# Patient Record
Sex: Female | Born: 1975 | Race: White | Hispanic: No | Smoking: Never smoker
Health system: Southern US, Community
[De-identification: ages and names within clinical notes are randomized; demographics above are authoritative.]

## PROBLEM LIST (undated history)

## (undated) ENCOUNTER — Inpatient Hospital Stay (HOSPITAL_COMMUNITY): Payer: Self-pay

## (undated) DIAGNOSIS — F32A Depression, unspecified: Secondary | ICD-10-CM

## (undated) DIAGNOSIS — I469 Cardiac arrest, cause unspecified: Secondary | ICD-10-CM

## (undated) DIAGNOSIS — N83202 Unspecified ovarian cyst, left side: Secondary | ICD-10-CM

## (undated) DIAGNOSIS — R569 Unspecified convulsions: Secondary | ICD-10-CM

## (undated) DIAGNOSIS — Z5189 Encounter for other specified aftercare: Secondary | ICD-10-CM

## (undated) DIAGNOSIS — F329 Major depressive disorder, single episode, unspecified: Secondary | ICD-10-CM

## (undated) DIAGNOSIS — F1010 Alcohol abuse, uncomplicated: Secondary | ICD-10-CM

## (undated) DIAGNOSIS — K760 Fatty (change of) liver, not elsewhere classified: Secondary | ICD-10-CM

## (undated) DIAGNOSIS — N83201 Unspecified ovarian cyst, right side: Secondary | ICD-10-CM

## (undated) DIAGNOSIS — K6289 Other specified diseases of anus and rectum: Secondary | ICD-10-CM

## (undated) DIAGNOSIS — F419 Anxiety disorder, unspecified: Secondary | ICD-10-CM

## (undated) DIAGNOSIS — D649 Anemia, unspecified: Secondary | ICD-10-CM

## (undated) HISTORY — DX: Encounter for other specified aftercare: Z51.89

## (undated) HISTORY — PX: APPENDECTOMY: SHX54

## (undated) HISTORY — DX: Major depressive disorder, single episode, unspecified: F32.9

## (undated) HISTORY — DX: Anemia, unspecified: D64.9

## (undated) HISTORY — PX: OTHER SURGICAL HISTORY: SHX169

## (undated) HISTORY — PX: OVARIAN CYST REMOVAL: SHX89

## (undated) HISTORY — DX: Fatty (change of) liver, not elsewhere classified: K76.0

## (undated) HISTORY — DX: Depression, unspecified: F32.A

## (undated) HISTORY — DX: Anxiety disorder, unspecified: F41.9

---

## 2013-09-17 ENCOUNTER — Encounter (HOSPITAL_COMMUNITY): Payer: Self-pay | Admitting: Emergency Medicine

## 2013-09-17 ENCOUNTER — Emergency Department (HOSPITAL_COMMUNITY)

## 2013-09-17 ENCOUNTER — Emergency Department (HOSPITAL_COMMUNITY)
Admission: EM | Admit: 2013-09-17 | Discharge: 2013-09-18 | Disposition: A | Discharge status: Home / Self Care | Attending: Emergency Medicine | Admitting: Emergency Medicine

## 2013-09-17 DIAGNOSIS — K6289 Other specified diseases of anus and rectum: Secondary | ICD-10-CM | POA: Insufficient documentation

## 2013-09-17 DIAGNOSIS — Z7982 Long term (current) use of aspirin: Secondary | ICD-10-CM | POA: Insufficient documentation

## 2013-09-17 DIAGNOSIS — Z880 Allergy status to penicillin: Secondary | ICD-10-CM | POA: Insufficient documentation

## 2013-09-17 DIAGNOSIS — Z3202 Encounter for pregnancy test, result negative: Secondary | ICD-10-CM | POA: Insufficient documentation

## 2013-09-17 DIAGNOSIS — Z79899 Other long term (current) drug therapy: Secondary | ICD-10-CM | POA: Insufficient documentation

## 2013-09-17 DIAGNOSIS — R109 Unspecified abdominal pain: Secondary | ICD-10-CM | POA: Insufficient documentation

## 2013-09-17 DIAGNOSIS — K625 Hemorrhage of anus and rectum: Secondary | ICD-10-CM | POA: Insufficient documentation

## 2013-09-17 DIAGNOSIS — Z8742 Personal history of other diseases of the female genital tract: Secondary | ICD-10-CM | POA: Insufficient documentation

## 2013-09-17 DIAGNOSIS — G40909 Epilepsy, unspecified, not intractable, without status epilepticus: Secondary | ICD-10-CM | POA: Insufficient documentation

## 2013-09-17 HISTORY — DX: Unspecified ovarian cyst, right side: N83.202

## 2013-09-17 HISTORY — DX: Unspecified ovarian cyst, right side: N83.201

## 2013-09-17 HISTORY — DX: Unspecified convulsions: R56.9

## 2013-09-17 HISTORY — DX: Other specified diseases of anus and rectum: K62.89

## 2013-09-17 LAB — COMPREHENSIVE METABOLIC PANEL
ALK PHOS: 61 U/L (ref 39–117)
ALT: 15 U/L (ref 0–35)
AST: 24 U/L (ref 0–37)
Albumin: 4 g/dL (ref 3.5–5.2)
BILIRUBIN TOTAL: 0.7 mg/dL (ref 0.3–1.2)
BUN: 4 mg/dL — AB (ref 6–23)
CO2: 22 meq/L (ref 19–32)
Calcium: 8.3 mg/dL — ABNORMAL LOW (ref 8.4–10.5)
Chloride: 104 mEq/L (ref 96–112)
Creatinine, Ser: 0.45 mg/dL — ABNORMAL LOW (ref 0.50–1.10)
GFR calc non Af Amer: >90 mL/min (ref >90)
GLUCOSE: 111 mg/dL — AB (ref 70–99)
Potassium: 3.3 mEq/L — ABNORMAL LOW (ref 3.7–5.3)
Sodium: 142 mEq/L (ref 137–147)
Total Protein: 7 g/dL (ref 6.0–8.3)

## 2013-09-17 LAB — PREGNANCY, URINE: Preg Test, Ur: NEGATIVE

## 2013-09-17 LAB — CBC WITH DIFFERENTIAL/PLATELET
BASOS PCT: 1 % (ref 0–1)
Basophils Absolute: 0 10*3/uL (ref 0.0–0.1)
EOS ABS: 0 10*3/uL (ref 0.0–0.7)
EOS PCT: 0 % (ref 0–5)
HEMATOCRIT: 33 % — AB (ref 36.0–46.0)
Hemoglobin: 11.2 g/dL — ABNORMAL LOW (ref 12.0–15.0)
Lymphocytes Relative: 48 % — ABNORMAL HIGH (ref 12–46)
Lymphs Abs: 1.5 10*3/uL (ref 0.7–4.0)
MCH: 31.2 pg (ref 26.0–34.0)
MCHC: 33.9 g/dL (ref 30.0–36.0)
MCV: 91.9 fL (ref 78.0–100.0)
MONO ABS: 0.3 10*3/uL (ref 0.1–1.0)
Monocytes Relative: 8 % (ref 3–12)
Neutro Abs: 1.4 10*3/uL — ABNORMAL LOW (ref 1.7–7.7)
Neutrophils Relative %: 43 % (ref 43–77)
Platelets: 179 10*3/uL (ref 150–400)
RBC: 3.59 MIL/uL — ABNORMAL LOW (ref 3.87–5.11)
RDW: 14.8 % (ref 11.5–15.5)
WBC: 3.1 10*3/uL — ABNORMAL LOW (ref 4.0–10.5)

## 2013-09-17 LAB — URINE MICROSCOPIC-ADD ON

## 2013-09-17 LAB — URINALYSIS, ROUTINE W REFLEX MICROSCOPIC
BILIRUBIN URINE: NEGATIVE
GLUCOSE, UA: NEGATIVE mg/dL
Ketones, ur: NEGATIVE mg/dL
Leukocytes, UA: NEGATIVE
Nitrite: NEGATIVE
PROTEIN: NEGATIVE mg/dL
Specific Gravity, Urine: 1.022 (ref 1.005–1.030)
Urobilinogen, UA: 1 mg/dL (ref 0.0–1.0)
pH: 7 (ref 5.0–8.0)

## 2013-09-17 LAB — LIPASE, BLOOD: Lipase: 37 U/L (ref 11–59)

## 2013-09-17 MED ORDER — ONDANSETRON HCL 4 MG/2ML IJ SOLN
4.0000 mg | Freq: Once | INTRAMUSCULAR | Status: AC
  Administered 2013-09-17: 4 mg via INTRAVENOUS
  Filled 2013-09-17: qty 2

## 2013-09-17 MED ORDER — IOHEXOL 300 MG/ML  SOLN
100.0000 mL | Freq: Once | INTRAMUSCULAR | Status: AC | PRN
  Administered 2013-09-17: 100 mL via INTRAVENOUS

## 2013-09-17 MED ORDER — IOHEXOL 300 MG/ML  SOLN
50.0000 mL | Freq: Once | INTRAMUSCULAR | Status: AC | PRN
  Administered 2013-09-17: 50 mL via ORAL

## 2013-09-17 MED ORDER — HYDROMORPHONE HCL PF 1 MG/ML IJ SOLN
1.0000 mg | Freq: Once | INTRAMUSCULAR | Status: AC
  Administered 2013-09-17: 1 mg via INTRAVENOUS
  Filled 2013-09-17: qty 1

## 2013-09-17 MED ORDER — OXYCODONE-ACETAMINOPHEN 5-325 MG PO TABS: 1.0000 | ORAL_TABLET | Freq: Four times a day (QID) | ORAL | Status: DC | PRN

## 2013-09-17 MED ORDER — CIPROFLOXACIN HCL 500 MG PO TABS: 500.0000 mg | ORAL_TABLET | Freq: Two times a day (BID) | ORAL | Status: DC

## 2013-09-17 MED ORDER — SODIUM CHLORIDE 0.9 % IV BOLUS (SEPSIS)
1000.0000 mL | Freq: Once | INTRAVENOUS | Status: AC
  Administered 2013-09-17: 1000 mL via INTRAVENOUS

## 2013-09-17 MED ORDER — ONDANSETRON 4 MG PO TBDP: ORAL_TABLET | ORAL | Status: DC

## 2013-09-17 MED ORDER — LORAZEPAM 2 MG/ML IJ SOLN
0.5000 mg | Freq: Once | INTRAMUSCULAR | Status: AC
  Administered 2013-09-17: 0.5 mg via INTRAVENOUS
  Filled 2013-09-17: qty 1

## 2013-09-17 MED ORDER — HYDROMORPHONE HCL PF 1 MG/ML IJ SOLN
1.0000 mg | Freq: Once | INTRAMUSCULAR | Status: AC
Start: 2013-09-17 — End: 2013-09-17
  Administered 2013-09-17: 1 mg via INTRAVENOUS
  Filled 2013-09-17: qty 1

## 2013-09-17 NOTE — Discharge Instructions (Signed)
Follow up with Dr. Teena Irani or any of his partners at [facility]   [phone]

## 2013-09-17 NOTE — ED Provider Notes (Signed)
CSN: [id number]     Arrival date & time 09/17/13  1726 History   First MD Initiated Contact with Patient 09/17/13 1926     Chief Complaint  Patient presents with  . Abdominal Pain     (Consider location/radiation/quality/duration/timing/severity/associated sxs/prior Treatment) Patient is a 38 y.o. female presenting with abdominal pain. The history is provided by the patient (the pt complains of abd pain and rectal bleeding.  she was worked up for this before with colonoscopy.  no dx according to pt).  Abdominal Pain Pain location:  Suprapubic Pain quality: aching   Pain radiates to:  Does not radiate Pain severity:  Moderate Onset quality:  Gradual Timing:  Constant Progression:  Waxing and waning Chronicity:  Recurrent Associated symptoms: no chest pain, no cough, no diarrhea, no fatigue and no hematuria     Past Medical History  Diagnosis Date  . Proctitis   . Cysts of both ovaries   . Seizures    Past Surgical History  Procedure Laterality Date  . Ovarian cyst removal     No family history on file. History  Substance Use Topics  . Smoking status: Never Smoker   . Smokeless tobacco: Not on file  . Alcohol Use: Yes   OB History   Grav Para Term Preterm Abortions TAB SAB Ect Mult Living                 Review of Systems  Constitutional: Negative for appetite change and fatigue.  HENT: Negative for congestion, ear discharge and sinus pressure.   Eyes: Negative for discharge.  Respiratory: Negative for cough.   Cardiovascular: Negative for chest pain.  Gastrointestinal: Positive for abdominal pain, anal bleeding and rectal pain. Negative for diarrhea.  Genitourinary: Negative for frequency and hematuria.  Musculoskeletal: Negative for back pain.  Skin: Negative for rash.  Neurological: Negative for seizures and headaches.  Psychiatric/Behavioral: Negative for hallucinations.      Allergies  Morphine and related; Penicillins; and Tramadol  Home Medications    Current Outpatient Rx  Name  Route  Sig  Dispense  Refill  . aspirin 325 MG tablet   Oral   Take 650 mg by mouth every 4 (four) hours as needed for mild pain.         . citalopram (CELEXA) 20 MG tablet   Oral   Take 20 mg by mouth daily.         Marland Kitchen gabapentin (NEURONTIN) 300 MG capsule   Oral   Take 300 mg by mouth 3 (three) times daily.         . medroxyPROGESTERone (DEPO-PROVERA) 150 MG/ML injection   Intramuscular   Inject 150 mg into the muscle every 3 (three) months.         . traZODone (DESYREL) 100 MG tablet   Oral   Take 100 mg by mouth at bedtime as needed for sleep.         . ciprofloxacin (CIPRO) 500 MG tablet   Oral   Take 1 tablet (500 mg total) by mouth 2 (two) times daily. One po bid x 7 days   14 tablet   0   . ondansetron (ZOFRAN ODT) 4 MG disintegrating tablet      4mg  ODT q4-6 hours prn nausea/vomit   15 tablet   0   . oxyCODONE-acetaminophen (PERCOCET/ROXICET) 5-325 MG per tablet   Oral   Take 1 tablet by mouth every 6 (six) hours as needed for severe pain.   30 tablet  0    BP 146/95  Pulse 85  Temp(Src) 97.7 F (36.5 C) (Oral)  Resp 20  SpO2 97%  LMP 08/27/2013 Physical Exam  Constitutional: She is oriented to person, place, and time. She appears well-developed.  HENT:  Head: Normocephalic.  Eyes: Conjunctivae and EOM are normal. No scleral icterus.  Neck: Neck supple. No thyromegaly present.  Cardiovascular: Normal rate and regular rhythm.  Exam reveals no gallop and no friction rub.   No murmur heard. Pulmonary/Chest: No stridor. She has no wheezes. She has no rales. She exhibits no tenderness.  Abdominal: She exhibits no distension. There is no tenderness. There is no rebound.  Genitourinary:  Anus tender with hemmorhoids.  Could not put finer in anus 2nd to pain   Musculoskeletal: Normal range of motion. She exhibits no edema.  Lymphadenopathy:    She has no cervical adenopathy.  Neurological: She is oriented to  person, place, and time. She exhibits normal muscle tone. Coordination normal.  Skin: No rash noted. No erythema.  Psychiatric: She has a normal mood and affect. Her behavior is normal.    ED Course  Procedures (including critical care time) Labs Review Labs Reviewed  CBC WITH DIFFERENTIAL - Abnormal; Notable for the following:    WBC 3.1 (*)    RBC 3.59 (*)    Hemoglobin 11.2 (*)    HCT 33.0 (*)    Neutro Abs 1.4 (*)    Lymphocytes Relative 48 (*)    All other components within normal limits  COMPREHENSIVE METABOLIC PANEL - Abnormal; Notable for the following:    Potassium 3.3 (*)    Glucose, Bld 111 (*)    BUN 4 (*)    Creatinine, Ser 0.45 (*)    Calcium 8.3 (*)    All other components within normal limits  URINALYSIS, ROUTINE W REFLEX MICROSCOPIC - Abnormal; Notable for the following:    APPearance CLOUDY (*)    Hgb urine dipstick MODERATE (*)    All other components within normal limits  URINE MICROSCOPIC-ADD ON - Abnormal; Notable for the following:    Squamous Epithelial / LPF MANY (*)    All other components within normal limits  LIPASE, BLOOD  PREGNANCY, URINE   Imaging Review Ct Abdomen Pelvis W Contrast  09/17/2013   CLINICAL DATA:  . History of ovarian cysts and proctitis. Rectal bleeding.  EXAM: CT ABDOMEN AND PELVIS WITH CONTRAST  TECHNIQUE: Multidetector CT imaging of the abdomen and pelvis was performed using the standard protocol following bolus administration of intravenous contrast.  CONTRAST:  136mL OMNIPAQUE IOHEXOL 300 MG/ML  SOLN  COMPARISON:  None.  FINDINGS: BODY WALL: Diastasis rectus.  LOWER CHEST: Unremarkable.  ABDOMEN/PELVIS:  Liver: Suspect fatty infiltration of the liver, especially around the gallbladder fossa.  Biliary: No evidence of biliary obstruction or stone.  Pancreas: Unremarkable.  Spleen: Unremarkable.  Adrenals: Unremarkable.  Kidneys and ureters: No hydronephrosis or stone.  Bladder: Unremarkable.  Reproductive: Small volume fluid in the  upper vagina.  Bowel: No obstruction. No evidence of proctitis currently. Normal appendix.  Retroperitoneum: No mass or adenopathy.  Peritoneum: Small volume fluid within the pelvic peritoneal recesses, usually physiologic.  Vascular: No acute abnormality.  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  IMPRESSION: 1. No acute intra-abdominal abnormality. 2. L5 pars defects with significant anterolisthesis.   Electronically Signed   By: Jorje Guild M.D.   On: 09/17/2013 23:25  EKG Interpretation None      MDM   Final diagnoses:  Abdominal pain    Pt to follow up with gi    Maudry Diego, MD 09/17/13 2358

## 2013-09-17 NOTE — ED Notes (Signed)
Pt from home reports that she has ovarian cysts and Proctitis. Pt c/o severe pain and rectal bleeding x2 days. Pt difficult to triage and not compliant with answers. Pt is A&O

## 2013-09-18 MED ORDER — ONDANSETRON HCL 4 MG/2ML IJ SOLN
4.0000 mg | Freq: Once | INTRAMUSCULAR | Status: AC
  Administered 2013-09-18: 4 mg via INTRAVENOUS
  Filled 2013-09-18: qty 2

## 2013-09-21 ENCOUNTER — Ambulatory Visit (INDEPENDENT_AMBULATORY_CARE_PROVIDER_SITE_OTHER): Admitting: Gynecology

## 2013-09-21 ENCOUNTER — Other Ambulatory Visit: Payer: Self-pay | Admitting: Gynecology

## 2013-09-21 ENCOUNTER — Ambulatory Visit (INDEPENDENT_AMBULATORY_CARE_PROVIDER_SITE_OTHER): Admitting: Emergency Medicine

## 2013-09-21 ENCOUNTER — Ambulatory Visit (INDEPENDENT_AMBULATORY_CARE_PROVIDER_SITE_OTHER)

## 2013-09-21 ENCOUNTER — Encounter: Payer: Self-pay | Admitting: Gynecology

## 2013-09-21 VITALS — BP 114/76 | Ht 66.0 in | Wt 133.0 lb

## 2013-09-21 VITALS — BP 102/64 | HR 84 | Temp 97.6°F | Resp 16 | Ht 66.0 in | Wt 131.0 lb

## 2013-09-21 DIAGNOSIS — N949 Unspecified condition associated with female genital organs and menstrual cycle: Secondary | ICD-10-CM

## 2013-09-21 DIAGNOSIS — N920 Excessive and frequent menstruation with regular cycle: Secondary | ICD-10-CM

## 2013-09-21 DIAGNOSIS — D649 Anemia, unspecified: Secondary | ICD-10-CM

## 2013-09-21 DIAGNOSIS — R102 Pelvic and perineal pain unspecified side: Secondary | ICD-10-CM

## 2013-09-21 DIAGNOSIS — K625 Hemorrhage of anus and rectum: Secondary | ICD-10-CM

## 2013-09-21 DIAGNOSIS — N946 Dysmenorrhea, unspecified: Secondary | ICD-10-CM

## 2013-09-21 DIAGNOSIS — K6289 Other specified diseases of anus and rectum: Secondary | ICD-10-CM | POA: Insufficient documentation

## 2013-09-21 DIAGNOSIS — Z8742 Personal history of other diseases of the female genital tract: Secondary | ICD-10-CM | POA: Insufficient documentation

## 2013-09-21 LAB — POCT CBC
Granulocyte percent: 46.5 %G (ref 37–80)
HEMATOCRIT: 34.7 % — AB (ref 37.7–47.9)
Hemoglobin: 10.9 g/dL — AB (ref 12.2–16.2)
LYMPH, POC: 1.2 (ref 0.6–3.4)
MCH: 30.1 pg (ref 27–31.2)
MCHC: 31.4 g/dL — AB (ref 31.8–35.4)
MCV: 95.9 fL (ref 80–97)
MID (cbc): 0.2 (ref 0–0.9)
MPV: 6.6 fL (ref 0–99.8)
POC GRANULOCYTE: 1.2 — AB (ref 2–6.9)
POC LYMPH %: 46.1 % (ref 10–50)
POC MID %: 7.4 %M (ref 0–12)
Platelet Count, POC: 186 10*3/uL (ref 142–424)
RBC: 3.62 M/uL — AB (ref 4.04–5.48)
RDW, POC: 15.9 %
WBC: 2.5 10*3/uL — AB (ref 4.6–10.2)

## 2013-09-21 MED ORDER — KETOROLAC TROMETHAMINE 10 MG PO TABS: 10.0000 mg | ORAL_TABLET | Freq: Four times a day (QID) | ORAL | Status: DC | PRN

## 2013-09-21 MED ORDER — KETOROLAC TROMETHAMINE 60 MG/2ML IJ SOLN: 60.0000 mg | Freq: Once | INTRAMUSCULAR | Status: DC

## 2013-09-21 MED ORDER — KETOROLAC TROMETHAMINE 60 MG/2ML IM SOLN
60.0000 mg | Freq: Once | INTRAMUSCULAR | Status: AC
  Administered 2013-09-21: 60 mg via INTRAMUSCULAR

## 2013-09-21 MED ORDER — NORETHIN ACE-ETH ESTRAD-FE 1-20 MG-MCG PO TABS: 1.0000 | ORAL_TABLET | Freq: Every day | ORAL | Status: DC

## 2013-09-21 NOTE — Patient Instructions (Addendum)
Transvaginal Ultrasound Transvaginal ultrasound is a pelvic ultrasound, using a metal probe that is placed in the vagina, to look at a women's female organs. Transvaginal ultrasound is a method of seeing inside the pelvis of a woman. The ultrasound machine sends out sound waves from the transducer (probe). These sound waves bounce off body structures (like an echo) to create a picture. The picture shows up on a monitor. It is called transvaginal because the probe is inserted into the vagina. There should be very little discomfort from the vaginal probe. This test can also be used during pregnancy. Endovaginal ultrasound is another name for a transvaginal ultrasound. In a transabdominal ultrasound, the probe is placed on the outside of the belly. This method gives pictures that are lower quality than pictures from the transvaginal technique. Transvaginal ultrasound is used to look for problems of the female genital tract. Some such problems include:  Infertility problems.  Congenital (birth defect) malformations of the uterus and ovaries.  Tumors in the uterus.  Abnormal bleeding.  Ovarian tumors and cysts.  Abscess (inflamed tissue around pus) in the pelvis.  Unexplained abdominal or pelvic pain.  Pelvic infection. DURING PREGNANCY, TRANSVAGINAL ULTRASOUND MAY BE USED TO LOOK AT:  Normal pregnancy.  Ectopic pregnancy (pregnancy outside the uterus).  Fetal heartbeat.  Abnormalities in the pelvis, that are not seen well with transabdominal ultrasound.  Suspected twins or multiples.  Impending miscarriage.  Problems with the cervix (incompetent cervix, not able to stay closed and hold the baby).  When doing an amniocentesis (removing fluid from the pregnancy sac, for testing).  Looking for abnormalities of the baby.  Checking the growth, development, and age of the fetus.  Measuring the amount of fluid in the amniotic sac.  When doing an external version of the baby (moving  baby into correct position).  Evaluating the baby for problems in high risk pregnancies (biophysical profile).  Suspected fetal demise (death). Sometimes a special ultrasound method called Saline Infusion Sonography (SIS) is used for a more accurate look at the uterus. Sterile saline (salt water) is injected into the uterus of non-pregnant patients to see the inside of the uterus better. SIS is not used on pregnant women. The vaginal probe can also assist in obtaining biopsies of abnormal areas, in draining fluid from cysts on the ovary, and in finding IUDs (intrauterine device, birth control) that cannot be located. PREPARATION FOR TEST A transvaginal ultrasound is done with the bladder empty. The transabdominal ultrasound is done with your bladder full. You may be asked to drink several glasses of water before that exam. Sometimes, a transabdominal ultrasound is done just after a transvaginal ultrasound, to look at organs in your abdomen. PROCEDURE  You will lie down on a table, with your knees bent and your feet in foot holders. The probe is covered with a condom. A sterile lubricant is put into the vagina and on the probe. The lubricant helps transmit the sound waves and avoid irritating the vagina. Your caregiver will move the probe inside the vaginal cavity to scan the pelvic structures. A normal test will show a normal pelvis and normal contents. An abnormal test will show abnormalities of the pelvis, placenta, or baby. ABNORMAL RESULTS MAY BE DUE TO:  Growths or tumors in the:  Uterus.  Ovaries.  Vagina.  Other pelvic structures.  Non-cancerous growths of the uterus and ovaries.  Twisting of the ovary, cutting off blood supply to the ovary (ovarian torsion).  Areas of infection, including:  Pelvic  inflammatory disease.  Abscess in the pelvis.  Locating an IUD. PROBLEMS FOUND IN PREGNANT WOMEN MAY INCLUDE:  Ectopic pregnancy (pregnancy outside the uterus).  Multiple  pregnancies.  Early dilation (opening) of the cervix. This may indicate an incompetent cervix and early delivery.  Impending miscarriage.  Fetal death.  Problems with the placenta, including:  Placenta has grown over the opening of the womb (placenta previa).  Placenta has separated early in the womb (placental abruption).  Placenta grows into the muscle of the uterus (placenta accreta).  Tumors of pregnancy, including gestational trophoblastic disease. This is an abnormal pregnancy, with no fetus. The uterus is filled with many grape-like cysts that could sometimes be cancerous.  Incorrect position of the fetus (breech, vertex).  Intrauterine fetal growth retardation (IUGR) (poor growth in the womb).  Fetal abnormalities or infection. RISKS AND COMPLICATIONS There are no known risks to the ultrasound procedure. There is no X-ray used when doing an ultrasound. Document Released: 06/17/2004 Document Revised: 09/28/2011 Document Reviewed: 06/05/2009 [facility] Patient Information 2014 [place], [place]. Diagnostic Laparoscopy Laparoscopy is a surgical procedure. It is used to diagnose and treat diseases inside the belly (abdomen). It is usually a brief, common, and relatively simple procedure. The laparoscopeis a thin, lighted, pencil-sized instrument. It is like a telescope. It is inserted into your abdomen through a small cut (incision). Your caregiver can look at the organs inside your body through this instrument. He or she can see if there is anything abnormal. Laparoscopy can be done either in a hospital or outpatient clinic. You may be given a mild sedative to help you relax before the procedure. Once in the operating room, you will be given a drug to make you sleep (general anesthesia). Laparoscopy usually lasts less than 1 hour. After the procedure, you will be monitored in a recovery area until you are stable and doing well. Once you are home, it will take 2 to 3 days to fully  recover. RISKS AND COMPLICATIONS  Laparoscopy has relatively few risks. Your caregiver will discuss the risks with you before the procedure. Some problems that can occur include:  Infection.  Bleeding.  Damage to other organs.  Anesthetic side effects. PROCEDURE Once you receive anesthesia, your surgeon inflates the abdomen with a harmless gas (carbon dioxide). This makes the organs easier to see. The laparoscope is inserted into the abdomen through a small incision. This allows your surgeon to see into the abdomen. Other small instruments are also inserted into the abdomen through other small openings. Many surgeons attach a video camera to the laparoscope to enlarge the view. During a diagnostic laparoscopy, the surgeon may be looking for inflammation, infection, or cancer. Your surgeon may take tissue samples(biopsies). The samples are sent to a specialist in looking at cells and tissue samples (pathologist). The pathologist examines them under a microscope. Biopsies can help to diagnose or confirm a disease. AFTER THE PROCEDURE   The gas is released from inside the abdomen.  The incisions are closed with stitches (sutures). Because these incisions are small (usually less than 1/2 inch), there is usually minimal discomfort after the procedure. There may be some mild discomfort in the throat. This is from the tube placed in the throat while you were sleeping. You may have some mild abdominal discomfort. There may also be discomfort from the instrument placement incisions in the abdomen.  The recovery time is shortened as long as there are no complications.  You will rest in a recovery room until stable and  doing well. As long as there are no complications, you may be allowed to go home. FINDING OUT THE RESULTS OF YOUR TEST Not all test results are available during your visit. If your test results are not back during the visit, make an appointment with your caregiver to find out the  results. Do not assume everything is normal if you have not heard from your caregiver or the medical facility. It is important for you to follow up on all of your test results. HOME CARE INSTRUCTIONS   Take all medicines as directed.  Only take over-the-counter or prescription medicines for pain, discomfort, or fever as directed by your caregiver.  Resume daily activities as directed.  Showers are preferred over baths.  You may resume sexual activities in 1 week or as directed.  Do not drive while taking narcotics. SEEK MEDICAL CARE IF:   There is increasing abdominal pain.  There is new pain in the shoulders (shoulder strap areas).  You feel lightheaded or faint.  You have the chills.  You or your child has an oral temperature above 102 F (38.9 C).  There is pus-like (purulent) drainage from any of the wounds.  You are unable to pass gas or have a bowel movement.  You feel sick to your stomach (nauseous) or throw up (vomit). MAKE SURE YOU:   Understand these instructions.  Will watch your condition.  Will get help right away if you are not doing well or get worse. Document Released: 10/12/2000 Document Revised: 10/31/2012 Document Reviewed: 07/06/2007 [facility] Patient Information 2014 [place], [place]. Oral Contraception Use Oral contraceptive pills (OCPs) are medicines taken to prevent pregnancy. OCPs work by preventing the ovaries from releasing eggs. The hormones in OCPs also cause the cervical mucus to thicken, preventing the sperm from entering the uterus. The hormones also cause the uterine lining to become thin, not allowing a fertilized egg to attach to the inside of the uterus. OCPs are highly effective when taken exactly as prescribed. However, OCPs do not prevent sexually transmitted diseases (STDs). Safe sex practices, such as using condoms along with an OCP, can help prevent STDs. Before taking OCPs, you may have a physical exam and Pap test. Your health care  provider may also order blood tests if necessary. Your health care provider will make sure you are a good candidate for oral contraception. Discuss with your health care provider the possible side effects of the OCP you may be prescribed. When starting an OCP, it can take 2 to 3 months for the body to adjust to the changes in hormone levels in your body.  HOW TO TAKE ORAL CONTRACEPTIVE PILLS Your health care provider may advise you on how to start taking the first cycle of OCPs. Otherwise, you can:   Start on day 1 of your menstrual period. You will not need any backup contraceptive protection with this start time.   Start on the first Sunday after your menstrual period or the day you get your prescription. In these cases, you will need to use backup contraceptive protection for the first week.   Start the pill at any time of your cycle. If you take the pill within 5 days of the start of your period, you are protected against pregnancy right away. In this case, you will not need a backup form of birth control. If you start at any other time of your menstrual cycle, you will need to use another form of birth control for 7 days. If your OCP  is the type called a minipill, it will protect you from pregnancy after taking it for 2 days (48 hours). After you have started taking OCPs:   If you forget to take 1 pill, take it as soon as you remember. Take the next pill at the regular time.   If you miss 2 or more pills, call your health care provider because different pills have different instructions for missed doses. Use backup birth control until your next menstrual period starts.   If you use a 28-day pack that contains inactive pills and you miss 1 of the last 7 pills (pills with no hormones), it will not matter. Throw away the rest of the nonhormone pills and start a new pill pack.  No matter which day you start the OCP, you will always start a new pack on that same day of the week. Have an extra  pack of OCPs and a backup contraceptive method available in case you miss some pills or lose your OCP pack.  HOME CARE INSTRUCTIONS   Do not smoke.   Always use a condom to protect against STDs. OCPs do not protect against STDs.   Use a calendar to mark your menstrual period days.   Read the information and directions that came with your OCP. Talk to your health care provider if you have questions.  SEEK MEDICAL CARE IF:   You develop nausea and vomiting.   You have abnormal vaginal discharge or bleeding.   You develop a rash.   You miss your menstrual period.   You are losing your hair.   You need treatment for mood swings or depression.   You get dizzy when taking the OCP.   You develop acne from taking the OCP.   You become pregnant.  SEEK IMMEDIATE MEDICAL CARE IF:   You develop chest pain.   You develop shortness of breath.   You have an uncontrolled or severe headache.   You develop numbness or slurred speech.   You develop visual problems.   You develop pain, redness, and swelling in the legs.  Document Released: 06/25/2011 Document Revised: 03/08/2013 Document Reviewed: 12/25/2012 [facility] Patient Information 2014 [place], [place]. Endometriosis Endometriosis is a condition in which the tissue that lines the uterus (endometrium) grows outside of its normal location. The tissue may grow in many locations close to the uterus, but it commonly grows on the ovaries, fallopian tubes, vagina, or bowel. Because the uterus expels, or sheds, its lining every menstrual cycle, there is bleeding wherever the endometrial tissue is located. This can cause pain because blood is irritating to tissues not normally exposed to it.  CAUSES  The cause of endometriosis is not known.  SIGNS AND SYMPTOMS  Often, there are no symptoms. When symptoms are present, they can vary with the location of the displaced tissue. Various symptoms can occur at different times.  Although symptoms occur mainly during a woman's menstrual period, they can also occur midcycle and usually stop with menopause. Some people may go months with no symptoms at all. Symptoms may include:   Back or abdominal pain.   Heavier bleeding during periods.   Pain during intercourse.   Painful bowel movements.   Infertility. DIAGNOSIS  Your health care provider will do a physical exam and ask about your symptoms. Various tests may be done, such as:   Blood tests and urine tests. These are done to help rule out other problems.   Ultrasound. This test is done to look for  abnormal tissue.   An X-ray of the lower bowel (barium enema).  Laparoscopy. In this procedure, a thin, lighted tube with a tiny camera on the end (laparoscope) is inserted into your abdomen. This helps your health care provider look for abnormal tissue to confirm the diagnosis. The health care provider may also remove a small piece of tissue (biopsy) from any abnormal tissue found. This tissue sample can then be sent to a lab so it can be looked at under a microscope. TREATMENT  Treatment will vary and may include:   Medicines to relieve pain. Nonsteroidal anti-inflammatory drugs (NSAIDs) are a type of pain medicine that can help to relieve the pain caused by endometriosis.  Hormonal therapy. When using hormonal therapy, periods are eliminated. This eliminates the monthly exposure to blood by the displaced endometrial tissue.   Surgery. Surgery may sometimes be done to remove the abnormal endometrial tissue. In severe cases, surgery may be done to remove the fallopian tubes, uterus, and ovaries (hysterectomy). HOME CARE INSTRUCTIONS   Only take over-the-counter or prescription medicines for pain, discomfort, or fever as directed by your health care provider. Do not take aspirin because it may increase bleeding when you are not on hormonal therapy.   Avoid activities that produce pain, including sexual  activity. SEEK MEDICAL CARE IF:  You have pelvic pain before, after, or during your periods.  You have pelvic pain between periods that gets worse during your period.  You have pelvic pain during or after sex.  You have pelvic pain with bowel movements or urination, especially during your period.  You have problems getting pregnant. SEEK IMMEDIATE MEDICAL CARE IF:   Your pain is severe and is not responding to pain medicine.   You have severe nausea and vomiting, or you cannot keep foods down.   You have pain that is limited to the right lower part of your abdomen.   You have swelling or increasing pain in your abdomen.   You see blood in your stool.   You have a fever or persistent symptoms for more than 2 3 days.   You have a fever and your symptoms suddenly get worse. MAKE SURE YOU:   Understand these instructions.  Will watch your condition.  Will get help right away if you are not doing well or get worse. Document Released: 07/03/2000 Document Revised: 04/26/2013 Document Reviewed: 03/03/2013 [facility] Patient Information 2014 [place].

## 2013-09-21 NOTE — Progress Notes (Addendum)
[facility] [address] [place] [address] [phone]  Date:  09/21/2013   Name:  Kristen Roberson   DOB:  02/29/1976   MRN:  [id number]  PCP:  No PCP Per Patient    Chief Complaint: Abdominal Pain   History of Present Illness:  Kristen Roberson is a 38 y.o. very pleasant female patient who presents with the following:  Has history of right multiple ovarian cysts.  In [place] ER on Sunday with increased pain. Now has pain in RLQ not responding to percocet.  No nausea or vomiting. No blood per rectum or black stools. No vomiting blood. No fever or chills.  LMP just finished.  [id number].  Menses are irregular over past couple years. No improvement with over the counter medications or other home remedies. Denies other complaint or health concern today.   There are no active problems to display for this patient.   Past Medical History  Diagnosis Date  . Proctitis   . Cysts of both ovaries   . Seizures   . Anemia   . Anxiety   . Blood transfusion without reported diagnosis   . Depression     Past Surgical History  Procedure Laterality Date  . Ovarian cyst removal      History  Substance Use Topics  . Smoking status: Never Smoker   . Smokeless tobacco: Not on file  . Alcohol Use: Yes    Family History  Problem Relation Age of Onset  . Diabetes Mother   . Hyperlipidemia Mother   . Stroke Mother   . Diabetes Father     Allergies  Allergen Reactions  . Morphine And Related     Anaphylaxis   . Penicillins     unknown  . Tramadol     Seizures     Medication list has been reviewed and updated.  Current Outpatient Prescriptions on File Prior to Visit  Medication Sig Dispense Refill  . ciprofloxacin (CIPRO) 500 MG tablet Take 1 tablet (500 mg total) by mouth 2 (two) times daily. One po bid x 7 days  14 tablet  0  . medroxyPROGESTERone (DEPO-PROVERA) 150 MG/ML injection Inject 150 mg into the muscle every 3 (three) months.      . ondansetron  (ZOFRAN ODT) 4 MG disintegrating tablet 4mg  ODT q4-6 hours prn nausea/vomit  15 tablet  0  . oxyCODONE-acetaminophen (PERCOCET/ROXICET) 5-325 MG per tablet Take 1 tablet by mouth every 6 (six) hours as needed for severe pain.  30 tablet  0  . traZODone (DESYREL) 100 MG tablet Take 100 mg by mouth at bedtime as needed for sleep.      Marland Kitchen aspirin 325 MG tablet Take 650 mg by mouth every 4 (four) hours as needed for mild pain.      . citalopram (CELEXA) 20 MG tablet Take 20 mg by mouth daily.      Marland Kitchen gabapentin (NEURONTIN) 300 MG capsule Take 300 mg by mouth 3 (three) times daily.       No current facility-administered medications on file prior to visit.    Review of Systems:  As per HPI, otherwise negative.    Physical Examination: Filed Vitals:   09/21/13 1218  BP: 102/64  Pulse: 84  Temp: 97.6 F (36.4 C)  Resp: 16   Filed Vitals:   09/21/13 1218  Height: 5\' 6"  (1.676 m)  Weight: 131 lb (59.421 kg)   Body mass index is 21.15 kg/(m^2). Ideal Body Weight:  Weight in (lb) to have BMI = 25: 154.6  GEN: WDWN, NAD, Non-toxic, A & O x 3 HEENT: Atraumatic, Normocephalic. Neck supple. No masses, No LAD. Ears and Nose: No external deformity. CV: RRR, No M/G/R. No JVD. No thrill. No extra heart sounds. PULM: CTA B, no wheezes, crackles, rhonchi. No retractions. No resp. distress. No accessory muscle use. ABD: S, extremely tender right LQ, ND, +BS. No rebound. No HSM. EXTR: No c/c/e NEURO Normal gait.  PSYCH: Normally interactive. Conversant. Not depressed or anxious appearing.  Calm demeanor.    Assessment and Plan: Pelvic pain To OB/GYN  Signed,  Ellison Carwin, MD  Results for orders placed in visit on 09/21/13  POCT CBC      Result Value Ref Range   WBC 2.5 (*) 4.6 - 10.2 K/uL   Lymph, poc 1.2  0.6 - 3.4   POC LYMPH PERCENT 46.1  10 - 50 %L   MID (cbc) 0.2  0 - 0.9   POC MID % 7.4  0 - 12 %M   POC Granulocyte 1.2 (*) 2 - 6.9   Granulocyte percent 46.5  37 - 80 %G    RBC 3.62 (*) 4.04 - 5.48 M/uL   Hemoglobin 10.9 (*) 12.2 - 16.2 g/dL   HCT, POC 34.7 (*) 37.7 - 47.9 %   MCV 95.9  80 - 97 fL   MCH, POC 30.1  27 - 31.2 pg   MCHC 31.4 (*) 31.8 - 35.4 g/dL   RDW, POC 15.9     Platelet Count, POC 186  142 - 424 K/uL   MPV 6.6  0 - 99.8 fL   d

## 2013-09-21 NOTE — Patient Instructions (Signed)
We have scheduled you an appointment for today 09/21/2013 with [facility]. Your appointment time will be at 3PM with Dr. Toney Rakes. Please arrive at 230PM to complete needed paperwork. Also, bring along your insurance card and list of current medications.  The office address is: [place]

## 2013-09-22 ENCOUNTER — Telehealth: Payer: Self-pay | Admitting: *Deleted

## 2013-09-22 NOTE — Progress Notes (Signed)
   Patient is a 38 year old gravida 7 para 3 AB 4 (2 spontaneous AB and 2 elective terminations) who was referred to our practice as a courtesy of Dr. Synetta Shadow in reference to patient's right lower quadrant abdominal pains. Patient initially had reported to [facility] emergency room March 1 complaining of excruciating right lower quadrant pain which had not improved with Percocet. She stated that her last menstrual period was 4 days prior. She was currently not using any form of contraception. She denied any nausea, fever, or chills. Patient does report irregular cycles for the past few years. It was assumed that she was on a Depo-Provera injection but states that she never went in received any  injections. Patient does state that she has had history of ovarian cysts in the past in many years ago she had laparoscopic right ovarian cystectomy. She states she has no other medical problems and has been healthy otherwise. She had reported earlier today a gush of vaginal bleeding right before her cramping started. Patient had the following blood tests and CT done in the ED:  CBC: White blood count 3.1, hemoglobin 11.2 and hematocrit 33. 0 Urine pregnancy test: Negative UA moderate hemoglobin otherwise negative for infection CT of abdomen and pelvis:IMPRESSION:  1. No acute intra-abdominal abnormality.  2. L5 pars defects with significant anterolisthesis.   She l presented earlier today March 5 to Dr. Tonette Bihari office with recurrence of the right lower quadrant pain and he referred her to our office for further evaluation. Patient had stable vital signs with a blood pressure 102/64, pulse 84, temperature 97.6.  He repeated her CBC with a fall result White blood count 2.5, hemoglobin 10.9, hematocrit 34.7, platelet count 186,000  She presented to the office she was complaining of intermittent right lower, pain. She received Toradol 60 mg IM. On abdominal exam she was exquisitely tender in the  right lower quadrant pelvic exam was very limited so an ultrasound was done here in our office. The ultrasound demonstrated the following:  The uterus measured 10.0 x 6.4 x 5.0 cm with an endometrial stripe of 7.2 mm. Right and left lower were normal no apparent masses were seen on either adnexa. No free fluid was seen the cul-de-sac prominent endometrial cavity avascular otherwise normal.  Assessment/plan: Patient with history of severe dysmenorrhea and menorrhagia. Symptomatology may be attributed to underlying endometriosis. Normal ultrasound and CT scan. Slightly anemic will be started on iron supplementation. She does not wish to go on a Depo-Provera injection so she will be started on oral contraceptive pills to begin today. I have asked her to return back to the office in one month for followup. I have given her literature and information diagnostic laparoscopy as well as on endometriosis. She was prescribed Toradol 10 mg 1 by mouth every 6 hours for the next 3-5 days that she can take intermittently as needed with the Percocet that was prescribed in the ED. Patient with history of proctitis has had rectal bleeding in the past and she has not seen by a gastroenterologist here in [place] and thus will be referred to one of our gastroenterologist for further assessment as well. Patient was counseled on the risk and benefits and pros and cons of oral contraceptive pill. She will be placed on a 20 mcg pill. She has taken oral contraceptive pills many years ago without any problems. She denies any personal or family history of any bleeding or clotting disorders.

## 2013-09-22 NOTE — Telephone Encounter (Signed)
Message copied by Thamas Jaegers on Fri Sep 22, 2013  8:34 AM ------      Message from: Terrance Mass      Created: Thu Sep 21, 2013  4:04 PM       Please schedule appointment with Dr. Collene Mares or Dr. Benson Norway early part of nest week.Patient with history of proctitis. ------

## 2013-09-22 NOTE — Telephone Encounter (Signed)
I called the below office and was told will need a referral from PCP first before appointment can be made. I called and left this on pt voicemail.

## 2013-09-22 NOTE — Telephone Encounter (Signed)
Pt was given the name of Dr.Hung office to have referral sent there.

## 2013-09-23 ENCOUNTER — Encounter (HOSPITAL_COMMUNITY): Payer: Self-pay | Admitting: Emergency Medicine

## 2013-09-23 ENCOUNTER — Emergency Department (HOSPITAL_COMMUNITY)
Admission: EM | Admit: 2013-09-23 | Discharge: 2013-09-23 | Disposition: A | Discharge status: Home / Self Care | Attending: Emergency Medicine | Admitting: Emergency Medicine

## 2013-09-23 DIAGNOSIS — F3289 Other specified depressive episodes: Secondary | ICD-10-CM | POA: Insufficient documentation

## 2013-09-23 DIAGNOSIS — Z862 Personal history of diseases of the blood and blood-forming organs and certain disorders involving the immune mechanism: Secondary | ICD-10-CM | POA: Insufficient documentation

## 2013-09-23 DIAGNOSIS — G40909 Epilepsy, unspecified, not intractable, without status epilepticus: Secondary | ICD-10-CM | POA: Insufficient documentation

## 2013-09-23 DIAGNOSIS — F329 Major depressive disorder, single episode, unspecified: Secondary | ICD-10-CM | POA: Insufficient documentation

## 2013-09-23 DIAGNOSIS — N949 Unspecified condition associated with female genital organs and menstrual cycle: Secondary | ICD-10-CM | POA: Insufficient documentation

## 2013-09-23 DIAGNOSIS — N390 Urinary tract infection, site not specified: Secondary | ICD-10-CM | POA: Insufficient documentation

## 2013-09-23 DIAGNOSIS — Z8719 Personal history of other diseases of the digestive system: Secondary | ICD-10-CM | POA: Insufficient documentation

## 2013-09-23 DIAGNOSIS — Z79899 Other long term (current) drug therapy: Secondary | ICD-10-CM | POA: Insufficient documentation

## 2013-09-23 DIAGNOSIS — G8929 Other chronic pain: Secondary | ICD-10-CM | POA: Insufficient documentation

## 2013-09-23 DIAGNOSIS — F411 Generalized anxiety disorder: Secondary | ICD-10-CM | POA: Insufficient documentation

## 2013-09-23 DIAGNOSIS — R102 Pelvic and perineal pain: Secondary | ICD-10-CM

## 2013-09-23 DIAGNOSIS — Z880 Allergy status to penicillin: Secondary | ICD-10-CM | POA: Insufficient documentation

## 2013-09-23 DIAGNOSIS — Z792 Long term (current) use of antibiotics: Secondary | ICD-10-CM | POA: Insufficient documentation

## 2013-09-23 DIAGNOSIS — Z3202 Encounter for pregnancy test, result negative: Secondary | ICD-10-CM | POA: Insufficient documentation

## 2013-09-23 LAB — URINALYSIS, ROUTINE W REFLEX MICROSCOPIC
Bilirubin Urine: NEGATIVE
Glucose, UA: NEGATIVE mg/dL
Hgb urine dipstick: NEGATIVE
Ketones, ur: NEGATIVE mg/dL
LEUKOCYTES UA: NEGATIVE
Nitrite: NEGATIVE
PH: 7.5 (ref 5.0–8.0)
PROTEIN: NEGATIVE mg/dL
Specific Gravity, Urine: 1.012 (ref 1.005–1.030)
Urobilinogen, UA: 1 mg/dL (ref 0.0–1.0)

## 2013-09-23 LAB — POC URINE PREG, ED: Preg Test, Ur: NEGATIVE

## 2013-09-23 MED ORDER — ONDANSETRON 4 MG PO TBDP
4.0000 mg | ORAL_TABLET | Freq: Once | ORAL | Status: AC
Start: 2013-09-23 — End: 2013-09-23
  Administered 2013-09-23: 4 mg via ORAL
  Filled 2013-09-23: qty 1

## 2013-09-23 MED ORDER — KETOROLAC TROMETHAMINE 15 MG/ML IJ SOLN
30.0000 mg | Freq: Once | INTRAMUSCULAR | Status: AC
  Administered 2013-09-23: 30 mg via INTRAMUSCULAR
  Filled 2013-09-23: qty 2

## 2013-09-23 MED ORDER — HYDROMORPHONE HCL PF 2 MG/ML IJ SOLN
2.0000 mg | Freq: Once | INTRAMUSCULAR | Status: AC
  Administered 2013-09-23: 2 mg via INTRAMUSCULAR
  Filled 2013-09-23: qty 1

## 2013-09-23 NOTE — ED Provider Notes (Signed)
CSN: [id number]     Arrival date & time 09/23/13  0014 History   First MD Initiated Contact with Patient 09/23/13 0048     Chief Complaint  Patient presents with  . Abdominal Pain     (Consider location/radiation/quality/duration/timing/severity/associated sxs/prior Treatment) HPI This patient is a 38 year old woman who recently relocated to this area. She reports chronic pelvic pain. She has had chronic pelvic pain since she was 38 years old, for the last 20 years.  She says she is having an acute exacerbation of her chronic pelvic pain. She was seen in this emergency department 5 days ago for similar symptoms. Diagnosed with urinary tract infection and prescribed ciprofloxacin. She reports compliance with this medication.  She was referred to primary care physician who, in turn, referred her to a gynecologist. She is a gynecologist just 2 days ago. She reports that she had an ultrasound performed and was told that she might have endometriosis. The pain localizes to the right lower quadrant, as it has for the past 20 years.  Patient denies nausea vomiting. Fever. He reports that the only medication which works adequately to treat her abdominal pain is Dilaudid.  Pain as 10 over 10. It is nonradiating. Nothing exacerbates or seems to relieve her symptoms.  Past Medical History  Diagnosis Date  . Proctitis   . Cysts of both ovaries   . Seizures   . Anemia   . Anxiety   . Blood transfusion without reported diagnosis   . Depression    Past Surgical History  Procedure Laterality Date  . Ovarian cyst removal     Family History  Problem Relation Age of Onset  . Diabetes Mother   . Hyperlipidemia Mother   . Stroke Mother   . Diabetes Father    History  Substance Use Topics  . Smoking status: Never Smoker   . Smokeless tobacco: Not on file  . Alcohol Use: Yes   OB History   Grav Para Term Preterm Abortions TAB SAB Ect Mult Living   7 3   4  4   3      Review of Systems Ten  point review of symptoms performed and is negative with the exception of symptoms noted above.     Allergies  Morphine and related; Penicillins; and Tramadol  Home Medications   Current Outpatient Rx  Name  Route  Sig  Dispense  Refill  . ciprofloxacin (CIPRO) 500 MG tablet   Oral   Take 1 tablet (500 mg total) by mouth 2 (two) times daily. One po bid x 7 days   14 tablet   0   . gabapentin (NEURONTIN) 300 MG capsule   Oral   Take 300 mg by mouth 3 (three) times daily.         Marland Kitchen ketorolac (TORADOL) 10 MG tablet   Oral   Take 1 tablet (10 mg total) by mouth every 6 (six) hours as needed.   20 tablet   0   . oxyCODONE-acetaminophen (PERCOCET/ROXICET) 5-325 MG per tablet   Oral   Take 1 tablet by mouth every 6 (six) hours as needed for severe pain.   30 tablet   0   . traZODone (DESYREL) 100 MG tablet   Oral   Take 100 mg by mouth at bedtime as needed for sleep.         . norethindrone-ethinyl estradiol (JUNEL FE 1/20) 1-20 MG-MCG tablet   Oral   Take 1 tablet by mouth daily.  1 Package   11    BP 114/50  Pulse 110  Temp(Src) 98 F (36.7 C) (Oral)  Resp 18  Ht 5\' 6"  (1.676 m)  Wt 120 lb (54.432 kg)  BMI 19.38 kg/m2  SpO2 100%  LMP 09/17/2013 Physical Exam Gen: well developed and well nourished appearing, rocking back and forth, yelling and moaning loudly in bed and intermittently grimacing.  Head: NCAT Eyes: PERL, EOMI Nose: no epistaixis or rhinorrhea Mouth/throat: mucosa is moist and pink Neck: supple, no stridor Lungs: CTA B, no wheezing, rhonchi or rales CV: regular rate and rythm, good distal pulses.  Abd: soft, ttp RLQ, no peritoneal signs., nondistended Back: no ttp, no cva ttp Skin: warm and dry Ext: no edema, normal to inspection Neuro: CN ii-xii grossly intact, no focal deficits Psyche; normal affect,  calm and cooperative.  ED Course  Procedures (including critical care time) Labs Review  Results for orders placed during the  hospital encounter of 09/23/13 (from the past 24 hour(s))  URINALYSIS, ROUTINE W REFLEX MICROSCOPIC     Status: Abnormal   Collection Time    09/23/13  2:53 AM      Result Value Ref Range   Color, Urine YELLOW  YELLOW   APPearance CLOUDY (*) CLEAR   Specific Gravity, Urine 1.012  1.005 - 1.030   pH 7.5  5.0 - 8.0   Glucose, UA NEGATIVE  NEGATIVE mg/dL   Hgb urine dipstick NEGATIVE  NEGATIVE   Bilirubin Urine NEGATIVE  NEGATIVE   Ketones, ur NEGATIVE  NEGATIVE mg/dL   Protein, ur NEGATIVE  NEGATIVE mg/dL   Urobilinogen, UA 1.0  0.0 - 1.0 mg/dL   Nitrite NEGATIVE  NEGATIVE   Leukocytes, UA NEGATIVE  NEGATIVE  POC URINE PREG, ED     Status: None   Collection Time    09/23/13  2:59 AM      Result Value Ref Range   Preg Test, Ur NEGATIVE  NEGATIVE     MDM   Patient is stable for discharge. Demonstrates opiate seeking behavior.     Elyn Peers, MD 09/23/13 [phone]

## 2013-09-23 NOTE — ED Notes (Signed)
According to patient she has had this pain for the last 20 yrs. And that she feels like something is inside of her. She was seen at urgent care on yesterday for the same problem and pregnancy was ruled out. Patient reported to EMS that blood gushed from her vagina and splattered all over the walls while she was in the shower.

## 2013-09-23 NOTE — Discharge Instructions (Signed)
Abdominal Pain, Women °Abdominal (stomach, pelvic, or belly) pain can be caused by many things. It is important to tell your doctor: °· The location of the pain. °· Does it come and go or is it present all the time? °· Are there things that start the pain (eating certain foods, exercise)? °· Are there other symptoms associated with the pain (fever, nausea, vomiting, diarrhea)? °All of this is helpful to know when trying to find the cause of the pain. °CAUSES  °· Stomach: virus or bacteria infection, or ulcer. °· Intestine: appendicitis (inflamed appendix), regional ileitis (Crohn's disease), ulcerative colitis (inflamed colon), irritable bowel syndrome, diverticulitis (inflamed diverticulum of the colon), or cancer of the stomach or intestine. °· Gallbladder disease or stones in the gallbladder. °· Kidney disease, kidney stones, or infection. °· Pancreas infection or cancer. °· Fibromyalgia (pain disorder). °· Diseases of the female organs: °· Uterus: fibroid (non-cancerous) tumors or infection. °· Fallopian tubes: infection or tubal pregnancy. °· Ovary: cysts or tumors. °· Pelvic adhesions (scar tissue). °· Endometriosis (uterus lining tissue growing in the pelvis and on the pelvic organs). °· Pelvic congestion syndrome (female organs filling up with blood just before the menstrual period). °· Pain with the menstrual period. °· Pain with ovulation (producing an egg). °· Pain with an IUD (intrauterine device, birth control) in the uterus. °· Cancer of the female organs. °· Functional pain (pain not caused by a disease, may improve without treatment). °· Psychological pain. °· Depression. °DIAGNOSIS  °Your doctor will decide the seriousness of your pain by doing an examination. °· Blood tests. °· X-rays. °· Ultrasound. °· CT scan (computed tomography, special type of X-ray). °· MRI (magnetic resonance imaging). °· Cultures, for infection. °· Barium enema (dye inserted in the large intestine, to better view it with  X-rays). °· Colonoscopy (looking in intestine with a lighted tube). °· Laparoscopy (minor surgery, looking in abdomen with a lighted tube). °· Major abdominal exploratory surgery (looking in abdomen with a large incision). °TREATMENT  °The treatment will depend on the cause of the pain.  °· Many cases can be observed and treated at home. °· Over-the-counter medicines recommended by your caregiver. °· Prescription medicine. °· Antibiotics, for infection. °· Birth control pills, for painful periods or for ovulation pain. °· Hormone treatment, for endometriosis. °· Nerve blocking injections. °· Physical therapy. °· Antidepressants. °· Counseling with a psychologist or psychiatrist. °· Minor or major surgery. °HOME CARE INSTRUCTIONS  °· Do not take laxatives, unless directed by your caregiver. °· Take over-the-counter pain medicine only if ordered by your caregiver. Do not take aspirin because it can cause an upset stomach or bleeding. °· Try a clear liquid diet (broth or water) as ordered by your caregiver. Slowly move to a bland diet, as tolerated, if the pain is related to the stomach or intestine. °· Have a thermometer and take your temperature several times a day, and record it. °· Bed rest and sleep, if it helps the pain. °· Avoid sexual intercourse, if it causes pain. °· Avoid stressful situations. °· Keep your follow-up appointments and tests, as your caregiver orders. °· If the pain does not go away with medicine or surgery, you may try: °· Acupuncture. °· Relaxation exercises (yoga, meditation). °· Group therapy. °· Counseling. °SEEK MEDICAL CARE IF:  °· You notice certain foods cause stomach pain. °· Your home care treatment is not helping your pain. °· You need stronger pain medicine. °· You want your IUD removed. °· You feel faint or   lightheaded. °· You develop nausea and vomiting. °· You develop a rash. °· You are having side effects or an allergy to your medicine. °SEEK IMMEDIATE MEDICAL CARE IF:  °· Your  pain does not go away or gets worse. °· You have a fever. °· Your pain is felt only in portions of the abdomen. The right side could possibly be appendicitis. The left lower portion of the abdomen could be colitis or diverticulitis. °· You are passing blood in your stools (bright red or black tarry stools, with or without vomiting). °· You have blood in your urine. °· You develop chills, with or without a fever. °· You pass out. °MAKE SURE YOU:  °· Understand these instructions. °· Will watch your condition. °· Will get help right away if you are not doing well or get worse. °Document Released: 05/03/2007 Document Revised: 09/28/2011 Document Reviewed: 05/23/2009 °[employer]® Patient Information ©2014 [employer]. ° °

## 2013-09-25 NOTE — Telephone Encounter (Signed)
I called patient to see if the referral was made by PCP. I called Dr.Hung office and they did not receive it yet. I left message for pt to call regarding this.

## 2013-09-28 ENCOUNTER — Encounter (HOSPITAL_COMMUNITY): Admitting: Anesthesiology

## 2013-09-28 ENCOUNTER — Inpatient Hospital Stay (HOSPITAL_COMMUNITY)
Admission: AD | Admit: 2013-09-28 | Discharge: 2013-09-29 | DRG: 742 | Disposition: A | Discharge status: Home / Self Care | Source: Ambulatory Visit | Attending: Gynecology | Admitting: Gynecology

## 2013-09-28 ENCOUNTER — Encounter: Payer: Self-pay | Admitting: Gynecology

## 2013-09-28 ENCOUNTER — Inpatient Hospital Stay (HOSPITAL_COMMUNITY): Admitting: Anesthesiology

## 2013-09-28 ENCOUNTER — Ambulatory Visit (INDEPENDENT_AMBULATORY_CARE_PROVIDER_SITE_OTHER): Admitting: Gynecology

## 2013-09-28 ENCOUNTER — Encounter (HOSPITAL_COMMUNITY): Payer: Self-pay

## 2013-09-28 ENCOUNTER — Encounter (HOSPITAL_COMMUNITY)
Admission: AD | Disposition: A | Discharge status: Home / Self Care | Payer: Self-pay | Source: Ambulatory Visit | Attending: Gynecology

## 2013-09-28 ENCOUNTER — Ambulatory Visit: Admit: 2013-09-28 | Payer: Self-pay | Admitting: Gynecology

## 2013-09-28 ENCOUNTER — Other Ambulatory Visit: Payer: Self-pay | Admitting: Emergency Medicine

## 2013-09-28 ENCOUNTER — Ambulatory Visit (INDEPENDENT_AMBULATORY_CARE_PROVIDER_SITE_OTHER)

## 2013-09-28 VITALS — BP 110/70

## 2013-09-28 DIAGNOSIS — K6289 Other specified diseases of anus and rectum: Secondary | ICD-10-CM

## 2013-09-28 DIAGNOSIS — R102 Pelvic and perineal pain: Secondary | ICD-10-CM

## 2013-09-28 DIAGNOSIS — K922 Gastrointestinal hemorrhage, unspecified: Secondary | ICD-10-CM

## 2013-09-28 DIAGNOSIS — N8353 Torsion of ovary, ovarian pedicle and fallopian tube: Secondary | ICD-10-CM

## 2013-09-28 DIAGNOSIS — N8300 Follicular cyst of ovary, unspecified side: Secondary | ICD-10-CM | POA: Diagnosis present

## 2013-09-28 DIAGNOSIS — N838 Other noninflammatory disorders of ovary, fallopian tube and broad ligament: Secondary | ICD-10-CM | POA: Diagnosis present

## 2013-09-28 DIAGNOSIS — F341 Dysthymic disorder: Secondary | ICD-10-CM | POA: Diagnosis present

## 2013-09-28 DIAGNOSIS — N949 Unspecified condition associated with female genital organs and menstrual cycle: Secondary | ICD-10-CM

## 2013-09-28 DIAGNOSIS — K625 Hemorrhage of anus and rectum: Secondary | ICD-10-CM

## 2013-09-28 DIAGNOSIS — R1031 Right lower quadrant pain: Secondary | ICD-10-CM | POA: Diagnosis present

## 2013-09-28 DIAGNOSIS — K389 Disease of appendix, unspecified: Secondary | ICD-10-CM

## 2013-09-28 DIAGNOSIS — R109 Unspecified abdominal pain: Secondary | ICD-10-CM

## 2013-09-28 DIAGNOSIS — D696 Thrombocytopenia, unspecified: Secondary | ICD-10-CM | POA: Diagnosis present

## 2013-09-28 DIAGNOSIS — R1011 Right upper quadrant pain: Secondary | ICD-10-CM

## 2013-09-28 DIAGNOSIS — R100 Acute abdomen: Secondary | ICD-10-CM

## 2013-09-28 DIAGNOSIS — Z9889 Other specified postprocedural states: Secondary | ICD-10-CM

## 2013-09-28 HISTORY — PX: LAPAROSCOPIC APPENDECTOMY: SHX408

## 2013-09-28 HISTORY — PX: LAPAROSCOPY: SHX197

## 2013-09-28 HISTORY — PX: SALPINGOOPHORECTOMY: SHX82

## 2013-09-28 LAB — CBC
HCT: 32.6 % — ABNORMAL LOW (ref 36.0–46.0)
HEMOGLOBIN: 11.1 g/dL — AB (ref 12.0–15.0)
MCH: 30.6 pg (ref 26.0–34.0)
MCHC: 34 g/dL (ref 30.0–36.0)
MCV: 89.8 fL (ref 78.0–100.0)
Platelets: 120 10*3/uL — ABNORMAL LOW (ref 150–400)
RBC: 3.63 MIL/uL — ABNORMAL LOW (ref 3.87–5.11)
RDW: 15 % (ref 11.5–15.5)
WBC: 3.4 10*3/uL — ABNORMAL LOW (ref 4.0–10.5)

## 2013-09-28 LAB — ABO/RH: ABO/RH(D): O POS

## 2013-09-28 LAB — COMPREHENSIVE METABOLIC PANEL
ALT: 10 U/L (ref 0–35)
AST: 33 U/L (ref 0–37)
Albumin: 3.8 g/dL (ref 3.5–5.2)
Alkaline Phosphatase: 66 U/L (ref 39–117)
BUN: 4 mg/dL — ABNORMAL LOW (ref 6–23)
CALCIUM: 8.3 mg/dL — AB (ref 8.4–10.5)
CO2: 26 mEq/L (ref 19–32)
CREATININE: 0.49 mg/dL — AB (ref 0.50–1.10)
Chloride: 99 mEq/L (ref 96–112)
GLUCOSE: 115 mg/dL — AB (ref 70–99)
Potassium: 3.5 mEq/L — ABNORMAL LOW (ref 3.7–5.3)
SODIUM: 140 meq/L (ref 137–147)
Total Bilirubin: 0.7 mg/dL (ref 0.3–1.2)
Total Protein: 6.6 g/dL (ref 6.0–8.3)

## 2013-09-28 LAB — TYPE AND SCREEN
ABO/RH(D): O POS
ANTIBODY SCREEN: NEGATIVE

## 2013-09-28 SURGERY — LAPAROSCOPY OPERATIVE
Anesthesia: General | Site: Abdomen | Laterality: Right

## 2013-09-28 MED ORDER — FENTANYL CITRATE 0.05 MG/ML IJ SOLN
INTRAMUSCULAR | Status: AC
  Filled 2013-09-28: qty 2

## 2013-09-28 MED ORDER — MEPERIDINE HCL 25 MG/ML IJ SOLN
6.2500 mg | INTRAMUSCULAR | Status: DC | PRN
  Administered 2013-09-28 (×2): 6.25 mg via INTRAVENOUS

## 2013-09-28 MED ORDER — ROCURONIUM BROMIDE 100 MG/10ML IV SOLN
INTRAVENOUS | Status: AC
  Filled 2013-09-28: qty 1

## 2013-09-28 MED ORDER — FENTANYL 10 MCG/ML IV SOLN
INTRAVENOUS | Status: DC
  Administered 2013-09-28: via INTRAVENOUS
  Administered 2013-09-29: 120 ug via INTRAVENOUS
  Administered 2013-09-29: 270 ug via INTRAVENOUS
  Administered 2013-09-29: 75 ug via INTRAVENOUS
  Filled 2013-09-28: qty 50

## 2013-09-28 MED ORDER — SUCCINYLCHOLINE CHLORIDE 20 MG/ML IJ SOLN
INTRAMUSCULAR | Status: DC | PRN
  Administered 2013-09-28: 120 mg via INTRAVENOUS

## 2013-09-28 MED ORDER — GLYCOPYRROLATE 0.2 MG/ML IJ SOLN
INTRAMUSCULAR | Status: AC
  Filled 2013-09-28: qty 1

## 2013-09-28 MED ORDER — ONDANSETRON HCL 4 MG/2ML IJ SOLN
4.0000 mg | Freq: Four times a day (QID) | INTRAMUSCULAR | Status: DC | PRN
Start: 2013-09-28 — End: 2013-09-29

## 2013-09-28 MED ORDER — DEXAMETHASONE SODIUM PHOSPHATE 10 MG/ML IJ SOLN
INTRAMUSCULAR | Status: DC | PRN
  Administered 2013-09-28: 10 mg via INTRAVENOUS

## 2013-09-28 MED ORDER — BUPIVACAINE HCL (PF) 0.25 % IJ SOLN
INTRAMUSCULAR | Status: DC | PRN
  Administered 2013-09-28: 10 mL

## 2013-09-28 MED ORDER — SODIUM CHLORIDE 0.9 % IJ SOLN: 9.0000 mL | INTRAMUSCULAR | Status: DC | PRN

## 2013-09-28 MED ORDER — ROCURONIUM BROMIDE 100 MG/10ML IV SOLN
INTRAVENOUS | Status: DC | PRN
  Administered 2013-09-28: 40 mg via INTRAVENOUS

## 2013-09-28 MED ORDER — NEOSTIGMINE METHYLSULFATE 1 MG/ML IJ SOLN
INTRAMUSCULAR | Status: DC | PRN
  Administered 2013-09-28: 3 mg via INTRAVENOUS

## 2013-09-28 MED ORDER — NEOSTIGMINE METHYLSULFATE 1 MG/ML IJ SOLN
INTRAMUSCULAR | Status: AC
  Filled 2013-09-28: qty 1

## 2013-09-28 MED ORDER — MEPERIDINE HCL 25 MG/ML IJ SOLN
INTRAMUSCULAR | Status: AC
  Filled 2013-09-28: qty 1

## 2013-09-28 MED ORDER — FENTANYL CITRATE 0.05 MG/ML IJ SOLN
25.0000 ug | INTRAMUSCULAR | Status: DC | PRN
  Administered 2013-09-28 (×4): 50 ug via INTRAVENOUS

## 2013-09-28 MED ORDER — GLYCOPYRROLATE 0.2 MG/ML IJ SOLN
INTRAMUSCULAR | Status: DC | PRN
  Administered 2013-09-28: 0.6 mg via INTRAVENOUS

## 2013-09-28 MED ORDER — LACTATED RINGERS IR SOLN
Status: DC | PRN
  Administered 2013-09-28: 1000 mL

## 2013-09-28 MED ORDER — DIPHENHYDRAMINE HCL 12.5 MG/5ML PO ELIX
12.5000 mg | ORAL_SOLUTION | Freq: Four times a day (QID) | ORAL | Status: DC | PRN
  Filled 2013-09-28: qty 5

## 2013-09-28 MED ORDER — HYDROCODONE-ACETAMINOPHEN 10-325 MG PO TABS: 1.0000 | ORAL_TABLET | ORAL | Status: DC | PRN

## 2013-09-28 MED ORDER — FENTANYL CITRATE 0.05 MG/ML IJ SOLN
INTRAMUSCULAR | Status: AC
  Filled 2013-09-28: qty 5

## 2013-09-28 MED ORDER — MIDAZOLAM HCL 2 MG/2ML IJ SOLN: 0.5000 mg | Freq: Once | INTRAMUSCULAR | Status: DC | PRN

## 2013-09-28 MED ORDER — MIDAZOLAM HCL 2 MG/2ML IJ SOLN
INTRAMUSCULAR | Status: AC
  Filled 2013-09-28: qty 2

## 2013-09-28 MED ORDER — HYDROMORPHONE HCL PF 1 MG/ML IJ SOLN
1.0000 mg | Freq: Once | INTRAMUSCULAR | Status: AC
  Administered 2013-09-28: 1 mg via INTRAVENOUS
  Filled 2013-09-28: qty 1

## 2013-09-28 MED ORDER — PROPOFOL 10 MG/ML IV BOLUS
INTRAVENOUS | Status: DC | PRN
  Administered 2013-09-28: 150 mg via INTRAVENOUS

## 2013-09-28 MED ORDER — PROPOFOL 10 MG/ML IV EMUL
INTRAVENOUS | Status: AC
  Filled 2013-09-28: qty 20

## 2013-09-28 MED ORDER — LACTATED RINGERS IV SOLN
INTRAVENOUS | Status: DC
  Administered 2013-09-29 (×2): via INTRAVENOUS

## 2013-09-28 MED ORDER — LIDOCAINE HCL (CARDIAC) 20 MG/ML IV SOLN
INTRAVENOUS | Status: AC
  Filled 2013-09-28: qty 5

## 2013-09-28 MED ORDER — DIPHENHYDRAMINE HCL 50 MG/ML IJ SOLN: 12.5000 mg | Freq: Four times a day (QID) | INTRAMUSCULAR | Status: DC | PRN

## 2013-09-28 MED ORDER — LACTATED RINGERS IV SOLN
INTRAVENOUS | Status: DC | PRN
  Administered 2013-09-28 (×2): via INTRAVENOUS

## 2013-09-28 MED ORDER — LIDOCAINE HCL (CARDIAC) 20 MG/ML IV SOLN
INTRAVENOUS | Status: DC | PRN
  Administered 2013-09-28: 40 mg via INTRAVENOUS

## 2013-09-28 MED ORDER — FENTANYL CITRATE 0.05 MG/ML IJ SOLN
INTRAMUSCULAR | Status: DC | PRN
  Administered 2013-09-28: 100 ug via INTRAVENOUS
  Administered 2013-09-28: 50 ug via INTRAVENOUS
  Administered 2013-09-28: 100 ug via INTRAVENOUS
  Administered 2013-09-28: 50 ug via INTRAVENOUS

## 2013-09-28 MED ORDER — PROMETHAZINE HCL 25 MG/ML IJ SOLN: 6.2500 mg | INTRAMUSCULAR | Status: DC | PRN

## 2013-09-28 MED ORDER — GENTAMICIN SULFATE 40 MG/ML IJ SOLN
INTRAVENOUS | Status: AC
  Administered 2013-09-28: 112.75 mL via INTRAVENOUS
  Filled 2013-09-28: qty 6.75

## 2013-09-28 MED ORDER — NALOXONE HCL 0.4 MG/ML IJ SOLN: 0.4000 mg | INTRAMUSCULAR | Status: DC | PRN

## 2013-09-28 MED ORDER — HYDROCODONE-ACETAMINOPHEN 5-325 MG PO TABS
1.0000 | ORAL_TABLET | ORAL | Status: DC | PRN
  Administered 2013-09-29: 2 via ORAL
  Filled 2013-09-28: qty 2

## 2013-09-28 MED ORDER — DEXAMETHASONE SODIUM PHOSPHATE 10 MG/ML IJ SOLN
INTRAMUSCULAR | Status: AC
  Filled 2013-09-28: qty 1

## 2013-09-28 MED ORDER — ONDANSETRON HCL 4 MG/2ML IJ SOLN
INTRAMUSCULAR | Status: DC | PRN
  Administered 2013-09-28: 4 mg via INTRAVENOUS

## 2013-09-28 MED ORDER — FENTANYL CITRATE 0.05 MG/ML IJ SOLN
INTRAMUSCULAR | Status: AC
Start: 2013-09-28 — End: 2013-09-29
  Filled 2013-09-28: qty 2

## 2013-09-28 SURGICAL SUPPLY — 29 items
BARRIER ADHS 3X4 INTERCEED (GAUZE/BANDAGES/DRESSINGS) IMPLANT
CABLE HIGH FREQUENCY MONO STRZ (ELECTRODE) IMPLANT
CLOTH BEACON ORANGE TIMEOUT ST (SAFETY) ×4 IMPLANT
COVER MAYO STAND STRL (DRAPES) ×4 IMPLANT
CUTTER ENDO LINEAR 45M (STAPLE) ×4 IMPLANT
CUTTER FLEX LINEAR 45M (STAPLE) ×4 IMPLANT
DERMABOND ADVANCED (GAUZE/BANDAGES/DRESSINGS) ×4
DERMABOND ADVANCED .7 DNX12 (GAUZE/BANDAGES/DRESSINGS) ×4 IMPLANT
FILTER SMOKE EVAC LAPAROSHD (FILTER) ×4 IMPLANT
GLOVE BIOGEL PI IND STRL 8 (GLOVE) ×2 IMPLANT
GLOVE BIOGEL PI INDICATOR 8 (GLOVE) ×2
GLOVE ECLIPSE 7.5 STRL STRAW (GLOVE) ×8 IMPLANT
GOWN STRL REUS W/TWL LRG LVL3 (GOWN DISPOSABLE) ×8 IMPLANT
NS IRRIG 1000ML POUR BTL (IV SOLUTION) ×4 IMPLANT
PACK LAPAROSCOPY BASIN (CUSTOM PROCEDURE TRAY) ×4 IMPLANT
POUCH SPECIMEN RETRIEVAL 10MM (ENDOMECHANICALS) ×8 IMPLANT
PROTECTOR NERVE ULNAR (MISCELLANEOUS) ×4 IMPLANT
SCALPEL HARMONIC ACE (MISCELLANEOUS) ×4 IMPLANT
SET IRRIG TUBING LAPAROSCOPIC (IRRIGATION / IRRIGATOR) ×4 IMPLANT
SOLUTION ELECTROLUBE (MISCELLANEOUS) ×4 IMPLANT
SUT VIC AB 3-0 PS2 18 (SUTURE) ×2
SUT VIC AB 3-0 PS2 18XBRD (SUTURE) ×2 IMPLANT
SUT VICRYL 0 UR6 27IN ABS (SUTURE) ×4 IMPLANT
TOWEL OR 17X24 6PK STRL BLUE (TOWEL DISPOSABLE) ×8 IMPLANT
TRAY FOLEY CATH 14FR (SET/KITS/TRAYS/PACK) ×4 IMPLANT
TROCAR XCEL NON-BLD 11X100MML (ENDOMECHANICALS) ×4 IMPLANT
TROCAR XCEL NON-BLD 5MMX100MML (ENDOMECHANICALS) ×4 IMPLANT
TROCAR XCEL OPT SLVE 5M 100M (ENDOMECHANICALS) ×4 IMPLANT
WARMER LAPAROSCOPE (MISCELLANEOUS) ×4 IMPLANT

## 2013-09-28 NOTE — Progress Notes (Addendum)
Kristen Roberson Patient presented to the office today complaining of rectal bleeding once again like she did last week along with right lower abdominal pain. My note from 09/22/2013 read as follows:  Patient is a 38 year old gravida 7 para 3 AB 4 (2 spontaneous AB and 2 elective terminations) who was referred to our practice as a courtesy of Dr. Synetta Shadow in reference to patient's right lower quadrant abdominal pains. Patient initially had reported to [facility] long emergency room March 1 complaining of excruciating right lower quadrant pain which had not improved with Percocet. She stated that her last menstrual period was 4 days prior. She was currently not using any form of contraception. She denied any nausea, fever, or chills. Patient does report irregular cycles for the past few years. It was assumed that she was on a Depo-Provera injection but states that she never went in received any injections. Patient does state that she has had history of ovarian cysts in the past in many years ago she had laparoscopic right ovarian cystectomy. She states she has no other medical problems and has been healthy otherwise.  She had reported earlier today a gush of vaginal bleeding right before her cramping started  Patient had the following blood tests and CT done in the ED:  CBC: White blood count 3.1, hemoglobin 11.2 and hematocrit 33. 0  Urine pregnancy test: Negative  UA moderate hemoglobin otherwise negative for infection  CT of abdomen and pelvis:IMPRESSION:  1. No acute intra-abdominal abnormality.  2. L5 pars defects with significant anterolisthesis.   She l presented  March 5 to Dr. Tonette Bihari office with recurrence of the right lower quadrant pain and he referred her to our office for further evaluation.  Patient had stable vital signs with a blood pressure 102/64, pulse 84, temperature 97.6.  We repeated her CBC: White blood count 2.5, hemoglobin 10.9, hematocrit 34.7, platelet  count 186,000  When she presented to the office she was complaining of intermittent right lower, pain. She received Toradol 60 mg IM. On abdominal exam she was exquisitely tender in the right lower quadrant pelvic exam was very limited so an ultrasound was done here in our office. The ultrasound demonstrated the following:   The uterus measured 10.0 x 6.4 x 5.0 cm with an endometrial stripe of 7.2 mm. Right and left lower were normal no apparent masses were seen on either adnexa. No free fluid was seen the cul-de-sac prominent endometrial cavity avascular otherwise normal.   Assessment/plan: Patient with history of severe dysmenorrhea and menorrhagia. Symptomatology may be attributed to underlying endometriosis. Normal ultrasound and CT scan. Slightly anemic will be started on iron supplementation. She does not wish to go on a Depo-Provera injection so she will be started on oral contraceptive pills to begin today. I have asked her to return back to the office in one month for followup. I have given her literature and information diagnostic laparoscopy as well as on endometriosis. She was prescribed Toradol 10 mg 1 by mouth every 6 hours for the next 3-5 days that she can take intermittently as needed with the Percocet that was prescribed in the ED. 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 Patient was counseled on the risk and benefits and pros and cons of oral contraceptive pill. She will be  placed on a 20 mcg pill. She has taken oral contraceptive pills many years ago without any problems. She denies any personal or family history of any bleeding or clotting disorders.   Patient has informed me that she has had this pain for several years and was diagnosed with proctitis in [facility] last year but has not  seen a gastroenterologist.   Exam today:  Abdomen soft tender right lower abdomen, with guarding and rebound  Pelvic exam Bartholin urethra Skene's within normal limits  Vagina: No lesions or discharge  Cervix: No lesions or discharge  Uterus anteverted mobile no adnexal tenderness on the left but exquisite tenderness on the right was evident with guarding and positioning herself in the fetal position. Rectovaginal exam was not possible patient was very tender in the rectal region.   Patient was given Toradol 60 mg today and an ultrasound was done to compare with 6 days ago and the ultrasound was normal although doppler study not possible due to patient not being able to lay still.   Due to the patient's excruciating pain she'll be taken to the operating room to rule out adnexal torsion and to better visualize her appendix. General surgeon Dr. Lucia Gaskins has been alerted to be on standby as well.  Pertinent Gynecological History:  Menses: Irregular  Bleeding: irregular  Contraception: OCP (estrogen/progesterone)  DES exposure: denies  Blood transfusions: patient reports transfusion in the past as a result of rectal bleeding  Sexually transmitted diseases: denies  Previous GYN Procedures: right ovarian cystectomy  Last mammogram: not indicated Date: not indicated  Last pap: normal Date: unknown  OB History: G7, P3 A4  Menstrual History:  Menarche age: 2  Patient's last menstrual period was 09/17/2013.  Past Medical History   Diagnosis  Date   .  Proctitis    .  Cysts of both ovaries    .  Seizures    .  Anemia    .  Anxiety    .  Blood transfusion without reported diagnosis    .  Depression     Past Surgical History   Procedure  Laterality  Date   .  Ovarian cyst removal      Family History   Problem  Relation  Age of Onset   .  Diabetes  Mother    .  Hyperlipidemia  Mother    .  Stroke  Mother    .  Diabetes  Father    Social History: reports that she has never smoked. She  does not have any smokeless tobacco history on file. She reports that she drinks alcohol. She reports that she does not use illicit drugs.  Allergies:  Allergies   Allergen  Reactions   .  Morphine And Related      Anaphylaxis   .  Penicillins      unknown   .  Tramadol      Seizures   (Not in a hospital admission)   REVIEW OF SYSTEMS: A ROS was performed and pertinent positives and negatives are included in the history.  GENERAL: No fevers or chills. HEENT: No change in vision, no earache, sore throat or sinus congestion. NECK: No pain or stiffness. CARDIOVASCULAR: No chest pain or pressure. No palpitations. PULMONARY: No shortness of breath, cough or wheeze. GASTROINTESTINAL: No abdominal pain, nausea, vomiting or diarrhea, melena or bright red blood per rectum. GENITOURINARY: No urinary frequency, urgency, hesitancy or dysuria. MUSCULOSKELETAL: No joint or muscle pain, no back pain,  no recent trauma. DERMATOLOGIC: No rash, no itching, no lesions. ENDOCRINE: No polyuria, polydipsia, no heat or cold intolerance. No recent change in weight. HEMATOLOGICAL: No anemia or easy bruising or bleeding. NEUROLOGIC: No headache, seizures, numbness, tingling or weakness. PSYCHIATRIC: No depression, no loss of interest in normal activity or change in sleep pattern.  Blood pressure 110/70, last menstrual period 09/17/2013.  Physical Exam:  HEENT:unremarkable  Neck:Supple, midline, no thyroid megaly, no carotid bruits  Lungs: Clear to auscultation no rhonchi's or wheezes  Heart:Regular rate and rhythm, no murmurs or gallops  Breast Exam:not done  Abdomen:tender with rebound and guarding right lower quadrant  Pelvic:BUSwithin normal limits  Vagina:no lesions or discharge  Cervix:no lesions or discharge  Uterus:tenderness when deviated  Adnexa:tenderness right adnexa with rebound  Extremities: No cords, no edema  Rectal:the patient very uncomfortable did not allow completion  No results found for this  or any previous visit (from the past 24 hour(s)).  No results found.   Assessment/Plan:  Patient with history of chronic right lower quadrant pain worse in the past few months now with rectal bleeding x2. Patient with past history of proctitis and had blood transfusion in the past. Ultrasound normal x2 one week apart and normal CT scan in the ED recently on March 7. Due to the acute nature patient's pain she'll be taken to the operating room tonight for diagnostic laparoscopy to rule out adnexal torsion, endometriosis, chronic appendix, pelvic adhesions. Patient was counseled for possible right salpingo-oophorectomy, appendectomy, and possible laparotomy.    Preop labs now:  Negative pregnancy test  Results for Kristen, Roberson (MRN [id number]) as of 09/28/2013 18:27  Ref. Range 09/28/2013 18:00  Sodium Latest Range: 137-147 mEq/L 140  Potassium Latest Range: 3.7-5.3 mEq/L 3.5 (L)  Chloride Latest Range: 96-112 mEq/L 99  CO2 Latest Range: 19-32 mEq/L 26  BUN Latest Range: 6-23 mg/dL 4 (L)  Creatinine Latest Range: 0.50-1.10 mg/dL 0.49 (L)  Calcium Latest Range: 8.4-10.5 mg/dL 8.3 (L)  GFR calc non Af Amer Latest Range: >90 mL/min >90  GFR calc Af Amer Latest Range: >90 mL/min >90  Glucose Latest Range: 70-99 mg/dL 115 (H)  Alkaline Phosphatase Latest Range: 39-117 U/L 66  Albumin Latest Range: 3.5-5.2 g/dL 3.8  AST Latest Range: 0-37 U/L 33  ALT Latest Range: 0-35 U/L 10  Total Protein Latest Range: 6.0-8.3 g/dL 6.6  Total Bilirubin Latest Range: 0.3-1.2 mg/dL 0.7  WBC Latest Range: 4.6-10.2 K/uL 3.4 (L)  RBC Latest Range: 4.04-5.48 M/uL 3.63 (L)  Hemoglobin Latest Range: 12.0-15.0 g/dL 11.1 (L)  HCT Latest Range: 36.0-46.0 % 32.6 (L)  MCV Latest Range: 80-97 fL 89.8  MCH Latest Range: 27-31.2 pg 30.6  MCHC Latest Range: 31.8-35.4 g/dL 34.0  RDW Latest Range: 11.5-15.5 % 15.0  Platelets Latest Range: 150-400 K/uL 120 (L)     Patient was counseled as to the risk of surgery to include  the following:   1. Infection (prohylactic antibiotics will be administered)  2. DVT/Pulmonary Embolism (prophylactic pneumo compression stockings will be used)  3.Trauma to internal organs requiring additional surgical procedure to repair any injury to  Internal organs requiring perhaps additional hospitalization days.  4.Hemmorhage requiring transfusion and blood products which carry risks such as anaphylactic reaction, hepatitis and AIDS  Patient had received literature information on the procedure scheduled and all her questions were answered and fully accepts all risk.   [facility] HMD5:33 PMTD@Note : This dictation was prepared with Dragon/digital dictation along withSmart phrase technology. Any transcriptional errors that  result from this process are unintentional.  Terrance Mass  09/28/2013, 5:27 PM  Note: This dictation was prepared with Dragon/digital dictation along withSmart phrase technology. Any transcriptional errors that result from this process are unintentional.

## 2013-09-28 NOTE — Op Note (Signed)
@LOGO @  09/28/2013  8:32 PM  PATIENT:  Kristen Roberson  38 y.o. female  PRE-OPERATIVE DIAGNOSIS: Acute abdomen, possible torsed ovary, possible appendicitis  POST-OPERATIVE DIAGNOSIS: Acute abdomen, possible torsed ovary, possible appendicitis   PROCEDURE:  Procedure(s): LAPAROSCOPY OPERATIVE RIGHT SALPINGO OOPHORECTOMY APPENDECTOMY LAPAROSCOPIC   SURGEON:  Surgeon(s): Terrance Mass, MD Anastasio Auerbach, MD Shann Medal, MD  ANESTHESIA:   general  FINDINGS: A thorough inspection of the pelvic cavity demonstrated normal size uterus tubes and ovaries. Anterior-posterior cul-de-sacs free of adhesions and endometriotic implants. There was no evidence of inguinal or femoral rings on both sides. Normal-appearing appendix. Smooth liver surface and no abnormalities grossly seen on the gallbladder.  DESCRIPTION OF OPERATION: The patient was taken to the operating room where she underwent a successful general endotracheal anesthesia because of her several antibiotic allergies she was started on gentamicin 270 mg and 900 mg of clindamycin for prophylaxis. Patient also had PAS stockings for DVT prophylaxis. A timeout was undertaken to properly identify the patient and discussed out loud procedure planned. The patient was placed in the low lithotomy position. The abdomen vagina and perineum were prepped and draped in the usual sterile fashion. An examination under anesthesia demonstrated a normal-size anteverted uterus with no adnexal masses noted. A Hulka tenaculum was placed for manipulation during laparoscopic procedure. A Foley catheter was inserted into the bladder to monitor urinary output. After the drapes were in place a subumbilical semilunar incision was made followed by introduction of a 10/11 mm Optiview trocar. Once inside the peritoneal cavity a pneumoperitoneum was established with 2 half liters of carbon dioxide. 2 additional 5 mm ports were made under laparoscopic guidance. A  systematic inspection of the abdominal pelvic cavity as described above were noted. Because of the chronic nature patient's pain and worsening recently she underwent a right salpingo-oophorectomy as had been discussed preoperatively regardless of findings. With the use of the harmonic scalpel the right infundibulopelvic ligament was coaptated and transected. The utero-ovarian ligament was coaptated and transected and the remaining pedicles were coaptated and transected. The specimen was retrieved through an Endopouch introduced through the umbilicus and the specimen was passed off the operative field for histological evaluation. There was good hemostasis. The ureter was identified before and after the procedure with good peristalsis. The general surgeon was contacted Dr. Alphonsa Overall to proceed with doing an incidental appendectomy. See separate report. I assisted him during this part of the operation and closed in the following manner. The subumbilical fascial incision was closed with a figure-of-eight of 0 Vicryl suture. The subcuticular tissue was reapproximated with 3-0 Vicryl suture the skin was reapproximated with Dermabond glue as were the 25 mm ports. This was done after the instruments were removed and the CO2 was removed as well. For postoperative analgesia 0.25% Marcaine was infiltrated in all 3 incisions ports for a total of 10 cc. Band-Aids were placed on all 3 incisions ports. The Hulka tenaculum was removed the cervix was inspected so her nitrate was utilized for additional hemostasis at the area where the prong of the Hulka tenaculum had caused some oozing. Once this contained the patient was extubated and transferred to recovery with stable vital signs. Blood loss was minimal. Pictures were obtained.   ESTIMATED BLOOD LOSS: Minimal   Intake/Output Summary (Last 24 hours) at 09/28/13 2032 Last data filed at 09/28/13 2009  Gross per 24 hour  Intake   1000 ml  Output    150 ml  Net  850 ml      BLOOD ADMINISTERED:none   LOCAL MEDICATIONS USED:  MARCAINE   0.25% subcutaneous at incision ports for a total of 10 cc  SPECIMEN:  Source of Specimen:  Right tube and ovary, appendix  DISPOSITION OF SPECIMEN:  PATHOLOGY  COUNTS:  YES  PLAN OF CARE: Transfer to PACU  [facility] HMD8:32 PMTD@  Note: This dictation was prepared with  Dragon/digital dictation along withSmart phrase technology. Any transcriptional errors that result from this process are unintentional.

## 2013-09-28 NOTE — Anesthesia Preprocedure Evaluation (Signed)
Anesthesia Evaluation  Patient identified by MRN, date of birth, ID band Patient awake    Reviewed: Allergy & Precautions, H&P , Patient's Chart, lab work & pertinent test results, reviewed documented beta blocker date and time   History of Anesthesia Complications Negative for: history of anesthetic complications  Airway Mallampati: II TM Distance: >3 FB Neck ROM: full    Dental   Pulmonary  breath sounds clear to auscultation        Cardiovascular Exercise Tolerance: Good Rhythm:regular Rate:Normal     Neuro/Psych Seizures -,  PSYCHIATRIC DISORDERS Anxiety Depression    GI/Hepatic   Endo/Other    Renal/GU      Musculoskeletal   Abdominal   Peds  Hematology   Anesthesia Other Findings   Reproductive/Obstetrics                           Anesthesia Physical Anesthesia Plan  ASA: II and emergent  Anesthesia Plan: General ETT   Post-op Pain Management:    Induction: Rapid sequence, Cricoid pressure planned and Intravenous  Airway Management Planned: Oral ETT  Additional Equipment:   Intra-op Plan:   Post-operative Plan:   Informed Consent: I have reviewed the patients History and Physical, chart, labs and discussed the procedure including the risks, benefits and alternatives for the proposed anesthesia with the patient or authorized representative who has indicated his/her understanding and acceptance.   Dental Advisory Given  Plan Discussed with: CRNA and Surgeon  Anesthesia Plan Comments:         Anesthesia Quick Evaluation

## 2013-09-28 NOTE — Anesthesia Postprocedure Evaluation (Signed)
  Anesthesia Post-op Note  Patient: Kristen Roberson  Procedure(s) Performed: Procedure(s): LAPAROSCOPY OPERATIVE (N/A) APPENDECTOMY LAPAROSCOPIC (Right) SALPINGO OOPHORECTOMY (Right)  Patient Location: PACU  Anesthesia Type:General  Level of Consciousness: awake, alert  and oriented  Airway and Oxygen Therapy: Patient Spontanous Breathing and Patient connected to nasal cannula oxygen  Post-op Pain: moderate  Post-op Assessment: Post-op Vital signs reviewed, Patient's Cardiovascular Status Stable, Respiratory Function Stable, Patent Airway, No signs of Nausea or vomiting and Pain level controlled  Post-op Vital Signs: Reviewed and stable  Complications: No apparent anesthesia complications

## 2013-09-28 NOTE — H&P (Signed)
  See previous note dictated earlier.

## 2013-09-28 NOTE — H&P (Signed)
Kristen Roberson Patient presented to the office today complaining of rectal bleeding once again like she did last week along with right lower abdominal pain. My note from 09/22/2013 read as follows:  Patient is a 38 year old gravida 7 para 3 AB 4 (2 spontaneous AB and 2 elective terminations) who was referred to our practice as a courtesy of Dr. Synetta Shadow in reference to patient's right lower quadrant abdominal pains. Patient initially had reported to [facility] long emergency room March 1 complaining of excruciating right lower quadrant pain which had not improved with Percocet. She stated that her last menstrual period was 4 days prior. She was currently not using any form of contraception. She denied any nausea, fever, or chills. Patient does report irregular cycles for the past few years. It was assumed that she was on a Depo-Provera injection but states that she never went in received any injections. Patient does state that she has had history of ovarian cysts in the past in many years ago she had laparoscopic right ovarian cystectomy. She states she has no other medical problems and has been healthy otherwise.  She had reported earlier today a gush of vaginal bleeding right before her cramping started  Patient had the following blood tests and CT done in the ED:  CBC: White blood count 3.1, hemoglobin 11.2 and hematocrit 33. 0  Urine pregnancy test: Negative  UA moderate hemoglobin otherwise negative for infection  CT of abdomen and pelvis:IMPRESSION:  1. No acute intra-abdominal abnormality.  2. L5 pars defects with significant anterolisthesis.   She l presented  March 5 to Dr. Tonette Bihari office with recurrence of the right lower quadrant pain and he referred her to our office for further evaluation.  Patient had stable vital signs with a blood pressure 102/64, pulse 84, temperature 97.6.  We repeated her CBC: White blood count 2.5, hemoglobin 10.9, hematocrit 34.7, platelet count 186,000   When she presented to the office she was complaining of intermittent right lower, pain. She received Toradol 60 mg IM. On abdominal exam she was exquisitely tender in the right lower quadrant pelvic exam was very limited so an ultrasound was done here in our office. The ultrasound demonstrated the following:   The uterus measured 10.0 x 6.4 x 5.0 cm with an endometrial stripe of 7.2 mm. Right and left lower were normal no apparent masses were seen on either adnexa. No free fluid was seen the cul-de-sac prominent endometrial cavity avascular otherwise normal.   Assessment/plan: Patient with history of severe dysmenorrhea and menorrhagia. Symptomatology may be attributed to underlying endometriosis. Normal ultrasound and CT scan. Slightly anemic will be started on iron supplementation. She does not wish to go on a Depo-Provera injection so she will be started on oral contraceptive pills to begin today. I have asked her to return back to the office in one month for followup. I have given her literature and information diagnostic laparoscopy as well as on endometriosis. She was prescribed Toradol 10 mg 1 by mouth every 6 hours for the next 3-5 days that she can take intermittently as needed with the Percocet that was prescribed in the ED. 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 Patient was counseled on the risk and benefits and pros and cons of oral contraceptive pill. She will be placed on a 20  mcg pill. She has taken oral contraceptive pills many years ago without any problems. She denies any personal or family history of any bleeding or clotting disorders.   Patient has informed me that she has had this pain for several years and was diagnosed with proctitis in [facility] last year but has not seen a  gastroenterologist.   Exam today:  Abdomen soft tender right lower abdomen, with guarding and rebound  Pelvic exam Bartholin urethra Skene's within normal limits  Vagina: No lesions or discharge  Cervix: No lesions or discharge  Uterus anteverted mobile no adnexal tenderness on the left but exquisite tenderness on the right was evident with guarding and positioning herself in the fetal position. Rectovaginal exam was not possible patient was very tender in the rectal region.   Patient was given Toradol 60 mg today and an ultrasound was done to compare with 6 days ago and the ultrasound was normal although doppler study not possible due to patient not being able to lay still.   Due to the patient's excruciating pain she'll be taken to the operating room to rule out adnexal torsion and to better visualize her appendix. General surgeon Dr. Lucia Gaskins has been alerted to be on standby as well.  Pertinent Gynecological History:  Menses: Irregular  Bleeding: irregular  Contraception: OCP (estrogen/progesterone)  DES exposure: denies  Blood transfusions: patient reports transfusion in the past as a result of rectal bleeding  Sexually transmitted diseases: denies  Previous GYN Procedures: right ovarian cystectomy  Last mammogram: not indicated Date: not indicated  Last pap: normal Date: unknown  OB History: G7, P3 A4  Menstrual History:  Menarche age: 19  Patient's last menstrual period was 09/17/2013.  Past Medical History   Diagnosis  Date   .  Proctitis    .  Cysts of both ovaries    .  Seizures    .  Anemia    .  Anxiety    .  Blood transfusion without reported diagnosis    .  Depression     Past Surgical History   Procedure  Laterality  Date   .  Ovarian cyst removal      Family History   Problem  Relation  Age of Onset   .  Diabetes  Mother    .  Hyperlipidemia  Mother    .  Stroke  Mother    .  Diabetes  Father    Social History: reports that she has never smoked. She does  not have any smokeless tobacco history on file. She reports that she drinks alcohol. She reports that she does not use illicit drugs.  Allergies:  Allergies   Allergen  Reactions   .  Morphine And Related      Anaphylaxis   .  Penicillins      unknown   .  Tramadol      Seizures   (Not in a hospital admission)   REVIEW OF SYSTEMS: A ROS was performed and pertinent positives and negatives are included in the history.  GENERAL: No fevers or chills. HEENT: No change in vision, no earache, sore throat or sinus congestion. NECK: No pain or stiffness. CARDIOVASCULAR: No chest pain or pressure. No palpitations. PULMONARY: No shortness of breath, cough or wheeze. GASTROINTESTINAL: No abdominal pain, nausea, vomiting or diarrhea, melena or bright red blood per rectum. GENITOURINARY: No urinary frequency, urgency, hesitancy or dysuria. MUSCULOSKELETAL: No joint or muscle pain, no back pain, no recent trauma. DERMATOLOGIC:  No rash, no itching, no lesions. ENDOCRINE: No polyuria, polydipsia, no heat or cold intolerance. No recent change in weight. HEMATOLOGICAL: No anemia or easy bruising or bleeding. NEUROLOGIC: No headache, seizures, numbness, tingling or weakness. PSYCHIATRIC: No depression, no loss of interest in normal activity or change in sleep pattern.  Blood pressure 110/70, last menstrual period 09/17/2013.  Physical Exam:  HEENT:unremarkable  Neck:Supple, midline, no thyroid megaly, no carotid bruits  Lungs: Clear to auscultation no rhonchi's or wheezes  Heart:Regular rate and rhythm, no murmurs or gallops  Breast Exam:not done  Abdomen:tender with rebound and guarding right lower quadrant  Pelvic:BUSwithin normal limits  Vagina:no lesions or discharge  Cervix:no lesions or discharge  Uterus:tenderness when deviated  Adnexa:tenderness right adnexa with rebound  Extremities: No cords, no edema  Rectal:the patient very uncomfortable did not allow completion  No results found for this or any  previous visit (from the past 24 hour(s)).  No results found.   Assessment/Plan:  Patient with history of chronic right lower quadrant pain worse in the past few months now with rectal bleeding x2. Patient with past history of proctitis and had blood transfusion in the past. Ultrasound normal x2 one week apart and normal CT scan in the ED recently on March 7. Due to the acute nature patient's pain she'll be taken to the operating room tonight for diagnostic laparoscopy to rule out adnexal torsion, endometriosis, chronic appendix, pelvic adhesions. Patient was counseled for possible right salpingo-oophorectomy, appendectomy, and possible laparotomy.    Preop labs now:  Negative pregnancy test  Results for ESHAAL, SKILLINGS (MRN [id number]) as of 09/28/2013 18:27  Ref. Range 09/28/2013 18:00  Sodium Latest Range: 137-147 mEq/L 140  Potassium Latest Range: 3.7-5.3 mEq/L 3.5 (L)  Chloride Latest Range: 96-112 mEq/L 99  CO2 Latest Range: 19-32 mEq/L 26  BUN Latest Range: 6-23 mg/dL 4 (L)  Creatinine Latest Range: 0.50-1.10 mg/dL 0.49 (L)  Calcium Latest Range: 8.4-10.5 mg/dL 8.3 (L)  GFR calc non Af Amer Latest Range: >90 mL/min >90  GFR calc Af Amer Latest Range: >90 mL/min >90  Glucose Latest Range: 70-99 mg/dL 115 (H)  Alkaline Phosphatase Latest Range: 39-117 U/L 66  Albumin Latest Range: 3.5-5.2 g/dL 3.8  AST Latest Range: 0-37 U/L 33  ALT Latest Range: 0-35 U/L 10  Total Protein Latest Range: 6.0-8.3 g/dL 6.6  Total Bilirubin Latest Range: 0.3-1.2 mg/dL 0.7  WBC Latest Range: 4.6-10.2 K/uL 3.4 (L)  RBC Latest Range: 4.04-5.48 M/uL 3.63 (L)  Hemoglobin Latest Range: 12.0-15.0 g/dL 11.1 (L)  HCT Latest Range: 36.0-46.0 % 32.6 (L)  MCV Latest Range: 80-97 fL 89.8  MCH Latest Range: 27-31.2 pg 30.6  MCHC Latest Range: 31.8-35.4 g/dL 34.0  RDW Latest Range: 11.5-15.5 % 15.0  Platelets Latest Range: 150-400 K/uL 120 (L)     Patient was counseled as to the risk of surgery to include the  following:   1. Infection (prohylactic antibiotics will be administered)  2. DVT/Pulmonary Embolism (prophylactic pneumo compression stockings will be used)  3.Trauma to internal organs requiring additional surgical procedure to repair any injury to  Internal organs requiring perhaps additional hospitalization days.  4.Hemmorhage requiring transfusion and blood products which carry risks such as anaphylactic reaction, hepatitis and AIDS  Patient had received literature information on the procedure scheduled and all her questions were answered and fully accepts all risk.   [facility] HMD5:33 PMTD@Note : This dictation was prepared with Dragon/digital dictation along withSmart phrase technology. Any transcriptional errors that result from this process  are unintentional.  Terrance Mass  09/28/2013, 5:27 PM  Note: This dictation was prepared with Dragon/digital dictation along withSmart phrase technology. Any transcriptional errors that result from this process are unintentional.

## 2013-09-28 NOTE — Transfer of Care (Signed)
Immediate Anesthesia Transfer of Care Note  Patient: Kristen Roberson  Procedure(s) Performed: Procedure(s): LAPAROSCOPY OPERATIVE (N/A) APPENDECTOMY LAPAROSCOPIC (Right) SALPINGO OOPHORECTOMY (Right)  Patient Location: PACU  Anesthesia Type:General  Level of Consciousness: sedated and patient cooperative  Airway & Oxygen Therapy: Patient Spontanous Breathing and Patient connected to nasal cannula oxygen  Post-op Assessment: Report given to PACU RN and Post -op Vital signs reviewed and stable  Post vital signs: Reviewed and stable  Complications: No apparent anesthesia complications

## 2013-09-29 ENCOUNTER — Telehealth: Payer: Self-pay | Admitting: *Deleted

## 2013-09-29 ENCOUNTER — Other Ambulatory Visit: Payer: Self-pay | Admitting: Gynecology

## 2013-09-29 ENCOUNTER — Other Ambulatory Visit: Payer: Self-pay | Admitting: *Deleted

## 2013-09-29 ENCOUNTER — Encounter: Payer: Self-pay | Admitting: *Deleted

## 2013-09-29 ENCOUNTER — Encounter (HOSPITAL_COMMUNITY): Payer: Self-pay | Admitting: Gynecology

## 2013-09-29 DIAGNOSIS — R109 Unspecified abdominal pain: Secondary | ICD-10-CM

## 2013-09-29 DIAGNOSIS — Z9889 Other specified postprocedural states: Secondary | ICD-10-CM

## 2013-09-29 MED ORDER — INFLUENZA VAC SPLIT QUAD 0.5 ML IM SUSP: 0.5000 mL | INTRAMUSCULAR | Status: DC

## 2013-09-29 MED ORDER — INFLUENZA VAC SPLIT QUAD 0.5 ML IM SUSP
0.5000 mL | INTRAMUSCULAR | Status: AC
  Administered 2013-09-29: 0.5 mL via INTRAMUSCULAR
  Filled 2013-09-29: qty 0.5

## 2013-09-29 MED ORDER — KETOROLAC TROMETHAMINE 10 MG PO TABS: 10.0000 mg | ORAL_TABLET | Freq: Four times a day (QID) | ORAL | Status: DC | PRN

## 2013-09-29 MED ORDER — MOVIPREP 100 G PO SOLR: ORAL | Status: DC

## 2013-09-29 NOTE — Progress Notes (Signed)
Pt discharged home with significant other... Condition stable... No equipment... Discharge instructions and instructions for colonoscopy given and reviewed with pt... Taken to car via wheelchair by L. Graylon Good, NT.

## 2013-09-29 NOTE — Telephone Encounter (Signed)
[facility] and spoke with Raquel Sarna, Therapist, sports. Told her of patients appointment and reviewed instructions. Faxed colonoscopy instructions to [phone] for patient. Movieprep sent to pharmacy.

## 2013-09-29 NOTE — Telephone Encounter (Signed)
Left a message for patient to call to confirm receipt of colonoscopy instructions were given at her discharge.

## 2013-09-29 NOTE — Progress Notes (Signed)
Ms Ozbun was in a great deal of pain and she reported that the medication had not made a difference.  I briefly attempted to introduce some relaxation techniques to help her release some tension, but she was not able to do that at this time.   Spiritual Care will attempt follow up later today due to pt frustration.  Louise Pager, [phone] 11:44 AM   09/29/13 1100  Clinical Encounter Type  Visited With Patient and family together  Visit Type Spiritual support  Referral From Nurse  Spiritual Encounters  Spiritual Needs Emotional

## 2013-09-29 NOTE — Progress Notes (Signed)
1 Day Post-Op Procedure(s) (LRB): LAPAROSCOPY OPERATIVE (N/A) APPENDECTOMY LAPAROSCOPIC (Right) SALPINGO OOPHORECTOMY (Right)  Subjective: Patient reports tolerating PO and no problems voiding.    Objective: I have reviewed patient's vital signs, intake and output, medications and labs.  General: alert and cooperative Resp: clear to auscultation bilaterally Cardio: regular rate and rhythm, S1, S2 normal, no murmur, click, rub or gallop GI: soft, non-tender; bowel sounds normal; no masses,  no organomegaly Extremities: extremities normal, atraumatic, no cyanosis or edema Vaginal Bleeding: none  Assessment: s/p Procedure(s): LAPAROSCOPY OPERATIVE (N/A) APPENDECTOMY LAPAROSCOPIC (Right) SALPINGO OOPHORECTOMY (Right): stable Intraoperative findings did not demonstrate any abnormality in the right tube and ovary but it was removed. Her appendix was removed. We had general surgeon Dr. Alphonsa Overall in consultation intraoperatively he did her appendectomy. Pathology report pending. The rest her abdomen and pelvic cavity was normal. There was no evidence of femoral or inguinal hernia as well. Liver and gallbladder appeared normal grossly. All this information was relayed to the patient. I have contacted the GI and I will be contacting her this afternoon for bowel prep and Dr. Olevia Perches will do her colonoscopy tomorrow. Patient with history of proctitis and rectal bleeding in the past and recently. No rectal bleeding reported while in hospital.  Plan: Advance diet Encourage ambulation Advance to PO medication Discontinue IV fluids Discharge home [facility]/ from Dr. Joana Reamer office will be contacting patient today for bowel prep and she is going to be colonoscoped tomorrow She will be given a prescription of Toradol 10 mg one by mouth every 6 hours when necessary. She was instructed to stop the portal approximately 8 hours before her colonoscopy.  LOS: 1 day    Kristen Roberson H 09/29/2013,  9:48 AM

## 2013-09-29 NOTE — Op Note (Signed)
Re:   Kristen Roberson DOB:   02/11/1976 MRN:   [id number]                   FACILITY:  [facility]  DATE OF PROCEDURE: 09/29/2013                              OPERATIVE REPORT  PREOPERATIVE DIAGNOSIS:  Right lower quadrant pain  POSTOPERATIVE DIAGNOSIS:  Normal appearing appendix.  No obvious source of right lower quadrant pain.  PROCEDURE:  Laparoscopic appendectomy.  SURGEON:  Fenton Malling. Lucia Gaskins, MD  ASSISTANTHebert Soho, M.D.  ANESTHESIA:  General endotracheal.  Anesthesiologist: Rudean Curt, MD; Venia Carbon. Royce Macadamia, MD CRNA: Enis Slipper, CRNA  ASA:  2E  ESTIMATED BLOOD LOSS:  Minimal.  DRAINS: none   SPECIMEN:   Appendix  COUNTS CORRECT:  YES  INDICATIONS FOR PROCEDURE: Kristen Roberson is a 38 y.o. (DOB: 1975-10-03) white  female whose primary care doctor is No PCP Per Patient and comes to the OR for exploration for right lower quadrant pain.  Dr. Toney Rakes has removed the right tube and ovary. He has asked my opinion and assistance in evaluating the patient.  She is asleep in room #5 at [facility].  She is in lithotomy with 3 trocars in her abdomen - 5 mm in the RLQ, 11 mm infraumbilical, and 5 mm in the LLQ.  OPERATIVE NOTE:  The patient is under a general endotracheal anesthetic as supervised by Anesthesiologist: Rudean Curt, MD; Venia Carbon. Royce Macadamia, MD CRNA: Enis Slipper, CRNA,   I exchanged the infraumbilical trocar to a 12 mm trocar.  I did an exploration with Dr. Toney Rakes.  He showed me the post resection of the right tube and ovary.  The uterus, left tube and ovary were okay. The right and left lobes of liver unremarkable.  Stomach was unremarkable.  The gall bladder was "robin's egg" blue, not distended or inflamed and covered with a thin veil of omentum.  The colon was unremarkable.  There were no abdominal wall hernias. I ran the last 3 to 4 feet of small bowel and saw no Meckel's diverticula.  I saw no intra-abdominal abnormality.  Photos  were taken of the intra-abdominal organs.  The patient's appendix is located at the right pelvic brim.  It does not look abnormal or inflamed.  But I think with her history of right lower quadrant pain, she would be best served with having the appendix removed.  The mesentery of the appendix was divided with a Harmonic scalpel.  I got to the base of the appendix.  I then used a white load 45 mm Ethicon Endo-GIA stapler and fired this across the base of the appendix.  I placed the appendix in EndoCatch bag and delivered the bag through the umbilical incision.   Dr. Toney Rakes and I looked around again and saw no bleeding.  I assisted Dr. Toney Rakes to close the infraumbilical incision.  He will dictate the remainder of the operation.  Dr. Toney Rakes does plan to keep the patient overnight.   Alphonsa Overall, MD, [facility] Pager: [phone] Office phone:  [phone]

## 2013-09-29 NOTE — Progress Notes (Signed)
Spoke with Rollene Fare, Dr. Nichola Sizer nurse in regards to pt's GI procedure tomorrow... Office will fax over papers for pt review of information prior to procedure.

## 2013-09-29 NOTE — Progress Notes (Signed)
General Surgery Note  LOS: 1 day  POD -  1 Day Post-Op  Assessment/Plan: 1.  Right lower quadrant/right groin pain  LAPAROSCOPY OPERATIVE, APPENDECTOMY LAPAROSCOPIC, Right SALPINGO OOPHORECTOMY - 09/28/2013 - Kristen Roberson  Doing well, but still having pain.  It is early post op.  2.  Mild thrombocytopenia  Plt - 120,000 - 09/28/2013 3.  L5 pars defects with significant anterolisthesis.  Seen on CT scan 09/17/2013   Active Problems:   Postoperative state  Subjective:  Kristen Roberson, in room with patient.  She is still hurting in the RLQ/right groin, but it is early post op.    Objective:   Filed Vitals:   09/29/13 0550  BP: 135/93  Pulse: 85  Temp: 98 F (36.7 C)  Resp: 16     Intake/Output from previous day:  03/12 0701 - 03/13 0700 In: 2380 [P.O.:480; I.V.:1900] Out: 1350 [Urine:1300; Blood:50]  Intake/Output this shift:      Physical Exam:   General: WN WF who is alert and oriented.   But complaining of pain in the RLQ/right groin.   HEENT: Normal. Pupils equal. .   Abdomen: Soft.   Wound: Covered with dressing.   Lab Results:    Recent Labs  09/28/13 1800  WBC 3.4*  HGB 11.1*  HCT 32.6*  PLT 120*    BMET   Recent Labs  09/28/13 1800  NA 140  K 3.5*  CL 99  CO2 26  GLUCOSE 115*  BUN 4*  CREATININE 0.49*  CALCIUM 8.3*    PT/INR  No results found for this basename: LABPROT, INR,  in the last 72 hours  ABG  No results found for this basename: PHART, PCO2, PO2, HCO3,  in the last 72 hours   Studies/Results:  No results found.   Anti-infectives:   Anti-infectives   Start     Dose/Rate Route Frequency Ordered Stop   09/28/13 1900  gentamicin (GARAMYCIN) 270 mg, clindamycin (CLEOCIN) 900 mg in dextrose 5 % 100 mL IVPB     225.5 mL/hr over 30 Minutes Intravenous On call 09/28/13 1855 09/28/13 1955      Alphonsa Overall, MD, FACS Pager: [facility] Office: [phone] 09/29/2013

## 2013-09-29 NOTE — Anesthesia Postprocedure Evaluation (Signed)
  Anesthesia Post-op Note  Anesthesia Post Note  Patient: Kristen Roberson  Procedure(s) Performed: Procedure(s) (LRB): LAPAROSCOPY OPERATIVE (N/A) APPENDECTOMY LAPAROSCOPIC (Right) SALPINGO OOPHORECTOMY (Right)  Anesthesia type: General  Patient location: [facility]  Post pain: Pain level 7/10, pain meds being administered by RN during post op visit  Post assessment: Post-op Vital signs reviewed  Last Vitals:  Filed Vitals:   09/29/13 0550  BP: 135/93  Pulse: 85  Temp: 36.7 C  Resp: 16    Post vital signs: Reviewed  Level of consciousness: sedated  Complications: No apparent anesthesia complications

## 2013-09-29 NOTE — H&P (View-Only) (Signed)
Kristen Roberson Patient presented to the office today complaining of rectal bleeding once again like she did last week along with right lower abdominal pain. My note from 09/22/2013 read as follows:  Patient is a 38 year old gravida 7 para 3 AB 4 (2 spontaneous AB and 2 elective terminations) who was referred to our practice as a courtesy of Dr. Synetta Roberson in reference to patient's right lower quadrant abdominal pains. Patient initially had reported to [facility] long emergency room March 1 complaining of excruciating right lower quadrant pain which had not improved with Percocet. She stated that her last menstrual period was 4 days prior. She was currently not using any form of contraception. She denied any nausea, fever, or chills. Patient does report irregular cycles for the past few years. It was assumed that she was on a Depo-Provera injection but states that she never went in received any injections. Patient does state that she has had history of ovarian cysts in the past in many years ago she had laparoscopic right ovarian cystectomy. She states she has no other medical problems and has been healthy otherwise.  She had reported earlier today a gush of vaginal bleeding right before her cramping started  Patient had the following blood tests and CT done in the ED:  CBC: White blood count 3.1, hemoglobin 11.2 and hematocrit 33. 0  Urine pregnancy test: Negative  UA moderate hemoglobin otherwise negative for infection  CT of abdomen and pelvis:IMPRESSION:  1. No acute intra-abdominal abnormality.  2. L5 pars defects with significant anterolisthesis.   She l presented  March 5 to Dr. Tonette Roberson office with recurrence of the right lower quadrant pain and he referred her to our office for further evaluation.  Patient had stable vital signs with a blood pressure 102/64, pulse 84, temperature 97.6.  We repeated her CBC: White blood count 2.5, hemoglobin 10.9, hematocrit 34.7, platelet count 186,000   When she presented to the office she was complaining of intermittent right lower, pain. She received Toradol 60 mg IM. On abdominal exam she was exquisitely tender in the right lower quadrant pelvic exam was very limited so an ultrasound was done here in our office. The ultrasound demonstrated the following:   The uterus measured 10.0 x 6.4 x 5.0 cm with an endometrial stripe of 7.2 mm. Right and left lower were normal no apparent masses were seen on either adnexa. No free fluid was seen the cul-de-sac prominent endometrial cavity avascular otherwise normal.   Assessment/plan: Patient with history of severe dysmenorrhea and menorrhagia. Symptomatology may be attributed to underlying endometriosis. Normal ultrasound and CT scan. Slightly anemic will be started on iron supplementation. She does not wish to go on a Depo-Provera injection so she will be started on oral contraceptive pills to begin today. I have asked her to return back to the office in one month for followup. I have given her literature and information diagnostic laparoscopy as well as on endometriosis. She was prescribed Toradol 10 mg 1 by mouth every 6 hours for the next 3-5 days that she can take intermittently as needed with the Percocet that was prescribed in the ED. 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 Patient was counseled on the risk and benefits and pros and cons of oral contraceptive pill. She will be placed on a 20  mcg pill. She has taken oral contraceptive pills many years ago without any problems. She denies any personal or family history of any bleeding or clotting disorders.   Patient has informed me that she has had this pain for several years and was diagnosed with proctitis in [facility] last year but has not seen a  gastroenterologist.   Exam today:  Abdomen soft tender right lower abdomen, with guarding and rebound  Pelvic exam Bartholin urethra Skene's within normal limits  Vagina: No lesions or discharge  Cervix: No lesions or discharge  Uterus anteverted mobile no adnexal tenderness on the left but exquisite tenderness on the right was evident with guarding and positioning herself in the fetal position. Rectovaginal exam was not possible patient was very tender in the rectal region.   Patient was given Toradol 60 mg today and an ultrasound was done to compare with 6 days ago and the ultrasound was normal although doppler study not possible due to patient not being able to lay still.   Due to the patient's excruciating pain she'll be taken to the operating room to rule out adnexal torsion and to better visualize her appendix. General surgeon Dr. Lucia Roberson has been alerted to be on standby as well.  Pertinent Gynecological History:  Menses: Irregular  Bleeding: irregular  Contraception: OCP (estrogen/progesterone)  DES exposure: denies  Blood transfusions: patient reports transfusion in the past as a result of rectal bleeding  Sexually transmitted diseases: denies  Previous GYN Procedures: right ovarian cystectomy  Last mammogram: not indicated Date: not indicated  Last pap: normal Date: unknown  OB History: G7, P3 A4  Menstrual History:  Menarche age: 19  Patient's last menstrual period was 09/17/2013.  Past Medical History   Diagnosis  Date   .  Proctitis    .  Cysts of both ovaries    .  Seizures    .  Anemia    .  Anxiety    .  Blood transfusion without reported diagnosis    .  Depression     Past Surgical History   Procedure  Laterality  Date   .  Ovarian cyst removal      Family History   Problem  Relation  Age of Onset   .  Diabetes  Mother    .  Hyperlipidemia  Mother    .  Stroke  Mother    .  Diabetes  Father    Social History: reports that she has never smoked. She does  not have any smokeless tobacco history on file. She reports that she drinks alcohol. She reports that she does not use illicit drugs.  Allergies:  Allergies   Allergen  Reactions   .  Morphine And Related      Anaphylaxis   .  Penicillins      unknown   .  Tramadol      Seizures   (Not in a hospital admission)   REVIEW OF SYSTEMS: A ROS was performed and pertinent positives and negatives are included in the history.  GENERAL: No fevers or chills. HEENT: No change in vision, no earache, sore throat or sinus congestion. NECK: No pain or stiffness. CARDIOVASCULAR: No chest pain or pressure. No palpitations. PULMONARY: No shortness of breath, cough or wheeze. GASTROINTESTINAL: No abdominal pain, nausea, vomiting or diarrhea, melena or bright red blood per rectum. GENITOURINARY: No urinary frequency, urgency, hesitancy or dysuria. MUSCULOSKELETAL: No joint or muscle pain, no back pain, no recent trauma. DERMATOLOGIC:  No rash, no itching, no lesions. ENDOCRINE: No polyuria, polydipsia, no heat or cold intolerance. No recent change in weight. HEMATOLOGICAL: No anemia or easy bruising or bleeding. NEUROLOGIC: No headache, seizures, numbness, tingling or weakness. PSYCHIATRIC: No depression, no loss of interest in normal activity or change in sleep pattern.  Blood pressure 110/70, last menstrual period 09/17/2013.  Physical Exam:  HEENT:unremarkable  Neck:Supple, midline, no thyroid megaly, no carotid bruits  Lungs: Clear to auscultation no rhonchi's or wheezes  Heart:Regular rate and rhythm, no murmurs or gallops  Breast Exam:not done  Abdomen:tender with rebound and guarding right lower quadrant  Pelvic:BUSwithin normal limits  Vagina:no lesions or discharge  Cervix:no lesions or discharge  Uterus:tenderness when deviated  Adnexa:tenderness right adnexa with rebound  Extremities: No cords, no edema  Rectal:the patient very uncomfortable did not allow completion  No results found for this or any  previous visit (from the past 24 hour(s)).  No results found.   Assessment/Plan:  Patient with history of chronic right lower quadrant pain worse in the past few months now with rectal bleeding x2. Patient with past history of proctitis and had blood transfusion in the past. Ultrasound normal x2 one week apart and normal CT scan in the ED recently on March 7. Due to the acute nature patient's pain she'll be taken to the operating room tonight for diagnostic laparoscopy to rule out adnexal torsion, endometriosis, chronic appendix, pelvic adhesions. Patient was counseled for possible right salpingo-oophorectomy, appendectomy, and possible laparotomy.    Preop labs now:  Negative pregnancy test  Results for REDA, PIWOWARSKI (MRN [id number]) as of 09/28/2013 18:27  Ref. Range 09/28/2013 18:00  Sodium Latest Range: 137-147 mEq/L 140  Potassium Latest Range: 3.7-5.3 mEq/L 3.5 (L)  Chloride Latest Range: 96-112 mEq/L 99  CO2 Latest Range: 19-32 mEq/L 26  BUN Latest Range: 6-23 mg/dL 4 (L)  Creatinine Latest Range: 0.50-1.10 mg/dL 0.49 (L)  Calcium Latest Range: 8.4-10.5 mg/dL 8.3 (L)  GFR calc non Af Amer Latest Range: >90 mL/min >90  GFR calc Af Amer Latest Range: >90 mL/min >90  Glucose Latest Range: 70-99 mg/dL 115 (H)  Alkaline Phosphatase Latest Range: 39-117 U/L 66  Albumin Latest Range: 3.5-5.2 g/dL 3.8  AST Latest Range: 0-37 U/L 33  ALT Latest Range: 0-35 U/L 10  Total Protein Latest Range: 6.0-8.3 g/dL 6.6  Total Bilirubin Latest Range: 0.3-1.2 mg/dL 0.7  WBC Latest Range: 4.6-10.2 K/uL 3.4 (L)  RBC Latest Range: 4.04-5.48 M/uL 3.63 (L)  Hemoglobin Latest Range: 12.0-15.0 g/dL 11.1 (L)  HCT Latest Range: 36.0-46.0 % 32.6 (L)  MCV Latest Range: 80-97 fL 89.8  MCH Latest Range: 27-31.2 pg 30.6  MCHC Latest Range: 31.8-35.4 g/dL 34.0  RDW Latest Range: 11.5-15.5 % 15.0  Platelets Latest Range: 150-400 K/uL 120 (L)     Patient was counseled as to the risk of surgery to include the  following:   1. Infection (prohylactic antibiotics will be administered)  2. DVT/Pulmonary Embolism (prophylactic pneumo compression stockings will be used)  3.Trauma to internal organs requiring additional surgical procedure to repair any injury to  Internal organs requiring perhaps additional hospitalization days.  4.Hemmorhage requiring transfusion and blood products which carry risks such as anaphylactic reaction, hepatitis and AIDS  Patient had received literature information on the procedure scheduled and all her questions were answered and fully accepts all risk.   [facility] HMD5:33 PMTD@Note : This dictation was prepared with Dragon/digital dictation along withSmart phrase technology. Any transcriptional errors that result from this process  are unintentional.  Terrance Mass  09/28/2013, 5:27 PM  Note: This dictation was prepared with Dragon/digital dictation along withSmart phrase technology. Any transcriptional errors that result from this process are unintentional.

## 2013-09-29 NOTE — Telephone Encounter (Signed)
Patient's friend called and she did get her instructions. He verbalized understanding.

## 2013-09-29 NOTE — Interval H&P Note (Signed)
History and Physical Interval Note:  09/29/2013 11:54 PM  Kristen Roberson  has presented today for surgery, with the diagnosis of abdominal pain  The various methods of treatment have been discussed with the patient and family. After consideration of risks, benefits and other options for treatment, the patient has consented to  Procedure(s): COLONOSCOPY (N/A) as a surgical intervention .  The patient's history has been reviewed, patient examined, no change in status, stable for surgery.  I have reviewed the patient's chart and labs.  Questions were answered to the patient's satisfaction.     [name]

## 2013-09-29 NOTE — Addendum Note (Signed)
Addendum created 09/29/13 [id number] by Billie Lade, CRNA   Modules edited: Notes Section   Notes Section:  File: [id number]

## 2013-09-29 NOTE — Discharge Summary (Signed)
Physician Discharge Summary  Patient ID: Kristen Roberson MRN: [id number] DOB/AGE: 22-Jan-1976 38 y.o.  Admit date: 09/28/2013 Discharge date: 09/29/2013  Admission Diagnoses: Acute right lower quadrant pain, rectal bleeding  Discharge Diagnoses:  Active Problems:   Postoperative state   Post-operative state   Discharged Condition: stable  Hospital Course: Patient was taken to the operating room on March 12 straight from [facility] emergency room as a result the patient's acute right lower abdominal pains which she was experiencing. The patient also had complained of rectal bleeding earlier that day the same that she had a week prior. The patient had a normal ultrasound in the office and had had a CT scan in the emergency room over a week ago which also was normal. The patient underwent diagnostic laparoscopy with right salpingo-oophorectomy and appendectomy. A normal pelvis was noted. Right tube and no bruit were normal as was the appendix. Gen. surgeon consultation with Dr. Alphonsa Overall was undertaken and he removed the appendix. No other abnormality in the abdomen or pelvis is noted. She was kept overnight for pain management. Patient had minimal blood loss during her surgery and her preop hemoglobin hematocrit were 11.1 and 32.6 with a platelet count of 20,000. On her first postoperative day her PCA pump had been removed as was her Foley catheter and she was started on a regular diet and was read to be discharged home. She continued to have the right lower abdominal pains colicky in nature and arrangements were made for her to have a colonoscopy as an outpatient the following morning with Dr. Olevia Perches.  Consults: general surgery  Significant Diagnostic Studies: labs: Hemoglobin hematocrit 11.1 and 32.6 respectively with a platelet count 120,000  Treatments: surgery: #1 diagnostic laparoscopy, #2 right salpingo-oophorectomy, #3 appendectomy  Discharge Exam: Blood pressure 136/94, pulse 95,  temperature 97.9 F (36.6 C), temperature source Oral, resp. rate 18, height 5\' 6"  (1.676 m), weight 120 lb (54.432 kg), last menstrual period 09/17/2013, SpO2 99.00%.  ports intact  Lungs: Clear to auscultation Heart: Regular rate and rhythm no murmurs or gallops Abdomen: Port sites intact, abdomen soft, no rebound but tenderness was noted in the right lower abdomen same as preop Pelvic exam: Not done Vagina: No vaginal bleeding on pad Extremities: No cords or edema  Disposition: 01-Home or Self Care  Discharge Orders   Future Appointments Provider Department Dept Phone   10/17/2013 9:30 AM Terrance Mass, MD [facility] [phone]   Future Orders Complete By Expires   Call MD for:  redness, tenderness, or signs of infection (pain, swelling, bleeding, redness, odor or green/yellow discharge around incision site)  As directed    Call MD for:  severe or increased pain, loss or decreased feeling  in affected limb(s)  As directed    Call MD for:  temperature >100.5  As directed    Discharge instructions  As directed    Comments:     Post op appointment with Dr. Toney Rakes March 31 at 9:30 AM  [facility]/ Dr. Nichola Sizer office will be calling later today to schedule colonoscopy for tomorrow as well as bowel prep instructions  General Postsurgical Instructions, Adult  You may expect to feel dizzy, weak, and drowsy for as long as 24 hours after receiving the medicine that made you sleep (anesthetic). The following information pertains to your recovery period for the first 24 hours following surgery. Do not drive a car, ride a bicycle, participate in physical activities, or take public transportation until you  are done taking narcotic pain medicines or as directed by your caregiver.  Do not drink alcohol or take tranquilizers.  Do not take medicine that has not been prescribed by your caregiver.  Do not sign important papers or make important decisions while on narcotic  pain medicines.  Have a responsible person with you.  CARE OF INCISION Change bandages (dressings) as directed.  Take showers instead of baths until your caregiver gives you permission to take baths. Check with your caregiver if you have tubes coming from the wound site.  Avoid heavy lifting (more than 10 pounds/4.5 kilograms), pushing, or pulling.  Avoid activities that may risk injury to your surgical site.  Only take over-the-counter or prescription medicines for pain, discomfort, or fever as directed by your caregiver. Do not take aspirin. It can make you bleed. Take medicines (antibiotics) that kill germs as directed.  SEEK MEDICAL CARE IF: You feel sick to your stomach (nauseous).  You start to throw up (vomit).  You have trouble eating or drinking.  You have an oral temperature above 100.  You have constipation that is not helped by adjusting diet or increasing fluid intake. Pain medicines are a common cause of constipation.  SEEK IMMEDIATE MEDICAL CARE IF: You have persistent dizziness.  You have difficulty breathing or a congested sounding (croupy) cough.  You have an oral temperature above 100, not controlled by medicine.  There is increasing pain or tenderness near or in the surgical site.  Document Released: 10/18/2000 Document Re-Released: 09/30/2009 [facility] Patient Information 2011 [place].   [facility] HMD11:08 AMTD@   Driving Restrictions  As directed    Comments:     No driving for 1 weeks   Lifting restrictions  As directed    Comments:     No lifting for 2 weeks   Resume previous diet  As directed        Medication List    STOP taking these medications       ciprofloxacin 500 MG tablet  Commonly known as:  CIPRO      TAKE these medications       hydrocortisone cream 1 %  Apply 1 application topically daily as needed for itching (For itching.).     influenza vac split quadrivalent PF 0.5 ML injection  Commonly known as:  FLUARIX  Inject  0.5 mLs into the muscle tomorrow at 10 am.  Start taking on:  09/30/2013     norethindrone-ethinyl estradiol 1-20 MG-MCG tablet  Commonly known as:  JUNEL FE 1/20  Take 1 tablet by mouth daily.     oxyCODONE-acetaminophen 5-325 MG per tablet  Commonly known as:  PERCOCET/ROXICET  Take 1 tablet by mouth every 6 (six) hours as needed for severe pain.     traZODone 100 MG tablet  Commonly known as:  DESYREL  Take 100 mg by mouth at bedtime as needed for sleep.      ASK your doctor about these medications       ketorolac 10 MG tablet  Commonly known as:  TORADOL  Take 1 tablet (10 mg total) by mouth every 6 (six) hours as needed.     MOVIPREP 100 G Solr  Generic drug:  peg 3350 powder  Take as directed         Signed: Terrance Mass 09/29/2013, 1:09 PM

## 2013-09-30 ENCOUNTER — Encounter (HOSPITAL_COMMUNITY)
Admission: RE | Disposition: A | Discharge status: Home / Self Care | Payer: Self-pay | Source: Ambulatory Visit | Attending: Internal Medicine

## 2013-09-30 ENCOUNTER — Encounter (HOSPITAL_COMMUNITY): Payer: Self-pay

## 2013-09-30 ENCOUNTER — Ambulatory Visit (HOSPITAL_COMMUNITY)
Admission: RE | Admit: 2013-09-30 | Discharge: 2013-09-30 | Disposition: A | Discharge status: Home / Self Care | Source: Ambulatory Visit | Attending: Internal Medicine | Admitting: Internal Medicine

## 2013-09-30 DIAGNOSIS — K625 Hemorrhage of anus and rectum: Secondary | ICD-10-CM | POA: Insufficient documentation

## 2013-09-30 DIAGNOSIS — K921 Melena: Secondary | ICD-10-CM | POA: Insufficient documentation

## 2013-09-30 DIAGNOSIS — R1031 Right lower quadrant pain: Secondary | ICD-10-CM | POA: Insufficient documentation

## 2013-09-30 DIAGNOSIS — R109 Unspecified abdominal pain: Secondary | ICD-10-CM

## 2013-09-30 DIAGNOSIS — Z9079 Acquired absence of other genital organ(s): Secondary | ICD-10-CM | POA: Insufficient documentation

## 2013-09-30 DIAGNOSIS — Z9089 Acquired absence of other organs: Secondary | ICD-10-CM | POA: Insufficient documentation

## 2013-09-30 HISTORY — PX: COLONOSCOPY: SHX5424

## 2013-09-30 SURGERY — COLONOSCOPY
Anesthesia: Moderate Sedation

## 2013-09-30 MED ORDER — DIPHENHYDRAMINE HCL 50 MG/ML IJ SOLN
INTRAMUSCULAR | Status: AC
  Filled 2013-09-30: qty 1

## 2013-09-30 MED ORDER — MIDAZOLAM HCL 5 MG/5ML IJ SOLN
INTRAMUSCULAR | Status: DC | PRN
Start: 2013-09-30 — End: 2013-09-30
  Administered 2013-09-30: 3 mg via INTRAVENOUS
  Administered 2013-09-30: 2 mg via INTRAVENOUS
  Administered 2013-09-30: 1 mg via INTRAVENOUS
  Administered 2013-09-30 (×2): 2 mg via INTRAVENOUS

## 2013-09-30 MED ORDER — FENTANYL CITRATE 0.05 MG/ML IJ SOLN
INTRAMUSCULAR | Status: AC
  Filled 2013-09-30: qty 2

## 2013-09-30 MED ORDER — MIDAZOLAM HCL 10 MG/2ML IJ SOLN
INTRAMUSCULAR | Status: AC
  Filled 2013-09-30: qty 2

## 2013-09-30 MED ORDER — SODIUM CHLORIDE 0.9 % IV SOLN: INTRAVENOUS | Status: DC

## 2013-09-30 MED ORDER — DIPHENHYDRAMINE HCL 50 MG/ML IJ SOLN
INTRAMUSCULAR | Status: DC | PRN
  Administered 2013-09-30: 25 mg via INTRAVENOUS

## 2013-09-30 MED ORDER — FENTANYL CITRATE 0.05 MG/ML IJ SOLN
INTRAMUSCULAR | Status: DC | PRN
  Administered 2013-09-30 (×3): 25 ug via INTRAVENOUS
  Administered 2013-09-30 (×2): 12.5 ug via INTRAVENOUS

## 2013-09-30 MED ORDER — HYDROCORTISONE ACETATE 25 MG RE SUPP: 25.0000 mg | Freq: Two times a day (BID) | RECTAL | Status: DC

## 2013-09-30 NOTE — Op Note (Signed)
[facility] [place], [address]   COLONOSCOPY PROCEDURE REPORT  PATIENT: Kristen Roberson, Kristen Roberson  MR#: [id number] BIRTHDATE: [birth year] , 37  yrs. old GENDER: Female ENDOSCOPIST: Lafayette Dragon, MD REFERRED IW:LNLG Toney Rakes, M.D. PROCEDURE DATE:  09/30/2013 PROCEDURE:   Colonoscopy with biopsy First Screening Colonoscopy - Avg.  risk and is 50 yrs.  old or older - No.  Prior Negative Screening - Now for repeat screening. N/A  History of Adenoma - Now for follow-up colonoscopy & has been > or = to 3 yrs.  N/A  Polyps Removed Today? No.  Recommend repeat exam, <10 yrs? No. ASA CLASS:   Class II INDICATIONS:low-volume hematochezia.  Acute abdominal pain.  Status post recent appendectomy and oophorectomy, no evidence of endometriosis.  Prior colonoscopy elsewhere showed proctitis. MEDICATIONS: These medications were titrated to patient response per physician's verbal order, Benadryl 25 mg IV, Fentanyl 100 mcg IV, and Versed 10 mg IV  DESCRIPTION OF PROCEDURE:   After the risks benefits and alternatives of the procedure were thoroughly explained, informed consent was obtained.  A digital rectal exam revealed no abnormalities of the rectum.   The     endoscope was introduced through the anus and advanced to the cecum, which was identified by both the appendix and ileocecal valve. No adverse events experienced.   The quality of the prep was good, using MoviPrep The instrument was then slowly withdrawn as the colon was fully examined.      COLON FINDINGS: A normal appearing cecum, ileocecal valve, and appendiceal orifice were identified.  The ascending, hepatic flexure, transverse, splenic flexure, descending, sigmoid colon and rectum appeared unremarkable.  No polyps or cancers were seen. Multiple biopsies were performed.  Retroflexed views revealed no abnormalities. The time to cecum=5 minutes 250 seconds.  Withdrawal time=7 minutes 20 seconds.  The scope  was withdrawn and the procedure completed. COMPLICATIONS: There were no complications.  ENDOSCOPIC IMPRESSION: Normal colon; multiple biopsies were performed of normal appearing rectal mucosa  RECOMMENDATIONS:  nothing to account for pre-op  abdominal pain, the procedure was painful because of  recent  laparoscopic surgery 48 hours ago, I did not attempt to intubate terminal ileum because of recent appendectomy and associated discomfort with insufflation  and risk of perforation. There was no active proctitis - biopsies pending  Anusol HC supp 1 hs x 10 days Follow up with [place] in 4-6 weeks to discuss further disposition concerning her abdominal pain  eSigned:  Lafayette Dragon, MD 09/30/2013 11:11 AM   cc:

## 2013-09-30 NOTE — Interval H&P Note (Signed)
History and Physical Interval Note:  09/30/2013 10:27 AM  Kristen Roberson  has presented today for surgery, with the diagnosis of abdominal pain  The various methods of treatment have been discussed with the patient and family. After consideration of risks, benefits and other options for treatment, the patient has consented to  Procedure(s): COLONOSCOPY (N/A) as a surgical intervention .  The patient's history has been reviewed, patient examined, no change in status, stable for surgery.  I have reviewed the patient's chart and labs.  Questions were answered to the patient's satisfaction.     [name]

## 2013-09-30 NOTE — Discharge Instructions (Signed)
Colonoscopy A colonoscopy is an exam to look at the entire large intestine (colon). This exam can help find problems such as tumors, polyps, inflammation, and areas of bleeding. The exam takes about 1 hour.  [facility] CARE PROVIDER KNOW ABOUT:   Any allergies you have.  All medicines you are taking, including vitamins, herbs, eye drops, creams, and over-the-counter medicines.  Previous problems you or members of your family have had with the use of anesthetics.  Any blood disorders you have.  Previous surgeries you have had.  Medical conditions you have. RISKS AND COMPLICATIONS  Generally, this is a safe procedure. However, as with any procedure, complications can occur. Possible complications include:  Bleeding.  Tearing or rupture of the colon wall.  Reaction to medicines given during the exam.  Infection (rare). BEFORE THE PROCEDURE   Ask your health care provider about changing or stopping your regular medicines.  You may be prescribed an oral bowel prep. This involves drinking a large amount of medicated liquid, starting the day before your procedure. The liquid will cause you to have multiple loose stools until your stool is almost clear or light green. This cleans out your colon in preparation for the procedure.  Do not eat or drink anything else once you have started the bowel prep, unless your health care provider tells you it is safe to do so.  Arrange for someone to drive you home after the procedure. PROCEDURE   You will be given medicine to help you relax (sedative).  You will lie on your side with your knees bent.  A long, flexible tube with a light and camera on the end (colonoscope) will be inserted through the rectum and into the colon. The camera sends video back to a computer screen as it moves through the colon. The colonoscope also releases carbon dioxide gas to inflate the colon. This helps your health care provider see the area better.  During  the exam, your health care provider may take a small tissue sample (biopsy) to be examined under a microscope if any abnormalities are found.  The exam is finished when the entire colon has been viewed. AFTER THE PROCEDURE   Do not drive for 24 hours after the exam.  You may have a small amount of blood in your stool.  You may pass moderate amounts of gas and have mild abdominal cramping or bloating. This is caused by the gas used to inflate your colon during the exam.  Ask when your test results will be ready and how you will get your results. Make sure you get your test results. Document Released: 07/03/2000 Document Revised: 04/26/2013 Document Reviewed: 03/13/2013 [facility] Patient Information 2014 [place].  Conscious Sedation, Adult, Care After Refer to this sheet in the next few weeks. These instructions provide you with information on caring for yourself after your procedure. Your health care provider may also give you more specific instructions. Your treatment has been planned according to current medical practices, but problems sometimes occur. Call your health care provider if you have any problems or questions after your procedure. WHAT TO EXPECT AFTER THE PROCEDURE  After your procedure:  You may feel sleepy, clumsy, and have poor balance for several hours.  Vomiting may occur if you eat too soon after the procedure. HOME CARE INSTRUCTIONS  Do not participate in any activities where you could become injured for at least 24 hours. Do not:  Drive.  Swim.  Ride a bicycle.  Operate heavy  machinery.  Cook.  Use power tools.  Climb ladders.  Work from a high place.  Do not make important decisions or sign legal documents until you are improved.  If you vomit, drink water, juice, or soup when you can drink without vomiting. Make sure you have little or no nausea before eating solid foods.  Only take over-the-counter or prescription medicines for pain,  discomfort, or fever as directed by your health care provider.  Make sure you and your family fully understand everything about the medicines given to you, including what side effects may occur.  You should not drink alcohol, take sleeping pills, or take medicines that cause drowsiness for at least 24 hours.  If you smoke, do not smoke without supervision.  If you are feeling better, you may resume normal activities 24 hours after you were sedated.  Keep all appointments with your health care provider. SEEK MEDICAL CARE IF:  Your skin is pale or bluish in color.  You continue to feel nauseous or vomit.  Your pain is getting worse and is not helped by medicine.  You have bleeding or swelling.  You are still sleepy or feeling clumsy after 24 hours. SEEK IMMEDIATE MEDICAL CARE IF:  You develop a rash.  You have difficulty breathing.  You develop any type of allergic problem.  You have a fever. MAKE SURE YOU:  Understand these instructions.  Will watch your condition.  Will get help right away if you are not doing well or get worse. Document Released: 04/26/2013 Document Reviewed: 02/10/2013 [facility] Patient Information 2014 [place], [place].

## 2013-10-02 ENCOUNTER — Encounter (HOSPITAL_COMMUNITY): Payer: Self-pay | Admitting: Internal Medicine

## 2013-10-02 NOTE — Telephone Encounter (Signed)
Pt saw Dr.Bodie regarding the below.

## 2013-10-03 ENCOUNTER — Encounter: Payer: Self-pay | Admitting: *Deleted

## 2013-10-03 ENCOUNTER — Encounter: Payer: Self-pay | Admitting: Internal Medicine

## 2013-10-05 ENCOUNTER — Emergency Department (HOSPITAL_COMMUNITY)

## 2013-10-05 ENCOUNTER — Emergency Department (HOSPITAL_COMMUNITY)
Admission: EM | Admit: 2013-10-05 | Discharge: 2013-10-06 | Disposition: A | Discharge status: Home / Self Care | Attending: Emergency Medicine | Admitting: Emergency Medicine

## 2013-10-05 ENCOUNTER — Encounter (HOSPITAL_COMMUNITY): Payer: Self-pay | Admitting: Emergency Medicine

## 2013-10-05 DIAGNOSIS — R109 Unspecified abdominal pain: Secondary | ICD-10-CM

## 2013-10-05 DIAGNOSIS — R102 Pelvic and perineal pain: Secondary | ICD-10-CM

## 2013-10-05 DIAGNOSIS — F411 Generalized anxiety disorder: Secondary | ICD-10-CM | POA: Insufficient documentation

## 2013-10-05 DIAGNOSIS — Z79899 Other long term (current) drug therapy: Secondary | ICD-10-CM | POA: Insufficient documentation

## 2013-10-05 DIAGNOSIS — IMO0002 Reserved for concepts with insufficient information to code with codable children: Secondary | ICD-10-CM | POA: Insufficient documentation

## 2013-10-05 DIAGNOSIS — A499 Bacterial infection, unspecified: Secondary | ICD-10-CM | POA: Insufficient documentation

## 2013-10-05 DIAGNOSIS — G8929 Other chronic pain: Secondary | ICD-10-CM | POA: Insufficient documentation

## 2013-10-05 DIAGNOSIS — K921 Melena: Secondary | ICD-10-CM | POA: Insufficient documentation

## 2013-10-05 DIAGNOSIS — B9689 Other specified bacterial agents as the cause of diseases classified elsewhere: Secondary | ICD-10-CM | POA: Insufficient documentation

## 2013-10-05 DIAGNOSIS — Z862 Personal history of diseases of the blood and blood-forming organs and certain disorders involving the immune mechanism: Secondary | ICD-10-CM | POA: Insufficient documentation

## 2013-10-05 DIAGNOSIS — R197 Diarrhea, unspecified: Secondary | ICD-10-CM | POA: Insufficient documentation

## 2013-10-05 DIAGNOSIS — F329 Major depressive disorder, single episode, unspecified: Secondary | ICD-10-CM | POA: Insufficient documentation

## 2013-10-05 DIAGNOSIS — Z3202 Encounter for pregnancy test, result negative: Secondary | ICD-10-CM | POA: Insufficient documentation

## 2013-10-05 DIAGNOSIS — Z880 Allergy status to penicillin: Secondary | ICD-10-CM | POA: Insufficient documentation

## 2013-10-05 DIAGNOSIS — F3289 Other specified depressive episodes: Secondary | ICD-10-CM | POA: Insufficient documentation

## 2013-10-05 DIAGNOSIS — K6289 Other specified diseases of anus and rectum: Secondary | ICD-10-CM | POA: Insufficient documentation

## 2013-10-05 DIAGNOSIS — N760 Acute vaginitis: Secondary | ICD-10-CM | POA: Insufficient documentation

## 2013-10-05 DIAGNOSIS — K760 Fatty (change of) liver, not elsewhere classified: Secondary | ICD-10-CM

## 2013-10-05 HISTORY — DX: Fatty (change of) liver, not elsewhere classified: K76.0

## 2013-10-05 LAB — CBC WITH DIFFERENTIAL/PLATELET
BASOS PCT: 1 % (ref 0–1)
Basophils Absolute: 0 10*3/uL (ref 0.0–0.1)
EOS ABS: 0 10*3/uL (ref 0.0–0.7)
Eosinophils Relative: 0 % (ref 0–5)
HEMATOCRIT: 30.2 % — AB (ref 36.0–46.0)
HEMOGLOBIN: 10.4 g/dL — AB (ref 12.0–15.0)
Lymphocytes Relative: 45 % (ref 12–46)
Lymphs Abs: 1 10*3/uL (ref 0.7–4.0)
MCH: 31.2 pg (ref 26.0–34.0)
MCHC: 34.4 g/dL (ref 30.0–36.0)
MCV: 90.7 fL (ref 78.0–100.0)
MONO ABS: 0.2 10*3/uL (ref 0.1–1.0)
MONOS PCT: 9 % (ref 3–12)
NEUTROS ABS: 1 10*3/uL — AB (ref 1.7–7.7)
Neutrophils Relative %: 45 % (ref 43–77)
Platelets: 122 10*3/uL — ABNORMAL LOW (ref 150–400)
RBC: 3.33 MIL/uL — ABNORMAL LOW (ref 3.87–5.11)
RDW: 16.5 % — ABNORMAL HIGH (ref 11.5–15.5)
WBC: 2.3 10*3/uL — ABNORMAL LOW (ref 4.0–10.5)

## 2013-10-05 LAB — COMPREHENSIVE METABOLIC PANEL
ALBUMIN: 3.7 g/dL (ref 3.5–5.2)
ALT: 13 U/L (ref 0–35)
AST: 51 U/L — ABNORMAL HIGH (ref 0–37)
Alkaline Phosphatase: 83 U/L (ref 39–117)
BUN: 4 mg/dL — ABNORMAL LOW (ref 6–23)
CHLORIDE: 103 meq/L (ref 96–112)
CO2: 26 mEq/L (ref 19–32)
Calcium: 8.3 mg/dL — ABNORMAL LOW (ref 8.4–10.5)
Creatinine, Ser: 0.47 mg/dL — ABNORMAL LOW (ref 0.50–1.10)
GFR calc Af Amer: >90 mL/min (ref >90)
GFR calc non Af Amer: >90 mL/min (ref >90)
Glucose, Bld: 122 mg/dL — ABNORMAL HIGH (ref 70–99)
Potassium: 3.6 mEq/L — ABNORMAL LOW (ref 3.7–5.3)
Sodium: 145 mEq/L (ref 137–147)
Total Bilirubin: 0.5 mg/dL (ref 0.3–1.2)
Total Protein: 6.7 g/dL (ref 6.0–8.3)

## 2013-10-05 LAB — URINALYSIS, ROUTINE W REFLEX MICROSCOPIC
Bilirubin Urine: NEGATIVE
Glucose, UA: NEGATIVE mg/dL
Ketones, ur: NEGATIVE mg/dL
Nitrite: NEGATIVE
PH: 7.5 (ref 5.0–8.0)
PROTEIN: NEGATIVE mg/dL
Specific Gravity, Urine: 1.02 (ref 1.005–1.030)
Urobilinogen, UA: 0.2 mg/dL (ref 0.0–1.0)

## 2013-10-05 LAB — URINE MICROSCOPIC-ADD ON

## 2013-10-05 LAB — POC URINE PREG, ED: Preg Test, Ur: NEGATIVE

## 2013-10-05 LAB — I-STAT TROPONIN, ED: TROPONIN I, POC: 0 ng/mL (ref 0.00–0.08)

## 2013-10-05 LAB — LIPASE, BLOOD: LIPASE: 26 U/L (ref 11–59)

## 2013-10-05 LAB — POC OCCULT BLOOD, ED: Fecal Occult Bld: NEGATIVE

## 2013-10-05 MED ORDER — IOHEXOL 300 MG/ML  SOLN
100.0000 mL | Freq: Once | INTRAMUSCULAR | Status: AC | PRN
  Administered 2013-10-05: 80 mL via INTRAVENOUS

## 2013-10-05 MED ORDER — HYDROMORPHONE HCL PF 1 MG/ML IJ SOLN
1.0000 mg | Freq: Once | INTRAMUSCULAR | Status: AC
  Administered 2013-10-05: 1 mg via INTRAVENOUS
  Filled 2013-10-05: qty 1

## 2013-10-05 MED ORDER — IOHEXOL 300 MG/ML  SOLN
50.0000 mL | Freq: Once | INTRAMUSCULAR | Status: AC | PRN
  Administered 2013-10-05: 50 mL via ORAL

## 2013-10-05 MED ORDER — FENTANYL CITRATE 0.05 MG/ML IJ SOLN
50.0000 ug | Freq: Once | INTRAMUSCULAR | Status: AC
Start: 2013-10-05 — End: 2013-10-05
  Administered 2013-10-05: 50 ug via INTRAVENOUS
  Filled 2013-10-05: qty 2

## 2013-10-05 MED ORDER — ONDANSETRON HCL 4 MG/2ML IJ SOLN
4.0000 mg | Freq: Once | INTRAMUSCULAR | Status: AC
  Administered 2013-10-05: 4 mg via INTRAVENOUS
  Filled 2013-10-05: qty 2

## 2013-10-05 NOTE — ED Provider Notes (Signed)
CSN: [id number]     Arrival date & time 10/05/13  1935 History   First MD Initiated Contact with Patient 10/05/13 1951     Chief Complaint  Patient presents with  . Abdominal Pain  . Rectal Pain     (Consider location/radiation/quality/duration/timing/severity/associated sxs/prior Treatment) HPI Comments: Patient is a 38 year old female who presents today with sudden onset, severe abdominal pain. The pain radiates from her back to her right lower quadrant and is a sharp stabbing pain. She reports she has had significant diarrhea since the pain started. She reports she has been having black tarry stools. Initially she states this is a different pain than the chronic abdominal pain she normally experiences, but then states she is unsure why she is having the same pain she had prior to her surgery. On 3/13 she had an appendectomy and right salpingo-oophorectomy. On review of op note it appears there were no abnormalities noted. Additionally she had a colonoscopy done the next day. She denies fevers, chills, nausea, vomiting, vaginal discharge, vaginal bleeding, urinary urgency, urinary frequency.  Patient is a 38 y.o. female presenting with abdominal pain. The history is provided by the patient. No language interpreter was used.  Abdominal Pain Associated symptoms: diarrhea   Associated symptoms: no chest pain, no chills, no fever, no nausea, no shortness of breath, no vaginal bleeding, no vaginal discharge and no vomiting     Past Medical History  Diagnosis Date  . Proctitis   . Cysts of both ovaries   . Seizures   . Anemia   . Anxiety   . Blood transfusion without reported diagnosis   . Depression    Past Surgical History  Procedure Laterality Date  . Ovarian cyst removal    . Laparoscopy N/A 09/28/2013    Procedure: LAPAROSCOPY OPERATIVE;  Surgeon: Terrance Mass, MD;  Location: [facility];  Service: Gynecology;  Laterality: N/A;  . Laparoscopic appendectomy Right 09/28/2013   Procedure: APPENDECTOMY LAPAROSCOPIC;  Surgeon: Terrance Mass, MD;  Location: [facility];  Service: Gynecology;  Laterality: Right;  . Salpingoophorectomy Right 09/28/2013    Procedure: SALPINGO OOPHORECTOMY;  Surgeon: Terrance Mass, MD;  Location: [facility];  Service: Gynecology;  Laterality: Right;  . Colonoscopy N/A 09/30/2013    Procedure: COLONOSCOPY;  Surgeon: Lafayette Dragon, MD;  Location: [facility];  Service: Endoscopy;  Laterality: N/A;   Family History  Problem Relation Age of Onset  . Diabetes Mother   . Hyperlipidemia Mother   . Stroke Mother   . Diabetes Father    History  Substance Use Topics  . Smoking status: Never Smoker   . Smokeless tobacco: Not on file  . Alcohol Use: Yes   OB History   Grav Para Term Preterm Abortions TAB SAB Ect Mult Living   7 3   4  4   3      Review of Systems  Constitutional: Negative for fever and chills.  Respiratory: Negative for shortness of breath.   Cardiovascular: Negative for chest pain.  Gastrointestinal: Positive for abdominal pain, diarrhea and blood in stool. Negative for nausea and vomiting.  Genitourinary: Positive for pelvic pain. Negative for frequency, vaginal bleeding and vaginal discharge.  All other systems reviewed and are negative.      Allergies  Morphine and related; Tramadol; and Penicillins  Home Medications   Current Outpatient Rx  Name  Route  Sig  Dispense  Refill  . hydrocortisone (ANUSOL-HC) 25 MG suppository   Rectal  Place 1 suppository (25 mg total) rectally 2 (two) times daily.   12 suppository   0   . hydrocortisone cream 1 %   Topical   Apply 1 application topically daily as needed for itching (For itching.).         Marland Kitchen ketorolac (TORADOL) 10 MG tablet   Oral   Take 1 tablet (10 mg total) by mouth every 6 (six) hours as needed.   20 tablet   0   . MOVIPREP 100 G SOLR      Take as directed   1 kit   0     Dispense as written.   . norethindrone-ethinyl estradiol (JUNEL FE  1/20) 1-20 MG-MCG tablet   Oral   Take 1 tablet by mouth daily.   1 Package   11   . oxyCODONE-acetaminophen (PERCOCET/ROXICET) 5-325 MG per tablet   Oral   Take 1 tablet by mouth every 6 (six) hours as needed for severe pain.   30 tablet   0   . traZODone (DESYREL) 100 MG tablet   Oral   Take 100 mg by mouth at bedtime as needed for sleep.          BP 121/69  Pulse 88  Temp(Src) 98 F (36.7 C) (Oral)  Resp 16  SpO2 97%  LMP 08/20/2013 Physical Exam  Nursing note and vitals reviewed. Constitutional: She is oriented to person, place, and time. She appears well-developed and well-nourished. She does not appear ill. She appears distressed.  Patient writing around on bed intermittently. She appears very distractable. She continues to yell, "give me something for pain!" and "make the pain stop"  HENT:  Head: Normocephalic and atraumatic.  Right Ear: External ear normal.  Left Ear: External ear normal.  Nose: Nose normal.  Mouth/Throat: Oropharynx is clear and moist.  Eyes: Conjunctivae are normal.  Neck: Normal range of motion.  Cardiovascular: Normal rate, regular rhythm and normal heart sounds.   Pulmonary/Chest: Effort normal and breath sounds normal. No stridor. No respiratory distress. She has no wheezes. She has no rales.  Abdominal: Soft. Bowel sounds are normal. She exhibits no distension. There is tenderness. There is guarding (voluntary). There is no rigidity and no rebound.  Well healing surgical incision site. Abd is soft and diffusely tender.   Genitourinary: Rectal exam shows tenderness. Rectal exam shows anal tone normal. Guaiac negative stool. There is no rash, tenderness, lesion or injury on the right labia. There is no rash, tenderness, lesion or injury on the left labia. Cervix exhibits no friability. Right adnexum displays tenderness. Right adnexum displays no mass and no fullness. Left adnexum displays tenderness. Left adnexum displays no mass and no  fullness. No bleeding around the vagina. Vaginal discharge found.  Multiple skin tags on anus. Tenderness on rectal exam. No blood on pelvic exam. There is a cloudy, thin, fluid like discharge.   Musculoskeletal: Normal range of motion.  Neurological: She is alert and oriented to person, place, and time. She has normal strength.  Skin: Skin is warm and dry. She is not diaphoretic. No erythema.  Psychiatric: Her affect is labile.    ED Course  Procedures (including critical care time) Labs Review Labs Reviewed  WET PREP, GENITAL - Abnormal; Notable for the following:    Clue Cells Wet Prep HPF POC MODERATE (*)    WBC, Wet Prep HPF POC MANY (*)    All other components within normal limits  CBC WITH DIFFERENTIAL - Abnormal; Notable for the  following:    WBC 2.3 (*)    RBC 3.33 (*)    Hemoglobin 10.4 (*)    HCT 30.2 (*)    RDW 16.5 (*)    Platelets 122 (*)    Neutro Abs 1.0 (*)    All other components within normal limits  COMPREHENSIVE METABOLIC PANEL - Abnormal; Notable for the following:    Potassium 3.6 (*)    Glucose, Bld 122 (*)    BUN 4 (*)    Creatinine, Ser 0.47 (*)    Calcium 8.3 (*)    AST 51 (*)    All other components within normal limits  URINALYSIS, ROUTINE W REFLEX MICROSCOPIC - Abnormal; Notable for the following:    APPearance CLOUDY (*)    Hgb urine dipstick TRACE (*)    Leukocytes, UA MODERATE (*)    All other components within normal limits  GC/CHLAMYDIA PROBE AMP  LIPASE, BLOOD  URINE MICROSCOPIC-ADD ON  POC URINE PREG, ED  POC OCCULT BLOOD, ED  I-STAT TROPOININ, ED   Imaging Review Ct Abdomen Pelvis W Contrast  10/05/2013   CLINICAL DATA:  Abdominal and rectal pain. History of proctitis. Rectal bleeding. Status post resection of the right fallopian tube, ovary and appendix 09/29/2013. Status post colonoscopy 09/30/2013.  EXAM: CT ABDOMEN AND PELVIS WITH CONTRAST  TECHNIQUE: Multidetector CT imaging of the abdomen and pelvis was performed using the  standard protocol following bolus administration of intravenous contrast.  CONTRAST:  50 mL OMNIPAQUE IOHEXOL 300 MG/ML SOLN, 80 mL OMNIPAQUE IOHEXOL 300 MG/ML SOLN  COMPARISON:  CT abdomen and pelvis 09/17/2013.  FINDINGS: The lung bases are clear.  No pleural or pericardial effusion.  The liver is low attenuating compatible with fatty infiltration. No focal lesion. The gallbladder, adrenal glands, spleen, pancreas and kidneys appear normal. Uterus and left ovary appear normal. There is a trace amount of free pelvic fluid. The appendix has been removed. The colon is normal in appearance. The stomach and small bowel also appear normal. The urinary bladder is distended but otherwise unremarkable. 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  IMPRESSION: No acute finding or finding to explain the patient's symptoms.  Fatty infiltration of the liver.  Status post appendectomy and removal of the right ovary without evidence of complication.  Trace amount of free pelvic fluid is compatible physiologic change.  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   Electronically Signed   By: Inge Rise M.D.   On: 10/05/2013 22:19     EKG Interpretation   Date/Time:  Thursday October 05 2013 20:42:43 EDT Ventricular Rate:  115 PR Interval:  137 QRS Duration: 90 QT Interval:  370 QTC Calculation: 512 R Axis:   78 Text Interpretation:  Sinus tachycardia Prolonged QT interval No old  tracing to compare Confirmed by [facility]  MD, TREY ([id number]) on 10/06/2013  1:00:56 PM      1:06 AM I had a lengthy discussion with Dr. Sabra Heck who is on call for [facility] about this patient. We did an extensive chart review as well as discussed findings tonight in the ED. She recommends treating patient's BV and trying Toradol for additional pain control. At this point there does not seem to be anything acutely  life threatening as her abdomen is soft and non surgical and there are currently no signs of systemic infection. Recommend follow up with Dr. Toney Rakes.   MDM   Final  diagnoses:  Abdominal pain  Pelvic pain  Bacterial vaginosis   Patient presents with abdominal pain after appendectomy, salpingo-oopherectomy, and colonoscopy. Patient had this exploratory surgery for her chronic pain. CT is shows no explanation for patient's pain. Labs are unremarkable. Pelvic exam shows no bleeding. Wet prep does show BV for which patient will be treated with Flagyl. During the course of the ED stay patient had several episodes of "syncope". Please see Dr. Omer Jack note for elaboration on this. Patient's abdomen remains soft throughout ED stay and was reexamined prior to discharge. Patient will follow up with PCP and Dr. Toney Rakes. Return instructions given. Vital signs stable for discharge. Patient / Family / Caregiver informed of clinical course, understand medical decision-making process, and agree with plan.      Elwyn Lade, PA-C 10/06/13 1450

## 2013-10-05 NOTE — ED Notes (Signed)
Per EMS pt states was seen at [facility] hospital on Thurs where she had right fallopian tube, ovary and appendix removed. Had colonoscopy at [facility] on Saturday. Pt c/o abdominal and rectal pain for a couple of days but states pain is worse today. Pt also c/o tarry stools.

## 2013-10-05 NOTE — ED Notes (Signed)
Bed: YF11 Expected date:  Expected time:  Means of arrival:  Comments: EMS- abdominal pain, bloody stool

## 2013-10-05 NOTE — ED Notes (Signed)
Patient back from CT dept 

## 2013-10-05 NOTE — ED Notes (Signed)
PA at bedside.

## 2013-10-05 NOTE — ED Notes (Signed)
EKG given to EDP, Wafford, MD., for review. 

## 2013-10-06 ENCOUNTER — Encounter: Payer: Self-pay | Admitting: *Deleted

## 2013-10-06 LAB — GC/CHLAMYDIA PROBE AMP
CT Probe RNA: NEGATIVE
GC Probe RNA: NEGATIVE

## 2013-10-06 LAB — WET PREP, GENITAL
TRICH WET PREP: NONE SEEN
Yeast Wet Prep HPF POC: NONE SEEN

## 2013-10-06 MED ORDER — OXYCODONE-ACETAMINOPHEN 5-325 MG PO TABS: 2.0000 | ORAL_TABLET | Freq: Four times a day (QID) | ORAL | Status: DC | PRN

## 2013-10-06 MED ORDER — HYDROMORPHONE HCL PF 1 MG/ML IJ SOLN
1.0000 mg | Freq: Once | INTRAMUSCULAR | Status: AC
  Administered 2013-10-06: 1 mg via INTRAVENOUS
  Filled 2013-10-06: qty 1

## 2013-10-06 MED ORDER — ONDANSETRON HCL 4 MG PO TABS: 4.0000 mg | ORAL_TABLET | Freq: Four times a day (QID) | ORAL | Status: DC

## 2013-10-06 MED ORDER — KETOROLAC TROMETHAMINE 30 MG/ML IJ SOLN
30.0000 mg | Freq: Once | INTRAMUSCULAR | Status: AC
  Administered 2013-10-06: 30 mg via INTRAVENOUS
  Filled 2013-10-06: qty 1

## 2013-10-06 MED ORDER — METRONIDAZOLE 500 MG PO TABS: 500.0000 mg | ORAL_TABLET | Freq: Two times a day (BID) | ORAL | Status: DC

## 2013-10-06 NOTE — ED Provider Notes (Signed)
Medical screening examination/treatment/procedure(s) were conducted as a shared visit with non-physician practitioner(s) and myself.  I personally evaluated the patient during the encounter.   EKG Interpretation   Date/Time:  Thursday October 05 2013 20:42:43 EDT Ventricular Rate:  115 PR Interval:  137 QRS Duration: 90 QT Interval:  370 QTC Calculation: 512 R Axis:   78 Text Interpretation:  Sinus tachycardia Prolonged QT interval No old  tracing to compare Confirmed by [facility]  MD, TREY ([id number]) on 10/06/2013  1:00:56 PM      38 yo female presenting with exacerbation of chronic pelvic pain.  Had exploratory laparoscopy about a week ago with removal of appendix and ovary.  Pain has continued.  On exam, intermittently screaming in pain, abdomen soft, tender in suprapubic region without r/r/g.  Lungs clear and heart sounds normal. Plan for CT to rule out post surgical complications.  I was called to her room to evaluate for syncope.  This occurred a few times during ED course.  I witnessed a few episodes from beginning to end.  She began to yell in pain, hyperventilate, and then slump in bed.  These episodes seemed to garner much attention for the patient but on each time I witnessed episode, she immediately opened her eyes and regarded me when I applied sternal rub.  She could have had a vasovagal type syncope, but my suspicion is that she did not truly lose consciousness.  On one such episode, there was concern that she may have gone into V tach.  I reviewed the strip personally and reviewed it (and EKG) with Dr. Claiborne Billings (on call Cardiologist).  This strip was not consistent with V tach.  Her EKG showed mild QTc prolongation, which I do not think is representative of true QTc syndrome.  Pt was noted to be very histrionic during her ED course and prior notes reveal concern for narcotic seeking behavior.     Ultimately, she appeared appropriate for continued outpatient management of her chronic pain.     Clinical Impression: 1. Abdominal pain   2. Pelvic pain   3. Bacterial vaginosis       [name], MD 10/06/13 1308

## 2013-10-06 NOTE — Discharge Instructions (Signed)
Please call Dr. Toney Rakes tomorrow morning to schedule a visit as soon as possible.   Abdominal Pain, Women Abdominal (stomach, pelvic, or belly) pain can be caused by many things. It is important to tell your doctor:  The location of the pain.  Does it come and go or is it present all the time?  Are there things that start the pain (eating certain foods, exercise)?  Are there other symptoms associated with the pain (fever, nausea, vomiting, diarrhea)? All of this is helpful to know when trying to find the cause of the pain. CAUSES   Stomach: virus or bacteria infection, or ulcer.  Intestine: appendicitis (inflamed appendix), regional ileitis (Crohn's disease), ulcerative colitis (inflamed colon), irritable bowel syndrome, diverticulitis (inflamed diverticulum of the colon), or cancer of the stomach or intestine.  Gallbladder disease or stones in the gallbladder.  Kidney disease, kidney stones, or infection.  Pancreas infection or cancer.  Fibromyalgia (pain disorder).  Diseases of the female organs:  Uterus: fibroid (non-cancerous) tumors or infection.  Fallopian tubes: infection or tubal pregnancy.  Ovary: cysts or tumors.  Pelvic adhesions (scar tissue).  Endometriosis (uterus lining tissue growing in the pelvis and on the pelvic organs).  Pelvic congestion syndrome (female organs filling up with blood just before the menstrual period).  Pain with the menstrual period.  Pain with ovulation (producing an egg).  Pain with an IUD (intrauterine device, birth control) in the uterus.  Cancer of the female organs.  Functional pain (pain not caused by a disease, may improve without treatment).  Psychological pain.  Depression. DIAGNOSIS  Your doctor will decide the seriousness of your pain by doing an examination.  Blood tests.  X-rays.  Ultrasound.  CT scan (computed tomography, special type of X-ray).  MRI (magnetic resonance imaging).  Cultures, for  infection.  Barium enema (dye inserted in the large intestine, to better view it with X-rays).  Colonoscopy (looking in intestine with a lighted tube).  Laparoscopy (minor surgery, looking in abdomen with a lighted tube).  Major abdominal exploratory surgery (looking in abdomen with a large incision). TREATMENT  The treatment will depend on the cause of the pain.   Many cases can be observed and treated at home.  Over-the-counter medicines recommended by your caregiver.  Prescription medicine.  Antibiotics, for infection.  Birth control pills, for painful periods or for ovulation pain.  Hormone treatment, for endometriosis.  Nerve blocking injections.  Physical therapy.  Antidepressants.  Counseling with a psychologist or psychiatrist.  Minor or major surgery. HOME CARE INSTRUCTIONS   Do not take laxatives, unless directed by your caregiver.  Take over-the-counter pain medicine only if ordered by your caregiver. Do not take aspirin because it can cause an upset stomach or bleeding.  Try a clear liquid diet (broth or water) as ordered by your caregiver. Slowly move to a bland diet, as tolerated, if the pain is related to the stomach or intestine.  Have a thermometer and take your temperature several times a day, and record it.  Bed rest and sleep, if it helps the pain.  Avoid sexual intercourse, if it causes pain.  Avoid stressful situations.  Keep your follow-up appointments and tests, as your caregiver orders.  If the pain does not go away with medicine or surgery, you may try:  Acupuncture.  Relaxation exercises (yoga, meditation).  Group therapy.  Counseling. SEEK MEDICAL CARE IF:   You notice certain foods cause stomach pain.  Your home care treatment is not helping your pain.  You need stronger pain medicine.  You want your IUD removed.  You feel faint or lightheaded.  You develop nausea and vomiting.  You develop a rash.  You are  having side effects or an allergy to your medicine. SEEK IMMEDIATE MEDICAL CARE IF:   Your pain does not go away or gets worse.  You have a fever.  Your pain is felt only in portions of the abdomen. The right side could possibly be appendicitis. The left lower portion of the abdomen could be colitis or diverticulitis.  You are passing blood in your stools (bright red or black tarry stools, with or without vomiting).  You have blood in your urine.  You develop chills, with or without a fever.  You pass out. MAKE SURE YOU:   Understand these instructions.  Will watch your condition.  Will get help right away if you are not doing well or get worse. Document Released: 05/03/2007 Document Revised: 09/28/2011 Document Reviewed: 05/23/2009 [facility] Patient Information 2014 [place], [place].  Pelvic Pain Pelvic pain is pain felt below the belly button and between your hips. It can be caused by many different things. It is important to get help right away. This is especially true for severe, sharp, or unusual pain that comes on suddenly.  HOME CARE  Only take medicine as told by your doctor.  Rest as told by your doctor.  Eat a healthy diet, such as fruits, vegetables, and lean meats.  Drink enough fluids to keep your pee (urine) clear or pale yellow, or as told.  Avoid sex (intercourse) if it causes pain.  Apply warm or cold packs to your lower belly (abdomen). Use the type of pack that helps the pain.  Avoid situations that cause you stress.  Keep a journal to track your pain. Write down:  When the pain started.  Where it is located.  If there are things that seem to be related to the pain, such as food or your period.  Follow up with your doctor as told. GET HELP RIGHT AWAY IF:   You have heavy bleeding from the vagina.  You have more pelvic pain.  You feel lightheaded or pass out (faint).  You have chills.  You have pain when you pee or have blood in your  pee.  You cannot stop having watery poop (diarrhea).  You cannot stop throwing up (vomiting).  You have a fever or lasting symptoms for more than 3 days.  You have a fever and your symptoms suddenly get worse.  You are being physically or sexually abused.  Your medicine does not help your pain.  You have fluid (discharge) coming from your vagina that is not normal. MAKE SURE YOU:  Understand these instructions.  Will watch your condition.  Will get help if you are not doing well or get worse. Document Released: 12/23/2007 Document Revised: 01/05/2012 Document Reviewed: 10/26/2011 [facility] Patient Information 2014 [place], [place].

## 2013-10-07 NOTE — ED Provider Notes (Signed)
Medical screening examination/treatment/procedure(s) were conducted as a shared visit with non-physician practitioner(s) and myself.  I personally evaluated the patient during the encounter.   EKG Interpretation   Date/Time:  Thursday October 05 2013 20:42:43 EDT Ventricular Rate:  115 PR Interval:  137 QRS Duration: 90 QT Interval:  370 QTC Calculation: 512 R Axis:   78 Text Interpretation:  Sinus tachycardia Prolonged QT interval No old  tracing to compare Confirmed by [facility]  MD, TREY ([id number]) on 10/06/2013  1:00:56 PM        [name], MD 10/07/13 [id number]

## 2013-10-09 ENCOUNTER — Telehealth: Payer: Self-pay | Admitting: *Deleted

## 2013-10-09 NOTE — Telephone Encounter (Signed)
Left message for pt to call regarding the below note.
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
## 2013-10-11 ENCOUNTER — Encounter (HOSPITAL_COMMUNITY): Payer: Self-pay | Admitting: Emergency Medicine

## 2013-10-11 ENCOUNTER — Emergency Department (HOSPITAL_COMMUNITY)
Admission: EM | Admit: 2013-10-11 | Discharge: 2013-10-12 | Disposition: A | Discharge status: Home / Self Care | Attending: Emergency Medicine | Admitting: Emergency Medicine

## 2013-10-11 DIAGNOSIS — Z8719 Personal history of other diseases of the digestive system: Secondary | ICD-10-CM | POA: Insufficient documentation

## 2013-10-11 DIAGNOSIS — F329 Major depressive disorder, single episode, unspecified: Secondary | ICD-10-CM | POA: Insufficient documentation

## 2013-10-11 DIAGNOSIS — F3289 Other specified depressive episodes: Secondary | ICD-10-CM | POA: Insufficient documentation

## 2013-10-11 DIAGNOSIS — Z8669 Personal history of other diseases of the nervous system and sense organs: Secondary | ICD-10-CM | POA: Insufficient documentation

## 2013-10-11 DIAGNOSIS — Z862 Personal history of diseases of the blood and blood-forming organs and certain disorders involving the immune mechanism: Secondary | ICD-10-CM | POA: Insufficient documentation

## 2013-10-11 DIAGNOSIS — S3210XA Unspecified fracture of sacrum, initial encounter for closed fracture: Secondary | ICD-10-CM | POA: Insufficient documentation

## 2013-10-11 DIAGNOSIS — Z79899 Other long term (current) drug therapy: Secondary | ICD-10-CM | POA: Insufficient documentation

## 2013-10-11 DIAGNOSIS — Z880 Allergy status to penicillin: Secondary | ICD-10-CM | POA: Insufficient documentation

## 2013-10-11 DIAGNOSIS — F411 Generalized anxiety disorder: Secondary | ICD-10-CM | POA: Insufficient documentation

## 2013-10-11 DIAGNOSIS — S322XXA Fracture of coccyx, initial encounter for closed fracture: Secondary | ICD-10-CM

## 2013-10-11 DIAGNOSIS — Z8742 Personal history of other diseases of the female genital tract: Secondary | ICD-10-CM | POA: Insufficient documentation
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
## 2013-10-12 ENCOUNTER — Emergency Department (HOSPITAL_COMMUNITY)

## 2013-10-12 MED ORDER — HYDROMORPHONE HCL PF 1 MG/ML IJ SOLN
1.0000 mg | Freq: Once | INTRAMUSCULAR | Status: AC
  Administered 2013-10-12: 1 mg via INTRAMUSCULAR
  Filled 2013-10-12: qty 1

## 2013-10-12 MED ORDER — OXYCODONE-ACETAMINOPHEN 5-325 MG PO TABS: 1.0000 | ORAL_TABLET | Freq: Four times a day (QID) | ORAL | Status: DC | PRN

## 2013-10-12 MED ORDER — ONDANSETRON 4 MG PO TBDP
4.0000 mg | ORAL_TABLET | Freq: Once | ORAL | Status: AC
  Administered 2013-10-12: 4 mg via ORAL
  Filled 2013-10-12: qty 1

## 2013-10-12 MED ORDER — HYDROMORPHONE HCL PF 2 MG/ML IJ SOLN
2.0000 mg | Freq: Once | INTRAMUSCULAR | Status: AC
  Administered 2013-10-12: 2 mg via INTRAMUSCULAR
  Filled 2013-10-12: qty 1
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
## 2013-10-12 NOTE — ED Notes (Signed)
Pt reports the pain medication brought her pain down to a 5/10 but now pt has a return on pain 7/10.

## 2013-10-16 ENCOUNTER — Encounter (HOSPITAL_COMMUNITY): Payer: Self-pay | Admitting: Emergency Medicine

## 2013-10-16 ENCOUNTER — Emergency Department (HOSPITAL_COMMUNITY)
Admission: EM | Admit: 2013-10-16 | Discharge: 2013-10-16 | Disposition: A | Discharge status: Home / Self Care | Attending: Emergency Medicine | Admitting: Emergency Medicine

## 2013-10-16 ENCOUNTER — Emergency Department (HOSPITAL_COMMUNITY)

## 2013-10-16 DIAGNOSIS — Z880 Allergy status to penicillin: Secondary | ICD-10-CM | POA: Insufficient documentation

## 2013-10-16 DIAGNOSIS — M7918 Myalgia, other site: Secondary | ICD-10-CM

## 2013-10-16 DIAGNOSIS — S322XXA Fracture of coccyx, initial encounter for closed fracture: Secondary | ICD-10-CM

## 2013-10-16 DIAGNOSIS — Z8742 Personal history of other diseases of the female genital tract: Secondary | ICD-10-CM | POA: Insufficient documentation

## 2013-10-16 DIAGNOSIS — Z862 Personal history of diseases of the blood and blood-forming organs and certain disorders involving the immune mechanism: Secondary | ICD-10-CM | POA: Insufficient documentation

## 2013-10-16 DIAGNOSIS — K5900 Constipation, unspecified: Secondary | ICD-10-CM | POA: Insufficient documentation

## 2013-10-16 DIAGNOSIS — IMO0002 Reserved for concepts with insufficient information to code with codable children: Secondary | ICD-10-CM

## 2013-10-16 DIAGNOSIS — Z8719 Personal history of other diseases of the digestive system: Secondary | ICD-10-CM | POA: Insufficient documentation

## 2013-10-16 DIAGNOSIS — S3210XA Unspecified fracture of sacrum, initial encounter for closed fracture: Secondary | ICD-10-CM | POA: Insufficient documentation

## 2013-10-16 DIAGNOSIS — R209 Unspecified disturbances of skin sensation: Secondary | ICD-10-CM | POA: Insufficient documentation

## 2013-10-16 DIAGNOSIS — Z8669 Personal history of other diseases of the nervous system and sense organs: Secondary | ICD-10-CM | POA: Insufficient documentation

## 2013-10-16 DIAGNOSIS — Z8659 Personal history of other mental and behavioral disorders: Secondary | ICD-10-CM | POA: Insufficient documentation

## 2013-10-16 MED ORDER — KETOROLAC TROMETHAMINE 60 MG/2ML IM SOLN
60.0000 mg | Freq: Once | INTRAMUSCULAR | Status: AC
  Administered 2013-10-16: 60 mg via INTRAMUSCULAR
  Filled 2013-10-16: qty 2

## 2013-10-16 MED ORDER — OXYCODONE-ACETAMINOPHEN 5-325 MG PO TABS
2.0000 | ORAL_TABLET | Freq: Once | ORAL | Status: AC
  Administered 2013-10-16: 2 via ORAL
  Filled 2013-10-16: qty 2

## 2013-10-16 MED ORDER — OXYCODONE-ACETAMINOPHEN 5-325 MG PO TABS: ORAL_TABLET | ORAL | Status: DC

## 2013-10-16 NOTE — ED Provider Notes (Signed)
Medical screening examination/treatment/procedure(s) were performed by non-physician practitioner and as supervising physician I was immediately available for consultation/collaboration.   EKG Interpretation None        Neta Ehlers, MD 10/16/13 2148

## 2013-10-16 NOTE — Discharge Instructions (Signed)
Abuse The following are signs and symptoms that may be seen in someone being abused. If you think someone is being abused, get help from a caregiver or professional near you. You may be the only protection that person has against abuse that they can not stop. PHYSICAL ABUSE is when a person is hit, slapped, beaten, burned, or otherwise physically harmed. Like other forms of abuse, physical abuse usually continues for a long time.  SEXUAL ABUSE is when a person engages in a sexual situation without permission. Sometimes this means direct sexual contact, such as intercourse, other genital contact or touching. But it can also mean that the person is made to watch sexual acts, look at another person's genitals, look at pornography or be part of the production of pornography. Children and the elderly many times are not forced into the sexual situation, but rather they are persuaded, bribed, tricked or coerced.  EMOTIONAL/ MENTAL ABUSE is when a person is regularly threatened, yelled at, humiliated, ignored, blamed or otherwise emotionally mistreated. For example, making fun of a person, calling a person names, and always finding fault are forms of emotional/mental abuse.  SIGNS AND SYMPTOMS OF ABUSE Any injury (bruising, swelling, burns, broken bones) that cannot be explained.  Complaints of abdominal pain.  Agitation, anxiety, fear, hesitation to talk openly.  Wary of adult contact.  Frequent or uncontrolled urination or bowel movements.  Behavior extremes (strange, withdrawn, fearful, overly pleasing).  Afraid to go home; frightened of parents.  Reports injuries by parents.  Torn, stained or bloody underwear.  Pain or itching of the genital area.  Venereal disease, especially in pre-teens.  Pregnancy.  Unusual sexual behavior or knowledge.  Reports sexual assault by caretaker.  Lack of friends; does not want to go to school.  Consistent hunger, poor hygiene, inappropriate  dress.  Lack of supervision; States there is no caretaker.  Constant fatigue or listlessness.  Unattended physical needs or medical problems.  Abandonment.  Decayed or painful teeth.  Begging, stealing food.  Loss of hair.  Alcohol or drug abuse.  Attempted suicide. Spousal  Any injury (bruising, swelling, burns, broken bones) that cannot be explained.  Reports of sexual abuse.  Public and private demeaning actions.  Frightened behavior when with the opposite sex.  Reports of injuries by batterer.  Afraid to go home.  Drastic behavior changes in presence of batterer.  STEPS YOU CAN TAKE  Take your child out of the home if you feel that violence is going to occur. Learn the warning signs of danger. This varies with situations but may include: use of alcohol; weapon threats; threats to your child, yourself and other family members or pets; forced sexual contact; or less frequent apologies.  Tell your doctor or nurse. They are legally obligated to report all suspected cases of abuse to the police.  If you or anyone you know is attacked or beaten, report it to the police so the abuse is documented.  Find someone you can trust and tell them what is happening. It is very important to get a child out of an abusive situation as soon as possible. They cannot protect themselves and are in danger.  It is important to have a safety plan in case you or your child is threatened:  Keep extra clothing for yourself and your children, medicines, money, important phone numbers and papers, and an extra set of car and house keys at a friend's or neighbor's house.  Tell a supportive friend or family member that  you may show up at any time of day or night in an emergency.  If you do not have a close friend or family member, make a list of other safe places to go (shelters, crisis centers, etc.). Keep an abuse hotline number available. They can help you.  Many victims do not leave bad  situations because they do not have money or a job. Planning ahead may help you in the future. Try to save money in a safe place. Keep your job or try to get a job. If you cannot get a job, try to obtain training you may need to prepare you for one. Social services are equipped to help you and your child. Do not stay or leave your child in an abusive situation. The result may be fatal. TELEPHONE NUMBERS TO KEEP CLOSE AT HAND INCLUDE YOUR:   Local Social Services.  Local safe house or shelter.  Local emergency service (911 in the U.[place].). SEEK MEDICAL CARE IF:   Your child has new problems because of injuries.  You feel the level of danger growing that you or your child may be abused. SEEK IMMEDIATE MEDICAL CARE IF:   You are afraid that you or your child, or someone you know may be beaten or abused. Call emergency services (911) for help.  You or your child receives new injuries related to abuse. Document Released: 04/01/2005 Document Revised: 09/28/2011 Document Reviewed: 11/14/2008 [facility] Patient Information 2014 O[place].  Emergency Department Resource Guide 1) Find a Doctor and Pay Out of Pocket Although you won't have to find out who is covered by your insurance plan, it is a good idea to ask around and get recommendations. You will then need to call the office and see if the doctor you have chosen will accept you as a new patient and what types of options they offer for patients who are self-pay. Some doctors offer discounts or will set up payment plans for their patients who do not have insurance, but you will need to ask so you aren't surprised when you get to your appointment.  2) Contact Your Local Health Department Not all health departments have doctors that can see patients for sick visits, but many do, so it is worth a call to see if yours does. If you don't know where your local health department is, you can check in your phone book. The CDC also has a tool to help you  locate your state's health department, and many state websites also have listings of all of their local health departments.  3) Find a [facility] If your illness is not likely to be very severe or complicated, you may want to try a walk in clinic. These are popping up all over the country in pharmacies, drugstores, and shopping centers. They're usually staffed by nurse practitioners or physician assistants that have been trained to treat common illnesses and complaints. They're usually fairly quick and inexpensive. However, if you have serious medical issues or chronic medical problems, these are probably not your best option.  No Primary Care Doctor: - Call Health Connect at  [phone] - they can help you locate a primary care doctor that  accepts your insurance, provides certain services, etc. - Physician Referral Service- [phone]  Chronic Pain Problems: Organization         Address  Phone   Notes  [facility]  [phone] Patients need to be referred by their primary care doctor.   Medication Assistance:  Organization         Address  Phone   Notes  [facility] Medication Assistance Program [address] [place], [place] [address] [phone] --Must be a resident of [facility] -- Must have NO insurance coverage whatsoever (no Medicaid/ Medicare, etc.) -- The pt. MUST have a primary care doctor that directs their care regularly and follows them in the community   MedAssist  [phone]   [employer]  [phone]    Agencies that provide inexpensive medical care: Organization         Address                                                       Phone                                                                            Notes  [place]  [phone]   [facility]    [phone]   [facility] [place] [address], [place] [address] [phone]   [place] [address] [place] [phone]   Planned Parenthood    [phone]   [facility]    [phone]   [place] [facility] [place] [address], [place] Phone:  [phone], Fax:  [phone] Hours of Operation:  9 am - 6 pm, M-F.  Also accepts Medicaid/Medicare and self-pay.  [facility] for [place], [address], [place] Phone: [phone], Fax: [phone]. Hours of Operation:  8:30 am - 5:30 pm, M-F.  Also accepts Medicaid and self-pay.  [facility] [place] [address], [place] Phone: [phone]   [place], [place], [place] [phone], Ext. [phone] Mondays & Thursdays: 7-9 AM.  First 15 patients are seen on a first come, first serve basis.    [place] Providers:  Organization         Address                                                                       Phone                               Notes  [facility] [address], [place] [phone] Also accepts self-pay patients.  [place], [place]  [phone]   [place], [address], [place] [phone]   [facility] [address] [place] [phone]  Lucianne Lei [address], [place]   [phone] Only accepts [place] patients after they have their name applied to their card.   Self-Pay (no insurance) in [facility]:   Organization         Address                                                     Phone               Notes  Sickle Cell Patients,  [facility] [place] [phone]   [facility] Urgent Care [place] [phone]   [facility] [place]  [place], [place],  [phone]   [facility]/Dr. Osei-Bonsu  [address] [place] or [address], [place] [phone] Phone number for both [place] and [place] locations is the same.  [facility] [address] [place] [phone]   [facility] [address] [place] or [address] [phone]   [facility] [address], [place] [phone], phone; [phone], fax Sees patients 1st and 3rd Saturday of every month.  Must not qualify for public or private insurance (i.e. Medicaid, Medicare, [place] Health Choice, Veterans' Benefits)  Household income should be no more than 200% of the poverty level The clinic cannot treat you if you are pregnant or think you are pregnant  Sexually transmitted diseases are not treated at the clinic.    Dental Care: Organization         Address                                  Phone                       Notes  [facility] Department of [facility] [place] [phone] Accepts children up to age 18 who are enrolled in [place] or [address]; pregnant women with a Medicaid card; and children who have applied for Medicaid or [place] Health Choice, but were declined, whose parents can pay a reduced fee at time of service.  [facility] Department of [facility]  [address], [place] [phone] Accepts children up to age 62 who are enrolled in [place] or [place]; pregnant women with a Medicaid card; and children who have applied for Medicaid or [place] Health Choice, but were declined, whose parents can pay a reduced fee at time of service.  [place] Adult Dental Access PROGRAM  [place] [phone] Patients are seen by appointment only. Walk-ins are not accepted. [place] will see patients 36 years of age  and older. Monday - Tuesday (8am-5pm) Most Wednesdays (8:30-5pm) $30 per visit, cash only  [facility] Adult Dental Access PROGRAM  [address], [facility] [phone] Patients are seen by appointment only. Walk-ins are not accepted. [place] will see patients 20 years of age and older. One Wednesday Evening (Monthly: Volunteer Based).  $30 per visit,  cash only  [place]  [phone] for adults; Children under age 67, call Graduate Pediatric Dentistry at [phone]. Children aged 85-14, please call [phone] to request a pediatric application.  Dental services are provided in all areas of dental care including fillings, crowns and bridges, complete and partial dentures, implants, gum treatment, root canals, and extractions. Preventive care is also provided. Treatment is provided to both adults and children. Patients are selected via a lottery and there is often a waiting list.   [facility] [address] [place]  [phone] [email or web address]   [facility] [address] [place], [place] [phone], Ext. [phone] Second and Fourth Thursday of each month, opens at 6:30 AM; Clinic ends at 9 AM.  Patients are seen on a first-come first-served basis, and a limited number are seen during each clinic.   [facility]  [address] [place] Danker [place], [place] [phone]   Eligibility Requirements You must have lived in [place], [place], or [place] counties for at least the last three months.   You cannot be eligible for state or federal sponsored [employer], including [employer], [place], or [employer].   You generally cannot be eligible for healthcare insurance through your employer.    How to apply: Eligibility screenings are held every Tuesday and Wednesday afternoon from 1:00 pm until 4:00 pm. You do not need an appointment for the interview!  [facility] [address] [place], [place]   [place]  [place] Department  [place]  [phone]    Behavioral Health Resources in the Community: Intensive Outpatient Programs Organization         Address                                              Phone              Notes  [facility] [place]. [address] [place], [place] [phone]   [facility] Outpatient [address], [place], [place]   [facility]: [facility] [address] [place], [place]   [place] [address]  [place], [place] or [phone]   Substance Abuse Resources Organization         Address                                Phone  Notes  Alcohol and Drug Services  [phone]   [place]  [phone]   [facility]   Chinita Pester  [phone]   Residential & Outpatient Substance Abuse Program  [phone]   Psychological Services Organization         Address                                  Phone                Notes  [facility] [address]  [place]  [phone]   [place] [address] [place]  [phone] or [phone]    Mobile Crisis Teams Organization         Address  Phone  Notes  [facility], Mobile Crisis Care Unit  [phone]   [facility]  [address] [place], [place]   [facility] [address] [place] [phone]    Self-Help/Support Groups Organization         Address                         Phone             Notes  [place]. of [place] - variety of support groups  [place] Call for more information  Narcotics Anonymous (NA), [facility] [address], [employer] [place]  2 meetings at this location    Special educational needs teacher         Address                                                    Phone              Notes  ASAP Residential Treatment [place],    [place]  [phone]   [facility]  [address], [place] [address], [place], [place]   [place], [place] [phone] Admissions: 8am-3pm M-F  Incentives Substance [place] [address],    [place], [place] [phone]   [facility] [address] [place], [place], [place]   [facility] [address]  [place], [place]   Insight Programs - Intensive Outpatient [address] Kristeen [address], [place], [place]   [facility] ([place]  [place], [place] [phone] or [phone]   [facility] Services (RTS) [address], [place], [place] Accepts Medicaid  [facility] [place] [address],  [place] [phone] Substance Abuse/Addiction Treatment   [facility] Organization         Address                                                            Phone                    Notes  [facility]  [phone]   Domenic Schwab, PhD [address] [place], [place]   [phone] or [phone]   [place], [place] [phone]   [place] [address], [place], [place] [phone] Insurance/Medicaid/sponsorship through [facility] and Families [address]                                    [place], [place] [phone] [place] [address] [place] [phone]    Dr. Adele Schilder  [phone]   [facility]  [place] Dept. 1) [address] [place] 2) [place] 3)  [address],  [place] [phone]  [phone]   [place] [phone] or [phone] (After Hours)

## 2013-10-16 NOTE — ED Notes (Addendum)
Pt calm at this time. Friend is with her and she reports that her pain is better after the Toradol. Pt is refusing the xray.
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
## 2013-10-16 NOTE — ED Notes (Addendum)
Pt asking for pain meds. She was offered Percocet but pt said "nothing helps my pain but dilaudid. Pt starting to get loud and agitated. Pt says that she get's sick from percocet. Pt also reports she takes percocet at home for pain.

## 2013-10-16 NOTE — ED Notes (Signed)
Pt friend asking for pain meds for the patient.
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
## 2013-10-17 ENCOUNTER — Ambulatory Visit: Admitting: Gynecology

## 2013-10-18 ENCOUNTER — Encounter (HOSPITAL_COMMUNITY): Payer: Self-pay | Admitting: Emergency Medicine

## 2013-10-18 ENCOUNTER — Emergency Department (HOSPITAL_COMMUNITY)
Admission: EM | Admit: 2013-10-18 | Discharge: 2013-10-19 | Disposition: A | Discharge status: Home / Self Care | Attending: Emergency Medicine | Admitting: Emergency Medicine

## 2013-10-18 DIAGNOSIS — Y929 Unspecified place or not applicable: Secondary | ICD-10-CM | POA: Insufficient documentation

## 2013-10-18 DIAGNOSIS — W010XXA Fall on same level from slipping, tripping and stumbling without subsequent striking against object, initial encounter: Secondary | ICD-10-CM | POA: Insufficient documentation

## 2013-10-18 DIAGNOSIS — Z862 Personal history of diseases of the blood and blood-forming organs and certain disorders involving the immune mechanism: Secondary | ICD-10-CM | POA: Insufficient documentation

## 2013-10-18 DIAGNOSIS — F411 Generalized anxiety disorder: Secondary | ICD-10-CM | POA: Insufficient documentation

## 2013-10-18 DIAGNOSIS — G8929 Other chronic pain: Secondary | ICD-10-CM | POA: Insufficient documentation

## 2013-10-18 DIAGNOSIS — Y9389 Activity, other specified: Secondary | ICD-10-CM | POA: Insufficient documentation

## 2013-10-18 DIAGNOSIS — Z880 Allergy status to penicillin: Secondary | ICD-10-CM | POA: Insufficient documentation

## 2013-10-18 DIAGNOSIS — IMO0001 Reserved for inherently not codable concepts without codable children: Secondary | ICD-10-CM | POA: Insufficient documentation

## 2013-10-18 DIAGNOSIS — M533 Sacrococcygeal disorders, not elsewhere classified: Secondary | ICD-10-CM

## 2013-10-18 MED ORDER — HYDROCODONE-ACETAMINOPHEN 5-325 MG PO TABS
2.0000 | ORAL_TABLET | Freq: Once | ORAL | Status: AC
  Administered 2013-10-18: 2 via ORAL
  Filled 2013-10-18: qty 2

## 2013-10-18 MED ORDER — KETOROLAC TROMETHAMINE 60 MG/2ML IM SOLN
60.0000 mg | Freq: Once | INTRAMUSCULAR | Status: AC
  Administered 2013-10-18: 60 mg via INTRAMUSCULAR
  Filled 2013-10-18: qty 2
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
## 2013-10-18 NOTE — Discharge Instructions (Signed)
1. Medications: tylenol or your percocet at home, usual home medications 2. Treatment: rest, drink plenty of fluids,  3. Follow Up: Please followup with your primary doctor for discussion of your diagnoses and further evaluation after today's visit; if you do not have a primary care doctor use the resource guide provided to find one;     Tailbone Injury The tailbone (coccyx) is the small bone at the lower end of the spine. A tailbone injury may involve stretched ligaments, bruising, or a broken bone (fracture). Women are more vulnerable to this injury due to having a wider pelvis. CAUSES  This type of injury typically occurs from falling and landing on the tailbone. Repeated strain or friction from actions such as rowing and bicycling may also injure the area. The tailbone can be injured during childbirth. Infections or tumors may also press on the tailbone and cause pain. Sometimes, the cause of injury is unknown. SYMPTOMS   Bruising.  Pain when sitting.  Painful bowel movements.  In women, pain during intercourse. DIAGNOSIS  Your caregiver can diagnose a tailbone injury based on your symptoms and a physical exam. X-rays may be taken if a fracture is suspected. Your caregiver may also use an MRI scan imaging test to evaluate your symptoms. TREATMENT  Your caregiver may prescribe medicines to help relieve your pain. Most tailbone injuries heal on their own in 4 to 6 weeks. However, if the injury is caused by an infection or tumor, the recovery period may vary. PREVENTION  Wear appropriate padding and sports gear when bicycling and rowing. This can help prevent an injury from repeated strain or friction. HOME CARE INSTRUCTIONS   Put ice on the injured area.  Put ice in a plastic bag.  Place a towel between your skin and the bag.  Leave the ice on for 15-20 minutes, every hour while awake for the first 1 to 2 days.  Sit on a large, rubber or inflated ring or cushion to ease your pain.  Lean forward when sitting to help decrease discomfort.  Avoid sitting for long periods of time.  Increase your activity as the pain allows.  Only take over-the-counter or prescription medicines for pain, discomfort, or fever as directed by your caregiver.  You may use stool softeners if it is painful to have a bowel movement, or as directed by your caregiver.  Eat a diet with plenty of fiber to help prevent constipation.  Keep all follow-up appointments as directed by your caregiver. SEEK MEDICAL CARE IF:   Your pain becomes worse.  Your bowel movements cause a great deal of discomfort.  You are unable to have a bowel movement.  You have a fever. MAKE SURE YOU:  Understand these instructions.  Will watch your condition.  Will get help right away if you are not doing well or get worse. Document Released: 07/03/2000 Document Revised: 09/28/2011 Document Reviewed: 01/29/2011 [facility] Patient Information 2014 [place], [place].
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
## 2013-10-21 NOTE — ED Provider Notes (Signed)
Medical screening examination/treatment/procedure(s) were performed by non-physician practitioner and as supervising physician I was immediately available for consultation/collaboration.   EKG Interpretation None        Orpah Greek, MD 10/21/13 [phone]

## 2013-10-26 ENCOUNTER — Encounter (HOSPITAL_COMMUNITY): Payer: Self-pay | Admitting: Emergency Medicine

## 2013-10-26 DIAGNOSIS — Y929 Unspecified place or not applicable: Secondary | ICD-10-CM | POA: Insufficient documentation

## 2013-10-26 DIAGNOSIS — R112 Nausea with vomiting, unspecified: Secondary | ICD-10-CM | POA: Insufficient documentation

## 2013-10-26 DIAGNOSIS — Z862 Personal history of diseases of the blood and blood-forming organs and certain disorders involving the immune mechanism: Secondary | ICD-10-CM | POA: Insufficient documentation

## 2013-10-26 DIAGNOSIS — Z8659 Personal history of other mental and behavioral disorders: Secondary | ICD-10-CM | POA: Insufficient documentation

## 2013-10-26 DIAGNOSIS — R296 Repeated falls: Secondary | ICD-10-CM | POA: Insufficient documentation

## 2013-10-26 DIAGNOSIS — Y9389 Activity, other specified: Secondary | ICD-10-CM | POA: Insufficient documentation

## 2013-10-26 DIAGNOSIS — S322XXA Fracture of coccyx, initial encounter for closed fracture: Principal | ICD-10-CM

## 2013-10-26 DIAGNOSIS — E876 Hypokalemia: Secondary | ICD-10-CM | POA: Insufficient documentation

## 2013-10-26 DIAGNOSIS — R197 Diarrhea, unspecified: Secondary | ICD-10-CM | POA: Insufficient documentation

## 2013-10-26 DIAGNOSIS — Z79899 Other long term (current) drug therapy: Secondary | ICD-10-CM | POA: Insufficient documentation

## 2013-10-26 DIAGNOSIS — K921 Melena: Secondary | ICD-10-CM | POA: Insufficient documentation

## 2013-10-26 DIAGNOSIS — S3210XA Unspecified fracture of sacrum, initial encounter for closed fracture: Secondary | ICD-10-CM | POA: Insufficient documentation

## 2013-10-26 DIAGNOSIS — Z880 Allergy status to penicillin: Secondary | ICD-10-CM | POA: Insufficient documentation

## 2013-10-26 DIAGNOSIS — R609 Edema, unspecified: Secondary | ICD-10-CM | POA: Insufficient documentation

## 2013-10-26 DIAGNOSIS — Z8742 Personal history of other diseases of the female genital tract: Secondary | ICD-10-CM | POA: Insufficient documentation

## 2013-10-26 DIAGNOSIS — Z8669 Personal history of other diseases of the nervous system and sense organs: Secondary | ICD-10-CM | POA: Insufficient documentation

## 2013-10-26 LAB — CBC WITH DIFFERENTIAL/PLATELET
BASOS ABS: 0 10*3/uL (ref 0.0–0.1)
Basophils Relative: 1 % (ref 0–1)
EOS ABS: 0 10*3/uL (ref 0.0–0.7)
EOS PCT: 1 % (ref 0–5)
HCT: 30.4 % — ABNORMAL LOW (ref 36.0–46.0)
Hemoglobin: 10.2 g/dL — ABNORMAL LOW (ref 12.0–15.0)
Lymphocytes Relative: 31 % (ref 12–46)
Lymphs Abs: 0.8 10*3/uL (ref 0.7–4.0)
MCH: 31.4 pg (ref 26.0–34.0)
MCHC: 33.6 g/dL (ref 30.0–36.0)
MCV: 93.5 fL (ref 78.0–100.0)
Monocytes Absolute: 0.3 10*3/uL (ref 0.1–1.0)
Monocytes Relative: 10 % (ref 3–12)
Neutro Abs: 1.5 10*3/uL — ABNORMAL LOW (ref 1.7–7.7)
Neutrophils Relative %: 57 % (ref 43–77)
Platelets: 178 10*3/uL (ref 150–400)
RBC: 3.25 MIL/uL — ABNORMAL LOW (ref 3.87–5.11)
RDW: 17.7 % — AB (ref 11.5–15.5)
WBC: 2.6 10*3/uL — AB (ref 4.0–10.5)

## 2013-10-26 MED ORDER — ONDANSETRON 4 MG PO TBDP
8.0000 mg | ORAL_TABLET | Freq: Once | ORAL | Status: AC
  Administered 2013-10-26: 8 mg via ORAL
  Filled 2013-10-26: qty 2

## 2013-10-26 NOTE — ED Notes (Signed)
Pt states that she fell 2.5 weeks ago and broke her tailbone.  She fell again tonight and reinjured her tailbone.  She states she has also been having abd pain and diarrhea

## 2013-10-26 NOTE — ED Notes (Signed)
Pt not given triage protocol pain due to allergy interactions

## 2013-10-26 NOTE — ED Notes (Signed)
Pt unable to void at this time. 

## 2013-10-26 NOTE — ED Notes (Signed)
Pt sitting in wheelchair saying that she just wants to go home and that she is in pain and would rather be in pain.  Triage and rooming process explained to pt, and friend help convince pt to stay at this time.

## 2013-10-27 ENCOUNTER — Emergency Department (HOSPITAL_COMMUNITY)
Admission: EM | Admit: 2013-10-27 | Discharge: 2013-10-27 | Disposition: A | Discharge status: Home / Self Care | Attending: Emergency Medicine | Admitting: Emergency Medicine

## 2013-10-27 DIAGNOSIS — S322XXA Fracture of coccyx, initial encounter for closed fracture: Secondary | ICD-10-CM

## 2013-10-27 DIAGNOSIS — E876 Hypokalemia: Secondary | ICD-10-CM

## 2013-10-27 DIAGNOSIS — R197 Diarrhea, unspecified: Secondary | ICD-10-CM

## 2013-10-27 DIAGNOSIS — R1110 Vomiting, unspecified: Secondary | ICD-10-CM

## 2013-10-27 LAB — COMPREHENSIVE METABOLIC PANEL
ALT: 16 U/L (ref 0–35)
AST: 57 U/L — AB (ref 0–37)
Albumin: 3.5 g/dL (ref 3.5–5.2)
Alkaline Phosphatase: 84 U/L (ref 39–117)
BUN: 5 mg/dL — ABNORMAL LOW (ref 6–23)
CALCIUM: 8.3 mg/dL — AB (ref 8.4–10.5)
CO2: 27 mEq/L (ref 19–32)
CREATININE: 0.53 mg/dL (ref 0.50–1.10)
Chloride: 101 mEq/L (ref 96–112)
GFR calc Af Amer: >90 mL/min (ref >90)
GFR calc non Af Amer: >90 mL/min (ref >90)
Glucose, Bld: 106 mg/dL — ABNORMAL HIGH (ref 70–99)
Potassium: 2.9 mEq/L — CL (ref 3.7–5.3)
Sodium: 144 mEq/L (ref 137–147)
Total Bilirubin: 0.3 mg/dL (ref 0.3–1.2)
Total Protein: 6.2 g/dL (ref 6.0–8.3)

## 2013-10-27 LAB — POC OCCULT BLOOD, ED: Fecal Occult Bld: NEGATIVE

## 2013-10-27 MED ORDER — SODIUM CHLORIDE 0.9 % IV BOLUS (SEPSIS)
1000.0000 mL | Freq: Once | INTRAVENOUS | Status: AC
  Administered 2013-10-27: 1000 mL via INTRAVENOUS

## 2013-10-27 MED ORDER — ONDANSETRON HCL 4 MG PO TABS: 4.0000 mg | ORAL_TABLET | Freq: Four times a day (QID) | ORAL | Status: DC

## 2013-10-27 MED ORDER — FENTANYL CITRATE 0.05 MG/ML IJ SOLN
50.0000 ug | Freq: Once | INTRAMUSCULAR | Status: AC
  Administered 2013-10-27: 50 ug via INTRAVENOUS
  Filled 2013-10-27: qty 2

## 2013-10-27 MED ORDER — POTASSIUM CHLORIDE 10 MEQ/100ML IV SOLN
10.0000 meq | INTRAVENOUS | Status: AC
  Administered 2013-10-27 (×3): 10 meq via INTRAVENOUS
  Filled 2013-10-27 (×4): qty 100

## 2013-10-27 MED ORDER — HYDROCODONE-ACETAMINOPHEN 5-325 MG PO TABS
1.0000 | ORAL_TABLET | Freq: Once | ORAL | Status: AC
  Administered 2013-10-27: 1 via ORAL
  Filled 2013-10-27: qty 1

## 2013-10-27 MED ORDER — POTASSIUM CHLORIDE CRYS ER 20 MEQ PO TBCR
20.0000 meq | EXTENDED_RELEASE_TABLET | Freq: Once | ORAL | Status: AC
  Administered 2013-10-27: 20 meq via ORAL
  Filled 2013-10-27: qty 1

## 2013-10-27 MED ORDER — HYDROCODONE-ACETAMINOPHEN 5-325 MG PO TABS: 1.0000 | ORAL_TABLET | Freq: Four times a day (QID) | ORAL | Status: DC | PRN

## 2013-10-27 NOTE — Discharge Instructions (Signed)
Your potassium was extremely low on arrival.  Its been supplemented with IV and by mouth, you been given an outline for potassium rich in to help supplement this.  He also been given diet.  Guidelines for diarrhea and a prescription for Vicodin for your toxic fracture.  Pain at anytime, you develop new or worsening symptoms.  Please return to the emergency department for further evaluation.  As far as the swelling, and, discomfort.  You're having in your face, and ankles.  This will resolve over a period of time as the potassium takes a while to shift from your blood system into your tissue and surrounding cells

## 2013-10-27 NOTE — ED Provider Notes (Signed)
CSN: [id number]     Arrival date & time 10/26/13  2238 History   First MD Initiated Contact with Patient 10/27/13 0113     Chief Complaint  Patient presents with  . Fall     (Consider location/radiation/quality/duration/timing/severity/associated sxs/prior Treatment) HPI Comments: Patient reports, that she's had nausea, vomiting, and diarrhea for the past 4, days.  She's been unable to tolerate any by mouth tabs.  She has a recent history of coccyx fracture, for, which she's been taking ibuprofen on a regular basis.  She also reports, that she has seen blood in stool, and is having abdominal cramping at this time.  She also states, that she has developed hemorrhoids due to the  frequent movements She was prescribed, Percocet, but she found this to be too strong, and only took one dose.  Patient is a 38 y.o. female presenting with fall. The history is provided by the patient.  Fall This is a new problem. The current episode started in the past 7 days. The problem occurs intermittently. The problem has been unchanged. Associated symptoms include abdominal pain, nausea, vomiting and weakness. Pertinent negatives include no chest pain, coughing or fever. Associated symptoms comments: diarrhea. The symptoms are aggravated by eating. She has tried nothing for the symptoms.    Past Medical History  Diagnosis Date  . Proctitis   . Cysts of both ovaries   . Seizures   . Anemia   . Anxiety   . Blood transfusion without reported diagnosis   . Depression   . Fatty liver 10/05/13   Past Surgical History  Procedure Laterality Date  . Ovarian cyst removal    . Laparoscopy N/A 09/28/2013    Procedure: LAPAROSCOPY OPERATIVE;  Surgeon: Terrance Mass, MD;  Location: [facility];  Service: Gynecology;  Laterality: N/A;  . Laparoscopic appendectomy Right 09/28/2013    Procedure: APPENDECTOMY LAPAROSCOPIC;  Surgeon: Terrance Mass, MD;  Location: [facility];  Service: Gynecology;  Laterality: Right;  .  Salpingoophorectomy Right 09/28/2013    Procedure: SALPINGO OOPHORECTOMY;  Surgeon: Terrance Mass, MD;  Location: [facility];  Service: Gynecology;  Laterality: Right;  . Colonoscopy N/A 09/30/2013    Procedure: COLONOSCOPY;  Surgeon: Lafayette Dragon, MD;  Location: [facility];  Service: Endoscopy;  Laterality: N/A;   Family History  Problem Relation Age of Onset  . Diabetes Mother   . Hyperlipidemia Mother   . Stroke Mother   . Diabetes Father    History  Substance Use Topics  . Smoking status: Never Smoker   . Smokeless tobacco: Not on file  . Alcohol Use: Yes   OB History   Grav Para Term Preterm Abortions TAB SAB Ect Mult Living   7 3   4  4   3      Review of Systems  Constitutional: Negative for fever.  Respiratory: Negative for cough and shortness of breath.   Cardiovascular: Positive for leg swelling. Negative for chest pain.  Gastrointestinal: Positive for nausea, vomiting, abdominal pain, diarrhea and blood in stool.  Genitourinary: Negative for dysuria and frequency.  Neurological: Positive for weakness.  All other systems reviewed and are negative.     Allergies  Morphine and related; Tramadol; and Penicillins  Home Medications   Current Outpatient Rx  Name  Route  Sig  Dispense  Refill  . ibuprofen (ADVIL,MOTRIN) 200 MG tablet   Oral   Take 200 mg by mouth every 6 (six) hours as needed for moderate pain.         Marland Kitchen  HYDROcodone-acetaminophen (NORCO/VICODIN) 5-325 MG per tablet   Oral   Take 1 tablet by mouth every 6 (six) hours as needed for moderate pain.   30 tablet   0   . ondansetron (ZOFRAN) 4 MG tablet   Oral   Take 1 tablet (4 mg total) by mouth every 6 (six) hours.   12 tablet   0    BP 101/59  Pulse 77  Temp(Src) 98 F (36.7 C) (Oral)  Resp 18  SpO2 100%  LMP 09/25/2013 Physical Exam  Nursing note and vitals reviewed. Constitutional: She is oriented to person, place, and time. She appears well-developed and well-nourished.  HENT:   Head: Normocephalic.  Eyes: Pupils are equal, round, and reactive to light.  Neck: Normal range of motion.  Cardiovascular: Normal rate and regular rhythm.   Pulmonary/Chest: Effort normal and breath sounds normal.  Abdominal: Soft. She exhibits no distension. There is no tenderness.  Musculoskeletal: She exhibits edema. She exhibits no tenderness.  Slight edema medial ankles  Neurological: She is alert and oriented to person, place, and time.  Skin: Skin is warm. No pallor.    ED Course  Procedures (including critical care time) Labs Review Labs Reviewed  CBC WITH DIFFERENTIAL - Abnormal; Notable for the following:    WBC 2.6 (*)    RBC 3.25 (*)    Hemoglobin 10.2 (*)    HCT 30.4 (*)    RDW 17.7 (*)    Neutro Abs 1.5 (*)    All other components within normal limits  COMPREHENSIVE METABOLIC PANEL - Abnormal; Notable for the following:    Potassium 2.9 (*)    Glucose, Bld 106 (*)    BUN 5 (*)    Calcium 8.3 (*)    AST 57 (*)    All other components within normal limits  URINALYSIS, ROUTINE W REFLEX MICROSCOPIC  PREGNANCY, URINE  POC OCCULT BLOOD, ED   Imaging Review No results found.   EKG Interpretation None      MDM  Laboratories reviewed potassium of 2.9.  We'll hydrate patient and supplement potassium with K. riders we'll provide pain control.  In the form of fentanyl and reassess Patient has been getting fentanyl without difficulty.  We will try by mouth Vicodin prior to her discharge. Patient reports, that she refuses to have her last IV, potassium supplement.  She will to be supplemented with 20 mEq of potassium prior to discharge Final diagnoses:  Hypokalemia  Diarrhea  Vomiting  Fracture of coccyx         Garald Balding, NP 10/27/13 [phone]

## 2013-10-27 NOTE — ED Notes (Signed)
Pt reports that about a week she has not been feeling like herself, just overall weakness. Baker Janus, NP at bedside.

## 2013-10-27 NOTE — ED Notes (Signed)
Pt ambulated around POD A with slow, steady gait.  A&OX4, no complaints at this time.

## 2013-10-27 NOTE — ED Provider Notes (Signed)
Medical screening examination/treatment/procedure(s) were performed by non-physician practitioner and as supervising physician I was immediately available for consultation/collaboration.    Kalman Drape, MD 10/27/13 [phone]

## 2013-10-27 NOTE — ED Notes (Signed)
This RN informed patient a urine sample is needed but pt reports she is unable to void at this time. Will re-assess shortly.

## 2013-10-27 NOTE — ED Notes (Signed)
Pt reporting her face and feet are hurting. Dr. Sharol Given made aware.

## 2013-10-28 ENCOUNTER — Encounter (HOSPITAL_COMMUNITY): Payer: Self-pay | Admitting: Emergency Medicine

## 2013-10-28 ENCOUNTER — Emergency Department (HOSPITAL_COMMUNITY)

## 2013-10-28 ENCOUNTER — Observation Stay (HOSPITAL_COMMUNITY)
Admission: EM | Admit: 2013-10-28 | Discharge: 2013-10-29 | Disposition: A | Discharge status: Home / Self Care | Payer: Self-pay | Attending: Internal Medicine | Admitting: Internal Medicine

## 2013-10-28 DIAGNOSIS — K921 Melena: Secondary | ICD-10-CM | POA: Diagnosis present

## 2013-10-28 DIAGNOSIS — K760 Fatty (change of) liver, not elsewhere classified: Secondary | ICD-10-CM

## 2013-10-28 DIAGNOSIS — K648 Other hemorrhoids: Secondary | ICD-10-CM

## 2013-10-28 DIAGNOSIS — G40909 Epilepsy, unspecified, not intractable, without status epilepticus: Secondary | ICD-10-CM | POA: Insufficient documentation

## 2013-10-28 DIAGNOSIS — N949 Unspecified condition associated with female genital organs and menstrual cycle: Secondary | ICD-10-CM | POA: Insufficient documentation

## 2013-10-28 DIAGNOSIS — F419 Anxiety disorder, unspecified: Secondary | ICD-10-CM | POA: Insufficient documentation

## 2013-10-28 DIAGNOSIS — F411 Generalized anxiety disorder: Secondary | ICD-10-CM | POA: Insufficient documentation

## 2013-10-28 DIAGNOSIS — K922 Gastrointestinal hemorrhage, unspecified: Secondary | ICD-10-CM

## 2013-10-28 DIAGNOSIS — R102 Pelvic and perineal pain: Secondary | ICD-10-CM | POA: Diagnosis present

## 2013-10-28 DIAGNOSIS — Z9079 Acquired absence of other genital organ(s): Secondary | ICD-10-CM | POA: Insufficient documentation

## 2013-10-28 DIAGNOSIS — G8929 Other chronic pain: Secondary | ICD-10-CM | POA: Insufficient documentation

## 2013-10-28 DIAGNOSIS — D649 Anemia, unspecified: Secondary | ICD-10-CM | POA: Diagnosis present

## 2013-10-28 DIAGNOSIS — F3289 Other specified depressive episodes: Secondary | ICD-10-CM | POA: Insufficient documentation

## 2013-10-28 DIAGNOSIS — K6289 Other specified diseases of anus and rectum: Secondary | ICD-10-CM | POA: Insufficient documentation

## 2013-10-28 DIAGNOSIS — Z8742 Personal history of other diseases of the female genital tract: Secondary | ICD-10-CM

## 2013-10-28 DIAGNOSIS — F329 Major depressive disorder, single episode, unspecified: Secondary | ICD-10-CM | POA: Insufficient documentation

## 2013-10-28 DIAGNOSIS — K7689 Other specified diseases of liver: Secondary | ICD-10-CM | POA: Insufficient documentation

## 2013-10-28 DIAGNOSIS — K625 Hemorrhage of anus and rectum: Principal | ICD-10-CM | POA: Insufficient documentation

## 2013-10-28 LAB — COMPREHENSIVE METABOLIC PANEL
ALBUMIN: 3.1 g/dL — AB (ref 3.5–5.2)
ALT: 19 U/L (ref 0–35)
AST: 76 U/L — ABNORMAL HIGH (ref 0–37)
Alkaline Phosphatase: 80 U/L (ref 39–117)
BILIRUBIN TOTAL: 0.5 mg/dL (ref 0.3–1.2)
BUN: 5 mg/dL — AB (ref 6–23)
CHLORIDE: 100 meq/L (ref 96–112)
CO2: 26 mEq/L (ref 19–32)
CREATININE: 0.44 mg/dL — AB (ref 0.50–1.10)
Calcium: 8 mg/dL — ABNORMAL LOW (ref 8.4–10.5)
GFR calc Af Amer: >90 mL/min (ref >90)
GLUCOSE: 115 mg/dL — AB (ref 70–99)
Potassium: 3.1 mEq/L — ABNORMAL LOW (ref 3.7–5.3)
Sodium: 141 mEq/L (ref 137–147)
Total Protein: 5.7 g/dL — ABNORMAL LOW (ref 6.0–8.3)

## 2013-10-28 LAB — SEDIMENTATION RATE: Sed Rate: 2 mm/hr (ref 0–22)

## 2013-10-28 LAB — CBC
HEMATOCRIT: 27.5 % — AB (ref 36.0–46.0)
HEMOGLOBIN: 9.2 g/dL — AB (ref 12.0–15.0)
MCH: 31.3 pg (ref 26.0–34.0)
MCHC: 33.5 g/dL (ref 30.0–36.0)
MCV: 93.5 fL (ref 78.0–100.0)
Platelets: 166 10*3/uL (ref 150–400)
RBC: 2.94 MIL/uL — ABNORMAL LOW (ref 3.87–5.11)
RDW: 17.3 % — AB (ref 11.5–15.5)
WBC: 2.4 10*3/uL — ABNORMAL LOW (ref 4.0–10.5)

## 2013-10-28 LAB — POC URINE PREG, ED: Preg Test, Ur: NEGATIVE

## 2013-10-28 LAB — POC OCCULT BLOOD, ED: FECAL OCCULT BLD: NEGATIVE

## 2013-10-28 MED ORDER — IOHEXOL 300 MG/ML  SOLN
80.0000 mL | Freq: Once | INTRAMUSCULAR | Status: AC | PRN
  Administered 2013-10-28: 80 mL via INTRAVENOUS

## 2013-10-28 MED ORDER — HYDROMORPHONE HCL PF 1 MG/ML IJ SOLN
1.0000 mg | INTRAMUSCULAR | Status: DC
  Filled 2013-10-28: qty 1

## 2013-10-28 MED ORDER — SODIUM CHLORIDE 0.9 % IV BOLUS (SEPSIS)
500.0000 mL | Freq: Once | INTRAVENOUS | Status: AC
  Administered 2013-10-28: 500 mL via INTRAVENOUS

## 2013-10-28 MED ORDER — FENTANYL CITRATE 0.05 MG/ML IJ SOLN
100.0000 ug | Freq: Once | INTRAMUSCULAR | Status: AC
  Administered 2013-10-28: 100 ug via INTRAVENOUS
  Filled 2013-10-28: qty 2

## 2013-10-28 MED ORDER — ONDANSETRON HCL 4 MG/2ML IJ SOLN
4.0000 mg | Freq: Once | INTRAMUSCULAR | Status: DC
  Filled 2013-10-28: qty 2

## 2013-10-28 MED ORDER — HYDROMORPHONE HCL PF 1 MG/ML IJ SOLN
1.0000 mg | INTRAMUSCULAR | Status: AC
  Administered 2013-10-28: 1 mg via INTRAVENOUS
  Filled 2013-10-28: qty 1

## 2013-10-28 MED ORDER — ONDANSETRON HCL 4 MG/2ML IJ SOLN
4.0000 mg | Freq: Once | INTRAMUSCULAR | Status: AC
  Administered 2013-10-28: 4 mg via INTRAVENOUS
  Filled 2013-10-28: qty 2

## 2013-10-28 MED ORDER — IOHEXOL 300 MG/ML  SOLN
20.0000 mL | INTRAMUSCULAR | Status: AC
  Administered 2013-10-28: 25 mL via ORAL

## 2013-10-28 MED ORDER — ONDANSETRON HCL 4 MG/2ML IJ SOLN
4.0000 mg | INTRAMUSCULAR | Status: AC
  Administered 2013-10-28: 4 mg via INTRAVENOUS

## 2013-10-28 MED ORDER — HYDROMORPHONE HCL PF 1 MG/ML IJ SOLN
1.0000 mg | Freq: Once | INTRAMUSCULAR | Status: AC
  Administered 2013-10-28: 1 mg via INTRAVENOUS
  Filled 2013-10-28: qty 1

## 2013-10-28 NOTE — ED Notes (Signed)
Cpt tech has called requesting a  preg test before she is scanned

## 2013-10-28 NOTE — ED Provider Notes (Signed)
CSN: [id number]     Arrival date & time 10/28/13  1645 History   First MD Initiated Contact with Patient 10/28/13 1701     Chief Complaint  Patient presents with  . Leg Swelling  . Facial Swelling  . Rectal Bleeding      HPI Pt c/o swelling to B/l legs and rectal bleeding onset today. Pt reports feeling dizziness. Denies recent injury. Pt reports rectal bleeding bright red in color. Pt with dry heaves. Pt reports that she fell today and having pain to low back pain. Pt does not want visitor to know.  Past Medical History  Diagnosis Date  . Proctitis   . Cysts of both ovaries   . Seizures   . Anemia   . Anxiety   . Blood transfusion without reported diagnosis   . Depression   . Fatty liver 10/05/13   Past Surgical History  Procedure Laterality Date  . Ovarian cyst removal    . Laparoscopy N/A 09/28/2013    Procedure: LAPAROSCOPY OPERATIVE;  Surgeon: Terrance Mass, MD;  Location: [facility];  Service: Gynecology;  Laterality: N/A;  . Laparoscopic appendectomy Right 09/28/2013    Procedure: APPENDECTOMY LAPAROSCOPIC;  Surgeon: Terrance Mass, MD;  Location: [facility];  Service: Gynecology;  Laterality: Right;  . Salpingoophorectomy Right 09/28/2013    Procedure: SALPINGO OOPHORECTOMY;  Surgeon: Terrance Mass, MD;  Location: [facility];  Service: Gynecology;  Laterality: Right;  . Colonoscopy N/A 09/30/2013    Procedure: COLONOSCOPY;  Surgeon: Lafayette Dragon, MD;  Location: [facility];  Service: Endoscopy;  Laterality: N/A;   Family History  Problem Relation Age of Onset  . Diabetes Mother   . Hyperlipidemia Mother   . Stroke Mother   . Diabetes Father    History  Substance Use Topics  . Smoking status: Never Smoker   . Smokeless tobacco: Not on file  . Alcohol Use: Yes   OB History   Grav Para Term Preterm Abortions TAB SAB Ect Mult Living   7 3   4  4   3      Review of Systems   All other systems reviewed and are negative   Allergies  Morphine and related;  Tramadol; and Penicillins  Home Medications   Current Outpatient Rx  Name  Route  Sig  Dispense  Refill  . HYDROcodone-acetaminophen (NORCO/VICODIN) 5-325 MG per tablet   Oral   Take 1 tablet by mouth every 6 (six) hours as needed for moderate pain.   30 tablet   0   . ibuprofen (ADVIL,MOTRIN) 200 MG tablet   Oral   Take 200 mg by mouth every 6 (six) hours as needed for moderate pain.         Marland Kitchen ondansetron (ZOFRAN) 4 MG tablet   Oral   Take 1 tablet (4 mg total) by mouth every 6 (six) hours.   12 tablet   0    BP 116/78  Pulse 95  Temp(Src) 98.4 F (36.9 C) (Oral)  Resp 18  Ht 5\' 6"  (1.676 m)  SpO2 99%  LMP 09/25/2013 Physical Exam  Nursing note and vitals reviewed. Constitutional: She is oriented to person, place, and time. She appears well-developed and well-nourished. No distress.  HENT:  Head: Normocephalic and atraumatic.  Eyes: Pupils are equal, round, and reactive to light.  Neck: Normal range of motion.  Cardiovascular: Normal rate and intact distal pulses.   Pulmonary/Chest: No respiratory distress.  Abdominal: Normal appearance. She exhibits no distension.  There is generalized tenderness. There is no rebound, no guarding, no tenderness at McBurney's point and negative Murphy's sign.  Musculoskeletal: Normal range of motion. She exhibits edema.  Neurological: She is alert and oriented to person, place, and time. No cranial nerve deficit.  Skin: Skin is warm and dry. No rash noted.  Psychiatric: She has a normal mood and affect. Her behavior is normal.    ED Course  Procedures (including critical care time)  Medications  ondansetron (ZOFRAN) injection 4 mg (4 mg Intravenous Not Given 10/28/13 1733)  iohexol (OMNIPAQUE) 300 MG/ML solution 20 mL (25 mLs Oral Contrast Given 10/28/13 1854)  HYDROmorphone (DILAUDID) injection 1 mg (1 mg Intravenous Not Given 10/28/13 2156)  sodium chloride 0.9 % bolus 500 mL (0 mLs Intravenous Stopped 10/28/13 1842)  fentaNYL  (SUBLIMAZE) injection 100 mcg (100 mcg Intravenous Given 10/28/13 1759)  HYDROmorphone (DILAUDID) injection 1 mg (1 mg Intravenous Given 10/28/13 1831)  ondansetron (ZOFRAN) injection 4 mg (4 mg Intravenous Given 10/28/13 1841)  iohexol (OMNIPAQUE) 300 MG/ML solution 80 mL (80 mLs Intravenous Contrast Given 10/28/13 2123)  ondansetron (ZOFRAN) injection 4 mg (4 mg Intravenous Given 10/28/13 2156)  HYDROmorphone (DILAUDID) injection 1 mg (1 mg Intravenous Given 10/28/13 2209)    Labs Review Labs Reviewed  CBC - Abnormal; Notable for the following:    WBC 2.4 (*)    RBC 2.94 (*)    Hemoglobin 9.2 (*)    HCT 27.5 (*)    RDW 17.3 (*)    All other components within normal limits  COMPREHENSIVE METABOLIC PANEL - Abnormal; Notable for the following:    Potassium 3.1 (*)    Glucose, Bld 115 (*)    BUN 5 (*)    Creatinine, Ser 0.44 (*)    Calcium 8.0 (*)    Total Protein 5.7 (*)    Albumin 3.1 (*)    AST 76 (*)    All other components within normal limits  SEDIMENTATION RATE  C-REACTIVE PROTEIN  VITAMIN B12  FOLATE  IRON AND TIBC  FERRITIN  RETICULOCYTES  POC OCCULT BLOOD, ED  POC OCCULT BLOOD, ED  POC URINE PREG, ED    Discussed with gastroenterology, Dr. Deatra Ina, who will see the patient in consult.  We'll repeat CT abdomen/pelvis and draw inflammatory markers. Imaging Review Ct Abdomen Pelvis W Contrast  10/28/2013   CLINICAL DATA:  Lower abdominal pain. Swelling in the legs. Nausea, vomiting, diarrhea. Previous appendectomy and right ovary removed 1 month ago.  EXAM: CT ABDOMEN AND PELVIS WITH CONTRAST  TECHNIQUE: Multidetector CT imaging of the abdomen and pelvis was performed using the standard protocol following bolus administration of intravenous contrast.  CONTRAST:  38mL OMNIPAQUE IOHEXOL 300 MG/ML  SOLN  COMPARISON:  10/05/2013.  09/17/2013.  FINDINGS: Motion artifact limits visualization of the lung bases. No focal infiltration identified.  Diffuse fatty infiltration of the  liver. No focal lesions. The gallbladder, pancreas, spleen, adrenal glands, kidneys, abdominal aorta, inferior vena cava, and retroperitoneal lymph nodes are unremarkable. The stomach and small bowel are decompressed. Colon is decompressed. No free air or free fluid in the abdomen.  Pelvis: Small amount of free fluid in the right pelvis is likely physiologic or possibly postoperative. Uterus is anteverted without enlargement. No abnormal adnexal masses. The appendix is surgically absent by history with surgical clips present in the right lower quadrant. The colon is decompressed and therefore cannot be effectively evaluated for wall thickening. No extra colonic infiltration or abscess. Bladder wall is not thickened.  No significant pelvic lymphadenopathy. Spondylolysis with mild spondylolisthesis at L5-S1. No destructive bone lesions appreciated.  IMPRESSION: No acute process demonstrated in the abdomen or pelvis. Small amount of free fluid in the pelvis is likely physiologic. Diffuse fatty infiltration of the liver   Electronically Signed   By: Lucienne Capers M.D.   On: 10/28/2013 21:56      MDM   Final diagnoses:  Anemia  GI bleeding        Dot Lanes, MD 10/28/13 2241

## 2013-10-28 NOTE — ED Notes (Signed)
Pt pain assessed.  She feels weak.  She has finished her oral contrast..  c-t called and informed about  that

## 2013-10-28 NOTE — ED Notes (Signed)
The pt is c/o a burning sensation in the bottom of both feet.  She is uncomfortable in general

## 2013-10-28 NOTE — ED Notes (Addendum)
Pt c/o swelling to B/l legs and rectal bleeding onset today. Pt reports feeling dizziness. Denies recent injury. Pt reports rectal bleeding bright red in color. Pt with dry heaves. Pt reports that she fell today and having pain to low back pain. Pt does not want visitor to know.

## 2013-10-28 NOTE — ED Notes (Signed)
c-t contacted and told that her oref testr was neg

## 2013-10-28 NOTE — ED Notes (Signed)
Patient transported to CT 

## 2013-10-28 NOTE — ED Notes (Signed)
Pt states she fell today, reports coccyx and back pain at the time. Also states rectal bleeding and generalized weakness. States she also feels very dizzy at present. 7/10 pain at the time. Pt is alert and oriented x4.

## 2013-10-28 NOTE — H&P (Signed)
Chief Complaint:  Rectal bleeding  HPI: 38 yo female h/o chronic pelvic pain with recent oophorectomy (due to cysts) and appy which has relieved much of her pelvic pain, h/o proctitis over a year ago at [place] for which she needed a blood transfusion, comes in with a large bm that was bloody today.  She has had several days of n/v nonbloody.  No fevers.  Today when she went to [facility] had diarrhea and it was bloody.  She has had several episodes of rectal bleeding over the last year, and had GI consult as outpt with colonoscopy about a month ago which was relatively normal, biopsies were taken then (i cannot find the path reports for these in epic).   Review of Systems:  Positive and negative as per HPI otherwise all other systems are negative  Past Medical History: Past Medical History  Diagnosis Date  . Proctitis   . Cysts of both ovaries   . Seizures   . Anemia   . Anxiety   . Blood transfusion without reported diagnosis   . Depression   . Fatty liver 10/05/13   Past Surgical History  Procedure Laterality Date  . Ovarian cyst removal    . Laparoscopy N/A 09/28/2013    Procedure: LAPAROSCOPY OPERATIVE;  Surgeon: Terrance Mass, MD;  Location: [facility];  Service: Gynecology;  Laterality: N/A;  . Laparoscopic appendectomy Right 09/28/2013    Procedure: APPENDECTOMY LAPAROSCOPIC;  Surgeon: Terrance Mass, MD;  Location: [facility];  Service: Gynecology;  Laterality: Right;  . Salpingoophorectomy Right 09/28/2013    Procedure: SALPINGO OOPHORECTOMY;  Surgeon: Terrance Mass, MD;  Location: [facility];  Service: Gynecology;  Laterality: Right;  . Colonoscopy N/A 09/30/2013    Procedure: COLONOSCOPY;  Surgeon: Lafayette Dragon, MD;  Location: [facility];  Service: Endoscopy;  Laterality: N/A;    Medications: Prior to Admission medications   Medication Sig Start Date End Date Taking? Authorizing Provider  HYDROcodone-acetaminophen (NORCO/VICODIN) 5-325 MG per tablet Take 1 tablet by mouth  every 6 (six) hours as needed for moderate pain. 10/27/13  Yes Garald Balding, NP  ibuprofen (ADVIL,MOTRIN) 200 MG tablet Take 200 mg by mouth every 6 (six) hours as needed for moderate pain.   Yes Historical Provider, MD  ondansetron (ZOFRAN) 4 MG tablet Take 1 tablet (4 mg total) by mouth every 6 (six) hours. 10/27/13   Garald Balding, NP    Allergies:   Allergies  Allergen Reactions  . Morphine And Related Anaphylaxis        . Tramadol Other (See Comments)    Seizures   . Penicillins Other (See Comments)    Unknown childhood reaction.    Social History:  reports that she has never smoked. She does not have any smokeless tobacco history on file. She reports that she drinks alcohol. She reports that she does not use illicit drugs.  Family History: Family History  Problem Relation Age of Onset  . Diabetes Mother   . Hyperlipidemia Mother   . Stroke Mother   . Diabetes Father     Physical Exam: Filed Vitals:   10/28/13 1815 10/28/13 1909 10/28/13 1914 10/28/13 2250  BP: 104/85 116/78 116/78 125/78  Pulse: 82 62 95 78  Temp:    98.1 F (36.7 C)  TempSrc:      Resp: 18 18  20   Height:      SpO2: 99% 97% 99% 98%   General appearance: alert, cooperative and no distress Head: Normocephalic,  without obvious abnormality, atraumatic Eyes: negative Nose: Nares normal. Septum midline. Mucosa normal. No drainage or sinus tenderness. Neck: no JVD and supple, symmetrical, trachea midline Lungs: clear to auscultation bilaterally Heart: regular rate and rhythm, S1, S2 normal, no murmur, click, rub or gallop Abdomen: soft, non-tender; bowel sounds normal; no masses,  no organomegaly Extremities: extremities normal, atraumatic, no cyanosis or edema Pulses: 2+ and symmetric Skin: Skin color, texture, turgor normal. No rashes or lesions Neurologic: Grossly normal  Rectal by EDP reported no blood, heme neg mild hemorrhoids   Labs on Admission:   Recent Labs  10/26/13 2255  10/28/13 1657  NA 144 141  K 2.9* 3.1*  CL 101 100  CO2 27 26  GLUCOSE 106* 115*  BUN 5* 5*  CREATININE 0.53 0.44*  CALCIUM 8.3* 8.0*    Recent Labs  10/26/13 2255 10/28/13 1657  AST 57* 76*  ALT 16 19  ALKPHOS 84 80  BILITOT 0.3 0.5  PROT 6.2 5.7*  ALBUMIN 3.5 3.1*    Recent Labs  10/26/13 2255 10/28/13 1657  WBC 2.6* 2.4*  NEUTROABS 1.5*  --   HGB 10.2* 9.2*  HCT 30.4* 27.5*  MCV 93.5 93.5  PLT 178 166    Radiological Exams on Admission:  Ct Abdomen Pelvis W Contrast  10/28/2013   CLINICAL DATA:  Lower abdominal pain. Swelling in the legs. Nausea, vomiting, diarrhea. Previous appendectomy and right ovary removed 1 month ago.  EXAM: CT ABDOMEN AND PELVIS WITH CONTRAST  TECHNIQUE: Multidetector CT imaging of the abdomen and pelvis was performed using the standard protocol following bolus administration of intravenous contrast.  CONTRAST:  43mL OMNIPAQUE IOHEXOL 300 MG/ML  SOLN  COMPARISON:  10/05/2013.  09/17/2013.  FINDINGS: Motion artifact limits visualization of the lung bases. No focal infiltration identified.  Diffuse fatty infiltration of the liver. No focal lesions. The gallbladder, pancreas, spleen, adrenal glands, kidneys, abdominal aorta, inferior vena cava, and retroperitoneal lymph nodes are unremarkable. The stomach and small bowel are decompressed. Colon is decompressed. No free air or free fluid in the abdomen.  Pelvis: Small amount of free fluid in the right pelvis is likely physiologic or possibly postoperative. Uterus is anteverted without enlargement. No abnormal adnexal masses. The appendix is surgically absent by history with surgical clips present in the right lower quadrant. The colon is decompressed and therefore cannot be effectively evaluated for wall thickening. No extra colonic infiltration or abscess. Bladder wall is not thickened. No significant pelvic lymphadenopathy. Spondylolysis with mild spondylolisthesis at L5-S1. No destructive bone lesions  appreciated.  IMPRESSION: No acute process demonstrated in the abdomen or pelvis. Small amount of free fluid in the pelvis is likely physiologic. Diffuse fatty infiltration of the liver   Electronically Signed   By: Lucienne Capers M.D.   On: 10/28/2013 21:56    Assessment/Plan  38 yo female with report of hematochezia with normocytic anemia  Principal Problem:   Hematochezia-  GI called and will see in am.  No bleeding now.  Will keep npo in case need scoping in am.  Ck anemia panel.  ivf overnight.  obs on medical.  Active Problems:   Female pelvic pain   History of ovarian cyst   Proctitis   Normocytic anemia    Jorrell Kuster A Jordyn Doane 10/28/2013, 11:24 PM

## 2013-10-28 NOTE — ED Notes (Signed)
The pts preg test is neg c-t called results.  The pts pain is increasing

## 2013-10-29 DIAGNOSIS — F411 Generalized anxiety disorder: Secondary | ICD-10-CM

## 2013-10-29 DIAGNOSIS — K760 Fatty (change of) liver, not elsewhere classified: Secondary | ICD-10-CM

## 2013-10-29 DIAGNOSIS — K922 Gastrointestinal hemorrhage, unspecified: Secondary | ICD-10-CM

## 2013-10-29 DIAGNOSIS — K648 Other hemorrhoids: Secondary | ICD-10-CM

## 2013-10-29 DIAGNOSIS — K921 Melena: Secondary | ICD-10-CM

## 2013-10-29 DIAGNOSIS — K7689 Other specified diseases of liver: Secondary | ICD-10-CM

## 2013-10-29 LAB — BASIC METABOLIC PANEL
BUN: 3 mg/dL — ABNORMAL LOW (ref 6–23)
CHLORIDE: 101 meq/L (ref 96–112)
CO2: 26 meq/L (ref 19–32)
Calcium: 7.5 mg/dL — ABNORMAL LOW (ref 8.4–10.5)
Creatinine, Ser: 0.42 mg/dL — ABNORMAL LOW (ref 0.50–1.10)
GFR calc non Af Amer: >90 mL/min (ref >90)
Glucose, Bld: 96 mg/dL (ref 70–99)
Potassium: 3.2 mEq/L — ABNORMAL LOW (ref 3.7–5.3)
Sodium: 141 mEq/L (ref 137–147)

## 2013-10-29 LAB — FOLATE: Folate: 1 ng/mL — ABNORMAL LOW

## 2013-10-29 LAB — URINALYSIS, ROUTINE W REFLEX MICROSCOPIC
Bilirubin Urine: NEGATIVE
Glucose, UA: NEGATIVE mg/dL
Ketones, ur: NEGATIVE mg/dL
Leukocytes, UA: NEGATIVE
NITRITE: NEGATIVE
PH: 7.5 (ref 5.0–8.0)
Protein, ur: NEGATIVE mg/dL
Specific Gravity, Urine: 1.017 (ref 1.005–1.030)
Urobilinogen, UA: 0.2 mg/dL (ref 0.0–1.0)

## 2013-10-29 LAB — URINE MICROSCOPIC-ADD ON

## 2013-10-29 LAB — FERRITIN: Ferritin: 155 ng/mL (ref 10–291)

## 2013-10-29 LAB — IRON AND TIBC: Iron: 162 ug/dL — ABNORMAL HIGH (ref 42–135)

## 2013-10-29 LAB — CBC
HCT: 27.2 % — ABNORMAL LOW (ref 36.0–46.0)
Hemoglobin: 9.3 g/dL — ABNORMAL LOW (ref 12.0–15.0)
MCH: 31.5 pg (ref 26.0–34.0)
MCHC: 34.2 g/dL (ref 30.0–36.0)
MCV: 92.2 fL (ref 78.0–100.0)
PLATELETS: 137 10*3/uL — AB (ref 150–400)
RBC: 2.95 MIL/uL — ABNORMAL LOW (ref 3.87–5.11)
RDW: 17.3 % — AB (ref 11.5–15.5)
WBC: 1.6 10*3/uL — AB (ref 4.0–10.5)

## 2013-10-29 LAB — RETICULOCYTES
RBC.: 2.89 MIL/uL — ABNORMAL LOW (ref 3.87–5.11)
RETIC CT PCT: 3.2 % — AB (ref 0.4–3.1)
Retic Count, Absolute: 92.5 10*3/uL (ref 19.0–186.0)

## 2013-10-29 LAB — VITAMIN B12: Vitamin B-12: 338 pg/mL (ref 211–911)

## 2013-10-29 LAB — C-REACTIVE PROTEIN: CRP: <0.5 mg/dL — ABNORMAL LOW (ref <0.60)

## 2013-10-29 MED ORDER — POTASSIUM CHLORIDE CRYS ER 20 MEQ PO TBCR
40.0000 meq | EXTENDED_RELEASE_TABLET | Freq: Two times a day (BID) | ORAL | Status: DC
  Administered 2013-10-29: 40 meq via ORAL
  Filled 2013-10-29: qty 2

## 2013-10-29 MED ORDER — MAGNESIUM SULFATE 40 MG/ML IJ SOLN
2.0000 g | Freq: Once | INTRAMUSCULAR | Status: AC
  Administered 2013-10-29: 2 g via INTRAVENOUS
  Filled 2013-10-29: qty 50

## 2013-10-29 MED ORDER — POTASSIUM CHLORIDE CRYS ER 20 MEQ PO TBCR
40.0000 meq | EXTENDED_RELEASE_TABLET | Freq: Once | ORAL | Status: AC
  Administered 2013-10-29: 40 meq via ORAL
  Filled 2013-10-29: qty 2

## 2013-10-29 MED ORDER — KETOROLAC TROMETHAMINE 30 MG/ML IJ SOLN
30.0000 mg | Freq: Three times a day (TID) | INTRAMUSCULAR | Status: DC | PRN
  Administered 2013-10-29: 30 mg via INTRAVENOUS
  Filled 2013-10-29: qty 1

## 2013-10-29 MED ORDER — HYDROCODONE-ACETAMINOPHEN 5-325 MG PO TABS
1.0000 | ORAL_TABLET | Freq: Four times a day (QID) | ORAL | Status: DC | PRN
  Administered 2013-10-29: 1 via ORAL
  Filled 2013-10-29: qty 1

## 2013-10-29 MED ORDER — ONDANSETRON HCL 4 MG/2ML IJ SOLN: 4.0000 mg | Freq: Three times a day (TID) | INTRAMUSCULAR | Status: AC | PRN

## 2013-10-29 MED ORDER — ACETAMINOPHEN 650 MG RE SUPP: 650.0000 mg | Freq: Four times a day (QID) | RECTAL | Status: DC | PRN

## 2013-10-29 MED ORDER — ALUM & MAG HYDROXIDE-SIMETH 200-200-20 MG/5ML PO SUSP: 30.0000 mL | Freq: Four times a day (QID) | ORAL | Status: DC | PRN

## 2013-10-29 MED ORDER — ONDANSETRON HCL 4 MG/2ML IJ SOLN
4.0000 mg | Freq: Four times a day (QID) | INTRAMUSCULAR | Status: DC | PRN
  Administered 2013-10-29: 4 mg via INTRAVENOUS
  Filled 2013-10-29: qty 2

## 2013-10-29 MED ORDER — HYDROMORPHONE HCL PF 1 MG/ML IJ SOLN
1.0000 mg | INTRAMUSCULAR | Status: AC | PRN
  Administered 2013-10-29 (×3): 1 mg via INTRAVENOUS
  Filled 2013-10-29 (×3): qty 1

## 2013-10-29 MED ORDER — POTASSIUM CHLORIDE IN NACL 40-0.9 MEQ/L-% IV SOLN
INTRAVENOUS | Status: AC
  Administered 2013-10-29: 01:00:00 via INTRAVENOUS
  Filled 2013-10-29: qty 1000

## 2013-10-29 MED ORDER — ONDANSETRON HCL 4 MG PO TABS
4.0000 mg | ORAL_TABLET | Freq: Four times a day (QID) | ORAL | Status: DC | PRN
  Administered 2013-10-29: 4 mg via ORAL
  Filled 2013-10-29: qty 1

## 2013-10-29 MED ORDER — ACETAMINOPHEN 325 MG PO TABS: 650.0000 mg | ORAL_TABLET | Freq: Four times a day (QID) | ORAL | Status: DC | PRN

## 2013-10-29 NOTE — Progress Notes (Signed)
Patient discharged home in stable condition. Verbalizes understanding of all discharge instructions, including home medications and follow up appointments. 

## 2013-10-29 NOTE — Consult Note (Signed)
[facility] Consult: 8:18 AM 10/29/2013  LOS: 1 day    Referring Provider: Derrill Kay MD  Primary Care Physician:  Primary Gastroenterologist:  Dr. [name]   Reason for Consultation:  Bleeding per rectum   HPI: Kristen Roberson is a 38 y.o. female.  38 year old gravida 7 para 3 AB 4 (2 spontaneous AB and 2 elective terminations).  Chronic pelvic pain attributed to ovarian cysts for which she underwent laparoscopic right salpingooophorectomy and appendectomy on 09/28/13.  Had recurrent pain post op and minor rectal bleeding preop.  Therefore the day after discharge outpt colonoscopy was  arranged with Dr Olevia Perches.    Colonoscopy on 09/30/13 for low volume hematochezia showed " Normal colon; multiple biopsies were performed of normal appearing rectal mucosa" . Nothing to account for pre-op abdominal pain, the procedure was painful because of recent laparoscopic surgery 48 hours ago, I did not attempt to intubate terminal ileum because of recent appendectomy and associated discomfort with insufflation and risk of perforation. There was no active proctitis - biopsies pending"   Pathology showed benign rectal mucosa. She had previous hx of proctitis.   Yesterday she had a loose bowel movement.  This was followed by painless rectal bleeding in the absence of bowel movement staining her underclothes.  She denies rectal or abdominal pain.   Admitted yesterday with   CT scan, her third in 6 weeks, shows physiologic free pelvic fluid and fatty liver.  K 3.1, c/w 2.9 on 4/9 (hypokalemia dates to earliest labs of 09/17/13).  No elevated renal function. AST elevated at 76, otherwise LFTs normal.  Hgb is 9.3 with MCV 92. Baseline is 11.  Many WBCs on urine. Rare bacteria, no nitrites or leukocytes.   Med list includes  Vicodin, Ibuprofen.    Past Medical History  Diagnosis Date  . Proctitis   . Cysts of both ovaries   . Seizures   . Anemia   . Anxiety   . Blood transfusion without reported diagnosis   . Depression   . Fatty liver 10/05/13    Past Surgical History  Procedure Laterality Date  . Ovarian cyst removal    . Laparoscopy N/A 09/28/2013    Procedure: LAPAROSCOPY OPERATIVE;  Surgeon: Terrance Mass, MD;  Location: [facility];  Service: Gynecology;  Laterality: N/A;  . Laparoscopic appendectomy Right 09/28/2013    Procedure: APPENDECTOMY LAPAROSCOPIC;  Surgeon: Terrance Mass, MD;  Location: [facility];  Service: Gynecology;  Laterality: Right;  . Salpingoophorectomy Right 09/28/2013    Procedure: SALPINGO OOPHORECTOMY;  Surgeon: Terrance Mass, MD;  Location: [facility];  Service: Gynecology;  Laterality: Right;  . Colonoscopy N/A 09/30/2013    Procedure: COLONOSCOPY;  Surgeon: Lafayette Dragon, MD;  Location: [facility];  Service: Endoscopy;  Laterality: N/A;    Prior to Admission medications   Medication Sig Start Date End Date Taking? Authorizing Provider  HYDROcodone-acetaminophen (NORCO/VICODIN) 5-325 MG per tablet Take 1 tablet by mouth every 6 (six) hours as needed for moderate pain. 10/27/13  Yes Garald Balding, NP  ibuprofen (  ADVIL,MOTRIN) 200 MG tablet Take 200 mg by mouth every 6 (six) hours as needed for moderate pain.   Yes Historical Provider, MD  ondansetron (ZOFRAN) 4 MG tablet Take 1 tablet (4 mg total) by mouth every 6 (six) hours. 10/27/13   Garald Balding, NP    Scheduled Meds: . potassium chloride  40 mEq Oral BID   Infusions: . 0.9 % NaCl with KCl 40 mEq / L 75 mL/hr at 10/29/13 0127   PRN Meds: acetaminophen, acetaminophen, alum & mag hydroxide-simeth, HYDROmorphone (DILAUDID) injection, ketorolac, ondansetron (ZOFRAN) IV, ondansetron (ZOFRAN) IV, ondansetron   Allergies as of 10/28/2013 - Review Complete 10/28/2013  Allergen Reaction Noted  . Morphine and related  Anaphylaxis 09/17/2013  . Tramadol Other (See Comments) 09/17/2013  . Penicillins Other (See Comments) 09/17/2013    Family History  Problem Relation Age of Onset  . Diabetes Mother   . Hyperlipidemia Mother   . Stroke Mother   . Diabetes Father     History   Social History  . Marital Status: Legally Separated    Spouse Name: N/A    Number of Children: N/A  . Years of Education: N/A   Occupational History  . Not on file.   Social History Main Topics  . Smoking status: Never Smoker   . Smokeless tobacco: Not on file  . Alcohol Use: Yes  . Drug Use: No  . Sexual Activity: Yes   Other Topics Concern  . Not on file   Social History Narrative  . No narrative on file    REVIEW OF SYSTEMS: Constitutional:  Nom weight loss ENT:  No nose bleeds Pulm:  No sob or cough CV:  No palpitations, no LE edema.  GU:  No hematuria, no frequency GI:  Per HPI Heme:  *No bleeding diathesis**    Neuro:  No headaches, no peripheral tingling or numbness Derm:  No itching, no rash or sores.  Endocrine:  No sweats or chills.  No polyuria or dysuria Immunization:   Travel:  None beyond local counties in last few months.    PHYSICAL EXAM: Vital signs in last 24 hours: Filed Vitals:   10/29/13 0557  BP: 140/81  Pulse: 65  Temp: 97.8 F (36.6 C)  Resp:    Wt Readings from Last 3 Encounters:  10/29/13 58.6 kg (129 lb 3 oz)  09/28/13 54.432 kg (120 lb)  09/28/13 54.432 kg (120 lb)    Vital signs from this visit reviewed General Well developed, well nourished  Skin No rashes; anicteric HEENT No pharyngeal abnormalities; pupils equal Neck No masses, thyroidomegaly Chest Clear to auscultation Cardiac No murmus, gallops, rubs Abdomen BS active; no masses, tenderness, organomegaly Rectal external skin tags Extremities  no cyanosis, clubbing, edema Skeletal No deformities Neuro Alert; no focal abnormalities  **  Intake/Output from previous day: 04/11 0701 -  04/12 0700 In: 500 [I.V.:500] Out: 3 [Urine:3] Intake/Output this shift:    LAB RESULTS:  Recent Labs  10/26/13 2255 10/28/13 1657 10/29/13 0538  WBC 2.6* 2.4* 1.6*  HGB 10.2* 9.2* 9.3*  HCT 30.4* 27.5* 27.2*  PLT 178 166 137*   BMET Lab Results  Component Value Date   NA 141 10/29/2013   NA 141 10/28/2013   NA 144 10/26/2013   K 3.2* 10/29/2013   K 3.1* 10/28/2013   K 2.9* 10/26/2013   CL 101 10/29/2013   CL 100 10/28/2013   CL 101 10/26/2013   CO2 26 10/29/2013   CO2 26 10/28/2013  CO2 27 10/26/2013   GLUCOSE 96 10/29/2013   GLUCOSE 115* 10/28/2013   GLUCOSE 106* 10/26/2013   BUN 3* 10/29/2013   BUN 5* 10/28/2013   BUN 5* 10/26/2013   CREATININE 0.42* 10/29/2013   CREATININE 0.44* 10/28/2013   CREATININE 0.53 10/26/2013   CALCIUM 7.5* 10/29/2013   CALCIUM 8.0* 10/28/2013   CALCIUM 8.3* 10/26/2013   LFT  Recent Labs  10/26/13 2255 10/28/13 1657  PROT 6.2 5.7*  ALBUMIN 3.5 3.1*  AST 57* 76*  ALT 16 19  ALKPHOS 84 80  BILITOT 0.3 0.5   PT/INR No results found for this basename: INR, PROTIME   Hepatitis Panel No results found for this basename: HEPBSAG, HCVAB, HEPAIGM, HEPBIGM,  in the last 72 hours C-Diff No components found with this basename: cdiff   Lipase     Component Value Date/Time   LIPASE 26 10/05/2013 2014    Drugs of Abuse  No results found for this basename: labopia, cocainscrnur, labbenz, amphetmu, thcu, labbarb     RADIOLOGY STUDIES: Ct Abdomen Pelvis W Contrast  10/28/2013  COMPARISON:  10/05/2013.  09/17/2013.  FINDINGS: Motion artifact limits visualization of the lung bases. No focal infiltration identified.  Diffuse fatty infiltration of the liver. No focal lesions. The gallbladder, pancreas, spleen, adrenal glands, kidneys, abdominal aorta, inferior vena cava, and retroperitoneal lymph nodes are unremarkable. The stomach and small bowel are decompressed. Colon is decompressed. No free air or free fluid in the abdomen.  Pelvis: Small amount of free  fluid in the right pelvis is likely physiologic or possibly postoperative. Uterus is anteverted without enlargement. No abnormal adnexal masses. The appendix is surgically absent by history with surgical clips present in the right lower quadrant. The colon is decompressed and therefore cannot be effectively evaluated for wall thickening. No extra colonic infiltration or abscess. Bladder wall is not thickened. No significant pelvic lymphadenopathy. Spondylolysis with mild spondylolisthesis at L5-S1. No destructive bone lesions appreciated.  IMPRESSION: No acute process demonstrated in the abdomen or pelvis. Small amount of free fluid in the pelvis is likely physiologic. Diffuse fatty infiltration of the liver   Electronically Signed   By: Lucienne Capers M.D.   On: 10/28/2013 21:56    ENDOSCOPIC STUDIES: Colonoscopy, see HPI  IMPRESSION:   *  Hematochezia Recent unremarkable colonoscopy with rectal biopsies.  Telemetry suspect that bleeding is hemorrhoidal.  No evidence for proctitis by recent colonoscopy.  Abdominal pain and pelvic pain are unrelated.  No evidence for small bowel disease.  *  S/p 3/12 lap appy and right SPO for pllvic pain.  *  Hypokalemia  *  Normocytic anemia.   *  Fatty liver.  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  Leukopenia and thrombocytopenia raise the question of cirrhosis.  *  Normocytic anemia.    PLAN:     * Band ligation of internal hemorrhoids.  This will be arranged as an outpatient  Ok to discharge home. Serologies for hepatitis   A., B&C   Vena Rua  10/29/2013, 8:18 AM Pager: [phone]

## 2013-10-29 NOTE — Progress Notes (Signed)
Pt had episode of rectal bleeding, moderate amount noted. Denies rectal pain, does have back pain.  CBC already ordered, MD Rogue Bussing paged and made aware.  Advised to call him if any further bleeding, will get stat cbc if bleeding continues.

## 2013-10-29 NOTE — ED Notes (Signed)
Report given to jill rn.

## 2013-10-30 ENCOUNTER — Emergency Department (HOSPITAL_COMMUNITY)
Admission: EM | Admit: 2013-10-30 | Discharge: 2013-10-31 | Disposition: A | Discharge status: Home / Self Care | Attending: Emergency Medicine | Admitting: Emergency Medicine

## 2013-10-30 ENCOUNTER — Encounter (HOSPITAL_COMMUNITY): Payer: Self-pay | Admitting: Emergency Medicine

## 2013-10-30 DIAGNOSIS — Z3202 Encounter for pregnancy test, result negative: Secondary | ICD-10-CM | POA: Insufficient documentation

## 2013-10-30 DIAGNOSIS — Z9089 Acquired absence of other organs: Secondary | ICD-10-CM | POA: Insufficient documentation

## 2013-10-30 DIAGNOSIS — G8911 Acute pain due to trauma: Secondary | ICD-10-CM | POA: Insufficient documentation

## 2013-10-30 DIAGNOSIS — Z9181 History of falling: Secondary | ICD-10-CM | POA: Insufficient documentation

## 2013-10-30 DIAGNOSIS — S322XXA Fracture of coccyx, initial encounter for closed fracture: Secondary | ICD-10-CM

## 2013-10-30 DIAGNOSIS — Z905 Acquired absence of kidney: Secondary | ICD-10-CM | POA: Insufficient documentation

## 2013-10-30 DIAGNOSIS — R55 Syncope and collapse: Secondary | ICD-10-CM | POA: Insufficient documentation

## 2013-10-30 DIAGNOSIS — Z8742 Personal history of other diseases of the female genital tract: Secondary | ICD-10-CM | POA: Insufficient documentation

## 2013-10-30 DIAGNOSIS — Z862 Personal history of diseases of the blood and blood-forming organs and certain disorders involving the immune mechanism: Secondary | ICD-10-CM | POA: Insufficient documentation

## 2013-10-30 DIAGNOSIS — R112 Nausea with vomiting, unspecified: Secondary | ICD-10-CM | POA: Insufficient documentation

## 2013-10-30 DIAGNOSIS — M533 Sacrococcygeal disorders, not elsewhere classified: Secondary | ICD-10-CM | POA: Insufficient documentation

## 2013-10-30 DIAGNOSIS — R197 Diarrhea, unspecified: Secondary | ICD-10-CM | POA: Insufficient documentation

## 2013-10-30 DIAGNOSIS — Z8659 Personal history of other mental and behavioral disorders: Secondary | ICD-10-CM | POA: Insufficient documentation

## 2013-10-30 DIAGNOSIS — Z8719 Personal history of other diseases of the digestive system: Secondary | ICD-10-CM | POA: Insufficient documentation

## 2013-10-30 LAB — COMPREHENSIVE METABOLIC PANEL
ALK PHOS: 105 U/L (ref 39–117)
ALT: 20 U/L (ref 0–35)
AST: 68 U/L — AB (ref 0–37)
Albumin: 3.8 g/dL (ref 3.5–5.2)
BUN: 3 mg/dL — ABNORMAL LOW (ref 6–23)
CALCIUM: 8.4 mg/dL (ref 8.4–10.5)
CO2: 24 mEq/L (ref 19–32)
Chloride: 103 mEq/L (ref 96–112)
Creatinine, Ser: 0.55 mg/dL (ref 0.50–1.10)
GFR calc non Af Amer: >90 mL/min (ref >90)
GLUCOSE: 108 mg/dL — AB (ref 70–99)
POTASSIUM: 4.3 meq/L (ref 3.7–5.3)
SODIUM: 141 meq/L (ref 137–147)
TOTAL PROTEIN: 6.6 g/dL (ref 6.0–8.3)
Total Bilirubin: 0.5 mg/dL (ref 0.3–1.2)

## 2013-10-30 LAB — CBC WITH DIFFERENTIAL/PLATELET
BASOS ABS: 0 10*3/uL (ref 0.0–0.1)
Basophils Relative: 1 % (ref 0–1)
Eosinophils Absolute: 0 10*3/uL (ref 0.0–0.7)
Eosinophils Relative: 0 % (ref 0–5)
HCT: 32.1 % — ABNORMAL LOW (ref 36.0–46.0)
Hemoglobin: 10.5 g/dL — ABNORMAL LOW (ref 12.0–15.0)
LYMPHS PCT: 35 % (ref 12–46)
Lymphs Abs: 0.8 10*3/uL (ref 0.7–4.0)
MCH: 31.2 pg (ref 26.0–34.0)
MCHC: 32.7 g/dL (ref 30.0–36.0)
MCV: 95.3 fL (ref 78.0–100.0)
Monocytes Absolute: 0.3 10*3/uL (ref 0.1–1.0)
Monocytes Relative: 12 % (ref 3–12)
NEUTROS PCT: 52 % (ref 43–77)
Neutro Abs: 1.3 10*3/uL — ABNORMAL LOW (ref 1.7–7.7)
PLATELETS: 230 10*3/uL (ref 150–400)
RBC: 3.37 MIL/uL — AB (ref 3.87–5.11)
RDW: 17.1 % — AB (ref 11.5–15.5)
WBC: 2.4 10*3/uL — AB (ref 4.0–10.5)

## 2013-10-30 LAB — LIPASE, BLOOD: Lipase: 27 U/L (ref 11–59)

## 2013-10-30 LAB — I-STAT CG4 LACTIC ACID, ED: Lactic Acid, Venous: 2.24 mmol/L — ABNORMAL HIGH (ref 0.5–2.2)

## 2013-10-30 MED ORDER — SODIUM CHLORIDE 0.9 % IV BOLUS (SEPSIS)
1000.0000 mL | Freq: Once | INTRAVENOUS | Status: AC
  Administered 2013-10-30: 1000 mL via INTRAVENOUS

## 2013-10-30 MED ORDER — KETOROLAC TROMETHAMINE 30 MG/ML IJ SOLN
30.0000 mg | Freq: Once | INTRAMUSCULAR | Status: AC
  Administered 2013-10-31: 30 mg via INTRAVENOUS
  Filled 2013-10-30: qty 1

## 2013-10-30 MED ORDER — FENTANYL CITRATE 0.05 MG/ML IJ SOLN
50.0000 ug | Freq: Once | INTRAMUSCULAR | Status: AC
  Administered 2013-10-30: 50 ug via INTRAVENOUS
  Filled 2013-10-30: qty 2

## 2013-10-30 MED ORDER — SODIUM CHLORIDE 0.9 % IV BOLUS (SEPSIS)
1000.0000 mL | Freq: Once | INTRAVENOUS | Status: AC
  Administered 2013-10-31: 1000 mL via INTRAVENOUS

## 2013-10-30 MED ORDER — ONDANSETRON HCL 4 MG/2ML IJ SOLN
4.0000 mg | Freq: Once | INTRAMUSCULAR | Status: AC
  Administered 2013-10-31: 4 mg via INTRAVENOUS
  Filled 2013-10-30: qty 2

## 2013-10-30 MED ORDER — HYDROMORPHONE HCL PF 1 MG/ML IJ SOLN
1.0000 mg | Freq: Once | INTRAMUSCULAR | Status: AC
  Administered 2013-10-31: 1 mg via INTRAVENOUS
  Filled 2013-10-30: qty 1

## 2013-10-30 NOTE — ED Notes (Signed)
Pt states CP, EKG shot and shown to EDP.

## 2013-10-30 NOTE — ED Notes (Signed)
To ED from home via [place], reports multiple recent falls with admission this weekend, left [facility] Sunday.  Presents with N/V/D following a syncopal episode and c/o lower back pain, VSS, A/O on arrival, NAD

## 2013-10-30 NOTE — ED Provider Notes (Signed)
TIME SEEN: 11:20 PM  CHIEF COMPLAINT: Nausea, vomiting, diarrhea  HPI: Patient is a 38 y.o. F with history of ovarian cysts, seizures, anxiety and depression who presents to the emergency department with complaints of nausea, vomiting and diarrhea as in present for the past week. She was recently admitted to the hospital for rectal bleeding but left AGAINST MEDICAL ADVICE. She states that she was feeling much better and today her nausea, vomiting diarrhea started again. She is also complaining of pain in her "tailbone". She states she fell recently and had a fracture to her coccyx. She states she had a syncopal episode today in the car on the way to the emergency department due to pain from his fracture. No other new falls. She denies any chest pain or shortness of breath. No palpitations. No numbness, tingling or focal weakness. No bowel or bladder incontinence. No current bloody stools or melena.  ROS: See HPI Constitutional: no fever  Eyes: no drainage  ENT: no runny nose   Cardiovascular:  no chest pain  Resp: no SOB  GI:  vomiting GU: no dysuria Integumentary: no rash  Allergy: no hives  Musculoskeletal: no leg swelling  Neurological: no slurred speech ROS otherwise negative  PAST MEDICAL HISTORY/PAST SURGICAL HISTORY:  Past Medical History  Diagnosis Date  . Proctitis   . Cysts of both ovaries   . Seizures   . Anemia   . Anxiety   . Blood transfusion without reported diagnosis   . Depression   . Fatty liver 10/05/13    MEDICATIONS:  Prior to Admission medications   Medication Sig Start Date End Date Taking? Authorizing Provider  HYDROcodone-acetaminophen (NORCO/VICODIN) 5-325 MG per tablet Take 1 tablet by mouth every 6 (six) hours as needed for moderate pain. 10/27/13  Yes Garald Balding, NP  ibuprofen (ADVIL,MOTRIN) 200 MG tablet Take 200 mg by mouth every 6 (six) hours as needed for moderate pain.   Yes Historical Provider, MD    ALLERGIES:  Allergies  Allergen  Reactions  . Morphine And Related Anaphylaxis        . Tramadol Other (See Comments)    Seizures   . Penicillins Other (See Comments)    Unknown childhood reaction.    SOCIAL HISTORY:  History  Substance Use Topics  . Smoking status: Never Smoker   . Smokeless tobacco: Not on file  . Alcohol Use: Yes    FAMILY HISTORY: Family History  Problem Relation Age of Onset  . Diabetes Mother   . Hyperlipidemia Mother   . Stroke Mother   . Diabetes Father     EXAM: BP 97/57  Pulse 89  Temp(Src) 97.6 F (36.4 C)  Resp 16  SpO2 100%  LMP 09/25/2013 CONSTITUTIONAL: Alert and oriented and responds appropriately to questions. Well-appearing; well-nourished HEAD: Normocephalic EYES: Conjunctivae clear, PERRL ENT: normal nose; no rhinorrhea; moist mucous membranes; pharynx without lesions noted NECK: Supple, no meningismus, no LAD  CARD: RRR; S1 and S2 appreciated; no murmurs, no clicks, no rubs, no gallops RESP: Normal chest excursion without splinting or tachypnea; breath sounds clear and equal bilaterally; no wheezes, no rhonchi, no rales,  ABD/GI: Normal bowel sounds; non-distended; soft, non-tender, no rebound, no guarding BACK:  The back appears normal and is non-tender to palpation, there is no CVA tenderness EXT: Normal ROM in all joints; non-tender to palpation; no edema; normal capillary refill; no cyanosis    SKIN: Normal color for age and race; warm NEURO: Moves all extremities equally PSYCH: The  patient's mood and manner are appropriate. Grooming and personal hygiene are appropriate.  MEDICAL DECISION MAKING: Patient here with nausea, vomiting and diarrhea for a week. She has had a recent CT scan which showed no abnormality. She is status post appendectomy and right nephrectomy. Her abdominal exam is benign. Her labs are unremarkable other than very mild elevation of her lactate. We'll also check urinalysis, urine pregnancy test. We'll give pain and nausea medicine and  by mouth challenge. Do not feel she needs repeat abdominal imaging at this time.  ED PROGRESS: Patient's urine shows no sign of infection and her urine pregnancy test is negative. Her pain is better controlled after IV pain medication and she is tolerating by mouth. We'll discharge patient home with prescription for Percocet. Have given return precautions. She verbalizes understanding and is comfortable plan.     EKG Interpretation  Date/Time:  Monday October 30 2013 23:06:23 EDT Ventricular Rate:  79 PR Interval:  141 QRS Duration: 88 QT Interval:  437 QTC Calculation: 501 R Axis:   81 Text Interpretation:  Sinus rhythm Probable left atrial enlargement Low voltage, precordial leads Nonspecific T abnrm, anterolateral leads Borderline prolonged QT interval Confirmed by DOCHERTY  MD, MEGAN ([id number]) on 10/30/2013 11:49:03 PM        [place], DO 10/31/13 0222

## 2013-10-31 LAB — POC URINE PREG, ED: PREG TEST UR: NEGATIVE

## 2013-10-31 LAB — URINALYSIS, ROUTINE W REFLEX MICROSCOPIC
Bilirubin Urine: NEGATIVE
GLUCOSE, UA: NEGATIVE mg/dL
HGB URINE DIPSTICK: NEGATIVE
Ketones, ur: NEGATIVE mg/dL
LEUKOCYTES UA: NEGATIVE
Nitrite: NEGATIVE
PH: 7.5 (ref 5.0–8.0)
PROTEIN: NEGATIVE mg/dL
Specific Gravity, Urine: 1.011 (ref 1.005–1.030)
Urobilinogen, UA: 1 mg/dL (ref 0.0–1.0)

## 2013-10-31 MED ORDER — HYDROMORPHONE HCL PF 1 MG/ML IJ SOLN
1.0000 mg | Freq: Once | INTRAMUSCULAR | Status: AC
  Administered 2013-10-31: 1 mg via INTRAVENOUS
  Filled 2013-10-31: qty 1

## 2013-10-31 MED ORDER — OXYCODONE-ACETAMINOPHEN 5-325 MG PO TABS: 1.0000 | ORAL_TABLET | ORAL | Status: DC | PRN

## 2013-10-31 NOTE — Discharge Instructions (Signed)
Diarrhea Diarrhea is frequent loose and watery bowel movements. It can cause you to feel weak and dehydrated. Dehydration can cause you to become tired and thirsty, have a dry mouth, and have decreased urination that often is dark yellow. Diarrhea is a sign of another problem, most often an infection that will not last long. In most cases, diarrhea typically lasts 2 3 days. However, it can last longer if it is a sign of something more serious. It is important to treat your diarrhea as directed by your caregive to lessen or prevent future episodes of diarrhea. CAUSES  Some common causes include:  Gastrointestinal infections caused by viruses, bacteria, or parasites.  Food poisoning or food allergies.  Certain medicines, such as antibiotics, chemotherapy, and laxatives.  Artificial sweeteners and fructose.  Digestive disorders. HOME CARE INSTRUCTIONS  Ensure adequate fluid intake (hydration): have 1 cup (8 oz) of fluid for each diarrhea episode. Avoid fluids that contain simple sugars or sports drinks, fruit juices, whole milk products, and sodas. Your urine should be clear or pale yellow if you are drinking enough fluids. Hydrate with an oral rehydration solution that you can purchase at pharmacies, retail stores, and online. You can prepare an oral rehydration solution at home by mixing the following ingredients together:    tsp table salt.   tsp baking soda.   tsp salt substitute containing potassium chloride.  1  tablespoons sugar.  1 L (34 oz) of water.  Certain foods and beverages may increase the speed at which food moves through the gastrointestinal (GI) tract. These foods and beverages should be avoided and include:  Caffeinated and alcoholic beverages.  High-fiber foods, such as raw fruits and vegetables, nuts, seeds, and whole grain breads and cereals.  Foods and beverages sweetened with sugar alcohols, such as xylitol, sorbitol, and mannitol.  Some foods may be well  tolerated and may help thicken stool including:  Starchy foods, such as rice, toast, pasta, low-sugar cereal, oatmeal, grits, baked potatoes, crackers, and bagels.  Bananas.  Applesauce.  Add probiotic-rich foods to help increase healthy bacteria in the GI tract, such as yogurt and fermented milk products.  Wash your hands well after each diarrhea episode.  Only take over-the-counter or prescription medicines as directed by your caregiver.  Take a warm bath to relieve any burning or pain from frequent diarrhea episodes. SEEK IMMEDIATE MEDICAL CARE IF:   You are unable to keep fluids down.  You have persistent vomiting.  You have blood in your stool, or your stools are black and tarry.  You do not urinate in 6 8 hours, or there is only a small amount of very dark urine.  You have abdominal pain that increases or localizes.  You have weakness, dizziness, confusion, or lightheadedness.  You have a severe headache.  Your diarrhea gets worse or does not get better.  You have a fever or persistent symptoms for more than 2 3 days.  You have a fever and your symptoms suddenly get worse. MAKE SURE YOU:   Understand these instructions.  Will watch your condition.  Will get help right away if you are not doing well or get worse. Document Released: 06/26/2002 Document Revised: 06/22/2012 Document Reviewed: 03/13/2012 [facility] Patient Information 2014 [place], [place].  Nausea and Vomiting Nausea is a sick feeling that often comes before throwing up (vomiting). Vomiting is a reflex where stomach contents come out of your mouth. Vomiting can cause severe loss of body fluids (dehydration). Children and elderly adults can become  dehydrated quickly, especially if they also have diarrhea. Nausea and vomiting are symptoms of a condition or disease. It is important to find the cause of your symptoms. CAUSES   Direct irritation of the stomach lining. This irritation can result from  increased acid production (gastroesophageal reflux disease), infection, food poisoning, taking certain medicines (such as nonsteroidal anti-inflammatory drugs), alcohol use, or tobacco use.  Signals from the brain.These signals could be caused by a headache, heat exposure, an inner ear disturbance, increased pressure in the brain from injury, infection, a tumor, or a concussion, pain, emotional stimulus, or metabolic problems.  An obstruction in the gastrointestinal tract (bowel obstruction).  Illnesses such as diabetes, hepatitis, gallbladder problems, appendicitis, kidney problems, cancer, sepsis, atypical symptoms of a heart attack, or eating disorders.  Medical treatments such as chemotherapy and radiation.  Receiving medicine that makes you sleep (general anesthetic) during surgery. DIAGNOSIS Your caregiver may ask for tests to be done if the problems do not improve after a few days. Tests may also be done if symptoms are severe or if the reason for the nausea and vomiting is not clear. Tests may include:  Urine tests.  Blood tests.  Stool tests.  Cultures (to look for evidence of infection).  X-rays or other imaging studies. Test results can help your caregiver make decisions about treatment or the need for additional tests. TREATMENT You need to stay well hydrated. Drink frequently but in small amounts.You may wish to drink water, sports drinks, clear broth, or eat frozen ice pops or gelatin dessert to help stay hydrated.When you eat, eating slowly may help prevent nausea.There are also some antinausea medicines that may help prevent nausea. HOME CARE INSTRUCTIONS   Take all medicine as directed by your caregiver.  If you do not have an appetite, do not force yourself to eat. However, you must continue to drink fluids.  If you have an appetite, eat a normal diet unless your caregiver tells you differently.  Eat a variety of complex carbohydrates (rice, wheat, potatoes,  bread), lean meats, yogurt, fruits, and vegetables.  Avoid high-fat foods because they are more difficult to digest.  Drink enough water and fluids to keep your urine clear or pale yellow.  If you are dehydrated, ask your caregiver for specific rehydration instructions. Signs of dehydration may include:  Severe thirst.  Dry lips and mouth.  Dizziness.  Dark urine.  Decreasing urine frequency and amount.  Confusion.  Rapid breathing or pulse. SEEK IMMEDIATE MEDICAL CARE IF:   You have blood or brown flecks (like coffee grounds) in your vomit.  You have black or bloody stools.  You have a severe headache or stiff neck.  You are confused.  You have severe abdominal pain.  You have chest pain or trouble breathing.  You do not urinate at least once every 8 hours.  You develop cold or clammy skin.  You continue to vomit for longer than 24 to 48 hours.  You have a fever. MAKE SURE YOU:   Understand these instructions.  Will watch your condition.  Will get help right away if you are not doing well or get worse. Document Released: 07/06/2005 Document Revised: 09/28/2011 Document Reviewed: 12/03/2010 [facility] Patient Information 2014 [place], [place].  Tailbone Injury The tailbone (coccyx) is the small bone at the lower end of the spine. A tailbone injury may involve stretched ligaments, bruising, or a broken bone (fracture). Women are more vulnerable to this injury due to having a wider pelvis. CAUSES  This  type of injury typically occurs from falling and landing on the tailbone. Repeated strain or friction from actions such as rowing and bicycling may also injure the area. The tailbone can be injured during childbirth. Infections or tumors may also press on the tailbone and cause pain. Sometimes, the cause of injury is unknown. SYMPTOMS   Bruising.  Pain when sitting.  Painful bowel movements.  In women, pain during intercourse. DIAGNOSIS  Your caregiver can  diagnose a tailbone injury based on your symptoms and a physical exam. X-rays may be taken if a fracture is suspected. Your caregiver may also use an MRI scan imaging test to evaluate your symptoms. TREATMENT  Your caregiver may prescribe medicines to help relieve your pain. Most tailbone injuries heal on their own in 4 to 6 weeks. However, if the injury is caused by an infection or tumor, the recovery period may vary. PREVENTION  Wear appropriate padding and sports gear when bicycling and rowing. This can help prevent an injury from repeated strain or friction. HOME CARE INSTRUCTIONS   Put ice on the injured area.  Put ice in a plastic bag.  Place a towel between your skin and the bag.  Leave the ice on for 15-20 minutes, every hour while awake for the first 1 to 2 days.  Sit on a large, rubber or inflated ring or cushion to ease your pain. Lean forward when sitting to help decrease discomfort.  Avoid sitting for long periods of time.  Increase your activity as the pain allows.  Only take over-the-counter or prescription medicines for pain, discomfort, or fever as directed by your caregiver.  You may use stool softeners if it is painful to have a bowel movement, or as directed by your caregiver.  Eat a diet with plenty of fiber to help prevent constipation.  Keep all follow-up appointments as directed by your caregiver. SEEK MEDICAL CARE IF:   Your pain becomes worse.  Your bowel movements cause a great deal of discomfort.  You are unable to have a bowel movement.  You have a fever. MAKE SURE YOU:  Understand these instructions.  Will watch your condition.  Will get help right away if you are not doing well or get worse. Document Released: 07/03/2000 Document Revised: 09/28/2011 Document Reviewed: 01/29/2011 [facility] Patient Information 2014 [place], [place].

## 2013-10-31 NOTE — ED Notes (Signed)
Pt tolerating PO with no difficulty

## 2013-11-02 ENCOUNTER — Emergency Department (HOSPITAL_COMMUNITY)
Admission: EM | Admit: 2013-11-02 | Discharge: 2013-11-02 | Disposition: A | Discharge status: Home / Self Care | Attending: Emergency Medicine | Admitting: Emergency Medicine

## 2013-11-02 ENCOUNTER — Encounter (HOSPITAL_COMMUNITY): Payer: Self-pay | Admitting: Emergency Medicine

## 2013-11-02 DIAGNOSIS — Z862 Personal history of diseases of the blood and blood-forming organs and certain disorders involving the immune mechanism: Secondary | ICD-10-CM | POA: Insufficient documentation

## 2013-11-02 DIAGNOSIS — Z880 Allergy status to penicillin: Secondary | ICD-10-CM | POA: Insufficient documentation

## 2013-11-02 DIAGNOSIS — Z8781 Personal history of (healed) traumatic fracture: Secondary | ICD-10-CM | POA: Insufficient documentation

## 2013-11-02 DIAGNOSIS — Z8669 Personal history of other diseases of the nervous system and sense organs: Secondary | ICD-10-CM | POA: Insufficient documentation

## 2013-11-02 DIAGNOSIS — Z8742 Personal history of other diseases of the female genital tract: Secondary | ICD-10-CM | POA: Insufficient documentation

## 2013-11-02 DIAGNOSIS — Z8659 Personal history of other mental and behavioral disorders: Secondary | ICD-10-CM | POA: Insufficient documentation

## 2013-11-02 DIAGNOSIS — K644 Residual hemorrhoidal skin tags: Secondary | ICD-10-CM | POA: Insufficient documentation

## 2013-11-02 LAB — CBC
HEMATOCRIT: 31.4 % — AB (ref 36.0–46.0)
HEMOGLOBIN: 10.4 g/dL — AB (ref 12.0–15.0)
MCH: 31 pg (ref 26.0–34.0)
MCHC: 33.1 g/dL (ref 30.0–36.0)
MCV: 93.5 fL (ref 78.0–100.0)
Platelets: 202 10*3/uL (ref 150–400)
RBC: 3.36 MIL/uL — AB (ref 3.87–5.11)
RDW: 17.7 % — ABNORMAL HIGH (ref 11.5–15.5)
WBC: 2.8 10*3/uL — ABNORMAL LOW (ref 4.0–10.5)

## 2013-11-02 LAB — COMPREHENSIVE METABOLIC PANEL
ALK PHOS: 128 U/L — AB (ref 39–117)
ALT: 29 U/L (ref 0–35)
AST: 139 U/L — ABNORMAL HIGH (ref 0–37)
Albumin: 3.6 g/dL (ref 3.5–5.2)
BILIRUBIN TOTAL: 0.6 mg/dL (ref 0.3–1.2)
BUN: 5 mg/dL — ABNORMAL LOW (ref 6–23)
CO2: 22 mEq/L (ref 19–32)
Calcium: 8.5 mg/dL (ref 8.4–10.5)
Chloride: 104 mEq/L (ref 96–112)
Creatinine, Ser: 0.69 mg/dL (ref 0.50–1.10)
GFR calc non Af Amer: >90 mL/min (ref >90)
GLUCOSE: 111 mg/dL — AB (ref 70–99)
POTASSIUM: 4.2 meq/L (ref 3.7–5.3)
Sodium: 144 mEq/L (ref 137–147)
TOTAL PROTEIN: 6.5 g/dL (ref 6.0–8.3)

## 2013-11-02 MED ORDER — ONDANSETRON 4 MG PO TBDP
8.0000 mg | ORAL_TABLET | Freq: Once | ORAL | Status: AC
  Administered 2013-11-02: 8 mg via ORAL
  Filled 2013-11-02: qty 2

## 2013-11-02 MED ORDER — HYDROMORPHONE HCL PF 1 MG/ML IJ SOLN
1.0000 mg | Freq: Once | INTRAMUSCULAR | Status: AC
  Administered 2013-11-02: 1 mg via INTRAMUSCULAR
  Filled 2013-11-02: qty 1

## 2013-11-02 MED ORDER — HYDROCORTISONE 2.5 % RE CREA: TOPICAL_CREAM | RECTAL | Status: DC

## 2013-11-02 MED ORDER — KETOROLAC TROMETHAMINE 30 MG/ML IJ SOLN
30.0000 mg | Freq: Once | INTRAMUSCULAR | Status: AC
Start: 2013-11-02 — End: 2013-11-02
  Administered 2013-11-02: 30 mg via INTRAMUSCULAR
  Filled 2013-11-02: qty 1

## 2013-11-02 MED ORDER — HYDROCODONE-ACETAMINOPHEN 5-325 MG PO TABS: 1.0000 | ORAL_TABLET | Freq: Four times a day (QID) | ORAL | Status: DC | PRN

## 2013-11-02 NOTE — ED Notes (Signed)
PT began groaning and states she is having a back spasm. Went to MD who prescribed 30mg  IM toradol and administered medication to PT

## 2013-11-02 NOTE — ED Notes (Addendum)
Pt is tearful and crying that she is in pain today. Her friend brought her in because he states when he went to check on her today he found blood in her bathtub and she thinks she is bleeding "from her bum, maybe my hemmorhoids" she was treated here for a coccyx fracture recently. She is alert and breathing easily.
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
## 2013-11-02 NOTE — Discharge Instructions (Signed)
°Emergency Department Resource Guide °1) Find a Doctor and Pay Out of Pocket °Although you won't have to find out who is covered by your insurance plan, it is a good idea to ask around and get recommendations. You will then need to call the office and see if the doctor you have chosen will accept you as a new patient and what types of options they offer for patients who are self-pay. Some doctors offer discounts or will set up payment plans for their patients who do not have insurance, but you will need to ask so you aren't surprised when you get to your appointment. ° °2) Contact Your Local Health Department °Not all health departments have doctors that can see patients for sick visits, but many do, so it is worth a call to see if yours does. If you don't know where your local health department is, you can check in your phone book. The CDC also has a tool to help you locate your state's health department, and many state websites also have listings of all of their local health departments. ° °3) Find a Walk-in Clinic °If your illness is not likely to be very severe or complicated, you may want to try a walk in clinic. These are popping up all over the country in pharmacies, drugstores, and shopping centers. They're usually staffed by nurse practitioners or physician assistants that have been trained to treat common illnesses and complaints. They're usually fairly quick and inexpensive. However, if you have serious medical issues or chronic medical problems, these are probably not your best option. ° °No Primary Care Doctor: °- Call [facility] Connect at  832-8000 - they can help you locate a primary care doctor that  accepts your insurance, provides certain services, etc. °- Physician Referral Service- 1-800-533-3463 ° °Chronic Pain Problems: °Organization         Address  Phone   Notes  °[facility]  (336) 297-2271 Patients need to be referred by their primary care doctor.  ° °Medication  Assistance: °Organization         Address  Phone   Notes  °[place] [facility] [address] °[place], [place] [address] (336) 641-8030 --Must be a resident of [place] °-- Must have NO insurance coverage whatsoever (no Medicaid/ Medicare, etc.) °-- The pt. MUST have a primary care doctor that directs their care regularly and follows them in the community °  °[facility]  (866) 331-1348   °[employer]  (888) 892-1162   ° °Agencies that provide inexpensive medical care: °Organization         Address  Phone   Notes  °[facility]  (336) 832-8035   °[facility]    (336) 832-7272   °[facility] [address] °[place], [place] [address] (336) 832-4777   °[facility] [address], °[place] (336) 271-4999   °[facility]    (336) 373-0678   °[facility]    (336) 272-1050   °[facility] ° [address], [place] Phone:  (336) 832-4444, Fax:  (336) 832-4440 Hours of Operation:  9 am - 6 pm, M-F.  Also accepts Medicaid/Medicare and self-pay.  °[facility] ° [address], [place] Phone: (336) 832-3150, Fax: (336) [phone]. Hours of Operation:  8:30 am - 5:30 pm, M-F.  Also accepts Medicaid and self-pay.  °[facility] [place] [address], [place] Phone: (336) 878-6027   °[facility] [address], [place], [place] (336)723-1848, Ext. 123 Mondays & Thursdays: 7-9 AM.  First 15 patients are seen on a first come, first serve basis. °  ° °Medicaid-accepting [place] Providers: ° °Organization         Address  Phone   Notes  °[facility] [address], [place] (336) 641-2100 Also accepts self-pay patients.  °[facility] [address], [place] ° (336) 856-9996   °[facility] [address], [place]  (336) 288-8857   °[facility] [address] [place] [address], [place] (336) 299-7000   °Veita Bland [address], [place]  ° (336) 373-1557 Only accepts [place] Access Medicaid patients after they have their name applied to their card.  ° °Self-Pay (no insurance) in [place]: ° °Organization         Address  Phone   Notes  °Sickle Cell Patients, [facility] [address], [place] (336) 832-1970   °[facility] [address], [place] (336) 832-4400   °[facility] ° [address], [place] (336) 992-4800   °[facility]/Dr. Osei-Bonsu ° [address] [place] [address], [place] or [address], [place] (336) [phone] Phone number for both [place] and [place] locations is the same.  °[facility] [address], [place] (336) 299-0000   °[facility] [address] [place] [address], [place] or [address] (336) 852-7530 °(336) 878-2260   °[facility] [address], [place] (336) 350-1642, phone; (336) 294-5005, fax Sees patients 1st and 3rd Saturday of every month.  Must not qualify for public or private insurance (i.e. Medicaid, Medicare, [place] Health Choice, Veterans' Benefits) • Household income should be no more than 200% of the poverty level •The clinic cannot treat you if you are pregnant or think you are pregnant • Sexually transmitted diseases are not treated at the clinic.  ° ° °Dental Care: °Organization         Address  Phone  Notes  °[place] [facility] [address], [place] (336) 641-6152 Accepts children up to age 21 who are enrolled in Medicaid or [place] Health Choice; pregnant women with a Medicaid card; and children who have applied for Medicaid or [place] Health Choice, but were declined, whose parents can pay a reduced fee at time of service.  °[place]  [facility] [place]  [address], [place] (336) 641-7733 Accepts children up to age 21 who are enrolled in Medicaid or [place] Health Choice; pregnant women with a Medicaid card; and children who have applied for Medicaid or [place] Health Choice, but were declined, whose parents can pay a reduced fee at time of service.  °[facility] ° [address], [place] (336) 641-4533 Patients are seen by appointment only. Walk-ins are not accepted. [facility] will see patients 18 years of age and older. °Monday - Tuesday (8am-5pm) °Most Wednesdays (8:30-5pm) °$30 per visit, cash only  °[facility] ° [address], [place] (336) 641-4533 Patients are seen by appointment only. Walk-ins are not accepted. [facility] will see patients 18 years of age and older. °One   Wednesday Evening (Monthly: Volunteer Based).  $30 per visit, cash only  °[facility]  (919) 537-3737 for adults; Children under age 4, call Graduate Pediatric Dentistry at ([phone]) [phone]. Children aged 4-14, please call (919) [phone] to request a pediatric application. ° Dental services are provided in all areas of dental care including fillings, crowns and bridges, complete and partial dentures, implants, gum treatment, root canals, and extractions. Preventive care is also provided. Treatment is provided to both adults and children. °Patients are selected via a lottery and there is often a waiting list. °  °[facility] [address], °[place] ° (336) 763-8833 [email or web address] °  °[facility] [address], [place], [place] (336)723-1848, Ext. 123 Second and Fourth Thursday of each month, opens at 6:30 AM; Clinic ends at 9 AM.  Patients are seen on a first-come first-served basis, and a limited number are seen during each clinic.  ° °[facility] ° [address], [place], [place] (336) 723-7904    Eligibility Requirements °You must have lived in [place], [place], or [place] counties for at least the last three months. °  You cannot be eligible for state or federal sponsored healthcare insurance, including Veterans Administration, Medicaid, or Medicare. °  You generally cannot be eligible for healthcare insurance through your employer.  °  How to apply: °Eligibility screenings are held every Tuesday and Wednesday afternoon from 1:00 pm until 4:00 pm. You do not need an appointment for the interview!  °[facility] [address], [place], [place] 336-631-2330   °[facility] [facility]  336-342-8273   °[place]  336-703-3100   ° [facility]  336-570-6415   ° °Behavioral Health Resources in the Community: °Intensive Outpatient Programs °Organization         Address  Phone  Notes  °[facility] [address], [place], [place] 336-878-6098   °[facility] [address], [place], [place] 336-832-9800   °[facility] [address], [place], [place] ° 336-882-2125   °[facility] [address],  °[place], [place] 1-800-853-5163 or 336-641-4981   °Substance Abuse Resources °Organization         Address  Phone  Notes  °[facility]  336-882-2125   °[facility]  336-784-9470   °[facility]  336-285-9073   °[facility]  336-845-3988   °Residential & Outpatient Substance Abuse Program  1-800-659-3381   °Psychological Services °Organization         Address  Phone  Notes  °[facility]  336- 832-9600   °[facility]  336- 378-7881   °[facility] [address], [place] 1-800-853-5163 or 336-641-4981   ° °Mobile Crisis Teams °Organization         Address  Phone  Notes  °[facility], Mobile Crisis Care Unit  1-877-626-1772   °Assertive °[facility] ° [address]  [place], [place] 336-834-9664   °Sharon DeEsch [address] °[place] 336-554-5454   ° °Self-Help/Support Groups °Organization         Address  Phone             Notes  °[facility] [place] - variety of support groups  336- 373-1402 Call for more information  °Narcotics Anonymous (NA), [facility] [address], °[place]   2 meetings at this location  ° °  Residential Treatment Programs °Organization         Address  Phone  Notes  °[facility] [address],    °[place]  1-866-801-8205   °[facility] ° [address], [place], [place] 704-293-8524   °[facility] [address], [place] 336-845-3988 Admissions: 8am-3pm M-F  °[facility] [address]    °[place], [place] 336-841-1104   °[facility] [address], [place], [place] 336-379-7146   °[facility] [address]  °[place], [place] 336-285-9073   °[facility] - Intensive Outpatient [address], [place], [place] 336-852-3033   °[facility] [address]  °[place], [place] 1-877-615-2722 or 336-784-9470   °[facility] [address] [place], [place] 336-227-7417 Accepts Medicaid  °[facility] [address]  °[place] 1-800-659-3381 Substance Abuse/Addiction Treatment  ° °[place] Behavioral Health Resources °Organization         Address  Phone  Notes  °[facility]  (888) 581-9988   °Julie Brannon, PhD [address] [place], [place]   (336) 349-5553 or (336) 951-0000   °[facility]   [address] °[place], [place] (336) 349-4454   °[facility] [address], [place], [place] (336) 342-8316 Insurance/Medicaid/sponsorship through [facility]  °[facility] [address]                                    [place], [place] (336) 342-8316 Therapy/tele-psych/case    °[facility] [address]  ° [place], [place] (336) 349-2233    °Dr. Arfeen  (336) 349-4544   °[facility] [place]  [employer] [place] [facility] 1) [address], [place] °2) [address], [place] °3)  [address], [place] (336) 349-3220 °(336) 342-7768 ° °(336) 342-8140   °[place] Child Abuse [facility] (336) 342-1394 or (336) 342-3537 (After Hours)    ° ° °

## 2013-11-02 NOTE — ED Notes (Signed)
Was reading PT discharge papers and she began to fill nausea and dizziness from the medication. 02 sats dropped below 90%. Place on 2L [place] and PT returned to 100%.

## 2013-11-02 NOTE — ED Provider Notes (Signed)
CSN: [id number]     Arrival date & time 11/02/13  79 History   First MD Initiated Contact with Patient 11/02/13 1939     Chief Complaint  Patient presents with  . Rectal Pain     (Consider location/radiation/quality/duration/timing/severity/associated sxs/prior Treatment) The history is provided by the patient and medical records.   history of present illness: 38 year-old female with history of coccyx fracture several weeks ago who presents with chief complaint of pain at her rectum. Patient has had pain since her coccyx fracture. She denies any new trauma. Patient states that she had pain right around the rectum today.  Pain has been constant, rated severe. She had associated small amount of bright red blood from the rectum when she was sitting in the bathtub today. Patient has history of hemorrhoids. States that she took one Percocet at home but the medication is "too strong and makes me sick to my stomach".  Past Medical History  Diagnosis Date  . Proctitis   . Cysts of both ovaries   . Seizures   . Anemia   . Anxiety   . Blood transfusion without reported diagnosis   . Depression   . Fatty liver 10/05/13   Past Surgical History  Procedure Laterality Date  . Ovarian cyst removal    . Laparoscopy N/A 09/28/2013    Procedure: LAPAROSCOPY OPERATIVE;  Surgeon: Terrance Mass, MD;  Location: [facility];  Service: Gynecology;  Laterality: N/A;  . Laparoscopic appendectomy Right 09/28/2013    Procedure: APPENDECTOMY LAPAROSCOPIC;  Surgeon: Terrance Mass, MD;  Location: [facility];  Service: Gynecology;  Laterality: Right;  . Salpingoophorectomy Right 09/28/2013    Procedure: SALPINGO OOPHORECTOMY;  Surgeon: Terrance Mass, MD;  Location: [facility];  Service: Gynecology;  Laterality: Right;  . Colonoscopy N/A 09/30/2013    Procedure: COLONOSCOPY;  Surgeon: Lafayette Dragon, MD;  Location: [facility];  Service: Endoscopy;  Laterality: N/A;   Family History  Problem Relation Age of Onset  .  Diabetes Mother   . Hyperlipidemia Mother   . Stroke Mother   . Diabetes Father    History  Substance Use Topics  . Smoking status: Never Smoker   . Smokeless tobacco: Not on file  . Alcohol Use: Yes   OB History   Grav Para Term Preterm Abortions TAB SAB Ect Mult Living   7 3   4  4   3      Review of Systems  Constitutional: Negative for fever and chills.  HENT: Negative for congestion.   Eyes: Negative for pain.  Respiratory: Negative for shortness of breath.   Cardiovascular: Negative for chest pain.  Gastrointestinal: Positive for anal bleeding. Negative for nausea, vomiting, abdominal pain, diarrhea and constipation.       Rectal pain  Genitourinary: Negative for dysuria, hematuria, vaginal bleeding and vaginal discharge.  Musculoskeletal: Negative for back pain.  Skin: Negative for rash and wound.  Neurological: Negative for headaches.  All other systems reviewed and are negative.     Allergies  Morphine and related; Tramadol; and Penicillins  Home Medications   Prior to Admission medications   Medication Sig Start Date End Date Taking? Authorizing Provider  oxyCODONE-acetaminophen (PERCOCET/ROXICET) 5-325 MG per tablet Take 1 tablet by mouth every 4 (four) hours as needed. 10/31/13  Yes Kristen N Ward, DO  HYDROcodone-acetaminophen (NORCO/VICODIN) 5-325 MG per tablet Take 1 tablet by mouth every 6 (six) hours as needed for moderate pain. 10/27/13   Garald Balding, NP  ibuprofen (ADVIL,MOTRIN) 200 MG tablet Take 200 mg by mouth every 6 (six) hours as needed for moderate pain.    Historical Provider, MD   BP 115/80  Pulse 96  Temp(Src) 98.4 F (36.9 C) (Oral)  Resp 16  SpO2 100%  LMP 09/25/2013 Physical Exam  Nursing note and vitals reviewed. Constitutional: She is oriented to person, place, and time. She appears well-developed and well-nourished. No distress.  HENT:  Head: Normocephalic and atraumatic.  Eyes: Conjunctivae are normal.  Neck: Neck supple.   Cardiovascular: Normal rate, regular rhythm, normal heart sounds and intact distal pulses.   Pulmonary/Chest: Effort normal and breath sounds normal. She has no wheezes. She has no rales.  Abdominal: Soft. She exhibits no distension. There is no tenderness.  Genitourinary: Rectal exam shows external hemorrhoid (No active bleeding or thrombosed hemorrhoid.).  Musculoskeletal: Normal range of motion.  Neurological: She is alert and oriented to person, place, and time.  Skin: Skin is warm and dry.    ED Course  Procedures (including critical care time) Labs Review Labs Reviewed  CBC - Abnormal; Notable for the following:    WBC 2.8 (*)    RBC 3.36 (*)    Hemoglobin 10.4 (*)    HCT 31.4 (*)    RDW 17.7 (*)    All other components within normal limits  COMPREHENSIVE METABOLIC PANEL - Abnormal; Notable for the following:    Glucose, Bld 111 (*)    BUN 5 (*)    AST 139 (*)    Alkaline Phosphatase 128 (*)    All other components within normal limits    Imaging Review No results found.   EKG Interpretation None      MDM   Final diagnoses:  External hemorrhoid    38 year old female with history of seizures, anemia, hemorrhoids, coccyx fracture who presents with worsening rectal pain and an episode of small amount of bright red blood per and while sitting in the bathtub today. No melena or hematochezia. AF VSS.  Exam with external hemorrhoids without evidence of active bleeding. No signs of thrombosed hemorrhoid. No blood on rectal exam.  Patient has had multiple recent emergency department visits for similar pain from her coccyx fracture. No new trauma. Prior ED and inpatient workup reviewed including recent CT scan of the abdomen and pelvis. Do not feel additional imaging warranted at this time. Patient also had a recent colonoscopy and evaluation by GI. Colonoscopy was normal. It was felt that intermittent episodes of bleeding were do to her external hemorrhoids.  Today  patient with unchanged hemoglobin and no signs of active bleeding. No evidence of thrombosed hemorrhoid or new trauma to the coccyx. Patient given Dilaudid 1 mg IM x2 for pain. States that the Percocet she has is too strong and makes her sick to her stomach. Will give very short course of Norco and steroid cream. Patient provided referral to general surgery. Instructed patient she would need to follow with PCP for additional pain medication.    Renaldo Reel, MD 11/02/13 [phone]

## 2013-11-03 NOTE — ED Provider Notes (Signed)
I saw and evaluated the patient, reviewed the resident's note and I agree with the findings and plan.   EKG Interpretation None       Patient with what appears to be a flare of her coccyx pain after a fall. However there been no new injuries. She had bright red blood per him per her but there is no blood on stool resident exam. There are hemorrhoids but no thrombosed hemorrhoids. At this point it seems the most about pain control. Her hemoglobin is the same as it was 3 days ago. Pain controlled in the ED and will take up her pain medicine to refer her to Gen. surgery for her hemorrhoids  Ephraim Hamburger, MD 11/03/13 [phone]

## 2013-11-04 ENCOUNTER — Encounter (HOSPITAL_COMMUNITY): Payer: Self-pay | Admitting: Emergency Medicine

## 2013-11-04 ENCOUNTER — Emergency Department (HOSPITAL_COMMUNITY)
Admission: EM | Admit: 2013-11-04 | Discharge: 2013-11-04 | Disposition: A | Discharge status: Home / Self Care | Attending: Emergency Medicine | Admitting: Emergency Medicine

## 2013-11-04 DIAGNOSIS — Z8669 Personal history of other diseases of the nervous system and sense organs: Secondary | ICD-10-CM | POA: Insufficient documentation

## 2013-11-04 DIAGNOSIS — Z8659 Personal history of other mental and behavioral disorders: Secondary | ICD-10-CM | POA: Insufficient documentation

## 2013-11-04 DIAGNOSIS — G8929 Other chronic pain: Secondary | ICD-10-CM | POA: Insufficient documentation

## 2013-11-04 DIAGNOSIS — S322XXA Fracture of coccyx, initial encounter for closed fracture: Secondary | ICD-10-CM

## 2013-11-04 DIAGNOSIS — G8911 Acute pain due to trauma: Secondary | ICD-10-CM | POA: Insufficient documentation

## 2013-11-04 DIAGNOSIS — Z862 Personal history of diseases of the blood and blood-forming organs and certain disorders involving the immune mechanism: Secondary | ICD-10-CM | POA: Insufficient documentation

## 2013-11-04 DIAGNOSIS — Z880 Allergy status to penicillin: Secondary | ICD-10-CM | POA: Insufficient documentation

## 2013-11-04 DIAGNOSIS — N949 Unspecified condition associated with female genital organs and menstrual cycle: Secondary | ICD-10-CM | POA: Insufficient documentation

## 2013-11-04 DIAGNOSIS — K625 Hemorrhage of anus and rectum: Secondary | ICD-10-CM | POA: Insufficient documentation

## 2013-11-04 DIAGNOSIS — IMO0002 Reserved for concepts with insufficient information to code with codable children: Secondary | ICD-10-CM | POA: Insufficient documentation

## 2013-11-04 DIAGNOSIS — R102 Pelvic and perineal pain: Secondary | ICD-10-CM

## 2013-11-04 DIAGNOSIS — R109 Unspecified abdominal pain: Secondary | ICD-10-CM | POA: Insufficient documentation

## 2013-11-04 LAB — CBC WITH DIFFERENTIAL/PLATELET
BASOS ABS: 0 10*3/uL (ref 0.0–0.1)
Basophils Relative: 1 % (ref 0–1)
EOS PCT: 0 % (ref 0–5)
Eosinophils Absolute: 0 10*3/uL (ref 0.0–0.7)
HEMATOCRIT: 28.1 % — AB (ref 36.0–46.0)
Hemoglobin: 9.4 g/dL — ABNORMAL LOW (ref 12.0–15.0)
LYMPHS ABS: 1 10*3/uL (ref 0.7–4.0)
Lymphocytes Relative: 47 % — ABNORMAL HIGH (ref 12–46)
MCH: 31.4 pg (ref 26.0–34.0)
MCHC: 33.5 g/dL (ref 30.0–36.0)
MCV: 94 fL (ref 78.0–100.0)
MONOS PCT: 10 % (ref 3–12)
Monocytes Absolute: 0.2 10*3/uL (ref 0.1–1.0)
Neutro Abs: 0.9 10*3/uL — ABNORMAL LOW (ref 1.7–7.7)
Neutrophils Relative %: 42 % — ABNORMAL LOW (ref 43–77)
PLATELETS: 167 10*3/uL (ref 150–400)
RBC: 2.99 MIL/uL — AB (ref 3.87–5.11)
RDW: 17.5 % — AB (ref 11.5–15.5)
WBC: 2.1 10*3/uL — ABNORMAL LOW (ref 4.0–10.5)

## 2013-11-04 LAB — COMPREHENSIVE METABOLIC PANEL
ALBUMIN: 3.3 g/dL — AB (ref 3.5–5.2)
ALK PHOS: 114 U/L (ref 39–117)
ALT: 27 U/L (ref 0–35)
AST: 137 U/L — AB (ref 0–37)
BILIRUBIN TOTAL: 0.8 mg/dL (ref 0.3–1.2)
BUN: 6 mg/dL (ref 6–23)
CO2: 21 meq/L (ref 19–32)
Calcium: 7.8 mg/dL — ABNORMAL LOW (ref 8.4–10.5)
Chloride: 106 mEq/L (ref 96–112)
Creatinine, Ser: 0.46 mg/dL — ABNORMAL LOW (ref 0.50–1.10)
GFR calc Af Amer: >90 mL/min (ref >90)
Glucose, Bld: 109 mg/dL — ABNORMAL HIGH (ref 70–99)
POTASSIUM: 3.9 meq/L (ref 3.7–5.3)
Sodium: 142 mEq/L (ref 137–147)
Total Protein: 5.8 g/dL — ABNORMAL LOW (ref 6.0–8.3)

## 2013-11-04 LAB — LIPASE, BLOOD: Lipase: 38 U/L (ref 11–59)

## 2013-11-04 MED ORDER — HYDROMORPHONE HCL PF 1 MG/ML IJ SOLN
1.0000 mg | Freq: Once | INTRAMUSCULAR | Status: AC
  Administered 2013-11-04: 1 mg via INTRAVENOUS
  Filled 2013-11-04: qty 1

## 2013-11-04 MED ORDER — HYDROMORPHONE HCL PF 1 MG/ML IJ SOLN
0.5000 mg | Freq: Once | INTRAMUSCULAR | Status: AC
  Administered 2013-11-04: 0.5 mg via INTRAVENOUS
  Filled 2013-11-04: qty 1

## 2013-11-04 MED ORDER — HYDROCODONE-ACETAMINOPHEN 5-325 MG PO TABS: 2.0000 | ORAL_TABLET | Freq: Four times a day (QID) | ORAL | Status: DC | PRN

## 2013-11-04 NOTE — ED Provider Notes (Signed)
CSN: [id number]     Arrival date & time 11/04/13  1640 History   First MD Initiated Contact with Patient 11/04/13 1747     Chief Complaint  Patient presents with  . Rectal Pain  . Rectal Bleeding     (Consider location/radiation/quality/duration/timing/severity/associated sxs/prior Treatment) HPI Chronic severe abdominal pain chronic severe pelvic pain chronic narcotics chronic rectal pain recently fell with coccyx fracture with exacerbation of anal pain in her ongoing tailbone pain several days of intermittent bright red blood per rectum the patient with history of hemorrhoids in the past with no fever no shortness breath no chest pain no syncope no trauma since her recent coccyx fracture here for pain medications and recurrent bright red blood per rectum several episodes since last ED visit was otherwise loose brown stools since last ED visit. Past Medical History  Diagnosis Date  . Proctitis   . Cysts of both ovaries   . Seizures   . Anemia   . Anxiety   . Blood transfusion without reported diagnosis   . Depression   . Fatty liver 10/05/13   Past Surgical History  Procedure Laterality Date  . Ovarian cyst removal    . Laparoscopy N/A 09/28/2013    Procedure: LAPAROSCOPY OPERATIVE;  Surgeon: Terrance Mass, MD;  Location: [facility];  Service: Gynecology;  Laterality: N/A;  . Laparoscopic appendectomy Right 09/28/2013    Procedure: APPENDECTOMY LAPAROSCOPIC;  Surgeon: Terrance Mass, MD;  Location: [facility];  Service: Gynecology;  Laterality: Right;  . Salpingoophorectomy Right 09/28/2013    Procedure: SALPINGO OOPHORECTOMY;  Surgeon: Terrance Mass, MD;  Location: [facility];  Service: Gynecology;  Laterality: Right;  . Colonoscopy N/A 09/30/2013    Procedure: COLONOSCOPY;  Surgeon: Lafayette Dragon, MD;  Location: [facility];  Service: Endoscopy;  Laterality: N/A;   Family History  Problem Relation Age of Onset  . Diabetes Mother   . Hyperlipidemia Mother   . Stroke Mother   .  Diabetes Father    History  Substance Use Topics  . Smoking status: Never Smoker   . Smokeless tobacco: Not on file  . Alcohol Use: Yes   OB History   Grav Para Term Preterm Abortions TAB SAB Ect Mult Living   7 3   4  4   3      Review of Systems  10 Systems reviewed and are negative for acute change except as noted in the HPI.  Allergies  Morphine and related; Tramadol; and Penicillins  Home Medications   Prior to Admission medications   Medication Sig Start Date End Date Taking? Authorizing Provider  HYDROcodone-acetaminophen (NORCO/VICODIN) 5-325 MG per tablet Take 1 tablet by mouth every 6 (six) hours as needed for moderate pain.   Yes Historical Provider, MD  oxyCODONE-acetaminophen (PERCOCET/ROXICET) 5-325 MG per tablet Take 1 tablet by mouth every 4 (four) hours as needed for moderate pain or severe pain.   Yes Historical Provider, MD  hydrocortisone (ANUSOL-HC) 2.5 % rectal cream Apply rectally 2 times daily 11/02/13   Renaldo Reel, MD   BP 130/89  Pulse 80  Temp(Src) 97.8 F (36.6 C) (Oral)  Resp 18  SpO2 98%  LMP 09/25/2013 Physical Exam  Nursing note and vitals reviewed. Constitutional:  Awake, alert, nontoxic appearance.  HENT:  Head: Atraumatic.  Eyes: Right eye exhibits no discharge. Left eye exhibits no discharge.  Neck: Neck supple.  Cardiovascular: Normal rate and regular rhythm.   No murmur heard. Pulmonary/Chest: Effort normal and breath sounds normal.  No respiratory distress. She has no wheezes. She has no rales. She exhibits no tenderness.  Abdominal: Soft. Bowel sounds are normal. She exhibits no distension and no mass. There is tenderness. There is guarding. There is no rebound.  Mild diffuse abdominal tenderness without rebound  Genitourinary:  Chaperone present for anal examination and inspection reveals an external hemorrhoid without active bleeding patient has tenderness to lower sacrum and coccyx region without deformity noted rectal  examination tenderness noted without palpable coccyx deformity no bright red blood noted and stool is light brown  Musculoskeletal: She exhibits no tenderness.  Baseline ROM, no obvious new focal weakness.  Neurological:  Mental status and motor strength appears baseline for patient and situation.  Skin: No rash noted.  Psychiatric: She has a normal mood and affect.    ED Course  Procedures (including critical care time) Patient / Family / Caregiver informed of clinical course, understand medical decision-making process, and agree with plan. Labs Review Labs Reviewed  CBC WITH DIFFERENTIAL - Abnormal; Notable for the following:    WBC 2.1 (*)    RBC 2.99 (*)    Hemoglobin 9.4 (*)    HCT 28.1 (*)    RDW 17.5 (*)    Neutrophils Relative % 42 (*)    Lymphocytes Relative 47 (*)    Neutro Abs 0.9 (*)    All other components within normal limits  COMPREHENSIVE METABOLIC PANEL - Abnormal; Notable for the following:    Glucose, Bld 109 (*)    Creatinine, Ser 0.46 (*)    Calcium 7.8 (*)    Total Protein 5.8 (*)    Albumin 3.3 (*)    AST 137 (*)    All other components within normal limits  LIPASE, BLOOD    Imaging Review No results found.   EKG Interpretation None      MDM   Final diagnoses:  Fracture of coccyx  Chronic abdominal pain  Chronic pelvic pain in female    I doubt any other EMC precluding discharge at this time including, but not necessarily limited to the following:SBI, peritonitis.    Babette Relic, MD 11/09/13 [phone]

## 2013-11-04 NOTE — Discharge Instructions (Signed)
Abdominal (belly) pain can be caused by many things. Your caregiver performed an examination and possibly ordered blood/urine tests and imaging (CT scan, x-rays, ultrasound). Many cases can be observed and treated at home after initial evaluation in the emergency department. Even though you are being discharged home, abdominal pain can be unpredictable. Therefore, you need a repeated exam if your pain does not resolve, returns, or worsens. Most patients with abdominal pain don't have to be admitted to the hospital or have surgery, but serious problems like appendicitis and gallbladder attacks can start out as nonspecific pain. Many abdominal conditions cannot be diagnosed in one visit, so follow-up evaluations are very important. °SEEK IMMEDIATE MEDICAL ATTENTION IF: °The pain does not go away or becomes severe.  °A temperature above 101 develops.  °Repeated vomiting occurs (multiple episodes).  °The pain becomes localized to portions of the abdomen. The right side could possibly be appendicitis. In an adult, the left lower portion of the abdomen could be colitis or diverticulitis.  °Blood is being passed in stools or vomit (bright red or black tarry stools).  °Return also if you develop chest pain, difficulty breathing, dizziness or fainting, or become confused, poorly responsive, or inconsolable (young children). ° °Chronic Pain Discharge Instructions  °Emergency care providers appreciate that many patients coming to us are in severe pain and we wish to address their pain in the safest, most responsible manner.  It is important to recognize however, that the proper treatment of chronic pain differs from that of the pain of injuries and acute illnesses.  Our goal is to provide quality, safe, personalized care and we thank you for giving us the opportunity to serve you. °The use of narcotics and related agents for chronic pain syndromes may lead to additional physical and psychological problems.  Nearly as many  people die from prescription narcotics each year as die from car crashes.  Additionally, this risk is increased if such prescriptions are obtained from a variety of sources.  Therefore, only your primary care physician or a pain management specialist is able to safely treat such syndromes with narcotic medications long-term.   ° °Documentation revealing such prescriptions have been sought from multiple sources may prohibit us from providing a refill or different narcotic medication.  Your name may be checked first through the [place] Controlled Substances Reporting System.  This database is a record of controlled substance medication prescriptions that the patient has received.  This has been established by [place] in an effort to eliminate the dangerous, and often life threatening, practice of obtaining multiple prescriptions from different medical providers.  ° °If you have a chronic pain syndrome (i.e. chronic headaches, recurrent back or neck pain, dental pain, abdominal or pelvis pain without a specific diagnosis, or neuropathic pain such as fibromyalgia) or recurrent visits for the same condition without an acute diagnosis, you may be treated with non-narcotics and other non-addictive medicines.  Allergic reactions or negative side effects that may be reported by a patient to such medications will not typically lead to the use of a narcotic analgesic or other controlled substance as an alternative. °  °Patients managing chronic pain with a personal physician should have provisions in place for breakthrough pain.  If you are in crisis, you should call your physician.  If your physician directs you to the emergency department, please have the doctor call and speak to our attending physician concerning your care. °  °When patients come to the Emergency Department (ED) with acute   medical conditions in which the Emergency Department physician feels appropriate to prescribe narcotic or sedating pain  medication, the physician will prescribe these in very limited quantities.  The amount of these medications will last only until you can see your primary care physician in his/her office.  Any patient who returns to the ED seeking refills should expect only non-narcotic pain medications.   In the event of an acute medical condition exists and the emergency physician feels it is necessary that the patient be given a narcotic or sedating medication -  a responsible adult driver should be present in the room prior to the medication being given by the nurse.   Prescriptions for narcotic or sedating medications that have been lost, stolen or expired will not be refilled in the Emergency Department.    Patients who have chronic pain may receive non-narcotic prescriptions until seen by their primary care physician.  It is every patients personal responsibility to maintain active prescriptions with his or her primary care physician or specialist.  Emergency Department Resource Guide 1) Find a Doctor and Pay Out of Pocket Although you won't have to find out who is covered by your insurance plan, it is a good idea to ask around and get recommendations. You will then need to call the office and see if the doctor you have chosen will accept you as a new patient and what types of options they offer for patients who are self-pay. Some doctors offer discounts or will set up payment plans for their patients who do not have insurance, but you will need to ask so you aren't surprised when you get to your appointment.  2) Contact Your Local Health Department Not all health departments have doctors that can see patients for sick visits, but many do, so it is worth a call to see if yours does. If you don't know where your local health department is, you can check in your phone book. The CDC also has a tool to help you locate your state's health department, and many state websites also have listings of all of their local  health departments.  3) Find a [facility] If your illness is not likely to be very severe or complicated, you may want to try a walk in clinic. These are popping up all over the country in pharmacies, drugstores, and shopping centers. They're usually staffed by nurse practitioners or physician assistants that have been trained to treat common illnesses and complaints. They're usually fairly quick and inexpensive. However, if you have serious medical issues or chronic medical problems, these are probably not your best option.  No Primary Care Doctor: - Call Health Connect at  [phone] - they can help you locate a primary care doctor that  accepts your insurance, provides certain services, etc. - Physician Referral Service- [phone]  Chronic Pain Problems: Organization         Address  Phone   Notes  [facility]  [phone] Patients need to be referred by their primary care doctor.   Medication Assistance: Organization         Address  Phone   Notes  [facility] Medication [facility] [address] [place], [place] [address] [phone] --Must be a resident of [facility] -- Must have NO insurance coverage whatsoever (no Medicaid/ Medicare, etc.) -- The pt. MUST have a primary care doctor that directs their care regularly and follows them in the community   MedAssist  (866)  [place]  [phone]    Agencies that provide inexpensive medical care: Organization         Address  Phone   Notes  [place]  [phone]   [facility]    [phone]   [facility] [place], [place] [address] [phone]   [place] [address], [place] [phone]   Planned Parenthood    [phone]   [facility]    [phone]   [place] and [place], [place] Phone:   [phone], Fax:  [phone] Hours of Operation:  9 am - 6 pm, M-F.  Also accepts Medicaid/Medicare and self-pay.  [facility] for [place], [address], [place] Phone: [phone], Fax: [phone]. Hours of Operation:  8:30 am - 5:30 pm, M-F.  Also accepts Medicaid and self-pay.  [facility] [place] [address], [place] Phone: [phone]   [place], [place], [place] [phone], Ext. [phone] Mondays & Thursdays: 7-9 AM.  First 15 patients are seen on a first come, first serve basis.    [place] Providers:  Organization         Address  Phone   Notes  [facility] [address], [place] [employer] [phone] Also accepts self-pay patients.  [facility] [address], [place]  [phone]   [place], [address], [place] [phone]   [facility] [address] [place] [phone]   Lucianne Lei [address], [place]   [phone] Only accepts [place] Access [place] patients after they have their name applied to their card.   Self-Pay (no insurance) in [facility]:  Organization         Address  Phone   Notes  Sickle Cell Patients, [facility] Internal Medicine [place] [phone]   [facility] [place] [phone]   [facility] [place]  [place], [place], [place] [phone]   [facility]/Dr. Osei-Bonsu  [address], [place] or [address], [place] [phone] Phone number for both [place] and [place] locations is the same.  Urgent Medical and [facility] [address], [place] [phone]   [facility] [address], [place] or [address] [phone]   [facility] [address] [place] [phone], phone; [phone], fax Sees patients 1st and 3rd Saturday of every month.  Must not qualify for public or private insurance (i.e. Medicaid, Medicare, [place] Health Choice, Veterans' Benefits)  Household income should be no more than 200% of the poverty level The clinic cannot treat you if you are pregnant or think you are pregnant  Sexually transmitted diseases are not treated at the clinic.   Dental Care: Organization         Address  Phone  Notes  [facility] Department of [facility] [place] [phone] Accepts children up to age 34 who are enrolled in [place] or [place]; pregnant women with a  Medicaid card; and children who have applied for Medicaid or [place] Health Choice, but were declined, whose parents can pay a reduced fee at time of service.  [facility] Department of [facility]  [address], [place] [phone] Accepts children up to age 43 who are enrolled in [place] or [place]; pregnant women with a Medicaid card; and children who have applied for Medicaid or [place] Health Choice, but were declined, whose parents can pay a reduced fee at time of service.  [facility]  [place] [phone] Patients are seen by appointment only. Walk-ins are not accepted. [place] will see patients 105 years of age and older. Monday - Tuesday (8am-5pm) Most Wednesdays (8:30-5pm) $30 per visit, cash only  [facility] Adult Dental Access PROGRAM  [address], [facility] [phone] Patients are seen by appointment only. Walk-ins are not accepted. [place] will see patients 27 years of age and older. One Wednesday Evening (Monthly: Volunteer Based).  $30 per visit, cash only  [place]  [phone] for adults;  Children under age 57, call Graduate Pediatric Dentistry at [phone]. Children aged 49-14, please call [phone] to request a pediatric application.  Dental services are provided in all areas of dental care including fillings, crowns and bridges, complete and partial dentures, implants, gum treatment, root canals, and extractions. Preventive care is also provided. Treatment is provided to both adults and children. Patients are selected via a lottery and there is often a waiting list.   [facility] [address], [place]  [phone] [email or web address]   [facility] [address] [place], [place] [phone], Ext. [phone] Second and Fourth Thursday of each month, opens at 6:30 AM; Clinic ends at 9 AM.  Patients are seen on a first-come first-served basis, and a limited number are seen during each clinic.   [facility], [place]  [address] Hillard Danker [place], [place] [phone]   Eligibility Requirements You must have lived in [place], [place], or [place] counties for at least the last three months.   You cannot be eligible for state or federal sponsored [employer], including [employer], [place], or [employer].   You generally cannot be eligible for healthcare insurance through your employer.    How to apply: Eligibility screenings are held every Tuesday and Wednesday afternoon from 1:00 pm until 4:00 pm. You do not need an appointment for the interview!  [facility] [address], [place], [place]   [place]  [place] Department  [place]  [phone]    Behavioral Health Resources in the Community: Intensive Outpatient Programs Organization         Address  Phone  Notes  [place]. [address], [place], [place] [phone]   [facility] Outpatient [address], [place], [place]   ADS: Alcohol & Drug Svcs [address] [place], [place]   [place] [address]  [place], [place] or [phone]   Substance Abuse Resources Organization         Address  Phone  Notes  Alcohol and Drug Services  [place]  [phone]   [facility] [place]   [facility]  [phone]  Residential & Outpatient Substance Abuse Program  [phone]   Psychological Services Organization         Address  Phone  Notes  [facility] [place]  [place]  [phone]   [place] [phone] [address] [place] or [phone]    Mobile Crisis Teams Organization         Address  Phone  Notes  [facility], Mobile Crisis Care Unit  [phone]   Assertive Psychotherapeutic Services  [address] [place], [place]   [place] Levels [address], [place] [phone]    Self-Help/Support Groups Organization         Address  Phone             Notes  [place]. of [place] - variety of support groups  [place] Call for more information  Narcotics Anonymous (NA), [facility] [address], [employer] [place]  2 meetings at this location   Special educational needs teacher         Address  Phone  Notes  ASAP Residential Treatment [place],    [place]  [phone]   [facility]  [address], [place] [phone], [place], [place]   [place], [place] [phone] Admissions: 8am-3pm M-F  Incentives Substance [place] [address]    [place], [place] [phone]   [facility] [address] [place], [place], [place]   [facility] [address]  [place], [place]   Insight Programs - Intensive  Outpatient [address], [place], [place]   [facility] ([place].) [place].,  [place], [place] [phone] or [phone]   Residential Treatment Services (RTS) [address] [place], [place] Accepts Medicaid  [facility] [place] [address],  [place] [phone] Substance Abuse/Addiction Treatment   [facility] Organization         Address  Phone  Notes  [facility]  [phone]   Domenic Schwab, PhD [address] Arlis Porta [place], [place]   [phone] or [phone]   [place] [address] [place], [place] [phone]   [facility] [address] [place], [place] [phone] Insurance/Medicaid/sponsorship through [facility] and Families [address]                                    [place], [place] [phone] [place] [address], [place] [phone]    Dr. Adele Schilder  [phone]   [facility] Dept. 1) [address] [place] 2) [place] 3)  [place] [address], [place] [phone]  [phone]   [place] [phone] or [phone] (After Hours)

## 2013-11-04 NOTE — ED Notes (Signed)
Pt to Ed via EMS with c/o rectal pain, rectal bleed, and nausea. Pt is tearful at arrival. Per EMS, BP-108/palpated, Pulse-108, O2-100% on room air. Pt was seen here recently for the same.

## 2013-11-07 ENCOUNTER — Emergency Department (HOSPITAL_BASED_OUTPATIENT_CLINIC_OR_DEPARTMENT_OTHER)
Admission: EM | Admit: 2013-11-07 | Discharge: 2013-11-07 | Disposition: A | Discharge status: Home / Self Care | Attending: Emergency Medicine | Admitting: Emergency Medicine

## 2013-11-07 ENCOUNTER — Encounter (HOSPITAL_BASED_OUTPATIENT_CLINIC_OR_DEPARTMENT_OTHER): Payer: Self-pay | Admitting: Emergency Medicine

## 2013-11-07 DIAGNOSIS — G8929 Other chronic pain: Secondary | ICD-10-CM

## 2013-11-07 DIAGNOSIS — R141 Gas pain: Secondary | ICD-10-CM | POA: Insufficient documentation

## 2013-11-07 DIAGNOSIS — M549 Dorsalgia, unspecified: Secondary | ICD-10-CM | POA: Insufficient documentation

## 2013-11-07 DIAGNOSIS — R112 Nausea with vomiting, unspecified: Secondary | ICD-10-CM | POA: Insufficient documentation

## 2013-11-07 DIAGNOSIS — R143 Flatulence: Secondary | ICD-10-CM

## 2013-11-07 DIAGNOSIS — R102 Pelvic and perineal pain: Secondary | ICD-10-CM

## 2013-11-07 DIAGNOSIS — Z862 Personal history of diseases of the blood and blood-forming organs and certain disorders involving the immune mechanism: Secondary | ICD-10-CM | POA: Insufficient documentation

## 2013-11-07 DIAGNOSIS — N949 Unspecified condition associated with female genital organs and menstrual cycle: Secondary | ICD-10-CM | POA: Insufficient documentation

## 2013-11-07 DIAGNOSIS — R142 Eructation: Secondary | ICD-10-CM

## 2013-11-07 DIAGNOSIS — R110 Nausea: Secondary | ICD-10-CM

## 2013-11-07 DIAGNOSIS — F411 Generalized anxiety disorder: Secondary | ICD-10-CM | POA: Insufficient documentation

## 2013-11-07 DIAGNOSIS — Z3202 Encounter for pregnancy test, result negative: Secondary | ICD-10-CM | POA: Insufficient documentation

## 2013-11-07 DIAGNOSIS — K625 Hemorrhage of anus and rectum: Secondary | ICD-10-CM | POA: Insufficient documentation

## 2013-11-07 DIAGNOSIS — Z880 Allergy status to penicillin: Secondary | ICD-10-CM | POA: Insufficient documentation

## 2013-11-07 DIAGNOSIS — R748 Abnormal levels of other serum enzymes: Secondary | ICD-10-CM | POA: Insufficient documentation

## 2013-11-07 LAB — CBC WITH DIFFERENTIAL/PLATELET
BASOS ABS: 0 10*3/uL (ref 0.0–0.1)
BASOS PCT: 1 % (ref 0–1)
EOS ABS: 0 10*3/uL (ref 0.0–0.7)
EOS PCT: 0 % (ref 0–5)
HEMATOCRIT: 31.3 % — AB (ref 36.0–46.0)
Hemoglobin: 10.7 g/dL — ABNORMAL LOW (ref 12.0–15.0)
Lymphocytes Relative: 27 % (ref 12–46)
Lymphs Abs: 0.7 10*3/uL (ref 0.7–4.0)
MCH: 31.7 pg (ref 26.0–34.0)
MCHC: 34.2 g/dL (ref 30.0–36.0)
MCV: 92.6 fL (ref 78.0–100.0)
Monocytes Absolute: 0.2 10*3/uL (ref 0.1–1.0)
Monocytes Relative: 7 % (ref 3–12)
Neutro Abs: 1.8 10*3/uL (ref 1.7–7.7)
Neutrophils Relative %: 65 % (ref 43–77)
PLATELETS: 162 10*3/uL (ref 150–400)
RBC: 3.38 MIL/uL — ABNORMAL LOW (ref 3.87–5.11)
RDW: 16 % — AB (ref 11.5–15.5)
WBC: 2.7 10*3/uL — ABNORMAL LOW (ref 4.0–10.5)

## 2013-11-07 LAB — COMPREHENSIVE METABOLIC PANEL
ALT: 29 U/L (ref 0–35)
AST: 129 U/L — AB (ref 0–37)
Albumin: 3.9 g/dL (ref 3.5–5.2)
Alkaline Phosphatase: 138 U/L — ABNORMAL HIGH (ref 39–117)
BUN: 6 mg/dL (ref 6–23)
CO2: 25 meq/L (ref 19–32)
CREATININE: 0.5 mg/dL (ref 0.50–1.10)
Calcium: 8.8 mg/dL (ref 8.4–10.5)
Chloride: 97 mEq/L (ref 96–112)
GFR calc Af Amer: >90 mL/min (ref >90)
Glucose, Bld: 125 mg/dL — ABNORMAL HIGH (ref 70–99)
Potassium: 3.4 mEq/L — ABNORMAL LOW (ref 3.7–5.3)
SODIUM: 139 meq/L (ref 137–147)
TOTAL PROTEIN: 6.8 g/dL (ref 6.0–8.3)
Total Bilirubin: 1.3 mg/dL — ABNORMAL HIGH (ref 0.3–1.2)

## 2013-11-07 LAB — URINALYSIS, ROUTINE W REFLEX MICROSCOPIC
Bilirubin Urine: NEGATIVE
Glucose, UA: NEGATIVE mg/dL
Ketones, ur: NEGATIVE mg/dL
NITRITE: NEGATIVE
PH: 6 (ref 5.0–8.0)
Protein, ur: NEGATIVE mg/dL
Specific Gravity, Urine: 1.016 (ref 1.005–1.030)
UROBILINOGEN UA: 0.2 mg/dL (ref 0.0–1.0)

## 2013-11-07 LAB — PREGNANCY, URINE: PREG TEST UR: NEGATIVE

## 2013-11-07 LAB — URINE MICROSCOPIC-ADD ON

## 2013-11-07 MED ORDER — PROMETHAZINE HCL 25 MG/ML IJ SOLN
25.0000 mg | Freq: Once | INTRAMUSCULAR | Status: AC
  Administered 2013-11-07: 25 mg via INTRAVENOUS
  Filled 2013-11-07: qty 1

## 2013-11-07 MED ORDER — METHOCARBAMOL 100 MG/ML IJ SOLN
1000.0000 mg | Freq: Once | INTRAMUSCULAR | Status: AC
  Administered 2013-11-07: 1000 mg via INTRAMUSCULAR
  Filled 2013-11-07: qty 10

## 2013-11-07 MED ORDER — POTASSIUM CHLORIDE CRYS ER 20 MEQ PO TBCR
40.0000 meq | EXTENDED_RELEASE_TABLET | Freq: Once | ORAL | Status: AC
Start: 2013-11-07 — End: 2013-11-07
  Administered 2013-11-07: 40 meq via ORAL
  Filled 2013-11-07: qty 2

## 2013-11-07 MED ORDER — KETOROLAC TROMETHAMINE 30 MG/ML IJ SOLN
30.0000 mg | Freq: Once | INTRAMUSCULAR | Status: AC
  Administered 2013-11-07: 30 mg via INTRAVENOUS
  Filled 2013-11-07: qty 1

## 2013-11-07 MED ORDER — SODIUM CHLORIDE 0.9 % IV BOLUS (SEPSIS)
1000.0000 mL | Freq: Once | INTRAVENOUS | Status: AC
  Administered 2013-11-07: 1000 mL via INTRAVENOUS

## 2013-11-07 NOTE — ED Provider Notes (Signed)
CSN: [id number]     Arrival date & time 11/07/13  1652 History   First MD Initiated Contact with Patient 11/07/13 1704     Chief Complaint  Patient presents with  . Emesis     (Consider location/radiation/quality/duration/timing/severity/associated sxs/prior Treatment) HPI  -38year-old female with a history of chronic abdominal pain, pelvic pain, proctitis, hemorrhoids, and recently diagnosed coccyx fracture presents today complaining of nausea. She states that she vomits up her nausea medicine. She states that she has pain throughout her abdomen and pelvis. She has not vomited today. She states she feels like she is generally weak like she does when her potassium is low. She has recently moved here from out of town. She states that she has not established primary care here. She states these symptoms have gone on for an extended period of time and she has not being treated by anybody specifically. Review of her records show that she has been seen in the ED 10 times in the past 6-1/2 weeks. She reports that she has pain in her rectum with stooling. She has bleeding hemorrhoids it has been told these needed to be removed.  She has been admitted and had a colonoscopy for rectal bleeding under the tongue she has been in our system. The colonoscopy was relatively normal and the bleeding was felt to come from hemorrhoids. She has also had an oophorectomy for children in town which had helped with her pelvic pain. She has not had any fever, chills, diarrhea. She states that she has not been taking by mouth well but his not think that she has lost weight.  Past Medical History  Diagnosis Date  . Proctitis   . Cysts of both ovaries   . Seizures   . Anemia   . Anxiety   . Blood transfusion without reported diagnosis   . Depression   . Fatty liver 10/05/13   Past Surgical History  Procedure Laterality Date  . Ovarian cyst removal    . Laparoscopy N/A 09/28/2013    Procedure: LAPAROSCOPY OPERATIVE;   Surgeon: Terrance Mass, MD;  Location: [facility];  Service: Gynecology;  Laterality: N/A;  . Laparoscopic appendectomy Right 09/28/2013    Procedure: APPENDECTOMY LAPAROSCOPIC;  Surgeon: Terrance Mass, MD;  Location: [facility];  Service: Gynecology;  Laterality: Right;  . Salpingoophorectomy Right 09/28/2013    Procedure: SALPINGO OOPHORECTOMY;  Surgeon: Terrance Mass, MD;  Location: [facility];  Service: Gynecology;  Laterality: Right;  . Colonoscopy N/A 09/30/2013    Procedure: COLONOSCOPY;  Surgeon: Lafayette Dragon, MD;  Location[facility];  Service: Endoscopy;  Laterality: N/A;   Family History  Problem Relation Age of Onset  . Diabetes Mother   . Hyperlipidemia Mother   . Stroke Mother   . Diabetes Father    History  Substance Use Topics  . Smoking status: Never Smoker   . Smokeless tobacco: Not on file  . Alcohol Use: Yes   OB History   Grav Para Term Preterm Abortions TAB SAB Ect Mult Living   7 3   4  4   3      Review of Systems  Constitutional: Positive for activity change.  HENT: Negative.   Eyes: Negative.   Respiratory: Negative.   Cardiovascular: Negative.   Gastrointestinal: Positive for abdominal distention and anal bleeding.  Endocrine: Negative.   Genitourinary: Positive for dysuria and difficulty urinating.  Musculoskeletal: Positive for arthralgias and back pain.  Skin: Negative.   Allergic/Immunologic: Negative.  Neurological: Negative.   Hematological: Negative.   Psychiatric/Behavioral: Negative.       Allergies  Morphine and related; Tramadol; and Penicillins  Home Medications   Prior to Admission medications   Medication Sig Start Date End Date Taking? Authorizing Provider  HYDROcodone-acetaminophen (NORCO) 5-325 MG per tablet Take 2 tablets by mouth every 6 (six) hours as needed for severe pain. 11/04/13   Babette Relic, MD  HYDROcodone-acetaminophen (NORCO/VICODIN) 5-325 MG per tablet Take 1 tablet by mouth every 6 (six) hours as needed for  moderate pain.    Historical Provider, MD  hydrocortisone (ANUSOL-HC) 2.5 % rectal cream Apply rectally 2 times daily 11/02/13   Renaldo Reel, MD  oxyCODONE-acetaminophen (PERCOCET/ROXICET) 5-325 MG per tablet Take 1 tablet by mouth every 4 (four) hours as needed for moderate pain or severe pain.    Historical Provider, MD   BP 105/68  Pulse 90  Temp(Src) 98.4 F (36.9 C) (Oral)  Resp 16  Ht 5\' 6"  (1.676 m)  SpO2 100%  LMP 09/25/2013 Physical Exam  Nursing note and vitals reviewed. Constitutional: She is oriented to person, place, and time. She appears well-developed and well-nourished.  HENT:  Head: Normocephalic and atraumatic.  Right Ear: External ear normal.  Left Ear: External ear normal.  Nose: Nose normal.  Mouth/Throat: Oropharynx is clear and moist.  Eyes: Conjunctivae and EOM are normal. Pupils are equal, round, and reactive to light.  Neck: Normal range of motion. Neck supple.  Cardiovascular: Normal rate, regular rhythm, normal heart sounds and intact distal pulses.   Pulmonary/Chest: Effort normal and breath sounds normal.  Abdominal: Soft. Bowel sounds are normal.  Musculoskeletal: Normal range of motion.  Neurological: She is alert and oriented to person, place, and time. She has normal reflexes.  Skin: Skin is warm and dry.  Psychiatric: Her speech is normal. Judgment normal. Her mood appears anxious. Her affect is angry and labile. Cognition and memory are normal. She expresses no homicidal and no suicidal ideation. She expresses no suicidal plans and no homicidal plans.  Patient is tearful.  When asked if depressed states, "Wouldn't you be?"      ED Course  Procedures (including critical care time) Labs Review Labs Reviewed  CBC WITH DIFFERENTIAL - Abnormal; Notable for the following:    WBC 2.7 (*)    RBC 3.38 (*)    Hemoglobin 10.7 (*)    HCT 31.3 (*)    RDW 16.0 (*)    All other components within normal limits  COMPREHENSIVE METABOLIC PANEL - Abnormal;  Notable for the following:    Potassium 3.4 (*)    Glucose, Bld 125 (*)    AST 129 (*)    Alkaline Phosphatase 138 (*)    Total Bilirubin 1.3 (*)    All other components within normal limits  URINALYSIS, ROUTINE W REFLEX MICROSCOPIC  PREGNANCY, URINE    Imaging Review No results found.   EKG Interpretation None      MDM   Final diagnoses:  Nausea  Chronic pelvic pain in female  Elevated liver enzymes    Patient presents today with ongoing abdominal pain, rectal pain, pelvic pain, and sacral pain. She states she has been nauseated but is unclear she's been taking her anti-emetics. She is given IV hydration here and orally repleted with potassium. Her hemoglobin is stable and in fact it improved from the last one that was checked which was 9. Her vital signs appeared normal here.  Alkaline phosphatase is mildly elevated as  is her AST which been elevated in many of her previous lab workups. She has GI followup and is advised to avoid any liver toxins and have these rechecked. She's advised to specifically avoid acetaminophen. I discussed with her that she needs to obtain outpatient followup.   Shaune Pollack, MD 11/08/13 [phone]

## 2013-11-07 NOTE — Discharge Instructions (Signed)
°Emergency Department Resource Guide °1) Find a Doctor and Pay Out of Pocket °Although you won't have to find out who is covered by your insurance plan, it is a good idea to ask around and get recommendations. You will then need to call the office and see if the doctor you have chosen will accept you as a new patient and what types of options they offer for patients who are self-pay. Some doctors offer discounts or will set up payment plans for their patients who do not have insurance, but you will need to ask so you aren't surprised when you get to your appointment. ° °2) Contact Your Local Health Department °Not all health departments have doctors that can see patients for sick visits, but many do, so it is worth a call to see if yours does. If you don't know where your local health department is, you can check in your phone book. The CDC also has a tool to help you locate your state's health department, and many state websites also have listings of all of their local health departments. ° °3) Find a Walk-in Clinic °If your illness is not likely to be very severe or complicated, you may want to try a walk in clinic. These are popping up all over the country in pharmacies, drugstores, and shopping centers. They're usually staffed by nurse practitioners or physician assistants that have been trained to treat common illnesses and complaints. They're usually fairly quick and inexpensive. However, if you have serious medical issues or chronic medical problems, these are probably not your best option. ° °No Primary Care Doctor: °- Call [facility] at  832-8000 - they can help you locate a primary care doctor that  accepts your insurance, provides certain services, etc. °- Physician Referral Service- 1-800-533-3463 ° °Chronic Pain Problems: °Organization         Address  Phone   Notes  °[facility]  (336) 297-2271 Patients need to be referred by their primary care doctor.  ° °Medication  Assistance: °Organization         Address  Phone   Notes  °[place] [facility] [address] °[place], [place] [address] (336) 641-8030 --Must be a resident of [place] °-- Must have NO insurance coverage whatsoever (no Medicaid/ Medicare, etc.) °-- The pt. MUST have a primary care doctor that directs their care regularly and follows them in the community °  °[facility]  (866) 331-1348   °[employer]  (888) 892-1162   ° °Agencies that provide inexpensive medical care: °Organization         Address  Phone   Notes  °[facility]  (336) 832-8035   °[facility]    (336) 832-7272   °[facility] [address] °[place], [place] [address] (336) 832-4777   °[facility] [address], °[place] (336) 271-4999   °[facility]    (336) 373-0678   °[facility]    (336) 272-1050   °[facility] ° [address], [place] Phone:  (336) 832-4444, Fax:  (336) 832-4440 Hours of Operation:  9 am - 6 pm, M-F.  Also accepts Medicaid/Medicare and self-pay.  °[facility] ° [address], [place] Phone: (336) 832-3150, Fax: (336) [phone]. Hours of Operation:  8:30 am - 5:30 pm, M-F.  Also accepts Medicaid and self-pay.  °[facility] [place] [address], [place] Phone: (336) 878-6027   °[facility] [address], [place], [place] (336)723-1848, Ext. 123 Mondays & Thursdays: 7-9 AM.  First 15 patients are seen on a first come, first serve basis. °  ° °Medicaid-accepting [place] Providers: ° °Organization         Address  Phone   Notes  °[facility] [address], [place] (336) 641-2100 Also accepts self-pay patients.  °[facility] [address], [place] ° (336) 856-9996   °[facility] [address], [place]  (336) 288-8857   °[facility] [address] [place] [address], [place] (336) 299-7000   °Veita Bland [address], [place]  ° (336) 373-1557 Only accepts  Access Medicaid patients after they have their name applied to their card.  ° °Self-Pay (no insurance) in [place]: ° °Organization         Address  Phone   Notes  °Sickle Cell Patients, [facility] [address], [place] (336) 832-1970   °[facility] [address], [place] (336) 832-4400   °[facility] ° [address], [place] (336) 992-4800   °[facility]/Dr. Osei-Bonsu ° [address] [place] [address], [place] or [address], [place] (336) [phone] Phone number for both [place] and [place] locations is the same.  °[facility] [address], [place] (336) 299-0000   °[facility] [address] [place] [address], [place] or [address] (336) 852-7530 °(336) 878-2260   °[facility] [address], [place] (336) 350-1642, phone; (336) 294-5005, fax Sees patients 1st and 3rd Saturday of every month.  Must not qualify for public or private insurance (i.e. Medicaid, Medicare, [place] Health Choice, Veterans' Benefits) • Household income should be no more than 200% of the poverty level •The clinic cannot treat you if you are pregnant or think you are pregnant • Sexually transmitted diseases are not treated at the clinic.  ° ° °Dental Care: °Organization         Address  Phone  Notes  °[place] [facility] [address], [place] (336) 641-6152 Accepts children up to age 21 who are enrolled in Medicaid or [place] Health Choice; pregnant women with a Medicaid card; and children who have applied for Medicaid or [place] Health Choice, but were declined, whose parents can pay a reduced fee at time of service.  °[place]  [facility] [place]  [address], [place] (336) 641-7733 Accepts children up to age 21 who are enrolled in Medicaid or  Health Choice; pregnant women with a Medicaid card; and children who have applied for Medicaid or  Health Choice, but were declined, whose parents can pay a reduced fee at time of service.  °[facility] ° [address], [place] (336) 641-4533 Patients are seen by appointment only. Walk-ins are not accepted. [facility] will see patients 18 years of age and older. °Monday - Tuesday (8am-5pm) °Most Wednesdays (8:30-5pm) °$30 per visit, cash only  °[facility] ° [address], [place] (336) 641-4533 Patients are seen by appointment only. Walk-ins are not accepted. [facility] will see patients 18 years of age and older. °One   Wednesday Evening (Monthly: Volunteer Based).  $30 per visit, cash only  °[facility]  (919) 537-3737 for adults; Children under age 4, call Graduate Pediatric Dentistry at ([phone]) [phone]. Children aged 4-14, please call ([phone]) [phone] to request a pediatric application. ° Dental services are provided in all areas of dental care including fillings, crowns and bridges, complete and partial dentures, implants, gum treatment, root canals, and extractions. Preventive care is also provided. Treatment is provided to both adults and children. °Patients are selected via a lottery and there is often a waiting list. °  °[facility] [address], °[place] ° (336) 763-8833 www.drcivils.[email or web address] °  °[facility] [address], [place], [place] (336)723-1848, Ext. 123 Second and Fourth Thursday of each month, opens at 6:30 AM; Clinic ends at 9 AM.  Patients are seen on a first-come first-served basis, and a limited number are seen during each clinic.  ° °[facility] ° [address], [place], [place] (336) 723-7904    Eligibility Requirements °You must have lived in [place], [place], or [place] counties for at least the last three months. °  You cannot be eligible for state or federal sponsored healthcare insurance, including Veterans Administration, Medicaid, or Medicare. °  You generally cannot be eligible for healthcare insurance through your employer.  °  How to apply: °Eligibility screenings are held every Tuesday and Wednesday afternoon from 1:00 pm until 4:00 pm. You do not need an appointment for the interview!  °[facility] [address], [place], [place] 336-631-2330   °[facility]  336-342-8273   °[facility]  336-703-3100   °[facility]  336-570-6415   ° °Behavioral Health Resources in the Community: °Intensive Outpatient Programs °Organization         Address  Phone  Notes  °[facility] [address], [place], [place] 336-878-6098   °[facility] [address], [place], [place] 336-832-9800   °[facility] [address], [place], [place] ° 336-882-2125   °[facility] [address],  °[place], [place] 1-800-853-5163 or 336-641-4981   °Substance Abuse Resources °Organization         Address  Phone  Notes  °[facility]  336-882-2125   °[facility]  336-784-9470   °[facility]  336-285-9073   °[facility]  336-845-3988   °Residential & Outpatient Substance Abuse Program  1-800-659-3381   °Psychological Services °Organization         Address  Phone  Notes  °[facility]  336- 832-9600   °[facility]  336- 378-7881   °[facility] [address], [place] 1-800-853-5163 or 336-641-4981   ° °Mobile Crisis Teams °Organization         Address  Phone  Notes  °[facility], Mobile Crisis Care Unit  1-877-626-1772   °Assertive °[facility] ° [address]  [place], [place] 336-834-9664   °Sharon DeEsch [address] °[place] 336-554-5454   ° °Self-Help/Support Groups °Organization         Address  Phone             Notes  °[facility] [place] - variety of support groups  336- 373-1402 Call for more information  °Narcotics Anonymous (NA), [facility] [address], °[place]  2 meetings at this location  ° °  Residential Treatment Programs Organization         Address  Phone  Notes  [facility] [address]    [place]  1-(816)093-2541   [facility]  [address], [place] [address], [place], [place]   [facility] [place], [place] [phone] Admissions: 8am-3pm M-F  [facility] [place] [address]    [place], [place] 967-893-8101   [facility] [address] [place], [place], [place]   [facility] [address],  [place], [place]   [facility] Intensive Outpatient [address], [place], [place]   [facility] [address]  [place], [place] 1-309-086-4830 or 2502952500   [facility] [address], [place], [place] Accepts Medicaid  [facility] [address],  [place] 1-470-220-5558 Substance Abuse/Addiction Treatment   [facility]         Address  Phone  Notes  [facility]  206-641-6990   Domenic Schwab, PhD [address] [place], [place]   450-685-9374 or (610)798-4607   [facility] [place], [place] (226)638-8817   [facility] [address], [place], [place] 806-582-2264 Insurance/Medicaid/sponsorship through [facility] [address]                                    [place], [place] [phone] [place] [address], [place] [phone]    Dr. Adele Schilder  [phone]   [facility] Dept. 1) [address] [place] 2) [place] 3)  [address], [place] [phone]  [phone]   [place] [phone] or 8181055949 (After Hours)      Chronic Pain Chronic pain can be defined as pain that is off and on and lasts for 3 6 months or longer. Many things cause chronic pain, which can make it difficult to make a diagnosis. There are many treatment options available for chronic pain. However, finding a treatment that works well for you may require trying various approaches until the right one is found. Many people benefit from a combination of two or more types of treatment to control their pain. SYMPTOMS  Chronic pain can occur anywhere in the body and can range from mild to very severe. Some types of chronic pain include:  Headache.  Low back pain.  Cancer pain.  Arthritis pain.  Neurogenic pain. This is pain resulting from damage to nerves. People with chronic pain may also have other symptoms such as:  Depression.  Anger.  Insomnia.  Anxiety. DIAGNOSIS  Your health care provider will help diagnose your condition over time. In many cases, the initial focus will be on excluding possible conditions that could be causing the pain. Depending on your symptoms, your health care provider may order tests to diagnose your condition. Some of these tests may include:   Blood tests.   CT scan.   MRI.   X-rays.   Ultrasounds.   Nerve conduction studies.  You may need to see a specialist.  TREATMENT  Finding treatment that works well may take time. You may be referred to a pain specialist. He or she may prescribe medicine or therapies, such as:   Mindful meditation or yoga.  Shots (injections) of numbing or  pain-relieving medicines into the spine or area of pain.  Local electrical stimulation.  Acupuncture.   Massage therapy.   Aroma, color, light, or sound therapy.   Biofeedback.   Working with a physical therapist to keep from getting stiff.   Regular, gentle exercise.   Cognitive or behavioral therapy.   Group support.  Sometimes, surgery may be recommended.  HOME CARE INSTRUCTIONS   Take all medicines as directed by your health care provider.   Lessen stress in your life by relaxing and doing things such as listening to calming music.   Exercise or be active as directed by your health care provider.   Eat a healthy diet and include things such as vegetables, fruits, fish, and lean meats in your diet.   Keep all follow-up appointments with your health care provider.   Attend a support group with others suffering from chronic pain. SEEK MEDICAL CARE IF:   Your pain gets worse.   You develop a new pain that was not there before.   You cannot tolerate medicines given to you by your health care provider.   You have new symptoms since your last visit with your health care provider.  SEEK IMMEDIATE MEDICAL CARE IF:   You feel weak.   You have decreased sensation or numbness.   You lose control of bowel or bladder function.   Your pain suddenly gets much worse.   You develop shaking.  You develop chills.  You develop confusion.  You develop chest pain.  You develop shortness of breath.  MAKE SURE YOU:  Understand these instructions.  Will watch your condition.  Will get help right away if you are not doing well or get worse. Document Released: 03/28/2002 Document Revised: 03/08/2013 Document Reviewed: 12/30/2012 [facility] Patient Information 2014 [place].

## 2013-11-07 NOTE — ED Notes (Signed)
Pt on all fours in room c/o rectal pain.  States pain causes nausea.  States unable to void until pain is relieved. MD aware

## 2013-11-07 NOTE — ED Notes (Addendum)
Pt requesting pain med.  Dr. Jeanell Sparrow notified.  No order received.

## 2013-11-07 NOTE — ED Notes (Signed)
Pt sts she cannot get a urine specimen because it "hurts too much."

## 2013-11-11 ENCOUNTER — Encounter (HOSPITAL_COMMUNITY): Payer: Self-pay | Admitting: Emergency Medicine

## 2013-11-11 ENCOUNTER — Emergency Department (HOSPITAL_COMMUNITY)
Admission: EM | Admit: 2013-11-11 | Discharge: 2013-11-11 | Disposition: A | Discharge status: Home / Self Care | Attending: Emergency Medicine | Admitting: Emergency Medicine

## 2013-11-11 DIAGNOSIS — R5383 Other fatigue: Secondary | ICD-10-CM

## 2013-11-11 DIAGNOSIS — Z880 Allergy status to penicillin: Secondary | ICD-10-CM | POA: Insufficient documentation

## 2013-11-11 DIAGNOSIS — G8929 Other chronic pain: Secondary | ICD-10-CM | POA: Insufficient documentation

## 2013-11-11 DIAGNOSIS — K649 Unspecified hemorrhoids: Secondary | ICD-10-CM

## 2013-11-11 DIAGNOSIS — Z862 Personal history of diseases of the blood and blood-forming organs and certain disorders involving the immune mechanism: Secondary | ICD-10-CM | POA: Insufficient documentation

## 2013-11-11 DIAGNOSIS — R197 Diarrhea, unspecified: Secondary | ICD-10-CM | POA: Insufficient documentation

## 2013-11-11 DIAGNOSIS — Z8742 Personal history of other diseases of the female genital tract: Secondary | ICD-10-CM | POA: Insufficient documentation

## 2013-11-11 DIAGNOSIS — IMO0002 Reserved for concepts with insufficient information to code with codable children: Secondary | ICD-10-CM | POA: Insufficient documentation

## 2013-11-11 DIAGNOSIS — R5381 Other malaise: Secondary | ICD-10-CM | POA: Insufficient documentation

## 2013-11-11 DIAGNOSIS — Z8669 Personal history of other diseases of the nervous system and sense organs: Secondary | ICD-10-CM | POA: Insufficient documentation

## 2013-11-11 DIAGNOSIS — Z8659 Personal history of other mental and behavioral disorders: Secondary | ICD-10-CM | POA: Insufficient documentation

## 2013-11-11 DIAGNOSIS — R112 Nausea with vomiting, unspecified: Secondary | ICD-10-CM | POA: Insufficient documentation

## 2013-11-11 DIAGNOSIS — K644 Residual hemorrhoidal skin tags: Secondary | ICD-10-CM | POA: Insufficient documentation

## 2013-11-11 DIAGNOSIS — Z8719 Personal history of other diseases of the digestive system: Secondary | ICD-10-CM | POA: Insufficient documentation

## 2013-11-11 LAB — I-STAT CHEM 8, ED
CALCIUM ION: 1.04 mmol/L — AB (ref 1.12–1.23)
CHLORIDE: 99 meq/L (ref 96–112)
Creatinine, Ser: 1.1 mg/dL (ref 0.50–1.10)
GLUCOSE: 127 mg/dL — AB (ref 70–99)
HCT: 30 % — ABNORMAL LOW (ref 36.0–46.0)
Hemoglobin: 10.2 g/dL — ABNORMAL LOW (ref 12.0–15.0)
Potassium: 2.9 mEq/L — CL (ref 3.7–5.3)
Sodium: 141 mEq/L (ref 137–147)
TCO2: 26 mmol/L (ref 0–100)

## 2013-11-11 MED ORDER — NAPROXEN 500 MG PO TABS: 500.0000 mg | ORAL_TABLET | Freq: Two times a day (BID) | ORAL | Status: DC

## 2013-11-11 MED ORDER — POTASSIUM CHLORIDE ER 10 MEQ PO TBCR: 10.0000 meq | EXTENDED_RELEASE_TABLET | Freq: Every day | ORAL | Status: DC

## 2013-11-11 MED ORDER — POTASSIUM CHLORIDE CRYS ER 20 MEQ PO TBCR
40.0000 meq | EXTENDED_RELEASE_TABLET | Freq: Once | ORAL | Status: AC
  Administered 2013-11-11: 40 meq via ORAL
  Filled 2013-11-11: qty 2

## 2013-11-11 MED ORDER — KETOROLAC TROMETHAMINE 60 MG/2ML IM SOLN
60.0000 mg | Freq: Once | INTRAMUSCULAR | Status: AC
  Administered 2013-11-11: 60 mg via INTRAMUSCULAR
  Filled 2013-11-11: qty 2

## 2013-11-11 NOTE — ED Notes (Signed)
Pt states understanding of discharge instructions 

## 2013-11-11 NOTE — ED Provider Notes (Signed)
CSN: [id number]     Arrival date & time 11/11/13  2138 History   First MD Initiated Contact with Patient 11/11/13 2159     Chief Complaint  Patient presents with  . Abdominal Pain     (Consider location/radiation/quality/duration/timing/severity/associated sxs/prior Treatment) The history is provided by the patient and medical records.   history of present illness: 38 year old female who presents with chief complaints of abdominal cramping, nausea, vomiting, diarrhea, and hemorrhoids. Onset of symptoms has been over the course of the past 2 months. She's been seen in the emergency department multiple times for these complaints. Patient has not followed up as an outpatient yet because she states "I'm just too tired and weak to go to my appointments". She says that the pain medication she's been previously prescribed only makes her more sick to her stomach. She states that she has been unable to eat. There has been no worsening of symptoms. No history of fevers or other new symptoms.  Past Medical History  Diagnosis Date  . Proctitis   . Cysts of both ovaries   . Seizures   . Anemia   . Anxiety   . Blood transfusion without reported diagnosis   . Depression   . Fatty liver 10/05/13   Past Surgical History  Procedure Laterality Date  . Ovarian cyst removal    . Laparoscopy N/A 09/28/2013    Procedure: LAPAROSCOPY OPERATIVE;  Surgeon: Terrance Mass, MD;  Location: [facility];  Service: Gynecology;  Laterality: N/A;  . Laparoscopic appendectomy Right 09/28/2013    Procedure: APPENDECTOMY LAPAROSCOPIC;  Surgeon: Terrance Mass, MD;  Location: [facility];  Service: Gynecology;  Laterality: Right;  . Salpingoophorectomy Right 09/28/2013    Procedure: SALPINGO OOPHORECTOMY;  Surgeon: Terrance Mass, MD;  Location: [facility];  Service: Gynecology;  Laterality: Right;  . Colonoscopy N/A 09/30/2013    Procedure: COLONOSCOPY;  Surgeon: Lafayette Dragon, MD;  Location: [facility];  Service: Endoscopy;   Laterality: N/A;   Family History  Problem Relation Age of Onset  . Diabetes Mother   . Hyperlipidemia Mother   . Stroke Mother   . Diabetes Father    History  Substance Use Topics  . Smoking status: Never Smoker   . Smokeless tobacco: Not on file  . Alcohol Use: Yes   OB History   Grav Para Term Preterm Abortions TAB SAB Ect Mult Living   7 3   4  4   3      Review of Systems  Constitutional: Positive for appetite change and fatigue. Negative for fever and chills.  HENT: Negative for congestion.   Eyes: Negative for pain.  Respiratory: Negative for shortness of breath.   Cardiovascular: Negative for chest pain.  Gastrointestinal: Positive for nausea, vomiting and diarrhea. Negative for abdominal pain and constipation.       Hemorrhoids  Genitourinary: Negative for dysuria.  Musculoskeletal: Negative for back pain.  Skin: Negative for rash and wound.  Neurological: Negative for headaches.  All other systems reviewed and are negative.     Allergies  Morphine and related; Tramadol; and Penicillins  Home Medications   Prior to Admission medications   Medication Sig Start Date End Date Taking? Authorizing Provider  HYDROcodone-acetaminophen (NORCO) 5-325 MG per tablet Take 2 tablets by mouth every 6 (six) hours as needed for severe pain. 11/04/13   Babette Relic, MD  HYDROcodone-acetaminophen (NORCO/VICODIN) 5-325 MG per tablet Take 1 tablet by mouth every 6 (six) hours as needed for moderate  pain.    Historical Provider, MD  hydrocortisone (ANUSOL-HC) 2.5 % rectal cream Apply rectally 2 times daily 11/02/13   Renaldo Reel, MD  oxyCODONE-acetaminophen (PERCOCET/ROXICET) 5-325 MG per tablet Take 1 tablet by mouth every 4 (four) hours as needed for moderate pain or severe pain.    Historical Provider, MD   BP 118/76  Pulse 90  Temp(Src) 98.1 F (36.7 C) (Oral)  Resp 18  SpO2 100%  LMP 09/25/2013 Physical Exam  Nursing note and vitals reviewed. Constitutional: She is  oriented to person, place, and time. She appears well-developed and well-nourished. No distress.  HENT:  Head: Normocephalic and atraumatic.  Eyes: Conjunctivae are normal.  Neck: Neck supple.  Cardiovascular: Normal rate, regular rhythm, normal heart sounds and intact distal pulses.   Pulmonary/Chest: Effort normal and breath sounds normal. She has no wheezes. She has no rales.  Abdominal: Soft. She exhibits no distension. There is no tenderness.  Genitourinary: Rectal exam shows external hemorrhoid (No signs of thrombosis).  Musculoskeletal: Normal range of motion.  Neurological: She is alert and oriented to person, place, and time.  Skin: Skin is warm and dry.    ED Course  Procedures (including critical care time) Labs Review Labs Reviewed  I-STAT CHEM 8, ED - Abnormal; Notable for the following:    Potassium 2.9 (*)    BUN <3 (*)    Glucose, Bld 127 (*)    Calcium, Ion 1.04 (*)    Hemoglobin 10.2 (*)    HCT 30.0 (*)    All other components within normal limits    Imaging Review No results found.   EKG Interpretation None      MDM   Final diagnoses:  Hemorrhoids  Nausea & vomiting    38 year old female with history of chronic abdominal pain, nausea, vomiting, diarrhea and hemorrhoids for the past 2 months. Patient has had multiple emergency department visits over the past few weeks for the same complaints. She has had multiple negative workups during that time including laboratory evaluation and imaging. No change in her symptoms at this time.  Patient was given Toradol in the emergency department for pain. She states that the previous pain medication including Vicodin and Percocet she was prescribed are "too strong for me" and only made her feel sicker. Will give course of Naprosyn.  Labs obtained with stable hemoglobin. She was noted to be hypokalemic. She was given by mouth potassium in the emergency department and will discharge with course of potassium. Given  multiple recent presentations and no change in symptoms do not feel additional workup indicated at this time. Findings no emergent causes to explain patient's symptoms.   Again emphasized to the patient the importance of establishing followup. Patient again stated "I just feel too weak and tired to go to any appointments". Emphasized the patient that if she hopes to get better she needed to followup with her appointments. Patient voiced understanding of treatment plan.    Renaldo Reel, MD 11/12/13 [phone]

## 2013-11-11 NOTE — ED Notes (Signed)
Pt arrived from home via [facility], complaint of abdominal cramping, nausea, vomiting, diarrhea, hemorrhoids x 2 months. Seen for similar symptoms over last 2 months, without follow up.

## 2013-11-11 NOTE — ED Notes (Signed)
Pt states Toradol helped her pain without making her nauseated.

## 2013-11-11 NOTE — ED Notes (Signed)
Beaver MD at bedside.

## 2013-11-13 NOTE — ED Provider Notes (Signed)
I saw and evaluated the patient, reviewed the resident's note and I agree with the findings and plan.   EKG Interpretation None      PT with chronic abdominal pain, several recent ED visits since moving to town. Needs outpatient workup.   Charles B. Karle Starch, MD 11/13/13 [phone]

## 2013-11-14 ENCOUNTER — Ambulatory Visit: Admitting: Internal Medicine

## 2013-11-22 ENCOUNTER — Encounter (HOSPITAL_COMMUNITY): Payer: Self-pay | Admitting: Emergency Medicine

## 2013-11-22 ENCOUNTER — Inpatient Hospital Stay (HOSPITAL_COMMUNITY)
Admission: EM | Admit: 2013-11-22 | Discharge: 2013-11-24 | DRG: 369 | Disposition: A | Discharge status: Home / Self Care | Payer: Self-pay | Attending: Internal Medicine | Admitting: Internal Medicine

## 2013-11-22 DIAGNOSIS — Z833 Family history of diabetes mellitus: Secondary | ICD-10-CM

## 2013-11-22 DIAGNOSIS — F411 Generalized anxiety disorder: Secondary | ICD-10-CM | POA: Diagnosis present

## 2013-11-22 DIAGNOSIS — Z9889 Other specified postprocedural states: Secondary | ICD-10-CM

## 2013-11-22 DIAGNOSIS — K760 Fatty (change of) liver, not elsewhere classified: Secondary | ICD-10-CM

## 2013-11-22 DIAGNOSIS — N946 Dysmenorrhea, unspecified: Secondary | ICD-10-CM

## 2013-11-22 DIAGNOSIS — D649 Anemia, unspecified: Secondary | ICD-10-CM

## 2013-11-22 DIAGNOSIS — F419 Anxiety disorder, unspecified: Secondary | ICD-10-CM

## 2013-11-22 DIAGNOSIS — N949 Unspecified condition associated with female genital organs and menstrual cycle: Secondary | ICD-10-CM | POA: Diagnosis present

## 2013-11-22 DIAGNOSIS — R109 Unspecified abdominal pain: Secondary | ICD-10-CM

## 2013-11-22 DIAGNOSIS — K648 Other hemorrhoids: Secondary | ICD-10-CM

## 2013-11-22 DIAGNOSIS — D500 Iron deficiency anemia secondary to blood loss (chronic): Secondary | ICD-10-CM | POA: Diagnosis present

## 2013-11-22 DIAGNOSIS — K2991 Gastroduodenitis, unspecified, with bleeding: Secondary | ICD-10-CM

## 2013-11-22 DIAGNOSIS — Z880 Allergy status to penicillin: Secondary | ICD-10-CM

## 2013-11-22 DIAGNOSIS — K6289 Other specified diseases of anus and rectum: Secondary | ICD-10-CM

## 2013-11-22 DIAGNOSIS — G8929 Other chronic pain: Secondary | ICD-10-CM | POA: Diagnosis present

## 2013-11-22 DIAGNOSIS — Z885 Allergy status to narcotic agent status: Secondary | ICD-10-CM

## 2013-11-22 DIAGNOSIS — D62 Acute posthemorrhagic anemia: Secondary | ICD-10-CM

## 2013-11-22 DIAGNOSIS — R55 Syncope and collapse: Secondary | ICD-10-CM

## 2013-11-22 DIAGNOSIS — R102 Pelvic and perineal pain unspecified side: Secondary | ICD-10-CM

## 2013-11-22 DIAGNOSIS — K2971 Gastritis, unspecified, with bleeding: Secondary | ICD-10-CM | POA: Diagnosis present

## 2013-11-22 DIAGNOSIS — K922 Gastrointestinal hemorrhage, unspecified: Secondary | ICD-10-CM

## 2013-11-22 DIAGNOSIS — K2970 Gastritis, unspecified, without bleeding: Secondary | ICD-10-CM | POA: Diagnosis present

## 2013-11-22 DIAGNOSIS — I951 Orthostatic hypotension: Secondary | ICD-10-CM

## 2013-11-22 DIAGNOSIS — K920 Hematemesis: Secondary | ICD-10-CM

## 2013-11-22 DIAGNOSIS — K2990 Gastroduodenitis, unspecified, without bleeding: Secondary | ICD-10-CM

## 2013-11-22 DIAGNOSIS — K7689 Other specified diseases of liver: Secondary | ICD-10-CM | POA: Diagnosis present

## 2013-11-22 DIAGNOSIS — K226 Gastro-esophageal laceration-hemorrhage syndrome: Principal | ICD-10-CM

## 2013-11-22 DIAGNOSIS — F329 Major depressive disorder, single episode, unspecified: Secondary | ICD-10-CM | POA: Diagnosis present

## 2013-11-22 DIAGNOSIS — Z8742 Personal history of other diseases of the female genital tract: Secondary | ICD-10-CM

## 2013-11-22 DIAGNOSIS — N920 Excessive and frequent menstruation with regular cycle: Secondary | ICD-10-CM

## 2013-11-22 DIAGNOSIS — F3289 Other specified depressive episodes: Secondary | ICD-10-CM | POA: Diagnosis present

## 2013-11-22 DIAGNOSIS — K921 Melena: Secondary | ICD-10-CM

## 2013-11-22 DIAGNOSIS — Z87440 Personal history of urinary (tract) infections: Secondary | ICD-10-CM

## 2013-11-22 DIAGNOSIS — Z823 Family history of stroke: Secondary | ICD-10-CM

## 2013-11-22 DIAGNOSIS — M545 Low back pain, unspecified: Secondary | ICD-10-CM | POA: Diagnosis present

## 2013-11-22 LAB — URINALYSIS, ROUTINE W REFLEX MICROSCOPIC
Bilirubin Urine: NEGATIVE
Glucose, UA: NEGATIVE mg/dL
HGB URINE DIPSTICK: NEGATIVE
Ketones, ur: NEGATIVE mg/dL
Leukocytes, UA: NEGATIVE
Nitrite: NEGATIVE
Protein, ur: NEGATIVE mg/dL
SPECIFIC GRAVITY, URINE: 1.006 (ref 1.005–1.030)
UROBILINOGEN UA: 1 mg/dL (ref 0.0–1.0)
pH: 6.5 (ref 5.0–8.0)

## 2013-11-22 LAB — CBC WITH DIFFERENTIAL/PLATELET
BASOS PCT: 0 % (ref 0–1)
Basophils Absolute: 0 10*3/uL (ref 0.0–0.1)
EOS ABS: 0 10*3/uL (ref 0.0–0.7)
Eosinophils Relative: 0 % (ref 0–5)
HEMATOCRIT: 23.7 % — AB (ref 36.0–46.0)
HEMOGLOBIN: 7.8 g/dL — AB (ref 12.0–15.0)
Lymphocytes Relative: 30 % (ref 12–46)
Lymphs Abs: 0.7 10*3/uL (ref 0.7–4.0)
MCH: 31.8 pg (ref 26.0–34.0)
MCHC: 32.9 g/dL (ref 30.0–36.0)
MCV: 96.7 fL (ref 78.0–100.0)
MONO ABS: 0.4 10*3/uL (ref 0.1–1.0)
Monocytes Relative: 17 % — ABNORMAL HIGH (ref 3–12)
Neutro Abs: 1.2 10*3/uL — ABNORMAL LOW (ref 1.7–7.7)
Neutrophils Relative %: 53 % (ref 43–77)
Platelets: 149 10*3/uL — ABNORMAL LOW (ref 150–400)
RBC: 2.45 MIL/uL — ABNORMAL LOW (ref 3.87–5.11)
RDW: 19.1 % — ABNORMAL HIGH (ref 11.5–15.5)
WBC: 2.2 10*3/uL — ABNORMAL LOW (ref 4.0–10.5)

## 2013-11-22 LAB — I-STAT CG4 LACTIC ACID, ED: LACTIC ACID, VENOUS: 2.04 mmol/L (ref 0.5–2.2)

## 2013-11-22 LAB — PREGNANCY, URINE: Preg Test, Ur: NEGATIVE

## 2013-11-22 LAB — LIPASE, BLOOD: LIPASE: 32 U/L (ref 11–59)

## 2013-11-22 MED ORDER — HYDROMORPHONE HCL PF 1 MG/ML IJ SOLN
1.0000 mg | Freq: Once | INTRAMUSCULAR | Status: AC
  Administered 2013-11-22: 1 mg via INTRAVENOUS
  Filled 2013-11-22: qty 1

## 2013-11-22 MED ORDER — ONDANSETRON HCL 4 MG/2ML IJ SOLN
4.0000 mg | Freq: Once | INTRAMUSCULAR | Status: AC
  Administered 2013-11-22: 4 mg via INTRAVENOUS
  Filled 2013-11-22: qty 2

## 2013-11-22 NOTE — ED Notes (Signed)
EKG given to EDP,Campos,MD. For review. 

## 2013-11-22 NOTE — ED Notes (Signed)
Bed: WA25 Expected date:  Expected time:  Means of arrival:  Comments: EMS  

## 2013-11-22 NOTE — ED Notes (Signed)
Per EMS, pt from home, reports  abd pain with n/v/d and syncopal episode.

## 2013-11-22 NOTE — ED Provider Notes (Signed)
CSN: [id number]     Arrival date & time 11/22/13  2225 History   First MD Initiated Contact with Patient 11/22/13 2301     Chief Complaint  Patient presents with  . Loss of Consciousness  . Nausea     (Consider location/radiation/quality/duration/timing/severity/associated sxs/prior Treatment) HPI  This a 38 year old female with chronic abdominal pain and multiple visits to the emergency room who presents with abdominal pain, nausea, vomiting, and diarrhea. Patient states her symptoms have been over last 2 months. She reports "I have been to the ER 14 times and I'm getting tired of it."  Patient reports lower abdominal pain that "I can't describe." Currently her pain is 10 out of 10. It comes and goes it is not worsened by food. His reports episodes where she's not real and that she binge eats and vomits. She reports "a lot of blood in her vomit." She reports bloody stools as well. She recently had a right oophorectomy and appendectomy. Patient also reports swelling of her face and ankles. She reports recent urinary tract infection. She is unsure when her last menstrual period was.  Patient also reports a syncopal episode earlier today when she stood up. She denies any chest pain during the event.  Past Medical History  Diagnosis Date  . Proctitis   . Cysts of both ovaries   . Seizures   . Anemia   . Anxiety   . Blood transfusion without reported diagnosis   . Depression   . Fatty liver 10/05/13   Past Surgical History  Procedure Laterality Date  . Ovarian cyst removal    . Laparoscopy N/A 09/28/2013    Procedure: LAPAROSCOPY OPERATIVE;  Surgeon: Terrance Mass, MD;  Location: [facility];  Service: Gynecology;  Laterality: N/A;  . Laparoscopic appendectomy Right 09/28/2013    Procedure: APPENDECTOMY LAPAROSCOPIC;  Surgeon: Terrance Mass, MD;  Location: [facility];  Service: Gynecology;  Laterality: Right;  . Salpingoophorectomy Right 09/28/2013    Procedure: SALPINGO OOPHORECTOMY;  Surgeon:  Terrance Mass, MD;  Location: [facility];  Service: Gynecology;  Laterality: Right;  . Colonoscopy N/A 09/30/2013    Procedure: COLONOSCOPY;  Surgeon: Lafayette Dragon, MD;  Location: [facility];  Service: Endoscopy;  Laterality: N/A;   Family History  Problem Relation Age of Onset  . Diabetes Mother   . Hyperlipidemia Mother   . Stroke Mother   . Diabetes Father    History  Substance Use Topics  . Smoking status: Never Smoker   . Smokeless tobacco: Not on file  . Alcohol Use: 2.4 oz/week    4 Glasses of wine per week     Comment: last had 2 days   OB History   Grav Para Term Preterm Abortions TAB SAB Ect Mult Living   7 3   4  4   3      Review of Systems  Constitutional: Negative for fever.  Respiratory: Negative for cough, chest tightness and shortness of breath.   Cardiovascular: Negative for chest pain.  Gastrointestinal: Positive for nausea, vomiting, abdominal pain, diarrhea and blood in stool.  Genitourinary: Negative for dysuria.  Musculoskeletal: Negative for back pain.  Skin: Negative for rash.  Neurological: Positive for dizziness and syncope. Negative for headaches.  Psychiatric/Behavioral: Negative for confusion.  All other systems reviewed and are negative.     Allergies  Morphine and related; Tramadol; and Penicillins  Home Medications   Prior to Admission medications   Not on File   BP 98/68  Pulse 89  Temp(Src) 97.9 F (36.6 C) (Oral)  Resp 16  Ht 5\' 6"  (1.676 m)  Wt 128 lb 15.5 oz (58.5 kg)  BMI 20.83 kg/m2  SpO2 100% Physical Exam  Nursing note and vitals reviewed. Constitutional: She is oriented to person, place, and time. She appears well-developed and well-nourished.  HENT:  Head: Normocephalic and atraumatic.  Mouth/Throat: Oropharynx is clear and moist.  Eyes: Pupils are equal, round, and reactive to light.  Neck: Neck supple.  Cardiovascular: Normal rate, regular rhythm and normal heart sounds.   No murmur heard. Pulmonary/Chest:  Effort normal and breath sounds normal. No respiratory distress. She has no wheezes.  Abdominal: Soft. Bowel sounds are normal. There is no tenderness. There is no rebound and no guarding.  Musculoskeletal:  Trace edema bilateral lower extremities  Neurological: She is alert and oriented to person, place, and time.  Skin: Skin is warm and dry.  Psychiatric: She has a normal mood and affect.    ED Course  Procedures (including critical care time) Labs Review Labs Reviewed  CBC WITH DIFFERENTIAL - Abnormal; Notable for the following:    WBC 2.2 (*)    RBC 2.45 (*)    Hemoglobin 7.8 (*)    HCT 23.7 (*)    RDW 19.1 (*)    Platelets 149 (*)    Neutro Abs 1.2 (*)    Monocytes Relative 17 (*)    All other components within normal limits  COMPREHENSIVE METABOLIC PANEL - Abnormal; Notable for the following:    Glucose, Bld 102 (*)    Calcium 8.3 (*)    Total Protein 5.8 (*)    Albumin 3.2 (*)    AST 108 (*)    All other components within normal limits  COMPREHENSIVE METABOLIC PANEL - Abnormal; Notable for the following:    Creatinine, Ser 0.46 (*)    Calcium 7.7 (*)    Total Protein 5.6 (*)    Albumin 3.2 (*)    AST 92 (*)    All other components within normal limits  CBC - Abnormal; Notable for the following:    WBC 2.1 (*)    RBC 2.38 (*)    Hemoglobin 7.6 (*)    HCT 23.2 (*)    RDW 19.0 (*)    Platelets 138 (*)    All other components within normal limits  LIPASE, BLOOD  PREGNANCY, URINE  URINALYSIS, ROUTINE W REFLEX MICROSCOPIC  PROTIME-INR  DRUGS OF ABUSE SCREEN W ALC, ROUTINE URINE  POC OCCULT BLOOD, ED  I-STAT CG4 LACTIC ACID, ED    Imaging Review Dg Abd Portable 2v  11/23/2013   CLINICAL DATA:  Abdominal pain.  EXAM: PORTABLE ABDOMEN - 2 VIEW  COMPARISON:  10/28/2013  FINDINGS: The bowel gas pattern is negative for obstruction. There is no evidence of free air. No radio-opaque calculi or other significant radiographic abnormality is seen.  IMPRESSION: Negative.    Electronically Signed   By: Jorje Guild M.D.   On: 11/23/2013 05:36     EKG Interpretation   Date/Time:  Wednesday Nov 22 2013 23:45:52 EDT Ventricular Rate:  93 PR Interval:  146 QRS Duration: 85 QT Interval:  423 QTC Calculation: 526 R Axis:   84 Text Interpretation:  Sinus rhythm Prolonged QT interval Confirmed by  Raeli Wiens  MD, Omao ([id number]) on 11/23/2013 12:36:55 AM      MDM   Final diagnoses:  Hematemesis  Orthostasis  Syncope   Patient presents with recurrent abdominal pain and  now endorses hematemesis and bloody stools.  She is nontoxic on exam. Heart rate noted to be 108. Patient is orthostatic.  Patient given fluids and pain medication. Work notable for hemoglobin of 7.8 which is down from baseline of 10.5.  review of patient's charts reveals recent colonoscopy which was largely unrevealing. Hemoccult negative and patient denies any recent heavy menstruation. Discussed with GI, Dr. Fuller Plan, who agrees that given the patient's symptomatic anemia and history of hematemesis, likely will need a endoscopy.  Patient will be admitted to Dr. Posey Pronto.  Merryl Hacker, MD 11/23/13 [phone]

## 2013-11-22 NOTE — ED Notes (Signed)
Pt reports abd pain with n/v/d, states that this have been going on for a while now.  Have been seen here for same in the past. Pt's fiance reports pt had several syncopal episodes today.  Pt does not recall.  Pt reports severe sharp pain in her pelvic area, states that she needs to stand up to ease the pain.  Pt made aware that it is not safe for her to stand up at this time d/t syncope.  Pt still insists on standing up.  Pt's fiance is assisting pt.

## 2013-11-23 ENCOUNTER — Encounter (HOSPITAL_COMMUNITY)
Admission: EM | Disposition: A | Discharge status: Home / Self Care | Payer: Self-pay | Source: Home / Self Care | Attending: Internal Medicine

## 2013-11-23 ENCOUNTER — Encounter (HOSPITAL_COMMUNITY): Payer: Self-pay | Admitting: *Deleted

## 2013-11-23 ENCOUNTER — Inpatient Hospital Stay (HOSPITAL_COMMUNITY)

## 2013-11-23 DIAGNOSIS — K226 Gastro-esophageal laceration-hemorrhage syndrome: Principal | ICD-10-CM

## 2013-11-23 DIAGNOSIS — K2990 Gastroduodenitis, unspecified, without bleeding: Secondary | ICD-10-CM

## 2013-11-23 DIAGNOSIS — K920 Hematemesis: Secondary | ICD-10-CM | POA: Diagnosis present

## 2013-11-23 DIAGNOSIS — K2970 Gastritis, unspecified, without bleeding: Secondary | ICD-10-CM

## 2013-11-23 DIAGNOSIS — F411 Generalized anxiety disorder: Secondary | ICD-10-CM

## 2013-11-23 DIAGNOSIS — D649 Anemia, unspecified: Secondary | ICD-10-CM

## 2013-11-23 DIAGNOSIS — R109 Unspecified abdominal pain: Secondary | ICD-10-CM

## 2013-11-23 DIAGNOSIS — N946 Dysmenorrhea, unspecified: Secondary | ICD-10-CM

## 2013-11-23 HISTORY — PX: ESOPHAGOGASTRODUODENOSCOPY: SHX5428

## 2013-11-23 LAB — COMPREHENSIVE METABOLIC PANEL
ALBUMIN: 3.2 g/dL — AB (ref 3.5–5.2)
ALBUMIN: 3.2 g/dL — AB (ref 3.5–5.2)
ALT: 34 U/L (ref 0–35)
ALT: 31 U/L (ref 0–35)
AST: 108 U/L — ABNORMAL HIGH (ref 0–37)
AST: 92 U/L — ABNORMAL HIGH (ref 0–37)
Alkaline Phosphatase: 110 U/L (ref 39–117)
Alkaline Phosphatase: 102 U/L (ref 39–117)
BILIRUBIN TOTAL: 0.5 mg/dL (ref 0.3–1.2)
BILIRUBIN TOTAL: 0.5 mg/dL (ref 0.3–1.2)
BUN: 10 mg/dL (ref 6–23)
BUN: 10 mg/dL (ref 6–23)
CALCIUM: 8.3 mg/dL — AB (ref 8.4–10.5)
CHLORIDE: 104 meq/L (ref 96–112)
CHLORIDE: 107 meq/L (ref 96–112)
CO2: 25 mEq/L (ref 19–32)
CO2: 26 mEq/L (ref 19–32)
CREATININE: 0.46 mg/dL — AB (ref 0.50–1.10)
Calcium: 7.7 mg/dL — ABNORMAL LOW (ref 8.4–10.5)
Creatinine, Ser: 0.52 mg/dL (ref 0.50–1.10)
GFR calc Af Amer: >90 mL/min (ref >90)
GFR calc Af Amer: >90 mL/min (ref >90)
GFR calc non Af Amer: >90 mL/min (ref >90)
GFR calc non Af Amer: >90 mL/min (ref >90)
Glucose, Bld: 102 mg/dL — ABNORMAL HIGH (ref 70–99)
Glucose, Bld: 82 mg/dL (ref 70–99)
Potassium: 4.3 mEq/L (ref 3.7–5.3)
Potassium: 3.9 mEq/L (ref 3.7–5.3)
Sodium: 142 mEq/L (ref 137–147)
Sodium: 144 mEq/L (ref 137–147)
TOTAL PROTEIN: 5.8 g/dL — AB (ref 6.0–8.3)
TOTAL PROTEIN: 5.6 g/dL — AB (ref 6.0–8.3)

## 2013-11-23 LAB — CBC
HCT: 23.2 % — ABNORMAL LOW (ref 36.0–46.0)
Hemoglobin: 7.6 g/dL — ABNORMAL LOW (ref 12.0–15.0)
MCH: 31.9 pg (ref 26.0–34.0)
MCHC: 32.8 g/dL (ref 30.0–36.0)
MCV: 97.5 fL (ref 78.0–100.0)
PLATELETS: 138 10*3/uL — AB (ref 150–400)
RBC: 2.38 MIL/uL — AB (ref 3.87–5.11)
RDW: 19 % — ABNORMAL HIGH (ref 11.5–15.5)
WBC: 2.1 10*3/uL — AB (ref 4.0–10.5)

## 2013-11-23 LAB — PROTIME-INR
INR: 1.08 (ref 0.00–1.49)
Prothrombin Time: 13.8 seconds (ref 11.6–15.2)

## 2013-11-23 LAB — POC OCCULT BLOOD, ED: FECAL OCCULT BLD: NEGATIVE

## 2013-11-23 SURGERY — EGD (ESOPHAGOGASTRODUODENOSCOPY)
Anesthesia: Moderate Sedation

## 2013-11-23 MED ORDER — SODIUM CHLORIDE 0.9 % IV SOLN
INTRAVENOUS | Status: DC
  Administered 2013-11-23 – 2013-11-24 (×4): via INTRAVENOUS

## 2013-11-23 MED ORDER — ONDANSETRON HCL 4 MG/2ML IJ SOLN
4.0000 mg | Freq: Four times a day (QID) | INTRAMUSCULAR | Status: DC | PRN
  Administered 2013-11-23: 4 mg via INTRAVENOUS
  Filled 2013-11-23: qty 2

## 2013-11-23 MED ORDER — ONDANSETRON HCL 4 MG/2ML IJ SOLN
4.0000 mg | Freq: Once | INTRAMUSCULAR | Status: AC
  Administered 2013-11-23: 4 mg via INTRAVENOUS
  Filled 2013-11-23: qty 2

## 2013-11-23 MED ORDER — ONDANSETRON HCL 4 MG PO TABS: 4.0000 mg | ORAL_TABLET | Freq: Four times a day (QID) | ORAL | Status: DC | PRN

## 2013-11-23 MED ORDER — FENTANYL CITRATE 0.05 MG/ML IJ SOLN
INTRAMUSCULAR | Status: AC
  Filled 2013-11-23: qty 2

## 2013-11-23 MED ORDER — PANTOPRAZOLE SODIUM 40 MG IV SOLR
40.0000 mg | Freq: Once | INTRAVENOUS | Status: AC
  Administered 2013-11-23: 40 mg via INTRAVENOUS
  Filled 2013-11-23: qty 40

## 2013-11-23 MED ORDER — PNEUMOCOCCAL VAC POLYVALENT 25 MCG/0.5ML IJ INJ
0.5000 mL | INJECTION | INTRAMUSCULAR | Status: DC
  Filled 2013-11-23 (×2): qty 0.5

## 2013-11-23 MED ORDER — DIPHENHYDRAMINE HCL 50 MG/ML IJ SOLN
INTRAMUSCULAR | Status: AC
  Filled 2013-11-23: qty 1

## 2013-11-23 MED ORDER — HYDROMORPHONE HCL PF 1 MG/ML IJ SOLN
INTRAMUSCULAR | Status: AC
  Administered 2013-11-23: 1 mg via INTRAVENOUS
  Filled 2013-11-23: qty 1

## 2013-11-23 MED ORDER — SODIUM CHLORIDE 0.9 % IJ SOLN
3.0000 mL | Freq: Two times a day (BID) | INTRAMUSCULAR | Status: DC
  Administered 2013-11-24: 11:00:00 via INTRAVENOUS

## 2013-11-23 MED ORDER — SODIUM CHLORIDE 0.9 % IV SOLN
INTRAVENOUS | Status: DC
  Administered 2013-11-23: 500 mL via INTRAVENOUS

## 2013-11-23 MED ORDER — MIDAZOLAM HCL 10 MG/2ML IJ SOLN
INTRAMUSCULAR | Status: AC
  Filled 2013-11-23: qty 2

## 2013-11-23 MED ORDER — PANTOPRAZOLE SODIUM 40 MG IV SOLR
40.0000 mg | Freq: Two times a day (BID) | INTRAVENOUS | Status: DC
  Administered 2013-11-23 – 2013-11-24 (×3): 40 mg via INTRAVENOUS
  Filled 2013-11-23 (×4): qty 40

## 2013-11-23 MED ORDER — SODIUM CHLORIDE 0.9 % IV BOLUS (SEPSIS)
1000.0000 mL | Freq: Once | INTRAVENOUS | Status: AC
  Administered 2013-11-23: 1000 mL via INTRAVENOUS

## 2013-11-23 MED ORDER — FENTANYL CITRATE 0.05 MG/ML IJ SOLN
INTRAMUSCULAR | Status: DC | PRN
  Administered 2013-11-23 (×2): 25 ug via INTRAVENOUS

## 2013-11-23 MED ORDER — BUTAMBEN-TETRACAINE-BENZOCAINE 2-2-14 % EX AERO
INHALATION_SPRAY | CUTANEOUS | Status: DC | PRN
  Administered 2013-11-23: 2 via TOPICAL

## 2013-11-23 MED ORDER — HYDROMORPHONE HCL PF 1 MG/ML IJ SOLN
1.0000 mg | Freq: Once | INTRAMUSCULAR | Status: AC
  Administered 2013-11-23: 1 mg via INTRAVENOUS
  Filled 2013-11-23: qty 1

## 2013-11-23 MED ORDER — HYDROMORPHONE HCL PF 1 MG/ML IJ SOLN
0.5000 mg | INTRAMUSCULAR | Status: DC | PRN
  Administered 2013-11-23 – 2013-11-24 (×10): 1 mg via INTRAVENOUS
  Filled 2013-11-23 (×9): qty 1

## 2013-11-23 MED ORDER — MIDAZOLAM HCL 10 MG/2ML IJ SOLN
INTRAMUSCULAR | Status: DC | PRN
  Administered 2013-11-23: 1 mg via INTRAVENOUS
  Administered 2013-11-23: 2 mg via INTRAVENOUS
  Administered 2013-11-23: 1 mg via INTRAVENOUS
  Administered 2013-11-23: 2 mg via INTRAVENOUS

## 2013-11-23 NOTE — Consult Note (Signed)
Referring Provider: No ref. provider found Primary Care Physician:  No PCP Per Patient Primary Gastroenterologist:  Dr. Olevia Perches     Reason for Consultation:  Anemia; ? hematemesis  HPI: Kristen Roberson is a 38 y.o. female with PMH of bleeding hemorrhoids, anxiety and depression, recent appendectomy, and ovarian cysts.  She presented to [facility] ED with complaints of post-prandial abdominal pain and vomiting for the past few days.  She has been feeling generally weak and dizzy recently as well.  After some episodes of vomiting she began noticing blood mixed with the food/emesis.  Her significant other reports that she passed out once as well.  She denies any acid reflux symptoms.  Does not take much medication, but did take just a few Ibuprofen recently (but she says that she vomited then back up anyway because medications do not sit well in her stomach).  She was found to have Hgb of 7.8 grams and repeat the AM was 7.6 grams.  FOBT negative.  Lipase, BMP, and lactic acid normal. AST somewhat elevated.  She does drink ETOH.  She is currently on pantoprazole 40 mg IV BID.  Colonoscopy 09/30/2013 by Dr. Olevia Perches was normal with normal rectal biopsies.  Apparently she had a previous history of proctitis that was thought to be infectious.  Has hemorrhoids that cause her a lot of bleeding on regular basis.   She has undergone 3 CT scans of the abdomen and pelvis with contrast since 09/17/2013 without any remarkable findings.   Past Medical History  Diagnosis Date  . Proctitis   . Cysts of both ovaries   . Seizures   . Anemia   . Anxiety   . Blood transfusion without reported diagnosis   . Depression   . Fatty liver 10/05/13    Past Surgical History  Procedure Laterality Date  . Ovarian cyst removal    . Laparoscopy N/A 09/28/2013    Procedure: LAPAROSCOPY OPERATIVE;  Surgeon: Terrance Mass, MD;  Location: [facility];  Service: Gynecology;  Laterality: N/A;  . Laparoscopic appendectomy Right 09/28/2013     Procedure: APPENDECTOMY LAPAROSCOPIC;  Surgeon: Terrance Mass, MD;  Location: [facility];  Service: Gynecology;  Laterality: Right;  . Salpingoophorectomy Right 09/28/2013    Procedure: SALPINGO OOPHORECTOMY;  Surgeon: Terrance Mass, MD;  Location: [facility];  Service: Gynecology;  Laterality: Right;  . Colonoscopy N/A 09/30/2013    Procedure: COLONOSCOPY;  Surgeon: Lafayette Dragon, MD;  Location: [facility];  Service: Endoscopy;  Laterality: N/A;    Prior to Admission medications   Not on File    Current Facility-Administered Medications  Medication Dose Route Frequency Provider Last Rate Last Dose  . 0.9 %  sodium chloride infusion   Intravenous Continuous Berle Mull, MD 100 mL/hr at 11/23/13 0503    . HYDROmorphone (DILAUDID) injection 0.5-1 mg  0.5-1 mg Intravenous Q3H PRN Berle Mull, MD   1 mg at 11/23/13 0804  . ondansetron (ZOFRAN) tablet 4 mg  4 mg Oral Q6H PRN Berle Mull, MD       Or  . ondansetron (ZOFRAN) injection 4 mg  4 mg Intravenous Q6H PRN Berle Mull, MD      . pantoprazole (PROTONIX) injection 40 mg  40 mg Intravenous Q12H Berle Mull, MD      . Derrill Memo ON 11/24/2013] pneumococcal 23 valent vaccine (PNU-IMMUNE) injection 0.5 mL  0.5 mL Intramuscular Tomorrow-1000 Berle Mull, MD      . sodium chloride 0.9 % injection 3 mL  3 mL Intravenous Q12H Berle Mull, MD        Allergies as of 11/22/2013 - Review Complete 11/22/2013  Allergen Reaction Noted  . Morphine and related Anaphylaxis 09/17/2013  . Tramadol Other (See Comments) 09/17/2013  . Penicillins Other (See Comments) 09/17/2013    Family History  Problem Relation Age of Onset  . Diabetes Mother   . Hyperlipidemia Mother   . Stroke Mother   . Diabetes Father     History   Social History  . Marital Status: Legally Separated    Spouse Name: N/A    Number of Children: N/A  . Years of Education: N/A   Occupational History  . Not on file.   Social History Main Topics  . Smoking status: Never  Smoker   . Smokeless tobacco: Not on file  . Alcohol Use: 2.4 oz/week    4 Glasses of wine per week     Comment: last had 2 days  . Drug Use: No  . Sexual Activity: Not on file   Other Topics Concern  . Not on file   Social History Narrative  . No narrative on file    Review of Systems: Ten point ROS is O/W negative except as mentioned in HPI.  Physical Exam: Vital signs in last 24 hours: Temp:  [97.8 F (36.6 C)-98.2 F (36.8 C)] 97.9 F (36.6 C) (05/07 0350) Pulse Rate:  [74-111] 89 (05/07 0350) Resp:  [16] 16 (05/07 0350) BP: (96-111)/(54-68) 98/68 mmHg (05/07 0350) SpO2:  [96 %-100 %] 100 % (05/07 0350) Weight:  [128 lb 15.5 oz (58.5 kg)] 128 lb 15.5 oz (58.5 kg) (05/07 0350) Last BM Date: 11/22/13 General:  Alert, Well-developed, well-nourished, pleasant and cooperative in NAD Head:  Normocephalic and atraumatic. Eyes:  Sclera clear, no icterus.  Conjunctiva pink. Ears:  Normal auditory acuity. Mouth:  No deformity or lesions.   Lungs:  Clear throughout to auscultation.  No wheezes, crackles, or rhonchi.  Heart:  Regular rate and rhythm; no murmurs, clicks, rubs, or gallops. Abdomen:  Soft, non-distended.  BS present.  Non-tender. Rectal:  Deferred.  Heme negative in the ED.  Msk:  Symmetrical without gross deformities. Pulses:  Normal pulses noted. Extremities:  Without clubbing or edema. Neurologic:  Alert and  oriented x4;  grossly normal neurologically. Skin:  Intact without significant lesions or rashes. Psych:  Alert and cooperative. Normal mood and affect.  Lab Results:  Recent Labs  11/22/13 2300 11/23/13 0445  WBC 2.2* 2.1*  HGB 7.8* 7.6*  HCT 23.7* 23.2*  PLT 149* 138*   BMET  Recent Labs  11/22/13 2300 11/23/13 0445  NA 142 144  K 4.3 3.9  CL 104 107  CO2 25 26  GLUCOSE 102* 82  BUN 10 10  CREATININE 0.52 0.46*  CALCIUM 8.3* 7.7*   LFT  Recent Labs  11/23/13 0445  PROT 5.6*  ALBUMIN 3.2*  AST 92*  ALT 31  ALKPHOS 102   BILITOT 0.5   PT/INR  Recent Labs  11/23/13 0445  LABPROT 13.8  INR 1.08   Studies/Results: Dg Abd Portable 2v  11/23/2013   CLINICAL DATA:  Abdominal pain.  EXAM: PORTABLE ABDOMEN - 2 VIEW  COMPARISON:  10/28/2013  FINDINGS: The bowel gas pattern is negative for obstruction. There is no evidence of free air. No radio-opaque calculi or other significant radiographic abnormality is seen.  IMPRESSION: Negative.   Electronically Signed   By: Jorje Guild M.D.   On: 11/23/2013 05:36  IMPRESSION:  -Hematemesis:  DDx includes MWT vs ulcer disease vs esophagitis, etc. -Acute on chronic anemia:  Symptomatic with drop in Hgb from 10.2 grams to 7.8 grams in one week.  PLAN: -EGD today. -Monitor Hgb.  Transfuse prn. -Continue pantoprazole 40 mg IV BID for now.   Laban Emperor. Luiz Trumpower  11/23/2013, 9:11 AM  Pager number [phone]

## 2013-11-23 NOTE — ED Notes (Signed)
Pt is sitting up on the stretcher with her knees to her chest crying.  PT reports severe pelvic pain.  States "i just want it to stop."  Fiance remains at bedside

## 2013-11-23 NOTE — Consult Note (Signed)
Patient seen, examined, and I agree with the above documentation, including the assessment and plan. Upper endoscopy today to evaluate hematemesis Agree with twice daily PPI for now Recent colonoscopy with Dr. Olevia Perches reviewed The nature of the procedure, as well as the risks, benefits, and alternatives were carefully and thoroughly reviewed with the patient. Ample time for discussion and questions allowed. The patient understood, was satisfied, and agreed to proceed.

## 2013-11-23 NOTE — Op Note (Signed)
[facility] [place], [address]   ENDOSCOPY PROCEDURE REPORT  PATIENT: Kristen Roberson, Kristen Roberson  MR#: [id number] BIRTHDATE: Jul 12, 1976 , 38  yrs. old GENDER: Female ENDOSCOPIST: Jerene Bears, MD REFERRED BY:  [facility] PROCEDURE DATE:  11/23/2013 PROCEDURE:  EGD w/ biopsy ASA CLASS:     Class II INDICATIONS:  Hematemesis.   Anemia. MEDICATIONS: These medications were titrated to patient response per physician's verbal order, Fentanyl 50 mcg IV, and Versed 6 mg IV TOPICAL ANESTHETIC: Cetacaine Spray  DESCRIPTION OF PROCEDURE: After the risks benefits and alternatives of the procedure were thoroughly explained, informed consent was obtained.  The Pentax Gastroscope [id number] endoscope was introduced through the mouth and advanced to the second portion of the duodenum. Without limitations.  The instrument was slowly withdrawn as the mucosa was fully examined.    ESOPHAGUS: 2 non-bleeding Mallory-Weiss tears were found at the gastroesophageal junction.   The esophagus was otherwise normal.  STOMACH: Gastritis (inflammation) was found in the cardia, gastric body, possible related to retching and a striped gastritis in the gastric antrum.  Multiple biopsies were performed using cold forceps from proximal and distal stomach.  DUODENUM: The duodenal mucosa showed no abnormalities in the bulb and second portion of the duodenum.  Retroflexed views revealed a Mallory-Weiss tear as described above.  The scope was then withdrawn from the patient and the procedure completed.  COMPLICATIONS: There were no complications.    ENDOSCOPIC IMPRESSION: 1.   Mallory-Weiss tear at the gastroesophageal junction 2.   The esophagus was otherwise normal. 3.   Gastritis (inflammation) was found in the cardia, gastric body, and gastric antrum; multiple biopsies 4.   The duodenal mucosa showed no abnormalities in the bulb and second portion of the  duodenum  RECOMMENDATIONS: 1.  Await biopsy results 2.  Daily PPI and anti-emetic PRN 3.  Follow-up with Dr.  Olevia Perches  eSigned:  Jerene Bears, MD 11/23/2013 11:34 AM   CC:The Patient and [name], MD  PATIENT NAME:  Almedia, Cordell MR#: [id number]

## 2013-11-23 NOTE — H&P (Signed)
[facility] History and Physical  Patient: Kristen Roberson  NIO:[id number]  DOB: 1976/03/07  DOS: the patient was seen and examined on 11/23/2013 PCP: No PCP Per Patient  Chief Complaint: Abdominal pain  HPI: Kristen Roberson is a 38 y.o. female with Past medical history of hemorrhoids, anxiety, ovarian cyst status post removal. The patient presented with complaints of abdominal pain. She mentions since last 2 days she has been having complaints of abdominal pain associated with nausea and vomiting and diarrhea. Today she has been having generalized weakness and dizziness on change of her condition. She mentions that she may have passed out as well once. She complains of lower abdominal pain as well as lower back pain. She can do some burning urination. She thinks her face is swollen and legs are swollen as well. She denies any chest pain or shortness of breath. She complains of nausea and vomiting of more than 10 episodes of vomiting in the last 24 hours with some blood. She denies any acid reflux which has frequent. She denies any fever or chills. She denies any smoking history or drug abuse. She mentions she has been drinking alcohol in the past and stopped drinking 2 days ago only  The patient is coming from home. And at her baseline independent for most of her ADL.  Review of Systems: as mentioned in the history of present illness.  A Comprehensive review of the other systems is negative.  Past Medical History  Diagnosis Date  . Proctitis   . Cysts of both ovaries   . Seizures   . Anemia   . Anxiety   . Blood transfusion without reported diagnosis   . Depression   . Fatty liver 10/05/13   Past Surgical History  Procedure Laterality Date  . Ovarian cyst removal    . Laparoscopy N/A 09/28/2013    Procedure: LAPAROSCOPY OPERATIVE;  Surgeon: Terrance Mass, MD;  Location: [facility];  Service: Gynecology;  Laterality: N/A;  . Laparoscopic appendectomy Right 09/28/2013    Procedure:  APPENDECTOMY LAPAROSCOPIC;  Surgeon: Terrance Mass, MD;  Location: [facility];  Service: Gynecology;  Laterality: Right;  . Salpingoophorectomy Right 09/28/2013    Procedure: SALPINGO OOPHORECTOMY;  Surgeon: Terrance Mass, MD;  Location: [facility];  Service: Gynecology;  Laterality: Right;  . Colonoscopy N/A 09/30/2013    Procedure: COLONOSCOPY;  Surgeon: Lafayette Dragon, MD;  Location: [facility];  Service: Endoscopy;  Laterality: N/A;   Social History:  reports that she has never smoked. She does not have any smokeless tobacco history on file. She reports that she drinks about 2.4 ounces of alcohol per week. She reports that she does not use illicit drugs.  Allergies  Allergen Reactions  . Morphine And Related Anaphylaxis        . Tramadol Other (See Comments)    Seizures   . Penicillins Other (See Comments)    Unknown childhood reaction.    Family History  Problem Relation Age of Onset  . Diabetes Mother   . Hyperlipidemia Mother   . Stroke Mother   . Diabetes Father     Prior to Admission medications   Not on File    Physical Exam: Filed Vitals:   11/22/13 2337 11/22/13 2339 11/23/13 0321 11/23/13 0350  BP: 102/60 104/59 96/54 98/68   Pulse: 107 111 74 89  Temp:   97.8 F (36.6 C) 97.9 F (36.6 C)  TempSrc:   Oral Oral  Resp:   16 16  Height:    5\' 6"  (1.676 m)  Weight:    58.5 kg (128 lb 15.5 oz)  SpO2:   97% 100%    General: Alert, Awake and Oriented to Time, Place and Person. Appear in marked distress Eyes: PERRL ENT: Oral Mucosa clear moist. Neck: No JVD Cardiovascular: S1 and S2 Present, no Murmur, Peripheral Pulses Present Respiratory: Bilateral Air entry equal and Decreased, Clear to Auscultation,  No Crackles, no wheezes Abdomen: Bowel Sound Present, Soft and diffuse mild tender no guarding no rigidity Skin: No Rash Extremities: No Pedal edema, no calf tenderness Neurologic: Grossly no focal neuro deficit.  Labs on Admission:  CBC:  Recent  Labs Lab 11/22/13 2300  WBC 2.2*  NEUTROABS 1.2*  HGB 7.8*  HCT 23.7*  MCV 96.7  PLT 149*    CMP     Component Value Date/Time   NA 142 11/22/2013 2300   K 4.3 11/22/2013 2300   CL 104 11/22/2013 2300   CO2 25 11/22/2013 2300   GLUCOSE 102* 11/22/2013 2300   BUN 10 11/22/2013 2300   CREATININE 0.52 11/22/2013 2300   CALCIUM 8.3* 11/22/2013 2300   PROT 5.8* 11/22/2013 2300   ALBUMIN 3.2* 11/22/2013 2300   AST 108* 11/22/2013 2300   ALT 34 11/22/2013 2300   ALKPHOS 110 11/22/2013 2300   BILITOT 0.5 11/22/2013 2300   GFRNONAA >90 11/22/2013 2300   GFRAA >90 11/22/2013 2300     Recent Labs Lab 11/22/13 2300  LIPASE 32   No results found for this basename: AMMONIA,  in the last 168 hours  No results found for this basename: CKTOTAL, CKMB, CKMBINDEX, TROPONINI,  in the last 168 hours BNP (last 3 results) No results found for this basename: PROBNP,  in the last 8760 hours  Radiological Exams on Admission: No results found.   Assessment/Plan Principal Problem:   Symptomatic anemia Active Problems:   Female pelvic pain   Anxiety   GIB (gastrointestinal bleeding)   Internal hemorrhoids with other complication   1. Symptomatic anemia GI bleed Hemorrhoids Abdominal pain GI has been consulted who will be following the patient for possible upper GI endoscopy. At present I would start her on IV Protonix. Her hemoglobin has dropped down and repeat her hemoglobin remains in the same range. Continue to monitor. IV fluids. She had a blood transfusion for hemoglobin less than 7. X-ray of the abdomen to look for any other abdominal pathology. She has multiple CT scans and multiple ultrasounds without any acute finding. Would also check U. tox. N.p.o. except medications  Consults: Gastroenterology  DVT Prophylaxis: mechanical compression device Nutrition: N.p.o.  Code Status: Full  Family Communication: Significant other was present at bedside, opportunity was given to ask question and all  questions were answered satisfactorily at the time of interview. Disposition: Admitted to inpatient in telemetry unit.  Author: Berle Mull, MD [facility] Pager: [phone] 11/23/2013, 5:07 AM    If 7PM-7AM, please contact night-coverage [email or web address] Password [email or web address]

## 2013-11-24 ENCOUNTER — Encounter (HOSPITAL_COMMUNITY): Payer: Self-pay | Admitting: Internal Medicine

## 2013-11-24 ENCOUNTER — Encounter: Payer: Self-pay | Admitting: Internal Medicine

## 2013-11-24 DIAGNOSIS — D62 Acute posthemorrhagic anemia: Secondary | ICD-10-CM

## 2013-11-24 LAB — CBC
HEMATOCRIT: 23.5 % — AB (ref 36.0–46.0)
HEMOGLOBIN: 7.5 g/dL — AB (ref 12.0–15.0)
MCH: 31.8 pg (ref 26.0–34.0)
MCHC: 31.9 g/dL (ref 30.0–36.0)
MCV: 99.6 fL (ref 78.0–100.0)
Platelets: 184 10*3/uL (ref 150–400)
RBC: 2.36 MIL/uL — AB (ref 3.87–5.11)
RDW: 17.8 % — ABNORMAL HIGH (ref 11.5–15.5)
WBC: 2.6 10*3/uL — ABNORMAL LOW (ref 4.0–10.5)

## 2013-11-24 LAB — URINE DRUGS OF ABUSE SCREEN W ALC, ROUTINE (REF LAB)
Amphetamine Screen, Ur: NEGATIVE
Barbiturate Quant, Ur: NEGATIVE
Benzodiazepines.: POSITIVE — AB
CREATININE, U: 46.1 mg/dL
Cocaine Metabolites: NEGATIVE
MARIJUANA METABOLITE: NEGATIVE
Methadone: NEGATIVE
OPIATE SCREEN, URINE: NEGATIVE
PROPOXYPHENE: NEGATIVE
Phencyclidine (PCP): NEGATIVE

## 2013-11-24 MED ORDER — PANTOPRAZOLE SODIUM 40 MG PO TBEC
40.0000 mg | DELAYED_RELEASE_TABLET | Freq: Every day | ORAL | Status: DC
  Administered 2013-11-24: 40 mg via ORAL
  Filled 2013-11-24: qty 1

## 2013-11-24 MED ORDER — OXYCODONE HCL 5 MG PO TABS: 5.0000 mg | ORAL_TABLET | ORAL | Status: DC | PRN

## 2013-11-24 MED ORDER — PANTOPRAZOLE SODIUM 40 MG PO TBEC: 40.0000 mg | DELAYED_RELEASE_TABLET | Freq: Every day | ORAL | Status: DC

## 2013-11-24 MED ORDER — OXYCODONE HCL 5 MG PO TABS
5.0000 mg | ORAL_TABLET | ORAL | Status: DC | PRN
  Administered 2013-11-24: 5 mg via ORAL
  Filled 2013-11-24: qty 1

## 2013-11-24 NOTE — Progress Notes (Signed)
I agree with the above documentation, including the assessment and plan. No evidence for further bleeding, biopsies negative for H. Pylori. Mallory-Weiss tear felt to be responsible for hematemesis which has resolved Daily PPI for one month, follow up with Dr. Olevia Perches

## 2013-11-24 NOTE — Progress Notes (Signed)
Chart reviewed. Spoke with pt concerning PCP. Pt states that she has no PCP.  Pt agreed with going to [facility] for a follow up on 12/07/13 at 1400.

## 2013-11-24 NOTE — Discharge Summary (Signed)
Physician Discharge Summary  Kristen Roberson RSW:[id number] DOB: Jan 24, 1976 DOA: 11/22/2013  PCP: No PCP Per Patient  Admit date: 11/22/2013 Discharge date: 11/24/2013  Time spent: 45 minutes  Recommendations for Outpatient Follow-up:  -Will be discharged home today. -Advised to follow up with PCP in 2 weeks.    Discharge Diagnoses:  Principal Problem:   GIB (gastrointestinal bleeding) Active Problems:   Hematemesis   Female pelvic pain   Anxiety   Internal hemorrhoids with other complication   Acute blood loss anemia   Discharge Condition: Stable and improved  Filed Weights   11/23/13 0350 11/24/13 0433  Weight: 58.5 kg (128 lb 15.5 oz) 60.8 kg (134 lb 0.6 oz)    History of present illness:  39 y.o. female with Past medical history of hemorrhoids, anxiety, ovarian cyst status post removal.  The patient presented with complaints of abdominal pain. She mentions since last 2 days she has been having complaints of abdominal pain associated with nausea and vomiting and diarrhea. Today she has been having generalized weakness and dizziness on change of her condition. She mentions that she may have passed out as well once. She complains of lower abdominal pain as well as lower back pain. She can do some burning urination. She thinks her face is swollen and legs are swollen as well. She denies any chest pain or shortness of breath. She complains of nausea and vomiting of more than 10 episodes of vomiting in the last 24 hours with some blood. She denies any acid reflux which has frequent. She denies any fever or chills.  She denies any smoking history or drug abuse. She mentions she has been drinking alcohol in the past and stopped drinking 2 days ago only  The patient is coming from home. And at her baseline independent for most of her ADL. Hospitalist admission was requested.   Hospital Course:   GIB/Hematemesis -Found to have a Mallory-Weiss Tear on EGD as well as gastriris. -Continue  PPI daily. -Appreciate GI input.  ABLA -Hb has remained stable at around 7.6. -She has been ambulating and no longer feels dizzy/lightheaded. -No acute indication for transfusion.  Procedures:  EGD   Consultations:  GI  Discharge Instructions      Discharge Orders   Future Appointments Provider Department Dept Phone   12/07/2013 2:00 PM Angelica Chessman, MD [facility] [phone]   Future Orders Complete By Expires   Discontinue IV  As directed    Increase activity slowly  As directed        Medication List         oxyCODONE 5 MG immediate release tablet  Commonly known as:  Oxy IR/ROXICODONE  Take 1 tablet (5 mg total) by mouth every 4 (four) hours as needed for severe pain.     pantoprazole 40 MG tablet  Commonly known as:  PROTONIX  Take 1 tablet (40 mg total) by mouth daily.       Allergies  Allergen Reactions  . Morphine And Related Anaphylaxis        . Tramadol Other (See Comments)    Seizures   . Penicillins Other (See Comments)    Unknown childhood reaction.   Follow-up Information   Follow up with Angelica Chessman, MD On 12/07/2013. (appointment time 2:00 PM)    Specialty:  Internal Medicine   Contact information:   [place] [address] [phone]        The results of  significant diagnostics from this hospitalization (including imaging, microbiology, ancillary and laboratory) are listed below for reference.    Significant Diagnostic Studies: Ct Abdomen Pelvis W Contrast  10/28/2013   CLINICAL DATA:  Lower abdominal pain. Swelling in the legs. Nausea, vomiting, diarrhea. Previous appendectomy and right ovary removed 1 month ago.  EXAM: CT ABDOMEN AND PELVIS WITH CONTRAST  TECHNIQUE: Multidetector CT imaging of the abdomen and pelvis was performed using the standard protocol following bolus administration of intravenous contrast.  CONTRAST:  38mL OMNIPAQUE IOHEXOL 300 MG/ML  SOLN   COMPARISON:  10/05/2013.  09/17/2013.  FINDINGS: Motion artifact limits visualization of the lung bases. No focal infiltration identified.  Diffuse fatty infiltration of the liver. No focal lesions. The gallbladder, pancreas, spleen, adrenal glands, kidneys, abdominal aorta, inferior vena cava, and retroperitoneal lymph nodes are unremarkable. The stomach and small bowel are decompressed. Colon is decompressed. No free air or free fluid in the abdomen.  Pelvis: Small amount of free fluid in the right pelvis is likely physiologic or possibly postoperative. Uterus is anteverted without enlargement. No abnormal adnexal masses. The appendix is surgically absent by history with surgical clips present in the right lower quadrant. The colon is decompressed and therefore cannot be effectively evaluated for wall thickening. No extra colonic infiltration or abscess. Bladder wall is not thickened. No significant pelvic lymphadenopathy. Spondylolysis with mild spondylolisthesis at L5-S1. No destructive bone lesions appreciated.  IMPRESSION: No acute process demonstrated in the abdomen or pelvis. Small amount of free fluid in the pelvis is likely physiologic. Diffuse fatty infiltration of the liver   Electronically Signed   By: Lucienne Capers M.D.   On: 10/28/2013 21:56   Dg Abd Portable 2v  11/23/2013   CLINICAL DATA:  Abdominal pain.  EXAM: PORTABLE ABDOMEN - 2 VIEW  COMPARISON:  10/28/2013  FINDINGS: The bowel gas pattern is negative for obstruction. There is no evidence of free air. No radio-opaque calculi or other significant radiographic abnormality is seen.  IMPRESSION: Negative.   Electronically Signed   By: Jorje Guild M.D.   On: 11/23/2013 05:36    Microbiology: No results found for this or any previous visit (from the past 240 hour(s)).   Labs: Basic Metabolic Panel:  Recent Labs Lab 11/22/13 2300 11/23/13 0445  NA 142 144  K 4.3 3.9  CL 104 107  CO2 25 26  GLUCOSE 102* 82  BUN 10 10    CREATININE 0.52 0.46*  CALCIUM 8.3* 7.7*   Liver Function Tests:  Recent Labs Lab 11/22/13 2300 11/23/13 0445  AST 108* 92*  ALT 34 31  ALKPHOS 110 102  BILITOT 0.5 0.5  PROT 5.8* 5.6*  ALBUMIN 3.2* 3.2*    Recent Labs Lab 11/22/13 2300  LIPASE 32   No results found for this basename: AMMONIA,  in the last 168 hours CBC:  Recent Labs Lab 11/22/13 2300 11/23/13 0445 11/24/13 0940  WBC 2.2* 2.1* 2.6*  NEUTROABS 1.2*  --   --   HGB 7.8* 7.6* 7.5*  HCT 23.7* 23.2* 23.5*  MCV 96.7 97.5 99.6  PLT 149* 138* 184   Cardiac Enzymes: No results found for this basename: CKTOTAL, CKMB, CKMBINDEX, TROPONINI,  in the last 168 hours BNP: BNP (last 3 results) No results found for this basename: PROBNP,  in the last 8760 hours CBG: No results found for this basename: GLUCAP,  in the last 168 hours     Signed:  Erline Hau  [facility] Pager: [phone] 11/24/2013,  3:56 PM

## 2013-11-24 NOTE — Clinical Documentation Improvement (Signed)
  Query 1 of 3  Please document the Possible, Probable, Suspected or Known cause of the patient's GI Bleed.   Query 2 of 3   "Acute on Chronic Anemia" documented in GI Consult note.  Possible Clinical Conditions:   - Acute Blood Loss Anemia    - Other Type of Anemia   - Unable to Clinically Determine   Query 3 of 3  Review of [facility] labs shows consistently low WBC, H&H and intermittently low Platelets  Please document the clinical significance, if any, regarding the abnormal CBC results.     Thank You,  Erling Conte ,RN BSN CCDS [employer] Clinical Documentation Specialist:  [phone] [place] Information Management

## 2013-11-24 NOTE — Progress Notes (Signed)
[facility] Progress Note  Subjective:  Feels ok.  Complaining of epigastric abdominal pain.  Tolerated some full liquids last night.  Would like to try some solid food.  Objective:  Vital signs in last 24 hours: Temp:  [97.7 F (36.5 C)-98.3 F (36.8 C)] 97.9 F (36.6 C) (05/08 0433) Pulse Rate:  [68-99] 72 (05/08 0433) Resp:  [8-19] 18 (05/08 0433) BP: (106-131)/(52-95) 113/78 mmHg (05/08 0433) SpO2:  [99 %-100 %] 99 % (05/08 0433) Weight:  [134 lb 0.6 oz (60.8 kg)] 134 lb 0.6 oz (60.8 kg) (05/08 0433) Last BM Date: 11/22/13 General:  Alert, Well-developed, in NAD Heart:  Regular rate and rhythm; no murmurs Pulm:  CTAB.  No W/R/R. Abdomen:  Soft, non-distended. Normal bowel sounds.  Mild epigastric TTP without R/R/G. Extremities:  Without edema. Neurologic:  Alert and  oriented x4;  grossly normal neurologically. Psych:  Alert and cooperative. Normal mood and affect.  Intake/Output from previous day: 05/07 0701 - 05/08 0700 In: 3075 [P.O.:480; I.V.:2595] Out: 2250 [Urine:2250] Intake/Output this shift: Total I/O In: 240 [P.O.:240] Out: 550 [Urine:550]  Lab Results:  Recent Labs  11/22/13 2300 11/23/13 0445 11/24/13 0940  WBC 2.2* 2.1* 2.6*  HGB 7.8* 7.6* 7.5*  HCT 23.7* 23.2* 23.5*  PLT 149* 138* 184   BMET  Recent Labs  11/22/13 2300 11/23/13 0445  NA 142 144  K 4.3 3.9  CL 104 107  CO2 25 26  GLUCOSE 102* 82  BUN 10 10  CREATININE 0.52 0.46*  CALCIUM 8.3* 7.7*   LFT  Recent Labs  11/23/13 0445  PROT 5.6*  ALBUMIN 3.2*  AST 92*  ALT 31  ALKPHOS 102  BILITOT 0.5   PT/INR  Recent Labs  11/23/13 0445  LABPROT 13.8  INR 1.08   Dg Abd Portable 2v  11/23/2013   CLINICAL DATA:  Abdominal pain.  EXAM: PORTABLE ABDOMEN - 2 VIEW  COMPARISON:  10/28/2013  FINDINGS: The bowel gas pattern is negative for obstruction. There is no evidence of free air. No radio-opaque calculi or other significant radiographic abnormality is seen.   IMPRESSION: Negative.   Electronically Signed   By: Jorje Guild M.D.   On: 11/23/2013 05:36    Assessment / Plan: -MWT x 2 as seen on EGD 5/7 as the cause of UGIB/hematemesis.  Biopsies of gastritis pending. -Acute on chronic anemia: Acutely secondary to blood loss.  Symptomatic with drop in Hgb from 10.2 grams to 7.8 grams in one week.  Hgb stable today at 7/5 grams.  ? If she should get a unit of PRBC's prior to discharge due to complaints of lightheadedness.  *Will advance diet to soft.  If she tolerates this then she can likely be discharged later this afternoon/evening. *Needs daily PPI upon discharge (will change to pantoprazole 40 mg PO) along with anti-emetics prn.    LOS: 2 days   Laban Emperor. Myrikal Messmer  11/24/2013, 10:29 AM  Pager number [phone]

## 2013-11-24 NOTE — Progress Notes (Signed)
No change in assessment. Continue plan of care. Kristen Roberson Jay Haskew

## 2013-11-25 ENCOUNTER — Encounter (HOSPITAL_COMMUNITY): Payer: Self-pay | Admitting: Emergency Medicine

## 2013-11-25 ENCOUNTER — Inpatient Hospital Stay (HOSPITAL_COMMUNITY)
Admission: EM | Admit: 2013-11-25 | Discharge: 2013-11-27 | DRG: 369 | Disposition: A | Discharge status: Home / Self Care | Payer: Self-pay | Attending: Internal Medicine | Admitting: Internal Medicine

## 2013-11-25 ENCOUNTER — Emergency Department (HOSPITAL_COMMUNITY): Payer: Self-pay

## 2013-11-25 DIAGNOSIS — K226 Gastro-esophageal laceration-hemorrhage syndrome: Principal | ICD-10-CM | POA: Diagnosis present

## 2013-11-25 DIAGNOSIS — M4120 Other idiopathic scoliosis, site unspecified: Secondary | ICD-10-CM | POA: Diagnosis present

## 2013-11-25 DIAGNOSIS — Z833 Family history of diabetes mellitus: Secondary | ICD-10-CM

## 2013-11-25 DIAGNOSIS — F329 Major depressive disorder, single episode, unspecified: Secondary | ICD-10-CM | POA: Diagnosis present

## 2013-11-25 DIAGNOSIS — F411 Generalized anxiety disorder: Secondary | ICD-10-CM | POA: Diagnosis present

## 2013-11-25 DIAGNOSIS — K920 Hematemesis: Secondary | ICD-10-CM

## 2013-11-25 DIAGNOSIS — D649 Anemia, unspecified: Secondary | ICD-10-CM

## 2013-11-25 DIAGNOSIS — Z23 Encounter for immunization: Secondary | ICD-10-CM

## 2013-11-25 DIAGNOSIS — R109 Unspecified abdominal pain: Secondary | ICD-10-CM

## 2013-11-25 DIAGNOSIS — R112 Nausea with vomiting, unspecified: Secondary | ICD-10-CM

## 2013-11-25 DIAGNOSIS — D62 Acute posthemorrhagic anemia: Secondary | ICD-10-CM | POA: Diagnosis present

## 2013-11-25 DIAGNOSIS — F3289 Other specified depressive episodes: Secondary | ICD-10-CM | POA: Diagnosis present

## 2013-11-25 DIAGNOSIS — K760 Fatty (change of) liver, not elsewhere classified: Secondary | ICD-10-CM

## 2013-11-25 DIAGNOSIS — K649 Unspecified hemorrhoids: Secondary | ICD-10-CM | POA: Diagnosis present

## 2013-11-25 DIAGNOSIS — K7689 Other specified diseases of liver: Secondary | ICD-10-CM

## 2013-11-25 DIAGNOSIS — N946 Dysmenorrhea, unspecified: Secondary | ICD-10-CM

## 2013-11-25 DIAGNOSIS — Z823 Family history of stroke: Secondary | ICD-10-CM

## 2013-11-25 DIAGNOSIS — F419 Anxiety disorder, unspecified: Secondary | ICD-10-CM

## 2013-11-25 LAB — URINALYSIS, ROUTINE W REFLEX MICROSCOPIC
Bilirubin Urine: NEGATIVE
Glucose, UA: NEGATIVE mg/dL
Ketones, ur: NEGATIVE mg/dL
Leukocytes, UA: NEGATIVE
NITRITE: NEGATIVE
PH: 5.5 (ref 5.0–8.0)
Protein, ur: NEGATIVE mg/dL
SPECIFIC GRAVITY, URINE: 1.005 (ref 1.005–1.030)
UROBILINOGEN UA: 0.2 mg/dL (ref 0.0–1.0)

## 2013-11-25 LAB — COMPREHENSIVE METABOLIC PANEL
ALBUMIN: 3.7 g/dL (ref 3.5–5.2)
ALT: 29 U/L (ref 0–35)
AST: 73 U/L — ABNORMAL HIGH (ref 0–37)
Alkaline Phosphatase: 120 U/L — ABNORMAL HIGH (ref 39–117)
BILIRUBIN TOTAL: 0.9 mg/dL (ref 0.3–1.2)
CALCIUM: 8.8 mg/dL (ref 8.4–10.5)
CHLORIDE: 99 meq/L (ref 96–112)
CO2: 20 mEq/L (ref 19–32)
Creatinine, Ser: 0.51 mg/dL (ref 0.50–1.10)
GFR calc Af Amer: >90 mL/min (ref >90)
GFR calc non Af Amer: >90 mL/min (ref >90)
Glucose, Bld: 109 mg/dL — ABNORMAL HIGH (ref 70–99)
Potassium: 3.9 mEq/L (ref 3.7–5.3)
Sodium: 136 mEq/L — ABNORMAL LOW (ref 137–147)
Total Protein: 6.8 g/dL (ref 6.0–8.3)

## 2013-11-25 LAB — TYPE AND SCREEN
ABO/RH(D): O POS
Antibody Screen: NEGATIVE
Unit division: 0

## 2013-11-25 LAB — CBC
HEMATOCRIT: 29.8 % — AB (ref 36.0–46.0)
Hemoglobin: 9.3 g/dL — ABNORMAL LOW (ref 12.0–15.0)
MCH: 30.8 pg (ref 26.0–34.0)
MCHC: 31.2 g/dL (ref 30.0–36.0)
MCV: 98.7 fL (ref 78.0–100.0)
Platelets: 312 10*3/uL (ref 150–400)
RBC: 3.02 MIL/uL — ABNORMAL LOW (ref 3.87–5.11)
RDW: 17.4 % — ABNORMAL HIGH (ref 11.5–15.5)
WBC: 3 10*3/uL — AB (ref 4.0–10.5)

## 2013-11-25 LAB — URINE MICROSCOPIC-ADD ON

## 2013-11-25 LAB — POC URINE PREG, ED: PREG TEST UR: NEGATIVE

## 2013-11-25 LAB — ABO/RH: ABO/RH(D): O POS

## 2013-11-25 MED ORDER — HYDROMORPHONE HCL PF 1 MG/ML IJ SOLN
0.5000 mg | INTRAMUSCULAR | Status: DC | PRN
  Administered 2013-11-26 (×2): 1 mg via INTRAVENOUS
  Administered 2013-11-26: 0.5 mg via INTRAVENOUS
  Administered 2013-11-26 – 2013-11-27 (×5): 1 mg via INTRAVENOUS
  Filled 2013-11-25 (×8): qty 1

## 2013-11-25 MED ORDER — HYDROMORPHONE HCL PF 1 MG/ML IJ SOLN
0.5000 mg | Freq: Once | INTRAMUSCULAR | Status: AC
  Administered 2013-11-25: 0.5 mg via INTRAVENOUS
  Filled 2013-11-25: qty 1

## 2013-11-25 MED ORDER — ONDANSETRON HCL 4 MG/2ML IJ SOLN: 4.0000 mg | Freq: Four times a day (QID) | INTRAMUSCULAR | Status: DC | PRN

## 2013-11-25 MED ORDER — HYDROMORPHONE HCL PF 1 MG/ML IJ SOLN
0.5000 mg | INTRAMUSCULAR | Status: DC | PRN
  Administered 2013-11-25: 0.5 mg via INTRAVENOUS
  Filled 2013-11-25: qty 1

## 2013-11-25 MED ORDER — ONDANSETRON HCL 4 MG/2ML IJ SOLN
4.0000 mg | Freq: Once | INTRAMUSCULAR | Status: AC
  Administered 2013-11-25: 4 mg via INTRAVENOUS
  Filled 2013-11-25: qty 2

## 2013-11-25 MED ORDER — PANTOPRAZOLE SODIUM 40 MG IV SOLR: 40.0000 mg | Freq: Two times a day (BID) | INTRAVENOUS | Status: DC

## 2013-11-25 MED ORDER — PANTOPRAZOLE SODIUM 40 MG IV SOLR
40.0000 mg | Freq: Two times a day (BID) | INTRAVENOUS | Status: DC
  Administered 2013-11-25 – 2013-11-26 (×2): 40 mg via INTRAVENOUS
  Filled 2013-11-25 (×3): qty 40

## 2013-11-25 MED ORDER — PANTOPRAZOLE SODIUM 40 MG IV SOLR
40.0000 mg | Freq: Once | INTRAVENOUS | Status: AC
  Administered 2013-11-25: 40 mg via INTRAVENOUS
  Filled 2013-11-25: qty 40

## 2013-11-25 MED ORDER — FENTANYL CITRATE 0.05 MG/ML IJ SOLN
50.0000 ug | Freq: Once | INTRAMUSCULAR | Status: AC
  Administered 2013-11-25: 50 ug via INTRAVENOUS
  Filled 2013-11-25: qty 2

## 2013-11-25 MED ORDER — SODIUM CHLORIDE 0.9 % IV SOLN
INTRAVENOUS | Status: DC
  Administered 2013-11-25: 20:00:00 via INTRAVENOUS
  Administered 2013-11-26: 75 mL/h via INTRAVENOUS

## 2013-11-25 MED ORDER — HYDROMORPHONE HCL PF 1 MG/ML IJ SOLN
1.0000 mg | Freq: Once | INTRAMUSCULAR | Status: AC
  Administered 2013-11-25: 1 mg via INTRAVENOUS
  Filled 2013-11-25: qty 1

## 2013-11-25 MED ORDER — SODIUM CHLORIDE 0.9 % IV BOLUS (SEPSIS)
1000.0000 mL | Freq: Once | INTRAVENOUS | Status: AC
  Administered 2013-11-25: 1000 mL via INTRAVENOUS

## 2013-11-25 MED ORDER — SENNA 8.6 MG PO TABS: 1.0000 | ORAL_TABLET | Freq: Every day | ORAL | Status: DC | PRN

## 2013-11-25 NOTE — ED Notes (Signed)
Pt ambulated to restroom. Pt states she has already given urine sample

## 2013-11-25 NOTE — ED Notes (Signed)
Pt is requesting to have no further visitors at this time

## 2013-11-25 NOTE — ED Notes (Signed)
Pt was ambulated to restroom but didn't collect a sample. Dr. Gwenyth Bouillon was made aware.

## 2013-11-25 NOTE — ED Provider Notes (Signed)
CSN: [id number]     Arrival date & time 11/25/13  1354 History   First MD Initiated Contact with Patient 11/25/13 1503     Chief Complaint  Patient presents with  . Abdominal Pain  . Hematemesis     (Consider location/radiation/quality/duration/timing/severity/associated sxs/prior Treatment) HPI  Complains of upper abdominal pain and hematemesis since leaving the hospital yesterday. Patient was discharged from here yesterday where she underwent endoscopy determined to have upper GI bleed secondary to Mallory-Weiss tear and or gastritis. Since discharge pain has become more severe she's vomited twice, gross blood. Abdominal pain severe upper portion. Abdomen looks distended her. No fever no other associated symptoms. No treatment prior to coming here. She's vomited twice after attempting to eat or drink Past Medical History  Diagnosis Date  . Proctitis   . Cysts of both ovaries   . Seizures   . Anemia   . Anxiety   . Blood transfusion without reported diagnosis   . Depression   . Fatty liver 10/05/13   Past Surgical History  Procedure Laterality Date  . Ovarian cyst removal    . Laparoscopy N/A 09/28/2013    Procedure: LAPAROSCOPY OPERATIVE;  Surgeon: Terrance Mass, MD;  Location: [facility];  Service: Gynecology;  Laterality: N/A;  . Laparoscopic appendectomy Right 09/28/2013    Procedure: APPENDECTOMY LAPAROSCOPIC;  Surgeon: Terrance Mass, MD;  Location: [facility];  Service: Gynecology;  Laterality: Right;  . Salpingoophorectomy Right 09/28/2013    Procedure: SALPINGO OOPHORECTOMY;  Surgeon: Terrance Mass, MD;  Location: [facility];  Service: Gynecology;  Laterality: Right;  . Colonoscopy N/A 09/30/2013    Procedure: COLONOSCOPY;  Surgeon: Lafayette Dragon, MD;  Location: [facility];  Service: Endoscopy;  Laterality: N/A;  . Esophagogastroduodenoscopy N/A 11/23/2013    Procedure: ESOPHAGOGASTRODUODENOSCOPY (EGD);  Surgeon: Jerene Bears, MD;  Location: [facility];  Service: Endoscopy;   Laterality: N/A;   Family History  Problem Relation Age of Onset  . Diabetes Mother   . Hyperlipidemia Mother   . Stroke Mother   . Diabetes Father    History  Substance Use Topics  . Smoking status: Never Smoker   . Smokeless tobacco: Not on file  . Alcohol Use: 2.4 oz/week    4 Glasses of wine per week     Comment: last had 2 days   no illicit drug use. Last alcohol a few days ago. OB History   Grav Para Term Preterm Abortions TAB SAB Ect Mult Living   7 3   4  4   3      Review of Systems  Gastrointestinal: Positive for vomiting, abdominal pain and abdominal distention.       Hematemesis  Genitourinary:       Last menstrual period 3 months ago  All other systems reviewed and are negative.     Allergies  Morphine and related; Tramadol; and Penicillins  Home Medications   Prior to Admission medications   Medication Sig Start Date End Date Taking? Authorizing Provider  oxyCODONE (OXY IR/ROXICODONE) 5 MG immediate release tablet Take 1 tablet (5 mg total) by mouth every 4 (four) hours as needed for severe pain. 11/24/13   Erline Hau, MD  pantoprazole (PROTONIX) 40 MG tablet Take 1 tablet (40 mg total) by mouth daily. 11/24/13   Erline Hau, MD   BP 109/74  Pulse 101  Temp(Src) 98.9 F (37.2 C) (Oral)  Resp 24  SpO2 96% Physical Exam  Nursing note and  vitals reviewed. Constitutional: She appears well-developed and well-nourished.  HENT:  Head: Normocephalic and atraumatic.  Eyes: Conjunctivae are normal. Pupils are equal, round, and reactive to light.  Neck: Neck supple. No tracheal deviation present. No thyromegaly present.  Cardiovascular: Regular rhythm.   No murmur heard. Tachycardic  Pulmonary/Chest: Effort normal and breath sounds normal.  Abdominal: Soft. Bowel sounds are normal. She exhibits distension. There is tenderness.  Tender left upper quadrant  Musculoskeletal: Normal range of motion. She exhibits no edema and no  tenderness.  Neurological: She is alert. Coordination normal.  Skin: Skin is warm and dry. No rash noted.  Psychiatric: She has a normal mood and affect.    ED Course  Procedures (including critical care time) Labs Review Labs Reviewed  CBC - Abnormal; Notable for the following:    WBC 3.0 (*)    RBC 3.02 (*)    Hemoglobin 9.3 (*)    HCT 29.8 (*)    RDW 17.4 (*)    All other components within normal limits  COMPREHENSIVE METABOLIC PANEL  TYPE AND SCREEN    Imaging Review No results found.   EKG Interpretation None     Results for orders placed during the hospital encounter of 11/25/13  CBC      Result Value Ref Range   WBC 3.0 (*) 4.0 - 10.5 K/uL   RBC 3.02 (*) 3.87 - 5.11 MIL/uL   Hemoglobin 9.3 (*) 12.0 - 15.0 g/dL   HCT 29.8 (*) 36.0 - 46.0 %   MCV 98.7  78.0 - 100.0 fL   MCH 30.8  26.0 - 34.0 pg   MCHC 31.2  30.0 - 36.0 g/dL   RDW 17.4 (*) 11.5 - 15.5 %   Platelets 312  150 - 400 K/uL  COMPREHENSIVE METABOLIC PANEL      Result Value Ref Range   Sodium 136 (*) 137 - 147 mEq/L   Potassium 3.9  3.7 - 5.3 mEq/L   Chloride 99  96 - 112 mEq/L   CO2 20  19 - 32 mEq/L   Glucose, Bld 109 (*) 70 - 99 mg/dL   BUN <3 (*) 6 - 23 mg/dL   Creatinine, Ser 0.51  0.50 - 1.10 mg/dL   Calcium 8.8  8.4 - 10.5 mg/dL   Total Protein 6.8  6.0 - 8.3 g/dL   Albumin 3.7  3.5 - 5.2 g/dL   AST 73 (*) 0 - 37 U/L   ALT 29  0 - 35 U/L   Alkaline Phosphatase 120 (*) 39 - 117 U/L   Total Bilirubin 0.9  0.3 - 1.2 mg/dL   GFR calc non Af Amer >90  >90 mL/min   GFR calc Af Amer >90  >90 mL/min  TYPE AND SCREEN      Result Value Ref Range   ABO/RH(D) O POS     Antibody Screen NEG     Sample Expiration 11/28/2013     Unit Number [id number]     Blood Component Type RED CELLS,LR     Unit division 00     Status of Unit REL FROM [facility]     Transfusion Status OK TO TRANSFUSE     Crossmatch Result Compatible    ABO/RH      Result Value Ref Range   ABO/RH(D) O POS     Ct Abdomen  Pelvis W Contrast  10/28/2013   CLINICAL DATA:  Lower abdominal pain. Swelling in the legs. Nausea, vomiting, diarrhea. Previous appendectomy and right ovary  removed 1 month ago.  EXAM: CT ABDOMEN AND PELVIS WITH CONTRAST  TECHNIQUE: Multidetector CT imaging of the abdomen and pelvis was performed using the standard protocol following bolus administration of intravenous contrast.  CONTRAST:  73mL OMNIPAQUE IOHEXOL 300 MG/ML  SOLN  COMPARISON:  10/05/2013.  09/17/2013.  FINDINGS: Motion artifact limits visualization of the lung bases. No focal infiltration identified.  Diffuse fatty infiltration of the liver. No focal lesions. The gallbladder, pancreas, spleen, adrenal glands, kidneys, abdominal aorta, inferior vena cava, and retroperitoneal lymph nodes are unremarkable. The stomach and small bowel are decompressed. Colon is decompressed. No free air or free fluid in the abdomen.  Pelvis: Small amount of free fluid in the right pelvis is likely physiologic or possibly postoperative. Uterus is anteverted without enlargement. No abnormal adnexal masses. The appendix is surgically absent by history with surgical clips present in the right lower quadrant. The colon is decompressed and therefore cannot be effectively evaluated for wall thickening. No extra colonic infiltration or abscess. Bladder wall is not thickened. No significant pelvic lymphadenopathy. Spondylolysis with mild spondylolisthesis at L5-S1. No destructive bone lesions appreciated.  IMPRESSION: No acute process demonstrated in the abdomen or pelvis. Small amount of free fluid in the pelvis is likely physiologic. Diffuse fatty infiltration of the liver   Electronically Signed   By: Lucienne Capers M.D.   On: 10/28/2013 21:56   Dg Abd Acute W/chest  11/25/2013   CLINICAL DATA:  Abdominal pain.  Nausea and vomiting.  EXAM: ACUTE ABDOMEN SERIES (ABDOMEN 2 VIEW & CHEST 1 VIEW)  COMPARISON:  DG ABD PORTABLE 2V dated 11/23/2013; CT ABD/PELVIS W CM dated  10/28/2013; CT ABD/PELVIS W CM dated 10/05/2013  FINDINGS: Bowel gas pattern unremarkable without evidence of obstruction or significant ileus. Expected stool burden in the colon. No evidence of free air on the erect image. Air-fluid levels in the ascending colon consistent liquid stool. No abnormal calcifications. Apparent scoliosis is felt to be positional.  Cardiomediastinal silhouette unremarkable. Lungs clear. Bronchovascular markings normal. Pulmonary vascularity normal. No visible pleural effusions. No pneumothorax.  IMPRESSION: No acute abdominal or pulmonary abnormality.   Electronically Signed   By: Evangeline Dakin M.D.   On: 11/25/2013 15:59   Dg Abd Portable 2v  11/23/2013   CLINICAL DATA:  Abdominal pain.  EXAM: PORTABLE ABDOMEN - 2 VIEW  COMPARISON:  10/28/2013  FINDINGS: The bowel gas pattern is negative for obstruction. There is no evidence of free air. No radio-opaque calculi or other significant radiographic abnormality is seen.  IMPRESSION: Negative.   Electronically Signed   By: Jorje Guild M.D.   On: 11/23/2013 05:36   xrays viewed by me  420 pm mpt more co36mfortably after treatment with iv fluids, opiods, antiemetics and ppi MDM  Spoke with Dr.Pyrtle to suggest bowel rest, protonx 40 mg IV twice daily. He will consult. Request admission to hospital service. Spoke with Dr. Broadus John plan admit medical surgical floor Diagnosis #1 abdominal pain #2 hematemesis #3 anemia Final diagnoses:  None        Orlie Dakin, MD 11/25/13 1624

## 2013-11-25 NOTE — ED Notes (Signed)
Pt has had only one episode of spitting up clear phlegm while in emergency room

## 2013-11-25 NOTE — ED Notes (Signed)
Pt reports being d/c last night after 3 day admission. EGD yesterday, found to have Mallory-Weiss tear. Pt reports 2 tears. Tried to eat cheerios last night and began vomiting blood and having severe abd pain.

## 2013-11-25 NOTE — H&P (Signed)
[facility] History and Physical  Kristen Roberson ZWC:[id number] DOB: Dec 17, 1975 DOA: 11/25/2013  Referring physician: EDP PCP: No PCP Per Patient   Chief Complaint: nausea/vomiting/hematemesis  HPI: Kristen Roberson is a 38 y.o. female with PMH of Anxiety/Depression, was just discharged from [facility] yesterday after admission for hematemesis, EGD 5/7 showed Mallory weiss tear and was sent home on PPI BID. She reports going home and having 3 episodes of vomiting following anything she tried to eat with small amount of hematemesis. She also reports an episode of large volume hematemesis after attempted breakfast this am which prompted her to come to the ER. In ER, Vitals stable, Hb 9.3, was 7.5 at discharge. EDP D/w Dr.Pyrtle GI who recommended observation, PPI and supportive care    Review of Systems:  Constitutional:  No weight loss, night sweats, Fevers, chills, fatigue.  HEENT:  No headaches, Difficulty swallowing,Tooth/dental problems,Sore throat,  No sneezing, itching, ear ache, nasal congestion, post nasal drip,  Cardio-vascular:  No chest pain, Orthopnea, PND, swelling in lower extremities, anasarca, dizziness, palpitations  GI:  No heartburn, indigestion, abdominal pain, nausea, vomiting, diarrhea, change in bowel habits, loss of appetite  Resp:  No shortness of breath with exertion or at rest. No excess mucus, no productive cough, No non-productive cough, No coughing up of blood.No change in color of mucus.No wheezing.No chest wall deformity  Skin:  no rash or lesions.  GU:  no dysuria, change in color of urine, no urgency or frequency. No flank pain.  Musculoskeletal:  No joint pain or swelling. No decreased range of motion. No back pain.  Psych:  No change in mood or affect. No depression or anxiety. No memory loss.   Past Medical History  Diagnosis Date  . Proctitis   . Cysts of both ovaries   . Seizures   . Anemia   . Anxiety   . Blood transfusion without reported  diagnosis   . Depression   . Fatty liver 10/05/13   Past Surgical History  Procedure Laterality Date  . Ovarian cyst removal    . Laparoscopy N/A 09/28/2013    Procedure: LAPAROSCOPY OPERATIVE;  Surgeon: Terrance Mass, MD;  Location: [facility];  Service: Gynecology;  Laterality: N/A;  . Laparoscopic appendectomy Right 09/28/2013    Procedure: APPENDECTOMY LAPAROSCOPIC;  Surgeon: Terrance Mass, MD;  Location: [facility];  Service: Gynecology;  Laterality: Right;  . Salpingoophorectomy Right 09/28/2013    Procedure: SALPINGO OOPHORECTOMY;  Surgeon: Terrance Mass, MD;  Location: [facility];  Service: Gynecology;  Laterality: Right;  . Colonoscopy N/A 09/30/2013    Procedure: COLONOSCOPY;  Surgeon: Lafayette Dragon, MD;  Location: [facility];  Service: Endoscopy;  Laterality: N/A;  . Esophagogastroduodenoscopy N/A 11/23/2013    Procedure: ESOPHAGOGASTRODUODENOSCOPY (EGD);  Surgeon: Jerene Bears, MD;  Location: [facility];  Service: Endoscopy;  Laterality: N/A;   Social History:  reports that she has never smoked. She does not have any smokeless tobacco history on file. She reports that she drinks about 2.4 ounces of alcohol per week. She reports that she does not use illicit drugs.  Allergies  Allergen Reactions  . Morphine And Related Anaphylaxis        . Tramadol Other (See Comments)    Seizures   . Penicillins Other (See Comments)    Unknown childhood reaction.    Family History  Problem Relation Age of Onset  . Diabetes Mother   . Hyperlipidemia Mother   . Stroke Mother   .  Diabetes Father      Prior to Admission medications   Medication Sig Start Date End Date Taking? Authorizing Provider  oxyCODONE (OXY IR/ROXICODONE) 5 MG immediate release tablet Take 1 tablet (5 mg total) by mouth every 4 (four) hours as needed for severe pain. 11/24/13   Erline Hau, MD  pantoprazole (PROTONIX) 40 MG tablet Take 1 tablet (40 mg total) by mouth daily. 11/24/13   Erline Hau, MD   Physical Exam: Filed Vitals:   11/25/13 1600  BP: 118/73  Pulse: 85  Temp:   Resp:     BP 118/73  Pulse 85  Temp(Src) 98.9 F (37.2 C) (Oral)  Resp 24  SpO2 96%  LMP 09/25/2013  General:  Appears calm and comfortable Eyes: PERRL, normal lids, irises & conjunctiva ENT: grossly normal hearing, lips & tongue Neck: no LAD, masses or thyromegaly Cardiovascular: RRR, no m/r/g. No LE edema. Respiratory: CTA bilaterally, no w/r/r. Normal respiratory effort. Abdomen: soft, mild L sided tenderness, ND, BS present Skin: no rash or induration seen on limited exam Musculoskeletal: grossly normal tone BUE/BLE Psychiatric: grossly normal mood and affect, speech fluent and appropriate Neurologic: grossly non-focal.          Labs on Admission:  Basic Metabolic Panel:  Recent Labs Lab 11/22/13 2300 11/23/13 0445 11/25/13 1425  NA 142 144 136*  K 4.3 3.9 3.9  CL 104 107 99  CO2 25 26 20   GLUCOSE 102* 82 109*  BUN 10 10 <3*  CREATININE 0.52 0.46* 0.51  CALCIUM 8.3* 7.7* 8.8   Liver Function Tests:  Recent Labs Lab 11/22/13 2300 11/23/13 0445 11/25/13 1425  AST 108* 92* 73*  ALT 34 31 29  ALKPHOS 110 102 120*  BILITOT 0.5 0.5 0.9  PROT 5.8* 5.6* 6.8  ALBUMIN 3.2* 3.2* 3.7    Recent Labs Lab 11/22/13 2300  LIPASE 32   No results found for this basename: AMMONIA,  in the last 168 hours CBC:  Recent Labs Lab 11/22/13 2300 11/23/13 0445 11/24/13 0940 11/25/13 1425  WBC 2.2* 2.1* 2.6* 3.0*  NEUTROABS 1.2*  --   --   --   HGB 7.8* 7.6* 7.5* 9.3*  HCT 23.7* 23.2* 23.5* 29.8*  MCV 96.7 97.5 99.6 98.7  PLT 149* 138* 184 312   Cardiac Enzymes: No results found for this basename: CKTOTAL, CKMB, CKMBINDEX, TROPONINI,  in the last 168 hours  BNP (last 3 results) No results found for this basename: PROBNP,  in the last 8760 hours CBG: No results found for this basename: GLUCAP,  in the last 168 hours  Radiological Exams on Admission: Dg Abd  Acute W/chest  11/25/2013   CLINICAL DATA:  Abdominal pain.  Nausea and vomiting.  EXAM: ACUTE ABDOMEN SERIES (ABDOMEN 2 VIEW & CHEST 1 VIEW)  COMPARISON:  DG ABD PORTABLE 2V dated 11/23/2013; CT ABD/PELVIS W CM dated 10/28/2013; CT ABD/PELVIS W CM dated 10/05/2013  FINDINGS: Bowel gas pattern unremarkable without evidence of obstruction or significant ileus. Expected stool burden in the colon. No evidence of free air on the erect image. Air-fluid levels in the ascending colon consistent liquid stool. No abnormal calcifications. Apparent scoliosis is felt to be positional.  Cardiomediastinal silhouette unremarkable. Lungs clear. Bronchovascular markings normal. Pulmonary vascularity normal. No visible pleural effusions. No pneumothorax.  IMPRESSION: No acute abdominal or pulmonary abnormality.   Electronically Signed   By: Evangeline Dakin M.D.   On: 11/25/2013 15:59    Assessment/Plan  1. Nausea/Vomiting/hematemesis -  12 ER visits in 59months due to abd pain - Numerous normal CTs, EGD 5/7 with Mallory Weiss tear - Colonoscopy normal, has hemorrhoids which bleed intermittently - No indication for repeat EGD at this time - Supportive care, IV PPI, clear liquids advance as tolerated  2. Anemia -iron defi anemia, acute blood loss anemia -transfuse PRN  3. Anxiety/Depression -suspect contributing to some of her symptoms  DVT proph: SCDs  Code Status: Full Code Family Communication: d/w pt and friend at bedside Disposition Plan: home tomorrow if improved  Time spent: 21min  Kristen Roberson [facility] Pager [phone]

## 2013-11-26 DIAGNOSIS — R112 Nausea with vomiting, unspecified: Secondary | ICD-10-CM

## 2013-11-26 DIAGNOSIS — F411 Generalized anxiety disorder: Secondary | ICD-10-CM

## 2013-11-26 DIAGNOSIS — K920 Hematemesis: Secondary | ICD-10-CM

## 2013-11-26 DIAGNOSIS — K226 Gastro-esophageal laceration-hemorrhage syndrome: Principal | ICD-10-CM

## 2013-11-26 DIAGNOSIS — R109 Unspecified abdominal pain: Secondary | ICD-10-CM

## 2013-11-26 DIAGNOSIS — D649 Anemia, unspecified: Secondary | ICD-10-CM

## 2013-11-26 LAB — CBC
HEMATOCRIT: 24.2 % — AB (ref 36.0–46.0)
HEMOGLOBIN: 7.7 g/dL — AB (ref 12.0–15.0)
MCH: 31.7 pg (ref 26.0–34.0)
MCHC: 31.8 g/dL (ref 30.0–36.0)
MCV: 99.6 fL (ref 78.0–100.0)
Platelets: 275 10*3/uL (ref 150–400)
RBC: 2.43 MIL/uL — AB (ref 3.87–5.11)
RDW: 17.1 % — ABNORMAL HIGH (ref 11.5–15.5)
WBC: 3 10*3/uL — ABNORMAL LOW (ref 4.0–10.5)

## 2013-11-26 LAB — COMPREHENSIVE METABOLIC PANEL
ALT: 22 U/L (ref 0–35)
AST: 55 U/L — ABNORMAL HIGH (ref 0–37)
Albumin: 2.8 g/dL — ABNORMAL LOW (ref 3.5–5.2)
Alkaline Phosphatase: 89 U/L (ref 39–117)
BILIRUBIN TOTAL: 0.9 mg/dL (ref 0.3–1.2)
BUN: 3 mg/dL — ABNORMAL LOW (ref 6–23)
CHLORIDE: 106 meq/L (ref 96–112)
CO2: 24 meq/L (ref 19–32)
CREATININE: 0.47 mg/dL — AB (ref 0.50–1.10)
Calcium: 7.8 mg/dL — ABNORMAL LOW (ref 8.4–10.5)
GLUCOSE: 118 mg/dL — AB (ref 70–99)
Potassium: 4.1 mEq/L (ref 3.7–5.3)
Sodium: 138 mEq/L (ref 137–147)
Total Protein: 5.2 g/dL — ABNORMAL LOW (ref 6.0–8.3)

## 2013-11-26 MED ORDER — PANTOPRAZOLE SODIUM 40 MG PO TBEC
40.0000 mg | DELAYED_RELEASE_TABLET | Freq: Two times a day (BID) | ORAL | Status: DC
  Administered 2013-11-26 – 2013-11-27 (×2): 40 mg via ORAL
  Filled 2013-11-26 (×4): qty 1

## 2013-11-26 MED ORDER — ONDANSETRON 4 MG PO TBDP
4.0000 mg | ORAL_TABLET | Freq: Three times a day (TID) | ORAL | Status: DC
  Administered 2013-11-26 – 2013-11-27 (×3): 4 mg via ORAL
  Filled 2013-11-26 (×6): qty 1

## 2013-11-26 MED ORDER — SUCRALFATE 1 GM/10ML PO SUSP
1.0000 g | Freq: Three times a day (TID) | ORAL | Status: DC
  Administered 2013-11-26 (×2): 1 g via ORAL
  Filled 2013-11-26 (×7): qty 10

## 2013-11-26 NOTE — Consult Note (Signed)
Consult Note  Primary Care Physician:  Angelica Chessman, MD Primary Gastroenterologist:   Vonda Antigua MD: Domenic Polite, MD  Reason: hematemesis  HPI: Kristen Roberson is a 38 y.o. female with recent hospitalization for hematemesis found to have small Mallory-Weiss tears, who was discharged and returned after recurrent hematemesis at home. She reports she went home and was overall feeling better but sandwich and developed epigastric discomfort followed by nausea with emesis. In she reports a moderate amount of blood in her emesis associated with epigastric and substernal chest discomfort. She had not yet picked up her daily PPI.  When she returned her hemoglobin was 9.3, 7.5 at discharge. She received IV fluids and repeat hemoglobin was 7.7  She reports she is feeling better now. No further nausea, vomiting or emesis since admission. She ate tomato soup and a half of a cheeseburger for lunch and tolerated this well. She admits to stress levels at home relating to divorce and child custody issues. She reports her appetite is often decreased when she is stressed.   Past Medical History  Diagnosis Date  . Proctitis   . Cysts of both ovaries   . Seizures   . Anemia   . Anxiety   . Blood transfusion without reported diagnosis   . Depression   . Fatty liver 10/05/13    Past Surgical History  Procedure Laterality Date  . Ovarian cyst removal    . Laparoscopy N/A 09/28/2013    Procedure: LAPAROSCOPY OPERATIVE;  Surgeon: Terrance Mass, MD;  Location: [facility];  Service: Gynecology;  Laterality: N/A;  . Laparoscopic appendectomy Right 09/28/2013    Procedure: APPENDECTOMY LAPAROSCOPIC;  Surgeon: Terrance Mass, MD;  Location: [facility];  Service: Gynecology;  Laterality: Right;  . Salpingoophorectomy Right 09/28/2013    Procedure: SALPINGO OOPHORECTOMY;  Surgeon: Terrance Mass, MD;  Location: [facility];  Service: Gynecology;  Laterality: Right;  . Colonoscopy N/A 09/30/2013   Procedure: COLONOSCOPY;  Surgeon: Lafayette Dragon, MD;  Location: [facility];  Service: Endoscopy;  Laterality: N/A;  . Esophagogastroduodenoscopy N/A 11/23/2013    Procedure: ESOPHAGOGASTRODUODENOSCOPY (EGD);  Surgeon: Jerene Bears, MD;  Location: [facility];  Service: Endoscopy;  Laterality: N/A;    Prior to Admission medications   Medication Sig Start Date End Date Taking? Authorizing Provider  oxyCODONE (OXY IR/ROXICODONE) 5 MG immediate release tablet Take 1 tablet (5 mg total) by mouth every 4 (four) hours as needed for severe pain. 11/24/13   Erline Hau, MD  pantoprazole (PROTONIX) 40 MG tablet Take 1 tablet (40 mg total) by mouth daily. 11/24/13   Erline Hau, MD    Current Facility-Administered Medications  Medication Dose Route Frequency Provider Last Rate Last Dose  . 0.9 %  sodium chloride infusion   Intravenous Continuous Domenic Polite, MD 75 mL/hr at 11/25/13 1938    . HYDROmorphone (DILAUDID) injection 0.5-1 mg  0.5-1 mg Intravenous Q4H PRN Dianne Dun, NP   1 mg at 11/26/13 1455  . ondansetron (ZOFRAN) injection 4 mg  4 mg Intravenous Q6H PRN Domenic Polite, MD      . pantoprazole (PROTONIX) EC tablet 40 mg  40 mg Oral BID Bonnielee Haff, MD      . senna [facility]) tablet 8.6 mg  1 tablet Oral Daily PRN Domenic Polite, MD        Allergies as of 11/25/2013 - Review Complete 11/25/2013  Allergen Reaction Noted  . Morphine and related Anaphylaxis 09/17/2013  .  Tramadol Other (See Comments) 09/17/2013  . Penicillins Other (See Comments) 09/17/2013    Family History  Problem Relation Age of Onset  . Diabetes Mother   . Hyperlipidemia Mother   . Stroke Mother   . Diabetes Father     History   Social History  . Marital Status: Legally Separated    Spouse Name: N/A    Number of Children: N/A  . Years of Education: N/A   Occupational History  . Not on file.   Social History Main Topics  . Smoking status: Never Smoker   .  Smokeless tobacco: Not on file  . Alcohol Use: 2.4 oz/week    4 Glasses of wine per week     Comment: last had 2 days  . Drug Use: No  . Sexual Activity: Yes   Other Topics Concern  . Not on file   Social History Narrative  . No narrative on file    Review of Systems:  All systems reviewed an negative except where noted in HPI.    Physical Exam: Vital signs in last 24 hours: Temp:  [97.6 F (36.4 C)-98.8 F (37.1 C)] 98 F (36.7 C) (05/10 1459) Pulse Rate:  [67-84] 84 (05/10 1459) Resp:  [12-20] 20 (05/10 1459) BP: (106-128)/(71-87) 111/72 mmHg (05/10 1459) SpO2:  [94 %-100 %] 98 % (05/10 1459) Weight:  [123 lb 4.8 oz (55.929 kg)] 123 lb 4.8 oz (55.929 kg) (05/09 1822)   Gen: awake, alert, NAD HEENT: anicteric, op clear CV: RRR, no mrg Pulm: CTA b/l Abd: soft, mild epigastric tenderness, no rebound or guarding, +BS throughout Ext: no c/c/e Neuro: nonfocal  Lab Results:  Recent Labs  11/24/13 0940 11/25/13 1425 11/26/13 0528  WBC 2.6* 3.0* 3.0*  HGB 7.5* 9.3* 7.7*  HCT 23.5* 29.8* 24.2*  PLT 184 312 275   BMET  Recent Labs  11/25/13 1425 11/26/13 0528  NA 136* 138  K 3.9 4.1  CL 99 106  CO2 20 24  GLUCOSE 109* 118*  BUN <3* 3*  CREATININE 0.51 0.47*  CALCIUM 8.8 7.8*   LFT  Recent Labs  11/26/13 0528  PROT 5.2*  ALBUMIN 2.8*  AST 55*  ALT 22  ALKPHOS 89  BILITOT 0.9    Studies/Results: Dg Abd Acute W/chest  11/25/2013   CLINICAL DATA:  Abdominal pain.  Nausea and vomiting.  EXAM: ACUTE ABDOMEN SERIES (ABDOMEN 2 VIEW & CHEST 1 VIEW)  COMPARISON:  DG ABD PORTABLE 2V dated 11/23/2013; CT ABD/PELVIS W CM dated 10/28/2013; CT ABD/PELVIS W CM dated 10/05/2013  FINDINGS: Bowel gas pattern unremarkable without evidence of obstruction or significant ileus. Expected stool burden in the colon. No evidence of free air on the erect image. Air-fluid levels in the ascending colon consistent liquid stool. No abnormal calcifications. Apparent scoliosis is  felt to be positional.  Cardiomediastinal silhouette unremarkable. Lungs clear. Bronchovascular markings normal. Pulmonary vascularity normal. No visible pleural effusions. No pneumothorax.  IMPRESSION: No acute abdominal or pulmonary abnormality.   Electronically Signed   By: Evangeline Dakin M.D.   On: 11/25/2013 15:59    Impression / Plan: 38 year old female with history of anxiety and depression, appendectomy, ovarian cyst, bleeding hemorrhoids, and recent admission for hematemesis from Mallory-Weiss tear who returns to the hospital with hematemesis at home.  1.  Hematemesis -- symptoms of nausea and vomiting have improved. She has multiple ER visits over the last 6 months. Mallory-Weiss tear is likely the cause for her hematemesis. Hemoglobin did not declined significantly  from recent discharge. No evidence for active bleeding. Continue daily PPI. We'll schedule Zofran every 8 hours for the next week to help combat nausea. We'll also as liquid Carafate before meals and at bedtime. Soft bland diet --GI followup already scheduled  2.  Anxiety/stress -- big component to her symptoms. I have advised she consider counseling.  3.  Anemia -- stable hemoglobin, continue iron supplementation. Hgb should be monitored as an outpatient  No plans for repeat endoscopy Call with questions      LOS: 1 day   Jerene Bears  11/26/2013, 4:28 PM

## 2013-11-26 NOTE — Progress Notes (Signed)
[facility] PROGRESS NOTE  Kristen Roberson FBP:[id number] DOB: 09/06/1975 DOA: 11/25/2013  PCP: Angelica Chessman, MD Patient hasn't seen this doctor yet. She has an upcoming appointment  Brief HPI: Kristen Roberson is a 38 y.o. female with PMH of Anxiety/Depression, was just discharged from [facility] 5/8 after admission for hematemesis, EGD 5/7 showed Mallory weiss tear and was sent home on PPI BID. She reported going home and having 3 episodes of vomiting after eating and noted small amount of hematemesis. She also reported an episode of large volume hematemesis after attempted breakfast on morning of admission which prompted her to come to the ER.   Past medical history:  Past Medical History  Diagnosis Date  . Proctitis   . Cysts of both ovaries   . Seizures   . Anemia   . Anxiety   . Blood transfusion without reported diagnosis   . Depression   . Fatty liver 10/05/13    Consultants: None  Procedures: None. Recent EGD on 5/7  Antibiotics: None  Subjective: Patient feels slightly better this morning. Tolerated her breakfast. No further episodes of vomiting. Abdominal pain as before without worsening.  Objective: Vital Signs  Filed Vitals:   11/25/13 1753 11/25/13 1822 11/25/13 2150 11/26/13 0600  BP: 106/78 120/71 128/87 120/81  Pulse: 80 80 83 67  Temp: 98.1 F (36.7 C) 98.1 F (36.7 C) 98.8 F (37.1 C) 97.6 F (36.4 C)  TempSrc: Oral Oral Oral Oral  Resp: 16  12 20   Height:  5\' 6"  (1.676 m)    Weight:  55.929 kg (123 lb 4.8 oz)    SpO2: 96% 94% 100% 100%    Intake/Output Summary (Last 24 hours) at 11/26/13 1230 Last data filed at 11/26/13 0600  Gross per 24 hour  Intake  777.5 ml  Output      2 ml  Net  775.5 ml   Filed Weights   11/25/13 1822  Weight: 55.929 kg (123 lb 4.8 oz)    General appearance: alert, cooperative, appears stated age and no distress Head: Normocephalic, without obvious abnormality, atraumatic Eyes: conjunctivae/corneas clear. PERRL,  EOM's intact. Resp: clear to auscultation bilaterally Cardio: regular rate and rhythm, S1, S2 normal, no murmur, click, rub or gallop GI: Abdomen is soft. There is minimal tenderness in the epigastric area. Nonspecific discomfort throughout abdomen without any rebound, rigidity, or guarding. No masses, or organomegaly. Bowel sounds are present. Extremities: extremities normal, atraumatic, no cyanosis or edema Neurologic: No focal neurological deficits are noted  Lab Results:  Basic Metabolic Panel:  Recent Labs Lab 11/22/13 2300 11/23/13 0445 11/25/13 1425 11/26/13 0528  NA 142 144 136* 138  K 4.3 3.9 3.9 4.1  CL 104 107 99 106  CO2 25 26 20 24   GLUCOSE 102* 82 109* 118*  BUN 10 10 <3* 3*  CREATININE 0.52 0.46* 0.51 0.47*  CALCIUM 8.3* 7.7* 8.8 7.8*   Liver Function Tests:  Recent Labs Lab 11/22/13 2300 11/23/13 0445 11/25/13 1425 11/26/13 0528  AST 108* 92* 73* 55*  ALT 34 31 29 22   ALKPHOS 110 102 120* 89  BILITOT 0.5 0.5 0.9 0.9  PROT 5.8* 5.6* 6.8 5.2*  ALBUMIN 3.2* 3.2* 3.7 2.8*    Recent Labs Lab 11/22/13 2300  LIPASE 32   CBC:  Recent Labs Lab 11/22/13 2300 11/23/13 0445 11/24/13 0940 11/25/13 1425 11/26/13 0528  WBC 2.2* 2.1* 2.6* 3.0* 3.0*  NEUTROABS 1.2*  --   --   --   --  HGB 7.8* 7.6* 7.5* 9.3* 7.7*  HCT 23.7* 23.2* 23.5* 29.8* 24.2*  MCV 96.7 97.5 99.6 98.7 99.6  PLT 149* 138* 184 312 275     Studies/Results: Dg Abd Acute W/chest  11/25/2013   CLINICAL DATA:  Abdominal pain.  Nausea and vomiting.  EXAM: ACUTE ABDOMEN SERIES (ABDOMEN 2 VIEW & CHEST 1 VIEW)  COMPARISON:  DG ABD PORTABLE 2V dated 11/23/2013; CT ABD/PELVIS W CM dated 10/28/2013; CT ABD/PELVIS W CM dated 10/05/2013  FINDINGS: Bowel gas pattern unremarkable without evidence of obstruction or significant ileus. Expected stool burden in the colon. No evidence of free air on the erect image. Air-fluid levels in the ascending colon consistent liquid stool. No abnormal calcifications.  Apparent scoliosis is felt to be positional.  Cardiomediastinal silhouette unremarkable. Lungs clear. Bronchovascular markings normal. Pulmonary vascularity normal. No visible pleural effusions. No pneumothorax.  IMPRESSION: No acute abdominal or pulmonary abnormality.   Electronically Signed   By: Evangeline Dakin M.D.   On: 11/25/2013 15:59    Medications:  Scheduled: . pantoprazole  40 mg Oral BID   Continuous: . sodium chloride 75 mL/hr at 11/25/13 1938   [id number] (DILAUDID) injection, ondansetron (ZOFRAN) IV, senna  Assessment/Plan:  Active Problems:   Anxiety   Hematemesis   Abdominal pain   Nausea and vomiting   Mallory-Weiss tear    Nausea/Vomiting/hematemesis/Recent Mallor Weis tear  She is better today. Will advance diet. She has had 12 ER visits in 67months due to abd pain. She has had numerous normal CTs. EGD 5/7 showed Mallory Weiss tear. Colonoscopy in March was normal. She has hemorrhoids which bleed intermittently. No indication for repeat EGD at this time. Change to oral PPI.  Anemia  Iron deficiency anemia, acute blood loss anemia. Hgb has dropped compared to 5/9 but same as 5/8. Yesterday's level was likely heme concentrated. Will repeat in AM. Transfuse PRN   Anxiety/Depression  Suspect contributing to some of her symptoms. She reports multiple stressors in her life including recent divorce and child custody issues. Not on psyche meds.  Code Status: Full Code  DVT Prophylaxis: SCD's    Family Communication: Discussed with patient  Disposition Plan: Monitor for one more day due to drop in Hgb. DC in Am if hgb remains stable.    LOS: 1 day   [facility] Pager [phone] 11/26/2013, 12:30 PM  If 8PM-8AM, please contact night-coverage at [email or web address], password [email or web address]   Disclaimer: This note was dictated with voice recognition software. Similar sounding words can inadvertently be transcribed and may not be corrected upon  review.

## 2013-11-27 ENCOUNTER — Encounter (HOSPITAL_COMMUNITY): Payer: Self-pay | Admitting: Emergency Medicine

## 2013-11-27 ENCOUNTER — Emergency Department (HOSPITAL_COMMUNITY)
Admission: EM | Admit: 2013-11-27 | Discharge: 2013-11-28 | Discharge status: Against Medical Advice | Payer: Self-pay | Attending: Emergency Medicine | Admitting: Emergency Medicine

## 2013-11-27 DIAGNOSIS — Z9889 Other specified postprocedural states: Secondary | ICD-10-CM | POA: Insufficient documentation

## 2013-11-27 DIAGNOSIS — Z8742 Personal history of other diseases of the female genital tract: Secondary | ICD-10-CM | POA: Insufficient documentation

## 2013-11-27 DIAGNOSIS — Z8669 Personal history of other diseases of the nervous system and sense organs: Secondary | ICD-10-CM | POA: Insufficient documentation

## 2013-11-27 DIAGNOSIS — Z880 Allergy status to penicillin: Secondary | ICD-10-CM | POA: Insufficient documentation

## 2013-11-27 DIAGNOSIS — R42 Dizziness and giddiness: Secondary | ICD-10-CM | POA: Insufficient documentation

## 2013-11-27 DIAGNOSIS — Z79899 Other long term (current) drug therapy: Secondary | ICD-10-CM | POA: Insufficient documentation

## 2013-11-27 DIAGNOSIS — K226 Gastro-esophageal laceration-hemorrhage syndrome: Secondary | ICD-10-CM | POA: Insufficient documentation

## 2013-11-27 DIAGNOSIS — Z8659 Personal history of other mental and behavioral disorders: Secondary | ICD-10-CM | POA: Insufficient documentation

## 2013-11-27 DIAGNOSIS — Z862 Personal history of diseases of the blood and blood-forming organs and certain disorders involving the immune mechanism: Secondary | ICD-10-CM | POA: Insufficient documentation

## 2013-11-27 DIAGNOSIS — R1110 Vomiting, unspecified: Secondary | ICD-10-CM

## 2013-11-27 LAB — COMPREHENSIVE METABOLIC PANEL
ALBUMIN: 3.5 g/dL (ref 3.5–5.2)
ALT: 24 U/L (ref 0–35)
AST: 56 U/L — AB (ref 0–37)
Alkaline Phosphatase: 108 U/L (ref 39–117)
BILIRUBIN TOTAL: 0.4 mg/dL (ref 0.3–1.2)
BUN: 4 mg/dL — AB (ref 6–23)
CALCIUM: 8.6 mg/dL (ref 8.4–10.5)
CO2: 24 mEq/L (ref 19–32)
Chloride: 106 mEq/L (ref 96–112)
Creatinine, Ser: 0.52 mg/dL (ref 0.50–1.10)
GFR calc Af Amer: >90 mL/min (ref >90)
GFR calc non Af Amer: >90 mL/min (ref >90)
Glucose, Bld: 96 mg/dL (ref 70–99)
Potassium: 4.4 mEq/L (ref 3.7–5.3)
Sodium: 142 mEq/L (ref 137–147)
TOTAL PROTEIN: 6.7 g/dL (ref 6.0–8.3)

## 2013-11-27 LAB — BASIC METABOLIC PANEL
BUN: <3 mg/dL — ABNORMAL LOW (ref 6–23)
CO2: 23 mEq/L (ref 19–32)
Calcium: 8 mg/dL — ABNORMAL LOW (ref 8.4–10.5)
Chloride: 105 mEq/L (ref 96–112)
Creatinine, Ser: 0.41 mg/dL — ABNORMAL LOW (ref 0.50–1.10)
GFR calc Af Amer: >90 mL/min (ref >90)
GLUCOSE: 98 mg/dL (ref 70–99)
POTASSIUM: 3.8 meq/L (ref 3.7–5.3)
SODIUM: 138 meq/L (ref 137–147)

## 2013-11-27 LAB — BENZODIAZEPINE, QUANTITATIVE, URINE
Alprazolam metabolite (GC/LC/MS), ur confirm: NEGATIVE ng/mL (ref <25)
Clonazepam metabolite (GC/LC/MS), ur confirm: NEGATIVE ng/mL (ref <25)
Flurazepam GC/MS Conf: NEGATIVE ng/mL (ref <50)
LORAZEPAMU: NEGATIVE ng/mL (ref <50)
MIDAZOLAMU: 1527 ng/mL — AB (ref <50)
NORDIAZEPAM GC/MS CONF: NEGATIVE ng/mL (ref <50)
OXAZEPAM GC/MS CONF: NEGATIVE ng/mL (ref <50)
Temazepam GC/MS Conf: NEGATIVE ng/mL (ref <50)
Triazolam metabolite (GC/LC/MS), ur confirm: NEGATIVE ng/mL (ref <50)

## 2013-11-27 LAB — CBC
HCT: 24.4 % — ABNORMAL LOW (ref 36.0–46.0)
Hemoglobin: 7.8 g/dL — ABNORMAL LOW (ref 12.0–15.0)
MCH: 31.5 pg (ref 26.0–34.0)
MCHC: 32 g/dL (ref 30.0–36.0)
MCV: 98.4 fL (ref 78.0–100.0)
PLATELETS: 293 10*3/uL (ref 150–400)
RBC: 2.48 MIL/uL — AB (ref 3.87–5.11)
RDW: 16.5 % — ABNORMAL HIGH (ref 11.5–15.5)
WBC: 2.3 10*3/uL — ABNORMAL LOW (ref 4.0–10.5)

## 2013-11-27 LAB — LIPASE, BLOOD: Lipase: 22 U/L (ref 11–59)

## 2013-11-27 MED ORDER — SUCRALFATE 1 GM/10ML PO SUSP: 1.0000 g | Freq: Three times a day (TID) | ORAL | Status: DC

## 2013-11-27 MED ORDER — ONDANSETRON HCL 4 MG/2ML IJ SOLN
4.0000 mg | Freq: Once | INTRAMUSCULAR | Status: DC
  Filled 2013-11-27: qty 2

## 2013-11-27 MED ORDER — ONDANSETRON 4 MG PO TBDP: 4.0000 mg | ORAL_TABLET | Freq: Three times a day (TID) | ORAL | Status: DC

## 2013-11-27 NOTE — ED Notes (Signed)
Pt was discharged from the hospital this morning, her friend came back to her apartment about two hours later and she was vomiting and there was blood in her bathroom, pt complains of severe abd pain and vomiting since coming home from hospital

## 2013-11-27 NOTE — Discharge Summary (Signed)
[facility]  Physician Discharge Summary   Patient ID: Kristen Roberson MRN: [id number] DOB/AGE: May 11, 1976 38 y.o.  Admit date: 11/25/2013 Discharge date: 11/27/2013  PCP: Angelica Chessman, MD  DISCHARGE DIAGNOSES:  Active Problems:   Anxiety   Hematemesis   Abdominal pain   Nausea and vomiting   Mallory-Weiss tear   RECOMMENDATIONS FOR OUTPATIENT FOLLOW UP: 1. Patient to keep follow up appointment with Dr. Olevia Perches.  DISCHARGE CONDITION: fair  Diet recommendation: Criss Rosales Diet  Filed Weights   11/25/13 1822  Weight: 55.929 kg (123 lb 4.8 oz)    INITIAL HISTORY: Kristen Roberson is a 38 y.o. female with PMH of Anxiety/Depression, was just discharged from [facility] 5/8 after admission for hematemesis, EGD 5/7 showed Mallory weiss tear and was sent home on PPI BID. She reported going home and having 3 episodes of vomiting after eating and noted small amount of hematemesis. She also reported an episode of large volume hematemesis after attempted breakfast on morning of admission which prompted her to come to the ER.   Consultations:  Lb GI (Dr. Hilarie Fredrickson)  Procedures:  None  HOSPITAL COURSE:   Nausea/Vomiting/hematemesis/Recent Tillman Abide tear  Patient did not have further episodes of vomiting. She did have some nausea but has been tolerating her diet well. Hgb dropped some but remained stable. She did not require blood transfusions. Her diet was advanced. She has had 12 ER visits in 1months due to abd pain. She has had numerous normal CTs. EGD 5/7 showed Mallory Weiss tear. Colonoscopy in March was normal. She has hemorrhoids which bleed intermittently. No indication for repeat EGD at this time. She was changed to oral PPI and will continue same at home.  Anemia  She has history of Iron deficiency anemia. She had acute blood loss anemia. Hgb dropped compared to 5/9 but same as 5/8 and stable today. 5/9 level was likely heme concentrated.   Anxiety/Depression  Suspect  contributing to some of her symptoms. She reports multiple stressors in her life including recent divorce and child custody issues. Not on psyche meds. Will follow up with MH as OP.   She is better. Tolerating her diet. Seen by GI and will follow with them as scheduled. [place] for discharge.   PERTINENT LABS:  The results of significant diagnostics from this hospitalization (including imaging, microbiology, ancillary and laboratory) are listed below for reference.     Labs: Basic Metabolic Panel:  Recent Labs Lab 11/22/13 2300 11/23/13 0445 11/25/13 1425 11/26/13 0528 11/27/13 0454  NA 142 144 136* 138 138  K 4.3 3.9 3.9 4.1 3.8  CL 104 107 99 106 105  CO2 25 26 20 24 23   GLUCOSE 102* 82 109* 118* 98  BUN 10 10 <3* 3* <3*  CREATININE 0.52 0.46* 0.51 0.47* 0.41*  CALCIUM 8.3* 7.7* 8.8 7.8* 8.0*   Liver Function Tests:  Recent Labs Lab 11/22/13 2300 11/23/13 0445 11/25/13 1425 11/26/13 0528  AST 108* 92* 73* 55*  ALT 34 31 29 22   ALKPHOS 110 102 120* 89  BILITOT 0.5 0.5 0.9 0.9  PROT 5.8* 5.6* 6.8 5.2*  ALBUMIN 3.2* 3.2* 3.7 2.8*    Recent Labs Lab 11/22/13 2300  LIPASE 32   CBC:  Recent Labs Lab 11/22/13 2300 11/23/13 0445 11/24/13 0940 11/25/13 1425 11/26/13 0528 11/27/13 0454  WBC 2.2* 2.1* 2.6* 3.0* 3.0* 2.3*  NEUTROABS 1.2*  --   --   --   --   --   HGB 7.8* 7.6* 7.5*  9.3* 7.7* 7.8*  HCT 23.7* 23.2* 23.5* 29.8* 24.2* 24.4*  MCV 96.7 97.5 99.6 98.7 99.6 98.4  PLT 149* 138* 184 312 275 293    IMAGING STUDIES  Dg Abd Acute W/chest  11/25/2013   CLINICAL DATA:  Abdominal pain.  Nausea and vomiting.  EXAM: ACUTE ABDOMEN SERIES (ABDOMEN 2 VIEW & CHEST 1 VIEW)  COMPARISON:  DG ABD PORTABLE 2V dated 11/23/2013; CT ABD/PELVIS W CM dated 10/28/2013; CT ABD/PELVIS W CM dated 10/05/2013  FINDINGS: Bowel gas pattern unremarkable without evidence of obstruction or significant ileus. Expected stool burden in the colon. No evidence of free air on the erect image.  Air-fluid levels in the ascending colon consistent liquid stool. No abnormal calcifications. Apparent scoliosis is felt to be positional.  Cardiomediastinal silhouette unremarkable. Lungs clear. Bronchovascular markings normal. Pulmonary vascularity normal. No visible pleural effusions. No pneumothorax.  IMPRESSION: No acute abdominal or pulmonary abnormality.   Electronically Signed   By: Evangeline Dakin M.D.   On: 11/25/2013 15:59   DISCHARGE EXAMINATION: Filed Vitals:   11/26/13 0600 11/26/13 1459 11/26/13 2106 11/27/13 0520  BP: 120/81 111/72 124/81 131/85  Pulse: 67 84 68 69  Temp: 97.6 F (36.4 C) 98 F (36.7 C) 98.7 F (37.1 C) 97.7 F (36.5 C)  TempSrc: Oral Oral Oral Oral  Resp: 20 20 19 18   Height:      Weight:      SpO2: 100% 98% 98% 100%   General appearance: alert, cooperative, appears stated age and no distress Resp: clear to auscultation bilaterally Cardio: regular rate and rhythm, S1, S2 normal, no murmur, click, rub or gallop GI: soft, minimally tender without rebound rigidity or guarding; bowel sounds normal; no masses,  no organomegaly Neurologic: No focal deficits.  DISPOSITION: Home  Discharge Orders   Future Appointments Provider Department Dept Phone   12/07/2013 2:00 PM Angelica Chessman, MD [place] [phone]   Future Orders Complete By Expires   Discharge diet:  As directed    Discharge instructions  As directed    Increase activity slowly  As directed       ALLERGIES:  Allergies  Allergen Reactions  . Morphine And Related Anaphylaxis     Tolerated hydromorphone on 11/25/13.   . Tramadol Other (See Comments)    Seizures   . Penicillins Other (See Comments)    Unknown childhood reaction.    Current Discharge Medication List    START taking these medications   Details  ondansetron (ZOFRAN-ODT) 4 MG disintegrating tablet Take 1 tablet (4 mg total) by mouth every 8 (eight) hours. Qty: 20 tablet, Refills: 0      sucralfate (CARAFATE) 1 GM/10ML suspension Take 10 mLs (1 g total) by mouth 4 (four) times daily -  with meals and at bedtime. Till you run out of the medication Qty: 420 mL, Refills: 0      CONTINUE these medications which have NOT CHANGED   Details  oxyCODONE (OXY IR/ROXICODONE) 5 MG immediate release tablet Take 1 tablet (5 mg total) by mouth every 4 (four) hours as needed for severe pain. Qty: 30 tablet, Refills: 0    pantoprazole (PROTONIX) 40 MG tablet Take 1 tablet (40 mg total) by mouth daily. Qty: 30 tablet, Refills: 2       Follow-up Information   Follow up with Angelica Chessman, MD. (see as scheduled on 12/07/13 at 2pm)    Specialty:  Internal Medicine   Contact information:   [place]  [place] [address] [phone]       Follow up with [name], MD. (see as scheduled)    Specialty:  Gastroenterology   Contact information:   [address] [place] [address] [phone]       TOTAL DISCHARGE TIME: 35 mins  [facility] Pager [phone]  11/27/2013, 8:17 AM  Disclaimer: This note was dictated with voice recognition software. Similar sounding words can inadvertently be transcribed and may not be corrected upon review.

## 2013-11-27 NOTE — Progress Notes (Signed)
DC instructions and new prescriptions reviewed with pt her significant other. All questions and concerns address, follow up appt in place, pt is alert and oriented times four, pain is controlled at this time, no c/o nausea or vomiting, VSS, skin intact. Pt waiting her ride home. Will continue to monitor until the pt has left the floor.

## 2013-11-27 NOTE — Discharge Instructions (Signed)
Mallory-Weiss Syndrome Mallory-Weiss syndrome refers to bleeding from tears in the lining of the esophagus near where it meets the stomach. This is often caused by forceful vomiting, retching or coughing. This condition is often associated with alcoholism. Usually the bleeding stops by itself after 24 to 48 hours. Sometimes endoscopic or surgical treatment is needed. This condition is not usually fatal. SYMPTOMS  Vomiting of bright red or black coffee ground like material.  Black, tarry stools.  Low blood pressure causing you to feel faint or experience loss of consciousness. DIAGNOSIS  Definitive diagnosis is by endoscopy. Treatment is usually supportive. Persistent bleeding is uncommon. Sometimes cauterization or injection of epinephrine to stop the bleeding is used during the diagnostic endoscopy. Embolization (obstruction) of the arteries supplying the area of bleeding is sometimes used to stop the bleeding.  An NG tube (naso-gastric tube) may be inserted to determine where the bleeding is coming from.  Often an EGD (esophagogastroduodenoscopy) is done. In this procedure there is a small flexible tube-like telescope (endoscope) put into your mouth, through your esophagus (the food tube leading from your mouth to your stomach), down into your stomach and into the small bowel. Through this your caregiver can see what and where the problem is. TREATMENT  It is necessary to stop the bleeding as soon as possible. During the EGD, your caregiver may inject medication into bleeding vessels to clot them.  SEEK IMMEDIATE MEDICAL CARE IF:  You have persistent dizziness, lightheadedness, or fainting.  Your vomiting returns and you have blood in your stools.  You have vomit that is bright red blood or black coffee ground-like blood, bright red blood in the stool or black tarry stools.  You have chest pain.  You cannot eat or drink.  You have nausea or vomiting. MAKE SURE YOU:   Understand  these instructions.  Will watch your condition.  Will get help right away if you are not doing well or get worse. Document Released: 11/23/2005 Document Revised: 09/28/2011 Document Reviewed: 12/28/2005 [facility] Patient Information 2014 [place], [place].

## 2013-11-28 ENCOUNTER — Encounter (HOSPITAL_COMMUNITY): Payer: Self-pay | Admitting: Emergency Medicine

## 2013-11-28 ENCOUNTER — Emergency Department (HOSPITAL_COMMUNITY): Payer: Self-pay

## 2013-11-28 ENCOUNTER — Emergency Department (HOSPITAL_COMMUNITY)
Admission: EM | Admit: 2013-11-28 | Discharge: 2013-11-29 | Disposition: A | Discharge status: Home / Self Care | Payer: Self-pay | Attending: Emergency Medicine | Admitting: Emergency Medicine

## 2013-11-28 DIAGNOSIS — Z8742 Personal history of other diseases of the female genital tract: Secondary | ICD-10-CM | POA: Insufficient documentation

## 2013-11-28 DIAGNOSIS — Z862 Personal history of diseases of the blood and blood-forming organs and certain disorders involving the immune mechanism: Secondary | ICD-10-CM | POA: Insufficient documentation

## 2013-11-28 DIAGNOSIS — Z9889 Other specified postprocedural states: Secondary | ICD-10-CM | POA: Insufficient documentation

## 2013-11-28 DIAGNOSIS — R143 Flatulence: Secondary | ICD-10-CM

## 2013-11-28 DIAGNOSIS — Z8659 Personal history of other mental and behavioral disorders: Secondary | ICD-10-CM | POA: Insufficient documentation

## 2013-11-28 DIAGNOSIS — Z79899 Other long term (current) drug therapy: Secondary | ICD-10-CM | POA: Insufficient documentation

## 2013-11-28 DIAGNOSIS — K5900 Constipation, unspecified: Secondary | ICD-10-CM | POA: Insufficient documentation

## 2013-11-28 DIAGNOSIS — Z880 Allergy status to penicillin: Secondary | ICD-10-CM | POA: Insufficient documentation

## 2013-11-28 DIAGNOSIS — Z9089 Acquired absence of other organs: Secondary | ICD-10-CM | POA: Insufficient documentation

## 2013-11-28 DIAGNOSIS — R142 Eructation: Secondary | ICD-10-CM

## 2013-11-28 DIAGNOSIS — K920 Hematemesis: Secondary | ICD-10-CM | POA: Insufficient documentation

## 2013-11-28 DIAGNOSIS — R609 Edema, unspecified: Secondary | ICD-10-CM | POA: Insufficient documentation

## 2013-11-28 DIAGNOSIS — R141 Gas pain: Secondary | ICD-10-CM | POA: Insufficient documentation

## 2013-11-28 DIAGNOSIS — F411 Generalized anxiety disorder: Secondary | ICD-10-CM | POA: Insufficient documentation

## 2013-11-28 DIAGNOSIS — R1031 Right lower quadrant pain: Secondary | ICD-10-CM | POA: Insufficient documentation

## 2013-11-28 DIAGNOSIS — G8929 Other chronic pain: Secondary | ICD-10-CM | POA: Insufficient documentation

## 2013-11-28 LAB — COMPREHENSIVE METABOLIC PANEL
ALT: 20 U/L (ref 0–35)
AST: 50 U/L — ABNORMAL HIGH (ref 0–37)
Albumin: 3.4 g/dL — ABNORMAL LOW (ref 3.5–5.2)
Alkaline Phosphatase: 96 U/L (ref 39–117)
BUN: 6 mg/dL (ref 6–23)
CALCIUM: 8.3 mg/dL — AB (ref 8.4–10.5)
CO2: 23 meq/L (ref 19–32)
CREATININE: 0.66 mg/dL (ref 0.50–1.10)
Chloride: 111 mEq/L (ref 96–112)
GFR calc Af Amer: >90 mL/min (ref >90)
Glucose, Bld: 90 mg/dL (ref 70–99)
Potassium: 4.4 mEq/L (ref 3.7–5.3)
Sodium: 148 mEq/L — ABNORMAL HIGH (ref 137–147)
TOTAL PROTEIN: 6.1 g/dL (ref 6.0–8.3)
Total Bilirubin: 0.3 mg/dL (ref 0.3–1.2)

## 2013-11-28 LAB — CBC WITH DIFFERENTIAL/PLATELET
BASOS ABS: 0 10*3/uL (ref 0.0–0.1)
Basophils Absolute: 0.1 K/uL (ref 0.0–0.1)
Basophils Relative: 0 % (ref 0–1)
Basophils Relative: 2 % — ABNORMAL HIGH (ref 0–1)
EOS ABS: 0 10*3/uL (ref 0.0–0.7)
EOS PCT: 0 % (ref 0–5)
Eosinophils Absolute: 0 K/uL (ref 0.0–0.7)
Eosinophils Relative: 1 % (ref 0–5)
HCT: 27.9 % — ABNORMAL LOW (ref 36.0–46.0)
HCT: 26.9 % — ABNORMAL LOW (ref 36.0–46.0)
Hemoglobin: 9 g/dL — ABNORMAL LOW (ref 12.0–15.0)
Hemoglobin: 8.5 g/dL — ABNORMAL LOW (ref 12.0–15.0)
LYMPHS ABS: 1 10*3/uL (ref 0.7–4.0)
Lymphocytes Relative: 35 % (ref 12–46)
Lymphocytes Relative: 42 % (ref 12–46)
Lymphs Abs: 1 K/uL (ref 0.7–4.0)
MCH: 32 pg (ref 26.0–34.0)
MCH: 30.8 pg (ref 26.0–34.0)
MCHC: 32.3 g/dL (ref 30.0–36.0)
MCHC: 31.6 g/dL (ref 30.0–36.0)
MCV: 99.3 fL (ref 78.0–100.0)
MCV: 97.5 fL (ref 78.0–100.0)
MONO ABS: 0.3 10*3/uL (ref 0.1–1.0)
Monocytes Absolute: 0.3 K/uL (ref 0.1–1.0)
Monocytes Relative: 11 % (ref 3–12)
Monocytes Relative: 13 % — ABNORMAL HIGH (ref 3–12)
NEUTROS PCT: 54 % (ref 43–77)
Neutro Abs: 1.5 10*3/uL — ABNORMAL LOW (ref 1.7–7.7)
Neutro Abs: 1 K/uL — ABNORMAL LOW (ref 1.7–7.7)
Neutrophils Relative %: 42 % — ABNORMAL LOW (ref 43–77)
PLATELETS: 387 10*3/uL (ref 150–400)
Platelets: 385 K/uL (ref 150–400)
RBC: 2.81 MIL/uL — AB (ref 3.87–5.11)
RBC: 2.76 MIL/uL — ABNORMAL LOW (ref 3.87–5.11)
RDW: 16.7 % — AB (ref 11.5–15.5)
RDW: 16.8 % — ABNORMAL HIGH (ref 11.5–15.5)
WBC: 2.8 10*3/uL — ABNORMAL LOW (ref 4.0–10.5)
WBC: 2.5 K/uL — ABNORMAL LOW (ref 4.0–10.5)

## 2013-11-28 LAB — POC OCCULT BLOOD, ED: Fecal Occult Bld: NEGATIVE

## 2013-11-28 LAB — URINALYSIS, ROUTINE W REFLEX MICROSCOPIC
Bilirubin Urine: NEGATIVE
Glucose, UA: NEGATIVE mg/dL
Hgb urine dipstick: NEGATIVE
Ketones, ur: NEGATIVE mg/dL
Leukocytes, UA: NEGATIVE
Nitrite: NEGATIVE
Protein, ur: NEGATIVE mg/dL
Specific Gravity, Urine: 1.016 (ref 1.005–1.030)
Urobilinogen, UA: 1 mg/dL (ref 0.0–1.0)
pH: 7.5 (ref 5.0–8.0)

## 2013-11-28 LAB — RAPID URINE DRUG SCREEN, HOSP PERFORMED
Amphetamines: NOT DETECTED
Barbiturates: NOT DETECTED
Benzodiazepines: POSITIVE — AB
Cocaine: NOT DETECTED
Opiates: NOT DETECTED
Tetrahydrocannabinol: NOT DETECTED

## 2013-11-28 LAB — PRO B NATRIURETIC PEPTIDE: PRO B NATRI PEPTIDE: 39.4 pg/mL (ref 0–125)

## 2013-11-28 MED ORDER — ONDANSETRON HCL 4 MG/2ML IJ SOLN: 4.0000 mg | Freq: Once | INTRAMUSCULAR | Status: DC

## 2013-11-28 MED ORDER — SODIUM CHLORIDE 0.9 % IV BOLUS (SEPSIS)
1000.0000 mL | Freq: Once | INTRAVENOUS | Status: AC
  Administered 2013-11-28: 1000 mL via INTRAVENOUS

## 2013-11-28 MED ORDER — LORAZEPAM 0.5 MG PO TABS: 0.5000 mg | ORAL_TABLET | Freq: Three times a day (TID) | ORAL | Status: DC | PRN

## 2013-11-28 MED ORDER — HYDROMORPHONE HCL PF 1 MG/ML IJ SOLN
0.5000 mg | Freq: Once | INTRAMUSCULAR | Status: AC
  Administered 2013-11-28: 0.5 mg via INTRAVENOUS
  Filled 2013-11-28: qty 1

## 2013-11-28 MED ORDER — ONDANSETRON HCL 4 MG/2ML IJ SOLN
4.0000 mg | Freq: Once | INTRAMUSCULAR | Status: AC
  Administered 2013-11-28: 4 mg via INTRAVENOUS
  Filled 2013-11-28: qty 2

## 2013-11-28 MED ORDER — LORAZEPAM 2 MG/ML IJ SOLN
1.0000 mg | Freq: Once | INTRAMUSCULAR | Status: AC
  Administered 2013-11-28: 1 mg via INTRAVENOUS
  Filled 2013-11-28: qty 1

## 2013-11-28 MED ORDER — HYDROMORPHONE HCL PF 1 MG/ML IJ SOLN
0.5000 mg | Freq: Once | INTRAMUSCULAR | Status: AC
Start: 2013-11-28 — End: 2013-11-28
  Administered 2013-11-28: 0.5 mg via INTRAVENOUS
  Filled 2013-11-28: qty 1

## 2013-11-28 MED ORDER — HYDROMORPHONE HCL PF 1 MG/ML IJ SOLN: 1.0000 mg | Freq: Once | INTRAMUSCULAR | Status: DC

## 2013-11-28 NOTE — ED Notes (Signed)
Lab called about BNP- reports they will run test now.

## 2013-11-28 NOTE — ED Notes (Signed)
Pt c/o RLQ pain for several hours.  Also c/o bil lower leg and feet swelling onset today.  Pt st's has not had a BM in over a week.  Also c/o nausea and vomiting.

## 2013-11-28 NOTE — ED Notes (Signed)
Patient unable to urinate at this time. 

## 2013-11-28 NOTE — ED Provider Notes (Signed)
CSN: [id number]     Arrival date & time 11/27/13  2241 History   First MD Initiated Contact with Patient 11/28/13 0107     Chief Complaint  Patient presents with  . Vomiting     (Consider location/radiation/quality/duration/timing/severity/associated sxs/prior Treatment) The history is provided by the patient. No language interpreter was used.  Kristen Roberson is a 38 year old female with past medical history of proctitis, ovarian cysts, seizures, anemia, blood transfusions, depression, fatty liver presenting to the ED with abdominal pain, nausea, vomiting. Patient was just released from the hospital yesterday morning regarding Mallory-Weiss tear that was diagnosed back in 11/23/2013 regarding EGD that was perform. Patient reports she's been having this discomfort localized to the Center for chest and radiates towards her back described as a sharp pain. Stated that she has 2 tears as diagnosed on her EGD. Patient reports she's been having emesis-mainly blood consistency-reports that she had at least 3-4 episodes today. Reported that the emesis is bright red blood. Reported that she continues to have retching. Stated that she is unable to keep any food or fluids down. Stated that she has an appointment with GI on Dec 07, 2013. Denied fever, chills, chest pain, shortness of breath, difficulty breathing, melena, hematochezia, hematuria, fainting. PCP Dr. Doreene Burke  Past Medical History  Diagnosis Date  . Proctitis   . Cysts of both ovaries   . Seizures   . Anemia   . Anxiety   . Blood transfusion without reported diagnosis   . Depression   . Fatty liver 10/05/13   Past Surgical History  Procedure Laterality Date  . Ovarian cyst removal    . Laparoscopy N/A 09/28/2013    Procedure: LAPAROSCOPY OPERATIVE;  Surgeon: Terrance Mass, MD;  Location: [facility];  Service: Gynecology;  Laterality: N/A;  . Laparoscopic appendectomy Right 09/28/2013    Procedure: APPENDECTOMY LAPAROSCOPIC;  Surgeon: Terrance Mass, MD;  Location: [facility];  Service: Gynecology;  Laterality: Right;  . Salpingoophorectomy Right 09/28/2013    Procedure: SALPINGO OOPHORECTOMY;  Surgeon: Terrance Mass, MD;  Location: [facility];  Service: Gynecology;  Laterality: Right;  . Colonoscopy N/A 09/30/2013    Procedure: COLONOSCOPY;  Surgeon: Lafayette Dragon, MD;  Location: [facility];  Service: Endoscopy;  Laterality: N/A;  . Esophagogastroduodenoscopy N/A 11/23/2013    Procedure: ESOPHAGOGASTRODUODENOSCOPY (EGD);  Surgeon: Jerene Bears, MD;  Location: [facility];  Service: Endoscopy;  Laterality: N/A;   Family History  Problem Relation Age of Onset  . Diabetes Mother   . Hyperlipidemia Mother   . Stroke Mother   . Diabetes Father    History  Substance Use Topics  . Smoking status: Never Smoker   . Smokeless tobacco: Not on file  . Alcohol Use: 2.4 oz/week    4 Glasses of wine per week     Comment: last had 2 days   OB History   Grav Para Term Preterm Abortions TAB SAB Ect Mult Living   7 3   4  4   3      Review of Systems  Constitutional: Negative for fever and chills.  Respiratory: Negative for chest tightness and shortness of breath.   Cardiovascular: Positive for chest pain.  Gastrointestinal: Positive for nausea, vomiting and abdominal pain. Negative for diarrhea, constipation, blood in stool and anal bleeding.  Genitourinary: Negative for decreased urine volume.  Musculoskeletal: Negative for back pain and neck pain.  Neurological: Positive for dizziness. Negative for weakness.  All other systems reviewed and  are negative.     Allergies  Morphine and related; Tramadol; and Penicillins  Home Medications   Prior to Admission medications   Medication Sig Start Date End Date Taking? Authorizing Provider  oxyCODONE (OXY IR/ROXICODONE) 5 MG immediate release tablet Take 1 tablet (5 mg total) by mouth every 4 (four) hours as needed for severe pain. 11/24/13  Yes Estela Leonie Green, MD    ondansetron (ZOFRAN-ODT) 4 MG disintegrating tablet Take 1 tablet (4 mg total) by mouth every 8 (eight) hours. 11/27/13   Bonnielee Haff, MD  pantoprazole (PROTONIX) 40 MG tablet Take 1 tablet (40 mg total) by mouth daily. 11/24/13   Erline Hau, MD  sucralfate (CARAFATE) 1 GM/10ML suspension Take 10 mLs (1 g total) by mouth 4 (four) times daily -  with meals and at bedtime. Till you run out of the medication 11/27/13   Bonnielee Haff, MD   BP 126/79  Pulse 75  Temp(Src) 97.4 F (36.3 C) (Oral)  Resp 16  SpO2 94%  LMP 09/25/2013 Physical Exam  Nursing note and vitals reviewed. Constitutional: She is oriented to person, place, and time. She appears well-developed and well-nourished.  Patient found to be sitting up and walking around the room - patient unable to sit down secondary to the abdominal pain. Patient reported that she wants the pain to be stopped and that she can only take dilaudid because anything else makes her pass out.   HENT:  Head: Normocephalic and atraumatic.  Eyes: Conjunctivae and EOM are normal. Pupils are equal, round, and reactive to light. Right eye exhibits no discharge. Left eye exhibits no discharge.  Neck: Normal range of motion. Neck supple. No tracheal deviation present.  Cardiovascular: Normal rate, regular rhythm and normal heart sounds.  Exam reveals no friction rub.   No murmur heard. Pulmonary/Chest: Effort normal and breath sounds normal. No respiratory distress. She has no wheezes. She has no rales.  Abdominal: Soft. Normal appearance and bowel sounds are normal. She exhibits no distension. There is tenderness in the right upper quadrant, epigastric area and left upper quadrant. There is no rebound and no guarding.    Negative abdominal distension  Soft abdomen  Negative peritoneal signs Negative guarding Discomfort upon palpation to the upper abdominal quadrants bilaterally   Musculoskeletal: Normal range of motion.  Full ROM to upper  and lower extremities without difficulty noted, negative ataxia noted.  Lymphadenopathy:    She has no cervical adenopathy.  Neurological: She is alert and oriented to person, place, and time. No cranial nerve deficit. She exhibits normal muscle tone. Coordination normal.  Skin: Skin is warm and dry. No rash noted. No erythema.  Psychiatric: She has a normal mood and affect. Her behavior is normal. Thought content normal.    ED Course  Procedures (including critical care time)  6:17 AM This provider spoke with Dr. Hal Hope - discussed case, history, labs. Dr. Hal Hope reported that patient needs a chest xray to admit. This provider spoke with patient again regarding chest xray - patient refusing chest xray and admission. Patient wants to leave.   Results for orders placed during the hospital encounter of 11/27/13  CBC WITH DIFFERENTIAL      Result Value Ref Range   WBC 2.8 (*) 4.0 - 10.5 K/uL   RBC 2.81 (*) 3.87 - 5.11 MIL/uL   Hemoglobin 9.0 (*) 12.0 - 15.0 g/dL   HCT 27.9 (*) 36.0 - 46.0 %   MCV 99.3  78.0 - 100.0 fL  MCH 32.0  26.0 - 34.0 pg   MCHC 32.3  30.0 - 36.0 g/dL   RDW 16.7 (*) 11.5 - 15.5 %   Platelets 387  150 - 400 K/uL   Neutrophils Relative % 54  43 - 77 %   Lymphocytes Relative 35  12 - 46 %   Monocytes Relative 11  3 - 12 %   Eosinophils Relative 0  0 - 5 %   Basophils Relative 0  0 - 1 %   Neutro Abs 1.5 (*) 1.7 - 7.7 K/uL   Lymphs Abs 1.0  0.7 - 4.0 K/uL   Monocytes Absolute 0.3  0.1 - 1.0 K/uL   Eosinophils Absolute 0.0  0.0 - 0.7 K/uL   Basophils Absolute 0.0  0.0 - 0.1 K/uL   RBC Morphology STOMATOCYTES     Smear Review GIANT PLATELETS SEEN    COMPREHENSIVE METABOLIC PANEL      Result Value Ref Range   Sodium 142  137 - 147 mEq/L   Potassium 4.4  3.7 - 5.3 mEq/L   Chloride 106  96 - 112 mEq/L   CO2 24  19 - 32 mEq/L   Glucose, Bld 96  70 - 99 mg/dL   BUN 4 (*) 6 - 23 mg/dL   Creatinine, Ser 0.52  0.50 - 1.10 mg/dL   Calcium 8.6  8.4 - 10.5  mg/dL   Total Protein 6.7  6.0 - 8.3 g/dL   Albumin 3.5  3.5 - 5.2 g/dL   AST 56 (*) 0 - 37 U/L   ALT 24  0 - 35 U/L   Alkaline Phosphatase 108  39 - 117 U/L   Total Bilirubin 0.4  0.3 - 1.2 mg/dL   GFR calc non Af Amer >90  >90 mL/min   GFR calc Af Amer >90  >90 mL/min  LIPASE, BLOOD      Result Value Ref Range   Lipase 22  11 - 59 U/L  URINALYSIS, ROUTINE W REFLEX MICROSCOPIC      Result Value Ref Range   Color, Urine YELLOW  YELLOW   APPearance CLEAR  CLEAR   Specific Gravity, Urine 1.016  1.005 - 1.030   pH 7.5  5.0 - 8.0   Glucose, UA NEGATIVE  NEGATIVE mg/dL   Hgb urine dipstick NEGATIVE  NEGATIVE   Bilirubin Urine NEGATIVE  NEGATIVE   Ketones, ur NEGATIVE  NEGATIVE mg/dL   Protein, ur NEGATIVE  NEGATIVE mg/dL   Urobilinogen, UA 1.0  0.0 - 1.0 mg/dL   Nitrite NEGATIVE  NEGATIVE   Leukocytes, UA NEGATIVE  NEGATIVE  URINE RAPID DRUG SCREEN (HOSP PERFORMED)      Result Value Ref Range   Opiates NONE DETECTED  NONE DETECTED   Cocaine NONE DETECTED  NONE DETECTED   Benzodiazepines POSITIVE (*) NONE DETECTED   Amphetamines NONE DETECTED  NONE DETECTED   Tetrahydrocannabinol NONE DETECTED  NONE DETECTED   Barbiturates NONE DETECTED  NONE DETECTED    Labs Review Labs Reviewed  CBC WITH DIFFERENTIAL - Abnormal; Notable for the following:    WBC 2.8 (*)    RBC 2.81 (*)    Hemoglobin 9.0 (*)    HCT 27.9 (*)    RDW 16.7 (*)    Neutro Abs 1.5 (*)    All other components within normal limits  COMPREHENSIVE METABOLIC PANEL - Abnormal; Notable for the following:    BUN 4 (*)    AST 56 (*)    All other components  within normal limits  URINE RAPID DRUG SCREEN (HOSP PERFORMED) - Abnormal; Notable for the following:    Benzodiazepines POSITIVE (*)    All other components within normal limits  LIPASE, BLOOD  URINALYSIS, ROUTINE W REFLEX MICROSCOPIC  POC URINE PREG, ED    Imaging Review No results found.   EKG Interpretation None      MDM   Final diagnoses:   Mallory-Weiss tear  Vomiting   Medications  ondansetron (ZOFRAN) injection 4 mg (4 mg Intravenous Not Given 11/27/13 2345)  ondansetron (ZOFRAN) injection 4 mg (4 mg Intravenous Not Given 11/28/13 0238)  sodium chloride 0.9 % bolus 1,000 mL (0 mLs Intravenous Stopped 11/28/13 0431)  HYDROmorphone (DILAUDID) injection 0.5 mg (0.5 mg Intravenous Given 11/28/13 0237)  HYDROmorphone (DILAUDID) injection 0.5 mg (0.5 mg Intravenous Given 11/28/13 0432)   Filed Vitals:   11/27/13 2303 11/28/13 0342 11/28/13 0639  BP: 146/95 121/79 126/79  Pulse: 60 79 75  Temp: 98.6 F (37 C)  97.4 F (36.3 C)  TempSrc: Oral  Oral  Resp: 20 16 16   SpO2: 96% 100% 94%    This provider reviewed patient's chart. Patient was seen and assessed in ED setting multiple times regarding ongoing retching and emesis. Patient has CT abdomen and pelvis with contrast performed 10/28/2013 negative acute findings. Patient was seen and assessed on 11/23/2013 were abdominal series was negative for acute abdominal findings. 11/25/2013 patient was admitted to the hospital where upper EGD was performed that identified [employer]. Patient was recently discharged from the hospital yesterday morning. Patient was seen and assessed by GI on 11/26/2013 prior to discharge with recommendation to be started on PPI and protonix - repeat EGD not needed, outpatient follow up already scheduled for 12/07/2013. 09/29/2013 patient exploratory laparotomy to identify ongoing abdominal pain with appendectomy and right slapingoophorectomy performed. 10/01/2013 colonoscopy performed with unremarkable results.  This provider reviewed labs in great detail from previous admission - unremarkable. Negative occult, unremarkable changes in INR/PT.  CBC with differential noted white blood cell count of 2.8-negative left shift or leukocytosis noted. Hgb  9.0 - appears to be dehydrated. CMP noted mildly low BUN of 4. AST mildly elevated at 56. Lipase negative  elevation. Appears to be a chronic issue - patient has had 7 visits regarding ongoing abdominal pain with unremarkable colonoscopy, exploratory laparotomy. EGD noted gastritis and MWT on 11/23/2013 - patient currently being followed by GI. This provider had numerous discussions with the patient regarding chest xray to identify any type loss of free air, as well CT of abdomen. Patient was going back and forth with decision. Patient reported that she did not want to get any imaging performed - reported that she cannot afford it. This provider had a long discussion about concerns, further imaging and testing - had numerous conversations with the patient regarding this. Patient in proper state to make decisions - reported that she did not want to be admitted, nor did she want further work-up to be performed. This provider discussed the dangers and concern regarding MWT with continuous retching and vomiting - patient understood, as well as significant other that she was with. Friend reported that medications she was discharged with have been picked up and that he is going to be staying with her for the next couple of days to monitor and if anything worsens he is to bring her back. Patient signed out AMA. Nurse discussed risks associated with leaving AMA - patient understood and continues to want to leave AMA.  Patient left before papers could be given.   Jamse Mead, PA-C 11/29/13 0800

## 2013-11-28 NOTE — ED Notes (Signed)
Patient arrives with multiple complaints. Patient was in and out of consciousness during triage assessment. Patient repeatedly stating her abdomen hurts. Also states that she has been vomiting blood and her legs are swelling. Legs noted to be swollen on inspection, but no emesis expressed in triage.

## 2013-11-28 NOTE — Discharge Instructions (Signed)
Peripheral Edema You have swelling in your legs (peripheral edema). This swelling is due to excess accumulation of salt and water in your body. Edema may be a sign of heart, kidney or liver disease, or a side effect of a medication. It may also be due to problems in the leg veins. Elevating your legs and using special support stockings may be very helpful, if the cause of the swelling is due to poor venous circulation. Avoid long periods of standing, whatever the cause. Treatment of edema depends on identifying the cause. Chips, pretzels, pickles and other salty foods should be avoided. Restricting salt in your diet is almost always needed. Water pills (diuretics) are often used to remove the excess salt and water from your body via urine. These medicines prevent the kidney from reabsorbing sodium. This increases urine flow. Diuretic treatment may also result in lowering of potassium levels in your body. Potassium supplements may be needed if you have to use diuretics daily. Daily weights can help you keep track of your progress in clearing your edema. You should call your caregiver for follow up care as recommended. SEEK IMMEDIATE MEDICAL CARE IF:   You have increased swelling, pain, redness, or heat in your legs.  You develop shortness of breath, especially when lying down.  You develop chest or abdominal pain, weakness, or fainting.  You have a fever. Document Released: 08/13/2004 Document Revised: 09/28/2011 Document Reviewed: 07/24/2009 [facility] Patient Information 2014 [place]. Abdominal Pain, Adult Many things can cause abdominal pain. Usually, abdominal pain is not caused by a disease and will improve without treatment. It can often be observed and treated at home. Your health care provider will do a physical exam and possibly order blood tests and X-rays to help determine the seriousness of your pain. However, in many cases, more time must pass before a clear cause of the pain can be  found. Before that point, your health care provider may not know if you need more testing or further treatment. HOME CARE INSTRUCTIONS  Monitor your abdominal pain for any changes. The following actions may help to alleviate any discomfort you are experiencing:  Only take over-the-counter or prescription medicines as directed by your health care provider.  Do not take laxatives unless directed to do so by your health care provider.  Try a clear liquid diet (broth, tea, or water) as directed by your health care provider. Slowly move to a bland diet as tolerated. SEEK MEDICAL CARE IF:  You have unexplained abdominal pain.  You have abdominal pain associated with nausea or diarrhea.  You have pain when you urinate or have a bowel movement.  You experience abdominal pain that wakes you in the night.  You have abdominal pain that is worsened or improved by eating food.  You have abdominal pain that is worsened with eating fatty foods. SEEK IMMEDIATE MEDICAL CARE IF:   Your pain does not go away within 2 hours.  You have a fever.  You keep throwing up (vomiting).  Your pain is felt only in portions of the abdomen, such as the right side or the left lower portion of the abdomen.  You pass bloody or black tarry stools. MAKE SURE YOU:  Understand these instructions.   Will watch your condition.   Will get help right away if you are not doing well or get worse.  Document Released: 04/15/2005 Document Revised: 04/26/2013 Document Reviewed: 03/15/2013 [facility] Patient Information 2014 [place].

## 2013-11-28 NOTE — ED Notes (Signed)
Pt aware of risks associated with leaving AMA. Pt understands the risks and still wants to leave AMA.

## 2013-11-28 NOTE — ED Provider Notes (Signed)
Medical screening examination/treatment/procedure(s) were performed by non-physician practitioner and as supervising physician I was immediately available for consultation/collaboration.   EKG Interpretation None       Jasper Riling. Alvino Chapel, MD 11/28/13 2332

## 2013-11-28 NOTE — ED Notes (Signed)
Patient transported to X-ray 

## 2013-11-28 NOTE — ED Provider Notes (Signed)
CSN: [id number]     Arrival date & time 11/28/13  1930 History   First MD Initiated Contact with Patient 11/28/13 2023     Chief Complaint  Patient presents with  . Hematemesis  . Abdominal Pain  . Leg Swelling     (Consider location/radiation/quality/duration/timing/severity/associated sxs/prior Treatment) HPI   Patient to the ER with complaints of abdominal pain (RLQ), constipation, anxiety, and lower extremity swelling. She was admitted recently after undergoing endoscopy in which they determined she has an upper GI bleed secondary to Mallory-Weiss tear and or gastritis. She was seen in the ED yesterday but stayed down in the ED " for too long" therefore left AMA She admits that she no longer has an ovary and has had an appendectomy. She reports hurting this severely in her RLQ for the past year or so and no one can tell her why she is hurting. She has not had any episodes of vomiting or diarrhea since leaving the ED.  Today she as at the park with her friend and noticed that her lower extremities were starting to swell. She says this has never happened before and is very anxious about why this is happening.     Past Medical History  Diagnosis Date  . Proctitis   . Cysts of both ovaries   . Seizures   . Anemia   . Anxiety   . Blood transfusion without reported diagnosis   . Depression   . Fatty liver 10/05/13   Past Surgical History  Procedure Laterality Date  . Ovarian cyst removal    . Laparoscopy N/A 09/28/2013    Procedure: LAPAROSCOPY OPERATIVE;  Surgeon: Terrance Mass, MD;  Location: [facility];  Service: Gynecology;  Laterality: N/A;  . Laparoscopic appendectomy Right 09/28/2013    Procedure: APPENDECTOMY LAPAROSCOPIC;  Surgeon: Terrance Mass, MD;  Location: [facility];  Service: Gynecology;  Laterality: Right;  . Salpingoophorectomy Right 09/28/2013    Procedure: SALPINGO OOPHORECTOMY;  Surgeon: Terrance Mass, MD;  Location: [facility];  Service: Gynecology;  Laterality:  Right;  . Colonoscopy N/A 09/30/2013    Procedure: COLONOSCOPY;  Surgeon: Lafayette Dragon, MD;  Location: [facility];  Service: Endoscopy;  Laterality: N/A;  . Esophagogastroduodenoscopy N/A 11/23/2013    Procedure: ESOPHAGOGASTRODUODENOSCOPY (EGD);  Surgeon: Jerene Bears, MD;  Location: [facility];  Service: Endoscopy;  Laterality: N/A;   Family History  Problem Relation Age of Onset  . Diabetes Mother   . Hyperlipidemia Mother   . Stroke Mother   . Diabetes Father    History  Substance Use Topics  . Smoking status: Never Smoker   . Smokeless tobacco: Not on file  . Alcohol Use: 2.4 oz/week    4 Glasses of wine per week     Comment: last had 2 days   OB History   Grav Para Term Preterm Abortions TAB SAB Ect Mult Living   7 3   4  4   3      Review of Systems Gastrointestinal: Positive for  abdominal pain and abdominal distention.   Genitourinary:  Last menstrual period 3 months ago  MSK: Lower extremity swelling All other systems reviewed and are negative.    Allergies  Morphine and related; Tramadol; and Penicillins  Home Medications   Prior to Admission medications   Medication Sig Start Date End Date Taking? Authorizing Provider  ondansetron (ZOFRAN-ODT) 4 MG disintegrating tablet Take 1 tablet (4 mg total) by mouth every 8 (eight) hours. 11/27/13  Yes  Bonnielee Haff, MD  oxyCODONE (OXY IR/ROXICODONE) 5 MG immediate release tablet Take 1 tablet (5 mg total) by mouth every 4 (four) hours as needed for severe pain. 11/24/13  Yes Estela Leonie Green, MD  pantoprazole (PROTONIX) 40 MG tablet Take 1 tablet (40 mg total) by mouth daily. 11/24/13  Yes Estela Leonie Green, MD  sucralfate (CARAFATE) 1 GM/10ML suspension Take 10 mLs (1 g total) by mouth 4 (four) times daily -  with meals and at bedtime. Till you run out of the medication 11/27/13  Yes Bonnielee Haff, MD   BP 105/56  Pulse 101  Temp(Src) 98.1 F (36.7 C) (Oral)  Resp 20  Ht 5\' 6"  (1.676 m)  Wt 120 lb  (54.432 kg)  BMI 19.38 kg/m2  SpO2 98%  LMP 09/25/2013 Physical Exam  Nursing note and vitals reviewed. Constitutional: She appears well-developed and well-nourished. No distress.  HENT:  Head: Normocephalic and atraumatic.  Eyes: Pupils are equal, round, and reactive to light.  Neck: Normal range of motion. Neck supple.  Cardiovascular: Normal rate and regular rhythm.   Pulmonary/Chest: Effort normal.  Abdominal: Soft. Bowel sounds are normal. She exhibits no distension and no fluid wave. There is tenderness (RLQ that is mild). There is no rigidity, no rebound, no guarding and no CVA tenderness.  abd is soft  Musculoskeletal:  Lower extremity swelling that is symmetrical and without pitting. No redness, induration or pain associated.  Neurological: She is alert.  Skin: Skin is warm and dry.    ED Course  Procedures (including critical care time) Labs Review Labs Reviewed  CBC WITH DIFFERENTIAL - Abnormal; Notable for the following:    WBC 2.5 (*)    RBC 2.76 (*)    Hemoglobin 8.5 (*)    HCT 26.9 (*)    RDW 16.8 (*)    Neutrophils Relative % 42 (*)    Neutro Abs 1.0 (*)    Monocytes Relative 13 (*)    Basophils Relative 2 (*)    All other components within normal limits  COMPREHENSIVE METABOLIC PANEL - Abnormal; Notable for the following:    Sodium 148 (*)    Calcium 8.3 (*)    Albumin 3.4 (*)    AST 50 (*)    All other components within normal limits  PRO B NATRIURETIC PEPTIDE  OCCULT BLOOD X 1 CARD TO LAB, STOOL  POC OCCULT BLOOD, ED    Imaging Review Dg Chest 2 View  11/28/2013   CLINICAL DATA:  Bilateral lower leg swelling  EXAM: CHEST  2 VIEW  COMPARISON:  11/25/2013  FINDINGS: The heart size and mediastinal contours are within normal limits. Both lungs are clear. The visualized skeletal structures are unremarkable.  IMPRESSION: No active cardiopulmonary disease.   Electronically Signed   By: Inez Catalina M.D.   On: 11/28/2013 21:51     EKG  Interpretation None      MDM   Final diagnoses:  Dependent edema    The patient no longer has appendix or ovary in the RLQ, she has chronic abdominal pain and this is what she has been experiencing for the past year. She has an appointment coming up with the GI doctor for her gastritis where her chronic pain can be addressed. She was given 1 mg IV Ativan in the ER and she has been resting quality since and is easily arousible to verbal stimuli.   Her chest xray and hemoccult are normal. She had a normal BNP and her  cbc, BMP are reassuring and baseline for her.  38 y.o.Kristen Roberson's evaluation in the Emergency Department is complete. It has been determined that no acute conditions requiring further emergency intervention are present at this time. The patient/guardian have been advised of the diagnosis and plan. We have discussed signs and symptoms that warrant return to the ED, such as changes or worsening in symptoms.  Vital signs are stable at discharge. Filed Vitals:   11/28/13 2145  BP: 105/56  Pulse: 101  Temp:   Resp: 20    Patient/guardian has voiced understanding and agreed to follow-up with the PCP or specialist.     Linus Mako, PA-C 11/28/13 2328

## 2013-11-30 ENCOUNTER — Encounter (HOSPITAL_COMMUNITY): Payer: Self-pay | Admitting: Emergency Medicine

## 2013-11-30 ENCOUNTER — Emergency Department (HOSPITAL_COMMUNITY)
Admission: EM | Admit: 2013-11-30 | Discharge: 2013-11-30 | Disposition: A | Discharge status: Home / Self Care | Payer: MEDICAID | Attending: Emergency Medicine | Admitting: Emergency Medicine

## 2013-11-30 ENCOUNTER — Emergency Department (HOSPITAL_COMMUNITY)
Admission: EM | Admit: 2013-11-30 | Discharge: 2013-12-01 | Disposition: A | Discharge status: Home / Self Care | Payer: Self-pay | Attending: Emergency Medicine | Admitting: Emergency Medicine

## 2013-11-30 ENCOUNTER — Emergency Department (HOSPITAL_COMMUNITY): Payer: Self-pay

## 2013-11-30 DIAGNOSIS — Z8742 Personal history of other diseases of the female genital tract: Secondary | ICD-10-CM | POA: Insufficient documentation

## 2013-11-30 DIAGNOSIS — T40605A Adverse effect of unspecified narcotics, initial encounter: Secondary | ICD-10-CM | POA: Insufficient documentation

## 2013-11-30 DIAGNOSIS — Z8669 Personal history of other diseases of the nervous system and sense organs: Secondary | ICD-10-CM | POA: Insufficient documentation

## 2013-11-30 DIAGNOSIS — Z8659 Personal history of other mental and behavioral disorders: Secondary | ICD-10-CM | POA: Insufficient documentation

## 2013-11-30 DIAGNOSIS — Z862 Personal history of diseases of the blood and blood-forming organs and certain disorders involving the immune mechanism: Secondary | ICD-10-CM | POA: Insufficient documentation

## 2013-11-30 DIAGNOSIS — T50905A Adverse effect of unspecified drugs, medicaments and biological substances, initial encounter: Secondary | ICD-10-CM

## 2013-11-30 DIAGNOSIS — F1010 Alcohol abuse, uncomplicated: Secondary | ICD-10-CM | POA: Insufficient documentation

## 2013-11-30 DIAGNOSIS — R1031 Right lower quadrant pain: Secondary | ICD-10-CM | POA: Insufficient documentation

## 2013-11-30 DIAGNOSIS — R609 Edema, unspecified: Secondary | ICD-10-CM | POA: Insufficient documentation

## 2013-11-30 DIAGNOSIS — K2920 Alcoholic gastritis without bleeding: Secondary | ICD-10-CM

## 2013-11-30 DIAGNOSIS — IMO0002 Reserved for concepts with insufficient information to code with codable children: Secondary | ICD-10-CM | POA: Insufficient documentation

## 2013-11-30 DIAGNOSIS — Z880 Allergy status to penicillin: Secondary | ICD-10-CM | POA: Insufficient documentation

## 2013-11-30 DIAGNOSIS — F10929 Alcohol use, unspecified with intoxication, unspecified: Secondary | ICD-10-CM

## 2013-11-30 DIAGNOSIS — M25476 Effusion, unspecified foot: Secondary | ICD-10-CM | POA: Insufficient documentation

## 2013-11-30 DIAGNOSIS — R52 Pain, unspecified: Secondary | ICD-10-CM | POA: Insufficient documentation

## 2013-11-30 DIAGNOSIS — Z8719 Personal history of other diseases of the digestive system: Secondary | ICD-10-CM | POA: Insufficient documentation

## 2013-11-30 DIAGNOSIS — M25473 Effusion, unspecified ankle: Secondary | ICD-10-CM | POA: Insufficient documentation

## 2013-11-30 DIAGNOSIS — R404 Transient alteration of awareness: Secondary | ICD-10-CM | POA: Insufficient documentation

## 2013-11-30 LAB — CBC WITH DIFFERENTIAL/PLATELET
BASOS PCT: 2 % — AB (ref 0–1)
Basophils Absolute: 0.1 10*3/uL (ref 0.0–0.1)
Eosinophils Absolute: 0 10*3/uL (ref 0.0–0.7)
Eosinophils Relative: 0 % (ref 0–5)
HCT: 29.7 % — ABNORMAL LOW (ref 36.0–46.0)
Hemoglobin: 9.6 g/dL — ABNORMAL LOW (ref 12.0–15.0)
LYMPHS PCT: 38 % (ref 12–46)
Lymphs Abs: 1.1 10*3/uL (ref 0.7–4.0)
MCH: 31.7 pg (ref 26.0–34.0)
MCHC: 32.3 g/dL (ref 30.0–36.0)
MCV: 98 fL (ref 78.0–100.0)
MONOS PCT: 14 % — AB (ref 3–12)
Monocytes Absolute: 0.4 10*3/uL (ref 0.1–1.0)
NEUTROS ABS: 1.3 10*3/uL — AB (ref 1.7–7.7)
Neutrophils Relative %: 47 % (ref 43–77)
PLATELETS: 381 10*3/uL (ref 150–400)
RBC: 3.03 MIL/uL — ABNORMAL LOW (ref 3.87–5.11)
RDW: 16.2 % — ABNORMAL HIGH (ref 11.5–15.5)
WBC: 2.9 10*3/uL — AB (ref 4.0–10.5)

## 2013-11-30 LAB — COMPREHENSIVE METABOLIC PANEL
ALT: 19 U/L (ref 0–35)
AST: 69 U/L — ABNORMAL HIGH (ref 0–37)
Albumin: 3.8 g/dL (ref 3.5–5.2)
Alkaline Phosphatase: 106 U/L (ref 39–117)
BILIRUBIN TOTAL: 0.4 mg/dL (ref 0.3–1.2)
BUN: 8 mg/dL (ref 6–23)
CO2: 25 meq/L (ref 19–32)
CREATININE: 0.6 mg/dL (ref 0.50–1.10)
Calcium: 8.8 mg/dL (ref 8.4–10.5)
Chloride: 104 mEq/L (ref 96–112)
Glucose, Bld: 104 mg/dL — ABNORMAL HIGH (ref 70–99)
Potassium: 3.8 mEq/L (ref 3.7–5.3)
Sodium: 143 mEq/L (ref 137–147)
Total Protein: 7 g/dL (ref 6.0–8.3)

## 2013-11-30 LAB — ETHANOL: ALCOHOL ETHYL (B): 400 mg/dL — AB (ref 0–11)

## 2013-11-30 LAB — LIPASE, BLOOD: LIPASE: 30 U/L (ref 11–59)

## 2013-11-30 MED ORDER — SODIUM CHLORIDE 0.9 % IV SOLN
INTRAVENOUS | Status: DC
Start: 2013-11-30 — End: 2013-12-01
  Administered 2013-11-30: via INTRAVENOUS

## 2013-11-30 MED ORDER — METOCLOPRAMIDE HCL 5 MG/ML IJ SOLN
10.0000 mg | Freq: Once | INTRAMUSCULAR | Status: AC
  Administered 2013-11-30: 10 mg via INTRAVENOUS
  Filled 2013-11-30: qty 2

## 2013-11-30 NOTE — ED Provider Notes (Signed)
CSN: [id number]     Arrival date & time 11/30/13  2155 History   First MD Initiated Contact with Patient 11/30/13 2309     Chief Complaint  Patient presents with  . Abdominal Pain  . Foot Swelling     (Consider location/radiation/quality/duration/timing/severity/associated sxs/prior Treatment) HPI Hx per PT - Epigastric pain and N/V with BRB in emesis earlier today. Pain radiates to her back. Has been evaluated multiple times in the ER for this same pain. She had 2 glasses of wine with dinner and pain became more severe tonight, has been ongoing the last 2 days. PT also worried about ongoing ankle swelling. She denies any h/o liver problems, she admits to drinking alcohol daily. No F/C. No new pain or change in symptoms from previous visits.   Past Medical History  Diagnosis Date  . Proctitis   . Cysts of both ovaries   . Seizures   . Anemia   . Anxiety   . Blood transfusion without reported diagnosis   . Depression   . Fatty liver 10/05/13   Past Surgical History  Procedure Laterality Date  . Ovarian cyst removal    . Laparoscopy N/A 09/28/2013    Procedure: LAPAROSCOPY OPERATIVE;  Surgeon: Terrance Mass, MD;  Location: [facility];  Service: Gynecology;  Laterality: N/A;  . Laparoscopic appendectomy Right 09/28/2013    Procedure: APPENDECTOMY LAPAROSCOPIC;  Surgeon: Terrance Mass, MD;  Location: [facility];  Service: Gynecology;  Laterality: Right;  . Salpingoophorectomy Right 09/28/2013    Procedure: SALPINGO OOPHORECTOMY;  Surgeon: Terrance Mass, MD;  Location: [facility];  Service: Gynecology;  Laterality: Right;  . Colonoscopy N/A 09/30/2013    Procedure: COLONOSCOPY;  Surgeon: Lafayette Dragon, MD;  Location: [facility];  Service: Endoscopy;  Laterality: N/A;  . Esophagogastroduodenoscopy N/A 11/23/2013    Procedure: ESOPHAGOGASTRODUODENOSCOPY (EGD);  Surgeon: Jerene Bears, MD;  Location: [facility];  Service: Endoscopy;  Laterality: N/A;   Family History  Problem Relation Age of  Onset  . Diabetes Mother   . Hyperlipidemia Mother   . Stroke Mother   . Diabetes Father    History  Substance Use Topics  . Smoking status: Never Smoker   . Smokeless tobacco: Not on file  . Alcohol Use: 2.4 oz/week    4 Glasses of wine per week     Comment: last had 2 days   OB History   Grav Para Term Preterm Abortions TAB SAB Ect Mult Living   7 3   4  4   3      Review of Systems  Constitutional: Negative for fever and chills.  Respiratory: Negative for shortness of breath.   Cardiovascular: Negative for chest pain.  Gastrointestinal: Positive for vomiting and abdominal pain. Negative for blood in stool.  Genitourinary: Negative for dysuria.  Musculoskeletal: Negative for back pain, neck pain and neck stiffness.  Skin: Negative for rash.  Neurological: Negative for headaches.  All other systems reviewed and are negative.     Allergies  Morphine and related; Tramadol; and Penicillins  Home Medications   Prior to Admission medications   Medication Sig Start Date End Date Taking? Authorizing Provider  oxyCODONE (OXY IR/ROXICODONE) 5 MG immediate release tablet Take 1 tablet (5 mg total) by mouth every 4 (four) hours as needed for severe pain. 11/24/13  Yes Erline Hau, MD   BP 103/68  Pulse 94  Temp(Src) 98.4 F (36.9 C) (Oral)  Resp 12  SpO2 99%  LMP  09/25/2013 Physical Exam  Constitutional: She is oriented to person, place, and time. She appears well-developed and well-nourished.  HENT:  Head: Normocephalic and atraumatic.  Eyes: EOM are normal. Pupils are equal, round, and reactive to light. No scleral icterus.  Neck: Neck supple.  Cardiovascular: Normal rate, regular rhythm and intact distal pulses.   Pulmonary/Chest: Effort normal and breath sounds normal. No respiratory distress.  Abdominal: Soft. Bowel sounds are normal. She exhibits no distension. There is no rebound and no guarding.  TTP epigastric, no RUQ tenderness, neg Murphys sign   Musculoskeletal: Normal range of motion. She exhibits no tenderness.  1 plus symmetric ankle edema  Neurological: She is alert and oriented to person, place, and time. No cranial nerve deficit.  Skin: Skin is warm and dry.    ED Course  Procedures (including critical care time) Labs Review Labs Reviewed  CBC WITH DIFFERENTIAL - Abnormal; Notable for the following:    WBC 2.9 (*)    RBC 3.03 (*)    Hemoglobin 9.6 (*)    HCT 29.7 (*)    RDW 16.2 (*)    Neutro Abs 1.3 (*)    Monocytes Relative 14 (*)    Basophils Relative 2 (*)    All other components within normal limits  COMPREHENSIVE METABOLIC PANEL - Abnormal; Notable for the following:    Glucose, Bld 104 (*)    AST 69 (*)    All other components within normal limits  ETHANOL - Abnormal; Notable for the following:    Alcohol, Ethyl (B) 400 (*)    All other components within normal limits  LIPASE, BLOOD    Imaging Review Dg Chest 2 View  12/01/2013   CLINICAL DATA:  Shortness of breath.  Chest pain.  Foot edema.  EXAM: CHEST  2 VIEW  COMPARISON:  DG CHEST 2 VIEW dated 11/28/2013  FINDINGS: The heart size and mediastinal contours are within normal limits. Both lungs are clear. The visualized skeletal structures are unremarkable.  IMPRESSION: No active cardiopulmonary disease.   Electronically Signed   By: Lucienne Capers M.D.   On: 12/01/2013 00:12    IVFs, IV reglan  PT sobered in ED, has friend with her. She is tolerating PO fluids at 7am and feels comfortable with plan d/c home, outpatient referrals for ETOH abuse and depression. PT denies any SI/ HI. No acute ABd repeat exam.   MDM   Final diagnoses:  Alcoholic gastritis  Alcohol intoxication   H/o same with multiple ER visits for these symptoms, likely M-W tears from ETOH abuse CXR, labs, IVfs and medications Observed and sobered VS and nurses notes reviewed/ considered    Teressa Lower, MD 12/01/13 2329

## 2013-11-30 NOTE — ED Notes (Signed)
Pt reports that she has had intermittent abdominal pain with n/v for the past 4 months, seen multiple times in the ED this week. States that she has swelling to her bilateral feet.  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  Pt is writhing in the stretcher, states that "I am going to pass out." will then flop over in the bed and not respond. Vitals remain normal during these episodes and she will occasionally nod her head.

## 2013-11-30 NOTE — ED Provider Notes (Signed)
CSN: [id number]     Arrival date & time 11/30/13  0018 History   First MD Initiated Contact with Patient 11/30/13 0026     Chief Complaint  Patient presents with  . Abdominal Pain  . Foot Pain     (Consider location/radiation/quality/duration/timing/severity/associated sxs/prior Treatment) HPI 38 year old female presents to the emergency department from home via EMS with reported episode of unresponsiveness.  Per EMS, patient was somnolent upon their arrival, difficult to arouse with sternal rub.  After receiving Narcan, patient was much more alert.  Patient is agitated, complaining of pain all over.  She denies using any narcotic medications tonight.  She reports that she and her friend had dinner, she had a few glasses 01.  She reports that she has not been sleeping well recently in 2 to being separated from her children and in and pending divorce.  She reports that she fell asleep.  Her friend tells a slightly different story.  He reports that he came over to her house and was unable to rouse her.  When he was unable to keep her awake, he called 911.  EMS reports O2 sats dropped into the 50s when she was unresponsive.  Patient then corrects herself, and says that she did take 1 Percocet prescribed to her during a recent hospitalization.  Patient reports that when she comes to the hospital,: That is what take her pain away.  Patient is complaining of pain in her tummy and in her feet bilaterally.  Patient has had multiple recent visits to the emergency department, 18 visits in the last 6 months, and several hospitalizations for  abdominal pain vomiting and Mallard Weiss tear over the last month. Past Medical History  Diagnosis Date  . Proctitis   . Cysts of both ovaries   . Seizures   . Anemia   . Anxiety   . Blood transfusion without reported diagnosis   . Depression   . Fatty liver 10/05/13   Past Surgical History  Procedure Laterality Date  . Ovarian cyst removal    . Laparoscopy N/A  09/28/2013    Procedure: LAPAROSCOPY OPERATIVE;  Surgeon: Terrance Mass, MD;  Location: [facility];  Service: Gynecology;  Laterality: N/A;  . Laparoscopic appendectomy Right 09/28/2013    Procedure: APPENDECTOMY LAPAROSCOPIC;  Surgeon: Terrance Mass, MD;  Location: [facility];  Service: Gynecology;  Laterality: Right;  . Salpingoophorectomy Right 09/28/2013    Procedure: SALPINGO OOPHORECTOMY;  Surgeon: Terrance Mass, MD;  Location: [facility];  Service: Gynecology;  Laterality: Right;  . Colonoscopy N/A 09/30/2013    Procedure: COLONOSCOPY;  Surgeon: Lafayette Dragon, MD;  Location: [facility];  Service: Endoscopy;  Laterality: N/A;  . Esophagogastroduodenoscopy N/A 11/23/2013    Procedure: ESOPHAGOGASTRODUODENOSCOPY (EGD);  Surgeon: Jerene Bears, MD;  Location: [facility];  Service: Endoscopy;  Laterality: N/A;   Family History  Problem Relation Age of Onset  . Diabetes Mother   . Hyperlipidemia Mother   . Stroke Mother   . Diabetes Father    History  Substance Use Topics  . Smoking status: Never Smoker   . Smokeless tobacco: Not on file  . Alcohol Use: 2.4 oz/week    4 Glasses of wine per week     Comment: last had 2 days   OB History   Grav Para Term Preterm Abortions TAB SAB Ect Mult Living   7 3   4  4   3      Review of Systems  See History  of Present Illness; otherwise all other systems are reviewed and negative   Allergies  Morphine and related; Tramadol; and Penicillins  Home Medications   Prior to Admission medications   Medication Sig Start Date End Date Taking? Authorizing Provider  oxyCODONE (OXY IR/ROXICODONE) 5 MG immediate release tablet Take 1 tablet (5 mg total) by mouth every 4 (four) hours as needed for severe pain. 11/24/13  Yes Erline Hau, MD   BP 116/70  Pulse 95  Temp(Src) 97.7 F (36.5 C) (Oral)  Resp 18  Ht 5\' 6"  (1.676 m)  Wt 120 lb (54.432 kg)  BMI 19.38 kg/m2  SpO2 99%  LMP 09/25/2013 Physical Exam  Nursing note and vitals  reviewed. Constitutional: She is oriented to person, place, and time. She appears well-developed and well-nourished.  HENT:  Head: Normocephalic and atraumatic.  Right Ear: External ear normal.  Left Ear: External ear normal.  Nose: Nose normal.  Mouth/Throat: Oropharynx is clear and moist.  Eyes: Conjunctivae and EOM are normal. Pupils are equal, round, and reactive to light.  Neck: Normal range of motion. Neck supple. No JVD present. No tracheal deviation present. No thyromegaly present.  Cardiovascular: Normal rate, regular rhythm, normal heart sounds and intact distal pulses.  Exam reveals no gallop and no friction rub.   No murmur heard. Pulmonary/Chest: Effort normal and breath sounds normal. No stridor. No respiratory distress. She has no wheezes. She has no rales. She exhibits no tenderness.  Abdominal: Soft. Bowel sounds are normal. She exhibits no distension and no mass. There is tenderness (diffuse pain worse in the right lower quadrant). There is no rebound and no guarding.  Musculoskeletal: Normal range of motion. She exhibits edema (1+ edema feet ankles). She exhibits no tenderness.  Lymphadenopathy:    She has no cervical adenopathy.  Neurological: She is alert and oriented to person, place, and time. She has normal reflexes. No cranial nerve deficit. She exhibits normal muscle tone. Coordination normal.  Skin: Skin is dry. No rash noted. No erythema. No pallor.  Psychiatric: She has a normal mood and affect. Her behavior is normal. Judgment and thought content normal.  Patient has poor insight and judgment.  Patient cycles between a branch accepts [place] accent and no accident during her interaction with myself and the nurse.  She is hostile at times, tearful and others, apologetic and then lashes out    ED Course  Procedures (including critical care time) Labs Review Labs Reviewed - No data to display  Imaging Review Dg Chest 2 View  11/28/2013   CLINICAL DATA:   Bilateral lower leg swelling  EXAM: CHEST  2 VIEW  COMPARISON:  11/25/2013  FINDINGS: The heart size and mediastinal contours are within normal limits. Both lungs are clear. The visualized skeletal structures are unremarkable.  IMPRESSION: No active cardiopulmonary disease.   Electronically Signed   By: Inez Catalina M.D.   On: 11/28/2013 21:51     EKG Interpretation None      MDM   Final diagnoses:  Adverse effects of medication   38 year old female has been observed for several hours without signs of altered mental status.  Patient instructed not to drink alcohol while taking oxycodone.  She has been given mental health referrals.    Kalman Drape, MD 11/30/13 [id number]

## 2013-11-30 NOTE — Discharge Instructions (Signed)
Do not mix prescription medications with alcohol.  Please seek help with your ongoing life stressors as they are contributing to your current health issues.  Compression stockings will help with your peripheral edema.

## 2013-11-30 NOTE — ED Notes (Signed)
Pt arrives via EMS. Pt has hx of mixing pills and ETOH. Pts friend arrived to home to find pt passed out. Upon EMS arrival pt refused to come to ED. While EMS completing assessment pt went unresponsive with O2 sats in the 50's. EMS did sternal rub with no response, EMS then administered 1 of Narcan intranasal and pt woke up. Pt now stating that she does not want to be here, has been here everyday for 5 days. States she would rather have her foot pain then be here. BP 102/58 HR 110 pt now 100% on RA.

## 2013-11-30 NOTE — ED Notes (Signed)
Pt educated on not mixing ETOH with home medications. Pt verbalized understanding. This RN encouraged to pt to contact PCP before taking addition narcotic medications. Friend to stay at pts house tonight.

## 2013-11-30 NOTE — ED Notes (Signed)
While asking pt triage assessment questions pt states "I don't know, i don't care"

## 2013-12-01 MED ORDER — METOCLOPRAMIDE HCL 10 MG PO TABS: 10.0000 mg | ORAL_TABLET | Freq: Four times a day (QID) | ORAL | Status: DC

## 2013-12-01 NOTE — Discharge Instructions (Signed)
Alcohol Use Disorder °Alcohol use disorder is a mental disorder. It is not a one-time incident of heavy drinking. Alcohol use disorder is the excessive and uncontrollable use of alcohol over time that leads to problems with functioning in one or more areas of daily living. People with this disorder risk harming themselves and others when they drink to excess. Alcohol use disorder also can cause other mental disorders, such as mood and anxiety disorders, and serious physical problems. People with alcohol use disorder often misuse other drugs.  °Alcohol use disorder is common and widespread. Some people with this disorder drink alcohol to cope with or escape from negative life events. Others drink to relieve chronic pain or symptoms of mental illness. People with a family history of alcohol use disorder are at higher risk of losing control and using alcohol to excess.  °SYMPTOMS  °Signs and symptoms of alcohol use disorder may include the following:  °· Consumption of alcohol in larger amounts or over a longer period of time than intended. °· Multiple unsuccessful attempts to cut down or control alcohol use.   °· A great deal of time spent obtaining alcohol, using alcohol, or recovering from the effects of alcohol (hangover). °· A strong desire or urge to use alcohol (cravings).   °· Continued use of alcohol despite problems at work, school, or home because of alcohol use.   °· Continued use of alcohol despite problems in relationships because of alcohol use. °· Continued use of alcohol in situations when it is physically hazardous, such as driving a car. °· Continued use of alcohol despite awareness of a physical or psychological problem that is likely related to alcohol use. Physical problems related to alcohol use can involve the brain, heart, liver, stomach, and intestines. Psychological problems related to alcohol use include intoxication, depression, anxiety, psychosis, delirium, and dementia.   °· The need for  increased amounts of alcohol to achieve the same desired effect, or a decreased effect from the consumption of the same amount of alcohol (tolerance). °· Withdrawal symptoms upon reducing or stopping alcohol use, or alcohol use to reduce or avoid withdrawal symptoms. Withdrawal symptoms include: °· Racing heart. °· Hand tremor. °· Difficulty sleeping. °· Nausea. °· Vomiting. °· Hallucinations. °· Restlessness. °· Seizures. °DIAGNOSIS °Alcohol use disorder is diagnosed through an assessment by your caregiver. Your caregiver may start by asking three or four questions to screen for excessive or problematic alcohol use. To confirm a diagnosis of alcohol use disorder, at least two symptoms (see SYMPTOMS) must be present within a 12-month period. The severity of alcohol use disorder depends on the number of symptoms: °· Mild two or three. °· Moderate four or five. °· Severe six or more. °Your caregiver may perform a physical exam or use results from lab tests to see if you have physical problems resulting from alcohol use. Your caregiver may refer you to a mental health professional for evaluation. °TREATMENT  °Some people with alcohol use disorder are able to reduce their alcohol use to low-risk levels. Some people with alcohol use disorder need to quit drinking alcohol. When necessary, mental health professionals with specialized training in substance use treatment can help. Your caregiver can help you decide how severe your alcohol use disorder is and what type of treatment you need. The following forms of treatment are available:  °· Detoxification. Detoxification involves the use of prescription medication to prevent alcohol withdrawal symptoms in the first week after quitting. This is important for people with a history of symptoms of withdrawal and for heavy   drinkers who are likely to have withdrawal symptoms. Alcohol withdrawal can be dangerous and, in severe cases, cause death. Detoxification is usually provided  in a hospital or in-patient substance use treatment facility. °· Counseling or talk therapy. Talk therapy is provided by substance use treatment counselors. It addresses the reasons people use alcohol and ways to keep them from drinking again. The goals of talk therapy are to help people with alcohol use disorder find healthy activities and ways to cope with life stress, to identify and avoid triggers for alcohol use, and to handle cravings, which can cause relapse. °· Medication. Different medications can help treat alcohol use disorder through the following actions: °· Decrease alcohol cravings. °· Decrease the positive reward response felt from alcohol use. °· Produce an uncomfortable physical reaction when alcohol is used (aversion therapy). °· Support groups. Support groups are run by people who have quit drinking. They provide emotional support, advice, and guidance. °These forms of treatment are often combined. Some people with alcohol use disorder benefit from intensive combination treatment provided by specialized substance use treatment centers. Both inpatient and outpatient treatment programs are available. °Document Released: 08/13/2004 Document Revised: 03/08/2013 Document Reviewed: 10/13/2012 °ExitCare® Patient Information ©2014 [employer]. ° °Emergency Department Resource Guide °1) Find a Doctor and Pay Out of Pocket °Although you won't have to find out who is covered by your insurance plan, it is a good idea to ask around and get recommendations. You will then need to call the office and see if the doctor you have chosen will accept you as a new patient and what types of options they offer for patients who are self-pay. Some doctors offer discounts or will set up payment plans for their patients who do not have insurance, but you will need to ask so you aren't surprised when you get to your appointment. ° °2) Contact Your Local Health Department °Not all health departments have doctors that can  see patients for sick visits, but many do, so it is worth a call to see if yours does. If you don't know where your local health department is, you can check in your phone book. The CDC also has a tool to help you locate your state's health department, and many state websites also have listings of all of their local health departments. ° °3) Find a Walk-in Clinic °If your illness is not likely to be very severe or complicated, you may want to try a walk in clinic. These are popping up all over the country in pharmacies, drugstores, and shopping centers. They're usually staffed by nurse practitioners or physician assistants that have been trained to treat common illnesses and complaints. They're usually fairly quick and inexpensive. However, if you have serious medical issues or chronic medical problems, these are probably not your best option. ° °No Primary Care Doctor: °- Call [facility] at  832-8000 - they can help you locate a primary care doctor that  accepts your insurance, provides certain services, etc. °- Physician Referral Service- 1-800-533-3463 ° °Chronic Pain Problems: °Organization         Address  Phone   Notes  °[facility]  (336) 297-2271 Patients need to be referred by their primary care doctor.  ° °Medication Assistance: °Organization         Address  Phone   Notes  °[place] [facility] [address] °[place], [place] [address] (336) 641-8030 --Must be a resident of [place] °-- Must have   NO insurance coverage whatsoever (no Medicaid/ Medicare, etc.) °-- The pt. MUST have a primary care doctor that directs their care regularly and follows them in the community °  °[facility]  (866) 331-1348   °[employer]  (888) 892-1162   ° °Agencies that provide inexpensive medical care: °Organization         Address  Phone   Notes  °[facility]  (336) 832-8035   °[facility]    (336) 832-7272   °[facility] [address] °[place], [place] [address] (336) 832-4777   °[facility] [address], °[place] (336) 271-4999   °[facility]    (336) 373-0678   °[facility]    (336) 272-1050   °[facility] ° [address], [place] Phone:  (336) 832-4444, Fax:  (336) 832-4440 Hours of Operation:  9 am - 6 pm, M-F.  Also accepts Medicaid/Medicare and self-pay.  °[facility] ° [address], [place] Phone: (336) 832-3150, Fax: (336) 832-3151. Hours of Operation:  8:30 am - 5:30 pm, M-F.  Also accepts Medicaid and self-pay.  °[facility] [place] [address], [place] Phone: (336) 878-6027   °[facility] [address], [place], [place] (336)723-1848, Ext. 123 Mondays & Thursdays: 7-9 AM.  First 15 patients are seen on a first come, first serve basis. °  ° °Medicaid-accepting [place] Providers: ° °Organization         Address  Phone   Notes  °[facility] [address], [place] (336) 641-2100 Also accepts self-pay patients.  °[facility] [address], [place] ° (336) 856-9996   °[facility] [address], [place] (336) 288-8857   °[facility] [address] [place] [address], [place] (336) 299-7000   °Veita Bland [address], [place]  ° (336) 373-1557 Only accepts [place] Access Medicaid patients after they have their name applied to their card.  ° °Self-Pay (no insurance) in [place]: ° °Organization         Address  Phone   Notes  °Sickle Cell Patients, [facility] [address], [place] (336) 832-1970   °[facility] [address], [place] (336) 832-4400   ° [facility] ° [address], [place] (336) 992-4800   °[facility]/Dr. Osei-Bonsu °  [address] [place] [address], [place] or [address], [place] (336) 841-8500 Phone number for both [place] and [place] locations is the same.  °[facility] [address], [place] (336) 299-0000   °[facility] [address] [place] [address], [place] or [address] (336) 852-7530 °(336) 878-2260   °[facility] [address], [place] (336) 350-1642, phone; (336) 294-5005, fax Sees patients 1st and 3rd Saturday of every month.  Must not qualify for public or private insurance (i.e. Medicaid, Medicare, [place] Health Choice, Veterans' Benefits) • Household income should be no more than 200% of the poverty level •The clinic cannot treat you if you are pregnant or think you are pregnant • Sexually transmitted diseases are not treated at the clinic.  ° ° °Dental Care: °Organization         Address  Phone  Notes  °[place]   [facility] [address], [place] (336) 641-6152 Accepts children up to age 21 who are enrolled in Medicaid or [place] Health Choice; pregnant women with a Medicaid card; and children who have applied for Medicaid or [place] Health Choice, but were declined, whose parents can pay a reduced fee at time of service.  °[facility] [place]  [address], [place] (336) 641-7733 Accepts children up to age 21 who are enrolled in Medicaid or [place] Health Choice; pregnant women with a Medicaid card; and children who have applied for Medicaid or [place] Health Choice, but were declined, whose parents can pay a reduced fee at time of service.  °[facility] ° [address], [place] (336) 641-4533 Patients are seen by appointment only. Walk-ins are not accepted. [facility] will see patients 18 years of age and older. °Monday - Tuesday (8am-5pm) °Most Wednesdays (8:30-5pm) °$30 per visit, cash only  °[facility] ° [address], [place] (336) 641-4533 Patients are seen by appointment only. Walk-ins are not accepted. [facility] will see patients 18 years of age and older. °One Wednesday Evening (Monthly: Volunteer Based).  $30 per visit, cash only  °[facility]  (919) 537-3737 for adults; Children under age 4, call Graduate Pediatric Dentistry at (919) [phone]. Children aged 4-14, please call (919) [phone] to request a pediatric application. ° Dental services are provided in all areas of dental care including fillings, crowns and bridges, complete and partial dentures, implants, gum treatment, root canals, and extractions. Preventive care is also provided. Treatment is provided to both adults and children. °Patients are selected via a lottery and there is often a waiting list. °  °[facility] [address], °[place] ° (336) 763-8833 [email or web address] °  °[facility] [address], [place], [place] (336)723-1848, Ext. 123 Second and Fourth Thursday of each month, opens at 6:30 AM; Clinic ends at 9 AM.  Patients are seen on a first-come first-served basis, and a limited number are seen during each clinic.  ° °[facility] ° [address], [place], [place] (336) 723-7904   Eligibility Requirements °You must have lived in [place], [place], or [place] counties for at least the last three months. °  You cannot be eligible for state or federal sponsored healthcare insurance, including Veterans Administration, Medicaid, or Medicare. °  You generally cannot be eligible for healthcare insurance through your employer.  °  How to apply: °Eligibility screenings are held every Tuesday and Wednesday afternoon from 1:00 pm until 4:00 pm. You do not need an appointment for the interview!  °[facility] [address], [place], [place] 336-631-2330   °[facility]  336-342-8273   °[place]   336-703-3100   °[facility]  336-570-6415   ° °Behavioral Health Resources in the Community: °Intensive Outpatient Programs °Organization         Address  Phone  Notes  °[facility] [address], [place], [place] 336-878-6098   °[facility] [address], [place], [place] 336-832-9800   °[facility] [address], [place], [place] ° 336-882-2125   °[facility] [address],  °[place], [place] 1-800-853-5163 or 336-641-4981   °Substance Abuse Resources °Organization           Address  Phone  Notes  °[facility]  336-882-2125   °[facility]  336-784-9470   °[facility]  336-285-9073   °[facility]  336-845-3988   °Residential & Outpatient Substance Abuse Program  1-800-659-3381   °Psychological Services °Organization         Address  Phone  Notes  °[facility]  336- 832-9600   °[facility]  336- 378-7881   °[facility] [address], [place] 1-800-853-5163 or 336-641-4981   ° °Mobile Crisis Teams °Organization         Address  Phone  Notes  °[facility], Mobile Crisis Care Unit  1-877-626-1772   °Assertive °[facility] ° [address] [place], [place] 336-834-9664   °Sharon DeEsch [address] °[place] 336-554-5454   ° °Self-Help/Support Groups °Organization         Address  Phone             Notes  °[facility] [place] - variety of support groups  336- 373-1402 Call for more information  °Narcotics Anonymous (NA), [facility] [address], °[place]  2 meetings at this location  ° °Residential Treatment Programs °Organization         Address  Phone  Notes  °[facility] [address],    °[place]  1-866-801-8205   °[facility] ° [address], [place], [place] 704-293-8524   °[facility] [address], [place] 336-845-3988 Admissions: 8am-3pm M-F  °[facility] [address]    °[place], [place] 336-841-1104   °[facility] [address], [place], [place] 336-379-7146   °[facility] [address]  °[place], [place] 336-285-9073   °[facility] - Intensive Outpatient [address], [place], [place] 336-852-3033   °[facility] [address]  °[place], [place] 1-877-615-2722 or 336-784-9470   °[facility] [address] [place], [place] 336-227-7417 Accepts Medicaid  °[facility] [address]  °[place] 1-800-659-3381 Substance Abuse/Addiction Treatment  ° °[place] Behavioral Health Resources °Organization         Address  Phone  Notes  °[facility]  (888) 581-9988   °Julie Brannon, PhD [address] [place], [place]   (336) 349-5553 or (336) 951-0000   °[facility]   [address] °[place], [place] (336) 349-4454   °[facility] [address], [place], [place] (336) 342-8316 Insurance/Medicaid/sponsorship through [facility]  °[facility] [address]                                    [place], [place] (336) 342-8316 Therapy/tele-psych/case  °[facility] [address]  ° [place], [place] (336) 349-2233    °Dr. Arfeen  (336) 349-4544   °[facility] [place]  [employer] [place] [facility] 1) [address], [place] °2) [address], [place] °3)  [address], [place] (336) 349-3220 °(336) 342-7768 ° °(336) 342-8140   °[place] [facility] (336) 342-1394 or (336) 342-3537 (After Hours)    ° ° ° °

## 2013-12-01 NOTE — ED Notes (Signed)
Dr. Opitz at bedside. 

## 2013-12-01 NOTE — ED Notes (Signed)
Bed: WA12 Expected date:  Expected time:  Means of arrival:  Comments: Room 4 

## 2013-12-01 NOTE — ED Notes (Signed)
Support person at bedside.  

## 2013-12-01 NOTE — ED Notes (Signed)
Patient resting quietly, lying on her side, eyes closed.

## 2013-12-01 NOTE — ED Notes (Signed)
Patient given fluids. Patient refuses to attempt to drink as "I hurt, if I drink I hurt more". Fluids left at bedside.

## 2013-12-01 NOTE — ED Provider Notes (Signed)
Medical screening examination/treatment/procedure(s) were performed by non-physician practitioner and as supervising physician I was immediately available for consultation/collaboration.  Richarda Blade, MD 12/01/13 [phone]

## 2013-12-01 NOTE — ED Notes (Signed)
Kristen Roberson [phone], cell phone, to be called.

## 2013-12-01 NOTE — ED Notes (Signed)
Patient remains quiet, eyes closed, chest observed for equal rise and fall. NAD noted.

## 2013-12-07 ENCOUNTER — Inpatient Hospital Stay: Payer: Self-pay | Admitting: Internal Medicine

## 2013-12-08 ENCOUNTER — Encounter (HOSPITAL_COMMUNITY): Payer: Self-pay | Admitting: Emergency Medicine

## 2013-12-08 ENCOUNTER — Inpatient Hospital Stay (HOSPITAL_COMMUNITY)
Admission: EM | Admit: 2013-12-08 | Discharge: 2013-12-11 | DRG: 074 | Disposition: A | Discharge status: Home / Self Care | Payer: Self-pay | Attending: Internal Medicine | Admitting: Internal Medicine

## 2013-12-08 DIAGNOSIS — D62 Acute posthemorrhagic anemia: Secondary | ICD-10-CM

## 2013-12-08 DIAGNOSIS — N920 Excessive and frequent menstruation with regular cycle: Secondary | ICD-10-CM

## 2013-12-08 DIAGNOSIS — K760 Fatty (change of) liver, not elsewhere classified: Secondary | ICD-10-CM

## 2013-12-08 DIAGNOSIS — D649 Anemia, unspecified: Secondary | ICD-10-CM

## 2013-12-08 DIAGNOSIS — R109 Unspecified abdominal pain: Secondary | ICD-10-CM

## 2013-12-08 DIAGNOSIS — K226 Gastro-esophageal laceration-hemorrhage syndrome: Secondary | ICD-10-CM

## 2013-12-08 DIAGNOSIS — D638 Anemia in other chronic diseases classified elsewhere: Secondary | ICD-10-CM | POA: Diagnosis present

## 2013-12-08 DIAGNOSIS — E876 Hypokalemia: Secondary | ICD-10-CM | POA: Diagnosis present

## 2013-12-08 DIAGNOSIS — R112 Nausea with vomiting, unspecified: Secondary | ICD-10-CM

## 2013-12-08 DIAGNOSIS — K920 Hematemesis: Secondary | ICD-10-CM

## 2013-12-08 DIAGNOSIS — F10939 Alcohol use, unspecified with withdrawal, unspecified: Secondary | ICD-10-CM | POA: Diagnosis present

## 2013-12-08 DIAGNOSIS — K921 Melena: Secondary | ICD-10-CM

## 2013-12-08 DIAGNOSIS — K6289 Other specified diseases of anus and rectum: Secondary | ICD-10-CM

## 2013-12-08 DIAGNOSIS — IMO0002 Reserved for concepts with insufficient information to code with codable children: Secondary | ICD-10-CM

## 2013-12-08 DIAGNOSIS — R531 Weakness: Secondary | ICD-10-CM | POA: Diagnosis present

## 2013-12-08 DIAGNOSIS — F10239 Alcohol dependence with withdrawal, unspecified: Secondary | ICD-10-CM

## 2013-12-08 DIAGNOSIS — Z9889 Other specified postprocedural states: Secondary | ICD-10-CM

## 2013-12-08 DIAGNOSIS — G621 Alcoholic polyneuropathy: Principal | ICD-10-CM | POA: Diagnosis present

## 2013-12-08 DIAGNOSIS — D529 Folate deficiency anemia, unspecified: Secondary | ICD-10-CM | POA: Diagnosis present

## 2013-12-08 DIAGNOSIS — K648 Other hemorrhoids: Secondary | ICD-10-CM

## 2013-12-08 DIAGNOSIS — E440 Moderate protein-calorie malnutrition: Secondary | ICD-10-CM | POA: Diagnosis present

## 2013-12-08 DIAGNOSIS — N946 Dysmenorrhea, unspecified: Secondary | ICD-10-CM

## 2013-12-08 DIAGNOSIS — R102 Pelvic and perineal pain: Secondary | ICD-10-CM

## 2013-12-08 DIAGNOSIS — K922 Gastrointestinal hemorrhage, unspecified: Secondary | ICD-10-CM

## 2013-12-08 DIAGNOSIS — F419 Anxiety disorder, unspecified: Secondary | ICD-10-CM

## 2013-12-08 DIAGNOSIS — D72819 Decreased white blood cell count, unspecified: Secondary | ICD-10-CM | POA: Diagnosis present

## 2013-12-08 DIAGNOSIS — R209 Unspecified disturbances of skin sensation: Secondary | ICD-10-CM | POA: Diagnosis present

## 2013-12-08 DIAGNOSIS — Q762 Congenital spondylolisthesis: Secondary | ICD-10-CM

## 2013-12-08 DIAGNOSIS — E538 Deficiency of other specified B group vitamins: Secondary | ICD-10-CM

## 2013-12-08 DIAGNOSIS — D61818 Other pancytopenia: Secondary | ICD-10-CM | POA: Diagnosis present

## 2013-12-08 DIAGNOSIS — Z8742 Personal history of other diseases of the female genital tract: Secondary | ICD-10-CM

## 2013-12-08 DIAGNOSIS — R29898 Other symptoms and signs involving the musculoskeletal system: Secondary | ICD-10-CM | POA: Diagnosis present

## 2013-12-08 DIAGNOSIS — F1020 Alcohol dependence, uncomplicated: Secondary | ICD-10-CM | POA: Diagnosis present

## 2013-12-08 MED ORDER — SODIUM CHLORIDE 0.9 % IV BOLUS (SEPSIS)
500.0000 mL | Freq: Once | INTRAVENOUS | Status: AC
  Administered 2013-12-09: 500 mL via INTRAVENOUS

## 2013-12-08 MED ORDER — FENTANYL CITRATE 0.05 MG/ML IJ SOLN
50.0000 ug | Freq: Once | INTRAMUSCULAR | Status: AC
  Administered 2013-12-09: 50 ug via INTRAVENOUS
  Filled 2013-12-08: qty 2

## 2013-12-08 NOTE — ED Notes (Signed)
Per EMS: Pt c/o intermittent leg numbness and tingling/ stabbing feeling in her feet, onset yesterday.

## 2013-12-08 NOTE — ED Notes (Signed)
Bed: BM84 Expected date:  Expected time:  Means of arrival:  Comments: EMS 33F leg numbness/foot stinging

## 2013-12-09 ENCOUNTER — Inpatient Hospital Stay (HOSPITAL_COMMUNITY): Payer: Self-pay

## 2013-12-09 DIAGNOSIS — N946 Dysmenorrhea, unspecified: Secondary | ICD-10-CM

## 2013-12-09 DIAGNOSIS — R109 Unspecified abdominal pain: Secondary | ICD-10-CM

## 2013-12-09 DIAGNOSIS — R531 Weakness: Secondary | ICD-10-CM | POA: Diagnosis present

## 2013-12-09 DIAGNOSIS — D62 Acute posthemorrhagic anemia: Secondary | ICD-10-CM

## 2013-12-09 LAB — RAPID URINE DRUG SCREEN, HOSP PERFORMED
Amphetamines: NOT DETECTED
BARBITURATES: NOT DETECTED
BENZODIAZEPINES: NOT DETECTED
Cocaine: NOT DETECTED
Opiates: NOT DETECTED
Tetrahydrocannabinol: NOT DETECTED

## 2013-12-09 LAB — MAGNESIUM
Magnesium: 1.5 mg/dL (ref 1.5–2.5)
Magnesium: 1.5 mg/dL (ref 1.5–2.5)

## 2013-12-09 LAB — BASIC METABOLIC PANEL
BUN: 4 mg/dL — ABNORMAL LOW (ref 6–23)
BUN: 5 mg/dL — ABNORMAL LOW (ref 6–23)
CHLORIDE: 104 meq/L (ref 96–112)
CO2: 27 mEq/L (ref 19–32)
CO2: 27 mEq/L (ref 19–32)
CREATININE: 0.47 mg/dL — AB (ref 0.50–1.10)
Calcium: 7.6 mg/dL — ABNORMAL LOW (ref 8.4–10.5)
Calcium: 8.3 mg/dL — ABNORMAL LOW (ref 8.4–10.5)
Chloride: 102 mEq/L (ref 96–112)
Creatinine, Ser: 0.42 mg/dL — ABNORMAL LOW (ref 0.50–1.10)
GFR calc Af Amer: >90 mL/min (ref >90)
Glucose, Bld: 117 mg/dL — ABNORMAL HIGH (ref 70–99)
Glucose, Bld: 119 mg/dL — ABNORMAL HIGH (ref 70–99)
POTASSIUM: 3.1 meq/L — AB (ref 3.7–5.3)
Potassium: 2.9 mEq/L — CL (ref 3.7–5.3)
Sodium: 143 mEq/L (ref 137–147)
Sodium: 144 mEq/L (ref 137–147)

## 2013-12-09 LAB — CBC WITH DIFFERENTIAL/PLATELET
BASOS ABS: 0.1 10*3/uL (ref 0.0–0.1)
BASOS PCT: 2 % — AB (ref 0–1)
Eosinophils Absolute: 0 10*3/uL (ref 0.0–0.7)
Eosinophils Relative: 1 % (ref 0–5)
HCT: 27.4 % — ABNORMAL LOW (ref 36.0–46.0)
Hemoglobin: 9.3 g/dL — ABNORMAL LOW (ref 12.0–15.0)
Lymphocytes Relative: 45 % (ref 12–46)
Lymphs Abs: 1.4 10*3/uL (ref 0.7–4.0)
MCH: 31.8 pg (ref 26.0–34.0)
MCHC: 33.9 g/dL (ref 30.0–36.0)
MCV: 93.8 fL (ref 78.0–100.0)
Monocytes Absolute: 0.3 10*3/uL (ref 0.1–1.0)
Monocytes Relative: 10 % (ref 3–12)
NEUTROS PCT: 42 % — AB (ref 43–77)
Neutro Abs: 1.2 10*3/uL — ABNORMAL LOW (ref 1.7–7.7)
Platelets: 152 10*3/uL (ref 150–400)
RBC: 2.92 MIL/uL — ABNORMAL LOW (ref 3.87–5.11)
RDW: 14.6 % (ref 11.5–15.5)
WBC: 2.9 10*3/uL — ABNORMAL LOW (ref 4.0–10.5)

## 2013-12-09 LAB — URINALYSIS, ROUTINE W REFLEX MICROSCOPIC
Bilirubin Urine: NEGATIVE
Glucose, UA: NEGATIVE mg/dL
Hgb urine dipstick: NEGATIVE
KETONES UR: NEGATIVE mg/dL
NITRITE: NEGATIVE
PH: 6 (ref 5.0–8.0)
Protein, ur: NEGATIVE mg/dL
SPECIFIC GRAVITY, URINE: 1.013 (ref 1.005–1.030)
Urobilinogen, UA: 1 mg/dL (ref 0.0–1.0)

## 2013-12-09 LAB — POTASSIUM: Potassium: 2.9 mEq/L — CL (ref 3.7–5.3)

## 2013-12-09 LAB — CBC
HEMATOCRIT: 23.6 % — AB (ref 36.0–46.0)
HEMOGLOBIN: 7.9 g/dL — AB (ref 12.0–15.0)
MCH: 31.2 pg (ref 26.0–34.0)
MCHC: 33.5 g/dL (ref 30.0–36.0)
MCV: 93.3 fL (ref 78.0–100.0)
Platelets: 118 10*3/uL — ABNORMAL LOW (ref 150–400)
RBC: 2.53 MIL/uL — ABNORMAL LOW (ref 3.87–5.11)
RDW: 14.6 % (ref 11.5–15.5)
WBC: 2.6 10*3/uL — AB (ref 4.0–10.5)

## 2013-12-09 LAB — VITAMIN B12: Vitamin B-12: 338 pg/mL (ref 211–911)

## 2013-12-09 LAB — URINE MICROSCOPIC-ADD ON

## 2013-12-09 LAB — GLUCOSE, CAPILLARY: Glucose-Capillary: 101 mg/dL — ABNORMAL HIGH (ref 70–99)

## 2013-12-09 LAB — CK: Total CK: 59 U/L (ref 7–177)

## 2013-12-09 LAB — HEMOGLOBIN A1C
HEMOGLOBIN A1C: 4.4 % (ref <5.7)
MEAN PLASMA GLUCOSE: 80 mg/dL (ref <117)

## 2013-12-09 LAB — ETHANOL: Alcohol, Ethyl (B): 364 mg/dL — ABNORMAL HIGH (ref 0–11)

## 2013-12-09 LAB — TSH: TSH: 2.77 u[IU]/mL (ref 0.350–4.500)

## 2013-12-09 MED ORDER — VITAMIN B-1 100 MG PO TABS
100.0000 mg | ORAL_TABLET | Freq: Every day | ORAL | Status: DC
  Administered 2013-12-10 – 2013-12-11 (×2): 100 mg via ORAL
  Filled 2013-12-09 (×3): qty 1

## 2013-12-09 MED ORDER — ENOXAPARIN SODIUM 40 MG/0.4ML ~~LOC~~ SOLN
40.0000 mg | SUBCUTANEOUS | Status: DC
  Administered 2013-12-09 – 2013-12-11 (×3): 40 mg via SUBCUTANEOUS
  Filled 2013-12-09 (×3): qty 0.4

## 2013-12-09 MED ORDER — LORAZEPAM 2 MG/ML IJ SOLN
0.0000 mg | INTRAMUSCULAR | Status: DC
  Administered 2013-12-09 – 2013-12-11 (×13): 2 mg via INTRAVENOUS
  Filled 2013-12-09 (×13): qty 1

## 2013-12-09 MED ORDER — LORAZEPAM 1 MG PO TABS: 1.0000 mg | ORAL_TABLET | Freq: Four times a day (QID) | ORAL | Status: DC | PRN

## 2013-12-09 MED ORDER — HYDROMORPHONE HCL PF 1 MG/ML IJ SOLN
1.0000 mg | Freq: Once | INTRAMUSCULAR | Status: AC
  Administered 2013-12-09: 1 mg via INTRAVENOUS
  Filled 2013-12-09: qty 1

## 2013-12-09 MED ORDER — FOLIC ACID 1 MG PO TABS
1.0000 mg | ORAL_TABLET | Freq: Every day | ORAL | Status: DC
  Administered 2013-12-10 – 2013-12-11 (×2): 1 mg via ORAL
  Filled 2013-12-09 (×3): qty 1

## 2013-12-09 MED ORDER — LORAZEPAM 1 MG PO TABS: 0.0000 mg | ORAL_TABLET | Freq: Two times a day (BID) | ORAL | Status: DC

## 2013-12-09 MED ORDER — ONDANSETRON HCL 4 MG/2ML IJ SOLN: 4.0000 mg | Freq: Four times a day (QID) | INTRAMUSCULAR | Status: DC | PRN

## 2013-12-09 MED ORDER — MAGNESIUM SULFATE 4000MG/100ML IJ SOLN
4.0000 g | Freq: Once | INTRAMUSCULAR | Status: AC
  Administered 2013-12-09: 4 g via INTRAVENOUS
  Filled 2013-12-09: qty 100

## 2013-12-09 MED ORDER — POTASSIUM CHLORIDE CRYS ER 20 MEQ PO TBCR
40.0000 meq | EXTENDED_RELEASE_TABLET | Freq: Once | ORAL | Status: AC
  Administered 2013-12-09: 40 meq via ORAL
  Filled 2013-12-09: qty 2

## 2013-12-09 MED ORDER — LORAZEPAM 2 MG/ML IJ SOLN
1.0000 mg | Freq: Four times a day (QID) | INTRAMUSCULAR | Status: DC | PRN
  Administered 2013-12-09: 1 mg via INTRAVENOUS
  Filled 2013-12-09: qty 1

## 2013-12-09 MED ORDER — HYDROMORPHONE HCL 2 MG PO TABS
1.0000 mg | ORAL_TABLET | ORAL | Status: DC | PRN
  Administered 2013-12-09 – 2013-12-11 (×10): 1 mg via ORAL
  Filled 2013-12-09 (×10): qty 1

## 2013-12-09 MED ORDER — LOPERAMIDE HCL 2 MG PO CAPS: 2.0000 mg | ORAL_CAPSULE | ORAL | Status: DC | PRN

## 2013-12-09 MED ORDER — POTASSIUM CHLORIDE CRYS ER 20 MEQ PO TBCR
40.0000 meq | EXTENDED_RELEASE_TABLET | Freq: Once | ORAL | Status: AC
  Administered 2013-12-09: 40 meq via ORAL
  Filled 2013-12-09 (×2): qty 2

## 2013-12-09 MED ORDER — LORAZEPAM 1 MG PO TABS: 0.0000 mg | ORAL_TABLET | Freq: Four times a day (QID) | ORAL | Status: DC

## 2013-12-09 MED ORDER — ONDANSETRON HCL 4 MG PO TABS: 4.0000 mg | ORAL_TABLET | Freq: Four times a day (QID) | ORAL | Status: DC | PRN

## 2013-12-09 MED ORDER — ADULT MULTIVITAMIN W/MINERALS CH
1.0000 | ORAL_TABLET | Freq: Every day | ORAL | Status: DC
  Administered 2013-12-10 – 2013-12-11 (×2): 1 via ORAL
  Filled 2013-12-09 (×3): qty 1

## 2013-12-09 MED ORDER — POTASSIUM CHLORIDE CRYS ER 20 MEQ PO TBCR
40.0000 meq | EXTENDED_RELEASE_TABLET | ORAL | Status: AC
  Administered 2013-12-09 (×3): 40 meq via ORAL
  Filled 2013-12-09 (×3): qty 2

## 2013-12-09 MED ORDER — GABAPENTIN 100 MG PO CAPS
100.0000 mg | ORAL_CAPSULE | Freq: Three times a day (TID) | ORAL | Status: DC
  Administered 2013-12-09 – 2013-12-10 (×4): 100 mg via ORAL
  Filled 2013-12-09 (×6): qty 1

## 2013-12-09 NOTE — Progress Notes (Signed)
CRITICAL VALUE ALERT  Critical value received:  Potassium 2.9  Date of notification:  12/09/13  Time of notification:  0632  Critical value read back:yes  Nurse who received alert:  Noreene Larsson RN  MD notified (1st page): Donnal Debar NP  Time of first page:  [phone]  Responding MD: Donnal Debar NP  Time MD responded:  [phone] Orders received for repeat potassium level since pt just received a second dose of oral KCL

## 2013-12-09 NOTE — Progress Notes (Signed)
Patient began to show symptoms of withdrawal. Tremors in legs. High anxiety. Reported this off to Short MD and patient received orders to be transferred to telemetry.   Reported off to [facility]. Patient was stable at time of transfer.

## 2013-12-09 NOTE — H&P (Addendum)
[facility] History and Physical  NYSA SARIN XLK:[id number] DOB: March 18, 1976 DOA: 12/08/2013  Referring physician: ED physician PCP: Angelica Chessman, MD   Chief Complaint: LE weakness and numbness   HPI:  38 year old female with history of proctitis, dysmenorrhea, hematochezia, GI bleeding, fatty liver, alcohol abuse, hematemesis, mallory weiss tear presents to [facility] ED with several days duration of progressively worsening LE weakness and numbness associated with cramping and tingling. She explains this first started in the feet and is now progressively worse and extending towards ankles and knees bilaterally. Pt denies any specific diagnosis of neuropathy. No recent changes in medications, no traumas to the area, no other specific symptoms of chest pain or shortness of breath, no abdominal or urinary concerns. No headaches or visual changes, no other focal neurological symptoms noted, no similar events in the past.   In ED, pt is hemodynamically stable, unable to bear weight and ambulate independently. MRI ordered by ED doctor and [place] asked to admit for further evaluation.   Assessment and Plan: Active Problems: Bilateral LE weakness with numbness - unclear etiology and possibly related to chronic alcohol use, ? Vit B 12 deficiency in the setting of alcohol abuse - MRI lumbar and thoracic spine pending and will be done this AM at 8 - will follow up and we may need to get neurology involved for further assistance - will admit to med floor - provide neurontin - check Vit B 12, UDS, alcohol level, A1C, TSH Hypokalemia - will supplement and check Mg level - repeat BMP in AM Anemia of chronic disease - no signs of active bleeding  - repeat CBC in AM Leukopenia - unclear etiology - monitor with CBC with diff  Alcohol abuse - will need to discuss cessation once pt more medically stable - check Alcohol level and place on CIWA   Radiological Exams on Admission: No results  found.   Code Status: Full Family Communication: Pt at bedside Disposition Plan: Admit for further evaluation     Review of Systems:  Constitutional: Negative for fever, chills and malaise/fatigue. Negative for diaphoresis.  HENT: Negative for hearing loss, ear pain, nosebleeds, congestion, sore throat, neck pain, tinnitus and ear discharge.   Eyes: Negative for blurred vision, double vision, photophobia, pain, discharge and redness.  Respiratory: Negative for cough, hemoptysis, sputum production, shortness of breath, wheezing and stridor.   Cardiovascular: Negative for chest pain, palpitations, orthopnea, claudication and leg swelling.  Gastrointestinal: Negative for nausea, vomiting and abdominal pain. Negative for heartburn, constipation, blood in stool and melena.  Genitourinary: Negative for dysuria, urgency, frequency, hematuria and flank pain.  Musculoskeletal: Negative for myalgias, back pain, joint pain and falls.  Skin: Negative for itching and rash.  Neurological: Negative for dizziness, loss of consciousness and headaches.  Endo/Heme/Allergies: Negative for environmental allergies and polydipsia. Does not bruise/bleed easily.  Psychiatric/Behavioral: Negative for suicidal ideas. The patient is not nervous/anxious.      Past Medical History  Diagnosis Date  . Proctitis   . Cysts of both ovaries   . Seizures   . Anemia   . Anxiety   . Blood transfusion without reported diagnosis   . Depression   . Fatty liver 10/05/13    Past Surgical History  Procedure Laterality Date  . Ovarian cyst removal    . Laparoscopy N/A 09/28/2013    Procedure: LAPAROSCOPY OPERATIVE;  Surgeon: Terrance Mass, MD;  Location: [facility];  Service: Gynecology;  Laterality: N/A;  . Laparoscopic appendectomy Right 09/28/2013  Procedure: APPENDECTOMY LAPAROSCOPIC;  Surgeon: Terrance Mass, MD;  Location: [facility];  Service: Gynecology;  Laterality: Right;  . Salpingoophorectomy Right 09/28/2013     Procedure: SALPINGO OOPHORECTOMY;  Surgeon: Terrance Mass, MD;  Location: [facility];  Service: Gynecology;  Laterality: Right;  . Colonoscopy N/A 09/30/2013    Procedure: COLONOSCOPY;  Surgeon: Lafayette Dragon, MD;  Location: [facility];  Service: Endoscopy;  Laterality: N/A;  . Esophagogastroduodenoscopy N/A 11/23/2013    Procedure: ESOPHAGOGASTRODUODENOSCOPY (EGD);  Surgeon: Jerene Bears, MD;  Location: [facility];  Service: Endoscopy;  Laterality: N/A;    Social History:  reports that she has never smoked. She does not have any smokeless tobacco history on file. She reports that she drinks about 2.4 ounces of alcohol per week. She reports that she does not use illicit drugs.  Allergies  Allergen Reactions  . Morphine And Related Anaphylaxis     Tolerated hydromorphone on 11/25/13.   . Tramadol Other (See Comments)    Seizures   . Penicillins Other (See Comments)    Unknown childhood reaction.    Family History  Problem Relation Age of Onset  . Diabetes Mother   . Hyperlipidemia Mother   . Stroke Mother   . Diabetes Father     Prior to Admission medications   Not on File    Physical Exam: Filed Vitals:   12/08/13 2300  BP: 111/68  Pulse: 100  Temp: 97.8 F (36.6 C)  TempSrc: Oral  Resp: 18  SpO2: 99%    Physical Exam  Constitutional: Appears well-developed and well-nourished. No distress.  HENT: Normocephalic. External right and left ear normal. Oropharynx is clear and moist.  Eyes: Conjunctivae and EOM are normal. PERRLA, no scleral icterus.  Neck: Normal ROM. Neck supple. No JVD. No tracheal deviation. No thyromegaly.  CVS: RRR, S1/S2 +, no murmurs, no gallops, no carotid bruit.  Pulmonary: Effort and breath sounds normal, no stridor, rhonchi, wheezes, rales.  Abdominal: Soft. BS +,  no distension, tenderness, rebound or guarding.  Musculoskeletal: Normal range of motion. No edema and no tenderness.  Lymphadenopathy: No lymphadenopathy noted, cervical,  inguinal. Neuro: Alert. Normal reflexes, strength 5/5 in upper extremities, bilateral feet very tender with exam and pt unable to stand up says she feels like she will fall  Skin: Skin is warm and dry. No rash noted. Not diaphoretic. No erythema. No pallor.  Psychiatric: Normal mood and affect. Behavior, judgment, thought content normal.   Labs on Admission:  Basic Metabolic Panel:  Recent Labs Lab 12/09/13 0010  NA 143  K 3.1*  CL 102  CO2 27  GLUCOSE 119*  BUN 4*  CREATININE 0.47*  CALCIUM 8.3*  MG 1.5   CBC:  Recent Labs Lab 12/09/13 0010  WBC 2.9*  NEUTROABS 1.2*  HGB 9.3*  HCT 27.4*  MCV 93.8  PLT 152   Cardiac Enzymes:  Recent Labs Lab 12/09/13 0010  CKTOTAL 59   EKG: Normal sinus rhythm, no ST/T wave changes  Theodis Blaze, MD  [facility] Pager [phone]  If 7PM-7AM, please contact night-coverage [email or web address] Password [email or web address] 12/09/2013, 3:21 AM

## 2013-12-09 NOTE — ED Notes (Signed)
IV insertion attempted x 2, unable to obtain IV access. Another RN to attempt.

## 2013-12-09 NOTE — Progress Notes (Addendum)
[facility] PROGRESS NOTE  Kristen Roberson ZHG:[id number] DOB: 12/30/1975 DOA: 12/08/2013 PCP: Angelica Chessman, MD  Assessment/Plan  Bilateral LE weakness with numbness, with pins and needles sensation in feet and hands.  Suspect that this is due to hypokalemia, hypomagnesemia, and alcohol abuse especially since she has similar symptoms in her hands, however, her MRI report suggests there may be some nerve root impingement.   - MRI lumbar and thoracic spine demonstrates bilateral L5 spondylolysis with grade 1 spondylolisthesis of 36mm, small fissure at L5-S1, and severe bilateral L5 n. Root impingement in foramina. -  Discussed with Dr. Arnoldo Morale from [facility] who feels MRI findings are chronic and recommends f/u with NSGY as outpatient - continue neurontin and titrate as tolerated -  Vit B 12, A1C,  -  TSH wnl -  UDS neg  -  EtOH 364  Hypokalemia, persistent despite supplementation - Magnesium 1.5, give 4gm IV magnesium sulfate - Start potassium chloride 38meq x 3 doses - Repeat potassium level in AM - check phos with AML  Anemia of chronic disease, hemoglobin trending down -  Monitor for bleeding, if signs of bleeding, repeat CBC sooner, otherwise will recheck in AM  Leukopenia likely due to marrow suppression from EtOH abuse -  Check HIV  Alcohol abuse,  - continue thiamine, folate, MVI - ativan prn -  Move to telemetry once she starts exhibiting signs of withdrawal  Diet:  regular Access:  PIV IVF:  off Proph:  SCDs  Code Status: full Family Communication: patient alone Disposition Plan: will discuss with neurosurgery.  Correct electrolytes   Consultants:  None  Procedures:  MRI thoracic and lumbar spines  Antibiotics:  none   HPI/Subjective:  Still having pins and needles/stabbing sensation in feet made worse with dorsiflexion and little worse with plantar flexion.  Weakness secondary to pain.  Hands also have some pins and needles.  Has hemorrhoids, but  denies changes in bowels or bladder  Objective: Filed Vitals:   12/08/13 2300 12/09/13 0319 12/09/13 0439 12/09/13 0440  BP: 111/68 117/49 116/80 114/75  Pulse: 100 81 84 85  Temp: 97.8 F (36.6 C) 97.4 F (36.3 C) 97.6 F (36.4 C) 97.6 F (36.4 C)  TempSrc: Oral Oral Oral Oral  Resp: 18 21 18 18   SpO2: 99% 100% 100% 100%   No intake or output data in the 24 hours ending 12/09/13 0950 There were no vitals filed for this visit.  Exam:   General:  Thin CF, No acute distress  HEENT:  NCAT, MMM  Cardiovascular:  RRR, nl S1, S2 no mrg, 2+ pulses, warm extremities  Respiratory:  CTAB, no increased WOB  Abdomen:   NABS, soft, NT/ND  MSK:   Normal tone and bulk, no LEE  Neuro:  CN II-XII grossly intact, strength upper extremities 5/5, hips and knee extension/flexion bilaterally 5/5, foot plantar flexion bilaterally 4/5 and 3/5 with dorsiflexion but limited by pain.  Has sensation to light touch on hands and feet.    Data Reviewed: Basic Metabolic Panel:  Recent Labs Lab 12/09/13 0010 12/09/13 0545 12/09/13 0810  NA 143 144  --   K 3.1* 2.9* 2.9*  CL 102 104  --   CO2 27 27  --   GLUCOSE 119* 117*  --   BUN 4* 5*  --   CREATININE 0.47* 0.42*  --   CALCIUM 8.3* 7.6*  --   MG 1.5 1.5  --    Liver Function Tests: No results found for this  basename: AST, ALT, ALKPHOS, BILITOT, PROT, ALBUMIN,  in the last 168 hours No results found for this basename: LIPASE, AMYLASE,  in the last 168 hours No results found for this basename: AMMONIA,  in the last 168 hours CBC:  Recent Labs Lab 12/09/13 0010 12/09/13 0545  WBC 2.9* 2.6*  NEUTROABS 1.2*  --   HGB 9.3* 7.9*  HCT 27.4* 23.6*  MCV 93.8 93.3  PLT 152 118*   Cardiac Enzymes:  Recent Labs Lab 12/09/13 0010  CKTOTAL 59   BNP (last 3 results)  Recent Labs  11/28/13 2059  PROBNP 39.4   CBG: No results found for this basename: GLUCAP,  in the last 168 hours  No results found for this or any previous  visit (from the past 240 hour(s)).   Studies: No results found.  Scheduled Meds: . enoxaparin (LOVENOX) injection  40 mg Subcutaneous Q24H  . folic acid  1 mg Oral Daily  . gabapentin  100 mg Oral TID  . magnesium sulfate 1 - 4 g bolus IVPB  4 g Intravenous Once  . multivitamin with minerals  1 tablet Oral Daily  . potassium chloride  40 mEq Oral Q4H  . thiamine  100 mg Oral Daily   Continuous Infusions:   Active Problems:   Weakness    Time spent: 30 min    [facility] Pager [phone]. If 7PM-7AM, please contact night-coverage at [email or web address], password [email or web address] 12/09/2013, 9:50 AM  LOS: 1 day

## 2013-12-09 NOTE — ED Provider Notes (Signed)
CSN: [id number]     Arrival date & time 12/08/13  2255 History   First MD Initiated Contact with Patient 12/08/13 2259     Chief Complaint  Patient presents with  . leg numbness      (Consider location/radiation/quality/duration/timing/severity/associated sxs/prior Treatment) HPI Comments: 38 year old female with history of proctitis, dysmenorrhea, hematochezia, GI bleeding, fatty liver, alcohol abuse, hematemesis, mallory weiss tear presents with intermittent leg numbness bilateral and tingling/stabbing pain in her feet bilateral. Patient has had this intermittently for almost a month however is found significantly worse the past 2-3 days. Patient has had similar with possible low potassium. No diagnosis of neuropathy or diabetes. No new medications. Patient denies back surgeries or back pain. Patient denies IV drug use, fevers or active cancer. Pain is worse with palpation and mild swelling of feet has been for a few months. No known kidney or heart failure. Patient drinks alcohol regularly and did have an alcohol beverage today. Patient has had intermittent brighter blood (and vomit that similar to multiple previous episodes. Patient has had EGD and, in the past with gastritis, small tears and bleeding hemorrhoids. Patient denies blood thinners or ligatures.  The history is provided by the patient.    Past Medical History  Diagnosis Date  . Proctitis   . Cysts of both ovaries   . Seizures   . Anemia   . Anxiety   . Blood transfusion without reported diagnosis   . Depression   . Fatty liver 10/05/13   Past Surgical History  Procedure Laterality Date  . Ovarian cyst removal    . Laparoscopy N/A 09/28/2013    Procedure: LAPAROSCOPY OPERATIVE;  Surgeon: Terrance Mass, MD;  Location: [facility];  Service: Gynecology;  Laterality: N/A;  . Laparoscopic appendectomy Right 09/28/2013    Procedure: APPENDECTOMY LAPAROSCOPIC;  Surgeon: Terrance Mass, MD;  Location: [facility];  Service:  Gynecology;  Laterality: Right;  . Salpingoophorectomy Right 09/28/2013    Procedure: SALPINGO OOPHORECTOMY;  Surgeon: Terrance Mass, MD;  Location: [facility];  Service: Gynecology;  Laterality: Right;  . Colonoscopy N/A 09/30/2013    Procedure: COLONOSCOPY;  Surgeon: Lafayette Dragon, MD;  Location: [facility];  Service: Endoscopy;  Laterality: N/A;  . Esophagogastroduodenoscopy N/A 11/23/2013    Procedure: ESOPHAGOGASTRODUODENOSCOPY (EGD);  Surgeon: Jerene Bears, MD;  Location: [facility];  Service: Endoscopy;  Laterality: N/A;   Family History  Problem Relation Age of Onset  . Diabetes Mother   . Hyperlipidemia Mother   . Stroke Mother   . Diabetes Father    History  Substance Use Topics  . Smoking status: Never Smoker   . Smokeless tobacco: Not on file  . Alcohol Use: 2.4 oz/week    4 Glasses of wine per week   OB History   Grav Para Term Preterm Abortions TAB SAB Ect Mult Living   7 3   4  4   3      Review of Systems  Constitutional: Negative for fever and chills.  HENT: Negative for congestion.   Eyes: Negative for visual disturbance.  Respiratory: Negative for shortness of breath.   Cardiovascular: Negative for chest pain.  Gastrointestinal: Positive for diarrhea and anal bleeding. Negative for vomiting and abdominal pain.  Genitourinary: Positive for dysuria. Negative for flank pain.  Musculoskeletal: Negative for back pain, neck pain and neck stiffness.  Skin: Negative for rash.  Neurological: Positive for weakness and numbness. Negative for light-headedness and headaches.      Allergies  Morphine and related; Tramadol; and Penicillins  Home Medications   Prior to Admission medications   Not on File   BP 111/68  Pulse 100  Temp(Src) 97.8 F (36.6 C) (Oral)  Resp 18  SpO2 99%  LMP 09/25/2013 Physical Exam  Nursing note and vitals reviewed. Constitutional: She is oriented to person, place, and time. She appears well-developed and well-nourished.  HENT:   Head: Normocephalic and atraumatic.  Eyes: Conjunctivae are normal. Right eye exhibits no discharge. Left eye exhibits no discharge.  Neck: Normal range of motion. Neck supple. No tracheal deviation present.  Cardiovascular: Normal rate and regular rhythm.   Pulmonary/Chest: Effort normal and breath sounds normal.  Abdominal: Soft. She exhibits no distension. There is no tenderness. There is no guarding.  Musculoskeletal: She exhibits tenderness. She exhibits no edema.  Neurological: She is alert and oriented to person, place, and time. GCS eye subscore is 4. GCS verbal subscore is 5. GCS motor subscore is 6.  Patient has 5+ strength in upper extremities bilateral with flexion extension at major joints. Sensation intact bilateral upper extremities. Lower extremities patient has 4+ strength bilateral lower extremities with flexion and extension of hips knees ankles and great toe. Initially I felt to effort related as patient would not move anything however she was able to move all major joints however there was mild weakness bilateral. Patient did have pain with movement as well. No sign of infection no significant leg edema. Equal reflexes bilateral. Patient hypersensitive to pain the dorsum of the foot. Patient has good rectal tone with hemorrhoids and perirectal sensation intact.  Skin: Skin is warm. No rash noted.  Psychiatric: She has a normal mood and affect.    ED Course  Procedures (including critical care time) Labs Review Labs Reviewed  CBC WITH DIFFERENTIAL - Abnormal; Notable for the following:    WBC 2.9 (*)    RBC 2.92 (*)    Hemoglobin 9.3 (*)    HCT 27.4 (*)    Neutrophils Relative % 42 (*)    Neutro Abs 1.2 (*)    Basophils Relative 2 (*)    All other components within normal limits  BASIC METABOLIC PANEL - Abnormal; Notable for the following:    Potassium 3.1 (*)    Glucose, Bld 119 (*)    BUN 4 (*)    Creatinine, Ser 0.47 (*)    Calcium 8.3 (*)    All other  components within normal limits  URINALYSIS, ROUTINE W REFLEX MICROSCOPIC - Abnormal; Notable for the following:    APPearance CLOUDY (*)    Leukocytes, UA TRACE (*)    All other components within normal limits  URINE MICROSCOPIC-ADD ON - Abnormal; Notable for the following:    Bacteria, UA FEW (*)    All other components within normal limits  ETHANOL - Abnormal; Notable for the following:    Alcohol, Ethyl (B) 364 (*)    All other components within normal limits  BASIC METABOLIC PANEL - Abnormal; Notable for the following:    Potassium 2.9 (*)    Glucose, Bld 117 (*)    BUN 5 (*)    Creatinine, Ser 0.42 (*)    Calcium 7.6 (*)    All other components within normal limits  CBC - Abnormal; Notable for the following:    WBC 2.6 (*)    RBC 2.53 (*)    Hemoglobin 7.9 (*)    HCT 23.6 (*)    Platelets 118 (*)    All  other components within normal limits  POTASSIUM - Abnormal; Notable for the following:    Potassium 2.9 (*)    All other components within normal limits  BASIC METABOLIC PANEL - Abnormal; Notable for the following:    Glucose, Bld 112 (*)    BUN 5 (*)    Creatinine, Ser 0.46 (*)    Calcium 7.6 (*)    All other components within normal limits  CBC - Abnormal; Notable for the following:    WBC 2.1 (*)    RBC 2.61 (*)    Hemoglobin 8.1 (*)    HCT 24.8 (*)    All other components within normal limits  GLUCOSE, CAPILLARY - Abnormal; Notable for the following:    Glucose-Capillary 101 (*)    All other components within normal limits  CK  MAGNESIUM  TSH  URINE RAPID DRUG SCREEN (HOSP PERFORMED)  MAGNESIUM  HEMOGLOBIN A1C  VITAMIN B12  PHOSPHORUS  HIV ANTIBODY (ROUTINE TESTING)    Imaging Review Mr Thoracic Spine Wo Contrast  12/09/2013   CLINICAL DATA:  Bilateral lower extremity weakness and numbness with cramping and tingling.  EXAM: MRI THORACIC AND LUMBAR SPINE WITHOUT CONTRAST  TECHNIQUE: Multiplanar and multiecho pulse sequences of the thoracic and lumbar  spine were obtained without intravenous contrast.  COMPARISON:  None.  FINDINGS: MR THORACIC SPINE FINDINGS  There is no evidence for disc degeneration, disc herniation, vertebral body abnormality or paraspinous mass. For cortical normal size and signal throughout. There is no apparent tonsillar herniation or hydromyelia. No intraspinal mass lesion is evident. Axial images through the individual disk spaces reveal no compressive abnormality.  MR LUMBAR SPINE FINDINGS  The alignment is anatomic except for 8 mm of anterolisthesis L5 on S1 related to bilateral L5 pars defects. Mild facet arthropathy is present at this level. There is no vertebral body compression fracture or worrisome osseous lesion. The conus is normal. No paravertebral mass lesion.  The individual disc spaces were examined with axial images as follows:  L1-L2:  Normal.  L2-L3:  Normal.  L3-L4:  Normal.  L4-L5:  Mild bulge.  Mild facet arthropathy.  No impingement.  L5-S1: Small annular fissure centrally without frank disc protrusion. Bilateral L5 spondylolysis with grade 1 spondylolisthesis of 8 mm. No S1 nerve root impingement in the canal. Severe bilateral foraminal narrowing compresses both L5 nerve roots. This could potentially be worse with patient standing in flexion.  IMPRESSION: MR THORACIC SPINE IMPRESSION  No thoracic disc protrusion or compression deformity. Normal cord size without intraspinal mass lesion. No evidence for transverse myelitis or significant thoracic disc protrusion.  MR LUMBAR SPINE IMPRESSION  Bilateral L5 spondylolysis with grade 1 spondylolisthesis of 8 mm. Small annular fissure at L5-S1. Severe bilateral L5 nerve root impingement in the foramina.  No conus compression or intraspinal mass lesion.   Electronically Signed   By: Rolla Flatten M.D.   On: 12/09/2013 10:17   Mr Lumbar Spine Wo Contrast  12/09/2013   CLINICAL DATA:  Bilateral lower extremity weakness and numbness with cramping and tingling.  EXAM: MRI  THORACIC AND LUMBAR SPINE WITHOUT CONTRAST  TECHNIQUE: Multiplanar and multiecho pulse sequences of the thoracic and lumbar spine were obtained without intravenous contrast.  COMPARISON:  None.  FINDINGS: MR THORACIC SPINE FINDINGS  There is no evidence for disc degeneration, disc herniation, vertebral body abnormality or paraspinous mass. For cortical normal size and signal throughout. There is no apparent tonsillar herniation or hydromyelia. No intraspinal mass lesion is evident. Axial images  through the individual disk spaces reveal no compressive abnormality.  MR LUMBAR SPINE FINDINGS  The alignment is anatomic except for 8 mm of anterolisthesis L5 on S1 related to bilateral L5 pars defects. Mild facet arthropathy is present at this level. There is no vertebral body compression fracture or worrisome osseous lesion. The conus is normal. No paravertebral mass lesion.  The individual disc spaces were examined with axial images as follows:  L1-L2:  Normal.  L2-L3:  Normal.  L3-L4:  Normal.  L4-L5:  Mild bulge.  Mild facet arthropathy.  No impingement.  L5-S1: Small annular fissure centrally without frank disc protrusion. Bilateral L5 spondylolysis with grade 1 spondylolisthesis of 8 mm. No S1 nerve root impingement in the canal. Severe bilateral foraminal narrowing compresses both L5 nerve roots. This could potentially be worse with patient standing in flexion.  IMPRESSION: MR THORACIC SPINE IMPRESSION  No thoracic disc protrusion or compression deformity. Normal cord size without intraspinal mass lesion. No evidence for transverse myelitis or significant thoracic disc protrusion.  MR LUMBAR SPINE IMPRESSION  Bilateral L5 spondylolysis with grade 1 spondylolisthesis of 8 mm. Small annular fissure at L5-S1. Severe bilateral L5 nerve root impingement in the foramina.  No conus compression or intraspinal mass lesion.   Electronically Signed   By: Rolla Flatten M.D.   On: 12/09/2013 10:17   he for home   EKG  Interpretation None      MDM   Final diagnoses:  Abdominal pain  Acute blood loss anemia  Dysmenorrhea  Weakness  Hypo K Patient has worsening foot pain bilateral clinically likely neuropathy with broad differential including alcohol-related/other. Patient does have mild weakness lower extremities and both of these symptoms are not new tonight. Plan for pain meds and blood work, CK and likely MRI in the morning versus admission. Discussed with hospitalist for observation an MRI later in the day. Anemia similar to previous concern for ulcer. K given in ED.   Patient admitted to the hospital. He The patients results and plan were reviewed and discussed.   Any x-rays performed were personally reviewed by myself.   Differential diagnosis were considered with the presenting HPI.    Filed Vitals:   12/10/13 2052 12/11/13 0007 12/11/13 0300 12/11/13 0551  BP: 150/96 124/89 143/88 148/90  Pulse: 95 80 110 68  Temp:  98.2 F (36.8 C)  97.8 F (36.6 C)  TempSrc:  Oral  Oral  Resp:  12  16  Height:      Weight:      SpO2:  99%  100%    Admission/ observation were discussed with the admitting physician, patient and/or family and they are comfortable with the plan.      Mariea Clonts, MD 12/11/13 [phone]

## 2013-12-10 DIAGNOSIS — R5381 Other malaise: Secondary | ICD-10-CM

## 2013-12-10 DIAGNOSIS — E440 Moderate protein-calorie malnutrition: Secondary | ICD-10-CM | POA: Insufficient documentation

## 2013-12-10 DIAGNOSIS — R5383 Other fatigue: Secondary | ICD-10-CM

## 2013-12-10 LAB — BASIC METABOLIC PANEL
BUN: 5 mg/dL — ABNORMAL LOW (ref 6–23)
CALCIUM: 7.6 mg/dL — AB (ref 8.4–10.5)
CO2: 24 mEq/L (ref 19–32)
CREATININE: 0.46 mg/dL — AB (ref 0.50–1.10)
Chloride: 102 mEq/L (ref 96–112)
GFR calc Af Amer: >90 mL/min (ref >90)
GLUCOSE: 112 mg/dL — AB (ref 70–99)
Potassium: 3.8 mEq/L (ref 3.7–5.3)
Sodium: 139 mEq/L (ref 137–147)

## 2013-12-10 LAB — PHOSPHORUS: PHOSPHORUS: 3.7 mg/dL (ref 2.3–4.6)

## 2013-12-10 LAB — HIV ANTIBODY (ROUTINE TESTING W REFLEX): HIV 1&2 Ab, 4th Generation: NONREACTIVE

## 2013-12-10 MED ORDER — VITAMIN B-12 1000 MCG PO TABS
1000.0000 ug | ORAL_TABLET | Freq: Every day | ORAL | Status: DC
  Administered 2013-12-10 – 2013-12-11 (×2): 1000 ug via ORAL
  Filled 2013-12-10 (×2): qty 1

## 2013-12-10 MED ORDER — GABAPENTIN 100 MG PO CAPS
200.0000 mg | ORAL_CAPSULE | Freq: Three times a day (TID) | ORAL | Status: DC
  Administered 2013-12-10 – 2013-12-11 (×3): 200 mg via ORAL
  Filled 2013-12-10 (×5): qty 2

## 2013-12-10 MED ORDER — POTASSIUM CHLORIDE CRYS ER 20 MEQ PO TBCR
20.0000 meq | EXTENDED_RELEASE_TABLET | Freq: Every day | ORAL | Status: DC
  Administered 2013-12-10 – 2013-12-11 (×2): 20 meq via ORAL
  Filled 2013-12-10 (×2): qty 1

## 2013-12-10 MED ORDER — ENSURE COMPLETE PO LIQD
237.0000 mL | Freq: Two times a day (BID) | ORAL | Status: DC
  Administered 2013-12-10 – 2013-12-11 (×3): 237 mL via ORAL

## 2013-12-10 NOTE — Progress Notes (Addendum)
INITIAL NUTRITION ASSESSMENT  DOCUMENTATION CODES Per approved criteria  -Non-severe (moderate) malnutrition in the context of social or environmental circumstances   INTERVENTION:  Continue Regular diet and provide preferences and snacks as desired.  Continue MVI, thiamine, Vitamin B-12 and folate daily Ensure Complete po BID, each supplement provides 350 kcal and 13 grams of protein Encouraged the importance of good nutrition, discussed sources of protein,and need for 3 meals daily. Provided handout on Sobriety Nutrition and patient verbalized knowing that this was the number one thing she needed to change. RD to monitor   NUTRITION DIAGNOSIS: Predicted sub optimal intake related to alcohol abuse as evidenced by diet hx..   Goal: Intake of meals and supplements to meet >90% estimated needs.  Monitor:  Intake, labs, weight trend.  Reason for Assessment: Consult for assessment and evaluation of needs and requirements.  MST.  38 y.o. female  Admitting Dx: <principal problem not specified>  ASSESSMENT: Patient admitted with bilateral weakness with numbness and pins and needle sensations in feet and hands, anemia of chronic disease.  Folate was <1 last month, hemoglobin low but stable.  ETOH abuse.  Leukopenia.  Patient reports decreased appetite and intake with 50% intake of meals currently.  Has drank Ensure and [employer] prior to admit.  Usually eats only 1 meal per day and snacks on fruit.    Patient meets criteria for mild/moderate malnutrition related to social/environmental factors AEB mild decrease of body fat and muscle mass along with intake <75% of estimated needs for >/= 3 months.  Nutrition Focused Physical Exam:  Subcutaneous Fat:  Orbital Region: mild Upper Arm Region: mild Thoracic and Lumbar Region: wnl  Muscle:  Temple Region: mild Clavicle Bone Region: mild Clavicle and Acromion Bone Region: mild Scapular Bone Region: moderate to  severe Dorsal Hand: wnl Patellar Region: wnl Anterior Thigh Region: mild Posterior Calf Region: mild  Edema: none noted     Height: Ht Readings from Last 1 Encounters:  12/09/13 5\' 6"  (1.676 m)    Weight: Wt Readings from Last 1 Encounters:  12/09/13 120 lb (54.432 kg)    Ideal Body Weight: 130 lbs  % Ideal Body Weight: 92  Wt Readings from Last 10 Encounters:  12/09/13 120 lb (54.432 kg)  11/30/13 120 lb (54.432 kg)  11/28/13 120 lb (54.432 kg)  11/25/13 123 lb 4.8 oz (55.929 kg)  11/24/13 134 lb 0.6 oz (60.8 kg)  11/24/13 134 lb 0.6 oz (60.8 kg)  10/29/13 129 lb 3 oz (58.6 kg)  09/28/13 120 lb (54.432 kg)  09/28/13 120 lb (54.432 kg)  09/23/13 120 lb (54.432 kg)    Usual Body Weight: 125 lbs  % Usual Body Weight: 96  BMI:  Body mass index is 19.38 kg/(m^2).  Estimated Nutritional Needs: Kcal: 1500-1700 kcal  Protein: 65-75 gm Fluid: 1.5L  Skin: intact  Diet Order: General  EDUCATION NEEDS: -Education needs addressed   Intake/Output Summary (Last 24 hours) at 12/10/13 1417 Last data filed at 12/10/13 0930  Gross per 24 hour  Intake    340 ml  Output      4 ml  Net    336 ml     Labs:   Recent Labs Lab 12/09/13 0010 12/09/13 0545 12/09/13 0810 12/10/13 0340  NA 143 144  --  139  K 3.1* 2.9* 2.9* 3.8  CL 102 104  --  102  CO2 27 27  --  24  BUN 4* 5*  --  5*  CREATININE  0.47* 0.42*  --  0.46*  CALCIUM 8.3* 7.6*  --  7.6*  MG 1.5 1.5  --   --   PHOS  --   --   --  3.7  GLUCOSE 119* 117*  --  112*    CBG (last 3)   Recent Labs  12/09/13 2142  GLUCAP 101*    Scheduled Meds: . enoxaparin (LOVENOX) injection  40 mg Subcutaneous Q24H  . feeding supplement (ENSURE COMPLETE)  237 mL Oral BID BM  . folic acid  1 mg Oral Daily  . gabapentin  200 mg Oral TID  . LORazepam  0-4 mg Intravenous Q3H  . multivitamin with minerals  1 tablet Oral Daily  . potassium chloride  20 mEq Oral Daily  . thiamine  100 mg Oral Daily  . vitamin  B-12  1,000 mcg Oral Daily    Continuous Infusions:   Past Medical History  Diagnosis Date  . Proctitis   . Cysts of both ovaries   . Seizures   . Anemia   . Anxiety   . Blood transfusion without reported diagnosis   . Depression   . Fatty liver 10/05/13    Past Surgical History  Procedure Laterality Date  . Ovarian cyst removal    . Laparoscopy N/A 09/28/2013    Procedure: LAPAROSCOPY OPERATIVE;  Surgeon: Terrance Mass, MD;  Location: [facility];  Service: Gynecology;  Laterality: N/A;  . Laparoscopic appendectomy Right 09/28/2013    Procedure: APPENDECTOMY LAPAROSCOPIC;  Surgeon: Terrance Mass, MD;  Location: [facility];  Service: Gynecology;  Laterality: Right;  . Salpingoophorectomy Right 09/28/2013    Procedure: SALPINGO OOPHORECTOMY;  Surgeon: Terrance Mass, MD;  Location: [facility];  Service: Gynecology;  Laterality: Right;  . Colonoscopy N/A 09/30/2013    Procedure: COLONOSCOPY;  Surgeon: Lafayette Dragon, MD;  Location: [facility];  Service: Endoscopy;  Laterality: N/A;  . Esophagogastroduodenoscopy N/A 11/23/2013    Procedure: ESOPHAGOGASTRODUODENOSCOPY (EGD);  Surgeon: Jerene Bears, MD;  Location: [facility];  Service: Endoscopy;  Laterality: N/A;    Antonieta Iba, RD, LDN Clinical Inpatient Dietitian Pager:  [phone] Weekend and after hours pager:  [phone]

## 2013-12-10 NOTE — Progress Notes (Signed)
[facility] PROGRESS NOTE  Kristen Roberson PFX:[id number] DOB: 07/05/1976 DOA: 12/08/2013 PCP: Angelica Chessman, MD  Assessment/Plan  Bilateral LE weakness with numbness, with pins and needles sensation in feet and hands.  Suspect that this is due to hypokalemia, hypomagnesemia, and alcohol abuse especially since she has similar symptoms in her hands.   - MRI lumbar and thoracic spine demonstrates bilateral L5 spondylolysis with grade 1 spondylolisthesis of 31mm, small fissure at L5-S1, and severe bilateral L5 n. Root impingement in foramina.  Discussed with Dr. Arnoldo Morale from [place] who feels MRI findings are chronic and recommends f/u with NSGY as outpatient -  Increase neurontin  -  Vit B 12 338, A1C 4.4  -  TSH 2.77 -  UDS neg  -  EtOH 364  Hypokalemia, resolved - Magnesium 1.5, give 4gm IV magnesium sulfate  - Start daily potassium supplementation - phos wnl  Anemia of chronic disease, EtOH and folic acid deficiency, hemoglobin stable around 8.1.  Iron studies wnl last month, but folate < 1.   -  Encouraged to quit alcohol -  Continue folate supplements -  Start vitamin B12 supplementation  Leukopenia likely due to marrow suppression from EtOH abuse -  Check HIV  Alcohol abuse,  - continue thiamine, folate, MVI - Continue CIWA, scores 14 > 10 > 10 > 7 - Telemetry:  NSR with PVC -  Social work consult  Diet:  regular Access:  PIV IVF:  off Proph:  SCDs  Code Status: full Family Communication: patient alone Disposition Plan:  Continue CIWA, scores are too high and receiving too much ativan over last 24 hours to discharge safely.  PT consult pending  Consultants:  None  Procedures:  MRI thoracic and lumbar spines  Antibiotics:  none   HPI/Subjective:  States her feet and hands feel a little better.  Pins and needles and numbness only of the toes currently.  Able to walk to bathroom with assistance.  Unsteady per RN.    Objective: Filed Vitals:   12/09/13  2100 12/09/13 2336 12/10/13 0253 12/10/13 0600  BP: 121/76 124/87 123/77 121/87  Pulse:  73  75  Temp:  97.8 F (36.6 C)  98 F (36.7 C)  TempSrc:  Oral  Oral  Resp:  20  20  Height:  5\' 6"  (1.676 m)    Weight:  54.432 kg (120 lb)    SpO2:  100%  100%    Intake/Output Summary (Last 24 hours) at 12/10/13 0959 Last data filed at 12/10/13 0417  Gross per 24 hour  Intake    220 ml  Output      2 ml  Net    218 ml   Filed Weights   12/09/13 2336  Weight: 54.432 kg (120 lb)    Exam:   General:  Thin CF, No acute distress  HEENT:  NCAT, MMM  Cardiovascular:  RRR, nl S1, S2 no mrg, 2+ pulses, warm extremities  Respiratory:  CTAB, no increased WOB  Abdomen:   NABS, soft, NT/ND  MSK:   Normal tone and bulk, no LEE  Neuro:  CN II-XII grossly intact, strength upper extremities 5/5, hips and knee extension/flexion bilaterally 5/5, foot plantar flexion bilaterally 5/5 and 5/5 with dorsiflexion.  Has sensation to light touch on hands and feet.  Shakes legs during interview and exam, however, this is distractable behavior.  Stops when patient thinks no one is watching and when asked to do other complicated tasks  Data Reviewed: Basic Metabolic  Panel:  Recent Labs Lab 12/09/13 0010 12/09/13 0545 12/09/13 0810 12/10/13 0340  NA 143 144  --  139  K 3.1* 2.9* 2.9* 3.8  CL 102 104  --  102  CO2 27 27  --  24  GLUCOSE 119* 117*  --  112*  BUN 4* 5*  --  5*  CREATININE 0.47* 0.42*  --  0.46*  CALCIUM 8.3* 7.6*  --  7.6*  MG 1.5 1.5  --   --   PHOS  --   --   --  3.7   Liver Function Tests: No results found for this basename: AST, ALT, ALKPHOS, BILITOT, PROT, ALBUMIN,  in the last 168 hours No results found for this basename: LIPASE, AMYLASE,  in the last 168 hours No results found for this basename: AMMONIA,  in the last 168 hours CBC:  Recent Labs Lab 12/09/13 0010 12/09/13 0545 12/10/13 0340  WBC 2.9* 2.6* 2.1*  NEUTROABS 1.2*  --   --   HGB 9.3* 7.9* 8.1*  HCT  27.4* 23.6* 24.8*  MCV 93.8 93.3 95.0  PLT 152 118* CONSISTENT WITH PREVIOUS RESULT   Cardiac Enzymes:  Recent Labs Lab 12/09/13 0010  CKTOTAL 59   BNP (last 3 results)  Recent Labs  11/28/13 2059  PROBNP 39.4   CBG:  Recent Labs Lab 12/09/13 2142  GLUCAP 101*    No results found for this or any previous visit (from the past 240 hour(s)).   Studies: Mr Thoracic Spine Wo Contrast  12/09/2013   CLINICAL DATA:  Bilateral lower extremity weakness and numbness with cramping and tingling.  EXAM: MRI THORACIC AND LUMBAR SPINE WITHOUT CONTRAST  TECHNIQUE: Multiplanar and multiecho pulse sequences of the thoracic and lumbar spine were obtained without intravenous contrast.  COMPARISON:  None.  FINDINGS: MR THORACIC SPINE FINDINGS  There is no evidence for disc degeneration, disc herniation, vertebral body abnormality or paraspinous mass. For cortical normal size and signal throughout. There is no apparent tonsillar herniation or hydromyelia. No intraspinal mass lesion is evident. Axial images through the individual disk spaces reveal no compressive abnormality.  MR LUMBAR SPINE FINDINGS  The alignment is anatomic except for 8 mm of anterolisthesis L5 on S1 related to bilateral L5 pars defects. Mild facet arthropathy is present at this level. There is no vertebral body compression fracture or worrisome osseous lesion. The conus is normal. No paravertebral mass lesion.  The individual disc spaces were examined with axial images as follows:  L1-L2:  Normal.  L2-L3:  Normal.  L3-L4:  Normal.  L4-L5:  Mild bulge.  Mild facet arthropathy.  No impingement.  L5-S1: Small annular fissure centrally without frank disc protrusion. Bilateral L5 spondylolysis with grade 1 spondylolisthesis of 8 mm. No S1 nerve root impingement in the canal. Severe bilateral foraminal narrowing compresses both L5 nerve roots. This could potentially be worse with patient standing in flexion.  IMPRESSION: MR THORACIC SPINE  IMPRESSION  No thoracic disc protrusion or compression deformity. Normal cord size without intraspinal mass lesion. No evidence for transverse myelitis or significant thoracic disc protrusion.  MR LUMBAR SPINE IMPRESSION  Bilateral L5 spondylolysis with grade 1 spondylolisthesis of 8 mm. Small annular fissure at L5-S1. Severe bilateral L5 nerve root impingement in the foramina.  No conus compression or intraspinal mass lesion.   Electronically Signed   By: Rolla Flatten M.D.   On: 12/09/2013 10:17   Mr Lumbar Spine Wo Contrast  12/09/2013   CLINICAL DATA:  Bilateral lower  extremity weakness and numbness with cramping and tingling.  EXAM: MRI THORACIC AND LUMBAR SPINE WITHOUT CONTRAST  TECHNIQUE: Multiplanar and multiecho pulse sequences of the thoracic and lumbar spine were obtained without intravenous contrast.  COMPARISON:  None.  FINDINGS: MR THORACIC SPINE FINDINGS  There is no evidence for disc degeneration, disc herniation, vertebral body abnormality or paraspinous mass. For cortical normal size and signal throughout. There is no apparent tonsillar herniation or hydromyelia. No intraspinal mass lesion is evident. Axial images through the individual disk spaces reveal no compressive abnormality.  MR LUMBAR SPINE FINDINGS  The alignment is anatomic except for 8 mm of anterolisthesis L5 on S1 related to bilateral L5 pars defects. Mild facet arthropathy is present at this level. There is no vertebral body compression fracture or worrisome osseous lesion. The conus is normal. No paravertebral mass lesion.  The individual disc spaces were examined with axial images as follows:  L1-L2:  Normal.  L2-L3:  Normal.  L3-L4:  Normal.  L4-L5:  Mild bulge.  Mild facet arthropathy.  No impingement.  L5-S1: Small annular fissure centrally without frank disc protrusion. Bilateral L5 spondylolysis with grade 1 spondylolisthesis of 8 mm. No S1 nerve root impingement in the canal. Severe bilateral foraminal narrowing compresses  both L5 nerve roots. This could potentially be worse with patient standing in flexion.  IMPRESSION: MR THORACIC SPINE IMPRESSION  No thoracic disc protrusion or compression deformity. Normal cord size without intraspinal mass lesion. No evidence for transverse myelitis or significant thoracic disc protrusion.  MR LUMBAR SPINE IMPRESSION  Bilateral L5 spondylolysis with grade 1 spondylolisthesis of 8 mm. Small annular fissure at L5-S1. Severe bilateral L5 nerve root impingement in the foramina.  No conus compression or intraspinal mass lesion.   Electronically Signed   By: Rolla Flatten M.D.   On: 12/09/2013 10:17    Scheduled Meds: . enoxaparin (LOVENOX) injection  40 mg Subcutaneous Q24H  . folic acid  1 mg Oral Daily  . gabapentin  100 mg Oral TID  . LORazepam  0-4 mg Intravenous Q3H  . multivitamin with minerals  1 tablet Oral Daily  . thiamine  100 mg Oral Daily   Continuous Infusions:   Active Problems:   Weakness    Time spent: 30 min    [facility] Pager [phone]. If 7PM-7AM, please contact night-coverage at [email or web address], password [facility] 12/10/2013, 9:59 AM  LOS: 2 days

## 2013-12-11 ENCOUNTER — Emergency Department (HOSPITAL_COMMUNITY)
Admission: EM | Admit: 2013-12-11 | Discharge: 2013-12-12 | Disposition: A | Discharge status: Home / Self Care | Payer: Self-pay | Attending: Emergency Medicine | Admitting: Emergency Medicine

## 2013-12-11 ENCOUNTER — Encounter (HOSPITAL_COMMUNITY): Payer: Self-pay | Admitting: Emergency Medicine

## 2013-12-11 DIAGNOSIS — Z8719 Personal history of other diseases of the digestive system: Secondary | ICD-10-CM | POA: Insufficient documentation

## 2013-12-11 DIAGNOSIS — Z880 Allergy status to penicillin: Secondary | ICD-10-CM | POA: Insufficient documentation

## 2013-12-11 DIAGNOSIS — F3289 Other specified depressive episodes: Secondary | ICD-10-CM | POA: Insufficient documentation

## 2013-12-11 DIAGNOSIS — G40909 Epilepsy, unspecified, not intractable, without status epilepticus: Secondary | ICD-10-CM | POA: Insufficient documentation

## 2013-12-11 DIAGNOSIS — Z8742 Personal history of other diseases of the female genital tract: Secondary | ICD-10-CM | POA: Insufficient documentation

## 2013-12-11 DIAGNOSIS — Z3202 Encounter for pregnancy test, result negative: Secondary | ICD-10-CM | POA: Insufficient documentation

## 2013-12-11 DIAGNOSIS — Z862 Personal history of diseases of the blood and blood-forming organs and certain disorders involving the immune mechanism: Secondary | ICD-10-CM | POA: Insufficient documentation

## 2013-12-11 DIAGNOSIS — Z79899 Other long term (current) drug therapy: Secondary | ICD-10-CM | POA: Insufficient documentation

## 2013-12-11 DIAGNOSIS — F10929 Alcohol use, unspecified with intoxication, unspecified: Secondary | ICD-10-CM

## 2013-12-11 DIAGNOSIS — G5790 Unspecified mononeuropathy of unspecified lower limb: Secondary | ICD-10-CM | POA: Insufficient documentation

## 2013-12-11 DIAGNOSIS — G629 Polyneuropathy, unspecified: Secondary | ICD-10-CM

## 2013-12-11 DIAGNOSIS — E538 Deficiency of other specified B group vitamins: Secondary | ICD-10-CM

## 2013-12-11 DIAGNOSIS — F411 Generalized anxiety disorder: Secondary | ICD-10-CM | POA: Insufficient documentation

## 2013-12-11 DIAGNOSIS — F10939 Alcohol use, unspecified with withdrawal, unspecified: Secondary | ICD-10-CM

## 2013-12-11 DIAGNOSIS — F10239 Alcohol dependence with withdrawal, unspecified: Secondary | ICD-10-CM

## 2013-12-11 DIAGNOSIS — E876 Hypokalemia: Secondary | ICD-10-CM

## 2013-12-11 DIAGNOSIS — R443 Hallucinations, unspecified: Secondary | ICD-10-CM | POA: Insufficient documentation

## 2013-12-11 DIAGNOSIS — R000 Tachycardia, unspecified: Secondary | ICD-10-CM | POA: Insufficient documentation

## 2013-12-11 DIAGNOSIS — F1010 Alcohol abuse, uncomplicated: Secondary | ICD-10-CM | POA: Insufficient documentation

## 2013-12-11 DIAGNOSIS — R1110 Vomiting, unspecified: Secondary | ICD-10-CM | POA: Insufficient documentation

## 2013-12-11 DIAGNOSIS — F329 Major depressive disorder, single episode, unspecified: Secondary | ICD-10-CM | POA: Insufficient documentation

## 2013-12-11 DIAGNOSIS — G621 Alcoholic polyneuropathy: Principal | ICD-10-CM

## 2013-12-11 DIAGNOSIS — F1020 Alcohol dependence, uncomplicated: Secondary | ICD-10-CM

## 2013-12-11 LAB — POC URINE PREG, ED: Preg Test, Ur: NEGATIVE

## 2013-12-11 MED ORDER — THIAMINE HCL 100 MG PO TABS: 100.0000 mg | ORAL_TABLET | Freq: Every day | ORAL | Status: DC

## 2013-12-11 MED ORDER — FOLIC ACID 1 MG PO TABS: 1.0000 mg | ORAL_TABLET | Freq: Every day | ORAL | Status: DC

## 2013-12-11 MED ORDER — ADULT MULTIVITAMIN W/MINERALS CH: 1.0000 | ORAL_TABLET | Freq: Every day | ORAL | Status: DC

## 2013-12-11 MED ORDER — ENSURE COMPLETE PO LIQD: 237.0000 mL | Freq: Two times a day (BID) | ORAL | Status: DC

## 2013-12-11 MED ORDER — GABAPENTIN 300 MG PO CAPS: 300.0000 mg | ORAL_CAPSULE | Freq: Three times a day (TID) | ORAL | Status: DC

## 2013-12-11 MED ORDER — ONDANSETRON HCL 4 MG PO TABS: 4.0000 mg | ORAL_TABLET | Freq: Four times a day (QID) | ORAL | Status: DC | PRN

## 2013-12-11 MED ORDER — GABAPENTIN 300 MG PO CAPS
300.0000 mg | ORAL_CAPSULE | Freq: Three times a day (TID) | ORAL | Status: DC
  Filled 2013-12-11 (×2): qty 1

## 2013-12-11 MED ORDER — PNEUMOCOCCAL VAC POLYVALENT 25 MCG/0.5ML IJ INJ: 0.5000 mL | INJECTION | INTRAMUSCULAR | Status: DC

## 2013-12-11 MED ORDER — CYANOCOBALAMIN 1000 MCG PO TABS: 1000.0000 ug | ORAL_TABLET | Freq: Every day | ORAL | Status: DC

## 2013-12-11 MED ORDER — LORAZEPAM 2 MG PO TABS: ORAL_TABLET | ORAL | Status: DC

## 2013-12-11 NOTE — Progress Notes (Signed)
Pt leaving at this time with her "friend". Alert and oriented. Discharge instructions/prescriptions given/explained with pt verbalizing understanding. Followup appointments noted.

## 2013-12-11 NOTE — Discharge Summary (Addendum)
Physician Discharge Summary  Kristen Roberson KZS:[id number] DOB: 08-07-1975 DOA: 12/08/2013  PCP: Angelica Chessman, MD  Admit date: 12/08/2013 Discharge date: 12/11/2013  Recommendations for Outpatient Follow-up:  1. Follow up with primary care doctor in 1-2 weeks for f/u alcoholism and peripheral neuropathy 2. Follow up with psychiatry or behavioral health for counseling  3. Patient advised to continue AA and has friend coming to stay with her for a few days 4. Neurosurgery within one month as needed.  Discharge Diagnoses:  Principal Problem:   Alcoholic peripheral neuropathy Active Problems:   Weakness   Malnutrition of moderate degree   Folate deficiency   Alcoholism   Alcohol withdrawal   Hypokalemia   Hypomagnesemia   Discharge Condition: stable, improved  Diet recommendation: regular  Wt Readings from Last 3 Encounters:  12/09/13 54.432 kg (120 lb)  11/30/13 54.432 kg (120 lb)  11/28/13 54.432 kg (120 lb)    History of present illness:  38 year old female with history of proctitis, dysmenorrhea, hematochezia, GI bleeding, fatty liver, alcohol abuse, hematemesis, mallory weiss tear presents to [facility] ED with several days duration of progressively worsening LE weakness and numbness associated with cramping and tingling. She explains this first started in the feet and is now progressively worse and extending towards ankles and knees bilaterally. Pt denies any specific diagnosis of neuropathy. No recent changes in medications, no traumas to the area, no other specific symptoms of chest pain or shortness of breath, no abdominal or urinary concerns. No headaches or visual changes, no other focal neurological symptoms noted, no similar events in the past.   In ED, pt is hemodynamically stable, unable to bear weight and ambulate independently. MRI ordered by ED doctor and [place] asked to admit for further evaluation.    Hospital Course:   Peripheral neuropathy due to alcoholism and  folate deficiency.  The patient presented with bilateral lower extremity weakness with pins and needles sensation in her hands and feet. This was likely secondary to a combination of hypokalemia, hypomagnesemia, alcohol abuse, and folate deficiency. She underwent MRI of the lumbar and thoracic spine which demonstrated bilateral L5 spondylolyses with grade 1 spondylolisthesis of 8 mm, small fissure at L5-S1, and severe bilateral L5 nerve root impingement in the foramina. The case was discussed with Doctor Arnoldo Morale from neurosurgery who felt that the MRI findings were chronic and recommended outpatient follow up with neurosurgery as an outpatient.  Her vitamin B12 level was 338 and she was started on daily vitamin B12 repletion. Her hemoglobin A1c was 4.4, TSH 2.77, urine drug screen-negative, alcohol level 364. Her folate level from previous admission with less than 1. She was started on folate supplements. Her electrolytes were repleted. She was started on Neurontin and her dose titrated up to 300 mg 3 times a day. She had clinical improvement.  Hypokalemia and hypomagnesemia, resolved with IV and oral supplementation. Her phosphorus level was within normal limits.  Anemia of chronic disease/pancytopenia due to alcoholism and folate deficiency. Her hemoglobin remained stable at approximately 8.1 mg/dL. Her iron studies were recently within normal limits but her folate was less than 1. Her vitamin B12 level was within normal limits but in the lower end of normal. Her TSH was within normal limits. Should continue folate and Vitamin B12 supplementation and is advised to quit alcohol. Her HIV test was negative.  No signs of bleeding.   Alcohol withdrawal, initial alcohol level of 364 at which time she appeared sober. She was started on thiamine, folate, multivitamin and  see what with Ativan. Her initial scores were as high as 14. For the last 24 hours her scores have been 7-8. Telemetry demonstrated normal sinus  rhythm with intermittent tachycardia. The patient is asking to go home today. She will be given an Ativan taper to continue at home. She has a friend who is coming to stay with her. She is planning to quit alcohol together. She was given the information for behavioral health and she is already a member of AA.  Moderate protein calorie malnutrition secondary to alcoholism. She should eat a regular diet and was given advice to use Ensure or herniation instant breakfast twice daily.  Consultants:  None Procedures:  MRI thoracic and lumbar spines Antibiotics:  none   Discharge Exam: Filed Vitals:   12/11/13 0551  BP: 148/90  Pulse: 68  Temp: 97.8 F (36.6 C)  Resp: 16   Filed Vitals:   12/10/13 2052 12/11/13 0007 12/11/13 0300 12/11/13 0551  BP: 150/96 124/89 143/88 148/90  Pulse: 95 80 110 68  Temp:  98.2 F (36.8 C)  97.8 F (36.6 C)  TempSrc:  Oral  Oral  Resp:  12  16  Height:      Weight:      SpO2:  99%  100%    General: Thin CF, No acute distress  HEENT: NCAT, MMM  Cardiovascular: RRR, nl S1, S2 no mrg, 2+ pulses, warm extremities  Respiratory: CTAB, no increased WOB  Abdomen: NABS, soft, NT/ND  MSK: Normal tone and bulk, no LEE  Neuro: CN II-XII grossly intact, strength upper extremities 5/5, hips and knee extension/flexion bilaterally 5/5, foot plantar flexion bilaterally 5/5 and 5/5 with dorsiflexion. Has sensation to light touch on hands and feet.    Discharge Instructions      Discharge Instructions   Call MD for:  difficulty breathing, headache or visual disturbances    Complete by:  As directed      Call MD for:  extreme fatigue    Complete by:  As directed      Call MD for:  hives    Complete by:  As directed      Call MD for:  persistant dizziness or light-headedness    Complete by:  As directed      Call MD for:  persistant nausea and vomiting    Complete by:  As directed      Call MD for:  severe uncontrolled pain    Complete by:  As directed       Call MD for:  temperature >100.4    Complete by:  As directed      Diet general    Complete by:  As directed      Discharge instructions    Complete by:  As directed   You were hospitalized with neuropathy or nerve pain related to malnutrition from alcoholism and from damage to your nerves from alcohol.  Please stop drinking alcohol.  Take ativan in a decreasing dose each day to treat your symptoms of alcohol withdrawal.  If you feel like you need additional doses, please talk to your primary care doctor for additional prescriptions.  Please take your vitamin supplements and use ensure or carnation instant breakfast twice a day to boost your nutrition.  For your neuropathy, continue gabapentin 3 times per day.  Once you have stopped your alcohol and you have taken your supplements for a few weeks, your neuropathy may improve and you may be able to stop this medication.  If you have seizures or signs of worsening alcohol withdrawal, please return to the hospital.     Driving Restrictions    Complete by:  As directed   No driving or operating heavy machinery while taking ativan and while undergoing alcohol detoxification     Increase activity slowly    Complete by:  As directed             Medication List         cyanocobalamin 1000 MCG tablet  Take 1 tablet (1,000 mcg total) by mouth daily.     feeding supplement (ENSURE COMPLETE) Liqd  Take 237 mLs by mouth 2 (two) times daily between meals.     folic acid 1 MG tablet  Commonly known as:  FOLVITE  Take 1 tablet (1 mg total) by mouth daily.     gabapentin 300 MG capsule  Commonly known as:  NEURONTIN  Take 1 capsule (300 mg total) by mouth 3 (three) times daily.     LORazepam 2 MG tablet  Commonly known as:  ATIVAN  1 tab two times today, 1 tab three times tomorrow, 1/2 tab three times the next day, 1/2 tab twice daily x 1 day, then 1/2 tab twice daily as needed for the next few days, then stop.     multivitamin with minerals  Tabs tablet  Take 1 tablet by mouth daily.     ondansetron 4 MG tablet  Commonly known as:  ZOFRAN  Take 1 tablet (4 mg total) by mouth every 6 (six) hours as needed for nausea.     thiamine 100 MG tablet  Take 1 tablet (100 mg total) by mouth daily.       Follow-up Information   Follow up with Jeanann Lewandowsky, MD. Schedule an appointment as soon as possible for a visit in 2 weeks.   Specialty:  Internal Medicine   Contact information:   [address] [place] [address] [phone]       Schedule an appointment as soon as possible for a visit with [facility]. (As needed)    Specialty:  Behavioral Health   Contact information:   [address] [place] [address] [phone]      Follow up with Cristi Loron, MD. (As needed if you would like to discuss your back problems)    Specialty:  Neurosurgery   Contact information:   [address] [place] [address] [phone]       The results of significant diagnostics from this hospitalization (including imaging, microbiology, ancillary and laboratory) are listed below for reference.    Significant Diagnostic Studies: Dg Chest 2 View  12/01/2013   CLINICAL DATA:  Shortness of breath.  Chest pain.  Foot edema.  EXAM: CHEST  2 VIEW  COMPARISON:  DG CHEST 2 VIEW dated 11/28/2013  FINDINGS: The heart size and mediastinal contours are within normal limits. Both lungs are clear. The visualized skeletal structures are unremarkable.  IMPRESSION: No active cardiopulmonary disease.   Electronically Signed   By: Burman Nieves M.D.   On: 12/01/2013 00:12   Dg Chest 2 View  11/28/2013   CLINICAL DATA:  Bilateral lower leg swelling  EXAM: CHEST  2 VIEW  COMPARISON:  11/25/2013  FINDINGS: The heart size and mediastinal contours are within normal limits. Both lungs are clear. The visualized skeletal structures are unremarkable.   IMPRESSION: No active cardiopulmonary disease.  Electronically Signed   By: Inez Catalina M.D.   On: 11/28/2013 21:51   Mr Thoracic Spine Wo Contrast  12/09/2013   CLINICAL DATA:  Bilateral lower extremity weakness and numbness with cramping and tingling.  EXAM: MRI THORACIC AND LUMBAR SPINE WITHOUT CONTRAST  TECHNIQUE: Multiplanar and multiecho pulse sequences of the thoracic and lumbar spine were obtained without intravenous contrast.  COMPARISON:  None.  FINDINGS: MR THORACIC SPINE FINDINGS  There is no evidence for disc degeneration, disc herniation, vertebral body abnormality or paraspinous mass. For cortical normal size and signal throughout. There is no apparent tonsillar herniation or hydromyelia. No intraspinal mass lesion is evident. Axial images through the individual disk spaces reveal no compressive abnormality.  MR LUMBAR SPINE FINDINGS  The alignment is anatomic except for 8 mm of anterolisthesis L5 on S1 related to bilateral L5 pars defects. Mild facet arthropathy is present at this level. There is no vertebral body compression fracture or worrisome osseous lesion. The conus is normal. No paravertebral mass lesion.  The individual disc spaces were examined with axial images as follows:  L1-L2:  Normal.  L2-L3:  Normal.  L3-L4:  Normal.  L4-L5:  Mild bulge.  Mild facet arthropathy.  No impingement.  L5-S1: Small annular fissure centrally without frank disc protrusion. Bilateral L5 spondylolysis with grade 1 spondylolisthesis of 8 mm. No S1 nerve root impingement in the canal. Severe bilateral foraminal narrowing compresses both L5 nerve roots. This could potentially be worse with patient standing in flexion.  IMPRESSION: MR THORACIC SPINE IMPRESSION  No thoracic disc protrusion or compression deformity. Normal cord size without intraspinal mass lesion. No evidence for transverse myelitis or significant thoracic disc protrusion.  MR LUMBAR SPINE IMPRESSION  Bilateral L5 spondylolysis with grade 1  spondylolisthesis of 8 mm. Small annular fissure at L5-S1. Severe bilateral L5 nerve root impingement in the foramina.  No conus compression or intraspinal mass lesion.   Electronically Signed   By: Rolla Flatten M.D.   On: 12/09/2013 10:17   Mr Lumbar Spine Wo Contrast  12/09/2013   CLINICAL DATA:  Bilateral lower extremity weakness and numbness with cramping and tingling.  EXAM: MRI THORACIC AND LUMBAR SPINE WITHOUT CONTRAST  TECHNIQUE: Multiplanar and multiecho pulse sequences of the thoracic and lumbar spine were obtained without intravenous contrast.  COMPARISON:  None.  FINDINGS: MR THORACIC SPINE FINDINGS  There is no evidence for disc degeneration, disc herniation, vertebral body abnormality or paraspinous mass. For cortical normal size and signal throughout. There is no apparent tonsillar herniation or hydromyelia. No intraspinal mass lesion is evident. Axial images through the individual disk spaces reveal no compressive abnormality.  MR LUMBAR SPINE FINDINGS  The alignment is anatomic except for 8 mm of anterolisthesis L5 on S1 related to bilateral L5 pars defects. Mild facet arthropathy is present at this level. There is no vertebral body compression fracture or worrisome osseous lesion. The conus is normal. No paravertebral mass lesion.  The individual disc spaces were examined with axial images as follows:  L1-L2:  Normal.  L2-L3:  Normal.  L3-L4:  Normal.  L4-L5:  Mild bulge.  Mild facet arthropathy.  No impingement.  L5-S1: Small annular fissure centrally without frank disc protrusion. Bilateral L5 spondylolysis with grade 1 spondylolisthesis of 8 mm. No S1 nerve root impingement in the canal. Severe bilateral foraminal narrowing compresses both L5 nerve roots. This could potentially be worse with patient standing in flexion.  IMPRESSION: MR THORACIC SPINE IMPRESSION  No thoracic disc protrusion or  compression deformity. Normal cord size without intraspinal mass lesion. No evidence for transverse  myelitis or significant thoracic disc protrusion.  MR LUMBAR SPINE IMPRESSION  Bilateral L5 spondylolysis with grade 1 spondylolisthesis of 8 mm. Small annular fissure at L5-S1. Severe bilateral L5 nerve root impingement in the foramina.  No conus compression or intraspinal mass lesion.   Electronically Signed   By: Rolla Flatten M.D.   On: 12/09/2013 10:17   Dg Abd Acute W/chest  11/25/2013   CLINICAL DATA:  Abdominal pain.  Nausea and vomiting.  EXAM: ACUTE ABDOMEN SERIES (ABDOMEN 2 VIEW & CHEST 1 VIEW)  COMPARISON:  DG ABD PORTABLE 2V dated 11/23/2013; CT ABD/PELVIS W CM dated 10/28/2013; CT ABD/PELVIS W CM dated 10/05/2013  FINDINGS: Bowel gas pattern unremarkable without evidence of obstruction or significant ileus. Expected stool burden in the colon. No evidence of free air on the erect image. Air-fluid levels in the ascending colon consistent liquid stool. No abnormal calcifications. Apparent scoliosis is felt to be positional.  Cardiomediastinal silhouette unremarkable. Lungs clear. Bronchovascular markings normal. Pulmonary vascularity normal. No visible pleural effusions. No pneumothorax.  IMPRESSION: No acute abdominal or pulmonary abnormality.   Electronically Signed   By: Evangeline Dakin M.D.   On: 11/25/2013 15:59   Dg Abd Portable 2v  11/23/2013   CLINICAL DATA:  Abdominal pain.  EXAM: PORTABLE ABDOMEN - 2 VIEW  COMPARISON:  10/28/2013  FINDINGS: The bowel gas pattern is negative for obstruction. There is no evidence of free air. No radio-opaque calculi or other significant radiographic abnormality is seen.  IMPRESSION: Negative.   Electronically Signed   By: Jorje Guild M.D.   On: 11/23/2013 05:36    Microbiology: No results found for this or any previous visit (from the past 240 hour(s)).   Labs: Basic Metabolic Panel:  Recent Labs Lab 12/09/13 0010 12/09/13 0545 12/09/13 0810 12/10/13 0340  NA 143 144  --  139  K 3.1* 2.9* 2.9* 3.8  CL 102 104  --  102  CO2 27 27  --  24   GLUCOSE 119* 117*  --  112*  BUN 4* 5*  --  5*  CREATININE 0.47* 0.42*  --  0.46*  CALCIUM 8.3* 7.6*  --  7.6*  MG 1.5 1.5  --   --   PHOS  --   --   --  3.7   Liver Function Tests: No results found for this basename: AST, ALT, ALKPHOS, BILITOT, PROT, ALBUMIN,  in the last 168 hours No results found for this basename: LIPASE, AMYLASE,  in the last 168 hours No results found for this basename: AMMONIA,  in the last 168 hours CBC:  Recent Labs Lab 12/09/13 0010 12/09/13 0545 12/10/13 0340  WBC 2.9* 2.6* 2.1*  NEUTROABS 1.2*  --   --   HGB 9.3* 7.9* 8.1*  HCT 27.4* 23.6* 24.8*  MCV 93.8 93.3 95.0  PLT 152 118* CONSISTENT WITH PREVIOUS RESULT   Cardiac Enzymes:  Recent Labs Lab 12/09/13 0010  CKTOTAL 59   BNP: BNP (last 3 results)  Recent Labs  11/28/13 2059  PROBNP 39.4   CBG:  Recent Labs Lab 12/09/13 2142  GLUCAP 101*    Time coordinating discharge: 45 minutes  Signed:  Janece Canterbury  [facility] 12/11/2013, 1:44 PM

## 2013-12-11 NOTE — ED Notes (Signed)
Pt does not have to urinate at this time, was given a female urinal.

## 2013-12-11 NOTE — ED Notes (Addendum)
Pt reports that she was discharged from the hospital this morning, went home and had 2 glasses of wine, and since has had increased abdominal pain and emesis. Pt a&o x4, skin warm and dry, dry heaving in triage. Pt states that she was having visual hallucinations for the first time today of a friend that died.

## 2013-12-12 LAB — URINALYSIS, ROUTINE W REFLEX MICROSCOPIC
Bilirubin Urine: NEGATIVE
GLUCOSE, UA: NEGATIVE mg/dL
Hgb urine dipstick: NEGATIVE
Ketones, ur: NEGATIVE mg/dL
LEUKOCYTES UA: NEGATIVE
Nitrite: NEGATIVE
PROTEIN: NEGATIVE mg/dL
Specific Gravity, Urine: 1.004 — ABNORMAL LOW (ref 1.005–1.030)
Urobilinogen, UA: 1 mg/dL (ref 0.0–1.0)
pH: 7.5 (ref 5.0–8.0)

## 2013-12-12 LAB — COMPREHENSIVE METABOLIC PANEL
ALT: 35 U/L (ref 0–35)
AST: 171 U/L — AB (ref 0–37)
Albumin: 3.8 g/dL (ref 3.5–5.2)
Alkaline Phosphatase: 128 U/L — ABNORMAL HIGH (ref 39–117)
BILIRUBIN TOTAL: 0.5 mg/dL (ref 0.3–1.2)
CALCIUM: 9.1 mg/dL (ref 8.4–10.5)
CO2: 25 mEq/L (ref 19–32)
Chloride: 101 mEq/L (ref 96–112)
Creatinine, Ser: 0.46 mg/dL — ABNORMAL LOW (ref 0.50–1.10)
Glucose, Bld: 105 mg/dL — ABNORMAL HIGH (ref 70–99)
Potassium: 4 mEq/L (ref 3.7–5.3)
Sodium: 142 mEq/L (ref 137–147)
Total Protein: 7.1 g/dL (ref 6.0–8.3)

## 2013-12-12 LAB — CBC
HCT: 24.8 % — ABNORMAL LOW (ref 36.0–46.0)
Hemoglobin: 8.1 g/dL — ABNORMAL LOW (ref 12.0–15.0)
MCH: 31 pg (ref 26.0–34.0)
MCHC: 32.7 g/dL (ref 30.0–36.0)
MCV: 95 fL (ref 78.0–100.0)
Platelets: 106 10*3/uL — ABNORMAL LOW (ref 150–400)
RBC: 2.61 MIL/uL — ABNORMAL LOW (ref 3.87–5.11)
RDW: 14.7 % (ref 11.5–15.5)
WBC: 2.1 10*3/uL — ABNORMAL LOW (ref 4.0–10.5)

## 2013-12-12 LAB — CBC WITH DIFFERENTIAL/PLATELET
BASOS ABS: 0 10*3/uL (ref 0.0–0.1)
BASOS PCT: 1 % (ref 0–1)
EOS PCT: 1 % (ref 0–5)
Eosinophils Absolute: 0 10*3/uL (ref 0.0–0.7)
HEMATOCRIT: 32.4 % — AB (ref 36.0–46.0)
HEMOGLOBIN: 10.4 g/dL — AB (ref 12.0–15.0)
Lymphocytes Relative: 28 % (ref 12–46)
Lymphs Abs: 0.9 10*3/uL (ref 0.7–4.0)
MCH: 31 pg (ref 26.0–34.0)
MCHC: 32.1 g/dL (ref 30.0–36.0)
MCV: 96.4 fL (ref 78.0–100.0)
MONOS PCT: 15 % — AB (ref 3–12)
Monocytes Absolute: 0.5 10*3/uL (ref 0.1–1.0)
Neutro Abs: 1.8 10*3/uL (ref 1.7–7.7)
Neutrophils Relative %: 56 % (ref 43–77)
Platelets: 158 10*3/uL (ref 150–400)
RBC: 3.36 MIL/uL — ABNORMAL LOW (ref 3.87–5.11)
RDW: 16 % — AB (ref 11.5–15.5)
WBC: 3.2 10*3/uL — ABNORMAL LOW (ref 4.0–10.5)

## 2013-12-12 LAB — ETHANOL: Alcohol, Ethyl (B): 270 mg/dL — ABNORMAL HIGH (ref 0–11)

## 2013-12-12 LAB — RAPID URINE DRUG SCREEN, HOSP PERFORMED
AMPHETAMINES: NOT DETECTED
Barbiturates: NOT DETECTED
Benzodiazepines: NOT DETECTED
Cocaine: NOT DETECTED
Opiates: NOT DETECTED
TETRAHYDROCANNABINOL: NOT DETECTED

## 2013-12-12 LAB — ACETAMINOPHEN LEVEL: Acetaminophen (Tylenol), Serum: <15 ug/mL (ref 10–30)

## 2013-12-12 LAB — LIPASE, BLOOD: Lipase: 23 U/L (ref 11–59)

## 2013-12-12 LAB — SALICYLATE LEVEL: Salicylate Lvl: <2 mg/dL — ABNORMAL LOW (ref 2.8–20.0)

## 2013-12-12 MED ORDER — HYDROMORPHONE HCL PF 1 MG/ML IJ SOLN
1.0000 mg | Freq: Once | INTRAMUSCULAR | Status: AC
  Administered 2013-12-12: 1 mg via INTRAVENOUS
  Filled 2013-12-12: qty 1

## 2013-12-12 MED ORDER — VITAMIN B-1 100 MG PO TABS
100.0000 mg | ORAL_TABLET | Freq: Every day | ORAL | Status: DC
  Administered 2013-12-12: 100 mg via ORAL
  Filled 2013-12-12: qty 1

## 2013-12-12 MED ORDER — LORAZEPAM 1 MG PO TABS: 0.0000 mg | ORAL_TABLET | Freq: Four times a day (QID) | ORAL | Status: DC

## 2013-12-12 MED ORDER — LORAZEPAM 2 MG/ML IJ SOLN
1.0000 mg | Freq: Once | INTRAMUSCULAR | Status: AC
  Administered 2013-12-12: 1 mg via INTRAVENOUS
  Filled 2013-12-12: qty 1

## 2013-12-12 MED ORDER — THIAMINE HCL 100 MG/ML IJ SOLN: 100.0000 mg | Freq: Every day | INTRAMUSCULAR | Status: DC

## 2013-12-12 MED ORDER — GABAPENTIN 300 MG PO CAPS
300.0000 mg | ORAL_CAPSULE | Freq: Once | ORAL | Status: AC
  Administered 2013-12-12: 300 mg via ORAL
  Filled 2013-12-12: qty 1

## 2013-12-12 MED ORDER — SODIUM CHLORIDE 0.9 % IV BOLUS (SEPSIS)
1000.0000 mL | Freq: Once | INTRAVENOUS | Status: AC
  Administered 2013-12-12: 1000 mL via INTRAVENOUS

## 2013-12-12 NOTE — Discharge Instructions (Signed)
Alcohol Intoxication Alcohol intoxication occurs when you drink enough alcohol that it affects your ability to function. It can be mild or very severe. Drinking a lot of alcohol in a short time is called binge drinking. This can be very harmful. Drinking alcohol can also be more dangerous if you are taking medicines or other drugs. Some of the effects caused by alcohol may include:  Loss of coordination.  Changes in mood and behavior.  Unclear thinking.  Trouble talking (slurred speech).  Throwing up (vomiting).  Confusion.  Slowed breathing.  Twitching and shaking (seizures).  Loss of consciousness. HOME CARE  Do not drive after drinking alcohol.  Drink enough water and fluids to keep your pee (urine) clear or pale yellow. Avoid caffeine.  Only take medicine as told by your doctor. GET HELP IF:  You throw up (vomit) many times.  You do not feel better after a few days.  You frequently have alcohol intoxication. Your doctor can help decide if you should see a substance use treatment counselor. GET HELP RIGHT AWAY IF:  You become shaky when you stop drinking.  You have twitching and shaking.  You throw up blood. It may look bright red or like coffee grounds.  You notice blood in your poop (bowel movements).  You become lightheaded or pass out (faint). MAKE SURE YOU:   Understand these instructions.  Will watch your condition.  Will get help right away if you are not doing well or get worse. Document Released: 12/23/2007 Document Revised: 03/08/2013 Document Reviewed: 12/09/2012 [facility] Patient Information 2014 [place].  Peripheral Neuropathy Peripheral neuropathy is a type of nerve damage. It affects nerves that carry signals between the spinal cord and other parts of the body. These are called peripheral nerves. With peripheral neuropathy, one nerve or a group of nerves may be damaged.  CAUSES  Many things can damage peripheral nerves. For some  people with peripheral neuropathy, the cause is unknown. Some causes include:  Diabetes. This is the most common cause of peripheral neuropathy.  Injury to a nerve.  Pressure or stress on a nerve that lasts a long time.  Too little vitamin B. Alcoholism can lead to this.  Infections.  Autoimmune diseases, such as multiple sclerosis and systemic lupus erythematosus.  Inherited nerve diseases.  Some medicines, such as cancer drugs.  Toxic substances, such as lead and mercury.  Too little blood flowing to the legs.  Kidney disease.  Thyroid disease. SIGNS AND SYMPTOMS  Different people have different symptoms. The symptoms you have will depend on which of your nerves is damaged. Common symptoms include:  Loss of feeling (numbness) in the feet and hands.  Tingling in the feet and hands.  Pain that burns.  Very sensitive skin.  Weakness.  Not being able to move a part of the body (paralysis).  Muscle twitching.  Clumsiness or poor coordination.  Loss of balance.  Not being able to control your bladder.  Feeling dizzy.  Sexual problems. DIAGNOSIS  Peripheral neuropathy is a symptom, not a disease. Finding the cause of peripheral neuropathy can be hard. To figure that out, your health care provider will take a medical history and do a physical exam. A neurological exam will also be done. This involves checking things affected by your brain, spinal cord, and nerves (nervous system). For example, your health care provider will check your reflexes, how you move, and what you can feel.  Other types of tests may also be ordered, such as:  Blood tests.  A test of the fluid in your spinal cord.  Imaging tests, such as CT scans or an MRI.  Electromyography (EMG). This test checks the nerves that control muscles.  Nerve conduction velocity tests. These tests check how fast messages pass through your nerves.  Nerve biopsy. A small piece of nerve is removed. It is then  checked under a microscope. TREATMENT   Medicine is often used to treat peripheral neuropathy. Medicines may include:  Pain-relieving medicines. Prescription or over-the-counter medicine may be suggested.  Antiseizure medicine. This may be used for pain.  Antidepressants. These also may help ease pain from neuropathy.  Lidocaine. This is a numbing medicine. You might wear a patch or be given a shot.  Mexiletine. This medicine is typically used to help control irregular heart rhythms.  Surgery. Surgery may be needed to relieve pressure on a nerve or to destroy a nerve that is causing pain.  Physical therapy to help movement.  Assistive devices to help movement. HOME CARE INSTRUCTIONS   Only take over-the-counter or prescription medicines as directed by your health care provider. Follow the instructions carefully for any given medicines. Do not take any other medicines without first getting approval from your health care provider.  If you have diabetes, work closely with your health care provider to keep your blood sugar under control.  If you have numbness in your feet:  Check every day for signs of injury or infection. Watch for redness, warmth, and swelling.  Wear padded socks and comfortable shoes. These help protect your feet.  Do not do things that put pressure on your damaged nerve.  Do not smoke. Smoking keeps blood from getting to damaged nerves.  Avoid or limit alcohol. Too much alcohol can cause a lack of B vitamins. These vitamins are needed for healthy nerves.  Develop a good support system. Coping with peripheral neuropathy can be stressful. Talk to a mental health specialist or join a support group if you are struggling.  Follow up with your health care provider as directed. SEEK MEDICAL CARE IF:   You have new signs or symptoms of peripheral neuropathy.  You are struggling emotionally from dealing with peripheral neuropathy.  You have a fever. SEEK  IMMEDIATE MEDICAL CARE IF:   You have an injury or infection that is not healing.  You feel very dizzy or begin vomiting.  You have chest pain.  You have trouble breathing. Document Released: 06/26/2002 Document Revised: 03/18/2011 Document Reviewed: 03/13/2013 [facility] Patient Information 2014 [place].

## 2013-12-12 NOTE — ED Provider Notes (Signed)
CSN: [id number]     Arrival date & time 12/11/13  2210 History   First MD Initiated Contact with Patient 12/11/13 2334     Chief Complaint  Patient presents with  . Abdominal Pain  . Emesis     (Consider location/radiation/quality/duration/timing/severity/associated sxs/prior Treatment) HPI Comments: Kristen Roberson is a 38 y.o. Female who was discharged from this hospital this morning for treatment of alcohol withdrawal and etoh induced peripheral neuropathy, which has worsened since being discharged this morning.  She reports she had only 2 glasses of chardonnay today and denies etoh intoxication.  She has severe pain in her lower extremities and denies abdominal pain, nausea or vomiting although reported this complaint upon first arrival in the ed tonight.  She states she had a visual hallucination today of a friend who died which she has never experienced before.  She was prescribed gabapentin and ativan along with vitamins including thiamine, vitamin B12, folate this am upon discharge which she has not yet started.     The history is provided by the patient.    Past Medical History  Diagnosis Date  . Proctitis   . Cysts of both ovaries   . Seizures   . Anemia   . Anxiety   . Blood transfusion without reported diagnosis   . Depression   . Fatty liver 10/05/13   Past Surgical History  Procedure Laterality Date  . Ovarian cyst removal    . Laparoscopy N/A 09/28/2013    Procedure: LAPAROSCOPY OPERATIVE;  Surgeon: Terrance Mass, MD;  Location: [facility];  Service: Gynecology;  Laterality: N/A;  . Laparoscopic appendectomy Right 09/28/2013    Procedure: APPENDECTOMY LAPAROSCOPIC;  Surgeon: Terrance Mass, MD;  Location: [facility];  Service: Gynecology;  Laterality: Right;  . Salpingoophorectomy Right 09/28/2013    Procedure: SALPINGO OOPHORECTOMY;  Surgeon: Terrance Mass, MD;  Location: [facility];  Service: Gynecology;  Laterality: Right;  . Colonoscopy N/A 09/30/2013    Procedure:  COLONOSCOPY;  Surgeon: Lafayette Dragon, MD;  Location: [facility];  Service: Endoscopy;  Laterality: N/A;  . Esophagogastroduodenoscopy N/A 11/23/2013    Procedure: ESOPHAGOGASTRODUODENOSCOPY (EGD);  Surgeon: Jerene Bears, MD;  Location: [facility];  Service: Endoscopy;  Laterality: N/A;   Family History  Problem Relation Age of Onset  . Diabetes Mother   . Hyperlipidemia Mother   . Stroke Mother   . Diabetes Father    History  Substance Use Topics  . Smoking status: Never Smoker   . Smokeless tobacco: Not on file  . Alcohol Use: 2.4 oz/week    4 Glasses of wine per week   OB History   Grav Para Term Preterm Abortions TAB SAB Ect Mult Living   7 3   4  4   3      Review of Systems  Constitutional: Negative for fever.  HENT: Negative for congestion and sore throat.   Eyes: Negative.   Respiratory: Negative for chest tightness and shortness of breath.   Cardiovascular: Negative for chest pain.  Gastrointestinal: Negative for nausea and abdominal pain.  Genitourinary: Negative.   Musculoskeletal: Positive for arthralgias. Negative for joint swelling and neck pain.  Skin: Negative.  Negative for rash and wound.  Neurological: Negative for dizziness, weakness, light-headedness, numbness and headaches.  Psychiatric/Behavioral: Positive for hallucinations.      Allergies  Morphine and related; Tramadol; and Penicillins  Home Medications   Prior to Admission medications   Medication Sig Start Date End Date Taking? Authorizing  Provider  feeding supplement, ENSURE COMPLETE, (ENSURE COMPLETE) LIQD Take 237 mLs by mouth 2 (two) times daily between meals. 12/11/13   Janece Canterbury, MD  folic acid (FOLVITE) 1 MG tablet Take 1 tablet (1 mg total) by mouth daily. 12/11/13   Janece Canterbury, MD  gabapentin (NEURONTIN) 300 MG capsule Take 1 capsule (300 mg total) by mouth 3 (three) times daily. 12/11/13   Janece Canterbury, MD  LORazepam (ATIVAN) 2 MG tablet 1 tab two times today, 1 tab  three times tomorrow, 1/2 tab three times the next day, 1/2 tab twice daily x 1 day, then 1/2 tab twice daily as needed for the next few days, then stop. 12/11/13   Janece Canterbury, MD  Multiple Vitamin (MULTIVITAMIN WITH MINERALS) TABS tablet Take 1 tablet by mouth daily. 12/11/13   Janece Canterbury, MD  ondansetron (ZOFRAN) 4 MG tablet Take 1 tablet (4 mg total) by mouth every 6 (six) hours as needed for nausea. 12/11/13   Janece Canterbury, MD  thiamine 100 MG tablet Take 1 tablet (100 mg total) by mouth daily. 12/11/13   Janece Canterbury, MD  vitamin B-12 1000 MCG tablet Take 1 tablet (1,000 mcg total) by mouth daily. 12/11/13   Janece Canterbury, MD   BP 107/67  Pulse 114  Temp(Src) 98.3 F (36.8 C) (Oral)  Resp 16  SpO2 98%  LMP 09/25/2013 Physical Exam  Nursing note and vitals reviewed. Constitutional: She appears well-developed and well-nourished.  Tearful,  Anxious.  HENT:  Head: Normocephalic and atraumatic.  Eyes: Conjunctivae are normal.  Neck: Normal range of motion.  Cardiovascular: Regular rhythm, normal heart sounds and intact distal pulses.  Tachycardia present.   Borderline tachy on exam.  Pulmonary/Chest: Effort normal and breath sounds normal. She has no wheezes.  Abdominal: Soft. Bowel sounds are normal. There is no tenderness.  Musculoskeletal: Normal range of motion.  Neurological: She is alert.  Skin: Skin is warm and dry.  Psychiatric: She has a normal mood and affect.    ED Course  Procedures (including critical care time) Labs Review Labs Reviewed  CBC WITH DIFFERENTIAL - Abnormal; Notable for the following:    WBC 3.2 (*)    RBC 3.36 (*)    Hemoglobin 10.4 (*)    HCT 32.4 (*)    RDW 16.0 (*)    Monocytes Relative 15 (*)    All other components within normal limits  COMPREHENSIVE METABOLIC PANEL - Abnormal; Notable for the following:    Glucose, Bld 105 (*)    BUN <3 (*)    Creatinine, Ser 0.46 (*)    AST 171 (*)    Alkaline Phosphatase 128 (*)    All  other components within normal limits  URINALYSIS, ROUTINE W REFLEX MICROSCOPIC - Abnormal; Notable for the following:    Specific Gravity, Urine 1.004 (*)    All other components within normal limits  ETHANOL - Abnormal; Notable for the following:    Alcohol, Ethyl (B) 270 (*)    All other components within normal limits  SALICYLATE LEVEL - Abnormal; Notable for the following:    Salicylate Lvl <7.0 (*)    All other components within normal limits  LIPASE, BLOOD  ACETAMINOPHEN LEVEL  URINE RAPID DRUG SCREEN (HOSP PERFORMED)  POC URINE PREG, ED    Imaging Review No results found.   EKG Interpretation None      MDM   Final diagnoses:  Alcohol intoxication  Peripheral neuropathy    Labs reviewed.  Pt is intoxicated.  She has elevated AST only c/w alcohol exposure.  Labs otherwise stable.  IV fluids ordered. Recent dc from here this am.  Consideration for psych eval vs repeat internal medicine consult in the ed given recent admission.   Pt was discussed with Dr. Kathrynn Humble who will continue to follow pt.      Evalee Jefferson, PA-C 12/12/13 0102

## 2013-12-13 ENCOUNTER — Encounter (HOSPITAL_COMMUNITY): Payer: Self-pay | Admitting: Emergency Medicine

## 2013-12-13 ENCOUNTER — Emergency Department (HOSPITAL_COMMUNITY)
Admission: EM | Admit: 2013-12-13 | Discharge: 2013-12-14 | Disposition: A | Discharge status: Home / Self Care | Payer: Federal, State, Local not specified - Other | Attending: Emergency Medicine | Admitting: Emergency Medicine

## 2013-12-13 DIAGNOSIS — T4272XA Poisoning by unspecified antiepileptic and sedative-hypnotic drugs, intentional self-harm, initial encounter: Secondary | ICD-10-CM | POA: Insufficient documentation

## 2013-12-13 DIAGNOSIS — F411 Generalized anxiety disorder: Secondary | ICD-10-CM | POA: Insufficient documentation

## 2013-12-13 DIAGNOSIS — F1020 Alcohol dependence, uncomplicated: Secondary | ICD-10-CM | POA: Insufficient documentation

## 2013-12-13 DIAGNOSIS — T1491XA Suicide attempt, initial encounter: Secondary | ICD-10-CM

## 2013-12-13 DIAGNOSIS — Z880 Allergy status to penicillin: Secondary | ICD-10-CM | POA: Insufficient documentation

## 2013-12-13 DIAGNOSIS — F3289 Other specified depressive episodes: Secondary | ICD-10-CM | POA: Insufficient documentation

## 2013-12-13 DIAGNOSIS — Z8719 Personal history of other diseases of the digestive system: Secondary | ICD-10-CM | POA: Insufficient documentation

## 2013-12-13 DIAGNOSIS — F329 Major depressive disorder, single episode, unspecified: Secondary | ICD-10-CM | POA: Insufficient documentation

## 2013-12-13 DIAGNOSIS — Z79899 Other long term (current) drug therapy: Secondary | ICD-10-CM | POA: Insufficient documentation

## 2013-12-13 DIAGNOSIS — Z862 Personal history of diseases of the blood and blood-forming organs and certain disorders involving the immune mechanism: Secondary | ICD-10-CM | POA: Insufficient documentation

## 2013-12-13 DIAGNOSIS — Z87448 Personal history of other diseases of urinary system: Secondary | ICD-10-CM | POA: Insufficient documentation

## 2013-12-13 DIAGNOSIS — F419 Anxiety disorder, unspecified: Secondary | ICD-10-CM

## 2013-12-13 DIAGNOSIS — F4321 Adjustment disorder with depressed mood: Secondary | ICD-10-CM

## 2013-12-13 DIAGNOSIS — T426X2A Poisoning by other antiepileptic and sedative-hypnotic drugs, intentional self-harm, initial encounter: Secondary | ICD-10-CM | POA: Insufficient documentation

## 2013-12-13 DIAGNOSIS — Z8669 Personal history of other diseases of the nervous system and sense organs: Secondary | ICD-10-CM | POA: Insufficient documentation

## 2013-12-13 DIAGNOSIS — T4271XA Poisoning by unspecified antiepileptic and sedative-hypnotic drugs, accidental (unintentional), initial encounter: Secondary | ICD-10-CM | POA: Insufficient documentation

## 2013-12-13 DIAGNOSIS — G621 Alcoholic polyneuropathy: Secondary | ICD-10-CM

## 2013-12-13 NOTE — ED Notes (Signed)
On assessment patient states her feet are about to blow up and she just wants the pain to go away. Patient states she took the Ambien because she wants the pain to

## 2013-12-13 NOTE — ED Notes (Signed)
Pt has belonging bag:  Black Picture frames of her kids, white shorts, pink shirt, white under wear, sandals, black phone at the nurses station

## 2013-12-13 NOTE — ED Provider Notes (Signed)
Shared service with midlevel provider. I have personally seen and examined the patient, providing direct face to face care, presenting with the chief complaint of leg pain. Physical exam findings include patient with normal vascular exam and warm feet. Non peritoneal abd exam.  Plan will be to treat as if neuropathy, as planned by medicine. I have reviewed the nursing documentation on past medical history, family history, and social history.   Varney Biles, MD 12/13/13 [id number]

## 2013-12-13 NOTE — ED Notes (Addendum)
Patient is from home. Patient reports taking 8 tabs of Ambien unsure of dose patient threw bottle away. Patient also took 7 tabs of gabapentin. Patient is upset because she missed her daughter birthday today. Patient is going threw messy divorce. Patient made phone calls and sent text messages saying it this was the end. At this time patient AXOX3. Patient husband in the process of getting IVC paperwork completed.

## 2013-12-14 ENCOUNTER — Encounter (HOSPITAL_COMMUNITY): Payer: Self-pay | Admitting: Emergency Medicine

## 2013-12-14 DIAGNOSIS — F4321 Adjustment disorder with depressed mood: Secondary | ICD-10-CM

## 2013-12-14 DIAGNOSIS — F1010 Alcohol abuse, uncomplicated: Secondary | ICD-10-CM

## 2013-12-14 DIAGNOSIS — F1994 Other psychoactive substance use, unspecified with psychoactive substance-induced mood disorder: Secondary | ICD-10-CM

## 2013-12-14 DIAGNOSIS — F1910 Other psychoactive substance abuse, uncomplicated: Secondary | ICD-10-CM

## 2013-12-14 LAB — COMPREHENSIVE METABOLIC PANEL
ALBUMIN: 3.1 g/dL — AB (ref 3.5–5.2)
ALT: 27 U/L (ref 0–35)
AST: 107 U/L — AB (ref 0–37)
Alkaline Phosphatase: 111 U/L (ref 39–117)
BUN: 5 mg/dL — ABNORMAL LOW (ref 6–23)
CALCIUM: 8 mg/dL — AB (ref 8.4–10.5)
CO2: 24 meq/L (ref 19–32)
Chloride: 109 mEq/L (ref 96–112)
Creatinine, Ser: 0.44 mg/dL — ABNORMAL LOW (ref 0.50–1.10)
GFR calc Af Amer: >90 mL/min (ref >90)
Glucose, Bld: 104 mg/dL — ABNORMAL HIGH (ref 70–99)
Potassium: 3.8 mEq/L (ref 3.7–5.3)
SODIUM: 147 meq/L (ref 137–147)
TOTAL PROTEIN: 5.7 g/dL — AB (ref 6.0–8.3)
Total Bilirubin: <0.2 mg/dL — ABNORMAL LOW (ref 0.3–1.2)

## 2013-12-14 LAB — CBC
HCT: 25.8 % — ABNORMAL LOW (ref 36.0–46.0)
Hemoglobin: 8.3 g/dL — ABNORMAL LOW (ref 12.0–15.0)
MCH: 31.3 pg (ref 26.0–34.0)
MCHC: 32.2 g/dL (ref 30.0–36.0)
MCV: 97.4 fL (ref 78.0–100.0)
Platelets: 140 10*3/uL — ABNORMAL LOW (ref 150–400)
RBC: 2.65 MIL/uL — AB (ref 3.87–5.11)
RDW: 16.2 % — ABNORMAL HIGH (ref 11.5–15.5)
WBC: 2.1 10*3/uL — ABNORMAL LOW (ref 4.0–10.5)

## 2013-12-14 LAB — RAPID URINE DRUG SCREEN, HOSP PERFORMED
Amphetamines: NOT DETECTED
BENZODIAZEPINES: NOT DETECTED
Barbiturates: NOT DETECTED
Cocaine: NOT DETECTED
OPIATES: NOT DETECTED
Tetrahydrocannabinol: NOT DETECTED

## 2013-12-14 LAB — SALICYLATE LEVEL

## 2013-12-14 LAB — ETHANOL: Alcohol, Ethyl (B): 334 mg/dL — ABNORMAL HIGH (ref 0–11)

## 2013-12-14 LAB — ACETAMINOPHEN LEVEL: Acetaminophen (Tylenol), Serum: <15 ug/mL (ref 10–30)

## 2013-12-14 LAB — POC URINE PREG, ED: Preg Test, Ur: NEGATIVE

## 2013-12-14 MED ORDER — LORAZEPAM 2 MG/ML IJ SOLN: 0.0000 mg | Freq: Four times a day (QID) | INTRAMUSCULAR | Status: DC

## 2013-12-14 MED ORDER — VITAMIN B-1 100 MG PO TABS
100.0000 mg | ORAL_TABLET | Freq: Every day | ORAL | Status: DC
Start: 2013-12-14 — End: 2013-12-14
  Administered 2013-12-14: 100 mg via ORAL
  Filled 2013-12-14: qty 1

## 2013-12-14 MED ORDER — ALUM & MAG HYDROXIDE-SIMETH 200-200-20 MG/5ML PO SUSP: 30.0000 mL | ORAL | Status: DC | PRN

## 2013-12-14 MED ORDER — LORAZEPAM 2 MG/ML IJ SOLN: 0.0000 mg | Freq: Two times a day (BID) | INTRAMUSCULAR | Status: DC

## 2013-12-14 MED ORDER — THIAMINE HCL 100 MG/ML IJ SOLN: 100.0000 mg | Freq: Every day | INTRAMUSCULAR | Status: DC

## 2013-12-14 MED ORDER — LORAZEPAM 1 MG PO TABS: 0.0000 mg | ORAL_TABLET | Freq: Four times a day (QID) | ORAL | Status: DC

## 2013-12-14 MED ORDER — LORAZEPAM 1 MG PO TABS: 0.0000 mg | ORAL_TABLET | Freq: Two times a day (BID) | ORAL | Status: DC

## 2013-12-14 MED ORDER — ONDANSETRON HCL 4 MG PO TABS: 4.0000 mg | ORAL_TABLET | Freq: Three times a day (TID) | ORAL | Status: DC | PRN
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
## 2013-12-14 NOTE — ED Provider Notes (Signed)
CSN: [id number]     Arrival date & time 12/13/13  2329 History   First MD Initiated Contact with Patient 12/13/13 2344     Chief Complaint  Patient presents with  . Drug Overdose     (Consider location/radiation/quality/duration/timing/severity/associated sxs/prior Treatment) HPI 38 year old female presents to emergency apartment via EMS from her home after reported suicide attempt.  Patient reportedly took the remainder of her bottle of Ambien, unknown quantity, but patient has reported up to 8-10, as well as additional Neurontin, reportedly 7 tablets.  Patient has been involuntary committed by her estranged spouse.  Reportedly, patient texted and called people letting them know "this is the end".  Per IVC paperwork, patient was seen at [facility] yesterday after suicide ideation.  Patient was seen in our emergency department on the 26th with abdominal pain and vomiting, recently discharged with alcohol withdrawal and peripheral neuropathy.  Patient reports to me she took additional Neurontin and Ambien in order to curb the pain in her feet.  She reports that she is unable to walk secondary to the pins and needle sensation.  When I ask her about the text and phone calls, she circles back to talking about her legs.  Patient will not say one way or the other if it was a suicide attempt.  She reports that she was very distraught after her daughter called her crying.  It is her daughter's birthday.  Due to a complicated divorce, patient does not have contact with her children.  Patient is seen very often in the emergency department with electrolyte abnormalities and abdominal pain.  Patient reported that she was going to quit drinking, however it does not appear that she has done this. Past Medical History  Diagnosis Date  . Proctitis   . Cysts of both ovaries   . Seizures   . Anemia   . Anxiety   . Blood transfusion without reported diagnosis   . Depression   . Fatty liver 10/05/13    Past Surgical History  Procedure Laterality Date  . Ovarian cyst removal    . Laparoscopy N/A 09/28/2013    Procedure: LAPAROSCOPY OPERATIVE;  Surgeon: Terrance Mass, MD;  Location: [facility];  Service: Gynecology;  Laterality: N/A;  . Laparoscopic appendectomy Right 09/28/2013    Procedure: APPENDECTOMY LAPAROSCOPIC;  Surgeon: Terrance Mass, MD;  Location: [facility];  Service: Gynecology;  Laterality: Right;  . Salpingoophorectomy Right 09/28/2013    Procedure: SALPINGO OOPHORECTOMY;  Surgeon: Terrance Mass, MD;  Location: [facility];  Service: Gynecology;  Laterality: Right;  . Colonoscopy N/A 09/30/2013    Procedure: COLONOSCOPY;  Surgeon: Lafayette Dragon, MD;  Location: [facility];  Service: Endoscopy;  Laterality: N/A;  . Esophagogastroduodenoscopy N/A 11/23/2013    Procedure: ESOPHAGOGASTRODUODENOSCOPY (EGD);  Surgeon: Jerene Bears, MD;  Location: [facility];  Service: Endoscopy;  Laterality: N/A;   Family History  Problem Relation Age of Onset  . Diabetes Mother   . Hyperlipidemia Mother   . Stroke Mother   . Diabetes Father    History  Substance Use Topics  . Smoking status: Never Smoker   . Smokeless tobacco: Not on file  . Alcohol Use: 2.4 oz/week    4 Glasses of wine per week   OB History   Grav Para Term Preterm Abortions TAB SAB Ect Mult Living   7 3   4  4   3      Review of Systems  Unable to perform ROS: Psychiatric disorder  Allergies  Morphine and related; Tramadol; and Penicillins  Home Medications   Prior to Admission medications   Medication Sig Start Date End Date Taking? Authorizing Provider  gabapentin (NEURONTIN) 300 MG capsule Take 1 capsule (300 mg total) by mouth 3 (three) times daily. 12/11/13  Yes Janece Canterbury, MD  zolpidem (AMBIEN) 5 MG tablet Take 30 mg by mouth once.   Yes Historical Provider, MD   BP 116/62  Pulse 112  Temp(Src) 98.1 F (36.7 C) (Oral)  Resp 18  SpO2 97%  LMP 09/25/2013 Physical Exam  Nursing note and  vitals reviewed. Constitutional: She is oriented to person, place, and time. She appears well-developed and well-nourished. She appears distressed.  HENT:  Head: Normocephalic and atraumatic.  Nose: Nose normal.  Mouth/Throat: Oropharynx is clear and moist.  Eyes: Conjunctivae and EOM are normal. Pupils are equal, round, and reactive to light.  Neck: Normal range of motion. Neck supple. No JVD present. No tracheal deviation present. No thyromegaly present.  Cardiovascular: Normal rate, regular rhythm, normal heart sounds and intact distal pulses.  Exam reveals no gallop and no friction rub.   No murmur heard. Pulmonary/Chest: Effort normal and breath sounds normal. No stridor. No respiratory distress. She has no wheezes. She has no rales. She exhibits no tenderness.  Abdominal: Soft. Bowel sounds are normal. She exhibits no distension and no mass. There is no tenderness. There is no rebound and no guarding.  Musculoskeletal: Normal range of motion. She exhibits no edema and no tenderness.  Lymphadenopathy:    She has no cervical adenopathy.  Neurological: She is alert and oriented to person, place, and time. She exhibits normal muscle tone. Coordination normal.  Skin: Skin is warm and dry. No rash noted. No erythema. No pallor.  Psychiatric:  Poor insight and judgment, flat affect.  Of note, patient speaks in a [place] accent sometimes, and with a [place] accent at other times.    ED Course  Procedures (including critical care time) Labs Review Labs Reviewed  CBC - Abnormal; Notable for the following:    WBC 2.1 (*)    RBC 2.65 (*)    Hemoglobin 8.3 (*)    HCT 25.8 (*)    RDW 16.2 (*)    Platelets 140 (*)    All other components within normal limits  COMPREHENSIVE METABOLIC PANEL - Abnormal; Notable for the following:    Glucose, Bld 104 (*)    BUN 5 (*)    Creatinine, Ser 0.44 (*)    Calcium 8.0 (*)    Total Protein 5.7 (*)    Albumin 3.1 (*)    AST 107 (*)    Total  Bilirubin <0.2 (*)    All other components within normal limits  ETHANOL - Abnormal; Notable for the following:    Alcohol, Ethyl (B) 334 (*)    All other components within normal limits  SALICYLATE LEVEL - Abnormal; Notable for the following:    Salicylate Lvl <3.8 (*)    All other components within normal limits  ACETAMINOPHEN LEVEL  URINE RAPID DRUG SCREEN (HOSP PERFORMED)  POC URINE PREG, ED    Imaging Review No results found.   EKG Interpretation   Date/Time:  Wednesday Dec 13 2013 23:32:59 EDT Ventricular Rate:  118 PR Interval:  136 QRS Duration: 87 QT Interval:  365 QTC Calculation: 511 R Axis:   82 Text Interpretation:  Sinus tachycardia Low voltage, precordial leads  Prolonged QT interval No significant change since last tracing Confirmed  by Sharol Given  MD, Yianna Tersigni ([id number]) on 12/14/2013 12:31:07 AM      MDM   Final diagnoses:  Suicide attempt  Alcoholism  Alcoholic peripheral neuropathy    38 year old female with probable suicide attempt or gesture, patient has been IVC'd by her husband.  I've seen the patient in the past in the emergency department, and find her very bizarre.  I am concerned that she is a threat to herself given her poor judgment and escalating behaviors.   Kalman Drape, MD 12/14/13 [phone]

## 2013-12-14 NOTE — [facility] Suicide Risk Assessment (Cosign Needed)
Suicide Risk Assessment  Discharge Assessment     Demographic Factors:  Caucasian and Female  Total Time spent with patient: 15 minutes  Psychiatric Specialty Exam:     Blood pressure 111/78, pulse 87, temperature 98.1 F (36.7 C), temperature source Oral, resp. rate 20, last menstrual period 09/25/2013, SpO2 100.00%.There is no weight on file to calculate BMI.  General Appearance: Casual  Eye Contact::  Good  Speech:  Clear and Coherent and Normal Rate  Volume:  Normal  Mood:  Depressed  Affect:  Congruent  Thought Process:  Circumstantial, Coherent and Goal Directed  Orientation:  Full (Time, Place, and Person)  Thought Content:  "I am heart broken.  I don't want to kill myself"  Suicidal Thoughts:  No  Homicidal Thoughts:  No  Memory:  Immediate;   Good Recent;   Good Remote;   Good  Judgement:  Intact  Insight:  Present  Psychomotor Activity:  Normal  Concentration:  Fair  Recall:  Good  Fund of Knowledge:Good  Language: Good  Akathisia:  No  Handed:  Right  AIMS (if indicated):     Assets:  Communication Skills Desire for Improvement Housing Social Support  Sleep:       Musculoskeletal: Strength & Muscle Tone: within normal limits Gait & Station: normal Patient leans: N/A   Mental Status Per Nursing Assessment::   On Admission:     Current Mental Status by Physician: Patient denies suicidal/homicidal ideation, psychosis, and paranoia.  Loss Factors: Custody of children  Historical Factors: NA  Risk Reduction Factors:   Sense of responsibility to family, Religious beliefs about death and Positive social support  Continued Clinical Symptoms:  Depression  Cognitive Features That Contribute To Risk:  None noted    Suicide Risk:  Minimal: No identifiable suicidal ideation.  Patients presenting with no risk factors but with morbid ruminations; may be classified as minimal risk based on the severity of the depressive symptoms  Discharge  Diagnoses:   AXIS I:  Adjustment Disorder with Depressed Mood, Alcohol Abuse and Substance Induced Mood Disorder AXIS II:  Deferred AXIS III:   Past Medical History  Diagnosis Date  . Proctitis   . Cysts of both ovaries   . Seizures   . Anemia   . Anxiety   . Blood transfusion without reported diagnosis   . Depression   . Fatty liver 10/05/13   AXIS IV:  other psychosocial or environmental problems and problems with primary support group AXIS V:  51-60 moderate symptoms  Plan Of Care/Follow-up recommendations:  Activity:  Resume ususal activity Diet:  Resume usual diet  Is patient on multiple antipsychotic therapies at discharge:  No   Has Patient had three or more failed trials of antipsychotic monotherapy by history:  No  Recommended Plan for Multiple Antipsychotic Therapies: NA  Shuvon Rankin, FNP-BC 12/14/2013, 10:58 AM

## 2013-12-14 NOTE — Discharge Instructions (Signed)
Adjustment Disorder Most changes in life can cause stress. Getting used to changes may take a few months or longer. If feelings of stress, hopelessness, or worry continue, you may have an adjustment disorder. This stress-related mental health problem may affect your feelings, thinking and how you act. It occurs in both sexes and happens at any age. SYMPTOMS  Some of the following problems may be seen and vary from person to person:  Sadness or depression.  Loss of enjoyment.  Thoughts of suicide.  Fighting.  Avoiding family and friends.  Poor school performance.  Hopelessness, sense of loss.  Trouble sleeping.  Vandalism.  Worry, weight loss or gain.  Crying spells.  Anxiety  Reckless driving.  Skipping school.  Poor work Systems analyst.  Nervousness.  Ignoring bills.  Poor attitude. DIAGNOSIS  Your caregiver will ask what has happened in your life and do a physical exam. They will make a diagnosis of an adjustment disorder when they are sure another problem or medical illness causing your feelings does not exist. TREATMENT  When problems caused by stress interfere with you daily life or last longer than a few months, you may need counseling for an adjustment disorder. Early treatment may diminish problems and help you to better cope with the stressful events in your life. Sometimes medication is necessary. Individual counseling and or support groups can be very helpful. PROGNOSIS  Adjustment disorders usually last less than 3 to 6 months. The condition may persist if there is long lasting stress. This could include health problems, relationship problems, or job difficulties where you can not easily escape from what is causing the problem. PREVENTION  Even the most mentally healthy, highly functioning people can suffer from an adjustment disorder given a significant blow from a life-changing event. There is no way to prevent pain and loss. Most people need help from time  to time. You are not alone. SEEK MEDICAL CARE IF:  Your feelings or symptoms listed above do not improve or worsen. Document Released: 03/10/2006 Document Revised: 09/28/2011 Document Reviewed: 06/01/2007 [facility] Patient Information 2014 [place], [place].  Alcohol Use Disorder Alcohol use disorder is a mental disorder. It is not a one-time incident of heavy drinking. Alcohol use disorder is the excessive and uncontrollable use of alcohol over time that leads to problems with functioning in one or more areas of daily living. People with this disorder risk harming themselves and others when they drink to excess. Alcohol use disorder also can cause other mental disorders, such as mood and anxiety disorders, and serious physical problems. People with alcohol use disorder often misuse other drugs.  Alcohol use disorder is common and widespread. Some people with this disorder drink alcohol to cope with or escape from negative life events. Others drink to relieve chronic pain or symptoms of mental illness. People with a family history of alcohol use disorder are at higher risk of losing control and using alcohol to excess.  SYMPTOMS  Signs and symptoms of alcohol use disorder may include the following:   Consumption ofalcohol inlarger amounts or over a longer period of time than intended.  Multiple unsuccessful attempts to cutdown or control alcohol use.   A great deal of time spent obtaining alcohol, using alcohol, or recovering from the effects of alcohol (hangover).  A strong desire or urge to use alcohol (cravings).   Continued use of alcohol despite problems at work, school, or home because of alcohol use.   Continued use of alcohol despite problems in relationships because of  alcohol use.  Continued use of alcohol in situations when it is physically hazardous, such as driving a car.  Continued use of alcohol despite awareness of a physical or psychological problem that is likely  related to alcohol use. Physical problems related to alcohol use can involve the brain, heart, liver, stomach, and intestines. Psychological problems related to alcohol use include intoxication, depression, anxiety, psychosis, delirium, and dementia.   The need for increased amounts of alcohol to achieve the same desired effect, or a decreased effect from the consumption of the same amount of alcohol (tolerance).  Withdrawal symptoms upon reducing or stopping alcohol use, or alcohol use to reduce or avoid withdrawal symptoms. Withdrawal symptoms include:  Racing heart.  Hand tremor.  Difficulty sleeping.  Nausea.  Vomiting.  Hallucinations.  Restlessness.  Seizures. DIAGNOSIS Alcohol use disorder is diagnosed through an assessment by your caregiver. Your caregiver may start by asking three or four questions to screen for excessive or problematic alcohol use. To confirm a diagnosis of alcohol use disorder, at least two symptoms (see SYMPTOMS) must be present within a 41-month period. The severity of alcohol use disorder depends on the number of symptoms:  Mild two or three.  Moderate four or five.  Severe six or more. Your caregiver may perform a physical exam or use results from lab tests to see if you have physical problems resulting from alcohol use. Your caregiver may refer you to a mental health professional for evaluation. TREATMENT  Some people with alcohol use disorder are able to reduce their alcohol use to low-risk levels. Some people with alcohol use disorder need to quit drinking alcohol. When necessary, mental health professionals with specialized training in substance use treatment can help. Your caregiver can help you decide how severe your alcohol use disorder is and what type of treatment you need. The following forms of treatment are available:   Detoxification. Detoxification involves the use of prescription medication to prevent alcohol withdrawal symptoms in the  first week after quitting. This is important for people with a history of symptoms of withdrawal and for heavy drinkers who are likely to have withdrawal symptoms. Alcohol withdrawal can be dangerous and, in severe cases, cause death. Detoxification is usually provided in a hospital or in-patient substance use treatment facility.  Counseling or talk therapy. Talk therapy is provided by substance use treatment counselors. It addresses the reasons people use alcohol and ways to keep them from drinking again. The goals of talk therapy are to help people with alcohol use disorder find healthy activities and ways to cope with life stress, to identify and avoid triggers for alcohol use, and to handle cravings, which can cause relapse.  Medication.Different medications can help treat alcohol use disorder through the following actions:  Decrease alcohol cravings.  Decrease the positive reward response felt from alcohol use.  Produce an uncomfortable physical reaction when alcohol is used (aversion therapy).  Support groups. Support groups are run by people who have quit drinking. They provide emotional support, advice, and guidance. These forms of treatment are often combined. Some people with alcohol use disorder benefit from intensive combination treatment provided by specialized substance use treatment centers. Both inpatient and outpatient treatment programs are available. Document Released: 08/13/2004 Document Revised: 03/08/2013 Document Reviewed: 10/13/2012 [facility] Patient Information 2014 [place].
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
## 2013-12-14 NOTE — ED Notes (Addendum)
Notified by [employer] that patients boyfriend is currently a patient at [facility] he goes by the name Scott.

## 2013-12-14 NOTE — Consult Note (Signed)
Face to face evaluation and I agree with this note 

## 2013-12-14 NOTE — ED Notes (Signed)
Lenice Pressman contact #  [phone]   Call if needed

## 2013-12-17 ENCOUNTER — Emergency Department (HOSPITAL_COMMUNITY)
Admission: EM | Admit: 2013-12-17 | Discharge: 2013-12-17 | Disposition: A | Discharge status: Home / Self Care | Payer: Self-pay | Attending: Emergency Medicine | Admitting: Emergency Medicine

## 2013-12-17 ENCOUNTER — Encounter (HOSPITAL_COMMUNITY): Payer: Self-pay | Admitting: Emergency Medicine

## 2013-12-17 ENCOUNTER — Emergency Department (HOSPITAL_COMMUNITY): Payer: Self-pay

## 2013-12-17 DIAGNOSIS — G40909 Epilepsy, unspecified, not intractable, without status epilepticus: Secondary | ICD-10-CM | POA: Insufficient documentation

## 2013-12-17 DIAGNOSIS — S3981XA Other specified injuries of abdomen, initial encounter: Secondary | ICD-10-CM | POA: Insufficient documentation

## 2013-12-17 DIAGNOSIS — R109 Unspecified abdominal pain: Secondary | ICD-10-CM | POA: Insufficient documentation

## 2013-12-17 DIAGNOSIS — Z8719 Personal history of other diseases of the digestive system: Secondary | ICD-10-CM | POA: Insufficient documentation

## 2013-12-17 DIAGNOSIS — Z3202 Encounter for pregnancy test, result negative: Secondary | ICD-10-CM | POA: Insufficient documentation

## 2013-12-17 DIAGNOSIS — Z79899 Other long term (current) drug therapy: Secondary | ICD-10-CM | POA: Insufficient documentation

## 2013-12-17 DIAGNOSIS — F411 Generalized anxiety disorder: Secondary | ICD-10-CM | POA: Insufficient documentation

## 2013-12-17 DIAGNOSIS — F3289 Other specified depressive episodes: Secondary | ICD-10-CM | POA: Insufficient documentation

## 2013-12-17 DIAGNOSIS — Z880 Allergy status to penicillin: Secondary | ICD-10-CM | POA: Insufficient documentation

## 2013-12-17 DIAGNOSIS — F329 Major depressive disorder, single episode, unspecified: Secondary | ICD-10-CM | POA: Insufficient documentation

## 2013-12-17 DIAGNOSIS — Z862 Personal history of diseases of the blood and blood-forming organs and certain disorders involving the immune mechanism: Secondary | ICD-10-CM | POA: Insufficient documentation

## 2013-12-17 DIAGNOSIS — G8929 Other chronic pain: Secondary | ICD-10-CM | POA: Insufficient documentation

## 2013-12-17 DIAGNOSIS — Z8742 Personal history of other diseases of the female genital tract: Secondary | ICD-10-CM | POA: Insufficient documentation

## 2013-12-17 DIAGNOSIS — S3991XA Unspecified injury of abdomen, initial encounter: Secondary | ICD-10-CM

## 2013-12-17 LAB — URINALYSIS, ROUTINE W REFLEX MICROSCOPIC
Bilirubin Urine: NEGATIVE
Glucose, UA: NEGATIVE mg/dL
Hgb urine dipstick: NEGATIVE
Ketones, ur: NEGATIVE mg/dL
NITRITE: NEGATIVE
PH: 5.5 (ref 5.0–8.0)
Protein, ur: NEGATIVE mg/dL
SPECIFIC GRAVITY, URINE: 1.014 (ref 1.005–1.030)
Urobilinogen, UA: 1 mg/dL (ref 0.0–1.0)

## 2013-12-17 LAB — URINE MICROSCOPIC-ADD ON

## 2013-12-17 LAB — CBC WITH DIFFERENTIAL/PLATELET
Basophils Absolute: 0.1 10*3/uL (ref 0.0–0.1)
Basophils Relative: 2 % — ABNORMAL HIGH (ref 0–1)
Eosinophils Absolute: 0 10*3/uL (ref 0.0–0.7)
Eosinophils Relative: 0 % (ref 0–5)
HCT: 34.6 % — ABNORMAL LOW (ref 36.0–46.0)
HEMOGLOBIN: 11 g/dL — AB (ref 12.0–15.0)
LYMPHS ABS: 1.4 10*3/uL (ref 0.7–4.0)
LYMPHS PCT: 39 % (ref 12–46)
MCH: 30.4 pg (ref 26.0–34.0)
MCHC: 31.8 g/dL (ref 30.0–36.0)
MCV: 95.6 fL (ref 78.0–100.0)
MONOS PCT: 15 % — AB (ref 3–12)
Monocytes Absolute: 0.5 10*3/uL (ref 0.1–1.0)
NEUTROS ABS: 1.5 10*3/uL — AB (ref 1.7–7.7)
NEUTROS PCT: 44 % (ref 43–77)
Platelets: 250 10*3/uL (ref 150–400)
RBC: 3.62 MIL/uL — AB (ref 3.87–5.11)
RDW: 15.2 % (ref 11.5–15.5)
WBC: 3.5 10*3/uL — AB (ref 4.0–10.5)

## 2013-12-17 LAB — BASIC METABOLIC PANEL
BUN: 7 mg/dL (ref 6–23)
CHLORIDE: 104 meq/L (ref 96–112)
CO2: 21 mEq/L (ref 19–32)
Calcium: 8.5 mg/dL (ref 8.4–10.5)
Creatinine, Ser: 0.45 mg/dL — ABNORMAL LOW (ref 0.50–1.10)
GFR calc Af Amer: >90 mL/min (ref >90)
GFR calc non Af Amer: >90 mL/min (ref >90)
Glucose, Bld: 107 mg/dL — ABNORMAL HIGH (ref 70–99)
POTASSIUM: 4.2 meq/L (ref 3.7–5.3)
Sodium: 142 mEq/L (ref 137–147)

## 2013-12-17 LAB — PREGNANCY, URINE: PREG TEST UR: NEGATIVE

## 2013-12-17 MED ORDER — HYDROMORPHONE HCL PF 1 MG/ML IJ SOLN
0.5000 mg | Freq: Once | INTRAMUSCULAR | Status: AC
  Administered 2013-12-17: 0.5 mg via INTRAVENOUS
  Filled 2013-12-17: qty 1

## 2013-12-17 MED ORDER — HYDROMORPHONE HCL PF 1 MG/ML IJ SOLN
1.0000 mg | Freq: Once | INTRAMUSCULAR | Status: AC
  Administered 2013-12-17: 1 mg via INTRAVENOUS
  Filled 2013-12-17: qty 1

## 2013-12-17 MED ORDER — SODIUM CHLORIDE 0.9 % IV BOLUS (SEPSIS)
1000.0000 mL | Freq: Once | INTRAVENOUS | Status: AC
  Administered 2013-12-17: 1000 mL via INTRAVENOUS

## 2013-12-17 MED ORDER — IOHEXOL 300 MG/ML  SOLN
100.0000 mL | Freq: Once | INTRAMUSCULAR | Status: AC | PRN
  Administered 2013-12-17: 100 mL via INTRAVENOUS
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
## 2013-12-17 NOTE — Discharge Instructions (Signed)
Assault, General Assault includes any behavior, whether intentional or reckless, which results in bodily injury to another person and/or damage to property. Included in this would be any behavior, intentional or reckless, that by its nature would be understood (interpreted) by a reasonable person as intent to harm another person or to damage his/her property. Threats may be oral or written. They may be communicated through regular mail, computer, fax, or phone. These threats may be direct or implied. FORMS OF ASSAULT INCLUDE:  Physically assaulting a person. This includes physical threats to inflict physical harm as well as:  Slapping.  Hitting.  Poking.  Kicking.  Punching.  Pushing.  Arson.  Sabotage.  Equipment vandalism.  Damaging or destroying property.  Throwing or hitting objects.  Displaying a weapon or an object that appears to be a weapon in a threatening manner.  Carrying a firearm of any kind.  Using a weapon to harm someone.  Using greater physical size/strength to intimidate another.  Making intimidating or threatening gestures.  Bullying.  Hazing.  Intimidating, threatening, hostile, or abusive language directed toward another person.  It communicates the intention to engage in violence against that person. And it leads a reasonable person to expect that violent behavior may occur.  Stalking another person. IF IT HAPPENS AGAIN:  Immediately call for emergency help (911 in [place]  If someone poses clear and immediate danger to you, seek legal authorities to have a protective or restraining order put in place.  Less threatening assaults can at least be reported to authorities. STEPS TO TAKE IF A SEXUAL ASSAULT HAS HAPPENED  Go to an area of safety. This may include a shelter or staying with a friend. Stay away from the area where you have been attacked. A large percentage of sexual assaults are caused by a friend, relative or associate.  If  medications were given by your caregiver, take them as directed for the full length of time prescribed.  Only take over-the-counter or prescription medicines for pain, discomfort, or fever as directed by your caregiver.  If you have come in contact with a sexual disease, find out if you are to be tested again. If your caregiver is concerned about the HIV/AIDS virus, he/she may require you to have continued testing for several months.  For the protection of your privacy, test results can not be given over the phone. Make sure you receive the results of your test. If your test results are not back during your visit, make an appointment with your caregiver to find out the results. Do not assume everything is normal if you have not heard from your caregiver or the medical facility. It is important for you to follow up on all of your test results.  File appropriate papers with authorities. This is important in all assaults, even if it has occurred in a family or by a friend. SEEK MEDICAL CARE IF:  You have new problems because of your injuries.  You have problems that may be because of the medicine you are taking, such as:  Rash.  Itching.  Swelling.  Trouble breathing.  You develop belly (abdominal) pain, feel sick to your stomach (nausea) or are vomiting.  You begin to run a temperature.  You need supportive care or referral to a rape crisis center. These are centers with trained personnel who can help you get through this ordeal. SEEK IMMEDIATE MEDICAL CARE IF:  You are afraid of being threatened, beaten, or abused. In [place] call 911.  You  need supportive care or referral to a rape crisis center. These are centers with trained personnel who can help you get through this ordeal.  SEEK IMMEDIATE MEDICAL CARE IF:  · You are afraid of being threatened, beaten, or abused. In [place] call 911.  · You receive new injuries related to abuse.  · You develop severe pain in any area injured in the assault or have any change in your condition that concerns you.  · You faint or lose consciousness.  · You develop chest pain or shortness of breath.  Document Released: 07/06/2005 Document Revised: 09/28/2011 Document Reviewed: 02/22/2008  Exit[employer]® Patient  Information ©2014 [employer].
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
## 2013-12-17 NOTE — ED Notes (Signed)
Patient refuses to urinate due to being in pain.

## 2013-12-17 NOTE — ED Notes (Signed)
Patient refuses to urinate due to being in so much pain.

## 2013-12-17 NOTE — ED Notes (Signed)
Family at bedside. 

## 2013-12-18 ENCOUNTER — Emergency Department (HOSPITAL_COMMUNITY)
Admission: EM | Admit: 2013-12-18 | Discharge: 2013-12-18 | Disposition: A | Discharge status: Home / Self Care | Payer: Self-pay | Attending: Emergency Medicine | Admitting: Emergency Medicine

## 2013-12-18 ENCOUNTER — Encounter (HOSPITAL_COMMUNITY): Payer: Self-pay | Admitting: Emergency Medicine

## 2013-12-18 DIAGNOSIS — F411 Generalized anxiety disorder: Secondary | ICD-10-CM | POA: Insufficient documentation

## 2013-12-18 DIAGNOSIS — R109 Unspecified abdominal pain: Secondary | ICD-10-CM

## 2013-12-18 DIAGNOSIS — F329 Major depressive disorder, single episode, unspecified: Secondary | ICD-10-CM | POA: Insufficient documentation

## 2013-12-18 DIAGNOSIS — Z880 Allergy status to penicillin: Secondary | ICD-10-CM | POA: Insufficient documentation

## 2013-12-18 DIAGNOSIS — Z8742 Personal history of other diseases of the female genital tract: Secondary | ICD-10-CM | POA: Insufficient documentation

## 2013-12-18 DIAGNOSIS — Z862 Personal history of diseases of the blood and blood-forming organs and certain disorders involving the immune mechanism: Secondary | ICD-10-CM | POA: Insufficient documentation

## 2013-12-18 DIAGNOSIS — F4321 Adjustment disorder with depressed mood: Secondary | ICD-10-CM

## 2013-12-18 DIAGNOSIS — G40909 Epilepsy, unspecified, not intractable, without status epilepticus: Secondary | ICD-10-CM | POA: Insufficient documentation

## 2013-12-18 DIAGNOSIS — F419 Anxiety disorder, unspecified: Secondary | ICD-10-CM

## 2013-12-18 DIAGNOSIS — F1010 Alcohol abuse, uncomplicated: Secondary | ICD-10-CM

## 2013-12-18 DIAGNOSIS — F3289 Other specified depressive episodes: Secondary | ICD-10-CM | POA: Insufficient documentation

## 2013-12-18 DIAGNOSIS — Z79899 Other long term (current) drug therapy: Secondary | ICD-10-CM | POA: Insufficient documentation

## 2013-12-18 DIAGNOSIS — Z8719 Personal history of other diseases of the digestive system: Secondary | ICD-10-CM | POA: Insufficient documentation

## 2013-12-18 MED ORDER — DIAZEPAM 5 MG PO TABS
5.0000 mg | ORAL_TABLET | Freq: Once | ORAL | Status: AC
  Administered 2013-12-18: 5 mg via ORAL
  Filled 2013-12-18: qty 1

## 2013-12-18 MED ORDER — DICYCLOMINE HCL 10 MG/ML IM SOLN
20.0000 mg | Freq: Once | INTRAMUSCULAR | Status: AC
  Administered 2013-12-18: 20 mg via INTRAMUSCULAR
  Filled 2013-12-18: qty 2

## 2013-12-18 MED ORDER — HYDROMORPHONE HCL PF 1 MG/ML IJ SOLN
1.0000 mg | Freq: Once | INTRAMUSCULAR | Status: AC
  Administered 2013-12-18: 1 mg via INTRAMUSCULAR
  Filled 2013-12-18: qty 1

## 2013-12-18 NOTE — ED Notes (Signed)
Kristen Roberson (friend) [phone]
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
## 2013-12-18 NOTE — ED Notes (Signed)
Pt states her pain has "come back with a vengence" and that she would not leave until she spoke to a doctor again. MD notified.

## 2013-12-18 NOTE — Discharge Instructions (Signed)
You need to establish a primary care provider. You need help with both alcohol abuse and you need someone that can help you with stress management/anxiety/coping skills, etc. We are happy to see you in the emergency room, but this is not a long term solution and doesn't adequately address your needs. Please see the attached resources. Return any time you feel the need.   Abdominal Pain, Adult Many things can cause abdominal pain. Usually, abdominal pain is not caused by a disease and will improve without treatment. It can often be observed and treated at home. Your health care provider will do a physical exam and possibly order blood tests and X-rays to help determine the seriousness of your pain. However, in many cases, more time must pass before a clear cause of the pain can be found. Before that point, your health care provider may not know if you need more testing or further treatment. HOME CARE INSTRUCTIONS  Monitor your abdominal pain for any changes. The following actions may help to alleviate any discomfort you are experiencing:  Only take over-the-counter or prescription medicines as directed by your health care provider.  Do not take laxatives unless directed to do so by your health care provider.  Try a clear liquid diet (broth, tea, or water) as directed by your health care provider. Slowly move to a bland diet as tolerated. SEEK MEDICAL CARE IF:  You have unexplained abdominal pain.  You have abdominal pain associated with nausea or diarrhea.  You have pain when you urinate or have a bowel movement.  You experience abdominal pain that wakes you in the night.  You have abdominal pain that is worsened or improved by eating food.  You have abdominal pain that is worsened with eating fatty foods. SEEK IMMEDIATE MEDICAL CARE IF:   Your pain does not go away within 2 hours.  You have a fever.  You keep throwing up (vomiting).  Your pain is felt only in portions of the abdomen,  such as the right side or the left lower portion of the abdomen.  You pass bloody or black tarry stools. MAKE SURE YOU:  Understand these instructions.   Will watch your condition.   Will get help right away if you are not doing well or get worse.  Document Released: 04/15/2005 Document Revised: 04/26/2013 Document Reviewed: 03/15/2013 [facility] Patient Information 2014 [place].  Adjustment Disorder Most changes in life can cause stress. Getting used to changes may take a few months or longer. If feelings of stress, hopelessness, or worry continue, you may have an adjustment disorder. This stress-related mental health problem may affect your feelings, thinking and how you act. It occurs in both sexes and happens at any age. SYMPTOMS  Some of the following problems may be seen and vary from person to person:  Sadness or depression.  Loss of enjoyment.  Thoughts of suicide.  Fighting.  Avoiding family and friends.  Poor school performance.  Hopelessness, sense of loss.  Trouble sleeping.  Vandalism.  Worry, weight loss or gain.  Crying spells.  Anxiety  Reckless driving.  Skipping school.  Poor work Systems analyst.  Nervousness.  Ignoring bills.  Poor attitude. DIAGNOSIS  Your caregiver will ask what has happened in your life and do a physical exam. They will make a diagnosis of an adjustment disorder when they are sure another problem or medical illness causing your feelings does not exist. TREATMENT  When problems caused by stress interfere with you daily life or last  longer than a few months, you may need counseling for an adjustment disorder. Early treatment may diminish problems and help you to better cope with the stressful events in your life. Sometimes medication is necessary. Individual counseling and or support groups can be very helpful. PROGNOSIS  Adjustment disorders usually last less than 3 to 6 months. The condition may persist if there is  long lasting stress. This could include health problems, relationship problems, or job difficulties where you can not easily escape from what is causing the problem. PREVENTION  Even the most mentally healthy, highly functioning people can suffer from an adjustment disorder given a significant blow from a life-changing event. There is no way to prevent pain and loss. Most people need help from time to time. You are not alone. SEEK MEDICAL CARE IF:  Your feelings or symptoms listed above do not improve or worsen. Document Released: 03/10/2006 Document Revised: 09/28/2011 Document Reviewed: 06/01/2007 [facility] Patient Information 2014 [place], [place].   Emergency Department Resource Guide 1) Find a Doctor and Pay Out of Pocket Although you won't have to find out who is covered by your insurance plan, it is a good idea to ask around and get recommendations. You will then need to call the office and see if the doctor you have chosen will accept you as a new patient and what types of options they offer for patients who are self-pay. Some doctors offer discounts or will set up payment plans for their patients who do not have insurance, but you will need to ask so you aren't surprised when you get to your appointment.  2) Contact Your Local Health Department Not all health departments have doctors that can see patients for sick visits, but many do, so it is worth a call to see if yours does. If you don't know where your local health department is, you can check in your phone book. The CDC also has a tool to help you locate your state's health department, and many state websites also have listings of all of their local health departments.  3) Find a [facility] If your illness is not likely to be very severe or complicated, you may want to try a walk in clinic. These are popping up all over the country in pharmacies, drugstores, and shopping centers. They're usually staffed by nurse practitioners or  physician assistants that have been trained to treat common illnesses and complaints. They're usually fairly quick and inexpensive. However, if you have serious medical issues or chronic medical problems, these are probably not your best option.  No Primary Care Doctor: - Call Health Connect at  [phone] - they can help you locate a primary care doctor that  accepts your insurance, provides certain services, etc. - Physician Referral Service- [phone]  Chronic Pain Problems: Organization         Address  Phone   Notes  [facility]  [phone] Patients need to be referred by their primary care doctor.   Medication Assistance: Organization         Address  Phone   Notes  [facility] Medication [facility] [address] [place], [place] [address] [phone] --Must be a resident of [facility] -- Must have NO insurance coverage whatsoever (no Medicaid/ Medicare, etc.) -- The pt. MUST have a primary care doctor that directs their care regularly and follows them in the community   MedAssist  [phone]   [address]  [phone]  Agencies that provide inexpensive medical care: Organization         Address  Phone   Notes  [place]  [phone]   [facility]    [phone]   [facility] [place], [place] [address] [phone]   [place] [address] [place] [phone]   Planned Parenthood    [phone]   [facility]    [phone]   [place] and [address], [place] Phone:  [phone], Fax:  [phone] Hours of Operation:  9 am - 6 pm, M-F.  Also accepts Medicaid/Medicare and self-pay.  [facility] for [address], [address], [place] Phone: [phone], Fax: [phone]. Hours of Operation:  8:30 am - 5:30 pm, M-F.   Also accepts Medicaid and self-pay.  [facility] [place] [address], [place] Phone: [phone]   [place], [place], [place] [phone], Ext. [phone] Mondays & Thursdays: 7-9 AM.  First 15 patients are seen on a first come, first serve basis.    [place] Providers:  Organization         Address  Phone   Notes  [facility] [address], [place] [phone] Also accepts self-pay patients.  [facility] [address], [place]  [phone]   [place], [address], [place] [phone]   [facility] [address], [place] [phone]   Lucianne Lei [address], [place]   [phone] Only accepts [place] Access [place] patients after they have their name applied to their card.   Self-Pay (no insurance) in [facility]:  Organization         Address  Phone   Notes  Sickle Cell Patients, [facility] Internal Medicine [place] [phone]   [facility] [place] [phone]   [facility] [place]  [place], [place], [place] [phone]   [facility]/Dr. Osei-Bonsu  [address] [place] or [address], [place] [phone] Phone number for both [place] and [place] locations is the same.  [facility] [address], [place] [phone]   [facility] [place] [address] [place] or [address] [phone]   [facility] [address] [place] [phone], phone; [phone], fax Sees patients 1st and 3rd Saturday of every month.  Must not qualify for public or private insurance (i.e. Medicaid, Medicare, [place] Health Choice, Veterans'  Benefits)  Household income should be no more than 200% of the poverty level The clinic cannot treat you if you are pregnant or think you are pregnant  Sexually transmitted diseases are not treated at the clinic.    Dental Care: Organization         Address  Phone  Notes  [facility] of [facility] [phone] Accepts children up to age 60 who are enrolled in [place] or [place]; pregnant women with a Medicaid card; and children who have applied for Medicaid or  [place] Health Choice, but were declined, whose parents can pay a reduced fee at time of service.  [facility] Department of [facility]  [address], [place] [phone] Accepts children up to age 51 who are enrolled in [place] or [place]; pregnant women with a Medicaid card; and children who have applied for Medicaid or [place] Health Choice, but were declined, whose parents can pay a reduced fee at time of service.  [place] Adult Dental Access PROGRAM  [place] [phone] Patients are seen by appointment only. Walk-ins are not accepted. [place] will see patients 26 years of age and older. Monday - Tuesday (8am-5pm) Most Wednesdays (8:30-5pm) $30 per visit, cash only  [facility] Adult Dental Access PROGRAM  [address], [facility] [phone] Patients are seen by appointment only. Walk-ins are not accepted. [place] will see patients 73 years of age and older. One Wednesday Evening (Monthly: Volunteer Based).  $30 per visit, cash only  [place]  [phone] for adults; Children under age 73, call Graduate Pediatric Dentistry at [phone]. Children aged 68-14, please call [phone] to request a pediatric application.  Dental services are provided in all areas of dental care including fillings, crowns and bridges, complete and partial  dentures, implants, gum treatment, root canals, and extractions. Preventive care is also provided. Treatment is provided to both adults and children. Patients are selected via a lottery and there is often a waiting list.   [facility] [place] [address] [place]  [phone] [email or web address]   [facility] [address] [place], [place] [phone], Ext. [phone] Second and Fourth Thursday of each month, opens at 6:30 AM; Clinic ends at 9 AM.  Patients are seen on a first-come first-served basis, and a limited number are seen during each clinic.   [facility]  [address] Hillard Danker [place], [place] [phone]   Eligibility Requirements You must have lived in [place], [place], or [place] counties for at least the last three months.   You cannot be eligible for state or federal sponsored [employer], including [employer], [place], or [employer].   You generally cannot be eligible for healthcare insurance through your employer.    How to apply: Eligibility screenings are held every Tuesday and Wednesday afternoon from 1:00 pm until 4:00 pm. You do not need an appointment for the interview!  [facility] [address] [place], [place]   [place]  [place] Department  [place]  [phone]    Behavioral Health Resources in the Community: Intensive Outpatient Programs Organization         Address  Phone  Notes  [place]. [address] [place], [place] [phone]   [facility] [address] [place], [place]   ADS: Alcohol & Drug Svcs [address], [place], [place]   [place] [address]  [place], [place] or [phone]   Substance Abuse Resources Organization          Address  Phone  Notes  Alcohol and Drug Services  [phone]   [facility]  [phone]   [facility] [place]  [phone]   Chinita Pester  [phone]   Residential & Outpatient Substance Abuse Program  [phone]  Psychological Services Organization         Address  Phone  Notes  [facility] [place]  [place]  [phone]   [place] [address] [place] or [phone]    Mobile Crisis Teams Organization         Address  Phone  Notes  [facility], Mobile Crisis Care Unit  [phone]   [facility]  [address] [place], [place]   [place] Levels [address] [place] [phone]    Self-Help/Support Groups Organization         Address  Phone             Notes  [place]. of [place] - variety of support groups  [place] Call for more information  Narcotics Anonymous (NA), [facility] [address], [employer] [place]  2 meetings at this location   Special educational needs teacher         Address  Phone  Notes  [facility]idential [facility] [place],    [place]  [phone]   [facility]  [address], [place] [address]32, [place], [place]   [place], [place] [phone] Admissions: 8am-3pm M-F  Incentives Substance [place] [address],    [place], [place] [phone]   [facility] [address] [place] [place], [place], [place]   [facility] [address],  [place], [place]   [facility] - Intensive Outpatient [address], [place], [place]   [facility] ([place] [address]  [place], [place] [phone] or [phone]   [facility] ([facility]S) [address] [place], [place] Accepts Medicaid  [facility] [place] [address],  [place] [phone] Substance Abuse/Addiction Treatment   [facility] Organization         Address  Phone  Notes  [facility]  [phone]   Domenic Schwab, PhD [address] Arlis [place], [place]   [phone] or [phone]   [place], [place] [phone]   [facility] [address] [place], [place] [phone] Insurance/Medicaid/sponsorship through [facility] and Families [address] [place]                                    [place], [place] [phone] [place] [address] [place], [place] [phone]    Dr. Adele Schilder  [phone]   [facility] Dept. 1) [address] [place] 2) [place] 3)  [place] [address], [place] [phone]  [phone]   [place] [phone] or [phone] (After Hours)
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
## 2013-12-19 ENCOUNTER — Encounter (HOSPITAL_COMMUNITY): Payer: Self-pay | Admitting: Emergency Medicine

## 2013-12-19 ENCOUNTER — Emergency Department (HOSPITAL_COMMUNITY)
Admission: EM | Admit: 2013-12-19 | Discharge: 2013-12-19 | Disposition: A | Discharge status: Home / Self Care | Payer: Self-pay | Attending: Emergency Medicine | Admitting: Emergency Medicine

## 2013-12-19 DIAGNOSIS — Z880 Allergy status to penicillin: Secondary | ICD-10-CM | POA: Insufficient documentation

## 2013-12-19 DIAGNOSIS — X58XXXA Exposure to other specified factors, initial encounter: Secondary | ICD-10-CM | POA: Insufficient documentation

## 2013-12-19 DIAGNOSIS — Z862 Personal history of diseases of the blood and blood-forming organs and certain disorders involving the immune mechanism: Secondary | ICD-10-CM | POA: Insufficient documentation

## 2013-12-19 DIAGNOSIS — R109 Unspecified abdominal pain: Secondary | ICD-10-CM

## 2013-12-19 DIAGNOSIS — Z8659 Personal history of other mental and behavioral disorders: Secondary | ICD-10-CM | POA: Insufficient documentation

## 2013-12-19 DIAGNOSIS — Y9389 Activity, other specified: Secondary | ICD-10-CM | POA: Insufficient documentation

## 2013-12-19 DIAGNOSIS — G40909 Epilepsy, unspecified, not intractable, without status epilepticus: Secondary | ICD-10-CM | POA: Insufficient documentation

## 2013-12-19 DIAGNOSIS — Z8719 Personal history of other diseases of the digestive system: Secondary | ICD-10-CM | POA: Insufficient documentation

## 2013-12-19 DIAGNOSIS — Y9289 Other specified places as the place of occurrence of the external cause: Secondary | ICD-10-CM | POA: Insufficient documentation

## 2013-12-19 DIAGNOSIS — R1110 Vomiting, unspecified: Secondary | ICD-10-CM | POA: Insufficient documentation

## 2013-12-19 DIAGNOSIS — S3981XA Other specified injuries of abdomen, initial encounter: Secondary | ICD-10-CM | POA: Insufficient documentation

## 2013-12-19 LAB — CBC WITH DIFFERENTIAL/PLATELET
BASOS PCT: 3 % — AB (ref 0–1)
Basophils Absolute: 0.1 10*3/uL (ref 0.0–0.1)
EOS ABS: 0 10*3/uL (ref 0.0–0.7)
EOS PCT: 0 % (ref 0–5)
HCT: 32.9 % — ABNORMAL LOW (ref 36.0–46.0)
HEMOGLOBIN: 10.5 g/dL — AB (ref 12.0–15.0)
LYMPHS PCT: 33 % (ref 12–46)
Lymphs Abs: 0.9 10*3/uL (ref 0.7–4.0)
MCH: 30.8 pg (ref 26.0–34.0)
MCHC: 31.9 g/dL (ref 30.0–36.0)
MCV: 96.5 fL (ref 78.0–100.0)
MONO ABS: 0.3 10*3/uL (ref 0.1–1.0)
Monocytes Relative: 11 % (ref 3–12)
Neutro Abs: 1.3 10*3/uL — ABNORMAL LOW (ref 1.7–7.7)
Neutrophils Relative %: 53 % (ref 43–77)
Platelets: 238 10*3/uL (ref 150–400)
RBC: 3.41 MIL/uL — AB (ref 3.87–5.11)
RDW: 15.6 % — ABNORMAL HIGH (ref 11.5–15.5)
WBC: 2.6 10*3/uL — ABNORMAL LOW (ref 4.0–10.5)

## 2013-12-19 LAB — URINALYSIS, ROUTINE W REFLEX MICROSCOPIC
BILIRUBIN URINE: NEGATIVE
GLUCOSE, UA: NEGATIVE mg/dL
HGB URINE DIPSTICK: NEGATIVE
Ketones, ur: 15 mg/dL — AB
Nitrite: NEGATIVE
PH: 5.5 (ref 5.0–8.0)
Protein, ur: NEGATIVE mg/dL
SPECIFIC GRAVITY, URINE: 1.02 (ref 1.005–1.030)
Urobilinogen, UA: 1 mg/dL (ref 0.0–1.0)

## 2013-12-19 LAB — COMPREHENSIVE METABOLIC PANEL
ALBUMIN: 4 g/dL (ref 3.5–5.2)
ALT: 59 U/L — AB (ref 0–35)
AST: 361 U/L — AB (ref 0–37)
Alkaline Phosphatase: 169 U/L — ABNORMAL HIGH (ref 39–117)
BUN: 9 mg/dL (ref 6–23)
CALCIUM: 8.6 mg/dL (ref 8.4–10.5)
CO2: 22 meq/L (ref 19–32)
CREATININE: 0.5 mg/dL (ref 0.50–1.10)
Chloride: 100 mEq/L (ref 96–112)
GFR calc Af Amer: >90 mL/min (ref >90)
GFR calc non Af Amer: >90 mL/min (ref >90)
Glucose, Bld: 104 mg/dL — ABNORMAL HIGH (ref 70–99)
Potassium: 4.3 mEq/L (ref 3.7–5.3)
Sodium: 140 mEq/L (ref 137–147)
Total Bilirubin: 0.8 mg/dL (ref 0.3–1.2)
Total Protein: 6.8 g/dL (ref 6.0–8.3)

## 2013-12-19 LAB — URINE MICROSCOPIC-ADD ON

## 2013-12-19 LAB — ETHANOL: ALCOHOL ETHYL (B): 274 mg/dL — AB (ref 0–11)

## 2013-12-19 MED ORDER — HYDROMORPHONE HCL PF 1 MG/ML IJ SOLN
1.0000 mg | Freq: Once | INTRAMUSCULAR | Status: AC
  Administered 2013-12-19: 1 mg via INTRAMUSCULAR
  Filled 2013-12-19: qty 1

## 2013-12-19 MED ORDER — HYDROCODONE-ACETAMINOPHEN 7.5-325 MG/15ML PO SOLN: 10.0000 mL | Freq: Four times a day (QID) | ORAL | Status: DC | PRN

## 2013-12-19 MED ORDER — OXYCODONE-ACETAMINOPHEN 5-325 MG PO TABS
1.0000 | ORAL_TABLET | Freq: Once | ORAL | Status: AC
  Administered 2013-12-19: 1 via ORAL
  Filled 2013-12-19: qty 1
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
## 2013-12-19 NOTE — Discharge Instructions (Signed)
Follow up with your stomach md and your family md

## 2013-12-21 ENCOUNTER — Emergency Department (HOSPITAL_COMMUNITY)
Admission: EM | Admit: 2013-12-21 | Discharge: 2013-12-21 | Disposition: A | Discharge status: Home / Self Care | Payer: Self-pay | Attending: Emergency Medicine | Admitting: Emergency Medicine

## 2013-12-21 ENCOUNTER — Encounter (HOSPITAL_COMMUNITY): Payer: Self-pay | Admitting: Emergency Medicine

## 2013-12-21 DIAGNOSIS — Z8719 Personal history of other diseases of the digestive system: Secondary | ICD-10-CM | POA: Insufficient documentation

## 2013-12-21 DIAGNOSIS — F329 Major depressive disorder, single episode, unspecified: Secondary | ICD-10-CM | POA: Insufficient documentation

## 2013-12-21 DIAGNOSIS — R197 Diarrhea, unspecified: Secondary | ICD-10-CM | POA: Insufficient documentation

## 2013-12-21 DIAGNOSIS — F3289 Other specified depressive episodes: Secondary | ICD-10-CM | POA: Insufficient documentation

## 2013-12-21 DIAGNOSIS — Z862 Personal history of diseases of the blood and blood-forming organs and certain disorders involving the immune mechanism: Secondary | ICD-10-CM | POA: Insufficient documentation

## 2013-12-21 DIAGNOSIS — F411 Generalized anxiety disorder: Secondary | ICD-10-CM | POA: Insufficient documentation

## 2013-12-21 DIAGNOSIS — Z880 Allergy status to penicillin: Secondary | ICD-10-CM | POA: Insufficient documentation

## 2013-12-21 DIAGNOSIS — Z79899 Other long term (current) drug therapy: Secondary | ICD-10-CM | POA: Insufficient documentation

## 2013-12-21 DIAGNOSIS — N949 Unspecified condition associated with female genital organs and menstrual cycle: Secondary | ICD-10-CM | POA: Insufficient documentation

## 2013-12-21 DIAGNOSIS — G8929 Other chronic pain: Secondary | ICD-10-CM | POA: Insufficient documentation

## 2013-12-21 DIAGNOSIS — N898 Other specified noninflammatory disorders of vagina: Secondary | ICD-10-CM | POA: Insufficient documentation

## 2013-12-21 DIAGNOSIS — R1110 Vomiting, unspecified: Secondary | ICD-10-CM | POA: Insufficient documentation

## 2013-12-21 DIAGNOSIS — R102 Pelvic and perineal pain: Secondary | ICD-10-CM

## 2013-12-21 DIAGNOSIS — G40909 Epilepsy, unspecified, not intractable, without status epilepticus: Secondary | ICD-10-CM | POA: Insufficient documentation

## 2013-12-21 LAB — WET PREP, GENITAL
Clue Cells Wet Prep HPF POC: NONE SEEN
Trich, Wet Prep: NONE SEEN
YEAST WET PREP: NONE SEEN

## 2013-12-21 MED ORDER — AZITHROMYCIN 250 MG PO TABS
1000.0000 mg | ORAL_TABLET | Freq: Once | ORAL | Status: AC
  Administered 2013-12-21: 1000 mg via ORAL
  Filled 2013-12-21: qty 4

## 2013-12-21 MED ORDER — OXYCODONE-ACETAMINOPHEN 5-325 MG PO TABS
1.0000 | ORAL_TABLET | Freq: Once | ORAL | Status: AC
  Administered 2013-12-21: 1 via ORAL
  Filled 2013-12-21: qty 1

## 2013-12-21 MED ORDER — CEFTRIAXONE SODIUM 250 MG IJ SOLR
250.0000 mg | Freq: Once | INTRAMUSCULAR | Status: AC
  Administered 2013-12-21: 250 mg via INTRAMUSCULAR
  Filled 2013-12-21: qty 250

## 2013-12-21 MED ORDER — KETOROLAC TROMETHAMINE 60 MG/2ML IM SOLN
60.0000 mg | Freq: Once | INTRAMUSCULAR | Status: AC
  Administered 2013-12-21: 60 mg via INTRAMUSCULAR
  Filled 2013-12-21: qty 2

## 2013-12-21 NOTE — ED Notes (Signed)
Patient is alert and oriented x3.  She is complaining of pelvic pain with Nausea and vomiting.  She is having the dry heaves but no vomiting.  that she has had A history of and today was just too much for her to take.  Currently her pain is a 10 of 10.

## 2013-12-21 NOTE — Discharge Instructions (Signed)
Pelvic Pain, Female °Female pelvic pain can be caused by many different things and start from a variety of places. Pelvic pain refers to pain that is located in the lower half of the abdomen and between your hips. The pain may occur over a short period of time (acute) or may be reoccurring (chronic). The cause of pelvic pain may be related to disorders affecting the female reproductive organs (gynecologic), but it may also be related to the bladder, kidney stones, an intestinal complication, or muscle or skeletal problems. Getting help right away for pelvic pain is important, especially if there has been severe, sharp, or a sudden onset of unusual pain. It is also important to get help right away because some types of pelvic pain can be life threatening.  °CAUSES  °Below are only some of the causes of pelvic pain. The causes of pelvic pain can be in one of several categories.  °· Gynecologic. °· Pelvic inflammatory disease. °· Sexually transmitted infection. °· Ovarian cyst or a twisted ovarian ligament (ovarian torsion). °· Uterine lining that grows outside the uterus (endometriosis). °· Fibroids, cysts, or tumors. °· Ovulation. °· Pregnancy. °· Pregnancy that occurs outside the uterus (ectopic pregnancy). °· Miscarriage. °· Labor. °· Abruption of the placenta or ruptured uterus. °· Infection. °· Uterine infection (endometritis). °· Bladder infection. °· Diverticulitis. °· Miscarriage related to a uterine infection (septic abortion). °· Bladder. °· Inflammation of the bladder (cystitis). °· Kidney stone(s). °· Gastrointenstinal. °· Constipation. °· Diverticulitis. °· Neurologic. °· Trauma. °· Feeling pelvic pain because of mental or emotional causes (psychosomatic). °· Cancers of the bowel or pelvis. °EVALUATION  °Your caregiver will want to take a careful history of your concerns. This includes recent changes in your health, a careful gynecologic history of your periods (menses), and a sexual history. Obtaining  your family history and medical history is also important. Your caregiver may suggest a pelvic exam. A pelvic exam will help identify the location and severity of the pain. It also helps in the evaluation of which organ system may be involved. In order to identify the cause of the pelvic pain and be properly treated, your caregiver may order tests. These tests may include:  °· A pregnancy test. °· Pelvic ultrasonography. °· An X-ray exam of the abdomen. °· A urinalysis or evaluation of vaginal discharge. °· Blood tests. °HOME CARE INSTRUCTIONS  °· Only take over-the-counter or prescription medicines for pain, discomfort, or fever as directed by your caregiver.   °· Rest as directed by your caregiver.   °· Eat a balanced diet.   °· Drink enough fluids to make your urine clear or pale yellow, or as directed.   °· Avoid sexual intercourse if it causes pain.   °· Apply warm or cold compresses to the lower abdomen depending on which one helps the pain.   °· Avoid stressful situations.   °· Keep a journal of your pelvic pain. Write down when it started, where the pain is located, and if there are things that seem to be associated with the pain, such as food or your menstrual cycle. °· Follow up with your caregiver as directed.   °SEEK MEDICAL CARE IF: °· Your medicine does not help your pain. °· You have abnormal vaginal discharge. °SEEK IMMEDIATE MEDICAL CARE IF:  °· You have heavy bleeding from the vagina.   °· Your pelvic pain increases.   °· You feel lightheaded or faint.   °· You have chills.   °· You have pain with urination or blood in your urine.   °· You have uncontrolled   diarrhea or vomiting.   °· You have a fever or persistent symptoms for more than 3 days. °· You have a fever and your symptoms suddenly get worse.   °· You are being physically or sexually abused.   °MAKE SURE YOU: °· Understand these instructions. °· Will watch your condition. °· Will get help if you are not doing well or get worse. °Document  Released: 06/02/2004 Document Revised: 01/05/2012 Document Reviewed: 10/26/2011 °[employer]® Patient Information ©2014 [employer]. ° °

## 2013-12-21 NOTE — ED Provider Notes (Signed)
CSN: [id number]     Arrival date & time 12/21/13  1943 History   First MD Initiated Contact with Patient 12/21/13 2007     Chief Complaint  Patient presents with  . Pelvic Pain     (Consider location/radiation/quality/duration/timing/severity/associated sxs/prior Treatment) Patient is a 38 y.o. female presenting with abdominal pain. The history is provided by the patient. No language interpreter was used.  Abdominal Pain Pain location:  Suprapubic Pain quality: cramping and sharp   Pain severity:  Severe Associated symptoms: diarrhea and vomiting   Associated symptoms: no chills, no dysuria, no fever and no vaginal discharge   Associated symptoms comment:  She returns to the ED for recurrent/persistent pelvic pain. She denies vaginal discharge, dysuria, fever. She reports having diarrhea (3-4 episodes daily), vomiting (3-4 episodes daily) for the past 1-2 days. No fever.    Past Medical History  Diagnosis Date  . Proctitis   . Cysts of both ovaries   . Seizures   . Anemia   . Anxiety   . Blood transfusion without reported diagnosis   . Depression   . Fatty liver 10/05/13   Past Surgical History  Procedure Laterality Date  . Ovarian cyst removal    . Laparoscopy N/A 09/28/2013    Procedure: LAPAROSCOPY OPERATIVE;  Surgeon: Terrance Mass, MD;  Location: [facility];  Service: Gynecology;  Laterality: N/A;  . Laparoscopic appendectomy Right 09/28/2013    Procedure: APPENDECTOMY LAPAROSCOPIC;  Surgeon: Terrance Mass, MD;  Location: [facility];  Service: Gynecology;  Laterality: Right;  . Salpingoophorectomy Right 09/28/2013    Procedure: SALPINGO OOPHORECTOMY;  Surgeon: Terrance Mass, MD;  Location: [facility];  Service: Gynecology;  Laterality: Right;  . Colonoscopy N/A 09/30/2013    Procedure: COLONOSCOPY;  Surgeon: Lafayette Dragon, MD;  Location: [facility];  Service: Endoscopy;  Laterality: N/A;  . Esophagogastroduodenoscopy N/A 11/23/2013    Procedure: ESOPHAGOGASTRODUODENOSCOPY  (EGD);  Surgeon: Jerene Bears, MD;  Location: [facility];  Service: Endoscopy;  Laterality: N/A;   Family History  Problem Relation Age of Onset  . Diabetes Mother   . Hyperlipidemia Mother   . Stroke Mother   . Diabetes Father    History  Substance Use Topics  . Smoking status: Never Smoker   . Smokeless tobacco: Not on file  . Alcohol Use: 2.4 oz/week    4 Glasses of wine per week     Comment: Drinks a bottle of wine daily    OB History   Grav Para Term Preterm Abortions TAB SAB Ect Mult Living   7 3   4  4   3      Review of Systems  Constitutional: Negative for fever and chills.  Gastrointestinal: Positive for vomiting, abdominal pain and diarrhea.  Genitourinary: Positive for pelvic pain. Negative for dysuria and vaginal discharge.  Musculoskeletal: Negative.  Negative for myalgias.  Neurological: Negative.       Allergies  Morphine and related; Tramadol; and Penicillins  Home Medications   Prior to Admission medications   Medication Sig Start Date End Date Taking? Authorizing Provider  acetaminophen (TYLENOL) 325 MG tablet Take 650 mg by mouth every 6 (six) hours as needed (pain).   Yes Historical Provider, MD  gabapentin (NEURONTIN) 300 MG capsule Take 1 capsule (300 mg total) by mouth 3 (three) times daily. 12/11/13  Yes Janece Canterbury, MD  HYDROcodone-acetaminophen (HYCET) 7.5-325 mg/15 ml solution Take 10 mLs by mouth every 6 (six) hours as needed for moderate pain. 12/19/13  12/19/14 Yes Maudry Diego, MD  ibuprofen (ADVIL,MOTRIN) 200 MG tablet Take 200 mg by mouth every 6 (six) hours as needed for moderate pain.    Yes Historical Provider, MD   BP 123/81  Pulse 100  Temp(Src) 98.4 F (36.9 C) (Oral)  Resp 18  SpO2 100%  LMP 09/25/2013 Physical Exam  Constitutional: She is oriented to person, place, and time. She appears well-developed and well-nourished.  HENT:  Head: Normocephalic.  Neck: Normal range of motion. Neck supple.  Cardiovascular: Normal  rate and regular rhythm.   Pulmonary/Chest: Effort normal and breath sounds normal.  Abdominal: Soft. Bowel sounds are normal. There is no tenderness. There is no rebound and no guarding.  Genitourinary:  Thin, greenish vaginal discharge present. She has tenderness throughout the pelvis. No palpable mass.   Musculoskeletal: Normal range of motion.  Neurological: She is alert and oriented to person, place, and time.  Skin: Skin is warm and dry.    ED Course  Procedures (including critical care time) Labs Review Labs Reviewed  WET PREP, GENITAL  GC/CHLAMYDIA PROBE AMP    Imaging Review No results found.   EKG Interpretation None      MDM   Final diagnoses:  None    1. Leukorrhea 2. Chronic pain  Records reviewed. She has had multiple visits for abdominal and pelvic pain in the last 3 months. She has had 3 CT scans, all normal. The last CT was 4 days ago and specifically shows no pelvic abnormalities. Discussed that no narcotics would be prescribed based on her history of multiple and repeated narcotic pain prescriptions on the [place] Controlled Substance database, as well as her history of alcohol abuse and recent overdose. Stable for discharge.     Dewaine Oats, PA-C 12/22/13 0100

## 2013-12-21 NOTE — ED Notes (Signed)
Patient is alert and oriented x3.  She was given DC instructions and follow up visit instructions.  Patient gave verbal understanding. She was DC ambulatory under her own power to home.  V/S stable.  He was not showing any signs of distress on DC 

## 2013-12-22 ENCOUNTER — Encounter (HOSPITAL_COMMUNITY): Payer: Self-pay | Admitting: *Deleted

## 2013-12-22 ENCOUNTER — Inpatient Hospital Stay (HOSPITAL_COMMUNITY)
Admission: AD | Admit: 2013-12-22 | Discharge: 2013-12-22 | Disposition: A | Discharge status: Home / Self Care | Payer: Self-pay | Source: Ambulatory Visit | Attending: Gynecology | Admitting: Gynecology

## 2013-12-22 ENCOUNTER — Inpatient Hospital Stay (HOSPITAL_COMMUNITY): Payer: Self-pay

## 2013-12-22 DIAGNOSIS — F3289 Other specified depressive episodes: Secondary | ICD-10-CM | POA: Insufficient documentation

## 2013-12-22 DIAGNOSIS — F1020 Alcohol dependence, uncomplicated: Secondary | ICD-10-CM

## 2013-12-22 DIAGNOSIS — F329 Major depressive disorder, single episode, unspecified: Secondary | ICD-10-CM | POA: Insufficient documentation

## 2013-12-22 DIAGNOSIS — N898 Other specified noninflammatory disorders of vagina: Secondary | ICD-10-CM | POA: Insufficient documentation

## 2013-12-22 DIAGNOSIS — F411 Generalized anxiety disorder: Secondary | ICD-10-CM | POA: Insufficient documentation

## 2013-12-22 DIAGNOSIS — R1032 Left lower quadrant pain: Secondary | ICD-10-CM | POA: Insufficient documentation

## 2013-12-22 DIAGNOSIS — K7689 Other specified diseases of liver: Secondary | ICD-10-CM | POA: Insufficient documentation

## 2013-12-22 LAB — COMPREHENSIVE METABOLIC PANEL
ALBUMIN: 3.8 g/dL (ref 3.5–5.2)
ALK PHOS: 144 U/L — AB (ref 39–117)
ALT: 44 U/L — ABNORMAL HIGH (ref 0–35)
AST: 154 U/L — AB (ref 0–37)
BILIRUBIN TOTAL: 0.4 mg/dL (ref 0.3–1.2)
BUN: 5 mg/dL — ABNORMAL LOW (ref 6–23)
CO2: 26 mEq/L (ref 19–32)
Calcium: 8.7 mg/dL (ref 8.4–10.5)
Chloride: 103 mEq/L (ref 96–112)
Creatinine, Ser: 0.44 mg/dL — ABNORMAL LOW (ref 0.50–1.10)
GFR calc Af Amer: >90 mL/min (ref >90)
GFR calc non Af Amer: >90 mL/min (ref >90)
Glucose, Bld: 111 mg/dL — ABNORMAL HIGH (ref 70–99)
POTASSIUM: 3.8 meq/L (ref 3.7–5.3)
Sodium: 143 mEq/L (ref 137–147)
Total Protein: 6.8 g/dL (ref 6.0–8.3)

## 2013-12-22 LAB — URINALYSIS, ROUTINE W REFLEX MICROSCOPIC
BILIRUBIN URINE: NEGATIVE
Glucose, UA: NEGATIVE mg/dL
Hgb urine dipstick: NEGATIVE
KETONES UR: NEGATIVE mg/dL
Leukocytes, UA: NEGATIVE
Nitrite: NEGATIVE
PH: 8 (ref 5.0–8.0)
Protein, ur: NEGATIVE mg/dL
Specific Gravity, Urine: 1.01 (ref 1.005–1.030)
Urobilinogen, UA: 0.2 mg/dL (ref 0.0–1.0)

## 2013-12-22 LAB — RAPID URINE DRUG SCREEN, HOSP PERFORMED
AMPHETAMINES: NOT DETECTED
BARBITURATES: NOT DETECTED
Benzodiazepines: NOT DETECTED
COCAINE: NOT DETECTED
Opiates: NOT DETECTED
Tetrahydrocannabinol: NOT DETECTED

## 2013-12-22 LAB — CBC
HCT: 29.6 % — ABNORMAL LOW (ref 36.0–46.0)
Hemoglobin: 9.6 g/dL — ABNORMAL LOW (ref 12.0–15.0)
MCH: 31.2 pg (ref 26.0–34.0)
MCHC: 32.4 g/dL (ref 30.0–36.0)
MCV: 96.1 fL (ref 78.0–100.0)
PLATELETS: 215 10*3/uL (ref 150–400)
RBC: 3.08 MIL/uL — AB (ref 3.87–5.11)
RDW: 15.6 % — ABNORMAL HIGH (ref 11.5–15.5)
WBC: 2.8 10*3/uL — ABNORMAL LOW (ref 4.0–10.5)

## 2013-12-22 LAB — TROPONIN I: Troponin I: <0.3 ng/mL (ref <0.30)

## 2013-12-22 LAB — ETHANOL: ALCOHOL ETHYL (B): 378 mg/dL — AB (ref 0–11)

## 2013-12-22 LAB — HCG, QUANTITATIVE, PREGNANCY: hCG, Beta Chain, Quant, S: <1 m[IU]/mL (ref <5)

## 2013-12-22 LAB — POCT PREGNANCY, URINE: PREG TEST UR: NEGATIVE

## 2013-12-22 MED ORDER — KETOROLAC TROMETHAMINE 60 MG/2ML IM SOLN
60.0000 mg | Freq: Once | INTRAMUSCULAR | Status: AC
  Administered 2013-12-22: 60 mg via INTRAMUSCULAR
  Filled 2013-12-22: qty 2

## 2013-12-22 MED ORDER — HYDROMORPHONE HCL PF 1 MG/ML IJ SOLN
1.0000 mg | Freq: Once | INTRAMUSCULAR | Status: AC
  Administered 2013-12-22: 1 mg via INTRAVENOUS
  Filled 2013-12-22: qty 1

## 2013-12-22 MED ORDER — LORAZEPAM 2 MG/ML IJ SOLN
1.0000 mg | Freq: Once | INTRAMUSCULAR | Status: AC
  Administered 2013-12-22: 1 mg via INTRAVENOUS
  Filled 2013-12-22: qty 1

## 2013-12-22 MED ORDER — HYDROMORPHONE HCL PF 1 MG/ML IJ SOLN
INTRAMUSCULAR | Status: AC
  Filled 2013-12-22: qty 1

## 2013-12-22 MED ORDER — PROMETHAZINE HCL 25 MG/ML IJ SOLN
25.0000 mg | Freq: Once | INTRAMUSCULAR | Status: AC
  Administered 2013-12-22: 25 mg via INTRAVENOUS
  Filled 2013-12-22: qty 1

## 2013-12-22 MED ORDER — DEXTROSE 5 % IN LACTATED RINGERS IV BOLUS
1000.0000 mL | Freq: Once | INTRAVENOUS | Status: AC
  Administered 2013-12-22: 1000 mL via INTRAVENOUS

## 2013-12-22 MED ORDER — ASPIRIN 81 MG PO CHEW
324.0000 mg | CHEWABLE_TABLET | Freq: Once | ORAL | Status: AC
  Administered 2013-12-22: 324 mg via ORAL
  Filled 2013-12-22: qty 4

## 2013-12-22 MED ORDER — PHENAZOPYRIDINE HCL 100 MG PO TABS
200.0000 mg | ORAL_TABLET | Freq: Once | ORAL | Status: AC
  Administered 2013-12-22: 200 mg via ORAL
  Filled 2013-12-22: qty 2

## 2013-12-22 NOTE — Discharge Instructions (Signed)
Abdominal Pain, Women °Abdominal (stomach, pelvic, or belly) pain can be caused by many things. It is important to tell your doctor: °· The location of the pain. °· Does it come and go or is it present all the time? °· Are there things that start the pain (eating certain foods, exercise)? °· Are there other symptoms associated with the pain (fever, nausea, vomiting, diarrhea)? °All of this is helpful to know when trying to find the cause of the pain. °CAUSES  °· Stomach: virus or bacteria infection, or ulcer. °· Intestine: appendicitis (inflamed appendix), regional ileitis (Crohn's disease), ulcerative colitis (inflamed colon), irritable bowel syndrome, diverticulitis (inflamed diverticulum of the colon), or cancer of the stomach or intestine. °· Gallbladder disease or stones in the gallbladder. °· Kidney disease, kidney stones, or infection. °· Pancreas infection or cancer. °· Fibromyalgia (pain disorder). °· Diseases of the female organs: °· Uterus: fibroid (non-cancerous) tumors or infection. °· Fallopian tubes: infection or tubal pregnancy. °· Ovary: cysts or tumors. °· Pelvic adhesions (scar tissue). °· Endometriosis (uterus lining tissue growing in the pelvis and on the pelvic organs). °· Pelvic congestion syndrome (female organs filling up with blood just before the menstrual period). °· Pain with the menstrual period. °· Pain with ovulation (producing an egg). °· Pain with an IUD (intrauterine device, birth control) in the uterus. °· Cancer of the female organs. °· Functional pain (pain not caused by a disease, may improve without treatment). °· Psychological pain. °· Depression. °DIAGNOSIS  °Your doctor will decide the seriousness of your pain by doing an examination. °· Blood tests. °· X-rays. °· Ultrasound. °· CT scan (computed tomography, special type of X-ray). °· MRI (magnetic resonance imaging). °· Cultures, for infection. °· Barium enema (dye inserted in the large intestine, to better view it with  X-rays). °· Colonoscopy (looking in intestine with a lighted tube). °· Laparoscopy (minor surgery, looking in abdomen with a lighted tube). °· Major abdominal exploratory surgery (looking in abdomen with a large incision). °TREATMENT  °The treatment will depend on the cause of the pain.  °· Many cases can be observed and treated at home. °· Over-the-counter medicines recommended by your caregiver. °· Prescription medicine. °· Antibiotics, for infection. °· Birth control pills, for painful periods or for ovulation pain. °· Hormone treatment, for endometriosis. °· Nerve blocking injections. °· Physical therapy. °· Antidepressants. °· Counseling with a psychologist or psychiatrist. °· Minor or major surgery. °HOME CARE INSTRUCTIONS  °· Do not take laxatives, unless directed by your caregiver. °· Take over-the-counter pain medicine only if ordered by your caregiver. Do not take aspirin because it can cause an upset stomach or bleeding. °· Try a clear liquid diet (broth or water) as ordered by your caregiver. Slowly move to a bland diet, as tolerated, if the pain is related to the stomach or intestine. °· Have a thermometer and take your temperature several times a day, and record it. °· Bed rest and sleep, if it helps the pain. °· Avoid sexual intercourse, if it causes pain. °· Avoid stressful situations. °· Keep your follow-up appointments and tests, as your caregiver orders. °· If the pain does not go away with medicine or surgery, you may try: °· Acupuncture. °· Relaxation exercises (yoga, meditation). °· Group therapy. °· Counseling. °SEEK MEDICAL CARE IF:  °· You notice certain foods cause stomach pain. °· Your home care treatment is not helping your pain. °· You need stronger pain medicine. °· You want your IUD removed. °· You feel faint or   lightheaded.  You develop nausea and vomiting.  You develop a rash.  You are having side effects or an allergy to your medicine. SEEK IMMEDIATE MEDICAL CARE IF:   Your  pain does not go away or gets worse.  You have a fever.  Your pain is felt only in portions of the abdomen. The right side could possibly be appendicitis. The left lower portion of the abdomen could be colitis or diverticulitis.  You are passing blood in your stools (bright red or black tarry stools, with or without vomiting).  You have blood in your urine.  You develop chills, with or without a fever.  You pass out. MAKE SURE YOU:   Understand these instructions.  Will watch your condition.  Will get help right away if you are not doing well or get worse. Document Released: 05/03/2007 Document Revised: 09/28/2011 Document Reviewed: 05/23/2009 [facility] Patient Information 2014 [place], [place].  [place] (Revised August 2014)   Chronic Pain Problems:    [facility]:  [phone]           Patients need to be referred by their primary care doctor/specialist  Insufficient Money for Medicine:           United Way: call "211"     MAP Program at [place] or [place] [phone]            No Primary Care Doctor:  To locate a primary care doctor that accepts your insurance or provides certain services:           [place]: [phone]           Physician Referral Service: [phone] ask for My [place]   If no insurance, you need to see if you qualify for [facility] orange card, call to set      up appointment for eligibility/enrollment at [phone] or [phone] or visit [place] ([address], [place] and [place]) to meet with a [facility] enrollment specialist.  Agencies that provide inexpensive (sliding fee scale) medical care:        [facility] - Family Medicine at [place] - [phone]      [facility] and [place] - [facility] -  [place] - Outlook for Children - [place] [phone]   [facility] - [place] @ [place] [phone]   [facility] - [place] @ [place] - [phone]   [facility]: [phone]    Women's Clinic: [phone]    Planned Parenthood: [phone]    [facility] of the [place]    [place] Providers:           [facility] [phone] (No Family Planning accepted)          [address], [phone], Mon-Fri 9am-5pm          [facility] - [phone]   [place], [address] [place], Mon-Thursday 8am-5pm, Fri 8am-noon   [employer] [place] [phone]  [place], [address], Mon-Fri 7:30am-4:30pm          [place], [place] [phone]          [place]. [address]          Only accepts [employer] patients after they have their name applied to their card  Self Pay (no insurance) in [facility]:           Sickle Cell Patients:    [place], [phone] [facility]:   [place] [phone]       [facility] and Wellness   [place] [phone]  [facility] Family Practice:   [address], [phone]          [facility] Urgent Care           [place], [phone] [facility] for [place], [phone]           [facility] [place]           [place] [address], [place]        [facility] - [address]            [phone], Mon-Fri 9am-7pm, [place] 9am-1pm          [facility] - Family Medicine @ [facility]          [place], [place]          [facility] - [facility]           [address] [place] [facility] and [place]   [address], [place] [phone]          [place], [place]  [facility] - [place]    [place], [phone] [facility] - [facility]   [address], [phone]  Dr. [place] Lawman           [address], [place], [facility]           [address], [phone]          [facility]             [address], [phone]          [facility]           [place], [place], 1st & 3rd Saturday every month, 10am-1pm  OTHERS:  [place]  ([facility] Only)  [phone] (Thursday only)  Strategies for finding a Primary Care Provider:  1) Find a Doctor and Pay Out of Pocket  Although you won't have to find out who is covered by your insurance plan, it is a good idea to ask around and get recommendations. You will then need to call the  office and see if the doctor you have chosen will accept you as a new patient and what types of options they offer for patients who are self-pay. Some doctors offer discounts or will set up payment plans for their patients who do not have insurance, but you will need to ask so you aren't surprised when you get to your appointment.  2) [place] - To see if you qualify for orange card access to healthcare safety net providers.  Call for appointment for eligibility/enrollment at [phone] or [phone]. (Uninsured, 0-200% FPL, qualifying info)   Applicants for [facility] are first required to see if they are eligible to enroll in the [facility] Marketplace before enrolling in [facility] (and get an exemption if they are not).  [place] Criteria for acceptance is:    Proof of Firefighter exemption - form or documentation    Valid photo ID (driver's license, state identification card, passport, home country ID)    Proof of [employer] residency (e.g. drivers license, lease/landlord information, pay stubs with address, utility bill, bank statement, etc.)    Proof of income (1040, last year's tax return, W2, 4 current pay stubs, other income proof)    Proof of assets (current bank statement + 3 most recent, disability paperwork, life insurance info, tax value on autos, etc.)  3) [place] Department  Not all health departments have doctors that can see patients for sick visits, but many do, so it is worth a call to see if yours does. If you don't know where your local health department is, you can check in your phone book. The CDC also has a tool to help you locate your state's health department, and many state websites also have listings of all of their local health departments.  4) Find a [facility]  If your illness is not likely to be very severe or complicated, you may want to try a walk in clinic. These are popping up all over the country in pharmacies, drugstores, and shopping centers. They're usually staffed by nurse practitioners or physician assistants that have been trained to treat common illnesses and complaints. They're usually fairly quick and inexpensive. However, if you have serious medical issues or chronic medical problems, these are probably not your best option   STD Testing:           [place], [place] [facility]           [address] [place], phone [phone] or [phone]           Monday - Friday, call for an appointment          [place], [place] [facility]           [address], [place], phone [phone] or [phone]           Monday - Friday, call for an appointment Abuse/Neglect:           [place]: [place]: [phone] (After Hours)  Emergency Shelter:  [facility] [phone]  [place]- [phone]  [place] - [phone]  [place] - [phone] (ages 58-17)  [place] @ [employer] - [phone]   [place] at [facility] [phone]  Maternity Homes:           [facility] [place]: [phone]   (Homeless mother with children)          [place]: [phone] (Mothers only)   [facility]: [phone] (Pregnant 2-19 years old)   Adopt a Mom [phone]  [place]    [facility] Adult and [place], [place] [phone]          [facility], [place], [place]          [facility] Dept.           [place], [place]          [facility]           [phone]          [facility] in [place]           [phone], [place]           [phone]           [phone] (After Hours)  [place] Abuse Resources:           Alcohol and Drug Services: [phone]           [facility]: [phone]          [facility]: [phone]     Narcotics Helpline - [phone]          [facility]: [phone]           Residential & Outpatient Substance Abuse Program - [facility] [place]: [phone]   [employer]  [place] and [place] - [phone] Psychological Services:          [place]: [phone]    Therapeutic Alternatives: [phone]          [place]           [address]: [phone]     (24 Hour)   Mobile Crisis:    [place]:  Radio producer on [place] [phone] [facility] on [place] [phone]   Walk In [place]  ([place] - [phone] or [phone]  [place]. [place] [phone]  [place] [address] [place] [phone]   Dental Assistance:  If  unable to pay or uninsured, contact: [facility]. to become qualified for the adult dental clinic. Patient must be enrolled in [facility] (uninsured, 0-200% FPL, qualifying info).  Enroll in [facility] first, then see Primary Care Physician assigned to you, the PCP makes a dental referral. [place] [facility] will receive referral and contacts patient for appointment.  Patients with Medicaid           [address], [place] (Children up to 37 + Pregnant Women) - [phone]  [place] - Suite [phone]  If unable to pay, or uninsured: contact [place] [phone] in [place] - ([place] only + Pregnant Women), [phone] in [place] only) to become qualified for the adult dental clinic  Must see if eligible to enroll in [place] before enrolling into the [facility] (exemption required)  [phone] for an appointment)  [email or web address];   [phone].  If not eligible for ACA, then go by Department of Health and Human Services to see if eligible for orange card.  [address], [place].  Once you get an orange card, you will have a Primary Care home who will then refer you to dental if needed.        Other Scientist, forensic:   [facility] [phone] (ext [phone])   [address]  Dr. Donn Pierini - [phone]   [place]   [facility]           [place], [place], [place], [address]           [phone], Ext. [phone]           2nd and 4th Thursday of the month at 6:30am (Simple extractions only - no wisdom teeth or surgery) First come/First serve -First 10 clients served           [facility] [place], [place] and [place] residents only)          [address], [place], [place], [address]           [phone]                    [place] Department           [phone]          [place] Department          [phone]         [facility]          [phone]   Transportation Options:  Ambulance - 911 - [phone] per ride Family Member to accompany patient (if stable) - [place] - [phone]  PART - [phone]  Taxi - [phone] - [place] [phone] (Application required)  [facility] - [phone]

## 2013-12-22 NOTE — MAU Note (Signed)
Pt reported intoxicated by EMS  No odor identified, cramping is making it difficult to get temp, red polish making oxygen sat not reading, no blood noted, reports gush with clots with severe cramps while at swimming pool, cramps comes and goes. G9, TAB x 3 and SAB x 3, Vaginal x3

## 2013-12-22 NOTE — MAU Note (Signed)
This pt has been a 1 on 1 care on this RN has not been able to chart.The patient get very restless with each cramp/spasm and tries to move off the bed and/or pull out IV.  She is alert, however when the pain starts she becomes non-communicative until you speak her name and encourage her to stay calm and take deep breaths.  Her Oxygen Sat has remain 99-100% and oxygen given as calming agent and she seem to response to this.  Aspirin was ordered due to syncope episode with spasm/cramp and she complained of chest tightness.  Bedpan given with help to obtain for UPT, which was negative.  Pt presented to EMS she was pregnant.  Plans for [place].  NS/NT stayed with RN to help control pt.  Patient revealed to NS/NT she does not have her children and female friend stated she has pictures all over the wall at home.  Panic attacks at home often become uncontrollable/per friend in room.

## 2013-12-22 NOTE — MAU Note (Signed)
2 glasses wine and started bleedng, clots and cramps

## 2013-12-22 NOTE — MAU Provider Note (Signed)
Chief Complaint: Alcohol Intoxication, Vaginal Bleeding and Abdominal Cramping  First Provider Initiated Contact with Patient 12/22/13 1806     SUBJECTIVE HPI: Kristen Roberson is a 38 y.o. [id number] [redacted] week gestation per pt (neg UPT at ED 12/17/2013) who presents to maternity admissions by EMS for bleeding and severe cramping. Intoxicated per EMS. Pt reports 2 glasses of wine at 1600. Pt has had multiple ED visits in last 6 months and has complex GI hx.  She also had Right salpingoopherectomy by Dr Toney Rakes in 09/2013, followed by exploratory lap and appendectomy by Dr Lucia Gaskins.  Her pain today is mostly in her left lower abdomen.    Past Medical History  Diagnosis Date  . Proctitis   . Cysts of both ovaries   . Seizures   . Anemia   . Anxiety   . Blood transfusion without reported diagnosis   . Depression   . Fatty liver 10/05/13   OB History  Gravida Para Term Preterm AB SAB TAB Ectopic Multiple Living  8 3   4 4    3     # Outcome Date GA Lbr Len/2nd Weight Sex Delivery Anes PTL Lv  8 CUR           7 SAB           6 SAB           5 SAB           4 SAB           3 PAR           2 PAR           1 PAR              Past Surgical History  Procedure Laterality Date  . Ovarian cyst removal    . Laparoscopy N/A 09/28/2013    Procedure: LAPAROSCOPY OPERATIVE;  Surgeon: Terrance Mass, MD;  Location: [facility];  Service: Gynecology;  Laterality: N/A;  . Laparoscopic appendectomy Right 09/28/2013    Procedure: APPENDECTOMY LAPAROSCOPIC;  Surgeon: Terrance Mass, MD;  Location: [facility];  Service: Gynecology;  Laterality: Right;  . Salpingoophorectomy Right 09/28/2013    Procedure: SALPINGO OOPHORECTOMY;  Surgeon: Terrance Mass, MD;  Location: [facility];  Service: Gynecology;  Laterality: Right;  . Colonoscopy N/A 09/30/2013    Procedure: COLONOSCOPY;  Surgeon: Lafayette Dragon, MD;  Location: [facility];  Service: Endoscopy;  Laterality: N/A;  . Esophagogastroduodenoscopy N/A 11/23/2013     Procedure: ESOPHAGOGASTRODUODENOSCOPY (EGD);  Surgeon: Jerene Bears, MD;  Location: [facility];  Service: Endoscopy;  Laterality: N/A;   History   Social History  . Marital Status: Legally Separated    Spouse Name: N/A    Number of Children: N/A  . Years of Education: N/A   Occupational History  . Not on file.   Social History Main Topics  . Smoking status: Never Smoker   . Smokeless tobacco: Not on file  . Alcohol Use: 2.4 oz/week    4 Glasses of wine per week     Comment: Drinks a bottle of wine daily   . Drug Use: No  . Sexual Activity: Yes   Other Topics Concern  . Not on file   Social History Narrative  . No narrative on file   No current facility-administered medications on file prior to encounter.   No current outpatient prescriptions on file prior to encounter.   Allergies  Allergen Reactions  .  Morphine And Related Anaphylaxis     Tolerated hydromorphone on 11/25/13.   . Tramadol Other (See Comments)    Seizures   . Penicillins Other (See Comments)    Unknown childhood reaction.    ROS: Pertinent items in HPI  OBJECTIVE Blood pressure 112/76, pulse 84, temperature 97.7 F (36.5 C), temperature source Oral, resp. rate 18, last menstrual period 09/25/2013, SpO2 98.00%. GENERAL: Well-developed, well-nourished female in severe distress.  HEENT: Normocephalic HEART: normal rate RESP: normal effort ABDOMEN: Soft, tenderness in left lower quadrant with guarding EXTREMITIES: Nontender, no edema NEURO: Alert and oriented SPECULUM EXAM: NEFG, deferred. No blood.  BIMANUAL: cervix closed; exam severely limited due to pain.   LAB RESULTS Results for orders placed during the hospital encounter of 12/22/13 (from the past 24 hour(s))  HCG, QUANTITATIVE, PREGNANCY     Status: None   Collection Time    12/22/13  6:20 PM      Result Value Ref Range   hCG, Beta Chain, Quant, S <1  <5 mIU/mL  CBC     Status: Abnormal   Collection Time    12/22/13  6:20 PM       Result Value Ref Range   WBC 2.8 (*) 4.0 - 10.5 K/uL   RBC 3.08 (*) 3.87 - 5.11 MIL/uL   Hemoglobin 9.6 (*) 12.0 - 15.0 g/dL   HCT 29.6 (*) 36.0 - 46.0 %   MCV 96.1  78.0 - 100.0 fL   MCH 31.2  26.0 - 34.0 pg   MCHC 32.4  30.0 - 36.0 g/dL   RDW 15.6 (*) 11.5 - 15.5 %   Platelets 215  150 - 400 K/uL  ETHANOL     Status: Abnormal   Collection Time    12/22/13  6:20 PM      Result Value Ref Range   Alcohol, Ethyl (B) 378 (*) 0 - 11 mg/dL  COMPREHENSIVE METABOLIC PANEL     Status: Abnormal   Collection Time    12/22/13  6:20 PM      Result Value Ref Range   Sodium 143  137 - 147 mEq/L   Potassium 3.8  3.7 - 5.3 mEq/L   Chloride 103  96 - 112 mEq/L   CO2 26  19 - 32 mEq/L   Glucose, Bld 111 (*) 70 - 99 mg/dL   BUN 5 (*) 6 - 23 mg/dL   Creatinine, Ser 0.44 (*) 0.50 - 1.10 mg/dL   Calcium 8.7  8.4 - 10.5 mg/dL   Total Protein 6.8  6.0 - 8.3 g/dL   Albumin 3.8  3.5 - 5.2 g/dL   AST 154 (*) 0 - 37 U/L   ALT 44 (*) 0 - 35 U/L   Alkaline Phosphatase 144 (*) 39 - 117 U/L   Total Bilirubin 0.4  0.3 - 1.2 mg/dL   GFR calc non Af Amer >90  >90 mL/min   GFR calc Af Amer >90  >90 mL/min  TROPONIN I     Status: None   Collection Time    12/22/13  6:20 PM      Result Value Ref Range   Troponin I <0.30  <0.30 ng/mL  URINALYSIS, ROUTINE W REFLEX MICROSCOPIC     Status: None   Collection Time    12/22/13  7:12 PM      Result Value Ref Range   Color, Urine YELLOW  YELLOW   APPearance CLEAR  CLEAR   Specific Gravity, Urine 1.010  1.005 - 1.030  pH 8.0  5.0 - 8.0   Glucose, UA NEGATIVE  NEGATIVE mg/dL   Hgb urine dipstick NEGATIVE  NEGATIVE   Bilirubin Urine NEGATIVE  NEGATIVE   Ketones, ur NEGATIVE  NEGATIVE mg/dL   Protein, ur NEGATIVE  NEGATIVE mg/dL   Urobilinogen, UA 0.2  0.0 - 1.0 mg/dL   Nitrite NEGATIVE  NEGATIVE   Leukocytes, UA NEGATIVE  NEGATIVE  URINE RAPID DRUG SCREEN (HOSP PERFORMED)     Status: None   Collection Time    12/22/13  7:12 PM      Result Value Ref Range    Opiates NONE DETECTED  NONE DETECTED   Cocaine NONE DETECTED  NONE DETECTED   Benzodiazepines NONE DETECTED  NONE DETECTED   Amphetamines NONE DETECTED  NONE DETECTED   Tetrahydrocannabinol NONE DETECTED  NONE DETECTED   Barbiturates NONE DETECTED  NONE DETECTED  POCT PREGNANCY, URINE     Status: None   Collection Time    12/22/13  7:13 PM      Result Value Ref Range   Preg Test, Ur NEGATIVE  NEGATIVE    IMAGING Dg Chest 2 View  12/01/2013   CLINICAL DATA:  Shortness of breath.  Chest pain.  Foot edema.  EXAM: CHEST  2 VIEW  COMPARISON:  DG CHEST 2 VIEW dated 11/28/2013  FINDINGS: The heart size and mediastinal contours are within normal limits. Both lungs are clear. The visualized skeletal structures are unremarkable.  IMPRESSION: No active cardiopulmonary disease.   Electronically Signed   By: Lucienne Capers M.D.   On: 12/01/2013 00:12   Dg Chest 2 View  11/28/2013   CLINICAL DATA:  Bilateral lower leg swelling  EXAM: CHEST  2 VIEW  COMPARISON:  11/25/2013  FINDINGS: The heart size and mediastinal contours are within normal limits. Both lungs are clear. The visualized skeletal structures are unremarkable.  IMPRESSION: No active cardiopulmonary disease.   Electronically Signed   By: Inez Catalina M.D.   On: 11/28/2013 21:51    Ct Abdomen Pelvis W Contrast  12/17/2013   CLINICAL DATA:  Abdominal pain. Worsening abdominal pain that began last night. Abdominal trauma yesterday.  EXAM: CT ABDOMEN AND PELVIS WITH CONTRAST  TECHNIQUE: Multidetector CT imaging of the abdomen and pelvis was performed using the standard protocol following bolus administration of intravenous contrast.  CONTRAST:  155mL OMNIPAQUE IOHEXOL 300 MG/ML  SOLN  COMPARISON:  10/28/2013.  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 No aggressive osseous lesions. No acute osseous injury.  Lung Bases: Normal.  Liver: Suggestion of hepatosteatosis. No focal mass  lesion or hepatic injury. Heterogeneous attenuation in the medial right hepatic lobe likely represents focal fatty infiltration. This is unchanged compared to 10/28/2013.  Spleen:  Normal.  Gallbladder:  Normal.  Distended.  Common bile duct:  Normal.  Pancreas:  Normal.  Adrenal glands:  Normal.  Kidneys: Normal bilaterally. Normal enhancement and excretion. Both ureters appear normal.  Stomach:  Collapsed.  No inflammatory changes.  Small bowel: Normal. No mesenteric adenopathy. No inflammatory changes.  Colon:   Normal.  Pelvic Genitourinary:  Normal.  Vasculature: Normal.  Body Wall: Normal.  IMPRESSION: No acute traumatic injury to the abdomen or pelvis. Hepatosteatosis.   Electronically Signed   By: Dereck Ligas M.D.   On: 12/17/2013 19:54   Dg Abd Acute W/chest  11/25/2013   CLINICAL DATA:  Abdominal pain.  Nausea and vomiting.  EXAM: ACUTE ABDOMEN SERIES (ABDOMEN 2  VIEW & CHEST 1 VIEW)  COMPARISON:  DG ABD PORTABLE 2V dated 11/23/2013; CT ABD/PELVIS W CM dated 10/28/2013; CT ABD/PELVIS W CM dated 10/05/2013  FINDINGS: Bowel gas pattern unremarkable without evidence of obstruction or significant ileus. Expected stool burden in the colon. No evidence of free air on the erect image. Air-fluid levels in the ascending colon consistent liquid stool. No abnormal calcifications. Apparent scoliosis is felt to be positional.  Cardiomediastinal silhouette unremarkable. Lungs clear. Bronchovascular markings normal. Pulmonary vascularity normal. No visible pleural effusions. No pneumothorax.  IMPRESSION: No acute abdominal or pulmonary abnormality.   Electronically Signed   By: Evangeline Dakin M.D.   On: 11/25/2013 15:59   Dg Abd Portable 2v  11/23/2013   CLINICAL DATA:  Abdominal pain.  EXAM: PORTABLE ABDOMEN - 2 VIEW  COMPARISON:  10/28/2013  FINDINGS: The bowel gas pattern is negative for obstruction. There is no evidence of free air. No radio-opaque calculi or other significant radiographic abnormality is seen.   IMPRESSION: Negative.   Electronically Signed   By: Jorje Guild M.D.   On: 11/23/2013 05:36    MAU COURSE Toradol 60 mg IM, Dilaudid 1 mg IV, Phenergan 25 mg IV, Pyridium 200 mg PO, Aspirin chewable 324 mg Pelvic ultrasound  US Transvaginal Non-ob  12/22/2013   CLINICAL DATA:  Left lower quadrant pain; suspect ovarian torsion ; history of previous right salpingo oophorectomy.  EXAM: TRANSVAGINAL ULTRASOUND OF PELVIS  DOPPLER ULTRASOUND OF OVARIES  TECHNIQUE: Transvaginal ultrasound examination of the pelvis was performed including evaluation of the uterus, ovaries, adnexal regions, and pelvic cul-de-sac.  Color and duplex Doppler ultrasound was utilized to evaluate blood flow to the ovaries.  COMPARISON:  None.  FINDINGS: Uterus  Measurements: 9.2 x 4.3 x 5.5 cm. No fibroids or other mass visualized.  Endometrium  Thickness: 12.4 mm.  No focal abnormality visualized.  Right ovary  The right ovary is surgically absent. No right adnexal masses demonstrated.  Left ovary  Measurements: 3.9 x 1.8 x 2.6 cm. Normal appearance/no adnexal mass.  Pulsed Doppler evaluation demonstrates normal low-resistance arterial and venous waveforms in the left ovary.  No free fluid is present within the pelvis.  IMPRESSION: 1. The left ovary is normal in echotexture and vascularity. 2. The uterus is normal. 3. The right ovary is surgically absent.   Electronically Signed   By: David  Martinique   On: 12/22/2013 21:21   [place] Art/ven Flow Abd Pelv Doppler  12/22/2013   CLINICAL DATA:  Left lower quadrant pain; suspect ovarian torsion ; history of previous right salpingo oophorectomy.  EXAM: TRANSVAGINAL ULTRASOUND OF PELVIS  DOPPLER ULTRASOUND OF OVARIES  TECHNIQUE: Transvaginal ultrasound examination of the pelvis was performed including evaluation of the uterus, ovaries, adnexal regions, and pelvic cul-de-sac.  Color and duplex Doppler ultrasound was utilized to evaluate blood flow to the ovaries.  COMPARISON:  None.  FINDINGS:  Uterus  Measurements: 9.2 x 4.3 x 5.5 cm. No fibroids or other mass visualized.  Endometrium  Thickness: 12.4 mm.  No focal abnormality visualized.  Right ovary  The right ovary is surgically absent. No right adnexal masses demonstrated.  Left ovary  Measurements: 3.9 x 1.8 x 2.6 cm. Normal appearance/no adnexal mass.  Pulsed Doppler evaluation demonstrates normal low-resistance arterial and venous waveforms in the left ovary.  No free fluid is present within the pelvis.  IMPRESSION: 1. The left ovary is normal in echotexture and vascularity. 2. The uterus is normal. 3. The right ovary is surgically absent.  Electronically Signed   By: David  Martinique   On: 12/22/2013 21:21   After pt returned from ultrasound, pt sleeping and difficult to arouse.  Upon arousal, pt coherent, oriented x3, reports she is still having pain but does not want further evaluation in emergency room. She desires to go home with her s/o, who is present with her.    ASSESSMENT 1. Alcoholism   2. Colicky LLQ abdominal pain     PLAN Consult Dr Charlies Constable Recommend transfer to [facility] if normal pelvic U/S, no suspected GI diagnosis Pt refused transfer to ED.  See above note under MAU management. Discharge home Discussed with pt recommendation to follow up with gyn provider, Dr Toney Rakes, and with primary care to further evaluate abdominal pain.  She has had referral to [facility] in the past but did not keep appt.  Recommend she return for GI evaluation. Also discussed with pt and s/o recommendation for treatment for alcohol addiction.  S/O reports he also struggles with addiction to alcohol.  Pt given information on drug and alcohol addiction programs in [place].      Follow-up Information   Follow up with Terrance Mass, MD. (For gyn concerns)    Specialty:  Gynecology   Contact information:   [address] [place] [address] [phone]       Follow up with [facility]. (As needed for emergencies)    Specialty:  Emergency Medicine   Contact information:   [address] [id number] [place] [address] [phone]      Follow up with [facility]. (As recommended to follow up for abdominal pain/GI issues)    Specialty:  Gastroenterology   Contact information:   [place] [address] [phone]       Medication List    Notice   You have not been prescribed any medications.       SELENE PELTZER, CNM 12/22/2013  10:48 PM

## 2013-12-22 NOTE — MAU Note (Signed)
Syncope episode with last cramp,( 10 sec)  pt is lying down, post Toradol,

## 2013-12-23 LAB — GC/CHLAMYDIA PROBE AMP
CT PROBE, AMP APTIMA: NEGATIVE
GC PROBE AMP APTIMA: NEGATIVE

## 2013-12-23 NOTE — ED Provider Notes (Signed)
Medical screening examination/treatment/procedure(s) were conducted as a shared visit with non-physician practitioner(s) and myself.  I personally evaluated the patient during the encounter.   Pt with hx chronic abd pain, c/o mid to upper abd pain. abd soft nt. Afeb. Labs. Reviewed prior imaging.   Mirna Mires, MD 12/23/13 [phone]

## 2013-12-30 ENCOUNTER — Encounter (HOSPITAL_COMMUNITY): Payer: Self-pay | Admitting: Emergency Medicine

## 2013-12-30 ENCOUNTER — Emergency Department (HOSPITAL_COMMUNITY)
Admission: EM | Admit: 2013-12-30 | Discharge: 2013-12-31 | Discharge status: Against Medical Advice | Payer: Self-pay | Attending: Emergency Medicine | Admitting: Emergency Medicine

## 2013-12-30 ENCOUNTER — Emergency Department (HOSPITAL_COMMUNITY)
Admission: EM | Admit: 2013-12-30 | Discharge: 2013-12-30 | Discharge status: Against Medical Advice | Payer: Self-pay | Attending: Emergency Medicine | Admitting: Emergency Medicine

## 2013-12-30 DIAGNOSIS — R109 Unspecified abdominal pain: Secondary | ICD-10-CM | POA: Insufficient documentation

## 2013-12-30 DIAGNOSIS — M79609 Pain in unspecified limb: Secondary | ICD-10-CM | POA: Insufficient documentation

## 2013-12-30 NOTE — ED Notes (Signed)
Pt states she refuses to wait to be seen and leaves with female friend

## 2013-12-30 NOTE — ED Notes (Signed)
Pt wants to be seen again per female friend

## 2013-12-30 NOTE — ED Notes (Signed)
Pt arrived to the ED with a right lower quadrant abdominal pain.  Pt also is having sensitivity to the legs.  Pt states abdomen pain is a sharp and painful.  Pt also has had her appendix and ovary on the right side removed.  Pt's legs has a pian that feels like pins and needles.  Pt also complains of swelling.

## 2013-12-30 NOTE — ED Notes (Signed)
Pt says she wants to check back in now,

## 2013-12-30 NOTE — ED Notes (Signed)
Pt states now she doesn't want to be seen.

## 2014-01-01 ENCOUNTER — Telehealth: Payer: Self-pay | Admitting: Internal Medicine

## 2014-01-01 ENCOUNTER — Emergency Department (HOSPITAL_COMMUNITY): Admission: EM | Admit: 2014-01-01 | Discharge: 2014-01-01 | Discharge status: Against Medical Advice | Payer: Self-pay

## 2014-01-01 ENCOUNTER — Encounter (HOSPITAL_COMMUNITY): Payer: Self-pay | Admitting: Emergency Medicine

## 2014-01-01 ENCOUNTER — Emergency Department (HOSPITAL_COMMUNITY)
Admission: EM | Admit: 2014-01-01 | Discharge: 2014-01-01 | Discharge status: Against Medical Advice | Payer: Self-pay | Attending: Emergency Medicine | Admitting: Emergency Medicine

## 2014-01-01 DIAGNOSIS — F3289 Other specified depressive episodes: Secondary | ICD-10-CM | POA: Insufficient documentation

## 2014-01-01 DIAGNOSIS — R600 Localized edema: Secondary | ICD-10-CM

## 2014-01-01 DIAGNOSIS — Z8669 Personal history of other diseases of the nervous system and sense organs: Secondary | ICD-10-CM | POA: Insufficient documentation

## 2014-01-01 DIAGNOSIS — F411 Generalized anxiety disorder: Secondary | ICD-10-CM | POA: Insufficient documentation

## 2014-01-01 DIAGNOSIS — F329 Major depressive disorder, single episode, unspecified: Secondary | ICD-10-CM | POA: Insufficient documentation

## 2014-01-01 DIAGNOSIS — M7989 Other specified soft tissue disorders: Secondary | ICD-10-CM | POA: Insufficient documentation

## 2014-01-01 DIAGNOSIS — G8929 Other chronic pain: Secondary | ICD-10-CM | POA: Insufficient documentation

## 2014-01-01 DIAGNOSIS — Z8719 Personal history of other diseases of the digestive system: Secondary | ICD-10-CM | POA: Insufficient documentation

## 2014-01-01 DIAGNOSIS — N949 Unspecified condition associated with female genital organs and menstrual cycle: Secondary | ICD-10-CM | POA: Insufficient documentation

## 2014-01-01 DIAGNOSIS — R102 Pelvic and perineal pain: Secondary | ICD-10-CM

## 2014-01-01 DIAGNOSIS — Z880 Allergy status to penicillin: Secondary | ICD-10-CM | POA: Insufficient documentation

## 2014-01-01 DIAGNOSIS — R609 Edema, unspecified: Secondary | ICD-10-CM | POA: Insufficient documentation

## 2014-01-01 DIAGNOSIS — Z862 Personal history of diseases of the blood and blood-forming organs and certain disorders involving the immune mechanism: Secondary | ICD-10-CM | POA: Insufficient documentation

## 2014-01-01 LAB — CBC WITH DIFFERENTIAL/PLATELET
BASOS PCT: 2 % — AB (ref 0–1)
Basophils Absolute: 0.1 10*3/uL (ref 0.0–0.1)
EOS ABS: 0 10*3/uL (ref 0.0–0.7)
Eosinophils Relative: 0 % (ref 0–5)
HCT: 26.8 % — ABNORMAL LOW (ref 36.0–46.0)
HEMOGLOBIN: 8.8 g/dL — AB (ref 12.0–15.0)
Lymphocytes Relative: 40 % (ref 12–46)
Lymphs Abs: 1.1 10*3/uL (ref 0.7–4.0)
MCH: 31.4 pg (ref 26.0–34.0)
MCHC: 32.8 g/dL (ref 30.0–36.0)
MCV: 95.7 fL (ref 78.0–100.0)
MONO ABS: 0.3 10*3/uL (ref 0.1–1.0)
Monocytes Relative: 10 % (ref 3–12)
NEUTROS PCT: 48 % (ref 43–77)
Neutro Abs: 1.3 10*3/uL — ABNORMAL LOW (ref 1.7–7.7)
Platelets: 188 10*3/uL (ref 150–400)
RBC: 2.8 MIL/uL — ABNORMAL LOW (ref 3.87–5.11)
RDW: 16.4 % — ABNORMAL HIGH (ref 11.5–15.5)
WBC: 2.8 10*3/uL — ABNORMAL LOW (ref 4.0–10.5)

## 2014-01-01 LAB — COMPREHENSIVE METABOLIC PANEL
ALBUMIN: 3.8 g/dL (ref 3.5–5.2)
ALT: 46 U/L — AB (ref 0–35)
AST: 140 U/L — ABNORMAL HIGH (ref 0–37)
Alkaline Phosphatase: 140 U/L — ABNORMAL HIGH (ref 39–117)
BILIRUBIN TOTAL: 0.3 mg/dL (ref 0.3–1.2)
BUN: 7 mg/dL (ref 6–23)
CALCIUM: 8.5 mg/dL (ref 8.4–10.5)
CHLORIDE: 103 meq/L (ref 96–112)
CO2: 29 mEq/L (ref 19–32)
CREATININE: 0.41 mg/dL — AB (ref 0.50–1.10)
GFR calc Af Amer: >90 mL/min (ref >90)
Glucose, Bld: 108 mg/dL — ABNORMAL HIGH (ref 70–99)
Potassium: 3.7 mEq/L (ref 3.7–5.3)
SODIUM: 143 meq/L (ref 137–147)
Total Protein: 6.9 g/dL (ref 6.0–8.3)

## 2014-01-01 LAB — URIC ACID: Uric Acid, Serum: 4.6 mg/dL (ref 2.4–7.0)

## 2014-01-01 LAB — ETHANOL: Alcohol, Ethyl (B): 375 mg/dL — ABNORMAL HIGH (ref 0–11)

## 2014-01-01 MED ORDER — IBUPROFEN 100 MG/5ML PO SUSP
400.0000 mg | Freq: Once | ORAL | Status: DC
  Filled 2014-01-01: qty 20

## 2014-01-01 MED ORDER — NAPROXEN 500 MG PO TABS: 500.0000 mg | ORAL_TABLET | Freq: Once | ORAL | Status: DC

## 2014-01-01 NOTE — ED Notes (Signed)
Pt states she cannot give urine sample at this time.

## 2014-01-01 NOTE — ED Notes (Signed)
Pt states having RLQ abdominal pain and ankle swelling, pt has had this problem for a while.

## 2014-01-01 NOTE — ED Notes (Signed)
Patient ambulatory without difficulty at this time.

## 2014-01-01 NOTE — ED Notes (Signed)
Per EMS, patient c/o chronic ankle pain. Family reported syncopal episode of 5 seconds, patient denies. Patient has been drinking wine tonight. Patient A&O, NAD noted. EKG done by EMS.

## 2014-01-01 NOTE — Telephone Encounter (Signed)
Called pt and LVM regarding appt for ED f/u.

## 2014-01-01 NOTE — ED Provider Notes (Signed)
CSN: [id number]     Arrival date & time 01/01/14  2042 History   First MD Initiated Contact with Patient 01/01/14 2154     This chart was scribed for non-physician practitioner, Hazel Sams PA-C, working with Orlie Dakin, MD by Forrestine Him, ED Scribe. This patient was seen in room WTR8/WTR8 and the patient's care was started at 10:11 PM.   Chief Complaint  Patient presents with  . Abdominal Pain  . Joint Swelling   The history is provided by the patient. No language interpreter was used.    HPI Comments: Kristen Roberson is a 38 y.o. female with a PMHx of seizures,  Alcohol abuse, chronic abdominal/pelvic pain, anxiety, and depression who presents to the Emergency Department complaining of intermittent, moderate bilateral lower extremity swelling that has been ongoing for some time now. Legs last known normal around 2 PM today. Pt describes the discomfort as paresthesia.  She admits to 2 episodes of light-headedness in last few days. She also reports ongoing pelvic pain currently rated 7/10 that is constant and moderate. However, she has denied prescription tablet formed medication in the past. States alternative liquid medication is too expensive to fill at this time. She admits to drinking alcohol frequently but denies worsening swelling with drinking.  States she is a runner and typically runs 16 miles daily. However, she states last time she ran was last year. She reports a PSHx of an appendectomy 09/2013 and Salpingoophorectomy 09/2013.  She has no other pertinent past medical history. No other concerns this visit.   Past Medical History  Diagnosis Date  . Proctitis   . Cysts of both ovaries   . Seizures   . Anemia   . Anxiety   . Blood transfusion without reported diagnosis   . Depression   . Fatty liver 10/05/13   Past Surgical History  Procedure Laterality Date  . Ovarian cyst removal    . Laparoscopy N/A 09/28/2013    Procedure: LAPAROSCOPY OPERATIVE;  Surgeon: Terrance Mass,  MD;  Location: [facility];  Service: Gynecology;  Laterality: N/A;  . Laparoscopic appendectomy Right 09/28/2013    Procedure: APPENDECTOMY LAPAROSCOPIC;  Surgeon: Terrance Mass, MD;  Location: [facility];  Service: Gynecology;  Laterality: Right;  . Salpingoophorectomy Right 09/28/2013    Procedure: SALPINGO OOPHORECTOMY;  Surgeon: Terrance Mass, MD;  Location: [facility];  Service: Gynecology;  Laterality: Right;  . Colonoscopy N/A 09/30/2013    Procedure: COLONOSCOPY;  Surgeon: Lafayette Dragon, MD;  Location: [facility];  Service: Endoscopy;  Laterality: N/A;  . Esophagogastroduodenoscopy N/A 11/23/2013    Procedure: ESOPHAGOGASTRODUODENOSCOPY (EGD);  Surgeon: Jerene Bears, MD;  Location: [facility];  Service: Endoscopy;  Laterality: N/A;   Family History  Problem Relation Age of Onset  . Diabetes Mother   . Hyperlipidemia Mother   . Stroke Mother   . Diabetes Father    History  Substance Use Topics  . Smoking status: Never Smoker   . Smokeless tobacco: Not on file  . Alcohol Use: 2.4 oz/week    4 Glasses of wine per week     Comment: Drinks a bottle of wine daily    OB History   Grav Para Term Preterm Abortions TAB SAB Ect Mult Living   8 3   4  4   3      Review of Systems  Constitutional: Negative for fever and chills.  HENT: Negative for congestion.   Eyes: Negative for redness.  Respiratory: Negative for  cough and shortness of breath.   Cardiovascular: Positive for leg swelling. Negative for chest pain and palpitations.  Gastrointestinal: Negative for vomiting and diarrhea.  Genitourinary: Positive for pelvic pain.  Musculoskeletal:       Bilateral lower extremity swelling  Skin: Negative for rash.  Psychiatric/Behavioral: Negative for confusion.  All other systems reviewed and are negative.     Allergies  Morphine and related; Tramadol; and Penicillins  Home Medications   Prior to Admission medications   Not on File   Triage Vitals: BP 114/71  Pulse 108   Temp(Src) 98.2 F (36.8 C) (Oral)  Resp 20  SpO2 98%  LMP 09/25/2013   Physical Exam  Nursing note and vitals reviewed. Constitutional: She is oriented to person, place, and time. She appears well-developed and well-nourished. No distress.  HENT:  Head: Normocephalic.  Eyes: EOM are normal.  Neck: Normal range of motion.  Cardiovascular: Normal rate and regular rhythm.   No murmur heard. Pulmonary/Chest: Effort normal and breath sounds normal. No respiratory distress. She has no wheezes.  Abdominal: Soft. She exhibits no distension. There is tenderness in the right lower quadrant. There is no rebound, no guarding and no CVA tenderness.  Laparoscopic surgical scars consistent with history of previous surgeries.  Musculoskeletal: Normal range of motion. She exhibits edema. She exhibits no tenderness.  There is mild swelling to bilateral feet and ankles. Patient does not exhibit any signs of discomfort with firm palpation. There is no skin changes or erythema. Normal dorsal pedal pulses in the feet. Normal capillary refill to toes.  Neurological: She is alert and oriented to person, place, and time.  Skin: Skin is warm and dry. No rash noted.  Psychiatric: She has a normal mood and affect. Her behavior is normal.    ED Course  Procedures   DIAGNOSTIC STUDIES: Oxygen Saturation is 98% on RA, Normal by my interpretation.    COORDINATION OF CARE: 10:08 PM- Will order Ethanol, CBC, Uric acid, and CMP. Discussed treatment plan with pt at bedside and pt agreed to plan.     In patient appears well in no acute distress. She does have very slight tachycardia. She has multiple frequent visits to the emergency room for chronic right lower abdomen pain. She has history of appendectomy and oophorectomy. She had recent workup for this 10 days ago and was unremarkable. Today she has continued chronic pain complaints as well as complaints of bilateral lower feet and ankle swelling with pain. She did  have normal albumin and liver function tests 10 days ago. She continues to report heavy alcohol use. Denies any chest pain or shortness of breath.  I offered patient pain medications. She reports that she is not able to swallow pills and side offered liquid ibuprofen. Shortly after this nurse informed me that patient wished to leave the emergency room and did not want to have testing or further treatment. Pt was advised of the risks of leaving without further testing.  She expressed her understanding. She was advised to follow up with her doctors tomorrow for her chronic pelvic pain and leg swelling.    MDM   Final diagnoses:  Chronic pelvic pain in female  Lower extremity edema      I personally performed the services described in this documentation, which was scribed in my presence. The recorded information has been reviewed and is accurate.   Martie Lee, PA-C 01/01/14 2243

## 2014-01-01 NOTE — ED Notes (Signed)
Pt requesting to leave AMA, pt states she has to get home and leave now, refusing any further treatment. PA made aware.

## 2014-01-02 NOTE — ED Provider Notes (Signed)
Medical screening examination/treatment/procedure(s) were performed by non-physician practitioner and as supervising physician I was immediately available for consultation/collaboration.   EKG Interpretation None       Orlie Dakin, MD 01/02/14 0041

## 2014-01-05 ENCOUNTER — Emergency Department (HOSPITAL_COMMUNITY)
Admission: EM | Admit: 2014-01-05 | Discharge: 2014-01-06 | Disposition: A | Discharge status: Other Acute Inpatient Hospital | Payer: Federal, State, Local not specified - Other | Attending: Emergency Medicine | Admitting: Emergency Medicine

## 2014-01-05 ENCOUNTER — Encounter (HOSPITAL_COMMUNITY): Payer: Self-pay | Admitting: Emergency Medicine

## 2014-01-05 DIAGNOSIS — F10230 Alcohol dependence with withdrawal, uncomplicated: Secondary | ICD-10-CM

## 2014-01-05 DIAGNOSIS — R109 Unspecified abdominal pain: Secondary | ICD-10-CM | POA: Insufficient documentation

## 2014-01-05 DIAGNOSIS — Z8659 Personal history of other mental and behavioral disorders: Secondary | ICD-10-CM | POA: Insufficient documentation

## 2014-01-05 DIAGNOSIS — F4321 Adjustment disorder with depressed mood: Secondary | ICD-10-CM | POA: Insufficient documentation

## 2014-01-05 DIAGNOSIS — Z8719 Personal history of other diseases of the digestive system: Secondary | ICD-10-CM | POA: Insufficient documentation

## 2014-01-05 DIAGNOSIS — F419 Anxiety disorder, unspecified: Secondary | ICD-10-CM

## 2014-01-05 DIAGNOSIS — F329 Major depressive disorder, single episode, unspecified: Secondary | ICD-10-CM | POA: Diagnosis present

## 2014-01-05 DIAGNOSIS — F10239 Alcohol dependence with withdrawal, unspecified: Secondary | ICD-10-CM | POA: Diagnosis present

## 2014-01-05 DIAGNOSIS — R45851 Suicidal ideations: Secondary | ICD-10-CM | POA: Insufficient documentation

## 2014-01-05 DIAGNOSIS — T398X2A Poisoning by other nonopioid analgesics and antipyretics, not elsewhere classified, intentional self-harm, initial encounter: Secondary | ICD-10-CM

## 2014-01-05 DIAGNOSIS — Z880 Allergy status to penicillin: Secondary | ICD-10-CM | POA: Insufficient documentation

## 2014-01-05 DIAGNOSIS — T1491XA Suicide attempt, initial encounter: Secondary | ICD-10-CM

## 2014-01-05 DIAGNOSIS — F1020 Alcohol dependence, uncomplicated: Secondary | ICD-10-CM | POA: Diagnosis present

## 2014-01-05 DIAGNOSIS — T391X2A Poisoning by 4-Aminophenol derivatives, intentional self-harm, initial encounter: Secondary | ICD-10-CM

## 2014-01-05 DIAGNOSIS — F10930 Alcohol use, unspecified with withdrawal, uncomplicated: Secondary | ICD-10-CM

## 2014-01-05 DIAGNOSIS — Z862 Personal history of diseases of the blood and blood-forming organs and certain disorders involving the immune mechanism: Secondary | ICD-10-CM | POA: Insufficient documentation

## 2014-01-05 DIAGNOSIS — F10939 Alcohol use, unspecified with withdrawal, unspecified: Secondary | ICD-10-CM | POA: Diagnosis present

## 2014-01-05 DIAGNOSIS — T391X1A Poisoning by 4-Aminophenol derivatives, accidental (unintentional), initial encounter: Secondary | ICD-10-CM | POA: Insufficient documentation

## 2014-01-05 DIAGNOSIS — Z87448 Personal history of other diseases of urinary system: Secondary | ICD-10-CM | POA: Insufficient documentation

## 2014-01-05 DIAGNOSIS — T394X2A Poisoning by antirheumatics, not elsewhere classified, intentional self-harm, initial encounter: Secondary | ICD-10-CM | POA: Insufficient documentation

## 2014-01-05 NOTE — ED Notes (Signed)
EDP with patient at this time

## 2014-01-05 NOTE — ED Provider Notes (Signed)
CSN: [id number]     Arrival date & time 01/05/14  2318 History   First MD Initiated Contact with Patient 01/05/14 2321     Chief Complaint  Patient presents with  . Ingestion     (Consider location/radiation/quality/duration/timing/severity/associated sxs/prior Treatment) HPI Comments: 38 year old female with chronic pelvic pain, dysmenorrhea, GI bleeding, varices, hypokalemia, depression, alcohol neuropathy, adjustment disorder presents after possible ingestion of Tylenol. Patient says she is frustrated and tired of right lower abdominal chronic pain that she's had for months now. Patient drinks alcohol regularly and to hold the ems took 100 Tylenol however now she is denying this. She said she is upset that her husband had an affair on her and that's the main reason she's here. The pain is not different than her chronic pain. Pain is intermittent nonradiating. Patient denies vaginal symptoms. Patient has intermittent blood in her stool from hemorrhoids. Patient has been admitted in the past for electrolytes issues.  Patient has had recent suicidal ideation and denies being admitted inpatient in the past.  Patient is a 38 y.o. female presenting with Ingested Medication. The history is provided by the patient.  Ingestion Associated symptoms include abdominal pain. Pertinent negatives include no chest pain, no headaches and no shortness of breath.    Past Medical History  Diagnosis Date  . Proctitis   . Cysts of both ovaries   . Seizures   . Anemia   . Anxiety   . Blood transfusion without reported diagnosis   . Depression   . Fatty liver 10/05/13   Past Surgical History  Procedure Laterality Date  . Ovarian cyst removal    . Laparoscopy N/A 09/28/2013    Procedure: LAPAROSCOPY OPERATIVE;  Surgeon: Terrance Mass, MD;  Location: [facility];  Service: Gynecology;  Laterality: N/A;  . Laparoscopic appendectomy Right 09/28/2013    Procedure: APPENDECTOMY LAPAROSCOPIC;  Surgeon: Terrance Mass, MD;  Location: [facility];  Service: Gynecology;  Laterality: Right;  . Salpingoophorectomy Right 09/28/2013    Procedure: SALPINGO OOPHORECTOMY;  Surgeon: Terrance Mass, MD;  Location: [facility];  Service: Gynecology;  Laterality: Right;  . Colonoscopy N/A 09/30/2013    Procedure: COLONOSCOPY;  Surgeon: Lafayette Dragon, MD;  Location: [facility];  Service: Endoscopy;  Laterality: N/A;  . Esophagogastroduodenoscopy N/A 11/23/2013    Procedure: ESOPHAGOGASTRODUODENOSCOPY (EGD);  Surgeon: Jerene Bears, MD;  Location: [facility];  Service: Endoscopy;  Laterality: N/A;  . Appendectomy     Family History  Problem Relation Age of Onset  . Diabetes Mother   . Hyperlipidemia Mother   . Stroke Mother   . Diabetes Father    History  Substance Use Topics  . Smoking status: Never Smoker   . Smokeless tobacco: Not on file  . Alcohol Use: 2.4 oz/week    4 Glasses of wine per week     Comment: Drinks a bottle of wine daily    OB History   Grav Para Term Preterm Abortions TAB SAB Ect Mult Living   8 3   4  4   3      Review of Systems  Constitutional: Negative for fever and chills.  HENT: Negative for congestion.   Eyes: Negative for visual disturbance.  Respiratory: Negative for shortness of breath.   Cardiovascular: Negative for chest pain.  Gastrointestinal: Positive for abdominal pain. Negative for vomiting.  Genitourinary: Negative for dysuria and flank pain.  Musculoskeletal: Negative for back pain, neck pain and neck stiffness.  Skin: Negative for rash.  Neurological: Negative for light-headedness and headaches.  Psychiatric/Behavioral: Positive for suicidal ideas and dysphoric mood. The patient is nervous/anxious.       Allergies  Morphine and related; Tramadol; and Penicillins  Home Medications   Prior to Admission medications   Not on File   BP 117/78  Pulse 97  Temp(Src) 98 F (36.7 C) (Oral)  Resp 18  Ht 5\' 6"  (1.676 m)  Wt 105 lb (47.628 kg)  BMI 16.96  kg/m2  SpO2 99%  LMP 09/25/2013 Physical Exam  Nursing note and vitals reviewed. Constitutional: She is oriented to person, place, and time. She appears well-developed and well-nourished.  HENT:  Head: Normocephalic and atraumatic.  Eyes: Conjunctivae are normal. Right eye exhibits no discharge. Left eye exhibits no discharge.  Neck: Normal range of motion. Neck supple. No tracheal deviation present.  Cardiovascular: Normal rate and regular rhythm.   Pulmonary/Chest: Effort normal and breath sounds normal.  Abdominal: Soft. She exhibits no distension. There is tenderness (RLQ mild (pt had appendix and right ovary removed in the past). There is no guarding.  Musculoskeletal: She exhibits no edema.  Neurological: She is alert and oriented to person, place, and time.  Skin: Skin is warm. No rash noted.  Psychiatric: Her mood appears anxious. Her affect is not blunt. Her speech is not rapid and/or pressured. Thought content is not delusional. She exhibits a depressed mood. She expresses suicidal ideation. She expresses suicidal plans. She expresses no homicidal plans.    ED Course  Procedures (including critical care time) Labs Review Labs Reviewed  COMPREHENSIVE METABOLIC PANEL - Abnormal; Notable for the following:    Glucose, Bld 117 (*)    Creatinine, Ser 0.42 (*)    Calcium 8.3 (*)    AST 243 (*)    ALT 55 (*)    Alkaline Phosphatase 157 (*)    All other components within normal limits  CBC WITH DIFFERENTIAL - Abnormal; Notable for the following:    WBC 1.8 (*)    RBC 3.24 (*)    Hemoglobin 9.9 (*)    HCT 30.7 (*)    RDW 15.7 (*)    Platelets 147 (*)    Neutrophils Relative % 34 (*)    Lymphocytes Relative 50 (*)    Monocytes Relative 15 (*)    Neutro Abs 0.6 (*)    All other components within normal limits  ETHANOL - Abnormal; Notable for the following:    Alcohol, Ethyl (B) 477 (*)    All other components within normal limits  HEPATIC FUNCTION PANEL - Abnormal;  Notable for the following:    Total Protein 5.9 (*)    Albumin 3.4 (*)    AST 198 (*)    ALT 47 (*)    Alkaline Phosphatase 141 (*)    All other components within normal limits  BASIC METABOLIC PANEL - Abnormal; Notable for the following:    Glucose, Bld 106 (*)    Creatinine, Ser 0.43 (*)    All other components within normal limits  ACETAMINOPHEN LEVEL  URINE RAPID DRUG SCREEN (HOSP PERFORMED)  URINALYSIS, ROUTINE W REFLEX MICROSCOPIC  MAGNESIUM  ACETAMINOPHEN LEVEL  ACETAMINOPHEN LEVEL    Imaging Review No results found.   EKG Interpretation None      MDM   Final diagnoses:  Suicide attempt  Adjustment disorder with depressed mood  Alcohol abuse LFT elevation   Patient with history of alcohol abuse depression chronic pain presents after reported Tylenol ingestion. Unknown time but reported  100 pills. Patient has chronic right lower quadrant abdominal pain on exam. Plan for psychiatric blood work and workup.  Considering patient's alcohol level patient alert oriented and conversant. Patient denies Tylenol ingestion however delta acetaminophen levels obtained in negative. Poison control contacted regarding possible Tylenol ingestion and difficulty assessing liver function do to chronic elevation alcohol abuse. Poison control recommended repeating labs if unremarkable medically clear at this time.  No acute changes in ER and repeat labs unremarkable. Patient medically clear from toxicologic standpoint at this time. Psychiatric disposition pending. Patient denied actually taking Tylenol.    Mariea Clonts, MD 01/09/14 [phone]

## 2014-01-05 NOTE — ED Notes (Signed)
Pt arrives via ems. 2 hours ago pt reported that she took 100 tylenol, pt says that she was not trying to kill herself she is just depressed over loosing her husband and her kids. VSS, IV established.

## 2014-01-05 NOTE — ED Notes (Signed)
Pt says "I am just so tired of the pain" in her right lower abdomen. Pt says she did not call ems and report taking tylenol. She says her friend [name] called because she would not answer her phone. Pt says that she just had "two small bottles of wine tonight" and denies ingestion of tylenol. Pt denies SI or HI at this time. Pt says she is just "depressed" and "sick and tired of the pain" and "I am ticked off because my husband had an affair, that's my reason I am in here".

## 2014-01-05 NOTE — ED Notes (Signed)
Bed: WA09 Expected date:  Expected time:  Means of arrival:  Comments: EMS 38yo F, overdose on Tylenol, chronic abd pain

## 2014-01-06 ENCOUNTER — Encounter (HOSPITAL_COMMUNITY): Payer: Self-pay | Admitting: *Deleted

## 2014-01-06 ENCOUNTER — Encounter (HOSPITAL_COMMUNITY): Payer: Self-pay | Admitting: Psychiatry

## 2014-01-06 ENCOUNTER — Inpatient Hospital Stay (HOSPITAL_COMMUNITY)
Admission: AD | Admit: 2014-01-06 | Discharge: 2014-01-09 | DRG: 897 | Disposition: A | Discharge status: Home / Self Care | Payer: Federal, State, Local not specified - Other | Source: Intra-hospital | Attending: Psychiatry | Admitting: Psychiatry

## 2014-01-06 DIAGNOSIS — Z833 Family history of diabetes mellitus: Secondary | ICD-10-CM

## 2014-01-06 DIAGNOSIS — G621 Alcoholic polyneuropathy: Secondary | ICD-10-CM

## 2014-01-06 DIAGNOSIS — F1020 Alcohol dependence, uncomplicated: Secondary | ICD-10-CM | POA: Diagnosis present

## 2014-01-06 DIAGNOSIS — F4321 Adjustment disorder with depressed mood: Secondary | ICD-10-CM

## 2014-01-06 DIAGNOSIS — F411 Generalized anxiety disorder: Secondary | ICD-10-CM | POA: Diagnosis present

## 2014-01-06 DIAGNOSIS — T391X2A Poisoning by 4-Aminophenol derivatives, intentional self-harm, initial encounter: Secondary | ICD-10-CM

## 2014-01-06 DIAGNOSIS — F10229 Alcohol dependence with intoxication, unspecified: Secondary | ICD-10-CM

## 2014-01-06 DIAGNOSIS — R109 Unspecified abdominal pain: Secondary | ICD-10-CM | POA: Diagnosis present

## 2014-01-06 DIAGNOSIS — F332 Major depressive disorder, recurrent severe without psychotic features: Secondary | ICD-10-CM | POA: Diagnosis present

## 2014-01-06 DIAGNOSIS — G8929 Other chronic pain: Secondary | ICD-10-CM | POA: Diagnosis present

## 2014-01-06 DIAGNOSIS — F1994 Other psychoactive substance use, unspecified with psychoactive substance-induced mood disorder: Secondary | ICD-10-CM | POA: Diagnosis present

## 2014-01-06 DIAGNOSIS — F329 Major depressive disorder, single episode, unspecified: Secondary | ICD-10-CM

## 2014-01-06 DIAGNOSIS — F1010 Alcohol abuse, uncomplicated: Principal | ICD-10-CM | POA: Diagnosis present

## 2014-01-06 DIAGNOSIS — K7689 Other specified diseases of liver: Secondary | ICD-10-CM | POA: Diagnosis present

## 2014-01-06 DIAGNOSIS — Z823 Family history of stroke: Secondary | ICD-10-CM

## 2014-01-06 DIAGNOSIS — R45851 Suicidal ideations: Secondary | ICD-10-CM

## 2014-01-06 LAB — ETHANOL: ALCOHOL ETHYL (B): 477 mg/dL — AB (ref 0–11)

## 2014-01-06 LAB — RAPID URINE DRUG SCREEN, HOSP PERFORMED
Amphetamines: NOT DETECTED
Barbiturates: NOT DETECTED
Benzodiazepines: NOT DETECTED
Cocaine: NOT DETECTED
OPIATES: NOT DETECTED
Tetrahydrocannabinol: NOT DETECTED

## 2014-01-06 LAB — ACETAMINOPHEN LEVEL
Acetaminophen (Tylenol), Serum: <15 ug/mL (ref 10–30)
Acetaminophen (Tylenol), Serum: <15 ug/mL (ref 10–30)

## 2014-01-06 LAB — COMPREHENSIVE METABOLIC PANEL
ALBUMIN: 3.5 g/dL (ref 3.5–5.2)
ALT: 55 U/L — ABNORMAL HIGH (ref 0–35)
AST: 243 U/L — ABNORMAL HIGH (ref 0–37)
Alkaline Phosphatase: 157 U/L — ABNORMAL HIGH (ref 39–117)
BILIRUBIN TOTAL: 0.4 mg/dL (ref 0.3–1.2)
BUN: 6 mg/dL (ref 6–23)
CO2: 24 meq/L (ref 19–32)
CREATININE: 0.42 mg/dL — AB (ref 0.50–1.10)
Calcium: 8.3 mg/dL — ABNORMAL LOW (ref 8.4–10.5)
Chloride: 104 mEq/L (ref 96–112)
GLUCOSE: 117 mg/dL — AB (ref 70–99)
POTASSIUM: 4 meq/L (ref 3.7–5.3)
Sodium: 142 mEq/L (ref 137–147)
Total Protein: 6.4 g/dL (ref 6.0–8.3)

## 2014-01-06 LAB — MAGNESIUM: Magnesium: 1.8 mg/dL (ref 1.5–2.5)

## 2014-01-06 LAB — CBC WITH DIFFERENTIAL/PLATELET
BASOS ABS: 0 10*3/uL (ref 0.0–0.1)
Basophils Relative: 1 % (ref 0–1)
Eosinophils Absolute: 0 10*3/uL (ref 0.0–0.7)
Eosinophils Relative: 0 % (ref 0–5)
HCT: 30.7 % — ABNORMAL LOW (ref 36.0–46.0)
Hemoglobin: 9.9 g/dL — ABNORMAL LOW (ref 12.0–15.0)
LYMPHS ABS: 0.9 10*3/uL (ref 0.7–4.0)
Lymphocytes Relative: 50 % — ABNORMAL HIGH (ref 12–46)
MCH: 30.6 pg (ref 26.0–34.0)
MCHC: 32.2 g/dL (ref 30.0–36.0)
MCV: 94.8 fL (ref 78.0–100.0)
MONOS PCT: 15 % — AB (ref 3–12)
Monocytes Absolute: 0.3 10*3/uL (ref 0.1–1.0)
NEUTROS PCT: 34 % — AB (ref 43–77)
Neutro Abs: 0.6 10*3/uL — ABNORMAL LOW (ref 1.7–7.7)
Platelets: 147 10*3/uL — ABNORMAL LOW (ref 150–400)
RBC: 3.24 MIL/uL — AB (ref 3.87–5.11)
RDW: 15.7 % — AB (ref 11.5–15.5)
WBC: 1.8 10*3/uL — ABNORMAL LOW (ref 4.0–10.5)

## 2014-01-06 LAB — HEPATIC FUNCTION PANEL
ALK PHOS: 141 U/L — AB (ref 39–117)
ALT: 47 U/L — ABNORMAL HIGH (ref 0–35)
AST: 198 U/L — ABNORMAL HIGH (ref 0–37)
Albumin: 3.4 g/dL — ABNORMAL LOW (ref 3.5–5.2)
Bilirubin, Direct: <0.2 mg/dL (ref 0.0–0.3)
Total Bilirubin: 0.4 mg/dL (ref 0.3–1.2)
Total Protein: 5.9 g/dL — ABNORMAL LOW (ref 6.0–8.3)

## 2014-01-06 LAB — URINALYSIS, ROUTINE W REFLEX MICROSCOPIC
Bilirubin Urine: NEGATIVE
GLUCOSE, UA: NEGATIVE mg/dL
Hgb urine dipstick: NEGATIVE
Ketones, ur: NEGATIVE mg/dL
LEUKOCYTES UA: NEGATIVE
NITRITE: NEGATIVE
PH: 7 (ref 5.0–8.0)
Protein, ur: NEGATIVE mg/dL
SPECIFIC GRAVITY, URINE: 1.012 (ref 1.005–1.030)
Urobilinogen, UA: 0.2 mg/dL (ref 0.0–1.0)

## 2014-01-06 LAB — BASIC METABOLIC PANEL
BUN: 8 mg/dL (ref 6–23)
CALCIUM: 8.6 mg/dL (ref 8.4–10.5)
CO2: 24 meq/L (ref 19–32)
Chloride: 102 mEq/L (ref 96–112)
Creatinine, Ser: 0.43 mg/dL — ABNORMAL LOW (ref 0.50–1.10)
GFR calc Af Amer: >90 mL/min (ref >90)
GLUCOSE: 106 mg/dL — AB (ref 70–99)
Potassium: 4.4 mEq/L (ref 3.7–5.3)
SODIUM: 142 meq/L (ref 137–147)

## 2014-01-06 MED ORDER — FOLIC ACID 1 MG PO TABS
1.0000 mg | ORAL_TABLET | Freq: Every day | ORAL | Status: DC
  Administered 2014-01-06 – 2014-01-09 (×4): 1 mg via ORAL
  Filled 2014-01-06 (×7): qty 1

## 2014-01-06 MED ORDER — FOLIC ACID 1 MG PO TABS
1.0000 mg | ORAL_TABLET | Freq: Every day | ORAL | Status: DC
  Administered 2014-01-06: 1 mg via ORAL
  Filled 2014-01-06: qty 1

## 2014-01-06 MED ORDER — FLUOXETINE HCL 20 MG PO CAPS
20.0000 mg | ORAL_CAPSULE | Freq: Every day | ORAL | Status: DC
  Administered 2014-01-06 – 2014-01-09 (×4): 20 mg via ORAL
  Filled 2014-01-06 (×4): qty 1
  Filled 2014-01-06: qty 14
  Filled 2014-01-06 (×2): qty 1

## 2014-01-06 MED ORDER — LORAZEPAM 1 MG PO TABS
0.0000 mg | ORAL_TABLET | Freq: Two times a day (BID) | ORAL | Status: DC
  Administered 2014-01-09: 1 mg via ORAL
  Filled 2014-01-06: qty 1

## 2014-01-06 MED ORDER — ADULT MULTIVITAMIN W/MINERALS CH
1.0000 | ORAL_TABLET | Freq: Every day | ORAL | Status: DC
  Administered 2014-01-06 – 2014-01-09 (×4): 1 via ORAL
  Filled 2014-01-06 (×7): qty 1

## 2014-01-06 MED ORDER — LORAZEPAM 1 MG PO TABS
0.0000 mg | ORAL_TABLET | Freq: Four times a day (QID) | ORAL | Status: AC
  Administered 2014-01-06: 2 mg via ORAL
  Administered 2014-01-07 – 2014-01-08 (×7): 1 mg via ORAL
  Filled 2014-01-06 (×3): qty 1
  Filled 2014-01-06: qty 2
  Filled 2014-01-06 (×5): qty 1

## 2014-01-06 MED ORDER — IBUPROFEN 200 MG PO TABS
400.0000 mg | ORAL_TABLET | ORAL | Status: DC | PRN
  Administered 2014-01-06: 400 mg via ORAL
  Filled 2014-01-06: qty 2

## 2014-01-06 MED ORDER — ADULT MULTIVITAMIN W/MINERALS CH
1.0000 | ORAL_TABLET | Freq: Every day | ORAL | Status: DC
Start: 2014-01-06 — End: 2014-01-06
  Administered 2014-01-06: 1 via ORAL
  Filled 2014-01-06: qty 1

## 2014-01-06 MED ORDER — LORAZEPAM 1 MG PO TABS
1.0000 mg | ORAL_TABLET | Freq: Three times a day (TID) | ORAL | Status: DC | PRN
  Administered 2014-01-06: 1 mg via ORAL
  Filled 2014-01-06: qty 1

## 2014-01-06 MED ORDER — LORAZEPAM 1 MG PO TABS
0.0000 mg | ORAL_TABLET | Freq: Four times a day (QID) | ORAL | Status: DC
  Administered 2014-01-06: 1 mg via ORAL
  Filled 2014-01-06: qty 1

## 2014-01-06 MED ORDER — MAGNESIUM HYDROXIDE 400 MG/5ML PO SUSP: 30.0000 mL | Freq: Every day | ORAL | Status: DC | PRN

## 2014-01-06 MED ORDER — ONDANSETRON 4 MG PO TBDP
4.0000 mg | ORAL_TABLET | Freq: Four times a day (QID) | ORAL | Status: DC | PRN
  Administered 2014-01-06: 4 mg via ORAL
  Filled 2014-01-06: qty 1

## 2014-01-06 MED ORDER — TRAZODONE HCL 50 MG PO TABS
25.0000 mg | ORAL_TABLET | Freq: Every evening | ORAL | Status: DC | PRN
  Administered 2014-01-07 – 2014-01-08 (×2): 25 mg via ORAL
  Filled 2014-01-06: qty 7
  Filled 2014-01-06: qty 1

## 2014-01-06 MED ORDER — VITAMIN B-1 100 MG PO TABS
100.0000 mg | ORAL_TABLET | Freq: Every day | ORAL | Status: DC
  Administered 2014-01-06: 100 mg via ORAL
  Filled 2014-01-06: qty 1

## 2014-01-06 MED ORDER — ALUM & MAG HYDROXIDE-SIMETH 200-200-20 MG/5ML PO SUSP: 30.0000 mL | ORAL | Status: DC | PRN

## 2014-01-06 MED ORDER — LORAZEPAM 1 MG PO TABS: 0.0000 mg | ORAL_TABLET | Freq: Two times a day (BID) | ORAL | Status: DC

## 2014-01-06 MED ORDER — VITAMIN B-1 100 MG PO TABS
100.0000 mg | ORAL_TABLET | Freq: Every day | ORAL | Status: DC
  Administered 2014-01-07 – 2014-01-09 (×3): 100 mg via ORAL
  Filled 2014-01-06 (×6): qty 1

## 2014-01-06 MED ORDER — LORAZEPAM 2 MG/ML IJ SOLN
INTRAMUSCULAR | Status: AC
  Filled 2014-01-06: qty 1

## 2014-01-06 MED ORDER — ACETAMINOPHEN 325 MG PO TABS: 650.0000 mg | ORAL_TABLET | Freq: Four times a day (QID) | ORAL | Status: DC | PRN

## 2014-01-06 MED ORDER — LORAZEPAM 1 MG PO TABS
1.0000 mg | ORAL_TABLET | Freq: Three times a day (TID) | ORAL | Status: DC | PRN
  Administered 2014-01-06 – 2014-01-08 (×3): 1 mg via ORAL
  Filled 2014-01-06 (×2): qty 1

## 2014-01-06 MED ORDER — THIAMINE HCL 100 MG/ML IJ SOLN: 100.0000 mg | Freq: Every day | INTRAMUSCULAR | Status: DC

## 2014-01-06 MED ORDER — VITAMIN B-1 100 MG PO TABS: 100.0000 mg | ORAL_TABLET | Freq: Every day | ORAL | Status: DC

## 2014-01-06 MED ORDER — ONDANSETRON 4 MG PO TBDP
4.0000 mg | ORAL_TABLET | Freq: Four times a day (QID) | ORAL | Status: DC | PRN
  Administered 2014-01-06 – 2014-01-07 (×2): 4 mg via ORAL
  Filled 2014-01-06 (×2): qty 1

## 2014-01-06 MED ORDER — THIAMINE HCL 100 MG/ML IJ SOLN
100.0000 mg | Freq: Every day | INTRAMUSCULAR | Status: DC
  Administered 2014-01-06: 100 mg via INTRAMUSCULAR
  Filled 2014-01-06: qty 2

## 2014-01-06 NOTE — ED Notes (Signed)
Nausea with intermittent vomiting. Will give Zofran and Ativan.

## 2014-01-06 NOTE — BHH Counselor (Signed)
Per Gust Rung, NP, PT will be evaluated by psychiatry.

## 2014-01-06 NOTE — ED Notes (Signed)
Pt is resting quietly,  Pt is on cardiac monitor,

## 2014-01-06 NOTE — ED Notes (Signed)
EDP Savitz notified of pt ETOH level

## 2014-01-06 NOTE — ED Notes (Signed)
Spoke with Kristen Roberson at [facility] control center who does recommend that we check LFT with tylenol draw at 0330. Does not recommend mucomyst at this time. Dr. Reather Converse updated on plan

## 2014-01-06 NOTE — ED Notes (Signed)
Notified lab of orders pending....BMP and acetaminophen level

## 2014-01-06 NOTE — ED Notes (Signed)
TTS consult in progress. °
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
## 2014-01-06 NOTE — BHH Group Notes (Signed)
[facility] Group Notes:  (Clinical Social Work)  01/06/2014   1:15-2:15PM  Summary of Progress/Problems:   The main focus of today's process group was for the patient to identify ways in which they have sabotaged their own mental health wellness/recovery.  Motivational interviewing and a handout were used to explore the benefits and costs of their self-sabotaging behavior as well as the benefits and costs of changing this behavior.  The Stages of Change were explained to the group using a handout, and patients identified where they are with regard to changing self-defeating behaviors.  The patient expressed she self-sabotages with alcohol, which she uses to numb her pain.  She stated she has moved here from [place] to start a new life away from her husband who left after 17 years of marriage to move in with his lover.  She stated her motivation currently to change is 0 on a scale of 1-10.  To move her motivation higher, she would have to start running again and get a new job.  Type of Therapy:  Process Group  Participation Level:  Active  Participation Quality:  Attentive  Affect:  Depressed and Flat  Cognitive:  Alert  Insight:  Improving  Engagement in Therapy:  Engaged  Modes of Intervention:  Education, Motivational Interviewing   Selmer Dominion, LCSW 01/06/2014, 4:00pm

## 2014-01-06 NOTE — Progress Notes (Signed)
Patient admitted to Adult 300. Recently found out that her husband who is in the [place] was seeing another woman. She was made aware of this by her 38 year old son. Patient has been losing weight and has lost 42 lbs in last 2 months. . Recently had her ovaries and appendix removed and is in pain chronically.  Has been drinking 3 or more drinks nightly. Denies HI, AVH. Denied SI but told someone in the ED that she had taken 100 tylenol. Tox screen did not support this.  Oriented to unit. Nutrition offered. Education provided regarding safety and falls. Safety checks started every 15 minutes.

## 2014-01-06 NOTE — BH Assessment (Signed)
Spoke with Dr. Reather Converse who stated that pt has a history of depression . Pt reported that she took 100 Tylenol prior to coming to the ED. Pt's BAL is 477. Dr. Reather Converse will contact TTS for an assessment once pt is medically cleared and poison control has been contacted.
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
## 2014-01-06 NOTE — ED Notes (Signed)
Per poison control-appears stable and they are closing her case.

## 2014-01-06 NOTE — BH Assessment (Signed)
Assessment completed. Consulted Kristen Colonel, NP who recommended inpatient treatment. Pt has been accepted to [facility] Room 300 Bed 1. Dr. Reather Converse has been notified of recommendations and acceptance to [facility].

## 2014-01-06 NOTE — ED Notes (Signed)
Called to [employer] at [facility]. Accepted by Dr Sabra Heck to room 300-1.

## 2014-01-06 NOTE — ED Notes (Signed)
[facility] notified of transportation needed to [facility].

## 2014-01-06 NOTE — ED Notes (Signed)
[facility] here to transport to [facility]. Rates pain at 7/10. No complaints voiced. Ambulatory without difficulty. Belongings bag x1 given to driver.

## 2014-01-07 DIAGNOSIS — F1994 Other psychoactive substance use, unspecified with psychoactive substance-induced mood disorder: Secondary | ICD-10-CM

## 2014-01-07 DIAGNOSIS — F332 Major depressive disorder, recurrent severe without psychotic features: Secondary | ICD-10-CM

## 2014-01-07 DIAGNOSIS — F1010 Alcohol abuse, uncomplicated: Principal | ICD-10-CM

## 2014-01-07 MED ORDER — DICYCLOMINE HCL 10 MG PO CAPS
10.0000 mg | ORAL_CAPSULE | Freq: Three times a day (TID) | ORAL | Status: DC | PRN
  Administered 2014-01-07: 10 mg via ORAL
  Filled 2014-01-07 (×2): qty 1

## 2014-01-07 MED ORDER — BOOST / RESOURCE BREEZE PO LIQD
1.0000 | Freq: Three times a day (TID) | ORAL | Status: DC
  Administered 2014-01-07 – 2014-01-08 (×3): 1 via ORAL
  Administered 2014-01-08: 0.9875 via ORAL
  Administered 2014-01-09: 1 via ORAL
  Filled 2014-01-07 (×12): qty 1

## 2014-01-07 NOTE — Progress Notes (Signed)
D: Pt denies SI/HI/AVH. Pt is pleasant and cooperative. Pt in bed the while night.   A: Pt was offered support and encouragement. Pt was given scheduled medications. Pt was encourage to attend groups. Q 15 minute checks were done for safety.   R:ts.Pt receptive to treatment and safety maintained on unit.
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
## 2014-01-07 NOTE — Progress Notes (Signed)
NUTRITION ASSESSMENT  Pt identified as at risk on the Malnutrition Screen Tool  INTERVENTION: 1. Educated patient on the importance of nutrition and encouraged intake of food and beverages. 2. Discussed weight goals. 3. Supplements: MVI, thiamine, and folic acid daily.  Resource Breeze tid.  NUTRITION DIAGNOSIS: Unintentional weight loss related to sub-optimal intake as evidenced by pt report.   Goal: Pt to meet >/= 90% of their estimated nutrition needs.  Monitor:  PO intake  Assessment:  Patient admitted with etoh abuse and major depression.  Last seen by this RD as inpatient 12/10/13.  Patient diagnosed with moderate malnutrition in the context of social or environmental circumstances AEB a decrease of body fat and muscle mass along with intake <75% of estimated needs for >/= 3 months.  Patient with an 8 lb weight loss in the past month (7%).  Low folic acid.  Patient reports she just "picks a lot".  Appetite is still poor. Discussed good nutrition and patient states that when she eats well she feels less like drinking.  Has [employer] in her kitchen but has not been using.  Dislikes Ensure.  38 y.o. female  Height: Ht Readings from Last 1 Encounters:  01/06/14 5\' 6"  (1.676 m)    Weight: Wt Readings from Last 1 Encounters:  01/06/14 112 lb (50.803 kg)    Weight Hx: Wt Readings from Last 10 Encounters:  01/06/14 112 lb (50.803 kg)  01/05/14 105 lb (47.628 kg)  12/09/13 120 lb (54.432 kg)  11/30/13 120 lb (54.432 kg)  11/28/13 120 lb (54.432 kg)  11/25/13 123 lb 4.8 oz (55.929 kg)  11/24/13 134 lb 0.6 oz (60.8 kg)  11/24/13 134 lb 0.6 oz (60.8 kg)  10/29/13 129 lb 3 oz (58.6 kg)  09/28/13 120 lb (54.432 kg)    BMI:  Body mass index is 18.09 kg/(m^2). Pt meets criteria for underweight based on current BMI.  Estimated Nutritional Needs: Kcal: 25-30 kcal/kg Protein: > 1 gram protein/kg Fluid: 1 ml/kcal  Diet Order: General Pt is also offered  choice of unit snacks mid-morning and mid-afternoon.  Pt is eating as desired.   Lab results and medications reviewed.   Antonieta Iba, RD, LDN Clinical Inpatient Dietitian Pager:  [phone] Weekend and after hours pager:  [phone]
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
## 2014-01-07 NOTE — Progress Notes (Signed)
D) Pt has been attending the groups and interacting with her peers. Mood and affect are appropriate. Pt denies SI and HI. Rates her depression at a 6 and her hopelessness at a 5. Pt feels that she needs more support systems in her life. Has also gone to her room and rested in between the groups. Denies withdrawal symptoms. A) Given support, reassurance and praise. Encouragement given. Educated Pt about the detox protocol and when she will start to feel better. R) Denies SI and HI.
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
## 2014-01-07 NOTE — BHH Group Notes (Signed)
[facility] Group Notes:  (Clinical Social Work)  01/07/2014   1:15-2:15PM  Summary of Progress/Problems:  The main focus of today's process group was to   identify the patient's current support system and decide on other supports that can be put in place.  The picture on workbook was used to discuss why additional supports are needed.  An emphasis was placed on using counselor, doctor, therapy groups, 12-step groups, and problem-specific support groups to expand supports.   There was also an extensive discussion about what constitutes a healthy support versus an unhealthy support.  The patient expressed full comprehension of the concepts presented, and agreed that there is a need to add more supports.  The patient stated one additional support s/he would be willing to add is a therapist as well as getting connected with her AA group more actively again, as much as once a day like she used to do.  She als0 is committed to staying on her medications.  Type of Therapy:  Process Group  Participation Level:  Active  Participation Quality:  Attentive and Sharing  Affect:  Not congruent  Cognitive:  Appropriate and Oriented  Insight:  Improving  Engagement in Therapy:  Engaged  Modes of Intervention:  Education,  Support and [employer], LCSW 01/07/2014, 4:00pm

## 2014-01-08 DIAGNOSIS — F1020 Alcohol dependence, uncomplicated: Secondary | ICD-10-CM

## 2014-01-08 DIAGNOSIS — F329 Major depressive disorder, single episode, unspecified: Secondary | ICD-10-CM

## 2014-01-08 MED ORDER — ACAMPROSATE CALCIUM 333 MG PO TBEC
666.0000 mg | DELAYED_RELEASE_TABLET | Freq: Three times a day (TID) | ORAL | Status: DC
  Administered 2014-01-08 – 2014-01-09 (×4): 666 mg via ORAL
  Filled 2014-01-08 (×2): qty 2
  Filled 2014-01-08: qty 84
  Filled 2014-01-08 (×2): qty 2
  Filled 2014-01-08: qty 84
  Filled 2014-01-08 (×2): qty 2
  Filled 2014-01-08: qty 84

## 2014-01-08 NOTE — Progress Notes (Signed)
Adult Psychoeducational Group Note  Date:  01/08/2014 Time:  9:11 PM  Group Topic/Focus:  Wrap-Up Group:   The focus of this group is to help patients review their daily goal of treatment and discuss progress on daily workbooks.  Participation Level:  None  Participation Quality:  Did Not Participate  Affect:  Did Not Participate  Cognitive:  Did Not Participate  Insight: None  Engagement in Group:  Did Not Participate  Modes of Intervention:  Did Not Participate  Additional Comments:    Moshe Cipro 01/08/2014, 9:11 PM

## 2014-01-08 NOTE — Progress Notes (Addendum)
[facility] MD Progress Note  01/08/2014 10:08 AM Kristen Roberson  MRN:  [id number] Subjective: " " I am feeling better" Objective: Patient states that she is doing better, although still depressed, ruminative about he significant psychosocial stressors. She reports she recently found out her husband was being unfaithful so left him. She has been drinking heavily and daily, and admits she has an " alcohol problem" and that there is a significant history of alcohol dependence in her family. She reports she is still depressed but better than she was. She is future oriented and states she hopes to get a job locally so that she can become more independent financially and  Feel better about her situation. Of note her children are currently with the father's family in [place].. She is not presenting with any severe WDL currently. No gross tremors, no diaphoresis, no agitation or restlessness. She does remain slightly tachycardic. No disruptive behaviors on unit. Participative in milieu.  Diagnosis:    Total Time spent with patient: 20 minutes  MDD , Alcohol Dependence  ADL's: improved   Sleep: good   Appetite:  Good   Suicidal Ideation:  Denies any suicidal ideations Homicidal Ideation:  Denies any homicidal ideations- specifically also denies any thoughts of hurting her husband .  AEB (as evidenced by):  Psychiatric Specialty Exam: Physical Exam  Review of Systems  Constitutional: Positive for weight loss. Negative for fever, chills and diaphoresis.       Has lost several pounds over recent weeks  Respiratory: Negative for cough and shortness of breath.   Cardiovascular: Negative for chest pain.  Gastrointestinal: Negative for nausea and vomiting.  Psychiatric/Behavioral: Positive for depression and substance abuse. Negative for hallucinations.    Blood pressure 124/91, pulse 100, temperature 96.8 F (36 C), temperature source Oral, resp. rate 16, height 5' 6"  (1.676 m), weight 50.803 kg (112  lb), last menstrual period 09/25/2013, SpO2 97.00%.Body mass index is 18.09 kg/(m^2).  General Appearance: improved grooming   Eye Contact::  Good  Speech:  Clear and Coherent  Volume:  Normal  Mood:  Anxious and Depressed  Affect:  Appropriate and Congruent  Thought Process:  Linear  Orientation:  Full (Time, Place, and Person)  Thought Content:  denies hallucinations, no delusions  Suicidal Thoughts:  Denies any suicidal ideations  Homicidal Thoughts:  Denies any homicidal ideations- specifically denies any thoughts of hurting her husband   Memory:  NA  Judgement:  Fair  Insight:  Fair  Psychomotor Activity:  Normal  Concentration:  Good  Recall:  Good  Fund of Knowledge:Good  Language: Good  Akathisia:  Negative  Handed:  Right  AIMS (if indicated):     Assets:  Communication Skills Desire for Improvement Resilience  Sleep:  Number of Hours: 6.5   Musculoskeletal: Strength & Muscle Tone: within normal limits- no significant tremors noted at this time.  Gait & Station: normal Patient leans: N/A  Current Medications: Current Facility-Administered Medications  Medication Dose Route Frequency Provider Last Rate Last Dose  . acetaminophen (TYLENOL) tablet 650 mg  650 mg Oral Q6H PRN Lurena Nida, NP      . alum & mag hydroxide-simeth (MAALOX/MYLANTA) 200-200-20 MG/5ML suspension 30 mL  30 mL Oral Q4H PRN Lurena Nida, NP      . dicyclomine (BENTYL) capsule 10 mg  10 mg Oral TID PRN Benjamine Mola, FNP   10 mg at 01/07/14 2010  . feeding supplement (RESOURCE BREEZE) (RESOURCE BREEZE) liquid 1 Container  1  Container Oral TID BM Darrol Jump, RD   1 Container at 01/08/14 [phone]  . FLUoxetine (PROZAC) capsule 20 mg  20 mg Oral Daily Waylan Boga, NP   20 mg at 01/08/14 0804  . folic acid (FOLVITE) tablet 1 mg  1 mg Oral Daily Lurena Nida, NP   1 mg at 01/08/14 0804  . LORazepam (ATIVAN) tablet 0-4 mg  0-4 mg Oral 4 times per day Lurena Nida, NP   1 mg at 01/08/14 7510    Followed by  . LORazepam (ATIVAN) tablet 0-4 mg  0-4 mg Oral Q12H Lurena Nida, NP      . LORazepam (ATIVAN) tablet 1 mg  1 mg Oral Q8H PRN Lurena Nida, NP   1 mg at 01/07/14 1545  . magnesium hydroxide (MILK OF MAGNESIA) suspension 30 mL  30 mL Oral Daily PRN Lurena Nida, NP      . multivitamin with minerals tablet 1 tablet  1 tablet Oral Daily Lurena Nida, NP   1 tablet at 01/08/14 0804  . ondansetron (ZOFRAN-ODT) disintegrating tablet 4 mg  4 mg Oral Q6H PRN Lurena Nida, NP   4 mg at 01/07/14 0657  . thiamine (VITAMIN B-1) tablet 100 mg  100 mg Oral Daily Lurena Nida, NP   100 mg at 01/08/14 0805   Or  . thiamine (B-1) injection 100 mg  100 mg Intramuscular Daily Lurena Nida, NP   100 mg at 01/06/14 1849  . traZODone (DESYREL) tablet 25 mg  25 mg Oral QHS PRN Lurena Nida, NP   25 mg at 01/07/14 2302    Lab Results:  Results for orders placed during the hospital encounter of 01/05/14 (from the past 48 hour(s))  ACETAMINOPHEN LEVEL     Status: None   Collection Time    01/06/14 11:40 AM      Result Value Ref Range   Acetaminophen (Tylenol), Serum <15.0  10 - 30 ug/mL   Comment:            THERAPEUTIC CONCENTRATIONS VARY     SIGNIFICANTLY. A RANGE OF 10-30     ug/mL MAY BE AN EFFECTIVE     CONCENTRATION FOR MANY PATIENTS.     HOWEVER, SOME ARE BEST TREATED     AT CONCENTRATIONS OUTSIDE THIS     RANGE.     ACETAMINOPHEN CONCENTRATIONS     >150 ug/mL AT 4 HOURS AFTER     INGESTION AND >50 ug/mL AT 12     HOURS AFTER INGESTION ARE     OFTEN ASSOCIATED WITH TOXIC     REACTIONS.  BASIC METABOLIC PANEL     Status: Abnormal   Collection Time    01/06/14 11:40 AM      Result Value Ref Range   Sodium 142  137 - 147 mEq/L   Potassium 4.4  3.7 - 5.3 mEq/L   Chloride 102  96 - 112 mEq/L   CO2 24  19 - 32 mEq/L   Glucose, Bld 106 (*) 70 - 99 mg/dL   BUN 8  6 - 23 mg/dL   Creatinine, Ser 0.43 (*) 0.50 - 1.10 mg/dL   Calcium 8.6  8.4 - 10.5 mg/dL   GFR calc non Af Amer  >90  >90 mL/min   GFR calc Af Amer >90  >90 mL/min   Comment: (NOTE)     The eGFR has been calculated using the CKD EPI equation.  This calculation has not been validated in all clinical situations.     eGFR's persistently <90 mL/min signify possible Chronic Kidney     Disease.    Physical Findings: AIMS: Facial and Oral Movements Muscles of Facial Expression: None, normal Lips and Perioral Area: None, normal Jaw: None, normal Tongue: None, normal,Extremity Movements Upper (arms, wrists, hands, fingers): None, normal Lower (legs, knees, ankles, toes): None, normal, Trunk Movements Neck, shoulders, hips: None, normal, Overall Severity Severity of abnormal movements (highest score from questions above): None, normal Incapacitation due to abnormal movements: None, normal Patient's awareness of abnormal movements (rate only patient's report): No Awareness, Dental Status Current problems with teeth and/or dentures?: No Does patient usually wear dentures?: No  CIWA:  CIWA-Ar Total: 2 COWS:     Assessment:  Patient has a history of depression and alcohol dependence. She is facing significant stressors, primarily separation from husband due to alleged infidelity and being away from her children. She is depressed, anxious, but currently not suicidal or psychotic. No significant WDL symptoms noted at this time but she is mildly tachycardic. Tolerating Prozac well at this time.  Treatment Plan Summary: Daily contact with patient to assess and evaluate symptoms and progress in treatment Medication management As below   Plan: recheck CBC , and a TSH . Continue Prozac at current doses .  Agrees to CAMPRAL trial to assist in efforts to maintain sobriety- we discussed side effects and rationale.   Medical Decision Making Problem Points:  Established problem, stable/improving (1) Data Points:  Review or order clinical lab tests (1) Review of new medications or change in dosage (2)  I  certify that inpatient services furnished can reasonably be expected to improve the patient's condition.   COBOS, [place] 01/08/2014, 10:08 AM

## 2014-01-08 NOTE — Progress Notes (Signed)
D:  Patient's self inventory, patient has poor sleep, improving appetite, normal energy level, good attention span.  Rated depression 4, hopeless 3, anxiety 6.  Denied withdrawals.  Worst pain in past 24 hours is #5, pain goal #3.  Plans to go to AA, get a job, go to therapist, no alcohol, eat healthier.   Does have discharge plans.  Needs financial assistance with meds after discharge, no insurance. A:  Medications administered per MD orders.  Emotional support and encouragement given pt. R:  Denied SI and HI, contracts for safety.  Denied A/V hallucinations.  Will continue to monitor patient for safety with 15 minute checks.  Safety maintained.  Patient will discuss medications with MD, feels her sleep medication Trazadone needs to be increased.

## 2014-01-08 NOTE — BHH Group Notes (Signed)
[facility] LCSW Aftercare Discharge Planning Group Note   01/08/2014 8:45 AM  Participation Quality:  Alert, Appropriate and Oriented  Mood/Affect:  Calm  Depression Rating:  4  Anxiety Rating:  4  Thoughts of Suicide:  Pt denies SI/HI  Will you contract for safety?   Yes  Current AVH:  Pt denies  Plan for Discharge/Comments:  Pt attended discharge planning group and actively participated in group.  CSW provided pt with today's workbook.  Pt reports feeling well today and is hopeful to d/c soon.  Pt is worried about a job interview she states she has tomorrow at 4:30 pm, which she states is very important for her.  Pt states that she recently moved from [place] to [place].  Pt has follow up scheduled at [facility] for outpatient medication management and therapy.  No further needs voiced by pt at this time.    Transportation Means: Pt reports access to transportation - friend will pick pt up  Supports: No supports mentioned at this time  Regan Lemming, [place] 01/08/2014 11:05 AM

## 2014-01-08 NOTE — BHH Group Notes (Signed)
[facility] LCSW Group Therapy  01/08/2014   1:15 PM   Type of Therapy:  Group Therapy  Participation Level:  Active  Participation Quality:  Attentive, Sharing and Supportive  Affect: Calm  Cognitive:  Alert and Oriented  Insight:  Developing/Improving and Engaged  Engagement in Therapy:  Developing/Improving and Engaged  Modes of Intervention:  Clarification, Confrontation, Discussion, Education, Exploration, Limit-setting, Orientation, Problem-solving, Rapport Building, Art therapist, Socialization and Support  Summary of Progress/Problems: Pt identified obstacles faced currently and processed barriers involved in overcoming these obstacles. Pt identified steps necessary for overcoming these obstacles and explored motivation (internal and external) for facing these difficulties head on. Pt further identified one area of concern in their lives and chose a goal to focus on for today. Pt discussed her biggest obstacle as getting back on her feet.  Pt states that she was drinking again, going through a divorce and battling for custody of her children.  Pt states that she plans to attend AA meetings, get a job and focus on getting herself together in order to be there for her children and take care of everything.  Pt was insightful and realistic with her goals and plans.  Pt actively participated and was engaged in group discussion.    Regan Lemming, LCSW 01/08/2014  3:09 PM

## 2014-01-08 NOTE — Progress Notes (Signed)
Patient ID: Kristen Roberson, female   DOB: May 18, 1976, 38 y.o.   MRN: [id number]  CSW spoke with Pt's friend, Lenice Pressman, [phone], about Pt's substance use and discharge plan.  Mr. Heloise Purpura reported that he had "connections" at [employer] and CSW encouraged Mr. Heloise Purpura to speak with the patient about that possibility.  Mr. Heloise Purpura also reports that the patient will "talk her way out of anything" and that he is concerned about her romantic relationship with another patient on the adult unit.  CSW will follow-up with patient to encourage long-term substance abuse treatment.    Peri Maris, [place] 01/08/2014 12:34 PM

## 2014-01-08 NOTE — Progress Notes (Signed)
D: Pt denies SI/HI/AVH. Pt is pleasant and cooperative. Pt up and more visible on milieu tonight. Pt concerned because she starts a new job on Tue. Pt withdrawal sx greatly reduced.   A: Pt was offered support and encouragement. Pt was given scheduled medications. Pt was encourage to attend groups. Q 15 minute checks were done for safety.   R:Pt attends groups and interacts well with peers and staff. Pt is taking medication. Pt has no complaints at this time.Pt receptive to treatment and safety maintained on unit.

## 2014-01-08 NOTE — Progress Notes (Signed)
Adult Psychoeducational Group Note  Date:  01/08/2014 Time:  4:37 PM  Group Topic/Focus:  Dimensions of Wellness:   The focus of this group is to introduce the topic of wellness and discuss the role each dimension of wellness plays in total health.  Participation Level:  Active  Participation Quality:  Appropriate, Sharing and Supportive  Affect:  Depressed and calm  Cognitive:  Alert and Appropriate  Insight: Good and Improving  Engagement in Group:  Engaged  Modes of Intervention:  Discussion  Additional Comments:  Pt joined in the discussion about goals. She needs to set financial, and  emotional goals to help deal with the end of her 47 year marriage.  Gunnar Bulla 01/08/2014, 4:37 PM

## 2014-01-08 NOTE — BHH Suicide Risk Assessment (Signed)
[facility] INPATIENT:  Family/Significant Other Suicide Prevention Education  Suicide Prevention Education:  Education Completed; Lenice Pressman, friend, [phone], has been identified by the patient as the family member/significant other with whom the patient will be residing, and identified as the person(s) who will aid the patient in the event of a mental health crisis (suicidal ideations/suicide attempt).  With written consent from the patient, the family member/significant other has been provided the following suicide prevention education, prior to the and/or following the discharge of the patient.  The suicide prevention education provided includes the following:  Suicide risk factors  Suicide prevention and interventions  National Suicide Hotline telephone number  [facility] assessment telephone number  [facility] Emergency Assistance [place] and/or Residential Mobile Crisis Unit telephone number  Request made of family/significant other to:  Remove weapons (e.g., guns, rifles, knives), all items previously/currently identified as safety concern.    Remove drugs/medications (over-the-counter, prescriptions, illicit drugs), all items previously/currently identified as a safety concern.  The family member/significant other verbalizes understanding of the suicide prevention education information provided.  The family member/significant other agrees to remove the items of safety concern listed above.  Bo Mcclintock 01/08/2014, 12:27 PM

## 2014-01-09 LAB — CBC WITH DIFFERENTIAL/PLATELET
BASOS ABS: 0 10*3/uL (ref 0.0–0.1)
Basophils Relative: 1 % (ref 0–1)
EOS ABS: 0.1 10*3/uL (ref 0.0–0.7)
EOS PCT: 3 % (ref 0–5)
HCT: 32.3 % — ABNORMAL LOW (ref 36.0–46.0)
Hemoglobin: 10.5 g/dL — ABNORMAL LOW (ref 12.0–15.0)
LYMPHS ABS: 1.1 10*3/uL (ref 0.7–4.0)
LYMPHS PCT: 33 % (ref 12–46)
MCH: 30.4 pg (ref 26.0–34.0)
MCHC: 32.5 g/dL (ref 30.0–36.0)
MCV: 93.6 fL (ref 78.0–100.0)
Monocytes Absolute: 0.3 10*3/uL (ref 0.1–1.0)
Monocytes Relative: 9 % (ref 3–12)
Neutro Abs: 1.8 10*3/uL (ref 1.7–7.7)
Neutrophils Relative %: 54 % (ref 43–77)
PLATELETS: 175 10*3/uL (ref 150–400)
RBC: 3.45 MIL/uL — ABNORMAL LOW (ref 3.87–5.11)
RDW: 15.2 % (ref 11.5–15.5)
WBC: 3.2 10*3/uL — ABNORMAL LOW (ref 4.0–10.5)

## 2014-01-09 LAB — TSH: TSH: 3.99 u[IU]/mL (ref 0.350–4.500)

## 2014-01-09 MED ORDER — FLUOXETINE HCL 20 MG PO CAPS: 20.0000 mg | ORAL_CAPSULE | Freq: Every day | ORAL | Status: DC

## 2014-01-09 MED ORDER — ACAMPROSATE CALCIUM 333 MG PO TBEC: 666.0000 mg | DELAYED_RELEASE_TABLET | Freq: Three times a day (TID) | ORAL | Status: DC

## 2014-01-09 MED ORDER — TRAZODONE 25 MG HALF TABLET: 25.0000 mg | ORAL_TABLET | Freq: Every evening | ORAL | Status: DC | PRN

## 2014-01-09 NOTE — Tx Team (Signed)
Interdisciplinary Treatment Plan Update   Date Reviewed:  01/09/2014  Time Reviewed:  8:37 AM  Progress in Treatment:   Attending groups: Yes Participating in groups: Yes Taking medication as prescribed: Yes  Tolerating medication: Yes Family/Significant other contact made:  No, collateral contact with friend. Patient understands diagnosis: Yes  Discussing patient identified problems/goals with staff: Yes Medical problems stabilized or resolved: Yes Denies suicidal/homicidal ideation: Yes Patient has not harmed self or others: Yes  For review of initial/current patient goals, please see plan of care.  Estimated Length of Stay:  Discharge today  Reasons for Continued Hospitalization:   New Problems/Goals identified:    Discharge Plan or Barriers:   Home with outpatient follow up with [facility]  Additional Comments:  Attendees:  Patient:  01/09/2014 8:37 AM   Signature:  Gabriel Earing, MD 01/09/2014 8:37 AM  Signature:  01/09/2014 8:37 AM  Signature: Desma Paganini, RN 01/09/2014 8:37 AM  Signature:Beverly Danelle Earthly, RN 01/09/2014 8:37 AM  Signature:  Clinton Sawyer,  RN 01/09/2014 8:37 AM  Signature:  Joette Catching, LCSW 01/09/2014 8:37 AM  Signature:  Regan Lemming, LCSW 01/09/2014 8:37 AM  Signature:  Myles Gip, Care Coordinator [facility] 01/09/2014 8:37 AM  Signature:  01/09/2014 8:37 AM  Signature:  01/09/2014  8:37 AM  Signature:   Lars Pinks, RN [facility] 01/09/2014  8:37 AM  Signature: 01/09/2014  8:37 AM    Scribe for Treatment Team:   Joette Catching,  01/09/2014 8:37 AM

## 2014-01-09 NOTE — [facility] Suicide Risk Assessment (Signed)
Demographic Factors:  38 year old female, recently separated, two children, who are living with extended family  Total Time spent with patient: 30 minutes  Psychiatric Specialty Exam: Physical Exam  ROS  Blood pressure 132/86, pulse 70, temperature 97.6 F (36.4 C), temperature source Oral, resp. rate 16, height 5\' 6"  (1.676 m), weight 50.803 kg (112 lb), last menstrual period 09/25/2013, SpO2 97.00%.Body mass index is 18.09 kg/(m^2).  General Appearance: improved grooming   Eye Contact::  Good  Speech:  Normal Rate  Volume:  Normal  Mood:  improved mood , today denies significant depression  Affect:  Appropriate and more reactive   Thought Process:  Linear  Orientation:  Full (Time, Place, and Person)  Thought Content:  no psychotic symptoms  Suicidal Thoughts:  No- denies any suicidal ideations or self injurious ideations  Homicidal Thoughts:  No- also, specifically denies any thoughts of hurting her husband   Memory:  NA  Judgement:  Good  Insight:  Fair  Psychomotor Activity:  Normal  Concentration:  Good  Recall:  Good  Fund of Knowledge:Good  Language: Good  Akathisia:  No  Handed:  Right  AIMS (if indicated):     Assets:  Communication Skills Desire for Improvement Resilience  Sleep:  Number of Hours: 6.5    Musculoskeletal: Strength & Muscle Tone: within normal limits- no current symptoms of ETOH WDL.  Gait & Station: normal Patient leans: N/A   Mental Status Per Nursing Assessment::   On Admission:  Self-harm thoughts  Current Mental Status by Physician: At this time alert and attentive, well related, pleasant, calm,describes mood as improved, affect fuller in range, denies suicidal or homicidal ideations, no hallucinations , and no delusions  Loss Factors:  recent separation from husband and children currently with extended family  Historical Factors: alcohol dependence.   Risk Reduction Factors:   Responsible for children under 3 years of age,  Sense of responsibility to family and Positive coping skills or problem solving skills  Continued Clinical Symptoms:  Depression:   Comorbid alcohol abuse/dependence Alcohol/Substance Abuse/Dependencies  Cognitive Features That Contribute To Risk:  No gross cognitive deficits noted at t his time  Suicide Risk:  Mild:  Suicidal ideation of limited frequency, intensity, duration, and specificity.  There are no identifiable plans, no associated intent, mild dysphoria and related symptoms, good self-control (both objective and subjective assessment), few other risk factors, and identifiable protective factors, including available and accessible social support.  Discharge Diagnoses:   AXIS I:  Depressive Disorder NOS, consider MDD versus Alcohol Induced Depression. ALchol Dependence AXIS II:  Deferred AXIS III:   Past Medical History  Diagnosis Date  . Proctitis   . Cysts of both ovaries   . Seizures   . Anemia   . Anxiety   . Blood transfusion without reported diagnosis   . Depression   . Fatty liver 10/05/13   AXIS IV:  occupational problems and problems related to social environment AXIS V:  51-60 moderate symptoms ( 60 at the present time)   Plan Of Care/Follow-up recommendations:  Activity:  as tolerated  Diet:  regular Tests:  NA Other:  As below Will follow up at [facility], with appt. 6/29 Is patient on multiple antipsychotic therapies at discharge:  No   Has Patient had three or more failed trials of antipsychotic monotherapy by history:  No  Recommended Plan for Multiple Antipsychotic Therapies: NA  As noted , patient not interested in referral to an inpatient rehabilitation setting at  this time.  I spoke with patient at length about the importance of avoiding people, places and situations that she associates with alcohol use in order to minimize triggers/risk of relapse. I have encouraged ( and she states she plans to do this) Regular , hopefully daily, AA attendance.  She plans to continue CAMPRAL, which she is tolerating well.  We reviewed labs, to include BALS upon admission, LFTs, CBC abnormalities ( which are improving in the context of Sobriety/ improved PO intake). Patient plans to follow up at [facility] for ongoing medical care.     COBOS, Felicita Gage 01/09/2014, 5:26 PM

## 2014-01-09 NOTE — [facility] Suicide Risk Assessment (Deleted)
Demographic Factors:  Caucasian, Unemployed and recently separated from husband  Total Time spent with patient: 30 minutes  Psychiatric Specialty Exam: Physical Exam  ROS  Blood pressure 132/86, pulse 70, temperature 97.6 F (36.4 C), temperature source Oral, resp. rate 16, height 5\' 6"  (1.676 m), weight 50.803 kg (112 lb), last menstrual period 09/25/2013, SpO2 97.00%.Body mass index is 18.09 kg/(m^2).  General Appearance: Well Groomed  Engineer, water::  Good  Speech:  Normal Rate  Volume:  Normal  Mood:  improved, denies depression at the present time  Affect:  Appropriate and more reactive   Thought Process:  Goal Directed and Linear  Orientation:  Full (Time, Place, and Person)  Thought Content:  denies hallucinations and no delusions  Suicidal Thoughts:  No- denies any thoughts of hurting herself  Homicidal Thoughts:  No- also, specifically denies any violent ideations towards husband or anyone else   Memory:  NA  Judgement:  Good  Insight:  Fair  Psychomotor Activity:  Normal  Concentration:  Good  Recall:  Good  Fund of Knowledge:Good  Language: Good  Akathisia:  No  Handed:  Right  AIMS (if indicated):     Assets:  Communication Skills Desire for Improvement Resilience  Sleep:  Number of Hours: 6.5    Musculoskeletal: Strength & Muscle Tone: within normal limits Gait & Station: normal Patient leans: N/A Currently NO ongoing symptoms of ETOH WDL.   Mental Status Per Nursing Assessment::   On Admission:  Self-harm thoughts  Current Mental Status by Physician: at this time patient alert and attentive, well related, well groomed, mood "OK", denies depression, affect appropriate, no SI or HI, no psychosis, future oriented, focused on getting a job soon She is requesting discharge today.  Loss Factors: recent separation from her husband, children in [place] with extended family  Historical Factors: alcohol dependence  Risk Reduction Factors:   Responsible  for children under 63 years of age, Sense of responsibility to family and Positive coping skills or problem solving skills  Continued Clinical Symptoms:  Depression:   Comorbid alcohol abuse/dependence Alcohol/Substance Abuse/Dependencies  Cognitive Features That Contribute To Risk:  No gross cognitive deficits noted at this time  Suicide Risk:  Mild:  Suicidal ideation of limited frequency, intensity, duration, and specificity.  There are no identifiable plans, no associated intent, mild dysphoria and related symptoms, good self-control (both objective and subjective assessment), few other risk factors, and identifiable protective factors, including available and accessible social support.  Discharge Diagnoses:   AXIS I:  Depression NOS- consider Alcohol Induced versus MDD , Alcohol Dependence AXIS II:  Deferred AXIS III:   Past Medical History  Diagnosis Date  . Proctitis   . Cysts of both ovaries   . Seizures   . Anemia   . Anxiety   . Blood transfusion without reported diagnosis   . Depression   . Fatty liver 10/05/13   AXIS IV:  economic problems, occupational problems and recent separation AXIS V:  51-60 moderate symptoms ( 60 at this time)   Plan Of Care/Follow-up recommendations:  Activity:  As tolerated  Diet:  Regular Tests:  NA Other:  As below  Is patient on multiple antipsychotic therapies at discharge:  No   Has Patient had three or more failed trials of antipsychotic monotherapy by history:  No  Recommended Plan for Multiple Antipsychotic Therapies: NA  As noted , patient not interested in referral to an inpatient rehabilitation setting at this time. I spoke with patient at  length about the importance of avoiding people, places and situations that she associates with alcohol use in order to minimize triggers/risk of relapse. I have encouraged ( and she states she plans to do this)  Regular , hopefully daily, AA attendance. She plans to continue CAMPRAL, which  she is tolerating well.  We reviewed labs, to include  BALS upon admission, LFTs, CBC abnormalities ( which are improving in the context of  Sobriety/ improved PO intake). Patient plans to follow up at [facility] for ongoing medical care.         COBOS, FERNANDO 01/09/2014, 1:43 PM

## 2014-01-09 NOTE — Progress Notes (Signed)
[facility] Adult Case Management Discharge Plan :  Will you be returning to the same living situation after discharge: Yes,  Patient will return to her home. At discharge, do you have transportation home?:Yes,  Paitent to arrange transportation home. Do you have the ability to pay for your medications:  No, patient will be assisted with indigent medications.   Release of information consent forms completed and in the chart;  Patient's signature needed at discharge.  Patient to Follow up at: Follow-up Information   Follow up with [facility] On 01/15/2014. (You may walk-in anytime Monday-Friday between the hours of 8am - 3pm to be established for medication management and therapy.)    Specialty:  [place] information:   [place] [id number] [phone]       Patient denies SI/HI:  Patient no longer endorsing SI/HI or other thoughts of self harm.    Safety Planning and Suicide Prevention discussed: .Reviewed with all patients during discharge planning group   Hodnett, Eulas Post 01/09/2014, 10:07 AM

## 2014-01-09 NOTE — Progress Notes (Signed)
Discharge Note:  Patient discharged home with friend.  Denied SI and HI.  Denied A/V hallucinations.  Patient stated she received all her belongings, prescriptions, medications, misc items, toiletries, misc clothing, sunglasses, cell phone, wallet with cards, heat therapy patches, short, black purse.  Suicide prevention information given and discussed with patient who stated she understood and had no questions.  Patient stated she appreciated all assistance received from [facility] staff.

## 2014-01-09 NOTE — Progress Notes (Signed)
[facility] Adult Case Management Discharge Plan :  Will you be returning to the same living situation after discharge: Yes,  Patient is returning to her home. At discharge, do you have transportation home?:Yes,  Patient is arranging transportation home Do you have the ability to pay for your medications:No.  No, patient will be assisted with indigent medications.   Release of information consent forms completed and in the chart;  Patient's signature needed at discharge.  Patient to Follow up at: Follow-up Information   Follow up with [facility] On 01/15/2014. (You may walk-in anytime Monday-Friday between the hours of 8am - 3pm to be established for medication management and therapy.)    Specialty:  [place] information:   [place]  [id number] [phone]       Patient denies SI/HI:   Patient no longer endorsing SI/HI or other thoughts of self harm.   Safety Planning and Suicide Prevention discussed:  .Reviewed with all patients during discharge planning group.  Patient also given copy of SPE.   Concha Pyo 01/09/2014, 11:54 AM

## 2014-01-09 NOTE — Progress Notes (Signed)
D:  Patient's self inventory sheet, patient has fair sleep, good appetite, normal energy level, good attention span.  Rated depression 3, hopeless 2, anxiety 5.  Denied withdrawals.  Denied SI.  Pain goal 1, worst pain in past 24 hours #3.  Plans to go to AA, therapy, job, more social."  Does have discharge plans.  Needs financial assistance for medications after discharge. A:  Medications administered per MD orders.  Emotional support and encouragement given patient. R:  Denied SI and HI.  Denied A/V hallucinations.  Will continue to monitor patient for safety with 15 minute checks.  Safety maintained.

## 2014-01-09 NOTE — Progress Notes (Signed)
The focus of this group is to educate the patient on the purpose and policies of crisis stabilization and provide a format to answer questions about their admission.  The group details unit policies and expectations of patients while admitted.  Patient attended 0900 nurse education orientation group this morning.  Patient actively participated, appropriate affect, alert, appropriate insight and engagement.  Today patient will work on 3 goals for discharge.  

## 2014-01-11 NOTE — Progress Notes (Signed)
Patient Discharge Instructions:  After Visit Summary (AVS):   Faxed to:  01/11/14 Psychiatric Admission Assessment Note:   Faxed to:  01/11/14 Suicide Risk Assessment - Discharge Assessment:   Faxed to:  01/11/14 Faxed/Sent to the Next Level Care provider:  01/11/14 Next Level Care Provider Has Access to the EMR, 01/11/14 Faxed to [place] @ [phone] Records provided to [place] via [facility]/Epic access.  Patsey Berthold, 01/11/2014, 3:28 PM

## 2014-01-13 ENCOUNTER — Encounter (HOSPITAL_COMMUNITY): Payer: Self-pay | Admitting: *Deleted

## 2014-01-13 ENCOUNTER — Inpatient Hospital Stay (HOSPITAL_COMMUNITY)
Admission: AD | Admit: 2014-01-13 | Discharge: 2014-01-14 | Disposition: A | Discharge status: Home / Self Care | Payer: Self-pay | Source: Ambulatory Visit | Attending: Emergency Medicine | Admitting: Emergency Medicine

## 2014-01-13 DIAGNOSIS — F1010 Alcohol abuse, uncomplicated: Secondary | ICD-10-CM | POA: Insufficient documentation

## 2014-01-13 DIAGNOSIS — K7689 Other specified diseases of liver: Secondary | ICD-10-CM | POA: Insufficient documentation

## 2014-01-13 DIAGNOSIS — R109 Unspecified abdominal pain: Secondary | ICD-10-CM | POA: Insufficient documentation

## 2014-01-13 DIAGNOSIS — F411 Generalized anxiety disorder: Secondary | ICD-10-CM | POA: Insufficient documentation

## 2014-01-13 DIAGNOSIS — F329 Major depressive disorder, single episode, unspecified: Secondary | ICD-10-CM | POA: Insufficient documentation

## 2014-01-13 DIAGNOSIS — G8929 Other chronic pain: Secondary | ICD-10-CM

## 2014-01-13 DIAGNOSIS — R112 Nausea with vomiting, unspecified: Secondary | ICD-10-CM | POA: Insufficient documentation

## 2014-01-13 DIAGNOSIS — F3289 Other specified depressive episodes: Secondary | ICD-10-CM | POA: Insufficient documentation

## 2014-01-13 DIAGNOSIS — R569 Unspecified convulsions: Secondary | ICD-10-CM | POA: Insufficient documentation

## 2014-01-13 LAB — URINALYSIS, ROUTINE W REFLEX MICROSCOPIC
Bilirubin Urine: NEGATIVE
Glucose, UA: NEGATIVE mg/dL
KETONES UR: NEGATIVE mg/dL
Leukocytes, UA: NEGATIVE
Nitrite: NEGATIVE
PH: 7 (ref 5.0–8.0)
Protein, ur: NEGATIVE mg/dL
SPECIFIC GRAVITY, URINE: 1.01 (ref 1.005–1.030)
Urobilinogen, UA: 0.2 mg/dL (ref 0.0–1.0)

## 2014-01-13 LAB — COMPREHENSIVE METABOLIC PANEL
ALK PHOS: 128 U/L — AB (ref 39–117)
ALT: 50 U/L — AB (ref 0–35)
AST: 110 U/L — AB (ref 0–37)
Albumin: 4.1 g/dL (ref 3.5–5.2)
BILIRUBIN TOTAL: 0.3 mg/dL (ref 0.3–1.2)
BUN: 6 mg/dL (ref 6–23)
CHLORIDE: 101 meq/L (ref 96–112)
CO2: 27 meq/L (ref 19–32)
Calcium: 8.8 mg/dL (ref 8.4–10.5)
Creatinine, Ser: 0.4 mg/dL — ABNORMAL LOW (ref 0.50–1.10)
GLUCOSE: 120 mg/dL — AB (ref 70–99)
POTASSIUM: 3.7 meq/L (ref 3.7–5.3)
SODIUM: 141 meq/L (ref 137–147)
Total Protein: 7 g/dL (ref 6.0–8.3)

## 2014-01-13 LAB — CBC
HCT: 32.2 % — ABNORMAL LOW (ref 36.0–46.0)
Hemoglobin: 10.3 g/dL — ABNORMAL LOW (ref 12.0–15.0)
MCH: 30.1 pg (ref 26.0–34.0)
MCHC: 32 g/dL (ref 30.0–36.0)
MCV: 94.2 fL (ref 78.0–100.0)
PLATELETS: 159 10*3/uL (ref 150–400)
RBC: 3.42 MIL/uL — ABNORMAL LOW (ref 3.87–5.11)
RDW: 15.9 % — AB (ref 11.5–15.5)
WBC: 2.3 10*3/uL — ABNORMAL LOW (ref 4.0–10.5)

## 2014-01-13 LAB — ETHANOL: ALCOHOL ETHYL (B): 371 mg/dL — AB (ref 0–11)

## 2014-01-13 LAB — URINE MICROSCOPIC-ADD ON

## 2014-01-13 LAB — POCT PREGNANCY, URINE: Preg Test, Ur: NEGATIVE

## 2014-01-13 MED ORDER — DICYCLOMINE HCL 10 MG PO CAPS
20.0000 mg | ORAL_CAPSULE | Freq: Once | ORAL | Status: DC
  Filled 2014-01-13: qty 2

## 2014-01-13 MED ORDER — KETOROLAC TROMETHAMINE 30 MG/ML IJ SOLN: 30.0000 mg | Freq: Once | INTRAMUSCULAR | Status: DC

## 2014-01-13 MED ORDER — ONDANSETRON 8 MG PO TBDP
8.0000 mg | ORAL_TABLET | Freq: Once | ORAL | Status: AC
  Administered 2014-01-13: 8 mg via ORAL
  Filled 2014-01-13: qty 1

## 2014-01-13 MED ORDER — DICYCLOMINE HCL 10 MG PO CAPS: 20.0000 mg | ORAL_CAPSULE | Freq: Three times a day (TID) | ORAL | Status: DC

## 2014-01-13 MED ORDER — HYDROMORPHONE HCL PF 1 MG/ML IJ SOLN
1.0000 mg | Freq: Once | INTRAMUSCULAR | Status: DC
  Filled 2014-01-13: qty 1

## 2014-01-13 MED ORDER — HYDROMORPHONE HCL PF 1 MG/ML IJ SOLN
1.0000 mg | Freq: Once | INTRAMUSCULAR | Status: AC
  Administered 2014-01-13: 1 mg via INTRAVENOUS

## 2014-01-13 MED ORDER — DICYCLOMINE HCL 20 MG PO TABS
20.0000 mg | ORAL_TABLET | Freq: Once | ORAL | Status: DC
  Filled 2014-01-13: qty 1

## 2014-01-13 NOTE — MAU Provider Note (Signed)
CC: Emesis and Abdominal Pain    First Kristen Roberson Initiated Contact with Patient 01/13/14 2014      HPI Kristen Roberson is a 38 y.o. [id number] who presents via EMS with nausea and vomiting over the last 3 days and 2 days history of intermittent severe crampy lower abdominal pain. Denies any alcohol intake in recent days (but is known alcohol abuser with liver dz). She describes feeling like something is coming out of her vagina. She's been periodically doubled over in pain since yesterday. She denies dysuria, hematuria or urinary urgency. She has low back pain and hemorrhoidal pain.  LMP last month. Last intercourse 3 wks ago. States she was told she is pregnant at one of her recent ED visits. Had extensive neg abd pain work up here 12/22/2013 and  [facility] admission 01/06/14.  Past Medical History  Diagnosis Date  . Proctitis   . Cysts of both ovaries   . Seizures   . Anemia   . Anxiety   . Blood transfusion without reported diagnosis   . Depression   . Fatty liver 10/05/13    OB History  Gravida Para Term Preterm AB SAB TAB Ectopic Multiple Living  8 3   4 4    3     # Outcome Date GA Lbr Len/2nd Weight Sex Delivery Anes PTL Lv  8 SAB           7 SAB           6 SAB           5 SAB           4 PAR           3 PAR           2 PAR           1 GRA              Comments: System Generated. Please review and update pregnancy details.      Past Surgical History  Procedure Laterality Date  . Ovarian cyst removal    . Laparoscopy N/A 09/28/2013    Procedure: LAPAROSCOPY OPERATIVE;  Surgeon: Terrance Mass, MD;  Location: [place] [facility];  Service: Gynecology;  Laterality: N/A;  . Laparoscopic appendectomy Right 09/28/2013    Procedure: APPENDECTOMY LAPAROSCOPIC;  Surgeon: Terrance Mass, MD;  Location: [place] [facility];  Service: Gynecology;  Laterality: Right;  . Salpingoophorectomy Right 09/28/2013    Procedure: SALPINGO OOPHORECTOMY;  Surgeon: Terrance Mass, MD;  Location: [place] [facility];  Service: Gynecology;   Laterality: Right;  . Colonoscopy N/A 09/30/2013    Procedure: COLONOSCOPY;  Surgeon: Lafayette Dragon, MD;  Location: [facility];  Service: Endoscopy;  Laterality: N/A;  . Esophagogastroduodenoscopy N/A 11/23/2013    Procedure: ESOPHAGOGASTRODUODENOSCOPY (EGD);  Surgeon: Jerene Bears, MD;  Location: [facility];  Service: Endoscopy;  Laterality: N/A;  . Appendectomy      History   Social History  . Marital Status: Legally Separated    Spouse Name: N/A    Number of Children: N/A  . Years of Education: N/A   Occupational History  . Not on file.   Social History Main Topics  . Smoking status: Never Smoker   . Smokeless tobacco: Never Used  . Alcohol Use: 2.4 oz/week    4 Glasses of wine per week     Comment: Drinks a bottle of wine daily   . Drug Use: No  . Sexual Activity: Yes  Birth Control/ Protection: None   Other Topics Concern  . Not on file   Social History Narrative  . No narrative on file    No current facility-administered medications on file prior to encounter.   Current Outpatient Prescriptions on File Prior to Encounter  Medication Sig Dispense Refill  . acamprosate (CAMPRAL) 333 MG tablet Take 2 tablets (666 mg total) by mouth 3 (three) times daily with meals.  180 tablet  0  . FLUoxetine (PROZAC) 20 MG capsule Take 1 capsule (20 mg total) by mouth daily.  30 capsule  0  . traZODone (DESYREL) 25 mg TABS tablet Take 0.5 tablets (25 mg total) by mouth at bedtime as needed for sleep.  10 tablet  0    Allergies  Allergen Reactions  . Morphine And Related Anaphylaxis     Tolerated hydromorphone on 11/25/13.   . Tramadol Other (See Comments)    Seizures   . Penicillins Other (See Comments)    Unknown childhood reaction.    ROS Pertinent items in HPI  PHYSICAL EXAM Filed Vitals:   01/13/14 2000  BP: 126/79  Pulse: 69  Temp: 97.7 F (36.5 C)  Resp: 18   General: Well nourished, well developed female fairly coherent and not in apparent pain,  followed by episodes of getting up moaning and doubled over in apparent sever pain Cardiovascular: Normal rate Respiratory: Normal effort Abdomen: Soft, flat, minimally tender suprapubic region without rebound or guarding Back: No CVAT Extremities: No edema Neurologic: Alert and oriented, some slurring of words, gait not observed Pt refused pelvic exam- extensive testing has been done LAB RESULTS Results for orders placed during the hospital encounter of 01/13/14 (from the past 24 hour(s))  URINALYSIS, ROUTINE W REFLEX MICROSCOPIC     Status: Abnormal   Collection Time    01/13/14  8:22 PM      Result Value Ref Range   Color, Urine YELLOW  YELLOW   APPearance CLEAR  CLEAR   Specific Gravity, Urine 1.010  1.005 - 1.030   pH 7.0  5.0 - 8.0   Glucose, UA NEGATIVE  NEGATIVE mg/dL   Hgb urine dipstick TRACE (*) NEGATIVE   Bilirubin Urine NEGATIVE  NEGATIVE   Ketones, ur NEGATIVE  NEGATIVE mg/dL   Protein, ur NEGATIVE  NEGATIVE mg/dL   Urobilinogen, UA 0.2  0.0 - 1.0 mg/dL   Nitrite NEGATIVE  NEGATIVE   Leukocytes, UA NEGATIVE  NEGATIVE  URINE MICROSCOPIC-ADD ON     Status: Abnormal   Collection Time    01/13/14  8:22 PM      Result Value Ref Range   Squamous Epithelial / LPF FEW (*) RARE   WBC, UA 0-2  <3 WBC/hpf   Bacteria, UA FEW (*) RARE   Urine-Other MUCOUS PRESENT    POCT PREGNANCY, URINE     Status: None   Collection Time    01/13/14  8:33 PM      Result Value Ref Range   Preg Test, Ur NEGATIVE  NEGATIVE    IMAGING US Transvaginal Non-ob  12/22/2013   CLINICAL DATA:  Left lower quadrant pain; suspect ovarian torsion ; history of previous right salpingo oophorectomy.  EXAM: TRANSVAGINAL ULTRASOUND OF PELVIS  DOPPLER ULTRASOUND OF OVARIES  TECHNIQUE: Transvaginal ultrasound examination of the pelvis was performed including evaluation of the uterus, ovaries, adnexal regions, and pelvic cul-de-sac.  Color and duplex Doppler ultrasound was utilized to evaluate blood flow to the  ovaries.  COMPARISON:  None.  FINDINGS: Uterus  Measurements: 9.2 x 4.3 x 5.5 cm. No fibroids or other mass visualized.  Endometrium  Thickness: 12.4 mm.  No focal abnormality visualized.  Right ovary  The right ovary is surgically absent. No right adnexal masses demonstrated.  Left ovary  Measurements: 3.9 x 1.8 x 2.6 cm. Normal appearance/no adnexal mass.  Pulsed Doppler evaluation demonstrates normal low-resistance arterial and venous waveforms in the left ovary.  No free fluid is present within the pelvis.  IMPRESSION: 1. The left ovary is normal in echotexture and vascularity. 2. The uterus is normal. 3. The right ovary is surgically absent.   Electronically Signed   By: David  Martinique   On: 12/22/2013 21:21   Ct Abdomen Pelvis W Contrast  12/17/2013   CLINICAL DATA:  Abdominal pain. Worsening abdominal pain that began last night. Abdominal trauma yesterday.  EXAM: CT ABDOMEN AND PELVIS WITH CONTRAST  TECHNIQUE: Multidetector CT imaging of the abdomen and pelvis was performed using the standard protocol following bolus administration of intravenous contrast.  CONTRAST:  143mL OMNIPAQUE IOHEXOL 300 MG/ML  SOLN  COMPARISON:  10/28/2013.  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 No aggressive osseous lesions. No acute osseous injury.  Lung Bases: Normal.  Liver: Suggestion of hepatosteatosis. No focal mass lesion or hepatic injury. Heterogeneous attenuation in the medial right hepatic lobe likely represents focal fatty infiltration. This is unchanged compared to 10/28/2013.  Spleen:  Normal.  Gallbladder:  Normal.  Distended.  Common bile duct:  Normal.  Pancreas:  Normal.  Adrenal glands:  Normal.  Kidneys: Normal bilaterally. Normal enhancement and excretion. Both ureters appear normal.  Stomach:  Collapsed.  No inflammatory changes.  Small bowel: Normal. No mesenteric adenopathy. No inflammatory changes.  Colon:   Normal.  Pelvic  Genitourinary:  Normal.  Vasculature: Normal.  Body Wall: Normal.  IMPRESSION: No acute traumatic injury to the abdomen or pelvis. Hepatosteatosis.   Electronically Signed   By: Dereck Ligas M.D.   On: 12/17/2013 19:54   [place] Art/ven Flow Abd Pelv Doppler  12/22/2013   CLINICAL DATA:  Left lower quadrant pain; suspect ovarian torsion ; history of previous right salpingo oophorectomy.  EXAM: TRANSVAGINAL ULTRASOUND OF PELVIS  DOPPLER ULTRASOUND OF OVARIES  TECHNIQUE: Transvaginal ultrasound examination of the pelvis was performed including evaluation of the uterus, ovaries, adnexal regions, and pelvic cul-de-sac.  Color and duplex Doppler ultrasound was utilized to evaluate blood flow to the ovaries.  COMPARISON:  None.  FINDINGS: Uterus  Measurements: 9.2 x 4.3 x 5.5 cm. No fibroids or other mass visualized.  Endometrium  Thickness: 12.4 mm.  No focal abnormality visualized.  Right ovary  The right ovary is surgically absent. No right adnexal masses demonstrated.  Left ovary  Measurements: 3.9 x 1.8 x 2.6 cm. Normal appearance/no adnexal mass.  Pulsed Doppler evaluation demonstrates normal low-resistance arterial and venous waveforms in the left ovary.  No free fluid is present within the pelvis.  IMPRESSION: 1. The left ovary is normal in echotexture and vascularity. 2. The uterus is normal. 3. The right ovary is surgically absent.   Electronically Signed   By: David  Martinique   On: 12/22/2013 21:21    MAU COURSE Care assumed by Sherolyn Buba, NP at 2110. Lab results pending Toradol 30mg  IM given for pain; Zofran 8mg  ODT; Dilaudid 1 mg IV; Bentyl 20mg  tablet Discussed with pt that this is not a GYN issue and that she needs to see the GI- sounds  like GI pain- we have done extensive evaluation Pt states she is to see [facility] Pt has started Prozac 20mg  3 days ago Pt understands Will give Rx for Bentyl Pt has Percocet at home After medications, pt hyperventilated and had what appeared  to be a seizure like activity; Pulse ox was 20% during episode and then returned to 100% now.  Pulse 85 BP 141/100 Dr. Venora Maples called at [facility] ED who will accept transfer of pt. ASSESSMENT      PLAN Transfer to [facility]    Medication List    ASK your doctor about these medications       acamprosate 333 MG tablet  Commonly known as:  CAMPRAL  Take 2 tablets (666 mg total) by mouth 3 (three) times daily with meals.     FLUoxetine 20 MG capsule  Commonly known as:  PROZAC  Take 1 capsule (20 mg total) by mouth daily.     traZODone 25 mg Tabs tablet  Commonly known as:  DESYREL  Take 0.5 tablets (25 mg total) by mouth at bedtime as needed for sleep.          Lorene Dy, CNM 01/13/2014 8:52 PM

## 2014-01-13 NOTE — MAU Note (Signed)
Patient presents with complaints of n/v X 3 days and abdominal pain X 24 hours.

## 2014-01-13 NOTE — MAU Provider Note (Deleted)
Attestation of Attending Supervision of Advanced Practitioner (PA/CNM/NP): Evaluation and management procedures were performed by the Advanced Practitioner under my supervision and collaboration.  I have reviewed the Advanced Practitioner's note and chart, and I agree with the management and plan.  Giavanna Kang, MD, FACOG Attending Obstetrician & Gynecologist Faculty Practice, [facility]   

## 2014-01-13 NOTE — MAU Note (Signed)
Patient unable to void on admission to MAU. Informed of importance in obtaining a specimen to confirm pregnancy and rule out UTI. Instructed to call when she can void with verbal understanding rec'd.

## 2014-01-14 ENCOUNTER — Inpatient Hospital Stay (HOSPITAL_COMMUNITY): Payer: Self-pay

## 2014-01-14 MED ORDER — SODIUM CHLORIDE 0.9 % IV BOLUS (SEPSIS): 1000.0000 mL | Freq: Once | INTRAVENOUS | Status: DC

## 2014-01-14 MED ORDER — FENTANYL CITRATE 0.05 MG/ML IJ SOLN: 50.0000 ug | INTRAMUSCULAR | Status: DC | PRN

## 2014-01-14 NOTE — Discharge Instructions (Signed)
Return to the ER for further seizure activity or worsening or new concerns or if you change your mind and would like further evaluation for the seizure-like activity.  If you were given medicines take as directed.  If you are on coumadin or contraceptives realize their levels and effectiveness is altered by many different medicines.  If you have any reaction (rash, tongues swelling, other) to the medicines stop taking and see a physician.   Please follow up as directed and return to the ER or see a physician for new or worsening symptoms.  Thank you. Filed Vitals:   01/13/14 2000 01/13/14 2219 01/14/14 0034 01/14/14 0341  BP: 126/79 141/100 124/85 108/67  Pulse: 69 79 73 77  Temp: 97.7 F (36.5 C)  98.5 F (36.9 C)   TempSrc: Oral  Oral   Resp: 18 18 16 16   Height: 5\' 6"  (1.676 m)     Weight: 112 lb (50.803 kg)     SpO2:  100% 100% 100%    Abdominal Pain Many things can cause abdominal pain. Usually, abdominal pain is not caused by a disease and will improve without treatment. It can often be observed and treated at home. Your health care provider will do a physical exam and possibly order blood tests and X-rays to help determine the seriousness of your pain. However, in many cases, more time must pass before a clear cause of the pain can be found. Before that point, your health care provider may not know if you need more testing or further treatment. HOME CARE INSTRUCTIONS  Monitor your abdominal pain for any changes. The following actions may help to alleviate any discomfort you are experiencing:  Only take over-the-counter or prescription medicines as directed by your health care provider.  Do not take laxatives unless directed to do so by your health care provider.  Try a clear liquid diet (broth, tea, or water) as directed by your health care provider. Slowly move to a bland diet as tolerated. SEEK MEDICAL CARE IF:  You have unexplained abdominal pain.  You have abdominal pain  associated with nausea or diarrhea.  You have pain when you urinate or have a bowel movement.  You experience abdominal pain that wakes you in the night.  You have abdominal pain that is worsened or improved by eating food.  You have abdominal pain that is worsened with eating fatty foods.  You have a fever. SEEK IMMEDIATE MEDICAL CARE IF:   Your pain does not go away within 2 hours.  You keep throwing up (vomiting).  Your pain is felt only in portions of the abdomen, such as the right side or the left lower portion of the abdomen.  You pass bloody or black tarry stools. MAKE SURE YOU:  Understand these instructions.   Will watch your condition.   Will get help right away if you are not doing well or get worse.  Document Released: 04/15/2005 Document Revised: 07/11/2013 Document Reviewed: 03/15/2013 [facility] Patient Information 2015 [place], [place]. This information is not intended to replace advice given to you by your health care provider. Make sure you discuss any questions you have with your health care provider.

## 2014-01-14 NOTE — ED Notes (Signed)
Bed: YN18 Expected date:  Expected time:  Means of arrival:  Comments: Tx from MAU

## 2014-01-14 NOTE — ED Provider Notes (Addendum)
CSN: [id number]     Arrival date & time 01/13/14  1956 History   First MD Initiated Contact with Patient 01/14/14 0046     Chief Complaint  Patient presents with  . Emesis  . Abdominal Pain     (Consider location/radiation/quality/duration/timing/severity/associated sxs/prior Treatment) HPI Comments: 38 year old female with chronic pelvic pain, menorrhagia, anemia, fatty liver, alcohol abuse, alcohol dependence presents with abdominal pain and vomiting. Per medical records patient recently had a workup by OB/GYN for chronic pelvic pain that she's had for 6 months and recent ultrasound reviewed with no acute findings. OB/GYN recommended gastroenterology followup outpatient. Patient had seizure-like episode and was transferred over here for further evaluation. Patient drinks alcohol daily however denies drinking alcohol today despite having significant elevated alcohol level. No known head injuries or fevers. Patient complains of similar. Umbilical and pelvic discomfort she's had for 6 months. She is not followup with primary Dr. regularly as she says she cannot afford it. Pain is sharp and nonradiating. No vaginal symptoms. Intermittent nausea and vomiting nonbloody.  Patient is a 38 y.o. female presenting with vomiting and abdominal pain. The history is provided by the patient and medical records.  Emesis Associated symptoms: abdominal pain   Associated symptoms: no chills and no headaches   Abdominal Pain Associated symptoms: nausea and vomiting   Associated symptoms: no chest pain, no chills, no dysuria, no fever and no shortness of breath     Past Medical History  Diagnosis Date  . Proctitis   . Cysts of both ovaries   . Seizures   . Anemia   . Anxiety   . Blood transfusion without reported diagnosis   . Depression   . Fatty liver 10/05/13   Past Surgical History  Procedure Laterality Date  . Ovarian cyst removal    . Laparoscopy N/A 09/28/2013    Procedure: LAPAROSCOPY  OPERATIVE;  Surgeon: Terrance Mass, MD;  Location: [facility];  Service: Gynecology;  Laterality: N/A;  . Laparoscopic appendectomy Right 09/28/2013    Procedure: APPENDECTOMY LAPAROSCOPIC;  Surgeon: Terrance Mass, MD;  Location: [place] [facility];  Service: Gynecology;  Laterality: Right;  . Salpingoophorectomy Right 09/28/2013    Procedure: SALPINGO OOPHORECTOMY;  Surgeon: Terrance Mass, MD;  Location: [place] [facility] [facility];  Service: Gynecology;  Laterality: Right;  . Colonoscopy N/A 09/30/2013    Procedure: COLONOSCOPY;  Surgeon: Lafayette Dragon, MD;  Location: [facility];  Service: Endoscopy;  Laterality: N/A;  . Esophagogastroduodenoscopy N/A 11/23/2013    Procedure: ESOPHAGOGASTRODUODENOSCOPY (EGD);  Surgeon: Jerene Bears, MD;  Location: [facility];  Service: Endoscopy;  Laterality: N/A;  . Appendectomy     Family History  Problem Relation Age of Onset  . Diabetes Mother   . Hyperlipidemia Mother   . Stroke Mother   . Diabetes Father    History  Substance Use Topics  . Smoking status: Never Smoker   . Smokeless tobacco: Never Used  . Alcohol Use: 2.4 oz/week    4 Glasses of wine per week     Comment: Drinks a bottle of wine daily    OB History as of 01/13/14   Grav Para Term Preterm Abortions TAB SAB Ect Mult Living   8 3   4  4   3      Review of Systems  Constitutional: Positive for appetite change. Negative for fever and chills.  HENT: Negative for congestion.   Eyes: Negative for visual disturbance.  Respiratory: Negative for shortness of breath.   Cardiovascular: Negative for  chest pain.  Gastrointestinal: Positive for nausea, vomiting and abdominal pain. Negative for blood in stool.  Genitourinary: Negative for dysuria and flank pain.  Musculoskeletal: Negative for back pain, neck pain and neck stiffness.  Skin: Negative for rash.  Neurological: Negative for light-headedness and headaches.      Allergies  Morphine and related; Tramadol; and Penicillins  Home Medications   Prior  to Admission medications   Medication Sig Start Date End Date Taking? Authorizing Provider  acamprosate (CAMPRAL) 333 MG tablet Take 2 tablets (666 mg total) by mouth 3 (three) times daily with meals. 01/09/14  Yes Benjamine Mola, FNP  FLUoxetine (PROZAC) 20 MG capsule Take 1 capsule (20 mg total) by mouth daily. 01/09/14  Yes Benjamine Mola, FNP  traZODone (DESYREL) 25 mg TABS tablet Take 0.5 tablets (25 mg total) by mouth at bedtime as needed for sleep. 01/09/14  Yes Benjamine Mola, FNP  dicyclomine (BENTYL) 10 MG capsule Take 2 capsules (20 mg total) by mouth 3 (three) times daily before meals. 01/13/14   [place] Crunk, NP   BP 124/85  Pulse 73  Temp(Src) 98.5 F (36.9 C) (Oral)  Resp 16  Ht 5\' 6"  (1.676 m)  Wt 112 lb (50.803 kg)  BMI 18.09 kg/m2  SpO2 100%  LMP 09/25/2013  Breastfeeding? Unknown Physical Exam  Nursing note and vitals reviewed. Constitutional: She is oriented to person, place, and time. She appears well-developed and well-nourished.  HENT:  Head: Normocephalic and atraumatic.  Dry mucous membranes  Eyes: Conjunctivae are normal. Right eye exhibits no discharge. Left eye exhibits no discharge.  Neck: Normal range of motion. Neck supple. No tracheal deviation present.  Cardiovascular: Normal rate and regular rhythm.   Pulmonary/Chest: Effort normal and breath sounds normal.  Abdominal: Soft. She exhibits no distension. There is tenderness (mild periumbilical and pelvis). There is no guarding.  Musculoskeletal: She exhibits no edema.  Neurological: She is alert and oriented to person, place, and time. No cranial nerve deficit.  Mild clinical intoxication, minimal slurring of speech, answers majority of questions appropriately, equal strength bilateral upper and lower extremities gross sensation intact  Skin: Skin is warm. No rash noted.  Psychiatric: She has a normal mood and affect.    ED Course  Procedures (including critical care time) Labs Review Labs  Reviewed  URINALYSIS, ROUTINE W REFLEX MICROSCOPIC - Abnormal; Notable for the following:    Hgb urine dipstick TRACE (*)    All other components within normal limits  ETHANOL - Abnormal; Notable for the following:    Alcohol, Ethyl (B) 371 (*)    All other components within normal limits  COMPREHENSIVE METABOLIC PANEL - Abnormal; Notable for the following:    Glucose, Bld 120 (*)    Creatinine, Ser 0.40 (*)    AST 110 (*)    ALT 50 (*)    Alkaline Phosphatase 128 (*)    All other components within normal limits  CBC - Abnormal; Notable for the following:    WBC 2.3 (*)    RBC 3.42 (*)    Hemoglobin 10.3 (*)    HCT 32.2 (*)    RDW 15.9 (*)    All other components within normal limits  URINE MICROSCOPIC-ADD ON - Abnormal; Notable for the following:    Squamous Epithelial / LPF FEW (*)    Bacteria, UA FEW (*)    All other components within normal limits  POCT PREGNANCY, URINE    Imaging Review No results found.  EKG Interpretation None      MDM   Final diagnoses:  Alcohol abuse  Chronic abdominal pain  Seizure-like activity   Patient with chronic abdominal pain similar to previous patient has had ultrasounds and CT scans which resulted reviewed. OB/GYN recommended GI followup outpatient. In the nurse practitioner's note it was noted that she had brief seizure activity. I do not feel this is alcohol withdrawal seizures with alcohol level is 371. Plan for CT head, EKG and observation in the ER. Abdominal pain is similar previous she is a benign exam for me in ER. LFTs are chronically elevated.  Under the evaluation patient feels better and significant other with her in the room. She refusing all testing at this time. She understands that her seizure-like activity may be related to something in her brain or heart arrhythmia. She does not want any testing at this time aside from her chronic abdominal pain feels well. Discuss your to be observed longer in the ER to ensure she  is clinically sober.  On recheck patient feels well her abdominal pain is still there but improved and she's not vomiting. Her in her significant other are requesting the home and her significant other is driving her and will be with her the next 24 hours. Patient understands she can return if she changes her mind and would like further evaluation for this seizure like episode that she had. Recommend she followup with a primary care doctor and gastroenterology for further evaluation. I do not feel patient is clinically intoxicated on discharge and she is observe for significant amount of time and is a chronic alcohol user.  Results and differential diagnosis were discussed with the patient/parent/guardian. Close follow up outpatient was discussed, comfortable with the plan.   Medications  sodium chloride 0.9 % bolus 1,000 mL (not administered)  fentaNYL (SUBLIMAZE) injection 50 mcg (not administered)  ondansetron (ZOFRAN-ODT) disintegrating tablet 8 mg (8 mg Oral Given 01/13/14 2136)  HYDROmorphone (DILAUDID) injection 1 mg (1 mg Intravenous Given 01/13/14 2138)    Filed Vitals:   01/13/14 2000 01/13/14 2219 01/14/14 0034 01/14/14 0341  BP: 126/79 141/100 124/85 108/67  Pulse: 69 79 73 77  Temp: 97.7 F (36.5 C)  98.5 F (36.9 C)   TempSrc: Oral  Oral   Resp: 18 18 16 16   Height: 5\' 6"  (1.676 m)     Weight: 112 lb (50.803 kg)     SpO2:  100% 100% 100%       Mariea Clonts, MD 01/14/14 2263  Mariea Clonts, MD 01/14/14 [phone]

## 2014-01-14 NOTE — ED Notes (Signed)
Pt said she would like to go home. She said she is feeling better, other than the abdominal pain which is "always there," but would like to go home now. Dr. Reather Converse was made aware of her request.

## 2014-01-14 NOTE — ED Notes (Signed)
Patient is refusing all testing at this time. The patient would like to go home, however informed that she is unable to go at this time because of her ETOH level.

## 2014-01-14 NOTE — ED Notes (Signed)
Patient received from [place]. Patient is calm and cooperative at this time.

## 2014-01-14 NOTE — ED Notes (Signed)
Patient refusing any testing at this time.

## 2014-01-15 ENCOUNTER — Encounter (HOSPITAL_COMMUNITY): Payer: Self-pay | Admitting: Emergency Medicine

## 2014-01-15 ENCOUNTER — Emergency Department (HOSPITAL_COMMUNITY)
Admission: EM | Admit: 2014-01-15 | Discharge: 2014-01-15 | Discharge status: Against Medical Advice | Payer: Self-pay | Attending: Emergency Medicine | Admitting: Emergency Medicine

## 2014-01-15 DIAGNOSIS — G40909 Epilepsy, unspecified, not intractable, without status epilepticus: Secondary | ICD-10-CM | POA: Insufficient documentation

## 2014-01-15 DIAGNOSIS — R109 Unspecified abdominal pain: Secondary | ICD-10-CM | POA: Insufficient documentation

## 2014-01-15 DIAGNOSIS — N949 Unspecified condition associated with female genital organs and menstrual cycle: Secondary | ICD-10-CM | POA: Insufficient documentation

## 2014-01-15 NOTE — ED Notes (Signed)
After triage pt states she did not want to wait to be seen due to wait, encouraged to stay, encouraged to return with worsening symptoms, pt alert and oriented, no distress noted, friend was driving her home

## 2014-01-15 NOTE — ED Notes (Addendum)
Pt in c/o possible seizure like episode prior to arrival, pt alert and oriented in triage, pt was in hospital at [facility] long for seizure two days ago, pt is not on any medications for this- pt states her seizures are from her abd pain which she has had for 6 months, also n/v/d and weight loss. Pt had a seizure like episode upon arrival to ED, pt was alert and oriented after episode. Pt reports ETOH use today.

## 2014-01-16 ENCOUNTER — Telehealth: Payer: Self-pay | Admitting: *Deleted

## 2014-01-16 NOTE — Telephone Encounter (Signed)
LCSW contacted next point of contact Lenice Pressman [phone]. He stated that he will speak with the patient soon and call back to confirm her appointment.

## 2014-01-16 NOTE — Telephone Encounter (Signed)
Patient has appointment for 01/25/14 at 9:45 with her PCP. Number not working

## 2014-01-16 NOTE — Telephone Encounter (Signed)
Patient provided new number which is now on file and stated that she will be here on 01/25/14 at 9:45am to see PCP.
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
## 2014-01-23 ENCOUNTER — Inpatient Hospital Stay (HOSPITAL_COMMUNITY)
Admission: AD | Admit: 2014-01-23 | Discharge: 2014-01-23 | Disposition: A | Discharge status: Home / Self Care | Payer: Self-pay | Source: Ambulatory Visit | Attending: Obstetrics & Gynecology | Admitting: Obstetrics & Gynecology

## 2014-01-23 ENCOUNTER — Encounter (HOSPITAL_COMMUNITY): Payer: Self-pay | Admitting: *Deleted

## 2014-01-23 DIAGNOSIS — F3289 Other specified depressive episodes: Secondary | ICD-10-CM | POA: Insufficient documentation

## 2014-01-23 DIAGNOSIS — R109 Unspecified abdominal pain: Secondary | ICD-10-CM | POA: Insufficient documentation

## 2014-01-23 DIAGNOSIS — K7689 Other specified diseases of liver: Secondary | ICD-10-CM | POA: Insufficient documentation

## 2014-01-23 DIAGNOSIS — N898 Other specified noninflammatory disorders of vagina: Secondary | ICD-10-CM | POA: Insufficient documentation

## 2014-01-23 DIAGNOSIS — F329 Major depressive disorder, single episode, unspecified: Secondary | ICD-10-CM | POA: Insufficient documentation

## 2014-01-23 DIAGNOSIS — F411 Generalized anxiety disorder: Secondary | ICD-10-CM | POA: Insufficient documentation

## 2014-01-23 DIAGNOSIS — R102 Pelvic and perineal pain: Secondary | ICD-10-CM

## 2014-01-23 DIAGNOSIS — D649 Anemia, unspecified: Secondary | ICD-10-CM | POA: Insufficient documentation

## 2014-01-23 DIAGNOSIS — Z833 Family history of diabetes mellitus: Secondary | ICD-10-CM | POA: Insufficient documentation

## 2014-01-23 DIAGNOSIS — N939 Abnormal uterine and vaginal bleeding, unspecified: Secondary | ICD-10-CM

## 2014-01-23 LAB — CBC
HEMATOCRIT: 28.1 % — AB (ref 36.0–46.0)
Hemoglobin: 9.2 g/dL — ABNORMAL LOW (ref 12.0–15.0)
MCH: 30 pg (ref 26.0–34.0)
MCHC: 32.7 g/dL (ref 30.0–36.0)
MCV: 91.5 fL (ref 78.0–100.0)
Platelets: 93 10*3/uL — ABNORMAL LOW (ref 150–400)
RBC: 3.07 MIL/uL — AB (ref 3.87–5.11)
RDW: 15.7 % — AB (ref 11.5–15.5)
WBC: 1.9 10*3/uL — ABNORMAL LOW (ref 4.0–10.5)

## 2014-01-23 MED ORDER — KETOROLAC TROMETHAMINE 60 MG/2ML IM SOLN
60.0000 mg | Freq: Once | INTRAMUSCULAR | Status: AC
  Administered 2014-01-23: 60 mg via INTRAMUSCULAR
  Filled 2014-01-23: qty 2

## 2014-01-23 MED ORDER — HYDROMORPHONE HCL PF 2 MG/ML IJ SOLN
2.0000 mg | Freq: Once | INTRAMUSCULAR | Status: AC
  Administered 2014-01-23: 2 mg via INTRAMUSCULAR
  Filled 2014-01-23: qty 1

## 2014-01-23 MED ORDER — OXYCODONE-ACETAMINOPHEN 5-325 MG PO TABS: 1.0000 | ORAL_TABLET | Freq: Three times a day (TID) | ORAL | Status: DC | PRN

## 2014-01-23 NOTE — MAU Note (Signed)
Assumed care of patient.

## 2014-01-23 NOTE — MAU Note (Signed)
Patient presents to MAU via EMS stretcher complaining of abdominal pain and vaginal bleeding.

## 2014-01-23 NOTE — MAU Provider Note (Signed)
History     CSN: [id number]  Arrival date and time: 01/23/14 1511   First Provider Initiated Contact with Patient 01/23/14 1609      Chief Complaint  Patient presents with  . Abdominal Pain  . Vaginal Bleeding   Abdominal Pain Associated symptoms include diarrhea ( none today), nausea and vomiting (none today; x3 Friday). Pertinent negatives include no dysuria, fever or frequency.  Vaginal Bleeding Associated symptoms include abdominal pain, diarrhea ( none today), nausea and vomiting (none today; x3 Friday). Pertinent negatives include no chills, dysuria, fever, flank pain, frequency or urgency.    Pt is a 38 yo here with report of pelvic pain that started three days ago, but has increased in intensity today.  Pain is rated a 10/10.  Pain is described as a "mixture of pain".  Difficult to lay still.  Pain radiates down vagina.  Vaginal bleeding started 5 days ago.  Reports passing plum sized clots yesterday.  Pt has a GI appt scheduled for this Thursday.  Pelvic ultrasound on 12/22/13 was normal.    Past Medical History  Diagnosis Date  . Proctitis   . Cysts of both ovaries   . Seizures   . Anemia   . Anxiety   . Blood transfusion without reported diagnosis   . Depression   . Fatty liver 10/05/13    Past Surgical History  Procedure Laterality Date  . Ovarian cyst removal    . Laparoscopy N/A 09/28/2013    Procedure: LAPAROSCOPY OPERATIVE;  Surgeon: Terrance Mass, MD;  Location: [facility];  Service: Gynecology;  Laterality: N/A;  . Laparoscopic appendectomy Right 09/28/2013    Procedure: APPENDECTOMY LAPAROSCOPIC;  Surgeon: Terrance Mass, MD;  Location: [facility];  Service: Gynecology;  Laterality: Right;  . Salpingoophorectomy Right 09/28/2013    Procedure: SALPINGO OOPHORECTOMY;  Surgeon: Terrance Mass, MD;  Location: [facility];  Service: Gynecology;  Laterality: Right;  . Colonoscopy N/A 09/30/2013    Procedure: COLONOSCOPY;  Surgeon: [place] Dragon, MD;  Location: [facility];  Service: Endoscopy;  Laterality: N/A;  . Esophagogastroduodenoscopy N/A 11/23/2013    Procedure: ESOPHAGOGASTRODUODENOSCOPY (EGD);  Surgeon: Jerene Bears, MD;  Location: [facility];  Service: Endoscopy;  Laterality: N/A;  . Appendectomy      Family History  Problem Relation Age of Onset  . Diabetes Mother   . Hyperlipidemia Mother   . Stroke Mother   . Diabetes Father     History  Substance Use Topics  . Smoking status: Never Smoker   . Smokeless tobacco: Never Used  . Alcohol Use: 2.4 oz/week    4 Glasses of wine per week     Comment: Drinks a bottle of wine daily     Allergies:  Allergies  Allergen Reactions  . Morphine And Related Anaphylaxis     Tolerated hydromorphone on 11/25/13.   . Tramadol Other (See Comments)    Seizures   . Penicillins Other (See Comments)    Unknown childhood reaction.    Prescriptions prior to admission  Medication Sig Dispense Refill  . acamprosate (CAMPRAL) 333 MG tablet Take 2 tablets (666 mg total) by mouth 3 (three) times daily with meals.  180 tablet  0  . dicyclomine (BENTYL) 10 MG capsule Take 2 capsules (20 mg total) by mouth 3 (three) times daily before meals.  60 capsule  0  . FLUoxetine (PROZAC) 20 MG capsule Take 40 mg by mouth daily.      . traZODone (DESYREL) 25  mg TABS tablet Take 150 mg by mouth at bedtime as needed for sleep.      . [DISCONTINUED] FLUoxetine (PROZAC) 20 MG capsule Take 1 capsule (20 mg total) by mouth daily.  30 capsule  0  . [DISCONTINUED] traZODone (DESYREL) 25 mg TABS tablet Take 0.5 tablets (25 mg total) by mouth at bedtime as needed for sleep.  10 tablet  0    Review of Systems  Constitutional: Negative for fever and chills.  Gastrointestinal: Positive for nausea, vomiting (none today; x3 Friday), abdominal pain and diarrhea ( none today).  Genitourinary: Positive for vaginal bleeding. Negative for dysuria, urgency, frequency and flank pain.   Physical Exam   Blood pressure 111/77,  pulse 79, resp. rate 20, last menstrual period 09/25/2013, SpO2 100.00%, unknown if currently breastfeeding.  Physical Exam  Constitutional: She is oriented to person, place, and time. She appears well-developed and well-nourished. No distress.  Appears uncomfortable  HENT:  Head: Normocephalic.  Eyes: Pupils are equal, round, and reactive to light.  Neck: Normal range of motion. Neck supple.  Cardiovascular: Normal rate, regular rhythm and normal heart sounds.   Respiratory: Effort normal and breath sounds normal.  GI: Soft. She exhibits no mass. There is tenderness (suprapubic area). There is no rebound and no guarding.  Genitourinary: Uterus is not enlarged. Cervix exhibits no motion tenderness. Left adnexum displays no mass and no tenderness. There is bleeding (scant) around the vagina. No vaginal discharge found.  Negative cervical motion tenderness  Neurological: She is alert and oriented to person, place, and time. She has normal reflexes.  Skin: Skin is warm and dry.    MAU Course  Procedures  IM Toradol given > pt reports pain decreases from 10 > 8, desires to wait for pelvic until pain improves IM Dilaudid given (2 mg) > pt reports pain decreased from an 8>3 Assessment and Plan  Pelvic Pain  Plan: Discharge to home Percocet 5/325 (#10) Keep scheduled appt with GYN for Thursday Keep GI appointment scheduled for Thursday.    [place], CNM

## 2014-01-25 ENCOUNTER — Ambulatory Visit: Payer: Self-pay | Admitting: Internal Medicine

## 2014-01-25 NOTE — MAU Provider Note (Signed)
Attestation of Attending Supervision of Advanced Practitioner (CNM/NP): Evaluation and management procedures were performed by the Advanced Practitioner under my supervision and collaboration. I have reviewed the Advanced Practitioner's note and chart, and I agree with the management and plan.  Gillie Crisci H. 8:49 AM

## 2014-01-29 ENCOUNTER — Emergency Department (HOSPITAL_BASED_OUTPATIENT_CLINIC_OR_DEPARTMENT_OTHER)
Admission: EM | Admit: 2014-01-29 | Discharge: 2014-01-29 | Disposition: A | Discharge status: Home / Self Care | Payer: Self-pay | Attending: Emergency Medicine | Admitting: Emergency Medicine

## 2014-01-29 ENCOUNTER — Ambulatory Visit: Payer: Self-pay | Admitting: Internal Medicine

## 2014-01-29 ENCOUNTER — Encounter (HOSPITAL_BASED_OUTPATIENT_CLINIC_OR_DEPARTMENT_OTHER): Payer: Self-pay | Admitting: Emergency Medicine

## 2014-01-29 ENCOUNTER — Emergency Department (HOSPITAL_BASED_OUTPATIENT_CLINIC_OR_DEPARTMENT_OTHER): Payer: Self-pay

## 2014-01-29 DIAGNOSIS — Z79899 Other long term (current) drug therapy: Secondary | ICD-10-CM | POA: Insufficient documentation

## 2014-01-29 DIAGNOSIS — Z880 Allergy status to penicillin: Secondary | ICD-10-CM | POA: Insufficient documentation

## 2014-01-29 DIAGNOSIS — S63502A Unspecified sprain of left wrist, initial encounter: Secondary | ICD-10-CM

## 2014-01-29 DIAGNOSIS — S63509A Unspecified sprain of unspecified wrist, initial encounter: Secondary | ICD-10-CM | POA: Insufficient documentation

## 2014-01-29 DIAGNOSIS — Z862 Personal history of diseases of the blood and blood-forming organs and certain disorders involving the immune mechanism: Secondary | ICD-10-CM | POA: Insufficient documentation

## 2014-01-29 DIAGNOSIS — Z8719 Personal history of other diseases of the digestive system: Secondary | ICD-10-CM | POA: Insufficient documentation

## 2014-01-29 DIAGNOSIS — F329 Major depressive disorder, single episode, unspecified: Secondary | ICD-10-CM | POA: Insufficient documentation

## 2014-01-29 DIAGNOSIS — F411 Generalized anxiety disorder: Secondary | ICD-10-CM | POA: Insufficient documentation

## 2014-01-29 DIAGNOSIS — Y939 Activity, unspecified: Secondary | ICD-10-CM | POA: Insufficient documentation

## 2014-01-29 DIAGNOSIS — Y929 Unspecified place or not applicable: Secondary | ICD-10-CM | POA: Insufficient documentation

## 2014-01-29 DIAGNOSIS — F3289 Other specified depressive episodes: Secondary | ICD-10-CM | POA: Insufficient documentation

## 2014-01-29 DIAGNOSIS — Z8742 Personal history of other diseases of the female genital tract: Secondary | ICD-10-CM | POA: Insufficient documentation

## 2014-01-29 DIAGNOSIS — X500XXA Overexertion from strenuous movement or load, initial encounter: Secondary | ICD-10-CM | POA: Insufficient documentation

## 2014-01-29 DIAGNOSIS — Z8669 Personal history of other diseases of the nervous system and sense organs: Secondary | ICD-10-CM | POA: Insufficient documentation

## 2014-01-29 MED ORDER — IBUPROFEN 800 MG PO TABS: 800.0000 mg | ORAL_TABLET | Freq: Three times a day (TID) | ORAL | Status: DC

## 2014-01-29 MED ORDER — IBUPROFEN 800 MG PO TABS
800.0000 mg | ORAL_TABLET | Freq: Once | ORAL | Status: AC
  Administered 2014-01-29: 800 mg via ORAL
  Filled 2014-01-29: qty 1

## 2014-01-29 NOTE — Discharge Instructions (Signed)
Joint Sprain A sprain is a tear or stretch in the ligaments that hold a joint together. Severe sprains may need as long as 3-6 weeks of immobilization and/or exercises to heal completely. Sprained joints should be rested and protected. If not, they can become unstable and prone to re-injury. Proper treatment can reduce your pain, shorten the period of disability, and reduce the risk of repeated injuries. TREATMENT   Rest and elevate the injured joint to reduce pain and swelling.  Apply ice packs to the injury for 20-30 minutes every 2-3 hours for the next 2-3 days.  Keep the injury wrapped in a compression bandage or splint as long as the joint is painful or as instructed by your caregiver.  Do not use the injured joint until it is completely healed to prevent re-injury and chronic instability. Follow the instructions of your caregiver.  Long-term sprain management may require exercises and/or treatment by a physical therapist. Taping or special braces may help stabilize the joint until it is completely better. SEEK MEDICAL CARE IF:   You develop increased pain or swelling of the joint.  You develop increasing redness and warmth of the joint.  You develop a fever.  It becomes stiff.  Your hand or foot gets cold or numb. Document Released: 08/13/2004 Document Revised: 09/28/2011 Document Reviewed: 07/23/2008 [facility] Patient Information 2015 [place], [place]. This information is not intended to replace advice given to you by your health care provider. Make sure you discuss any questions you have with your health care provider.  Cryotherapy Cryotherapy means treatment with cold. Ice or gel packs can be used to reduce both pain and swelling. Ice is the most helpful within the first 24 to 48 hours after an injury or flareup from overusing a muscle or joint. Sprains, strains, spasms, burning pain, shooting pain, and aches can all be eased with ice. Ice can also be used when recovering from  surgery. Ice is effective, has very few side effects, and is safe for most people to use. PRECAUTIONS  Ice is not a safe treatment option for people with:  Raynaud's phenomenon. This is a condition affecting small blood vessels in the extremities. Exposure to cold may cause your problems to return.  Cold hypersensitivity. There are many forms of cold hypersensitivity, including:  Cold urticaria. Red, itchy hives appear on the skin when the tissues begin to warm after being iced.  Cold erythema. This is a red, itchy rash caused by exposure to cold.  Cold hemoglobinuria. Red blood cells break down when the tissues begin to warm after being iced. The hemoglobin that carry oxygen are passed into the urine because they cannot combine with blood proteins fast enough.  Numbness or altered sensitivity in the area being iced. If you have any of the following conditions, do not use ice until you have discussed cryotherapy with your caregiver:  Heart conditions, such as arrhythmia, angina, or chronic heart disease.  High blood pressure.  Healing wounds or open skin in the area being iced.  Current infections.  Rheumatoid arthritis.  Poor circulation.  Diabetes. Ice slows the blood flow in the region it is applied. This is beneficial when trying to stop inflamed tissues from spreading irritating chemicals to surrounding tissues. However, if you expose your skin to cold temperatures for too long or without the proper protection, you can damage your skin or nerves. Watch for signs of skin damage due to cold. HOME CARE INSTRUCTIONS Follow these tips to use ice and cold packs  safely.  Place a dry or damp towel between the ice and skin. A damp towel will cool the skin more quickly, so you may need to shorten the time that the ice is used.  For a more rapid response, add gentle compression to the ice.  Ice for no more than 10 to 20 minutes at a time. The bonier the area you are icing, the less  time it will take to get the benefits of ice.  Check your skin after 5 minutes to make sure there are no signs of a poor response to cold or skin damage.  Rest 20 minutes or more in between uses.  Once your skin is numb, you can end your treatment. You can test numbness by very lightly touching your skin. The touch should be so light that you do not see the skin dimple from the pressure of your fingertip. When using ice, most people will feel these normal sensations in this order: cold, burning, aching, and numbness.  Do not use ice on someone who cannot communicate their responses to pain, such as small children or people with dementia. HOW TO MAKE AN ICE PACK Ice packs are the most common way to use ice therapy. Other methods include ice massage, ice baths, and cryo-sprays. Muscle creams that cause a cold, tingly feeling do not offer the same benefits that ice offers and should not be used as a substitute unless recommended by your caregiver. To make an ice pack, do one of the following:  Place crushed ice or a bag of frozen vegetables in a sealable plastic bag. Squeeze out the excess air. Place this bag inside another plastic bag. Slide the bag into a pillowcase or place a damp towel between your skin and the bag.  Mix 3 parts water with 1 part rubbing alcohol. Freeze the mixture in a sealable plastic bag. When you remove the mixture from the freezer, it will be slushy. Squeeze out the excess air. Place this bag inside another plastic bag. Slide the bag into a pillowcase or place a damp towel between your skin and the bag. SEEK MEDICAL CARE IF:  You develop white spots on your skin. This may give the skin a blotchy (mottled) appearance.  Your skin turns blue or pale.  Your skin becomes waxy or hard.  Your swelling gets worse. MAKE SURE YOU:   Understand these instructions.  Will watch your condition.  Will get help right away if you are not doing well or get worse. Document  Released: 03/02/2011 Document Revised: 09/28/2011 Document Reviewed: 03/02/2011 [facility] Patient Information 2015 [place], [place]. This information is not intended to replace advice given to you by your health care provider. Make sure you discuss any questions you have with your health care provider.
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
## 2014-01-29 NOTE — ED Notes (Signed)
Pt states she fell last night-pain/swelling to left hand
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
## 2014-01-30 NOTE — ED Provider Notes (Signed)
Medical screening examination/treatment/procedure(s) were performed by non-physician practitioner and as supervising physician I was immediately available for consultation/collaboration.   Leota Jacobsen, MD 01/30/14 1012

## 2014-02-02 ENCOUNTER — Telehealth: Payer: Self-pay | Admitting: *Deleted

## 2014-02-02 NOTE — Telephone Encounter (Signed)
LCSW left a message on voicemail for patient to return call.    Christene Lye MSW, LCSW

## 2014-02-08 ENCOUNTER — Emergency Department (HOSPITAL_COMMUNITY)
Admission: EM | Admit: 2014-02-08 | Discharge: 2014-02-08 | Disposition: A | Discharge status: Home / Self Care | Payer: Self-pay | Attending: Emergency Medicine | Admitting: Emergency Medicine

## 2014-02-08 ENCOUNTER — Encounter (HOSPITAL_COMMUNITY): Payer: Self-pay | Admitting: Emergency Medicine

## 2014-02-08 ENCOUNTER — Emergency Department (HOSPITAL_COMMUNITY): Payer: Self-pay

## 2014-02-08 DIAGNOSIS — F329 Major depressive disorder, single episode, unspecified: Secondary | ICD-10-CM | POA: Insufficient documentation

## 2014-02-08 DIAGNOSIS — M25569 Pain in unspecified knee: Secondary | ICD-10-CM | POA: Insufficient documentation

## 2014-02-08 DIAGNOSIS — F411 Generalized anxiety disorder: Secondary | ICD-10-CM | POA: Insufficient documentation

## 2014-02-08 DIAGNOSIS — R1084 Generalized abdominal pain: Secondary | ICD-10-CM | POA: Insufficient documentation

## 2014-02-08 DIAGNOSIS — F1010 Alcohol abuse, uncomplicated: Secondary | ICD-10-CM | POA: Insufficient documentation

## 2014-02-08 DIAGNOSIS — G8929 Other chronic pain: Secondary | ICD-10-CM | POA: Insufficient documentation

## 2014-02-08 DIAGNOSIS — D61818 Other pancytopenia: Secondary | ICD-10-CM | POA: Insufficient documentation

## 2014-02-08 DIAGNOSIS — F3289 Other specified depressive episodes: Secondary | ICD-10-CM | POA: Insufficient documentation

## 2014-02-08 DIAGNOSIS — Z8719 Personal history of other diseases of the digestive system: Secondary | ICD-10-CM | POA: Insufficient documentation

## 2014-02-08 DIAGNOSIS — Z862 Personal history of diseases of the blood and blood-forming organs and certain disorders involving the immune mechanism: Secondary | ICD-10-CM | POA: Insufficient documentation

## 2014-02-08 DIAGNOSIS — Z880 Allergy status to penicillin: Secondary | ICD-10-CM | POA: Insufficient documentation

## 2014-02-08 DIAGNOSIS — Z79899 Other long term (current) drug therapy: Secondary | ICD-10-CM | POA: Insufficient documentation

## 2014-02-08 DIAGNOSIS — M25572 Pain in left ankle and joints of left foot: Secondary | ICD-10-CM

## 2014-02-08 DIAGNOSIS — Z9089 Acquired absence of other organs: Secondary | ICD-10-CM | POA: Insufficient documentation

## 2014-02-08 DIAGNOSIS — Z8742 Personal history of other diseases of the female genital tract: Secondary | ICD-10-CM | POA: Insufficient documentation

## 2014-02-08 DIAGNOSIS — Z9889 Other specified postprocedural states: Secondary | ICD-10-CM | POA: Insufficient documentation

## 2014-02-08 DIAGNOSIS — R109 Unspecified abdominal pain: Secondary | ICD-10-CM

## 2014-02-08 LAB — BASIC METABOLIC PANEL
Anion gap: 18 — ABNORMAL HIGH (ref 5–15)
BUN: 7 mg/dL (ref 6–23)
CALCIUM: 8.2 mg/dL — AB (ref 8.4–10.5)
CO2: 25 mEq/L (ref 19–32)
CREATININE: 0.37 mg/dL — AB (ref 0.50–1.10)
Chloride: 102 mEq/L (ref 96–112)
GFR calc non Af Amer: >90 mL/min (ref >90)
GLUCOSE: 120 mg/dL — AB (ref 70–99)
POTASSIUM: 3 meq/L — AB (ref 3.7–5.3)
Sodium: 145 mEq/L (ref 137–147)

## 2014-02-08 LAB — CBC WITH DIFFERENTIAL/PLATELET
BASOS ABS: 0 10*3/uL (ref 0.0–0.1)
BASOS PCT: 1 % (ref 0–1)
EOS ABS: 0 10*3/uL (ref 0.0–0.7)
Eosinophils Relative: 0 % (ref 0–5)
HEMATOCRIT: 32 % — AB (ref 36.0–46.0)
Hemoglobin: 10.6 g/dL — ABNORMAL LOW (ref 12.0–15.0)
Lymphocytes Relative: 46 % (ref 12–46)
Lymphs Abs: 0.9 10*3/uL (ref 0.7–4.0)
MCH: 29.4 pg (ref 26.0–34.0)
MCHC: 33.1 g/dL (ref 30.0–36.0)
MCV: 88.9 fL (ref 78.0–100.0)
MONO ABS: 0.2 10*3/uL (ref 0.1–1.0)
MONOS PCT: 12 % (ref 3–12)
Neutro Abs: 0.8 10*3/uL — ABNORMAL LOW (ref 1.7–7.7)
Neutrophils Relative %: 42 % — ABNORMAL LOW (ref 43–77)
Platelets: 96 10*3/uL — ABNORMAL LOW (ref 150–400)
RBC: 3.6 MIL/uL — ABNORMAL LOW (ref 3.87–5.11)
RDW: 16.8 % — AB (ref 11.5–15.5)
WBC: 1.9 10*3/uL — ABNORMAL LOW (ref 4.0–10.5)

## 2014-02-08 LAB — ETHANOL: Alcohol, Ethyl (B): 339 mg/dL — ABNORMAL HIGH (ref 0–11)

## 2014-02-08 MED ORDER — HYDROCODONE-ACETAMINOPHEN 5-325 MG PO TABS
2.0000 | ORAL_TABLET | Freq: Once | ORAL | Status: AC
  Administered 2014-02-08: 2 via ORAL
  Filled 2014-02-08: qty 2

## 2014-02-08 MED ORDER — POTASSIUM CHLORIDE CRYS ER 20 MEQ PO TBCR
40.0000 meq | EXTENDED_RELEASE_TABLET | Freq: Once | ORAL | Status: AC
  Administered 2014-02-08: 40 meq via ORAL
  Filled 2014-02-08: qty 2

## 2014-02-08 MED ORDER — ONDANSETRON 8 MG PO TBDP
8.0000 mg | ORAL_TABLET | Freq: Once | ORAL | Status: AC
  Administered 2014-02-08: 8 mg via ORAL
  Filled 2014-02-08: qty 1

## 2014-02-08 MED ORDER — HYDROCODONE-IBUPROFEN 5-200 MG PO TABS: 1.0000 | ORAL_TABLET | Freq: Three times a day (TID) | ORAL | Status: DC | PRN

## 2014-02-08 MED ORDER — IBUPROFEN 800 MG PO TABS
800.0000 mg | ORAL_TABLET | Freq: Once | ORAL | Status: AC
  Administered 2014-02-08: 800 mg via ORAL
  Filled 2014-02-08: qty 1

## 2014-02-08 NOTE — ED Notes (Signed)
Initial contact - pt to [facility] via EMS, reports sudden onset L lateral ankle pain x1 hr, pt denies injury/trauma, sts "i don't know what happened", 8/10 pain.  Trace swelling noted.  No bruising/obvious deformity noted.  Pt able to move from w/c to chair without issue.  Pt also reports "i've been having some bleeding" referring to vaginal bleeding.  Pt denies other complaints, reports hx endometriosis "and i think it's because of that".  Skin PWD.  NAD.

## 2014-02-08 NOTE — ED Notes (Signed)
Pt alert and oriented x4. Respirations even and unlabored, bilateral symmetrical rise and fall of chest. Skin warm and dry. In no acute distress. Denies needs.   

## 2014-02-08 NOTE — Progress Notes (Signed)
[facility] provided pt with a list of primary care resources and a [facility] Orange Card application to help patient establish primary care.  °

## 2014-02-08 NOTE — ED Provider Notes (Signed)
CSN: [id number]     Arrival date & time 02/08/14  1251 History   First MD Initiated Contact with Patient 02/08/14 1259     Chief Complaint  Patient presents with  . Ankle Pain  . Vaginal Bleeding     (Consider location/radiation/quality/duration/timing/severity/associated sxs/prior Treatment) The history is provided by the patient, medical records and a significant other.    Patient with hx alcoholism, chronic abdominal/pelvic pain, with 34 ED visits in the past 6 months presents with left ankle pain and swelling without known injury.  The pain began around noon today, described as pins and needles, located over lateral malleolus.  8/10 intensity. Denies any known injury.  Denies drinking ETOH enough that she would not remember an injury.  Pt has chronic abdominal/pelvic pain and vaginal bleeding, this is unchanged x several months. She also notes she does not eat or drink well and has lost a large amount of weight in the past several months.  This is no acute change in any of her chronic issues and pt is planning to follow up with obgyn for a suspected diagnosis of endometriosis.  Has had one episode of dizziness and wants blood work done to make sure she has not become too anemic.    Past Medical History  Diagnosis Date  . Proctitis   . Cysts of both ovaries   . Seizures   . Anemia   . Anxiety   . Blood transfusion without reported diagnosis   . Depression   . Fatty liver 10/05/13   Past Surgical History  Procedure Laterality Date  . Ovarian cyst removal    . Laparoscopy N/A 09/28/2013    Procedure: LAPAROSCOPY OPERATIVE;  Surgeon: Terrance Mass, MD;  Location: [facility];  Service: Gynecology;  Laterality: N/A;  . Laparoscopic appendectomy Right 09/28/2013    Procedure: APPENDECTOMY LAPAROSCOPIC;  Surgeon: Terrance Mass, MD;  Location: [facility];  Service: Gynecology;  Laterality: Right;  . Salpingoophorectomy Right 09/28/2013    Procedure: SALPINGO OOPHORECTOMY;  Surgeon: Terrance Mass, MD;  Location: [facility];  Service: Gynecology;  Laterality: Right;  . Colonoscopy N/A 09/30/2013    Procedure: COLONOSCOPY;  Surgeon: Lafayette Dragon, MD;  Location: [facility];  Service: Endoscopy;  Laterality: N/A;  . Esophagogastroduodenoscopy N/A 11/23/2013    Procedure: ESOPHAGOGASTRODUODENOSCOPY (EGD);  Surgeon: Jerene Bears, MD;  Location: [facility];  Service: Endoscopy;  Laterality: N/A;  . Appendectomy     Family History  Problem Relation Age of Onset  . Diabetes Mother   . Hyperlipidemia Mother   . Stroke Mother   . Diabetes Father    History  Substance Use Topics  . Smoking status: Never Smoker   . Smokeless tobacco: Never Used  . Alcohol Use: Yes   OB History   Grav Para Term Preterm Abortions TAB SAB Ect Mult Living   7 3   4  4   3      Review of Systems  All other systems reviewed and are negative.     Allergies  Morphine and related; Tramadol; and Penicillins  Home Medications   Prior to Admission medications   Medication Sig Start Date End Date Taking? Authorizing Provider  acamprosate (CAMPRAL) 333 MG tablet Take 2 tablets (666 mg total) by mouth 3 (three) times daily with meals. 01/09/14  Yes Benjamine Mola, FNP  acetaminophen (TYLENOL) 325 MG tablet Take 650 mg by mouth every 6 (six) hours as needed for moderate pain.   Yes Historical  Provider, MD  FLUoxetine (PROZAC) 20 MG capsule Take 40 mg by mouth daily. 01/09/14  Yes Benjamine Mola, FNP  tetrahydrozoline 0.05 % ophthalmic solution Place 1 drop into both eyes 2 (two) times daily as needed (dry eyes).   Yes Historical Provider, MD  traZODone (DESYREL) 25 mg TABS tablet Take 150 mg by mouth at bedtime as needed for sleep. 01/09/14  Yes Benjamine Mola, FNP   BP 131/82  Pulse 77  Temp(Src) 97.9 F (36.6 C) (Oral)  Resp 18  SpO2 99%  LMP 09/25/2013 Physical Exam  Nursing note and vitals reviewed. Constitutional: She appears well-developed and well-nourished. No distress.  Cardiovascular:  Normal rate and regular rhythm.   Pulmonary/Chest: Effort normal and breath sounds normal. No respiratory distress. She has no wheezes. She has no rales. She exhibits no tenderness.  Abdominal: Soft. She exhibits no distension. There is generalized tenderness. There is no rebound and no guarding.  Musculoskeletal:       Left knee: She exhibits no swelling. No tenderness found.       Left upper leg: She exhibits no tenderness and no swelling.       Left lower leg: Normal.       Left foot: Normal.       Feet:  Neurological: She is alert.  Skin: She is not diaphoretic.    ED Course  Procedures (including critical care time) Labs Review Labs Reviewed  CBC WITH DIFFERENTIAL - Abnormal; Notable for the following:    WBC 1.9 (*)    RBC 3.60 (*)    Hemoglobin 10.6 (*)    HCT 32.0 (*)    RDW 16.8 (*)    Platelets 96 (*)    Neutrophils Relative % 42 (*)    Neutro Abs 0.8 (*)    All other components within normal limits  BASIC METABOLIC PANEL - Abnormal; Notable for the following:    Potassium 3.0 (*)    Glucose, Bld 120 (*)    Creatinine, Ser 0.37 (*)    Calcium 8.2 (*)    Anion gap 18 (*)    All other components within normal limits  ETHANOL - Abnormal; Notable for the following:    Alcohol, Ethyl (B) 339 (*)    All other components within normal limits  PATHOLOGIST SMEAR REVIEW    Imaging Review Dg Ankle Complete Left  02/08/2014   CLINICAL DATA:  Left ankle pain and discoloration for 1 day.  EXAM: LEFT ANKLE COMPLETE - 3+ VIEW  COMPARISON:  None.  FINDINGS: No acute bony or joint abnormality is identified. Small osteophyte off the dorsal aspect of the proximal navicular bone is noted. Soft tissue structures appear normal.  IMPRESSION: Negative exam.   Electronically Signed   By: Inge Rise M.D.   On: 02/08/2014 16:20     EKG Interpretation   Date/Time:  Thursday February 08 2014 18:29:56 EDT Ventricular Rate:  64 PR Interval:  142 QRS Duration: 94 QT Interval:   487 QTC Calculation: 502 R Axis:   72 Text Interpretation:  Sinus rhythm Low voltage, precordial leads  Nonspecific T abnrm, anterolateral leads Borderline prolonged QT interval  no change from prior Confirmed by HORTON  MD, [place] ([id number]) on  02/08/2014 6:32:55 PM      Pt checked on [place].  #10 percocet given 01/23/14, otherwise no prescriptions for controlled substances in system since late May 2015.  MDM   Final diagnoses:  Left ankle pain  Alcohol abuse  Pancytopenia  Chronic abdominal pain    Pt with multiple chronic problems including alcoholism and chronic abdominal pain and vaginal bleeding, presenting with left ankle pain with unknown injury.  She has some scattered small ecchymoses and slight edema.  No erythema or warmth or clinical e/o joint infection, cellulitis, or DVT.  Xray negative.  Pt placed in ASO.  Labs show chronic pancytopenia, hypokalemia, alcohol intoxication.  Suspect pt either sprained ankle while intoxicated and doesn't remember who or for some reason had some sort of spontaneous rupture of the ligament.  Ankle is stable.  Discussed alcoholism and all results with patient.  Pt reports she is depressed but denies SI.  She declines help with her alcoholism, states she will go to AA with her significant other, who is also currently drinking.  Chronic abdominal pain and occasional vaginal bleeding is completely unchanged per patient.  Hgb 10.6, no need for transfusion.  After patient being here for 5 hours as I was discussing all of her results with her and planning for d/c home, pt mentioned that she has been having panic attacks at night with chest pressure.  This occurs only when her anxiety and stress are high.  Discussed this with Dr Dina Rich.  Screening EKG ordered prior to discharge.  Unchanged from prior.  D/C home with vicoprofen #6 after review of [place].  PCP, gyn follow up.  Discussed result, findings, treatment, and follow up  with patient.  Pt  given return precautions.  Pt verbalizes understanding and agrees with plan.         Clayton Bibles, PA-C 02/08/14 2203

## 2014-02-08 NOTE — Progress Notes (Signed)
  CARE MANAGEMENT ED NOTE 02/08/2014  Patient:  AILYNN, GOW   Account Number:  [id number]  Date Initiated:  02/08/2014  Documentation initiated by:  Livia Snellen  Subjective/Objective Assessment:   Patient presents to Ed with left ankle swelling for one hour and vaginal bleeding     Subjective/Objective Assessment Detail:   Patient with pmhx of seizures, anemia, depression, fatty liver and blood transfusions     Action/Plan:   Action/Plan Detail:   Anticipated DC Date:       Status Recommendation to Physician:   Result of Recommendation:    Other ED [place]  Other  PCP issues    Choice offered to / List presented to:            Status of service:  Completed, signed off  ED Comments:   ED Comments Detail:  EDCM spoke to patient at bedside.  Patient confirms her pcp is Dr. Doreene Burke at the [facility].  Patient reports, "I haven't been there in a long time."  Patient confirms she has an OBGYN at [facility] hospital but cannot remember his name at this time.  Patient reports she is concerned about going to the physicians office because she is concerned about the cost of the visit.  [facility] provided patient with pamphlet to [facility].  [facility] informed patient that walk ins are welcome from 9am-11am and 2pm -4pm  Mon- Thurs.  [facility] informed patient that she may receive assistance with her medications, speak to a financial counselor or social worker and enroll for the orange card there.  [facility] liason provided patient with orange card information.  EDCM received patient's permission to email [facility] in attempts to make follow up visit.  Patient thankful for resources.

## 2014-02-08 NOTE — ED Notes (Signed)
Pt reports she does not do well taking PO pills. Requesting them to be cut in half. Pt still working on taking pills. rn offered to crush meds and put in applesauce. Pt reports she does not like applesauce.

## 2014-02-08 NOTE — ED Notes (Signed)
Pt in from home by ems, c/o L ankle pain and swelling x1 hour.

## 2014-02-08 NOTE — Discharge Instructions (Signed)
Read the information below.  You may return to the Emergency Department at any time for worsening condition or any new symptoms that concern you.  If you develop uncontrolled pain, weakness or numbness of the extremity, severe discoloration of the skin, or you are unable to walk, return to the ER for a recheck.     If you develop high fevers, worsening abdominal pain, uncontrolled vomiting, or are unable to tolerate fluids by mouth, return to the ER for a recheck.      Ankle Pain Ankle pain is a common symptom. The bones, cartilage, tendons, and muscles of the ankle joint perform a lot of work each day. The ankle joint holds your body weight and allows you to move around. Ankle pain can occur on either side or back of 1 or both ankles. Ankle pain may be sharp and burning or dull and aching. There may be tenderness, stiffness, redness, or warmth around the ankle. The pain occurs more often when a person walks or puts pressure on the ankle. CAUSES  There are many reasons ankle pain can develop. It is important to work with your caregiver to identify the cause since many conditions can impact the bones, cartilage, muscles, and tendons. Causes for ankle pain include:  Injury, including a break (fracture), sprain, or strain often due to a fall, sports, or a high-impact activity.  Swelling (inflammation) of a tendon (tendonitis).  Achilles tendon rupture.  Ankle instability after repeated sprains and strains.  Poor foot alignment.  Pressure on a nerve (tarsal tunnel syndrome).  Arthritis in the ankle or the lining of the ankle.  Crystal formation in the ankle (gout or pseudogout). DIAGNOSIS  A diagnosis is based on your medical history, your symptoms, results of your physical exam, and results of diagnostic tests. Diagnostic tests may include X-ray exams or a computerized magnetic scan (magnetic resonance imaging, MRI). TREATMENT  Treatment will depend on the cause of your ankle pain and may  include:  Keeping pressure off the ankle and limiting activities.  Using crutches or other walking support (a cane or brace).  Using rest, ice, compression, and elevation.  Participating in physical therapy or home exercises.  Wearing shoe inserts or special shoes.  Losing weight.  Taking medications to reduce pain or swelling or receiving an injection.  Undergoing surgery. HOME CARE INSTRUCTIONS   Only take over-the-counter or prescription medicines for pain, discomfort, or fever as directed by your caregiver.  Put ice on the injured area.  Put ice in a plastic bag.  Place a towel between your skin and the bag.  Leave the ice on for 15-20 minutes at a time, 03-04 times a day.  Keep your leg raised (elevated) when possible to lessen swelling.  Avoid activities that cause ankle pain.  Follow specific exercises as directed by your caregiver.  Record how often you have ankle pain, the location of the pain, and what it feels like. This information may be helpful to you and your caregiver.  Ask your caregiver about returning to work or sports and whether you should drive.  Follow up with your caregiver for further examination, therapy, or testing as directed. SEEK MEDICAL CARE IF:   Pain or swelling continues or worsens beyond 1 week.  You have an oral temperature above 102 F (38.9 C).  You are feeling unwell or have chills.  You are having an increasingly difficult time with walking.  You have loss of sensation or other new symptoms.  You have  questions or concerns. MAKE SURE YOU:   Understand these instructions.  Will watch your condition.  Will get help right away if you are not doing well or get worse. Document Released: 12/24/2009 Document Revised: 09/28/2011 Document Reviewed: 12/24/2009 [facility] Patient Information 2015 [place], [place]. This information is not intended to replace advice given to you by your health care provider. Make sure you discuss  any questions you have with your health care provider.  Abdominal Pain, Women Abdominal (stomach, pelvic, or belly) pain can be caused by many things. It is important to tell your doctor:  The location of the pain.  Does it come and go or is it present all the time?  Are there things that start the pain (eating certain foods, exercise)?  Are there other symptoms associated with the pain (fever, nausea, vomiting, diarrhea)? All of this is helpful to know when trying to find the cause of the pain. CAUSES   Stomach: virus or bacteria infection, or ulcer.  Intestine: appendicitis (inflamed appendix), regional ileitis (Crohn's disease), ulcerative colitis (inflamed colon), irritable bowel syndrome, diverticulitis (inflamed diverticulum of the colon), or cancer of the stomach or intestine.  Gallbladder disease or stones in the gallbladder.  Kidney disease, kidney stones, or infection.  Pancreas infection or cancer.  Fibromyalgia (pain disorder).  Diseases of the female organs:  Uterus: fibroid (non-cancerous) tumors or infection.  Fallopian tubes: infection or tubal pregnancy.  Ovary: cysts or tumors.  Pelvic adhesions (scar tissue).  Endometriosis (uterus lining tissue growing in the pelvis and on the pelvic organs).  Pelvic congestion syndrome (female organs filling up with blood just before the menstrual period).  Pain with the menstrual period.  Pain with ovulation (producing an egg).  Pain with an IUD (intrauterine device, birth control) in the uterus.  Cancer of the female organs.  Functional pain (pain not caused by a disease, may improve without treatment).  Psychological pain.  Depression. DIAGNOSIS  Your doctor will decide the seriousness of your pain by doing an examination.  Blood tests.  X-rays.  Ultrasound.  CT scan (computed tomography, special type of X-ray).  MRI (magnetic resonance imaging).  Cultures, for infection.  Barium enema (dye  inserted in the large intestine, to better view it with X-rays).  Colonoscopy (looking in intestine with a lighted tube).  Laparoscopy (minor surgery, looking in abdomen with a lighted tube).  Major abdominal exploratory surgery (looking in abdomen with a large incision). TREATMENT  The treatment will depend on the cause of the pain.   Many cases can be observed and treated at home.  Over-the-counter medicines recommended by your caregiver.  Prescription medicine.  Antibiotics, for infection.  Birth control pills, for painful periods or for ovulation pain.  Hormone treatment, for endometriosis.  Nerve blocking injections.  Physical therapy.  Antidepressants.  Counseling with a psychologist or psychiatrist.  Minor or major surgery. HOME CARE INSTRUCTIONS   Do not take laxatives, unless directed by your caregiver.  Take over-the-counter pain medicine only if ordered by your caregiver. Do not take aspirin because it can cause an upset stomach or bleeding.  Try a clear liquid diet (broth or water) as ordered by your caregiver. Slowly move to a bland diet, as tolerated, if the pain is related to the stomach or intestine.  Have a thermometer and take your temperature several times a day, and record it.  Bed rest and sleep, if it helps the pain.  Avoid sexual intercourse, if it causes pain.  Avoid stressful situations.  Keep your follow-up appointments and tests, as your caregiver orders.  If the pain does not go away with medicine or surgery, you may try:  Acupuncture.  Relaxation exercises (yoga, meditation).  Group therapy.  Counseling. SEEK MEDICAL CARE IF:   You notice certain foods cause stomach pain.  Your home care treatment is not helping your pain.  You need stronger pain medicine.  You want your IUD removed.  You feel faint or lightheaded.  You develop nausea and vomiting.  You develop a rash.  You are having side effects or an allergy to  your medicine. SEEK IMMEDIATE MEDICAL CARE IF:   Your pain does not go away or gets worse.  You have a fever.  Your pain is felt only in portions of the abdomen. The right side could possibly be appendicitis. The left lower portion of the abdomen could be colitis or diverticulitis.  You are passing blood in your stools (bright red or black tarry stools, with or without vomiting).  You have blood in your urine.  You develop chills, with or without a fever.  You pass out. MAKE SURE YOU:   Understand these instructions.  Will watch your condition.  Will get help right away if you are not doing well or get worse. Document Released: 05/03/2007 Document Revised: 11/20/2013 Document Reviewed: 05/23/2009 [facility] Patient Information 2015 [place], [place]. This information is not intended to replace advice given to you by your health care provider. Make sure you discuss any questions you have with your health care provider.  Alcohol Use Disorder Alcohol use disorder is a mental disorder. It is not a one-time incident of heavy drinking. Alcohol use disorder is the excessive and uncontrollable use of alcohol over time that leads to problems with functioning in one or more areas of daily living. People with this disorder risk harming themselves and others when they drink to excess. Alcohol use disorder also can cause other mental disorders, such as mood and anxiety disorders, and serious physical problems. People with alcohol use disorder often misuse other drugs.  Alcohol use disorder is common and widespread. Some people with this disorder drink alcohol to cope with or escape from negative life events. Others drink to relieve chronic pain or symptoms of mental illness. People with a family history of alcohol use disorder are at higher risk of losing control and using alcohol to excess.  SYMPTOMS  Signs and symptoms of alcohol use disorder may include the following:   Consumption ofalcohol  inlarger amounts or over a longer period of time than intended.  Multiple unsuccessful attempts to cutdown or control alcohol use.   A great deal of time spent obtaining alcohol, using alcohol, or recovering from the effects of alcohol (hangover).  A strong desire or urge to use alcohol (cravings).   Continued use of alcohol despite problems at work, school, or home because of alcohol use.   Continued use of alcohol despite problems in relationships because of alcohol use.  Continued use of alcohol in situations when it is physically hazardous, such as driving a car.  Continued use of alcohol despite awareness of a physical or psychological problem that is likely related to alcohol use. Physical problems related to alcohol use can involve the brain, heart, liver, stomach, and intestines. Psychological problems related to alcohol use include intoxication, depression, anxiety, psychosis, delirium, and dementia.   The need for increased amounts of alcohol to achieve the same desired effect, or a decreased effect from the consumption of the same  amount of alcohol (tolerance).  Withdrawal symptoms upon reducing or stopping alcohol use, or alcohol use to reduce or avoid withdrawal symptoms. Withdrawal symptoms include:  Racing heart.  Hand tremor.  Difficulty sleeping.  Nausea.  Vomiting.  Hallucinations.  Restlessness.  Seizures. DIAGNOSIS Alcohol use disorder is diagnosed through an assessment by your health care provider. Your health care provider may start by asking three or four questions to screen for excessive or problematic alcohol use. To confirm a diagnosis of alcohol use disorder, at least two symptoms must be present within a 51-month period. The severity of alcohol use disorder depends on the number of symptoms:  Mild--two or three.  Moderate--four or five.  Severe--six or more. Your health care provider may perform a physical exam or use results from lab tests  to see if you have physical problems resulting from alcohol use. Your health care provider may refer you to a mental health professional for evaluation. TREATMENT  Some people with alcohol use disorder are able to reduce their alcohol use to low-risk levels. Some people with alcohol use disorder need to quit drinking alcohol. When necessary, mental health professionals with specialized training in substance use treatment can help. Your health care provider can help you decide how severe your alcohol use disorder is and what type of treatment you need. The following forms of treatment are available:   Detoxification. Detoxification involves the use of prescription medicines to prevent alcohol withdrawal symptoms in the first week after quitting. This is important for people with a history of symptoms of withdrawal and for heavy drinkers who are likely to have withdrawal symptoms. Alcohol withdrawal can be dangerous and, in severe cases, cause death. Detoxification is usually provided in a hospital or in-patient substance use treatment facility.  Counseling or talk therapy. Talk therapy is provided by substance use treatment counselors. It addresses the reasons people use alcohol and ways to keep them from drinking again. The goals of talk therapy are to help people with alcohol use disorder find healthy activities and ways to cope with life stress, to identify and avoid triggers for alcohol use, and to handle cravings, which can cause relapse.  Medicines.Different medicines can help treat alcohol use disorder through the following actions:  Decrease alcohol cravings.  Decrease the positive reward response felt from alcohol use.  Produce an uncomfortable physical reaction when alcohol is used (aversion therapy).  Support groups. Support groups are run by people who have quit drinking. They provide emotional support, advice, and guidance. These forms of treatment are often combined. Some people with  alcohol use disorder benefit from intensive combination treatment provided by specialized substance use treatment centers. Both inpatient and outpatient treatment programs are available. Document Released: 08/13/2004 Document Revised: 11/20/2013 Document Reviewed: 10/13/2012 [facility] Patient Information 2015 [place], [place]. This information is not intended to replace advice given to you by your health care provider. Make sure you discuss any questions you have with your health care provider.

## 2014-02-09 LAB — PATHOLOGIST SMEAR REVIEW

## 2014-02-10 NOTE — ED Provider Notes (Signed)
Medical screening examination/treatment/procedure(s) were performed by non-physician practitioner and as supervising physician I was immediately available for consultation/collaboration.   EKG Interpretation   Date/Time:  Thursday February 08 2014 18:29:56 EDT Ventricular Rate:  64 PR Interval:  142 QRS Duration: 94 QT Interval:  487 QTC Calculation: 502 R Axis:   72 Text Interpretation:  Sinus rhythm Low voltage, precordial leads  Nonspecific T abnrm, anterolateral leads Borderline prolonged QT interval  no change from prior Confirmed by Dina Rich  MD, COURTNEY ([id number]) on  02/08/2014 6:32:55 PM        Merryl Hacker, MD 02/10/14 [phone]

## 2014-02-13 ENCOUNTER — Emergency Department (HOSPITAL_COMMUNITY): Payer: Self-pay

## 2014-02-13 ENCOUNTER — Encounter (HOSPITAL_COMMUNITY): Payer: Self-pay | Admitting: Radiology

## 2014-02-13 ENCOUNTER — Inpatient Hospital Stay (HOSPITAL_COMMUNITY): Payer: MEDICAID

## 2014-02-13 ENCOUNTER — Inpatient Hospital Stay (HOSPITAL_COMMUNITY): Payer: Self-pay

## 2014-02-13 ENCOUNTER — Inpatient Hospital Stay (HOSPITAL_COMMUNITY)
Admission: EM | Admit: 2014-02-13 | Discharge: 2014-02-27 | DRG: 286 | Disposition: A | Discharge status: Home / Self Care | Payer: Self-pay | Attending: Internal Medicine | Admitting: Internal Medicine

## 2014-02-13 DIAGNOSIS — R569 Unspecified convulsions: Secondary | ICD-10-CM | POA: Diagnosis present

## 2014-02-13 DIAGNOSIS — R9431 Abnormal electrocardiogram [ECG] [EKG]: Secondary | ICD-10-CM

## 2014-02-13 DIAGNOSIS — K6289 Other specified diseases of anus and rectum: Secondary | ICD-10-CM

## 2014-02-13 DIAGNOSIS — G931 Anoxic brain damage, not elsewhere classified: Secondary | ICD-10-CM | POA: Diagnosis present

## 2014-02-13 DIAGNOSIS — I5181 Takotsubo syndrome: Secondary | ICD-10-CM | POA: Diagnosis present

## 2014-02-13 DIAGNOSIS — I5021 Acute systolic (congestive) heart failure: Secondary | ICD-10-CM | POA: Diagnosis present

## 2014-02-13 DIAGNOSIS — Z79899 Other long term (current) drug therapy: Secondary | ICD-10-CM

## 2014-02-13 DIAGNOSIS — I5023 Acute on chronic systolic (congestive) heart failure: Secondary | ICD-10-CM | POA: Diagnosis present

## 2014-02-13 DIAGNOSIS — E872 Acidosis, unspecified: Secondary | ICD-10-CM | POA: Diagnosis present

## 2014-02-13 DIAGNOSIS — K760 Fatty (change of) liver, not elsewhere classified: Secondary | ICD-10-CM

## 2014-02-13 DIAGNOSIS — F05 Delirium due to known physiological condition: Secondary | ICD-10-CM | POA: Diagnosis present

## 2014-02-13 DIAGNOSIS — G9340 Encephalopathy, unspecified: Secondary | ICD-10-CM | POA: Diagnosis present

## 2014-02-13 DIAGNOSIS — E440 Moderate protein-calorie malnutrition: Secondary | ICD-10-CM | POA: Diagnosis present

## 2014-02-13 DIAGNOSIS — F32A Depression, unspecified: Secondary | ICD-10-CM

## 2014-02-13 DIAGNOSIS — F10231 Alcohol dependence with withdrawal delirium: Secondary | ICD-10-CM

## 2014-02-13 DIAGNOSIS — F10931 Alcohol use, unspecified with withdrawal delirium: Secondary | ICD-10-CM

## 2014-02-13 DIAGNOSIS — F329 Major depressive disorder, single episode, unspecified: Secondary | ICD-10-CM

## 2014-02-13 DIAGNOSIS — I469 Cardiac arrest, cause unspecified: Secondary | ICD-10-CM | POA: Diagnosis present

## 2014-02-13 DIAGNOSIS — R570 Cardiogenic shock: Secondary | ICD-10-CM | POA: Diagnosis present

## 2014-02-13 DIAGNOSIS — Z681 Body mass index (BMI) 19 or less, adult: Secondary | ICD-10-CM

## 2014-02-13 DIAGNOSIS — I472 Ventricular tachycardia, unspecified: Secondary | ICD-10-CM | POA: Diagnosis not present

## 2014-02-13 DIAGNOSIS — G621 Alcoholic polyneuropathy: Secondary | ICD-10-CM

## 2014-02-13 DIAGNOSIS — Z888 Allergy status to other drugs, medicaments and biological substances status: Secondary | ICD-10-CM

## 2014-02-13 DIAGNOSIS — Z833 Family history of diabetes mellitus: Secondary | ICD-10-CM

## 2014-02-13 DIAGNOSIS — Z823 Family history of stroke: Secondary | ICD-10-CM

## 2014-02-13 DIAGNOSIS — G40401 Other generalized epilepsy and epileptic syndromes, not intractable, with status epilepticus: Secondary | ICD-10-CM | POA: Diagnosis present

## 2014-02-13 DIAGNOSIS — F10230 Alcohol dependence with withdrawal, uncomplicated: Secondary | ICD-10-CM

## 2014-02-13 DIAGNOSIS — Z9889 Other specified postprocedural states: Secondary | ICD-10-CM

## 2014-02-13 DIAGNOSIS — E162 Hypoglycemia, unspecified: Secondary | ICD-10-CM | POA: Diagnosis not present

## 2014-02-13 DIAGNOSIS — K7689 Other specified diseases of liver: Secondary | ICD-10-CM | POA: Diagnosis present

## 2014-02-13 DIAGNOSIS — F10239 Alcohol dependence with withdrawal, unspecified: Secondary | ICD-10-CM

## 2014-02-13 DIAGNOSIS — I5043 Acute on chronic combined systolic (congestive) and diastolic (congestive) heart failure: Secondary | ICD-10-CM

## 2014-02-13 DIAGNOSIS — E876 Hypokalemia: Secondary | ICD-10-CM | POA: Diagnosis present

## 2014-02-13 DIAGNOSIS — K648 Other hemorrhoids: Secondary | ICD-10-CM

## 2014-02-13 DIAGNOSIS — I509 Heart failure, unspecified: Secondary | ICD-10-CM | POA: Diagnosis present

## 2014-02-13 DIAGNOSIS — R410 Disorientation, unspecified: Secondary | ICD-10-CM

## 2014-02-13 DIAGNOSIS — I4729 Other ventricular tachycardia: Secondary | ICD-10-CM | POA: Diagnosis not present

## 2014-02-13 DIAGNOSIS — F411 Generalized anxiety disorder: Secondary | ICD-10-CM | POA: Diagnosis present

## 2014-02-13 DIAGNOSIS — R1310 Dysphagia, unspecified: Secondary | ICD-10-CM

## 2014-02-13 DIAGNOSIS — R102 Pelvic and perineal pain: Secondary | ICD-10-CM

## 2014-02-13 DIAGNOSIS — N946 Dysmenorrhea, unspecified: Secondary | ICD-10-CM

## 2014-02-13 DIAGNOSIS — R1312 Dysphagia, oropharyngeal phase: Secondary | ICD-10-CM | POA: Diagnosis present

## 2014-02-13 DIAGNOSIS — Z8742 Personal history of other diseases of the female genital tract: Secondary | ICD-10-CM

## 2014-02-13 DIAGNOSIS — F1910 Other psychoactive substance abuse, uncomplicated: Secondary | ICD-10-CM | POA: Diagnosis present

## 2014-02-13 DIAGNOSIS — D538 Other specified nutritional anemias: Secondary | ICD-10-CM

## 2014-02-13 DIAGNOSIS — F1010 Alcohol abuse, uncomplicated: Secondary | ICD-10-CM

## 2014-02-13 DIAGNOSIS — F4321 Adjustment disorder with depressed mood: Secondary | ICD-10-CM | POA: Diagnosis present

## 2014-02-13 DIAGNOSIS — J9601 Acute respiratory failure with hypoxia: Secondary | ICD-10-CM | POA: Diagnosis present

## 2014-02-13 DIAGNOSIS — D62 Acute posthemorrhagic anemia: Secondary | ICD-10-CM

## 2014-02-13 DIAGNOSIS — N898 Other specified noninflammatory disorders of vagina: Secondary | ICD-10-CM | POA: Diagnosis present

## 2014-02-13 DIAGNOSIS — F1020 Alcohol dependence, uncomplicated: Secondary | ICD-10-CM | POA: Diagnosis present

## 2014-02-13 DIAGNOSIS — F419 Anxiety disorder, unspecified: Secondary | ICD-10-CM

## 2014-02-13 DIAGNOSIS — I429 Cardiomyopathy, unspecified: Secondary | ICD-10-CM

## 2014-02-13 DIAGNOSIS — K921 Melena: Secondary | ICD-10-CM

## 2014-02-13 DIAGNOSIS — F10939 Alcohol use, unspecified with withdrawal, unspecified: Secondary | ICD-10-CM

## 2014-02-13 DIAGNOSIS — IMO0002 Reserved for concepts with insufficient information to code with codable children: Secondary | ICD-10-CM | POA: Diagnosis not present

## 2014-02-13 DIAGNOSIS — Z885 Allergy status to narcotic agent status: Secondary | ICD-10-CM

## 2014-02-13 DIAGNOSIS — I4901 Ventricular fibrillation: Principal | ICD-10-CM | POA: Diagnosis present

## 2014-02-13 DIAGNOSIS — G8929 Other chronic pain: Secondary | ICD-10-CM | POA: Diagnosis present

## 2014-02-13 DIAGNOSIS — K226 Gastro-esophageal laceration-hemorrhage syndrome: Secondary | ICD-10-CM | POA: Diagnosis present

## 2014-02-13 DIAGNOSIS — Z880 Allergy status to penicillin: Secondary | ICD-10-CM

## 2014-02-13 DIAGNOSIS — E538 Deficiency of other specified B group vitamins: Secondary | ICD-10-CM

## 2014-02-13 DIAGNOSIS — R7309 Other abnormal glucose: Secondary | ICD-10-CM | POA: Diagnosis present

## 2014-02-13 DIAGNOSIS — J9600 Acute respiratory failure, unspecified whether with hypoxia or hypercapnia: Secondary | ICD-10-CM | POA: Diagnosis present

## 2014-02-13 DIAGNOSIS — D649 Anemia, unspecified: Secondary | ICD-10-CM | POA: Diagnosis present

## 2014-02-13 DIAGNOSIS — D696 Thrombocytopenia, unspecified: Secondary | ICD-10-CM | POA: Diagnosis present

## 2014-02-13 DIAGNOSIS — R4020 Unspecified coma: Secondary | ICD-10-CM | POA: Diagnosis not present

## 2014-02-13 DIAGNOSIS — I4581 Long QT syndrome: Secondary | ICD-10-CM | POA: Diagnosis present

## 2014-02-13 DIAGNOSIS — Z6379 Other stressful life events affecting family and household: Secondary | ICD-10-CM

## 2014-02-13 LAB — RAPID URINE DRUG SCREEN, HOSP PERFORMED
Amphetamines: NOT DETECTED
Barbiturates: NOT DETECTED
Benzodiazepines: NOT DETECTED
Cocaine: NOT DETECTED
OPIATES: NOT DETECTED
Tetrahydrocannabinol: NOT DETECTED

## 2014-02-13 LAB — COMPREHENSIVE METABOLIC PANEL
ALT: 50 U/L — AB (ref 0–35)
AST: 384 U/L — ABNORMAL HIGH (ref 0–37)
Albumin: 4.2 g/dL (ref 3.5–5.2)
Alkaline Phosphatase: 171 U/L — ABNORMAL HIGH (ref 39–117)
Anion gap: 30 — ABNORMAL HIGH (ref 5–15)
BUN: 5 mg/dL — ABNORMAL LOW (ref 6–23)
CO2: 19 mEq/L (ref 19–32)
CREATININE: 0.52 mg/dL (ref 0.50–1.10)
Calcium: 7.6 mg/dL — ABNORMAL LOW (ref 8.4–10.5)
Chloride: 92 mEq/L — ABNORMAL LOW (ref 96–112)
GFR calc Af Amer: >90 mL/min (ref >90)
GLUCOSE: 267 mg/dL — AB (ref 70–99)
Potassium: 2.8 mEq/L — CL (ref 3.7–5.3)
SODIUM: 141 meq/L (ref 137–147)
Total Bilirubin: 3.4 mg/dL — ABNORMAL HIGH (ref 0.3–1.2)
Total Protein: 7.1 g/dL (ref 6.0–8.3)

## 2014-02-13 LAB — I-STAT ARTERIAL BLOOD GAS, ED
ACID-BASE EXCESS: 3 mmol/L — AB (ref 0.0–2.0)
Acid-base deficit: 5 mmol/L — ABNORMAL HIGH (ref 0.0–2.0)
BICARBONATE: 19.9 meq/L — AB (ref 20.0–24.0)
Bicarbonate: 26.1 mEq/L — ABNORMAL HIGH (ref 20.0–24.0)
O2 Saturation: 100 %
O2 Saturation: 97 %
PH ART: 7.52 — AB (ref 7.350–7.450)
Patient temperature: 36.9
TCO2: 27 mmol/L (ref 0–100)
TCO2: 21 mmol/L (ref 0–100)
pCO2 arterial: 31.8 mmHg — ABNORMAL LOW (ref 35.0–45.0)
pCO2 arterial: 33.5 mmHg — ABNORMAL LOW (ref 35.0–45.0)
pH, Arterial: 7.381 (ref 7.350–7.450)
pO2, Arterial: 490 mmHg — ABNORMAL HIGH (ref 80.0–100.0)
pO2, Arterial: 86 mmHg (ref 80.0–100.0)

## 2014-02-13 LAB — URINE MICROSCOPIC-ADD ON

## 2014-02-13 LAB — CBC
HCT: 35.4 % — ABNORMAL LOW (ref 36.0–46.0)
HCT: 31.1 % — ABNORMAL LOW (ref 36.0–46.0)
HEMOGLOBIN: 11.7 g/dL — AB (ref 12.0–15.0)
Hemoglobin: 9.9 g/dL — ABNORMAL LOW (ref 12.0–15.0)
MCH: 29.8 pg (ref 26.0–34.0)
MCH: 29.5 pg (ref 26.0–34.0)
MCHC: 33.1 g/dL (ref 30.0–36.0)
MCHC: 31.8 g/dL (ref 30.0–36.0)
MCV: 90.3 fL (ref 78.0–100.0)
MCV: 92.6 fL (ref 78.0–100.0)
PLATELETS: 74 10*3/uL — AB (ref 150–400)
Platelets: 76 10*3/uL — ABNORMAL LOW (ref 150–400)
RBC: 3.92 MIL/uL (ref 3.87–5.11)
RBC: 3.36 MIL/uL — ABNORMAL LOW (ref 3.87–5.11)
RDW: 18 % — ABNORMAL HIGH (ref 11.5–15.5)
RDW: 18.3 % — ABNORMAL HIGH (ref 11.5–15.5)
WBC: 3.5 10*3/uL — ABNORMAL LOW (ref 4.0–10.5)
WBC: 7.6 10*3/uL (ref 4.0–10.5)

## 2014-02-13 LAB — URINALYSIS, ROUTINE W REFLEX MICROSCOPIC
Ketones, ur: 40 mg/dL — AB
LEUKOCYTES UA: NEGATIVE
NITRITE: NEGATIVE
PH: 6.5 (ref 5.0–8.0)
Protein, ur: 100 mg/dL — AB
SPECIFIC GRAVITY, URINE: 1.024 (ref 1.005–1.030)
Urobilinogen, UA: 1 mg/dL (ref 0.0–1.0)

## 2014-02-13 LAB — BASIC METABOLIC PANEL
ANION GAP: 27 — AB (ref 5–15)
BUN: 5 mg/dL — ABNORMAL LOW (ref 6–23)
CO2: 18 meq/L — AB (ref 19–32)
CREATININE: 0.47 mg/dL — AB (ref 0.50–1.10)
Calcium: 7.2 mg/dL — ABNORMAL LOW (ref 8.4–10.5)
Chloride: 96 mEq/L (ref 96–112)
Glucose, Bld: 272 mg/dL — ABNORMAL HIGH (ref 70–99)
Potassium: 3.1 mEq/L — ABNORMAL LOW (ref 3.7–5.3)
SODIUM: 141 meq/L (ref 137–147)

## 2014-02-13 LAB — CREATININE, SERUM
CREATININE: 0.46 mg/dL — AB (ref 0.50–1.10)
GFR calc Af Amer: >90 mL/min (ref >90)
GFR calc non Af Amer: >90 mL/min (ref >90)

## 2014-02-13 LAB — MAGNESIUM
Magnesium: 1 mg/dL — ABNORMAL LOW (ref 1.5–2.5)
Magnesium: 2.4 mg/dL (ref 1.5–2.5)

## 2014-02-13 LAB — I-STAT CHEM 8, ED
CALCIUM ION: 0.83 mmol/L — AB (ref 1.12–1.23)
Chloride: 96 mEq/L (ref 96–112)
Creatinine, Ser: 0.6 mg/dL (ref 0.50–1.10)
GLUCOSE: 285 mg/dL — AB (ref 70–99)
HCT: 41 % (ref 36.0–46.0)
Hemoglobin: 13.9 g/dL (ref 12.0–15.0)
Potassium: 2.5 mEq/L — CL (ref 3.7–5.3)
Sodium: 136 mEq/L — ABNORMAL LOW (ref 137–147)
TCO2: 21 mmol/L (ref 0–100)

## 2014-02-13 LAB — PREGNANCY, URINE: PREG TEST UR: NEGATIVE

## 2014-02-13 LAB — SALICYLATE LEVEL: Salicylate Lvl: <2 mg/dL — ABNORMAL LOW (ref 2.8–20.0)

## 2014-02-13 LAB — AMMONIA: AMMONIA: 73 umol/L — AB (ref 11–60)

## 2014-02-13 LAB — I-STAT TROPONIN, ED: TROPONIN I, POC: 0.05 ng/mL (ref 0.00–0.08)

## 2014-02-13 LAB — ETHANOL

## 2014-02-13 LAB — PHOSPHORUS: Phosphorus: 2.7 mg/dL (ref 2.3–4.6)

## 2014-02-13 LAB — APTT: aPTT: 26 seconds (ref 24–37)

## 2014-02-13 LAB — ACETAMINOPHEN LEVEL: Acetaminophen (Tylenol), Serum: <15 ug/mL (ref 10–30)

## 2014-02-13 LAB — LIPASE, BLOOD: Lipase: 42 U/L (ref 11–59)

## 2014-02-13 LAB — CBG MONITORING, ED: Glucose-Capillary: 256 mg/dL — ABNORMAL HIGH (ref 70–99)

## 2014-02-13 LAB — PROTIME-INR
INR: 1.08 (ref 0.00–1.49)
Prothrombin Time: 14 seconds (ref 11.6–15.2)

## 2014-02-13 LAB — I-STAT CG4 LACTIC ACID, ED: LACTIC ACID, VENOUS: 9.4 mmol/L — AB (ref 0.5–2.2)

## 2014-02-13 MED ORDER — NALOXONE HCL 0.4 MG/ML IJ SOLN
0.4000 mg | Freq: Once | INTRAMUSCULAR | Status: DC
Start: 2014-02-13 — End: 2014-02-13
  Filled 2014-02-13: qty 1

## 2014-02-13 MED ORDER — FENTANYL CITRATE 0.05 MG/ML IJ SOLN: 50.0000 ug | Freq: Once | INTRAMUSCULAR | Status: DC

## 2014-02-13 MED ORDER — POTASSIUM CHLORIDE 10 MEQ/100ML IV SOLN
10.0000 meq | Freq: Once | INTRAVENOUS | Status: AC
  Administered 2014-02-13: 10 meq via INTRAVENOUS
  Filled 2014-02-13: qty 100

## 2014-02-13 MED ORDER — CISATRACURIUM BOLUS VIA INFUSION
0.1000 mg/kg | Freq: Once | INTRAVENOUS | Status: DC
  Filled 2014-02-13: qty 5

## 2014-02-13 MED ORDER — FOLIC ACID 5 MG/ML IJ SOLN
1.0000 mg | Freq: Every day | INTRAMUSCULAR | Status: DC
  Administered 2014-02-14 – 2014-02-18 (×5): 1 mg via INTRAVENOUS
  Filled 2014-02-13 (×7): qty 0.2

## 2014-02-13 MED ORDER — NALOXONE HCL 1 MG/ML IJ SOLN
INTRAMUSCULAR | Status: AC | PRN
  Administered 2014-02-13: 0.4 mg via INTRAMUSCULAR

## 2014-02-13 MED ORDER — MAGNESIUM SULFATE 40 MG/ML IJ SOLN
2.0000 g | Freq: Once | INTRAMUSCULAR | Status: DC
Start: 2014-02-13 — End: 2014-02-13

## 2014-02-13 MED ORDER — LORAZEPAM 2 MG/ML IJ SOLN
INTRAMUSCULAR | Status: AC
  Filled 2014-02-13: qty 1

## 2014-02-13 MED ORDER — PANTOPRAZOLE SODIUM 40 MG IV SOLR
40.0000 mg | Freq: Every day | INTRAVENOUS | Status: DC
  Administered 2014-02-14 – 2014-02-16 (×4): 40 mg via INTRAVENOUS
  Filled 2014-02-13 (×5): qty 40

## 2014-02-13 MED ORDER — LEVETIRACETAM IN NACL 500 MG/100ML IV SOLN
500.0000 mg | Freq: Two times a day (BID) | INTRAVENOUS | Status: DC
  Administered 2014-02-14 – 2014-02-15 (×4): 500 mg via INTRAVENOUS
  Filled 2014-02-13 (×5): qty 100

## 2014-02-13 MED ORDER — SODIUM CHLORIDE 0.9 % IV SOLN: 1.0000 g | Freq: Once | INTRAVENOUS | Status: DC

## 2014-02-13 MED ORDER — THIAMINE HCL 100 MG/ML IJ SOLN
100.0000 mg | Freq: Every day | INTRAMUSCULAR | Status: DC
  Administered 2014-02-14 – 2014-02-18 (×5): 100 mg via INTRAVENOUS
  Filled 2014-02-13 (×6): qty 1

## 2014-02-13 MED ORDER — MIDAZOLAM BOLUS VIA INFUSION
1.0000 mg | INTRAVENOUS | Status: DC | PRN
  Filled 2014-02-13: qty 2

## 2014-02-13 MED ORDER — ARTIFICIAL TEARS OP OINT
1.0000 "application " | TOPICAL_OINTMENT | Freq: Three times a day (TID) | OPHTHALMIC | Status: DC
  Administered 2014-02-14 – 2014-02-15 (×5): 1 via OPHTHALMIC
  Filled 2014-02-13 (×2): qty 3.5

## 2014-02-13 MED ORDER — MAGNESIUM SULFATE 40 MG/ML IJ SOLN
2.0000 g | INTRAMUSCULAR | Status: AC
  Administered 2014-02-13: 2 g via INTRAVENOUS
  Filled 2014-02-13: qty 50

## 2014-02-13 MED ORDER — SODIUM CHLORIDE 0.9 % IV SOLN
0.0000 mg/h | INTRAVENOUS | Status: DC
  Administered 2014-02-13: 7 mg/h via INTRAVENOUS
  Administered 2014-02-13: 2 mg/h via INTRAVENOUS
  Filled 2014-02-13 (×2): qty 10

## 2014-02-13 MED ORDER — LORAZEPAM 2 MG/ML IJ SOLN
2.0000 mg | Freq: Once | INTRAMUSCULAR | Status: AC
  Administered 2014-02-13: 2 mg via INTRAVENOUS

## 2014-02-13 MED ORDER — SODIUM CHLORIDE 0.9 % IV SOLN
250.0000 mL | INTRAVENOUS | Status: DC | PRN
  Administered 2014-02-17: 10 mL/h via INTRAVENOUS
  Administered 2014-02-19: 250 mL via INTRAVENOUS

## 2014-02-13 MED ORDER — SODIUM CHLORIDE 0.9 % IV SOLN
2000.0000 mL | Freq: Once | INTRAVENOUS | Status: AC
  Administered 2014-02-13: 2000 mL via INTRAVENOUS

## 2014-02-13 MED ORDER — HEPARIN SODIUM (PORCINE) 5000 UNIT/ML IJ SOLN
5000.0000 [IU] | Freq: Three times a day (TID) | INTRAMUSCULAR | Status: DC
Start: 2014-02-13 — End: 2014-02-13

## 2014-02-13 MED ORDER — SODIUM CHLORIDE 0.9 % IV SOLN
1.0000 mg/h | INTRAVENOUS | Status: DC
  Administered 2014-02-14: 2 mg/h via INTRAVENOUS
  Administered 2014-02-15: 4 mg/h via INTRAVENOUS
  Filled 2014-02-13 (×3): qty 10

## 2014-02-13 MED ORDER — SODIUM CHLORIDE 0.9 % IV SOLN
0.0000 ug/h | INTRAVENOUS | Status: DC
  Administered 2014-02-13: 50 ug/h via INTRAVENOUS
  Filled 2014-02-13: qty 50

## 2014-02-13 MED ORDER — MIDAZOLAM HCL 2 MG/2ML IJ SOLN
2.0000 mg | INTRAMUSCULAR | Status: DC | PRN
  Administered 2014-02-13: 2 mg via INTRAVENOUS
  Filled 2014-02-13: qty 2

## 2014-02-13 MED ORDER — SODIUM CHLORIDE 0.9 % IV SOLN
0.0000 mg/h | INTRAVENOUS | Status: DC
  Administered 2014-02-13: 5 mg/h via INTRAVENOUS
  Administered 2014-02-13: 2 mg/h via INTRAVENOUS
  Filled 2014-02-13: qty 10

## 2014-02-13 MED ORDER — POTASSIUM CHLORIDE 10 MEQ/100ML IV SOLN
10.0000 meq | INTRAVENOUS | Status: AC
  Administered 2014-02-14 (×4): 10 meq via INTRAVENOUS
  Filled 2014-02-13 (×2): qty 100

## 2014-02-13 MED ORDER — FENTANYL BOLUS VIA INFUSION
50.0000 ug | INTRAVENOUS | Status: DC | PRN
  Filled 2014-02-13: qty 100

## 2014-02-13 MED ORDER — SODIUM CHLORIDE 0.9 % IV SOLN
1.0000 g | INTRAVENOUS | Status: AC
  Administered 2014-02-13: 1 g via INTRAVENOUS
  Filled 2014-02-13: qty 10

## 2014-02-13 MED ORDER — POTASSIUM CHLORIDE 10 MEQ/100ML IV SOLN
10.0000 meq | Freq: Once | INTRAVENOUS | Status: AC
  Administered 2014-02-13: 10 meq via INTRAVENOUS

## 2014-02-13 MED ORDER — SODIUM CHLORIDE 0.9 % IV SOLN
1.0000 ug/kg/min | INTRAVENOUS | Status: DC
  Administered 2014-02-13: 1.5 ug/kg/min via INTRAVENOUS
  Filled 2014-02-13: qty 20

## 2014-02-13 MED ORDER — ASPIRIN 300 MG RE SUPP
300.0000 mg | RECTAL | Status: AC
Start: 2014-02-13 — End: 2014-02-13
  Administered 2014-02-13: 300 mg via RECTAL
  Filled 2014-02-13: qty 1

## 2014-02-13 MED ORDER — POTASSIUM CHLORIDE 20 MEQ/15ML (10%) PO LIQD
40.0000 meq | Freq: Once | ORAL | Status: AC
  Administered 2014-02-14: 40 meq
  Filled 2014-02-13: qty 30

## 2014-02-13 MED ORDER — IOHEXOL 350 MG/ML SOLN
50.0000 mL | Freq: Once | INTRAVENOUS | Status: AC | PRN
  Administered 2014-02-13: 50 mL via INTRAVENOUS

## 2014-02-13 MED ORDER — LORAZEPAM 2 MG/ML IJ SOLN
1.0000 mg | Freq: Once | INTRAMUSCULAR | Status: AC
  Administered 2014-02-13: 1 mg via INTRAVENOUS
  Filled 2014-02-13: qty 1

## 2014-02-13 MED ORDER — FENTANYL CITRATE 0.05 MG/ML IJ SOLN
25.0000 ug/h | INTRAMUSCULAR | Status: DC
  Administered 2014-02-14 (×2): 200 ug/h via INTRAVENOUS
  Administered 2014-02-14: 100 ug/h via INTRAVENOUS
  Administered 2014-02-15: 300 ug/h via INTRAVENOUS
  Administered 2014-02-15: 50 ug/h via INTRAVENOUS
  Administered 2014-02-15: 250 ug/h via INTRAVENOUS
  Administered 2014-02-16: 350 ug/h via INTRAVENOUS
  Filled 2014-02-13 (×6): qty 50

## 2014-02-13 MED ORDER — MAGNESIUM SULFATE 4000MG/100ML IJ SOLN
4.0000 g | Freq: Once | INTRAMUSCULAR | Status: AC
  Administered 2014-02-14: 4 g via INTRAVENOUS
  Filled 2014-02-13: qty 100

## 2014-02-13 MED ORDER — DEXTROSE 5 % IV SOLN
25.0000 mg/kg | INTRAVENOUS | Status: AC
  Administered 2014-02-13: 1248 mg via INTRAVENOUS
  Filled 2014-02-13: qty 12.48

## 2014-02-13 MED ORDER — HEPARIN SODIUM (PORCINE) 5000 UNIT/ML IJ SOLN
5000.0000 [IU] | Freq: Three times a day (TID) | INTRAMUSCULAR | Status: DC
  Administered 2014-02-14 (×2): 5000 [IU] via SUBCUTANEOUS
  Filled 2014-02-13 (×4): qty 1

## 2014-02-13 MED ORDER — DEXTROSE 5 % IV SOLN
0.5000 ug/min | INTRAVENOUS | Status: DC
  Administered 2014-02-14: 2 ug/min via INTRAVENOUS
  Filled 2014-02-13: qty 4

## 2014-02-13 MED ORDER — INSULIN ASPART 100 UNIT/ML ~~LOC~~ SOLN
2.0000 [IU] | SUBCUTANEOUS | Status: DC
  Administered 2014-02-14: 6 [IU] via SUBCUTANEOUS

## 2014-02-13 MED ORDER — SODIUM CHLORIDE 0.9 % IV SOLN
INTRAVENOUS | Status: DC
  Administered 2014-02-14: 01:00:00 via INTRAVENOUS

## 2014-02-13 MED ORDER — INSULIN ASPART 100 UNIT/ML ~~LOC~~ SOLN: 0.0000 [IU] | SUBCUTANEOUS | Status: DC

## 2014-02-13 MED ORDER — CISATRACURIUM BOLUS VIA INFUSION
0.0500 mg/kg | INTRAVENOUS | Status: AC | PRN
Start: 2014-02-13 — End: 2014-02-13
  Administered 2014-02-13: 2.5 mg via INTRAVENOUS
  Filled 2014-02-13: qty 3

## 2014-02-13 NOTE — ED Notes (Signed)
EEG at bedside.

## 2014-02-13 NOTE — ED Notes (Signed)
Total of 8mg  versed give per bolus given from drip.

## 2014-02-13 NOTE — ED Notes (Signed)
Boyfriend at bedside updated by Critical care.

## 2014-02-13 NOTE — ED Notes (Signed)
Patient transported to CT 

## 2014-02-13 NOTE — H&P (Signed)
PULMONARY / CRITICAL CARE MEDICINE   Name: Kristen Roberson MRN: [id number] DOB: 03/19/1976    ADMISSION DATE:  02/13/2014 CONSULTATION DATE:  02/13/2014  REFERRING MD :  EDP  CHIEF COMPLAINT:  Status epilepticus complicated by V.fib arrest  INITIAL PRESENTATION: 38 y.o. F with hx of alcohol abuse brought to ED on 7/27 after she began to have seizure like activity while watching TV at home.  Boyfriend states she began shaking and eyes rolled back into her head.  A few seconds later, she stopped breathing.  He began CPR and dispatched EMS.  In the field, she had a V.fib arrest with ROSC after epi x 1 and 1 shock.  In ED, found to be hypomagnesemic, hypokalemic, hypocalcemic.  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  PCCM consulted for admission and initiation of hypothermia protocol.  STUDIES:  7/27 CTA head >>> NAICP 7/27 Continuous EEG >>>  SIGNIFICANT EVENTS: 7/27 witnessed seizure by pts fiance while watching TV at home, led to v.fib arrest in the field. 7/27 intubated, hypothermia protocol initiated.   HISTORY OF PRESENT ILLNESS:  Pt is encephalopathic; therefore, this HPI is obtained from chart review. Kristen Roberson is a 38 y.o. F with PMH as outlined below including ETOH abuse.  Per her fiance, she had not been feeling well over the past 2 days and has had some sort of GI illness with nausea and vomiting and decreased PO intake.  She has not had any alcohol beverages in past day or 2.  He does however state that prior to this, she had been going through some difficulties at home and had been drinking heavily, roughly 3 - 4 "Four Loco" drinks per day. On evening of 7/28, they were lying in bed watching TV when she suddenly started shaking and "gasping for air".  Her eyes then rolled back and she became apneic.  He attempted to arouse her but was unsuccessful.  He began CPR and called 911.  En route to ED, pt suffered  a V.Fib arrest with ROSC after 1 of epi and 1 shock (2 - 4 minutes).  In ED, there was some concern for status epilepticus.  Neurology was consulted and they questioned myoclonus vs. True status.  Continuous EEG ordered for further eval. PCCM called for admission and initiation of hypothermia protocol.  PAST MEDICAL HISTORY :  Past Medical History  Diagnosis Date  . Proctitis   . Cysts of both ovaries   . Seizures   . Anemia   . Anxiety   . Blood transfusion without reported diagnosis   . Depression   . Fatty liver 10/05/13   Past Surgical History  Procedure Laterality Date  . Ovarian cyst removal    . Laparoscopy N/A 09/28/2013    Procedure: LAPAROSCOPY OPERATIVE;  Surgeon: Terrance Mass, MD;  Location: [facility];  Service: Gynecology;  Laterality: N/A;  . Laparoscopic appendectomy Right 09/28/2013    Procedure: APPENDECTOMY LAPAROSCOPIC;  Surgeon: Terrance Mass, MD;  Location: [facility];  Service: Gynecology;  Laterality: Right;  . Salpingoophorectomy Right 09/28/2013    Procedure: SALPINGO OOPHORECTOMY;  Surgeon: Terrance Mass, MD;  Location: [facility];  Service: Gynecology;  Laterality: Right;  . Colonoscopy N/A 09/30/2013    Procedure: COLONOSCOPY;  Surgeon: Lafayette Dragon, MD;  Location: [facility];  Service: Endoscopy;  Laterality: N/A;  . Esophagogastroduodenoscopy N/A 11/23/2013    Procedure: ESOPHAGOGASTRODUODENOSCOPY (EGD);  Surgeon: Lajuan Lines  Pyrtle, MD;  Location: [facility];  Service: Endoscopy;  Laterality: N/A;  . Appendectomy     Prior to Admission medications   Medication Sig Start Date End Date Taking? Authorizing Provider  acamprosate (CAMPRAL) 333 MG tablet Take 2 tablets (666 mg total) by mouth 3 (three) times daily with meals. 01/09/14  Yes Benjamine Mola, FNP  traZODone (DESYREL) 25 mg TABS tablet Take 150 mg by mouth at bedtime as needed for sleep. 01/09/14  Yes Benjamine Mola, FNP  acetaminophen (TYLENOL) 325 MG tablet Take 650 mg by mouth every 6 (six) hours as  needed for moderate pain.    Historical Provider, MD  FLUoxetine (PROZAC) 20 MG capsule Take 40 mg by mouth daily. 01/09/14   Benjamine Mola, FNP  tetrahydrozoline 0.05 % ophthalmic solution Place 1 drop into both eyes 2 (two) times daily as needed (dry eyes).    Historical Provider, MD   Allergies  Allergen Reactions  . Morphine And Related Anaphylaxis     Tolerated hydromorphone on 11/25/13.   . Tramadol Other (See Comments)    Seizures   . Penicillins Other (See Comments)    Unknown childhood reaction.    FAMILY HISTORY:  Family History  Problem Relation Age of Onset  . Diabetes Mother   . Hyperlipidemia Mother   . Stroke Mother   . Diabetes Father    SOCIAL HISTORY:  reports that she has never smoked. She has never used smokeless tobacco. She reports that she drinks alcohol. She reports that she does not use illicit drugs.  REVIEW OF SYSTEMS:  Unable to obtain as pt is encephalopathic.  SUBJECTIVE:   VITAL SIGNS: Temp:  [95.9 F (35.5 C)-98.2 F (36.8 C)] 98.2 F (36.8 C) (07/28 2240) Pulse Rate:  [66-152] 129 (07/28 2305) Resp:  [16-30] 28 (07/28 2305) BP: (102-150)/(85-103) 146/103 mmHg (07/28 2305) SpO2:  [81 %-100 %] 100 % (07/28 2305) FiO2 (%):  [40 %-100 %] 40 % (07/28 2305) Weight:  [49.9 kg (110 lb 0.2 oz)] 49.9 kg (110 lb 0.2 oz) (07/28 1900) HEMODYNAMICS:   VENTILATOR SETTINGS: Vent Mode:  [-] PRVC FiO2 (%):  [40 %-100 %] 40 % Set Rate:  [14 bmp] 14 bmp Vt Set:  [450 mL] 450 mL PEEP:  [5 cmH20] 5 cmH20 Plateau Pressure:  [12 cmH20] 12 cmH20 INTAKE / OUTPUT: Intake/Output   None     PHYSICAL EXAMINATION: General: WDWN female, sedated. Neuro: Sedated. + clonus in bilateral lower extremities. HEENT: [place]/AT. PERRL, sclerae anicteric. Cardiovascular: RRR, no M/R/G.  Lungs: Coarse rhonchi throughout.  On vent. Abdomen: BS x 4, soft, NT/ND.  Musculoskeletal: No gross deformities, no edema.  Skin: Intact, warm, no rashes.  LABS:  CBC  Recent  Labs Lab 02/08/14 1635 02/13/14 2050 02/13/14 2057  WBC 1.9* 3.5*  --   HGB 10.6* 11.7* 13.9  HCT 32.0* 35.4* 41.0  PLT 96* 76*  --    Coag's No results found for this basename: APTT, INR,  in the last 168 hours BMET  Recent Labs Lab 02/08/14 1635 02/13/14 2050 02/13/14 2057  NA 145 141 136*  K 3.0* 2.8* 2.5*  CL 102 92* 96  CO2 25 19  --   BUN 7 5* <3*  CREATININE 0.37* 0.52 0.60  GLUCOSE 120* 267* 285*   Electrolytes  Recent Labs Lab 02/08/14 1635 02/13/14 2050  CALCIUM 8.2* 7.6*  MG  --  1.0*   Sepsis Markers  Recent Labs Lab 02/13/14 2057  LATICACIDVEN 9.40*  ABG  Recent Labs Lab 02/13/14 2105  PHART 7.520*  PCO2ART 31.8*  PO2ART 490.0*   Liver Enzymes  Recent Labs Lab 02/13/14 2050  AST 384*  ALT 50*  ALKPHOS 171*  BILITOT 3.4*  ALBUMIN 4.2   Cardiac Enzymes No results found for this basename: TROPONINI, PROBNP,  in the last 168 hours Glucose  Recent Labs Lab 02/13/14 2304  GLUCAP 256*    Imaging Ct Angio Head W/cm &/or Wo Cm  02/13/2014   CLINICAL DATA:  Unresponsive.  EXAM: CT ANGIOGRAPHY HEAD  TECHNIQUE: Multidetector CT imaging of the head was performed using the standard protocol during bolus administration of intravenous contrast. Multiplanar CT image reconstructions and MIPs were obtained to evaluate the vascular anatomy.  CONTRAST:  60mL OMNIPAQUE IOHEXOL 350 MG/ML SOLN  COMPARISON:  None.  FINDINGS: CT head: The ventricles and sulci are normal. No intraparenchymal hemorrhage, mass effect nor midline shift. No acute large vascular territory infarcts. Postcontrast imaging demonstrates no abnormal parenchymal nor leptomeningeal enhancement.  No abnormal extra-axial fluid collections. Basal cisterns are patent.  No skull fracture. The included ocular globes and orbital contents are non-suspicious. The mastoid aircells and included paranasal sinuses are well-aerated. Life support lines in place, seen on the localizer.  CTA head:  Anterior circulation: Normal appearance of the cervical internal carotid arteries, petrous, cavernous and supra clinoid internal carotid arteries. Widely patent anterior communicating artery. Normal appearance of the anterior and middle cerebral arteries.  Posterior circulation: Left vertebral artery is dominant, with normal appearance of the vertebral arteries, vertebrobasilar junction and basilar artery, as well as main branch vessels. Small bilateral posterior communicating arteries are present. Normal appearance of the posterior cerebral arteries.  No large vessel occlusion, hemodynamically significant stenosis, dissection, luminal irregularity, contrast extravasation or aneurysm within the anterior nor posterior circulation.  Review of the MIP images confirms the above findings.  IMPRESSION: CT head: No acute intracranial process, normal examination. No abnormal enhancement.  CTA head:  No acute vascular process, normal examination.   Electronically Signed   By: Elon Alas   On: 02/13/2014 22:28   Dg Chest Port 1 View  02/13/2014   CLINICAL DATA:  Post CPR  EXAM: PORTABLE CHEST - 1 VIEW  COMPARISON:  Prior radiograph from 12/01/2013  FINDINGS: Defibrillator pads overlie the thorax. Patient is intubated with the tip of the endotracheal tube approximately 1 cm above the carina. Mediastinal silhouette within normal limits.  The lungs are normally inflated. No airspace consolidation, pleural effusion, or pulmonary edema is identified. There is no pneumothorax.  No acute osseous abnormality identified.  IMPRESSION: 1. Tip of endotracheal tube somewhat low lying just above the carina. Retraction by 2 cm could be considered to avoid slippage into the mainstem bronchi. 2. No acute cardiopulmonary abnormality identified. Results were called by telephone at the time of interpretation on 02/13/2014 at 9:13 pm to Dr. Claudean Severance , who verbally acknowledged these results.   Electronically Signed   By: Jeannine Boga M.D.   On: 02/13/2014 21:13     ASSESSMENT / PLAN:  PULMONARY OETT 7/27 A: VDRF - s/p V.fib arrest. P:   - Full mechanical support, wean as able. - VAP bundle. - ABG and CXR in AM.  CARDIOVASCULAR CVL 7/27 A:  V.Fib arrest -likely related to hypomag/K P:  - Insert CVL. - Hypothermia protocol. - Goal MAP > 85. - Trend troponin / lactate. - TTE -cards consult if pos trops or low EF on echo  RENAL A:  Hypokalemia Hypomagnesemia Hypocalcemia AG acidosis - lactate P:   - NS @ 75. - Replete K - 40 PO & 6 runs, Mg - 6g Iv, Ca - 1 g . - BMP q2hrs while on hypothermia protocol.  GASTROINTESTINAL A:   Nutrition GI prophylaxis Elevated bilirubin P:   - NPO. - Nutrition consult for TF's. - SUP:  Pantoprazole. - Check direct bili.  HEMATOLOGIC A:   VTE Prophylaxis Anemia - chronic. P:  - SCD's / Heparin. - Transfuse for Hgb < 8. - CBC in AM.  INFECTIOUS A:   No evidence of infection P:   - Monitor clinically.  ENDOCRINE A:   Hyperglycemia   P:   - ICU hyperglycemia protocol.  NEUROLOGIC A:   Acute encephalopathy ETOH abuse ? Seizure's vs myoclonus P:   - RASS goal: - 5. - Sedation:  Versed / Fentanyl / Nimbex. - Hold WUA until after rewarmed. - Thiamine / Folate. - Continuous EEG shows flat line on 10mg /h  Versed -hence decreased to 4mg /h. - Neuro following, appreciate their assistance in titrating versed.   TODAY'S SUMMARY: 38 y.o. F with questionable seizure like activity complicated by V.Fib arrest out of hospital.  Hypomagnesemic, Hypokalemic, Hypocalcemic.  Electrolytes repleted.  Hypothermia protocol initiated.  Neuro consulted for continuous EEG to r/o status epilepticus.   Montey Hora, [facility] Pgr: [phone]  or [phone]  I have personally obtained a history, examined the patient, evaluated laboratory and imaging results, formulated the assessment and plan and  placed orders. CRITICAL CARE: The patient is critically ill with multiple organ systems failure and requires high complexity decision making for assessment and support, frequent evaluation and titration of therapies, application of advanced monitoring technologies and extensive interpretation of multiple databases. Critical Care Time devoted to patient care services described in this note is 60 minutes.    Kara Mead MD. Shade Flood. [facility] Pager [phone] If no response call [phone]  02/13/14 11:55 PM

## 2014-02-13 NOTE — ED Notes (Signed)
Pt actively seizing MD aware.

## 2014-02-13 NOTE — ED Notes (Signed)
MD at bedside. Kirkpatrick  

## 2014-02-13 NOTE — Progress Notes (Signed)
Chaplain responded to ED call for post CPR, offered support to pt's fiance sitting outside trauma bay. He had no emotional needs at this time, but asked where the Eula Fried was. Chaplain provided emotional support, hospitality, and presence. He is aware of chaplain services and availability, chaplain available for follow up. Please page.   Ethelene Browns [phone]

## 2014-02-13 NOTE — ED Notes (Signed)
Critical care and Kirkpatrick at bedside. Continued one on one care

## 2014-02-13 NOTE — ED Notes (Signed)
Arctic sun purged. Patient being transported upstairs to 2900

## 2014-02-13 NOTE — ED Notes (Signed)
Witnessed seizure to cardiac arrest/ cpr per boyfriend. Ems arrive continued cpr. Epi x1/ vib noted on monitor Defibx 1 continued cpr for 2-4 minutes ROSC at that time. Pt localizes to sternal rub on arrival. Boyfriend states pt detox from etoh for 24 hours.

## 2014-02-13 NOTE — ED Notes (Signed)
Pt continues to questionable seizure like activity. Critical Care and Neurology at bedside

## 2014-02-13 NOTE — Consult Note (Signed)
Neurology Consultation Reason for Consult: Seizure Referring Physician: Tawnya Crook, M  CC: Seizure  History is obtained from:medical record  HPI: Kristen Roberson is a 38 y.o. female with a history of alcohol abuse who apparently had some sort of GI illness recently with nausea and vomiting limiting her ability to drink over the past day to 2 days. She has a history of alcohol withdrawal seizures in the past.  Tonight, she had several jerking movements and then became unresponsive. The boyfriend did CPR and EMS found that she was in VFib arrest. She was resuscitated with ROSC. She had increased tone with UE flexion and clonic moevments which were treated as status epilepticus with 8mg  IV versed, 5mg  IV ativan and starting of a versed drip at 10mg /hr. She was also loaded with depakote.   She was admitted to the hospital with Mallory-Weiss tears in may been started on PPIs. It is not clear to me if the emesis recently was bloody, but her hemoglobin is exiting significantly higher than previous hospitalizations, including 5 days ago.  She was seen several days ago and was hypokalemic at that time.   ROS:  Unable to obtain due to altered mental status.   Past Medical History  Diagnosis Date  . Proctitis   . Cysts of both ovaries   . Seizures   . Anemia   . Anxiety   . Blood transfusion without reported diagnosis   . Depression   . Fatty liver 10/05/13    Family History: Unable to assess secondary to patient's altered mental status.    Social History: Tob: Unable to assess secondary to patient's altered mental status.    Exam: Current vital signs: BP 102/86  Pulse 123  Resp 21  Ht 5' 6.14" (1.68 m)  Wt 49.9 kg (110 lb 0.2 oz)  BMI 17.68 kg/m2  SpO2 100% Vital signs in last 24 hours: Pulse Rate:  [123-130] 123 (07/28 2100) Resp:  [21-25] 21 (07/28 2115) BP: (102-119)/(86-92) 102/86 mmHg (07/28 2115) SpO2:  [100 %] 100 % (07/28 2110) FiO2 (%):  [40 %-100 %] 40 % (07/28  2110) Weight:  [49.9 kg (110 lb 0.2 oz)] 49.9 kg (110 lb 0.2 oz) (07/28 1900)  General: in bed, intubated CV: RRR Mental Status: Patient is comatose Does not follow commands or answer questions.  Cranial Nerves: II: Does not blink to threat. Pupils are equal, round, and reactive to light.  Discs are difficult to visualize. III,IV, VI: eyes midline, c-collar in place V, VII: corneals absent bilaterally  VIII, X, XI, XII: Unable to assess secondary to patient's altered mental status.  Motor: Flexion to noxious stimuli in bilateral upper extremities, no repsonse bilateral lowers Sensory: As above Deep Tendon Reflexes: 2+ and symmetric in the biceps and patellae.  Cerebellar: Unable to assess secondary to patient's altered mental status.   Gait: Unable to assess secondary to patient's altered mental status.    I have reviewed labs in epic and the results pertinent to this consultation are: Low k and calcium.   I have reviewed the images obtained: CT head - no acute findigns  Impression: 38 yo F with status epilepticus complicated by vfib arrest. CPR x 5 minutes with ROSC. She continues to have movements concerning for seizure activity. With hypokalemia, hypomag and calcemia, I m concerned that this played a role in her presentation. It is possible that the movements seen were muscular hyperexcitability(clonus) rather than seizure.   She was loaded with depakote, though for longer term therapy,  I would favor keppra. She had a complete suppression pattern on EEG, likely secondary to medication, no signs of ongoing electrographic seizure. If the initial events did represent seizure, I certainly think that with electrolyte abnormalities coupled with etoh withdrawal, this would be an explanation for a provoked seizure.    Recommendations: 1) Keppra 500mg  BID 2) wean sedation 3) continuous EEG.  4) electrolyte repletion per CCM 5) will continue to follow.     This patient is  critically ill and at significant risk of neurological worsening, death and care requires constant monitoring of vital signs, hemodynamics,respiratory and cardiac monitoring, neurological assessment, discussion with family, other specialists and medical decision making of high complexity. I spent 60 minutes of neurocritical care time  in the care of  this patient.  Roland Rack, MD [facility] [phone]  If 7pm- 7am, please page neurology on call as listed in [place]. 02/13/2014  11:58 PM

## 2014-02-13 NOTE — ED Notes (Signed)
Pt continues to have rigid upper extremities

## 2014-02-14 ENCOUNTER — Inpatient Hospital Stay (HOSPITAL_COMMUNITY): Payer: Self-pay

## 2014-02-14 ENCOUNTER — Inpatient Hospital Stay (HOSPITAL_COMMUNITY): Payer: MEDICAID

## 2014-02-14 DIAGNOSIS — R9431 Abnormal electrocardiogram [ECG] [EKG]: Secondary | ICD-10-CM

## 2014-02-14 DIAGNOSIS — R569 Unspecified convulsions: Secondary | ICD-10-CM | POA: Diagnosis present

## 2014-02-14 DIAGNOSIS — D62 Acute posthemorrhagic anemia: Secondary | ICD-10-CM

## 2014-02-14 DIAGNOSIS — I059 Rheumatic mitral valve disease, unspecified: Secondary | ICD-10-CM

## 2014-02-14 DIAGNOSIS — E876 Hypokalemia: Secondary | ICD-10-CM

## 2014-02-14 DIAGNOSIS — I428 Other cardiomyopathies: Secondary | ICD-10-CM

## 2014-02-14 LAB — MAGNESIUM
MAGNESIUM: 1.6 mg/dL (ref 1.5–2.5)
Magnesium: 2.7 mg/dL — ABNORMAL HIGH (ref 1.5–2.5)
Magnesium: 2.4 mg/dL (ref 1.5–2.5)
Magnesium: 1.9 mg/dL (ref 1.5–2.5)
Magnesium: 1.8 mg/dL (ref 1.5–2.5)
Magnesium: 2.1 mg/dL (ref 1.5–2.5)

## 2014-02-14 LAB — BASIC METABOLIC PANEL
ANION GAP: 18 — AB (ref 5–15)
ANION GAP: 18 — AB (ref 5–15)
Anion gap: 18 — ABNORMAL HIGH (ref 5–15)
Anion gap: 13 (ref 5–15)
BUN: 3 mg/dL — AB (ref 6–23)
BUN: 3 mg/dL — AB (ref 6–23)
BUN: 3 mg/dL — AB (ref 6–23)
CALCIUM: 6.4 mg/dL — AB (ref 8.4–10.5)
CALCIUM: 7.1 mg/dL — AB (ref 8.4–10.5)
CO2: 20 mEq/L (ref 19–32)
CO2: 19 mEq/L (ref 19–32)
CO2: 19 mEq/L (ref 19–32)
CO2: 19 mEq/L (ref 19–32)
CREATININE: 0.32 mg/dL — AB (ref 0.50–1.10)
CREATININE: 0.3 mg/dL — AB (ref 0.50–1.10)
CREATININE: 0.27 mg/dL — AB (ref 0.50–1.10)
CREATININE: 0.23 mg/dL — AB (ref 0.50–1.10)
Calcium: 5.9 mg/dL — CL (ref 8.4–10.5)
Calcium: 6.7 mg/dL — ABNORMAL LOW (ref 8.4–10.5)
Chloride: 104 mEq/L (ref 96–112)
Chloride: 105 mEq/L (ref 96–112)
Chloride: 105 mEq/L (ref 96–112)
Chloride: 109 mEq/L (ref 96–112)
GFR calc non Af Amer: >90 mL/min (ref >90)
GFR calc non Af Amer: >90 mL/min (ref >90)
Glucose, Bld: 127 mg/dL — ABNORMAL HIGH (ref 70–99)
Glucose, Bld: 130 mg/dL — ABNORMAL HIGH (ref 70–99)
Glucose, Bld: 117 mg/dL — ABNORMAL HIGH (ref 70–99)
Glucose, Bld: 86 mg/dL (ref 70–99)
POTASSIUM: 3.5 meq/L — AB (ref 3.7–5.3)
Potassium: 3.8 mEq/L (ref 3.7–5.3)
Potassium: 3 mEq/L — ABNORMAL LOW (ref 3.7–5.3)
Potassium: 3 mEq/L — ABNORMAL LOW (ref 3.7–5.3)
Sodium: 142 mEq/L (ref 137–147)
Sodium: 142 mEq/L (ref 137–147)
Sodium: 142 mEq/L (ref 137–147)
Sodium: 141 mEq/L (ref 137–147)

## 2014-02-14 LAB — MRSA PCR SCREENING: MRSA BY PCR: NEGATIVE

## 2014-02-14 LAB — POCT I-STAT, CHEM 8
BUN: <3 mg/dL — ABNORMAL LOW (ref 6–23)
BUN: <3 mg/dL — ABNORMAL LOW (ref 6–23)
CHLORIDE: 105 meq/L (ref 96–112)
Calcium, Ion: 0.87 mmol/L — ABNORMAL LOW (ref 1.12–1.23)
Calcium, Ion: 0.88 mmol/L — ABNORMAL LOW (ref 1.12–1.23)
Calcium, Ion: 0.89 mmol/L — ABNORMAL LOW (ref 1.12–1.23)
Chloride: 103 mEq/L (ref 96–112)
Chloride: 104 mEq/L (ref 96–112)
Creatinine, Ser: 0.3 mg/dL — ABNORMAL LOW (ref 0.50–1.10)
Creatinine, Ser: 0.3 mg/dL — ABNORMAL LOW (ref 0.50–1.10)
Creatinine, Ser: 0.3 mg/dL — ABNORMAL LOW (ref 0.50–1.10)
Glucose, Bld: 160 mg/dL — ABNORMAL HIGH (ref 70–99)
Glucose, Bld: 131 mg/dL — ABNORMAL HIGH (ref 70–99)
Glucose, Bld: 119 mg/dL — ABNORMAL HIGH (ref 70–99)
HCT: 33 % — ABNORMAL LOW (ref 36.0–46.0)
HEMATOCRIT: 37 % (ref 36.0–46.0)
HEMATOCRIT: 35 % — AB (ref 36.0–46.0)
HEMOGLOBIN: 12.6 g/dL (ref 12.0–15.0)
Hemoglobin: 11.9 g/dL — ABNORMAL LOW (ref 12.0–15.0)
Hemoglobin: 11.2 g/dL — ABNORMAL LOW (ref 12.0–15.0)
POTASSIUM: 2.4 meq/L — AB (ref 3.7–5.3)
POTASSIUM: 3.5 meq/L — AB (ref 3.7–5.3)
Potassium: 3.3 mEq/L — ABNORMAL LOW (ref 3.7–5.3)
SODIUM: 141 meq/L (ref 137–147)
SODIUM: 142 meq/L (ref 137–147)
Sodium: 142 mEq/L (ref 137–147)
TCO2: 18 mmol/L (ref 0–100)
TCO2: 19 mmol/L (ref 0–100)
TCO2: 18 mmol/L (ref 0–100)

## 2014-02-14 LAB — BLOOD GAS, ARTERIAL
ACID-BASE DEFICIT: 8.9 mmol/L — AB (ref 0.0–2.0)
Bicarbonate: 17.1 mEq/L — ABNORMAL LOW (ref 20.0–24.0)
FIO2: 0.4 %
O2 Saturation: 99.5 %
PEEP: 5 cmH2O
PH ART: 7.296 — AB (ref 7.350–7.450)
Patient temperature: 91.4
RATE: 14 resp/min
TCO2: 18.3 mmol/L (ref 0–100)
VT: 450 mL
pCO2 arterial: 33.9 mmHg — ABNORMAL LOW (ref 35.0–45.0)
pO2, Arterial: 130 mmHg — ABNORMAL HIGH (ref 80.0–100.0)

## 2014-02-14 LAB — PROTIME-INR
INR: 1.14 (ref 0.00–1.49)
INR: 1.16 (ref 0.00–1.49)
PROTHROMBIN TIME: 14.8 s (ref 11.6–15.2)
Prothrombin Time: 14.6 seconds (ref 11.6–15.2)

## 2014-02-14 LAB — GLUCOSE, CAPILLARY
GLUCOSE-CAPILLARY: 106 mg/dL — AB (ref 70–99)
GLUCOSE-CAPILLARY: 145 mg/dL — AB (ref 70–99)
GLUCOSE-CAPILLARY: 171 mg/dL — AB (ref 70–99)
GLUCOSE-CAPILLARY: 189 mg/dL — AB (ref 70–99)
GLUCOSE-CAPILLARY: 219 mg/dL — AB (ref 70–99)
GLUCOSE-CAPILLARY: 71 mg/dL (ref 70–99)
Glucose-Capillary: 124 mg/dL — ABNORMAL HIGH (ref 70–99)
Glucose-Capillary: 169 mg/dL — ABNORMAL HIGH (ref 70–99)
Glucose-Capillary: 197 mg/dL — ABNORMAL HIGH (ref 70–99)
Glucose-Capillary: 242 mg/dL — ABNORMAL HIGH (ref 70–99)
Glucose-Capillary: 203 mg/dL — ABNORMAL HIGH (ref 70–99)
Glucose-Capillary: 219 mg/dL — ABNORMAL HIGH (ref 70–99)
Glucose-Capillary: 112 mg/dL — ABNORMAL HIGH (ref 70–99)
Glucose-Capillary: 134 mg/dL — ABNORMAL HIGH (ref 70–99)
Glucose-Capillary: 142 mg/dL — ABNORMAL HIGH (ref 70–99)
Glucose-Capillary: 156 mg/dL — ABNORMAL HIGH (ref 70–99)
Glucose-Capillary: 138 mg/dL — ABNORMAL HIGH (ref 70–99)
Glucose-Capillary: 156 mg/dL — ABNORMAL HIGH (ref 70–99)
Glucose-Capillary: 117 mg/dL — ABNORMAL HIGH (ref 70–99)

## 2014-02-14 LAB — CARBOXYHEMOGLOBIN
Carboxyhemoglobin: 1.5 % (ref 0.5–1.5)
Carboxyhemoglobin: 1.4 % (ref 0.5–1.5)
METHEMOGLOBIN: 0.5 % (ref 0.0–1.5)
Methemoglobin: 0.6 % (ref 0.0–1.5)
O2 SAT: 73.8 %
O2 Saturation: 100 %
TOTAL HEMOGLOBIN: 9.9 g/dL — AB (ref 12.0–16.0)
Total hemoglobin: 9.1 g/dL — ABNORMAL LOW (ref 12.0–16.0)

## 2014-02-14 LAB — CBC
HCT: 30.2 % — ABNORMAL LOW (ref 36.0–46.0)
HEMOGLOBIN: 9.8 g/dL — AB (ref 12.0–15.0)
MCH: 29.7 pg (ref 26.0–34.0)
MCHC: 32.5 g/dL (ref 30.0–36.0)
MCV: 91.5 fL (ref 78.0–100.0)
Platelets: 66 10*3/uL — ABNORMAL LOW (ref 150–400)
RBC: 3.3 MIL/uL — ABNORMAL LOW (ref 3.87–5.11)
RDW: 17.9 % — ABNORMAL HIGH (ref 11.5–15.5)
WBC: 6 10*3/uL (ref 4.0–10.5)

## 2014-02-14 LAB — LACTIC ACID, PLASMA
Lactic Acid, Venous: 8.6 mmol/L — ABNORMAL HIGH (ref 0.5–2.2)
Lactic Acid, Venous: 4.7 mmol/L — ABNORMAL HIGH (ref 0.5–2.2)
Lactic Acid, Venous: 0.9 mmol/L (ref 0.5–2.2)

## 2014-02-14 LAB — DIC (DISSEMINATED INTRAVASCULAR COAGULATION) PANEL
APTT: 31 s (ref 24–37)
D DIMER QUANT: 0.83 ug{FEU}/mL — AB (ref 0.00–0.48)
FIBRINOGEN: 154 mg/dL — AB (ref 204–475)
INR: 1.21 (ref 0.00–1.49)
PLATELETS: 91 10*3/uL — AB (ref 150–400)

## 2014-02-14 LAB — APTT
aPTT: 26 seconds (ref 24–37)
aPTT: 33 seconds (ref 24–37)

## 2014-02-14 LAB — HCG, QUANTITATIVE, PREGNANCY: hCG, Beta Chain, Quant, S: <1 m[IU]/mL (ref <5)

## 2014-02-14 LAB — BILIRUBIN, DIRECT: Bilirubin, Direct: 0.6 mg/dL — ABNORMAL HIGH (ref 0.0–0.3)

## 2014-02-14 LAB — DIC (DISSEMINATED INTRAVASCULAR COAGULATION)PANEL
Prothrombin Time: 15.3 seconds — ABNORMAL HIGH (ref 11.6–15.2)
Smear Review: NONE SEEN

## 2014-02-14 MED ORDER — SODIUM CHLORIDE 0.9 % IV BOLUS (SEPSIS)
500.0000 mL | Freq: Once | INTRAVENOUS | Status: AC
  Administered 2014-02-14: 500 mL via INTRAVENOUS

## 2014-02-14 MED ORDER — CETYLPYRIDINIUM CHLORIDE 0.05 % MT LIQD
7.0000 mL | Freq: Four times a day (QID) | OROMUCOSAL | Status: DC
  Administered 2014-02-14 – 2014-02-16 (×9): 7 mL via OROMUCOSAL
  Filled 2014-02-14 (×7): qty 7

## 2014-02-14 MED ORDER — POTASSIUM CHLORIDE 20 MEQ/15ML (10%) PO LIQD
40.0000 meq | ORAL | Status: AC
  Administered 2014-02-14 (×2): 40 meq
  Filled 2014-02-14 (×2): qty 30

## 2014-02-14 MED ORDER — POTASSIUM CHLORIDE 10 MEQ/50ML IV SOLN
10.0000 meq | INTRAVENOUS | Status: DC
Start: 2014-02-14 — End: 2014-02-14

## 2014-02-14 MED ORDER — PERFLUTREN LIPID MICROSPHERE
1.0000 mL | INTRAVENOUS | Status: AC | PRN
  Administered 2014-02-14: 2 mL via INTRAVENOUS
  Filled 2014-02-14: qty 10

## 2014-02-14 MED ORDER — BIOTENE DRY MOUTH MT LIQD: 15.0000 mL | Freq: Four times a day (QID) | OROMUCOSAL | Status: DC

## 2014-02-14 MED ORDER — MAGNESIUM SULFATE 40 MG/ML IJ SOLN
2.0000 g | Freq: Once | INTRAMUSCULAR | Status: AC
  Administered 2014-02-14: 2 g via INTRAVENOUS
  Filled 2014-02-14: qty 50

## 2014-02-14 MED ORDER — CHLORHEXIDINE GLUCONATE 0.12 % MT SOLN: 15.0000 mL | Freq: Two times a day (BID) | OROMUCOSAL | Status: DC

## 2014-02-14 MED ORDER — SODIUM CHLORIDE 0.9 % IV SOLN
1.0000 g | Freq: Once | INTRAVENOUS | Status: AC
  Administered 2014-02-14: 1 g via INTRAVENOUS
  Filled 2014-02-14: qty 10

## 2014-02-14 MED ORDER — POTASSIUM CHLORIDE 10 MEQ/50ML IV SOLN
10.0000 meq | INTRAVENOUS | Status: AC
  Administered 2014-02-14 (×2): 10 meq via INTRAVENOUS
  Filled 2014-02-14 (×2): qty 50

## 2014-02-14 MED ORDER — CETYLPYRIDINIUM CHLORIDE 0.05 % MT LIQD
7.0000 mL | Freq: Four times a day (QID) | OROMUCOSAL | Status: DC
  Filled 2014-02-14 (×4): qty 7

## 2014-02-14 MED ORDER — CHLORHEXIDINE GLUCONATE 0.12 % MT SOLN
15.0000 mL | Freq: Two times a day (BID) | OROMUCOSAL | Status: DC
  Administered 2014-02-15 – 2014-02-18 (×7): 15 mL via OROMUCOSAL
  Filled 2014-02-14 (×8): qty 15

## 2014-02-14 MED ORDER — INSULIN ASPART 100 UNIT/ML ~~LOC~~ SOLN
2.0000 [IU] | SUBCUTANEOUS | Status: DC
  Administered 2014-02-14: 2 [IU] via SUBCUTANEOUS

## 2014-02-14 MED ORDER — NOREPINEPHRINE BITARTRATE 1 MG/ML IV SOLN
0.5000 ug/min | INTRAVENOUS | Status: DC
  Administered 2014-02-14: 20 ug/min via INTRAVENOUS
  Filled 2014-02-14 (×4): qty 16

## 2014-02-14 MED ORDER — CHLORHEXIDINE GLUCONATE 0.12 % MT SOLN
15.0000 mL | Freq: Two times a day (BID) | OROMUCOSAL | Status: DC
  Administered 2014-02-14: 15 mL via OROMUCOSAL
  Filled 2014-02-14: qty 15

## 2014-02-14 MED ORDER — SODIUM CHLORIDE 0.9 % IV SOLN
INTRAVENOUS | Status: DC
  Administered 2014-02-14: 1.4 [IU]/h via INTRAVENOUS
  Filled 2014-02-14: qty 1

## 2014-02-14 MED ORDER — INSULIN GLARGINE 100 UNIT/ML ~~LOC~~ SOLN
10.0000 [IU] | SUBCUTANEOUS | Status: DC
  Administered 2014-02-14: 10 [IU] via SUBCUTANEOUS
  Filled 2014-02-14 (×4): qty 0.1

## 2014-02-14 MED ORDER — PERFLUTREN LIPID MICROSPHERE
INTRAVENOUS | Status: AC
  Administered 2014-02-14: 2 mL
  Filled 2014-02-14: qty 10

## 2014-02-14 NOTE — Progress Notes (Signed)
INITIAL NUTRITION ASSESSMENT  DOCUMENTATION CODES Per approved criteria  -Non-severe (moderate) malnutrition in the context of chronic illness   Pt meets criteria for moderate MALNUTRITION in the context of chronic illness as evidenced by mild muscle depletion, 17.9% wt loss x 3 months.  INTERVENTION: -When pt rewarmed, initiate Vital AF 1.2 @ 25 ml/hr via OGT and increase by 10 ml every 12 hours to goal rate of 45 ml/hr.   Tube feeding regimen provides 1296 kcal (92% of needs), 81 grams of protein, and 875 ml of H2O.   -RD to follow for plan of care  NUTRITION DIAGNOSIS: Inadequate oral intake related to inability to eat as evidenced by NPO.   Goal: Pt will meet >90% of estimated nutritional needs  Monitor:  Hypothermia protocol status, respiratory status, TF initiation/tolerance, labs, weight changes, I/O's  Reason for Assessment: Consult for TF, VDRF  38 y.o. female  Admitting Dx: <principal problem not specified>  ASSESSMENT: Pt with hx of alcohol abuse brought to ED on 7/27 after she began to have seizure like activity while watching TV at home. Boyfriend states she began shaking and eyes rolled back into her head. A few seconds later, she stopped breathing. He began CPR and dispatched EMS. In the field, she had a V.fib arrest with ROSC after epi x 1 and 1 shock. In ED, found to be hypomagnesemic, hypokalemic, hypocalcemic. 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 PCCM consulted for admission and initiation of hypothermia protocol.  Patient is currently intubated on ventilator support. OGT in place. MV: 9.1 L/min Temp (24hrs), Avg:93.5 F (34.2 C), Min:91 F (32.8 C), Max:98.2 F (36.8 C)  Pt has transitioned to fentanyl drip. Per RN, pt has not rewarmed yet. Remains on hypothermia protocol.  Wt hx reveals progressive wt loss over the past 5 months, including a 17.9% wt loss x 3 months.   Labs reviewed. K: 3.5, with improvement, received runs of KCL this AM. Na, Mg, and Phos WNL. Glucose: 127. CBGs trending up- 106-156. Hgb A1c WNL.    Nutrition Focused Physical Exam:  Subcutaneous Fat:  Orbital Region: WDL Upper Arm Region: WDL Thoracic and Lumbar Region: WDL  Muscle:  Temple Region: mild depletion Clavicle Bone Region: WDL Clavicle and Acromion Bone Region: mild depletion Scapular Bone Region: WDL Dorsal Hand: WDL Patellar Region: WDL Anterior Thigh Region: n/a Posterior Calf Region: n/a  Edema: none present  Height: Ht Readings from Last 1 Encounters:  02/13/14 5' 6.14" (1.68 m)    Weight: Wt Readings from Last 1 Encounters:  02/14/14 110 lb 3.7 oz (50 kg)    Ideal Body Weight: 130#  % Ideal Body Weight: 85%  Wt Readings from Last 10 Encounters:  02/14/14 110 lb 3.7 oz (50 kg)  01/29/14 110 lb (49.896 kg)  01/15/14 103 lb (46.72 kg)  01/13/14 112 lb (50.803 kg)  01/06/14 112 lb (50.803 kg)  01/05/14 105 lb (47.628 kg)  12/09/13 120 lb (54.432 kg)  11/30/13 120 lb (54.432 kg)  11/28/13 120 lb (54.432 kg)  11/25/13 123 lb 4.8 oz (55.929 kg)    Usual Body Weight: 130#  % Usual Body Weight: 85%  BMI:  Body mass index is 17.72 kg/(m^2). Underweight  Estimated Nutritional Needs (using 37 degrees C): Kcal: 1402.6 Protein: 65-75 grams Fluid: >1.4 L  Skin: WDL  Diet Order: NPO  EDUCATION NEEDS: -Education not appropriate at this time   Intake/Output Summary (Last 24 hours) at  02/14/14 1355 Last data filed at 02/14/14 1300  Gross per 24 hour  Intake 3924.36 ml  Output   3385 ml  Net 539.36 ml    Last BM: PTA  Labs:   Recent Labs Lab 02/13/14 2253  02/14/14 0435 02/14/14 0525 02/14/14 0900 02/14/14 1212  NA 141  < > 142  --  142 142  K 3.1*  < > 3.3*  --  3.5* 3.8  CL 96  < > 105  --  104 105  CO2 18*  --   --   --  20 19  BUN 5*  < > <3*  --  3* 3*  CREATININE 0.47*  0.46*  < > 0.30*  --  0.32* 0.30*  CALCIUM  7.2*  --   --   --  5.9* 6.7*  MG 2.4  < >  --  2.4 1.9 1.8  PHOS 2.7  --   --   --   --   --   GLUCOSE 272*  < > 119*  --  127* 130*  < > = values in this interval not displayed.  CBG (last 3)   Recent Labs  02/14/14 1115 02/14/14 1210 02/14/14 1317  GLUCAP 106* 142* 156*   Lab Results  Component Value Date   HGBA1C 4.4 12/09/2013    Scheduled Meds: . antiseptic oral rinse  7 mL Mouth Rinse QID  . artificial tears  1 application Both Eyes 3 times per day  . [START ON 02/15/2014] chlorhexidine  15 mL Mouth Rinse BID  . cisatracurium  0.1 mg/kg Intravenous Once  . folic acid  1 mg Intravenous Daily  . levETIRAcetam  500 mg Intravenous BID  . pantoprazole (PROTONIX) IV  40 mg Intravenous QHS  . thiamine  100 mg Intravenous Daily    Continuous Infusions: . sodium chloride 75 mL/hr at 02/14/14 1124  . cisatracurium (NIMBEX) infusion 1.5 mcg/kg/min (02/14/14 0700)  . fentaNYL infusion INTRAVENOUS 100 mcg/hr (02/14/14 1315)  . insulin (NOVOLIN-R) infusion Stopped (02/14/14 1100)  . midazolam (VERSED) infusion 2 mg/hr (02/14/14 0700)  . norepinephrine (LEVOPHED) Adult infusion 8 mcg/min (02/14/14 1300)    Past Medical History  Diagnosis Date  . Proctitis   . Cysts of both ovaries   . Seizures   . Anemia   . Anxiety   . Blood transfusion without reported diagnosis   . Depression   . Fatty liver 10/05/13    Past Surgical History  Procedure Laterality Date  . Ovarian cyst removal    . Laparoscopy N/A 09/28/2013    Procedure: LAPAROSCOPY OPERATIVE;  Surgeon: Terrance Mass, MD;  Location: [facility];  Service: Gynecology;  Laterality: N/A;  . Laparoscopic appendectomy Right 09/28/2013    Procedure: APPENDECTOMY LAPAROSCOPIC;  Surgeon: Terrance Mass, MD;  Location: [facility];  Service: Gynecology;  Laterality: Right;  . Salpingoophorectomy Right 09/28/2013    Procedure: SALPINGO OOPHORECTOMY;  Surgeon: Terrance Mass, MD;  Location: [facility];  Service: Gynecology;  Laterality:  Right;  . Colonoscopy N/A 09/30/2013    Procedure: COLONOSCOPY;  Surgeon: Lafayette Dragon, MD;  Location: [facility];  Service: Endoscopy;  Laterality: N/A;  . Esophagogastroduodenoscopy N/A 11/23/2013    Procedure: ESOPHAGOGASTRODUODENOSCOPY (EGD);  Surgeon: Jerene Bears, MD;  Location: [facility];  Service: Endoscopy;  Laterality: N/A;  . Appendectomy      Prairie Stenberg A. Jimmye Norman, RD, LDN Pager: [phone] After hours Pager: [phone]

## 2014-02-14 NOTE — Progress Notes (Signed)
LTVM set up; started at 2330 02/13/14.

## 2014-02-14 NOTE — Procedures (Signed)
Central Venous Catheter Insertion Procedure Note KEYLEIGH MANNINEN [id number] 1976-07-06  Procedure: Insertion of Central Venous Catheter Indications: Assessment of intravascular volume, Drug and/or fluid administration and Frequent blood sampling  Procedure Details Consent: Unable to obtain consent because of altered level of consciousness. Time Out: Verified patient identification, verified procedure, site/side was marked, verified correct patient position, special equipment/implants available, medications/allergies/relevent history reviewed, required imaging and test results available.  Performed  Maximum sterile technique was used including antiseptics, cap, gloves, gown, hand hygiene, mask and sheet. Skin prep: Chlorhexidine; local anesthetic administered A antimicrobial bonded/coated triple lumen catheter was placed in the right internal jugular vein using the Seldinger technique.  Evaluation Blood flow good Complications: No apparent complications Patient did tolerate procedure well. Chest X-ray ordered to verify placement.  CXR: pending.     Montey Hora, [facility] Pgr: [phone]  or [phone] Procedure performed under direct ultrasound guidance for real time vessel cannulation under my guidance.  Rigoberto Noel MD

## 2014-02-14 NOTE — Progress Notes (Signed)
RT Note: ETT pulled back 2 cm to 23 cm @ the lip per radiology via t/o by RN.

## 2014-02-14 NOTE — Progress Notes (Signed)
uUtilization review completed

## 2014-02-14 NOTE — Progress Notes (Signed)
CRITICAL VALUE ALERT  Critical value received:  Calcium 5.9   Date of notification:  02/14/14  Time of notification:  1000  Critical value read back:Yes.    Nurse who received alert:  Doran Clay, RN  MD aware of low Ca before this result came up. Ca IV ordered to be given. Will give and then redraw with BMet.

## 2014-02-14 NOTE — Procedures (Signed)
Arterial Catheter Insertion Procedure Note Kristen Roberson [id number] 1976-04-29  Procedure: Insertion of Arterial Catheter  Indications: Blood pressure monitoring and Frequent blood sampling  Procedure Details Consent: Unable to obtain consent because of altered level of consciousness. Time Out: Verified patient identification, verified procedure, site/side was marked, verified correct patient position, special equipment/implants available, medications/allergies/relevent history reviewed, required imaging and test results available.  Performed  Maximum sterile technique was used including antiseptics, cap, gloves, gown, hand hygiene, mask and sheet. Skin prep: Chlorhexidine; local anesthetic administered 20 gauge catheter was inserted into right radial artery using the Seldinger technique.  Evaluation Blood flow good; BP tracing good. Complications: No apparent complications.   Lamona Curl [facility] 02/14/2014

## 2014-02-14 NOTE — Progress Notes (Signed)
Fiance showed up at bedside at [facility] person to make decisions would be pt's legal husband, Jamela Cumbo. Pt's divorce is not yet finalized. Fiance was only able to give pt's husband's e-mail address. E-mail is [email or web address].

## 2014-02-14 NOTE — Progress Notes (Signed)
[facility] Progress Note Patient Name: Kristen Roberson DOB: 03-26-1976 MRN: [id number]  Date of Service  02/14/2014   HPI/Events of Note   Potassium 3.3.  eICU Interventions  Will give IV KCL 10 meq x two.   Intervention Category Major Interventions: Other:  Teddy Pena 02/14/2014, 6:16 AM

## 2014-02-14 NOTE — Progress Notes (Signed)
Echocardiogram 2D Echocardiogram with Definity has been performed.  Kristen Roberson 02/14/2014, 2:02 PM

## 2014-02-14 NOTE — Progress Notes (Signed)
Restarted LTM, day 2.

## 2014-02-14 NOTE — Procedures (Signed)
Indication:  This is a 38 yo female with unresponsiveness after cardiac arrest.  She had some myoclonus witnessed and VEEG study is to rule out any clinical or subclinical seizure.  The patient was sedated on mechanical ventilator support._.  Medications include: Ativan, Fentanyl, Nimbex, Versed, Levophed, Magnesium sulfate, KCL, and Kepra.  Technique and Protocol: This is a standard 18-channel VEEG study utilizing international 10/20 system with both bipolar and monopolar montages.  Video source and pushbutton were available.  The study was performed at [facility].  Description of Findings: The VEEG study was started at 11:26 am on  07/28 and ended at 8:00 am on 07/29.  Total 8.5-hour EEG study was scanned in its entity.  Video source was reviewed.  The baseline EEG background is severely suppressed, below 4-5 mV in amplitude throughout the VEEG study.  At time, a near isoelectric activity is noted.  There is lack of spontaneous or secondary EEG background reactivity.  No generalized or focal epileptifrom discharges.  No clinical or subclinical seizures captured.   Impression: This is a severely abnormal VEEG monitoring study.  It shows the presence of a severe nonspecific cerebral dysfunction.  The VEEG findings are consistent with a severe metabolic/toxic/anoxic encephalopathy, verse a sedation state.  A medication effects is in the differential diagnosis considering the fact of sedative agents on board. Clinical correlation is strongly advised.  There are no clinical or subclinical seizures captured.  Consider continuing VEEG study if clinically indicated.

## 2014-02-14 NOTE — Progress Notes (Signed)
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 F with hx of alcohol abuse brought to ED on 7/27 after she began to have seizure like activity while watching TV at home.  Boyfriend states she began shaking and eyes rolled back into her head.  A few seconds later, she stopped breathing.  He began CPR and dispatched EMS.  In the field, she had a V.fib arrest with ROSC after epi x 1 and 1 shock.  In ED, found to be hypomagnesemic, hypokalemic, hypocalcemic.  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  PCCM consulted for admission and initiation of hypothermia protocol.  STUDIES:  7/27 CTA head >>> NAICP 7/27 Continuous EEG >>>  SIGNIFICANT EVENTS: 7/27 witnessed seizure by pts fiance while watching TV at home, led to v.fib arrest in the field. 7/27 intubated, hypothermia protocol initiated.   SUBJECTIVE:  No acute events  VITAL SIGNS: Temp:  [91 F (32.8 C)-98.2 F (36.8 C)] 91.2 F (32.9 C) (07/29 0800) Pulse Rate:  [66-152] 86 (07/29 0800) Resp:  [12-30] 16 (07/29 0800) BP: (102-159)/(67-117) 103/72 mmHg (07/29 0800) SpO2:  [81 %-100 %] 100 % (07/29 0800) Arterial Line BP: (93-130)/(61-95) 112/82 mmHg (07/29 0800) FiO2 (%):  [40 %-100 %] 40 % (07/29 0800) Weight:  [49.9 kg (110 lb 0.2 oz)-50 kg (110 lb 3.7 oz)] 50 kg (110 lb 3.7 oz) (07/29 0415) HEMODYNAMICS: CVP:  [0 mmHg-6 mmHg] 4 mmHg VENTILATOR SETTINGS: Vent Mode:  [-] PRVC FiO2 (%):  [40 %-100 %] 40 % Set Rate:  [14 bmp] 14 bmp Vt Set:  [450 mL] 450 mL PEEP:  [5 cmH20] 5 cmH20 Plateau Pressure:  [12 cmH20-15 cmH20] 13 cmH20 INTAKE / OUTPUT: Intake/Output     07/28 0701 - 07/29 0700 07/29 0701 - 07/30 0700   I.V. (mL/kg)  1936.1 (38.7) 183.1 (3.7)   IV Piggyback 550 50   Total Intake(mL/kg) 2486.1 (49.7) 233.1 (4.7)   Urine (mL/kg/hr) 3100 100 (1.1)   Total Output 3100 100   Net -613.9 +133.1          PHYSICAL EXAMINATION:  Gen: sedated paralyzed on vent HEENT: NCAT, PERRL, ETT in place PULM: CTA B CV: RRR, exam limited by pads AB: limited by pads Ext: warm, no edema, no clubbing, no cyanosis Derm: no obvious bruising Neuro sedated and paralyzed on vent   LABS:  CBC  Recent Labs Lab 02/13/14 2050  02/13/14 2253  02/14/14 0343 02/14/14 0435 02/14/14 0455  WBC 3.5*  --  7.6  --   --   --  6.0  HGB 11.7*  < > 9.9*  < > 11.9* 11.2* 9.8*  HCT 35.4*  < > 31.1*  < > 35.0* 33.0* 30.2*  PLT 76*  --  74*  --   --   --  66*  < > = values in this interval not displayed. Coag's  Recent Labs Lab 02/13/14 2253 02/14/14 0140  APTT 26 26  INR 1.08 1.14   BMET  Recent Labs Lab 02/08/14 1635 02/13/14 2050  02/13/14 2253 02/14/14 0243 02/14/14 0343 02/14/14 0435  NA 145 141  < > 141 141 142 142  K 3.0* 2.8*  < > 3.1* 2.4* 3.5* 3.3*  CL 102 92*  < > 96 103 104 105  CO2 25 19  --  18*  --   --   --   BUN 7 5*  < > 5* <3* <3* <3*  CREATININE 0.37* 0.52  < > 0.47*  0.46* 0.30* 0.30* 0.30*  GLUCOSE 120* 267*  < > 272* 160* 131* 119*  < > = values in this interval not displayed. Electrolytes  Recent Labs Lab 02/08/14 1635  02/13/14 2050 02/13/14 2253 02/14/14 0043 02/14/14 0525  CALCIUM 8.2*  --  7.6* 7.2*  --   --   MG  --   < > 1.0* 2.4 2.7* 2.4  PHOS  --   --   --  2.7  --   --   < > = values in this interval not displayed. Sepsis Markers  Recent Labs Lab 02/13/14 2057 02/14/14 0311  LATICACIDVEN 9.40* 8.6*   ABG  Recent Labs Lab 02/13/14 2105 02/13/14 2321 02/14/14 0257  PHART 7.520* 7.381 7.296*  PCO2ART 31.8* 33.5* 33.9*  PO2ART 490.0* 86.0 130.0*   Liver Enzymes  Recent Labs Lab 02/13/14 2050  AST 384*  ALT 50*  ALKPHOS 171*  BILITOT 3.4*  ALBUMIN  4.2   Cardiac Enzymes No results found for this basename: TROPONINI, PROBNP,  in the last 168 hours Glucose  Recent Labs Lab 02/14/14 0241 02/14/14 0303 02/14/14 0409 02/14/14 0506 02/14/14 0618 02/14/14 0702  GLUCAP 171* 169* 124* 145* 203* 219*    Imaging Ct Angio Head W/cm &/or Wo Cm  02/13/2014   CLINICAL DATA:  Unresponsive.  EXAM: CT ANGIOGRAPHY HEAD  TECHNIQUE: Multidetector CT imaging of the head was performed using the standard protocol during bolus administration of intravenous contrast. Multiplanar CT image reconstructions and MIPs were obtained to evaluate the vascular anatomy.  CONTRAST:  83mL OMNIPAQUE IOHEXOL 350 MG/ML SOLN  COMPARISON:  None.  FINDINGS: CT head: The ventricles and sulci are normal. No intraparenchymal hemorrhage, mass effect nor midline shift. No acute large vascular territory infarcts. Postcontrast imaging demonstrates no abnormal parenchymal nor leptomeningeal enhancement.  No abnormal extra-axial fluid collections. Basal cisterns are patent.  No skull fracture. The included ocular globes and orbital contents are non-suspicious. The mastoid aircells and included paranasal sinuses are well-aerated. Life support lines in place, seen on the localizer.  CTA head: Anterior circulation: Normal appearance of the cervical internal carotid arteries, petrous, cavernous and supra clinoid internal carotid arteries. Widely patent anterior communicating artery. Normal appearance of the anterior and middle cerebral arteries.  Posterior circulation: Left vertebral artery is dominant, with normal appearance of the vertebral arteries, vertebrobasilar junction and basilar artery, as well as main branch vessels. Small bilateral posterior communicating arteries are present. Normal appearance of the posterior cerebral arteries.  No large vessel occlusion, hemodynamically significant stenosis, dissection, luminal irregularity, contrast extravasation or aneurysm within the anterior nor  posterior circulation.  Review of the MIP images confirms the above findings.  IMPRESSION: CT head: No acute intracranial process, normal examination. No abnormal enhancement.  CTA head:  No acute vascular process, normal examination.   Electronically Signed   By: Elon Alas   On: 02/13/2014 22:28   Dg Chest Port 1 View  02/14/2014   CLINICAL DATA:  Central line placement.  EXAM: PORTABLE CHEST - 1 VIEW  COMPARISON:  02/13/2014  FINDINGS: Interval placement of a right central venous catheter with tip over the cavoatrial junction. No pneumothorax. Endotracheal tube appears to the advanced slightly with tip now the origin  of the right mainstem bronchus. New placement of an enteric tube. The tip is off the field of view but below the left hemidiaphragm. Normal heart size and pulmonary vascularity. Lungs are clear and expanded.  IMPRESSION: Right central venous catheter tip localizes over the cavoatrial junction. No pneumothorax. Endotracheal tube tip now appears to reside in the origin of the right mainstem bronchus.  These results were called by telephone at the time of interpretation on 02/14/2014 at 1:12 am to the patient's nurse, Thayer, who verbally acknowledged these results.   Electronically Signed   By: Lucienne Capers M.D.   On: 02/14/2014 01:14   Dg Chest Port 1 View  02/13/2014   CLINICAL DATA:  Post CPR  EXAM: PORTABLE CHEST - 1 VIEW  COMPARISON:  Prior radiograph from 12/01/2013  FINDINGS: Defibrillator pads overlie the thorax. Patient is intubated with the tip of the endotracheal tube approximately 1 cm above the carina. Mediastinal silhouette within normal limits.  The lungs are normally inflated. No airspace consolidation, pleural effusion, or pulmonary edema is identified. There is no pneumothorax.  No acute osseous abnormality identified.  IMPRESSION: 1. Tip of endotracheal tube somewhat low lying just above the carina. Retraction by 2 cm could be considered to avoid slippage into the  mainstem bronchi. 2. No acute cardiopulmonary abnormality identified. Results were called by telephone at the time of interpretation on 02/13/2014 at 9:13 pm to Dr. Claudean Severance , who verbally acknowledged these results.   Electronically Signed   By: Jeannine Boga M.D.   On: 02/13/2014 21:13     ASSESSMENT / PLAN:  PULMONARY OETT 7/28 > A: Acute respiratory failure due to VFib arrest P:   Full mechanical support, increase RR to 20 for acidosis VAP bundle. ABG and CXR daily  CARDIOVASCULAR R IJ CVL 7/28 > R Radial aline 7/28 > A:  V.Fib arrest -likely related to hypomag/K Shock > cardiogenic? P:  Hypothermia protocol. Goal MAP > 85. Trend troponin / lactate. F/U TTE SvO2 Bolus 500cc saline now  RENAL A:   Hypokalemia Hypomagnesemia Hypocalcemia > presumably related to EtOH abuse, multiple recent visits with this Lactic acidosis > due to seizure?  P:   NS @ 75. Keep K > 4, Mg > 2 Monitor and replete Calcium BMP q2hrs while on hypothermia protocol.  GASTROINTESTINAL A:   Nutrition GI prophylaxis Elevated bilirubin P:   Nutrition consult for TF's. SUP:  Pantoprazole. Consider CT abdomen if lactic acid still elevated  HEMATOLOGIC A:   VTE Prophylaxis Anemia - chronic. Thrombocytopenia > source uncertain, due to ETOH?  P:  SCD's / DC Heparin.  DIC panel Transfuse for Hgb < 7. CBC q shift.  INFECTIOUS A:   No evidence of infection P:   Monitor clinically.  ENDOCRINE A:   Hyperglycemia   P:   ICU hyperglycemia protocol.  NEUROLOGIC A:   Acute encephalopathy ETOH abuse ? Seizure's vs myoclonus  P:   RASS goal: - 5. Sedation:  Versed / Fentanyl / Nimbex. Hold WUA until after rewarmed. Thiamine / Folate. Continue home Keppra Continuous EEG shows flat line on 10mg /h  Versed -hence decreased to 4mg /h. Neuro following, appreciate their assistance in titrating versed.  OB/GYN:  A: chronic vaginal bleeding, recent miscarriage? Per history  but [facility] visit [facility] ED > HCG neg P: Transvaginal ultrasound   TODAY'S SUMMARY: 38 y/o female s/p VFib arrest after likely EtOH withdrawal seizure in setting of hypokalemia, hypomagnesemia and hypocalcemia.  Arctic sun protocol, persistent lactic acidosis.  Family: none available  I have personally obtained a history, examined the patient, evaluated laboratory and imaging results, formulated the assessment and plan and placed orders. CRITICAL CARE: The patient is critically ill with multiple organ systems failure and requires high complexity decision making for assessment and support, frequent evaluation and titration of therapies, application of advanced monitoring technologies and extensive interpretation of multiple databases. Critical Care Time devoted to patient care services described in this note is 45 minutes.   Roselie Awkward, MD [place] [facility] Pager: [phone] Cell: [phone] If no response, call [phone]

## 2014-02-14 NOTE — Progress Notes (Signed)
CARDIOLOGY CONSULT NOTE   Patient ID: SHAIANN MCMANAMON MRN: [id number], DOB/AGE: [birth year]   Admit date: 02/13/2014 Date of Consult: 02/14/2014   Primary Physician: Angelica Chessman, MD Primary Cardiologist: New   Pt. Profile  38 year old female with past medical history significant for alcohol abuse and chronic pelvic pain of unknown etiology suffered a witnessed V. fib arrest while watching TV with her boyfriend. CPR was initiated by the boyfriend. EMS noted ventricular fibrillation and she was intubated. On arrival, she was noted to have severe hypokalemia, hypomagnesemia, and hypocalcemia. Cardiology was consulted to rule out cardiogenic source for arrest.  Problem List  Past Medical History  Diagnosis Date  . Proctitis   . Cysts of both ovaries   . Seizures   . Anemia   . Anxiety   . Blood transfusion without reported diagnosis   . Depression   . Fatty liver 10/05/13    Past Surgical History  Procedure Laterality Date  . Ovarian cyst removal    . Laparoscopy N/A 09/28/2013    Procedure: LAPAROSCOPY OPERATIVE;  Surgeon: Terrance Mass, MD;  Location: [facility];  Service: Gynecology;  Laterality: N/A;  . Laparoscopic appendectomy Right 09/28/2013    Procedure: APPENDECTOMY LAPAROSCOPIC;  Surgeon: Terrance Mass, MD;  Location: [facility];  Service: Gynecology;  Laterality: Right;  . Salpingoophorectomy Right 09/28/2013    Procedure: SALPINGO OOPHORECTOMY;  Surgeon: Terrance Mass, MD;  Location: [facility];  Service: Gynecology;  Laterality: Right;  . Colonoscopy N/A 09/30/2013    Procedure: COLONOSCOPY;  Surgeon: Lafayette Dragon, MD;  Location: [facility];  Service: Endoscopy;  Laterality: N/A;  . Esophagogastroduodenoscopy N/A 11/23/2013    Procedure: ESOPHAGOGASTRODUODENOSCOPY (EGD);  Surgeon: Jerene Bears, MD;  Location: [facility];  Service: Endoscopy;  Laterality: N/A;  . Appendectomy       Allergies  Allergies  Allergen Reactions  . Morphine And Related Anaphylaxis   Tolerated hydromorphone on 11/25/13.   . Tramadol Other (See Comments)    Seizures   . Penicillins Other (See Comments)    Unknown childhood reaction.    HPI   The patient is a young 38 year old Caucasian female with past medical history of anemia, seizure and alcohol abuse who had a witnessed cardiac arrest on the night of 02/13/2014. She is currently undergoing therapeutic hypothermia and has been intubated. Majority of the history was obtained from medical record as no family member was present. According to medical record, patient was seen at ED on 01/06/2014 for vomiting and abdominal pain. She was noted to have workup by OB/GYN previously for chronic pelvic pain which has been going on for the past 6 months. OB/GYN recommended her to talk to a gastroenterologist for further outpatient workup. She ws also noted to have some seizure-like episodes. She was drinking on a daily basis per ED note at that time. She went back to the hospital on 01/24/2014 for worsening abdominal discomfort. Again she was seen by OB/GYN at the time and noted that she had a pelvic ultrasound on 12/22/2013 which was normal. She also reported having some vaginal bleeding at the time. Patient was eventually discharged on the same day with pain medication and recommended GI followup.  Apparently, patient was watching TV with her boyfriend on 7/28 when she started shaking and her eyes rolled backward. She stopped breathing shortly after. Boyfriend called EMS and began CPR. Upon arrival of EMS, it was noted she had a V. fib arrest. ROSC was achieved after one round of  epinephrine and a single shock. On arrival to [facility] ED, she was found to have significant electrolyte abnormalities include potassium of 2.8, magnesium of 1.0, and calcium of 7.6. Lactic acid was greater than 4. EKG showed sinus tachycardia with heart rate in the 130s, diffuse ST changes, however also had ST depression in the inferior leads. CT and CTA of the head was  negative for acute process. Therapeutic hypothermia was initiated by pulmonary critical care. Neurology was consulted as the patient had suspicious monoclonal activity prior to cardiac arrest concerning for status epilepticus. VEEG study was obtained on 02/14/2014. Per report, patient had a severely abnormal EEG monitoring study showing the presence of severe nonspecific cerebral dysfunction consistent with severe metabolic/toxic/anoxic encephalopathy versus sedation state. She had no further epileptiform activity on EEG. Echocardiogram was obtained and is currently pending. Cardiology was consulted for potential cardiogenic arrest.   Of note, patient is currently undergoing therapeutic hypothermia. She's also on norepinephrine to allow mean arterial pressure greater than 85. Per nursing staff, patient has been stable overnight without significant arrhythmia. Her norepinephrine has decreased from 20 mcg per minute to 8 mcg per minute. She has a DIC panel pending given lowered platelet and recent vaginal bleeding and concern for thrombosis.    Inpatient Medications  . antiseptic oral rinse  7 mL Mouth Rinse QID  . artificial tears  1 application Both Eyes 3 times per day  . [START ON 02/15/2014] chlorhexidine  15 mL Mouth Rinse BID  . cisatracurium  0.1 mg/kg Intravenous Once  . folic acid  1 mg Intravenous Daily  . levETIRAcetam  500 mg Intravenous BID  . pantoprazole (PROTONIX) IV  40 mg Intravenous QHS  . perflutren lipid microspheres (DEFINITY) IV suspension      . thiamine  100 mg Intravenous Daily    Family History Family History  Problem Relation Age of Onset  . Diabetes Mother   . Hyperlipidemia Mother   . Stroke Mother   . Diabetes Father      Social History History   Social History  . Marital Status: Legally Separated    Spouse Name: N/A    Number of Children: N/A  . Years of Education: N/A   Occupational History  . Not on file.   Social History Main Topics  . Smoking  status: Never Smoker   . Smokeless tobacco: Never Used  . Alcohol Use: Yes  . Drug Use: No  . Sexual Activity: Not on file   Other Topics Concern  . Not on file   Social History Narrative  . No narrative on file     Review of Systems  Unable to obtain due to clinical condition (intubated, sedated and undergoing therapeutic hypothermia)  Physical Exam  Blood pressure 118/79, pulse 71, temperature 91.9 F (33.3 C), temperature source Core (Comment), resp. rate 20, height 5' 6.14" (1.68 m), weight 110 lb 3.7 oz (50 kg), SpO2 100.00%, unknown if currently breastfeeding.  General: Intubated and sedated Psych: unable to assess Neuro: unable to assess, sedated and intubated HEENT: external appearance normal, R IJ inplace  Neck: Supple without bruits or JVD. Lungs:  Resp regular and unlabored, anterior exam CTA. Heart: RRR no s3, s4, or murmurs. Abdomen: soft, unable to assess bowel sound with hypothermic wrap Extremities: Cold extremity with hypothermia, 2+ radial pulse bilaterally, 1+ bilateral DP pulse  Labs  No results found for this basename: CKTOTAL, CKMB, TROPONINI,  in the last 72 hours Lab Results  Component Value Date  WBC 6.0 02/14/2014   HGB 9.8* 02/14/2014   HCT 30.2* 02/14/2014   MCV 91.5 02/14/2014   PLT 91* 02/14/2014    Recent Labs Lab 02/13/14 2050  02/14/14 1212  NA 141  < > 142  K 2.8*  < > 3.8  CL 92*  < > 105  CO2 19  < > 19  BUN 5*  < > 3*  CREATININE 0.52  < > 0.30*  CALCIUM 7.6*  < > 6.7*  PROT 7.1  --   --   BILITOT 3.4*  --   --   ALKPHOS 171*  --   --   ALT 50*  --   --   AST 384*  --   --   GLUCOSE 267*  < > 130*  < > = values in this interval not displayed. No results found for this basename: CHOL, HDL, LDLCALC, TRIG   Lab Results  Component Value Date   DDIMER 0.83* 02/14/2014    Radiology/Studies  Ct Angio Head W/cm &/or Wo Cm  02/13/2014   CLINICAL DATA:  Unresponsive.  EXAM: CT ANGIOGRAPHY HEAD  TECHNIQUE: Multidetector CT  imaging of the head was performed using the standard protocol during bolus administration of intravenous contrast. Multiplanar CT image reconstructions and MIPs were obtained to evaluate the vascular anatomy.  CONTRAST:  60mL OMNIPAQUE IOHEXOL 350 MG/ML SOLN  COMPARISON:  None.  FINDINGS: CT head: The ventricles and sulci are normal. No intraparenchymal hemorrhage, mass effect nor midline shift. No acute large vascular territory infarcts. Postcontrast imaging demonstrates no abnormal parenchymal nor leptomeningeal enhancement.  No abnormal extra-axial fluid collections. Basal cisterns are patent.  No skull fracture. The included ocular globes and orbital contents are non-suspicious. The mastoid aircells and included paranasal sinuses are well-aerated. Life support lines in place, seen on the localizer.  CTA head: Anterior circulation: Normal appearance of the cervical internal carotid arteries, petrous, cavernous and supra clinoid internal carotid arteries. Widely patent anterior communicating artery. Normal appearance of the anterior and middle cerebral arteries.  Posterior circulation: Left vertebral artery is dominant, with normal appearance of the vertebral arteries, vertebrobasilar junction and basilar artery, as well as main branch vessels. Small bilateral posterior communicating arteries are present. Normal appearance of the posterior cerebral arteries.  No large vessel occlusion, hemodynamically significant stenosis, dissection, luminal irregularity, contrast extravasation or aneurysm within the anterior nor posterior circulation.  Review of the MIP images confirms the above findings.  IMPRESSION: CT head: No acute intracranial process, normal examination. No abnormal enhancement.  CTA head:  No acute vascular process, normal examination.   Electronically Signed   By: Elon Alas   On: 02/13/2014 22:28   Dg Ankle Complete Left  02/08/2014   CLINICAL DATA:  Left ankle pain and discoloration for 1  day.  EXAM: LEFT ANKLE COMPLETE - 3+ VIEW  COMPARISON:  None.  FINDINGS: No acute bony or joint abnormality is identified. Small osteophyte off the dorsal aspect of the proximal navicular bone is noted. Soft tissue structures appear normal.  IMPRESSION: Negative exam.   Electronically Signed   By: Inge Rise M.D.   On: 02/08/2014 16:20   Dg Chest Port 1 View  02/14/2014   CLINICAL DATA:  Central line placement.  EXAM: PORTABLE CHEST - 1 VIEW  COMPARISON:  02/13/2014  FINDINGS: Interval placement of a right central venous catheter with tip over the cavoatrial junction. No pneumothorax. Endotracheal tube appears to the advanced slightly with tip now the origin of the  right mainstem bronchus. New placement of an enteric tube. The tip is off the field of view but below the left hemidiaphragm. Normal heart size and pulmonary vascularity. Lungs are clear and expanded.  IMPRESSION: Right central venous catheter tip localizes over the cavoatrial junction. No pneumothorax. Endotracheal tube tip now appears to reside in the origin of the right mainstem bronchus.  These results were called by telephone at the time of interpretation on 02/14/2014 at 1:12 am to the patient's nurse, [place], who verbally acknowledged these results.   Electronically Signed   By: Lucienne Capers M.D.   On: 02/14/2014 01:14   Dg Chest Port 1 View  02/13/2014   CLINICAL DATA:  Post CPR  EXAM: PORTABLE CHEST - 1 VIEW  COMPARISON:  Prior radiograph from 12/01/2013  FINDINGS: Defibrillator pads overlie the thorax. Patient is intubated with the tip of the endotracheal tube approximately 1 cm above the carina. Mediastinal silhouette within normal limits.  The lungs are normally inflated. No airspace consolidation, pleural effusion, or pulmonary edema is identified. There is no pneumothorax.  No acute osseous abnormality identified.  IMPRESSION: 1. Tip of endotracheal tube somewhat low lying just above the carina. Retraction by 2 cm could be  considered to avoid slippage into the mainstem bronchi. 2. No acute cardiopulmonary abnormality identified. Results were called by telephone at the time of interpretation on 02/13/2014 at 9:13 pm to Dr. Claudean Severance , who verbally acknowledged these results.   Electronically Signed   By: Jeannine Boga M.D.   On: 02/13/2014 21:13   Dg Hand Complete Left  01/29/2014   CLINICAL DATA:  Traumatic injury and pain  EXAM: LEFT HAND - COMPLETE 3+ VIEW  COMPARISON:  None.  FINDINGS: Mild degenerate changes are noted at the first metacarpophalangeal joint. No acute fracture or dislocation is seen. No gross soft tissue abnormality is noted.  IMPRESSION: Degenerative change without acute abnormality.   Electronically Signed   By: Inez Catalina M.D.   On: 01/29/2014 12:39    ECG  Sinus tach with HR 130s, diffuse ST changes, ST depression more noticeable in inferior lead  Telemetry  Sinus tach in 130s last night. Has bigeminy around midnight last night. ?ventricular tach vs artifact around 00:39am (likely artifact). NSR with HR 70-80s this morning.  ASSESSMENT AND PLAN  1. Witnessed v-fib arrest  - in the presence of severe electrolyte abnormality (low K, Mg and Ca), arrest likely related to severe electrolyte abnormality. Keep K >4.0 and Mg >2.0.    - intubated, sedated, undergoing therapeutic hypothermia since 1am 7/29  - pending echo result, assess LV function and wall motion  - if EF low, start ASA, can potentially recheck echo after off intubation and neurologically stable  - if high suspicion, can consider cath once regain mental status  - correct electrolyte, watch to alcohol withdraw   - wean levophed as tolerated  2. Prolonged QTc  - daily AM EKG, both prozac and trazadone can cause further prolongation. QTc 540 this am.  3. Status epilecticus - neurology following  - on continuous EEG  4. H/o alcohol abuse  5. Chronic abdominal pain and recent vaginal bleeding: further workup per  primary team   Signed, Almyra Deforest, PA-C 02/14/2014, 1:48 PM   I have seen and examined the patient along with Almyra Deforest, PA.  I have reviewed the chart, notes and new data.  I agree with PA's note.  Key new complaints: unresponsive, intubated Key examination changes: no overt signs of  decompensated CHF/hypervolemia, NSR Key new findings / data: review of multiple ECGs performed over the last several weeks shows consistently prolonged QTC (usually around 500 ms), but interpretation is cautioned by evidence of recurrent hypokalemia (related to alcohol use, nausea, vomiting?). One ECG shows ventricular bigeminy with R on T phenomenon.  Echo shows severely depressed LV function with a non dilated left ventricle and left atrium (suggesting a more acute pathophysiology), with regional wall motion abnormalities. In addition to anteroseptal hypokinesis, on my review of echo I think there is inferobasal dyskinesis.  VF arrest (likely torsades de pointes) in a patient with prolonged QT, severely depressed LVEF and biochemical abnormalities.  Differential diagnosis is broad and includes: - premature CAD - initial presentation of dilated cardiomyopathy - cardiac sarcoidosis (inferobasal aneurysm, nidus for PVCs and triggered VT/VF?) - post resuscitation cardiomyopathy/Takotsubo sd. - should be expected to show marked improvement in LV function within next week  PLAN: Further workup will depend heavily on degree of neurological recovery - if she does recover, will need coronary angio and right and left heart cath, repeat assessment of LV function by echo, maybe also cardiac MRI.  Avoid all QT prolonging agents - including antidepressants and meticulously maintain K and Mg and Ca homeostasis. Repeat ECG to reevaluate QTc when all these offending agents are removed.  Sanda Klein, MD, [place] [phone] 02/14/2014, 3:25 PM

## 2014-02-14 NOTE — Progress Notes (Signed)
Subjective: Patient remains unresponsive. Hypothermia protocol initiated.  Continuous EEG shows slowing but no epileptiform activity.      Objective: Current vital signs: BP 131/98  Pulse 73  Temp(Src) 91.9 F (33.3 C) (Core (Comment))  Resp 20  Ht 5' 6.14" (1.68 m)  Wt 50 kg (110 lb 3.7 oz)  BMI 17.72 kg/m2  SpO2 100% Vital signs in last 24 hours: Temp:  [91 F (32.8 C)-98.2 F (36.8 C)] 91.9 F (33.3 C) (07/29 1200) Pulse Rate:  [66-152] 73 (07/29 1200) Resp:  [12-30] 20 (07/29 1200) BP: (102-159)/(67-117) 131/98 mmHg (07/29 1200) SpO2:  [81 %-100 %] 100 % (07/29 1200) Arterial Line BP: (93-130)/(61-95) 126/86 mmHg (07/29 1200) FiO2 (%):  [30 %-100 %] 30 % (07/29 1200) Weight:  [49.9 kg (110 lb 0.2 oz)-50 kg (110 lb 3.7 oz)] 50 kg (110 lb 3.7 oz) (07/29 0415)  Intake/Output from previous day: 07/28 0701 - 07/29 0700 In: 2486.1 [I.V.:1936.1; IV Piggyback:550] Out: 3100 [Urine:3100] Intake/Output this shift: Total I/O In: 1239.4 [I.V.:479.4; IV Piggyback:760] Out: 245 [Urine:245] Nutritional status: NPO  Neurologic Exam: Mental Status: Patient does not respond to verbal stimuli.  Does not respond to deep sternal rub.  Does not follow commands.  No verbalizations noted.  Cranial Nerves: II: patient does not respond confrontation bilaterally, pupils right 2 mm, left 2 mm,and unreactivie bilaterally III,IV,VI: doll's response absent bilaterally.  V,VII: corneal reflex absent bilaterally  VIII: patient does not respond to verbal stimuli IX,X: gag reflex absent, XI: trapezius strength unable to test bilaterally XII: tongue strength unable to test Motor: Extremities flaccid throughout.  No spontaneous movement noted.  No purposeful movements noted. Sensory: Does not respond to noxious stimuli in any extremity. Deep Tendon Reflexes:  Absent throughout. Plantars: absent bilaterally Cerebellar: Unable to perform    Lab Results: Basic Metabolic Panel:  Recent  Labs Lab 02/08/14 1635 02/13/14 2050  02/13/14 2253 02/14/14 0043 02/14/14 0243 02/14/14 0343 02/14/14 0435 02/14/14 0525 02/14/14 0900  NA 145 141  < > 141  --  141 142 142  --  142  K 3.0* 2.8*  < > 3.1*  --  2.4* 3.5* 3.3*  --  3.5*  CL 102 92*  < > 96  --  103 104 105  --  104  CO2 25 19  --  18*  --   --   --   --   --  20  GLUCOSE 120* 267*  < > 272*  --  160* 131* 119*  --  127*  BUN 7 5*  < > 5*  --  <3* <3* <3*  --  3*  CREATININE 0.37* 0.52  < > 0.47*  0.46*  --  0.30* 0.30* 0.30*  --  0.32*  CALCIUM 8.2* 7.6*  --  7.2*  --   --   --   --   --  5.9*  MG  --  1.0*  --  2.4 2.7*  --   --   --  2.4 1.9  PHOS  --   --   --  2.7  --   --   --   --   --   --   < > = values in this interval not displayed.  Liver Function Tests:  Recent Labs Lab 02/13/14 2050  AST 384*  ALT 50*  ALKPHOS 171*  BILITOT 3.4*  PROT 7.1  ALBUMIN 4.2    Recent Labs Lab 02/13/14 2050  LIPASE 42  Recent Labs Lab 02/13/14 2118  AMMONIA 73*    CBC:  Recent Labs Lab 02/08/14 1635 02/13/14 2050  02/13/14 2253 02/14/14 0243 02/14/14 0343 02/14/14 0435 02/14/14 0455 02/14/14 0927  WBC 1.9* 3.5*  --  7.6  --   --   --  6.0  --   NEUTROABS 0.8*  --   --   --   --   --   --   --   --   HGB 10.6* 11.7*  < > 9.9* 12.6 11.9* 11.2* 9.8*  --   HCT 32.0* 35.4*  < > 31.1* 37.0 35.0* 33.0* 30.2*  --   MCV 88.9 90.3  --  92.6  --   --   --  91.5  --   PLT 96* 76*  --  74*  --   --   --  66* 91*  < > = values in this interval not displayed.  Cardiac Enzymes: No results found for this basename: CKTOTAL, CKMB, CKMBINDEX, TROPONINI,  in the last 168 hours  Lipid Panel: No results found for this basename: CHOL, TRIG, HDL, CHOLHDL, VLDL, LDLCALC,  in the last 168 hours  CBG:  Recent Labs Lab 02/14/14 0702 02/14/14 0801 02/14/14 0907 02/14/14 1013 02/14/14 1115  GLUCAP 219* 189* 134* 112* 106*    Microbiology: Results for orders placed during the hospital encounter of  02/13/14  MRSA PCR SCREENING     Status: None   Collection Time    02/14/14 12:27 AM      Result Value Ref Range Status   MRSA by PCR NEGATIVE  NEGATIVE Final   Comment:            The GeneXpert MRSA Assay (FDA     approved for NASAL specimens     only), is one component of a     comprehensive MRSA colonization     surveillance program. It is not     intended to diagnose MRSA     infection nor to guide or     monitor treatment for     MRSA infections.    Coagulation Studies:  Recent Labs  02/13/14 2253 02/14/14 0140 02/14/14 0920 02/14/14 0927  LABPROT 14.0 14.6 14.8 15.3*  INR 1.08 1.14 1.16 1.21    Imaging: Ct Angio Head W/cm &/or Wo Cm  02/13/2014   CLINICAL DATA:  Unresponsive.  EXAM: CT ANGIOGRAPHY HEAD  TECHNIQUE: Multidetector CT imaging of the head was performed using the standard protocol during bolus administration of intravenous contrast. Multiplanar CT image reconstructions and MIPs were obtained to evaluate the vascular anatomy.  CONTRAST:  35mL OMNIPAQUE IOHEXOL 350 MG/ML SOLN  COMPARISON:  None.  FINDINGS: CT head: The ventricles and sulci are normal. No intraparenchymal hemorrhage, mass effect nor midline shift. No acute large vascular territory infarcts. Postcontrast imaging demonstrates no abnormal parenchymal nor leptomeningeal enhancement.  No abnormal extra-axial fluid collections. Basal cisterns are patent.  No skull fracture. The included ocular globes and orbital contents are non-suspicious. The mastoid aircells and included paranasal sinuses are well-aerated. Life support lines in place, seen on the localizer.  CTA head: Anterior circulation: Normal appearance of the cervical internal carotid arteries, petrous, cavernous and supra clinoid internal carotid arteries. Widely patent anterior communicating artery. Normal appearance of the anterior and middle cerebral arteries.  Posterior circulation: Left vertebral artery is dominant, with normal appearance of the  vertebral arteries, vertebrobasilar junction and basilar artery, as well as main branch vessels. Small bilateral posterior communicating  arteries are present. Normal appearance of the posterior cerebral arteries.  No large vessel occlusion, hemodynamically significant stenosis, dissection, luminal irregularity, contrast extravasation or aneurysm within the anterior nor posterior circulation.  Review of the MIP images confirms the above findings.  IMPRESSION: CT head: No acute intracranial process, normal examination. No abnormal enhancement.  CTA head:  No acute vascular process, normal examination.   Electronically Signed   By: Elon Alas   On: 02/13/2014 22:28   Dg Chest Port 1 View  02/14/2014   CLINICAL DATA:  Central line placement.  EXAM: PORTABLE CHEST - 1 VIEW  COMPARISON:  02/13/2014  FINDINGS: Interval placement of a right central venous catheter with tip over the cavoatrial junction. No pneumothorax. Endotracheal tube appears to the advanced slightly with tip now the origin of the right mainstem bronchus. New placement of an enteric tube. The tip is off the field of view but below the left hemidiaphragm. Normal heart size and pulmonary vascularity. Lungs are clear and expanded.  IMPRESSION: Right central venous catheter tip localizes over the cavoatrial junction. No pneumothorax. Endotracheal tube tip now appears to reside in the origin of the right mainstem bronchus.  These results were called by telephone at the time of interpretation on 02/14/2014 at 1:12 am to the patient's nurse, Thomas, who verbally acknowledged these results.   Electronically Signed   By: Lucienne Capers M.D.   On: 02/14/2014 01:14   Dg Chest Port 1 View  02/13/2014   CLINICAL DATA:  Post CPR  EXAM: PORTABLE CHEST - 1 VIEW  COMPARISON:  Prior radiograph from 12/01/2013  FINDINGS: Defibrillator pads overlie the thorax. Patient is intubated with the tip of the endotracheal tube approximately 1 cm above the carina.  Mediastinal silhouette within normal limits.  The lungs are normally inflated. No airspace consolidation, pleural effusion, or pulmonary edema is identified. There is no pneumothorax.  No acute osseous abnormality identified.  IMPRESSION: 1. Tip of endotracheal tube somewhat low lying just above the carina. Retraction by 2 cm could be considered to avoid slippage into the mainstem bronchi. 2. No acute cardiopulmonary abnormality identified. Results were called by telephone at the time of interpretation on 02/13/2014 at 9:13 pm to Dr. Claudean Severance , who verbally acknowledged these results.   Electronically Signed   By: Jeannine Boga M.D.   On: 02/13/2014 21:13    Medications:  I have reviewed the patient's current medications. Scheduled: . antiseptic oral rinse  7 mL Mouth Rinse QID  . artificial tears  1 application Both Eyes 3 times per day  . [START ON 02/15/2014] chlorhexidine  15 mL Mouth Rinse BID  . cisatracurium  0.1 mg/kg Intravenous Once  . folic acid  1 mg Intravenous Daily  . levETIRAcetam  500 mg Intravenous BID  . pantoprazole (PROTONIX) IV  40 mg Intravenous QHS  . thiamine  100 mg Intravenous Daily    Assessment/Plan: 38 year old female presenting with VF arrest and seizure activity.  Currently undergoing hypothermia protocol.  On Keppra.  Due to be warmed overnight.    Recommendations: 1.  Will continue continuous monitoring and continue to follow with you   LOS: 1 day   Alexis Goodell, MD [facility] [phone] 02/14/2014  12:15 PM

## 2014-02-14 NOTE — ED Provider Notes (Signed)
CSN: [id number]     Arrival date & time 02/13/14  2036 History   First MD Initiated Contact with Patient 02/13/14 2043     Chief Complaint  Patient presents with  . Cardiac Arrest     (Consider location/radiation/quality/duration/timing/severity/associated sxs/prior Treatment) Patient is a 38 y.o. female presenting with seizures.  Seizures Seizure activity on arrival: yes   Seizure type:  Myoclonic Preceding symptoms: nausea   Initial focality:  Unable to specify Postictal symptoms: somnolence   Return to baseline: no   Severity:  Severe   Past Medical History  Diagnosis Date  . Proctitis   . Cysts of both ovaries   . Seizures   . Anemia   . Anxiety   . Blood transfusion without reported diagnosis   . Depression   . Fatty liver 10/05/13   Past Surgical History  Procedure Laterality Date  . Ovarian cyst removal    . Laparoscopy N/A 09/28/2013    Procedure: LAPAROSCOPY OPERATIVE;  Surgeon: Terrance Mass, MD;  Location: [facility];  Service: Gynecology;  Laterality: N/A;  . Laparoscopic appendectomy Right 09/28/2013    Procedure: APPENDECTOMY LAPAROSCOPIC;  Surgeon: Terrance Mass, MD;  Location: [facility];  Service: Gynecology;  Laterality: Right;  . Salpingoophorectomy Right 09/28/2013    Procedure: SALPINGO OOPHORECTOMY;  Surgeon: Terrance Mass, MD;  Location: [facility];  Service: Gynecology;  Laterality: Right;  . Colonoscopy N/A 09/30/2013    Procedure: COLONOSCOPY;  Surgeon: Lafayette Dragon, MD;  Location: [facility];  Service: Endoscopy;  Laterality: N/A;  . Esophagogastroduodenoscopy N/A 11/23/2013    Procedure: ESOPHAGOGASTRODUODENOSCOPY (EGD);  Surgeon: Jerene Bears, MD;  Location: [facility];  Service: Endoscopy;  Laterality: N/A;  . Appendectomy     Family History  Problem Relation Age of Onset  . Diabetes Mother   . Hyperlipidemia Mother   . Stroke Mother   . Diabetes Father    History  Substance Use Topics  . Smoking status: Never Smoker   . Smokeless  tobacco: Never Used  . Alcohol Use: Yes   OB History   Grav Para Term Preterm Abortions TAB SAB Ect Mult Living   7 3   4  4   3      Review of Systems  Unable to perform ROS: Patient unresponsive  Neurological: Positive for seizures.      Allergies  Morphine and related; Tramadol; and Penicillins  Home Medications   Prior to Admission medications   Medication Sig Start Date End Date Taking? Authorizing Provider  acamprosate (CAMPRAL) 333 MG tablet Take 2 tablets (666 mg total) by mouth 3 (three) times daily with meals. 01/09/14  Yes Benjamine Mola, FNP  traZODone (DESYREL) 25 mg TABS tablet Take 150 mg by mouth at bedtime as needed for sleep. 01/09/14  Yes Benjamine Mola, FNP  acetaminophen (TYLENOL) 325 MG tablet Take 650 mg by mouth every 6 (six) hours as needed for moderate pain.    Historical Provider, MD  FLUoxetine (PROZAC) 20 MG capsule Take 40 mg by mouth daily. 01/09/14   Benjamine Mola, FNP  tetrahydrozoline 0.05 % ophthalmic solution Place 1 drop into both eyes 2 (two) times daily as needed (dry eyes).    Historical Provider, MD   BP 159/107  Pulse 105  Temp(Src) 96.8 F (36 C) (Core (Comment))  Resp 18  Ht 5' 6.14" (1.68 m)  Wt 110 lb 0.2 oz (49.9 kg)  BMI 17.68 kg/m2  SpO2 100% Physical Exam  Nursing note  and vitals reviewed. Constitutional: She appears well-developed.  HENT:  Head: Normocephalic.  Eyes: Pupils are equal, round, and reactive to light.  Round reactive 60mm  Neck: Neck supple.  Cardiovascular: Normal rate.  Exam reveals no gallop and no friction rub.   No murmur heard. Pulmonary/Chest: Effort normal and breath sounds normal. No respiratory distress.  Abdominal: Soft. She exhibits no distension. There is no tenderness. There is no rebound.  Musculoskeletal: She exhibits no edema.  Neurological: GCS eye subscore is 1. GCS verbal subscore is 1. GCS motor subscore is 4.  displaying seizure like activity and clonus on exam   Skin: Skin is warm.   Psychiatric: She has a normal mood and affect.    ED Course  Procedures (including critical care time) Labs Review Labs Reviewed  CBC - Abnormal; Notable for the following:    WBC 3.5 (*)    Hemoglobin 11.7 (*)    HCT 35.4 (*)    RDW 18.0 (*)    Platelets 76 (*)    All other components within normal limits  COMPREHENSIVE METABOLIC PANEL - Abnormal; Notable for the following:    Potassium 2.8 (*)    Chloride 92 (*)    Glucose, Bld 267 (*)    BUN 5 (*)    Calcium 7.6 (*)    AST 384 (*)    ALT 50 (*)    Alkaline Phosphatase 171 (*)    Total Bilirubin 3.4 (*)    Anion gap 30 (*)    All other components within normal limits  SALICYLATE LEVEL - Abnormal; Notable for the following:    Salicylate Lvl <9.5 (*)    All other components within normal limits  URINALYSIS, ROUTINE W REFLEX MICROSCOPIC - Abnormal; Notable for the following:    Color, Urine AMBER (*)    APPearance CLOUDY (*)    Glucose, UA >1000 (*)    Hgb urine dipstick LARGE (*)    Bilirubin Urine SMALL (*)    Ketones, ur 40 (*)    Protein, ur 100 (*)    All other components within normal limits  AMMONIA - Abnormal; Notable for the following:    Ammonia 73 (*)    All other components within normal limits  MAGNESIUM - Abnormal; Notable for the following:    Magnesium 1.0 (*)    All other components within normal limits  URINE MICROSCOPIC-ADD ON - Abnormal; Notable for the following:    Bacteria, UA FEW (*)    Casts GRANULAR CAST (*)    All other components within normal limits  BASIC METABOLIC PANEL - Abnormal; Notable for the following:    Potassium 3.1 (*)    CO2 18 (*)    Glucose, Bld 272 (*)    BUN 5 (*)    Creatinine, Ser 0.47 (*)    Calcium 7.2 (*)    Anion gap 27 (*)    All other components within normal limits  CBC - Abnormal; Notable for the following:    RBC 3.36 (*)    Hemoglobin 9.9 (*)    HCT 31.1 (*)    RDW 18.3 (*)    Platelets 74 (*)    All other components within normal limits   CREATININE, SERUM - Abnormal; Notable for the following:    Creatinine, Ser 0.46 (*)    All other components within normal limits  I-STAT CG4 LACTIC ACID, ED - Abnormal; Notable for the following:    Lactic Acid, Venous 9.40 (*)  All other components within normal limits  I-STAT CHEM 8, ED - Abnormal; Notable for the following:    Sodium 136 (*)    Potassium 2.5 (*)    BUN <3 (*)    Glucose, Bld 285 (*)    Calcium, Ion 0.83 (*)    All other components within normal limits  I-STAT ARTERIAL BLOOD GAS, ED - Abnormal; Notable for the following:    pH, Arterial 7.520 (*)    pCO2 arterial 31.8 (*)    pO2, Arterial 490.0 (*)    Bicarbonate 26.1 (*)    Acid-Base Excess 3.0 (*)    All other components within normal limits  CBG MONITORING, ED - Abnormal; Notable for the following:    Glucose-Capillary 256 (*)    All other components within normal limits  I-STAT ARTERIAL BLOOD GAS, ED - Abnormal; Notable for the following:    pCO2 arterial 33.5 (*)    Bicarbonate 19.9 (*)    Acid-base deficit 5.0 (*)    All other components within normal limits  MRSA PCR SCREENING  LIPASE, BLOOD  ACETAMINOPHEN LEVEL  ETHANOL  URINE RAPID DRUG SCREEN (HOSP PERFORMED)  PREGNANCY, URINE  PROTIME-INR  APTT  PHOSPHORUS  MAGNESIUM  PROTIME-INR  APTT  LACTIC ACID, PLASMA  LACTIC ACID, PLASMA  LACTIC ACID, PLASMA  CBC  BLOOD GAS, ARTERIAL  BILIRUBIN, DIRECT  MAGNESIUM  MAGNESIUM  MAGNESIUM  MAGNESIUM  APTT  PROTIME-INR  I-STAT TROPOININ, ED    Imaging Review Ct Angio Head W/cm &/or Wo Cm  02/13/2014   CLINICAL DATA:  Unresponsive.  EXAM: CT ANGIOGRAPHY HEAD  TECHNIQUE: Multidetector CT imaging of the head was performed using the standard protocol during bolus administration of intravenous contrast. Multiplanar CT image reconstructions and MIPs were obtained to evaluate the vascular anatomy.  CONTRAST:  60mL OMNIPAQUE IOHEXOL 350 MG/ML SOLN  COMPARISON:  None.  FINDINGS: CT head: The  ventricles and sulci are normal. No intraparenchymal hemorrhage, mass effect nor midline shift. No acute large vascular territory infarcts. Postcontrast imaging demonstrates no abnormal parenchymal nor leptomeningeal enhancement.  No abnormal extra-axial fluid collections. Basal cisterns are patent.  No skull fracture. The included ocular globes and orbital contents are non-suspicious. The mastoid aircells and included paranasal sinuses are well-aerated. Life support lines in place, seen on the localizer.  CTA head: Anterior circulation: Normal appearance of the cervical internal carotid arteries, petrous, cavernous and supra clinoid internal carotid arteries. Widely patent anterior communicating artery. Normal appearance of the anterior and middle cerebral arteries.  Posterior circulation: Left vertebral artery is dominant, with normal appearance of the vertebral arteries, vertebrobasilar junction and basilar artery, as well as main branch vessels. Small bilateral posterior communicating arteries are present. Normal appearance of the posterior cerebral arteries.  No large vessel occlusion, hemodynamically significant stenosis, dissection, luminal irregularity, contrast extravasation or aneurysm within the anterior nor posterior circulation.  Review of the MIP images confirms the above findings.  IMPRESSION: CT head: No acute intracranial process, normal examination. No abnormal enhancement.  CTA head:  No acute vascular process, normal examination.   Electronically Signed   By: Elon Alas   On: 02/13/2014 22:28   Dg Chest Port 1 View  02/14/2014   CLINICAL DATA:  Central line placement.  EXAM: PORTABLE CHEST - 1 VIEW  COMPARISON:  02/13/2014  FINDINGS: Interval placement of a right central venous catheter with tip over the cavoatrial junction. No pneumothorax. Endotracheal tube appears to the advanced slightly with tip now the origin of the  right mainstem bronchus. New placement of an enteric tube. The  tip is off the field of view but below the left hemidiaphragm. Normal heart size and pulmonary vascularity. Lungs are clear and expanded.  IMPRESSION: Right central venous catheter tip localizes over the cavoatrial junction. No pneumothorax. Endotracheal tube tip now appears to reside in the origin of the right mainstem bronchus.  These results were called by telephone at the time of interpretation on 02/14/2014 at 1:12 am to the patient's nurse, [place], who verbally acknowledged these results.   Electronically Signed   By: Lucienne Capers M.D.   On: 02/14/2014 01:14   Dg Chest Port 1 View  02/13/2014   CLINICAL DATA:  Post CPR  EXAM: PORTABLE CHEST - 1 VIEW  COMPARISON:  Prior radiograph from 12/01/2013  FINDINGS: Defibrillator pads overlie the thorax. Patient is intubated with the tip of the endotracheal tube approximately 1 cm above the carina. Mediastinal silhouette within normal limits.  The lungs are normally inflated. No airspace consolidation, pleural effusion, or pulmonary edema is identified. There is no pneumothorax.  No acute osseous abnormality identified.  IMPRESSION: 1. Tip of endotracheal tube somewhat low lying just above the carina. Retraction by 2 cm could be considered to avoid slippage into the mainstem bronchi. 2. No acute cardiopulmonary abnormality identified. Results were called by telephone at the time of interpretation on 02/13/2014 at 9:13 pm to Dr. Claudean Severance , who verbally acknowledged these results.   Electronically Signed   By: Jeannine Boga M.D.   On: 02/13/2014 21:13     EKG Interpretation None      MDM   Final diagnoses:  Cardiac arrest  Severe alcohol use disorder  Alcohol withdrawal seizure with complication, uncomplicated  Acute respiratory failure with hypoxia  Hypomagnesemia   38 y/o female with known history of alcohol abuse that is noted by boyfriend to have gone 24 hours without a drink. Patient noted to have started seizing and then lost a pulse,  bystander CPR initiated and then EMS arrived they continued CPR and administered 1 shock for V fib and 1 round of epi. The patient then had ROSC and was transported in. The patient continued to have seizure like activity and valium push was initiated with a valium drip. Neurology was consulted and came immediatly to bed side to continue the care of the patient seizures. EEG was initiated. Critical care was also consulted and they continue emergent care in the ED.     Claudean Severance, MD 02/15/14 [phone]

## 2014-02-15 ENCOUNTER — Inpatient Hospital Stay (HOSPITAL_COMMUNITY): Payer: Self-pay

## 2014-02-15 ENCOUNTER — Ambulatory Visit: Admitting: Gynecology

## 2014-02-15 DIAGNOSIS — I509 Heart failure, unspecified: Secondary | ICD-10-CM

## 2014-02-15 DIAGNOSIS — I5021 Acute systolic (congestive) heart failure: Secondary | ICD-10-CM

## 2014-02-15 LAB — GLUCOSE, CAPILLARY
GLUCOSE-CAPILLARY: 51 mg/dL — AB (ref 70–99)
GLUCOSE-CAPILLARY: 65 mg/dL — AB (ref 70–99)
Glucose-Capillary: 104 mg/dL — ABNORMAL HIGH (ref 70–99)
Glucose-Capillary: 110 mg/dL — ABNORMAL HIGH (ref 70–99)
Glucose-Capillary: 66 mg/dL — ABNORMAL LOW (ref 70–99)
Glucose-Capillary: 69 mg/dL — ABNORMAL LOW (ref 70–99)
Glucose-Capillary: 69 mg/dL — ABNORMAL LOW (ref 70–99)
Glucose-Capillary: 78 mg/dL (ref 70–99)
Glucose-Capillary: 78 mg/dL (ref 70–99)
Glucose-Capillary: 79 mg/dL (ref 70–99)
Glucose-Capillary: 87 mg/dL (ref 70–99)

## 2014-02-15 LAB — BASIC METABOLIC PANEL
ANION GAP: 14 (ref 5–15)
Anion gap: 15 (ref 5–15)
Anion gap: 15 (ref 5–15)
Anion gap: 13 (ref 5–15)
BUN: <3 mg/dL — ABNORMAL LOW (ref 6–23)
BUN: 3 mg/dL — AB (ref 6–23)
BUN: 3 mg/dL — ABNORMAL LOW (ref 6–23)
BUN: 3 mg/dL — ABNORMAL LOW (ref 6–23)
CALCIUM: 6.7 mg/dL — AB (ref 8.4–10.5)
CALCIUM: 6.8 mg/dL — AB (ref 8.4–10.5)
CO2: 18 meq/L — AB (ref 19–32)
CO2: 18 mEq/L — ABNORMAL LOW (ref 19–32)
CO2: 20 mEq/L (ref 19–32)
CO2: 21 mEq/L (ref 19–32)
CREATININE: 0.24 mg/dL — AB (ref 0.50–1.10)
CREATININE: 0.42 mg/dL — AB (ref 0.50–1.10)
Calcium: 6.4 mg/dL — CL (ref 8.4–10.5)
Calcium: 7.2 mg/dL — ABNORMAL LOW (ref 8.4–10.5)
Chloride: 110 mEq/L (ref 96–112)
Chloride: 110 mEq/L (ref 96–112)
Chloride: 108 mEq/L (ref 96–112)
Chloride: 106 mEq/L (ref 96–112)
Creatinine, Ser: 0.28 mg/dL — ABNORMAL LOW (ref 0.50–1.10)
Creatinine, Ser: 0.31 mg/dL — ABNORMAL LOW (ref 0.50–1.10)
GFR calc Af Amer: >90 mL/min (ref >90)
GFR calc Af Amer: >90 mL/min (ref >90)
GFR calc Af Amer: >90 mL/min (ref >90)
GFR calc non Af Amer: >90 mL/min (ref >90)
GFR calc non Af Amer: >90 mL/min (ref >90)
GLUCOSE: 86 mg/dL (ref 70–99)
GLUCOSE: 77 mg/dL (ref 70–99)
Glucose, Bld: 235 mg/dL — ABNORMAL HIGH (ref 70–99)
Glucose, Bld: 104 mg/dL — ABNORMAL HIGH (ref 70–99)
POTASSIUM: 3.2 meq/L — AB (ref 3.7–5.3)
Potassium: 3.6 mEq/L — ABNORMAL LOW (ref 3.7–5.3)
Potassium: 3.3 mEq/L — ABNORMAL LOW (ref 3.7–5.3)
Potassium: 4.5 mEq/L (ref 3.7–5.3)
SODIUM: 142 meq/L (ref 137–147)
SODIUM: 140 meq/L (ref 137–147)
Sodium: 143 mEq/L (ref 137–147)
Sodium: 143 mEq/L (ref 137–147)

## 2014-02-15 LAB — CBC WITH DIFFERENTIAL/PLATELET
BASOS ABS: 0 10*3/uL (ref 0.0–0.1)
Basophils Relative: 0 % (ref 0–1)
EOS ABS: 0 10*3/uL (ref 0.0–0.7)
Eosinophils Relative: 0 % (ref 0–5)
HCT: 29.6 % — ABNORMAL LOW (ref 36.0–46.0)
Hemoglobin: 9.7 g/dL — ABNORMAL LOW (ref 12.0–15.0)
LYMPHS ABS: 0.5 10*3/uL — AB (ref 0.7–4.0)
Lymphocytes Relative: 14 % (ref 12–46)
MCH: 29.5 pg (ref 26.0–34.0)
MCHC: 32.8 g/dL (ref 30.0–36.0)
MCV: 90 fL (ref 78.0–100.0)
Monocytes Absolute: 0.2 10*3/uL (ref 0.1–1.0)
Monocytes Relative: 4 % (ref 3–12)
NEUTROS PCT: 81 % — AB (ref 43–77)
Neutro Abs: 3.1 10*3/uL (ref 1.7–7.7)
PLATELETS: 51 10*3/uL — AB (ref 150–400)
RBC: 3.29 MIL/uL — AB (ref 3.87–5.11)
RDW: 17.9 % — AB (ref 11.5–15.5)
WBC: 3.8 10*3/uL — ABNORMAL LOW (ref 4.0–10.5)

## 2014-02-15 LAB — HEPATIC FUNCTION PANEL
ALBUMIN: 2.4 g/dL — AB (ref 3.5–5.2)
ALT: 17 U/L (ref 0–35)
AST: 64 U/L — ABNORMAL HIGH (ref 0–37)
Alkaline Phosphatase: 96 U/L (ref 39–117)
BILIRUBIN TOTAL: 0.9 mg/dL (ref 0.3–1.2)
Bilirubin, Direct: 0.3 mg/dL (ref 0.0–0.3)
Indirect Bilirubin: 0.6 mg/dL (ref 0.3–0.9)
TOTAL PROTEIN: 4.8 g/dL — AB (ref 6.0–8.3)

## 2014-02-15 LAB — BLOOD GAS, ARTERIAL
Acid-base deficit: 4.5 mmol/L — ABNORMAL HIGH (ref 0.0–2.0)
Bicarbonate: 19 mEq/L — ABNORMAL LOW (ref 20.0–24.0)
DRAWN BY: 25231
FIO2: 0.3 %
LHR: 20 {breaths}/min
MECHVT: 480 mL
O2 SAT: 100 %
PCO2 ART: 24.1 mmHg — AB (ref 35.0–45.0)
PEEP/CPAP: 5 cmH2O
Patient temperature: 92.5
TCO2: 19.8 mmol/L (ref 0–100)
pH, Arterial: 7.49 — ABNORMAL HIGH (ref 7.350–7.450)
pO2, Arterial: 137 mmHg — ABNORMAL HIGH (ref 80.0–100.0)

## 2014-02-15 LAB — MAGNESIUM
Magnesium: 1.6 mg/dL (ref 1.5–2.5)
Magnesium: 1.9 mg/dL (ref 1.5–2.5)

## 2014-02-15 MED ORDER — MAGNESIUM SULFATE 40 MG/ML IJ SOLN
INTRAMUSCULAR | Status: AC
  Filled 2014-02-15: qty 50

## 2014-02-15 MED ORDER — LEVETIRACETAM IN NACL 500 MG/100ML IV SOLN
500.0000 mg | Freq: Once | INTRAVENOUS | Status: AC
  Administered 2014-02-15: 500 mg via INTRAVENOUS
  Filled 2014-02-15: qty 100

## 2014-02-15 MED ORDER — DEXTROSE 50 % IV SOLN
25.0000 mL | Freq: Once | INTRAVENOUS | Status: AC | PRN
  Administered 2014-02-15: 25 mL via INTRAVENOUS

## 2014-02-15 MED ORDER — PROPOFOL 10 MG/ML IV EMUL
5.0000 ug/kg/min | INTRAVENOUS | Status: DC
  Administered 2014-02-15: 20 ug/kg/min via INTRAVENOUS
  Administered 2014-02-16 (×2): 60 ug/kg/min via INTRAVENOUS
  Filled 2014-02-15 (×3): qty 100

## 2014-02-15 MED ORDER — PROPOFOL 10 MG/ML IV EMUL
INTRAVENOUS | Status: AC
  Filled 2014-02-15: qty 100

## 2014-02-15 MED ORDER — MAGNESIUM SULFATE IN D5W 10-5 MG/ML-% IV SOLN
1.0000 g | Freq: Once | INTRAVENOUS | Status: DC
  Filled 2014-02-15: qty 100

## 2014-02-15 MED ORDER — DEXTROSE 50 % IV SOLN
INTRAVENOUS | Status: AC
  Filled 2014-02-15: qty 50

## 2014-02-15 MED ORDER — DEXTROSE 50 % IV SOLN
INTRAVENOUS | Status: AC
  Administered 2014-02-15: 25 mL via INTRAVENOUS
  Filled 2014-02-15: qty 50

## 2014-02-15 MED ORDER — VITAL HIGH PROTEIN PO LIQD
1000.0000 mL | ORAL | Status: DC
  Filled 2014-02-15 (×2): qty 1000

## 2014-02-15 MED ORDER — POTASSIUM CHLORIDE 10 MEQ/50ML IV SOLN
10.0000 meq | INTRAVENOUS | Status: AC
  Administered 2014-02-15 (×3): 10 meq via INTRAVENOUS
  Filled 2014-02-15 (×2): qty 50

## 2014-02-15 MED ORDER — ADENOSINE 6 MG/2ML IV SOLN
INTRAVENOUS | Status: AC
  Filled 2014-02-15: qty 4

## 2014-02-15 MED ORDER — POTASSIUM CHLORIDE 10 MEQ/50ML IV SOLN
INTRAVENOUS | Status: AC
  Filled 2014-02-15: qty 100

## 2014-02-15 MED ORDER — LEVETIRACETAM IN NACL 1000 MG/100ML IV SOLN
1000.0000 mg | Freq: Two times a day (BID) | INTRAVENOUS | Status: DC
  Administered 2014-02-15 – 2014-02-21 (×12): 1000 mg via INTRAVENOUS
  Filled 2014-02-15 (×14): qty 100

## 2014-02-15 MED ORDER — SODIUM CHLORIDE 0.9 % IV SOLN
1.0000 ug/kg/min | INTRAVENOUS | Status: DC
  Filled 2014-02-15: qty 20

## 2014-02-15 MED ORDER — VITAL HIGH PROTEIN PO LIQD
1000.0000 mL | ORAL | Status: DC
  Administered 2014-02-15: 1000 mL
  Filled 2014-02-15 (×3): qty 1000

## 2014-02-15 MED ORDER — POTASSIUM CHLORIDE 10 MEQ/50ML IV SOLN
10.0000 meq | INTRAVENOUS | Status: AC
  Administered 2014-02-15: 10 meq via INTRAVENOUS

## 2014-02-15 MED ORDER — FENTANYL BOLUS VIA INFUSION
50.0000 ug | INTRAVENOUS | Status: DC | PRN
  Administered 2014-02-15 – 2014-02-16 (×5): 50 ug via INTRAVENOUS
  Filled 2014-02-15: qty 100

## 2014-02-15 MED ORDER — MAGNESIUM SULFATE 40 MG/ML IJ SOLN
2.0000 g | Freq: Once | INTRAMUSCULAR | Status: AC
  Administered 2014-02-15: 2 g via INTRAVENOUS

## 2014-02-15 MED ORDER — HYDROMORPHONE HCL PF 1 MG/ML IJ SOLN
1.0000 mg | INTRAMUSCULAR | Status: DC | PRN
Start: 2014-02-15 — End: 2014-02-19
  Administered 2014-02-16 – 2014-02-19 (×12): 1 mg via INTRAVENOUS
  Filled 2014-02-15 (×14): qty 1

## 2014-02-15 MED ORDER — SODIUM CHLORIDE 0.9 % IV SOLN
1.0000 g | Freq: Once | INTRAVENOUS | Status: AC
  Administered 2014-02-15: 1 g via INTRAVENOUS
  Filled 2014-02-15: qty 10

## 2014-02-15 NOTE — Progress Notes (Signed)
Pt had multiple runs of 5-6 beat vtach, wide complex.  Dr. Lake Bells notified.  Shortly after, pt went into sustained pulseless vtac for about 8-9 seconds.  Pt converted self out without drugs or shock.  Pt currently tachycardic in the 160s-170s.  See code note and MD note for further details.  Will continue to monitor pt closely and update as needed.

## 2014-02-15 NOTE — Progress Notes (Signed)
PULMONARY / CRITICAL CARE MEDICINE   Name: Kristen Roberson MRN: [id number] DOB: [birth year]    ADMISSION DATE:  02/13/2014 CONSULTATION DATE:  02/15/2014  REFERRING MD :  EDP  CHIEF COMPLAINT:  Status epilepticus complicated by V.fib arrest  INITIAL PRESENTATION: 38 y.o. F with hx of alcohol abuse brought to ED on 7/27 after she began to have seizure like activity while watching TV at home.  Boyfriend states she began shaking and eyes rolled back into her head.  A few seconds later, she stopped breathing.  He began CPR and dispatched EMS.  In the field, she had a V.fib arrest with ROSC after epi x 1 and 1 shock.  In ED, found to be hypomagnesemic, hypokalemic, hypocalcemic.  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  PCCM consulted for admission and initiation of hypothermia protocol.  STUDIES:  7/27 CTA head >>> NAICP 7/27 Continuous EEG >>> 7/29 TTE > LVEF 20-25%, severe hypokinesis anteroseptal myocardium, mild MR 7/30 transvaginal ultrasound > small intrauterine cyst, s/p R salpingo-oophrectomy, L ovary intact  SIGNIFICANT EVENTS: 7/27 witnessed seizure by pts fiance while watching TV at home, led to v.fib arrest in the field. 7/27 intubated, hypothermia protocol initiated.   SUBJECTIVE:  Yesterday found LVEF to be markedly depressed on echo  VITAL SIGNS: Temp:  [91 F (32.8 C)-95 F (35 C)] 95 F (35 C) (07/30 0800) Pulse Rate:  [64-135] 93 (07/30 0800) Resp:  [14-22] 20 (07/30 0800) BP: (99-144)/(73-101) 99/75 mmHg (07/30 0800) SpO2:  [100 %] 100 % (07/30 0800) Arterial Line BP: (88-143)/(60-100) 109/73 mmHg (07/30 0800) FiO2 (%):  [30 %] 30 % (07/30 0800) Weight:  [55.9 kg (123 lb 3.8 oz)] 55.9 kg (123 lb 3.8 oz) (07/30 0439) HEMODYNAMICS: CVP:  [2 mmHg-9 mmHg] 2 mmHg VENTILATOR SETTINGS: Vent Mode:  [-] PRVC FiO2 (%):  [30 %] 30 % Set Rate:  [20 bmp] 20 bmp Vt Set:  [450 mL] 450 mL PEEP:  [5  cmH20] 5 cmH20 Plateau Pressure:  [12 cmH20-14 cmH20] 12 cmH20 INTAKE / OUTPUT: Intake/Output     07/29 0701 - 07/30 0700 07/30 0701 - 07/31 0700   I.V. (mL/kg) 2575.6 (46.1) 111 (2)   NG/GT 70    IV Piggyback 1020    Total Intake(mL/kg) 3665.6 (65.6) 111 (2)   Urine (mL/kg/hr) 2190 (1.6) 40 (0.4)   Total Output 2190 40   Net +1475.6 +71          PHYSICAL EXAMINATION:  Gen: sedated, paralyzed on vent HEENT: NCAT, ETT in place PULM: CTA B CV: RRR, S3 noted AB: limited by pads Ext: warm, no edema, no clubbing, no cyanosis Derm: no obvious bruising Neuro: sedated and paralyzed on vent   LABS:  CBC  Recent Labs Lab 02/13/14 2253  02/14/14 0435 02/14/14 0455 02/14/14 0927 02/15/14 0445  WBC 7.6  --   --  6.0  --  3.8*  HGB 9.9*  < > 11.2* 9.8*  --  9.7*  HCT 31.1*  < > 33.0* 30.2*  --  29.6*  PLT 74*  --   --  66* 91* 51*  < > = values in this interval not displayed. Coag's  Recent Labs Lab 02/14/14 0140 02/14/14 0920 02/14/14 0927  APTT 26 33 31  INR 1.14 1.16 1.21   BMET  Recent Labs Lab 02/14/14 2100 02/15/14 0100 02/15/14 0445  NA 141 143 143  K 3.0* 3.6* 3.3*  CL  109 110 110  CO2 19 18* 18*  BUN <3* <3* 3*  CREATININE 0.23* 0.24* 0.28*  GLUCOSE 86 86 77   Electrolytes  Recent Labs Lab 02/13/14 2253  02/14/14 1212 02/14/14 1600 02/14/14 2100 02/15/14 0100 02/15/14 0445  CALCIUM 7.2*  < > 6.7* 6.4* 7.1* 6.7* 6.8*  MG 2.4  < > 1.8 1.6 2.1  --   --   PHOS 2.7  --   --   --   --   --   --   < > = values in this interval not displayed. Sepsis Markers  Recent Labs Lab 02/14/14 0311 02/14/14 0920 02/14/14 2212  LATICACIDVEN 8.6* 4.7* 0.9   ABG  Recent Labs Lab 02/13/14 2321 02/14/14 0257 02/15/14 0317  PHART 7.381 7.296* 7.490*  PCO2ART 33.5* 33.9* 24.1*  PO2ART 86.0 130.0* 137.0*   Liver Enzymes  Recent Labs Lab 02/13/14 2050  AST 384*  ALT 50*  ALKPHOS 171*  BILITOT 3.4*  ALBUMIN 4.2   Cardiac Enzymes No  results found for this basename: TROPONINI, PROBNP,  in the last 168 hours Glucose  Recent Labs Lab 02/14/14 1541 02/14/14 1620 02/14/14 1933 02/14/14 2344 02/15/14 0459 02/15/14 0834  GLUCAP 138* 117* 78 71 78 110*    Imaging Ct Angio Head W/cm &/or Wo Cm  02/13/2014   CLINICAL DATA:  Unresponsive.  EXAM: CT ANGIOGRAPHY HEAD  TECHNIQUE: Multidetector CT imaging of the head was performed using the standard protocol during bolus administration of intravenous contrast. Multiplanar CT image reconstructions and MIPs were obtained to evaluate the vascular anatomy.  CONTRAST:  40mL OMNIPAQUE IOHEXOL 350 MG/ML SOLN  COMPARISON:  None.  FINDINGS: CT head: The ventricles and sulci are normal. No intraparenchymal hemorrhage, mass effect nor midline shift. No acute large vascular territory infarcts. Postcontrast imaging demonstrates no abnormal parenchymal nor leptomeningeal enhancement.  No abnormal extra-axial fluid collections. Basal cisterns are patent.  No skull fracture. The included ocular globes and orbital contents are non-suspicious. The mastoid aircells and included paranasal sinuses are well-aerated. Life support lines in place, seen on the localizer.  CTA head: Anterior circulation: Normal appearance of the cervical internal carotid arteries, petrous, cavernous and supra clinoid internal carotid arteries. Widely patent anterior communicating artery. Normal appearance of the anterior and middle cerebral arteries.  Posterior circulation: Left vertebral artery is dominant, with normal appearance of the vertebral arteries, vertebrobasilar junction and basilar artery, as well as main branch vessels. Small bilateral posterior communicating arteries are present. Normal appearance of the posterior cerebral arteries.  No large vessel occlusion, hemodynamically significant stenosis, dissection, luminal irregularity, contrast extravasation or aneurysm within the anterior nor posterior circulation.  Review of  the MIP images confirms the above findings.  IMPRESSION: CT head: No acute intracranial process, normal examination. No abnormal enhancement.  CTA head:  No acute vascular process, normal examination.   Electronically Signed   By: Elon Alas   On: 02/13/2014 22:28   US Transvaginal Non-ob  02/14/2014   CLINICAL DATA:  Pregnant, post ventricular fibrillation and arrest, reported recent miscarriage, past history of RIGHT salpingo-oophorectomy ; quantitative beta HCG < 1  EXAM: TRANSABDOMINAL AND TRANSVAGINAL ULTRASOUND OF PELVIS  TECHNIQUE: Both transabdominal and transvaginal ultrasound examinations of the pelvis were performed. Transabdominal technique was performed for global imaging of the pelvis including uterus, ovaries, adnexal regions, and pelvic cul-de-sac. It was necessary to proceed with endovaginal exam following the transabdominal exam to visualize the LEFT ovary and RIGHT adnexa.  COMPARISON:  12/22/2013  FINDINGS:  Uterus  Measurements: 9.2 x 4.1 x 5.2 cm. Normal myometrial echogenicity. Small cystic structure 3 x 3 x 4 mm identified at the deep margin of the endometrial complex and myometrium, likely representing a tiny cyst in this patient with a negative pregnancy test. No additional uterine masses.  Endometrium  Thickness: 9 mm thick, normal.  No endometrial fluid.  Right ovary  Surgically absent.  Left ovary  Measurements: 2.9 x 1.5 x 3.1 cm. Normal morphology without mass.  Other findings  Moderate simple free pelvic fluid.  No adnexal masses.  IMPRESSION: Post RIGHT salpingo-oophorectomy.  Unremarkable LEFT ovary and endometrial complex.  Tiny 3 x 3 x 4 mm diameter cyst within uterus, patient with a quantitative beta HCG < 1.   Electronically Signed   By: Lavonia Dana M.D.   On: 02/14/2014 16:41   US Pelvis Complete  02/14/2014   CLINICAL DATA:  Pregnant, post ventricular fibrillation and arrest, reported recent miscarriage, past history of RIGHT salpingo-oophorectomy ; quantitative  beta HCG < 1  EXAM: TRANSABDOMINAL AND TRANSVAGINAL ULTRASOUND OF PELVIS  TECHNIQUE: Both transabdominal and transvaginal ultrasound examinations of the pelvis were performed. Transabdominal technique was performed for global imaging of the pelvis including uterus, ovaries, adnexal regions, and pelvic cul-de-sac. It was necessary to proceed with endovaginal exam following the transabdominal exam to visualize the LEFT ovary and RIGHT adnexa.  COMPARISON:  12/22/2013  FINDINGS: Uterus  Measurements: 9.2 x 4.1 x 5.2 cm. Normal myometrial echogenicity. Small cystic structure 3 x 3 x 4 mm identified at the deep margin of the endometrial complex and myometrium, likely representing a tiny cyst in this patient with a negative pregnancy test. No additional uterine masses.  Endometrium  Thickness: 9 mm thick, normal.  No endometrial fluid.  Right ovary  Surgically absent.  Left ovary  Measurements: 2.9 x 1.5 x 3.1 cm. Normal morphology without mass.  Other findings  Moderate simple free pelvic fluid.  No adnexal masses.  IMPRESSION: Post RIGHT salpingo-oophorectomy.  Unremarkable LEFT ovary and endometrial complex.  Tiny 3 x 3 x 4 mm diameter cyst within uterus, patient with a quantitative beta HCG < 1.   Electronically Signed   By: Lavonia Dana M.D.   On: 02/14/2014 16:41   Dg Chest Port 1 View  02/15/2014   CLINICAL DATA:  Assess endotracheal tube positioning  EXAM: PORTABLE CHEST - 1 VIEW  COMPARISON:  Portable chest x-ray of February 14, 2014  FINDINGS: The lungs are mildly hyperinflated and clear. The heart and mediastinal structures are normal. There is no pleural effusion or pneumothorax. The endotracheal tube tip lies 3.2 cm above the crotch of the carina. The esophagogastric tube tip projects off the inferior margin of the image. The right internal jugular venous catheter tip lies in the midportion of the SVC. External pacemaker pads are present.  IMPRESSION: The support tubes and lines are in appropriate position. No  acute cardiopulmonary abnormality is demonstrated.   Electronically Signed   By: David  Martinique   On: 02/15/2014 07:16   Dg Chest Port 1 View  02/14/2014   CLINICAL DATA:  Central line placement.  EXAM: PORTABLE CHEST - 1 VIEW  COMPARISON:  02/13/2014  FINDINGS: Interval placement of a right central venous catheter with tip over the cavoatrial junction. No pneumothorax. Endotracheal tube appears to the advanced slightly with tip now the origin of the right mainstem bronchus. New placement of an enteric tube. The tip is off the field of view but below the left  hemidiaphragm. Normal heart size and pulmonary vascularity. Lungs are clear and expanded.  IMPRESSION: Right central venous catheter tip localizes over the cavoatrial junction. No pneumothorax. Endotracheal tube tip now appears to reside in the origin of the right mainstem bronchus.  These results were called by telephone at the time of interpretation on 02/14/2014 at 1:12 am to the patient's nurse, Crockett, who verbally acknowledged these results.   Electronically Signed   By: Lucienne Capers M.D.   On: 02/14/2014 01:14   Dg Chest Port 1 View  02/13/2014   CLINICAL DATA:  Post CPR  EXAM: PORTABLE CHEST - 1 VIEW  COMPARISON:  Prior radiograph from 12/01/2013  FINDINGS: Defibrillator pads overlie the thorax. Patient is intubated with the tip of the endotracheal tube approximately 1 cm above the carina. Mediastinal silhouette within normal limits.  The lungs are normally inflated. No airspace consolidation, pleural effusion, or pulmonary edema is identified. There is no pneumothorax.  No acute osseous abnormality identified.  IMPRESSION: 1. Tip of endotracheal tube somewhat low lying just above the carina. Retraction by 2 cm could be considered to avoid slippage into the mainstem bronchi. 2. No acute cardiopulmonary abnormality identified. Results were called by telephone at the time of interpretation on 02/13/2014 at 9:13 pm to Dr. Claudean Severance , who verbally  acknowledged these results.   Electronically Signed   By: Jeannine Boga M.D.   On: 02/13/2014 21:13     ASSESSMENT / PLAN:  PULMONARY OETT 7/28 > A: Acute respiratory failure due to VFib arrest P:   Full mechanical support, decrease RR VAP bundle. ABG and CXR daily  CARDIOVASCULAR R IJ CVL 7/28 > R Radial aline 7/28 > A:  V.Fib arrest -likely related to hypomag/K Shock > cardiogenic Cardiomyopathy > ischemic vs dilated vs shock from arrest/takotsubo P:  Reduce goal MAP > 65 when warm Trend troponin / lactate. Further cardiac work up per cardiology   RENAL A:   Hypokalemia Hypomagnesemia Hypocalcemia > presumably related to EtOH abuse Lactic acidosis > resolved P:   KVO IVF Keep K > 4, Mg > 2 once re-warmed BMET/Mg q8h Monitor calcium and replete as needed  GASTROINTESTINAL A:   GI prophylaxis Elevated bilirubin P:   Start TF today Repeat LFT with previous lab Pantoprazole for stress ulcer prophylaxis  HEMATOLOGIC A:   VTE Prophylaxis Anemia - chronic. Thrombocytopenia > chronic, presumably related to EtOH P:  SCD's  Transfuse for Hgb < 7. CBC q shift.  INFECTIOUS A:   No evidence of infection P:   Monitor clinically.  ENDOCRINE A:   Hyperglycemia   P:   ICU hyperglycemia protocol.  NEUROLOGIC A:   Acute encephalopathy ETOH abuse EtOH withdrawal seizure? > EEG in ICU without seizure activity P:   Start to wean sedation after rewarmed today Thiamine / Folate. Continue home Keppra Neuro following, appreciate their assistance  OB/GYN:  A: Chronic vaginal bleeding, recent miscarriage? HCG neg, transvag u/s neg P: monitor   TODAY'S SUMMARY: 38 y/o female s/p VFib arrest after likely EtOH withdrawal seizure in setting of hypokalemia, hypomagnesemia and hypocalcemia, now with depressed EF on echo. rewarm today, cardiac work up depending on neuro status  Family: boyfriend showed up yesterday, will attempt to contact again  today  I have personally obtained a history, examined the patient, evaluated laboratory and imaging results, formulated the assessment and plan and placed orders. CRITICAL CARE: The patient is critically ill with multiple organ systems failure and requires high complexity decision making for  assessment and support, frequent evaluation and titration of therapies, application of advanced monitoring technologies and extensive interpretation of multiple databases. Critical Care Time devoted to patient care services described in this note is 45 minutes.   Roselie Awkward, MD [place] [facility] Pager: [phone] Cell: [phone] If no response, call [phone]

## 2014-02-15 NOTE — Procedures (Signed)
Indication:  This is a 38 yo female with unresponsiveness after cardiac arrest.  She had some myoclonus witnessed and VEEG study is to rule out any clinical or subclinical seizure.  The patient was sedated on mechanical ventilator support.  This is day two of continuous VEEG study.  Medications include: Ativan, Fentanyl, Nimbex, Versed, Levophed, Magnesium sulfate, KCL, and Keppra.  Technique and Protocol: This is a standard 18-channel VEEG study utilizing international 10/20 system with both bipolar and monopolar montages.  Video source and pushbutton were available.  The study was performed at [facility].  Description of Findings: The VEEG study was started at 8:09 am on  07/29 and ended at 1:30 am on 07/30.  Total 16-hour EEG study was scanned in its entity.  Video source was reviewed.  The baseline EEG background is improved, reaching 4-5 Hz after 12 noon, compared to the study one day ago. The amplitude of EEG background reaches 20-30 mV. There is some spontaneous EEG background reactivity.  There is intermittent, low-voltage arrhythmic delta noted too during VEEG study.   No pushbutton event was captured.  No clinical or subclinical seizure.  No epileptiform discharges.                                                                                                                                                                                                                                                                    .      Impression: This is a severely abnormal VEEG monitoring study.  It shows the presence of a severe nonspecific cerebral dysfunction.  The VEEG findings are consistent with a severe metabolic/toxic/anoxic encephalopathy.  Clinical correlation is strongly advised.  There is a noted improvement in EEG background compared to the study done one day prior.  Clinical correlation is advised. There are no clinical or subclinical seizures captured.

## 2014-02-15 NOTE — ED Provider Notes (Signed)
Medical screening examination/treatment/procedure(s) were conducted as a shared visit with resident-physician practitioner(s) and myself.  I personally evaluated the patient during the encounter.  Pt is a 38 y.o. female with pmhx as above presenting with unresponsiveness after v-fib arrest.  Pt found to have seizure-like activity during course of ED stay after multiple doses of IV BZDs, as well as significant e-lyte disturbances.  Replacement begun in ED.  Neurology consulted w/ plan for emergent EEG, as well as critical care who plan for hypothermia protocol.  Pt also loaded w/ dilantin in ED. I spoke multiple times w/ her significant other about course & plan, as well as with neurology.    EKG Interpretation  Date/Time:  Tuesday February 13 2014 23:49:06 EDT Ventricular Rate:  115 PR Interval:  119 QRS Duration: 94 QT Interval:  393 QTC Calculation: 544 R Axis:   76 Text Interpretation:  Sinus tachycardia Ventricular bigeminy Borderline short PR interval Left atrial enlargement Minimal ST depression, inferior leads Borderline prolonged QT interval ED PHYSICIAN INTERPRETATION AVAILABLE IN [facility] Confirmed by TEST, Record ([id number]) on 02/15/2014 7:48:26 AM      CRITICAL CARE Performed by: Ernestina Patches, E Total critical care time: 45 Critical care time was exclusive of separately billable procedures and treating other patients. Critical care was necessary to treat or prevent imminent or life-threatening deterioration. Critical care was time spent personally by me on the following activities: development of treatment plan with patient and/or surrogate as well as nursing, discussions with consultants, evaluation of patient's response to treatment, examination of patient, obtaining history from patient or surrogate, ordering and performing treatments and interventions, ordering and review of laboratory studies, ordering and review of radiographic studies, pulse oximetry and re-evaluation of  patient's condition.   Neta Ehlers, MD 02/15/14 1950

## 2014-02-15 NOTE — Progress Notes (Signed)
NUTRITION FOLLOW UP/CONSULT FOR TF MANAGEMENT  Intervention:   - Once pt re-warmed at 1600, start TF via OGT of Vital High Protein start at 82m/hr increase by 114mevery 4 hours to goal of 4068mr. Goal rate will provide 960 calories, 84g protein, and 803m32mee water. TF plus current calories from Propofol will provide 1314 calories and meet 100% estimated calorie and protein needs. If IVF d/c, recommend 120ml20mer flushes TID - Continue adult enteral protocol  - RD to continue to monitor   Nutrition Dx:   Inadequate oral intake related to inability to eat as evidenced by NPO - ongoing    Goal:   Pt will meet >90% of estimated nutritional needs - not met   Monitor:   Weights, labs, vent status, TF tolerance/advancement  Assessment:   Pt with hx of alcohol abuse brought to ED on 7/27 after she began to have seizure like activity while watching TV at home. Boyfriend states she began shaking and eyes rolled back into her head. A few seconds later, she stopped breathing. He began CPR and dispatched EMS. In the field, she had a V.fib arrest with ROSC after epi x 1 and 1 shock. In ED, found to be hypomagnesemic, hypokalemic, hypocalcemic. 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 PCCM consulted for admission and initiation of hypothermia protocol.  Patient is currently intubated on ventilator support MV: 6.1 L/min Temp (24hrs), Avg:93.3 F (34.1 C), Min:91.2 F (32.9 C), Max:97.9 F (36.6 C)  Propofol: 13.4 ml/hr  Received consult for TF management. Pt currently in process of being re-warmed. Per conversation with RN, pt will be completely re-warmed at 1600, will start TF then.   Potassium slightly low - getting IV replacement  AST elevated    Height: Ht Readings from Last 1 Encounters:  02/13/14 5' 6.14" (1.68 m)    Weight Status:   Wt Readings from Last 1 Encounters:  02/15/14 123 lb 3.8 oz (55.9  kg)  Admit wt         110 lb 0.2 oz (49.9 kg)  Net I/Os: +1.3L  Re-estimated needs:  Kcal: 1309 Protein: 75-90g Fluid: per MD  Skin: PTA   Diet Order: NPO   Intake/Output Summary (Last 24 hours) at 02/15/14 1312 Last data filed at 02/15/14 1300  Gross per 24 hour  Intake 2986.43 ml  Output   2130 ml  Net 856.43 ml    Last BM: PTA    Labs:   Recent Labs Lab 02/13/14 2253  02/14/14 1600 02/14/14 2100 02/15/14 0100 02/15/14 0445 02/15/14 0805  NA 141  < > 142 141 143 143 142  K 3.1*  < > 3.0* 3.0* 3.6* 3.3* 3.2*  CL 96  < > 105 109 110 110 108  CO2 18*  < > 19 19 18* 18* 20  BUN 5*  < > 3* <3* <3* 3* 3*  CREATININE 0.47*  0.46*  < > 0.27* 0.23* 0.24* 0.28* 0.31*  CALCIUM 7.2*  < > 6.4* 7.1* 6.7* 6.8* 6.4*  MG 2.4  < > 1.6 2.1  --   --  1.6  PHOS 2.7  --   --   --   --   --   --   GLUCOSE 272*  < > 117* 86 86 77 235*  < > = values in this interval not displayed.  CBG (last 3)   Recent Labs  02/15/14 0834 02/15/14 1209 02/15/14  [place]*    Scheduled Meds: . adenosine      . antiseptic oral rinse  7 mL Mouth Rinse QID  . chlorhexidine  15 mL Mouth Rinse BID  . cisatracurium  0.1 mg/kg Intravenous Once  . feeding supplement (VITAL HIGH PROTEIN)  1,000 mL Per Tube Q24H  . folic acid  1 mg Intravenous Daily  . insulin aspart  2-6 Units Subcutaneous 6 times per day  . insulin glargine  10 Units Subcutaneous Q24H  . levETIRAcetam  500 mg Intravenous BID  . magnesium sulfate 1 - 4 g bolus IVPB  1 g Intravenous Once  . pantoprazole (PROTONIX) IV  40 mg Intravenous QHS  . potassium chloride  10 mEq Intravenous Q1 Hr x 2  . potassium chloride  10 mEq Intravenous Q1 Hr x 4  . thiamine  100 mg Intravenous Daily    Continuous Infusions: . cisatracurium (NIMBEX) infusion Stopped (02/15/14 1130)  . fentaNYL infusion INTRAVENOUS 250 mcg/hr (02/15/14 1200)  . midazolam (VERSED) infusion Stopped (02/15/14 1300)  . norepinephrine (LEVOPHED)  Adult infusion 20 mcg/min (02/15/14 1300)  . propofol 30 mcg/kg/min (02/15/14 1200)    Carlis Stable MS, RD, LDN [phone] Pager [phone] Weekend/After Hours Pager

## 2014-02-15 NOTE — Progress Notes (Signed)
Subjective: Patient in the process of being warmed.  Has had episodes of tachycardia overnight.  EEG monitor has not been functional.  No clincal seizure activity noted.   Objective: Current vital signs: BP 100/69  Pulse 96  Temp(Src) 97.9 F (36.6 C) (Core (Comment))  Resp 14  Ht 5' 6.14" (1.68 m)  Wt 55.9 kg (123 lb 3.8 oz)  BMI 19.81 kg/m2  SpO2 100% Vital signs in last 24 hours: Temp:  [91.2 F (32.9 C)-97.9 F (36.6 C)] 97.9 F (36.6 C) (07/30 1300) Pulse Rate:  [64-135] 96 (07/30 1300) Resp:  [14-22] 14 (07/30 1300) BP: (98-144)/(62-101) 100/69 mmHg (07/30 1300) SpO2:  [100 %] 100 % (07/30 1300) Arterial Line BP: (86-143)/(53-100) 103/63 mmHg (07/30 1300) FiO2 (%):  [30 %] 30 % (07/30 1300) Weight:  [55.9 kg (123 lb 3.8 oz)] 55.9 kg (123 lb 3.8 oz) (07/30 0439)  Intake/Output from previous day: 07/29 0701 - 07/30 0700 In: 3665.6 [I.V.:2575.6; NG/GT:70; IV Piggyback:1020] Out: 2190 [Urine:2190] Intake/Output this shift: Total I/O In: 761.1 [I.V.:451.1; IV Piggyback:310] Out: 225 [Urine:225] Nutritional status: NPO  Neurologic Exam: Mental Status:  Patient does not respond to verbal stimuli. Does not respond to deep sternal rub. Does not follow commands. No verbalizations noted.  Cranial Nerves:  II: patient does not respond confrontation bilaterally, pupils right 2 mm, left 2 mm,and reactivie bilaterally  III,IV,VI: doll's response absent bilaterally.  V,VII: corneal reflex absent bilaterally  VIII: patient does not respond to verbal stimuli  IX,X: gag reflex absent, XI: trapezius strength unable to test bilaterally  XII: tongue strength unable to test  Motor:  Extremities flaccid throughout. No spontaneous movement noted. No purposeful movements noted.  Sensory:  Does not respond to noxious stimuli in any extremity.  Deep Tendon Reflexes:  Absent throughout.  Plantars:  absent bilaterally  Cerebellar:  Unable to perform   Lab Results: Basic Metabolic  Panel:  Recent Labs Lab 02/13/14 2253  02/14/14 0900 02/14/14 1212 02/14/14 1600 02/14/14 2100 02/15/14 0100 02/15/14 0445 02/15/14 0805  NA 141  < > 142 142 142 141 143 143 142  K 3.1*  < > 3.5* 3.8 3.0* 3.0* 3.6* 3.3* 3.2*  CL 96  < > 104 105 105 109 110 110 108  CO2 18*  --  20 19 19 19  18* 18* 20  GLUCOSE 272*  < > 127* 130* 117* 86 86 77 235*  BUN 5*  < > 3* 3* 3* <3* <3* 3* 3*  CREATININE 0.47*  0.46*  < > 0.32* 0.30* 0.27* 0.23* 0.24* 0.28* 0.31*  CALCIUM 7.2*  --  5.9* 6.7* 6.4* 7.1* 6.7* 6.8* 6.4*  MG 2.4  < > 1.9 1.8 1.6 2.1  --   --  1.6  PHOS 2.7  --   --   --   --   --   --   --   --   < > = values in this interval not displayed.  Liver Function Tests:  Recent Labs Lab 02/13/14 2050 02/15/14 0805  AST 384* 64*  ALT 50* 17  ALKPHOS 171* 96  BILITOT 3.4* 0.9  PROT 7.1 4.8*  ALBUMIN 4.2 2.4*    Recent Labs Lab 02/13/14 2050  LIPASE 42    Recent Labs Lab 02/13/14 2118  AMMONIA 73*    CBC:  Recent Labs Lab 02/08/14 1635 02/13/14 2050  02/13/14 2253 02/14/14 0243 02/14/14 0343 02/14/14 0435 02/14/14 0455 02/14/14 0927 02/15/14 0445  WBC 1.9* 3.5*  --  7.6  --   --   --  6.0  --  3.8*  NEUTROABS 0.8*  --   --   --   --   --   --   --   --  3.1  HGB 10.6* 11.7*  < > 9.9* 12.6 11.9* 11.2* 9.8*  --  9.7*  HCT 32.0* 35.4*  < > 31.1* 37.0 35.0* 33.0* 30.2*  --  29.6*  MCV 88.9 90.3  --  92.6  --   --   --  91.5  --  90.0  PLT 96* 76*  --  74*  --   --   --  66* 91* 51*  < > = values in this interval not displayed.  Cardiac Enzymes: No results found for this basename: CKTOTAL, CKMB, CKMBINDEX, TROPONINI,  in the last 168 hours  Lipid Panel: No results found for this basename: CHOL, TRIG, HDL, CHOLHDL, VLDL, LDLCALC,  in the last 168 hours  CBG:  Recent Labs Lab 02/15/14 0748 02/15/14 0749 02/15/14 0834 02/15/14 1209 02/15/14 1211  GLUCAP 51* 65* 110* 3* 79*    Microbiology: Results for orders placed during the hospital  encounter of 02/13/14  MRSA PCR SCREENING     Status: None   Collection Time    02/14/14 12:27 AM      Result Value Ref Range Status   MRSA by PCR NEGATIVE  NEGATIVE Final   Comment:            The GeneXpert MRSA Assay (FDA     approved for NASAL specimens     only), is one component of a     comprehensive MRSA colonization     surveillance program. It is not     intended to diagnose MRSA     infection nor to guide or     monitor treatment for     MRSA infections.    Coagulation Studies:  Recent Labs  02/13/14 2253 02/14/14 0140 02/14/14 0920 02/14/14 0927  LABPROT 14.0 14.6 14.8 15.3*  INR 1.08 1.14 1.16 1.21    Imaging: Ct Angio Head W/cm &/or Wo Cm  02/13/2014   CLINICAL DATA:  Unresponsive.  EXAM: CT ANGIOGRAPHY HEAD  TECHNIQUE: Multidetector CT imaging of the head was performed using the standard protocol during bolus administration of intravenous contrast. Multiplanar CT image reconstructions and MIPs were obtained to evaluate the vascular anatomy.  CONTRAST:  53mL OMNIPAQUE IOHEXOL 350 MG/ML SOLN  COMPARISON:  None.  FINDINGS: CT head: The ventricles and sulci are normal. No intraparenchymal hemorrhage, mass effect nor midline shift. No acute large vascular territory infarcts. Postcontrast imaging demonstrates no abnormal parenchymal nor leptomeningeal enhancement.  No abnormal extra-axial fluid collections. Basal cisterns are patent.  No skull fracture. The included ocular globes and orbital contents are non-suspicious. The mastoid aircells and included paranasal sinuses are well-aerated. Life support lines in place, seen on the localizer.  CTA head: Anterior circulation: Normal appearance of the cervical internal carotid arteries, petrous, cavernous and supra clinoid internal carotid arteries. Widely patent anterior communicating artery. Normal appearance of the anterior and middle cerebral arteries.  Posterior circulation: Left vertebral artery is dominant, with normal  appearance of the vertebral arteries, vertebrobasilar junction and basilar artery, as well as main branch vessels. Small bilateral posterior communicating arteries are present. Normal appearance of the posterior cerebral arteries.  No large vessel occlusion, hemodynamically significant stenosis, dissection, luminal irregularity, contrast extravasation or aneurysm within the anterior nor posterior circulation.  Review of the MIP images confirms the above findings.  IMPRESSION: CT  head: No acute intracranial process, normal examination. No abnormal enhancement.  CTA head:  No acute vascular process, normal examination.   Electronically Signed   By: Elon Alas   On: 02/13/2014 22:28   US Transvaginal Non-ob  02/14/2014   CLINICAL DATA:  Pregnant, post ventricular fibrillation and arrest, reported recent miscarriage, past history of RIGHT salpingo-oophorectomy ; quantitative beta HCG < 1  EXAM: TRANSABDOMINAL AND TRANSVAGINAL ULTRASOUND OF PELVIS  TECHNIQUE: Both transabdominal and transvaginal ultrasound examinations of the pelvis were performed. Transabdominal technique was performed for global imaging of the pelvis including uterus, ovaries, adnexal regions, and pelvic cul-de-sac. It was necessary to proceed with endovaginal exam following the transabdominal exam to visualize the LEFT ovary and RIGHT adnexa.  COMPARISON:  12/22/2013  FINDINGS: Uterus  Measurements: 9.2 x 4.1 x 5.2 cm. Normal myometrial echogenicity. Small cystic structure 3 x 3 x 4 mm identified at the deep margin of the endometrial complex and myometrium, likely representing a tiny cyst in this patient with a negative pregnancy test. No additional uterine masses.  Endometrium  Thickness: 9 mm thick, normal.  No endometrial fluid.  Right ovary  Surgically absent.  Left ovary  Measurements: 2.9 x 1.5 x 3.1 cm. Normal morphology without mass.  Other findings  Moderate simple free pelvic fluid.  No adnexal masses.  IMPRESSION: Post RIGHT  salpingo-oophorectomy.  Unremarkable LEFT ovary and endometrial complex.  Tiny 3 x 3 x 4 mm diameter cyst within uterus, patient with a quantitative beta HCG < 1.   Electronically Signed   By: Lavonia Dana M.D.   On: 02/14/2014 16:41   US Pelvis Complete  02/14/2014   CLINICAL DATA:  Pregnant, post ventricular fibrillation and arrest, reported recent miscarriage, past history of RIGHT salpingo-oophorectomy ; quantitative beta HCG < 1  EXAM: TRANSABDOMINAL AND TRANSVAGINAL ULTRASOUND OF PELVIS  TECHNIQUE: Both transabdominal and transvaginal ultrasound examinations of the pelvis were performed. Transabdominal technique was performed for global imaging of the pelvis including uterus, ovaries, adnexal regions, and pelvic cul-de-sac. It was necessary to proceed with endovaginal exam following the transabdominal exam to visualize the LEFT ovary and RIGHT adnexa.  COMPARISON:  12/22/2013  FINDINGS: Uterus  Measurements: 9.2 x 4.1 x 5.2 cm. Normal myometrial echogenicity. Small cystic structure 3 x 3 x 4 mm identified at the deep margin of the endometrial complex and myometrium, likely representing a tiny cyst in this patient with a negative pregnancy test. No additional uterine masses.  Endometrium  Thickness: 9 mm thick, normal.  No endometrial fluid.  Right ovary  Surgically absent.  Left ovary  Measurements: 2.9 x 1.5 x 3.1 cm. Normal morphology without mass.  Other findings  Moderate simple free pelvic fluid.  No adnexal masses.  IMPRESSION: Post RIGHT salpingo-oophorectomy.  Unremarkable LEFT ovary and endometrial complex.  Tiny 3 x 3 x 4 mm diameter cyst within uterus, patient with a quantitative beta HCG < 1.   Electronically Signed   By: Lavonia Dana M.D.   On: 02/14/2014 16:41   Dg Chest Port 1 View  02/15/2014   CLINICAL DATA:  Assess endotracheal tube positioning  EXAM: PORTABLE CHEST - 1 VIEW  COMPARISON:  Portable chest x-ray of February 14, 2014  FINDINGS: The lungs are mildly hyperinflated and clear. The  heart and mediastinal structures are normal. There is no pleural effusion or pneumothorax. The endotracheal tube tip lies 3.2 cm above the crotch of the carina. The esophagogastric tube tip projects off the inferior margin of the image.  The right internal jugular venous catheter tip lies in the midportion of the SVC. External pacemaker pads are present.  IMPRESSION: The support tubes and lines are in appropriate position. No acute cardiopulmonary abnormality is demonstrated.   Electronically Signed   By: David  Martinique   On: 02/15/2014 07:16   Dg Chest Port 1 View  02/14/2014   CLINICAL DATA:  Central line placement.  EXAM: PORTABLE CHEST - 1 VIEW  COMPARISON:  02/13/2014  FINDINGS: Interval placement of a right central venous catheter with tip over the cavoatrial junction. No pneumothorax. Endotracheal tube appears to the advanced slightly with tip now the origin of the right mainstem bronchus. New placement of an enteric tube. The tip is off the field of view but below the left hemidiaphragm. Normal heart size and pulmonary vascularity. Lungs are clear and expanded.  IMPRESSION: Right central venous catheter tip localizes over the cavoatrial junction. No pneumothorax. Endotracheal tube tip now appears to reside in the origin of the right mainstem bronchus.  These results were called by telephone at the time of interpretation on 02/14/2014 at 1:12 am to the patient's nurse, [place], who verbally acknowledged these results.   Electronically Signed   By: Lucienne Capers M.D.   On: 02/14/2014 01:14   Dg Chest Port 1 View  02/13/2014   CLINICAL DATA:  Post CPR  EXAM: PORTABLE CHEST - 1 VIEW  COMPARISON:  Prior radiograph from 12/01/2013  FINDINGS: Defibrillator pads overlie the thorax. Patient is intubated with the tip of the endotracheal tube approximately 1 cm above the carina. Mediastinal silhouette within normal limits.  The lungs are normally inflated. No airspace consolidation, pleural effusion, or pulmonary  edema is identified. There is no pneumothorax.  No acute osseous abnormality identified.  IMPRESSION: 1. Tip of endotracheal tube somewhat low lying just above the carina. Retraction by 2 cm could be considered to avoid slippage into the mainstem bronchi. 2. No acute cardiopulmonary abnormality identified. Results were called by telephone at the time of interpretation on 02/13/2014 at 9:13 pm to Dr. Claudean Severance , who verbally acknowledged these results.   Electronically Signed   By: Jeannine Boga M.D.   On: 02/13/2014 21:13    Medications:  I have reviewed the patient's current medications. Scheduled: . adenosine      . antiseptic oral rinse  7 mL Mouth Rinse QID  . chlorhexidine  15 mL Mouth Rinse BID  . cisatracurium  0.1 mg/kg Intravenous Once  . feeding supplement (VITAL HIGH PROTEIN)  1,000 mL Per Tube Q24H  . folic acid  1 mg Intravenous Daily  . insulin aspart  2-6 Units Subcutaneous 6 times per day  . insulin glargine  10 Units Subcutaneous Q24H  . levETIRAcetam  1,000 mg Intravenous BID  . levETIRAcetam  500 mg Intravenous Once  . magnesium sulfate 1 - 4 g bolus IVPB  1 g Intravenous Once  . pantoprazole (PROTONIX) IV  40 mg Intravenous QHS  . potassium chloride  10 mEq Intravenous Q1 Hr x 2  . potassium chloride  10 mEq Intravenous Q1 Hr x 4  . thiamine  100 mg Intravenous Daily    Assessment/Plan: No seizure activity noted.  Patient being warmed.  EEG activity improving.  No epileptiform activity noted.  Remains on Keppra.    Recommendations: 1.  EEG monitoring now restarted.   2.  Continue Keppra at current dose 3.  Will continue to follow with you   LOS: 2 days   Alexis Goodell,  MD [facility] [phone] 02/15/2014  1:15 PM

## 2014-02-15 NOTE — Progress Notes (Signed)
Patient ID: JANAE BONSER, female   DOB: [birth year], 38 y.o.   MRN: [id number]   SUBJECTIVE: Patient remains sedated, cooled.  Stable overnight on low dose of norepinephrine.  Scheduled Meds: . antiseptic oral rinse  7 mL Mouth Rinse QID  . artificial tears  1 application Both Eyes 3 times per day  . chlorhexidine  15 mL Mouth Rinse BID  . cisatracurium  0.1 mg/kg Intravenous Once  . folic acid  1 mg Intravenous Daily  . insulin aspart  2-6 Units Subcutaneous 6 times per day  . insulin glargine  10 Units Subcutaneous Q24H  . levETIRAcetam  500 mg Intravenous BID  . pantoprazole (PROTONIX) IV  40 mg Intravenous QHS  . thiamine  100 mg Intravenous Daily   Continuous Infusions: . sodium chloride 75 mL/hr at 02/15/14 0700  . cisatracurium (NIMBEX) infusion 1.5 mcg/kg/min (02/15/14 0730)  . fentaNYL infusion INTRAVENOUS 250 mcg/hr (02/15/14 0730)  . midazolam (VERSED) infusion 4 mg/hr (02/15/14 0730)  . norepinephrine (LEVOPHED) Adult infusion 5 mcg/min (02/15/14 0700)   PRN Meds:.sodium chloride    Filed Vitals:   02/15/14 0727 02/15/14 0752 02/15/14 0800 02/15/14 0900  BP:  116/88 99/75 106/79  Pulse: 135 78 93 82  Temp:   95 F (35 C) 95.7 F (35.4 C)  TempSrc:   Core (Comment)   Resp: 22  20 20   Height:      Weight:      SpO2: 100% 100% 100% 100%    Intake/Output Summary (Last 24 hours) at 02/15/14 0917 Last data filed at 02/15/14 0800  Gross per 24 hour  Intake 3427.45 ml  Output   2055 ml  Net 1372.45 ml    LABS: Basic Metabolic Panel:  Recent Labs  02/13/14 2253  02/14/14 1600 02/14/14 2100  02/15/14 0445 02/15/14 0805  NA 141  < > 142 141  < > 143 142  K 3.1*  < > 3.0* 3.0*  < > 3.3* 3.2*  CL 96  < > 105 109  < > 110 108  CO2 18*  < > 19 19  < > 18* 20  GLUCOSE 272*  < > 117* 86  < > 77 235*  BUN 5*  < > 3* <3*  < > 3* 3*  CREATININE 0.47*  0.46*  < > 0.27* 0.23*  < > 0.28* 0.31*  CALCIUM 7.2*  < > 6.4* 7.1*  < > 6.8* 6.4*  MG 2.4  < > 1.6 2.1  --    --   --   PHOS 2.7  --   --   --   --   --   --   < > = values in this interval not displayed. Liver Function Tests:  Recent Labs  02/13/14 2050  AST 384*  ALT 50*  ALKPHOS 171*  BILITOT 3.4*  PROT 7.1  ALBUMIN 4.2    Recent Labs  02/13/14 2050  LIPASE 42   CBC:  Recent Labs  02/14/14 0455 02/14/14 0927 02/15/14 0445  WBC 6.0  --  3.8*  NEUTROABS  --   --  3.1  HGB 9.8*  --  9.7*  HCT 30.2*  --  29.6*  MCV 91.5  --  90.0  PLT 66* 91* 51*   Cardiac Enzymes: No results found for this basename: CKTOTAL, CKMB, CKMBINDEX, TROPONINI,  in the last 72 hours BNP: No components found with this basename: POCBNP,  D-Dimer:  Recent Labs  02/14/14 0927  DDIMER 0.83*  Hemoglobin A1C: No results found for this basename: HGBA1C,  in the last 72 hours Fasting Lipid Panel: No results found for this basename: CHOL, HDL, LDLCALC, TRIG, CHOLHDL, LDLDIRECT,  in the last 72 hours Thyroid Function Tests: No results found for this basename: TSH, T4TOTAL, FREET3, T3FREE, THYROIDAB,  in the last 72 hours Anemia Panel: No results found for this basename: VITAMINB12, FOLATE, FERRITIN, TIBC, IRON, RETICCTPCT,  in the last 72 hours  RADIOLOGY: Ct Angio Head W/cm &/or Wo Cm  02/13/2014   CLINICAL DATA:  Unresponsive.  EXAM: CT ANGIOGRAPHY HEAD  TECHNIQUE: Multidetector CT imaging of the head was performed using the standard protocol during bolus administration of intravenous contrast. Multiplanar CT image reconstructions and MIPs were obtained to evaluate the vascular anatomy.  CONTRAST:  76mL OMNIPAQUE IOHEXOL 350 MG/ML SOLN  COMPARISON:  None.  FINDINGS: CT head: The ventricles and sulci are normal. No intraparenchymal hemorrhage, mass effect nor midline shift. No acute large vascular territory infarcts. Postcontrast imaging demonstrates no abnormal parenchymal nor leptomeningeal enhancement.  No abnormal extra-axial fluid collections. Basal cisterns are patent.  No skull fracture. The  included ocular globes and orbital contents are non-suspicious. The mastoid aircells and included paranasal sinuses are well-aerated. Life support lines in place, seen on the localizer.  CTA head: Anterior circulation: Normal appearance of the cervical internal carotid arteries, petrous, cavernous and supra clinoid internal carotid arteries. Widely patent anterior communicating artery. Normal appearance of the anterior and middle cerebral arteries.  Posterior circulation: Left vertebral artery is dominant, with normal appearance of the vertebral arteries, vertebrobasilar junction and basilar artery, as well as main branch vessels. Small bilateral posterior communicating arteries are present. Normal appearance of the posterior cerebral arteries.  No large vessel occlusion, hemodynamically significant stenosis, dissection, luminal irregularity, contrast extravasation or aneurysm within the anterior nor posterior circulation.  Review of the MIP images confirms the above findings.  IMPRESSION: CT head: No acute intracranial process, normal examination. No abnormal enhancement.  CTA head:  No acute vascular process, normal examination.   Electronically Signed   By: Elon Alas   On: 02/13/2014 22:28   Dg Ankle Complete Left  02/08/2014   CLINICAL DATA:  Left ankle pain and discoloration for 1 day.  EXAM: LEFT ANKLE COMPLETE - 3+ VIEW  COMPARISON:  None.  FINDINGS: No acute bony or joint abnormality is identified. Small osteophyte off the dorsal aspect of the proximal navicular bone is noted. Soft tissue structures appear normal.  IMPRESSION: Negative exam.   Electronically Signed   By: Inge Rise M.D.   On: 02/08/2014 16:20   US Transvaginal Non-ob  02/14/2014   CLINICAL DATA:  Pregnant, post ventricular fibrillation and arrest, reported recent miscarriage, past history of RIGHT salpingo-oophorectomy ; quantitative beta HCG < 1  EXAM: TRANSABDOMINAL AND TRANSVAGINAL ULTRASOUND OF PELVIS  TECHNIQUE: Both  transabdominal and transvaginal ultrasound examinations of the pelvis were performed. Transabdominal technique was performed for global imaging of the pelvis including uterus, ovaries, adnexal regions, and pelvic cul-de-sac. It was necessary to proceed with endovaginal exam following the transabdominal exam to visualize the LEFT ovary and RIGHT adnexa.  COMPARISON:  12/22/2013  FINDINGS: Uterus  Measurements: 9.2 x 4.1 x 5.2 cm. Normal myometrial echogenicity. Small cystic structure 3 x 3 x 4 mm identified at the deep margin of the endometrial complex and myometrium, likely representing a tiny cyst in this patient with a negative pregnancy test. No additional uterine masses.  Endometrium  Thickness: 9 mm thick, normal.  No endometrial fluid.  Right ovary  Surgically absent.  Left ovary  Measurements: 2.9 x 1.5 x 3.1 cm. Normal morphology without mass.  Other findings  Moderate simple free pelvic fluid.  No adnexal masses.  IMPRESSION: Post RIGHT salpingo-oophorectomy.  Unremarkable LEFT ovary and endometrial complex.  Tiny 3 x 3 x 4 mm diameter cyst within uterus, patient with a quantitative beta HCG < 1.   Electronically Signed   By: Lavonia Dana M.D.   On: 02/14/2014 16:41   US Pelvis Complete  02/14/2014   CLINICAL DATA:  Pregnant, post ventricular fibrillation and arrest, reported recent miscarriage, past history of RIGHT salpingo-oophorectomy ; quantitative beta HCG < 1  EXAM: TRANSABDOMINAL AND TRANSVAGINAL ULTRASOUND OF PELVIS  TECHNIQUE: Both transabdominal and transvaginal ultrasound examinations of the pelvis were performed. Transabdominal technique was performed for global imaging of the pelvis including uterus, ovaries, adnexal regions, and pelvic cul-de-sac. It was necessary to proceed with endovaginal exam following the transabdominal exam to visualize the LEFT ovary and RIGHT adnexa.  COMPARISON:  12/22/2013  FINDINGS: Uterus  Measurements: 9.2 x 4.1 x 5.2 cm. Normal myometrial echogenicity. Small  cystic structure 3 x 3 x 4 mm identified at the deep margin of the endometrial complex and myometrium, likely representing a tiny cyst in this patient with a negative pregnancy test. No additional uterine masses.  Endometrium  Thickness: 9 mm thick, normal.  No endometrial fluid.  Right ovary  Surgically absent.  Left ovary  Measurements: 2.9 x 1.5 x 3.1 cm. Normal morphology without mass.  Other findings  Moderate simple free pelvic fluid.  No adnexal masses.  IMPRESSION: Post RIGHT salpingo-oophorectomy.  Unremarkable LEFT ovary and endometrial complex.  Tiny 3 x 3 x 4 mm diameter cyst within uterus, patient with a quantitative beta HCG < 1.   Electronically Signed   By: Lavonia Dana M.D.   On: 02/14/2014 16:41   Dg Chest Port 1 View  02/15/2014   CLINICAL DATA:  Assess endotracheal tube positioning  EXAM: PORTABLE CHEST - 1 VIEW  COMPARISON:  Portable chest x-ray of February 14, 2014  FINDINGS: The lungs are mildly hyperinflated and clear. The heart and mediastinal structures are normal. There is no pleural effusion or pneumothorax. The endotracheal tube tip lies 3.2 cm above the crotch of the carina. The esophagogastric tube tip projects off the inferior margin of the image. The right internal jugular venous catheter tip lies in the midportion of the SVC. External pacemaker pads are present.  IMPRESSION: The support tubes and lines are in appropriate position. No acute cardiopulmonary abnormality is demonstrated.   Electronically Signed   By: David  Martinique   On: 02/15/2014 07:16   Dg Chest Port 1 View  02/14/2014   CLINICAL DATA:  Central line placement.  EXAM: PORTABLE CHEST - 1 VIEW  COMPARISON:  02/13/2014  FINDINGS: Interval placement of a right central venous catheter with tip over the cavoatrial junction. No pneumothorax. Endotracheal tube appears to the advanced slightly with tip now the origin of the right mainstem bronchus. New placement of an enteric tube. The tip is off the field of view but below  the left hemidiaphragm. Normal heart size and pulmonary vascularity. Lungs are clear and expanded.  IMPRESSION: Right central venous catheter tip localizes over the cavoatrial junction. No pneumothorax. Endotracheal tube tip now appears to reside in the origin of the right mainstem bronchus.  These results were called by telephone at the time of interpretation on 02/14/2014 at 1:12 am  to the patient's nurse, [place], who verbally acknowledged these results.   Electronically Signed   By: Lucienne Capers M.D.   On: 02/14/2014 01:14   Dg Chest Port 1 View  02/13/2014   CLINICAL DATA:  Post CPR  EXAM: PORTABLE CHEST - 1 VIEW  COMPARISON:  Prior radiograph from 12/01/2013  FINDINGS: Defibrillator pads overlie the thorax. Patient is intubated with the tip of the endotracheal tube approximately 1 cm above the carina. Mediastinal silhouette within normal limits.  The lungs are normally inflated. No airspace consolidation, pleural effusion, or pulmonary edema is identified. There is no pneumothorax.  No acute osseous abnormality identified.  IMPRESSION: 1. Tip of endotracheal tube somewhat low lying just above the carina. Retraction by 2 cm could be considered to avoid slippage into the mainstem bronchi. 2. No acute cardiopulmonary abnormality identified. Results were called by telephone at the time of interpretation on 02/13/2014 at 9:13 pm to Dr. Claudean Severance , who verbally acknowledged these results.   Electronically Signed   By: Jeannine Boga M.D.   On: 02/13/2014 21:13   Dg Hand Complete Left  01/29/2014   CLINICAL DATA:  Traumatic injury and pain  EXAM: LEFT HAND - COMPLETE 3+ VIEW  COMPARISON:  None.  FINDINGS: Mild degenerate changes are noted at the first metacarpophalangeal joint. No acute fracture or dislocation is seen. No gross soft tissue abnormality is noted.  IMPRESSION: Degenerative change without acute abnormality.   Electronically Signed   By: Inez Catalina M.D.   On: 01/29/2014 12:39     PHYSICAL EXAM General: Intubated/sedated Neck: No JVD, no thyromegaly or thyroid nodule.  Lungs: Clear to auscultation bilaterally with normal respiratory effort. CV: Nondisplaced PMI.  Heart regular S1/S2, no S3/S4, no murmur.  No peripheral edema.   Abdomen: Soft, nontender, no hepatosplenomegaly, no distention.  Neurologic: Sedated Extremities: No clubbing or cyanosis.   TELEMETRY: Reviewed telemetry pt in NSR  ASSESSMENT AND PLAN: 38 yo with history of depression, prior electrolyte abnormalities had ventricular fibrillation arrest (cardioverted) and now undergoing hypothermia protocol.  1. Ventricular fibrillation: Patient has had prolonged QT interval over multiple prior ECGs.  She was hypokalemic, hypomagnesemic, and hypocalcemic on admission.  Suspect torsades in the setting of electrolyte abnormalities and also use of trazodone and fluoxetine.  She also is noted to have a cardiomyopathy on her post-arrest echo.  - Replace all electrolytes, stop fluoxetine and trazodone.  Will need to check ECG after electrolyte replacement and rewarming (as hypothermia will prolong QT) to see if QT interval corrects.  - Would start beta blocker when rewarmed with stable BP. 2. Cardiomyopathy: EF 20-25% with wall motion abnormalities (anteroseptal severe hypokinesis and inferobasal dyskinesis).  LV not dilated and atria normal in size, suggesting relative new onset to the cardiomyopathy.  It is possible this may be stunning or stress cardiomyopathy related to the arrest, but I am concerned with the wall motion abnormalities.  If she has neurologic recovery, will need cardiac cath to rule out CAD.  If this is normal, would lean towards cardiac MRI to assess for evidence of cardiac sarcoidosis.   Loralie Champagne 02/15/2014 9:24 AM

## 2014-02-15 NOTE — Progress Notes (Signed)
CRITICAL VALUE ALERT  Critical value received:  Cal 6.4  Date of notification:  02/15/14  Time of notification:  0910  Critical value read back:Yes.    Nurse who received alert: Rudene Anda, RN  MD notified (1st page):  McQuaid  Time of first page:  At bedside 0910  MD notified (2nd page):  Time of second page:  Responding MD:  [place]ells  Time MD responded:  [phone]

## 2014-02-15 NOTE — Progress Notes (Signed)
Attempted to call social work to try and get ahold of pt's husband, who is a Doctor, general practice at [employer].  Left message with social work.  Awaiting call back.

## 2014-02-15 NOTE — Progress Notes (Signed)
eLink Physician-Brief Progress Note Patient Name: Kristen Roberson DOB: Apr 08, 1976 MRN: [id number]  Date of Service  02/15/2014   HPI/Events of Note   Pt having discomfort not improved with fentanyl.  eICU Interventions   Will order prn dilaudid.   Intervention Category Major Interventions: Other:  Gillis Boardley 02/15/2014, 11:27 PM

## 2014-02-15 NOTE — Progress Notes (Signed)
Hypoglycemic Event  CBG: 51  Treatment: D50 IV 25 mL  Symptoms: None  Follow-up CBG: [id number]:[id number] CBG Result:110  Possible Reasons for Event: Inadequate meal intake  Comments/MD notified:McQuaid    Mcarthur Rossetti  Remember to initiate Hypoglycemia Order Set & complete

## 2014-02-15 NOTE — Progress Notes (Addendum)
[facility]  Episode of pulseless VTach, converted spontaneously after 8-9 seconds, no drugs given.   Now sinus tach, HR initially 170, decreased to 140  Initially she had what was thought to be seizure like activity, but I think this was more likely abnormal movements while she was agitated and coming out of the effects of neuromuscular blockade.  Will add propofol temporarily for comfort, then if her HR calms down will try to extubate. Replete K, Mg aggressively.  Additional CC time 40 minutes  Roselie Awkward, MD [facility] Pager: [phone] Cell: [phone] If no response, call [phone]

## 2014-02-16 ENCOUNTER — Inpatient Hospital Stay (HOSPITAL_COMMUNITY): Payer: Self-pay

## 2014-02-16 DIAGNOSIS — G621 Alcoholic polyneuropathy: Secondary | ICD-10-CM

## 2014-02-16 LAB — CBC WITH DIFFERENTIAL/PLATELET
BASOS ABS: 0 10*3/uL (ref 0.0–0.1)
Basophils Relative: 0 % (ref 0–1)
Eosinophils Absolute: 0 10*3/uL (ref 0.0–0.7)
Eosinophils Relative: 0 % (ref 0–5)
HEMATOCRIT: 25.7 % — AB (ref 36.0–46.0)
HEMOGLOBIN: 8.1 g/dL — AB (ref 12.0–15.0)
LYMPHS PCT: 15 % (ref 12–46)
Lymphs Abs: 0.6 10*3/uL — ABNORMAL LOW (ref 0.7–4.0)
MCH: 29.5 pg (ref 26.0–34.0)
MCHC: 31.5 g/dL (ref 30.0–36.0)
MCV: 93.5 fL (ref 78.0–100.0)
MONO ABS: 0.2 10*3/uL (ref 0.1–1.0)
MONOS PCT: 6 % (ref 3–12)
Neutro Abs: 2.9 10*3/uL (ref 1.7–7.7)
Neutrophils Relative %: 79 % — ABNORMAL HIGH (ref 43–77)
Platelets: 50 10*3/uL — ABNORMAL LOW (ref 150–400)
RBC: 2.75 MIL/uL — ABNORMAL LOW (ref 3.87–5.11)
RDW: 18.1 % — ABNORMAL HIGH (ref 11.5–15.5)
WBC: 3.6 10*3/uL — AB (ref 4.0–10.5)

## 2014-02-16 LAB — MAGNESIUM
MAGNESIUM: 1.4 mg/dL — AB (ref 1.5–2.5)
MAGNESIUM: 1.3 mg/dL — AB (ref 1.5–2.5)
MAGNESIUM: 2.2 mg/dL (ref 1.5–2.5)
Magnesium: 1.6 mg/dL (ref 1.5–2.5)
Magnesium: 2.2 mg/dL (ref 1.5–2.5)

## 2014-02-16 LAB — URINE DRUGS OF ABUSE SCREEN W ALC, ROUTINE (REF LAB)
AMPHETAMINE SCRN UR: NEGATIVE
Barbiturate Quant, Ur: NEGATIVE
Benzodiazepines.: POSITIVE — AB
Cocaine Metabolites: NEGATIVE
Creatinine,U: 74.1 mg/dL
Ethyl Alcohol: <10 mg/dL (ref <10)
MARIJUANA METABOLITE: NEGATIVE
Methadone: NEGATIVE
OPIATE SCREEN, URINE: NEGATIVE
PROPOXYPHENE: NEGATIVE
Phencyclidine (PCP): NEGATIVE

## 2014-02-16 LAB — BASIC METABOLIC PANEL
ANION GAP: 8 (ref 5–15)
Anion gap: 13 (ref 5–15)
Anion gap: 10 (ref 5–15)
Anion gap: 10 (ref 5–15)
Anion gap: 10 (ref 5–15)
BUN: <3 mg/dL — ABNORMAL LOW (ref 6–23)
BUN: <3 mg/dL — ABNORMAL LOW (ref 6–23)
BUN: <3 mg/dL — ABNORMAL LOW (ref 6–23)
CHLORIDE: 109 meq/L (ref 96–112)
CHLORIDE: 103 meq/L (ref 96–112)
CO2: 24 mEq/L (ref 19–32)
CO2: 23 mEq/L (ref 19–32)
CO2: 24 mEq/L (ref 19–32)
CO2: 22 meq/L (ref 19–32)
CO2: 24 mEq/L (ref 19–32)
CREATININE: 0.35 mg/dL — AB (ref 0.50–1.10)
CREATININE: 0.37 mg/dL — AB (ref 0.50–1.10)
Calcium: 7 mg/dL — ABNORMAL LOW (ref 8.4–10.5)
Calcium: 7.4 mg/dL — ABNORMAL LOW (ref 8.4–10.5)
Calcium: 7.3 mg/dL — ABNORMAL LOW (ref 8.4–10.5)
Calcium: 7.4 mg/dL — ABNORMAL LOW (ref 8.4–10.5)
Calcium: 7.3 mg/dL — ABNORMAL LOW (ref 8.4–10.5)
Chloride: 106 mEq/L (ref 96–112)
Chloride: 107 mEq/L (ref 96–112)
Chloride: 107 mEq/L (ref 96–112)
Creatinine, Ser: 0.41 mg/dL — ABNORMAL LOW (ref 0.50–1.10)
Creatinine, Ser: 0.37 mg/dL — ABNORMAL LOW (ref 0.50–1.10)
Creatinine, Ser: 0.37 mg/dL — ABNORMAL LOW (ref 0.50–1.10)
GFR calc Af Amer: >90 mL/min (ref >90)
GFR calc Af Amer: >90 mL/min (ref >90)
GFR calc non Af Amer: >90 mL/min (ref >90)
GLUCOSE: 103 mg/dL — AB (ref 70–99)
GLUCOSE: 171 mg/dL — AB (ref 70–99)
Glucose, Bld: 99 mg/dL (ref 70–99)
Glucose, Bld: 117 mg/dL — ABNORMAL HIGH (ref 70–99)
Glucose, Bld: 110 mg/dL — ABNORMAL HIGH (ref 70–99)
POTASSIUM: 3.7 meq/L (ref 3.7–5.3)
POTASSIUM: 3.9 meq/L (ref 3.7–5.3)
POTASSIUM: 3.9 meq/L (ref 3.7–5.3)
POTASSIUM: 3.5 meq/L — AB (ref 3.7–5.3)
Potassium: 3.7 mEq/L (ref 3.7–5.3)
SODIUM: 140 meq/L (ref 137–147)
Sodium: 139 mEq/L (ref 137–147)
Sodium: 139 mEq/L (ref 137–147)
Sodium: 141 mEq/L (ref 137–147)
Sodium: 141 mEq/L (ref 137–147)

## 2014-02-16 LAB — GLUCOSE, CAPILLARY
Glucose-Capillary: 103 mg/dL — ABNORMAL HIGH (ref 70–99)
Glucose-Capillary: 108 mg/dL — ABNORMAL HIGH (ref 70–99)
Glucose-Capillary: 126 mg/dL — ABNORMAL HIGH (ref 70–99)
Glucose-Capillary: 82 mg/dL (ref 70–99)
Glucose-Capillary: 89 mg/dL (ref 70–99)
Glucose-Capillary: 95 mg/dL (ref 70–99)

## 2014-02-16 MED ORDER — DEXMEDETOMIDINE HCL IN NACL 400 MCG/100ML IV SOLN
0.4000 ug/kg/h | INTRAVENOUS | Status: DC
  Administered 2014-02-16: 0.5 ug/kg/h via INTRAVENOUS
  Administered 2014-02-16: 0.3 ug/kg/h via INTRAVENOUS
  Administered 2014-02-16: 0.5 ug/kg/h via INTRAVENOUS
  Administered 2014-02-16: 0.7 ug/kg/h via INTRAVENOUS
  Administered 2014-02-17: 0.5 ug/kg/h via INTRAVENOUS
  Filled 2014-02-16 (×2): qty 100
  Filled 2014-02-16: qty 50
  Filled 2014-02-16: qty 100
  Filled 2014-02-16: qty 50

## 2014-02-16 MED ORDER — MAGNESIUM SULFATE 4000MG/100ML IJ SOLN
4.0000 g | Freq: Once | INTRAMUSCULAR | Status: AC
  Administered 2014-02-16: 4 g via INTRAVENOUS
  Filled 2014-02-16: qty 100

## 2014-02-16 MED ORDER — LORAZEPAM 2 MG/ML IJ SOLN
1.0000 mg | INTRAMUSCULAR | Status: DC | PRN
  Administered 2014-02-16 – 2014-02-19 (×10): 2 mg via INTRAVENOUS
  Filled 2014-02-16 (×10): qty 1

## 2014-02-16 MED ORDER — POTASSIUM CHLORIDE 10 MEQ/50ML IV SOLN
10.0000 meq | INTRAVENOUS | Status: AC
  Administered 2014-02-16 (×4): 10 meq via INTRAVENOUS
  Filled 2014-02-16 (×4): qty 50

## 2014-02-16 MED ORDER — POTASSIUM CHLORIDE 10 MEQ/50ML IV SOLN
10.0000 meq | INTRAVENOUS | Status: AC
  Administered 2014-02-16 – 2014-02-17 (×4): 10 meq via INTRAVENOUS
  Filled 2014-02-16 (×4): qty 50

## 2014-02-16 MED ORDER — CETYLPYRIDINIUM CHLORIDE 0.05 % MT LIQD
7.0000 mL | Freq: Four times a day (QID) | OROMUCOSAL | Status: DC
  Administered 2014-02-17 – 2014-02-19 (×9): 7 mL via OROMUCOSAL

## 2014-02-16 MED ORDER — LORAZEPAM 2 MG/ML IJ SOLN
INTRAMUSCULAR | Status: AC
  Filled 2014-02-16: qty 1

## 2014-02-16 MED ORDER — DEXTROSE 5 % IV SOLN
6.0000 g | Freq: Once | INTRAVENOUS | Status: DC
  Filled 2014-02-16: qty 12

## 2014-02-16 MED ORDER — MAGNESIUM SULFATE 40 MG/ML IJ SOLN
2.0000 g | Freq: Once | INTRAMUSCULAR | Status: AC
  Administered 2014-02-16: 2 g via INTRAVENOUS
  Filled 2014-02-16: qty 50

## 2014-02-16 NOTE — Progress Notes (Signed)
[facility] Progress Note Patient Name: Kristen Roberson DOB: 03/26/1976 MRN: [id number]  Date of Service  02/16/2014   HPI/Events of Note   K 3.5.  Goal K > 4.  eICU Interventions   Will give 10 meq KCL IV x 4 runs (pt unable to take oral meds at this time).    Intervention Category Major Interventions: Other:  [name] 02/16/2014, 10:23 PM

## 2014-02-16 NOTE — Progress Notes (Addendum)
Subjective: Patient improved today.  Extubated.  Follows commands.  No clinical seizure activity noted.   Objective: Current vital signs: BP 114/79  Pulse 107  Temp(Src) 99 F (37.2 C) (Core (Comment))  Resp 11  Ht 5\' 6"  (1.676 m)  Wt 60.9 kg (134 lb 4.2 oz)  BMI 21.68 kg/m2  SpO2 100% Vital signs in last 24 hours: Temp:  [96.6 F (35.9 C)-99.5 F (37.5 C)] 99 F (37.2 C) (07/31 0600) Pulse Rate:  [84-164] 107 (07/31 1000) Resp:  [9-32] 11 (07/31 1000) BP: (59-140)/(30-95) 114/79 mmHg (07/31 1000) SpO2:  [96 %-100 %] 100 % (07/31 1000) Arterial Line BP: (59-129)/(30-83) 119/71 mmHg (07/31 1000) FiO2 (%):  [30 %] 30 % (07/31 0759) Weight:  [60.9 kg (134 lb 4.2 oz)] 60.9 kg (134 lb 4.2 oz) (07/31 0400)  Intake/Output from previous day: 07/30 0701 - 07/31 0700 In: 2595.9 [I.V.:1625.9; NG/GT:310; IV Piggyback:660] Out: 0630 [Urine:1040] Intake/Output this shift: Total I/O In: 66.4 [I.V.:66.4] Out: 215 [Urine:215] Nutritional status: NPO  Neurologic Exam: Mental Status: Awake and alert.  Follows commands.  Speech fluent but raspy.   Cranial Nerves: II: Discs flat bilaterally; Visual fields grossly normal, pupils equal, round, reactive to light and accommodation III,IV, VI: ptosis not present, extra-ocular motions intact bilaterally V,VII: smile symmetric, facial light touch sensation normal bilaterally VIII: hearing normal bilaterally IX,X: gag reflex present XI: bilateral shoulder shrug XII: midline tongue extension Motor: Moves all extremities against gravity equally Sensory: Sensation intact throughout, bilaterally Plantars: Right: downgoing   Left: downgoing  Lab Results: Basic Metabolic Panel:  Recent Labs Lab 02/13/14 2253  02/14/14 2100  02/15/14 0445 02/15/14 0805 02/15/14 1728 02/15/14 2335 02/16/14 0800  NA 141  < > 141  < > 143 142 140 139 140  K 3.1*  < > 3.0*  < > 3.3* 3.2* 4.5 3.7 3.7  CL 96  < > 109  < > 110 108 106 109 103  CO2 18*  < >  19  < > 18* 20 21 22 24   GLUCOSE 272*  < > 86  < > 77 235* 104* 171* 99  BUN 5*  < > <3*  < > 3* 3* 3* <3* <3*  CREATININE 0.47*  0.46*  < > 0.23*  < > 0.28* 0.31* 0.42* 0.37* 0.41*  CALCIUM 7.2*  < > 7.1*  < > 6.8* 6.4* 7.2* 7.0* 7.4*  MG 2.4  < > 2.1  --   --  1.6 1.9 1.6 1.4*  PHOS 2.7  --   --   --   --   --   --   --   --   < > = values in this interval not displayed.  Liver Function Tests:  Recent Labs Lab 02/13/14 2050 02/15/14 0805  AST 384* 64*  ALT 50* 17  ALKPHOS 171* 96  BILITOT 3.4* 0.9  PROT 7.1 4.8*  ALBUMIN 4.2 2.4*    Recent Labs Lab 02/13/14 2050  LIPASE 42    Recent Labs Lab 02/13/14 2118  AMMONIA 73*    CBC:  Recent Labs Lab 02/13/14 2050  02/13/14 2253  02/14/14 0343 02/14/14 0435 02/14/14 0455 02/14/14 0927 02/15/14 0445 02/16/14 0525  WBC 3.5*  --  7.6  --   --   --  6.0  --  3.8* 3.6*  NEUTROABS  --   --   --   --   --   --   --   --  3.1 2.9  HGB 11.7*  < > 9.9*  < > 11.9* 11.2* 9.8*  --  9.7* 8.1*  HCT 35.4*  < > 31.1*  < > 35.0* 33.0* 30.2*  --  29.6* 25.7*  MCV 90.3  --  92.6  --   --   --  91.5  --  90.0 93.5  PLT 76*  --  74*  --   --   --  66* 91* 51* 50*  < > = values in this interval not displayed.  Cardiac Enzymes: No results found for this basename: CKTOTAL, CKMB, CKMBINDEX, TROPONINI,  in the last 168 hours  Lipid Panel: No results found for this basename: CHOL, TRIG, HDL, CHOLHDL, VLDL, LDLCALC,  in the last 168 hours  CBG:  Recent Labs Lab 02/15/14 1929 02/15/14 2330 02/16/14 0006 02/16/14 0326 02/16/14 0749  GLUCAP 79 66* 95 103* 4    Microbiology: Results for orders placed during the hospital encounter of 02/13/14  MRSA PCR SCREENING     Status: None   Collection Time    02/14/14 12:27 AM      Result Value Ref Range Status   MRSA by PCR NEGATIVE  NEGATIVE Final   Comment:            The GeneXpert MRSA Assay (FDA     approved for NASAL specimens     only), is one component of a      comprehensive MRSA colonization     surveillance program. It is not     intended to diagnose MRSA     infection nor to guide or     monitor treatment for     MRSA infections.    Coagulation Studies:  Recent Labs  02/13/14 2253 02/14/14 0140 02/14/14 0920 02/14/14 0927  LABPROT 14.0 14.6 14.8 15.3*  INR 1.08 1.14 1.16 1.21    Imaging: US Transvaginal Non-ob  02/14/2014   CLINICAL DATA:  Pregnant, post ventricular fibrillation and arrest, reported recent miscarriage, past history of RIGHT salpingo-oophorectomy ; quantitative beta HCG < 1  EXAM: TRANSABDOMINAL AND TRANSVAGINAL ULTRASOUND OF PELVIS  TECHNIQUE: Both transabdominal and transvaginal ultrasound examinations of the pelvis were performed. Transabdominal technique was performed for global imaging of the pelvis including uterus, ovaries, adnexal regions, and pelvic cul-de-sac. It was necessary to proceed with endovaginal exam following the transabdominal exam to visualize the LEFT ovary and RIGHT adnexa.  COMPARISON:  12/22/2013  FINDINGS: Uterus  Measurements: 9.2 x 4.1 x 5.2 cm. Normal myometrial echogenicity. Small cystic structure 3 x 3 x 4 mm identified at the deep margin of the endometrial complex and myometrium, likely representing a tiny cyst in this patient with a negative pregnancy test. No additional uterine masses.  Endometrium  Thickness: 9 mm thick, normal.  No endometrial fluid.  Right ovary  Surgically absent.  Left ovary  Measurements: 2.9 x 1.5 x 3.1 cm. Normal morphology without mass.  Other findings  Moderate simple free pelvic fluid.  No adnexal masses.  IMPRESSION: Post RIGHT salpingo-oophorectomy.  Unremarkable LEFT ovary and endometrial complex.  Tiny 3 x 3 x 4 mm diameter cyst within uterus, patient with a quantitative beta HCG < 1.   Electronically Signed   By: Lavonia Dana M.D.   On: 02/14/2014 16:41   US Pelvis Complete  02/14/2014   CLINICAL DATA:  Pregnant, post ventricular fibrillation and arrest, reported  recent miscarriage, past history of RIGHT salpingo-oophorectomy ; quantitative beta HCG < 1  EXAM: TRANSABDOMINAL AND TRANSVAGINAL ULTRASOUND OF PELVIS  TECHNIQUE: Both transabdominal and transvaginal ultrasound examinations of the pelvis were performed. Transabdominal technique was performed for global imaging of the pelvis including uterus, ovaries, adnexal regions, and pelvic cul-de-sac. It was necessary to proceed with endovaginal exam following the transabdominal exam to visualize the LEFT ovary and RIGHT adnexa.  COMPARISON:  12/22/2013  FINDINGS: Uterus  Measurements: 9.2 x 4.1 x 5.2 cm. Normal myometrial echogenicity. Small cystic structure 3 x 3 x 4 mm identified at the deep margin of the endometrial complex and myometrium, likely representing a tiny cyst in this patient with a negative pregnancy test. No additional uterine masses.  Endometrium  Thickness: 9 mm thick, normal.  No endometrial fluid.  Right ovary  Surgically absent.  Left ovary  Measurements: 2.9 x 1.5 x 3.1 cm. Normal morphology without mass.  Other findings  Moderate simple free pelvic fluid.  No adnexal masses.  IMPRESSION: Post RIGHT salpingo-oophorectomy.  Unremarkable LEFT ovary and endometrial complex.  Tiny 3 x 3 x 4 mm diameter cyst within uterus, patient with a quantitative beta HCG < 1.   Electronically Signed   By: Lavonia Dana M.D.   On: 02/14/2014 16:41   Dg Chest Port 1 View  02/16/2014   CLINICAL DATA:  Endotracheal tube placement.  EXAM: PORTABLE CHEST - 1 VIEW  COMPARISON:  February 15, 2014.  FINDINGS: Cardiomediastinal silhouette appears normal. Endotracheal tube is in grossly good position with distal tip 4 cm above the carina. Nasogastric tube is seen entering the stomach. No pneumothorax or significant pleural effusion is noted. Increased right basilar opacity is noted concerning for edema or pneumonia. Right internal jugular catheter line is noted with distal tip in expected position of the cavoatrial junction. Bony  thorax is intact.  IMPRESSION: Endotracheal tube in grossly good position. Increased right basilar opacity is noted medially concerning for worsening edema or pneumonia.   Electronically Signed   By: Sabino Dick M.D.   On: 02/16/2014 07:38   Dg Chest Port 1 View  02/15/2014   CLINICAL DATA:  Assess endotracheal tube positioning  EXAM: PORTABLE CHEST - 1 VIEW  COMPARISON:  Portable chest x-ray of February 14, 2014  FINDINGS: The lungs are mildly hyperinflated and clear. The heart and mediastinal structures are normal. There is no pleural effusion or pneumothorax. The endotracheal tube tip lies 3.2 cm above the crotch of the carina. The esophagogastric tube tip projects off the inferior margin of the image. The right internal jugular venous catheter tip lies in the midportion of the SVC. External pacemaker pads are present.  IMPRESSION: The support tubes and lines are in appropriate position. No acute cardiopulmonary abnormality is demonstrated.   Electronically Signed   By: David  [place]   On: 02/15/2014 07:16    Medications:  I have reviewed the patient's current medications. Scheduled: . antiseptic oral rinse  7 mL Mouth Rinse QID  . chlorhexidine  15 mL Mouth Rinse BID  . feeding supplement (VITAL HIGH PROTEIN)  1,000 mL Per Tube Q24H  . folic acid  1 mg Intravenous Daily  . insulin aspart  2-6 Units Subcutaneous 6 times per day  . insulin glargine  10 Units Subcutaneous Q24H  . levETIRAcetam  1,000 mg Intravenous BID  . magnesium sulfate 1 - 4 g bolus IVPB  2 g Intravenous Once  . pantoprazole (PROTONIX) IV  40 mg Intravenous QHS  . potassium chloride  10 mEq Intravenous Q1 Hr x 4  . thiamine  100 mg Intravenous Daily  Assessment/Plan: Patient improved.  Awake and alert.  No seizure activity noted.  Patient remains on Keppra.  Unclear if arrhythmia related to seizure but arrhythmias noted during withdrawal of sedation were not associated with seizure activity on monitoring so this is less  likely.    Recommendations: 1.  Continue Keppra at current dose 2.  D/C LTM   LOS: 3 days   Alexis Goodell, MD [facility] [phone] 02/16/2014  10:54 AM

## 2014-02-16 NOTE — Procedures (Signed)
Indication:  This is a 38 yo female with unresponsiveness after cardiac arrest.  She had some myoclonus witnessed and VEEG study is to rule out any clinical or subclinical seizure.  The patient was sedated on mechanical ventilator support.  This is day three of continuous VEEG study.  Medications include: Ativan, Fentanyl, Nimbex, Versed, Levophed, Magnesium sulfate, KCL, and Keppra per record.  Technique and Protocol: This is a standard 18-channel VEEG study utilizing international 10/20 system with both bipolar and monopolar montages.  Video source and pushbutton were available.  The patient was intubated on mechanic ventilator support. The study was performed at [facility].  Description of Findings: The VEEG study was started at 7:30 am on  07/30 and ended at 7:27 am on 07/31.  Total 24-hour EEG study was scanned in its entity.  Video source was reviewed.  The baseline EEG background  Reaches  4-5 Hz with spontaneous EEG background reactivity.  The amplitude of EEG background reaches30-40 uV. The patient had occasional spontaneous limb and trunk movements per reviewing video, corresponding to the noted movements artifacts on EEG tracing.  There are intermittent, low-voltage, arrhythmic delta activities noted during VEEG study.  No clinical or subclinical seizures identified.  No generalized or focal spikes/sharp waves.  Impression: This is an abnormal VEEG monitoring study.  It shows the presence of a nonspecific cerebral dysfunction.  The VEEG findings are consistent with a moderate metabolic/toxic/anoxic encephalopathy.  Clinical correlation is strongly advised.  There is some improvement in EEG background compared to the study done one day prior.  Clinical correlation is advised. There are no clinical or subclinical seizures captured.  Recommend weaning off sedation if continual VEEG study is indicated.

## 2014-02-16 NOTE — Progress Notes (Signed)
SLP Cancellation Note  Patient Details Name: Kristen Roberson MRN: [id number] DOB: 11-08-1975   Cancelled treatment:        Orders received for BSE. Pt extubated @ 1000 today (02/16/14). Will hold eval at this time to insure pt tolerates being off vent. Will continue efforts next date.  Celia B. Clio, [facility], CCC-SLP [phone]  Shonna Chock 02/16/2014, 11:15 AM

## 2014-02-16 NOTE — Progress Notes (Signed)
PT Cancellation Note  Patient Details Name: Kristen Roberson MRN: [id number] DOB: 1976-01-04   Cancelled Treatment:    Reason Eval/Treat Not Completed: Medical issues which prohibited therapy (pt just extubated and per dept will need to be off vent 4 hrs prior to eval. Will attempt later as time allows or next date)   Melford Aase 02/16/2014, 11:05 AM Elwyn Reach, [place]

## 2014-02-16 NOTE — Progress Notes (Addendum)
40 mL of Versed & 150 mL of Fentanyl wasted in sink witnessed by [place] Nat Lowenthal, RN. Abram, Ardeth Sportsman

## 2014-02-16 NOTE — Progress Notes (Addendum)
Hypoglycemic Event  CBG: 66  Treatment: D50 IV 25 mL  Symptoms: None  Follow-up CBG: Time:23:50 CBG Result:95  Possible Reasons for Event: Unknown      Kristen Roberson, [name]  Remember to initiate Hypoglycemia Order Set & complete

## 2014-02-16 NOTE — Procedures (Signed)
Extubation Procedure Note  Patient Details:   Name: [name] DOB: [birth year] MRN: [id number]   Airway Documentation:  Airway 8 mm (Active)  Secured at (cm) 23 cm 02/16/2014  7:59 AM  Measured From Lips 02/16/2014  7:59 AM  Secured Location Left 02/16/2014  7:59 AM  Secured By [employer] 02/16/2014  7:59 AM  Tube Holder Repositioned Yes 02/16/2014  7:59 AM  Cuff Pressure (cm H2O) 22 cm H2O 02/16/2014  7:59 AM  Site Condition Dry 02/16/2014  7:59 AM   Pt extubated, suctioned prior to extubattion, incentive spirometer given, patient achieved 750-1000cc's x5 followed by weak nonproductive cough. Evaluation  O2 sats: stable throughout Complications: No apparent complications Patient did tolerate procedure well. Bilateral Breath Sounds: Clear;Diminished   Yes  Kristen Roberson 02/16/2014, 10:05 AM

## 2014-02-16 NOTE — Progress Notes (Signed)
PULMONARY / CRITICAL CARE MEDICINE   Name: Kristen Roberson MRN: [id number] DOB: 1976-02-19    ADMISSION DATE:  02/13/2014 CONSULTATION DATE:  02/16/2014  REFERRING MD :  EDP  CHIEF COMPLAINT:  Status epilepticus complicated by V.fib arrest  INITIAL PRESENTATION: 38 y.o. F with hx of alcohol abuse brought to ED on 7/27 likely EtOH withdrawal seizure followed shortly thereafter by V-fib arrest.  Intubated, arctic sun protocol.  Found to be profoundly hypokalemic, magnesemic, hypocalcemic and echo showed LVEF 20-25% with apical akinesis.  Was interactive after rewarming.  STUDIES:  7/27 CTA head >>> NAICP 7/27 Continuous EEG >>> 7/29 TTE > LVEF 20-25%, severe hypokinesis anteroseptal myocardium, mild MR 7/29 transvaginal ultrasound > small intrauterine cyst, s/p R salpingo-oophrectomy, L ovary intact   SIGNIFICANT EVENTS: 7/27 witnessed seizure by pt's fiance while watching TV at home, led to v.fib arrest in the field. 7/27 intubated, hypothermia protocol initiated. 7/30 appropriate and interactive after rewarming, had 8-9 second run of VTach  SUBJECTIVE:  After rewarming she had a 8 second run of VTach, interacted appropriately  VITAL SIGNS: Temp:  [93.9 F (34.4 C)-99.5 F (37.5 C)] 99.1 F (37.3 C) (07/31 0400) Pulse Rate:  [78-141] 99 (07/31 0500) Resp:  [9-32] 32 (07/31 0500) BP: (87-122)/(48-89) 120/75 mmHg (07/31 0500) SpO2:  [98 %-100 %] 99 % (07/31 0500) Arterial Line BP: (86-140)/(50-95) 121/63 mmHg (07/31 0500) FiO2 (%):  [30 %] 30 % (07/31 0413) Weight:  [60.9 kg (134 lb 4.2 oz)] 60.9 kg (134 lb 4.2 oz) (07/31 0400) HEMODYNAMICS: CVP:  [2 mmHg-3 mmHg] 3 mmHg VENTILATOR SETTINGS: Vent Mode:  [-] PRVC FiO2 (%):  [30 %] 30 % Set Rate:  [14 bmp-20 bmp] 14 bmp Vt Set:  [450 mL] 450 mL PEEP:  [5 cmH20] 5 cmH20 Plateau Pressure:  [9 cmH20-16 cmH20] 9 cmH20 INTAKE / OUTPUT: Intake/Output     07/30 0701 - 07/31 0700   I.V. (mL/kg) 1336.3 (21.9)   NG/GT 230   IV  Piggyback 660   Total Intake(mL/kg) 2226.3 (36.6)   Urine (mL/kg/hr) 1040 (0.7)   Total Output 1040   Net +1186.3         PHYSICAL EXAMINATION:  Gen: sedated but arouses on vent HEENT: NCAT, ETT in place PULM: CTA B CV: RRR, S3 noted AB: limited by pads Ext: warm, no edema, no clubbing, no cyanosis Derm: no obvious bruising Neuro: sedated on vent, arouses to interact, follow comands   LABS:  CBC  Recent Labs Lab 02/13/14 2253  02/14/14 0435 02/14/14 0455 02/14/14 0927 02/15/14 0445  WBC 7.6  --   --  6.0  --  3.8*  HGB 9.9*  < > 11.2* 9.8*  --  9.7*  HCT 31.1*  < > 33.0* 30.2*  --  29.6*  PLT 74*  --   --  66* 91* 51*  < > = values in this interval not displayed. Coag's  Recent Labs Lab 02/14/14 0140 02/14/14 0920 02/14/14 0927  APTT 26 33 31  INR 1.14 1.16 1.21   BMET  Recent Labs Lab 02/15/14 0805 02/15/14 1728 02/15/14 2335  NA 142 140 139  K 3.2* 4.5 3.7  CL 108 106 109  CO2 _0 BUN 3* 3* <3*  CREATININE 0.31* 0.42* 0.37*  GLUCOSE 235* 104* 171*   Electrolytes  Recent Labs Lab 02/13/14 2253  02/15/14 0805 02/15/14 1728 02/15/14 2335  CALCIUM 7.2*  < > 6.4* 7.2* 7.0*  MG 2.4  < > 1.6 1.9  1.6  PHOS 2.7  --   --   --   --   < > = values in this interval not displayed. Sepsis Markers  Recent Labs Lab 02/14/14 0311 02/14/14 0920 02/14/14 2212  LATICACIDVEN 8.6* 4.7* 0.9   ABG  Recent Labs Lab 02/13/14 2321 02/14/14 0257 02/15/14 0317  PHART 7.381 7.296* 7.490*  PCO2ART 33.5* 33.9* 24.1*  PO2ART 86.0 130.0* 137.0*   Liver Enzymes  Recent Labs Lab 02/13/14 2050 02/15/14 0805  AST 384* 64*  ALT 50* 17  ALKPHOS 171* 96  BILITOT 3.4* 0.9  ALBUMIN 4.2 2.4*   Cardiac Enzymes No results found for this basename: TROPONINI, PROBNP,  in the last 168 hours Glucose  Recent Labs Lab 02/15/14 1348 02/15/14 1610 02/15/14 1929 02/15/14 2330 02/16/14 0006 02/16/14 0326  GLUCAP 87 104* 79 66* 95 103*     Imaging US Transvaginal Non-ob  02/14/2014   CLINICAL DATA:  Pregnant, post ventricular fibrillation and arrest, reported recent miscarriage, past history of RIGHT salpingo-oophorectomy ; quantitative beta HCG < 1  EXAM: TRANSABDOMINAL AND TRANSVAGINAL ULTRASOUND OF PELVIS  TECHNIQUE: Both transabdominal and transvaginal ultrasound examinations of the pelvis were performed. Transabdominal technique was performed for global imaging of the pelvis including uterus, ovaries, adnexal regions, and pelvic cul-de-sac. It was necessary to proceed with endovaginal exam following the transabdominal exam to visualize the LEFT ovary and RIGHT adnexa.  COMPARISON:  12/22/2013  FINDINGS: Uterus  Measurements: 9.2 x 4.1 x 5.2 cm. Normal myometrial echogenicity. Small cystic structure 3 x 3 x 4 mm identified at the deep margin of the endometrial complex and myometrium, likely representing a tiny cyst in this patient with a negative pregnancy test. No additional uterine masses.  Endometrium  Thickness: 9 mm thick, normal.  No endometrial fluid.  Right ovary  Surgically absent.  Left ovary  Measurements: 2.9 x 1.5 x 3.1 cm. Normal morphology without mass.  Other findings  Moderate simple free pelvic fluid.  No adnexal masses.  IMPRESSION: Post RIGHT salpingo-oophorectomy.  Unremarkable LEFT ovary and endometrial complex.  Tiny 3 x 3 x 4 mm diameter cyst within uterus, patient with a quantitative beta HCG < 1.   Electronically Signed   By: Lavonia Dana M.D.   On: 02/14/2014 16:41   US Pelvis Complete  02/14/2014   CLINICAL DATA:  Pregnant, post ventricular fibrillation and arrest, reported recent miscarriage, past history of RIGHT salpingo-oophorectomy ; quantitative beta HCG < 1  EXAM: TRANSABDOMINAL AND TRANSVAGINAL ULTRASOUND OF PELVIS  TECHNIQUE: Both transabdominal and transvaginal ultrasound examinations of the pelvis were performed. Transabdominal technique was performed for global imaging of the pelvis including  uterus, ovaries, adnexal regions, and pelvic cul-de-sac. It was necessary to proceed with endovaginal exam following the transabdominal exam to visualize the LEFT ovary and RIGHT adnexa.  COMPARISON:  12/22/2013  FINDINGS: Uterus  Measurements: 9.2 x 4.1 x 5.2 cm. Normal myometrial echogenicity. Small cystic structure 3 x 3 x 4 mm identified at the deep margin of the endometrial complex and myometrium, likely representing a tiny cyst in this patient with a negative pregnancy test. No additional uterine masses.  Endometrium  Thickness: 9 mm thick, normal.  No endometrial fluid.  Right ovary  Surgically absent.  Left ovary  Measurements: 2.9 x 1.5 x 3.1 cm. Normal morphology without mass.  Other findings  Moderate simple free pelvic fluid.  No adnexal masses.  IMPRESSION: Post RIGHT salpingo-oophorectomy.  Unremarkable LEFT ovary and endometrial complex.  Tiny 3 x 3 x  4 mm diameter cyst within uterus, patient with a quantitative beta HCG < 1.   Electronically Signed   By: Lavonia Dana M.D.   On: 02/14/2014 16:41   Dg Chest Port 1 View  02/15/2014   CLINICAL DATA:  Assess endotracheal tube positioning  EXAM: PORTABLE CHEST - 1 VIEW  COMPARISON:  Portable chest x-ray of February 14, 2014  FINDINGS: The lungs are mildly hyperinflated and clear. The heart and mediastinal structures are normal. There is no pleural effusion or pneumothorax. The endotracheal tube tip lies 3.2 cm above the crotch of the carina. The esophagogastric tube tip projects off the inferior margin of the image. The right internal jugular venous catheter tip lies in the midportion of the SVC. External pacemaker pads are present.  IMPRESSION: The support tubes and lines are in appropriate position. No acute cardiopulmonary abnormality is demonstrated.   Electronically Signed   By: David  [place]   On: 02/15/2014 07:16     ASSESSMENT / PLAN:  PULMONARY OETT 7/28 > A: Acute respiratory failure due to VFib arrest P:   SBT/WUA today VAP  bundle. ABG and CXR daily  CARDIOVASCULAR R IJ CVL 7/28 > R Radial aline 7/28 > A:  V.Fib arrest -due to hypomag/K and cardiomyopathy Shock > cardiogenic Cardiomyopathy > ischemic vs dilated vs shock from arrest/takotsubo VTach P:  Goal MAP > 65  Trend troponin / lactate per cardiology  RENAL A:   Hypokalemia Hypomagnesemia Hypocalcemia > presumably related to EtOH abuse Lactic acidosis > resolved P:   KVO IVF Keep K > 4, Mg > 2  BMET/Mg q8h Monitor calcium and replete as needed  GASTROINTESTINAL A:   GI prophylaxis P:   Hold TF for likely extubation Pantoprazole for stress ulcer prophylaxis  HEMATOLOGIC A:   VTE Prophylaxis Anemia - chronic. Thrombocytopenia > chronic, presumably related to EtOH P:  SCD's  Transfuse for Hgb < 7. CBC q shift.  INFECTIOUS A:   No evidence of infection P:   Monitor clinically.  ENDOCRINE A:   Hyperglycemia   P:   ICU hyperglycemia protocol.  NEUROLOGIC A:   Acute encephalopathy > improving ETOH abuse EtOH withdrawal seizure? > EEG in ICU without seizure activity P:   WUA today Thiamine / Folate. Continue home Keppra Neuro following, appreciate their assistance  OB/GYN:  A: Chronic vaginal bleeding P: Monitor Progesterone if worse   TODAY'S SUMMARY: 38 y/o female s/p VFib arrest after likely EtOH withdrawal seizure in setting of hypokalemia, hypomagnesemia and hypocalcemia, now with depressed EF on echo. WUA/SBT today  Family: I met with boyfriend yesterday, have tried to contact her husband; siblings, mother live in [place]  I have personally obtained a history, examined the patient, evaluated laboratory and imaging results, formulated the assessment and plan and placed orders. CRITICAL CARE: The patient is critically ill with multiple organ systems failure and requires high complexity decision making for assessment and support, frequent evaluation and titration of therapies, application of advanced  monitoring technologies and extensive interpretation of multiple databases. Critical Care Time devoted to patient care services described in this note is 38 minutes.   Roselie Awkward, MD [facility] Pager: [phone] Cell: [phone] If no response, call [phone]

## 2014-02-16 NOTE — Progress Notes (Addendum)
Patient ID: Kristen Roberson, female   DOB: Nov 04, 1975, 38 y.o.   MRN: [id number]   SUBJECTIVE: 38 y.o. F with hx of alcohol abuse brought to ED on 7/27 likely EtOH withdrawal seizure followed shortly thereafter by V-fib arrest. Intubated, arctic sun protocol. Found to be profoundly hypokalemic, magnesemic, hypocalcemic and echo showed LVEF 20-25% with apical akinesis.    Extubated. Following commands this am now sedated. Remains on levophed 4.  EEG with a moderate metabolic/toxic/anoxic encephalopathy   Scheduled Meds: . antiseptic oral rinse  7 mL Mouth Rinse QID  . chlorhexidine  15 mL Mouth Rinse BID  . folic acid  1 mg Intravenous Daily  . insulin aspart  2-6 Units Subcutaneous 6 times per day  . insulin glargine  10 Units Subcutaneous Q24H  . levETIRAcetam  1,000 mg Intravenous BID  . [COMPLETED] LORazepam      . pantoprazole (PROTONIX) IV  40 mg Intravenous QHS  . potassium chloride  10 mEq Intravenous Q1 Hr x 4  . thiamine  100 mg Intravenous Daily   Continuous Infusions: . dexmedetomidine 0.7 mcg/kg/hr (02/16/14 1347)  . fentaNYL infusion INTRAVENOUS Stopped (02/16/14 0835)  . midazolam (VERSED) infusion Stopped (02/15/14 1300)  . norepinephrine (LEVOPHED) Adult infusion 4 mcg/min (02/16/14 1345)  . propofol Stopped (02/16/14 0836)   PRN Meds:.sodium chloride, fentaNYL, HYDROmorphone (DILAUDID) injection, LORazepam    Filed Vitals:   02/16/14 1200 02/16/14 1230 02/16/14 1240 02/16/14 1300  BP: 101/65 104/60 100/56 99/55  Pulse: 109 95 95 95  Temp:      TempSrc:      Resp: 16 14 12 14   Height:      Weight:      SpO2: 96% 97% 94% 95%    Intake/Output Summary (Last 24 hours) at 02/16/14 1356 Last data filed at 02/16/14 1328  Gross per 24 hour  Intake 2278.03 ml  Output   1215 ml  Net 1063.03 ml    LABS: Basic Metabolic Panel:  Recent Labs  02/13/14 2253  02/16/14 0800 02/16/14 1200  NA 141  < > 140 139  K 3.1*  < > 3.7 3.9  CL 96  < > 103 106  CO2 18*  <  > 24 23  GLUCOSE 272*  < > 99 103*  BUN 5*  < > <3* <3*  CREATININE 0.47*  0.46*  < > 0.41* 0.37*  CALCIUM 7.2*  < > 7.4* 7.3*  MG 2.4  < > 1.4* 1.3*  PHOS 2.7  --   --   --   < > = values in this interval not displayed. Liver Function Tests:  Recent Labs  02/13/14 2050 02/15/14 0805  AST 384* 64*  ALT 50* 17  ALKPHOS 171* 96  BILITOT 3.4* 0.9  PROT 7.1 4.8*  ALBUMIN 4.2 2.4*    Recent Labs  02/13/14 2050  LIPASE 42   CBC:  Recent Labs  02/15/14 0445 02/16/14 0525  WBC 3.8* 3.6*  NEUTROABS 3.1 2.9  HGB 9.7* 8.1*  HCT 29.6* 25.7*  MCV 90.0 93.5  PLT 51* 50*   Cardiac Enzymes: No results found for this basename: CKTOTAL, CKMB, CKMBINDEX, TROPONINI,  in the last 72 hours BNP: No components found with this basename: POCBNP,  D-Dimer:  Recent Labs  02/14/14 0927  DDIMER 0.83*   Hemoglobin A1C: No results found for this basename: HGBA1C,  in the last 72 hours Fasting Lipid Panel: No results found for this basename: CHOL, HDL, LDLCALC, TRIG, CHOLHDL, LDLDIRECT,  in the last 72 hours Thyroid Function Tests: No results found for this basename: TSH, T4TOTAL, FREET3, T3FREE, THYROIDAB,  in the last 72 hours Anemia Panel: No results found for this basename: VITAMINB12, FOLATE, FERRITIN, TIBC, IRON, RETICCTPCT,  in the last 72 hours  RADIOLOGY: Ct Angio Head W/cm &/or Wo Cm  02/13/2014   CLINICAL DATA:  Unresponsive.  EXAM: CT ANGIOGRAPHY HEAD  TECHNIQUE: Multidetector CT imaging of the head was performed using the standard protocol during bolus administration of intravenous contrast. Multiplanar CT image reconstructions and MIPs were obtained to evaluate the vascular anatomy.  CONTRAST:  65mL OMNIPAQUE IOHEXOL 350 MG/ML SOLN  COMPARISON:  None.  FINDINGS: CT head: The ventricles and sulci are normal. No intraparenchymal hemorrhage, mass effect nor midline shift. No acute large vascular territory infarcts. Postcontrast imaging demonstrates no abnormal parenchymal nor  leptomeningeal enhancement.  No abnormal extra-axial fluid collections. Basal cisterns are patent.  No skull fracture. The included ocular globes and orbital contents are non-suspicious. The mastoid aircells and included paranasal sinuses are well-aerated. Life support lines in place, seen on the localizer.  CTA head: Anterior circulation: Normal appearance of the cervical internal carotid arteries, petrous, cavernous and supra clinoid internal carotid arteries. Widely patent anterior communicating artery. Normal appearance of the anterior and middle cerebral arteries.  Posterior circulation: Left vertebral artery is dominant, with normal appearance of the vertebral arteries, vertebrobasilar junction and basilar artery, as well as main branch vessels. Small bilateral posterior communicating arteries are present. Normal appearance of the posterior cerebral arteries.  No large vessel occlusion, hemodynamically significant stenosis, dissection, luminal irregularity, contrast extravasation or aneurysm within the anterior nor posterior circulation.  Review of the MIP images confirms the above findings.  IMPRESSION: CT head: No acute intracranial process, normal examination. No abnormal enhancement.  CTA head:  No acute vascular process, normal examination.   Electronically Signed   By: Elon Alas   On: 02/13/2014 22:28   Dg Ankle Complete Left  02/08/2014   CLINICAL DATA:  Left ankle pain and discoloration for 1 day.  EXAM: LEFT ANKLE COMPLETE - 3+ VIEW  COMPARISON:  None.  FINDINGS: No acute bony or joint abnormality is identified. Small osteophyte off the dorsal aspect of the proximal navicular bone is noted. Soft tissue structures appear normal.  IMPRESSION: Negative exam.   Electronically Signed   By: Inge Rise M.D.   On: 02/08/2014 16:20   US Transvaginal Non-ob  02/14/2014   CLINICAL DATA:  Pregnant, post ventricular fibrillation and arrest, reported recent miscarriage, past history of RIGHT  salpingo-oophorectomy ; quantitative beta HCG < 1  EXAM: TRANSABDOMINAL AND TRANSVAGINAL ULTRASOUND OF PELVIS  TECHNIQUE: Both transabdominal and transvaginal ultrasound examinations of the pelvis were performed. Transabdominal technique was performed for global imaging of the pelvis including uterus, ovaries, adnexal regions, and pelvic cul-de-sac. It was necessary to proceed with endovaginal exam following the transabdominal exam to visualize the LEFT ovary and RIGHT adnexa.  COMPARISON:  12/22/2013  FINDINGS: Uterus  Measurements: 9.2 x 4.1 x 5.2 cm. Normal myometrial echogenicity. Small cystic structure 3 x 3 x 4 mm identified at the deep margin of the endometrial complex and myometrium, likely representing a tiny cyst in this patient with a negative pregnancy test. No additional uterine masses.  Endometrium  Thickness: 9 mm thick, normal.  No endometrial fluid.  Right ovary  Surgically absent.  Left ovary  Measurements: 2.9 x 1.5 x 3.1 cm. Normal morphology without mass.  Other findings  Moderate simple free  pelvic fluid.  No adnexal masses.  IMPRESSION: Post RIGHT salpingo-oophorectomy.  Unremarkable LEFT ovary and endometrial complex.  Tiny 3 x 3 x 4 mm diameter cyst within uterus, patient with a quantitative beta HCG < 1.   Electronically Signed   By: Lavonia Dana M.D.   On: 02/14/2014 16:41   US Pelvis Complete  02/14/2014   CLINICAL DATA:  Pregnant, post ventricular fibrillation and arrest, reported recent miscarriage, past history of RIGHT salpingo-oophorectomy ; quantitative beta HCG < 1  EXAM: TRANSABDOMINAL AND TRANSVAGINAL ULTRASOUND OF PELVIS  TECHNIQUE: Both transabdominal and transvaginal ultrasound examinations of the pelvis were performed. Transabdominal technique was performed for global imaging of the pelvis including uterus, ovaries, adnexal regions, and pelvic cul-de-sac. It was necessary to proceed with endovaginal exam following the transabdominal exam to visualize the LEFT ovary and  RIGHT adnexa.  COMPARISON:  12/22/2013  FINDINGS: Uterus  Measurements: 9.2 x 4.1 x 5.2 cm. Normal myometrial echogenicity. Small cystic structure 3 x 3 x 4 mm identified at the deep margin of the endometrial complex and myometrium, likely representing a tiny cyst in this patient with a negative pregnancy test. No additional uterine masses.  Endometrium  Thickness: 9 mm thick, normal.  No endometrial fluid.  Right ovary  Surgically absent.  Left ovary  Measurements: 2.9 x 1.5 x 3.1 cm. Normal morphology without mass.  Other findings  Moderate simple free pelvic fluid.  No adnexal masses.  IMPRESSION: Post RIGHT salpingo-oophorectomy.  Unremarkable LEFT ovary and endometrial complex.  Tiny 3 x 3 x 4 mm diameter cyst within uterus, patient with a quantitative beta HCG < 1.   Electronically Signed   By: Lavonia Dana M.D.   On: 02/14/2014 16:41   Dg Chest Port 1 View  02/15/2014   CLINICAL DATA:  Assess endotracheal tube positioning  EXAM: PORTABLE CHEST - 1 VIEW  COMPARISON:  Portable chest x-ray of February 14, 2014  FINDINGS: The lungs are mildly hyperinflated and clear. The heart and mediastinal structures are normal. There is no pleural effusion or pneumothorax. The endotracheal tube tip lies 3.2 cm above the crotch of the carina. The esophagogastric tube tip projects off the inferior margin of the image. The right internal jugular venous catheter tip lies in the midportion of the SVC. External pacemaker pads are present.  IMPRESSION: The support tubes and lines are in appropriate position. No acute cardiopulmonary abnormality is demonstrated.   Electronically Signed   By: David  Martinique   On: 02/15/2014 07:16   Dg Chest Port 1 View  02/14/2014   CLINICAL DATA:  Central line placement.  EXAM: PORTABLE CHEST - 1 VIEW  COMPARISON:  02/13/2014  FINDINGS: Interval placement of a right central venous catheter with tip over the cavoatrial junction. No pneumothorax. Endotracheal tube appears to the advanced slightly with  tip now the origin of the right mainstem bronchus. New placement of an enteric tube. The tip is off the field of view but below the left hemidiaphragm. Normal heart size and pulmonary vascularity. Lungs are clear and expanded.  IMPRESSION: Right central venous catheter tip localizes over the cavoatrial junction. No pneumothorax. Endotracheal tube tip now appears to reside in the origin of the right mainstem bronchus.  These results were called by telephone at the time of interpretation on 02/14/2014 at 1:12 am to the patient's nurse, Lamar, who verbally acknowledged these results.   Electronically Signed   By: Lucienne Capers M.D.   On: 02/14/2014 01:14   Dg Chest [facility]  1 View  02/13/2014   CLINICAL DATA:  Post CPR  EXAM: PORTABLE CHEST - 1 VIEW  COMPARISON:  Prior radiograph from 12/01/2013  FINDINGS: Defibrillator pads overlie the thorax. Patient is intubated with the tip of the endotracheal tube approximately 1 cm above the carina. Mediastinal silhouette within normal limits.  The lungs are normally inflated. No airspace consolidation, pleural effusion, or pulmonary edema is identified. There is no pneumothorax.  No acute osseous abnormality identified.  IMPRESSION: 1. Tip of endotracheal tube somewhat low lying just above the carina. Retraction by 2 cm could be considered to avoid slippage into the mainstem bronchi. 2. No acute cardiopulmonary abnormality identified. Results were called by telephone at the time of interpretation on 02/13/2014 at 9:13 pm to Dr. Claudean Severance , who verbally acknowledged these results.   Electronically Signed   By: Jeannine Boga M.D.   On: 02/13/2014 21:13   Dg Hand Complete Left  01/29/2014   CLINICAL DATA:  Traumatic injury and pain  EXAM: LEFT HAND - COMPLETE 3+ VIEW  COMPARISON:  None.  FINDINGS: Mild degenerate changes are noted at the first metacarpophalangeal joint. No acute fracture or dislocation is seen. No gross soft tissue abnormality is noted.  IMPRESSION:  Degenerative change without acute abnormality.   Electronically Signed   By: Inez Catalina M.D.   On: 01/29/2014 12:39    PHYSICAL EXAM General: Sedated but arousable and follows commands Neck: No JVD, no thyromegaly or thyroid nodule.  Lungs: Clear to auscultation bilaterally with normal respiratory effort. CV: Nondisplaced PMI.  Heart regular S1/S2, no S3/S4, no murmur.  No peripheral edema.   Abdomen: Soft, nontender, no hepatosplenomegaly, no distention.  Neurologic:Sedated but arousable and follows commands Extremities: No clubbing or cyanosis.   TELEMETRY: Reviewed telemetry pt in NSR  ASSESSMENT AND PLAN: 38 yo with history of depression, prior electrolyte abnormalities had ventricular fibrillation arrest (cardioverted) and now undergoing hypothermia protocol.  1. Ventricular fibrillation: Patient has had prolonged QT interval over multiple prior ECGs.  She was hypokalemic, hypomagnesemic, and hypocalcemic on admission.  Suspect torsades in the setting of electrolyte abnormalities and also use of trazodone and fluoxetine.  She also is noted to have a cardiomyopathy on her post-arrest echo. EF 20-25% - Replace all electrolytes, stop fluoxetine and trazodone.  Will need to check ECG after electrolyte replacement and rewarming (as hypothermia will prolong QT) to see if QT interval corrects. Repeat ECG in am.  - Would start beta blocker and ACE when off levophed (weaning now) 2. Cardiomyopathy: EF 20-25% with wall motion abnormalities (anteroseptal severe hypokinesis and inferobasal dyskinesis).  LV not dilated and atria normal in size, suggesting relative new onset to the cardiomyopathy.  It is possible this may be stunning or stress cardiomyopathy related to the arrest, but I am concerned with the wall motion abnormalities.  Will need cath on Monday.  If this is normal, would lean towards cardiac MRI to assess for evidence of cardiac sarcoidosis per Dr. Aundra Dubin 3.  Hypokalemia/hypomagnesemia -Mag still low. Given 6gramsmag sulfate 4. ETOH abuse - being treated for ETOH WD. Continue Keppra and benzos as needed.   The patient is critically ill with multiple organ systems failure and requires high complexity decision making for assessment and support, frequent evaluation and titration of therapies, application of advanced monitoring technologies and extensive interpretation of multiple databases.   Critical Care Time devoted to patient care services described in this note is 35 Minutes. Glori Bickers MD 02/16/2014 1:56 PM

## 2014-02-16 NOTE — Progress Notes (Signed)
eLink Physician-Brief Progress Note Patient Name: Kristen Roberson DOB: 1975-08-19 MRN: [id number]  Date of Service  02/16/2014   HPI/Events of Note  NPO post extubation. Borderline hypoglycemia on a couple of occasions. No prior hx of DM   eICU Interventions  DC lantus and SSI. Change CBGs to q 8 hrs and PRN for symptoms of hypoglycemia   Intervention Category Intermediate Interventions: Other:  Merton Border 02/16/2014, 5:00 PM

## 2014-02-17 ENCOUNTER — Inpatient Hospital Stay (HOSPITAL_COMMUNITY): Payer: Self-pay

## 2014-02-17 DIAGNOSIS — F10931 Alcohol use, unspecified with withdrawal delirium: Secondary | ICD-10-CM

## 2014-02-17 DIAGNOSIS — F10231 Alcohol dependence with withdrawal delirium: Secondary | ICD-10-CM

## 2014-02-17 LAB — BASIC METABOLIC PANEL
Anion gap: 13 (ref 5–15)
Anion gap: 13 (ref 5–15)
BUN: 3 mg/dL — ABNORMAL LOW (ref 6–23)
BUN: 3 mg/dL — AB (ref 6–23)
CALCIUM: 7.6 mg/dL — AB (ref 8.4–10.5)
CHLORIDE: 106 meq/L (ref 96–112)
CO2: 22 mEq/L (ref 19–32)
CO2: 23 mEq/L (ref 19–32)
Calcium: 7.3 mg/dL — ABNORMAL LOW (ref 8.4–10.5)
Chloride: 106 mEq/L (ref 96–112)
Creatinine, Ser: 0.33 mg/dL — ABNORMAL LOW (ref 0.50–1.10)
Creatinine, Ser: 0.33 mg/dL — ABNORMAL LOW (ref 0.50–1.10)
GFR calc non Af Amer: >90 mL/min (ref >90)
GLUCOSE: 100 mg/dL — AB (ref 70–99)
Glucose, Bld: 95 mg/dL (ref 70–99)
POTASSIUM: 3.8 meq/L (ref 3.7–5.3)
Potassium: 3.8 mEq/L (ref 3.7–5.3)
SODIUM: 141 meq/L (ref 137–147)
Sodium: 142 mEq/L (ref 137–147)

## 2014-02-17 LAB — CBC
HCT: 22.1 % — ABNORMAL LOW (ref 36.0–46.0)
HEMATOCRIT: 23 % — AB (ref 36.0–46.0)
Hemoglobin: 7.3 g/dL — ABNORMAL LOW (ref 12.0–15.0)
Hemoglobin: 7.1 g/dL — ABNORMAL LOW (ref 12.0–15.0)
MCH: 29.4 pg (ref 26.0–34.0)
MCH: 29.8 pg (ref 26.0–34.0)
MCHC: 32.1 g/dL (ref 30.0–36.0)
MCHC: 31.7 g/dL (ref 30.0–36.0)
MCV: 92.7 fL (ref 78.0–100.0)
MCV: 92.9 fL (ref 78.0–100.0)
PLATELETS: 38 10*3/uL — AB (ref 150–400)
PLATELETS: 39 10*3/uL — AB (ref 150–400)
RBC: 2.48 MIL/uL — ABNORMAL LOW (ref 3.87–5.11)
RBC: 2.38 MIL/uL — ABNORMAL LOW (ref 3.87–5.11)
RDW: 17.8 % — ABNORMAL HIGH (ref 11.5–15.5)
RDW: 17.9 % — AB (ref 11.5–15.5)
WBC: 2.9 10*3/uL — ABNORMAL LOW (ref 4.0–10.5)
WBC: 2.9 10*3/uL — AB (ref 4.0–10.5)

## 2014-02-17 LAB — GLUCOSE, CAPILLARY
GLUCOSE-CAPILLARY: 95 mg/dL (ref 70–99)
Glucose-Capillary: 125 mg/dL — ABNORMAL HIGH (ref 70–99)
Glucose-Capillary: 84 mg/dL (ref 70–99)
Glucose-Capillary: 109 mg/dL — ABNORMAL HIGH (ref 70–99)

## 2014-02-17 LAB — MAGNESIUM: MAGNESIUM: 1.8 mg/dL (ref 1.5–2.5)

## 2014-02-17 MED ORDER — MAGNESIUM OXIDE 400 (241.3 MG) MG PO TABS
400.0000 mg | ORAL_TABLET | Freq: Every day | ORAL | Status: DC
  Filled 2014-02-17 (×2): qty 1

## 2014-02-17 MED ORDER — MAGNESIUM SULFATE IN D5W 10-5 MG/ML-% IV SOLN
1.0000 g | Freq: Once | INTRAVENOUS | Status: AC
  Administered 2014-02-17: 1 g via INTRAVENOUS
  Filled 2014-02-17: qty 100

## 2014-02-17 MED ORDER — CARVEDILOL 3.125 MG PO TABS
3.1250 mg | ORAL_TABLET | Freq: Two times a day (BID) | ORAL | Status: DC
  Administered 2014-02-18 – 2014-02-22 (×7): 3.125 mg via ORAL
  Filled 2014-02-17 (×12): qty 1

## 2014-02-17 MED ORDER — POTASSIUM CHLORIDE CRYS ER 20 MEQ PO TBCR
40.0000 meq | EXTENDED_RELEASE_TABLET | Freq: Every day | ORAL | Status: DC
  Filled 2014-02-17: qty 2

## 2014-02-17 MED ORDER — MAGNESIUM SULFATE 50 % IJ SOLN: 1.0000 g | Freq: Once | INTRAMUSCULAR | Status: DC

## 2014-02-17 NOTE — Progress Notes (Signed)
PT Cancellation Note  Patient Details Name: Kristen Roberson MRN: [id number] DOB: 1976/02/23   Cancelled Treatment:    Reason Eval/Treat Not Completed: Medical issues which prohibited therapy. Pt with staff in room for imaging, and per RN pt not appropriate for PT due to decreased level of arousal from medication. Will continue to follow and evaluate when able.    Jolyn Lent 02/17/2014, 10:17 AM  Jolyn Lent, PT, DPT Acute Rehabilitation Services Pager: [phone]

## 2014-02-17 NOTE — Progress Notes (Signed)
SLP Cancellation Note  Patient Details Name: EMIKO OSORTO MRN: [id number] DOB: [birth year]   Cancelled treatment:       Reason Eval/Treat Not Completed: Fatigue/lethargy limiting ability to participate. Will f/u tomorrow am for attempt at swallow assessment.    Shuree Brossart, Katherene Ponto 02/17/2014, 12:22 PM

## 2014-02-17 NOTE — Progress Notes (Addendum)
PULMONARY / CRITICAL CARE MEDICINE   Name: Kristen Roberson MRN: [id number] DOB: 1976/04/21    ADMISSION DATE:  02/13/2014 CONSULTATION DATE:  02/17/2014  REFERRING MD :  EDP  CHIEF COMPLAINT:  Status epilepticus complicated by V.fib arrest  INITIAL PRESENTATION: 38 y.o. F with hx of alcohol abuse brought to ED on 7/27 likely EtOH withdrawal seizure followed shortly thereafter by V-fib arrest.  Intubated, arctic sun protocol.  Found to be profoundly hypokalemic, magnesemic, hypocalcemic and echo showed LVEF 20-25% with apical akinesis.  Was interactive after rewarming.  STUDIES:  7/27 CTA head >>> NAICP 7/27 Continuous EEG >>> 7/29 TTE > LVEF 20-25%, severe hypokinesis anteroseptal myocardium, mild MR 7/29 transvaginal ultrasound > small intrauterine cyst, s/p R salpingo-oophrectomy, L ovary intact   SIGNIFICANT EVENTS: 7/27 witnessed seizure by pt's fiance while watching TV at home, led to v.fib arrest in the field. 7/27 intubated, hypothermia protocol initiated. 7/30 appropriate and interactive after rewarming, had 8-9 second run of VTach  SUBJECTIVE:  Fluctuating mental status Afebrile No obvious pain  VITAL SIGNS: Temp:  [98.1 F (36.7 C)-99.3 F (37.4 C)] 98.6 F (37 C) (08/01 0800) Pulse Rate:  [88-122] 92 (08/01 0900) Resp:  [11-25] 20 (08/01 0900) BP: (59-122)/(30-91) 110/68 mmHg (08/01 0800) SpO2:  [94 %-100 %] 98 % (08/01 0900) Arterial Line BP: (59-130)/(30-75) 109/68 mmHg (08/01 0900) Weight:  [128 lb 12 oz (58.4 kg)] 128 lb 12 oz (58.4 kg) (08/01 0500) HEMODYNAMICS: CVP:  [3 mmHg-4 mmHg] 3 mmHg VENTILATOR SETTINGS:   INTAKE / OUTPUT: Intake/Output     07/31 0701 - 08/01 0700 08/01 0701 - 08/02 0700   I.V. (mL/kg) 585.1 (10) 35.2 (0.6)   NG/GT     IV Piggyback 750    Total Intake(mL/kg) 1335.1 (22.9) 35.2 (0.6)   Urine (mL/kg/hr) 2185 (1.6)    Total Output 2185     Net -849.9 +35.2          PHYSICAL EXAMINATION:  Gen: on precedex somnolent HEENT: No  LAN/JVD PULM: CTA B CV: RRR AB: soft nt +bs Ext: warm, no edema, no clubbing, no cyanosis Derm: no obvious bruising Neuro: RASS-3 to +2, follows commands when awake   LABS:  CBC  Recent Labs Lab 02/16/14 0525 02/17/14 0550 02/17/14 0620  WBC 3.6* 2.9* 2.9*  HGB 8.1* 7.3* 7.1*  HCT 25.7* 23.0* 22.1*  PLT 50* 39* 38*   Coag's  Recent Labs Lab 02/14/14 0140 02/14/14 0920 02/14/14 0927  APTT 26 33 31  INR 1.14 1.16 1.21   BMET  Recent Labs Lab 02/16/14 2000 02/17/14 0550 02/17/14 0620  NA 141 141 142  K 3.5* 3.8 3.8  CL 107 106 106  CO2 24 22 23   BUN <3* 3* 3*  CREATININE 0.37* 0.33* 0.33*  GLUCOSE 110* 95 100*   Electrolytes  Recent Labs Lab 02/13/14 2253  02/16/14 1500 02/16/14 2000 02/17/14 0550 02/17/14 0620  CALCIUM 7.2*  < > 7.4* 7.3* 7.3* 7.6*  MG 2.4  < > 2.2 2.2 1.8  --   PHOS 2.7  --   --   --   --   --   < > = values in this interval not displayed. Sepsis Markers  Recent Labs Lab 02/14/14 0311 02/14/14 0920 02/14/14 2212  LATICACIDVEN 8.6* 4.7* 0.9   ABG  Recent Labs Lab 02/13/14 2321 02/14/14 0257 02/15/14 0317  PHART 7.381 7.296* 7.490*  PCO2ART 33.5* 33.9* 24.1*  PO2ART 86.0 130.0* 137.0*   Liver Enzymes  Recent Labs  Lab 02/13/14 2050 02/15/14 0805  AST 384* 64*  ALT 50* 17  ALKPHOS 171* 96  BILITOT 3.4* 0.9  ALBUMIN 4.2 2.4*   Cardiac Enzymes No results found for this basename: TROPONINI, PROBNP,  in the last 168 hours Glucose  Recent Labs Lab 02/16/14 0326 02/16/14 0749 02/16/14 1133 02/16/14 1643 02/16/14 2349 02/17/14 0737  GLUCAP 103* 89 82 126* 108* 95    Imaging Dg Chest Port 1 View  02/16/2014   CLINICAL DATA:  Endotracheal tube placement.  EXAM: PORTABLE CHEST - 1 VIEW  COMPARISON:  February 15, 2014.  FINDINGS: Cardiomediastinal silhouette appears normal. Endotracheal tube is in grossly good position with distal tip 4 cm above the carina. Nasogastric tube is seen entering the stomach. No  pneumothorax or significant pleural effusion is noted. Increased right basilar opacity is noted concerning for edema or pneumonia. Right internal jugular catheter line is noted with distal tip in expected position of the cavoatrial junction. Bony thorax is intact.  IMPRESSION: Endotracheal tube in grossly good position. Increased right basilar opacity is noted medially concerning for worsening edema or pneumonia.   Electronically Signed   By: Sabino Dick M.D.   On: 02/16/2014 07:38     ASSESSMENT / PLAN:  PULMONARY OETT 7/28 >7/31 A: Acute respiratory failure due to VFib arrest P:   O2 as needed Pulmonary toilet  CARDIOVASCULAR R IJ CVL 7/28 > R Radial aline 7/28 >8/1 A:  V.Fib arrest -due to hypomag/K and cardiomyopathy Shock > cardiogenic, resolved Cardiomyopathy > ischemic vs dilated vs shock from arrest/takotsubo VTach P:  Goal MAP > 65  per cardiology-cath, then EP input  RENAL A:   Hypokalemia Hypomagnesemia Hypocalcemia > presumably related to EtOH abuse Lactic acidosis > resolved P:   KVO IVF Keep K > 4, Mg > 2  BMET/Mg q12h Monitor calcium and replete as needed  GASTROINTESTINAL A:   GI prophylaxis P:   Sips and chips as tolerated Pantoprazole for stress ulcer prophylaxis  HEMATOLOGIC A:   VTE Prophylaxis Anemia - chronic. Thrombocytopenia > chronic, presumably related to EtOH P:  SCD's  Transfuse for Hgb < 7. CBC q shift.  INFECTIOUS A:   No evidence of infection P:   Monitor clinically.  ENDOCRINE A:   Hyperglycemia   P:   ICU hyperglycemia protocol.  NEUROLOGIC A:   Acute encephalopathy > improving ETOH abuse EtOH withdrawal seizure? > EEG in ICU without seizure activity P:   Thiamine / Folate. Continue home Keppra Neuro following Taper precedex to off , use ativan prn  OB/GYN:  A: Chronic vaginal bleeding P: Monitor Progesterone if worse   TODAY'S SUMMARY: 38 y/o Kristen s/p VFib arrest after likely EtOH withdrawal  seizure in setting of hypokalemia, hypomagnesemia and hypocalcemia, now with depressed EF on echo. Extubated 7/31. concern for ongoing withdrawal vs encephalopathy  Richardson Landry Minor ACNP [facility] P[facility] Pager [phone] till 3 pm If no answer page [phone]  Care during the described time interval was provided by me and/or other providers on the critical care team.  I have reviewed this patient's available data, including medical history, events of note, physical examination and test results as part of my evaluation  CC time x 32m  Mehreen Azizi V. MD 02/17/2014, 9:45 AM

## 2014-02-17 NOTE — Progress Notes (Addendum)
Subjective: Patient lethargic but easily awakened.  Follows commands.  No seizures noted.  Remains on Keppra.  Objective: Current vital signs: BP 102/58  Pulse 90  Temp(Src) 99.3 F (37.4 C) (Core (Comment))  Resp 17  Ht 5\' 6"  (1.676 m)  Wt 58.4 kg (128 lb 12 oz)  BMI 20.79 kg/m2  SpO2 100% Vital signs in last 24 hours: Temp:  [98.1 F (36.7 C)-99.3 F (37.4 C)] 99.3 F (37.4 C) (08/01 0600) Pulse Rate:  [88-164] 90 (08/01 0600) Resp:  [11-25] 17 (08/01 0600) BP: (59-140)/(30-95) 102/58 mmHg (08/01 0600) SpO2:  [94 %-100 %] 100 % (08/01 0600) Arterial Line BP: (59-130)/(30-79) 108/57 mmHg (08/01 0600) Weight:  [58.4 kg (128 lb 12 oz)] 58.4 kg (128 lb 12 oz) (08/01 0500)  Intake/Output from previous day: 07/31 0701 - 08/01 0700 In: 1317.5 [I.V.:567.5; IV Piggyback:750] Out: 2185 [Urine:2185] Intake/Output this shift:   Nutritional status: NPO  Neurologic Exam: Mental Status:  Lethargic.  Easily awakened by calling the patient's name.  Follows commands. Speech fluent but raspy.  Cranial Nerves:  II: Discs flat bilaterally; Visual fields grossly normal, pupils equal, round, reactive to light and accommodation  III,IV, VI: ptosis not present, extra-ocular motions intact bilaterally  V,VII: smile symmetric, facial light touch sensation normal bilaterally  VIII: hearing normal bilaterally  IX,X: gag reflex present  XI: bilateral shoulder shrug  XII: midline tongue extension  Motor:  Moves all extremities against gravity equally and to command Sensory: Sensation intact throughout, bilaterally  Plantars:  Right: downgoing   Left: downgoing      Lab Results: Basic Metabolic Panel:  Recent Labs Lab 02/13/14 2253  02/16/14 0800 02/16/14 1200 02/16/14 1500 02/16/14 2000 02/17/14 0550 02/17/14 0620  NA 141  < > 140 139 141 141 141 142  K 3.1*  < > 3.7 3.9 3.9 3.5* 3.8 3.8  CL 96  < > 103 106 107 107 106 106  CO2 18*  < > 24 23 24 24 22 23   GLUCOSE 272*  < > 99  103* 117* 110* 95 100*  BUN 5*  < > <3* <3* <3* <3* 3* 3*  CREATININE 0.47*  0.46*  < > 0.41* 0.37* 0.35* 0.37* 0.33* 0.33*  CALCIUM 7.2*  < > 7.4* 7.3* 7.4* 7.3* 7.3* 7.6*  MG 2.4  < > 1.4* 1.3* 2.2 2.2 1.8  --   PHOS 2.7  --   --   --   --   --   --   --   < > = values in this interval not displayed.  Liver Function Tests:  Recent Labs Lab 02/13/14 2050 02/15/14 0805  AST 384* 64*  ALT 50* 17  ALKPHOS 171* 96  BILITOT 3.4* 0.9  PROT 7.1 4.8*  ALBUMIN 4.2 2.4*    Recent Labs Lab 02/13/14 2050  LIPASE 42    Recent Labs Lab 02/13/14 2118  AMMONIA 73*    CBC:  Recent Labs Lab 02/14/14 0455 02/14/14 0927 02/15/14 0445 02/16/14 0525 02/17/14 0550 02/17/14 0620  WBC 6.0  --  3.8* 3.6* 2.9* 2.9*  NEUTROABS  --   --  3.1 2.9  --   --   HGB 9.8*  --  9.7* 8.1* 7.3* 7.1*  HCT 30.2*  --  29.6* 25.7* 23.0* 22.1*  MCV 91.5  --  90.0 93.5 92.7 92.9  PLT 66* 91* 51* 50* 39* 38*    Cardiac Enzymes: No results found for this basename: CKTOTAL, CKMB, CKMBINDEX, TROPONINI,  in the last 168 hours  Lipid Panel: No results found for this basename: CHOL, TRIG, HDL, CHOLHDL, VLDL, LDLCALC,  in the last 168 hours  CBG:  Recent Labs Lab 02/16/14 0749 02/16/14 1133 02/16/14 1643 02/16/14 2349 02/17/14 0737  GLUCAP 89 82 126* 108* 95    Microbiology: Results for orders placed during the hospital encounter of 02/13/14  MRSA PCR SCREENING     Status: None   Collection Time    02/14/14 12:27 AM      Result Value Ref Range Status   MRSA by PCR NEGATIVE  NEGATIVE Final   Comment:            The GeneXpert MRSA Assay (FDA     approved for NASAL specimens     only), is one component of a     comprehensive MRSA colonization     surveillance program. It is not     intended to diagnose MRSA     infection nor to guide or     monitor treatment for     MRSA infections.    Coagulation Studies:  Recent Labs  02/14/14 0920 02/14/14 0927  LABPROT 14.8 15.3*  INR  1.16 1.21    Imaging: Dg Chest Port 1 View  02/16/2014   CLINICAL DATA:  Endotracheal tube placement.  EXAM: PORTABLE CHEST - 1 VIEW  COMPARISON:  February 15, 2014.  FINDINGS: Cardiomediastinal silhouette appears normal. Endotracheal tube is in grossly good position with distal tip 4 cm above the carina. Nasogastric tube is seen entering the stomach. No pneumothorax or significant pleural effusion is noted. Increased right basilar opacity is noted concerning for edema or pneumonia. Right internal jugular catheter line is noted with distal tip in expected position of the cavoatrial junction. Bony thorax is intact.  IMPRESSION: Endotracheal tube in grossly good position. Increased right basilar opacity is noted medially concerning for worsening edema or pneumonia.   Electronically Signed   By: Sabino Dick M.D.   On: 02/16/2014 07:38    Medications:  I have reviewed the patient's current medications. Scheduled: . antiseptic oral rinse  7 mL Mouth Rinse QID  . chlorhexidine  15 mL Mouth Rinse BID  . folic acid  1 mg Intravenous Daily  . levETIRAcetam  1,000 mg Intravenous BID  . pantoprazole (PROTONIX) IV  40 mg Intravenous QHS  . thiamine  100 mg Intravenous Daily    Assessment/Plan: Patient lethargic.  Requiring Ativan frequently for agitation.  No seizures noted.  With history of alcohol withdrawal would not be surprised if the patient exhibits some agitation.  Unclear if seizures related to ETOH withdrawal or multiple metabolic abnormalities that the patient presented with.  For now would continue on AED's.    Recommendations: 1. Continue Keppra at 1000mg  BID 2. Continued CIWA protocol 3. Will continue to follow with you   LOS: 4 days   Alexis Goodell, MD [facility] [phone] 02/17/2014  8:23 AM

## 2014-02-17 NOTE — Progress Notes (Signed)
Patient ID: Kristen Roberson, female   DOB: Sep 14, 1975, 38 y.o.   MRN: [id number]   SUBJECTIVE: 38 y.o. F with hx of alcohol abuse brought to ED on 7/27.  Likely EtOH withdrawal seizure followed shortly thereafter by V-fib arrest. Intubated, arctic sun protocol. Found to be profoundly hypokalemic, magnesemic, hypocalcemic and echo showed LVEF 20-25% with apical akinesis.   Extubated. Now off norepinephrine.  She is very sedated this morning and sleeping (getting Ativan).   ECG this morning shows QTc 522 msec.   EEG with a moderate metabolic/toxic/anoxic encephalopathy   Scheduled Meds: . antiseptic oral rinse  7 mL Mouth Rinse QID  . carvedilol  3.125 mg Oral BID WC  . chlorhexidine  15 mL Mouth Rinse BID  . folic acid  1 mg Intravenous Daily  . levETIRAcetam  1,000 mg Intravenous BID  . magnesium oxide  400 mg Oral Daily  . magnesium sulfate  1 g Intravenous Once  . pantoprazole (PROTONIX) IV  40 mg Intravenous QHS  . potassium chloride  40 mEq Oral Daily  . thiamine  100 mg Intravenous Daily   Continuous Infusions: . dexmedetomidine 0.499 mcg/kg/hr (02/17/14 0100)  . norepinephrine (LEVOPHED) Adult infusion Stopped (02/17/14 0305)   PRN Meds:.sodium chloride, fentaNYL, HYDROmorphone (DILAUDID) injection, LORazepam    Filed Vitals:   02/17/14 0600 02/17/14 0700 02/17/14 0800 02/17/14 0900  BP: 102/58  110/68   Pulse: 90 91 91 92  Temp: 99.3 F (37.4 C)  98.6 F (37 C)   TempSrc: Core (Comment)  Core (Comment)   Resp: 17 17 20 20   Height:      Weight:      SpO2: 100% 100% 100% 98%    Intake/Output Summary (Last 24 hours) at 02/17/14 0935 Last data filed at 02/17/14 0900  Gross per 24 hour  Intake 1271.2 ml  Output   1970 ml  Net -698.8 ml    LABS: Basic Metabolic Panel:  Recent Labs  02/16/14 2000 02/17/14 0550 02/17/14 0620  NA 141 141 142  K 3.5* 3.8 3.8  CL 107 106 106  CO2 24 22 23   GLUCOSE 110* 95 100*  BUN <3* 3* 3*  CREATININE 0.37* 0.33* 0.33*    CALCIUM 7.3* 7.3* 7.6*  MG 2.2 1.8  --    Liver Function Tests:  Recent Labs  02/15/14 0805  AST 64*  ALT 17  ALKPHOS 96  BILITOT 0.9  PROT 4.8*  ALBUMIN 2.4*   No results found for this basename: LIPASE, AMYLASE,  in the last 72 hours CBC:  Recent Labs  02/15/14 0445 02/16/14 0525 02/17/14 0550 02/17/14 0620  WBC 3.8* 3.6* 2.9* 2.9*  NEUTROABS 3.1 2.9  --   --   HGB 9.7* 8.1* 7.3* 7.1*  HCT 29.6* 25.7* 23.0* 22.1*  MCV 90.0 93.5 92.7 92.9  PLT 51* 50* 39* 38*   Cardiac Enzymes: No results found for this basename: CKTOTAL, CKMB, CKMBINDEX, TROPONINI,  in the last 72 hours BNP: No components found with this basename: POCBNP,  D-Dimer: No results found for this basename: DDIMER,  in the last 72 hours Hemoglobin A1C: No results found for this basename: HGBA1C,  in the last 72 hours Fasting Lipid Panel: No results found for this basename: CHOL, HDL, LDLCALC, TRIG, CHOLHDL, LDLDIRECT,  in the last 72 hours Thyroid Function Tests: No results found for this basename: TSH, T4TOTAL, FREET3, T3FREE, THYROIDAB,  in the last 72 hours Anemia Panel: No results found for this basename: VITAMINB12, FOLATE, FERRITIN,  TIBC, IRON, RETICCTPCT,  in the last 72 hours  RADIOLOGY: Ct Angio Head W/cm &/or Wo Cm  02/13/2014   CLINICAL DATA:  Unresponsive.  EXAM: CT ANGIOGRAPHY HEAD  TECHNIQUE: Multidetector CT imaging of the head was performed using the standard protocol during bolus administration of intravenous contrast. Multiplanar CT image reconstructions and MIPs were obtained to evaluate the vascular anatomy.  CONTRAST:  40mL OMNIPAQUE IOHEXOL 350 MG/ML SOLN  COMPARISON:  None.  FINDINGS: CT head: The ventricles and sulci are normal. No intraparenchymal hemorrhage, mass effect nor midline shift. No acute large vascular territory infarcts. Postcontrast imaging demonstrates no abnormal parenchymal nor leptomeningeal enhancement.  No abnormal extra-axial fluid collections. Basal cisterns  are patent.  No skull fracture. The included ocular globes and orbital contents are non-suspicious. The mastoid aircells and included paranasal sinuses are well-aerated. Life support lines in place, seen on the localizer.  CTA head: Anterior circulation: Normal appearance of the cervical internal carotid arteries, petrous, cavernous and supra clinoid internal carotid arteries. Widely patent anterior communicating artery. Normal appearance of the anterior and middle cerebral arteries.  Posterior circulation: Left vertebral artery is dominant, with normal appearance of the vertebral arteries, vertebrobasilar junction and basilar artery, as well as main branch vessels. Small bilateral posterior communicating arteries are present. Normal appearance of the posterior cerebral arteries.  No large vessel occlusion, hemodynamically significant stenosis, dissection, luminal irregularity, contrast extravasation or aneurysm within the anterior nor posterior circulation.  Review of the MIP images confirms the above findings.  IMPRESSION: CT head: No acute intracranial process, normal examination. No abnormal enhancement.  CTA head:  No acute vascular process, normal examination.   Electronically Signed   By: Elon Alas   On: 02/13/2014 22:28   PHYSICAL EXAM General: Sedated but arousable and follows commands Neck: No JVD, no thyromegaly or thyroid nodule.  Lungs: Clear to auscultation bilaterally with normal respiratory effort. CV: Nondisplaced PMI.  Heart regular S1/S2, no S3/S4, no murmur.  No peripheral edema.   Abdomen: Soft, nontender, no hepatosplenomegaly, no distention.  Neurologic:Sedated but arousable and follows commands Extremities: No clubbing or cyanosis.   TELEMETRY: Reviewed telemetry pt in NSR  ASSESSMENT AND PLAN: 38 yo with history of depression, ETOH abuse, and prior electrolyte abnormalities had ventricular fibrillation arrest (cardioverted) and underwent hypothermia protocol.  Now off  norepinephrine.  1. Ventricular fibrillation: Patient has had prolonged QT interval over multiple prior ECGs.  She was hypokalemic, hypomagnesemic, and hypocalcemic on admission.  Suspect torsades in the setting of electrolyte abnormalities and also use of trazodone and fluoxetine.  She also is noted to have a cardiomyopathy on her post-arrest echo. EF 20-25% - Replaced all electrolytes, stopped fluoxetine and trazodone.  QT interval still long today.  Will give more K and Mg and repeat tomorrow.  ?congenital long QT syndrome => will need EP consult for possible ICD.  ETOH abuse/electrolyte abnormalities are significant confounders. 2. Cardiomyopathy: EF 20-25% with wall motion abnormalities (anteroseptal severe hypokinesis and inferobasal dyskinesis).  LV not dilated and atria normal in size, suggesting relative new onset to the cardiomyopathy.  It is possible this may be stunning or stress cardiomyopathy related to the arrest, but I am concerned with the wall motion abnormalities.  Will need cath on Monday (not able to discuss this with her given sedation).  If this is normal, would lean towards cardiac MRI to assess for evidence of cardiac sarcoidosis (inferobasal dyskinesis).  - Start Coreg 3.125 mg bid today, hopefully low dose lisinopril tomorrow.  3.  Hypokalemia/hypomagnesemia: Keep K >/=4, Mg >/= 2.  4. ETOH abuse - being treated for ETOH withdrawal. Continue Keppra and benzos as needed.   Loralie Champagne MD 02/17/2014 9:35 AM

## 2014-02-18 LAB — MAGNESIUM: Magnesium: 1.6 mg/dL (ref 1.5–2.5)

## 2014-02-18 LAB — CBC
HCT: 22.7 % — ABNORMAL LOW (ref 36.0–46.0)
HEMOGLOBIN: 7.3 g/dL — AB (ref 12.0–15.0)
MCH: 30.2 pg (ref 26.0–34.0)
MCHC: 32.2 g/dL (ref 30.0–36.0)
MCV: 93.8 fL (ref 78.0–100.0)
Platelets: 90 10*3/uL — ABNORMAL LOW (ref 150–400)
RBC: 2.42 MIL/uL — AB (ref 3.87–5.11)
RDW: 19.3 % — ABNORMAL HIGH (ref 11.5–15.5)
WBC: 4.5 10*3/uL (ref 4.0–10.5)

## 2014-02-18 LAB — BASIC METABOLIC PANEL
Anion gap: 17 — ABNORMAL HIGH (ref 5–15)
BUN: 7 mg/dL (ref 6–23)
CALCIUM: 8.2 mg/dL — AB (ref 8.4–10.5)
CHLORIDE: 104 meq/L (ref 96–112)
CO2: 20 meq/L (ref 19–32)
CREATININE: 0.44 mg/dL — AB (ref 0.50–1.10)
GFR calc Af Amer: >90 mL/min (ref >90)
GFR calc non Af Amer: >90 mL/min (ref >90)
Glucose, Bld: 86 mg/dL (ref 70–99)
Potassium: 3.4 mEq/L — ABNORMAL LOW (ref 3.7–5.3)
Sodium: 141 mEq/L (ref 137–147)

## 2014-02-18 LAB — GLUCOSE, CAPILLARY
Glucose-Capillary: 84 mg/dL (ref 70–99)
Glucose-Capillary: 117 mg/dL — ABNORMAL HIGH (ref 70–99)

## 2014-02-18 MED ORDER — MAGNESIUM OXIDE 400 (241.3 MG) MG PO TABS
400.0000 mg | ORAL_TABLET | Freq: Two times a day (BID) | ORAL | Status: DC
  Administered 2014-02-18 – 2014-02-20 (×4): 400 mg via ORAL
  Filled 2014-02-18 (×8): qty 1

## 2014-02-18 MED ORDER — POTASSIUM CHLORIDE 10 MEQ/50ML IV SOLN
10.0000 meq | INTRAVENOUS | Status: AC
  Administered 2014-02-18 (×3): 10 meq via INTRAVENOUS
  Filled 2014-02-18 (×2): qty 50

## 2014-02-18 MED ORDER — MAGNESIUM SULFATE 50 % IJ SOLN
3.0000 g | Freq: Once | INTRAMUSCULAR | Status: AC
  Administered 2014-02-18: 3 g via INTRAVENOUS
  Filled 2014-02-18: qty 6

## 2014-02-18 MED ORDER — POTASSIUM CHLORIDE 20 MEQ PO PACK
40.0000 meq | PACK | Freq: Two times a day (BID) | ORAL | Status: DC
  Filled 2014-02-18 (×2): qty 2

## 2014-02-18 MED ORDER — POTASSIUM CHLORIDE CRYS ER 20 MEQ PO TBCR: 40.0000 meq | EXTENDED_RELEASE_TABLET | Freq: Two times a day (BID) | ORAL | Status: DC

## 2014-02-18 MED ORDER — LISINOPRIL 2.5 MG PO TABS
2.5000 mg | ORAL_TABLET | Freq: Every day | ORAL | Status: DC
  Filled 2014-02-18: qty 1

## 2014-02-18 MED ORDER — SODIUM CHLORIDE 0.9 % IV SOLN
INTRAVENOUS | Status: DC
  Administered 2014-02-18: 16:00:00 via INTRAVENOUS
  Administered 2014-02-18: 75 mL/h via INTRAVENOUS
  Administered 2014-02-19: 13:00:00 via INTRAVENOUS
  Administered 2014-02-20: 1000 mL via INTRAVENOUS
  Administered 2014-02-21: 09:00:00 via INTRAVENOUS

## 2014-02-18 MED ORDER — WHITE PETROLATUM GEL
Status: AC
  Administered 2014-02-18: 0.2
  Filled 2014-02-18: qty 5

## 2014-02-18 MED ORDER — POTASSIUM CHLORIDE CRYS ER 20 MEQ PO TBCR: 40.0000 meq | EXTENDED_RELEASE_TABLET | Freq: Once | ORAL | Status: DC

## 2014-02-18 MED ORDER — SPIRONOLACTONE 12.5 MG HALF TABLET
12.5000 mg | ORAL_TABLET | Freq: Every day | ORAL | Status: DC
  Administered 2014-02-18 – 2014-02-27 (×9): 12.5 mg via ORAL
  Filled 2014-02-18 (×10): qty 1

## 2014-02-18 MED ORDER — MAGNESIUM SULFATE 40 MG/ML IJ SOLN: 2.0000 g | Freq: Once | INTRAMUSCULAR | Status: DC

## 2014-02-18 MED ORDER — POTASSIUM CHLORIDE CRYS ER 20 MEQ PO TBCR
40.0000 meq | EXTENDED_RELEASE_TABLET | Freq: Two times a day (BID) | ORAL | Status: DC
  Administered 2014-02-18 (×2): 40 meq via ORAL
  Filled 2014-02-18 (×3): qty 2

## 2014-02-18 NOTE — Progress Notes (Signed)
PT Cancellation Note  Patient Details Name: Kristen Roberson MRN: [id number] DOB: February 11, 1976   Cancelled Treatment:    Reason Eval Not Completed: Medical issues which prohibited therapy  1) attempted seeing pt at 1505 and EKG had been ordered but not yet done due to ?chest pain per RN 2) now noted EKG reports ?anterior MI  Will follow-up with pt 02/19/14   Shylin Keizer 02/18/2014, 4:23 PM Pager [phone]

## 2014-02-18 NOTE — Evaluation (Signed)
Clinical/Bedside Swallow Evaluation Patient Details  Name: Kristen Roberson MRN: [id number] Date of Birth: 06-03-1976  Today's Date: 02/18/2014 Time: 0850-0910 SLP Time Calculation (min): 20 min  Past Medical History:  Past Medical History  Diagnosis Date  . Proctitis   . Cysts of both ovaries   . Seizures   . Anemia   . Anxiety   . Blood transfusion without reported diagnosis   . Depression   . Fatty liver 10/05/13   Past Surgical History:  Past Surgical History  Procedure Laterality Date  . Ovarian cyst removal    . Laparoscopy N/A 09/28/2013    Procedure: LAPAROSCOPY OPERATIVE;  Surgeon: Terrance Mass, MD;  Location: [facility];  Service: Gynecology;  Laterality: N/A;  . Laparoscopic appendectomy Right 09/28/2013    Procedure: APPENDECTOMY LAPAROSCOPIC;  Surgeon: Terrance Mass, MD;  Location: [facility];  Service: Gynecology;  Laterality: Right;  . Salpingoophorectomy Right 09/28/2013    Procedure: SALPINGO OOPHORECTOMY;  Surgeon: Terrance Mass, MD;  Location: [facility];  Service: Gynecology;  Laterality: Right;  . Colonoscopy N/A 09/30/2013    Procedure: COLONOSCOPY;  Surgeon: Lafayette Dragon, MD;  Location: [facility];  Service: Endoscopy;  Laterality: N/A;  . Esophagogastroduodenoscopy N/A 11/23/2013    Procedure: ESOPHAGOGASTRODUODENOSCOPY (EGD);  Surgeon: Jerene Bears, MD;  Location: [facility];  Service: Endoscopy;  Laterality: N/A;  . Appendectomy     HPI:  38 y.o. female with hx of alcohol abuse admitted to ED 7/27after having seizure at home. In ED, found to be hypomagnesemic, hypokalemic, hypocalcemic. 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 Intubated 7/27, extubated 7/31 (4 days). CXR 8/1 revealed R basilar opacity suspicious for PNA. Pt now lethargic and agitated, likely alcohol withdrawal- sedation. Bedside swallow eval ordered to evaluate swallow function post- extubation.   Assessment / Plan /  Recommendation Clinical Impression  The pt is alert and able to follow directions today, but currently demonstrating overt s/s of aspiration with thin liquids, including immediate cough/ throat clear following most trials. Pt did not show s/s of aspiration with puree trials but did require multiple swallows. Given these findings along with multiple risk factors including recent 4-day intubation, weak voice/ cough, and current lethargy, the pt is at high risk of aspiration. Pt did appear to tolerate ice chips well. Recommend that pt remain NPO with the exception of ice chips following thorough oral care and meds crushed in puree, only when fully alert. If difficulties arise with meds crushed, switch to alt means. Speech therapy will continue to follow for PO trials and consideration of objective evaluation to identify safest diet.     Aspiration Risk  Severe    Diet Recommendation NPO except meds;Ice chips PRN after oral care   Medication Administration: Crushed with puree (alt. means if difficulties arise)    Other  Recommendations Oral Care Recommendations: Oral care Q4 per protocol;Oral care prior to ice chips   Follow Up Recommendations       Frequency and Duration min 2x/week  2 weeks   Pertinent Vitals/Pain n/a    SLP Swallow Goals     Swallow Study Prior Functional Status       General HPI: 38 y.o. female with hx of alcohol abuse admitted to ED 7/27after having seizure at home. In ED, found to be hypomagnesemic, hypokalemic, hypocalcemic. 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  Intubated 7/27, extubated 7/31 (4 days). CXR 8/1 revealed R basilar opacity suspicious for PNA. Pt now lethargic and agitated, likely alcohol withdrawal- sedation. Bedside swallow eval ordered to evaluate swallow function post- extubation. Type of Study: Bedside swallow evaluation Diet Prior to this Study: NPO Temperature  Spikes Noted: No Respiratory Status: Nasal cannula History of Recent Intubation: Yes Length of Intubations (days): 4 days Date extubated: 02/16/14 Behavior/Cognition: Alert;Cooperative;Requires cueing;Pleasant mood Oral Cavity - Dentition: Adequate natural dentition Self-Feeding Abilities: Able to feed self Patient Positioning: Upright in bed Baseline Vocal Quality: Breathy;Hoarse Volitional Cough: Weak;Congested Volitional Swallow: Able to elicit    Oral/Motor/Sensory Function Overall Oral Motor/Sensory Function: Appears within functional limits for tasks assessed Labial ROM: Within Functional Limits Labial Symmetry: Within Functional Limits Labial Strength: Within Functional Limits Labial Sensation: Within Functional Limits Lingual ROM: Within Functional Limits Lingual Symmetry: Within Functional Limits Lingual Strength: Within Functional Limits Lingual Sensation: Within Functional Limits   Ice Chips Ice chips: Within functional limits Presentation: Spoon   Thin Liquid Thin Liquid: Impaired Presentation: Cup Pharyngeal  Phase Impairments: Throat Clearing - Immediate;Cough - Immediate    Nectar Thick Nectar Thick Liquid: Not tested   Honey Thick Honey Thick Liquid: Not tested   Puree Puree: Impaired Presentation: Spoon Pharyngeal Phase Impairments: Multiple swallows   Solid   GO    Solid: Not tested       Gilmore Laroche, Vickee Mormino K, MA, CCC-SLP 02/18/2014,9:17 AM

## 2014-02-18 NOTE — Progress Notes (Signed)
Notified cards PA of pt complaints of pain, EKG results.

## 2014-02-18 NOTE — Progress Notes (Signed)
PT Cancellation Note  Patient Details Name: JULLISA GRIGORYAN MRN: [id number] DOB: 1975-12-26   Cancelled Treatment:    Reason Eval/Treat Not Completed: Medical issues which prohibited therapy HR at rest 132 (not yet time for pt to have anything else per RN) and with low K+ (has not yet been repleted). Will attempt later today as schedule permits and if medically appropriate.   Nene Aranas 02/18/2014, 9:31 AM Pager [phone]

## 2014-02-18 NOTE — Progress Notes (Signed)
PULMONARY / CRITICAL CARE MEDICINE   Name: Kristen Roberson MRN: [id number] DOB: 03/15/1976    ADMISSION DATE:  02/13/2014 CONSULTATION DATE:  02/18/2014  REFERRING MD :  EDP  CHIEF COMPLAINT:  Status epilepticus complicated by V.fib arrest  INITIAL PRESENTATION: 38 y.o. F with hx of alcohol abuse brought to ED on 7/27 likely EtOH withdrawal seizure followed shortly thereafter by V-fib arrest.  Intubated, arctic sun protocol.  Found to be profoundly hypokalemic, magnesemic, hypocalcemic and echo showed LVEF 20-25% with apical akinesis.  Was interactive after rewarming.  STUDIES:  7/27 CTA head >>> NAICP 7/27 Continuous EEG >>> 7/29 TTE > LVEF 20-25%, severe hypokinesis anteroseptal myocardium, mild MR 7/29 transvaginal ultrasound > small intrauterine cyst, s/p R salpingo-oophrectomy, L ovary intact   SIGNIFICANT EVENTS: 7/27 witnessed seizure by pt's fiance while watching TV at home, led to v.fib arrest in the field. 7/27 intubated, hypothermia protocol initiated. 7/30 appropriate and interactive after rewarming, had 8-9 second run of VTach 8/2 off precedex   SUBJECTIVE:  Fluctuating mental status, suspect she is having hallucinations Afebrile Denies CP, dyspnea   VITAL SIGNS: Temp:  [97.5 F (36.4 C)-99.4 F (37.4 C)] 98.5 F (36.9 C) (08/02 0700) Pulse Rate:  [78-136] 135 (08/02 1000) Resp:  [12-31] 26 (08/02 1000) BP: (98-149)/(50-99) 136/86 mmHg (08/02 1000) SpO2:  [93 %-99 %] 97 % (08/02 1000) Weight:  [126 lb 1.7 oz (57.2 kg)] 126 lb 1.7 oz (57.2 kg) (08/02 0500) HEMODYNAMICS:   VENTILATOR SETTINGS:   INTAKE / OUTPUT: Intake/Output     08/01 0701 - 08/02 0700 08/02 0701 - 08/03 0700   I.V. (mL/kg) 315.1 (5.5) 176.3 (3.1)   IV Piggyback 300    Total Intake(mL/kg) 615.1 (10.8) 176.3 (3.1)   Urine (mL/kg/hr) 575 (0.4)    Total Output 575     Net +40.1 +176.3          PHYSICAL EXAMINATION:  Gen: Awake and confused HEENT: No LAN/JVD PULM: CTA B CV: RRR AB:  soft nt +bs Ext: warm, no edema, no clubbing, no cyanosis Derm: no obvious bruising Neuro: Confused, speech garbled at times. Seeing things   LABS:  CBC  Recent Labs Lab 02/17/14 0550 02/17/14 0620 02/18/14 0445  WBC 2.9* 2.9* 4.5  HGB 7.3* 7.1* 7.3*  HCT 23.0* 22.1* 22.7*  PLT 39* 38* 90*   Coag's  Recent Labs Lab 02/14/14 0140 02/14/14 0920 02/14/14 0927  APTT 26 33 31  INR 1.14 1.16 1.21   BMET  Recent Labs Lab 02/17/14 0550 02/17/14 0620 02/18/14 0445  NA 141 142 141  K 3.8 3.8 3.4*  CL 106 106 104  CO2 22 23 20   BUN 3* 3* 7  CREATININE 0.33* 0.33* 0.44*  GLUCOSE 95 100* 86   Electrolytes  Recent Labs Lab 02/13/14 2253  02/16/14 2000 02/17/14 0550 02/17/14 0620 02/18/14 0445  CALCIUM 7.2*  < > 7.3* 7.3* 7.6* 8.2*  MG 2.4  < > 2.2 1.8  --  1.6  PHOS 2.7  --   --   --   --   --   < > = values in this interval not displayed. Sepsis Markers  Recent Labs Lab 02/14/14 0311 02/14/14 0920 02/14/14 2212  LATICACIDVEN 8.6* 4.7* 0.9   ABG  Recent Labs Lab 02/13/14 2321 02/14/14 0257 02/15/14 0317  PHART 7.381 7.296* 7.490*  PCO2ART 33.5* 33.9* 24.1*  PO2ART 86.0 130.0* 137.0*   Liver Enzymes  Recent Labs Lab 02/13/14 2050 02/15/14 0805  AST 384*  64*  ALT 50* 17  ALKPHOS 171* 96  BILITOT 3.4* 0.9  ALBUMIN 4.2 2.4*   Cardiac Enzymes No results found for this basename: TROPONINI, PROBNP,  in the last 168 hours Glucose  Recent Labs Lab 02/16/14 1643 02/16/14 2349 02/17/14 0737 02/17/14 1150 02/17/14 1734 02/17/14 2317  GLUCAP 126* 108* 95 125* 84 109*    Imaging Dg Chest Port 1 View  02/17/2014   CLINICAL DATA:  Extubation.  EXAM: PORTABLE CHEST - 1 VIEW  COMPARISON:  02/16/2014  FINDINGS: Endotracheal and enteric tubes have been removed. Right jugular central venous catheter remains in place with tip projecting over the lower SVC. Cardiac silhouette is upper limits normal in size, unchanged. Hazy right basilar parenchymal  opacity is unchanged. No pleural effusion or pneumothorax is identified.  IMPRESSION: Interval removal of endotracheal and enteric tubes. Persistent right basilar opacity suspicious for pneumonia.   Electronically Signed   By: Logan Bores   On: 02/17/2014 11:11     ASSESSMENT / PLAN:  PULMONARY OETT 7/28 >7/31 A: Acute respiratory failure due to VFib arrest P:   O2 as needed Pulmonary toilet  CARDIOVASCULAR R IJ CVL 7/28 > R Radial aline 7/28 >8/1 A:  V.Fib arrest -due to hypomag/K and cardiomyopathy Shock > cardiogenic, resolved Cardiomyopathy > ischemic vs dilated vs shock from arrest/takotsubo VTach P:  Goal MAP > 65  per cardiology-cath, then EP input  RENAL A:   Hypokalemia Hypomagnesemia Hypocalcemia > presumably related to EtOH abuse Lactic acidosis > resolved P:   KVO IVF Keep K > 4, Mg > 2  BMET/Mg q12h Monitor calcium and replete as needed  GASTROINTESTINAL A:   GI prophylaxis P:   Sips and chips as tolerated Pantoprazole for stress ulcer prophylaxis  HEMATOLOGIC A:   VTE Prophylaxis Anemia - chronic. Thrombocytopenia > chronic, presumably related to EtOH P:  SCD's  Transfuse for Hgb < 7. CBC q shift.  INFECTIOUS A:   No evidence of infection P:   Monitor clinically.  ENDOCRINE A:   Hyperglycemia   P:   ICU hyperglycemia protocol.  NEUROLOGIC A:   Acute encephalopathy > improving ETOH abuse EtOH withdrawal seizure? > EEG in ICU without seizure activity P:   Thiamine / Folate. Continue home Keppra Neuro following  precedex  off , use ativan prn  OB/GYN:  A: Chronic vaginal bleeding P: Monitor Progesterone if worse   TODAY'S SUMMARY: 38 y/o female s/p VFib arrest after likely EtOH withdrawal seizure in setting of hypokalemia, hypomagnesemia and hypocalcemia, now with depressed EF on echo. Extubated 7/31. concern for ongoing withdrawal vs encephalopathy.   Richardson Landry Minor ACNP [facility] Pager [phone] till 3 pm If no  answer page [phone]  Independently examined pt, evaluated data & formulated above care plan with NP who scribed this note & edited by me. Rigoberto Noel MD

## 2014-02-18 NOTE — Progress Notes (Signed)
Pt fiance called out. Upon entry, noted pt to be grimacing and tense. Upon questioning pt nods yes that she is in pain but is unable to state where. At one point, nodded that her chest hurt, but subsequently denied. EKG ordered. 1mg  dilaudid given as ordered. Pt relaxed and returned to sleep.

## 2014-02-18 NOTE — Progress Notes (Signed)
Patient ID: Kristen Roberson, female   DOB: 11/14/1975, 38 y.o.   MRN: [id number]   SUBJECTIVE: 38 y.o. F with hx of alcohol abuse brought to ED on 7/27.  Likely EtOH withdrawal seizure followed shortly thereafter by V-fib arrest. Intubated, arctic sun protocol. Found to be profoundly hypokalemic, magnesemic, hypocalcemic and echo showed LVEF 20-25% with apical akinesis.   Extubated and off pressors.  Sedated and sleeping again this morning, she will open her eyes then goes back to sleep.  Has not passed swallow evaluation yet so not getting pills.  Can get meds in apple sauce.   ECG this morning shows QTc 546 msec but K and Mg still low.   EEG with a moderate metabolic/toxic/anoxic encephalopathy   Scheduled Meds: . antiseptic oral rinse  7 mL Mouth Rinse QID  . carvedilol  3.125 mg Oral BID WC  . chlorhexidine  15 mL Mouth Rinse BID  . folic acid  1 mg Intravenous Daily  . levETIRAcetam  1,000 mg Intravenous BID  . magnesium oxide  400 mg Oral BID  . magnesium sulfate 1 - 4 g bolus IVPB  2 g Intravenous Once  . potassium chloride  40 mEq Oral BID  . spironolactone  12.5 mg Oral Daily  . thiamine  100 mg Intravenous Daily  . white petrolatum       Continuous Infusions: . sodium chloride 75 mL/hr (02/18/14 0745)  . dexmedetomidine Stopped (02/17/14 1845)   PRN Meds:.sodium chloride, fentaNYL, HYDROmorphone (DILAUDID) injection, LORazepam    Filed Vitals:   02/18/14 0400 02/18/14 0500 02/18/14 0600 02/18/14 0700  BP: 109/63 106/65 111/76 117/50  Pulse: 136 119 128 117  Temp: 99.4 F (37.4 C)   98.5 F (36.9 C)  TempSrc: Oral   Oral  Resp: 23 19 23 23   Height:      Weight:  126 lb 1.7 oz (57.2 kg)    SpO2: 99% 96% 97% 94%    Intake/Output Summary (Last 24 hours) at 02/18/14 0939 Last data filed at 02/18/14 0700  Gross per 24 hour  Intake 579.85 ml  Output    575 ml  Net   4.85 ml    LABS: Basic Metabolic Panel:  Recent Labs  02/17/14 0550 02/17/14 0620  02/18/14 0445  NA 141 142 141  K 3.8 3.8 3.4*  CL 106 106 104  CO2 22 23 20   GLUCOSE 95 100* 86  BUN 3* 3* 7  CREATININE 0.33* 0.33* 0.44*  CALCIUM 7.3* 7.6* 8.2*  MG 1.8  --  1.6   Liver Function Tests: No results found for this basename: AST, ALT, ALKPHOS, BILITOT, PROT, ALBUMIN,  in the last 72 hours No results found for this basename: LIPASE, AMYLASE,  in the last 72 hours CBC:  Recent Labs  02/16/14 0525  02/17/14 0620 02/18/14 0445  WBC 3.6*  < > 2.9* 4.5  NEUTROABS 2.9  --   --   --   HGB 8.1*  < > 7.1* 7.3*  HCT 25.7*  < > 22.1* 22.7*  MCV 93.5  < > 92.9 93.8  PLT 50*  < > 38* 90*  < > = values in this interval not displayed. Cardiac Enzymes: No results found for this basename: CKTOTAL, CKMB, CKMBINDEX, TROPONINI,  in the last 72 hours BNP: No components found with this basename: POCBNP,  D-Dimer: No results found for this basename: DDIMER,  in the last 72 hours Hemoglobin A1C: No results found for this basename: HGBA1C,  in  the last 72 hours Fasting Lipid Panel: No results found for this basename: CHOL, HDL, LDLCALC, TRIG, CHOLHDL, LDLDIRECT,  in the last 72 hours Thyroid Function Tests: No results found for this basename: TSH, T4TOTAL, FREET3, T3FREE, THYROIDAB,  in the last 72 hours Anemia Panel: No results found for this basename: VITAMINB12, FOLATE, FERRITIN, TIBC, IRON, RETICCTPCT,  in the last 72 hours  RADIOLOGY: Ct Angio Head W/cm &/or Wo Cm  02/13/2014   CLINICAL DATA:  Unresponsive.  EXAM: CT ANGIOGRAPHY HEAD  TECHNIQUE: Multidetector CT imaging of the head was performed using the standard protocol during bolus administration of intravenous contrast. Multiplanar CT image reconstructions and MIPs were obtained to evaluate the vascular anatomy.  CONTRAST:  62mL OMNIPAQUE IOHEXOL 350 MG/ML SOLN  COMPARISON:  None.  FINDINGS: CT head: The ventricles and sulci are normal. No intraparenchymal hemorrhage, mass effect nor midline shift. No acute large vascular  territory infarcts. Postcontrast imaging demonstrates no abnormal parenchymal nor leptomeningeal enhancement.  No abnormal extra-axial fluid collections. Basal cisterns are patent.  No skull fracture. The included ocular globes and orbital contents are non-suspicious. The mastoid aircells and included paranasal sinuses are well-aerated. Life support lines in place, seen on the localizer.  CTA head: Anterior circulation: Normal appearance of the cervical internal carotid arteries, petrous, cavernous and supra clinoid internal carotid arteries. Widely patent anterior communicating artery. Normal appearance of the anterior and middle cerebral arteries.  Posterior circulation: Left vertebral artery is dominant, with normal appearance of the vertebral arteries, vertebrobasilar junction and basilar artery, as well as main branch vessels. Small bilateral posterior communicating arteries are present. Normal appearance of the posterior cerebral arteries.  No large vessel occlusion, hemodynamically significant stenosis, dissection, luminal irregularity, contrast extravasation or aneurysm within the anterior nor posterior circulation.  Review of the MIP images confirms the above findings.  IMPRESSION: CT head: No acute intracranial process, normal examination. No abnormal enhancement.  CTA head:  No acute vascular process, normal examination.   Electronically Signed   By: Elon Alas   On: 02/13/2014 22:28   PHYSICAL EXAM General: Sedated but arousable and follows commands Neck: No JVD, no thyromegaly or thyroid nodule.  Lungs: Clear to auscultation bilaterally with normal respiratory effort. CV: Nondisplaced PMI.  Heart regular S1/S2, no S3/S4, no murmur.  No peripheral edema.   Abdomen: Soft, nontender, no hepatosplenomegaly, no distention.  Neurologic:Sedated but arousable and follows commands Extremities: No clubbing or cyanosis.   TELEMETRY: Reviewed telemetry pt in NSR  ASSESSMENT AND PLAN: 38 yo with  history of depression, ETOH abuse, and prior electrolyte abnormalities had ventricular fibrillation arrest (cardioverted) and underwent hypothermia protocol.  Now off norepinephrine.  1. Ventricular fibrillation: Patient has had prolonged QT interval over multiple prior ECGs.  She was hypokalemic, hypomagnesemic, and hypocalcemic on admission.  Suspect torsades in the setting of electrolyte abnormalities and also use of trazodone and fluoxetine.  She also is noted to have a cardiomyopathy on her post-arrest echo. EF 20-25% - Replacing electrolytes, stopped fluoxetine and trazodone.  QT interval still long today but K and Mg low again.  Will give more K and Mg and repeat tomorrow.  ?congenital long QT syndrome => will need EP consult for possible ICD.  ETOH abuse/electrolyte abnormalities are significant confounders. 2. Cardiomyopathy: EF 20-25% with wall motion abnormalities (anteroseptal severe hypokinesis and inferobasal dyskinesis).  LV not dilated and atria normal in size, suggesting relative new onset to the cardiomyopathy.  It is possible this may be stunning or stress cardiomyopathy related  to the arrest, but I am concerned with the wall motion abnormalities.  Will need cardiac catheterization.  Could do this tomorrow but I have still not been able to have a meaningful conversation with her due to sedation.  Will need to see how her mental status is tomorrow.  If coronaries are normal, would lean towards cardiac MRI to assess for evidence of cardiac sarcoidosis (inferobasal dyskinesis).  - Did not get Coreg yesterday b/c cannot take pills.  Will have nurse crush it in applesauce today.  - Will start spironolactone to keep K up (can crush).  - If BP tolerates this, start lisinopril at low dose tomorrow.  3. Hypokalemia/hypomagnesemia: Keep K >/=4, Mg >/= 2.  Not getting K or Mg pills.  Will give IV and as powder today to get it in her.  4. ETOH abuse: being treated for ETOH withdrawal, getting Ativan  and sedated.    Loralie Champagne MD 02/18/2014 9:39 AM

## 2014-02-18 NOTE — Progress Notes (Signed)
Subjective: Patient awake but remains agitated.  Now off Precedex and receiving prn anxiolytics.  No seizure activity noted.  Remains on Keppra.    Objective: Current vital signs: BP 136/86  Pulse 135  Temp(Src) 98.5 F (36.9 C) (Oral)  Resp 26  Ht 5\' 6"  (1.676 m)  Wt 57.2 kg (126 lb 1.7 oz)  BMI 20.36 kg/m2  SpO2 97% Vital signs in last 24 hours: Temp:  [97.5 F (36.4 C)-99.4 F (37.4 C)] 98.5 F (36.9 C) (08/02 0700) Pulse Rate:  [78-136] 135 (08/02 1000) Resp:  [12-31] 26 (08/02 1000) BP: (98-149)/(50-99) 136/86 mmHg (08/02 1000) SpO2:  [93 %-99 %] 97 % (08/02 1000) Weight:  [57.2 kg (126 lb 1.7 oz)] 57.2 kg (126 lb 1.7 oz) (08/02 0500)  Intake/Output from previous day: 08/01 0701 - 08/02 0700 In: 615.1 [I.V.:315.1; IV Piggyback:300] Out: 575 [Urine:575] Intake/Output this shift: Total I/O In: 176.3 [I.V.:176.3] Out: -  Nutritional status: NPO  Neurologic Exam: Mental Status:  Lethargic. Easily awakened by calling the patient's name. Follows commands. Speech fluent but raspy. Knows that she is in the hospital but looks around before answering.   Cranial Nerves:  II: Discs flat bilaterally; Visual fields grossly normal, pupils equal, round, reactive to light and accommodation  III,IV, VI: ptosis not present, extra-ocular motions intact bilaterally  V,VII: smile symmetric, facial light touch sensation normal bilaterally  VIII: hearing normal bilaterally  IX,X: gag reflex present  XI: bilateral shoulder shrug  XII: midline tongue extension  Motor:  Moves all extremities against gravity equally and to command  Sensory: Sensation intact throughout, bilaterally  Plantars:  Right: downgoing   Left: downgoing    Lab Results: Basic Metabolic Panel:  Recent Labs Lab 02/13/14 2253  02/16/14 1200 02/16/14 1500 02/16/14 2000 02/17/14 0550 02/17/14 0620 02/18/14 0445  NA 141  < > 139 141 141 141 142 141  K 3.1*  < > 3.9 3.9 3.5* 3.8 3.8 3.4*  CL 96  < > 106 107  107 106 106 104  CO2 18*  < > 23 24 24 22 23 20   GLUCOSE 272*  < > 103* 117* 110* 95 100* 86  BUN 5*  < > <3* <3* <3* 3* 3* 7  CREATININE 0.47*  0.46*  < > 0.37* 0.35* 0.37* 0.33* 0.33* 0.44*  CALCIUM 7.2*  < > 7.3* 7.4* 7.3* 7.3* 7.6* 8.2*  MG 2.4  < > 1.3* 2.2 2.2 1.8  --  1.6  PHOS 2.7  --   --   --   --   --   --   --   < > = values in this interval not displayed.  Liver Function Tests:  Recent Labs Lab 02/13/14 2050 02/15/14 0805  AST 384* 64*  ALT 50* 17  ALKPHOS 171* 96  BILITOT 3.4* 0.9  PROT 7.1 4.8*  ALBUMIN 4.2 2.4*    Recent Labs Lab 02/13/14 2050  LIPASE 42    Recent Labs Lab 02/13/14 2118  AMMONIA 73*    CBC:  Recent Labs Lab 02/15/14 0445 02/16/14 0525 02/17/14 0550 02/17/14 0620 02/18/14 0445  WBC 3.8* 3.6* 2.9* 2.9* 4.5  NEUTROABS 3.1 2.9  --   --   --   HGB 9.7* 8.1* 7.3* 7.1* 7.3*  HCT 29.6* 25.7* 23.0* 22.1* 22.7*  MCV 90.0 93.5 92.7 92.9 93.8  PLT 51* 50* 39* 38* 90*    Cardiac Enzymes: No results found for this basename: CKTOTAL, CKMB, CKMBINDEX, TROPONINI,  in the last  168 hours  Lipid Panel: No results found for this basename: CHOL, TRIG, HDL, CHOLHDL, VLDL, LDLCALC,  in the last 168 hours  CBG:  Recent Labs Lab 02/16/14 2349 02/17/14 0737 02/17/14 1150 02/17/14 1734 02/17/14 2317  GLUCAP 108* 95 125* 67 109*    Microbiology: Results for orders placed during the hospital encounter of 02/13/14  MRSA PCR SCREENING     Status: None   Collection Time    02/14/14 12:27 AM      Result Value Ref Range Status   MRSA by PCR NEGATIVE  NEGATIVE Final   Comment:            The GeneXpert MRSA Assay (FDA     approved for NASAL specimens     only), is one component of a     comprehensive MRSA colonization     surveillance program. It is not     intended to diagnose MRSA     infection nor to guide or     monitor treatment for     MRSA infections.    Coagulation Studies: No results found for this basename: LABPROT,  INR,  in the last 72 hours  Imaging: Dg Chest Port 1 View  02/17/2014   CLINICAL DATA:  Extubation.  EXAM: PORTABLE CHEST - 1 VIEW  COMPARISON:  02/16/2014  FINDINGS: Endotracheal and enteric tubes have been removed. Right jugular central venous catheter remains in place with tip projecting over the lower SVC. Cardiac silhouette is upper limits normal in size, unchanged. Hazy right basilar parenchymal opacity is unchanged. No pleural effusion or pneumothorax is identified.  IMPRESSION: Interval removal of endotracheal and enteric tubes. Persistent right basilar opacity suspicious for pneumonia.   Electronically Signed   By: Logan Bores   On: 02/17/2014 11:11    Medications:  I have reviewed the patient's current medications. Scheduled: . antiseptic oral rinse  7 mL Mouth Rinse QID  . carvedilol  3.125 mg Oral BID WC  . chlorhexidine  15 mL Mouth Rinse BID  . folic acid  1 mg Intravenous Daily  . levETIRAcetam  1,000 mg Intravenous BID  . magnesium oxide  400 mg Oral BID  . magnesium sulfate 1 - 4 g bolus IVPB  3 g Intravenous Once  . potassium chloride  10 mEq Intravenous Q1 Hr x 3  . potassium chloride  40 mEq Oral BID  . spironolactone  12.5 mg Oral Daily  . thiamine  100 mg Intravenous Daily    Assessment/Plan: No further seizures but likely with ETOH withdrawal at this time.  Remains on Keppra.    Recommendations: 1.  Will continue to follow with you.     LOS: 5 days   Alexis Goodell, MD [facility] [phone] 02/18/2014  11:20 AM

## 2014-02-19 ENCOUNTER — Inpatient Hospital Stay (HOSPITAL_COMMUNITY): Payer: Self-pay

## 2014-02-19 DIAGNOSIS — I4729 Other ventricular tachycardia: Secondary | ICD-10-CM

## 2014-02-19 DIAGNOSIS — I472 Ventricular tachycardia: Secondary | ICD-10-CM

## 2014-02-19 LAB — BASIC METABOLIC PANEL
Anion gap: 16 — ABNORMAL HIGH (ref 5–15)
BUN: 7 mg/dL (ref 6–23)
CO2: 21 meq/L (ref 19–32)
Calcium: 8.3 mg/dL — ABNORMAL LOW (ref 8.4–10.5)
Chloride: 108 mEq/L (ref 96–112)
Creatinine, Ser: 0.34 mg/dL — ABNORMAL LOW (ref 0.50–1.10)
GFR calc Af Amer: >90 mL/min (ref >90)
GFR calc non Af Amer: >90 mL/min (ref >90)
GLUCOSE: 91 mg/dL (ref 70–99)
POTASSIUM: 3.4 meq/L — AB (ref 3.7–5.3)
SODIUM: 145 meq/L (ref 137–147)

## 2014-02-19 LAB — CBC
HEMATOCRIT: 24.5 % — AB (ref 36.0–46.0)
HEMOGLOBIN: 7.7 g/dL — AB (ref 12.0–15.0)
MCH: 29.7 pg (ref 26.0–34.0)
MCHC: 31.4 g/dL (ref 30.0–36.0)
MCV: 94.6 fL (ref 78.0–100.0)
Platelets: 169 10*3/uL (ref 150–400)
RBC: 2.59 MIL/uL — ABNORMAL LOW (ref 3.87–5.11)
RDW: 19.5 % — ABNORMAL HIGH (ref 11.5–15.5)
WBC: 4.1 10*3/uL (ref 4.0–10.5)

## 2014-02-19 LAB — MAGNESIUM: MAGNESIUM: 1.7 mg/dL (ref 1.5–2.5)

## 2014-02-19 LAB — GLUCOSE, CAPILLARY
GLUCOSE-CAPILLARY: 83 mg/dL (ref 70–99)
GLUCOSE-CAPILLARY: 79 mg/dL (ref 70–99)
Glucose-Capillary: 77 mg/dL (ref 70–99)
Glucose-Capillary: 86 mg/dL (ref 70–99)

## 2014-02-19 LAB — PHOSPHORUS: PHOSPHORUS: 3.5 mg/dL (ref 2.3–4.6)

## 2014-02-19 MED ORDER — HYDROMORPHONE HCL PF 1 MG/ML IJ SOLN
INTRAMUSCULAR | Status: AC
  Filled 2014-02-19: qty 1

## 2014-02-19 MED ORDER — IBUPROFEN 100 MG/5ML PO SUSP
400.0000 mg | Freq: Four times a day (QID) | ORAL | Status: DC | PRN
  Filled 2014-02-19: qty 20

## 2014-02-19 MED ORDER — FOLIC ACID 5 MG/ML IJ SOLN
1.0000 mg | Freq: Every day | INTRAMUSCULAR | Status: DC
  Administered 2014-02-19 – 2014-02-21 (×3): 1 mg via INTRAVENOUS
  Filled 2014-02-19 (×4): qty 0.2

## 2014-02-19 MED ORDER — MAGNESIUM SULFATE 40 MG/ML IJ SOLN
2.0000 g | Freq: Once | INTRAMUSCULAR | Status: AC
  Administered 2014-02-19: 2 g via INTRAVENOUS
  Filled 2014-02-19: qty 50

## 2014-02-19 MED ORDER — INSULIN ASPART 100 UNIT/ML ~~LOC~~ SOLN: 0.0000 [IU] | SUBCUTANEOUS | Status: DC

## 2014-02-19 MED ORDER — THIAMINE HCL 100 MG/ML IJ SOLN
100.0000 mg | Freq: Every day | INTRAMUSCULAR | Status: DC
  Administered 2014-02-19 – 2014-02-21 (×3): 100 mg via INTRAVENOUS
  Filled 2014-02-19 (×3): qty 1

## 2014-02-19 MED ORDER — CHLORHEXIDINE GLUCONATE 0.12 % MT SOLN
15.0000 mL | Freq: Two times a day (BID) | OROMUCOSAL | Status: DC
  Administered 2014-02-19 – 2014-02-20 (×3): 15 mL via OROMUCOSAL
  Filled 2014-02-19 (×2): qty 15

## 2014-02-19 MED ORDER — JEVITY 1.2 CAL PO LIQD
1000.0000 mL | ORAL | Status: DC
  Filled 2014-02-19 (×3): qty 1000

## 2014-02-19 MED ORDER — FOLIC ACID 1 MG PO TABS
1.0000 mg | ORAL_TABLET | Freq: Every day | ORAL | Status: DC
  Filled 2014-02-19: qty 1

## 2014-02-19 MED ORDER — HYDROMORPHONE HCL PF 1 MG/ML IJ SOLN
0.5000 mg | Freq: Once | INTRAMUSCULAR | Status: AC
  Administered 2014-02-19: 0.5 mg via INTRAVENOUS

## 2014-02-19 MED ORDER — VITAMIN B-1 100 MG PO TABS
100.0000 mg | ORAL_TABLET | Freq: Every day | ORAL | Status: DC
  Filled 2014-02-19: qty 1

## 2014-02-19 MED ORDER — CETYLPYRIDINIUM CHLORIDE 0.05 % MT LIQD
7.0000 mL | Freq: Two times a day (BID) | OROMUCOSAL | Status: DC
  Administered 2014-02-19 – 2014-02-20 (×4): 7 mL via OROMUCOSAL

## 2014-02-19 MED ORDER — LISINOPRIL 5 MG PO TABS
5.0000 mg | ORAL_TABLET | Freq: Every day | ORAL | Status: DC
  Administered 2014-02-20 – 2014-02-27 (×8): 5 mg via ORAL
  Filled 2014-02-19 (×9): qty 1

## 2014-02-19 MED ORDER — POTASSIUM CHLORIDE 10 MEQ/50ML IV SOLN
10.0000 meq | INTRAVENOUS | Status: AC
  Administered 2014-02-19 – 2014-02-20 (×3): 10 meq via INTRAVENOUS
  Filled 2014-02-19 (×3): qty 50

## 2014-02-19 MED ORDER — POTASSIUM CHLORIDE 10 MEQ/50ML IV SOLN
10.0000 meq | INTRAVENOUS | Status: AC
  Administered 2014-02-19 (×4): 10 meq via INTRAVENOUS
  Filled 2014-02-19 (×4): qty 50

## 2014-02-19 MED ORDER — CHLORHEXIDINE GLUCONATE 0.12 % MT SOLN
OROMUCOSAL | Status: AC
  Administered 2014-02-19: 15 mL via OROMUCOSAL
  Filled 2014-02-19: qty 15

## 2014-02-19 NOTE — Progress Notes (Addendum)
Panda placement attempted X 2. Patient uncooperative and placement unsuccessful.  Patient refuses panda placement, states that she wants to be put to sleep for procedure. Dr Tonie Griffith and informed of events.

## 2014-02-19 NOTE — Progress Notes (Signed)
NUTRITION FOLLOW UP/CONSULT FOR TF MANAGEMENT  Intervention:   Initiate Jevity 1.2 @ 60 ml/hr via nasogastric feeding tube, once placed.   Tube feeding regimen provides 1728 kcal, 80 grams of protein, and 1167 ml of H2O.   Nutrition Dx:   Inadequate oral intake related to inability to eat as evidenced by NPO - ongoing   Goal:   Pt will meet >90% of estimated nutritional needs - not met  Monitor:   Weights, labs, TF tolerance/advancement  Assessment:   Pt with hx of alcohol abuse admitted with VF arrest in setting of alcohol withdrawal.   Pt extubated 7/31 and failed swallow eval.  Consult received to start TF.   Pt with low potassium which is being repleted with supplemental IV potassium. Pt also on folic acid and thiamine.  Per RN she will attempt nasogastric tube placement soon. Per RN pt continues to ask for drinks despite failing swallow eval. Pt asleep, fiance at bedside.   Height: Ht Readings from Last 1 Encounters:  02/16/14 5' 6"  (1.676 m)    Weight Status:   Wt Readings from Last 1 Encounters:  02/19/14 126 lb 5.2 oz (57.3 kg)  Admit wt         110 lb 0.2 oz (49.9 kg)   Pt positive 3 Liters  Re-estimated needs:  Kcal: 1600-1800 Protein: 75-90 grams Fluid: >1.6 L/day  Skin: WDL  BM: 8/3  Diet Order: NPO   Intake/Output Summary (Last 24 hours) at 02/19/14 1018 Last data filed at 02/19/14 0900  Gross per 24 hour  Intake   2331 ml  Output    300 ml  Net   2031 ml   Labs:   Recent Labs Lab 02/13/14 2253  02/17/14 0550 02/17/14 0620 02/18/14 0445 02/19/14 0450  NA 141  < > 141 142 141 145  K 3.1*  < > 3.8 3.8 3.4* 3.4*  CL 96  < > 106 106 104 108  CO2 18*  < > 22 23 20 21   BUN 5*  < > 3* 3* 7 7  CREATININE 0.47*  0.46*  < > 0.33* 0.33* 0.44* 0.34*  CALCIUM 7.2*  < > 7.3* 7.6* 8.2* 8.3*  MG 2.4  < > 1.8  --  1.6 1.7  PHOS 2.7  --   --   --   --  3.5  GLUCOSE 272*  < > 95 100* 86 91  < > = values in this interval not displayed.  CBG (last  3)   Recent Labs  02/18/14 1603 02/18/14 2343 02/19/14 0743  GLUCAP 117* 86 83    Scheduled Meds: . antiseptic oral rinse  7 mL Mouth Rinse BID  . carvedilol  3.125 mg Oral BID WC  . chlorhexidine  15 mL Mouth Rinse BID  . folic acid  1 mg Intravenous Daily  . levETIRAcetam  1,000 mg Intravenous BID  . lisinopril  5 mg Oral Daily  . magnesium oxide  400 mg Oral BID  . magnesium sulfate 1 - 4 g bolus IVPB  2 g Intravenous Once  . potassium chloride  10 mEq Intravenous Q1 Hr x 4  . potassium chloride  40 mEq Oral BID  . spironolactone  12.5 mg Oral Daily  . thiamine IV  100 mg Intravenous Daily    Continuous Infusions: . sodium chloride 75 mL/hr at 02/18/14 [place], [place], [facility] [phone] Pager [phone] After Hours Pager

## 2014-02-19 NOTE — Progress Notes (Signed)
Rehab Admissions Coordinator Note:  Patient was screened by Laqueena Hinchey L for appropriateness for an Inpatient Acute Rehab Consult.  At this time, we are recommending Inpatient Rehab consult.  Fayth Trefry, PT 02/19/2014, 3:59 PM  I can be reached at [phone].

## 2014-02-19 NOTE — Progress Notes (Signed)
PULMONARY / CRITICAL CARE MEDICINE  Name: Kristen Roberson MRN: [id number] DOB: 1976/07/01    ADMISSION DATE:  02/13/2014 CONSULTATION DATE:  02/19/2014  REFERRING MD :  EDP  CHIEF COMPLAINT:  Status epilepticus complicated by VF arrest  INITIAL PRESENTATION: 38 yo with history of alcohol abuse brought to ED on 7/27 with VF arrest in setting of alcohol withdrawal and hypokalemia and hypomagnesemia.  STUDIES:  7/27 CTA head >>> nad 7/27 Continuous EEG >>> no seizures 7/29 TTE >>> EF 25%, severe hypokinesis anteroseptal myocardium, mild MR 7/29  Transvaginal ultrasound > small intrauterine cyst, s/p R salpingo-oophrectomy, L ovary intact  SIGNIFICANT EVENTS: 7/27 Witnessed seizure by pt's fiance while watching TV at home, led to v.fib arrest in the field. 7/27 Intubated, hypothermia protocol initiated. 7/30 Appropriate and interactive after rewarming, had 8-9 second run of VT 8/2   Off precedex  8/3   Failed SLP eval  INTERVAL HISTORY:  Improved mentation, failed SLP eval  VITAL SIGNS: Temp:  [98.7 F (37.1 C)-100.3 F (37.9 C)] 99.4 F (37.4 C) (08/03 0744) Pulse Rate:  [100-135] 103 (08/03 0341) Resp:  [18-30] 25 (08/03 0130) BP: (124-167)/(73-97) 141/90 mmHg (08/03 0744) SpO2:  [91 %-100 %] 95 % (08/03 0744) Weight:  [57.3 kg (126 lb 5.2 oz)] 57.3 kg (126 lb 5.2 oz) (08/03 0450)  HEMODYNAMICS:   VENTILATOR SETTINGS:   INTAKE / OUTPUT: Intake/Output     08/02 0701 - 08/03 0700 08/03 0701 - 08/04 0700   P.O. 100    I.V. (mL/kg) 1751.3 (30.6)    Other 250    IV Piggyback 406    Total Intake(mL/kg) 2507.3 (43.8)    Urine (mL/kg/hr) 175 (0.1) 125 (0.8)   Total Output 175 125   Net +2332.3 -125        Urine Occurrence 4 x    Stool Occurrence 1 x      PHYSICAL EXAMINATION: Gen: Resting in no distress HEENT: PERRL PULM: CTAB CV: Regular, no murmurs AB: Soft, bowel sounds present Ext: No edema Derm: Intact Neuro: Sleepy but arouses to voice, slow to answer but  appropriate  LABS:  CBC  Recent Labs Lab 02/17/14 0620 02/18/14 0445 02/19/14 0450  WBC 2.9* 4.5 4.1  HGB 7.1* 7.3* 7.7*  HCT 22.1* 22.7* 24.5*  PLT 38* 90* 169   Coag's  Recent Labs Lab 02/14/14 0140 02/14/14 0920 02/14/14 0927  APTT 26 33 31  INR 1.14 1.16 1.21   BMET  Recent Labs Lab 02/17/14 0620 02/18/14 0445 02/19/14 0450  NA 142 141 145  K 3.8 3.4* 3.4*  CL 106 104 108  CO2 23 20 21   BUN 3* 7 7  CREATININE 0.33* 0.44* 0.34*  GLUCOSE 100* 86 91   Electrolytes  Recent Labs Lab 02/13/14 2253  02/17/14 0550 02/17/14 0620 02/18/14 0445 02/19/14 0450  CALCIUM 7.2*  < > 7.3* 7.6* 8.2* 8.3*  MG 2.4  < > 1.8  --  1.6 1.7  PHOS 2.7  --   --   --   --  3.5  < > = values in this interval not displayed. Sepsis Markers  Recent Labs Lab 02/14/14 0311 02/14/14 0920 02/14/14 2212  LATICACIDVEN 8.6* 4.7* 0.9   ABG  Recent Labs Lab 02/13/14 2321 02/14/14 0257 02/15/14 0317  PHART 7.381 7.296* 7.490*  PCO2ART 33.5* 33.9* 24.1*  PO2ART 86.0 130.0* 137.0*   Liver Enzymes  Recent Labs Lab 02/13/14 2050 02/15/14 0805  AST 384* 64*  ALT 50* 17  ALKPHOS 171* 96  BILITOT 3.4* 0.9  ALBUMIN 4.2 2.4*   Cardiac Enzymes No results found for this basename: TROPONINI, PROBNP,  in the last 168 hours  Glucose  Recent Labs Lab 02/17/14 1734 02/17/14 2317 02/18/14 0734 02/18/14 1603 02/18/14 2343 02/19/14 0743  GLUCAP 84 109* 84 117* 86 83   IMAGING:   Dg Chest Port 1 View  02/19/2014   CLINICAL DATA:  Respiratory distress  EXAM: PORTABLE CHEST - 1 VIEW  COMPARISON:  02/17/2014  FINDINGS: Right IJ central line, tip at the lower SVC. Normal heart size and mediastinal contours for technique. Increasingly dense airspace disease in the lower lungs, right more than left. There may be small pleural effusions. No pulmonary edema. No pneumothorax.  IMPRESSION: Worsening bibasilar pneumonia.   Electronically Signed   By: Jorje Guild M.D.   On:  02/19/2014 05:21   Dg Chest Port 1 View  02/17/2014   CLINICAL DATA:  Extubation.  EXAM: PORTABLE CHEST - 1 VIEW  COMPARISON:  02/16/2014  FINDINGS: Endotracheal and enteric tubes have been removed. Right jugular central venous catheter remains in place with tip projecting over the lower SVC. Cardiac silhouette is upper limits normal in size, unchanged. Hazy right basilar parenchymal opacity is unchanged. No pleural effusion or pneumothorax is identified.  IMPRESSION: Interval removal of endotracheal and enteric tubes. Persistent right basilar opacity suspicious for pneumonia.   Electronically Signed   By: Logan Bores   On: 02/17/2014 11:11   ASSESSMENT / PLAN:  PULMONARY A: Acute respiratory failure in setting ov cardiac arrest / seizures, extubated 7/31 P:   Goal SpO2>92 Supplemental oxygen Incentive spirometry / flutter valve  CARDIOVASCULAR A:  VF arrest in setting of seizures, hypokalemia, hypomagnesemia Acute systolic congestive heart failure ( Takotsubo ? ) with RWMA P:  Goal MAP > 65  Cardiology following Cardiac cath before d/c Coreg / Lisinopril D/c CVL after G tube placed  RENAL A:   Hypokalemia Hypomagnesemia P:   Trend BMP Goal K > 4, Mg > 2  K 10 x 4 K 40 bid Mg 2 Mg 400 daily Aldactone NS@75   GASTROINTESTINAL A:   GIPx is not indicated Dysphagia Malnutrition P:   SLP to follow [employer] TF  HEMATOLOGIC A:   VTE Px Anemia, chronix P:  Goal Hb>=7 Trend CBC SCD  INFECTIOUS A:   No evidence of acute infection P:   Monitor clinically  ENDOCRINE A:   Normoglycemic P:   SSI while on TF  NEUROLOGIC A:   Acute encephalopathy / delirium, improving Alcohol abuse Alcohol withdrawal seizure Chest wall pain after CPR P:   Neurology following Thiamine / Folate Keppra D/c Ativan / Fentanyl / Dilaudid Start Ibuprofen PRN  OB/GYN:  A:  Chronic vaginal bleeding P: Monitor Progesterone if worse PT / OT  Transfer to SDU 8/3.  TRH  to assume care 8/4.  PCCM will sign off.  I have personally obtained history, examined patient, evaluated and interpreted laboratory and imaging results, reviewed medical records, formulated assessment / plan and placed orders.  Doree Fudge, MD Pulmonary and [place] Pager: [phone]  02/19/2014, 9:49 AM

## 2014-02-19 NOTE — Progress Notes (Signed)
Subjective: Patient is awake and alert.  Improving.  Reports that she is hungry.  No further seizures noted.  On Keppra.  Objective: Current vital signs: BP 141/90  Pulse 103  Temp(Src) 99.4 F (37.4 C) (Oral)  Resp 25  Ht 5\' 6"  (1.676 m)  Wt 57.3 kg (126 lb 5.2 oz)  BMI 20.40 kg/m2  SpO2 95% Vital signs in last 24 hours: Temp:  [98.7 F (37.1 C)-100.3 F (37.9 C)] 99.4 F (37.4 C) (08/03 0744) Pulse Rate:  [100-135] 103 (08/03 0341) Resp:  [18-30] 25 (08/03 0130) BP: (124-167)/(73-97) 141/90 mmHg (08/03 0744) SpO2:  [91 %-100 %] 95 % (08/03 0744) Weight:  [57.3 kg (126 lb 5.2 oz)] 57.3 kg (126 lb 5.2 oz) (08/03 0450)  Intake/Output from previous day: 08/02 0701 - 08/03 0700 In: 2507.3 [P.O.:100; I.V.:1751.3; IV Piggyback:406] Out: 175 [Urine:175] Intake/Output this shift: Total I/O In: -  Out: 125 [Urine:125] Nutritional status: NPO  Neurologic Exam: Mental Status:  Awake.  Alert.  Follows commands.  Knows she is in the hospital and that it is August.  Thinks that she is in [facility]  Speech fluent and less raspy.  Cranial Nerves:  II: Discs flat bilaterally; Visual fields grossly normal, pupils equal, round, reactive to light and accommodation  III,IV, VI: ptosis not present, extra-ocular motions intact bilaterally  V,VII: smile symmetric, facial light touch sensation normal bilaterally  VIII: hearing normal bilaterally  IX,X: gag reflex present  XI: bilateral shoulder shrug  XII: midline tongue extension  Motor:  Moves all extremities against gravity equally and to command  Sensory: Sensation intact throughout, bilaterally  Plantars:  Right: downgoing   Left: downgoing      Lab Results: Basic Metabolic Panel:  Recent Labs Lab 02/13/14 2253  02/16/14 1500 02/16/14 2000 02/17/14 0550 02/17/14 0620 02/18/14 0445 02/19/14 0450  NA 141  < > 141 141 141 142 141 145  K 3.1*  < > 3.9 3.5* 3.8 3.8 3.4* 3.4*  CL 96  < > 107 107 106 106 104 108   CO2 18*  < > 24 24 22 23 20 21   GLUCOSE 272*  < > 117* 110* 95 100* 86 91  BUN 5*  < > <3* <3* 3* 3* 7 7  CREATININE 0.47*  0.46*  < > 0.35* 0.37* 0.33* 0.33* 0.44* 0.34*  CALCIUM 7.2*  < > 7.4* 7.3* 7.3* 7.6* 8.2* 8.3*  MG 2.4  < > 2.2 2.2 1.8  --  1.6 1.7  PHOS 2.7  --   --   --   --   --   --  3.5  < > = values in this interval not displayed.  Liver Function Tests:  Recent Labs Lab 02/13/14 2050 02/15/14 0805  AST 384* 64*  ALT 50* 17  ALKPHOS 171* 96  BILITOT 3.4* 0.9  PROT 7.1 4.8*  ALBUMIN 4.2 2.4*    Recent Labs Lab 02/13/14 2050  LIPASE 42    Recent Labs Lab 02/13/14 2118  AMMONIA 73*    CBC:  Recent Labs Lab 02/15/14 0445 02/16/14 0525 02/17/14 0550 02/17/14 0620 02/18/14 0445 02/19/14 0450  WBC 3.8* 3.6* 2.9* 2.9* 4.5 4.1  NEUTROABS 3.1 2.9  --   --   --   --   HGB 9.7* 8.1* 7.3* 7.1* 7.3* 7.7*  HCT 29.6* 25.7* 23.0* 22.1* 22.7* 24.5*  MCV 90.0 93.5 92.7 92.9 93.8 94.6  PLT 51* 50* 39* 38* 90* 169    Cardiac Enzymes:  No results found for this basename: CKTOTAL, CKMB, CKMBINDEX, TROPONINI,  in the last 168 hours  Lipid Panel: No results found for this basename: CHOL, TRIG, HDL, CHOLHDL, VLDL, LDLCALC,  in the last 168 hours  CBG:  Recent Labs Lab 02/17/14 2317 02/18/14 0734 02/18/14 1603 02/18/14 2343 02/19/14 0743  GLUCAP 109* 84 117* 86 83    Microbiology: Results for orders placed during the hospital encounter of 02/13/14  MRSA PCR SCREENING     Status: None   Collection Time    02/14/14 12:27 AM      Result Value Ref Range Status   MRSA by PCR NEGATIVE  NEGATIVE Final   Comment:            The GeneXpert MRSA Assay (FDA     approved for NASAL specimens     only), is one component of a     comprehensive MRSA colonization     surveillance program. It is not     intended to diagnose MRSA     infection nor to guide or     monitor treatment for     MRSA infections.    Coagulation Studies: No results found for this  basename: LABPROT, INR,  in the last 72 hours  Imaging: Dg Chest Port 1 View  02/19/2014   CLINICAL DATA:  Respiratory distress  EXAM: PORTABLE CHEST - 1 VIEW  COMPARISON:  02/17/2014  FINDINGS: Right IJ central line, tip at the lower SVC. Normal heart size and mediastinal contours for technique. Increasingly dense airspace disease in the lower lungs, right more than left. There may be small pleural effusions. No pulmonary edema. No pneumothorax.  IMPRESSION: Worsening bibasilar pneumonia.   Electronically Signed   By: Jorje Guild M.D.   On: 02/19/2014 05:21   Dg Chest Port 1 View  02/17/2014   CLINICAL DATA:  Extubation.  EXAM: PORTABLE CHEST - 1 VIEW  COMPARISON:  02/16/2014  FINDINGS: Endotracheal and enteric tubes have been removed. Right jugular central venous catheter remains in place with tip projecting over the lower SVC. Cardiac silhouette is upper limits normal in size, unchanged. Hazy right basilar parenchymal opacity is unchanged. No pleural effusion or pneumothorax is identified.  IMPRESSION: Interval removal of endotracheal and enteric tubes. Persistent right basilar opacity suspicious for pneumonia.   Electronically Signed   By: Logan Bores   On: 02/17/2014 11:11    Medications:  I have reviewed the patient's current medications. Scheduled: . antiseptic oral rinse  7 mL Mouth Rinse BID  . carvedilol  3.125 mg Oral BID WC  . chlorhexidine  15 mL Mouth Rinse BID  . folic acid  1 mg Oral Daily  . levETIRAcetam  1,000 mg Intravenous BID  . lisinopril  5 mg Oral Daily  . magnesium oxide  400 mg Oral BID  . magnesium sulfate 1 - 4 g bolus IVPB  2 g Intravenous Once  . potassium chloride  10 mEq Intravenous Q1 Hr x 4  . potassium chloride  40 mEq Oral BID  . spironolactone  12.5 mg Oral Daily  . thiamine  100 mg Oral Daily    Assessment/Plan: Mental status improved.  Off Precedex and Fentanyl.  Getting Dilaudid for pain.  On Ativan QID.  Remains on Keppra.  No seizures noted.     Recommendations: 1.  Continue Keppra at current dose.   LOS: 6 days   Alexis Goodell, MD [facility] [phone] 02/19/2014  9:42 AM

## 2014-02-19 NOTE — Progress Notes (Signed)
[facility] Progress Note Patient Name: Kristen Roberson DOB: 1976/01/06 MRN: [id number]  Date of Service  02/19/2014   HPI/Events of Note   npo  eICU Interventions  KCL changed to IV   Intervention Category Intermediate Interventions: Electrolyte abnormality - evaluation and management  Journey Ratterman V. 02/19/2014, 8:41 PM

## 2014-02-19 NOTE — Progress Notes (Signed)
Subjective:  Pt S/P witnessed SCD/VF/Defib/ [employer]. Slow mentation. Chest wall/pleuritic CP  Objective:  Temp:  [98.7 F (37.1 C)-100.3 F (37.9 C)] 99.4 F (37.4 C) (08/03 0744) Pulse Rate:  [100-135] 103 (08/03 0341) Resp:  [18-30] 25 (08/03 0130) BP: (124-167)/(73-97) 141/90 mmHg (08/03 0744) SpO2:  [91 %-100 %] 95 % (08/03 0744) Weight:  [126 lb 5.2 oz (57.3 kg)] 126 lb 5.2 oz (57.3 kg) (08/03 0450) Weight change: 3.5 oz (0.1 kg)  Intake/Output from previous day: 08/02 0701 - 08/03 0700 In: 2507.3 [P.O.:100; I.V.:1751.3; IV Piggyback:406] Out: 175 [Urine:175]  Intake/Output from this shift:    Physical Exam: General appearance: alert and no distress Neck: no adenopathy, no carotid bruit, no JVD, supple, symmetrical, trachea midline and thyroid not enlarged, symmetric, no tenderness/mass/nodules Lungs: clear to auscultation bilaterally Heart: regular rate and rhythm, S1, S2 normal, no murmur, click, rub or gallop Extremities: extremities normal, atraumatic, no cyanosis or edema  Lab Results: Results for orders placed during the hospital encounter of 02/13/14 (from the past 48 hour(s))  GLUCOSE, CAPILLARY     Status: Abnormal   Collection Time    02/17/14 11:50 AM      Result Value Ref Range   Glucose-Capillary 125 (*) 70 - 99 mg/dL   Comment 1 Notify RN    GLUCOSE, CAPILLARY     Status: None   Collection Time    02/17/14  5:34 PM      Result Value Ref Range   Glucose-Capillary 84  70 - 99 mg/dL  GLUCOSE, CAPILLARY     Status: Abnormal   Collection Time    02/17/14 11:17 PM      Result Value Ref Range   Glucose-Capillary 109 (*) 70 - 99 mg/dL  BASIC METABOLIC PANEL     Status: Abnormal   Collection Time    02/18/14  4:45 AM      Result Value Ref Range   Sodium 141  137 - 147 mEq/L   Potassium 3.4 (*) 3.7 - 5.3 mEq/L   Chloride 104  96 - 112 mEq/L   CO2 20  19 - 32 mEq/L   Glucose, Bld 86  70 - 99 mg/dL   BUN 7  6 - 23 mg/dL   Creatinine, Ser  0.44 (*) 0.50 - 1.10 mg/dL   Calcium 8.2 (*) 8.4 - 10.5 mg/dL   GFR calc non Af Amer >90  >90 mL/min   GFR calc Af Amer >90  >90 mL/min   Comment: (NOTE)     The eGFR has been calculated using the CKD EPI equation.     This calculation has not been validated in all clinical situations.     eGFR's persistently <90 mL/min signify possible Chronic Kidney     Disease.   Anion gap 17 (*) 5 - 15  MAGNESIUM     Status: None   Collection Time    02/18/14  4:45 AM      Result Value Ref Range   Magnesium 1.6  1.5 - 2.5 mg/dL  CBC     Status: Abnormal   Collection Time    02/18/14  4:45 AM      Result Value Ref Range   WBC 4.5  4.0 - 10.5 K/uL   RBC 2.42 (*) 3.87 - 5.11 MIL/uL   Hemoglobin 7.3 (*) 12.0 - 15.0 g/dL   HCT 22.7 (*) 36.0 - 46.0 %   MCV 93.8  78.0 - 100.0 fL   MCH  30.2  26.0 - 34.0 pg   MCHC 32.2  30.0 - 36.0 g/dL   RDW 19.3 (*) 11.5 - 15.5 %   Platelets 90 (*) 150 - 400 K/uL   Comment: REPEATED TO VERIFY     CONSISTENT WITH PREVIOUS RESULT  GLUCOSE, CAPILLARY     Status: None   Collection Time    02/18/14  7:34 AM      Result Value Ref Range   Glucose-Capillary 84  70 - 99 mg/dL  GLUCOSE, CAPILLARY     Status: Abnormal   Collection Time    02/18/14  4:03 PM      Result Value Ref Range   Glucose-Capillary 117 (*) 70 - 99 mg/dL  GLUCOSE, CAPILLARY     Status: None   Collection Time    02/18/14 11:43 PM      Result Value Ref Range   Glucose-Capillary 86  70 - 99 mg/dL  BASIC METABOLIC PANEL     Status: Abnormal   Collection Time    02/19/14  4:50 AM      Result Value Ref Range   Sodium 145  137 - 147 mEq/L   Potassium 3.4 (*) 3.7 - 5.3 mEq/L   Chloride 108  96 - 112 mEq/L   CO2 21  19 - 32 mEq/L   Glucose, Bld 91  70 - 99 mg/dL   BUN 7  6 - 23 mg/dL   Creatinine, Ser 0.34 (*) 0.50 - 1.10 mg/dL   Calcium 8.3 (*) 8.4 - 10.5 mg/dL   GFR calc non Af Amer >90  >90 mL/min   GFR calc Af Amer >90  >90 mL/min   Comment: (NOTE)     The eGFR has been calculated using  the CKD EPI equation.     This calculation has not been validated in all clinical situations.     eGFR's persistently <90 mL/min signify possible Chronic Kidney     Disease.   Anion gap 16 (*) 5 - 15  MAGNESIUM     Status: None   Collection Time    02/19/14  4:50 AM      Result Value Ref Range   Magnesium 1.7  1.5 - 2.5 mg/dL  CBC     Status: Abnormal   Collection Time    02/19/14  4:50 AM      Result Value Ref Range   WBC 4.1  4.0 - 10.5 K/uL   RBC 2.59 (*) 3.87 - 5.11 MIL/uL   Hemoglobin 7.7 (*) 12.0 - 15.0 g/dL   HCT 24.5 (*) 36.0 - 46.0 %   MCV 94.6  78.0 - 100.0 fL   MCH 29.7  26.0 - 34.0 pg   MCHC 31.4  30.0 - 36.0 g/dL   RDW 19.5 (*) 11.5 - 15.5 %   Platelets 169  150 - 400 K/uL   Comment: DELTA CHECK NOTED     REPEATED TO VERIFY  PHOSPHORUS     Status: None   Collection Time    02/19/14  4:50 AM      Result Value Ref Range   Phosphorus 3.5  2.3 - 4.6 mg/dL  GLUCOSE, CAPILLARY     Status: None   Collection Time    02/19/14  7:43 AM      Result Value Ref Range   Glucose-Capillary 83  70 - 99 mg/dL    Imaging: Imaging results have been reviewed  Assessment/Plan:   1. Active Problems: 2.   Cardiac arrest 3.  Convulsions/seizures 4.   Time Spent Directly with Patient:  20 minutes  Length of Stay:  LOS: 6 days   1. VF Arrest- Prob related to [place]. She responded to 1 shock. QTc 477 msec yesterday. Will continue to replete K and Mg. QT interval prolonging drugs D/Cd.  2. LVD- EF 20% with WMA. No real cardiac risk factors. ? ETOH CM +/- related to arrest. Will need usual Rx including BB/ACE-I and Spironolactone (Needs NG or OGT). Agree with diagnostic cath prior to D/C to define anatomy given segmental WMA.  Lorretta Harp 02/19/2014, 9:18 AM

## 2014-02-19 NOTE — Progress Notes (Signed)
Speech Language Pathology Treatment: Dysphagia  Patient Details Name: Kristen Roberson MRN: [id number] DOB: [birth year] Today's Date: 02/19/2014 Time: 7034-0352 SLP Time Calculation (min): 15 min  Assessment / Plan / Recommendation Clinical Impression  Pt demonstrates ongoing, immediate signs of aspiration with puree and ice chips. Vocal quality is severely dysphonic. Suspect acute reversible, but at this time, severe oropharyngeal dysphagia following intubation. At this point pt is not safe for POs and would be unlikely to have success even with an objective test based on subjective observation. Recommend pt remain strict NPO. Hopeful that pt will have rapid short term improvement, but given that she has been NPO for quite a while and even medications in puree do not appear safe, will have to suggest short term alternate method for feeding and meds.    HPI HPI: 38 y.o. female with hx of alcohol abuse admitted to ED 7/27after having seizure at home. In ED, found to be hypomagnesemic, hypokalemic, hypocalcemic. 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 Intubated 7/27, extubated 7/31 (4 days). CXR 8/1 revealed R basilar opacity suspicious for PNA. Pt now lethargic and agitated, likely alcohol withdrawal- sedation. Bedside swallow eval ordered to evaluate swallow function post- extubation.   Pertinent Vitals NA  SLP Plan  Continue with current plan of care    Recommendations Diet recommendations: NPO Medication Administration: Via alternative means              General recommendations: Rehab consult Follow up Recommendations: Inpatient Rehab Plan: Continue with current plan of care    GO    [facility], [place] CCC-SLP [phone]  Lynann Beaver 02/19/2014, 8:49 AM

## 2014-02-19 NOTE — Evaluation (Signed)
Physical Therapy Evaluation Patient Details Name: DEMARIS BOUSQUET MRN: [id number] DOB: 19-Nov-1975 Today's Date: 02/19/2014   History of Present Illness  Adm 02/13/14 s/p seizure and v-fib arrest in setting of alcohol withdrawal. Intubated 7/28-7/31. PMHx- alcohol abuse, anxiety  Clinical Impression  Pt admitted with seizure followed by v-fib arrest and CPR administered. Pt has been lethargic and disoriented after extubation. When pt told why she is in the hospital, she is able to repeat back the information after 30 seconds. After 3 minutes, she is unable to recall. Pt currently with functional limitations due to the deficits listed below (see PT Problem List).  Pt will benefit from skilled PT to increase their independence and safety with mobility to allow discharge to the venue listed below.       Follow Up Recommendations CIR;Supervision/Assistance - 24 hour    Equipment Recommendations   (TBD)    Recommendations for Other Services OT consult;Rehab consult     Precautions / Restrictions Precautions Precautions: Fall      Mobility  Bed Mobility Overal bed mobility: Needs Assistance;+2 for physical assistance Bed Mobility: Sit to Sidelying         Sit to sidelying: Min assist;+2 for physical assistance General bed mobility comments: pt very sleepy with frequent eye-closing and difficulty following commands  Transfers Overall transfer level: Needs assistance Equipment used: 2 person hand held assist Transfers: Sit to/from Stand Sit to Stand: Mod assist         General transfer comment: very shakey in her legs requiring incr assist through UEs (despite her reports of sternal pain)  Ambulation/Gait Ambulation/Gait assistance: Mod assist;+2 physical assistance;+2 safety/equipment Ambulation Distance (Feet): 12 Feet Assistive device: 2 person hand held assist Gait Pattern/deviations: Step-through pattern;Decreased stride length;Drifts right/left   Gait velocity  interpretation: Below normal speed for age/gender General Gait Details: pt required max vc and tactile cues to advance her Legs first 3-4 steps and then beame automatic.   Stairs            Wheelchair Mobility    Modified Rankin (Stroke Patients Only)       Balance Overall balance assessment: Needs assistance Sitting-balance support: Bilateral upper extremity supported;Feet supported Sitting balance-Leahy Scale: Poor (at edge of chair)     Standing balance support: Bilateral upper extremity supported Standing balance-Leahy Scale: Poor Standing balance comment: flexes at her hips                             Pertinent Vitals/Pain HR 106-112 + pain in sternum/chest; pt unable to rate due to cognition    Home Living Family/patient expects to be discharged to:: Private residence Living Arrangements: Spouse/significant other Available Help at Discharge: Family;Available 24 hours/day (fiance (currently working odd jobs and can stay home)) Type of Home: Apartment Home Access: Stairs to enter Entrance Stairs-Rails: Psychiatric nurse of Steps: [place]: One level [place]: None Additional Comments: fiance reports he can be home with her as needed    Prior Function Level of Independence: Independent               Hand Dominance        Extremity/Trunk Assessment   Upper Extremity Assessment: Generalized weakness           Lower Extremity Assessment: Generalized weakness      Cervical / Trunk Assessment: Normal  Communication   Communication: Expressive difficulties (very low volume and unintelligible)  Cognition Arousal/Alertness: Lethargic  Behavior During Therapy: Flat affect Overall Cognitive Status: Impaired/Different from baseline Area of Impairment: Orientation;Attention;Memory;Following commands;Safety/judgement Orientation Level: Place;Time;Situation Current Attention Level: Focused Memory: Decreased  short-term memory Following Commands: Follows one step commands inconsistently;Follows one step commands with increased time Safety/Judgement: Decreased awareness of safety;Decreased awareness of deficits          General Comments General comments (skin integrity, edema, etc.): Pt with significant cognitive deficits (? meds vs lethargy vx ? anoxic brain injury    Exercises        Assessment/Plan    PT Assessment Patient needs continued PT services  PT Diagnosis Difficulty walking   PT Problem List Decreased strength;Decreased activity tolerance;Decreased balance;Decreased mobility;Decreased cognition;Decreased knowledge of use of DME;Decreased safety awareness  PT Treatment Interventions DME instruction;Gait training;Stair training;Functional mobility training;Therapeutic activities;Therapeutic exercise;Balance training;Neuromuscular re-education;Cognitive remediation   PT Goals (Current goals can be found in the Care Plan section) Acute Rehab PT Goals Patient Stated Goal: uanble  PT Goal Formulation: With patient/family Time For Goal Achievement: 02/26/14 Potential to Achieve Goals: Good    Frequency Min 4X/week   Barriers to discharge        Co-evaluation               End of Session Equipment Utilized During Treatment: Gait belt Activity Tolerance: Patient limited by fatigue Patient left: in bed;with call bell/phone within reach;with family/visitor present;with nursing/sitter in room Nurse Communication: Mobility status;Patient requests pain meds         Time: 2458-0998 PT Time Calculation (min): 22 min   Charges:   PT Evaluation $Initial PT Evaluation Tier I: 1 Procedure PT Treatments $Gait Training: 8-22 mins   PT G Codes:          Eknoor Novack 21-Feb-2014, 3:05 PM Pager [phone]

## 2014-02-20 ENCOUNTER — Inpatient Hospital Stay (HOSPITAL_COMMUNITY): Payer: Self-pay

## 2014-02-20 DIAGNOSIS — F1910 Other psychoactive substance abuse, uncomplicated: Secondary | ICD-10-CM

## 2014-02-20 DIAGNOSIS — R569 Unspecified convulsions: Secondary | ICD-10-CM

## 2014-02-20 DIAGNOSIS — F329 Major depressive disorder, single episode, unspecified: Secondary | ICD-10-CM

## 2014-02-20 DIAGNOSIS — F3289 Other specified depressive episodes: Secondary | ICD-10-CM

## 2014-02-20 DIAGNOSIS — R1310 Dysphagia, unspecified: Secondary | ICD-10-CM

## 2014-02-20 DIAGNOSIS — R404 Transient alteration of awareness: Secondary | ICD-10-CM

## 2014-02-20 DIAGNOSIS — G9340 Encephalopathy, unspecified: Secondary | ICD-10-CM

## 2014-02-20 LAB — BENZODIAZEPINE, QUANTITATIVE, URINE
Alprazolam metabolite (GC/LC/MS), ur confirm: NEGATIVE ng/mL (ref <25)
Clonazepam metabolite (GC/LC/MS), ur confirm: NEGATIVE ng/mL (ref <25)
Flurazepam GC/MS Conf: NEGATIVE ng/mL (ref <50)
Lorazepam (GC/LC/MS), ur confirm: 1721 ng/mL — ABNORMAL HIGH (ref <50)
Midazolam (GC/LC/MS), ur confirm: 38967 ng/mL — ABNORMAL HIGH (ref <50)
Nordiazepam GC/MS Conf: NEGATIVE ng/mL (ref <50)
Oxazepam GC/MS Conf: NEGATIVE ng/mL (ref <50)
Temazepam GC/MS Conf: NEGATIVE ng/mL (ref <50)
Triazolam metabolite (GC/LC/MS), ur confirm: NEGATIVE ng/mL (ref <50)

## 2014-02-20 LAB — BASIC METABOLIC PANEL
Anion gap: 17 — ABNORMAL HIGH (ref 5–15)
BUN: 5 mg/dL — AB (ref 6–23)
CHLORIDE: 107 meq/L (ref 96–112)
CO2: 19 mEq/L (ref 19–32)
Calcium: 7.9 mg/dL — ABNORMAL LOW (ref 8.4–10.5)
Creatinine, Ser: 0.36 mg/dL — ABNORMAL LOW (ref 0.50–1.10)
GFR calc Af Amer: >90 mL/min (ref >90)
GLUCOSE: 71 mg/dL (ref 70–99)
Potassium: 3.5 mEq/L — ABNORMAL LOW (ref 3.7–5.3)
Sodium: 143 mEq/L (ref 137–147)

## 2014-02-20 LAB — GLUCOSE, CAPILLARY
GLUCOSE-CAPILLARY: 110 mg/dL — AB (ref 70–99)
Glucose-Capillary: 73 mg/dL (ref 70–99)
Glucose-Capillary: 73 mg/dL (ref 70–99)
Glucose-Capillary: 75 mg/dL (ref 70–99)
Glucose-Capillary: 106 mg/dL — ABNORMAL HIGH (ref 70–99)

## 2014-02-20 LAB — MAGNESIUM
MAGNESIUM: 1.6 mg/dL (ref 1.5–2.5)
Magnesium: 1.5 mg/dL (ref 1.5–2.5)

## 2014-02-20 LAB — POTASSIUM: Potassium: 3.7 mEq/L (ref 3.7–5.3)

## 2014-02-20 MED ORDER — POTASSIUM CHLORIDE 10 MEQ/50ML IV SOLN
10.0000 meq | INTRAVENOUS | Status: AC
  Administered 2014-02-20 (×4): 10 meq via INTRAVENOUS
  Filled 2014-02-20 (×4): qty 50

## 2014-02-20 MED ORDER — FLUOXETINE HCL 20 MG PO CAPS
40.0000 mg | ORAL_CAPSULE | Freq: Every day | ORAL | Status: DC
  Administered 2014-02-21: 40 mg via ORAL
  Filled 2014-02-20: qty 2

## 2014-02-20 MED ORDER — FENTANYL CITRATE 0.05 MG/ML IJ SOLN
INTRAMUSCULAR | Status: AC
  Filled 2014-02-20: qty 2

## 2014-02-20 MED ORDER — FENTANYL CITRATE 0.05 MG/ML IJ SOLN
INTRAMUSCULAR | Status: AC
  Administered 2014-02-20: 50 ug via INTRAVENOUS
  Filled 2014-02-20: qty 2

## 2014-02-20 MED ORDER — IBUPROFEN 400 MG PO TABS
400.0000 mg | ORAL_TABLET | Freq: Four times a day (QID) | ORAL | Status: DC | PRN
  Administered 2014-02-20 – 2014-02-21 (×2): 400 mg via ORAL
  Filled 2014-02-20 (×2): qty 1

## 2014-02-20 MED ORDER — FLUOXETINE HCL 20 MG/5ML PO SOLN
40.0000 mg | Freq: Every day | ORAL | Status: DC
  Administered 2014-02-20: 40 mg via ORAL
  Filled 2014-02-20: qty 10

## 2014-02-20 MED ORDER — POTASSIUM CHLORIDE 20 MEQ/15ML (10%) PO LIQD: 20.0000 meq | Freq: Every day | ORAL | Status: DC

## 2014-02-20 MED ORDER — FENTANYL CITRATE 0.05 MG/ML IJ SOLN
50.0000 ug | INTRAMUSCULAR | Status: DC | PRN
  Administered 2014-02-20 – 2014-02-21 (×7): 50 ug via INTRAVENOUS
  Filled 2014-02-20 (×6): qty 2

## 2014-02-20 MED ORDER — HYDROMORPHONE HCL PF 1 MG/ML IJ SOLN
0.5000 mg | INTRAMUSCULAR | Status: DC | PRN
  Administered 2014-02-20 (×3): 1 mg via INTRAVENOUS
  Filled 2014-02-20 (×3): qty 1

## 2014-02-20 MED ORDER — POTASSIUM CHLORIDE CRYS ER 20 MEQ PO TBCR
20.0000 meq | EXTENDED_RELEASE_TABLET | Freq: Every day | ORAL | Status: DC
  Administered 2014-02-20: 20 meq via ORAL
  Filled 2014-02-20 (×2): qty 1

## 2014-02-20 MED ORDER — MAGNESIUM SULFATE 40 MG/ML IJ SOLN
2.0000 g | Freq: Once | INTRAMUSCULAR | Status: AC
  Administered 2014-02-20: 2 g via INTRAVENOUS
  Filled 2014-02-20: qty 50

## 2014-02-20 MED ORDER — FENTANYL CITRATE 0.05 MG/ML IJ SOLN
50.0000 ug | Freq: Once | INTRAMUSCULAR | Status: AC
  Administered 2014-02-20: 50 ug via INTRAVENOUS

## 2014-02-20 MED ORDER — CETYLPYRIDINIUM CHLORIDE 0.05 % MT LIQD
7.0000 mL | Freq: Two times a day (BID) | OROMUCOSAL | Status: DC
  Administered 2014-02-20 – 2014-02-22 (×5): 7 mL via OROMUCOSAL

## 2014-02-20 NOTE — Consult Note (Signed)
Physical Medicine and Rehabilitation Consult Reason for Consult: Deconditioning/V. fib arrest Referring Physician: [facility]   HPI: Kristen Roberson is a 38 y.o. right-handed and female history of alcohol abuse. Independent prior to admission living with her boyfriend. Admitted 02/13/2014 with reported seizure and decreased respirations. CPR was initiated. In the field she had V. fib arrest and received epinephrine times one. CT angiogram of the head was unremarkable. Patient with severe hypokalemia, hypomagnesemia. Urine drug screen and EtOH negative. Patient required intubation. Neurology services consulted and placed on Keppra for seizure disorder. She was later extubated with noted disorientation and lethargy. 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 Echocardiogram with ejection fraction of 25% severe hypokinesis. Cardiology services followup for V. fib arrest Plan diagnostic catheterization prior to discharge to define anatomy given segmented WMA. Patient is currently n.p.o. with plans for nasogastric tube. Physical therapy evaluation completed 02/19/2014 with recommendations of physical medicine rehabilitation consult.  Review of Systems  Psychiatric/Behavioral: Positive for depression.       Anxiety  All other systems reviewed and are negative.  Past Medical History  Diagnosis Date  . Proctitis   . Cysts of both ovaries   . Seizures   . Anemia   . Anxiety   . Blood transfusion without reported diagnosis   . Depression   . Fatty liver 10/05/13   Past Surgical History  Procedure Laterality Date  . Ovarian cyst removal    . Laparoscopy N/A 09/28/2013    Procedure: LAPAROSCOPY OPERATIVE;  Surgeon: Terrance Mass, MD;  Location: [facility];  Service: Gynecology;  Laterality: N/A;  . Laparoscopic appendectomy Right 09/28/2013    Procedure: APPENDECTOMY LAPAROSCOPIC;  Surgeon: Terrance Mass, MD;  Location: [facility];  Service: Gynecology;  Laterality: Right;  .  Salpingoophorectomy Right 09/28/2013    Procedure: SALPINGO OOPHORECTOMY;  Surgeon: Terrance Mass, MD;  Location: [facility];  Service: Gynecology;  Laterality: Right;  . Colonoscopy N/A 09/30/2013    Procedure: COLONOSCOPY;  Surgeon: Lafayette Dragon, MD;  Location: [facility];  Service: Endoscopy;  Laterality: N/A;  . Esophagogastroduodenoscopy N/A 11/23/2013    Procedure: ESOPHAGOGASTRODUODENOSCOPY (EGD);  Surgeon: Jerene Bears, MD;  Location: [facility];  Service: Endoscopy;  Laterality: N/A;  . Appendectomy     Family History  Problem Relation Age of Onset  . Diabetes Mother   . Hyperlipidemia Mother   . Stroke Mother   . Diabetes Father    Social History:  reports that she has never smoked. She has never used smokeless tobacco. She reports that she drinks alcohol. She reports that she does not use illicit drugs. Allergies:  Allergies  Allergen Reactions  . Morphine And Related Anaphylaxis     Tolerated hydromorphone on 11/25/13.   . Tramadol Other (See Comments)    Seizures   . Penicillins Other (See Comments)    Unknown childhood reaction.   Medications Prior to Admission  Medication Sig Dispense Refill  . acamprosate (CAMPRAL) 333 MG tablet Take 2 tablets (666 mg total) by mouth 3 (three) times daily with meals.  180 tablet  0  . traZODone (DESYREL) 25 mg TABS tablet Take 150 mg by mouth at bedtime as needed for sleep.      Marland Kitchen acetaminophen (TYLENOL) 325 MG tablet Take 650 mg by mouth every 6 (six) hours as needed for moderate pain.      Marland Kitchen FLUoxetine (PROZAC) 20 MG capsule Take 40 mg by mouth daily.      Marland Kitchen  tetrahydrozoline 0.05 % ophthalmic solution Place 1 drop into both eyes 2 (two) times daily as needed (dry eyes).        Home: Home Living Family/patient expects to be discharged to:: Private residence Living Arrangements: Spouse/significant other Available Help at Discharge: Family;Available 24 hours/day (fiance (currently working odd jobs and can stay home)) Type of  Home: [place] Access: Stairs to enter Technical brewer of Steps: 15 Entrance Stairs-Rails: [place]: One [place]: None Additional Comments: fiance reports he can be home with her as needed  Functional History: Prior Function Level of Independence: Independent Functional Status:  Mobility: Bed Mobility Overal bed mobility: Needs Assistance;+2 for physical assistance Bed Mobility: Sit to Sidelying Sit to sidelying: Min assist;+2 for physical assistance General bed mobility comments: pt very sleepy with frequent eye-closing and difficulty following commands Transfers Overall transfer level: Needs assistance Equipment used: 2 person hand held assist Transfers: Sit to/from Stand Sit to Stand: Mod assist General transfer comment: very shakey in her legs requiring incr assist through UEs (despite her reports of sternal pain) Ambulation/Gait Ambulation/Gait assistance: Mod assist;+2 physical assistance;+2 safety/equipment Ambulation Distance (Feet): 12 Feet Assistive device: 2 person hand held assist Gait Pattern/deviations: Step-through pattern;Decreased stride length;Drifts right/left Gait velocity interpretation: Below normal speed for age/gender General Gait Details: pt required max vc and tactile cues to advance her Legs first 3-4 steps and then beame automatic.     ADL:    Cognition: Cognition Overall Cognitive Status: Impaired/Different from baseline Orientation Level: Oriented to person;Oriented to place;Disoriented to time;Disoriented to situation Cognition Arousal/Alertness: Lethargic Behavior During Therapy: Flat affect Overall Cognitive Status: Impaired/Different from baseline Area of Impairment: Orientation;Attention;Memory;Following commands;Safety/judgement Orientation Level: Place;Time;Situation Current Attention Level: Focused Memory: Decreased short-term memory Following Commands: Follows one step commands  inconsistently;Follows one step commands with increased time Safety/Judgement: Decreased awareness of safety;Decreased awareness of deficits  Blood pressure 136/89, pulse 103, temperature 98.5 F (36.9 C), temperature source Oral, resp. rate 34, height 5\' 6"  (1.676 m), weight 59 kg (130 lb 1.1 oz), SpO2 90.00%, unknown if currently breastfeeding. Physical Exam  Constitutional:  38 year old female appearing older than stated age.  HENT:  Head: Normocephalic.  Eyes:  Pupils reactive to light  Neck: Normal range of motion. Neck supple. No thyromegaly present.  Cardiovascular:  Cardiac rate controlled  Respiratory: Effort normal and breath sounds normal. No respiratory distress.  GI: Soft. Bowel sounds are normal. She exhibits no distension.  Neurological:  Patient is lethargic but arousable. She was able to provide her name, age and place. She is a limited medical historian. She did follow simple commands. Mood was flat but appropriate during exam. Moves all 4's.  Skin: Skin is warm and dry.    Results for orders placed during the hospital encounter of 02/13/14 (from the past 24 hour(s))  GLUCOSE, CAPILLARY     Status: None   Collection Time    02/19/14  7:43 AM      Result Value Ref Range   Glucose-Capillary 83  70 - 99 mg/dL  GLUCOSE, CAPILLARY     Status: None   Collection Time    02/19/14  4:26 PM      Result Value Ref Range   Glucose-Capillary 79  70 - 99 mg/dL  GLUCOSE, CAPILLARY     Status: None   Collection Time    02/19/14 10:12 PM      Result Value Ref Range   Glucose-Capillary 77  70 - 99 mg/dL  GLUCOSE, CAPILLARY  Status: None   Collection Time    02/19/14 11:51 PM      Result Value Ref Range   Glucose-Capillary 73  70 - 99 mg/dL  BASIC METABOLIC PANEL     Status: Abnormal   Collection Time    02/20/14  3:53 AM      Result Value Ref Range   Sodium 143  137 - 147 mEq/L   Potassium 3.5 (*) 3.7 - 5.3 mEq/L   Chloride 107  96 - 112 mEq/L   CO2 19  19 - 32  mEq/L   Glucose, Bld 71  70 - 99 mg/dL   BUN 5 (*) 6 - 23 mg/dL   Creatinine, Ser 0.36 (*) 0.50 - 1.10 mg/dL   Calcium 7.9 (*) 8.4 - 10.5 mg/dL   GFR calc non Af Amer >90  >90 mL/min   GFR calc Af Amer >90  >90 mL/min   Anion gap 17 (*) 5 - 15  MAGNESIUM     Status: None   Collection Time    02/20/14  3:53 AM      Result Value Ref Range   Magnesium 1.5  1.5 - 2.5 mg/dL   Dg Chest Port 1 View  02/19/2014   CLINICAL DATA:  Respiratory distress  EXAM: PORTABLE CHEST - 1 VIEW  COMPARISON:  02/17/2014  FINDINGS: Right IJ central line, tip at the lower SVC. Normal heart size and mediastinal contours for technique. Increasingly dense airspace disease in the lower lungs, right more than left. There may be small pleural effusions. No pulmonary edema. No pneumothorax.  IMPRESSION: Worsening bibasilar pneumonia.   Electronically Signed   By: Jorje Guild M.D.   On: 02/19/2014 05:21    Assessment/Plan: Diagnosis: anoxic encephalopathy 1. Does the need for close, 24 hr/day medical supervision in concert with the patient's rehab needs make it unreasonable for this patient to be served in a less intensive setting? Potentially 2. Co-Morbidities requiring supervision/potential complications: cardiac arrest 3. Due to bladder management, bowel management, safety, skin/wound care, disease management, medication administration, pain management and patient education, does the patient require 24 hr/day rehab nursing? Potentially 4. Does the patient require coordinated care of a physician, rehab nurse, PT, OT, SLP to address physical and functional deficits in the context of the above medical diagnosis(es)? potentially Addressing deficits in the following areas: balance, endurance, locomotion, strength, transferring, bowel/bladder control, bathing, dressing, feeding, grooming, toileting and psychosocial support 5. Can the patient actively participate in an intensive therapy program of at least 3 hrs of therapy  per day at least 5 days per week? Potentially 6. The potential for patient to make measurable gains while on inpatient rehab is fair 7. Anticipated functional outcomes upon discharge from inpatient rehab are supervision  with PT, supervision with OT, supervision with SLP. 8. Estimated rehab length of stay to reach the above functional goals is: TBD 9. Does the patient have adequate social supports to accommodate these discharge functional goals? Potentially 10. Anticipated D/C setting: Home 11. Anticipated post D/C treatments: [place] therapy 12. Overall Rehab/Functional Prognosis: good  RECOMMENDATIONS: This patient's condition is appropriate for continued rehabilitative care in the following setting: most likely [place] or outpt therapies Patient has agreed to participate in recommended program. Potentially Note that insurance prior authorization may be required for reimbursement for recommended care.  Comment: Pt already at a min assist/ contact guard assistance level. Expect further improvement over next few days. Will follow along for now.  Meredith Staggers, MD, [facility] Physical Medicine & Rehabilitation     02/20/2014

## 2014-02-20 NOTE — Progress Notes (Signed)
[place] TEAM 1 - Stepdown/ICU TEAM Progress Note  Kristen Roberson:[id number] DOB: Jul 27, 1975 DOA: 02/13/2014 PCP: Angelica Chessman, MD  Admit HPI / Brief Narrative: 38 y.o. WF PMHx alcohol abuse, substance abuse brought to ED on 7/27 after she began to have seizure like activity while watching TV at home. Boyfriend states she began shaking and eyes rolled back into her head. A few seconds later, she stopped breathing. He began CPR and dispatched EMS. In the field, she had a V.fib arrest with ROSC after epi x 1 and 1 shock. In ED, found to be hypomagnesemic, hypokalemic, hypocalcemic. 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 PCCM consulted for admission and initiation of hypothermia protocol.   HPI/Subjective: 8./4  A/O. x4, complaints of sternal pain request more pain medication. However sitting in bed comfortably and consuming her lunch  Assessment/Plan:  Acute respiratory failure in setting ov cardiac arrest / seizures,  -Resolved  VF arrest in setting of seizures -Asymptomatic except for reproducible sternal chest pain caused by CPR -Continue Keppra 1000 mg BID per neurology -Continue to monitor closely  Acute systolic congestive heart failure -Continue Coreg 3.125 mg BID -Continue lisinopril 5 mg daily  -Continue spironolactone 12.5 mg daily  -Cardiology plans for catheterization later in the week -Maintain hemoglobin> 7  Seizures -See VF in setting of seizures  Acute encephalopathy / delirium, -Possible waxing and waning, when I examined patient A./O. x4 however neurology had examined patient earlier and their note indicates patient still confused at that time. Monitor closely  Hypokalemia -Potassium 10 mEq x4 -Potassium goal> 4  Hypomagnesemia -Magnesium IV 2 gm x 1 -Magnesium goal> 2  Dysphagia - Resolved -8/4 MBS; passed; started Dysphagia 3 (Mechanical Soft);Thin liquid    Anemia, chronic -Monitor closely  Alcohol abuse/substance abuse  -Negative signs of acute alcohol withdrawal -Continue folic acid 1 mg daily -DC Ativan / Fentanyl / Dilaudid  -Start Ibuprofen PRN   Depression -Restart home Prozac 40 mg daily     Code Status: FULL Family Communication: no family present at time of exam Disposition Plan: Per cardiology and neurology    Consultants: Dr. Donnella Bi (cardiology) Dr. Alexis Goodell (neurology) Dr. Dimple Casey (PCCM.)  Procedure/Significant Events: 7/27 Witnessed seizure by pt's fiance while watching TV at home, led to v.fib arrest in the field.  7/27 Intubated, hypothermia protocol initiated.  7/27 CTA head >>> nad  7/27 Continuous EEG >>> no seizures  7/28 UDS positive benzodiazepine, negative for alcohol. 7/29 TTE >>> EF 25%, severe hypokinesis anteroseptal myocardium,  -Mitral valve: There was mild to moderate regurgitation 7/29 Transvaginal ultrasound > small intrauterine cyst, s/p R salpingo-oophrectomy, L ovary intact 7/30 Appropriate and interactive after rewarming, had 8-9 second run of VT  7/31 extubated 8/2 Off precedex  8/3 Failed SLP eval 8/4 MBS; passed; started Dysphagia 3 (Mechanical Soft);Thin liquid     Culture NA  Antibiotics: NA  DVT prophylaxis: SCD   Devices NA   LINES / TUBES:      Continuous Infusions: . sodium chloride 1,000 mL (02/20/14 0427)  . feeding supplement (JEVITY 1.2 CAL)      Objective: VITAL SIGNS: Temp: 99.1 F (37.3 C) (08/04 0758) Temp src: Oral (08/04 0758) BP: 129/76 mmHg (08/04 1120) Pulse Rate: 89 (08/04 0758) SPO2; FIO2:   Intake/Output Summary (Last 24 hours) at 02/20/14 1211 Last data filed at 02/20/14 0600  Gross per 24 hour  Intake 1462.58 ml  Output  0 ml  Net 1462.58 ml     Exam: General: A./O. x4, NAD, No acute respiratory distress Lungs: Clear to auscultation bilaterally without wheezes or  crackles Cardiovascular: Regular rate and rhythm without murmur gallop or rub normal S1 and S2 Abdomen: Nontender, nondistended, soft, bowel sounds positive, no rebound, no ascites, no appreciable mass Extremities: No significant cyanosis, clubbing, or edema bilateral lower extremities  Data Reviewed: Basic Metabolic Panel:  Recent Labs Lab 02/13/14 2253  02/16/14 2000 02/17/14 0550 02/17/14 0620 02/18/14 0445 02/19/14 0450 02/20/14 0353  NA 141  < > 141 141 142 141 145 143  K 3.1*  < > 3.5* 3.8 3.8 3.4* 3.4* 3.5*  CL 96  < > 107 106 106 104 108 107  CO2 18*  < > 24 22 23 20 21 19   GLUCOSE 272*  < > 110* 95 100* 86 91 71  BUN 5*  < > <3* 3* 3* 7 7 5*  CREATININE 0.47*  0.46*  < > 0.37* 0.33* 0.33* 0.44* 0.34* 0.36*  CALCIUM 7.2*  < > 7.3* 7.3* 7.6* 8.2* 8.3* 7.9*  MG 2.4  < > 2.2 1.8  --  1.6 1.7 1.5  PHOS 2.7  --   --   --   --   --  3.5  --   < > = values in this interval not displayed. Liver Function Tests:  Recent Labs Lab 02/13/14 2050 02/15/14 0805  AST 384* 64*  ALT 50* 17  ALKPHOS 171* 96  BILITOT 3.4* 0.9  PROT 7.1 4.8*  ALBUMIN 4.2 2.4*    Recent Labs Lab 02/13/14 2050  LIPASE 42    Recent Labs Lab 02/13/14 2118  AMMONIA 73*   CBC:  Recent Labs Lab 02/15/14 0445 02/16/14 0525 02/17/14 0550 02/17/14 0620 02/18/14 0445 02/19/14 0450  WBC 3.8* 3.6* 2.9* 2.9* 4.5 4.1  NEUTROABS 3.1 2.9  --   --   --   --   HGB 9.7* 8.1* 7.3* 7.1* 7.3* 7.7*  HCT 29.6* 25.7* 23.0* 22.1* 22.7* 24.5*  MCV 90.0 93.5 92.7 92.9 93.8 94.6  PLT 51* 50* 39* 38* 90* 169   Cardiac Enzymes: No results found for this basename: CKTOTAL, CKMB, CKMBINDEX, TROPONINI,  in the last 168 hours BNP (last 3 results)  Recent Labs  11/28/13 2059  PROBNP 39.4   CBG:  Recent Labs Lab 02/19/14 1626 02/19/14 2212 02/19/14 2351 02/20/14 0318 02/20/14 0800  GLUCAP 79 77 73 75 73    Recent Results (from the past 240 hour(s))  MRSA PCR SCREENING     Status: None    Collection Time    02/14/14 12:27 AM      Result Value Ref Range Status   MRSA by PCR NEGATIVE  NEGATIVE Final   Comment:            The GeneXpert MRSA Assay (FDA     approved for NASAL specimens     only), is one component of a     comprehensive MRSA colonization     surveillance program. It is not     intended to diagnose MRSA     infection nor to guide or     monitor treatment for     MRSA infections.     Studies:  Recent x-ray studies have been reviewed in detail by the Attending Physician  Scheduled Meds:  Scheduled Meds: . antiseptic oral rinse  7 mL Mouth Rinse BID  . carvedilol  3.125 mg Oral BID  WC  . chlorhexidine  15 mL Mouth Rinse BID  . folic acid  1 mg Intravenous Daily  . insulin aspart  0-15 Units Subcutaneous 6 times per day  . levETIRAcetam  1,000 mg Intravenous BID  . lisinopril  5 mg Oral Daily  . magnesium oxide  400 mg Oral BID  . magnesium sulfate 1 - 4 g bolus IVPB  2 g Intravenous Once  . potassium chloride  10 mEq Intravenous Q1 Hr x 4  . potassium chloride  20 mEq Oral Daily  . spironolactone  12.5 mg Oral Daily  . thiamine IV  100 mg Intravenous Daily    Time spent on care of this patient: 40 mins   Allie Bossier , MD   [facility] Office  [phone] Pager [phone]  On-Call/Text Page:      [email or web address]      password [email or web address]  If 7PM-7AM, please contact night-coverage [email or web address] Password [email or web address] 02/20/2014, 12:11 PM   LOS: 7 days

## 2014-02-20 NOTE — Progress Notes (Signed)
Speech Language Pathology Treatment: Dysphagia  Patient Details Name: Kristen Roberson MRN: [id number] DOB: 1976-01-10 Today's Date: 02/20/2014 Time: 2549-8264 SLP Time Calculation (min): 10 min  Assessment / Plan / Recommendation Clinical Impression  Pt showing mild improvement in vocal quality and function, particularly purees and puddings, but risk of silent aspiration still present. She has potential for a modified diet, and given attempts at panda placement have not been successful, will proceed with MBS to objectively evaluate function and hopefully initiate some POs today.    HPI HPI: 38 y.o. female with hx of alcohol abuse admitted to ED 7/27after having seizure at home. In ED, found to be hypomagnesemic, hypokalemic, hypocalcemic. 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 Intubated 7/27, extubated 7/31 (4 days). CXR 8/1 revealed R basilar opacity suspicious for PNA. Pt now lethargic and agitated, likely alcohol withdrawal- sedation. Bedside swallow eval ordered to evaluate swallow function post- extubation.   Pertinent Vitals NA  SLP Plan  Continue with current plan of care    Recommendations Diet recommendations: NPO Medication Administration: Via alternative means              General recommendations: Rehab consult Follow up Recommendations: Inpatient Rehab Plan: Continue with current plan of care    GO    [facility], [place] CCC-SLP [phone]  Kristen Roberson 02/20/2014, 8:50 AM

## 2014-02-20 NOTE — Progress Notes (Signed)
Subjective:  More alert this AM. C/O chest wall pain, no SOB  Objective:  Temp:  [98.5 F (36.9 C)-99.8 F (37.7 C)] 99.1 F (37.3 C) (08/04 0758) Pulse Rate:  [89] 89 (08/04 0758) Resp:  [26-34] 34 (08/03 2033) BP: (131-159)/(79-104) 133/82 mmHg (08/04 0700) SpO2:  [90 %-96 %] 95 % (08/04 0758) Weight:  [130 lb 1.1 oz (59 kg)] 130 lb 1.1 oz (59 kg) (08/04 0417) Weight change: 3 lb 12 oz (1.7 kg)  Intake/Output from previous day: 08/03 0701 - 08/04 0700 In: 1937.6 [I.V.:1587.6; IV Piggyback:350] Out: 125 [Urine:125]  Intake/Output from this shift:    Physical Exam: General appearance: alert and no distress Neck: no adenopathy, no carotid bruit, no JVD, supple, symmetrical, trachea midline and thyroid not enlarged, symmetric, no tenderness/mass/nodules Lungs: clear to auscultation bilaterally Heart: regular rate and rhythm, S1, S2 normal, no murmur, click, rub or gallop Abdomen: soft, non-tender; bowel sounds normal; no masses,  no organomegaly Pulses: 2+ and symmetric  Lab Results: Results for orders placed during the hospital encounter of 02/13/14 (from the past 48 hour(s))  GLUCOSE, CAPILLARY     Status: Abnormal   Collection Time    02/18/14  4:03 PM      Result Value Ref Range   Glucose-Capillary 117 (*) 70 - 99 mg/dL  GLUCOSE, CAPILLARY     Status: None   Collection Time    02/18/14 11:43 PM      Result Value Ref Range   Glucose-Capillary 86  70 - 99 mg/dL  BASIC METABOLIC PANEL     Status: Abnormal   Collection Time    02/19/14  4:50 AM      Result Value Ref Range   Sodium 145  137 - 147 mEq/L   Potassium 3.4 (*) 3.7 - 5.3 mEq/L   Chloride 108  96 - 112 mEq/L   CO2 21  19 - 32 mEq/L   Glucose, Bld 91  70 - 99 mg/dL   BUN 7  6 - 23 mg/dL   Creatinine, Ser 0.34 (*) 0.50 - 1.10 mg/dL   Calcium 8.3 (*) 8.4 - 10.5 mg/dL   GFR calc non Af Amer >90  >90 mL/min   GFR calc Af Amer >90  >90 mL/min   Comment: (NOTE)     The eGFR has been calculated using  the CKD EPI equation.     This calculation has not been validated in all clinical situations.     eGFR's persistently <90 mL/min signify possible Chronic Kidney     Disease.   Anion gap 16 (*) 5 - 15  MAGNESIUM     Status: None   Collection Time    02/19/14  4:50 AM      Result Value Ref Range   Magnesium 1.7  1.5 - 2.5 mg/dL  CBC     Status: Abnormal   Collection Time    02/19/14  4:50 AM      Result Value Ref Range   WBC 4.1  4.0 - 10.5 K/uL   RBC 2.59 (*) 3.87 - 5.11 MIL/uL   Hemoglobin 7.7 (*) 12.0 - 15.0 g/dL   HCT 24.5 (*) 36.0 - 46.0 %   MCV 94.6  78.0 - 100.0 fL   MCH 29.7  26.0 - 34.0 pg   MCHC 31.4  30.0 - 36.0 g/dL   RDW 19.5 (*) 11.5 - 15.5 %   Platelets 169  150 - 400 K/uL   Comment: DELTA CHECK  NOTED     REPEATED TO VERIFY  PHOSPHORUS     Status: None   Collection Time    02/19/14  4:50 AM      Result Value Ref Range   Phosphorus 3.5  2.3 - 4.6 mg/dL  GLUCOSE, CAPILLARY     Status: None   Collection Time    02/19/14  7:43 AM      Result Value Ref Range   Glucose-Capillary 83  70 - 99 mg/dL  GLUCOSE, CAPILLARY     Status: None   Collection Time    02/19/14  4:26 PM      Result Value Ref Range   Glucose-Capillary 79  70 - 99 mg/dL  GLUCOSE, CAPILLARY     Status: None   Collection Time    02/19/14 10:12 PM      Result Value Ref Range   Glucose-Capillary 77  70 - 99 mg/dL  GLUCOSE, CAPILLARY     Status: None   Collection Time    02/19/14 11:51 PM      Result Value Ref Range   Glucose-Capillary 73  70 - 99 mg/dL  GLUCOSE, CAPILLARY     Status: None   Collection Time    02/20/14  3:18 AM      Result Value Ref Range   Glucose-Capillary 75  70 - 99 mg/dL   Comment 1 Orig Pt Id entered as [id number]    BASIC METABOLIC PANEL     Status: Abnormal   Collection Time    02/20/14  3:53 AM      Result Value Ref Range   Sodium 143  137 - 147 mEq/L   Potassium 3.5 (*) 3.7 - 5.3 mEq/L   Chloride 107  96 - 112 mEq/L   CO2 19  19 - 32 mEq/L   Glucose, Bld 71   70 - 99 mg/dL   BUN 5 (*) 6 - 23 mg/dL   Creatinine, Ser 0.36 (*) 0.50 - 1.10 mg/dL   Calcium 7.9 (*) 8.4 - 10.5 mg/dL   GFR calc non Af Amer >90  >90 mL/min   GFR calc Af Amer >90  >90 mL/min   Comment: (NOTE)     The eGFR has been calculated using the CKD EPI equation.     This calculation has not been validated in all clinical situations.     eGFR's persistently <90 mL/min signify possible Chronic Kidney     Disease.   Anion gap 17 (*) 5 - 15  MAGNESIUM     Status: None   Collection Time    02/20/14  3:53 AM      Result Value Ref Range   Magnesium 1.5  1.5 - 2.5 mg/dL  GLUCOSE, CAPILLARY     Status: None   Collection Time    02/20/14  8:00 AM      Result Value Ref Range   Glucose-Capillary 73  70 - 99 mg/dL    Imaging: Imaging results have been reviewed  Assessment/Plan:   1. Active Problems: 2.   Cardiac arrest 3.   Convulsions/seizures 4. LV dysfunction  Time Spent Directly with Patient:  20 minutes  Length of Stay:  LOS: 7 days   1. VF arrest- Maintaining NSR. On approp meds for LVD including BB/ ACE-I and spironolactone. EF 20% with segmental WMA. Will need cath later in the week prior to D/C. Will also need to re check 2D prior to DC and if EF < 35 % consider LifeVest  2. Anemia- Hgb 7.7. No obvious GIB. Will put on PPI, check stool guiac. ? Hemodilution. If Hgb still low may benefit from Tx PRBCs.   3. Electrolyte Abn- K and Mg continue to be low. Will replete  4. QT prolongation- Last measured 477 msec. Will re check  Tx orders to Step down have already been written  Lorretta Harp 02/20/2014, 11:47 AM

## 2014-02-20 NOTE — Progress Notes (Signed)
Subjective: Patient awake and alert.  Still does not know where she is.  No agitation noted.  Remains on Keppra.    Objective: Current vital signs: BP 133/82  Pulse 89  Temp(Src) 99.1 F (37.3 C) (Oral)  Resp 34  Ht 5\' 6"  (1.676 m)  Wt 59 kg (130 lb 1.1 oz)  BMI 21.00 kg/m2  SpO2 95% Vital signs in last 24 hours: Temp:  [98.5 F (36.9 C)-99.8 F (37.7 C)] 99.1 F (37.3 C) (08/04 0758) Pulse Rate:  [89] 89 (08/04 0758) Resp:  [26-34] 34 (08/03 2033) BP: (131-159)/(76-104) 133/82 mmHg (08/04 0700) SpO2:  [90 %-96 %] 95 % (08/04 0758) Weight:  [59 kg (130 lb 1.1 oz)] 59 kg (130 lb 1.1 oz) (08/04 0417)  Intake/Output from previous day: 08/03 0701 - 08/04 0700 In: 1937.6 [I.V.:1587.6; IV Piggyback:350] Out: 125 [Urine:125] Intake/Output this shift:   Nutritional status: NPO  Neurologic Exam: Mental Status:  Awake. Alert. Follows commands. Does not know where she is but is clear that she is not at home.  Knows that it is August of 2015.  Speech fluent.  Cranial Nerves:  II: Discs flat bilaterally; Visual fields grossly normal, pupils equal, round, reactive to light and accommodation  III,IV, VI: ptosis not present, extra-ocular motions intact bilaterally  V,VII: smile symmetric, facial light touch sensation normal bilaterally  VIII: hearing normal bilaterally  IX,X: gag reflex present  XI: bilateral shoulder shrug  XII: midline tongue extension  Motor:  Moves all extremities against gravity equally and to command  Sensory: Sensation intact throughout, bilaterally  Plantars:  Right: downgoing   Left: downgoing     Lab Results: Basic Metabolic Panel:  Recent Labs Lab 02/13/14 2253  02/16/14 2000 02/17/14 0550 02/17/14 0620 02/18/14 0445 02/19/14 0450 02/20/14 0353  NA 141  < > 141 141 142 141 145 143  K 3.1*  < > 3.5* 3.8 3.8 3.4* 3.4* 3.5*  CL 96  < > 107 106 106 104 108 107  CO2 18*  < > 24 22 23 20 21 19   GLUCOSE 272*  < > 110* 95 100* 86 91 71  BUN 5*   < > <3* 3* 3* 7 7 5*  CREATININE 0.47*  0.46*  < > 0.37* 0.33* 0.33* 0.44* 0.34* 0.36*  CALCIUM 7.2*  < > 7.3* 7.3* 7.6* 8.2* 8.3* 7.9*  MG 2.4  < > 2.2 1.8  --  1.6 1.7 1.5  PHOS 2.7  --   --   --   --   --  3.5  --   < > = values in this interval not displayed.  Liver Function Tests:  Recent Labs Lab 02/13/14 2050 02/15/14 0805  AST 384* 64*  ALT 50* 17  ALKPHOS 171* 96  BILITOT 3.4* 0.9  PROT 7.1 4.8*  ALBUMIN 4.2 2.4*    Recent Labs Lab 02/13/14 2050  LIPASE 42    Recent Labs Lab 02/13/14 2118  AMMONIA 73*    CBC:  Recent Labs Lab 02/15/14 0445 02/16/14 0525 02/17/14 0550 02/17/14 0620 02/18/14 0445 02/19/14 0450  WBC 3.8* 3.6* 2.9* 2.9* 4.5 4.1  NEUTROABS 3.1 2.9  --   --   --   --   HGB 9.7* 8.1* 7.3* 7.1* 7.3* 7.7*  HCT 29.6* 25.7* 23.0* 22.1* 22.7* 24.5*  MCV 90.0 93.5 92.7 92.9 93.8 94.6  PLT 51* 50* 39* 38* 90* 169    Cardiac Enzymes: No results found for this basename: CKTOTAL, CKMB, CKMBINDEX, TROPONINI,  in the last 168 hours  Lipid Panel: No results found for this basename: CHOL, TRIG, HDL, CHOLHDL, VLDL, LDLCALC,  in the last 168 hours  CBG:  Recent Labs Lab 02/19/14 0743 02/19/14 1626 02/19/14 2212 02/19/14 2351 02/20/14 0800  GLUCAP 83 79 77 73 73    Microbiology: Results for orders placed during the hospital encounter of 02/13/14  MRSA PCR SCREENING     Status: None   Collection Time    02/14/14 12:27 AM      Result Value Ref Range Status   MRSA by PCR NEGATIVE  NEGATIVE Final   Comment:            The GeneXpert MRSA Assay (FDA     approved for NASAL specimens     only), is one component of a     comprehensive MRSA colonization     surveillance program. It is not     intended to diagnose MRSA     infection nor to guide or     monitor treatment for     MRSA infections.    Coagulation Studies: No results found for this basename: LABPROT, INR,  in the last 72 hours  Imaging: Dg Chest Port 1 View  02/19/2014    CLINICAL DATA:  Respiratory distress  EXAM: PORTABLE CHEST - 1 VIEW  COMPARISON:  02/17/2014  FINDINGS: Right IJ central line, tip at the lower SVC. Normal heart size and mediastinal contours for technique. Increasingly dense airspace disease in the lower lungs, right more than left. There may be small pleural effusions. No pulmonary edema. No pneumothorax.  IMPRESSION: Worsening bibasilar pneumonia.   Electronically Signed   By: Jorje Guild M.D.   On: 02/19/2014 05:21    Medications:  I have reviewed the patient's current medications. Scheduled: . antiseptic oral rinse  7 mL Mouth Rinse BID  . carvedilol  3.125 mg Oral BID WC  . chlorhexidine  15 mL Mouth Rinse BID  . folic acid  1 mg Intravenous Daily  . insulin aspart  0-15 Units Subcutaneous 6 times per day  . levETIRAcetam  1,000 mg Intravenous BID  . lisinopril  5 mg Oral Daily  . magnesium oxide  400 mg Oral BID  . spironolactone  12.5 mg Oral Daily  . thiamine IV  100 mg Intravenous Daily    Assessment/Plan: No further seizures noted.  On Keppra.  No side effects noted.  Due to strong history of alcoholism patient may not return to baseline mental status.  Last ammonia was elevated and at this time there may be some component of encephalopathy related to liver disease, that should improve as it clears.    Recommendations: 1.  Continue Keppra at current dose 2.  No further neurologic intervention is recommended at this time.  If further questions arise, please call or page at that time.  Thank you for allowing neurology to participate in the care of this patient.    LOS: 7 days   Alexis Goodell, MD [facility] [phone] 02/20/2014  9:44 AM

## 2014-02-20 NOTE — Procedures (Signed)
Objective Swallowing Evaluation: Modified Barium Swallowing Study  Patient Details  Name: Kristen Roberson MRN: [id number] Date of Birth: [birth year]  Today's Date: 02/20/2014 Time: 1015-1035 SLP Time Calculation (min): 20 min  Past Medical History:  Past Medical History  Diagnosis Date  . Proctitis   . Cysts of both ovaries   . Seizures   . Anemia   . Anxiety   . Blood transfusion without reported diagnosis   . Depression   . Fatty liver 10/05/13   Past Surgical History:  Past Surgical History  Procedure Laterality Date  . Ovarian cyst removal    . Laparoscopy N/A 09/28/2013    Procedure: LAPAROSCOPY OPERATIVE;  Surgeon: Terrance Mass, MD;  Location: [facility];  Service: Gynecology;  Laterality: N/A;  . Laparoscopic appendectomy Right 09/28/2013    Procedure: APPENDECTOMY LAPAROSCOPIC;  Surgeon: Terrance Mass, MD;  Location: [facility];  Service: Gynecology;  Laterality: Right;  . Salpingoophorectomy Right 09/28/2013    Procedure: SALPINGO OOPHORECTOMY;  Surgeon: Terrance Mass, MD;  Location: [facility];  Service: Gynecology;  Laterality: Right;  . Colonoscopy N/A 09/30/2013    Procedure: COLONOSCOPY;  Surgeon: Lafayette Dragon, MD;  Location: [facility];  Service: Endoscopy;  Laterality: N/A;  . Esophagogastroduodenoscopy N/A 11/23/2013    Procedure: ESOPHAGOGASTRODUODENOSCOPY (EGD);  Surgeon: Jerene Bears, MD;  Location: [facility];  Service: Endoscopy;  Laterality: N/A;  . Appendectomy     HPI:  38 y.o. female with hx of alcohol abuse admitted to ED 7/27after having seizure at home. In ED, found to be hypomagnesemic, hypokalemic, hypocalcemic. 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 Intubated 7/27, extubated 7/31 (4 days). CXR 8/1 revealed R basilar opacity suspicious for PNA. Pt now lethargic and agitated, likely alcohol withdrawal- sedation. Bedside swallow eval ordered to evaluate swallow function post-  extubation.     Assessment / Plan / Recommendation Clinical Impression  Dysphagia Diagnosis: Moderate pharyngeal phase dysphagia Clinical impression: Pt demonstrates a moderate oropharyngeal dysphagia characterized by decreased airway closure during the swallow, likely due to inflmmation of glottis or arytenoids follwing intubation (very hoarse vocal quality). The pt had silent aspiration during the swallow with small teaspoon nectar boluses and sensed, gross aspiration of thin liquids during the swallow with straw sips. When cued to tuck her chin the pt protects her airway during the swallow completely and can consume any texture with this method. Given that pt grimaces with regular solids, recommend she initiate a dys 3 (mechcanical soft diet) with thin liquids first. Pt will need full supervision for a chin tuck with all POs until she is reliable. SLP will f/u for tolerance and therapy. (give pills whole in puree).     Treatment Recommendation  Therapy as outlined in treatment plan below    Diet Recommendation Dysphagia 3 (Mechanical Soft);Thin liquid   Liquid Administration via: Straw Medication Administration: Whole meds with puree Supervision: Patient able to self feed;Full supervision/cueing for compensatory strategies Compensations: Slow rate;Small sips/bites Postural Changes and/or Swallow Maneuvers: Chin tuck;Seated upright 90 degrees;Out of bed for meals    Other  Recommendations Oral Care Recommendations: Oral care BID   Follow Up Recommendations  24 hour supervision/assistance    Frequency and Duration min 2x/week  2 weeks   Pertinent Vitals/Pain NA    SLP Swallow Goals     General HPI: 38 y.o. female with hx of alcohol abuse admitted to ED 7/27after having seizure at home. In  ED, found to be hypomagnesemic, hypokalemic, hypocalcemic. 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 Intubated 7/27,  extubated 7/31 (4 days). CXR 8/1 revealed R basilar opacity suspicious for PNA. Pt now lethargic and agitated, likely alcohol withdrawal- sedation. Bedside swallow eval ordered to evaluate swallow function post- extubation. Type of Study: Modified Barium Swallowing Study Reason for Referral: Objectively evaluate swallowing function Diet Prior to this Study: NPO Temperature Spikes Noted: No History of Recent Intubation: Yes Length of Intubations (days): 4 days Date extubated: 02/16/14 Behavior/Cognition: Alert;Cooperative;Requires cueing;Pleasant mood Oral Cavity - Dentition: Adequate natural dentition Oral Motor / Sensory Function: Within functional limits Self-Feeding Abilities: Able to feed self Patient Positioning: Upright in chair Baseline Vocal Quality: Breathy;Hoarse Volitional Cough: Weak;Congested Volitional Swallow: Able to elicit Anatomy: Within functional limits Pharyngeal Secretions: Not observed secondary MBS    Reason for Referral Objectively evaluate swallowing function   Oral Phase Oral Preparation/Oral Phase Oral Phase: WFL   Pharyngeal Phase Pharyngeal Phase Pharyngeal Phase: Impaired Pharyngeal - Nectar Pharyngeal - Nectar Teaspoon: Delayed swallow initiation;Penetration/Aspiration during swallow;Trace aspiration;Reduced airway/laryngeal closure Penetration/Aspiration details (nectar teaspoon): Material enters airway, passes BELOW cords without attempt by patient to eject out (silent aspiration) Pharyngeal - Nectar Straw: Delayed swallow initiation (chin tuck) Pharyngeal - Thin Pharyngeal - Thin Teaspoon: Delayed swallow initiation (chin tuck) Pharyngeal - Thin Straw: Delayed swallow initiation;Penetration/Aspiration during swallow;Reduced airway/laryngeal closure;Significant aspiration (Amount) (aspiration x1 without chin tuck) Penetration/Aspiration details (thin straw): Material enters airway, passes BELOW cords and not ejected out despite cough attempt by  patient Pharyngeal - Solids Pharyngeal - Puree: Delayed swallow initiation Pharyngeal - Regular: Delayed swallow initiation Pharyngeal - Pill: Delayed swallow initiation  Cervical Esophageal Phase    GO    Cervical Esophageal Phase Cervical Esophageal Phase: Impaired Cervical Esophageal Phase - Comment Cervical Esophageal Comment: pill lodged at cervical esophagus, tranited with f/u puree bolus        Herbie [place], MA CCC-SLP [phone]  Lynann Beaver 02/20/2014, 10:49 AM

## 2014-02-20 NOTE — Progress Notes (Signed)
Physical Therapy Treatment Patient Details Name: Kristen Roberson MRN: [id number] DOB: 02-28-1976 Today's Date: 02/20/2014    History of Present Illness Adm 02/13/14 s/p seizure and v-fib arrest in setting of alcohol withdrawal. Intubated 7/28-7/31. PMHx- alcohol abuse, anxiety    PT Comments    Pt progressing well towards physical therapy goals. Session limited for time by pt to be transported for swallow evaluation. Showed significant improvement in ambulation/balance. Minimal assist required occasionally throughout functional mobility. Will continue to follow and progress as able per POC.   Follow Up Recommendations  CIR;Supervision/Assistance - 24 hour     Equipment Recommendations  None recommended by PT    Recommendations for Other Services       Precautions / Restrictions Precautions Precautions: Fall Restrictions Weight Bearing Restrictions: No    Mobility  Bed Mobility Overal bed mobility: Needs Assistance Bed Mobility: Supine to Sit     Supine to sit: Supervision     General bed mobility comments: Pt very sleepy/lethargic at beginning of session but quickly became engaged when she initiated transfer to EOB. Supervision for safety but no physical assist required.   Transfers Overall transfer level: Needs assistance Equipment used: None Transfers: Sit to/from Stand Sit to Stand: Min guard         General transfer comment: Pt was able to power-up to full standing position with very minimal unsteadiness noted an no use of AD for support.   Ambulation/Gait Ambulation/Gait assistance: Min assist Ambulation Distance (Feet): 200 Feet Assistive device: None Gait Pattern/deviations: Step-through pattern;Decreased stride length;Staggering right Gait velocity: Decreased Gait velocity interpretation: Below normal speed for age/gender General Gait Details: Occasional min assist for unsteadiness, as pt tends to stagger to the right when taking turns to the left. Pt with  arms crossed or up trying to fix her hair while ambulating and overall balance was much improved from yesterday.    Stairs            Wheelchair Mobility    Modified Rankin (Stroke Patients Only)       Balance Overall balance assessment: Needs assistance Sitting-balance support: Feet supported;No upper extremity supported Sitting balance-Leahy Scale: Good     Standing balance support: No upper extremity supported;During functional activity Standing balance-Leahy Scale: Fair Standing balance comment: Continues to occasionally require assist for balance during dynamic standing activities.                     Cognition Arousal/Alertness: Lethargic Behavior During Therapy: Flat affect Overall Cognitive Status: Impaired/Different from baseline Area of Impairment: Safety/judgement         Safety/Judgement: Decreased awareness of safety          Exercises      General Comments        Pertinent Vitals/Pain Pt reports residual chest pain from CPR. RN present at end of session to administer meds.     Home Living                      Prior Function            PT Goals (current goals can now be found in the care plan section) Acute Rehab PT Goals Patient Stated Goal: uanble  PT Goal Formulation: With patient/family Time For Goal Achievement: 02/26/14 Potential to Achieve Goals: Good Progress towards PT goals: Progressing toward goals    Frequency  Min 4X/week    PT Plan Current plan remains appropriate    Co-evaluation  End of Session Equipment Utilized During Treatment: Gait belt Activity Tolerance: Patient tolerated treatment well Patient left: with call bell/phone within reach;with nursing/sitter in room;in chair     Time: 9191-6606 PT Time Calculation (min): 18 min  Charges:  $Gait Training: 8-22 mins                    G Codes:      Jolyn Lent 08-Mar-2014, 12:29 PM  Jolyn Lent, PT, DPT Acute  Rehabilitation Services Pager: [phone]

## 2014-02-21 ENCOUNTER — Encounter (HOSPITAL_COMMUNITY): Payer: Self-pay | Admitting: *Deleted

## 2014-02-21 DIAGNOSIS — E538 Deficiency of other specified B group vitamins: Secondary | ICD-10-CM

## 2014-02-21 DIAGNOSIS — E440 Moderate protein-calorie malnutrition: Secondary | ICD-10-CM | POA: Diagnosis present

## 2014-02-21 DIAGNOSIS — K7689 Other specified diseases of liver: Secondary | ICD-10-CM

## 2014-02-21 DIAGNOSIS — D538 Other specified nutritional anemias: Secondary | ICD-10-CM

## 2014-02-21 LAB — GLUCOSE, CAPILLARY: Glucose-Capillary: 91 mg/dL (ref 70–99)

## 2014-02-21 LAB — BASIC METABOLIC PANEL
Anion gap: 11 (ref 5–15)
CO2: 23 mEq/L (ref 19–32)
Calcium: 7.7 mg/dL — ABNORMAL LOW (ref 8.4–10.5)
Chloride: 105 mEq/L (ref 96–112)
Creatinine, Ser: 0.31 mg/dL — ABNORMAL LOW (ref 0.50–1.10)
GFR calc Af Amer: >90 mL/min (ref >90)
GLUCOSE: 121 mg/dL — AB (ref 70–99)
Potassium: 3.3 mEq/L — ABNORMAL LOW (ref 3.7–5.3)
Sodium: 139 mEq/L (ref 137–147)

## 2014-02-21 LAB — CBC
HCT: 22.8 % — ABNORMAL LOW (ref 36.0–46.0)
Hemoglobin: 7.4 g/dL — ABNORMAL LOW (ref 12.0–15.0)
MCH: 29.7 pg (ref 26.0–34.0)
MCHC: 32.5 g/dL (ref 30.0–36.0)
MCV: 91.6 fL (ref 78.0–100.0)
PLATELETS: 240 10*3/uL (ref 150–400)
RBC: 2.49 MIL/uL — ABNORMAL LOW (ref 3.87–5.11)
RDW: 19 % — ABNORMAL HIGH (ref 11.5–15.5)
WBC: 4.1 10*3/uL (ref 4.0–10.5)

## 2014-02-21 LAB — TRANSFERRIN: TRANSFERRIN: 102 mg/dL — AB (ref 200–360)

## 2014-02-21 LAB — IRON AND TIBC
Iron: 31 ug/dL — ABNORMAL LOW (ref 42–135)
Saturation Ratios: 24 % (ref 20–55)
TIBC: 130 ug/dL — AB (ref 250–470)
UIBC: 99 ug/dL — AB (ref 125–400)

## 2014-02-21 LAB — FERRITIN: Ferritin: 308 ng/mL — ABNORMAL HIGH (ref 10–291)

## 2014-02-21 LAB — MAGNESIUM: MAGNESIUM: 1.5 mg/dL (ref 1.5–2.5)

## 2014-02-21 MED ORDER — IBUPROFEN 600 MG PO TABS
600.0000 mg | ORAL_TABLET | Freq: Four times a day (QID) | ORAL | Status: DC | PRN
  Filled 2014-02-21 (×2): qty 1

## 2014-02-21 MED ORDER — POTASSIUM CHLORIDE CRYS ER 20 MEQ PO TBCR
40.0000 meq | EXTENDED_RELEASE_TABLET | Freq: Two times a day (BID) | ORAL | Status: AC
  Administered 2014-02-21 – 2014-02-22 (×3): 40 meq via ORAL
  Filled 2014-02-21: qty 4
  Filled 2014-02-21 (×2): qty 2

## 2014-02-21 MED ORDER — MAGNESIUM OXIDE 400 (241.3 MG) MG PO TABS
400.0000 mg | ORAL_TABLET | Freq: Three times a day (TID) | ORAL | Status: DC
  Administered 2014-02-21 – 2014-02-23 (×9): 400 mg via ORAL
  Filled 2014-02-21 (×12): qty 1

## 2014-02-21 MED ORDER — VITAMIN B-1 100 MG PO TABS
100.0000 mg | ORAL_TABLET | Freq: Every day | ORAL | Status: DC
  Administered 2014-02-22 – 2014-02-27 (×6): 100 mg via ORAL
  Filled 2014-02-21 (×6): qty 1

## 2014-02-21 MED ORDER — LEVETIRACETAM 500 MG PO TABS
1000.0000 mg | ORAL_TABLET | Freq: Two times a day (BID) | ORAL | Status: DC
  Administered 2014-02-21 – 2014-02-27 (×12): 1000 mg via ORAL
  Filled 2014-02-21 (×13): qty 2

## 2014-02-21 MED ORDER — FOLIC ACID 1 MG PO TABS
1.0000 mg | ORAL_TABLET | Freq: Every day | ORAL | Status: DC
  Administered 2014-02-22 – 2014-02-27 (×6): 1 mg via ORAL
  Filled 2014-02-21 (×6): qty 1

## 2014-02-21 MED ORDER — MAGNESIUM SULFATE 40 MG/ML IJ SOLN
2.0000 g | Freq: Once | INTRAMUSCULAR | Status: AC
  Administered 2014-02-21: 2 g via INTRAVENOUS
  Filled 2014-02-21: qty 50

## 2014-02-21 MED ORDER — HYDROCODONE-ACETAMINOPHEN 10-325 MG PO TABS
1.0000 | ORAL_TABLET | ORAL | Status: DC | PRN
  Administered 2014-02-21 – 2014-02-27 (×29): 1 via ORAL
  Filled 2014-02-21 (×29): qty 1

## 2014-02-21 MED ORDER — IBUPROFEN 800 MG PO TABS
800.0000 mg | ORAL_TABLET | Freq: Once | ORAL | Status: AC
  Administered 2014-02-22: 800 mg via ORAL
  Filled 2014-02-21 (×2): qty 1

## 2014-02-21 NOTE — Progress Notes (Signed)
Transferred to [facility] room 32 by whelelchair, stable,  Report given to RN. Belongings with pt.

## 2014-02-21 NOTE — Progress Notes (Signed)
Rehab admissions - I met with pt and her husband in follow up with rehab MD consult and explained that our rehab MD feels that pt is too high level for our program. We are recommending follow up home health or outpt support. Both pt/husband understood this recommendation and husband stated that he can be home with patient as needed. I spoke with Judson Roch, case manager as well about pt's case.   I will now sign off pt's case. Please call me with any questions. Thanks.  Nanetta Batty, PT Rehabilitation Admissions Coordinator [phone]

## 2014-02-21 NOTE — Progress Notes (Signed)
This RN and Lenell Antu, LCSW at [facility]), met with patient and friend at bedside.  Patient not yet established at [facility]; however, has had appointments in the past to establish care with Dr. Doreene Burke.  Discussed need to establish care with a PCP after follow-up, and the services that the clinic provides. Discussed hospital follow-up appointments with both patient and friend, and both patient and friend appreciative of information. Met with Almyra Free, Case Manager, and informed that clinic has hospital follow-up appointments available that patient would benefit from upon discharge.  Explained the process with Almyra Free who was also appreciative of information.

## 2014-02-21 NOTE — Progress Notes (Signed)
[facility] TEAM 1 - Stepdown/ICU TEAM Progress Note  Kristen Roberson FGH:[id number] DOB: February 15, 1976 DOA: 02/13/2014 PCP: Angelica Chessman, MD  Admit HPI / Brief Narrative: 38 y.o. F w/ a Hx alcohol abuse, substance abuse brought to ED by EMS on 7/27 after she began to have seizure like activity while watching TV at home. Boyfriend stated she began shaking and her eyes rolled back into her head. A few seconds later, she stopped breathing. He began CPR and dispatched EMS. In the field, she had a V.fib arrest with ROSC after epi x 1 and 1 shock.   In ED, she was found to be hypomagnesemic, hypokalemic, hypocalcemic. Neurology consulted due to concern that seizure like activity was in fact myoclonus. Continuous EEG was ordered to r/o status epilepticus. PCCM consulted for admission and initiation of hypothermia protocol.  Post admission Cardiology was consulted to r/o cardiogenic etiology to her pre admit arrest. EKG revealed prolonged Qtc and ECHO revealed cardiomyopathy with EF 25% with anteroseptal severe hypokinesis and inferobasal dyskinesis concerning for underlying ischemia. She had no further seizures after arrival. She was eventually rewarmed and extubated and transferred to the SDU.  HPI/Subjective: Alert and endorsing rib cage/sternal pain - no agina or SOB  Assessment/Plan:  Acute respiratory failure in setting of cardiac arrest / seizure -Resolved  VF arrest due to profound electrolyte abnormalities -Asymptomatic except for reproducible sternal chest pain caused by CPR -Continue to monitor closely  Acute systolic congestive heart failure/EF 25% / ?? CAD -Continue Coreg, lisinopril and spironolactone  -Cardiology plans for catheterization possibly on 8/8 -also Cards rec repeat ECHO before dc and if EF <35% consider Lifevest  Seizures -Continue Keppra 1000 mg BID per neurology -seems primarily r/t arrest preadmit but could have withdrawal component -appreciate Neuro  assistance  Acute encephalopathy / delirium, -seems resolved but follow -Ammonia was elevated after admission (78) -repeat level in event needs Lactulose or Rifaximin  Hypokalemia/Hypomagnesemia Cont prn KCL replacement -increase PO Mg to TID -give add'l IV bolus  -suspect underlying etiology due to ETOH use pre admit and associated poor nutrition  Dysphagia -Resolved -MBSS 8/4 rec D3 diet  Anemia, chronic -Monitor closely -anemia panel pending -anemia panel April 2015 with elevated iron and UIBC<15, folate 1.0 and ferritin 155 but suspect related to underlying liver disease   Alcohol abuse/substance abuse  -Negative signs of acute alcohol withdrawal -Continue folic acid 1 mg daily -DC'd Ativan / Fentanyl / Dilaudid 8/4  MS chest wall pain -from pre admit CPR -no rib fx's on CXR -Start Ibuprofen PRN   Depression -Restarted home Prozac 40 mg daily 8/4  Code Status: FULL Family Communication: no family present at time of exam Disposition Plan: Transfer to telemetry  Consultants: Dr. Donnella Bi (cardiology) Dr. Alexis Goodell (neurology) Dr. Dimple Casey (PCCM.)  Procedure/Significant Events: 7/27 Witnessed seizure by pt's fiance while watching TV at home, led to v.fib arrest in the field.  7/27 Intubated, hypothermia protocol initiated.  7/27 CTA head >>> nad  7/27 Continuous EEG >>> no seizures  7/28 UDS positive benzodiazepine, negative for alcohol. 7/29 TTE >>> EF 25%, severe hypokinesis anteroseptal myocardium,  -Mitral valve: There was mild to moderate regurgitation 7/29 Transvaginal ultrasound > small intrauterine cyst, s/p R salpingo-oophrectomy, L ovary intact 7/30 Appropriate and interactive after rewarming, had 8-9 second run of VT  7/31 extubated 8/2 Off precedex  8/3 Failed SLP eval 8/4 MBS; passed; started Dysphagia 3 (Mechanical Soft);Thin liquid   Antibiotics: NA  DVT prophylaxis: SCD  Objective:  VITAL SIGNS: Temp: 97.9  F (36.6 C) (08/05 1208) Temp src: Oral (08/05 1208) BP: 127/93 mmHg (08/05 1208) Pulse Rate: 81 (08/05 1208) SPO2; FIO2:  Intake/Output Summary (Last 24 hours) at 02/21/14 1246 Last data filed at 02/21/14 1200  Gross per 24 hour  Intake   2820 ml  Output    645 ml  Net   2175 ml   Exam: General: No acute respiratory distress Lungs: Clear to auscultation bilaterally without wheezes or crackles-chest wall tender-RA Cardiovascular: Regular rate and rhythm without murmur gallop or rub normal S1 and S2 Abdomen: Nontender, nondistended, soft, bowel sounds positive, no rebound, no ascites, no appreciable mass Extremities: No significant cyanosis, clubbing, or edema bilateral lower extremities  Data Reviewed: Basic Metabolic Panel:  Recent Labs Lab 02/17/14 0620 02/18/14 0445 02/19/14 0450 02/20/14 0353 02/20/14 1909 02/21/14 0400  NA 142 141 145 143  --  139  K 3.8 3.4* 3.4* 3.5* 3.7 3.3*  CL 106 104 108 107  --  105  CO2 23 20 21 19   --  23  GLUCOSE 100* 86 91 71  --  121*  BUN 3* 7 7 5*  --  <3*  CREATININE 0.33* 0.44* 0.34* 0.36*  --  0.31*  CALCIUM 7.6* 8.2* 8.3* 7.9*  --  7.7*  MG  --  1.6 1.7 1.5 1.6 1.5  PHOS  --   --  3.5  --   --   --    Liver Function Tests:  Recent Labs Lab 02/15/14 0805  AST 64*  ALT 17  ALKPHOS 96  BILITOT 0.9  PROT 4.8*  ALBUMIN 2.4*   No results found for this basename: LIPASE, AMYLASE,  in the last 168 hours No results found for this basename: AMMONIA,  in the last 168 hours CBC:  Recent Labs Lab 02/15/14 0445 02/16/14 0525 02/17/14 0550 02/17/14 0620 02/18/14 0445 02/19/14 0450 02/21/14 0400  WBC 3.8* 3.6* 2.9* 2.9* 4.5 4.1 4.1  NEUTROABS 3.1 2.9  --   --   --   --   --   HGB 9.7* 8.1* 7.3* 7.1* 7.3* 7.7* 7.4*  HCT 29.6* 25.7* 23.0* 22.1* 22.7* 24.5* 22.8*  MCV 90.0 93.5 92.7 92.9 93.8 94.6 91.6  PLT 51* 50* 39* 38* 90* 169 240   Cardiac Enzymes: No results found for this basename: CKTOTAL, CKMB, CKMBINDEX,  TROPONINI,  in the last 168 hours BNP (last 3 results)  Recent Labs  11/28/13 2059  PROBNP 39.4   CBG:  Recent Labs Lab 02/20/14 0318 02/20/14 0800 02/20/14 1211 02/20/14 2016 02/21/14 0803  GLUCAP 75 73 110* 106* 91    Recent Results (from the past 240 hour(s))  MRSA PCR SCREENING     Status: None   Collection Time    02/14/14 12:27 AM      Result Value Ref Range Status   MRSA by PCR NEGATIVE  NEGATIVE Final   Comment:            The GeneXpert MRSA Assay (FDA     approved for NASAL specimens     only), is one component of a     comprehensive MRSA colonization     surveillance program. It is not     intended to diagnose MRSA     infection nor to guide or     monitor treatment for     MRSA infections.     Studies:  Recent x-ray studies have been reviewed in detail by the Attending Physician  Scheduled Meds:  Scheduled Meds: . antiseptic oral rinse  7 mL Mouth Rinse BID  . carvedilol  3.125 mg Oral BID WC  . [START ON 10/20/5954] folic acid  1 mg Oral Daily  . levETIRAcetam  1,000 mg Intravenous BID  . lisinopril  5 mg Oral Daily  . magnesium oxide  400 mg Oral TID  . potassium chloride  40 mEq Oral BID  . spironolactone  12.5 mg Oral Daily  . [START ON 02/22/2014] thiamine  100 mg Oral Daily    Time spent on care of this patient: 35 mins   ELLIS,ALLISON L. , ANP  [facility] Office  [phone] Pager - [phone]  On-Call/Text Page:      [email or web address]      password [email or web address]  If 7PM-7AM, please contact night-coverage [email or web address] Password [email or web address] 02/21/2014, 12:46 PM   LOS: 8 days   I have personally examined this patient and reviewed the entire database. I have reviewed the above note, made any necessary editorial changes, and agree with its content.  Cherene Altes, MD [facility]

## 2014-02-21 NOTE — Progress Notes (Addendum)
Subjective:  Chest soreness. Feels that her fentanyl does not last long enough for pain.  Objective:  Temp:  [98 F (36.7 C)-98.6 F (37 C)] 98.3 F (36.8 C) (08/05 0802) Pulse Rate:  [81-94] 86 (08/05 0802) Resp:  [16-18] 16 (08/05 0802) BP: (114-147)/(60-99) 147/99 mmHg (08/05 0802) SpO2:  [85 %-97 %] 95 % (08/05 0802) FiO2 (%):  [2 %] 2 % (08/05 0800) Weight:  [131 lb 13.4 oz (59.8 kg)] 131 lb 13.4 oz (59.8 kg) (08/05 0413) Weight change: 1 lb 12.2 oz (0.8 kg)  Intake/Output from previous day: 08/04 0701 - 08/05 0700 In: 2760 [P.O.:480; I.V.:1980; IV Piggyback:300] Out: 525 [Urine:525]  Intake/Output from this shift: Total I/O In: 560 [I.V.:310; IV Piggyback:250] Out: 120 [Urine:120]  Physical Exam: General appearance: alert and no distress Neck: no adenopathy, no carotid bruit, no JVD, supple, symmetrical, trachea midline and thyroid not enlarged, symmetric, no tenderness/mass/nodules Lungs: clear to auscultation bilaterally Heart: regular rate and rhythm, S1, S2 normal, no murmur, click, rub or gallop Abdomen: soft, non-tender; bowel sounds normal; no masses,  no organomegaly Pulses: 2+ and symmetric  Lab Results: Results for orders placed during the hospital encounter of 02/13/14 (from the past 48 hour(s))  GLUCOSE, CAPILLARY     Status: None   Collection Time    02/19/14  4:26 PM      Result Value Ref Range   Glucose-Capillary 79  70 - 99 mg/dL  GLUCOSE, CAPILLARY     Status: None   Collection Time    02/19/14 10:12 PM      Result Value Ref Range   Glucose-Capillary 77  70 - 99 mg/dL  GLUCOSE, CAPILLARY     Status: None   Collection Time    02/19/14 11:51 PM      Result Value Ref Range   Glucose-Capillary 73  70 - 99 mg/dL  GLUCOSE, CAPILLARY     Status: None   Collection Time    02/20/14  3:18 AM      Result Value Ref Range   Glucose-Capillary 75  70 - 99 mg/dL   Comment 1 Orig Pt Id entered as [id number]    BASIC METABOLIC PANEL     Status:  Abnormal   Collection Time    02/20/14  3:53 AM      Result Value Ref Range   Sodium 143  137 - 147 mEq/L   Potassium 3.5 (*) 3.7 - 5.3 mEq/L   Chloride 107  96 - 112 mEq/L   CO2 19  19 - 32 mEq/L   Glucose, Bld 71  70 - 99 mg/dL   BUN 5 (*) 6 - 23 mg/dL   Creatinine, Ser 0.36 (*) 0.50 - 1.10 mg/dL   Calcium 7.9 (*) 8.4 - 10.5 mg/dL   GFR calc non Af Amer >90  >90 mL/min   GFR calc Af Amer >90  >90 mL/min   Comment: (NOTE)     The eGFR has been calculated using the CKD EPI equation.     This calculation has not been validated in all clinical situations.     eGFR's persistently <90 mL/min signify possible Chronic Kidney     Disease.   Anion gap 17 (*) 5 - 15  MAGNESIUM     Status: None   Collection Time    02/20/14  3:53 AM      Result Value Ref Range   Magnesium 1.5  1.5 - 2.5 mg/dL  GLUCOSE, CAPILLARY  Status: None   Collection Time    02/20/14  8:00 AM      Result Value Ref Range   Glucose-Capillary 73  70 - 99 mg/dL  GLUCOSE, CAPILLARY     Status: Abnormal   Collection Time    02/20/14 12:11 PM      Result Value Ref Range   Glucose-Capillary 110 (*) 70 - 99 mg/dL  POTASSIUM     Status: None   Collection Time    02/20/14  7:09 PM      Result Value Ref Range   Potassium 3.7  3.7 - 5.3 mEq/L  MAGNESIUM     Status: None   Collection Time    02/20/14  7:09 PM      Result Value Ref Range   Magnesium 1.6  1.5 - 2.5 mg/dL  GLUCOSE, CAPILLARY     Status: Abnormal   Collection Time    02/20/14  8:16 PM      Result Value Ref Range   Glucose-Capillary 106 (*) 70 - 99 mg/dL  BASIC METABOLIC PANEL     Status: Abnormal   Collection Time    02/21/14  4:00 AM      Result Value Ref Range   Sodium 139  137 - 147 mEq/L   Potassium 3.3 (*) 3.7 - 5.3 mEq/L   Chloride 105  96 - 112 mEq/L   CO2 23  19 - 32 mEq/L   Glucose, Bld 121 (*) 70 - 99 mg/dL   BUN <3 (*) 6 - 23 mg/dL   Creatinine, Ser 0.31 (*) 0.50 - 1.10 mg/dL   Calcium 7.7 (*) 8.4 - 10.5 mg/dL   GFR calc non Af  Amer >90  >90 mL/min   GFR calc Af Amer >90  >90 mL/min   Comment: (NOTE)     The eGFR has been calculated using the CKD EPI equation.     This calculation has not been validated in all clinical situations.     eGFR's persistently <90 mL/min signify possible Chronic Kidney     Disease.   Anion gap 11  5 - 15  CBC     Status: Abnormal   Collection Time    02/21/14  4:00 AM      Result Value Ref Range   WBC 4.1  4.0 - 10.5 K/uL   RBC 2.49 (*) 3.87 - 5.11 MIL/uL   Hemoglobin 7.4 (*) 12.0 - 15.0 g/dL   HCT 22.8 (*) 36.0 - 46.0 %   MCV 91.6  78.0 - 100.0 fL   MCH 29.7  26.0 - 34.0 pg   MCHC 32.5  30.0 - 36.0 g/dL   RDW 19.0 (*) 11.5 - 15.5 %   Platelets 240  150 - 400 K/uL   Comment: DELTA CHECK NOTED     REPEATED TO VERIFY     SPECIMEN CHECKED FOR CLOTS  MAGNESIUM     Status: None   Collection Time    02/21/14  4:00 AM      Result Value Ref Range   Magnesium 1.5  1.5 - 2.5 mg/dL  GLUCOSE, CAPILLARY     Status: None   Collection Time    02/21/14  8:03 AM      Result Value Ref Range   Glucose-Capillary 91  70 - 99 mg/dL    Imaging: Imaging results have been reviewed  Assessment/Plan:   Active Problems:   Cardiac arrest   Convulsions/seizures   Moderate malnutrition 1. Cardiomyopathy - EF 20%  Time Spent Directly with Patient:  20 minutes  Length of Stay:  LOS: 8 days   1. VF arrest- Maintaining NSR. On approp meds for LVD including BB/ ACE-I and spironolactone. EF 20% with segmental WMA. Will need cath possibly Friday. Will also need to re check 2D prior to DC and if EF < 35 % consider LifeVest  2. Anemia- Hgb 7.7. No obvious GIB. Guaiac negative. She has chronic anemia. Probably related to poor nutrition. She was previously on iron. Check iron studies.   3. Electrolyte Abn- K and Mg continue to be low. Now on magnesium oxide 400 mg TID. Check daily Mag/K  4. QT prolongation- appears to be improved. Avoid QT prolonging agents given probably Torsades event.  5.  Malnutrition - was receiving tube feeds. Ok for dysphagia 3 per SLP recommendations.   Agree with transfer to telemetry today.  Pixie Casino, MD, [facility] Attending Cardiologist [facility]   HILTY,Kenneth C 02/21/2014, 12:09 PM

## 2014-02-21 NOTE — Progress Notes (Signed)
NUTRITION FOLLOW UP Intervention:   Magic cup TID with meals, each supplement provides 290 kcal and 9 grams of protein  Nutrition Dx:   Inadequate oral intake related to pain as evidenced by po intake <50%, per pt report; ongoing.   Goal:   Pt will meet >90% of estimated nutritional needs - not met  Monitor:   Weights, labs, PO intake, supplement acceptance  Assessment:   Pt with hx of alcohol abuse admitted with VF arrest in setting of alcohol withdrawal.   Pt with low potassium which is being repleted with supplemental IV potassium. Pt also on folic acid and thiamine.  Pt out of room. Saw pt 8/3 to start NG feedings after failing swallow eval. Pt very much wanted to eat at the time. She now has started on Dysphagia III with Thin liquids, however intake has been limited by pain.   Height: Ht Readings from Last 1 Encounters:  02/16/14 5' 6"  (1.676 m)    Weight Status:   Wt Readings from Last 1 Encounters:  02/21/14 131 lb 13.4 oz (59.8 kg)  Admit wt         110 lb 0.2 oz (49.9 kg)   Pt positive 3 Liters  Re-estimated needs:  Kcal: 1600-1800 Protein: 75-90 grams Fluid: >1.6 L/day  Skin: WDL  BM: 8/5  Diet Order: Dysphagia III with Thin Liquids Meal Completion: 25-50%   Intake/Output Summary (Last 24 hours) at 02/21/14 1606 Last data filed at 02/21/14 1300  Gross per 24 hour  Intake   2730 ml  Output    645 ml  Net   2085 ml   Labs:   Recent Labs Lab 02/19/14 0450 02/20/14 0353 02/20/14 1909 02/21/14 0400  NA 145 143  --  139  K 3.4* 3.5* 3.7 3.3*  CL 108 107  --  105  CO2 21 19  --  23  BUN 7 5*  --  <3*  CREATININE 0.34* 0.36*  --  0.31*  CALCIUM 8.3* 7.9*  --  7.7*  MG 1.7 1.5 1.6 1.5  PHOS 3.5  --   --   --   GLUCOSE 91 71  --  121*    CBG (last 3)   Recent Labs  02/20/14 1211 02/20/14 2016 02/21/14 0803  GLUCAP 110* 106* 91    Scheduled Meds: . antiseptic oral rinse  7 mL Mouth Rinse BID  . carvedilol  3.125 mg Oral BID WC  .  [START ON 10/23/6597] folic acid  1 mg Oral Daily  . levETIRAcetam  1,000 mg Intravenous BID  . lisinopril  5 mg Oral Daily  . magnesium oxide  400 mg Oral TID  . potassium chloride  40 mEq Oral BID  . spironolactone  12.5 mg Oral Daily  . [START ON 02/22/2014] thiamine  100 mg Oral Daily    Continuous Infusions: . sodium chloride 10 mL/hr at 02/21/14 [place], [place], CNSC [phone] Pager [phone] After Hours Pager

## 2014-02-21 NOTE — Progress Notes (Signed)
Speech Language Pathology Treatment: Dysphagia  Patient Details Name: Kristen Roberson MRN: [id number] DOB: Apr 04, 1976 Today's Date: 02/21/2014 Time: 1093-2355 SLP Time Calculation (min): 8 min  Assessment / Plan / Recommendation Clinical Impression  Very limited session as pt refusing to consume breakfast or more than a few sips of ginger ale with SLP due to pain. SLP assisted pt to bathroom, pt noted to be impulsive and to have decreased safety awareness of IV pole. With PO trials, pt observed to attempt chin tuck, but not to fully tuck or keep her chin down throughout the swallow. With cues pt reluctantly return demonstrated x1. This pt is at high risk of aspiration if she is not given full supervision with thin liquids, as she was seen to have significant aspiration events if chin tuck not done. The pt has poor insight, awareness and memory meaning she requires assistance to follow through with this strategy. She reports she's eating and drinking just fine, though she was educated to deficits yesterday. SLP discussed this with RN and moved breakfast and drinks away from pts bedside to ensure increased supervision. Signs posted yesterday and precautions in diet order. Would not recommend for this pt to consider d/c home unless 24 hour supervision is available.    HPI HPI: 38 y.o. female with hx of alcohol abuse admitted to ED 7/27after having seizure at home. In ED, found to be hypomagnesemic, hypokalemic, hypocalcemic. 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 Intubated 7/27, extubated 7/31 (4 days). CXR 8/1 revealed R basilar opacity suspicious for PNA. Pt now lethargic and agitated, likely alcohol withdrawal- sedation. Bedside swallow eval ordered to evaluate swallow function post- extubation.   Pertinent Vitals NA  SLP Plan  Continue with current plan of care    Recommendations Diet recommendations: Dysphagia 3  (mechanical soft);Thin liquid Liquids provided via: Straw Medication Administration: Whole meds with puree Supervision: Patient able to self feed;Full supervision/cueing for compensatory strategies Compensations: Slow rate;Small sips/bites Postural Changes and/or Swallow Maneuvers: Chin tuck;Seated upright 90 degrees;Out of bed for meals              Oral Care Recommendations: Oral care BID Follow up Recommendations: 24 hour supervision/assistance Plan: Continue with current plan of care    GO    [facility], MA CCC-SLP [phone]  Lynann Beaver 02/21/2014, 9:10 AM

## 2014-02-21 NOTE — Care Management Note (Signed)
    Page 1 of 2   02/27/2014     4:15:10 PM CARE MANAGEMENT NOTE 02/27/2014  Patient:  Kristen Roberson, Kristen Roberson   Account Number:  [id number]  Date Initiated:  02/15/2014  Documentation initiated by:  [facility]  Subjective/Objective Assessment:   cardiac arrest - on vent     Action/Plan:   Lives with boyfriend.   Anticipated DC Date:  02/27/2014   Anticipated DC Plan:  [place]  CM consult  Medication Assistance  [facility]  Follow-up appt scheduled      Choice offered to / List presented to:             Status of service:  Completed, signed off Medicare Important Message given?   (If response is "NO", the following Medicare IM given date fields will be blank) Date Medicare IM given:   Medicare IM given by:   Date Additional Medicare IM given:   Additional Medicare IM given by:    Discharge Disposition:  HOME/SELF CARE  Per UR Regulation:  Reviewed for med. necessity/level of care/duration of stay  If discussed at [place] of Stay Meetings, dates discussed:   02/20/2014  02/22/2014  02/27/2014    Comments:  Contact:  Mortensen,Scott Friend [phone]  02/27/14 Ellan Lambert, RN, BSN [phone] Pt has progressed well; PT recommending no follow up at dc. Pt c/o of only being sore from chest compressions.  Pt agreeable to follow up at [facility].  Appt made for Tues, Aug 18th at 11:30, and appt info put on AVS.  Pt has no insurance, and states needs help with meds.  She is eligible for med assist through [place] letter given to pt with explanation of program benefits.  She is appreciative of help given.  02-21-14 3:30 Luz Lex, RNBSN [phone] Extubated now - doing well.  Moved to tele floor today. CIR consulted, but denied - too high level.  Talked with patient, lives at home with SO who can be with as much as needed.  States cannot afford HH but feels  will be fine without.  Does not have a PCP - [facility]  [facility].

## 2014-02-22 DIAGNOSIS — I5023 Acute on chronic systolic (congestive) heart failure: Secondary | ICD-10-CM

## 2014-02-22 DIAGNOSIS — J9601 Acute respiratory failure with hypoxia: Secondary | ICD-10-CM | POA: Diagnosis present

## 2014-02-22 DIAGNOSIS — F05 Delirium due to known physiological condition: Secondary | ICD-10-CM | POA: Diagnosis present

## 2014-02-22 DIAGNOSIS — G9340 Encephalopathy, unspecified: Secondary | ICD-10-CM | POA: Diagnosis present

## 2014-02-22 DIAGNOSIS — I5021 Acute systolic (congestive) heart failure: Secondary | ICD-10-CM | POA: Diagnosis present

## 2014-02-22 DIAGNOSIS — F1910 Other psychoactive substance abuse, uncomplicated: Secondary | ICD-10-CM | POA: Diagnosis present

## 2014-02-22 DIAGNOSIS — I4901 Ventricular fibrillation: Principal | ICD-10-CM | POA: Diagnosis present

## 2014-02-22 LAB — COMPREHENSIVE METABOLIC PANEL
ALBUMIN: 2.4 g/dL — AB (ref 3.5–5.2)
ALK PHOS: 110 U/L (ref 39–117)
ALT: 16 U/L (ref 0–35)
AST: 39 U/L — ABNORMAL HIGH (ref 0–37)
Anion gap: 9 (ref 5–15)
BUN: <3 mg/dL — ABNORMAL LOW (ref 6–23)
CO2: 26 mEq/L (ref 19–32)
Calcium: 8 mg/dL — ABNORMAL LOW (ref 8.4–10.5)
Chloride: 105 mEq/L (ref 96–112)
Creatinine, Ser: 0.34 mg/dL — ABNORMAL LOW (ref 0.50–1.10)
GFR calc non Af Amer: >90 mL/min (ref >90)
Glucose, Bld: 89 mg/dL (ref 70–99)
POTASSIUM: 4.7 meq/L (ref 3.7–5.3)
Sodium: 140 mEq/L (ref 137–147)
TOTAL PROTEIN: 5.2 g/dL — AB (ref 6.0–8.3)
Total Bilirubin: 0.5 mg/dL (ref 0.3–1.2)

## 2014-02-22 LAB — RETICULOCYTES
RBC.: 2.76 MIL/uL — ABNORMAL LOW (ref 3.87–5.11)
RETIC CT PCT: 6.5 % — AB (ref 0.4–3.1)
Retic Count, Absolute: 179.4 10*3/uL (ref 19.0–186.0)

## 2014-02-22 LAB — IRON AND TIBC
Iron: 52 ug/dL (ref 42–135)
SATURATION RATIOS: 35 % (ref 20–55)
TIBC: 149 ug/dL — AB (ref 250–470)
UIBC: 97 ug/dL — ABNORMAL LOW (ref 125–400)

## 2014-02-22 LAB — CBC
HEMATOCRIT: 24 % — AB (ref 36.0–46.0)
HEMOGLOBIN: 7.8 g/dL — AB (ref 12.0–15.0)
MCH: 29.8 pg (ref 26.0–34.0)
MCHC: 32.5 g/dL (ref 30.0–36.0)
MCV: 91.6 fL (ref 78.0–100.0)
Platelets: 364 10*3/uL (ref 150–400)
RBC: 2.62 MIL/uL — ABNORMAL LOW (ref 3.87–5.11)
RDW: 19.4 % — ABNORMAL HIGH (ref 11.5–15.5)
WBC: 3.9 10*3/uL — AB (ref 4.0–10.5)

## 2014-02-22 LAB — PHOSPHORUS: Phosphorus: 3.7 mg/dL (ref 2.3–4.6)

## 2014-02-22 LAB — AMMONIA: Ammonia: 25 umol/L (ref 11–60)

## 2014-02-22 LAB — FOLATE: Folate: 13.8 ng/mL

## 2014-02-22 LAB — VITAMIN B12: Vitamin B-12: 1353 pg/mL — ABNORMAL HIGH (ref 211–911)

## 2014-02-22 LAB — FERRITIN: FERRITIN: 271 ng/mL (ref 10–291)

## 2014-02-22 LAB — MAGNESIUM: MAGNESIUM: 1.7 mg/dL (ref 1.5–2.5)

## 2014-02-22 MED ORDER — SODIUM CHLORIDE 0.9 % IV SOLN
INTRAVENOUS | Status: DC
  Administered 2014-02-23: 06:00:00 via INTRAVENOUS

## 2014-02-22 MED ORDER — SODIUM CHLORIDE 0.9 % IV SOLN: 250.0000 mL | INTRAVENOUS | Status: DC | PRN

## 2014-02-22 MED ORDER — SODIUM CHLORIDE 0.9 % IJ SOLN: 3.0000 mL | INTRAMUSCULAR | Status: DC | PRN

## 2014-02-22 MED ORDER — ASPIRIN 81 MG PO CHEW
324.0000 mg | CHEWABLE_TABLET | ORAL | Status: AC
  Administered 2014-02-23: 324 mg via ORAL
  Filled 2014-02-22: qty 4

## 2014-02-22 MED ORDER — CARVEDILOL 6.25 MG PO TABS
6.2500 mg | ORAL_TABLET | Freq: Two times a day (BID) | ORAL | Status: DC
  Administered 2014-02-22: 18:00:00 via ORAL
  Administered 2014-02-23 – 2014-02-27 (×9): 6.25 mg via ORAL
  Filled 2014-02-22 (×12): qty 1

## 2014-02-22 MED ORDER — MAGNESIUM SULFATE 4000MG/100ML IJ SOLN
4.0000 g | Freq: Once | INTRAMUSCULAR | Status: AC
  Administered 2014-02-22: 12:00:00 via INTRAVENOUS
  Filled 2014-02-22: qty 100

## 2014-02-22 MED ORDER — SODIUM CHLORIDE 0.9 % IJ SOLN: 3.0000 mL | Freq: Two times a day (BID) | INTRAMUSCULAR | Status: DC

## 2014-02-22 MED ORDER — MAGNESIUM SULFATE 40 MG/ML IJ SOLN
2.0000 g | Freq: Once | INTRAMUSCULAR | Status: DC
  Filled 2014-02-22: qty 50

## 2014-02-22 NOTE — Progress Notes (Signed)
Physical Therapy Treatment Patient Details Name: Kristen Roberson MRN: [id number] DOB: July 15, 1976 Today's Date: 02/22/2014    History of Present Illness Adm 02/13/14 s/p seizure and v-fib arrest in setting of alcohol withdrawal. Intubated 7/28-7/31. PMHx- alcohol abuse, anxiety    PT Comments    Pt able to ambulate with IV pole with shakiness noted in B LE and pt reporting feeling weak.  Pt Hgb is decreased and pt reports she is getting blood tomorrow.  Sh needs continued strengthening and balance work to return to [employer].  Follow Up Recommendations  Home health PT;Supervision/Assistance - 24 hour     Equipment Recommendations  None recommended by PT    Recommendations for Other Services       Precautions / Restrictions Precautions Precautions: Fall    Mobility  Bed Mobility               General bed mobility comments: Pt up in bathroom upon arrival.  Transfers     Transfers: Sit to/from Stand Sit to Stand: Min guard         General transfer comment: Able to stand without AD.  Ambulation/Gait Ambulation/Gait assistance: Min guard;Min assist Ambulation Distance (Feet): 250 Feet Assistive device: 1 person hand held assist (pushing IV pole) Gait Pattern/deviations: Step-through pattern;Narrow base of support Gait velocity: Decreased   General Gait Details: Pt with shakiness noted in B LE during gait while pushing IV pole.  Near end of gait she had small LOB and finished last 20' of gait with HHA.   Stairs            Wheelchair Mobility    Modified Rankin (Stroke Patients Only)       Balance     Sitting balance-Leahy Scale: Good       Standing balance-Leahy Scale: Fair                      Cognition Arousal/Alertness: Awake/alert   Overall Cognitive Status: Impaired/Different from baseline Area of Impairment: Safety/judgement     Memory: Decreased short-term memory Following Commands: Follows one step commands  consistently Safety/Judgement: Decreased awareness of safety     General Comments: Pt forgetting how old she is thinking she was 54, then stated later when she said she was 39, oh I'm actually 28.  Pt is 38 y/o.    Exercises General Exercises - Lower Extremity Ankle Circles/Pumps: Strengthening;Both;10 reps;Seated Long Arc Quad: Strengthening;Both;10 reps;Seated Hip ABduction/ADduction: Strengthening;Both;10 reps;Seated Hip Flexion/Marching: Strengthening;Both;10 reps;Seated    General Comments        Pertinent Vitals/Pain Chest pain from compressions    Home Living                      Prior Function            PT Goals (current goals can now be found in the care plan section) Progress towards PT goals: Progressing toward goals    Frequency  Min 4X/week    PT Plan Discharge plan needs to be updated (per notes, CIR not accepting pt.)    Co-evaluation             End of Session Equipment Utilized During Treatment: Gait belt Activity Tolerance: Patient tolerated treatment well Patient left: in chair;with nursing/sitter in room;with call bell/phone within reach     Time: 1007-1032 PT Time Calculation (min): 25 min  Charges:  $Gait Training: 8-22 mins $Therapeutic Exercise: 8-22 mins  G Codes:      Dayan Desa LUBECK 02/22/2014, 11:00 AM

## 2014-02-22 NOTE — Progress Notes (Signed)
Subjective: + skeletal pain in chest with swallowing, with any movement.      Objective: Vital signs in last 24 hours: Temp:  [97.9 F (36.6 C)-98.5 F (36.9 C)] 98 F (36.7 C) (08/06 0426) Pulse Rate:  [77-89] 77 (08/06 0426) Resp:  [15-20] 18 (08/06 0426) BP: (103-127)/(68-93) 111/68 mmHg (08/06 0426) SpO2:  [94 %-99 %] 97 % (08/06 0426) Weight:  [129 lb 9.6 oz (58.786 kg)] 129 lb 9.6 oz (58.786 kg) (08/06 0426) Weight change: -2 lb 3.8 oz (-1.014 kg) Last BM Date: 02/21/14 Intake/Output from previous day: +940 08/05 0701 - 08/06 0700 In: 1060 [P.O.:480; I.V.:330; IV Piggyback:250] Out: 120 [Urine:120] Intake/Output this shift:    PE: General:Pleasant affect, NAD Skin:Warm and dry, brisk capillary refill HEENT:normocephalic, sclera clear, mucus membranes moist Neck:supple, no JVD, no bruits  Heart:S1S2 RRR without murmur, gallup, rub or click Lungs:clear to diminished, without rales, rhonchi, or wheezes YQI:HKVQ, non tender, + BS, do not palpate liver spleen or masses Ext:no lower ext edema, 2+ pedal pulses, 2+ radial pulses Neuro:alert and oriented, MAE, follows commands, + facial symmetry   Lab Results:  Recent Labs  02/21/14 0400 02/22/14 0535  WBC 4.1 3.9*  HGB 7.4* 7.8*  HCT 22.8* 24.0*  PLT 240 364   BMET  Recent Labs  02/21/14 0400 02/22/14 0535  NA 139 140  K 3.3* 4.7  CL 105 105  CO2 23 26  GLUCOSE 121* 89  BUN <3* <3*  CREATININE 0.31* 0.34*  CALCIUM 7.7* 8.0*   No results found for this basename: TROPONINI, CK, MB,  in the last 72 hours  No results found for this basename: CHOL, HDL, LDLCALC, LDLDIRECT, TRIG, CHOLHDL   Lab Results  Component Value Date   HGBA1C 4.4 12/09/2013     Lab Results  Component Value Date   TSH 3.990 01/09/2014    Hepatic Function Panel  Recent Labs  02/22/14 0535  PROT 5.2*  ALBUMIN 2.4*  AST 39*  ALT 16  ALKPHOS 110  BILITOT 0.5   No results found for this basename: CHOL,  in the  last 72 hours No results found for this basename: PROTIME,  in the last 72 hours     Studies/Results: ECHO:  - Left ventricle: The cavity size was normal. Wall thickness was normal. Systolic function was severely reduced. The estimated ejection fraction was in the range of 20% to 25%. There is severe hypokinesis of the entireanteroseptal myocardium. Left ventricular diastolic function parameters were normal. - Mitral valve: There was mild to moderate regurgitation   Dg Swallowing Func-speech Pathology  02/20/2014   Katherene Ponto Deblois, CCC-SLP     02/20/2014 10:51 AM Objective Swallowing Evaluation: Modified Barium Swallowing Study   Patient Details  Name: Kristen Roberson MRN: [id number] Date of Birth: 1976-06-01  Today's Date: 02/20/2014 Time: 1015-1035 SLP Time Calculation (min): 20 min  Past Medical History:  Past Medical History  Diagnosis Date  . Proctitis   . Cysts of both ovaries   . Seizures   . Anemia   . Anxiety   . Blood transfusion without reported diagnosis   . Depression   . Fatty liver 10/05/13   Past Surgical History:  Past Surgical History  Procedure Laterality Date  . Ovarian cyst removal    . Laparoscopy N/A 09/28/2013    Procedure: LAPAROSCOPY OPERATIVE;  Surgeon: Terrance Mass,  MD;  Location: [facility];  Service: Gynecology;  Laterality: N/A;  . Laparoscopic  appendectomy Right 09/28/2013    Procedure: APPENDECTOMY LAPAROSCOPIC;  Surgeon: Terrance Mass, MD;  Location: [facility];  Service: Gynecology;   Laterality: Right;  . Salpingoophorectomy Right 09/28/2013    Procedure: SALPINGO OOPHORECTOMY;  Surgeon: Terrance Mass,  MD;  Location: [facility];  Service: Gynecology;  Laterality: Right;  . Colonoscopy N/A 09/30/2013    Procedure: COLONOSCOPY;  Surgeon: Lafayette Dragon, MD;  Location:  [facility];  Service: Endoscopy;  Laterality: N/A;  . Esophagogastroduodenoscopy N/A 11/23/2013    Procedure: ESOPHAGOGASTRODUODENOSCOPY (EGD);  Surgeon: Jerene Bears, MD;  Location: [facility];  Service:  Endoscopy;   Laterality: N/A;  . Appendectomy     HPI:  38 y.o. female with hx of alcohol abuse admitted to ED 7/27after  having seizure at home. In ED, found to be hypomagnesemic,  hypokalemic, hypocalcemic. Neurology consulted, concern that  seizure like activity is in fact myoclonus, have ordered  continuous EEG to r/o status. Intubated 7/27, extubated 7/31 (4  days). CXR 8/1 revealed R basilar opacity suspicious for PNA. Pt  now lethargic and agitated, likely alcohol withdrawal- sedation.  Bedside swallow eval ordered to evaluate swallow function post-  extubation.     Assessment / Plan / Recommendation Clinical Impression  Dysphagia Diagnosis: Moderate pharyngeal phase dysphagia Clinical impression: Pt demonstrates a moderate oropharyngeal  dysphagia characterized by decreased airway closure during the  swallow, likely due to inflmmation of glottis or arytenoids  follwing intubation (very hoarse vocal quality). The pt had  silent aspiration during the swallow with small teaspoon nectar  boluses and sensed, gross aspiration of thin liquids during the  swallow with straw sips. When cued to tuck her chin the pt  protects her airway during the swallow completely and can consume  any texture with this method. Given that pt grimaces with regular  solids, recommend she initiate a dys 3 (mechcanical soft diet)  with thin liquids first. Pt will need full supervision for a chin  tuck with all POs until she is reliable. SLP will f/u for  tolerance and therapy. (give pills whole in puree).     Treatment Recommendation  Therapy as outlined in treatment plan below    Diet Recommendation Dysphagia 3 (Mechanical Soft);Thin liquid   Liquid Administration via: Straw Medication Administration: Whole meds with puree Supervision: Patient able to self feed;Full supervision/cueing  for compensatory strategies Compensations: Slow rate;Small sips/bites Postural Changes and/or Swallow Maneuvers: Chin tuck;Seated  upright 90 degrees;Out  of bed for meals    Other  Recommendations Oral Care Recommendations: Oral care BID   Follow Up Recommendations  24 hour supervision/assistance    Frequency and Duration min 2x/week  2 weeks   Pertinent Vitals/Pain NA    SLP Swallow Goals     General HPI: 38 y.o. female with hx of alcohol abuse admitted to  ED 7/27after having seizure at home. In ED, found to be  hypomagnesemic, hypokalemic, hypocalcemic. Neurology consulted,  concern that seizure like activity is in fact myoclonus, have  ordered continuous EEG to r/o status. Intubated 7/27, extubated  7/31 (4 days). CXR 8/1 revealed R basilar opacity suspicious for  PNA. Pt now lethargic and agitated, likely alcohol withdrawal-  sedation. Bedside swallow eval ordered to evaluate swallow  function post- extubation. Type of Study: Modified Barium Swallowing Study Reason for Referral: Objectively evaluate swallowing function Diet Prior to this Study: NPO Temperature Spikes Noted: No History of Recent Intubation: Yes Length of Intubations (days): 4 days Date extubated: 02/16/14  Behavior/Cognition: Alert;Cooperative;Requires cueing;Pleasant  mood Oral Cavity - Dentition: Adequate natural dentition Oral Motor / Sensory Function: Within functional limits Self-Feeding Abilities: Able to feed self Patient Positioning: Upright in chair Baseline Vocal Quality: Breathy;Hoarse Volitional Cough: Weak;Congested Volitional Swallow: Able to elicit Anatomy: Within functional limits Pharyngeal Secretions: Not observed secondary MBS    Reason for Referral Objectively evaluate swallowing function   Oral Phase Oral Preparation/Oral Phase Oral Phase: WFL   Pharyngeal Phase Pharyngeal Phase Pharyngeal Phase: Impaired Pharyngeal - Nectar Pharyngeal - Nectar Teaspoon: Delayed swallow  initiation;Penetration/Aspiration during swallow;Trace  aspiration;Reduced airway/laryngeal closure Penetration/Aspiration details (nectar teaspoon): Material enters  airway, passes BELOW cords without attempt  by patient to eject  out (silent aspiration) Pharyngeal - Nectar Straw: Delayed swallow initiation (chin tuck) Pharyngeal - Thin Pharyngeal - Thin Teaspoon: Delayed swallow initiation (chin  tuck) Pharyngeal - Thin Straw: Delayed swallow  initiation;Penetration/Aspiration during swallow;Reduced  airway/laryngeal closure;Significant aspiration (Amount)  (aspiration x1 without chin tuck) Penetration/Aspiration details (thin straw): Material enters  airway, passes BELOW cords and not ejected out despite cough  attempt by patient Pharyngeal - Solids Pharyngeal - Puree: Delayed swallow initiation Pharyngeal - Regular: Delayed swallow initiation Pharyngeal - Pill: Delayed swallow initiation  Cervical Esophageal Phase    GO    Cervical Esophageal Phase Cervical Esophageal Phase: Impaired Cervical Esophageal Phase - Comment Cervical Esophageal Comment: pill lodged at cervical esophagus,  tranited with f/u puree bolus        Herbie Baltimore, MA CCC-SLP [phone]  DeBlois, Katherene Ponto 02/20/2014, 10:49 AM     Medications: I have reviewed the patient's current medications. Scheduled Meds: . antiseptic oral rinse  7 mL Mouth Rinse BID  . carvedilol  3.125 mg Oral BID WC  . folic acid  1 mg Oral Daily  . levETIRAcetam  1,000 mg Oral BID  . lisinopril  5 mg Oral Daily  . magnesium oxide  400 mg Oral TID  . potassium chloride  40 mEq Oral BID  . spironolactone  12.5 mg Oral Daily  . thiamine  100 mg Oral Daily   Continuous Infusions: . sodium chloride 10 mL/hr at 02/21/14 1111   PRN Meds:.HYDROcodone-acetaminophen, ibuprofen  Assessment/Plan: Active Problems:   Cardiac arrest   Convulsions/seizures   Moderate malnutrition 38 year old female with past medical history significant for alcohol abuse and chronic pelvic pain of unknown etiology suffered a witnessed V. fib arrest while watching TV with her boyfriend. CPR was initiated by the boyfriend. EMS noted ventricular fibrillation and she was intubated. On  arrival, she was noted to have severe hypokalemia, hypomagnesemia, and hypocalcemia.  1. VF arrest- Maintaining NSR. Patient has had prolonged QT interval over multiple prior ECGs. She was hypokalemic, hypomagnesemic, and hypocalcemic on admission. Suspect torsades in the setting of electrolyte abnormalities and also use of trazodone and fluoxetine.   On approp meds for LVD including BB/ ACE-I and spironolactone. EF 20% with segmental WMA. Will need cath rt and Lt  possibly Friday. Will also need to re check 2D prior to DC and if EF < 35 % consider LifeVest -  If cath is normal, would lean towards cardiac MRI to assess for evidence of cardiac sarcoidosis.   2. Anemia- Hgb 7.8. No obvious GIB. Guaiac negative. She has chronic anemia. Probably related to poor nutrition. She was previously on iron. Check iron studies.   3. Electrolyte Abn- K and Mg continue to be low. Now on magnesium oxide 400 mg TID. Check daily Mag/K  Today K+ 4.7, mg-1.7  4.  QT prolongation- appears to be improved. Avoid QT prolonging agents given probably Torsades event.   5. Malnutrition - was receiving tube feeds. Ok for dysphagia 3 per SLP recommendations.   6. 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 LV not dilated and atria normal in size, suggesting relative new onset to the cardiomyopathy. It is possible this may be stunning or stress cardiomyopathy related to the arrest, but concern with the wall motion abnormalities.  7. ETOH abuse     LOS: 9 days   Time spent with pt. :15 minutes. [facility]  Nurse Practitioner Certified Pager [phone] or after 5pm and on weekends call [phone] 02/22/2014, 8:48 AM    Patient seen and examined. Agree with assessment and plan. Breathing bette. Still with significant chest wall discomfort. Continue Mg replacement to ~2.0. Discussed definitive cardiac cath; plan for tomorrow. Will further titrate  carvedilol to 6.25 mg bid today. Plan further med titration as BP and P allows.   Troy Sine, MD, [facility] 02/22/2014 9:41 AM

## 2014-02-22 NOTE — Progress Notes (Signed)
[facility] PROGRESS NOTE Interim History: 38 y.o. F w/ a Hx alcohol abuse, substance abuse brought to ED by EMS on 7/27 after she began to have seizure like activity while watching TV at home. Boyfriend stated she began shaking and her eyes rolled back into her head. A few seconds later, she stopped breathing. He began CPR and dispatched EMS. In the field, she had a V.fib arrest with ROSC after epi x 1 and 1 shock.  In ED, she was found to be hypomagnesemic, hypokalemic, hypocalcemic. Neurology consulted due to concern that seizure like activity was in fact myoclonus. Continuous EEG was ordered to r/o status epilepticus. PCCM consulted for admission and initiation of hypothermia protocol. EKG revealed prolonged Qtc and ECHO revealed cardiomyopathy with EF 25% with anteroseptal severe hypokinesis and inferobasal dyskinesis concerning for underlying ischemia. She had no further seizures after arrival. She was eventually rewarmed and extubated and transferred to the SDU.   Assessment/Plan: Acute respiratory failure with hypoxia due to Cardiac arrest/  *Ventricular fibrillation: - Resolved. Admitted to PCCM  Intubated-> extubated on 7.31.2015,VT due to severe electrolyte imbalance ? Torsade. - Asymptomatic except for reproducible sternal chest pain caused by CPR  - Now in SR on betablockers  Acute on chronic systolic heart failure - Cardiology on board.  - EF 20% with segmental WMA, on BB/ACE-I and aldactone. - Cardiac cath at some point. - If EF < 35 % consider LifeVest.  Hypokalemia/Hypomagnesemia: - Cr < 1.0 cont aldactone. - cont magnesium replacement. - check phos, try to keep Mag > 2.0, Phos 3.0, K > 4.0.  Substance abuse/  Severe alcohol use disorder: - + for BZD's.  Normocytic anemia: - MCV < 100, >80, RDW high. - check anemia panel ? Mix disorder iron def and B12 or folate def.  Severe alcohol use disorder: - thiamine and folate. - contributing to  hypomagnasemia.  Acute encephalopathy/ Acute confusional state - No resolved, amonia level was mildly high no asterix  Moderate malnutrition -  Convulsions/seizures -Continue Keppra at current dose   Code Status: full Family Communication: none  Disposition Plan: inpatient   Consultants:  PCCM  Cardiology  Procedures: 7/27 Intubated, hypothermia protocol initiated, extubated on 7.31.2015 7/27 CTA head >>> nad  7/27 Continuous EEG >>> no seizures  7/29 TTE >>> EF 25%, severe hypokinesis anteroseptal myocardium, mild MR  7/29 Transvaginal ultrasound > small intrauterine cyst, s/p R salpingo-oophrectomy, L ovary intact    Antibiotics:  None  HPI/Subjective: Pain reproducible by palpation  Objective: Filed Vitals:   02/21/14 1208 02/21/14 1425 02/21/14 1957 02/22/14 0426  BP: 127/93 103/73 115/77 111/68  Pulse: 81 89 82 77  Temp: 97.9 F (36.6 C) 98.3 F (36.8 C) 98.5 F (36.9 C) 98 F (36.7 C)  TempSrc: Oral Oral Oral Oral  Resp: 15 20 18 18   Height:      Weight:    58.786 kg (129 lb 9.6 oz)  SpO2: 94% 99% 98% 97%    Intake/Output Summary (Last 24 hours) at 02/22/14 0924 Last data filed at 02/21/14 1700  Gross per 24 hour  Intake    810 ml  Output      0 ml  Net    810 ml   Filed Weights   02/20/14 0417 02/21/14 0413 02/22/14 0426  Weight: 59 kg (130 lb 1.1 oz) 59.8 kg (131 lb 13.4 oz) 58.786 kg (129 lb 9.6 oz)    Exam:  General: Alert, awake, oriented x3, in no acute distress.  HEENT: No bruits, no goiter.  Heart: Regular rate and rhythm. Lungs: Good air movement, clear Abdomen: Soft, nontender, nondistended, positive bowel sounds.  Neuro: Grossly intact, nonfocal.   Data Reviewed: Basic Metabolic Panel:  Recent Labs Lab 02/18/14 0445 02/19/14 0450 02/20/14 0353 02/20/14 1909 02/21/14 0400 02/22/14 0535  NA 141 145 143  --  139 140  K 3.4* 3.4* 3.5* 3.7 3.3* 4.7  CL 104 108 107  --  105 105  CO2 20 21 19   --  23 26  GLUCOSE 86  91 71  --  121* 89  BUN 7 7 5*  --  <3* <3*  CREATININE 0.44* 0.34* 0.36*  --  0.31* 0.34*  CALCIUM 8.2* 8.3* 7.9*  --  7.7* 8.0*  MG 1.6 1.7 1.5 1.6 1.5 1.7  PHOS  --  3.5  --   --   --   --    Liver Function Tests:  Recent Labs Lab 02/22/14 0535  AST 39*  ALT 16  ALKPHOS 110  BILITOT 0.5  PROT 5.2*  ALBUMIN 2.4*   No results found for this basename: LIPASE, AMYLASE,  in the last 168 hours  Recent Labs Lab 02/22/14 0500  AMMONIA 25   CBC:  Recent Labs Lab 02/16/14 0525  02/17/14 0620 02/18/14 0445 02/19/14 0450 02/21/14 0400 02/22/14 0535  WBC 3.6*  < > 2.9* 4.5 4.1 4.1 3.9*  NEUTROABS 2.9  --   --   --   --   --   --   HGB 8.1*  < > 7.1* 7.3* 7.7* 7.4* 7.8*  HCT 25.7*  < > 22.1* 22.7* 24.5* 22.8* 24.0*  MCV 93.5  < > 92.9 93.8 94.6 91.6 91.6  PLT 50*  < > 38* 90* 169 240 364  < > = values in this interval not displayed. Cardiac Enzymes: No results found for this basename: CKTOTAL, CKMB, CKMBINDEX, TROPONINI,  in the last 168 hours BNP (last 3 results)  Recent Labs  11/28/13 2059  PROBNP 39.4   CBG:  Recent Labs Lab 02/20/14 0318 02/20/14 0800 02/20/14 1211 02/20/14 2016 02/21/14 0803  GLUCAP 75 73 110* 106* 91    Recent Results (from the past 240 hour(s))  MRSA PCR SCREENING     Status: None   Collection Time    02/14/14 12:27 AM      Result Value Ref Range Status   MRSA by PCR NEGATIVE  NEGATIVE Final   Comment:            The GeneXpert MRSA Assay (FDA     approved for NASAL specimens     only), is one component of a     comprehensive MRSA colonization     surveillance program. It is not     intended to diagnose MRSA     infection nor to guide or     monitor treatment for     MRSA infections.     Studies: Dg Swallowing Func-speech Pathology  02/20/2014   Katherene Ponto Deblois, CCC-SLP     02/20/2014 10:51 AM Objective Swallowing Evaluation: Modified Barium Swallowing Study   Patient Details  Name: Kristen Roberson: [id number] Date of  Birth: 07-24-1975  Today's Date: 02/20/2014 Time: 1015-1035 SLP Time Calculation (min): 20 min  Past Medical History:  Past Medical History  Diagnosis Date  . Proctitis   . Cysts of both ovaries   . Seizures   . Anemia   . Anxiety   . Blood transfusion  without reported diagnosis   . Depression   . Fatty liver 10/05/13   Past Surgical History:  Past Surgical History  Procedure Laterality Date  . Ovarian cyst removal    . Laparoscopy N/A 09/28/2013    Procedure: LAPAROSCOPY OPERATIVE;  Surgeon: Terrance Mass,  MD;  Location: [facility];  Service: Gynecology;  Laterality: N/A;  . Laparoscopic appendectomy Right 09/28/2013    Procedure: APPENDECTOMY LAPAROSCOPIC;  Surgeon: Terrance Mass, MD;  Location: [facility];  Service: Gynecology;   Laterality: Right;  . Salpingoophorectomy Right 09/28/2013    Procedure: SALPINGO OOPHORECTOMY;  Surgeon: Terrance Mass,  MD;  Location: [facility];  Service: Gynecology;  Laterality: Right;  . Colonoscopy N/A 09/30/2013    Procedure: COLONOSCOPY;  Surgeon: Lafayette Dragon, MD;  Location:  [facility];  Service: Endoscopy;  Laterality: N/A;  . Esophagogastroduodenoscopy N/A 11/23/2013    Procedure: ESOPHAGOGASTRODUODENOSCOPY (EGD);  Surgeon: Jerene Bears, MD;  Location: [facility];  Service: Endoscopy;   Laterality: N/A;  . Appendectomy     HPI:  38 y.o. female with hx of alcohol abuse admitted to ED 7/27after  having seizure at home. In ED, found to be hypomagnesemic,  hypokalemic, hypocalcemic. Neurology consulted, concern that  seizure like activity is in fact myoclonus, have ordered  continuous EEG to r/o status. Intubated 7/27, extubated 7/31 (4  days). CXR 8/1 revealed R basilar opacity suspicious for PNA. Pt  now lethargic and agitated, likely alcohol withdrawal- sedation.  Bedside swallow eval ordered to evaluate swallow function post-  extubation.     Assessment / Plan / Recommendation Clinical Impression  Dysphagia Diagnosis: Moderate pharyngeal phase dysphagia Clinical impression: Pt  demonstrates a moderate oropharyngeal  dysphagia characterized by decreased airway closure during the  swallow, likely due to inflmmation of glottis or arytenoids  follwing intubation (very hoarse vocal quality). The pt had  silent aspiration during the swallow with small teaspoon nectar  boluses and sensed, gross aspiration of thin liquids during the  swallow with straw sips. When cued to tuck her chin the pt  protects her airway during the swallow completely and can consume  any texture with this method. Given that pt grimaces with regular  solids, recommend she initiate a dys 3 (mechcanical soft diet)  with thin liquids first. Pt will need full supervision for a chin  tuck with all POs until she is reliable. SLP will f/u for  tolerance and therapy. (give pills whole in puree).     Treatment Recommendation  Therapy as outlined in treatment plan below    Diet Recommendation Dysphagia 3 (Mechanical Soft);Thin liquid   Liquid Administration via: Straw Medication Administration: Whole meds with puree Supervision: Patient able to self feed;Full supervision/cueing  for compensatory strategies Compensations: Slow rate;Small sips/bites Postural Changes and/or Swallow Maneuvers: Chin tuck;Seated  upright 90 degrees;Out of bed for meals    Other  Recommendations Oral Care Recommendations: Oral care BID   Follow Up Recommendations  24 hour supervision/assistance    Frequency and Duration min 2x/week  2 weeks   Pertinent Vitals/Pain NA    SLP Swallow Goals     General HPI: 38 y.o. female with hx of alcohol abuse admitted to  ED 7/27after having seizure at home. In ED, found to be  hypomagnesemic, hypokalemic, hypocalcemic. Neurology consulted,  concern that seizure like activity is in fact myoclonus, have  ordered continuous EEG to r/o status. Intubated 7/27, extubated  7/31 (4 days). CXR 8/1 revealed R basilar opacity  suspicious for  PNA. Pt now lethargic and agitated, likely alcohol withdrawal-  sedation. Bedside swallow  eval ordered to evaluate swallow  function post- extubation. Type of Study: Modified Barium Swallowing Study Reason for Referral: Objectively evaluate swallowing function Diet Prior to this Study: NPO Temperature Spikes Noted: No History of Recent Intubation: Yes Length of Intubations (days): 4 days Date extubated: 02/16/14 Behavior/Cognition: Alert;Cooperative;Requires cueing;Pleasant  mood Oral Cavity - Dentition: Adequate natural dentition Oral Motor / Sensory Function: Within functional limits Self-Feeding Abilities: Able to feed self Patient Positioning: Upright in chair Baseline Vocal Quality: Breathy;Hoarse Volitional Cough: Weak;Congested Volitional Swallow: Able to elicit Anatomy: Within functional limits Pharyngeal Secretions: Not observed secondary MBS    Reason for Referral Objectively evaluate swallowing function   Oral Phase Oral Preparation/Oral Phase Oral Phase: WFL   Pharyngeal Phase Pharyngeal Phase Pharyngeal Phase: Impaired Pharyngeal - Nectar Pharyngeal - Nectar Teaspoon: Delayed swallow  initiation;Penetration/Aspiration during swallow;Trace  aspiration;Reduced airway/laryngeal closure Penetration/Aspiration details (nectar teaspoon): Material enters  airway, passes BELOW cords without attempt by patient to eject  out (silent aspiration) Pharyngeal - Nectar Straw: Delayed swallow initiation (chin tuck) Pharyngeal - Thin Pharyngeal - Thin Teaspoon: Delayed swallow initiation (chin  tuck) Pharyngeal - Thin Straw: Delayed swallow  initiation;Penetration/Aspiration during swallow;Reduced  airway/laryngeal closure;Significant aspiration (Amount)  (aspiration x1 without chin tuck) Penetration/Aspiration details (thin straw): Material enters  airway, passes BELOW cords and not ejected out despite cough  attempt by patient Pharyngeal - Solids Pharyngeal - Puree: Delayed swallow initiation Pharyngeal - Regular: Delayed swallow initiation Pharyngeal - Pill: Delayed swallow initiation  Cervical Esophageal  Phase    GO    Cervical Esophageal Phase Cervical Esophageal Phase: Impaired Cervical Esophageal Phase - Comment Cervical Esophageal Comment: pill lodged at cervical esophagus,  tranited with f/u puree bolus        Herbie Baltimore, MA CCC-SLP [phone]  DeBlois, Katherene Ponto 02/20/2014, 10:49 AM     Scheduled Meds: . antiseptic oral rinse  7 mL Mouth Rinse BID  . carvedilol  3.125 mg Oral BID WC  . folic acid  1 mg Oral Daily  . levETIRAcetam  1,000 mg Oral BID  . lisinopril  5 mg Oral Daily  . magnesium oxide  400 mg Oral TID  . potassium chloride  40 mEq Oral BID  . spironolactone  12.5 mg Oral Daily  . thiamine  100 mg Oral Daily   Continuous Infusions: . sodium chloride 10 mL/hr at 02/21/14 [place], Elliott Lasecki  [facility] Pager [phone]. If 8PM-8AM, please contact night-coverage at [email or web address], password [email or web address] 02/22/2014, 9:24 AM  LOS: 9 days    **Disclaimer: This note may have been dictated with voice recognition software. Similar sounding words can inadvertently be transcribed and this note may contain transcription errors which may not have been corrected upon publication of note.**

## 2014-02-23 ENCOUNTER — Encounter (HOSPITAL_COMMUNITY)
Admission: EM | Disposition: A | Discharge status: Home / Self Care | Payer: Self-pay | Source: Home / Self Care | Attending: Pulmonary Disease

## 2014-02-23 DIAGNOSIS — I4581 Long QT syndrome: Secondary | ICD-10-CM

## 2014-02-23 HISTORY — PX: LEFT AND RIGHT HEART CATHETERIZATION WITH CORONARY ANGIOGRAM: SHX5449

## 2014-02-23 LAB — CBC
HCT: 26.9 % — ABNORMAL LOW (ref 36.0–46.0)
HEMATOCRIT: 23.7 % — AB (ref 36.0–46.0)
Hemoglobin: 8.4 g/dL — ABNORMAL LOW (ref 12.0–15.0)
Hemoglobin: 7.6 g/dL — ABNORMAL LOW (ref 12.0–15.0)
MCH: 29.1 pg (ref 26.0–34.0)
MCH: 29.6 pg (ref 26.0–34.0)
MCHC: 31.2 g/dL (ref 30.0–36.0)
MCHC: 32.1 g/dL (ref 30.0–36.0)
MCV: 93.1 fL (ref 78.0–100.0)
MCV: 92.2 fL (ref 78.0–100.0)
PLATELETS: 553 10*3/uL — AB (ref 150–400)
Platelets: 486 10*3/uL — ABNORMAL HIGH (ref 150–400)
RBC: 2.89 MIL/uL — ABNORMAL LOW (ref 3.87–5.11)
RBC: 2.57 MIL/uL — ABNORMAL LOW (ref 3.87–5.11)
RDW: 20 % — AB (ref 11.5–15.5)
RDW: 20.2 % — AB (ref 11.5–15.5)
WBC: 4 10*3/uL (ref 4.0–10.5)
WBC: 2.8 10*3/uL — ABNORMAL LOW (ref 4.0–10.5)

## 2014-02-23 LAB — POCT I-STAT 3, VENOUS BLOOD GAS (G3P V)
Acid-base deficit: 1 mmol/L (ref 0.0–2.0)
Bicarbonate: 25 mEq/L — ABNORMAL HIGH (ref 20.0–24.0)
O2 SAT: 65 %
TCO2: 26 mmol/L (ref 0–100)
pCO2, Ven: 45.5 mmHg (ref 45.0–50.0)
pH, Ven: 7.348 — ABNORMAL HIGH (ref 7.250–7.300)
pO2, Ven: 36 mmHg (ref 30.0–45.0)

## 2014-02-23 LAB — BASIC METABOLIC PANEL
ANION GAP: 12 (ref 5–15)
BUN: <3 mg/dL — ABNORMAL LOW (ref 6–23)
CALCIUM: 8.1 mg/dL — AB (ref 8.4–10.5)
CO2: 26 mEq/L (ref 19–32)
Chloride: 102 mEq/L (ref 96–112)
Creatinine, Ser: 0.38 mg/dL — ABNORMAL LOW (ref 0.50–1.10)
GFR calc Af Amer: >90 mL/min (ref >90)
Glucose, Bld: 83 mg/dL (ref 70–99)
Potassium: 4 mEq/L (ref 3.7–5.3)
Sodium: 140 mEq/L (ref 137–147)

## 2014-02-23 LAB — POCT I-STAT 3, ART BLOOD GAS (G3+)
Acid-Base Excess: 3 mmol/L — ABNORMAL HIGH (ref 0.0–2.0)
Bicarbonate: 28.2 mEq/L — ABNORMAL HIGH (ref 20.0–24.0)
O2 SAT: 94 %
PCO2 ART: 44.2 mmHg (ref 35.0–45.0)
TCO2: 30 mmol/L (ref 0–100)
pH, Arterial: 7.412 (ref 7.350–7.450)
pO2, Arterial: 73 mmHg — ABNORMAL LOW (ref 80.0–100.0)

## 2014-02-23 LAB — PROTIME-INR
INR: 1.09 (ref 0.00–1.49)
PROTHROMBIN TIME: 14.1 s (ref 11.6–15.2)

## 2014-02-23 LAB — MAGNESIUM: Magnesium: 1.7 mg/dL (ref 1.5–2.5)

## 2014-02-23 SURGERY — LEFT AND RIGHT HEART CATHETERIZATION WITH CORONARY ANGIOGRAM
Anesthesia: LOCAL

## 2014-02-23 MED ORDER — NITROGLYCERIN 1 MG/10 ML FOR IR/CATH LAB
INTRA_ARTERIAL | Status: AC
  Filled 2014-02-23: qty 10

## 2014-02-23 MED ORDER — MIDAZOLAM HCL 2 MG/2ML IJ SOLN
INTRAMUSCULAR | Status: AC
Start: 2014-02-23 — End: 2014-02-23
  Filled 2014-02-23: qty 2

## 2014-02-23 MED ORDER — HEPARIN SODIUM (PORCINE) 5000 UNIT/ML IJ SOLN
5000.0000 [IU] | Freq: Three times a day (TID) | INTRAMUSCULAR | Status: DC
  Administered 2014-02-23 – 2014-02-27 (×13): 5000 [IU] via SUBCUTANEOUS
  Filled 2014-02-23 (×13): qty 1

## 2014-02-23 MED ORDER — FENTANYL CITRATE 0.05 MG/ML IJ SOLN
INTRAMUSCULAR | Status: AC
  Filled 2014-02-23: qty 2

## 2014-02-23 MED ORDER — POLYSACCHARIDE IRON COMPLEX 150 MG PO CAPS
150.0000 mg | ORAL_CAPSULE | Freq: Every day | ORAL | Status: DC
  Administered 2014-02-23 – 2014-02-27 (×5): 150 mg via ORAL
  Filled 2014-02-23 (×5): qty 1

## 2014-02-23 MED ORDER — POTASSIUM CHLORIDE CRYS ER 20 MEQ PO TBCR: 40.0000 meq | EXTENDED_RELEASE_TABLET | Freq: Two times a day (BID) | ORAL | Status: DC

## 2014-02-23 MED ORDER — HEPARIN (PORCINE) IN NACL 2-0.9 UNIT/ML-% IJ SOLN
INTRAMUSCULAR | Status: AC
  Filled 2014-02-23: qty 1000

## 2014-02-23 MED ORDER — LIDOCAINE HCL (PF) 1 % IJ SOLN
INTRAMUSCULAR | Status: AC
  Filled 2014-02-23: qty 30

## 2014-02-23 MED ORDER — SODIUM CHLORIDE 0.9 % IV SOLN: 1.0000 mL/kg/h | INTRAVENOUS | Status: AC

## 2014-02-23 NOTE — Progress Notes (Signed)
Subjective:  No chesty pain or palpitations  Objective:   Vital Signs in the last 24 hours: Temp:  [98.4 F (36.9 C)-98.5 F (36.9 C)] 98.4 F (36.9 C) (08/07 0457) Pulse Rate:  [73-89] 73 (08/07 0457) Resp:  [18] 18 (08/07 0457) BP: (114-140)/(63-85) 140/85 mmHg (08/07 0457) SpO2:  [94 %-99 %] 97 % (08/07 0457) Weight:  [123 lb 11.2 oz (56.11 kg)] 123 lb 11.2 oz (56.11 kg) (08/07 0457)  Intake/Output from previous day: 08/06 0701 - 08/07 0700 In: 960 [P.O.:960] Out: -   Medications: . antiseptic oral rinse  7 mL Mouth Rinse BID  . carvedilol  6.25 mg Oral BID WC  . folic acid  1 mg Oral Daily  . levETIRAcetam  1,000 mg Oral BID  . lisinopril  5 mg Oral Daily  . magnesium oxide  400 mg Oral TID  . sodium chloride  3 mL Intravenous Q12H  . spironolactone  12.5 mg Oral Daily  . thiamine  100 mg Oral Daily    . sodium chloride 10 mL/hr at 02/21/14 1111  . sodium chloride 10 mL/hr at 02/23/14 0536    Physical Exam:   General appearance: alert, cooperative and no distress Neck: no adenopathy, no carotid bruit, no JVD, supple, symmetrical, trachea midline and thyroid not enlarged, symmetric, no tenderness/mass/nodules Lungs: clear to auscultation bilaterally Chest wall: Significant tenderness Heart: regular rate and rhythm Abdomen: soft, non-tender; bowel sounds normal; no masses,  no organomegaly Extremities: no edema, redness or tenderness in the calves or thighs Pulses: 2+ and symmetric Neurologic: Grossly normal   Rate: 80  Rhythm: normal sinus rhythm  Lab Results:   Recent Labs  02/21/14 0400 02/22/14 0535  NA 139 140  K 3.3* 4.7  CL 105 105  CO2 23 26  GLUCOSE 121* 89  BUN <3* <3*  CREATININE 0.31* 0.34*   CBC Latest Ref Rng 02/22/2014 02/21/2014 02/19/2014  WBC 4.0 - 10.5 K/uL 3.9(L) 4.1 4.1  Hemoglobin 12.0 - 15.0 g/dL 7.8(L) 7.4(L) 7.7(L)  Hematocrit 36.0 - 46.0 % 24.0(L) 22.8(L) 24.5(L)  Platelets 150 - 400 K/uL 364 240 169   MG 1.7  No  results found for this basename: TROPONINI, CK, MB,  in the last 72 hours  Hepatic Function Panel  Recent Labs  02/22/14 0535  PROT 5.2*  ALBUMIN 2.4*  AST 39*  ALT 16  ALKPHOS 110  BILITOT 0.5      Recent Labs  02/22/14 2357  INR 1.09   BNP (last 3 results)  Recent Labs  11/28/13 2059  PROBNP 39.4    Lipid Panel  No results found for this basename: chol, trig, hdl, cholhdl, vldl, ldlcalc      Imaging:  No results found.    Assessment/Plan:   Principal Problem:   Ventricular fibrillation Active Problems:   Adjustment disorder with depressed mood   Severe alcohol use disorder   Cardiac arrest   Convulsions/seizures   Moderate malnutrition   Substance abuse   Acute respiratory failure with hypoxia   Acute systolic heart failure - s/p VF Cardiac Arrest   Acute encephalopathy   Acute confusional state  1. VF arrest- Maintaining NSR. Patient has had prolonged QT interval over  prior ECGs. She was hypokalemic, hypomagnesemic, and hypocalcemic on admission. Suspect torsades in the setting of electrolyte abnormalities and also use of trazodone and fluoxetine.  On  BB/ ACE-I and spironolactone. EF 20% with segmental WMA. For  R &L heart today. Life-vest if EF <35%  If cath is normal, ? cardiac MRI to assess for evidence of cardiac sarcoidosis.  2. Anemia- Hgb 7.8. No obvious GIB. Guaiac negative. She has chronic anemia. Probably related to poor nutrition. She is on iron and folate.  3. Electrolyte Abn- K and Mg continue to be low. Now on magnesium oxide 400 mg TID. Check daily Mag/K Today K+ 4.7, mg-1.7  4. QT prolongation- appears to be improved. Avoid QT prolonging agents given probably Torsades event.  5. Malnutrition - was receiving tube feeds. Ok for dysphagia 3 per SLP recommendations.  6. 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 LV not dilated and atria normal in size, suggesting  relative new onset to the cardiomyopathy. It is possible this may be stunning or stress cardiomyopathy related to the arrest, but concern with the wall motion abnormalities. 7. ETOH abuse        Troy Sine, MD, [facility] 02/23/2014, 8:41 AM

## 2014-02-23 NOTE — Interval H&P Note (Signed)
History and Physical Interval Note:  02/23/2014 10:00 AM  Kristen Roberson  has presented today for surgery, with the diagnosis of Cardiac Arrest (VF), Cardiomyopathy.  The various methods of treatment have been discussed with the patient and family. After consideration of risks, benefits and other options for treatment, the patient has consented to  Procedure(s): LEFT AND RIGHT HEART CATHETERIZATION WITH CORONARY ANGIOGRAM (N/A) as a surgical intervention .  The patient's history has been reviewed, patient examined, no change in status, stable for surgery.  I have reviewed the patient's chart and labs.  Questions were answered to the patient's satisfaction.     HARDING,DAVID W  Cath Lab Visit (complete for each Cath Lab visit)  Clinical Evaluation Leading to the Procedure:   ACS: No. - Post Cardiac Arrest  Non-ACS:    Anginal Classification: No Symptoms  Anti-ischemic medical therapy: Maximal Therapy (2 or more classes of medications)  Non-Invasive Test Results: High-risk stress test findings: cardiac mortality >3%/year - EF ~20-25%  Prior CABG: No previous CABG

## 2014-02-23 NOTE — Progress Notes (Signed)
PT Cancellation Note  Patient Details Name: Kristen Roberson MRN: [id number] DOB: 03-16-1976   Cancelled Treatment:    Reason Eval/Treat Not Completed: Other (comment)Patient on bedrest. Will follow up on Monday. Patient encouraged to walk with staff as able   Mahlon Gabrielle, Tonia Brooms 02/23/2014, 12:41 PM

## 2014-02-23 NOTE — Progress Notes (Signed)
SLP Cancellation Note  Patient Details Name: JEANNENE TSCHETTER MRN: [id number] DOB: Dec 15, 1975   Cancelled treatment:       Reason Eval/Treat Not Completed: Other (comment) (pt npo currently, ? for procedure.  Will reattempt SLP tx another time)   Claudie Fisherman, [facility] SLP [phone]

## 2014-02-23 NOTE — Progress Notes (Signed)
[facility] PROGRESS NOTE Interim History: 38 y.o. F w/ a Hx alcohol abuse, substance abuse brought to ED by EMS on 7/27 after she began to have seizure like activity while watching TV at home. Boyfriend stated she began shaking and her eyes rolled back into her head. A few seconds later, she stopped breathing. He began CPR and dispatched EMS. In the field, she had a V.fib arrest with ROSC after epi x 1 and 1 shock.  In ED, she was found to be hypomagnesemic, hypokalemic, hypocalcemic. Neurology consulted due to concern that seizure like activity was in fact myoclonus. Continuous EEG was ordered to r/o status epilepticus. PCCM consulted for admission and initiation of hypothermia protocol. EKG revealed prolonged Qtc and ECHO revealed cardiomyopathy with EF 25% with anteroseptal severe hypokinesis and inferobasal dyskinesis concerning for underlying ischemia. She had no further seizures after arrival. She was eventually rewarmed and extubated and transferred to the SDU.   Assessment/Plan: Acute respiratory failure with hypoxia due to Cardiac arrest/  *Ventricular fibrillation: - Resolved. Admitted to PCCM  Intubated-> extubated on 7.31.2015,VT due to severe electrolyte imbalance ? Torsade. - Asymptomatic. - Now in SR on betablockers  Acute on chronic systolic heart failure - Cardiology on board.  - Echo on 7.29.2015 showed EF 20% with segmental WMA, on BB/ACE-I and aldactone. - Cardiac cath on 8.7.2015 showed EF to normal and normal coronaries - cardiology rec cardiac MRI to rule out sarcoid.  - She has had no events on telemetry.  Hypokalemia/Hypomagnesemia: - Cr < 1.0 cont aldactone. K 4.7. - cont magnesium replacement. - Phos 3.7, try to keep Mag > 2.0. mag level pending.  Substance abuse/  Severe alcohol use disorder: - + for BZD's.  Normocytic anemia: - MCV < 100, >80, RDW high. - Ferritin unreliable as is acting as an acute phase reactant. - ferritin high b12 high  folate 13.8. - start nu- iron.  Severe alcohol use disorder: - thiamine and folate. - contributing to hypomagnasemia.  Acute encephalopathy/ Acute confusional state - No resolved, amonia level was mildly high no asterix  Moderate malnutrition -  Convulsions/seizures -Continue Keppra at current dose   Code Status: full Family Communication: none  Disposition Plan: inpatient   Consultants:  PCCM  Cardiology  Procedures: 7/27 Intubated, hypothermia protocol initiated, extubated on 7.31.2015 7/27 CTA head >>> nad  7/27 Continuous EEG >>> no seizures  7/29 TTE >>> EF 25%, severe hypokinesis anteroseptal myocardium, mild MR  7/29 Transvaginal ultrasound > small intrauterine cyst, s/p R salpingo-oophrectomy, L ovary intact    Antibiotics:  None  HPI/Subjective: No complains  Objective: Filed Vitals:   02/23/14 0457 02/23/14 0847 02/23/14 1011 02/23/14 1113  BP: 140/85 134/86  120/80  Pulse: 73 86 72   Temp: 98.4 F (36.9 C)     TempSrc: Oral     Resp: 18   16  Height:      Weight: 56.11 kg (123 lb 11.2 oz)     SpO2: 97%   94%    Intake/Output Summary (Last 24 hours) at 02/23/14 1125 Last data filed at 02/23/14 0700  Gross per 24 hour  Intake    720 ml  Output      0 ml  Net    720 ml   Filed Weights   02/21/14 0413 02/22/14 0426 02/23/14 0457  Weight: 59.8 kg (131 lb 13.4 oz) 58.786 kg (129 lb 9.6 oz) 56.11 kg (123 lb 11.2 oz)    Exam:  General: Alert, awake,  oriented x3, in no acute distress.  HEENT: No bruits, no goiter.  Heart: Regular rate and rhythm.pain reproducible by palpation. Lungs: Good air movement, clear Abdomen: Soft, nontender, nondistended, positive bowel sounds.     Data Reviewed: Basic Metabolic Panel:  Recent Labs Lab 02/18/14 0445 02/19/14 0450 02/20/14 0353 02/20/14 1909 02/21/14 0400 02/22/14 0535 02/22/14 1105  NA 141 145 143  --  139 140  --   K 3.4* 3.4* 3.5* 3.7 3.3* 4.7  --   CL 104 108 107  --  105 105   --   CO2 20 21 19   --  23 26  --   GLUCOSE 86 91 71  --  121* 89  --   BUN 7 7 5*  --  <3* <3*  --   CREATININE 0.44* 0.34* 0.36*  --  0.31* 0.34*  --   CALCIUM 8.2* 8.3* 7.9*  --  7.7* 8.0*  --   MG 1.6 1.7 1.5 1.6 1.5 1.7  --   PHOS  --  3.5  --   --   --   --  3.7   Liver Function Tests:  Recent Labs Lab 02/22/14 0535  AST 39*  ALT 16  ALKPHOS 110  BILITOT 0.5  PROT 5.2*  ALBUMIN 2.4*   No results found for this basename: LIPASE, AMYLASE,  in the last 168 hours  Recent Labs Lab 02/22/14 0500  AMMONIA 25   CBC:  Recent Labs Lab 02/18/14 0445 02/19/14 0450 02/21/14 0400 02/22/14 0535 02/23/14 0839  WBC 4.5 4.1 4.1 3.9* 4.0  HGB 7.3* 7.7* 7.4* 7.8* 8.4*  HCT 22.7* 24.5* 22.8* 24.0* 26.9*  MCV 93.8 94.6 91.6 91.6 93.1  PLT 90* 169 240 364 553*   Cardiac Enzymes: No results found for this basename: CKTOTAL, CKMB, CKMBINDEX, TROPONINI,  in the last 168 hours BNP (last 3 results)  Recent Labs  11/28/13 2059  PROBNP 39.4   CBG:  Recent Labs Lab 02/20/14 0318 02/20/14 0800 02/20/14 1211 02/20/14 2016 02/21/14 0803  GLUCAP 75 73 110* 106* 91    Recent Results (from the past 240 hour(s))  MRSA PCR SCREENING     Status: None   Collection Time    02/14/14 12:27 AM      Result Value Ref Range Status   MRSA by PCR NEGATIVE  NEGATIVE Final   Comment:            The GeneXpert MRSA Assay (FDA     approved for NASAL specimens     only), is one component of a     comprehensive MRSA colonization     surveillance program. It is not     intended to diagnose MRSA     infection nor to guide or     monitor treatment for     MRSA infections.     Studies: No results found.  Scheduled Meds: . antiseptic oral rinse  7 mL Mouth Rinse BID  . carvedilol  6.25 mg Oral BID WC  . folic acid  1 mg Oral Daily  . levETIRAcetam  1,000 mg Oral BID  . lisinopril  5 mg Oral Daily  . magnesium oxide  400 mg Oral TID  . sodium chloride  3 mL Intravenous Q12H  .  spironolactone  12.5 mg Oral Daily  . thiamine  100 mg Oral Daily   Continuous Infusions: . sodium chloride 10 mL/hr at 02/21/14 1111  . sodium chloride 10 mL/hr at 02/23/14 0536  .  sodium chloride 1 mL/kg/hr (02/23/14 1120)     Charlynne Cousins  [facility] Pager [phone]. If 8PM-8AM, please contact night-coverage at [email or web address], password [facility] 02/23/2014, 11:25 AM  LOS: 10 days    **Disclaimer: This note may have been dictated with voice recognition software. Similar sounding words can inadvertently be transcribed and this note may contain transcription errors which may not have been corrected upon publication of note.**

## 2014-02-23 NOTE — Progress Notes (Signed)
Site area: rt femoral venous and arterial sheath Site Prior to Removal:  Level 0 Pressure Applied For: 20 Manual:   yes Patient Status During Pull:  stable Post Pull Site:  Level 0 Post Pull Instructions Given:  yes Post Pull Pulses Present: yes Dressing Applied:  tegaderm Bedrest begins @ 1045 Comments: no complications

## 2014-02-23 NOTE — Progress Notes (Signed)
Pt alert & oriented x4. Vss. Up to BR with standby assist. Pain med given x2 this shift. Pt down for cardiac cath this shift, EF normal. PIV started, central line dc'd per MD order. Pt's boyfriend at bedside. 02/23/2014 6:56 PM  Sherrie Mustache

## 2014-02-23 NOTE — CV Procedure (Signed)
RIGHT AND LEFT CARDIAC CATHETERIZATION REPORT  NAME:  GERLENE GLASSBURN   MRN: [id number] DOB:  April 21, 1976   ADMIT DATE: 02/13/2014 Procedure Date: 02/23/2014  INTERVENTIONAL CARDIOLOGIST: Leonie Man, M.D., MS PRIMARY CARE PROVIDER: Angelica Chessman, MD PRIMARY CARDIOLOGIST: Sanda Klein, MD  PATIENT:  Kristen Roberson is a 38 y.o. female with past history significant for alcohol abuse and chronic pelvic pain who suffered a witnessed V. fib arrest while watching television with her boyfriend. The boyfriend initiated CPR and EMS continued CPR for 10 minutes with oral intubation with restoration of perfusing rhythm. She has been found to have significant cardiomyopathy (EF 20-25% with anteroseptal hypokinesis) by echocardiography.  He is now stabilized, extubated in ambulating without difficulty. She is referred for evaluation of possible ischemic etiology. I have asked perform a Right and Left Heart Catheterization to evaluate her intracardiac pressures and coronary anatomy.  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  Left Ventriculography  PROCEDURE: The patient was brought to the 2nd [place] [facility] in the fasting state and prepped and draped in the usual sterile fashion for right femoral arterial or radial access. Based on her persistent anemia with concern for ongoing menstrual cycle, the decision was made to proceed with femoral access was radial axis to avoid heparin administration the. Sterile technique was used including antiseptics, cap, gloves, gown, hand hygiene, mask and sheet. Skin prep: Chlorhexidine.   Consent: Risks of procedure as well as the alternatives and risks of each were explained to the (patient/caregiver). Consent for  procedure obtained.   Time Out: Verified patient identification, verified procedure, site/side was marked, verified correct patient position, special equipment/implants available, medications/allergies/relevent history reviewed, required imaging and test results available. Performed.  Access:  Right Common Femoral Artery: 5 Fr Sheath - fluoroscopically guided modified Seldinger Technique  Right Common Femoral Vein: 7 Fr Sheath - Seldinger Technique..  Right Heart Catheterization: 7 Fr Gordy Councilman catheter advanced under fluoroscopy with balloon inflated to the RA, RV, then PCWP-PA for hemodynamic measurement.  Simultaneous FA & PA blood gases checked for SaO2% to calculate FICK CO/CI  Thermodilution Injections performed to calculate CO/CI  Simultaneous PCWP/LV & RV/LV pressures monitored with Angled Pigtail in LV.  Catheter removed completely out of the body with balloon deflated.  Left Heart Catheterization: 5Fr Catheters advanced or exchanged over a J-wire; Angled Pigtail catheter advanced first.  LV Hemodynamics (LV Gram): Angled pigtail catheter Left Coronary Artery Cineangiography: JL4 Catheter  Right Coronary Artery Cineangiography: JR 4 Catheter   Sheath removed in the holding area with manual pressure for hemostasis.   FINDINGS:  Hemodynamics:   SaO2%  Pressures mmHg  Mean P  mmHg  EDP  mmHg   Right Atrium    4/4    2   Right Ventricle    24/5    6   Pulmonary Artery  65   22/11   16    PCWP    10/10   8    Central Aortic   94%   103/65  85    Left Ventricle    12/6    10          Cardiac Output:   Cardiac Index:    Fick   7.22    4.32  Thermodilution   5.01    3     Left Ventriculography:  EF: 50-60 %  Wall Motion: Mild distal anterior hypokinesis  Coronary Anatomy: Right dominant  Left Main: Large-caliber, short vessel that bifurcates into the LAD and Circumflex LAD: Large-caliber tortuous vessel gives off a mid vessel diagonal branch. The LAD which reaches  down around the apex it is in the upper apex. Angiographically normal LAD and diagonal  Left Circumflex: Large-caliber nondominant vessel that courses as a lateral OM with several branches. Tortuous but no angiographically significant disease   RCA: Large-caliber dominant vessel that bifurcates distally into the Right Posterior descending Artery and the Right Posterior AV Groove / Posterolateral Branch. Angiographically Normal  MEDICATIONS:  Anesthesia:  Local Lidocaine 14 ml  Sedation:   3 mg IV Versed, 75 mcg IV fentanyl ;   Omnipaque Contrast: 70 ml  PATIENT DISPOSITION:    The patient was transferred to the PACU holding area in a hemodynamicaly stable, chest pain free condition.  The patient tolerated the procedure well, and there were no complications.  EBL:   < 10 ml  The patient was stable before, during, and after the procedure.  POST-OPERATIVE DIAGNOSIS:    Angiographically normal Coronary Arteries with nearly fully recovered EF of ~55% and normal Right and Left Heart Pressures  Low Normal Cardiac Output and Index  PLAN OF CARE:  Return to nursing unit after sheath removal for standard post catheterization care.  Would continue current regimen for now, she appears to be euvolemic to mildly hypovolemic.  No further ischemic evaluation needed from a cardiac standpoint.   Leonie Man, M.D., M.S. [facility] [address] [place], [place]  [id number]  [phone]  02/23/2014

## 2014-02-23 NOTE — H&P (View-Only) (Signed)
Subjective:  No chesty pain or palpitations  Objective:   Vital Signs in the last 24 hours: Temp:  [98.4 F (36.9 C)-98.5 F (36.9 C)] 98.4 F (36.9 C) (08/07 0457) Pulse Rate:  [73-89] 73 (08/07 0457) Resp:  [18] 18 (08/07 0457) BP: (114-140)/(63-85) 140/85 mmHg (08/07 0457) SpO2:  [94 %-99 %] 97 % (08/07 0457) Weight:  [123 lb 11.2 oz (56.11 kg)] 123 lb 11.2 oz (56.11 kg) (08/07 0457)  Intake/Output from previous day: 08/06 0701 - 08/07 0700 In: 960 [P.O.:960] Out: -   Medications: . antiseptic oral rinse  7 mL Mouth Rinse BID  . carvedilol  6.25 mg Oral BID WC  . folic acid  1 mg Oral Daily  . levETIRAcetam  1,000 mg Oral BID  . lisinopril  5 mg Oral Daily  . magnesium oxide  400 mg Oral TID  . sodium chloride  3 mL Intravenous Q12H  . spironolactone  12.5 mg Oral Daily  . thiamine  100 mg Oral Daily    . sodium chloride 10 mL/hr at 02/21/14 1111  . sodium chloride 10 mL/hr at 02/23/14 0536    Physical Exam:   General appearance: alert, cooperative and no distress Neck: no adenopathy, no carotid bruit, no JVD, supple, symmetrical, trachea midline and thyroid not enlarged, symmetric, no tenderness/mass/nodules Lungs: clear to auscultation bilaterally Chest wall: Significant tenderness Heart: regular rate and rhythm Abdomen: soft, non-tender; bowel sounds normal; no masses,  no organomegaly Extremities: no edema, redness or tenderness in the calves or thighs Pulses: 2+ and symmetric Neurologic: Grossly normal   Rate: 80  Rhythm: normal sinus rhythm  Lab Results:   Recent Labs  02/21/14 0400 02/22/14 0535  NA 139 140  K 3.3* 4.7  CL 105 105  CO2 23 26  GLUCOSE 121* 89  BUN <3* <3*  CREATININE 0.31* 0.34*   CBC Latest Ref Rng 02/22/2014 02/21/2014 02/19/2014  WBC 4.0 - 10.5 K/uL 3.9(L) 4.1 4.1  Hemoglobin 12.0 - 15.0 g/dL 7.8(L) 7.4(L) 7.7(L)  Hematocrit 36.0 - 46.0 % 24.0(L) 22.8(L) 24.5(L)  Platelets 150 - 400 K/uL 364 240 169   MG 1.7  No  results found for this basename: TROPONINI, CK, MB,  in the last 72 hours  Hepatic Function Panel  Recent Labs  02/22/14 0535  PROT 5.2*  ALBUMIN 2.4*  AST 39*  ALT 16  ALKPHOS 110  BILITOT 0.5      Recent Labs  02/22/14 2357  INR 1.09   BNP (last 3 results)  Recent Labs  11/28/13 2059  PROBNP 39.4    Lipid Panel  No results found for this basename: chol, trig, hdl, cholhdl, vldl, ldlcalc      Imaging:  No results found.    Assessment/Plan:   Principal Problem:   Ventricular fibrillation Active Problems:   Adjustment disorder with depressed mood   Severe alcohol use disorder   Cardiac arrest   Convulsions/seizures   Moderate malnutrition   Substance abuse   Acute respiratory failure with hypoxia   Acute systolic heart failure - s/p VF Cardiac Arrest   Acute encephalopathy   Acute confusional state  1. VF arrest- Maintaining NSR. Patient has had prolonged QT interval over  prior ECGs. She was hypokalemic, hypomagnesemic, and hypocalcemic on admission. Suspect torsades in the setting of electrolyte abnormalities and also use of trazodone and fluoxetine.  On  BB/ ACE-I and spironolactone. EF 20% with segmental WMA. For  R &L heart today. Life-vest if EF <35%  If cath is normal, ? cardiac MRI to assess for evidence of cardiac sarcoidosis.  2. Anemia- Hgb 7.8. No obvious GIB. Guaiac negative. She has chronic anemia. Probably related to poor nutrition. She is on iron and folate.  3. Electrolyte Abn- K and Mg continue to be low. Now on magnesium oxide 400 mg TID. Check daily Mag/K Today K+ 4.7, mg-1.7  4. QT prolongation- appears to be improved. Avoid QT prolonging agents given probably Torsades event.  5. Malnutrition - was receiving tube feeds. Ok for dysphagia 3 per SLP recommendations.  6. 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 LV not dilated and atria normal in size, suggesting  relative new onset to the cardiomyopathy. It is possible this may be stunning or stress cardiomyopathy related to the arrest, but concern with the wall motion abnormalities. 7. ETOH abuse        Troy Sine, MD, [facility] 02/23/2014, 8:41 AM

## 2014-02-24 DIAGNOSIS — I5043 Acute on chronic combined systolic (congestive) and diastolic (congestive) heart failure: Secondary | ICD-10-CM

## 2014-02-24 DIAGNOSIS — E440 Moderate protein-calorie malnutrition: Secondary | ICD-10-CM

## 2014-02-24 MED ORDER — MAGNESIUM OXIDE 400 (241.3 MG) MG PO TABS
800.0000 mg | ORAL_TABLET | Freq: Three times a day (TID) | ORAL | Status: DC
  Administered 2014-02-24 – 2014-02-27 (×11): 800 mg via ORAL
  Filled 2014-02-24 (×12): qty 2

## 2014-02-24 MED ORDER — MAGNESIUM SULFATE 4000MG/100ML IJ SOLN
4.0000 g | Freq: Once | INTRAMUSCULAR | Status: AC
  Administered 2014-02-24: 4 g via INTRAVENOUS
  Filled 2014-02-24: qty 100

## 2014-02-24 NOTE — Progress Notes (Signed)
    SUBJECTIVE:  Chest is sore.     PHYSICAL EXAM Filed Vitals:   02/23/14 1833 02/23/14 2033 02/24/14 0449 02/24/14 0500  BP: 124/74 112/69 123/80   Pulse: 92 89 81   Temp:  98.6 F (37 C) 98.2 F (36.8 C)   TempSrc:  Oral Oral   Resp: 18 18 17    Height:      Weight:    121 lb 0.5 oz (54.9 kg)  SpO2: 99% 98% 94%    General:  No acute distress Lungs:  Decreased breath sounds Heart:  RRR Abdomen:  Positive bowel sounds, no rebound no guarding Extremities:  Right femoral tender but no pulsatile mass and only small hematoma.    LABS: Lab Results  Component Value Date   TROPONINI <0.30 12/22/2013   No results found for this or any previous visit (from the past 24 hour(s)).  Intake/Output Summary (Last 24 hours) at 02/24/14 1330 Last data filed at 02/24/14 0700  Gross per 24 hour  Intake    360 ml  Output      0 ml  Net    360 ml    ASSESSMENT AND PLAN:  VFIB ARREST:  Question related to severe electrolyte abnormalities on admission.  Normal coronaries on admission.    QT was prolonged and I will repeat this.   EF was low post arrest but normal on cath.  Cardiac MRI has been suggested and this would need to be arranged on Monday.    Jeneen Rinks [place] 02/24/2014 1:30 PM

## 2014-02-24 NOTE — Progress Notes (Signed)
[facility] PROGRESS NOTE Interim History: 38 y.o. F w/ a Hx alcohol abuse, substance abuse brought to ED by EMS on 7/27 after she began to have seizure like activity while watching TV at home. Boyfriend stated she began shaking and her eyes rolled back into her head. A few seconds later, she stopped breathing. He began CPR and dispatched EMS. In the field, she had a V.fib arrest with ROSC after epi x 1 and 1 shock.  In ED, she was found to be hypomagnesemic, hypokalemic, hypocalcemic. Neurology consulted due to concern that seizure like activity was in fact myoclonus. Continuous EEG was ordered to r/o status epilepticus. PCCM consulted for admission and initiation of hypothermia protocol. EKG revealed prolonged Qtc and ECHO revealed cardiomyopathy with EF 25% with anteroseptal severe hypokinesis and inferobasal dyskinesis concerning for underlying ischemia. She had no further seizures after arrival. She was eventually rewarmed and extubated and transferred to the SDU.   Assessment/Plan: Acute respiratory failure with hypoxia due to Cardiac arrest/  *Ventricular fibrillation: - Out of hospital CPR. Witness seizure by fiance. - Resolved. Admitted to PCCM  Intubated-> extubated on 7.31.2015,started on the arctic sun protocol on admission. - treated with precedex and off on 8.2.2015 - VT due to severe electrolyte imbalance ? Torsade. - Cont to be Asymptomatic. - Now in SR on betablockers  Acute on chronic systolic heart failure - Appreciate cardiology assistance. - Echo on 7.29.2015 showed EF 20% with segmental WMA, on BB/ACE-I and aldactone. - Cardiac cath on 8.7.2015 showed EF to normal and normal coronaries - Cardiology rec cardiac MRI to rule out sarcoid.  - -JVD, no lower ext edema. - She has had no events on telemetry.  Hypokalemia/Hypomagnesemia: - Cr < 1.0 cont aldactone. K >4.0 - cont magnesium replacement. - Phos 3.7, try to keep Mag > 2.0. mag level < 2.0  Substance  abuse/  Severe alcohol use disorder: - + for BZD's. - treated with precedex. - thiamine and folate. - ? contributing to hypomagnasemia.  Normocytic anemia: - MCV < 100, >80, RDW high. - Ferritin unreliable as is acting as an acute phase reactant. - ferritin high b12 high folate 13.8. - start nu- iron.  Acute encephalopathy/ Acute confusional state - No resolved, amonia level was mildly high no asterix  Moderate malnutrition -  Convulsions/seizures -Continue Keppra at current dose   Code Status: full Family Communication: none  Disposition Plan: inpatient   Consultants:  PCCM  Cardiology  Procedures: 7/27 Intubated, hypothermia protocol initiated, extubated on 7.31.2015 7/27 CTA head >>> nad  7/27 Continuous EEG >>> no seizures  7/29 TTE >>> EF 25%, severe hypokinesis anteroseptal myocardium, mild MR  7/29 Transvaginal ultrasound > small intrauterine cyst, s/p R salpingo-oophrectomy, L ovary intact    Antibiotics:  None  HPI/Subjective: No complains, wants to go home.  Objective: Filed Vitals:   02/23/14 1833 02/23/14 2033 02/24/14 0449 02/24/14 0500  BP: 124/74 112/69 123/80   Pulse: 92 89 81   Temp:  98.6 F (37 C) 98.2 F (36.8 C)   TempSrc:  Oral Oral   Resp: 18 18 17    Height:      Weight:    54.9 kg (121 lb 0.5 oz)  SpO2: 99% 98% 94%     Intake/Output Summary (Last 24 hours) at 02/24/14 0720 Last data filed at 02/23/14 1700  Gross per 24 hour  Intake    240 ml  Output      0 ml  Net  240 ml   Filed Weights   02/22/14 0426 02/23/14 0457 02/24/14 0500  Weight: 58.786 kg (129 lb 9.6 oz) 56.11 kg (123 lb 11.2 oz) 54.9 kg (121 lb 0.5 oz)    Exam:  General: Alert, awake, oriented x3, in no acute distress.  HEENT: No bruits, no goiter. -JVD Heart: Regular rate and rhythm.pain reproducible by palpation. No lower ext edema. Lungs: Good air movement, clear Abdomen: Soft, nontender, nondistended, positive bowel sounds.     Data  Reviewed: Basic Metabolic Panel:  Recent Labs Lab 02/19/14 0450 02/20/14 0353 02/20/14 1909 02/21/14 0400 02/22/14 0535 02/22/14 1105 02/23/14 1315  NA 145 143  --  139 140  --  140  K 3.4* 3.5* 3.7 3.3* 4.7  --  4.0  CL 108 107  --  105 105  --  102  CO2 21 19  --  23 26  --  26  GLUCOSE 91 71  --  121* 89  --  83  BUN 7 5*  --  <3* <3*  --  <3*  CREATININE 0.34* 0.36*  --  0.31* 0.34*  --  0.38*  CALCIUM 8.3* 7.9*  --  7.7* 8.0*  --  8.1*  MG 1.7 1.5 1.6 1.5 1.7  --  1.7  PHOS 3.5  --   --   --   --  3.7  --    Liver Function Tests:  Recent Labs Lab 02/22/14 0535  AST 39*  ALT 16  ALKPHOS 110  BILITOT 0.5  PROT 5.2*  ALBUMIN 2.4*   No results found for this basename: LIPASE, AMYLASE,  in the last 168 hours  Recent Labs Lab 02/22/14 0500  AMMONIA 25   CBC:  Recent Labs Lab 02/19/14 0450 02/21/14 0400 02/22/14 0535 02/23/14 0839 02/23/14 1315  WBC 4.1 4.1 3.9* 4.0 2.8*  HGB 7.7* 7.4* 7.8* 8.4* 7.6*  HCT 24.5* 22.8* 24.0* 26.9* 23.7*  MCV 94.6 91.6 91.6 93.1 92.2  PLT 169 240 364 553* 486*   Cardiac Enzymes: No results found for this basename: CKTOTAL, CKMB, CKMBINDEX, TROPONINI,  in the last 168 hours BNP (last 3 results)  Recent Labs  11/28/13 2059  PROBNP 39.4   CBG:  Recent Labs Lab 02/20/14 0318 02/20/14 0800 02/20/14 1211 02/20/14 2016 02/21/14 0803  GLUCAP 75 73 110* 106* 91    No results found for this or any previous visit (from the past 240 hour(s)).   Studies: No results found.  Scheduled Meds: . carvedilol  6.25 mg Oral BID WC  . folic acid  1 mg Oral Daily  . heparin  5,000 Units Subcutaneous 3 times per day  . iron polysaccharides  150 mg Oral Daily  . levETIRAcetam  1,000 mg Oral BID  . lisinopril  5 mg Oral Daily  . magnesium oxide  800 mg Oral TID  . magnesium sulfate 1 - 4 g bolus IVPB  4 g Intravenous Once  . spironolactone  12.5 mg Oral Daily  . thiamine  100 mg Oral Daily   Continuous Infusions: .  sodium chloride 10 mL/hr at 02/21/14 [place], Nathon Stefanski  [facility] Pager [phone]. If 8PM-8AM, please contact night-coverage at [email or web address], password [facility] 02/24/2014, 7:20 AM  LOS: 11 days    **Disclaimer: This note may have been dictated with voice recognition software. Similar sounding words can inadvertently be transcribed and this note may contain transcription errors which may not have been corrected upon publication of note.**

## 2014-02-24 NOTE — Progress Notes (Signed)
Paged Iv team to come start a new PIV for pt. Pt is a hard stick.  IV team questioned the need for a new start since the pt is not receiving any meds at this time and there is no order in the chart. [facility] on call M. Lynch talked to and made aware of no IV access at this time. Pt NSR with no noted ectopy VSS, no c/o at this time. Jonette Eva stated that it was ok to leave PIV out this time. They will reassess in am. Will continue to monitor.

## 2014-02-25 LAB — MAGNESIUM: Magnesium: 2.2 mg/dL (ref 1.5–2.5)

## 2014-02-25 NOTE — Progress Notes (Signed)
[facility] PROGRESS NOTE Interim History: 38 y.o. F w/ a Hx alcohol abuse, substance abuse brought to ED by EMS on 7/27 after she began to have seizure like activity while watching TV at home. Boyfriend stated she began shaking and her eyes rolled back into her head. A few seconds later, she stopped breathing. He began CPR and dispatched EMS. In the field, she had a V.fib arrest with ROSC after epi x 1 and 1 shock.  In ED, she was found to be hypomagnesemic, hypokalemic, hypocalcemic. Neurology consulted due to concern that seizure like activity was in fact myoclonus. Continuous EEG was ordered to r/o status epilepticus. PCCM consulted for admission and initiation of hypothermia protocol. EKG revealed prolonged Qtc and ECHO revealed cardiomyopathy with EF 25% with anteroseptal severe hypokinesis and inferobasal dyskinesis concerning for underlying ischemia. She had no further seizures after arrival. She was eventually rewarmed and extubated and transferred to the SDU.   Assessment/Plan: Acute respiratory failure with hypoxia due to Cardiac arrest/  *Ventricular fibrillation: - Out of hospital CPR. Witness seizure by fiance. - Cont to be Asymptomatic. - Now in SR on betablockers  Acute on chronic systolic heart failure - Appreciate cardiology assistance. - Euvolemic, no JVD or lower ext edema. - Echo on 7.29.2015 showed EF 20% with segmental WMA, on BB/ACE-I and aldactone. - Cardiac cath on 8.7.2015 showed EF to normal and normal coronaries - Cardiology rec cardiac MRI to rule out sarcoid.  - -JVD, no lower ext edema. - She has had no events on telemetry.  Hypokalemia/Hypomagnesemia: - Cr < 1.0 cont aldactone. K >4.0 - cont magnesium replacement. - Phos 3.7, try to keep Mag > 2.0.  Substance abuse/  Severe alcohol use disorder: - resolved.  Normocytic anemia: - MCV < 100, >80, RDW high. - Ferritin unreliable as is acting as an acute phase reactant. - ferritin high b12 high  folate 13.8. - start nu- iron.  Acute encephalopathy/ Acute confusional state - No resolved, amonia level was mildly high no asterix  Moderate malnutrition -  Convulsions/seizures -Continue Keppra at current dose   Code Status: full Family Communication: none  Disposition Plan: inpatient   Consultants:  PCCM  Cardiology  Procedures: 7/27 Intubated, hypothermia protocol initiated, extubated on 7.31.2015 7/27 CTA head >>> nad  7/27 Continuous EEG >>> no seizures  7/29 TTE >>> EF 25%, severe hypokinesis anteroseptal myocardium, mild MR  7/29 Transvaginal ultrasound > small intrauterine cyst, s/p R salpingo-oophrectomy, L ovary intact    Antibiotics:  None  HPI/Subjective: Wants to go home.  Objective: Filed Vitals:   02/24/14 0500 02/24/14 1348 02/24/14 2031 02/25/14 0607  BP:  113/67 121/70 116/72  Pulse:  87 87 80  Temp:  98.6 F (37 C) 99.2 F (37.3 C) 98.1 F (36.7 C)  TempSrc:  Oral Oral Oral  Resp:  16 20 16   Height:      Weight: 54.9 kg (121 lb 0.5 oz)   53.661 kg (118 lb 4.8 oz)  SpO2:  95% 96% 96%    Intake/Output Summary (Last 24 hours) at 02/25/14 0953 Last data filed at 02/24/14 1300  Gross per 24 hour  Intake    120 ml  Output      0 ml  Net    120 ml   Filed Weights   02/23/14 0457 02/24/14 0500 02/25/14 0607  Weight: 56.11 kg (123 lb 11.2 oz) 54.9 kg (121 lb 0.5 oz) 53.661 kg (118 lb 4.8 oz)    Exam:  General: Alert, awake, oriented x3, in no acute distress.  HEENT: No bruits, no goiter. -JVD Heart: Regular rate and rhythm.pain reproducible by palpation. No lower ext edema. Lungs: Good air movement, clear Abdomen: Soft, nontender, nondistended, positive bowel sounds.     Data Reviewed: Basic Metabolic Panel:  Recent Labs Lab 02/19/14 0450 02/20/14 0353 02/20/14 1909 02/21/14 0400 02/22/14 0535 02/22/14 1105 02/23/14 1315 02/25/14 0340  NA 145 143  --  139 140  --  140  --   K 3.4* 3.5* 3.7 3.3* 4.7  --  4.0  --     CL 108 107  --  105 105  --  102  --   CO2 21 19  --  23 26  --  26  --   GLUCOSE 91 71  --  121* 89  --  83  --   BUN 7 5*  --  <3* <3*  --  <3*  --   CREATININE 0.34* 0.36*  --  0.31* 0.34*  --  0.38*  --   CALCIUM 8.3* 7.9*  --  7.7* 8.0*  --  8.1*  --   MG 1.7 1.5 1.6 1.5 1.7  --  1.7 2.2  PHOS 3.5  --   --   --   --  3.7  --   --    Liver Function Tests:  Recent Labs Lab 02/22/14 0535  AST 39*  ALT 16  ALKPHOS 110  BILITOT 0.5  PROT 5.2*  ALBUMIN 2.4*   No results found for this basename: LIPASE, AMYLASE,  in the last 168 hours  Recent Labs Lab 02/22/14 0500  AMMONIA 25   CBC:  Recent Labs Lab 02/19/14 0450 02/21/14 0400 02/22/14 0535 02/23/14 0839 02/23/14 1315  WBC 4.1 4.1 3.9* 4.0 2.8*  HGB 7.7* 7.4* 7.8* 8.4* 7.6*  HCT 24.5* 22.8* 24.0* 26.9* 23.7*  MCV 94.6 91.6 91.6 93.1 92.2  PLT 169 240 364 553* 486*   Cardiac Enzymes: No results found for this basename: CKTOTAL, CKMB, CKMBINDEX, TROPONINI,  in the last 168 hours BNP (last 3 results)  Recent Labs  11/28/13 2059  PROBNP 39.4   CBG:  Recent Labs Lab 02/20/14 0318 02/20/14 0800 02/20/14 1211 02/20/14 2016 02/21/14 0803  GLUCAP 75 73 110* 106* 91    No results found for this or any previous visit (from the past 240 hour(s)).   Studies: No results found.  Scheduled Meds: . carvedilol  6.25 mg Oral BID WC  . folic acid  1 mg Oral Daily  . heparin  5,000 Units Subcutaneous 3 times per day  . iron polysaccharides  150 mg Oral Daily  . levETIRAcetam  1,000 mg Oral BID  . lisinopril  5 mg Oral Daily  . magnesium oxide  800 mg Oral TID  . spironolactone  12.5 mg Oral Daily  . thiamine  100 mg Oral Daily   Continuous Infusions: . sodium chloride 10 mL/hr at 02/21/14 [place], ABRAHAM  [facility] Pager [phone]. If 8PM-8AM, please contact night-coverage at [email or web address], password [facility] 02/25/2014, 9:53 AM  LOS: 12 days    **Disclaimer: This note may have  been dictated with voice recognition software. Similar sounding words can inadvertently be transcribed and this note may contain transcription errors which may not have been corrected upon publication of note.**

## 2014-02-25 NOTE — Progress Notes (Signed)
    SUBJECTIVE:  Chest is sore.     PHYSICAL EXAM Filed Vitals:   02/24/14 0500 02/24/14 1348 02/24/14 2031 02/25/14 0607  BP:  113/67 121/70 116/72  Pulse:  87 87 80  Temp:  98.6 F (37 C) 99.2 F (37.3 C) 98.1 F (36.7 C)  TempSrc:  Oral Oral Oral  Resp:  16 20 16   Height:      Weight: 121 lb 0.5 oz (54.9 kg)   118 lb 4.8 oz (53.661 kg)  SpO2:  95% 96% 96%   General:  No acute distress Lungs:  Decreased breath sounds Heart:  RRR Abdomen:  Positive bowel sounds, no rebound no guarding Extremities:  Right femoral without hematoma or bruit.   LABS: Lab Results  Component Value Date   TROPONINI <0.30 12/22/2013   Results for orders placed during the hospital encounter of 02/13/14 (from the past 24 hour(s))  MAGNESIUM     Status: None   Collection Time    02/25/14  3:40 AM      Result Value Ref Range   Magnesium 2.2  1.5 - 2.5 mg/dL    Intake/Output Summary (Last 24 hours) at 02/25/14 1145 Last data filed at 02/25/14 1046  Gross per 24 hour  Intake    240 ml  Output      0 ml  Net    240 ml    ASSESSMENT AND PLAN:  VFIB ARREST:  Question related to severe electrolyte abnormalities on admission.  Normal coronaries on admission.    QT was prolonged. However, the QT is still prolonged.  She needs to see EP before discharge.  MRI can be arranged as an outpatient.    Jeneen Rinks [facility] 02/25/2014 11:45 AM

## 2014-02-26 DIAGNOSIS — I4901 Ventricular fibrillation: Principal | ICD-10-CM

## 2014-02-26 LAB — MAGNESIUM: Magnesium: 2.1 mg/dL (ref 1.5–2.5)

## 2014-02-26 NOTE — Progress Notes (Signed)
PT Cancellation and Discharge Note  Patient Details Name: Kristen Roberson MRN: [id number] DOB: 19-Jan-1976   Cancelled Treatment:    Reason Eval/Treat Not Completed: PT screened, no needs identified, will sign off.  Pt up amb independently in hallway.  Per pt and significant other pt independent with mobility now and only moves slowly 2/2 pain from chest compressions.  No further PT needs at this time.  Will sign off.     Shequila Neglia, Thornton Papas 02/26/2014, 2:21 PM

## 2014-02-26 NOTE — Progress Notes (Signed)
[facility] PROGRESS NOTE Interim History: 38 y.o. F w/ a Hx alcohol abuse, substance abuse brought to ED by EMS on 7/27 after she began to have seizure like activity while watching TV at home. Boyfriend stated she began shaking and her eyes rolled back into her head. A few seconds later, she stopped breathing. He began CPR and dispatched EMS. In the field, she had a V.fib arrest with ROSC after epi x 1 and 1 shock.  In ED, she was found to be hypomagnesemic, hypokalemic, hypocalcemic. Neurology consulted due to concern that seizure like activity was in fact myoclonus. Continuous EEG was ordered to r/o status epilepticus. PCCM consulted for admission and initiation of hypothermia protocol. EKG revealed prolonged Qtc and ECHO revealed cardiomyopathy with EF 25% with anteroseptal severe hypokinesis and inferobasal dyskinesis concerning for underlying ischemia. She had no further seizures after arrival. She was eventually rewarmed and extubated and transferred to the SDU.   Assessment/Plan: Acute respiratory failure with hypoxia due to Cardiac arrest/  *Ventricular fibrillation: - Out of hospital CPR. Witness seizure by fiance. - Cont to be Asymptomatic. - Now in SR on betablockers - Cardiology rec cardiac MRI to rule out sarcoid and EP evaluation due to prolong QTc. - on admission she was hypomagnesemic, hypokalemic, trazodone, hypocalcemic and narcotics. - She has had no events on telemetry.  Acute on chronic systolic heart failure - Appreciate cardiology assistance. - Euvolemic, no JVD or lower ext edema. - Echo on 7.29.2015 showed EF 20% with segmental WMA, on BB/ACE-I and aldactone. - Cardiac cath on 8.7.2015 showed EF to normal and normal coronaries  Hypokalemia/Hypomagnesemia: - Cr < 1.0 cont aldactone. K >4.0 - cont magnesium replacement. - Phos 3.7, try to keep Mag > 2.0.  Substance abuse/  Severe alcohol use disorder: - resolved.  Normocytic anemia: - MCV < 100, >80,  RDW high. - Ferritin unreliable as is acting as an acute phase reactant. - ferritin high b12 high folate 13.8. - cont nu- iron.  Acute encephalopathy/ Acute confusional state - No resolved, amonia level was mildly high no asterix  Moderate malnutrition -  Convulsions/seizures -Continue Keppra at current dose   Code Status: full Family Communication: none  Disposition Plan: inpatient   Consultants:  PCCM  Cardiology  Procedures: 7/27 Intubated, hypothermia protocol initiated, extubated on 7.31.2015 7/27 CTA head >>> nad  7/27 Continuous EEG >>> no seizures  7/29 TTE >>> EF 25%, severe hypokinesis anteroseptal myocardium, mild MR  7/29 Transvaginal ultrasound > small intrauterine cyst, s/p R salpingo-oophrectomy, L ovary intact    Antibiotics:  None  HPI/Subjective: Wants to go home. No complains  Objective: Filed Vitals:   02/25/14 0607 02/25/14 1425 02/25/14 1933 02/26/14 0500  BP: 116/72 107/66 98/55 107/61  Pulse: 80 75 75 75  Temp: 98.1 F (36.7 C) 98.1 F (36.7 C) 98.4 F (36.9 C) 97.4 F (36.3 C)  TempSrc: Oral Oral Oral Oral  Resp: 16 17 16 18   Height:      Weight: 53.661 kg (118 lb 4.8 oz)   53.116 kg (117 lb 1.6 oz)  SpO2: 96% 97% 95% 100%    Intake/Output Summary (Last 24 hours) at 02/26/14 0915 Last data filed at 02/25/14 1941  Gross per 24 hour  Intake    480 ml  Output      0 ml  Net    480 ml   Filed Weights   02/24/14 0500 02/25/14 0607 02/26/14 0500  Weight: 54.9 kg (121 lb 0.5 oz) 53.661  kg (118 lb 4.8 oz) 53.116 kg (117 lb 1.6 oz)    Exam:  General: Alert, awake, oriented x3, in no acute distress.  HEENT: No bruits, no goiter. -JVD Heart: Regular rate and rhythm.pain reproducible by palpation. No lower ext edema. Lungs: Good air movement, clear Abdomen: Soft, nontender, nondistended, positive bowel sounds.     Data Reviewed: Basic Metabolic Panel:  Recent Labs Lab 02/20/14 0353 02/20/14 1909 02/21/14 0400  02/22/14 0535 02/22/14 1105 02/23/14 1315 02/25/14 0340 02/26/14 0505  NA 143  --  139 140  --  140  --   --   K 3.5* 3.7 3.3* 4.7  --  4.0  --   --   CL 107  --  105 105  --  102  --   --   CO2 19  --  23 26  --  26  --   --   GLUCOSE 71  --  121* 89  --  83  --   --   BUN 5*  --  <3* <3*  --  <3*  --   --   CREATININE 0.36*  --  0.31* 0.34*  --  0.38*  --   --   CALCIUM 7.9*  --  7.7* 8.0*  --  8.1*  --   --   MG 1.5 1.6 1.5 1.7  --  1.7 2.2 2.1  PHOS  --   --   --   --  3.7  --   --   --    Liver Function Tests:  Recent Labs Lab 02/22/14 0535  AST 39*  ALT 16  ALKPHOS 110  BILITOT 0.5  PROT 5.2*  ALBUMIN 2.4*   No results found for this basename: LIPASE, AMYLASE,  in the last 168 hours  Recent Labs Lab 02/22/14 0500  AMMONIA 25   CBC:  Recent Labs Lab 02/21/14 0400 02/22/14 0535 02/23/14 0839 02/23/14 1315  WBC 4.1 3.9* 4.0 2.8*  HGB 7.4* 7.8* 8.4* 7.6*  HCT 22.8* 24.0* 26.9* 23.7*  MCV 91.6 91.6 93.1 92.2  PLT 240 364 553* 486*   Cardiac Enzymes: No results found for this basename: CKTOTAL, CKMB, CKMBINDEX, TROPONINI,  in the last 168 hours BNP (last 3 results)  Recent Labs  11/28/13 2059  PROBNP 39.4   CBG:  Recent Labs Lab 02/20/14 0318 02/20/14 0800 02/20/14 1211 02/20/14 2016 02/21/14 0803  GLUCAP 75 73 110* 106* 91    No results found for this or any previous visit (from the past 240 hour(s)).   Studies: No results found.  Scheduled Meds: . carvedilol  6.25 mg Oral BID WC  . folic acid  1 mg Oral Daily  . heparin  5,000 Units Subcutaneous 3 times per day  . iron polysaccharides  150 mg Oral Daily  . levETIRAcetam  1,000 mg Oral BID  . lisinopril  5 mg Oral Daily  . magnesium oxide  800 mg Oral TID  . spironolactone  12.5 mg Oral Daily  . thiamine  100 mg Oral Daily   Continuous Infusions: . sodium chloride 10 mL/hr at 02/21/14 [place], ABRAHAM  [facility] Pager [phone]. If 8PM-8AM, please  contact night-coverage at [email or web address], password [facility] 02/26/2014, 9:15 AM  LOS: 13 days    **Disclaimer: This note may have been dictated with voice recognition software. Similar sounding words can inadvertently be transcribed and this note may contain transcription errors which may not have been corrected upon  publication of note.**

## 2014-02-26 NOTE — Consult Note (Signed)
ELECTROPHYSIOLOGY CONSULT NOTE    Patient ID: Kristen Roberson MRN: [id number], DOB/AGE: January 10, 1976 38 y.o.  Admit date: 02/13/2014 Date of Consult: 02-26-14  Primary Physician: Angelica Chessman, MD Primary Cardiologist: new to [facility]  Reason for Consultation: VF arrest  HPI:  Kristen Roberson is a 38 y.o. female with a past medical history significant for anemia, seizures, and alcohol abuse.  She had a witnessed cardiac arrest on 02-13-14.  Her boyfriend who was with her called 911 and started CPR.  She was brought to [facility] and underwent hypothermia protocol.  On admission, K was 2.8, Mg 1.0, ETOH level 375.  She has had multiple ER admissions over the past few months for vaginal bleeding and abdominal pain.  She has recovered neurologically.  Her electrolytes have been repleted and maintained.  Initial echocardiogram demonstrated LV dysfunction, however, this normalized by the time of her catheterization which demonstrated normal coronaries.  Her QTc interval has returned to her baseline (~562msec).   Family history is notable for alcohol abuse in multiple family members along with seizures.  No sudden death.    She currently feels well and is without shortness of breath, palpitations, or dizziness.  She continues to have midsternal chest soreness from CPR.  ROS is otherwise negative except as outlined above.  She endorses heavy alcohol abuse.  She denies recreational drug use.   EP has been asked to evaluate for treatment options.   Past Medical History  Diagnosis Date  . Proctitis   . Cysts of both ovaries   . Seizures   . Anemia   . Anxiety   . Blood transfusion without reported diagnosis   . Depression   . Fatty liver 10/05/13     Surgical History:  Past Surgical History  Procedure Laterality Date  . Ovarian cyst removal    . Laparoscopy N/A 09/28/2013    Procedure: LAPAROSCOPY OPERATIVE;  Surgeon: Terrance Mass, MD;  Location: [facility];  Service: Gynecology;  Laterality:  N/A;  . Laparoscopic appendectomy Right 09/28/2013    Procedure: APPENDECTOMY LAPAROSCOPIC;  Surgeon: Terrance Mass, MD;  Location: [facility];  Service: Gynecology;  Laterality: Right;  . Salpingoophorectomy Right 09/28/2013    Procedure: SALPINGO OOPHORECTOMY;  Surgeon: Terrance Mass, MD;  Location: [facility];  Service: Gynecology;  Laterality: Right;  . Colonoscopy N/A 09/30/2013    Procedure: COLONOSCOPY;  Surgeon: Lafayette Dragon, MD;  Location: [facility];  Service: Endoscopy;  Laterality: N/A;  . Esophagogastroduodenoscopy N/A 11/23/2013    Procedure: ESOPHAGOGASTRODUODENOSCOPY (EGD);  Surgeon: Jerene Bears, MD;  Location: [facility];  Service: Endoscopy;  Laterality: N/A;  . Appendectomy       Prescriptions prior to admission  Medication Sig Dispense Refill  . acamprosate (CAMPRAL) 333 MG tablet Take 2 tablets (666 mg total) by mouth 3 (three) times daily with meals.  180 tablet  0  . traZODone (DESYREL) 25 mg TABS tablet Take 150 mg by mouth at bedtime as needed for sleep.      Marland Kitchen acetaminophen (TYLENOL) 325 MG tablet Take 650 mg by mouth every 6 (six) hours as needed for moderate pain.      Marland Kitchen FLUoxetine (PROZAC) 20 MG capsule Take 40 mg by mouth daily.      Marland Kitchen tetrahydrozoline 0.05 % ophthalmic solution Place 1 drop into both eyes 2 (two) times daily as needed (dry eyes).        Inpatient Medications:  . carvedilol  6.25 mg Oral BID WC  .  folic acid  1 mg Oral Daily  . heparin  5,000 Units Subcutaneous 3 times per day  . iron polysaccharides  150 mg Oral Daily  . levETIRAcetam  1,000 mg Oral BID  . lisinopril  5 mg Oral Daily  . magnesium oxide  800 mg Oral TID  . spironolactone  12.5 mg Oral Daily  . thiamine  100 mg Oral Daily    Allergies:  Allergies  Allergen Reactions  . Morphine And Related Anaphylaxis     Tolerated hydromorphone on 11/25/13.   . Tramadol Other (See Comments)    Seizures   . Penicillins Other (See Comments)    Unknown childhood reaction.     History   Social History  . Marital Status: Legally Separated    Spouse Name: N/A    Number of Children: N/A  . Years of Education: N/A   Occupational History  . Not on file.   Social History Main Topics  . Smoking status: Never Smoker   . Smokeless tobacco: Never Used  . Alcohol Use: Yes  . Drug Use: No  . Sexual Activity: Not on file   Other Topics Concern  . Not on file   Social History Narrative  . No narrative on file     Family History  Problem Relation Age of Onset  . Diabetes Mother   . Hyperlipidemia Mother   . Stroke Mother   . Diabetes Father     BP 107/61  Pulse 75  Temp(Src) 97.4 F (36.3 C) (Oral)  Resp 18  Ht 5\' 6"  (1.676 m)  Wt 117 lb 1.6 oz (53.116 kg)  BMI 18.91 kg/m2  SpO2 100%  LMP 02/18/2014  Breastfeeding? No  Physical Exam: Well appearing 38 yo woman, NAD HEENT: Unremarkable,[place], AT Neck:  7 JVD, no thyromegally Back:  No CVA tenderness Lungs:  Clear with no wheezes, rales, or rhonchi HEART:  Regular rate rhythm, no murmurs, no rubs, no clicks Abd:  soft, positive bowel sounds, no organomegally, no rebound, no guarding Ext:  2 plus pulses, no edema, no cyanosis, no clubbing Skin:  No rashes no nodules Neuro:  CN II through XII intact, motor grossly intact   Labs:   Lab Results  Component Value Date   WBC 2.8* 02/23/2014   HGB 7.6* 02/23/2014   HCT 23.7* 02/23/2014   MCV 92.2 02/23/2014   PLT 486* 02/23/2014    Recent Labs Lab 02/22/14 0535 02/23/14 1315  NA 140 140  K 4.7 4.0  CL 105 102  CO2 26 26  BUN <3* <3*  CREATININE 0.34* 0.38*  CALCIUM 8.0* 8.1*  PROT 5.2*  --   BILITOT 0.5  --   ALKPHOS 110  --   ALT 16  --   AST 39*  --   GLUCOSE 89 83    Radiology/Studies:  Dg Chest Port 1 View 02/19/2014   CLINICAL DATA:  Respiratory distress  EXAM: PORTABLE CHEST - 1 VIEW  COMPARISON:  02/17/2014  FINDINGS: Right IJ central line, tip at the lower SVC. Normal heart size and mediastinal contours for technique.  Increasingly dense airspace disease in the lower lungs, right more than left. There may be small pleural effusions. No pulmonary edema. No pneumothorax.  IMPRESSION: Worsening bibasilar pneumonia.   Electronically Signed   By: Jorje Guild M.D.   On: 02/19/2014 05:21   [id number] rhythm, rate 84, normal intervals, QTc 499  TELEMETRY: sinus rhythm with rare PVC's  A/P 1. VF arrest 2. Baseline prolongation  of the QT interval 3. Hypokalemia, hypomagnesemia, and hypocalcemia, in the setting of nausea and vomiting due to ETOH abuse. 4. Mild residual encephalopathy 5. LV dysfunction, which appears to have normalized, MRI scan pending. Rec: a confusing set of circumstances. The patient's ventricular fibrillation cardiac arrest appears to be secondary to her marked electrolyte abnormalities caused by alcohol abuse and associated nausea and vomiting. Previously, she has not had syncope. The status of the patient's left ventricle is somewhat uncertain, and cardiac MRI scan has been scheduled, and results are pending. If the patient has evidence of scarring on cardiac MRI scanning, additional electrophysiologic evaluation would be recommended, and a life vest strongly consider. If no evidence of scar on cardiac MRI as well as normal left ventricular function, a beta blocker, careful followup of her electrolytes, and abstinence from all alcohol would be recommended. She'll need a repeat ECG in a couple of weeks. I would like to see her in followup after that.  Cristopher Peru, M.D.

## 2014-02-26 NOTE — Progress Notes (Signed)
Speech Language Pathology Treatment: Dysphagia  Patient Details Name: Kristen Roberson MRN: [id number] DOB: 03-Sep-1975 Today's Date: 02/26/2014 Time: 4431-5400 SLP Time Calculation (min): 8 min  Assessment / Plan / Recommendation Clinical Impression  Pt.'s trunk low in bed and unable to elicit an effective chin tuck due to positioning.  SLP provided clinical reasoning for upright position for adequate chin tuck but she refused due to complaints of significant muscular chest pain s/p chest compressions.  No indications of aspiration with straw sips thin liquids, however pt. Silently aspirated during MBS.  Continue regular diet and thin liquids, out of bed during meals.  She will likely need repeat MBS to eliminate compensatory strategies due to silent aspiration.   HPI HPI: 38 y.o. female with hx of alcohol abuse admitted to ED 7/27after having seizure at home. In ED, found to be hypomagnesemic, hypokalemic, hypocalcemic. 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 Intubated 7/27, extubated 7/31 (4 days). CXR 8/1 revealed R basilar opacity suspicious for PNA. Pt now lethargic and agitated, likely alcohol withdrawal- sedation. Bedside swallow eval ordered to evaluate swallow function post- extubation.   Pertinent Vitals Pain Assessment:  (complains of significant pain (chest, from CPR))  SLP Plan  Continue with current plan of care    Recommendations Diet recommendations: Regular;Thin liquid Liquids provided via: Straw Medication Administration: Whole meds with puree Supervision: Patient able to self feed;Intermittent supervision to cue for compensatory strategies Compensations: Slow rate;Small sips/bites Postural Changes and/or Swallow Maneuvers: Chin tuck;Seated upright 90 degrees;Out of bed for meals              Oral Care Recommendations: Oral care BID Follow up Recommendations:  (TBD) Plan: Continue with current  plan of care         Houston Siren M.Ed [employer] [phone]  02/26/2014

## 2014-02-26 NOTE — Progress Notes (Signed)
VF arrest of unknown etiology - normal coronaries, EF improved at cath. MRI is reasonable as outpatient. Plan for EP evaluation prior to d/c.  I will ask them to evaluate her today to see if she is potentially an ICD candidate.  Pixie Casino, MD, [facility] Attending Cardiologist [place]

## 2014-02-27 ENCOUNTER — Inpatient Hospital Stay (HOSPITAL_COMMUNITY): Payer: Self-pay

## 2014-02-27 DIAGNOSIS — I428 Other cardiomyopathies: Secondary | ICD-10-CM

## 2014-02-27 LAB — MAGNESIUM: Magnesium: 2 mg/dL (ref 1.5–2.5)

## 2014-02-27 MED ORDER — CARVEDILOL 6.25 MG PO TABS: 6.2500 mg | ORAL_TABLET | Freq: Two times a day (BID) | ORAL | Status: DC

## 2014-02-27 MED ORDER — MAGNESIUM OXIDE 400 (241.3 MG) MG PO TABS: 400.0000 mg | ORAL_TABLET | Freq: Three times a day (TID) | ORAL | Status: DC

## 2014-02-27 MED ORDER — GADOBENATE DIMEGLUMINE 529 MG/ML IV SOLN
15.0000 mL | Freq: Once | INTRAVENOUS | Status: AC | PRN
  Administered 2014-02-27: 15 mL via INTRAVENOUS

## 2014-02-27 MED ORDER — LEVETIRACETAM 1000 MG PO TABS: 1000.0000 mg | ORAL_TABLET | Freq: Two times a day (BID) | ORAL | Status: DC

## 2014-02-27 MED ORDER — POLYSACCHARIDE IRON COMPLEX 150 MG PO CAPS: 150.0000 mg | ORAL_CAPSULE | Freq: Every day | ORAL | Status: DC

## 2014-02-27 NOTE — Progress Notes (Signed)
Pt. Educated with discharge information, to be discharged home with significant other.  Tele removed.  IV discontinued. Pt. Provided w/ taxi voucher.

## 2014-02-27 NOTE — Progress Notes (Signed)
Appreciate EP recs. Cardiac MRI performed today. Interpretation to follow. Recommendations for discharge on b-blocker and follow-up with Dr. Lovena Le as an outpatient.  Pixie Casino, MD, [facility] Attending Cardiologist [place]

## 2014-02-27 NOTE — Progress Notes (Addendum)
[facility] PROGRESS NOTE Interim History: 38 y.o. F w/ a Hx alcohol abuse, substance abuse brought to ED by EMS on 7/27 after she began to have seizure like activity while watching TV at home. Boyfriend stated she began shaking and her eyes rolled back into her head. A few seconds later, she stopped breathing. He began CPR and dispatched EMS. In the field, she had a V.fib arrest with ROSC after epi x 1 and 1 shock.  In ED, she was found to be hypomagnesemic, hypokalemic, hypocalcemic. Neurology consulted due to concern that seizure like activity was in fact myoclonus. Continuous EEG was ordered to r/o status epilepticus. PCCM consulted for admission and initiation of hypothermia protocol. EKG revealed prolonged Qtc and ECHO revealed cardiomyopathy with EF 25% with anteroseptal severe hypokinesis and inferobasal dyskinesis concerning for underlying ischemia. on admission she was hypomagnesemic, hypokalemic, trazodone, hypocalcemic and narcotics. She had no further seizures after arrival. She was eventually rewarmed and extubated and transferred to the SDU. Cardiology consulted recommended a a cath on 8.7.2015 showed EF to normal and normal coronaries and EP consult whichc is pending.    Assessment/Plan: Acute respiratory failure with hypoxia due to Cardiac arrest/  *Ventricular fibrillation: - Out of hospital CPR.  - Now in SR on betablockers - Cardiology rec cardiac MRI to rule out sarcoid and EP evaluation due to prolong QTc. - on admission she was hypomagnesemic, hypokalemic, trazodone, hypocalcemic and narcotics.  - She has had no events on telemetry.   Acute on chronic systolic heart failure - Appreciate cardiology assistance. - Euvolemic, no JVD. - Echo on 7.29.2015 showed EF 20% with segmental WMA, on BB/ACE-I and aldactone.  - Cardiac cath on 8.7.2015 showed EF to normal and normal coronaries  Hypokalemia/Hypomagnesemia: - Cr < 1.0 cont aldactone. K >4.0 - Mag > 2.0. -  Ferritin unreliable as is acting as an acute phase reactant  Substance abuse/  Severe alcohol use disorder: - resolved.  Normocytic anemia: - MCV < 100, >80, RDW high. - cont nu- iron.  Acute encephalopathy/ Acute confusional state - No resolved, amonia level was mildly high no asterix  Moderate malnutrition -  Convulsions/seizures -Continue Keppra at current dose   Code Status: full Family Communication: none  Disposition Plan: inpatient   Consultants:  PCCM  Cardiology  Procedures: 7/27 Intubated, hypothermia protocol initiated, extubated on 7.31.2015 7/27 CTA head >>> nad  7/27 Continuous EEG >>> no seizures  7/29 TTE >>> EF 25%, severe hypokinesis anteroseptal myocardium, mild MR  7/29 Transvaginal ultrasound > small intrauterine cyst, s/p R salpingo-oophrectomy, L ovary intact    Antibiotics:  None  HPI/Subjective: Wants to go home. No complains  Objective: Filed Vitals:   02/26/14 1315 02/26/14 2016 02/27/14 0406 02/27/14 1046  BP: 92/51 91/50 90/53  102/55  Pulse: 75 79 75   Temp: 97.8 F (36.6 C) 98.1 F (36.7 C) 97.9 F (36.6 C)   TempSrc: Tympanic Oral Oral   Resp: 18 18 16    Height:      Weight:   53.388 kg (117 lb 11.2 oz)   SpO2: 98% 100% 98%     Intake/Output Summary (Last 24 hours) at 02/27/14 1240 Last data filed at 02/27/14 0600  Gross per 24 hour  Intake   1200 ml  Output      0 ml  Net   1200 ml   Filed Weights   02/25/14 0607 02/26/14 0500 02/27/14 0406  Weight: 53.661 kg (118 lb 4.8 oz) 53.116 kg (117 lb  1.6 oz) 53.388 kg (117 lb 11.2 oz)    Exam: General: in no acute distress.  HEENT: No bruits, no goiter. -JVD Heart: Regular rate and rhythm.pain reproducible by palpation.  Lungs: Good air movement, clear Abdomen: Soft, nontender, nondistended, positive bowel sounds.     Data Reviewed: Basic Metabolic Panel:  Recent Labs Lab 02/20/14 1909 02/21/14 0400 02/22/14 0535 02/22/14 1105 02/23/14 1315  02/25/14 0340 02/26/14 0505 02/27/14 0400  NA  --  139 140  --  140  --   --   --   K 3.7 3.3* 4.7  --  4.0  --   --   --   CL  --  105 105  --  102  --   --   --   CO2  --  23 26  --  26  --   --   --   GLUCOSE  --  121* 89  --  83  --   --   --   BUN  --  <3* <3*  --  <3*  --   --   --   CREATININE  --  0.31* 0.34*  --  0.38*  --   --   --   CALCIUM  --  7.7* 8.0*  --  8.1*  --   --   --   MG 1.6 1.5 1.7  --  1.7 2.2 2.1 2.0  PHOS  --   --   --  3.7  --   --   --   --    Liver Function Tests:  Recent Labs Lab 02/22/14 0535  AST 39*  ALT 16  ALKPHOS 110  BILITOT 0.5  PROT 5.2*  ALBUMIN 2.4*   No results found for this basename: LIPASE, AMYLASE,  in the last 168 hours  Recent Labs Lab 02/22/14 0500  AMMONIA 25   CBC:  Recent Labs Lab 02/21/14 0400 02/22/14 0535 02/23/14 0839 02/23/14 1315  WBC 4.1 3.9* 4.0 2.8*  HGB 7.4* 7.8* 8.4* 7.6*  HCT 22.8* 24.0* 26.9* 23.7*  MCV 91.6 91.6 93.1 92.2  PLT 240 364 553* 486*   Cardiac Enzymes: No results found for this basename: CKTOTAL, CKMB, CKMBINDEX, TROPONINI,  in the last 168 hours BNP (last 3 results)  Recent Labs  11/28/13 2059  PROBNP 39.4   CBG:  Recent Labs Lab 02/20/14 2016 02/21/14 0803  GLUCAP 106* 91    No results found for this or any previous visit (from the past 240 hour(s)).   Studies: No results found.  Scheduled Meds: . carvedilol  6.25 mg Oral BID WC  . folic acid  1 mg Oral Daily  . heparin  5,000 Units Subcutaneous 3 times per day  . iron polysaccharides  150 mg Oral Daily  . levETIRAcetam  1,000 mg Oral BID  . lisinopril  5 mg Oral Daily  . magnesium oxide  800 mg Oral TID  . spironolactone  12.5 mg Oral Daily  . thiamine  100 mg Oral Daily   Continuous Infusions: . sodium chloride 10 mL/hr at 02/21/14 [place], [place]  [facility] Pager [phone]. If 8PM-8AM, please contact night-coverage at [email or web address], password [facility] 02/27/2014, 12:40 PM  LOS:  14 days    **Disclaimer: This note may have been dictated with voice recognition software. Similar sounding words can inadvertently be transcribed and this note may contain transcription errors which may not have been corrected upon publication of note.**

## 2014-02-27 NOTE — Discharge Summary (Signed)
Physician Discharge Summary  Kristen Roberson PYY:[id number] DOB: 03-Dec-1975 DOA: 02/13/2014  PCP: Angelica Chessman, MD  Admit date: 02/13/2014 Discharge date: 02/27/2014  Time spent: 35 minutes  Recommendations for Outpatient Follow-up:  1. Follow up with PCP in 2 weeks 2. Follow up with Dr. Lovena Le in 2 weeks check a b-met and mag level.  Discharge Diagnoses:  Principal Problem:   Ventricular fibrillation Active Problems:   Acute respiratory failure with hypoxia   Cardiac arrest   Adjustment disorder with depressed mood   Severe alcohol use disorder   Convulsions/seizures   Moderate malnutrition   Substance abuse   Acute systolic heart failure - s/p VF Cardiac Arrest   Acute encephalopathy   Acute confusional state   Discharge Condition: stable  Diet recommendation: regular  Filed Weights   02/25/14 0607 02/26/14 0500 02/27/14 0406  Weight: 53.661 kg (118 lb 4.8 oz) 53.116 kg (117 lb 1.6 oz) 53.388 kg (117 lb 11.2 oz)    History of present illness:  38 y.o. F with hx of alcohol abuse brought to ED on 7/27 after she began to have seizure like activity while watching TV at home. Boyfriend states she began shaking and eyes rolled back into her head. A few seconds later, she stopped breathing. He began CPR and dispatched EMS. In the field, she had a V.fib arrest with ROSC after epi x 1 and 1 shock. In ED, found to be hypomagnesemic, hypokalemic, hypocalcemic. 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    Hospital Course:  Acute respiratory failure with hypoxia due to Cardiac arrest/ *Ventricular fibrillation/Acute on chronic systolic heart failure :  - Out of hospital CPR. Witness seizure by fiance.  -Admitted to PCCM Intubated-> extubated on 7.31.2015,started on the arctic sun protocol on admission.  treated with precedex and off on 8.2.2015 - on admission she was hypomagnesemic, hypokalemic, trazodone,  hypocalcemic and narcotics.  - Consulted cardiology that started Beta blockers, now in SR on betablocker's. - Echo on 7.29.2015 showed EF 20% with segmental WMA, on BB/ACE-I and aldactone.  - Cardiac cath on 8.7.2015 showed EF to normal and normal coronaries  - Cardiology rec cardiac MRI to rule out sarcoid, which will be followed by cardiology as an outpatient. - EP evaluation due to prolong QTc. That will follow her as an outpatient in 2 weeks. - she was given 2 doses of IV lasix, the diuretics held.  Hypokalemia/Hypomagnesemia:  - was repleted aggressively. Cr return to baseline. - Cr < 1.0 cont aldactone. K >4.0  - cont magnesium replacement.  - Phos 3.7, try to keep Mag > 2.0.   Substance abuse/ Severe alcohol use disorder:  - treated with precedex.  Normocytic anemia:  - MCV < 100, >80, RDW high.  - cont nu- iron.   Acute encephalopathy/ Acute confusional state  - Amonia level was mildly high no asterix, most likely due to ETOH withdrawal and acute confusional state. - now resovled.  Moderate malnutrition  -  Convulsions/seizures: - consulted neurology who recommended keppra. -Continue Keppra at current dose   Procedures: 7/27 Intubated, hypothermia protocol initiated, extubated on 7.31.2015  7/27 CTA head >>> nad  7/27 Continuous EEG >>> no seizures  7/29 TTE >>> EF 25%, severe hypokinesis anteroseptal myocardium, mild MR    Consultations:  Cardiology  PCCM  Neurology  Discharge Exam: Filed Vitals:   02/27/14 1653  BP: 89/42  Pulse: 73  Temp:   Resp:  General: see progress note  Discharge Instructions You were cared for by a hospitalist during your hospital stay. If you have any questions about your discharge medications or the care you received while you were in the hospital after you are discharged, you can call the unit and asked to speak with the hospitalist on call if the hospitalist that took care of you is not available. Once you are  discharged, your primary care physician will handle any further medical issues. Please note that NO REFILLS for any discharge medications will be authorized once you are discharged, as it is imperative that you return to your primary care physician (or establish a relationship with a primary care physician if you do not have one) for your aftercare needs so that they can reassess your need for medications and monitor your lab values.  Discharge Instructions   Diet - low sodium heart healthy    Complete by:  As directed      Increase activity slowly    Complete by:  As directed             Medication List    STOP taking these medications       hydrocodone-ibuprofen 5-200 MG per tablet  Commonly known as:  VICOPROFEN     traZODone 25 mg Tabs tablet  Commonly known as:  DESYREL      TAKE these medications       acamprosate 333 MG tablet  Commonly known as:  CAMPRAL  Take 2 tablets (666 mg total) by mouth 3 (three) times daily with meals.     acetaminophen 325 MG tablet  Commonly known as:  TYLENOL  Take 650 mg by mouth every 6 (six) hours as needed for moderate pain.     carvedilol 6.25 MG tablet  Commonly known as:  COREG  Take 1 tablet (6.25 mg total) by mouth 2 (two) times daily with a meal.     FLUoxetine 20 MG capsule  Commonly known as:  PROZAC  Take 40 mg by mouth daily.     iron polysaccharides 150 MG capsule  Commonly known as:  NIFEREX  Take 1 capsule (150 mg total) by mouth daily.     levETIRAcetam 1000 MG tablet  Commonly known as:  KEPPRA  Take 1 tablet (1,000 mg total) by mouth 2 (two) times daily.     magnesium oxide 400 (241.3 MG) MG tablet  Commonly known as:  MAG-OX  Take 1 tablet (400 mg total) by mouth 3 (three) times daily.     tetrahydrozoline 0.05 % ophthalmic solution  Place 1 drop into both eyes 2 (two) times daily as needed (dry eyes).       Allergies  Allergen Reactions  . Morphine And Related Anaphylaxis     Tolerated hydromorphone  on 11/25/13.   . Tramadol Other (See Comments)    Seizures   . Penicillins Other (See Comments)    Unknown childhood reaction.       Follow-up Information   Follow up with [place]     On 03/06/2014. [facility] follow up appt; please bring photo ID and all medications you are currently taking to your appt.  )    Contact information:   [place] [id number] [phone]       The results of significant diagnostics from this hospitalization (including imaging, microbiology, ancillary and laboratory) are listed below for reference.    Significant Diagnostic Studies: Ct Angio  Head W/cm &/or Wo Cm  02/13/2014   CLINICAL DATA:  Unresponsive.  EXAM: CT ANGIOGRAPHY HEAD  TECHNIQUE: Multidetector CT imaging of the head was performed using the standard protocol during bolus administration of intravenous contrast. Multiplanar CT image reconstructions and MIPs were obtained to evaluate the vascular anatomy.  CONTRAST:  58m OMNIPAQUE IOHEXOL 350 MG/ML SOLN  COMPARISON:  None.  FINDINGS: CT head: The ventricles and sulci are normal. No intraparenchymal hemorrhage, mass effect nor midline shift. No acute large vascular territory infarcts. Postcontrast imaging demonstrates no abnormal parenchymal nor leptomeningeal enhancement.  No abnormal extra-axial fluid collections. Basal cisterns are patent.  No skull fracture. The included ocular globes and orbital contents are non-suspicious. The mastoid aircells and included paranasal sinuses are well-aerated. Life support lines in place, seen on the localizer.  CTA head: Anterior circulation: Normal appearance of the cervical internal carotid arteries, petrous, cavernous and supra clinoid internal carotid arteries. Widely patent anterior communicating artery. Normal appearance of the anterior and middle cerebral arteries.  Posterior circulation: Left vertebral artery is dominant, with normal appearance of  the vertebral arteries, vertebrobasilar junction and basilar artery, as well as main branch vessels. Small bilateral posterior communicating arteries are present. Normal appearance of the posterior cerebral arteries.  No large vessel occlusion, hemodynamically significant stenosis, dissection, luminal irregularity, contrast extravasation or aneurysm within the anterior nor posterior circulation.  Review of the MIP images confirms the above findings.  IMPRESSION: CT head: No acute intracranial process, normal examination. No abnormal enhancement.  CTA head:  No acute vascular process, normal examination.   Electronically Signed   By: CElon Alas  On: 02/13/2014 22:28   Dg Ankle Complete Left  02/08/2014   CLINICAL DATA:  Left ankle pain and discoloration for 1 day.  EXAM: LEFT ANKLE COMPLETE - 3+ VIEW  COMPARISON:  None.  FINDINGS: No acute bony or joint abnormality is identified. Small osteophyte off the dorsal aspect of the proximal navicular bone is noted. Soft tissue structures appear normal.  IMPRESSION: Negative exam.   Electronically Signed   By: TInge RiseM.D.   On: 02/08/2014 16:20   UKoreaTransvaginal Non-ob  02/14/2014   CLINICAL DATA:  Pregnant, post ventricular fibrillation and arrest, reported recent miscarriage, past history of RIGHT salpingo-oophorectomy ; quantitative beta HCG < 1  EXAM: TRANSABDOMINAL AND TRANSVAGINAL ULTRASOUND OF PELVIS  TECHNIQUE: Both transabdominal and transvaginal ultrasound examinations of the pelvis were performed. Transabdominal technique was performed for global imaging of the pelvis including uterus, ovaries, adnexal regions, and pelvic cul-de-sac. It was necessary to proceed with endovaginal exam following the transabdominal exam to visualize the LEFT ovary and RIGHT adnexa.  COMPARISON:  12/22/2013  FINDINGS: Uterus  Measurements: 9.2 x 4.1 x 5.2 cm. Normal myometrial echogenicity. Small cystic structure 3 x 3 x 4 mm identified at the deep margin of the  endometrial complex and myometrium, likely representing a tiny cyst in this patient with a negative pregnancy test. No additional uterine masses.  Endometrium  Thickness: 9 mm thick, normal.  No endometrial fluid.  Right ovary  Surgically absent.  Left ovary  Measurements: 2.9 x 1.5 x 3.1 cm. Normal morphology without mass.  Other findings  Moderate simple free pelvic fluid.  No adnexal masses.  IMPRESSION: Post RIGHT salpingo-oophorectomy.  Unremarkable LEFT ovary and endometrial complex.  Tiny 3 x 3 x 4 mm diameter cyst within uterus, patient with a quantitative beta HCG < 1.   Electronically Signed   By: MCrist InfanteD.  On: 02/14/2014 16:41   US Pelvis Complete  02/14/2014   CLINICAL DATA:  Pregnant, post ventricular fibrillation and arrest, reported recent miscarriage, past history of RIGHT salpingo-oophorectomy ; quantitative beta HCG < 1  EXAM: TRANSABDOMINAL AND TRANSVAGINAL ULTRASOUND OF PELVIS  TECHNIQUE: Both transabdominal and transvaginal ultrasound examinations of the pelvis were performed. Transabdominal technique was performed for global imaging of the pelvis including uterus, ovaries, adnexal regions, and pelvic cul-de-sac. It was necessary to proceed with endovaginal exam following the transabdominal exam to visualize the LEFT ovary and RIGHT adnexa.  COMPARISON:  12/22/2013  FINDINGS: Uterus  Measurements: 9.2 x 4.1 x 5.2 cm. Normal myometrial echogenicity. Small cystic structure 3 x 3 x 4 mm identified at the deep margin of the endometrial complex and myometrium, likely representing a tiny cyst in this patient with a negative pregnancy test. No additional uterine masses.  Endometrium  Thickness: 9 mm thick, normal.  No endometrial fluid.  Right ovary  Surgically absent.  Left ovary  Measurements: 2.9 x 1.5 x 3.1 cm. Normal morphology without mass.  Other findings  Moderate simple free pelvic fluid.  No adnexal masses.  IMPRESSION: Post RIGHT salpingo-oophorectomy.  Unremarkable LEFT ovary  and endometrial complex.  Tiny 3 x 3 x 4 mm diameter cyst within uterus, patient with a quantitative beta HCG < 1.   Electronically Signed   By: Lavonia Dana M.D.   On: 02/14/2014 16:41   Dg Chest Port 1 View  02/19/2014   CLINICAL DATA:  Respiratory distress  EXAM: PORTABLE CHEST - 1 VIEW  COMPARISON:  02/17/2014  FINDINGS: Right IJ central line, tip at the lower SVC. Normal heart size and mediastinal contours for technique. Increasingly dense airspace disease in the lower lungs, right more than left. There may be small pleural effusions. No pulmonary edema. No pneumothorax.  IMPRESSION: Worsening bibasilar pneumonia.   Electronically Signed   By: Jorje Guild M.D.   On: 02/19/2014 05:21   Dg Chest Port 1 View  02/17/2014   CLINICAL DATA:  Extubation.  EXAM: PORTABLE CHEST - 1 VIEW  COMPARISON:  02/16/2014  FINDINGS: Endotracheal and enteric tubes have been removed. Right jugular central venous catheter remains in place with tip projecting over the lower SVC. Cardiac silhouette is upper limits normal in size, unchanged. Hazy right basilar parenchymal opacity is unchanged. No pleural effusion or pneumothorax is identified.  IMPRESSION: Interval removal of endotracheal and enteric tubes. Persistent right basilar opacity suspicious for pneumonia.   Electronically Signed   By: Logan Bores   On: 02/17/2014 11:11   Dg Chest Port 1 View  02/16/2014   CLINICAL DATA:  Endotracheal tube placement.  EXAM: PORTABLE CHEST - 1 VIEW  COMPARISON:  February 15, 2014.  FINDINGS: Cardiomediastinal silhouette appears normal. Endotracheal tube is in grossly good position with distal tip 4 cm above the carina. Nasogastric tube is seen entering the stomach. No pneumothorax or significant pleural effusion is noted. Increased right basilar opacity is noted concerning for edema or pneumonia. Right internal jugular catheter line is noted with distal tip in expected position of the cavoatrial junction. Bony thorax is intact.   IMPRESSION: Endotracheal tube in grossly good position. Increased right basilar opacity is noted medially concerning for worsening edema or pneumonia.   Electronically Signed   By: Sabino Dick M.D.   On: 02/16/2014 07:38   Dg Chest Port 1 View  02/15/2014   CLINICAL DATA:  Assess endotracheal tube positioning  EXAM: PORTABLE CHEST - 1 VIEW  COMPARISON:  Portable chest x-ray of February 14, 2014  FINDINGS: The lungs are mildly hyperinflated and clear. The heart and mediastinal structures are normal. There is no pleural effusion or pneumothorax. The endotracheal tube tip lies 3.2 cm above the crotch of the carina. The esophagogastric tube tip projects off the inferior margin of the image. The right internal jugular venous catheter tip lies in the midportion of the SVC. External pacemaker pads are present.  IMPRESSION: The support tubes and lines are in appropriate position. No acute cardiopulmonary abnormality is demonstrated.   Electronically Signed   By: David  Martinique   On: 02/15/2014 07:16   Dg Chest Port 1 View  02/14/2014   CLINICAL DATA:  Central line placement.  EXAM: PORTABLE CHEST - 1 VIEW  COMPARISON:  02/13/2014  FINDINGS: Interval placement of a right central venous catheter with tip over the cavoatrial junction. No pneumothorax. Endotracheal tube appears to the advanced slightly with tip now the origin of the right mainstem bronchus. New placement of an enteric tube. The tip is off the field of view but below the left hemidiaphragm. Normal heart size and pulmonary vascularity. Lungs are clear and expanded.  IMPRESSION: Right central venous catheter tip localizes over the cavoatrial junction. No pneumothorax. Endotracheal tube tip now appears to reside in the origin of the right mainstem bronchus.  These results were called by telephone at the time of interpretation on 02/14/2014 at 1:12 am to the patient's nurse, [place], who verbally acknowledged these results.   Electronically Signed   By: Lucienne Capers M.D.   On: 02/14/2014 01:14   Dg Chest Port 1 View  02/13/2014   CLINICAL DATA:  Post CPR  EXAM: PORTABLE CHEST - 1 VIEW  COMPARISON:  Prior radiograph from 12/01/2013  FINDINGS: Defibrillator pads overlie the thorax. Patient is intubated with the tip of the endotracheal tube approximately 1 cm above the carina. Mediastinal silhouette within normal limits.  The lungs are normally inflated. No airspace consolidation, pleural effusion, or pulmonary edema is identified. There is no pneumothorax.  No acute osseous abnormality identified.  IMPRESSION: 1. Tip of endotracheal tube somewhat low lying just above the carina. Retraction by 2 cm could be considered to avoid slippage into the mainstem bronchi. 2. No acute cardiopulmonary abnormality identified. Results were called by telephone at the time of interpretation on 02/13/2014 at 9:13 pm to Dr. Claudean Severance , who verbally acknowledged these results.   Electronically Signed   By: Jeannine Boga M.D.   On: 02/13/2014 21:13   Dg Hand Complete Left  01/29/2014   CLINICAL DATA:  Traumatic injury and pain  EXAM: LEFT HAND - COMPLETE 3+ VIEW  COMPARISON:  None.  FINDINGS: Mild degenerate changes are noted at the first metacarpophalangeal joint. No acute fracture or dislocation is seen. No gross soft tissue abnormality is noted.  IMPRESSION: Degenerative change without acute abnormality.   Electronically Signed   By: Inez Catalina M.D.   On: 01/29/2014 12:39   Dg Swallowing Func-speech Pathology  02/20/2014   Katherene Ponto Deblois, CCC-SLP     02/20/2014 10:51 AM Objective Swallowing Evaluation: Modified Barium Swallowing Study   Patient Details  Name: GEORGANA ROMAIN MRN: [id number] Date of Birth: 05-10-1976  Today's Date: 02/20/2014 Time: 1015-1035 SLP Time Calculation (min): 20 min  Past Medical History:  Past Medical History  Diagnosis Date  . Proctitis   . Cysts of both ovaries   . Seizures   . Anemia   . Anxiety   .  Blood transfusion without reported  diagnosis   . Depression   . Fatty liver 10/05/13   Past Surgical History:  Past Surgical History  Procedure Laterality Date  . Ovarian cyst removal    . Laparoscopy N/A 09/28/2013    Procedure: LAPAROSCOPY OPERATIVE;  Surgeon: Terrance Mass,  MD;  Location: [facility];  Service: Gynecology;  Laterality: N/A;  . Laparoscopic appendectomy Right 09/28/2013    Procedure: APPENDECTOMY LAPAROSCOPIC;  Surgeon: Terrance Mass, MD;  Location: [facility];  Service: Gynecology;   Laterality: Right;  . Salpingoophorectomy Right 09/28/2013    Procedure: SALPINGO OOPHORECTOMY;  Surgeon: Terrance Mass,  MD;  Location: [facility];  Service: Gynecology;  Laterality: Right;  . Colonoscopy N/A 09/30/2013    Procedure: COLONOSCOPY;  Surgeon: Lafayette Dragon, MD;  Location:  [facility];  Service: Endoscopy;  Laterality: N/A;  . Esophagogastroduodenoscopy N/A 11/23/2013    Procedure: ESOPHAGOGASTRODUODENOSCOPY (EGD);  Surgeon: Jerene Bears, MD;  Location: [facility];  Service: Endoscopy;   Laterality: N/A;  . Appendectomy     HPI:  38 y.o. female with hx of alcohol abuse admitted to ED 7/27after  having seizure at home. In ED, found to be hypomagnesemic,  hypokalemic, hypocalcemic. Neurology consulted, concern that  seizure like activity is in fact myoclonus, have ordered  continuous EEG to r/o status. Intubated 7/27, extubated 7/31 (4  days). CXR 8/1 revealed R basilar opacity suspicious for PNA. Pt  now lethargic and agitated, likely alcohol withdrawal- sedation.  Bedside swallow eval ordered to evaluate swallow function post-  extubation.     Assessment / Plan / Recommendation Clinical Impression  Dysphagia Diagnosis: Moderate pharyngeal phase dysphagia Clinical impression: Pt demonstrates a moderate oropharyngeal  dysphagia characterized by decreased airway closure during the  swallow, likely due to inflmmation of glottis or arytenoids  follwing intubation (very hoarse vocal quality). The pt had  silent aspiration during the swallow with  small teaspoon nectar  boluses and sensed, gross aspiration of thin liquids during the  swallow with straw sips. When cued to tuck her chin the pt  protects her airway during the swallow completely and can consume  any texture with this method. Given that pt grimaces with regular  solids, recommend she initiate a dys 3 (mechcanical soft diet)  with thin liquids first. Pt will need full supervision for a chin  tuck with all POs until she is reliable. SLP will f/u for  tolerance and therapy. (give pills whole in puree).     Treatment Recommendation  Therapy as outlined in treatment plan below    Diet Recommendation Dysphagia 3 (Mechanical Soft);Thin liquid   Liquid Administration via: Straw Medication Administration: Whole meds with puree Supervision: Patient able to self feed;Full supervision/cueing  for compensatory strategies Compensations: Slow rate;Small sips/bites Postural Changes and/or Swallow Maneuvers: Chin tuck;Seated  upright 90 degrees;Out of bed for meals    Other  Recommendations Oral Care Recommendations: Oral care BID   Follow Up Recommendations  24 hour supervision/assistance    Frequency and Duration min 2x/week  2 weeks   Pertinent Vitals/Pain NA    SLP Swallow Goals     General HPI: 38 y.o. female with hx of alcohol abuse admitted to  ED 7/27after having seizure at home. In ED, found to be  hypomagnesemic, hypokalemic, hypocalcemic. Neurology consulted,  concern that seizure like activity is in fact myoclonus, have  ordered continuous EEG to r/o status. Intubated 7/27, extubated  7/31 (4 days). CXR 8/1 revealed  R basilar opacity suspicious for  PNA. Pt now lethargic and agitated, likely alcohol withdrawal-  sedation. Bedside swallow eval ordered to evaluate swallow  function post- extubation. Type of Study: Modified Barium Swallowing Study Reason for Referral: Objectively evaluate swallowing function Diet Prior to this Study: NPO Temperature Spikes Noted: No History of Recent Intubation: Yes  Length of Intubations (days): 4 days Date extubated: 02/16/14 Behavior/Cognition: Alert;Cooperative;Requires cueing;Pleasant  mood Oral Cavity - Dentition: Adequate natural dentition Oral Motor / Sensory Function: Within functional limits Self-Feeding Abilities: Able to feed self Patient Positioning: Upright in chair Baseline Vocal Quality: Breathy;Hoarse Volitional Cough: Weak;Congested Volitional Swallow: Able to elicit Anatomy: Within functional limits Pharyngeal Secretions: Not observed secondary MBS    Reason for Referral Objectively evaluate swallowing function   Oral Phase Oral Preparation/Oral Phase Oral Phase: WFL   Pharyngeal Phase Pharyngeal Phase Pharyngeal Phase: Impaired Pharyngeal - Nectar Pharyngeal - Nectar Teaspoon: Delayed swallow  initiation;Penetration/Aspiration during swallow;Trace  aspiration;Reduced airway/laryngeal closure Penetration/Aspiration details (nectar teaspoon): Material enters  airway, passes BELOW cords without attempt by patient to eject  out (silent aspiration) Pharyngeal - Nectar Straw: Delayed swallow initiation (chin tuck) Pharyngeal - Thin Pharyngeal - Thin Teaspoon: Delayed swallow initiation (chin  tuck) Pharyngeal - Thin Straw: Delayed swallow  initiation;Penetration/Aspiration during swallow;Reduced  airway/laryngeal closure;Significant aspiration (Amount)  (aspiration x1 without chin tuck) Penetration/Aspiration details (thin straw): Material enters  airway, passes BELOW cords and not ejected out despite cough  attempt by patient Pharyngeal - Solids Pharyngeal - Puree: Delayed swallow initiation Pharyngeal - Regular: Delayed swallow initiation Pharyngeal - Pill: Delayed swallow initiation  Cervical Esophageal Phase    GO    Cervical Esophageal Phase Cervical Esophageal Phase: Impaired Cervical Esophageal Phase - Comment Cervical Esophageal Comment: pill lodged at cervical esophagus,  tranited with f/u puree bolus        Herbie Baltimore, MA CCC-SLP [phone]  Lynann Beaver 02/20/2014, 10:49 AM     Microbiology: No results found for this or any previous visit (from the past 240 hour(s)).   Labs: Basic Metabolic Panel:  Recent Labs Lab 02/20/14 1909 02/21/14 0400 02/22/14 0535 02/22/14 1105 02/23/14 1315 02/25/14 0340 02/26/14 0505 02/27/14 0400  NA  --  139 140  --  140  --   --   --   K 3.7 3.3* 4.7  --  4.0  --   --   --   CL  --  105 105  --  102  --   --   --   CO2  --  23 26  --  26  --   --   --   GLUCOSE  --  121* 89  --  83  --   --   --   BUN  --  <3* <3*  --  <3*  --   --   --   CREATININE  --  0.31* 0.34*  --  0.38*  --   --   --   CALCIUM  --  7.7* 8.0*  --  8.1*  --   --   --   MG 1.6 1.5 1.7  --  1.7 2.2 2.1 2.0  PHOS  --   --   --  3.7  --   --   --   --    Liver Function Tests:  Recent Labs Lab 02/22/14 0535  AST 39*  ALT 16  ALKPHOS 110  BILITOT 0.5  PROT 5.2*  ALBUMIN 2.4*   No  results found for this basename: LIPASE, AMYLASE,  in the last 168 hours  Recent Labs Lab 02/22/14 0500  AMMONIA 25   CBC:  Recent Labs Lab 02/21/14 0400 02/22/14 0535 02/23/14 0839 02/23/14 1315  WBC 4.1 3.9* 4.0 2.8*  HGB 7.4* 7.8* 8.4* 7.6*  HCT 22.8* 24.0* 26.9* 23.7*  MCV 91.6 91.6 93.1 92.2  PLT 240 364 553* 486*   Cardiac Enzymes: No results found for this basename: CKTOTAL, CKMB, CKMBINDEX, TROPONINI,  in the last 168 hours BNP: BNP (last 3 results)  Recent Labs  11/28/13 2059  PROBNP 39.4   CBG:  Recent Labs Lab 02/20/14 2016 02/21/14 0803  GLUCAP 106* 91       Signed:  FELIZ ORTIZ, Amaka Gluth  [facility] 02/27/2014, 5:23 PM

## 2014-03-02 ENCOUNTER — Telehealth: Payer: Self-pay

## 2014-03-02 NOTE — Telephone Encounter (Signed)
Placed call to patient for follow-up on condition as she was recently discharged from hospital on 02/27/14. Patient scheduled to see Dr. Doreene Burke on 03/06/14 at 1130 and establish care.  Appointment made for SW appointment as SW following patient as well.  Unable to reach patient by phone.

## 2014-03-06 ENCOUNTER — Ambulatory Visit: Payer: Self-pay | Attending: Internal Medicine | Admitting: Internal Medicine

## 2014-03-06 ENCOUNTER — Encounter: Payer: Self-pay | Admitting: Internal Medicine

## 2014-03-06 ENCOUNTER — Ambulatory Visit: Payer: Self-pay | Attending: Internal Medicine | Admitting: *Deleted

## 2014-03-06 VITALS — BP 89/54 | HR 82 | Temp 97.9°F | Resp 18 | Ht 66.0 in | Wt 126.6 lb

## 2014-03-06 DIAGNOSIS — N83209 Unspecified ovarian cyst, unspecified side: Secondary | ICD-10-CM | POA: Insufficient documentation

## 2014-03-06 DIAGNOSIS — Z880 Allergy status to penicillin: Secondary | ICD-10-CM | POA: Insufficient documentation

## 2014-03-06 DIAGNOSIS — Z885 Allergy status to narcotic agent status: Secondary | ICD-10-CM | POA: Insufficient documentation

## 2014-03-06 DIAGNOSIS — Z599 Problem related to housing and economic circumstances, unspecified: Secondary | ICD-10-CM

## 2014-03-06 DIAGNOSIS — I959 Hypotension, unspecified: Secondary | ICD-10-CM | POA: Insufficient documentation

## 2014-03-06 DIAGNOSIS — F411 Generalized anxiety disorder: Secondary | ICD-10-CM | POA: Insufficient documentation

## 2014-03-06 DIAGNOSIS — K700 Alcoholic fatty liver: Secondary | ICD-10-CM | POA: Insufficient documentation

## 2014-03-06 DIAGNOSIS — F3289 Other specified depressive episodes: Secondary | ICD-10-CM | POA: Insufficient documentation

## 2014-03-06 DIAGNOSIS — F32A Depression, unspecified: Secondary | ICD-10-CM

## 2014-03-06 DIAGNOSIS — D649 Anemia, unspecified: Secondary | ICD-10-CM | POA: Insufficient documentation

## 2014-03-06 DIAGNOSIS — I252 Old myocardial infarction: Secondary | ICD-10-CM | POA: Insufficient documentation

## 2014-03-06 DIAGNOSIS — R45851 Suicidal ideations: Secondary | ICD-10-CM

## 2014-03-06 DIAGNOSIS — F1010 Alcohol abuse, uncomplicated: Secondary | ICD-10-CM | POA: Insufficient documentation

## 2014-03-06 DIAGNOSIS — R569 Unspecified convulsions: Secondary | ICD-10-CM | POA: Insufficient documentation

## 2014-03-06 DIAGNOSIS — K6289 Other specified diseases of anus and rectum: Secondary | ICD-10-CM | POA: Insufficient documentation

## 2014-03-06 DIAGNOSIS — Z886 Allergy status to analgesic agent status: Secondary | ICD-10-CM | POA: Insufficient documentation

## 2014-03-06 DIAGNOSIS — F329 Major depressive disorder, single episode, unspecified: Secondary | ICD-10-CM

## 2014-03-06 NOTE — Patient Instructions (Signed)

## 2014-03-06 NOTE — Progress Notes (Signed)
Patient presents to establish care HFU for MI C/O constant chest pain since waking in hospital. Rates 7/10 at present Also, c/o difficulty with speech And right-sided trembling

## 2014-03-06 NOTE — Progress Notes (Signed)
LCSW met with patient in order to assess current psychosocial needs. Patient stated that she has several challenges but is working through them. LCSW is working with patient to support forward progress toward these goals.  Patient identified challenges with managing her divorce and visitation issues.  LCSW recommended that patient contact [facility], LCSW provided the contact information.  Patient also stated that she was encouraged to apply for disability, LCSW provided patient with contact information for [facility] office and a local attorney who serves as an advocate.  Also patient stated that she is having some emotional challenges but has a psychiatrist with [facility].  LCSW recommended that patient contact [facility] in order to initiate counseling because she is currently getting services there.  LCSW encouraged patient to stay connected with this clinic for ongoing support.  Christene Lye MSW, LCSW

## 2014-03-13 ENCOUNTER — Emergency Department (HOSPITAL_COMMUNITY)
Admission: EM | Admit: 2014-03-13 | Discharge: 2014-03-14 | Disposition: A | Discharge status: Home / Self Care | Payer: Self-pay | Attending: Emergency Medicine | Admitting: Emergency Medicine

## 2014-03-13 ENCOUNTER — Encounter (HOSPITAL_COMMUNITY): Payer: Self-pay | Admitting: Emergency Medicine

## 2014-03-13 ENCOUNTER — Emergency Department (HOSPITAL_COMMUNITY): Payer: Self-pay

## 2014-03-13 DIAGNOSIS — G40909 Epilepsy, unspecified, not intractable, without status epilepticus: Secondary | ICD-10-CM | POA: Insufficient documentation

## 2014-03-13 DIAGNOSIS — Z8742 Personal history of other diseases of the female genital tract: Secondary | ICD-10-CM | POA: Insufficient documentation

## 2014-03-13 DIAGNOSIS — S2220XD Unspecified fracture of sternum, subsequent encounter for fracture with routine healing: Secondary | ICD-10-CM

## 2014-03-13 DIAGNOSIS — F329 Major depressive disorder, single episode, unspecified: Secondary | ICD-10-CM | POA: Insufficient documentation

## 2014-03-13 DIAGNOSIS — R945 Abnormal results of liver function studies: Secondary | ICD-10-CM

## 2014-03-13 DIAGNOSIS — F411 Generalized anxiety disorder: Secondary | ICD-10-CM | POA: Insufficient documentation

## 2014-03-13 DIAGNOSIS — F3289 Other specified depressive episodes: Secondary | ICD-10-CM | POA: Insufficient documentation

## 2014-03-13 DIAGNOSIS — D649 Anemia, unspecified: Secondary | ICD-10-CM | POA: Insufficient documentation

## 2014-03-13 DIAGNOSIS — F1020 Alcohol dependence, uncomplicated: Secondary | ICD-10-CM | POA: Insufficient documentation

## 2014-03-13 DIAGNOSIS — F10229 Alcohol dependence with intoxication, unspecified: Secondary | ICD-10-CM

## 2014-03-13 DIAGNOSIS — Z79899 Other long term (current) drug therapy: Secondary | ICD-10-CM | POA: Insufficient documentation

## 2014-03-13 DIAGNOSIS — I252 Old myocardial infarction: Secondary | ICD-10-CM | POA: Insufficient documentation

## 2014-03-13 DIAGNOSIS — Z880 Allergy status to penicillin: Secondary | ICD-10-CM | POA: Insufficient documentation

## 2014-03-13 DIAGNOSIS — IMO0001 Reserved for inherently not codable concepts without codable children: Secondary | ICD-10-CM | POA: Insufficient documentation

## 2014-03-13 DIAGNOSIS — R7989 Other specified abnormal findings of blood chemistry: Secondary | ICD-10-CM | POA: Insufficient documentation

## 2014-03-13 DIAGNOSIS — Z8719 Personal history of other diseases of the digestive system: Secondary | ICD-10-CM | POA: Insufficient documentation

## 2014-03-13 DIAGNOSIS — R079 Chest pain, unspecified: Secondary | ICD-10-CM | POA: Insufficient documentation

## 2014-03-13 LAB — COMPREHENSIVE METABOLIC PANEL
ALT: 28 U/L (ref 0–35)
AST: 43 U/L — AB (ref 0–37)
Albumin: 3.7 g/dL (ref 3.5–5.2)
Alkaline Phosphatase: 122 U/L — ABNORMAL HIGH (ref 39–117)
Anion gap: 16 — ABNORMAL HIGH (ref 5–15)
BUN: 8 mg/dL (ref 6–23)
CHLORIDE: 104 meq/L (ref 96–112)
CO2: 23 meq/L (ref 19–32)
CREATININE: 0.38 mg/dL — AB (ref 0.50–1.10)
Calcium: 8.7 mg/dL (ref 8.4–10.5)
GLUCOSE: 95 mg/dL (ref 70–99)
Potassium: 3.8 mEq/L (ref 3.7–5.3)
Sodium: 143 mEq/L (ref 137–147)
Total Bilirubin: 0.6 mg/dL (ref 0.3–1.2)
Total Protein: 7.1 g/dL (ref 6.0–8.3)

## 2014-03-13 LAB — MAGNESIUM: Magnesium: 1.7 mg/dL (ref 1.5–2.5)

## 2014-03-13 LAB — CBC WITH DIFFERENTIAL/PLATELET
BASOS ABS: 0 10*3/uL (ref 0.0–0.1)
Basophils Relative: 1 % (ref 0–1)
EOS ABS: 0 10*3/uL (ref 0.0–0.7)
Eosinophils Relative: 0 % (ref 0–5)
HCT: 31.3 % — ABNORMAL LOW (ref 36.0–46.0)
Hemoglobin: 10.2 g/dL — ABNORMAL LOW (ref 12.0–15.0)
LYMPHS PCT: 50 % — AB (ref 12–46)
Lymphs Abs: 1.7 10*3/uL (ref 0.7–4.0)
MCH: 29 pg (ref 26.0–34.0)
MCHC: 32.6 g/dL (ref 30.0–36.0)
MCV: 88.9 fL (ref 78.0–100.0)
Monocytes Absolute: 0.2 10*3/uL (ref 0.1–1.0)
Monocytes Relative: 6 % (ref 3–12)
Neutro Abs: 1.4 10*3/uL — ABNORMAL LOW (ref 1.7–7.7)
Neutrophils Relative %: 43 % (ref 43–77)
PLATELETS: 230 10*3/uL (ref 150–400)
RBC: 3.52 MIL/uL — ABNORMAL LOW (ref 3.87–5.11)
RDW: 16.5 % — AB (ref 11.5–15.5)
WBC: 3.3 10*3/uL — AB (ref 4.0–10.5)

## 2014-03-13 LAB — I-STAT CHEM 8, ED
BUN: 5 mg/dL — AB (ref 6–23)
CALCIUM ION: 1.01 mmol/L — AB (ref 1.12–1.23)
Chloride: 106 mEq/L (ref 96–112)
Creatinine, Ser: 0.8 mg/dL (ref 0.50–1.10)
Glucose, Bld: 98 mg/dL (ref 70–99)
HCT: 35 % — ABNORMAL LOW (ref 36.0–46.0)
Hemoglobin: 11.9 g/dL — ABNORMAL LOW (ref 12.0–15.0)
Potassium: 3.6 mEq/L — ABNORMAL LOW (ref 3.7–5.3)
Sodium: 140 mEq/L (ref 137–147)
TCO2: 23 mmol/L (ref 0–100)

## 2014-03-13 LAB — I-STAT TROPONIN, ED: Troponin i, poc: 0 ng/mL (ref 0.00–0.08)

## 2014-03-13 LAB — ETHANOL: ALCOHOL ETHYL (B): 351 mg/dL — AB (ref 0–11)

## 2014-03-13 MED ORDER — LORAZEPAM 1 MG PO TABS: 1.0000 mg | ORAL_TABLET | Freq: Once | ORAL | Status: DC

## 2014-03-13 MED ORDER — POTASSIUM CHLORIDE 10 MEQ/100ML IV SOLN
10.0000 meq | Freq: Once | INTRAVENOUS | Status: AC
  Administered 2014-03-13: 10 meq via INTRAVENOUS
  Filled 2014-03-13: qty 100

## 2014-03-13 MED ORDER — HYDROMORPHONE HCL PF 1 MG/ML IJ SOLN: 1.0000 mg | Freq: Once | INTRAMUSCULAR | Status: DC

## 2014-03-13 MED ORDER — KETOROLAC TROMETHAMINE 30 MG/ML IJ SOLN
30.0000 mg | Freq: Once | INTRAMUSCULAR | Status: AC
  Administered 2014-03-13: 30 mg via INTRAVENOUS
  Filled 2014-03-13: qty 1

## 2014-03-13 MED ORDER — HYDROMORPHONE HCL PF 1 MG/ML IJ SOLN
1.0000 mg | Freq: Once | INTRAMUSCULAR | Status: AC
Start: 2014-03-13 — End: 2014-03-13
  Administered 2014-03-13: 1 mg via INTRAVENOUS
  Filled 2014-03-13: qty 1

## 2014-03-13 MED ORDER — POTASSIUM CHLORIDE 20 MEQ/15ML (10%) PO LIQD
40.0000 meq | Freq: Once | ORAL | Status: AC
  Administered 2014-03-13: 40 meq via ORAL
  Filled 2014-03-13: qty 30

## 2014-03-13 MED ORDER — MAGNESIUM SULFATE 40 MG/ML IJ SOLN
2.0000 g | Freq: Once | INTRAMUSCULAR | Status: AC
  Administered 2014-03-13: 2 g via INTRAVENOUS
  Filled 2014-03-13: qty 50

## 2014-03-13 NOTE — ED Notes (Signed)
Family at bedside. 

## 2014-03-13 NOTE — ED Notes (Signed)
According to EMS,the patient was seen here, status post cardiac arrest.  She presents today with chest wall pain, SOB, Nausea, and says she had a seizure prior to coming to the ED.  Patient not post-ictal here or prior to arrival.  EMS placed a 20gauge but no medication was given.  EMS also advised she had "LOC" for about 30seconds.  They also said she was squeezing her eyes shut.  Upon her arrival, she was refusing to have her EKG done.

## 2014-03-13 NOTE — ED Provider Notes (Signed)
CSN: [id number]     Arrival date & time 03/13/14  1655 History   First MD Initiated Contact with Patient 03/13/14 1657     Chief Complaint  Patient presents with  . Chest Pain    The patient was seen here, status post cardiac arrest.  She presents today with chest wall pain, SOB, Nausea, and says she had a seizure prior to coming to the ED.  Patient not post-ictal here or prior to arrival     (Consider location/radiation/quality/duration/timing/severity/associated sxs/prior Treatment) HPI  Kristen Roberson is a 38 y.o. female with a past medical history of seizures, alcohol abuse, anxiety, and previous cardiac arrest, presenting for chest pain. Patient sustained cardiac arrest approximately a month ago, secondary to electrolyte abnormalities induced from heavy EtOH use, and likely withdrawal seizure. Patient has had persistent chest wall pain since being discharged from the hospital, which route she was told was due to her receiving CPR and chest compressions. Patient denies any EtOH or drug use today. States she is "in a lot of pain." She has difficulty describing her pain her symptoms further due to distress. She is not sure what to do because "everything hurts" her chest. Denies shortness of breath, nausea, vomiting, diaphoresis, abdominal pain, cough, fevers, or chills.     Past Medical History  Diagnosis Date  . Proctitis   . Cysts of both ovaries   . Seizures   . Anemia   . Anxiety   . Blood transfusion without reported diagnosis   . Depression   . Fatty liver 10/05/13   Past Surgical History  Procedure Laterality Date  . Ovarian cyst removal    . Laparoscopy N/A 09/28/2013    Procedure: LAPAROSCOPY OPERATIVE;  Surgeon: Terrance Mass, MD;  Location: [facility];  Service: Gynecology;  Laterality: N/A;  . Laparoscopic appendectomy Right 09/28/2013    Procedure: APPENDECTOMY LAPAROSCOPIC;  Surgeon: Terrance Mass, MD;  Location: [facility];  Service: Gynecology;  Laterality: Right;  .  Salpingoophorectomy Right 09/28/2013    Procedure: SALPINGO OOPHORECTOMY;  Surgeon: Terrance Mass, MD;  Location: [facility];  Service: Gynecology;  Laterality: Right;  . Colonoscopy N/A 09/30/2013    Procedure: COLONOSCOPY;  Surgeon: Lafayette Dragon, MD;  Location: [facility];  Service: Endoscopy;  Laterality: N/A;  . Esophagogastroduodenoscopy N/A 11/23/2013    Procedure: ESOPHAGOGASTRODUODENOSCOPY (EGD);  Surgeon: Jerene Bears, MD;  Location: [facility];  Service: Endoscopy;  Laterality: N/A;  . Appendectomy     Family History  Problem Relation Age of Onset  . Diabetes Mother   . Hyperlipidemia Mother   . Stroke Mother   . Diabetes Father    History  Substance Use Topics  . Smoking status: Never Smoker   . Smokeless tobacco: Never Used  . Alcohol Use: Yes   OB History   Grav Para Term Preterm Abortions TAB SAB Ect Mult Living   7 3   4  4   3      Review of Systems  Constitutional: Negative.   HENT: Negative.   Eyes: Negative.   Respiratory: Negative.   Cardiovascular: Positive for chest pain.  Gastrointestinal: Negative.   Endocrine: Negative.   Genitourinary: Negative.   Musculoskeletal: Negative.   Skin: Negative.   Allergic/Immunologic: Negative.   Neurological: Negative.   Hematological: Negative.   Psychiatric/Behavioral: Negative.       Allergies  Morphine and related; Tramadol; and Penicillins  Home Medications   Prior to Admission medications   Medication Sig Start  Date End Date Taking? Authorizing Provider  acetaminophen (TYLENOL) 325 MG tablet Take 650 mg by mouth every 6 (six) hours as needed for moderate pain.   Yes Historical Provider, MD  carvedilol (COREG) 6.25 MG tablet Take 6.25 mg by mouth 2 (two) times daily with a meal. 02/27/14  Yes Charlynne Cousins, MD  FLUoxetine (PROZAC) 20 MG capsule Take 40 mg by mouth daily. 01/09/14  Yes Benjamine Mola, FNP  iron polysaccharides (NIFEREX) 150 MG capsule Take 150 mg by mouth daily. 02/27/14  Yes  Charlynne Cousins, MD  levETIRAcetam (KEPPRA) 1000 MG tablet Take 1,000 mg by mouth 2 (two) times daily. 02/27/14  Yes Charlynne Cousins, MD  magnesium oxide (MAG-OX) 400 (241.3 MG) MG tablet Take 1 tablet (400 mg total) by mouth 3 (three) times daily. 02/27/14  Yes Charlynne Cousins, MD  cyclobenzaprine (FLEXERIL) 5 MG tablet Take 1 tablet (5 mg total) by mouth 3 (three) times daily as needed for muscle spasms. 03/14/14   Hoyle Sauer, MD  HYDROcodone-acetaminophen (NORCO/VICODIN) 5-325 MG per tablet Take 2 tablets by mouth every 4 (four) hours as needed for moderate pain or severe pain. 03/14/14   Hoyle Sauer, MD  ibuprofen (ADVIL,MOTRIN) 400 MG tablet Take 1 tablet (400 mg total) by mouth every 6 (six) hours as needed. 03/14/14   Hoyle Sauer, MD   BP 116/71  Pulse 64  Temp(Src) 98.1 F (36.7 C) (Oral)  Resp 18  SpO2 100%  LMP 02/18/2014 Physical Exam  Nursing note and vitals reviewed. Constitutional: She is oriented to person, place, and time. She appears well-developed and well-nourished. She appears distressed.  HENT:  Head: Normocephalic and atraumatic.  Right Ear: External ear normal.  Left Ear: External ear normal.  Nose: Nose normal.  Mouth/Throat: Oropharynx is clear and moist. No oropharyngeal exudate.  Eyes: Conjunctivae and EOM are normal. Pupils are equal, round, and reactive to light. Right eye exhibits no discharge. Left eye exhibits no discharge. No scleral icterus.  Neck: Normal range of motion. Neck supple. No JVD present. No tracheal deviation present. No thyromegaly present.  Cardiovascular: Normal rate, regular rhythm, normal heart sounds and intact distal pulses.  Exam reveals no gallop and no friction rub.   No murmur heard. Pulmonary/Chest: Effort normal and breath sounds normal. No stridor. No respiratory distress. She has no wheezes. She has no rales. She exhibits tenderness.    Abdominal: Soft. Bowel sounds are normal. She exhibits no distension.  There is no tenderness. There is no rebound and no guarding.  Musculoskeletal: Normal range of motion. She exhibits no edema and no tenderness.  Lymphadenopathy:    She has no cervical adenopathy.  Neurological: She is alert and oriented to person, place, and time. She has normal reflexes.  Skin: Skin is warm and dry. No rash noted. She is not diaphoretic. No erythema. No pallor.  Psychiatric: Her behavior is normal. Judgment and thought content normal. Her mood appears anxious.    ED Course  Procedures (including critical care time) Labs Review Labs Reviewed  COMPREHENSIVE METABOLIC PANEL - Abnormal; Notable for the following:    Creatinine, Ser 0.38 (*)    AST 43 (*)    Alkaline Phosphatase 122 (*)    Anion gap 16 (*)    All other components within normal limits  CBC WITH DIFFERENTIAL - Abnormal; Notable for the following:    WBC 3.3 (*)    RBC 3.52 (*)    Hemoglobin 10.2 (*)    HCT 31.3 (*)  RDW 16.5 (*)    Neutro Abs 1.4 (*)    Lymphocytes Relative 50 (*)    All other components within normal limits  ETHANOL - Abnormal; Notable for the following:    Alcohol, Ethyl (B) 351 (*)    All other components within normal limits  I-STAT CHEM 8, ED - Abnormal; Notable for the following:    Potassium 3.6 (*)    BUN 5 (*)    Calcium, Ion 1.01 (*)    Hemoglobin 11.9 (*)    HCT 35.0 (*)    All other components within normal limits  MAGNESIUM  I-STAT TROPOININ, ED    Imaging Review Dg Chest 2 View  03/13/2014   CLINICAL DATA:  Chest pain with shortness of breath and nausea. Recent cardiac arrest. Seizures today.  EXAM: CHEST  2 VIEW  COMPARISON:  02/19/2014 and 02/17/2014 as well as 12/01/2013  FINDINGS: Lungs are clear. Cardiomediastinal silhouette is within normal. There is a fracture of the mid sternal body not seen on the prior lateral chest x-ray from 12/01/2013. Remainder of the exam is unchanged.  IMPRESSION: No acute cardiopulmonary disease.  Fracture of the mid body of the  sternum new since 12/01/2013.   Electronically Signed   By: Marin Olp M.D.   On: 03/13/2014 18:59     EKG Interpretation   Date/Time:  Tuesday March 13 2014 16:57:54 EDT Ventricular Rate:  71 PR Interval:  137 QRS Duration: 93 QT Interval:  430 QTC Calculation: 467 R Axis:   74 Text Interpretation:  Age not entered, assumed to be  38 years old for  purpose of ECG interpretation Sinus rhythm Normal ECG When compared with  ECG of 02/24/2014, No significant change was found Confirmed by [facility]  MD,  DAVID ([id number]) on 03/13/2014 5:10:15 PM      MDM   Final diagnoses:  Sternal fracture, with routine healing, subsequent encounter  Status post myocardial infarction  Alcohol dependence with intoxication with complication  Elevated LFTs    Patient's chest pain is reproducible palpation, low concern for ACS as patient had a recent cardiac cath did which did not show coronary disease. Patient's prior cardiac arrest was related to electrode abnormalities, so will check her electrolytes. Patient denies recent EtOH use but appears to be under the influence of EtOH at this time.  Lab workup significant for mildly reduced potassium and magnesium, will administer additional repletion. Patient EtOH level is 351. Chest x-ray lateral view demonstrates a healing sternal fracture from her prior cardiac arrest with chest compressions. Patient likely experiencing ongoing pain due to her sternal fracture. Following pain control emergency department, and time for EtOH metabolism, patient's symptoms improved. Advised on strategies for ongoing EtOH abstinence. Patient endorses having several glasses of wine earlier today. States that she has intermittently gone to [employer], and will plan to continue to do so as she knows that her EtOH use will lead to further medical problems.  Will provide appropriate prescription for pain control, and muscle relaxants for patient's sternal fracture. Advised to follow up  with PCP for further recommendations, including assessment for possible ongoing physical therapy.  Patient given return precautions for ETOH w/d and sternal fx.  Advised to return for worsening symptoms including chest pain, shortness of breath, severe headache, intractable nausea or vomiting, fever, or chills, inability to take medications, or other acute concerns.  Advised to follow up with PCP in 1 week.  Patient and family in agreement with and expressed understanding of follow  plan, plan of care, and return precautions.  All questions answered prior to discharge.  Patient was discharged in stable condition, ambulating without difficulty.  Patient care was discussed with my attending, Dr. Roxanne Mins.   Hoyle Sauer, MD 03/14/14 [phone]

## 2014-03-13 NOTE — ED Notes (Signed)
Patient transported to X-ray 

## 2014-03-13 NOTE — ED Provider Notes (Signed)
38 year old female who is one-month status post out of hospital cardiac arrest which was felt to be due to hypokalemia and hypomagnesemia. Since waking up following her cardiac arrest, she has had constant pain in the parasternal area with some radiation through to the back. She has difficulty describing the pain but rates it a 10/10. It is worse with movement and with breathing and with coughing and sneezing. She states the pain seems to be getting worse. She does feel somewhat short of breath but there is no nausea or vomiting. She's also had 2 seizures today. Her husband describes about 15 seconds of jerking followed by a period of unresponsiveness and then waking up. She has history of seizures and most of those have been felt to be due to alcohol withdrawal. However, she states that she had not been drinking since being in the hospital although she does admit to having had 3 glasses of wine daily. He denies other drug use. She's been taking acetaminophen for pain without relief. On exam, she is very anxious. Lungs are clear and heart has regular rate and rhythm. There is marked tenderness throughout the anterior chest wall but no deformity and ecchymosis. Neurologic exam is nonfocal. Her old records are reviewed and she had a cardiac catheterization following her cardiac arrest which showed normal coronary arteries. She initially had diminished ejection fraction but followup I should normal ejection fraction. Her pain is clearly chest wall pain and that she has been advised that this will take time since she had the trauma of CPR. Treatment will need to be symptomatic. She will need to discuss with her PCP whether to adjust her dose of levetiracetam.  I saw and evaluated the patient, reviewed the resident's note and I agree with the findings and plan.   EKG Interpretation   Date/Time:  Tuesday March 13 2014 16:57:54 EDT Ventricular Rate:  71 PR Interval:  137 QRS Duration: 93 QT Interval:  430 QTC  Calculation: 467 R Axis:   74 Text Interpretation:  Age not entered, assumed to be  38 years old for  purpose of ECG interpretation Sinus rhythm Normal ECG When compared with  ECG of 02/24/2014, No significant change was found Confirmed by [facility]  MD,  Panagiota Perfetti ([id number]) on 03/13/2014 5:10:15 PM       Delora Fuel, MD 41/28/78 6767

## 2014-03-14 ENCOUNTER — Ambulatory Visit: Payer: Self-pay | Admitting: Cardiology

## 2014-03-14 ENCOUNTER — Telehealth: Payer: Self-pay | Admitting: Emergency Medicine

## 2014-03-14 MED ORDER — LORAZEPAM 1 MG PO TABS
1.0000 mg | ORAL_TABLET | Freq: Once | ORAL | Status: AC
  Administered 2014-03-14: 1 mg via ORAL
  Filled 2014-03-14: qty 1

## 2014-03-14 MED ORDER — HYDROCODONE-ACETAMINOPHEN 5-325 MG PO TABS: 2.0000 | ORAL_TABLET | ORAL | Status: DC | PRN

## 2014-03-14 MED ORDER — CYCLOBENZAPRINE HCL 5 MG PO TABS: 5.0000 mg | ORAL_TABLET | Freq: Three times a day (TID) | ORAL | Status: DC | PRN

## 2014-03-14 MED ORDER — HYDROCODONE-ACETAMINOPHEN 5-325 MG PO TABS
2.0000 | ORAL_TABLET | Freq: Once | ORAL | Status: AC
  Administered 2014-03-14: 2 via ORAL
  Filled 2014-03-14: qty 2

## 2014-03-14 MED ORDER — IBUPROFEN 400 MG PO TABS: 400.0000 mg | ORAL_TABLET | Freq: Four times a day (QID) | ORAL | Status: DC | PRN

## 2014-03-14 NOTE — ED Notes (Signed)
Pt ambulating independently w/ steady gait on d/c in no acute distress, A&Ox4. D/c instructions reviewed w/ pt and family - pt and family deny any further questions or concerns at present. Rx given x3  

## 2014-03-14 NOTE — Telephone Encounter (Signed)
Left message to see if pt was coming for scheduled appointment

## 2014-03-14 NOTE — Progress Notes (Signed)
Patient ID: Kristen Roberson, female   DOB: 13-May-1976, 38 y.o.   MRN: [id number]   Kristen Roberson, is a 38 y.o. female  LOV:[id number]  OAC:[id number]  DOB - 1976/04/17  CC:  Chief Complaint  Patient presents with  . Hospitalization Follow-up  . Establish Care       HPI: Kristen Roberson is a 38 y.o. female here today to establish medical care. Patient has past medical history significant for alcohol abuse, major depression with multiple suicidal ideation in the past and chronic pelvic pain who recently suffered a witnessed ventricular fibrillation cardiac arrest while watching television with her boyfriend. The boyfriend initiated CPR and EMS continued CPR for 10 minutes with oral intubation with restoration of perfusing rhythm. 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 She was recently discharged from the hospital after a few days of rehabilitation. She is here today to establish medical care, her major complaint is ongoing tremor in all her limbs. She has quit alcohol completely, she claims her boyfriend has been supportive and now she is less depressed and no suicidal ideation or thoughts. She continues to feel dizzy and she has difficulty with speech. Her VEEG findings are consistent with a severe metabolic/toxic/anoxic encephalopathy on admission. She still has occasional chest pain. Patient has No headache, No abdominal pain - No Nausea, No new weakness tingling or numbness, No Cough - SOB.  Allergies  Allergen Reactions  . Morphine And Related Anaphylaxis     Tolerated hydromorphone on 11/25/13.   . Tramadol Other (See Comments)    Seizures   . Penicillins Other (See Comments)    Unknown childhood reaction.   Past Medical History  Diagnosis Date  . Proctitis   . Cysts of both ovaries   . Seizures   . Anemia   . Anxiety   . Blood transfusion without reported diagnosis   . Depression   . Fatty liver 10/05/13   Current  Outpatient Prescriptions on File Prior to Visit  Medication Sig Dispense Refill  . FLUoxetine (PROZAC) 20 MG capsule Take 40 mg by mouth daily.      . magnesium oxide (MAG-OX) 400 (241.3 MG) MG tablet Take 1 tablet (400 mg total) by mouth 3 (three) times daily.  60 tablet  0  . acetaminophen (TYLENOL) 325 MG tablet Take 650 mg by mouth every 6 (six) hours as needed for moderate pain.       No current facility-administered medications on file prior to visit.   Family History  Problem Relation Age of Onset  . Diabetes Mother   . Hyperlipidemia Mother   . Stroke Mother   . Diabetes Father    History   Social History  . Marital Status: Legally Separated    Spouse Name: N/A    Number of Children: N/A  . Years of Education: N/A   Occupational History  . Not on file.   Social History Main Topics  . Smoking status: Never Smoker   . Smokeless tobacco: Never Used  . Alcohol Use: Yes  . Drug Use: No  . Sexual Activity: Not on file   Other Topics Concern  . Not on file   Social History Narrative  . No narrative on file    Review of Systems: Constitutional: Negative for fever, chills, diaphoresis, activity change, appetite change and fatigue. HENT: Negative for ear pain, nosebleeds, congestion, facial swelling, rhinorrhea, neck pain, neck stiffness and ear discharge.  Eyes: Negative for pain, discharge,  redness, itching and visual disturbance. Respiratory: Negative for cough, choking, chest tightness, shortness of breath, wheezing and stridor.  Cardiovascular: Negative for chest pain, palpitations and leg swelling. Gastrointestinal: Negative for abdominal distention. Genitourinary: Negative for dysuria, urgency, frequency, hematuria, flank pain, decreased urine volume, difficulty urinating and dyspareunia.  Musculoskeletal: Negative for back pain, joint swelling, arthralgia and gait problem. Neurological: Negative for dizziness, tremors, seizures, syncope, facial asymmetry, speech  difficulty, weakness, light-headedness, numbness and headaches.  Hematological: Negative for adenopathy. Does not bruise/bleed easily. Psychiatric/Behavioral: Negative for hallucinations, behavioral problems, confusion, dysphoric mood, decreased concentration and agitation.    Objective:   Filed Vitals:   03/06/14 1111  BP: 89/54  Pulse: 82  Temp: 97.9 F (36.6 C)  Resp: 18    Physical Exam: Constitutional: Patient appears well-developed and well-nourished. No distress. HENT: Normocephalic, atraumatic, External right and left ear normal. Oropharynx is clear and moist.  Eyes: Conjunctivae and EOM are normal. PERRLA, no scleral icterus. Neck: Normal ROM. Neck supple. No JVD. No tracheal deviation. No thyromegaly. CVS: RRR, S1/S2 +, no murmurs, no gallops, no carotid bruit.  Pulmonary: Effort and breath sounds normal, no stridor, rhonchi, wheezes, rales.  Abdominal: Soft. BS +, no distension, tenderness, rebound or guarding.  Musculoskeletal: Normal range of motion. No edema and no tenderness.  Lymphadenopathy: No lymphadenopathy noted, cervical, inguinal or axillary Neuro: Alert. Normal reflexes, muscle tone coordination. No cranial nerve deficit. Bilateral tremble more pronounced on the right, may be from alcohol withdrawal versus sequelae of anoxic encephalopathy Skin: Skin is warm and dry. No rash noted. Not diaphoretic. No erythema. No pallor. Psychiatric: Normal mood and affect. Behavior, judgment, thought content normal.  Lab Results  Component Value Date   WBC 3.3* 03/13/2014   HGB 11.9* 03/13/2014   HCT 35.0* 03/13/2014   MCV 88.9 03/13/2014   PLT 230 03/13/2014   Lab Results  Component Value Date   CREATININE 0.80 03/13/2014   BUN 5* 03/13/2014   NA 140 03/13/2014   K 3.6* 03/13/2014   CL 106 03/13/2014   CO2 23 03/13/2014    Lab Results  Component Value Date   HGBA1C 4.4 12/09/2013   Lipid Panel  No results found for this basename: chol, trig, hdl, cholhdl, vldl,  ldlcalc       Assessment and plan:   1. Status post myocardial infarction  - Ambulatory referral to Neurology - Ambulatory referral to Cardiology - Ambulatory referral to Physical Therapy  2. Depression Patient counseled extensively Patient was given a referral to a Education officer, museum here today for counseling Continue Prozac 40 mg tablet by mouth daily  3. Hypotension Reduce the dose of carvedilol to 3.125 mg tablet by mouth twice a day instead of 6.25 mg twice a day Fall precautions discussed  Return in about 3 months (around 06/06/2014), or if symptoms worsen or fail to improve, for Annual Physical, Follow up Pain and comorbidities.  The patient was given clear instructions to go to ER or return to medical center if symptoms don't improve, worsen or new problems develop. The patient verbalized understanding. The patient was told to call to get lab results if they haven't heard anything in the next week.     This note has been created with Surveyor, quantity. Any transcriptional errors are unintentional.    Angelica Chessman, MD, [place], [place], [place], [place]   03/14/2014, 2:18 PM

## 2014-03-20 DEATH — deceased

## 2014-03-21 ENCOUNTER — Encounter (HOSPITAL_COMMUNITY): Payer: Self-pay | Admitting: Emergency Medicine

## 2014-03-21 ENCOUNTER — Emergency Department (HOSPITAL_COMMUNITY)
Admission: EM | Admit: 2014-03-21 | Discharge: 2014-03-21 | Disposition: A | Discharge status: Home / Self Care | Payer: Self-pay | Attending: Emergency Medicine | Admitting: Emergency Medicine

## 2014-03-21 ENCOUNTER — Emergency Department (HOSPITAL_COMMUNITY): Payer: Self-pay

## 2014-03-21 DIAGNOSIS — F3289 Other specified depressive episodes: Secondary | ICD-10-CM | POA: Insufficient documentation

## 2014-03-21 DIAGNOSIS — S42033A Displaced fracture of lateral end of unspecified clavicle, initial encounter for closed fracture: Secondary | ICD-10-CM | POA: Insufficient documentation

## 2014-03-21 DIAGNOSIS — Z8742 Personal history of other diseases of the female genital tract: Secondary | ICD-10-CM | POA: Insufficient documentation

## 2014-03-21 DIAGNOSIS — Z79899 Other long term (current) drug therapy: Secondary | ICD-10-CM | POA: Insufficient documentation

## 2014-03-21 DIAGNOSIS — F411 Generalized anxiety disorder: Secondary | ICD-10-CM | POA: Insufficient documentation

## 2014-03-21 DIAGNOSIS — Y9389 Activity, other specified: Secondary | ICD-10-CM | POA: Insufficient documentation

## 2014-03-21 DIAGNOSIS — Y9289 Other specified places as the place of occurrence of the external cause: Secondary | ICD-10-CM | POA: Insufficient documentation

## 2014-03-21 DIAGNOSIS — S46909A Unspecified injury of unspecified muscle, fascia and tendon at shoulder and upper arm level, unspecified arm, initial encounter: Secondary | ICD-10-CM | POA: Insufficient documentation

## 2014-03-21 DIAGNOSIS — F329 Major depressive disorder, single episode, unspecified: Secondary | ICD-10-CM | POA: Insufficient documentation

## 2014-03-21 DIAGNOSIS — S4980XA Other specified injuries of shoulder and upper arm, unspecified arm, initial encounter: Secondary | ICD-10-CM | POA: Insufficient documentation

## 2014-03-21 DIAGNOSIS — S42001A Fracture of unspecified part of right clavicle, initial encounter for closed fracture: Secondary | ICD-10-CM

## 2014-03-21 DIAGNOSIS — D649 Anemia, unspecified: Secondary | ICD-10-CM | POA: Insufficient documentation

## 2014-03-21 DIAGNOSIS — Z880 Allergy status to penicillin: Secondary | ICD-10-CM | POA: Insufficient documentation

## 2014-03-21 DIAGNOSIS — Z8719 Personal history of other diseases of the digestive system: Secondary | ICD-10-CM | POA: Insufficient documentation

## 2014-03-21 DIAGNOSIS — W010XXA Fall on same level from slipping, tripping and stumbling without subsequent striking against object, initial encounter: Secondary | ICD-10-CM | POA: Insufficient documentation

## 2014-03-21 DIAGNOSIS — Z9889 Other specified postprocedural states: Secondary | ICD-10-CM | POA: Insufficient documentation

## 2014-03-21 DIAGNOSIS — G40909 Epilepsy, unspecified, not intractable, without status epilepticus: Secondary | ICD-10-CM | POA: Insufficient documentation

## 2014-03-21 DIAGNOSIS — Z791 Long term (current) use of non-steroidal anti-inflammatories (NSAID): Secondary | ICD-10-CM | POA: Insufficient documentation

## 2014-03-21 MED ORDER — OXYCODONE-ACETAMINOPHEN 5-325 MG PO TABS
2.0000 | ORAL_TABLET | Freq: Once | ORAL | Status: AC
  Administered 2014-03-21: 2 via ORAL
  Filled 2014-03-21: qty 2

## 2014-03-21 MED ORDER — HYDROCODONE-ACETAMINOPHEN 5-325 MG PO TABS: 1.0000 | ORAL_TABLET | ORAL | Status: DC | PRN

## 2014-03-21 NOTE — ED Notes (Signed)
Patient transported to X-ray 

## 2014-03-21 NOTE — ED Provider Notes (Signed)
CSN: [id number]     Arrival date & time 03/21/14  9798 History   First MD Initiated Contact with Patient 03/21/14 1017     Chief Complaint  Patient presents with  . Shoulder Pain      HPI Patient presents with pain to her anterior right shoulder and clavicle region.  She states that she tripped over her dog 2 days ago and fell into the door frame.  She's had bruising and ongoing pain in this region since then.  She also reports mild right anterior chest discomfort is worse with palpation since the fall.  No head injury.  No neck pain.  No weakness of arms or legs.  Pain is moderate to severe in severity.  No other complaints   Past Medical History  Diagnosis Date  . Proctitis   . Cysts of both ovaries   . Seizures   . Anemia   . Anxiety   . Blood transfusion without reported diagnosis   . Depression   . Fatty liver 10/05/13   Past Surgical History  Procedure Laterality Date  . Ovarian cyst removal    . Laparoscopy N/A 09/28/2013    Procedure: LAPAROSCOPY OPERATIVE;  Surgeon: Terrance Mass, MD;  Location: [facility];  Service: Gynecology;  Laterality: N/A;  . Laparoscopic appendectomy Right 09/28/2013    Procedure: APPENDECTOMY LAPAROSCOPIC;  Surgeon: Terrance Mass, MD;  Location: [facility];  Service: Gynecology;  Laterality: Right;  . Salpingoophorectomy Right 09/28/2013    Procedure: SALPINGO OOPHORECTOMY;  Surgeon: Terrance Mass, MD;  Location: [facility];  Service: Gynecology;  Laterality: Right;  . Colonoscopy N/A 09/30/2013    Procedure: COLONOSCOPY;  Surgeon: Lafayette Dragon, MD;  Location: [facility];  Service: Endoscopy;  Laterality: N/A;  . Esophagogastroduodenoscopy N/A 11/23/2013    Procedure: ESOPHAGOGASTRODUODENOSCOPY (EGD);  Surgeon: Jerene Bears, MD;  Location: [facility];  Service: Endoscopy;  Laterality: N/A;  . Appendectomy     Family History  Problem Relation Age of Onset  . Diabetes Mother   . Hyperlipidemia Mother   . Stroke Mother   . Diabetes Father     History  Substance Use Topics  . Smoking status: Never Smoker   . Smokeless tobacco: Never Used  . Alcohol Use: Yes   OB History   Grav Para Term Preterm Abortions TAB SAB Ect Mult Living   7 3   4  4   3      Review of Systems  All other systems reviewed and are negative.     Allergies  Morphine and related; Tramadol; and Penicillins  Home Medications   Prior to Admission medications   Medication Sig Start Date End Date Taking? Authorizing Provider  acetaminophen (TYLENOL) 325 MG tablet Take 650 mg by mouth every 6 (six) hours as needed for moderate pain.   Yes Historical Provider, MD  carvedilol (COREG) 6.25 MG tablet Take 6.25 mg by mouth 2 (two) times daily with a meal. 02/27/14  Yes Charlynne Cousins, MD  cyclobenzaprine (FLEXERIL) 5 MG tablet Take 1 tablet (5 mg total) by mouth 3 (three) times daily as needed for muscle spasms. 03/14/14  Yes Hoyle Sauer, MD  FLUoxetine (PROZAC) 20 MG capsule Take 40 mg by mouth daily. 01/09/14  Yes Benjamine Mola, FNP  HYDROcodone-acetaminophen (NORCO/VICODIN) 5-325 MG per tablet Take 2 tablets by mouth every 4 (four) hours as needed for moderate pain or severe pain. 03/14/14  Yes Hoyle Sauer, MD  iron polysaccharides (NIFEREX) 150 MG capsule  Take 150 mg by mouth daily. 02/27/14  Yes Charlynne Cousins, MD  levETIRAcetam (KEPPRA) 1000 MG tablet Take 1,000 mg by mouth 2 (two) times daily. 02/27/14  Yes Charlynne Cousins, MD  magnesium oxide (MAG-OX) 400 (241.3 MG) MG tablet Take 1 tablet (400 mg total) by mouth 3 (three) times daily. 02/27/14  Yes Charlynne Cousins, MD  naproxen sodium (ANAPROX) 220 MG tablet Take 660 mg by mouth 2 (two) times daily with a meal.   Yes Historical Provider, MD  HYDROcodone-acetaminophen (NORCO/VICODIN) 5-325 MG per tablet Take 1 tablet by mouth every 4 (four) hours as needed for moderate pain. 03/21/14   Hoy Morn, MD  ibuprofen (ADVIL,MOTRIN) 400 MG tablet Take 1 tablet (400 mg total) by mouth every  6 (six) hours as needed. 03/14/14   Hoyle Sauer, MD   BP 124/75  Pulse 88  Temp(Src) 98.1 F (36.7 C) (Oral)  Resp 20  SpO2 100%  LMP 02/18/2014 Physical Exam  Nursing note and vitals reviewed. Constitutional: She is oriented to person, place, and time. She appears well-developed and well-nourished.  HENT:  Head: Normocephalic.  Eyes: EOM are normal.  Neck: Normal range of motion.  Pulmonary/Chest: Effort normal.  Abdominal: She exhibits no distension.  Musculoskeletal: Normal range of motion.  Bruising noted around the anterior right shoulder right clavicle.  No obvious deformity.  Full range of motion of right shoulder.  Normal right radial pulse.  Normal grip strength of right hand.  Patient with tenderness of her distal right clavicle.  No obvious deformity.  Mild right chest wall tenderness without bruising  Neurological: She is alert and oriented to person, place, and time.  Psychiatric: She has a normal mood and affect.    ED Course  Procedures (including critical care time) Labs Review Labs Reviewed - No data to display  Imaging Review Dg Chest 1 View  03/21/2014   CLINICAL DATA:  Fall.  Right chest pain.  EXAM: CHEST - 1 VIEW  COMPARISON:  None.  FINDINGS: The lungs are clear. Heart size is normal. No pneumothorax or pleural effusion. Fracture of the distal right clavicle is noted.  IMPRESSION: No acute cardiopulmonary disease.  Distal right clavicular fracture.   Electronically Signed   By: Inge Rise M.D.   On: 03/21/2014 11:08   Dg Shoulder Right  03/21/2014   CLINICAL DATA:  Pain in bruising and right shoulder status post fall  EXAM: RIGHT SHOULDER - 2+ VIEW  COMPARISON:  None.  FINDINGS: Nondisplaced fracture involving the distal clavicle is suspected. No evidence for dislocation. The glenohumeral joint is intact.  IMPRESSION: 1. Suspect nondisplaced fracture involving the distal clavicle.   Electronically Signed   By: Kerby Moors M.D.   On: 03/21/2014 11:04      EKG Interpretation None      MDM   Final diagnoses:  Right clavicle fracture, closed, initial encounter    Distal right clavicle fracture.  Home with shoulder immobilizer.  Orthopedic followup.  Short course of pain medicine.    Hoy Morn, MD 03/21/14 1120

## 2014-03-21 NOTE — Progress Notes (Signed)
[place],   Did not get to see patient but will be sending information about [facility] to help patient establish primary care, using the address provided.

## 2014-03-21 NOTE — ED Notes (Signed)
Pt states two night ago she tripped over dog and fell unto right shoulder, pt has not seen dr for that. Pt has bruising to right shoulder.

## 2014-03-21 NOTE — Discharge Instructions (Signed)
Clavicle Fracture °The clavicle, also called the collarbone, is the long bone that connects your shoulder to your rib cage. You can feel your collarbone at the top of your shoulders and rib cage. A clavicle fracture is a broken clavicle. It is a common injury that can happen at any age.  °CAUSES °Common causes of a clavicle fracture include: °· A direct blow to your shoulder. °· A car accident. °· A fall, especially if you try to break your fall with an outstretched arm. °RISK FACTORS °You may be at increased risk if: °· You are younger than 25 years or older than 75 years. Most clavicle fractures happen to people who are younger than 25 years. °· You are a female. °· You play contact sports. °SIGNS AND SYMPTOMS °A fractured clavicle is painful. It also makes it hard to move your arm. Other signs and symptoms may include: °· A shoulder that drops downward and forward. °· Pain when trying to lift your shoulder. °· Bruising, swelling, and tenderness over your clavicle. °· A grinding noise when you try to move your shoulder. °· A bump over your clavicle. °DIAGNOSIS °Your health care provider can usually diagnose a clavicle fracture by asking about your injury and examining your shoulder and clavicle. He or she may take an X-ray to determine the position of your clavicle. °TREATMENT °Treatment depends on the position of your clavicle after the fracture: °· If the broken ends of the bone are not out of place, your health care provider may put your arm in a sling or wrap a support bandage around your chest (figure-of-eight wrap). °· If the broken ends of the bone are out of place, you may need surgery. Surgery may involve placing screws, pins, or plates to keep your clavicle stable while it heals. Healing may take about 3 months. °When your health care provider thinks your fracture has healed enough, you may have to do physical therapy to regain normal movement and build up your arm strength. °HOME CARE INSTRUCTIONS   °· Apply ice to the injured area: °¨ Put ice in a plastic bag. °¨ Place a towel between your skin and the bag. °¨ Leave the ice on for 20 minutes, 2-3 times a day. °· If you have a wrap or splint: °¨ Wear it all the time, and remove it only to take a bath or shower. °¨ When you bathe or shower, keep your shoulder in the same position as when the sling or wrap is on. °¨ Do not lift your arm. °· If you have a figure-of-eight wrap: °¨ Another person must tighten it every day. °¨ It should be tight enough to hold your shoulders back. °¨ Allow enough room to place your index finger between your body and the strap. °¨ Loosen the wrap immediately if you feel numbness or tingling in your hands. °· Only take medicines as directed by your health care provider. °· Avoid activities that make the injury or pain worse for 4-6 weeks after surgery. °· Keep all follow-up appointments. °SEEK MEDICAL CARE IF:  °Your medicine is not helping to relieve pain and swelling. °SEEK IMMEDIATE MEDICAL CARE IF:  °Your arm is numb, cold, or pale, even when the splint is loose. °MAKE SURE YOU:  °· Understand these instructions. °· Will watch your condition. °· Will get help right away if you are not doing well or get worse. °Document Released: 04/15/2005 Document Revised: 07/11/2013 Document Reviewed: 05/29/2013 °[employer]® Patient Information ©2015 [employer]. This information is   not intended to replace advice given to you by your health care provider. Make sure you discuss any questions you have with your health care provider.

## 2014-03-24 ENCOUNTER — Emergency Department (HOSPITAL_COMMUNITY): Payer: MEDICAID

## 2014-03-24 ENCOUNTER — Encounter (HOSPITAL_COMMUNITY): Payer: Self-pay | Admitting: Emergency Medicine

## 2014-03-24 ENCOUNTER — Emergency Department (HOSPITAL_COMMUNITY)
Admission: EM | Admit: 2014-03-24 | Discharge: 2014-03-24 | Discharge status: Against Medical Advice | Payer: Self-pay | Attending: Emergency Medicine | Admitting: Emergency Medicine

## 2014-03-24 DIAGNOSIS — F329 Major depressive disorder, single episode, unspecified: Secondary | ICD-10-CM | POA: Insufficient documentation

## 2014-03-24 DIAGNOSIS — F411 Generalized anxiety disorder: Secondary | ICD-10-CM | POA: Insufficient documentation

## 2014-03-24 DIAGNOSIS — R079 Chest pain, unspecified: Secondary | ICD-10-CM | POA: Insufficient documentation

## 2014-03-24 DIAGNOSIS — R0602 Shortness of breath: Secondary | ICD-10-CM | POA: Insufficient documentation

## 2014-03-24 DIAGNOSIS — F3289 Other specified depressive episodes: Secondary | ICD-10-CM | POA: Insufficient documentation

## 2014-03-24 DIAGNOSIS — Z8719 Personal history of other diseases of the digestive system: Secondary | ICD-10-CM | POA: Insufficient documentation

## 2014-03-24 DIAGNOSIS — Z79899 Other long term (current) drug therapy: Secondary | ICD-10-CM | POA: Insufficient documentation

## 2014-03-24 DIAGNOSIS — Z880 Allergy status to penicillin: Secondary | ICD-10-CM | POA: Insufficient documentation

## 2014-03-24 DIAGNOSIS — G40909 Epilepsy, unspecified, not intractable, without status epilepticus: Secondary | ICD-10-CM | POA: Insufficient documentation

## 2014-03-24 DIAGNOSIS — Z791 Long term (current) use of non-steroidal anti-inflammatories (NSAID): Secondary | ICD-10-CM | POA: Insufficient documentation

## 2014-03-24 DIAGNOSIS — D649 Anemia, unspecified: Secondary | ICD-10-CM | POA: Insufficient documentation

## 2014-03-24 DIAGNOSIS — Z8742 Personal history of other diseases of the female genital tract: Secondary | ICD-10-CM | POA: Insufficient documentation

## 2014-03-24 DIAGNOSIS — Z8679 Personal history of other diseases of the circulatory system: Secondary | ICD-10-CM | POA: Insufficient documentation

## 2014-03-24 MED ORDER — KETOROLAC TROMETHAMINE 60 MG/2ML IM SOLN: 60.0000 mg | Freq: Once | INTRAMUSCULAR | Status: DC
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
## 2014-03-24 NOTE — ED Notes (Signed)
Patient wishes to sign AMA, Dr. Stark Jock made aware of same. Patient to sign AMA.

## 2014-03-24 NOTE — ED Notes (Signed)
Patient has mild swelling to left ankle, pedal pulse intact, warm to touch, unable to flex same.

## 2014-03-24 NOTE — ED Notes (Signed)
Patient reports ankle, leg pain, chest pain, right clavicle pain, 8/10.
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
## 2014-03-25 ENCOUNTER — Emergency Department (HOSPITAL_COMMUNITY)
Admission: EM | Admit: 2014-03-25 | Discharge: 2014-03-26 | Disposition: A | Discharge status: Home / Self Care | Payer: Self-pay | Attending: Emergency Medicine | Admitting: Emergency Medicine

## 2014-03-25 ENCOUNTER — Encounter (HOSPITAL_COMMUNITY): Payer: Self-pay | Admitting: Emergency Medicine

## 2014-03-25 ENCOUNTER — Emergency Department (HOSPITAL_COMMUNITY)
Admission: EM | Admit: 2014-03-25 | Discharge: 2014-03-25 | Discharge status: Against Medical Advice | Payer: Self-pay | Attending: Emergency Medicine | Admitting: Emergency Medicine

## 2014-03-25 DIAGNOSIS — F411 Generalized anxiety disorder: Secondary | ICD-10-CM | POA: Insufficient documentation

## 2014-03-25 DIAGNOSIS — Z79899 Other long term (current) drug therapy: Secondary | ICD-10-CM | POA: Insufficient documentation

## 2014-03-25 DIAGNOSIS — F1994 Other psychoactive substance use, unspecified with psychoactive substance-induced mood disorder: Secondary | ICD-10-CM | POA: Diagnosis present

## 2014-03-25 DIAGNOSIS — D649 Anemia, unspecified: Secondary | ICD-10-CM | POA: Insufficient documentation

## 2014-03-25 DIAGNOSIS — R0602 Shortness of breath: Secondary | ICD-10-CM | POA: Insufficient documentation

## 2014-03-25 DIAGNOSIS — R079 Chest pain, unspecified: Secondary | ICD-10-CM | POA: Insufficient documentation

## 2014-03-25 DIAGNOSIS — M25519 Pain in unspecified shoulder: Secondary | ICD-10-CM | POA: Insufficient documentation

## 2014-03-25 DIAGNOSIS — F329 Major depressive disorder, single episode, unspecified: Secondary | ICD-10-CM | POA: Diagnosis present

## 2014-03-25 DIAGNOSIS — R110 Nausea: Secondary | ICD-10-CM | POA: Insufficient documentation

## 2014-03-25 DIAGNOSIS — F1010 Alcohol abuse, uncomplicated: Secondary | ICD-10-CM | POA: Insufficient documentation

## 2014-03-25 DIAGNOSIS — M25511 Pain in right shoulder: Secondary | ICD-10-CM

## 2014-03-25 DIAGNOSIS — F3289 Other specified depressive episodes: Secondary | ICD-10-CM | POA: Insufficient documentation

## 2014-03-25 DIAGNOSIS — Z765 Malingerer [conscious simulation]: Secondary | ICD-10-CM | POA: Insufficient documentation

## 2014-03-25 DIAGNOSIS — R45851 Suicidal ideations: Secondary | ICD-10-CM | POA: Insufficient documentation

## 2014-03-25 DIAGNOSIS — Z791 Long term (current) use of non-steroidal anti-inflammatories (NSAID): Secondary | ICD-10-CM | POA: Insufficient documentation

## 2014-03-25 DIAGNOSIS — Z8742 Personal history of other diseases of the female genital tract: Secondary | ICD-10-CM | POA: Insufficient documentation

## 2014-03-25 DIAGNOSIS — Z8719 Personal history of other diseases of the digestive system: Secondary | ICD-10-CM | POA: Insufficient documentation

## 2014-03-25 DIAGNOSIS — G40909 Epilepsy, unspecified, not intractable, without status epilepticus: Secondary | ICD-10-CM | POA: Insufficient documentation

## 2014-03-25 DIAGNOSIS — R071 Chest pain on breathing: Secondary | ICD-10-CM | POA: Insufficient documentation

## 2014-03-25 DIAGNOSIS — Z8679 Personal history of other diseases of the circulatory system: Secondary | ICD-10-CM | POA: Insufficient documentation

## 2014-03-25 DIAGNOSIS — Z880 Allergy status to penicillin: Secondary | ICD-10-CM | POA: Insufficient documentation

## 2014-03-25 DIAGNOSIS — F419 Anxiety disorder, unspecified: Secondary | ICD-10-CM

## 2014-03-25 DIAGNOSIS — Z3202 Encounter for pregnancy test, result negative: Secondary | ICD-10-CM | POA: Insufficient documentation

## 2014-03-25 DIAGNOSIS — F1020 Alcohol dependence, uncomplicated: Secondary | ICD-10-CM | POA: Diagnosis present

## 2014-03-25 DIAGNOSIS — F10920 Alcohol use, unspecified with intoxication, uncomplicated: Secondary | ICD-10-CM

## 2014-03-25 DIAGNOSIS — Z862 Personal history of diseases of the blood and blood-forming organs and certain disorders involving the immune mechanism: Secondary | ICD-10-CM | POA: Insufficient documentation

## 2014-03-25 DIAGNOSIS — F10230 Alcohol dependence with withdrawal, uncomplicated: Secondary | ICD-10-CM

## 2014-03-25 HISTORY — DX: Cardiac arrest, cause unspecified: I46.9

## 2014-03-25 LAB — CBC WITH DIFFERENTIAL/PLATELET
Basophils Absolute: 0 10*3/uL (ref 0.0–0.1)
Basophils Relative: 1 % (ref 0–1)
Eosinophils Absolute: 0 10*3/uL (ref 0.0–0.7)
Eosinophils Relative: 0 % (ref 0–5)
HCT: 28.3 % — ABNORMAL LOW (ref 36.0–46.0)
Hemoglobin: 9.3 g/dL — ABNORMAL LOW (ref 12.0–15.0)
Lymphocytes Relative: 38 % (ref 12–46)
Lymphs Abs: 1 10*3/uL (ref 0.7–4.0)
MCH: 30.8 pg (ref 26.0–34.0)
MCHC: 32.9 g/dL (ref 30.0–36.0)
MCV: 93.7 fL (ref 78.0–100.0)
Monocytes Absolute: 0.3 10*3/uL (ref 0.1–1.0)
Monocytes Relative: 11 % (ref 3–12)
Neutro Abs: 1.3 10*3/uL — ABNORMAL LOW (ref 1.7–7.7)
Neutrophils Relative %: 50 % (ref 43–77)
Platelets: 152 10*3/uL (ref 150–400)
RBC: 3.02 MIL/uL — ABNORMAL LOW (ref 3.87–5.11)
RDW: 17.2 % — ABNORMAL HIGH (ref 11.5–15.5)
WBC: 2.6 10*3/uL — ABNORMAL LOW (ref 4.0–10.5)

## 2014-03-25 LAB — COMPREHENSIVE METABOLIC PANEL
ALT: 15 U/L (ref 0–35)
AST: 49 U/L — ABNORMAL HIGH (ref 0–37)
Albumin: 3.5 g/dL (ref 3.5–5.2)
Alkaline Phosphatase: 126 U/L — ABNORMAL HIGH (ref 39–117)
Anion gap: 13 (ref 5–15)
BUN: 10 mg/dL (ref 6–23)
CO2: 26 mEq/L (ref 19–32)
Calcium: 8 mg/dL — ABNORMAL LOW (ref 8.4–10.5)
Chloride: 107 mEq/L (ref 96–112)
Creatinine, Ser: 0.4 mg/dL — ABNORMAL LOW (ref 0.50–1.10)
GFR calc Af Amer: >90 mL/min (ref >90)
GFR calc non Af Amer: >90 mL/min (ref >90)
Glucose, Bld: 128 mg/dL — ABNORMAL HIGH (ref 70–99)
Potassium: 3.6 mEq/L — ABNORMAL LOW (ref 3.7–5.3)
Sodium: 146 mEq/L (ref 137–147)
Total Bilirubin: 0.3 mg/dL (ref 0.3–1.2)
Total Protein: 6.4 g/dL (ref 6.0–8.3)

## 2014-03-25 LAB — PRO B NATRIURETIC PEPTIDE: Pro B Natriuretic peptide (BNP): 127.2 pg/mL — ABNORMAL HIGH (ref 0–125)

## 2014-03-25 LAB — TROPONIN I

## 2014-03-25 LAB — ETHANOL: ALCOHOL ETHYL (B): 355 mg/dL — AB (ref 0–11)

## 2014-03-25 MED ORDER — KETOROLAC TROMETHAMINE 60 MG/2ML IM SOLN: 60.0000 mg | Freq: Once | INTRAMUSCULAR | Status: DC

## 2014-03-25 MED ORDER — SODIUM CHLORIDE 0.9 % IV BOLUS (SEPSIS): 1000.0000 mL | Freq: Once | INTRAVENOUS | Status: DC

## 2014-03-25 MED ORDER — KETOROLAC TROMETHAMINE 30 MG/ML IJ SOLN: 30.0000 mg | Freq: Once | INTRAMUSCULAR | Status: DC

## 2014-03-25 NOTE — ED Notes (Signed)
Bed: [facility] Expected date:  Expected time:  Means of arrival:  Comments: Blood draw
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
## 2014-03-25 NOTE — ED Notes (Signed)
Pt upset about not receiving pain medicine. Cursing, stating she wants to leave.

## 2014-03-25 NOTE — ED Notes (Signed)
Charted in error pulse ox of 66%. Pulse ox 98% RA

## 2014-03-25 NOTE — ED Notes (Signed)
Pt's family called out stating pt was having a seizure "or something." Marissa EDPA, [place] EDP, and myself walked into pt's room to find curled up into a ball with eyes closed. HR 106 NSR. When pt asked what was going on pt states "Nothing, I'm fine."

## 2014-03-25 NOTE — ED Notes (Signed)
Pt refused second set of vitals, also refused to sign signature pad for AMA.
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
## 2014-03-25 NOTE — ED Notes (Signed)
Pt left AMA. Pt states "this hospital sucks, I am going to [place] where I can get Dilaudid." Pt states nothing works for her besides Dilaudid and why would people try to give her things that do not work. Pt dressed and walking out door.

## 2014-03-25 NOTE — ED Notes (Signed)
Pt states "the only think I will take is dilaudid." Pt refused toradol.

## 2014-03-26 ENCOUNTER — Emergency Department (HOSPITAL_COMMUNITY): Payer: Self-pay

## 2014-03-26 ENCOUNTER — Encounter (HOSPITAL_COMMUNITY): Payer: Self-pay | Admitting: Psychiatry

## 2014-03-26 DIAGNOSIS — F1010 Alcohol abuse, uncomplicated: Secondary | ICD-10-CM

## 2014-03-26 DIAGNOSIS — F1910 Other psychoactive substance abuse, uncomplicated: Secondary | ICD-10-CM

## 2014-03-26 DIAGNOSIS — F1994 Other psychoactive substance use, unspecified with psychoactive substance-induced mood disorder: Secondary | ICD-10-CM | POA: Diagnosis present

## 2014-03-26 LAB — RAPID URINE DRUG SCREEN, HOSP PERFORMED
AMPHETAMINES: NOT DETECTED
BARBITURATES: NOT DETECTED
BENZODIAZEPINES: NOT DETECTED
COCAINE: NOT DETECTED
Opiates: NOT DETECTED
Tetrahydrocannabinol: NOT DETECTED

## 2014-03-26 LAB — COMPREHENSIVE METABOLIC PANEL
ALBUMIN: 3.8 g/dL (ref 3.5–5.2)
ALK PHOS: 128 U/L — AB (ref 39–117)
ALT: 16 U/L (ref 0–35)
AST: 58 U/L — AB (ref 0–37)
Anion gap: 15 (ref 5–15)
BILIRUBIN TOTAL: 0.3 mg/dL (ref 0.3–1.2)
BUN: 10 mg/dL (ref 6–23)
CO2: 25 meq/L (ref 19–32)
Calcium: 8.2 mg/dL — ABNORMAL LOW (ref 8.4–10.5)
Chloride: 104 mEq/L (ref 96–112)
Creatinine, Ser: 0.37 mg/dL — ABNORMAL LOW (ref 0.50–1.10)
GFR calc Af Amer: >90 mL/min (ref >90)
GFR calc non Af Amer: >90 mL/min (ref >90)
Glucose, Bld: 125 mg/dL — ABNORMAL HIGH (ref 70–99)
POTASSIUM: 3.4 meq/L — AB (ref 3.7–5.3)
SODIUM: 144 meq/L (ref 137–147)
Total Protein: 6.8 g/dL (ref 6.0–8.3)

## 2014-03-26 LAB — CBC
HCT: 28.7 % — ABNORMAL LOW (ref 36.0–46.0)
Hemoglobin: 9.4 g/dL — ABNORMAL LOW (ref 12.0–15.0)
MCH: 30.6 pg (ref 26.0–34.0)
MCHC: 32.8 g/dL (ref 30.0–36.0)
MCV: 93.5 fL (ref 78.0–100.0)
PLATELETS: 155 10*3/uL (ref 150–400)
RBC: 3.07 MIL/uL — ABNORMAL LOW (ref 3.87–5.11)
RDW: 17.2 % — AB (ref 11.5–15.5)
WBC: 2.2 10*3/uL — ABNORMAL LOW (ref 4.0–10.5)

## 2014-03-26 LAB — ACETAMINOPHEN LEVEL

## 2014-03-26 LAB — ETHANOL: ALCOHOL ETHYL (B): 334 mg/dL — AB (ref 0–11)

## 2014-03-26 LAB — SALICYLATE LEVEL: Salicylate Lvl: <2 mg/dL — ABNORMAL LOW (ref 2.8–20.0)

## 2014-03-26 MED ORDER — NICOTINE 21 MG/24HR TD PT24: 21.0000 mg | MEDICATED_PATCH | Freq: Every day | TRANSDERMAL | Status: DC

## 2014-03-26 MED ORDER — ONDANSETRON HCL 4 MG PO TABS: 4.0000 mg | ORAL_TABLET | Freq: Three times a day (TID) | ORAL | Status: DC | PRN

## 2014-03-26 MED ORDER — IBUPROFEN 200 MG PO TABS: 600.0000 mg | ORAL_TABLET | Freq: Three times a day (TID) | ORAL | Status: DC | PRN

## 2014-03-26 MED ORDER — LORAZEPAM 1 MG PO TABS: 0.0000 mg | ORAL_TABLET | Freq: Four times a day (QID) | ORAL | Status: DC

## 2014-03-26 MED ORDER — LORAZEPAM 1 MG PO TABS: 1.0000 mg | ORAL_TABLET | Freq: Three times a day (TID) | ORAL | Status: DC | PRN

## 2014-03-26 MED ORDER — ZOLPIDEM TARTRATE 5 MG PO TABS: 5.0000 mg | ORAL_TABLET | Freq: Every evening | ORAL | Status: DC | PRN

## 2014-03-26 MED ORDER — LORAZEPAM 1 MG PO TABS: 0.0000 mg | ORAL_TABLET | Freq: Two times a day (BID) | ORAL | Status: DC

## 2014-03-26 NOTE — Discharge Instructions (Signed)
Alcohol Use Disorder Alcohol use disorder is a mental disorder. It is not a one-time incident of heavy drinking. Alcohol use disorder is the excessive and uncontrollable use of alcohol over time that leads to problems with functioning in one or more areas of daily living. People with this disorder risk harming themselves and others when they drink to excess. Alcohol use disorder also can cause other mental disorders, such as mood and anxiety disorders, and serious physical problems. People with alcohol use disorder often misuse other drugs.  Alcohol use disorder is common and widespread. Some people with this disorder drink alcohol to cope with or escape from negative life events. Others drink to relieve chronic pain or symptoms of mental illness. People with a family history of alcohol use disorder are at higher risk of losing control and using alcohol to excess.  SYMPTOMS  Signs and symptoms of alcohol use disorder may include the following:   Consumption ofalcohol inlarger amounts or over a longer period of time than intended.  Multiple unsuccessful attempts to cutdown or control alcohol use.   A great deal of time spent obtaining alcohol, using alcohol, or recovering from the effects of alcohol (hangover).  A strong desire or urge to use alcohol (cravings).   Continued use of alcohol despite problems at work, school, or home because of alcohol use.   Continued use of alcohol despite problems in relationships because of alcohol use.  Continued use of alcohol in situations when it is physically hazardous, such as driving a car.  Continued use of alcohol despite awareness of a physical or psychological problem that is likely related to alcohol use. Physical problems related to alcohol use can involve the brain, heart, liver, stomach, and intestines. Psychological problems related to alcohol use include intoxication, depression, anxiety, psychosis, delirium, and dementia.   The need for  increased amounts of alcohol to achieve the same desired effect, or a decreased effect from the consumption of the same amount of alcohol (tolerance).  Withdrawal symptoms upon reducing or stopping alcohol use, or alcohol use to reduce or avoid withdrawal symptoms. Withdrawal symptoms include:  Racing heart.  Hand tremor.  Difficulty sleeping.  Nausea.  Vomiting.  Hallucinations.  Restlessness.  Seizures. DIAGNOSIS Alcohol use disorder is diagnosed through an assessment by your health care provider. Your health care provider may start by asking three or four questions to screen for excessive or problematic alcohol use. To confirm a diagnosis of alcohol use disorder, at least two symptoms must be present within a 12-month period. The severity of alcohol use disorder depends on the number of symptoms:  Mild--two or three.  Moderate--four or five.  Severe--six or more. Your health care provider may perform a physical exam or use results from lab tests to see if you have physical problems resulting from alcohol use. Your health care provider may refer you to a mental health professional for evaluation. TREATMENT  Some people with alcohol use disorder are able to reduce their alcohol use to low-risk levels. Some people with alcohol use disorder need to quit drinking alcohol. When necessary, mental health professionals with specialized training in substance use treatment can help. Your health care provider can help you decide how severe your alcohol use disorder is and what type of treatment you need. The following forms of treatment are available:   Detoxification. Detoxification involves the use of prescription medicines to prevent alcohol withdrawal symptoms in the first week after quitting. This is important for people with a history of symptoms   of withdrawal and for heavy drinkers who are likely to have withdrawal symptoms. Alcohol withdrawal can be dangerous and, in severe cases, cause  death. Detoxification is usually provided in a hospital or in-patient substance use treatment facility.  Counseling or talk therapy. Talk therapy is provided by substance use treatment counselors. It addresses the reasons people use alcohol and ways to keep them from drinking again. The goals of talk therapy are to help people with alcohol use disorder find healthy activities and ways to cope with life stress, to identify and avoid triggers for alcohol use, and to handle cravings, which can cause relapse.  Medicines.Different medicines can help treat alcohol use disorder through the following actions:  Decrease alcohol cravings.  Decrease the positive reward response felt from alcohol use.  Produce an uncomfortable physical reaction when alcohol is used (aversion therapy).  Support groups. Support groups are run by people who have quit drinking. They provide emotional support, advice, and guidance. These forms of treatment are often combined. Some people with alcohol use disorder benefit from intensive combination treatment provided by specialized substance use treatment centers. Both inpatient and outpatient treatment programs are available. Document Released: 08/13/2004 Document Revised: 11/20/2013 Document Reviewed: 10/13/2012 [employer] Patient Information 2015 [employer]. This information is not intended to replace advice given to you by your health care provider. Make sure you discuss any questions you have with your health care provider.  
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
## 2014-03-26 NOTE — ED Provider Notes (Signed)
Medical screening examination/treatment/procedure(s) were performed by non-physician practitioner and as supervising physician I was immediately available for consultation/collaboration.   EKG Interpretation   Date/Time:  Sunday March 25 2014 15:09:46 EDT Ventricular Rate:  103 PR Interval:  138 QRS Duration: 82 QT Interval:  384 QTC Calculation: 503 R Axis:   100 Text Interpretation:  Sinus tachycardia Rightward axis Possible Anterior  infarct , age undetermined Abnormal ECG compared to prior, rate increased  Confirmed by [facility]  MD, TREY ([id number]) on 03/25/2014 5:15:22 PM        [name], MD 03/26/14 [phone]
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
## 2014-03-26 NOTE — ED Provider Notes (Signed)
Medical screening examination/treatment/procedure(s) were performed by non-physician practitioner and as supervising physician I was immediately available for consultation/collaboration.   EKG Interpretation None       Threasa Beards, MD 03/26/14 [phone]
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
## 2014-03-26 NOTE — [facility] Suicide Risk Assessment (Signed)
Suicide Risk Assessment  Discharge Assessment     Demographic Factors:  Caucasian and Unemployed  Total Time spent with patient: 20 minutes  Psychiatric Specialty Exam:     Blood pressure 123/91, pulse 82, temperature 98.1 F (36.7 C), temperature source Oral, resp. rate 18, last menstrual period 03/22/2014, SpO2 100.00%.There is no weight on file to calculate BMI.  General Appearance: Casual  Eye Contact::  Good  Speech:  Normal Rate  Volume:  Normal  Mood:  Anxious  Affect:  Congruent  Thought Process:  Coherent  Orientation:  Full (Time, Place, and Person)  Thought Content:  WDL  Suicidal Thoughts:  No  Homicidal Thoughts:  No  Memory:  Immediate;   Good Recent;   Good Remote;   Good  Judgement:  Fair  Insight:  Fair  Psychomotor Activity:  Normal  Concentration:  Good  Recall:  Good  Fund of Knowledge:Good  Language: Good  Akathisia:  No  Handed:  Right  AIMS (if indicated):     Assets:  Agricultural consultant Housing Leisure Time Resilience Social Support  Sleep:      Musculoskeletal: Strength & Muscle Tone: within normal limits Gait & Station: normal Patient leans: N/A   Mental Status Per Nursing Assessment::   On Admission:   alcohol abuse  Current Mental Status by Physician: NA  Loss Factors: NA  Historical Factors: NA  Risk Reduction Factors:   Responsible for children under 35 years of age, Sense of responsibility to family, Positive social support, Positive therapeutic relationship and Positive coping skills or problem solving skills  Continued Clinical Symptoms:  Anxiety  Cognitive Features That Contribute To Risk:  None  Suicide Risk:  Minimal: No identifiable suicidal ideation.  Patients presenting with no risk factors but with morbid ruminations; may be classified as minimal risk based on the severity of the depressive symptoms  Discharge Diagnoses:   AXIS I:  Alcohol Abuse and Substance Induced  Mood Disorder AXIS II:  Deferred AXIS III:   Past Medical History  Diagnosis Date  . Proctitis   . Cysts of both ovaries   . Seizures   . Anemia   . Anxiety   . Blood transfusion without reported diagnosis   . Depression   . Fatty liver 10/05/13  . Cardiac arrest    AXIS IV:  other psychosocial or environmental problems and problems related to social environment AXIS V:  61-70 mild symptoms  Plan Of Care/Follow-up recommendations:  Activity:  as tolerated Diet:  low-sodium heart heatlhy diet  Is patient on multiple antipsychotic therapies at discharge:  No   Has Patient had three or more failed trials of antipsychotic monotherapy by history:  No  Recommended Plan for Multiple Antipsychotic Therapies: NA    Tomisha Reppucci, [place], PMH-NP 03/26/2014, 12:14 PM

## 2014-03-26 NOTE — ED Notes (Signed)
Patient refused breakfast tray 

## 2014-03-30 ENCOUNTER — Emergency Department (HOSPITAL_BASED_OUTPATIENT_CLINIC_OR_DEPARTMENT_OTHER): Payer: Self-pay

## 2014-03-30 ENCOUNTER — Emergency Department (HOSPITAL_BASED_OUTPATIENT_CLINIC_OR_DEPARTMENT_OTHER)
Admission: EM | Admit: 2014-03-30 | Discharge: 2014-03-30 | Disposition: A | Discharge status: Home / Self Care | Payer: Self-pay | Attending: Emergency Medicine | Admitting: Emergency Medicine

## 2014-03-30 DIAGNOSIS — R569 Unspecified convulsions: Secondary | ICD-10-CM | POA: Insufficient documentation

## 2014-03-30 DIAGNOSIS — F411 Generalized anxiety disorder: Secondary | ICD-10-CM | POA: Insufficient documentation

## 2014-03-30 DIAGNOSIS — Z79899 Other long term (current) drug therapy: Secondary | ICD-10-CM | POA: Insufficient documentation

## 2014-03-30 DIAGNOSIS — S4980XA Other specified injuries of shoulder and upper arm, unspecified arm, initial encounter: Secondary | ICD-10-CM | POA: Insufficient documentation

## 2014-03-30 DIAGNOSIS — F329 Major depressive disorder, single episode, unspecified: Secondary | ICD-10-CM | POA: Insufficient documentation

## 2014-03-30 DIAGNOSIS — W1809XA Striking against other object with subsequent fall, initial encounter: Secondary | ICD-10-CM | POA: Insufficient documentation

## 2014-03-30 DIAGNOSIS — S42033A Displaced fracture of lateral end of unspecified clavicle, initial encounter for closed fracture: Secondary | ICD-10-CM | POA: Insufficient documentation

## 2014-03-30 DIAGNOSIS — Z880 Allergy status to penicillin: Secondary | ICD-10-CM | POA: Insufficient documentation

## 2014-03-30 DIAGNOSIS — W010XXA Fall on same level from slipping, tripping and stumbling without subsequent striking against object, initial encounter: Secondary | ICD-10-CM | POA: Insufficient documentation

## 2014-03-30 DIAGNOSIS — Z8679 Personal history of other diseases of the circulatory system: Secondary | ICD-10-CM | POA: Insufficient documentation

## 2014-03-30 DIAGNOSIS — Y929 Unspecified place or not applicable: Secondary | ICD-10-CM | POA: Insufficient documentation

## 2014-03-30 DIAGNOSIS — Z8719 Personal history of other diseases of the digestive system: Secondary | ICD-10-CM | POA: Insufficient documentation

## 2014-03-30 DIAGNOSIS — Y9389 Activity, other specified: Secondary | ICD-10-CM | POA: Insufficient documentation

## 2014-03-30 DIAGNOSIS — D649 Anemia, unspecified: Secondary | ICD-10-CM | POA: Insufficient documentation

## 2014-03-30 DIAGNOSIS — F3289 Other specified depressive episodes: Secondary | ICD-10-CM | POA: Insufficient documentation

## 2014-03-30 DIAGNOSIS — S42001A Fracture of unspecified part of right clavicle, initial encounter for closed fracture: Secondary | ICD-10-CM

## 2014-03-30 DIAGNOSIS — Z791 Long term (current) use of non-steroidal anti-inflammatories (NSAID): Secondary | ICD-10-CM | POA: Insufficient documentation

## 2014-03-30 DIAGNOSIS — Z8742 Personal history of other diseases of the female genital tract: Secondary | ICD-10-CM | POA: Insufficient documentation

## 2014-03-30 DIAGNOSIS — S46909A Unspecified injury of unspecified muscle, fascia and tendon at shoulder and upper arm level, unspecified arm, initial encounter: Secondary | ICD-10-CM | POA: Insufficient documentation

## 2014-03-30 MED ORDER — ONDANSETRON 4 MG PO TBDP
4.0000 mg | ORAL_TABLET | Freq: Once | ORAL | Status: AC
  Administered 2014-03-30: 4 mg via ORAL
  Filled 2014-03-30: qty 1

## 2014-03-30 MED ORDER — HYDROCODONE-ACETAMINOPHEN 5-325 MG PO TABS: 2.0000 | ORAL_TABLET | ORAL | Status: DC | PRN

## 2014-03-30 MED ORDER — OXYCODONE-ACETAMINOPHEN 5-325 MG PO TABS
2.0000 | ORAL_TABLET | Freq: Once | ORAL | Status: AC
  Administered 2014-03-30: 2 via ORAL
  Filled 2014-03-30: qty 2
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
## 2014-03-30 NOTE — Discharge Instructions (Signed)
Clavicle Fracture °A clavicle fracture is a broken collarbone. The collarbone is the long bone that connects your shoulder to your rib cage. A broken collarbone may be treated with a sling, a wrap, or surgery. Treatment depends on whether the broken ends of the bone are out of place or not. °HOME CARE °· Put ice on the injured area: °¨ Put ice in a plastic bag. °¨ Place a towel between your skin and the bag. °¨ Leave the ice on for 20 minutes, 2-3 times a day. °· If you have a wrap or splint: °¨ Wear it all the time, and remove it only to take a bath or shower. °¨ When you bathe or shower, keep your shoulder in the same place as when the sling or wrap is on. °¨ Do not lift your arm. °· If you have a wrap: °¨ Another person must tighten it every day. °¨ It should be tight enough to hold your shoulders back. °¨ Make sure you have enough room to put your pointer finger between your body and the strap. °¨ Loosen the wrap right away if you cannot feel your arm or your hands tingle. °· Only take medicines as told by your doctor. °· Avoid activities that make the injury or pain worse for 4-6 weeks after surgery. °· Keep all follow-up appointments. °GET HELP IF: °· Your medicine is not making you feel less pain. °· Your medicine is not making swelling better. °GET HELP RIGHT AWAY IF:  °· Your cannot feel your arm. °· Your arm is cold. °· Your arm is a lighter color than normal. °MAKE SURE YOU:  °· Understand these instructions. °· Will watch your condition. °· Will get help right away if you are not doing well or get worse. °Document Released: 12/23/2007 Document Revised: 07/11/2013 Document Reviewed: 04/23/2009 °ExitCare® Patient Information ©2015 [employer]. This information is not intended to replace advice given to you by your health care provider. Make sure you discuss any questions you have with your health care provider. ° °

## 2014-03-30 NOTE — ED Provider Notes (Signed)
CSN: [id number]     Arrival date & time 03/30/14  1850 History   First MD Initiated Contact with Patient 03/30/14 1924     Chief Complaint  Patient presents with  . Clavicle Injury     (Consider location/radiation/quality/duration/timing/severity/associated sxs/prior Treatment) Patient is a 38 y.o. female presenting with shoulder pain. The history is provided by the patient. No language interpreter was used.  Shoulder Pain This is a new problem. The current episode started today. The problem occurs constantly. The problem has been gradually worsening. Associated symptoms include arthralgias and joint swelling. Nothing aggravates the symptoms. She has tried nothing for the symptoms.   Pt has a history of sternal fracture.  Pt fell today and hit area again.   Pt complains of pain Past Medical History  Diagnosis Date  . Proctitis   . Cysts of both ovaries   . Seizures   . Anemia   . Anxiety   . Blood transfusion without reported diagnosis   . Depression   . Fatty liver 10/05/13  . Cardiac arrest    Past Surgical History  Procedure Laterality Date  . Ovarian cyst removal    . Laparoscopy N/A 09/28/2013    Procedure: LAPAROSCOPY OPERATIVE;  Surgeon: Terrance Mass, MD;  Location: [facility];  Service: Gynecology;  Laterality: N/A;  . Laparoscopic appendectomy Right 09/28/2013    Procedure: APPENDECTOMY LAPAROSCOPIC;  Surgeon: Terrance Mass, MD;  Location: [facility];  Service: Gynecology;  Laterality: Right;  . Salpingoophorectomy Right 09/28/2013    Procedure: SALPINGO OOPHORECTOMY;  Surgeon: Terrance Mass, MD;  Location: [facility];  Service: Gynecology;  Laterality: Right;  . Colonoscopy N/A 09/30/2013    Procedure: COLONOSCOPY;  Surgeon: Lafayette Dragon, MD;  Location: [facility];  Service: Endoscopy;  Laterality: N/A;  . Esophagogastroduodenoscopy N/A 11/23/2013    Procedure: ESOPHAGOGASTRODUODENOSCOPY (EGD);  Surgeon: Jerene Bears, MD;  Location: [facility];  Service: Endoscopy;   Laterality: N/A;  . Appendectomy     Family History  Problem Relation Age of Onset  . Diabetes Mother   . Hyperlipidemia Mother   . Stroke Mother   . Diabetes Father    History  Substance Use Topics  . Smoking status: Never Smoker   . Smokeless tobacco: Never Used  . Alcohol Use: Yes   OB History   Grav Para Term Preterm Abortions TAB SAB Ect Mult Living   7 3   4  4   3      Review of Systems  Musculoskeletal: Positive for arthralgias and joint swelling.  All other systems reviewed and are negative.     Allergies  Morphine and related; Tramadol; and Penicillins  Home Medications   Prior to Admission medications   Medication Sig Start Date End Date Taking? Authorizing Provider  acetaminophen (TYLENOL) 325 MG tablet Take 650 mg by mouth every 6 (six) hours as needed for moderate pain.    Historical Provider, MD  carvedilol (COREG) 6.25 MG tablet Take 6.25 mg by mouth 2 (two) times daily with a meal. 02/27/14   Charlynne Cousins, MD  cyclobenzaprine (FLEXERIL) 5 MG tablet Take 5 mg by mouth 3 (three) times daily as needed for muscle spasms.    Historical Provider, MD  FLUoxetine (PROZAC) 20 MG capsule Take 40 mg by mouth daily. 01/09/14   Benjamine Mola, FNP  HYDROcodone-acetaminophen (NORCO/VICODIN) 5-325 MG per tablet Take 1 tablet by mouth every 4 (four) hours as needed for moderate pain.    Historical Provider,  MD  ibuprofen (ADVIL,MOTRIN) 400 MG tablet Take 400 mg by mouth every 6 (six) hours as needed for mild pain.    Historical Provider, MD  iron polysaccharides (NIFEREX) 150 MG capsule Take 150 mg by mouth daily. 02/27/14   Charlynne Cousins, MD  levETIRAcetam (KEPPRA) 1000 MG tablet Take 1,000 mg by mouth 2 (two) times daily. 02/27/14   Charlynne Cousins, MD  naproxen sodium (ANAPROX) 220 MG tablet Take 660 mg by mouth 2 (two) times daily with a meal.    Historical Provider, MD   BP 143/91  Pulse 108  Temp(Src) 98.3 F (36.8 C) (Oral)  Resp 20  Wt 126 lb  (57.153 kg)  SpO2 100%  LMP 03/22/2014 Physical Exam  Nursing note and vitals reviewed. Constitutional: She is oriented to person, place, and time. She appears well-developed and well-nourished.  HENT:  Head: Normocephalic.  Eyes: Pupils are equal, round, and reactive to light.  Neck: Normal range of motion.  Cardiovascular: Normal rate.   Pulmonary/Chest: Effort normal.  Abdominal: Soft.  Musculoskeletal: She exhibits tenderness.  Neurological: She is alert and oriented to person, place, and time. She has normal reflexes.  Skin: Skin is warm.  Psychiatric: She has a normal mood and affect.    ED Course  Procedures (including critical care time) Labs Review Labs Reviewed - No data to display  Imaging Review No results found.   EKG Interpretation None      MDM   Final diagnoses:  Clavicle fracture, right, closed, initial encounter    I will treat pt's pain.   Pt will follow up with Orthopaedist as scheduled    Fransico Meadow, PA-C 03/30/14 2019
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
## 2014-03-30 NOTE — ED Notes (Signed)
Pt returned to room  

## 2014-03-30 NOTE — ED Provider Notes (Signed)
Medical screening examination/treatment/procedure(s) were performed by non-physician practitioner and as supervising physician I was immediately available for consultation/collaboration.    Dorie Rank, MD 03/30/14 [phone]

## 2014-03-31 ENCOUNTER — Emergency Department (HOSPITAL_COMMUNITY)
Admission: EM | Admit: 2014-03-31 | Discharge: 2014-03-31 | Disposition: A | Discharge status: Home / Self Care | Payer: Self-pay | Attending: Emergency Medicine | Admitting: Emergency Medicine

## 2014-03-31 ENCOUNTER — Emergency Department (HOSPITAL_COMMUNITY): Payer: Self-pay

## 2014-03-31 ENCOUNTER — Encounter (HOSPITAL_COMMUNITY): Payer: Self-pay | Admitting: Emergency Medicine

## 2014-03-31 DIAGNOSIS — M25561 Pain in right knee: Secondary | ICD-10-CM

## 2014-03-31 DIAGNOSIS — Z8742 Personal history of other diseases of the female genital tract: Secondary | ICD-10-CM | POA: Insufficient documentation

## 2014-03-31 DIAGNOSIS — T148XXA Other injury of unspecified body region, initial encounter: Secondary | ICD-10-CM

## 2014-03-31 DIAGNOSIS — Z880 Allergy status to penicillin: Secondary | ICD-10-CM | POA: Insufficient documentation

## 2014-03-31 DIAGNOSIS — Z8719 Personal history of other diseases of the digestive system: Secondary | ICD-10-CM | POA: Insufficient documentation

## 2014-03-31 DIAGNOSIS — Z8679 Personal history of other diseases of the circulatory system: Secondary | ICD-10-CM | POA: Insufficient documentation

## 2014-03-31 DIAGNOSIS — S8990XA Unspecified injury of unspecified lower leg, initial encounter: Secondary | ICD-10-CM | POA: Insufficient documentation

## 2014-03-31 DIAGNOSIS — S8000XA Contusion of unspecified knee, initial encounter: Secondary | ICD-10-CM | POA: Insufficient documentation

## 2014-03-31 DIAGNOSIS — S99929A Unspecified injury of unspecified foot, initial encounter: Secondary | ICD-10-CM

## 2014-03-31 DIAGNOSIS — S42001D Fracture of unspecified part of right clavicle, subsequent encounter for fracture with routine healing: Secondary | ICD-10-CM

## 2014-03-31 DIAGNOSIS — S20219A Contusion of unspecified front wall of thorax, initial encounter: Secondary | ICD-10-CM | POA: Insufficient documentation

## 2014-03-31 DIAGNOSIS — S99919A Unspecified injury of unspecified ankle, initial encounter: Secondary | ICD-10-CM

## 2014-03-31 DIAGNOSIS — Z8659 Personal history of other mental and behavioral disorders: Secondary | ICD-10-CM | POA: Insufficient documentation

## 2014-03-31 DIAGNOSIS — Z862 Personal history of diseases of the blood and blood-forming organs and certain disorders involving the immune mechanism: Secondary | ICD-10-CM | POA: Insufficient documentation

## 2014-03-31 MED ORDER — IBUPROFEN 800 MG PO TABS
800.0000 mg | ORAL_TABLET | Freq: Once | ORAL | Status: AC
  Administered 2014-03-31: 800 mg via ORAL
  Filled 2014-03-31: qty 1

## 2014-03-31 MED ORDER — HYDROCODONE-ACETAMINOPHEN 5-325 MG PO TABS
1.0000 | ORAL_TABLET | Freq: Once | ORAL | Status: AC
  Administered 2014-03-31: 1 via ORAL
  Filled 2014-03-31: qty 1
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
## 2014-03-31 NOTE — ED Provider Notes (Signed)
Medical screening examination/treatment/procedure(s) were performed by non-physician practitioner and as supervising physician I was immediately available for consultation/collaboration.    Dorie Rank, MD 03/31/14 [phone]

## 2014-03-31 NOTE — ED Notes (Signed)
She has numerous areas of discomfort, plus the aforementioned areas of ecchymoses and scrapes, etc. Which I will allow S.A.N.E. To fully document.  She has a fresh abrasion at ant. Right patella area.

## 2014-03-31 NOTE — Discharge Instructions (Signed)
Read the information below.  You may return to the Emergency Department at any time for worsening condition or any new symptoms that concern you.    Abrasion of the knee:  If you develop redness, swelling, pus draining from the wound, or fevers greater than 100.4, return to the ER immediately for a recheck.    Knee pain: If you develop uncontrolled pain, weakness or numbness of the extremity, severe discoloration of the skin, or you are unable to walk, return to the ER for a recheck.    Right clavicle pain: If you develop worsening chest pain, shortness of breath, fever, you pass out, or become weak or dizzy, return to the ER for a recheck.       Assault, General Assault includes any behavior, whether intentional or reckless, which results in bodily injury to another person and/or damage to property. Included in this would be any behavior, intentional or reckless, that by its nature would be understood (interpreted) by a reasonable person as intent to harm another person or to damage his/her property. Threats may be oral or written. They may be communicated through regular mail, computer, fax, or phone. These threats may be direct or implied. FORMS OF ASSAULT INCLUDE:  Physically assaulting a person. This includes physical threats to inflict physical harm as well as:  Slapping.  Hitting.  Poking.  Kicking.  Punching.  Pushing.  Arson.  Sabotage.  Equipment vandalism.  Damaging or destroying property.  Throwing or hitting objects.  Displaying a weapon or an object that appears to be a weapon in a threatening manner.  Carrying a firearm of any kind.  Using a weapon to harm someone.  Using greater physical size/strength to intimidate another.  Making intimidating or threatening gestures.  Bullying.  Hazing.  Intimidating, threatening, hostile, or abusive language directed toward another person.  It communicates the intention to engage in violence against that  person. And it leads a reasonable person to expect that violent behavior may occur.  Stalking another person. IF IT HAPPENS AGAIN:  Immediately call for emergency help (911 in [place]  If someone poses clear and immediate danger to you, seek legal authorities to have a protective or restraining order put in place.  Less threatening assaults can at least be reported to authorities. STEPS TO TAKE IF A SEXUAL ASSAULT HAS HAPPENED  Go to an area of safety. This may include a shelter or staying with a friend. Stay away from the area where you have been attacked. A large percentage of sexual assaults are caused by a friend, relative or associate.  If medications were given by your caregiver, take them as directed for the full length of time prescribed.  Only take over-the-counter or prescription medicines for pain, discomfort, or fever as directed by your caregiver.  If you have come in contact with a sexual disease, find out if you are to be tested again. If your caregiver is concerned about the HIV/AIDS virus, he/she may require you to have continued testing for several months.  For the protection of your privacy, test results can not be given over the phone. Make sure you receive the results of your test. If your test results are not back during your visit, make an appointment with your caregiver to find out the results. Do not assume everything is normal if you have not heard from your caregiver or the medical facility. It is important for you to follow up on all of your test results.  File appropriate papers  with authorities. This is important in all assaults, even if it has occurred in a family or by a friend. SEEK MEDICAL CARE IF:  You have new problems because of your injuries.  You have problems that may be because of the medicine you are taking, such as:  Rash.  Itching.  Swelling.  Trouble breathing.  You develop belly (abdominal) pain, feel sick to your stomach (nausea) or  are vomiting.  You begin to run a temperature.  You need supportive care or referral to a rape crisis center. These are centers with trained personnel who can help you get through this ordeal. SEEK IMMEDIATE MEDICAL CARE IF:  You are afraid of being threatened, beaten, or abused. In [place] call 911.  You receive new injuries related to abuse.  You develop severe pain in any area injured in the assault or have any change in your condition that concerns you.  You faint or lose consciousness.  You develop chest pain or shortness of breath. Document Released: 07/06/2005 Document Revised: 09/28/2011 Document Reviewed: 02/22/2008 [facility] Patient Information 2015 [place], [place]. This information is not intended to replace advice given to you by your health care provider. Make sure you discuss any questions you have with your health care provider.
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
## 2014-03-31 NOTE — ED Notes (Signed)
As I write this, our S.A.N.E. Is still with her.  I get her something for her upper chest and sternal area pain.

## 2014-04-01 ENCOUNTER — Emergency Department (HOSPITAL_COMMUNITY): Payer: Self-pay

## 2014-04-01 ENCOUNTER — Encounter (HOSPITAL_COMMUNITY): Payer: Self-pay | Admitting: Emergency Medicine

## 2014-04-01 ENCOUNTER — Emergency Department (HOSPITAL_COMMUNITY)
Admission: EM | Admit: 2014-04-01 | Discharge: 2014-04-02 | Disposition: A | Discharge status: Home / Self Care | Payer: Self-pay | Attending: Emergency Medicine | Admitting: Emergency Medicine

## 2014-04-01 DIAGNOSIS — G8911 Acute pain due to trauma: Secondary | ICD-10-CM | POA: Insufficient documentation

## 2014-04-01 DIAGNOSIS — R000 Tachycardia, unspecified: Secondary | ICD-10-CM | POA: Insufficient documentation

## 2014-04-01 DIAGNOSIS — R569 Unspecified convulsions: Secondary | ICD-10-CM

## 2014-04-01 DIAGNOSIS — G40909 Epilepsy, unspecified, not intractable, without status epilepticus: Secondary | ICD-10-CM | POA: Insufficient documentation

## 2014-04-01 DIAGNOSIS — F3289 Other specified depressive episodes: Secondary | ICD-10-CM | POA: Insufficient documentation

## 2014-04-01 DIAGNOSIS — Z880 Allergy status to penicillin: Secondary | ICD-10-CM | POA: Insufficient documentation

## 2014-04-01 DIAGNOSIS — F329 Major depressive disorder, single episode, unspecified: Secondary | ICD-10-CM | POA: Insufficient documentation

## 2014-04-01 DIAGNOSIS — M25569 Pain in unspecified knee: Secondary | ICD-10-CM | POA: Insufficient documentation

## 2014-04-01 DIAGNOSIS — Z8742 Personal history of other diseases of the female genital tract: Secondary | ICD-10-CM | POA: Insufficient documentation

## 2014-04-01 DIAGNOSIS — Z8674 Personal history of sudden cardiac arrest: Secondary | ICD-10-CM | POA: Insufficient documentation

## 2014-04-01 DIAGNOSIS — Z862 Personal history of diseases of the blood and blood-forming organs and certain disorders involving the immune mechanism: Secondary | ICD-10-CM | POA: Insufficient documentation

## 2014-04-01 DIAGNOSIS — Z8719 Personal history of other diseases of the digestive system: Secondary | ICD-10-CM | POA: Insufficient documentation

## 2014-04-01 DIAGNOSIS — R079 Chest pain, unspecified: Secondary | ICD-10-CM | POA: Insufficient documentation

## 2014-04-01 DIAGNOSIS — M7981 Nontraumatic hematoma of soft tissue: Secondary | ICD-10-CM | POA: Insufficient documentation

## 2014-04-01 DIAGNOSIS — Z3202 Encounter for pregnancy test, result negative: Secondary | ICD-10-CM | POA: Insufficient documentation

## 2014-04-01 DIAGNOSIS — G8929 Other chronic pain: Secondary | ICD-10-CM | POA: Insufficient documentation

## 2014-04-01 DIAGNOSIS — F411 Generalized anxiety disorder: Secondary | ICD-10-CM | POA: Insufficient documentation

## 2014-04-01 DIAGNOSIS — Z79899 Other long term (current) drug therapy: Secondary | ICD-10-CM | POA: Insufficient documentation

## 2014-04-01 LAB — CBC
HCT: 25.7 % — ABNORMAL LOW (ref 36.0–46.0)
Hemoglobin: 8.6 g/dL — ABNORMAL LOW (ref 12.0–15.0)
MCH: 30.2 pg (ref 26.0–34.0)
MCHC: 33.5 g/dL (ref 30.0–36.0)
MCV: 90.2 fL (ref 78.0–100.0)
PLATELETS: 77 10*3/uL — AB (ref 150–400)
RBC: 2.85 MIL/uL — AB (ref 3.87–5.11)
RDW: 16 % — ABNORMAL HIGH (ref 11.5–15.5)
WBC: 2 10*3/uL — ABNORMAL LOW (ref 4.0–10.5)

## 2014-04-01 LAB — BASIC METABOLIC PANEL
ANION GAP: 16 — AB (ref 5–15)
BUN: 9 mg/dL (ref 6–23)
CO2: 25 meq/L (ref 19–32)
CREATININE: 0.43 mg/dL — AB (ref 0.50–1.10)
Calcium: 8.3 mg/dL — ABNORMAL LOW (ref 8.4–10.5)
Chloride: 101 mEq/L (ref 96–112)
GFR calc Af Amer: >90 mL/min (ref >90)
GFR calc non Af Amer: >90 mL/min (ref >90)
GLUCOSE: 126 mg/dL — AB (ref 70–99)
Potassium: 3.8 mEq/L (ref 3.7–5.3)
Sodium: 142 mEq/L (ref 137–147)

## 2014-04-01 LAB — I-STAT TROPONIN, ED: Troponin i, poc: 0 ng/mL (ref 0.00–0.08)

## 2014-04-01 LAB — I-STAT CG4 LACTIC ACID, ED: Lactic Acid, Venous: 1.75 mmol/L (ref 0.5–2.2)

## 2014-04-01 MED ORDER — HYDROMORPHONE HCL PF 1 MG/ML IJ SOLN
0.5000 mg | Freq: Once | INTRAMUSCULAR | Status: AC
  Administered 2014-04-01: 0.5 mg via INTRAVENOUS
  Filled 2014-04-01: qty 1

## 2014-04-01 MED ORDER — ASPIRIN 81 MG PO CHEW
324.0000 mg | CHEWABLE_TABLET | Freq: Once | ORAL | Status: AC
  Administered 2014-04-01: 324 mg via ORAL
  Filled 2014-04-01: qty 4

## 2014-04-01 NOTE — ED Notes (Signed)
Per significant other, patient has had x2-3 seizures tonight approx 30 sec each. Patient now complaining of central chest pain, states has had pain since CPR was done on her 6 weeks ago but states pain is worse tonight after episodes. Pt. States sternum is broken and right clavicle is broken.
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
## 2014-04-01 NOTE — ED Notes (Signed)
Pt agrees to stay in bed.
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
## 2014-04-01 NOTE — ED Notes (Signed)
Seizure pads on bed

## 2014-04-01 NOTE — ED Notes (Signed)
Per EMS, pt. Had seizure x2, hx of same and is on keppra. ETOH on board. Pt. C/o chest pain after seizures, patient was here for cardiac arrest x6 weeks ago, per patient this was due to potassium.
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
## 2014-04-01 NOTE — ED Notes (Signed)
X-ray at bedside

## 2014-04-01 NOTE — ED Notes (Signed)
PT monitored by pulse ox, bp cuff, and 12-lead. 

## 2014-04-02 LAB — URINALYSIS, ROUTINE W REFLEX MICROSCOPIC
Bilirubin Urine: NEGATIVE
Glucose, UA: NEGATIVE mg/dL
Hgb urine dipstick: NEGATIVE
Ketones, ur: NEGATIVE mg/dL
LEUKOCYTES UA: NEGATIVE
NITRITE: NEGATIVE
PROTEIN: NEGATIVE mg/dL
Specific Gravity, Urine: 1.014 (ref 1.005–1.030)
UROBILINOGEN UA: 0.2 mg/dL (ref 0.0–1.0)
pH: 7 (ref 5.0–8.0)

## 2014-04-02 LAB — POC URINE PREG, ED: PREG TEST UR: NEGATIVE

## 2014-04-02 LAB — RAPID URINE DRUG SCREEN, HOSP PERFORMED
AMPHETAMINES: NOT DETECTED
Barbiturates: NOT DETECTED
Benzodiazepines: NOT DETECTED
Cocaine: NOT DETECTED
Opiates: NOT DETECTED
TETRAHYDROCANNABINOL: NOT DETECTED

## 2014-04-02 LAB — I-STAT TROPONIN, ED: Troponin i, poc: 0 ng/mL (ref 0.00–0.08)

## 2014-04-02 MED ORDER — HYDROMORPHONE HCL PF 1 MG/ML IJ SOLN
0.5000 mg | Freq: Once | INTRAMUSCULAR | Status: AC
Start: 2014-04-02 — End: 2014-04-02
  Administered 2014-04-02: 0.5 mg via INTRAVENOUS

## 2014-04-02 MED ORDER — HYDROMORPHONE HCL PF 1 MG/ML IJ SOLN
0.5000 mg | Freq: Once | INTRAMUSCULAR | Status: AC
Start: 2014-04-02 — End: 2014-04-02
  Administered 2014-04-02: 0.5 mg via INTRAVENOUS
  Filled 2014-04-02: qty 1

## 2014-04-02 MED ORDER — HYDROMORPHONE HCL PF 1 MG/ML IJ SOLN
INTRAMUSCULAR | Status: AC
  Filled 2014-04-02: qty 1

## 2014-04-02 MED ORDER — ONDANSETRON HCL 4 MG/2ML IJ SOLN
4.0000 mg | Freq: Once | INTRAMUSCULAR | Status: AC
  Administered 2014-04-02: 4 mg via INTRAVENOUS
  Filled 2014-04-02: qty 2

## 2014-04-02 NOTE — Discharge Instructions (Signed)
Chest Pain (Nonspecific) It is often hard to give a diagnosis for the cause of chest pain. There is always a chance that your pain could be related to something serious, such as a heart attack or a blood clot in the lungs. You need to follow up with your doctor. HOME CARE  If antibiotic medicine was given, take it as directed by your doctor. Finish the medicine even if you start to feel better.  For the next few days, avoid activities that bring on chest pain. Continue physical activities as told by your doctor.  Do not use any tobacco products. This includes cigarettes, chewing tobacco, and e-cigarettes.  Avoid drinking alcohol.  Only take medicine as told by your doctor.  Follow your doctor's suggestions for more testing if your chest pain does not go away.  Keep all doctor visits you made. GET HELP IF:  Your chest pain does not go away, even after treatment.  You have a rash with blisters on your chest.  You have a fever. GET HELP RIGHT AWAY IF:   You have more pain or pain that spreads to your arm, neck, jaw, back, or belly (abdomen).  You have shortness of breath.  You cough more than usual or cough up blood.  You have very bad back or belly pain.  You feel sick to your stomach (nauseous) or throw up (vomit).  You have very bad weakness.  You pass out (faint).  You have chills. This is an emergency. Do not wait to see if the problems will go away. Call your local emergency services (911 in [place] Do not drive yourself to the hospital. MAKE SURE YOU:   Understand these instructions.  Will watch your condition.  Will get help right away if you are not doing well or get worse. Document Released: 12/23/2007 Document Revised: 07/11/2013 Document Reviewed: 12/23/2007 [employer] Patient Information 2015 [employer], [place]. This information is not intended to replace advice given to you by your health care provider. Make sure you discuss any questions you have with your  health care provider. Seizure, Adult A seizure is abnormal electrical activity in the brain. Seizures usually last from 30 seconds to 2 minutes. There are various types of seizures. Before a seizure, you may have a warning sensation (aura) that a seizure is about to occur. An aura may include the following symptoms:   Fear or anxiety.  Nausea.  Feeling like the room is spinning (vertigo).  Vision changes, such as seeing flashing lights or spots. Common symptoms during a seizure include:  A change in attention or behavior (altered mental status).  Convulsions with rhythmic jerking movements.  Drooling.  Rapid eye movements.  Grunting.  Loss of bladder and bowel control.  Bitter taste in the mouth.  Tongue biting. After a seizure, you may feel confused and sleepy. You may also have an injury resulting from convulsions during the seizure. HOME CARE INSTRUCTIONS   If you are given medicines, take them exactly as prescribed by your health care provider.  Keep all follow-up appointments as directed by your health care provider.  Do not swim or drive or engage in risky activity during which a seizure could cause further injury to you or others until your health care provider says it is OK.  Get adequate rest.  Teach friends and family what to do if you have a seizure. They should:  Lay you on the ground to prevent a fall.  Put a cushion under your head.  Loosen any  tight clothing around your neck.  Turn you on your side. If vomiting occurs, this helps keep your airway clear.  Stay with you until you recover.  Know whether or not you need emergency care. SEEK IMMEDIATE MEDICAL CARE IF:  The seizure lasts longer than 5 minutes.  The seizure is severe or you do not wake up immediately after the seizure.  You have an altered mental status after the seizure.  You are having more frequent or worsening seizures. Someone should drive you to the emergency department or  call local emergency services (911 in [place] MAKE SURE YOU:  Understand these instructions.  Will watch your condition.  Will get help right away if you are not doing well or get worse. Document Released: 07/03/2000 Document Revised: 04/26/2013 Document Reviewed: 02/15/2013 [facility] Patient Information 2015 [place], [place]. This information is not intended to replace advice given to you by your health care provider. Make sure you discuss any questions you have with your health care provider.

## 2014-04-02 NOTE — ED Notes (Signed)
Tolerating PO fluids (sprite x2)

## 2014-04-02 NOTE — ED Provider Notes (Signed)
Medical screening examination/treatment/procedure(s) were performed by non-physician practitioner and as supervising physician I was immediately available for consultation/collaboration.   EKG Interpretation None       Kalman Drape, MD 04/02/14 [phone]

## 2014-04-02 NOTE — ED Notes (Signed)
Pt with steady gait to w/c. Out to car with RN and husband. "feel better", but rates pain the same. Denies questions or needs. No dyspnea noted.

## 2014-04-03 ENCOUNTER — Emergency Department (HOSPITAL_COMMUNITY): Payer: Self-pay

## 2014-04-03 ENCOUNTER — Encounter (HOSPITAL_COMMUNITY): Payer: Self-pay | Admitting: Emergency Medicine

## 2014-04-03 ENCOUNTER — Emergency Department (HOSPITAL_COMMUNITY)
Admission: EM | Admit: 2014-04-03 | Discharge: 2014-04-04 | Disposition: A | Discharge status: Home / Self Care | Payer: Self-pay | Attending: Emergency Medicine | Admitting: Emergency Medicine

## 2014-04-03 ENCOUNTER — Emergency Department (HOSPITAL_COMMUNITY)
Admission: EM | Admit: 2014-04-03 | Discharge: 2014-04-03 | Disposition: A | Discharge status: Home / Self Care | Payer: Self-pay | Attending: Emergency Medicine | Admitting: Emergency Medicine

## 2014-04-03 DIAGNOSIS — R531 Weakness: Secondary | ICD-10-CM

## 2014-04-03 DIAGNOSIS — R1031 Right lower quadrant pain: Secondary | ICD-10-CM | POA: Insufficient documentation

## 2014-04-03 DIAGNOSIS — Z8742 Personal history of other diseases of the female genital tract: Secondary | ICD-10-CM | POA: Insufficient documentation

## 2014-04-03 DIAGNOSIS — F411 Generalized anxiety disorder: Secondary | ICD-10-CM | POA: Insufficient documentation

## 2014-04-03 DIAGNOSIS — Z8679 Personal history of other diseases of the circulatory system: Secondary | ICD-10-CM | POA: Insufficient documentation

## 2014-04-03 DIAGNOSIS — F3289 Other specified depressive episodes: Secondary | ICD-10-CM | POA: Insufficient documentation

## 2014-04-03 DIAGNOSIS — R0789 Other chest pain: Secondary | ICD-10-CM

## 2014-04-03 DIAGNOSIS — Z862 Personal history of diseases of the blood and blood-forming organs and certain disorders involving the immune mechanism: Secondary | ICD-10-CM | POA: Insufficient documentation

## 2014-04-03 DIAGNOSIS — R569 Unspecified convulsions: Secondary | ICD-10-CM | POA: Insufficient documentation

## 2014-04-03 DIAGNOSIS — Z8719 Personal history of other diseases of the digestive system: Secondary | ICD-10-CM | POA: Insufficient documentation

## 2014-04-03 DIAGNOSIS — F329 Major depressive disorder, single episode, unspecified: Secondary | ICD-10-CM | POA: Insufficient documentation

## 2014-04-03 DIAGNOSIS — F10220 Alcohol dependence with intoxication, uncomplicated: Secondary | ICD-10-CM

## 2014-04-03 DIAGNOSIS — R079 Chest pain, unspecified: Secondary | ICD-10-CM | POA: Insufficient documentation

## 2014-04-03 DIAGNOSIS — R1084 Generalized abdominal pain: Secondary | ICD-10-CM | POA: Insufficient documentation

## 2014-04-03 DIAGNOSIS — Z3202 Encounter for pregnancy test, result negative: Secondary | ICD-10-CM | POA: Insufficient documentation

## 2014-04-03 DIAGNOSIS — R109 Unspecified abdominal pain: Secondary | ICD-10-CM | POA: Diagnosis present

## 2014-04-03 DIAGNOSIS — G43909 Migraine, unspecified, not intractable, without status migrainosus: Secondary | ICD-10-CM | POA: Insufficient documentation

## 2014-04-03 DIAGNOSIS — Z880 Allergy status to penicillin: Secondary | ICD-10-CM | POA: Insufficient documentation

## 2014-04-03 DIAGNOSIS — F1010 Alcohol abuse, uncomplicated: Secondary | ICD-10-CM | POA: Insufficient documentation

## 2014-04-03 DIAGNOSIS — F1910 Other psychoactive substance abuse, uncomplicated: Secondary | ICD-10-CM

## 2014-04-03 DIAGNOSIS — R9431 Abnormal electrocardiogram [ECG] [EKG]: Secondary | ICD-10-CM | POA: Insufficient documentation

## 2014-04-03 DIAGNOSIS — Z79899 Other long term (current) drug therapy: Secondary | ICD-10-CM | POA: Insufficient documentation

## 2014-04-03 DIAGNOSIS — Z791 Long term (current) use of non-steroidal anti-inflammatories (NSAID): Secondary | ICD-10-CM | POA: Insufficient documentation

## 2014-04-03 DIAGNOSIS — R188 Other ascites: Secondary | ICD-10-CM | POA: Insufficient documentation

## 2014-04-03 DIAGNOSIS — F1020 Alcohol dependence, uncomplicated: Secondary | ICD-10-CM | POA: Diagnosis present

## 2014-04-03 DIAGNOSIS — G40909 Epilepsy, unspecified, not intractable, without status epilepticus: Secondary | ICD-10-CM | POA: Insufficient documentation

## 2014-04-03 LAB — COMPREHENSIVE METABOLIC PANEL
ALBUMIN: 4.1 g/dL (ref 3.5–5.2)
ALT: 46 U/L — ABNORMAL HIGH (ref 0–35)
ANION GAP: 13 (ref 5–15)
AST: 131 U/L — AB (ref 0–37)
Alkaline Phosphatase: 179 U/L — ABNORMAL HIGH (ref 39–117)
BUN: 7 mg/dL (ref 6–23)
CALCIUM: 8.7 mg/dL (ref 8.4–10.5)
CHLORIDE: 100 meq/L (ref 96–112)
CO2: 26 mEq/L (ref 19–32)
CREATININE: 0.45 mg/dL — AB (ref 0.50–1.10)
GFR calc Af Amer: >90 mL/min (ref >90)
GFR calc non Af Amer: >90 mL/min (ref >90)
Glucose, Bld: 123 mg/dL — ABNORMAL HIGH (ref 70–99)
Potassium: 4 mEq/L (ref 3.7–5.3)
Sodium: 139 mEq/L (ref 137–147)
TOTAL PROTEIN: 6.9 g/dL (ref 6.0–8.3)
Total Bilirubin: 0.5 mg/dL (ref 0.3–1.2)

## 2014-04-03 LAB — I-STAT CHEM 8, ED
BUN: 3 mg/dL — AB (ref 6–23)
Calcium, Ion: 0.98 mmol/L — ABNORMAL LOW (ref 1.12–1.23)
Chloride: 103 mEq/L (ref 96–112)
Creatinine, Ser: 0.8 mg/dL (ref 0.50–1.10)
GLUCOSE: 141 mg/dL — AB (ref 70–99)
HCT: 30 % — ABNORMAL LOW (ref 36.0–46.0)
Hemoglobin: 10.2 g/dL — ABNORMAL LOW (ref 12.0–15.0)
Potassium: 3.4 mEq/L — ABNORMAL LOW (ref 3.7–5.3)
Sodium: 140 mEq/L (ref 137–147)
TCO2: 24 mmol/L (ref 0–100)

## 2014-04-03 LAB — CBC
HEMATOCRIT: 29.2 % — AB (ref 36.0–46.0)
Hemoglobin: 9.5 g/dL — ABNORMAL LOW (ref 12.0–15.0)
MCH: 29.7 pg (ref 26.0–34.0)
MCHC: 32.5 g/dL (ref 30.0–36.0)
MCV: 91.3 fL (ref 78.0–100.0)
Platelets: 83 10*3/uL — ABNORMAL LOW (ref 150–400)
RBC: 3.2 MIL/uL — ABNORMAL LOW (ref 3.87–5.11)
RDW: 16.2 % — AB (ref 11.5–15.5)
WBC: 2.7 10*3/uL — AB (ref 4.0–10.5)

## 2014-04-03 LAB — I-STAT TROPONIN, ED
Troponin i, poc: 0 ng/mL (ref 0.00–0.08)
Troponin i, poc: 0 ng/mL (ref 0.00–0.08)

## 2014-04-03 LAB — LIPASE, BLOOD: Lipase: 51 U/L (ref 11–59)

## 2014-04-03 LAB — MAGNESIUM: Magnesium: 1.8 mg/dL (ref 1.5–2.5)

## 2014-04-03 LAB — ETHANOL: Alcohol, Ethyl (B): 408 mg/dL (ref 0–11)

## 2014-04-03 LAB — PRO B NATRIURETIC PEPTIDE: PRO B NATRI PEPTIDE: 12.8 pg/mL (ref 0–125)

## 2014-04-03 MED ORDER — FOLIC ACID 1 MG PO TABS
1.0000 mg | ORAL_TABLET | Freq: Once | ORAL | Status: AC
  Administered 2014-04-03: 1 mg via ORAL
  Filled 2014-04-03: qty 1

## 2014-04-03 MED ORDER — LORAZEPAM 2 MG/ML IJ SOLN: 1.0000 mg | Freq: Once | INTRAMUSCULAR | Status: DC

## 2014-04-03 MED ORDER — VITAMIN B-1 100 MG PO TABS
500.0000 mg | ORAL_TABLET | Freq: Once | ORAL | Status: AC
  Administered 2014-04-03: 500 mg via ORAL
  Filled 2014-04-03: qty 5

## 2014-04-03 MED ORDER — SODIUM CHLORIDE 0.9 % IV BOLUS (SEPSIS)
1000.0000 mL | Freq: Once | INTRAVENOUS | Status: AC
  Administered 2014-04-03: 1000 mL via INTRAVENOUS

## 2014-04-03 MED ORDER — MAGNESIUM CHLORIDE 64 MG PO TBEC
2.0000 | DELAYED_RELEASE_TABLET | Freq: Once | ORAL | Status: DC
  Filled 2014-04-03: qty 2

## 2014-04-03 MED ORDER — LORAZEPAM 1 MG PO TABS
1.0000 mg | ORAL_TABLET | Freq: Once | ORAL | Status: AC
  Administered 2014-04-03: 1 mg via ORAL
  Filled 2014-04-03: qty 1

## 2014-04-03 MED ORDER — MAGNESIUM LACTATE 84 MG (7MEQ) PO TBCR: 168.0000 mg | EXTENDED_RELEASE_TABLET | Freq: Once | ORAL | Status: DC

## 2014-04-03 MED ORDER — VITAMIN B-12 1000 MCG PO TABS
1000.0000 ug | ORAL_TABLET | Freq: Every day | ORAL | Status: DC
  Filled 2014-04-03: qty 1

## 2014-04-03 MED ORDER — LEVETIRACETAM IN NACL 1500 MG/100ML IV SOLN
1500.0000 mg | Freq: Once | INTRAVENOUS | Status: AC
  Administered 2014-04-03: 1500 mg via INTRAVENOUS
  Filled 2014-04-03: qty 100

## 2014-04-03 NOTE — ED Notes (Signed)
Unable to get urine sample due to patient refused

## 2014-04-03 NOTE — ED Notes (Signed)
Placed pt on bed pan. Pt states she is unable to urinate. Took pt off bed pan. Attempted to ambulate pt. Sat pt up, pt states "I am going to faint, nope not gonna happen" pt lays back down. Unable to ambulate pt.
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
## 2014-04-03 NOTE — ED Notes (Signed)
Provider aware of pt's pain.

## 2014-04-03 NOTE — ED Notes (Signed)
Kepra stopped at 2245.

## 2014-04-03 NOTE — ED Provider Notes (Signed)
  Medical screening examination/treatment/procedure(s) were performed by non-physician practitioner and as supervising physician I was immediately available for consultation/collaboration.   EKG Interpretation None         Carmin Muskrat, MD 04/03/14 [phone]

## 2014-04-03 NOTE — ED Provider Notes (Signed)
CSN: [id number]     Arrival date & time 04/03/14  1208 History   First MD Initiated Contact with Patient 04/03/14 1250     Chief Complaint  Patient presents with  . Chest Pain  . Seizures     (Consider location/radiation/quality/duration/timing/severity/associated sxs/prior Treatment) HPI  Patient with cc of seizure. She is well known to this ED and has 42 visits in the past 6 months. She frequently comes with her boyfriend. Both she and her boyfriend have a longstanding history of chronic alcohol abuse. She had a cardiac arrest on 02/13/2014 after status epilepticus and subsequent Vfib secondary to ETOH withdraw. Patient was here in the ED with her boyfriend who was  Being seen for another complaint. The patient was having difficulty controlling her behavior in her boyfriends room  And was asked to leave by nursing staff. Nurses left the room and boyfriend immediately called out stating that she stopped breathing and had a seizure, which was unwitnessed. The patient was immediately able to ask to be seen without evidence of post ictal behavior She had another seizure like event in the room witnessed by staff. She was jerking her  Her left arm and both legs, however she did not move the right arm where she has a known clavicle fracture, and again had no post ictal state. The patient c/o of Pain in her RUQ which is "making me cry." She states that she only had one drink today because "it is my best friend's birthday." She smells of alcohol and her speech is slurred.    Past Medical History  Diagnosis Date  . Proctitis   . Cysts of both ovaries   . Seizures   . Anemia   . Anxiety   . Blood transfusion without reported diagnosis   . Depression   . Fatty liver 10/05/13  . Cardiac arrest    Past Surgical History  Procedure Laterality Date  . Ovarian cyst removal    . Laparoscopy N/A 09/28/2013    Procedure: LAPAROSCOPY OPERATIVE;  Surgeon: Terrance Mass, MD;  Location: [facility];  Service:  Gynecology;  Laterality: N/A;  . Laparoscopic appendectomy Right 09/28/2013    Procedure: APPENDECTOMY LAPAROSCOPIC;  Surgeon: Terrance Mass, MD;  Location: [facility];  Service: Gynecology;  Laterality: Right;  . Salpingoophorectomy Right 09/28/2013    Procedure: SALPINGO OOPHORECTOMY;  Surgeon: Terrance Mass, MD;  Location: [facility];  Service: Gynecology;  Laterality: Right;  . Colonoscopy N/A 09/30/2013    Procedure: COLONOSCOPY;  Surgeon: Lafayette Dragon, MD;  Location: [facility];  Service: Endoscopy;  Laterality: N/A;  . Esophagogastroduodenoscopy N/A 11/23/2013    Procedure: ESOPHAGOGASTRODUODENOSCOPY (EGD);  Surgeon: Jerene Bears, MD;  Location: [facility];  Service: Endoscopy;  Laterality: N/A;  . Appendectomy     Family History  Problem Relation Age of Onset  . Diabetes Mother   . Hyperlipidemia Mother   . Stroke Mother   . Diabetes Father    History  Substance Use Topics  . Smoking status: Never Smoker   . Smokeless tobacco: Never Used  . Alcohol Use: Yes   OB History   Grav Para Term Preterm Abortions TAB SAB Ect Mult Living   7 3   4  4   3      Review of Systems  Cardiovascular: Negative for chest pain.  Gastrointestinal: Positive for abdominal pain.  Neurological: Positive for seizures.  All other systems reviewed and are negative.     Allergies  Morphine and  related; Tramadol; and Penicillins  Home Medications   Prior to Admission medications   Medication Sig Start Date End Date Taking? Authorizing Provider  FLUoxetine (PROZAC) 20 MG capsule Take 20 mg by mouth daily.   Yes Historical Provider, MD  levETIRAcetam (KEPPRA) 1000 MG tablet Take 1,000 mg by mouth daily.   Yes Historical Provider, MD  traZODone (DESYREL) 50 MG tablet Take 50 mg by mouth at bedtime as needed for sleep.   Yes Historical Provider, MD   LMP 03/22/2014 Physical Exam  Nursing note and vitals reviewed. Constitutional: She is oriented to person, place, and time. She appears  well-developed and well-nourished. No distress.  HENT:  Head: Normocephalic and atraumatic.  Mouth/Throat: Oropharynx is clear and moist.  Eyes: Conjunctivae and EOM are normal. Pupils are equal, round, and reactive to light. No scleral icterus.  No horizontal, vertical or rotational nystagmus  Neck: Normal range of motion. Neck supple.  Cardiovascular: Normal rate, regular rhythm and intact distal pulses.   Pulmonary/Chest: Effort normal and breath sounds normal. No respiratory distress. She has no wheezes. She has no rales.  Abdominal: Soft. Bowel sounds are normal. There is tenderness (TTP RUQ). There is no rebound and no guarding.  Musculoskeletal: Normal range of motion.  Lymphadenopathy:    She has no cervical adenopathy.  Neurological: She is alert and oriented to person, place, and time. She has normal reflexes. No cranial nerve deficit. She exhibits normal muscle tone. Coordination normal.  Mental Status:  Alert, oriented, thought content appropriate. Speech Slurred without evidence of aphasia. Able to follow 2 step commands without difficulty.  Cranial Nerves:  II:  Peripheral visual fields grossly normal, pupils equal, round, reactive to light III,IV, VI: ptosis not present, extra-ocular motions intact bilaterally  V,VII: smile symmetric, facial light touch sensation equal VIII: hearing grossly normal bilaterally  IX,X: gag reflex present  XI: bilateral shoulder shrug equal and strong XII: midline tongue extension  Motor:  5/5 in upper and lower extremities bilaterally including strong and equal grip strength and dorsiflexion/plantar flexion Sensory: Pinprick and light touch normal in all extremities.  Deep Tendon Reflexes: 2+ and symmetric  Cerebellar: normal finger-to-nose with bilateral upper extremities Gait: normal gait and balance CV: distal pulses palpable throughout   Skin: Skin is warm and dry. No rash noted. She is not diaphoretic.  Psychiatric: She has a normal  mood and affect. Her behavior is normal. Judgment and thought content normal.    ED Course  Procedures (including critical care time) Labs Review Labs Reviewed - No data to display  Imaging Review Dg Chest Portable 1 View  04/02/2014   CLINICAL DATA:  CHEST PAIN SEIZURES  EXAM: PORTABLE CHEST - 1 VIEW  COMPARISON:  Prior study from 03/31/2014  FINDINGS: The cardiac and mediastinal silhouettes are stable in size and contour, and remain within normal limits.  The lungs are normally inflated. No airspace consolidation, pleural effusion, or pulmonary edema is identified. There is no pneumothorax.  No acute osseous abnormality identified.  IMPRESSION: No active disease.   Electronically Signed   By: Jeannine Boga M.D.   On: 04/02/2014 00:06     EKG Interpretation None        Date: 04/03/2014  Rate: 99  Rhythm: normal sinus rhythm  QRS Axis: normal  Intervals: borderline prolonged QT  ST/T Wave abnormalities: normal  Conduction Disutrbances:none  Narrative Interpretation:   Old EKG Reviewed: unchanged   MDM   Final diagnoses:  Observed seizure-like activity  Prolonged Q-T interval  on ECG    Patient seizures today are not consistent with true seizure or even complex seizure activity. She has a borderline prolonged QT Differential DX for  Prolonged QT includes: hypokalemia, hypomagnesemia, hypocalcemia, elevated ICP, sodium channel blockade, hypothermia, congenital long QT syndrome, acute ischemia. I feel most likely this is due to mag insufficieny given her history of etoh abuse. Patient asked to leave as soon as her boyfriend was discharge.  Given PO ativan, magnesium, thiamine, folate, and B12. I have discussed that patient's case with both Dr. Vanita Panda and Dr. Zenia Resides who are the attending physicians in the ER today. I do not feel that her abdominal pain warrants work up  As seizure would not produce abdominal pain and the patient  Has no vomiting. She does have hx of  fatty liver. No signs of abnormality on EKG. Blood work otherwise unremarkable. cxr negative.  The patient appears reasonably screened and/or stabilized for discharge and I doubt any other medical condition or other [facility] requiring further screening, evaluation, or treatment in the ED at this time prior to discharge.      Margarita Mail, PA-C 04/03/14 [phone]

## 2014-04-03 NOTE — SANE Note (Signed)
ON 04/03/2014, AT APPROXIMATELY 0135 HOURS, AN EMAIL REFERRAL WAS SENT TO SHAY HAGAR AT [facility] FOR THIS PT.  PER THE RN'S DOCUMENTATION, THE PT'S FRIEND'S CELL NUMBER WAS USED AS A CONTACT (FRIEND IS 'DREW'), AND THE CELL NUMBER WAS:  [phone].

## 2014-04-03 NOTE — ED Notes (Signed)
XRAY unable to obtain xray in radiology, pt states "I am about to have a seizure". Pt transported back to room. Will order a portable chest xray.

## 2014-04-03 NOTE — ED Notes (Signed)
Pt refused to stay to receive discharge papers, pt walked out and stated "I will go somewhere I will receive care".

## 2014-04-03 NOTE — ED Provider Notes (Signed)
CSN: [id number]     Arrival date & time 04/03/14  2123 History   First MD Initiated Contact with Patient 04/03/14 2133     Chief Complaint  Patient presents with  . Seizures  . Chest Pain     (Consider location/radiation/quality/duration/timing/severity/associated sxs/prior Treatment) The history is provided by the patient, a significant other and medical records. No language interpreter was used.    Kristen Roberson is a 38 y.o. female  with a hx of alcohol abuse, seizures, anxiety.  Patient is well-known to the emergency department with 43 visits in the past 6 months. She frequently comes with her significant other who also has a history of alcohol abuse. She suffered a cardiac arrest on 02/13/2014 after status epilepticus and subsequent Vfib secondary to ETOH withdrawal.  Patient was evaluated for seizures and abdominal pain earlier today.  Record review shows that seizure activity earlier today was not consistent with true seizure or complex seizure activity.  She did have a prolonged QT on her EKG and was given by mouth Ativan, magnesium, thiamine, folate and B12 prior to discharge approximately 4:30 this afternoon.    She presents to the Emergency Department at this time complaining of witnessed seizures at [facility] x3 by the boyfriend onset earlier this afternoon.  Pt reports after returning home she fell and hit her chest on the doorframe and then later had 3 more seizures without incontinence, postictal phase, or falling/hitting her head (as he caught her each time).   She reports missing at least 4 doses of her keppra in the last week.  Associated symptoms include bruising to the right chest from her CA several weeks ago.  Nothing makes it better and movement and palpation makes it worse.  Patient also complaining of generalized abdominal pain she reports began 9:30 tonight and reports that she always gets Dilaudid for her abdominal pain.   Pt denies fever, chills, headache, neck pain,  shortness of breath, nausea, vomiting, diarrhea, weakness, dizziness, dysuria.  LMP:02/18/14.     Past Medical History  Diagnosis Date  . Proctitis   . Cysts of both ovaries   . Seizures   . Anemia   . Anxiety   . Blood transfusion without reported diagnosis   . Depression   . Fatty liver 10/05/13  . Cardiac arrest    Past Surgical History  Procedure Laterality Date  . Ovarian cyst removal    . Laparoscopy N/A 09/28/2013    Procedure: LAPAROSCOPY OPERATIVE;  Surgeon: Terrance Mass, MD;  Location: [facility];  Service: Gynecology;  Laterality: N/A;  . Laparoscopic appendectomy Right 09/28/2013    Procedure: APPENDECTOMY LAPAROSCOPIC;  Surgeon: Terrance Mass, MD;  Location: [facility];  Service: Gynecology;  Laterality: Right;  . Salpingoophorectomy Right 09/28/2013    Procedure: SALPINGO OOPHORECTOMY;  Surgeon: Terrance Mass, MD;  Location: [facility];  Service: Gynecology;  Laterality: Right;  . Colonoscopy N/A 09/30/2013    Procedure: COLONOSCOPY;  Surgeon: Lafayette Dragon, MD;  Location: [facility];  Service: Endoscopy;  Laterality: N/A;  . Esophagogastroduodenoscopy N/A 11/23/2013    Procedure: ESOPHAGOGASTRODUODENOSCOPY (EGD);  Surgeon: Jerene Bears, MD;  Location: [facility];  Service: Endoscopy;  Laterality: N/A;  . Appendectomy     Family History  Problem Relation Age of Onset  . Diabetes Mother   . Hyperlipidemia Mother   . Stroke Mother   . Diabetes Father    History  Substance Use Topics  . Smoking status: Never Smoker   .  Smokeless tobacco: Never Used  . Alcohol Use: Yes   OB History   Grav Para Term Preterm Abortions TAB SAB Ect Mult Living   7 3   4  4   3      Review of Systems  Constitutional: Negative for fever, diaphoresis, appetite change, fatigue and unexpected weight change.  HENT: Negative for mouth sores.   Eyes: Negative for visual disturbance.  Respiratory: Negative for cough, chest tightness, shortness of breath and wheezing.   Cardiovascular:  Positive for chest pain.  Gastrointestinal: Negative for nausea, vomiting, abdominal pain, diarrhea and constipation.  Endocrine: Negative for polydipsia, polyphagia and polyuria.  Genitourinary: Negative for dysuria, urgency, frequency and hematuria.  Musculoskeletal: Negative for back pain and neck stiffness.  Skin: Negative for rash.  Allergic/Immunologic: Negative for immunocompromised state.  Neurological: Positive for seizures. Negative for syncope, light-headedness and headaches.  Hematological: Does not bruise/bleed easily.  Psychiatric/Behavioral: Negative for sleep disturbance. The patient is not nervous/anxious.       Allergies  Morphine and related; Tramadol; and Penicillins  Home Medications   Prior to Admission medications   Medication Sig Start Date End Date Taking? Authorizing Provider  acetaminophen (TYLENOL) 500 MG tablet Take 500 mg by mouth every 6 (six) hours as needed for moderate pain.   Yes Historical Provider, MD  FLUoxetine (PROZAC) 20 MG capsule Take 40 mg by mouth daily.    Yes Historical Provider, MD  iron polysaccharides (NIFEREX) 150 MG capsule Take 150 mg by mouth daily.   Yes Historical Provider, MD  levETIRAcetam (KEPPRA) 1000 MG tablet Take 1,000 mg by mouth 2 (two) times daily.    Yes Historical Provider, MD  magnesium oxide (MAG-OX) 400 MG tablet Take 400 mg by mouth 3 (three) times daily.   Yes Historical Provider, MD  Menthol (HALLS COUGH DROPS MT) Use as directed 1 lozenge in the mouth or throat daily as needed (for cough).   Yes Historical Provider, MD  naproxen sodium (ANAPROX) 220 MG tablet Take 220 mg by mouth daily as needed (for pain).   Yes Historical Provider, MD  traZODone (DESYREL) 50 MG tablet Take 50 mg by mouth at bedtime as needed for sleep.   Yes Historical Provider, MD   BP 122/87  Pulse 114  Temp(Src) 98.2 F (36.8 C)  Resp 28  Ht 5\' 6"  (1.676 m)  Wt 125 lb (56.7 kg)  BMI 20.19 kg/m2  SpO2 100%  LMP 03/21/2014 Physical  Exam  Nursing note and vitals reviewed. Constitutional: She is oriented to person, place, and time. She appears well-developed and well-nourished. No distress.  Awake, alert, nontoxic appearance  HENT:  Head: Normocephalic and atraumatic.  Mouth/Throat: Oropharynx is clear and moist. No oropharyngeal exudate.  Eyes: Conjunctivae and EOM are normal. Pupils are equal, round, and reactive to light. No scleral icterus.  No horizontal, vertical or rotational nystagmus  Neck: Normal range of motion. Neck supple.  Full active and passive ROM without pain No midline or paraspinal tenderness No nuchal rigidity or meningeal signs  Cardiovascular: Normal rate, regular rhythm, normal heart sounds and intact distal pulses.   No murmur heard. Pulmonary/Chest: Effort normal and breath sounds normal. No respiratory distress. She has no wheezes. She has no rales. She exhibits tenderness (right anterior chest).    Equal chest expansion  Abdominal: Soft. Bowel sounds are normal. She exhibits no mass. There is tenderness. There is no rebound, no guarding and no CVA tenderness.  Generalized abdominal tenderness not reproduced with patient distraction No  CVA tenderness Abdomen soft without rigidity or rebound  Musculoskeletal: Normal range of motion. She exhibits no edema.  Lymphadenopathy:    She has no cervical adenopathy.  Neurological: She is alert and oriented to person, place, and time. She has normal reflexes. No cranial nerve deficit. She exhibits normal muscle tone. Coordination normal.  Mental Status:  Alert, oriented, thought content appropriate. Speech fluent without evidence of aphasia. Able to follow 2 step commands without difficulty.  Cranial Nerves:  II:  Peripheral visual fields grossly normal, pupils equal, round, reactive to light III,IV, VI: ptosis not present, extra-ocular motions intact bilaterally  V,VII: smile symmetric, facial light touch sensation equal VIII: hearing grossly  normal bilaterally  IX,X: gag reflex present  XI: bilateral shoulder shrug equal and strong XII: midline tongue extension  Motor:  3/5 in right upper and lower extremity including grip strength and dorsiflexion/plantar flexion 5/5 in left upper and lower extremity including grip strength and dorsiflexion/plantar flexionSensory: Pinprick and light touch normal in all extremities.  Deep Tendon Reflexes: 2+ and symmetric  Cerebellar: normal finger-to-nose with bilateral upper extremities Gait: deferred as pt refused  Skin: Skin is warm and dry. No rash noted. She is not diaphoretic.  Psychiatric: She has a normal mood and affect. Her behavior is normal. Judgment and thought content normal.    ED Course  Procedures (including critical care time) Labs Review Labs Reviewed  CBC - Abnormal; Notable for the following:    WBC 2.7 (*)    RBC 3.20 (*)    Hemoglobin 9.5 (*)    HCT 29.2 (*)    RDW 16.2 (*)    Platelets 83 (*)    All other components within normal limits  COMPREHENSIVE METABOLIC PANEL - Abnormal; Notable for the following:    Glucose, Bld 123 (*)    Creatinine, Ser 0.45 (*)    AST 131 (*)    ALT 46 (*)    Alkaline Phosphatase 179 (*)    All other components within normal limits  ETHANOL - Abnormal; Notable for the following:    Alcohol, Ethyl (B) 408 (*)    All other components within normal limits  PRO B NATRIURETIC PEPTIDE  MAGNESIUM  LIPASE, BLOOD  URINALYSIS, ROUTINE W REFLEX MICROSCOPIC  I-STAT TROPOININ, ED  CBG MONITORING, ED  POC URINE PREG, ED    Imaging Review Dg Chest 2 View  04/03/2014   CLINICAL DATA:  Chest pain and difficulty breathing  EXAM: CHEST  2 VIEW  COMPARISON:  April 01, 2014  FINDINGS: Lungs are clear. Heart size and pulmonary vascularity are normal. No adenopathy. No pneumothorax. No bone lesions.  IMPRESSION: No abnormality noted.   Electronically Signed   By: Lowella Grip M.D.   On: 04/03/2014 13:39   Dg Chest Portable 1  View  04/03/2014   CLINICAL DATA:  Right-sided chest pain.  EXAM: PORTABLE CHEST - 1 VIEW  COMPARISON:  Chest radiograph performed 04/03/2014  FINDINGS: The lungs are well-aerated and clear. There is no evidence of focal opacification, pleural effusion or pneumothorax.  The cardiomediastinal silhouette is within normal limits. No acute osseous abnormalities are seen.  IMPRESSION: No acute cardiopulmonary process seen.   Electronically Signed   By: Garald Balding M.D.   On: 04/03/2014 23:09     EKG Interpretation   Date/Time:  Tuesday April 03 2014 21:39:45 EDT Ventricular Rate:  107 PR Interval:  145 QRS Duration: 91 QT Interval:  391 QTC Calculation: 522 R Axis:   70 Text  Interpretation:  Sinus tachycardia Prolonged QT interval No  significant change was found Confirmed by Wyvonnia Dusky  MD, STEPHEN [phone]) on  04/03/2014 9:46:55 PM      MDM   Final diagnoses:  Substance abuse  ETOH abuse  Right sided weakness  Convulsions, unspecified convulsion type  Generalized abdominal pain  Right-sided chest wall pain   Kristen Roberson presents with right-sided chest pain, complaints of generalized abdominal pain and complaints of persistent seizures.  Patient reports missing several doses of Keppra in the last week. Will load tonight.  Patient with prolonged QT 522 increased from 400s earlier today. Will check magnesium level and basic labs.  Patient demanding Dilaudid. I discussed with her that she will not be given any narcotic pain medication at this time.  Pt with right sided weakness and refuses to walk stating that she is going to pass out.  Will consult Neurology.  She has returned to her baseline mental status, but weakness persists.    12:26 AM Pt was discussed with Dr. Doy Mince who requests overnight obs until patient has returned to baseline or is sober enough for a more accurate neurologic exam.  CT head pending and neurology will evaluate.  Pt remains tachycardic.    Discussed with  Dr. Jennette Kettle who will evaluate.  He declines to have temp admission orders placed at this time.    BP 122/87  Pulse 114  Temp(Src) 98.2 F (36.8 C)  Resp 28  Ht 5\' 6"  (1.676 m)  Wt 125 lb (56.7 kg)  BMI 20.19 kg/m2  SpO2 100%  LMP 03/21/2014  The patient was discussed with and seen by Dr. Wyvonnia Dusky who agrees with the treatment plan.    Jarrett Soho Mariellen Blaney, PA-C 04/04/14 0030

## 2014-04-03 NOTE — Discharge Instructions (Signed)
Long QT Syndrome Long QT syndrome is a disorder of the heart's electrical system. Long QT syndrome affects the process that allows the heart to recharge itself after each heartbeat (repolarization). In long QT syndrome, the heart takes longer to recharge, which can lead to:  A very fast heart rhythm (arrhythmia).  Fainting (syncope).  Sudden death. Long QT syndrome can be either acquired or present at birth (congenital). Congenital long QT syndrome is either associated with deafness at birth (Jervell and Lang-Nielsen syndrome), which is rare, or not associated with deafness (Romano-Ward syndrome), which is the most common type. RISK FACTORS  Deafness at birth.  Family history of experienced unexplained fainting or sudden cardiac death.  Use of certain medicines. CAUSES  Acquired long QT syndrome can be caused by abnormal electrolyte levels, such as low potassium levels and low magnesium levels. It can also be caused by the use of certain medicines. These medicines can include:  Antihistamines.  Antidepressants and psychotropic drugs.  Antiarrhythmics.  Antibiotics, antifungals, and antivirals.  Gastrointestinal medicines.  Diuretics.  High blood pressure medicines.  Cholesterol-lowering medicines.  Migraine medicines. DIAGNOSIS  Different kinds of tests can be used to diagnose long QT syndrome. These include:  Electrocardiography, which records the heart's electrical activity.  Holter monitor, which records your heartbeat and can help diagnose heart arrhythmias.  Stress tests by exercise or by giving medicine that makes the heart beat faster.  Genetic tests. TREATMENT  Treatment of long QT syndrome may involve:  Stopping the use of medicines that may be the cause.  Correcting abnormal electrolyte levels.  Correcting abnormal thyroid levels.  Use of heart medicines such as beta blockers.  An implantable cardioverter-defibrillator. This is a device that can  shock a fast heart rate into a normal heart rhythm. SEEK IMMEDIATE MEDICAL CARE IF:  You have chest pain that feels like squeezing or pressure.  You feel faint or like you are going to pass out.  You feel your heart racing or skipping beats.  You have shortness of breath. MAKE SURE YOU:   Understand these instructions.  Will watch your condition.  Will get help right away if you are not doing well or get worse. Document Released: 05/03/2009 Document Revised: 09/28/2011 Document Reviewed: 05/03/2009 [facility] Patient Information 2015 [place], [place]. This information is not intended to replace advice given to you by your health care provider. Make sure you discuss any questions you have with your health care provider.  Driving and Equipment Restrictions Some medical problems make it dangerous to drive, ride a bike, or use machines. Some of these problems are:  A hard blow to the head (concussion).  Passing out (fainting).  Twitching and shaking (seizures).  Low blood sugar.  Taking medicine to help you relax (sedatives).  Taking pain medicines.  Wearing an eye patch.  Wearing splints. This can make it hard to use parts of your body that you need to drive safely. HOME CARE   Do not drive until your doctor says it is okay.  Do not use machines until your doctor says it is okay. You may need a form signed by your doctor (medical release) before you can drive again. You may also need this form before you do other tasks where you need to be fully alert. MAKE SURE YOU:  Understand these instructions.  Will watch your condition.  Will get help right away if you are not doing well or get worse. Document Released: 08/13/2004 Document Revised: 09/28/2011 Document Reviewed: 11/13/2009 [employer] Patient  Information 2015 [place], [place]. This information is not intended to replace advice given to you by your health care provider. Make sure you discuss any questions you have with  your health care provider. Alcohol Withdrawal Anytime drug use is interfering with normal living activities it has become abuse. This includes problems with family and friends. Psychological dependence has developed when your mind tells you that the drug is needed. This is usually followed by physical dependence when a continuing increase of drugs are required to get the same feeling or "high." This is known as addiction or chemical dependency. A person's risk is much higher if there is a history of chemical dependency in the family. Mild Withdrawal Following Stopping Alcohol, When Addiction or Chemical Dependency Has Developed When a person has developed tolerance to alcohol, any sudden stopping of alcohol can cause uncomfortable physical symptoms. Most of the time these are mild and consist of tremors in the hands and increases in heart rate, breathing, and temperature. Sometimes these symptoms are associated with anxiety, panic attacks, and bad dreams. There may also be stomach upset. Normal sleep patterns are often interrupted with periods of inability to sleep (insomnia). This may last for 6 months. Because of this discomfort, many people choose to continue drinking to get rid of this discomfort and to try to feel normal. Severe Withdrawal with Decreased or No Alcohol Intake, When Addiction or Chemical Dependency Has Developed About five percent of alcoholics will develop signs of severe withdrawal when they stop using alcohol. One sign of this is development of generalized seizures (convulsions). Other signs of this are severe agitation and confusion. This may be associated with believing in things which are not real or seeing things which are not really there (delusions and hallucinations). Vitamin deficiencies are usually present if alcohol intake has been long-term. Treatment for this most often requires hospitalization and close observation. Addiction can only be helped by stopping use of all  chemicals. This is hard but may save your life. With continual alcohol use, possible outcomes are usually loss of self respect and esteem, violence, and death. Addiction cannot be cured but it can be stopped. This often requires outside help and the care of professionals. Treatment centers are listed in the yellow pages under Cocaine, Narcotics, and Alcoholics Anonymous. Most hospitals and clinics can refer you to a specialized care center. It is not necessary for you to go through the uncomfortable symptoms of withdrawal. Your caregiver can provide you with medicines that will help you through this difficult period. Try to avoid situations, friends, or drugs that made it possible for you to keep using alcohol in the past. Learn how to say no. It takes a long period of time to overcome addictions to all drugs, including alcohol. There may be many times when you feel as though you want a drink. After getting rid of the physical addiction and withdrawal, you will have a lessening of the craving which tells you that you need alcohol to feel normal. Call your caregiver if more support is needed. Learn who to talk to in your family and among your friends so that during these periods you can receive outside help. Alcoholics Anonymous (AA) has helped many people over the years. To get further help, contact AA or call your caregiver, counselor, or clergyperson. Al-Anon and Alateen are support groups for friends and family members of an alcoholic. The people who love and care for an alcoholic often need help, too. For information about these organizations, check  your phone directory or call a local alcoholism treatment center.  SEEK IMMEDIATE MEDICAL CARE IF:   You have a seizure.  You have a fever.  You experience uncontrolled vomiting or you vomit up blood. This may be bright red or look like black coffee grounds.  You have blood in the stool. This may be bright red or appear as a black, tarry, bad-smelling  stool.  You become lightheaded or faint. Do not drive if you feel this way. Have someone else drive you or call 631 for help.  You become more agitated or confused.  You develop uncontrolled anxiety.  You begin to see things that are not really there (hallucinate). Your caregiver has determined that you completely understand your medical condition, and that your mental state is back to normal. You understand that you have been treated for alcohol withdrawal, have agreed not to drink any alcohol for a minimum of 1 day, will not operate a car or other machinery for 24 hours, and have had an opportunity to ask any questions about your condition. Document Released: 04/15/2005 Document Revised: 09/28/2011 Document Reviewed: 02/22/2008 [facility] Patient Information 2015 [place], [place]. This information is not intended to replace advice given to you by your health care provider. Make sure you discuss any questions you have with your health care provider.

## 2014-04-03 NOTE — ED Notes (Signed)
MD at bedside. 

## 2014-04-03 NOTE — ED Notes (Signed)
Bed: WA17 Expected date:  Expected time:  Means of arrival:  Comments: Pt's visitor

## 2014-04-03 NOTE — ED Notes (Signed)
Patient transported to X-ray 

## 2014-04-03 NOTE — ED Notes (Signed)
Pt was here with her fiance when he called out for help stating pt suddenly stopped breathing. He sts pt had "cardiac arrest couple of weeks ago and I broke her sternum doing compressions". Pt stated she wants to be checked in and was brought to room 17. Once in the room pt proceeded to shake her body as if having seizure, however she was guarding her injured arm throughout it.

## 2014-04-03 NOTE — ED Notes (Signed)
PA at [facility].

## 2014-04-04 ENCOUNTER — Ambulatory Visit (INDEPENDENT_AMBULATORY_CARE_PROVIDER_SITE_OTHER): Payer: Self-pay | Admitting: Neurology

## 2014-04-04 ENCOUNTER — Encounter: Payer: Self-pay | Admitting: Neurology

## 2014-04-04 ENCOUNTER — Emergency Department (HOSPITAL_COMMUNITY): Payer: Self-pay

## 2014-04-04 ENCOUNTER — Encounter (HOSPITAL_COMMUNITY): Payer: Self-pay | Admitting: Radiology

## 2014-04-04 VITALS — BP 112/80 | HR 89 | Resp 16 | Ht 66.0 in | Wt 133.0 lb

## 2014-04-04 DIAGNOSIS — F10229 Alcohol dependence with intoxication, unspecified: Secondary | ICD-10-CM

## 2014-04-04 DIAGNOSIS — R1084 Generalized abdominal pain: Secondary | ICD-10-CM

## 2014-04-04 DIAGNOSIS — R569 Unspecified convulsions: Secondary | ICD-10-CM | POA: Insufficient documentation

## 2014-04-04 DIAGNOSIS — F1910 Other psychoactive substance abuse, uncomplicated: Secondary | ICD-10-CM

## 2014-04-04 LAB — URINALYSIS, ROUTINE W REFLEX MICROSCOPIC
Bilirubin Urine: NEGATIVE
GLUCOSE, UA: NEGATIVE mg/dL
Ketones, ur: NEGATIVE mg/dL
Leukocytes, UA: NEGATIVE
Nitrite: NEGATIVE
Protein, ur: NEGATIVE mg/dL
SPECIFIC GRAVITY, URINE: 1.017 (ref 1.005–1.030)
Urobilinogen, UA: 0.2 mg/dL (ref 0.0–1.0)
pH: 6.5 (ref 5.0–8.0)

## 2014-04-04 LAB — POC URINE PREG, ED: Preg Test, Ur: NEGATIVE

## 2014-04-04 LAB — URINE MICROSCOPIC-ADD ON

## 2014-04-04 LAB — CBG MONITORING, ED: Glucose-Capillary: 127 mg/dL — ABNORMAL HIGH (ref 70–99)

## 2014-04-04 MED ORDER — LEVETIRACETAM 1000 MG PO TABS: ORAL_TABLET | ORAL | Status: DC

## 2014-04-04 NOTE — ED Provider Notes (Signed)
Medical screening examination/treatment/procedure(s) were conducted as a shared visit with non-physician practitioner(s) and myself.  I personally evaluated the patient during the encounter.  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 Seen at [facility] earlier today. States she fell and injured her right ribs. Has had chronic rib pain since her cardiac arrest 2 months ago. 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 She denies any shortness of breath, fever or cough. Admits to some indiscretions with her keppra compliance. She is awake and alert. No postictal phase. Denies any head or neck pain.  On exam she has right-sided weakness questionable effort. CN 2-12 intact. 5/5 strength on L She says she's had this intermittently since her cardiac arrest. She's complaining of right lower rib pain. Her abdomen is soft and nontender. She is requesting pain medications.  Right-sided weakness after questionable seizure versus pseudoseizure. History of the same. Alcohol intoxication with level greater than 400. doubt any evidence of alcohol withdrawal seizures. Discussed with Dr. Doy Mince who is seen patient agrees and agrees with keppra load. Dr. Alcario Drought of Hospital service will evaluate for observation admission as she is not yet back to baseline and still has ongoing R sided weakness. CT head negative.  BP 106/69  Pulse 76  Temp(Src) 98.3 F (36.8 C) (Oral)  Resp 13  Ht 5\' 6"  (1.676 m)  Wt 125 lb (56.7 kg)  BMI 20.19 kg/m2  SpO2 99%  LMP 03/21/2014    EKG Interpretation   Date/Time:  Tuesday April 03 2014 21:39:45 EDT Ventricular Rate:  107 PR Interval:  145 QRS Duration: 91 QT Interval:  391 QTC Calculation: 522 R Axis:   70 Text Interpretation:  Sinus tachycardia Prolonged QT interval No  significant change was found Confirmed by Wyvonnia Dusky  MD, Ifrah Vest [phone]) on  04/03/2014 9:46:55 PM         Ezequiel Essex, MD 04/04/14 [phone]
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
## 2014-04-04 NOTE — ED Notes (Signed)
Pt still c/o ABD pain, MD aware, pt to be seen by Neurology today at 11 am. No order gotten for pain medication.

## 2014-04-04 NOTE — Consult Note (Signed)
Reason for Consult:Seizures Referring Physician: Rancour  CC: Seizures  HPI: Kristen Roberson is an 38 y.o. female with a history of seizures and ETOH abuse who was at [facility] with her boyfriend earlier today.  Had been asked to leave by staff secondary to her behavior and was then noted to have a seizure (head turned to the side and foaming at the mouth) by her boyfriend (not witnessed by staff).  Was seen at that time and while being evaluated had another seizure.  Events were not felt to be epileptic and patient was released.  Patient was complaining of abdominal pain at that time.  Boyfriend reports that they drank alcohol later in the day (admits to the equivalent of 6-7 drinks).  Abdominal pain has continued and patient had 3 more seizures at home and a fall.  Her biggest complaint at this time is pain.    Patient was admitted and intubated in July for seizures.  Per report of boyfriend the patient has had right sided numbness and weakness since that time and has had intermittent slurred speech.   She is noncompliant with her Keppra.  Past Medical History  Diagnosis Date  . Proctitis   . Cysts of both ovaries   . Seizures   . Anemia   . Anxiety   . Blood transfusion without reported diagnosis   . Depression   . Fatty liver 10/05/13  . Cardiac arrest     Past Surgical History  Procedure Laterality Date  . Ovarian cyst removal    . Laparoscopy N/A 09/28/2013    Procedure: LAPAROSCOPY OPERATIVE;  Surgeon: Terrance Mass, MD;  Location: [facility];  Service: Gynecology;  Laterality: N/A;  . Laparoscopic appendectomy Right 09/28/2013    Procedure: APPENDECTOMY LAPAROSCOPIC;  Surgeon: Terrance Mass, MD;  Location: [place] [facility];  Service: Gynecology;  Laterality: Right;  . Salpingoophorectomy Right 09/28/2013    Procedure: SALPINGO OOPHORECTOMY;  Surgeon: Terrance Mass, MD;  Location: [place] [facility];  Service: Gynecology;  Laterality: Right;  . Colonoscopy N/A 09/30/2013    Procedure: COLONOSCOPY;  Surgeon:  Lafayette Dragon, MD;  Location: [facility];  Service: Endoscopy;  Laterality: N/A;  . Esophagogastroduodenoscopy N/A 11/23/2013    Procedure: ESOPHAGOGASTRODUODENOSCOPY (EGD);  Surgeon: Jerene Bears, MD;  Location: [facility];  Service: Endoscopy;  Laterality: N/A;  . Appendectomy      Family History  Problem Relation Age of Onset  . Diabetes Mother   . Hyperlipidemia Mother   . Stroke Mother   . Diabetes Father     Social History:  reports that she has never smoked. She has never used smokeless tobacco. She reports that she drinks alcohol. She reports that she does not use illicit drugs.  Allergies  Allergen Reactions  . Morphine And Related Anaphylaxis     Tolerated hydromorphone on 11/25/13.   . Tramadol Other (See Comments)    Seizures   . Penicillins Other (See Comments)    Unknown childhood reaction.    Medications: I have reviewed the patient's current medications. Prior to Admission:  Current outpatient prescriptions: acetaminophen (TYLENOL) 500 MG tablet, Take 500 mg by mouth every 6 (six) hours as needed for moderate pain., Disp: , Rfl: ;   FLUoxetine (PROZAC) 20 MG capsule, Take 40 mg by mouth daily. , Disp: , Rfl: ;   iron polysaccharides (NIFEREX) 150 MG capsule, Take 150 mg by mouth daily., Disp: , Rfl: ;   levETIRAcetam (KEPPRA) 1000 MG tablet, Take 1,000 mg by mouth  2 (two) times daily. , Disp: , Rfl:  magnesium oxide (MAG-OX) 400 MG tablet, Take 400 mg by mouth 3 (three) times daily., Disp: , Rfl: ;   Menthol (HALLS COUGH DROPS MT), Use as directed 1 lozenge in the mouth or throat daily as needed (for cough)., Disp: , Rfl: ;   naproxen sodium (ANAPROX) 220 MG tablet, Take 220 mg by mouth daily as needed (for pain)., Disp: , Rfl: ;   traZODone (DESYREL) 50 MG tablet, Take 50 mg by mouth at bedtime as needed for sleep., Disp: , Rfl:   ROS: History obtained from the patient  General ROS: negative for - chills, fatigue, fever, night sweats, weight gain or weight  loss Psychological ROS: negative for - behavioral disorder, hallucinations, memory difficulties, mood swings or suicidal ideation Ophthalmic ROS: negative for - blurry vision, double vision, eye pain or loss of vision ENT ROS: negative for - epistaxis, nasal discharge, oral lesions, sore throat, tinnitus or vertigo Allergy and Immunology ROS: negative for - hives or itchy/watery eyes Hematological and Lymphatic ROS: negative for - bleeding problems, bruising or swollen lymph nodes Endocrine ROS: negative for - galactorrhea, hair pattern changes, polydipsia/polyuria or temperature intolerance Respiratory ROS: negative for - cough, hemoptysis, shortness of breath or wheezing Cardiovascular ROS: negative for - chest pain, dyspnea on exertion, edema or irregular heartbeat Gastrointestinal ROS: abdominal pain Genito-Urinary ROS: negative for - dysuria, hematuria, incontinence or urinary frequency/urgency Musculoskeletal ROS: negative for - joint swelling or muscular weakness Neurological ROS: as noted in HPI Dermatological ROS: negative for rash and skin lesion changes  Physical Examination: Blood pressure 122/87, pulse 114, temperature 98.4 F (36.9 C), temperature source Oral, resp. rate 28, height 5\' 6"  (1.676 m), weight 56.7 kg (125 lb), last menstrual period 03/21/2014, SpO2 100.00%.  Neurologic Examination Mental Status: Alert, oriented, thought content appropriate.  Speech fluent without evidence of aphasia.  Able to follow 3 step commands without difficulty.  Complaining of pain. Cranial Nerves: II: Discs flat bilaterally; Visual fields grossly normal, pupils equal, round, reactive to light and accommodation III,IV, VI: ptosis not present, extra-ocular motions intact bilaterally V,VII: smile symmetric, facial light touch sensation normal bilaterally VIII: hearing normal bilaterally IX,X: gag reflex present XI: bilateral shoulder shrug XII: midline tongue extension Motor: Left:      Upper extremity   5/5  Lower extremity   5/5   On using the right the patient use the arm and leg well spontaneously when moving up in bed and addressing her pain.  When formal testing done the patient makes very little effort on using the right.  No pronator drift noted.      Tone and bulk:normal tone throughout; no atrophy noted Sensory: Pinprick and light touch decreased in the RUE and RLE Deep Tendon Reflexes: 2+ and symmetric throughout Plantars: Right: downgoing   Left: downgoing Cerebellar: normal finger-to-nose and normal heel-to-shin testing Gait: patient reports that she is unable to ambulate CV: pulses palpable throughout     Laboratory Studies:   Basic Metabolic Panel:  Recent Labs Lab 04/01/14 2203 04/03/14 1327 04/03/14 2155  NA 142 140 139  K 3.8 3.4* 4.0  CL 101 103 100  CO2 25  --  26  GLUCOSE 126* 141* 123*  BUN 9 3* 7  CREATININE 0.43* 0.80 0.45*  CALCIUM 8.3*  --  8.7  MG  --   --  1.8    Liver Function Tests:  Recent Labs Lab 04/03/14 2155  AST 131*  ALT 46*  ALKPHOS 179*  BILITOT 0.5  PROT 6.9  ALBUMIN 4.1    Recent Labs Lab 04/03/14 2155  LIPASE 51   No results found for this basename: AMMONIA,  in the last 168 hours  CBC:  Recent Labs Lab 04/01/14 2203 04/03/14 1327 04/03/14 2155  WBC 2.0*  --  2.7*  HGB 8.6* 10.2* 9.5*  HCT 25.7* 30.0* 29.2*  MCV 90.2  --  91.3  PLT 77*  --  83*    Cardiac Enzymes: No results found for this basename: CKTOTAL, CKMB, CKMBINDEX, TROPONINI,  in the last 168 hours  BNP: No components found with this basename: POCBNP,   CBG:  Recent Labs Lab 04/03/14 2152  [place] 127*    Microbiology: Results for orders placed during the hospital encounter of 02/13/14  MRSA PCR SCREENING     Status: None   Collection Time    02/14/14 12:27 AM      Result Value Ref Range Status   MRSA by PCR NEGATIVE  NEGATIVE Final   Comment:            The GeneXpert MRSA Assay (FDA     approved for NASAL  specimens     only), is one component of a     comprehensive MRSA colonization     surveillance program. It is not     intended to diagnose MRSA     infection nor to guide or     monitor treatment for     MRSA infections.    Coagulation Studies: No results found for this basename: LABPROT, INR,  in the last 72 hours  Urinalysis:  Recent Labs Lab 04/02/14 0048 04/04/14 0046  COLORURINE YELLOW YELLOW  LABSPEC 1.014 1.017  PHURINE 7.0 6.5  GLUCOSEU NEGATIVE NEGATIVE  HGBUR NEGATIVE MODERATE*  BILIRUBINUR NEGATIVE NEGATIVE  KETONESUR NEGATIVE NEGATIVE  PROTEINUR NEGATIVE NEGATIVE  UROBILINOGEN 0.2 0.2  NITRITE NEGATIVE NEGATIVE  LEUKOCYTESUR NEGATIVE NEGATIVE    Lipid Panel:  No results found for this basename: chol, trig, hdl, cholhdl, vldl, ldlcalc    HgbA1C:  Lab Results  Component Value Date   HGBA1C 4.4 12/09/2013    Urine Drug Screen:     Component Value Date/Time   LABOPIA NONE DETECTED 04/02/2014 0048   LABOPIA NEGATIVE 02/15/2014 1127   COCAINSCRNUR NONE DETECTED 04/02/2014 0048   COCAINSCRNUR NEGATIVE 02/15/2014 1127   LABBENZ NONE DETECTED 04/02/2014 0048   LABBENZ POSITIVE* 02/15/2014 1127   AMPHETMU NONE DETECTED 04/02/2014 0048   AMPHETMU NEGATIVE 02/15/2014 1127   THCU NONE DETECTED 04/02/2014 0048   LABBARB NONE DETECTED 04/02/2014 0048    Alcohol Level:  Recent Labs Lab 04/03/14 2155  ETH 408*    Other results: EKG: sinus tachycardia at 107 bpm.  Imaging: Dg Chest 2 View  04/03/2014   CLINICAL DATA:  Chest pain and difficulty breathing  EXAM: CHEST  2 VIEW  COMPARISON:  April 01, 2014  FINDINGS: Lungs are clear. Heart size and pulmonary vascularity are normal. No adenopathy. No pneumothorax. No bone lesions.  IMPRESSION: No abnormality noted.   Electronically Signed   By: Lowella Grip M.D.   On: 04/03/2014 13:39   Dg Chest Portable 1 View  04/03/2014   CLINICAL DATA:  Right-sided chest pain.  EXAM: PORTABLE CHEST - 1 VIEW  COMPARISON:   Chest radiograph performed 04/03/2014  FINDINGS: The lungs are well-aerated and clear. There is no evidence of focal opacification, pleural effusion or pneumothorax.  The cardiomediastinal silhouette is within normal limits. No  acute osseous abnormalities are seen.  IMPRESSION: No acute cardiopulmonary process seen.   Electronically Signed   By: Garald Balding M.D.   On: 04/03/2014 23:09     Assessment/Plan: 38 year old female well known to our facility with a history of ETOH abuse and seizures (?epileptic and nonepileptic).  Patient reports multiple breakthrough seizures today.  On Keppra at home but has been noncompliant.  Based on ETOH level not likely a ETOH withdrawal component.  Recommendations: 1.  Seizure precautions 2.  Keppra 1000mg  IV now 3.  Would continue home Keppra dose po   Alexis Goodell, MD [facility] [phone] 04/04/2014, 1:54 AM

## 2014-04-04 NOTE — Consult Note (Addendum)
[facility] Medical Consultation  Kristen Roberson IOE:[id number] DOB: [birth year] DOA: 04/03/2014 PCP: Angelica Chessman, MD   Requesting physician: EDP Date of consultation: 04/04/14 Reason for consultation: R sided weakness  Impression/Recommendations Active Problems:   Abdominal pain   Alcohol dependence   Convulsions/seizures    1. Abdominal pain - patient with chronic and ongoing abdominal pain, this has been worked up extensively before including upper and lower endoscopy.  Recommend outpatient GI follow up and pain control per what ever her outpatient chronic pain medications are. 2. 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  Only gets actual epileptic seizures when she is going through EtOH withdrawal (which she did last month and had associated V.Fib arrest).  Thankfully she is not going through EtOH withdrawal at this time either clinically, nor with a BAL of over 400.  Patient admitted that she was non-compliant today with her Keppra, was loaded with Keppra in ED.  Spoke with Neurology, they have no interest in doing any further workup on this patient at this time if she is not having weakness.  No need for imaging nor observation. 3. EtOH dependence - would offer patient admission for detox given her history of V.Fib arrest with detox previously but patient dosent really have any interest in detox with [place] at this time.    Chief Complaint: Abdominal pain  HPI:  38 yo F with history of seizures and EtOH abuse who was at [facility] with her boyfriend earlier today.  Had to be asked to leave by staff secondary to behavior and then noted to have a seizure by boyfriend.  Was seen by staff and then at that time had another seizure (non-epileptic) and so was released.  She returns to the ED at [facility] with c/o abdominal pain.  There is also a report by  boyfriend of R sided weakness that prompted concern of need for work up and admission; however, on my exam she clearly denies this multiple times to me and demonstrates no evidence of this.   Review of Systems:  12 systems reviewed and otherwise negative.  Past Medical History  Diagnosis Date  . Proctitis   . Cysts of both ovaries   . Seizures   . Anemia   . Anxiety   . Blood transfusion without reported diagnosis   . Depression   . Fatty liver 10/05/13  . Cardiac arrest    Past Surgical History  Procedure Laterality Date  . Ovarian cyst removal    . Laparoscopy N/A 09/28/2013    Procedure: LAPAROSCOPY OPERATIVE;  Surgeon: Terrance Mass, MD;  Location: [place] [facility];  Service: Gynecology;  Laterality: N/A;  . Laparoscopic appendectomy Right 09/28/2013    Procedure: APPENDECTOMY LAPAROSCOPIC;  Surgeon: Terrance Mass, MD;  Location: [place];  Service: Gynecology;  Laterality: Right;  . Salpingoophorectomy Right 09/28/2013    Procedure: SALPINGO OOPHORECTOMY;  Surgeon: Terrance Mass, MD;  Location: [place];  Service: Gynecology;  Laterality: Right;  . Colonoscopy N/A 09/30/2013    Procedure: COLONOSCOPY;  Surgeon: Lafayette Dragon, MD;  Location: [facility];  Service: Endoscopy;  Laterality: N/A;  . Esophagogastroduodenoscopy N/A 11/23/2013    Procedure: ESOPHAGOGASTRODUODENOSCOPY (EGD);  Surgeon: Jerene Bears, MD;  Location: [facility];  Service: Endoscopy;  Laterality: N/A;  . Appendectomy     Social History:  reports that she has  never smoked. She has never used smokeless tobacco. She reports that she drinks alcohol. She reports that she does not use illicit drugs.  Allergies  Allergen Reactions  . Morphine And Related Anaphylaxis     Tolerated hydromorphone on 11/25/13.   . Tramadol Other (See Comments)    Seizures   . Penicillins Other (See Comments)    Unknown childhood reaction.   Family History  Problem Relation Age of Onset  . Diabetes Mother   . Hyperlipidemia Mother    . Stroke Mother   . Diabetes Father     Prior to Admission medications   Medication Sig Start Date End Date Taking? Authorizing Provider  acetaminophen (TYLENOL) 500 MG tablet Take 500 mg by mouth every 6 (six) hours as needed for moderate pain.   Yes Historical Provider, MD  FLUoxetine (PROZAC) 20 MG capsule Take 40 mg by mouth daily.    Yes Historical Provider, MD  iron polysaccharides (NIFEREX) 150 MG capsule Take 150 mg by mouth daily.   Yes Historical Provider, MD  levETIRAcetam (KEPPRA) 1000 MG tablet Take 1,000 mg by mouth 2 (two) times daily.    Yes Historical Provider, MD  magnesium oxide (MAG-OX) 400 MG tablet Take 400 mg by mouth 3 (three) times daily.   Yes Historical Provider, MD  Menthol (HALLS COUGH DROPS MT) Use as directed 1 lozenge in the mouth or throat daily as needed (for cough).   Yes Historical Provider, MD  naproxen sodium (ANAPROX) 220 MG tablet Take 220 mg by mouth daily as needed (for pain).   Yes Historical Provider, MD  traZODone (DESYREL) 50 MG tablet Take 50 mg by mouth at bedtime as needed for sleep.   Yes Historical Provider, MD   Physical Exam: Blood pressure 122/87, pulse 114, temperature 98.4 F (36.9 C), temperature source Oral, resp. rate 28, height 5\' 6"  (1.676 m), weight 56.7 kg (125 lb), last menstrual period 03/21/2014, SpO2 100.00%. Filed Vitals:   04/04/14 0046  BP:   Pulse:   Temp: 98.4 F (36.9 C)  Resp:     General:  NAD, resting comfortably in bed Eyes: PEERLA EOMI ENT: mucous membranes moist Neck: supple w/o JVD Cardiovascular: RRR w/o MRG Respiratory: CTA B Abdomen: soft, nt, nd, bs+ Skin: no rash nor lesion Musculoskeletal: MAE, full ROM all 4 extremities Psychiatric: normal tone and affect Neurologic: AAOx3, grossly non-focal  Labs on Admission:  Basic Metabolic Panel:  Recent Labs Lab 04/01/14 2203 04/03/14 1327 04/03/14 2155  NA 142 140 139  K 3.8 3.4* 4.0  CL 101 103 100  CO2 25  --  26  GLUCOSE 126* 141* 123*   BUN 9 3* 7  CREATININE 0.43* 0.80 0.45*  CALCIUM 8.3*  --  8.7  MG  --   --  1.8   Liver Function Tests:  Recent Labs Lab 04/03/14 2155  AST 131*  ALT 46*  ALKPHOS 179*  BILITOT 0.5  PROT 6.9  ALBUMIN 4.1    Recent Labs Lab 04/03/14 2155  LIPASE 51   No results found for this basename: AMMONIA,  in the last 168 hours CBC:  Recent Labs Lab 04/01/14 2203 04/03/14 1327 04/03/14 2155  WBC 2.0*  --  2.7*  HGB 8.6* 10.2* 9.5*  HCT 25.7* 30.0* 29.2*  MCV 90.2  --  91.3  PLT 77*  --  83*   Cardiac Enzymes: No results found for this basename: CKTOTAL, CKMB, CKMBINDEX, TROPONINI,  in the last 168 hours BNP: No components found  with this basename: POCBNP,  CBG:  Recent Labs Lab 04/03/14 2152  GLUCAP 127*    Radiological Exams on Admission: Dg Chest 2 View  04/03/2014   CLINICAL DATA:  Chest pain and difficulty breathing  EXAM: CHEST  2 VIEW  COMPARISON:  April 01, 2014  FINDINGS: Lungs are clear. Heart size and pulmonary vascularity are normal. No adenopathy. No pneumothorax. No bone lesions.  IMPRESSION: No abnormality noted.   Electronically Signed   By: Lowella Grip M.D.   On: 04/03/2014 13:39   Ct Head Wo Contrast  04/04/2014   CLINICAL DATA:  Seizure.  EXAM: CT HEAD WITHOUT CONTRAST  TECHNIQUE: Contiguous axial images were obtained from the base of the skull through the vertex without intravenous contrast.  COMPARISON:  CTA of the head performed 02/13/2014  FINDINGS: There is no evidence of acute infarction, mass lesion, or intra- or extra-axial hemorrhage on CT.  Mild chronic ischemic changes are seen with regard to the external capsule bilaterally.  The posterior fossa, including the cerebellum, brainstem and fourth ventricle, is within normal limits. The third and lateral ventricles are unremarkable in appearance. The cerebral hemispheres are symmetric in appearance, with normal gray-white differentiation. No mass effect or midline shift is seen.  There  is no evidence of fracture; visualized osseous structures are unremarkable in appearance. The orbits are within normal limits. The paranasal sinuses and mastoid air cells are well-aerated. No significant soft tissue abnormalities are seen.  IMPRESSION: 1. No acute intracranial pathology seen on CT. 2. Mild chronic ischemic change with regard to the external capsule bilaterally.   Electronically Signed   By: Garald Balding M.D.   On: 04/04/2014 02:12   Dg Chest Portable 1 View  04/03/2014   CLINICAL DATA:  Right-sided chest pain.  EXAM: PORTABLE CHEST - 1 VIEW  COMPARISON:  Chest radiograph performed 04/03/2014  FINDINGS: The lungs are well-aerated and clear. There is no evidence of focal opacification, pleural effusion or pneumothorax.  The cardiomediastinal silhouette is within normal limits. No acute osseous abnormalities are seen.  IMPRESSION: No acute cardiopulmonary process seen.   Electronically Signed   By: Garald Balding M.D.   On: 04/03/2014 23:09    EKG: Independently reviewed.  Time spent: 70 min  GARDNER, JARED M. [facility] Pager [phone]  If 7PM-7AM, please contact night-coverage [email or web address] Password [email or web address] 04/04/2014, 2:35 AM

## 2014-04-04 NOTE — Patient Instructions (Signed)
1. Increase Keppra 1000mg : Take 1-1/2 tablets twice a day 2. Schedule MRI brain with and without contrast 3. Schedule routine EEG  Seizure Precautions: 1. If medication has been prescribed for you to prevent seizures, take it exactly as directed.  Do not stop taking the medicine without talking to your doctor first, even if you have not had a seizure in a long time.   2. Avoid activities in which a seizure would cause danger to yourself or to others.  Don't operate dangerous machinery, swim alone, or climb in high or dangerous places, such as on ladders, roofs, or girders.  Do not drive unless your doctor says you may.  3. If you have any warning that you may have a seizure, lay down in a safe place where you can't hurt yourself.    4.  No driving for 6 months from last seizure, as per [facility].   Please refer to the following link on the [place] website for more information: http://[email or web address]/[email or web address]/driving/drivingu.cfm   5.  Maintain good sleep hygiene.  6.  Notify your neurology if you are planning pregnancy or if you become pregnant.  7.  Contact your doctor if you have any problems that may be related to the medicine you are taking.  8.  Call 911 and bring the patient back to the ED if:        A.  The seizure lasts longer than 5 minutes.       B.  The patient doesn't awaken shortly after the seizure  C.  The patient has new problems such as difficulty seeing, speaking or moving  D.  The patient was injured during the seizure  E.  The patient has a temperature over 102 F (39C)  F.  The patient vomited and now is having trouble breathing

## 2014-04-05 ENCOUNTER — Encounter (HOSPITAL_COMMUNITY): Payer: Self-pay | Admitting: Emergency Medicine

## 2014-04-05 ENCOUNTER — Emergency Department (HOSPITAL_COMMUNITY)
Admission: EM | Admit: 2014-04-05 | Discharge: 2014-04-05 | Disposition: A | Discharge status: Home / Self Care | Payer: Self-pay | Attending: Emergency Medicine | Admitting: Emergency Medicine

## 2014-04-05 ENCOUNTER — Emergency Department (HOSPITAL_COMMUNITY): Payer: Self-pay

## 2014-04-05 DIAGNOSIS — R0789 Other chest pain: Secondary | ICD-10-CM

## 2014-04-05 DIAGNOSIS — F411 Generalized anxiety disorder: Secondary | ICD-10-CM | POA: Insufficient documentation

## 2014-04-05 DIAGNOSIS — Z79899 Other long term (current) drug therapy: Secondary | ICD-10-CM | POA: Insufficient documentation

## 2014-04-05 DIAGNOSIS — F329 Major depressive disorder, single episode, unspecified: Secondary | ICD-10-CM | POA: Insufficient documentation

## 2014-04-05 DIAGNOSIS — G40909 Epilepsy, unspecified, not intractable, without status epilepticus: Secondary | ICD-10-CM | POA: Insufficient documentation

## 2014-04-05 DIAGNOSIS — Z8719 Personal history of other diseases of the digestive system: Secondary | ICD-10-CM | POA: Insufficient documentation

## 2014-04-05 DIAGNOSIS — D649 Anemia, unspecified: Secondary | ICD-10-CM | POA: Insufficient documentation

## 2014-04-05 DIAGNOSIS — S298XXA Other specified injuries of thorax, initial encounter: Secondary | ICD-10-CM | POA: Insufficient documentation

## 2014-04-05 DIAGNOSIS — Y9389 Activity, other specified: Secondary | ICD-10-CM | POA: Insufficient documentation

## 2014-04-05 DIAGNOSIS — F3289 Other specified depressive episodes: Secondary | ICD-10-CM | POA: Insufficient documentation

## 2014-04-05 DIAGNOSIS — G8929 Other chronic pain: Secondary | ICD-10-CM | POA: Insufficient documentation

## 2014-04-05 DIAGNOSIS — Y9289 Other specified places as the place of occurrence of the external cause: Secondary | ICD-10-CM | POA: Insufficient documentation

## 2014-04-05 DIAGNOSIS — Z880 Allergy status to penicillin: Secondary | ICD-10-CM | POA: Insufficient documentation

## 2014-04-05 DIAGNOSIS — Z8674 Personal history of sudden cardiac arrest: Secondary | ICD-10-CM | POA: Insufficient documentation

## 2014-04-05 DIAGNOSIS — W1809XA Striking against other object with subsequent fall, initial encounter: Secondary | ICD-10-CM | POA: Insufficient documentation

## 2014-04-05 DIAGNOSIS — Z8742 Personal history of other diseases of the female genital tract: Secondary | ICD-10-CM | POA: Insufficient documentation

## 2014-04-05 MED ORDER — IBUPROFEN 200 MG PO TABS
400.0000 mg | ORAL_TABLET | Freq: Once | ORAL | Status: AC
  Administered 2014-04-05: 400 mg via ORAL
  Filled 2014-04-05: qty 2
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
## 2014-04-05 NOTE — ED Notes (Signed)
Bed: WA16 Expected date:  Expected time:  Means of arrival:  Comments: RES A 

## 2014-04-05 NOTE — ED Notes (Signed)
Patient transported to X-ray 
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
## 2014-04-05 NOTE — Discharge Instructions (Signed)
You have been diagnosed by your caregiver as having chest wall pain. SEEK IMMEDIATE MEDICAL ATTENTION IF: You develop a fever.  Your chest pains become severe or intolerable.  You develop new, unexplained symptoms (problems).  You develop shortness of breath, nausea, vomiting, sweating or feel light headed.  You develop a new cough or you cough up blood. Be sure to call your caregiver and arrange for follow-up care as suggested by our staff. RETURN IMMEDIATELY IF DEVELOP threat to harm self or others, suicidal or homicidal thoughts, hallucinations or confusion, unable to be cared for at home or uncontrolled behavior, or other concerns.   Substance Abuse Treatment Programs  Intensive Outpatient Programs [facility]     [address], [place]       [facility] [address] [place], [place]  [facility]     (Inpatient and outpatient)     [address]           [place] [phone] (Suboxone and Methadone)  [place], [place] [address]      [place] [address] [place], [place]  [facility] (Outpatient/Inpatient, [place])    (insurance only) [phone]             [facility] ([place]) [place], [place]     [facility]     [address]     [place], [place]       [facility] (for caregivers and family) [phone] [address] Kristeen Mans. [place], [place]      Residential Treatment Programs [facility]      [address] [place], [place] [address]  [phone]       [address] [place], [place] [address] [phone]  [facility] [place]        [phone]       [facility] [phone]  [facility] ([place].)             [place], [place] or [place] of [place] [address] [place], [address] [phone]  [facility] [place]    [address]      [place], [place]       [facility] [address] [place], [place]  [place]   [address] [place], [place] [address]  [phone]      Admissions: 8am-3pm M-F  [facility] (RTS) [place], [place]  [facility] Program: Residential Program ([address])   [place], [place] or [phone]     [facility]C: [place], [place] (Walk in Hours over the weekend or by referral)  [facility] [place], [place], [place] [address] [phone]  Crisis Mobile: [facility]:  [phone] (for crisis response 24 hours a day) [facility] Hotline:      [phone] Outpatient Psychiatry and Counseling  [facility]: Mobile Crisis Management 24 hours:  [phone]  [facility] of the [employer] sliding scale fee and walk in schedule: M-F 8am-12pm/1pm-3pm [place], [place] [address] [place], [place] [address] [phone]  [facility] (Formerly known as [facility] [employer])- new patient walk-in appointments available Monday - Friday 8am -3pm.          [address] [place], [place] [address] [phone] or crisis line- [facility]/ Intensive Outpatient Therapy Program [place], [place] [address] [place]      [phone] [address] [place], [place] [address]                 [place]   [facility] [phone]. [place], [place] [address]   [employer] of Care          [address] Johnette Abraham  [place], [place] [address]       [phone]  [facility] [address] [place], [place] [address] [phone]  [facility]    [address] [place], [place] [address]     [place], [place] Joycelyn Man     [place] [address]     [phone]       [facility] [place] [address]  [facility]     [address], [place], [place], [place] [address] [place]     [address]     [place], [place] [address]     [phone]       [facility] [address] [place], [place] [address] [phone] Resources for Temporary Residential Assistance/Crisis [facility]) M-F 8am-3pm   [address] [place], [place] [address]   [phone] Services include: laundry, barbering, support groups, case management, phone  & computer access, showers, AA/NA mtgs, mental health/substance abuse nurse, job skills class, disability information, VA assistance, spiritual classes, etc.   [facility]   [address], [place]     [place]              [employer] (women and children)  [place], [place] [address] [phone] [email or web address] for application and process Application Required  [facility]   [address]    [place] [address]     [phone]                    [place], [place] [address] [phone]([email or web address] application appt.) Application Required  [facility] (women only)    [address]     [place], [place] [address]     [phone]      Intake starts 6pm daily Need valid ID, SSC, & Police report [employer] [address] [place], [place] [phone] Application Required  [employer] (men only)     [place].      [place], [place]       [place] (Pregnant women only) [address] [name], [place]  [facility]      [place] [address].      [place], [place] [address]     [phone]             [facility] [address] [place], [place] 90 day commitment/SA/Application process  [facility](men only)     [address]     [place], [place]       Check-in at [facility] [address] [place], [place] [address] [phone] Men/Women/Women and Children must be there by 7 pm  [place], [place]
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
## 2014-04-15 ENCOUNTER — Encounter (HOSPITAL_COMMUNITY): Payer: Self-pay | Admitting: Emergency Medicine

## 2014-04-15 ENCOUNTER — Emergency Department (HOSPITAL_COMMUNITY): Payer: Self-pay

## 2014-04-15 ENCOUNTER — Inpatient Hospital Stay (HOSPITAL_COMMUNITY)
Admission: EM | Admit: 2014-04-15 | Discharge: 2014-04-17 | DRG: 556 | Disposition: A | Discharge status: Home / Self Care | Payer: Self-pay | Attending: Internal Medicine | Admitting: Internal Medicine

## 2014-04-15 DIAGNOSIS — R531 Weakness: Secondary | ICD-10-CM | POA: Diagnosis present

## 2014-04-15 DIAGNOSIS — IMO0002 Reserved for concepts with insufficient information to code with codable children: Secondary | ICD-10-CM

## 2014-04-15 DIAGNOSIS — X58XXXA Exposure to other specified factors, initial encounter: Secondary | ICD-10-CM | POA: Diagnosis present

## 2014-04-15 DIAGNOSIS — F3289 Other specified depressive episodes: Secondary | ICD-10-CM | POA: Diagnosis present

## 2014-04-15 DIAGNOSIS — R4789 Other speech disturbances: Secondary | ICD-10-CM | POA: Diagnosis present

## 2014-04-15 DIAGNOSIS — F329 Major depressive disorder, single episode, unspecified: Secondary | ICD-10-CM | POA: Diagnosis present

## 2014-04-15 DIAGNOSIS — S1093XA Contusion of unspecified part of neck, initial encounter: Secondary | ICD-10-CM

## 2014-04-15 DIAGNOSIS — Z885 Allergy status to narcotic agent status: Secondary | ICD-10-CM

## 2014-04-15 DIAGNOSIS — F1010 Alcohol abuse, uncomplicated: Secondary | ICD-10-CM

## 2014-04-15 DIAGNOSIS — S0083XA Contusion of other part of head, initial encounter: Secondary | ICD-10-CM | POA: Diagnosis present

## 2014-04-15 DIAGNOSIS — Z8674 Personal history of sudden cardiac arrest: Secondary | ICD-10-CM

## 2014-04-15 DIAGNOSIS — Z880 Allergy status to penicillin: Secondary | ICD-10-CM

## 2014-04-15 DIAGNOSIS — I4901 Ventricular fibrillation: Secondary | ICD-10-CM | POA: Diagnosis present

## 2014-04-15 DIAGNOSIS — F3340 Major depressive disorder, recurrent, in remission, unspecified: Secondary | ICD-10-CM

## 2014-04-15 DIAGNOSIS — G40909 Epilepsy, unspecified, not intractable, without status epilepticus: Secondary | ICD-10-CM | POA: Diagnosis present

## 2014-04-15 DIAGNOSIS — K7689 Other specified diseases of liver: Secondary | ICD-10-CM | POA: Diagnosis present

## 2014-04-15 DIAGNOSIS — F32A Depression, unspecified: Secondary | ICD-10-CM

## 2014-04-15 DIAGNOSIS — Z7982 Long term (current) use of aspirin: Secondary | ICD-10-CM

## 2014-04-15 DIAGNOSIS — R569 Unspecified convulsions: Secondary | ICD-10-CM

## 2014-04-15 DIAGNOSIS — G8190 Hemiplegia, unspecified affecting unspecified side: Secondary | ICD-10-CM | POA: Diagnosis present

## 2014-04-15 DIAGNOSIS — E440 Moderate protein-calorie malnutrition: Secondary | ICD-10-CM | POA: Diagnosis present

## 2014-04-15 DIAGNOSIS — S0093XA Contusion of unspecified part of head, initial encounter: Secondary | ICD-10-CM

## 2014-04-15 DIAGNOSIS — S0003XA Contusion of scalp, initial encounter: Secondary | ICD-10-CM | POA: Diagnosis present

## 2014-04-15 DIAGNOSIS — R29898 Other symptoms and signs involving the musculoskeletal system: Principal | ICD-10-CM | POA: Diagnosis present

## 2014-04-15 DIAGNOSIS — G8929 Other chronic pain: Secondary | ICD-10-CM | POA: Diagnosis present

## 2014-04-15 DIAGNOSIS — F411 Generalized anxiety disorder: Secondary | ICD-10-CM | POA: Diagnosis present

## 2014-04-15 DIAGNOSIS — R259 Unspecified abnormal involuntary movements: Secondary | ICD-10-CM | POA: Diagnosis present

## 2014-04-15 DIAGNOSIS — F1020 Alcohol dependence, uncomplicated: Secondary | ICD-10-CM | POA: Diagnosis present

## 2014-04-15 DIAGNOSIS — F10920 Alcohol use, unspecified with intoxication, uncomplicated: Secondary | ICD-10-CM

## 2014-04-15 DIAGNOSIS — D649 Anemia, unspecified: Secondary | ICD-10-CM | POA: Diagnosis present

## 2014-04-15 LAB — DIFFERENTIAL
Basophils Absolute: 0.1 10*3/uL (ref 0.0–0.1)
Basophils Relative: 2 % — ABNORMAL HIGH (ref 0–1)
Eosinophils Absolute: 0 10*3/uL (ref 0.0–0.7)
Eosinophils Relative: 0 % (ref 0–5)
LYMPHS ABS: 1.3 10*3/uL (ref 0.7–4.0)
Lymphocytes Relative: 47 % — ABNORMAL HIGH (ref 12–46)
MONOS PCT: 9 % (ref 3–12)
Monocytes Absolute: 0.3 10*3/uL (ref 0.1–1.0)
NEUTROS ABS: 1.2 10*3/uL — AB (ref 1.7–7.7)
NEUTROS PCT: 42 % — AB (ref 43–77)

## 2014-04-15 LAB — I-STAT CHEM 8, ED
BUN: <3 mg/dL — ABNORMAL LOW (ref 6–23)
CALCIUM ION: 1.02 mmol/L — AB (ref 1.12–1.23)
CHLORIDE: 104 meq/L (ref 96–112)
Creatinine, Ser: 0.9 mg/dL (ref 0.50–1.10)
Glucose, Bld: 110 mg/dL — ABNORMAL HIGH (ref 70–99)
HEMATOCRIT: 38 % (ref 36.0–46.0)
Hemoglobin: 12.9 g/dL (ref 12.0–15.0)
Potassium: 4.1 mEq/L (ref 3.7–5.3)
Sodium: 141 mEq/L (ref 137–147)
TCO2: 25 mmol/L (ref 0–100)

## 2014-04-15 LAB — COMPREHENSIVE METABOLIC PANEL
ALK PHOS: 158 U/L — AB (ref 39–117)
ALT: 23 U/L (ref 0–35)
ANION GAP: 17 — AB (ref 5–15)
AST: 57 U/L — ABNORMAL HIGH (ref 0–37)
Albumin: 4.6 g/dL (ref 3.5–5.2)
BILIRUBIN TOTAL: 0.5 mg/dL (ref 0.3–1.2)
BUN: 5 mg/dL — AB (ref 6–23)
CHLORIDE: 100 meq/L (ref 96–112)
CO2: 24 mEq/L (ref 19–32)
Calcium: 8.9 mg/dL (ref 8.4–10.5)
Creatinine, Ser: 0.47 mg/dL — ABNORMAL LOW (ref 0.50–1.10)
GFR calc non Af Amer: >90 mL/min (ref >90)
GLUCOSE: 109 mg/dL — AB (ref 70–99)
POTASSIUM: 3.9 meq/L (ref 3.7–5.3)
Sodium: 141 mEq/L (ref 137–147)
Total Protein: 7.6 g/dL (ref 6.0–8.3)

## 2014-04-15 LAB — CBC
HCT: 33 % — ABNORMAL LOW (ref 36.0–46.0)
HEMOGLOBIN: 10.8 g/dL — AB (ref 12.0–15.0)
MCH: 30 pg (ref 26.0–34.0)
MCHC: 32.7 g/dL (ref 30.0–36.0)
MCV: 91.7 fL (ref 78.0–100.0)
Platelets: 250 10*3/uL (ref 150–400)
RBC: 3.6 MIL/uL — ABNORMAL LOW (ref 3.87–5.11)
RDW: 18 % — ABNORMAL HIGH (ref 11.5–15.5)
WBC: 2.7 10*3/uL — ABNORMAL LOW (ref 4.0–10.5)

## 2014-04-15 LAB — I-STAT TROPONIN, ED: Troponin i, poc: 0 ng/mL (ref 0.00–0.08)

## 2014-04-15 LAB — URINE MICROSCOPIC-ADD ON

## 2014-04-15 LAB — URINALYSIS, ROUTINE W REFLEX MICROSCOPIC
Bilirubin Urine: NEGATIVE
GLUCOSE, UA: NEGATIVE mg/dL
Ketones, ur: NEGATIVE mg/dL
LEUKOCYTES UA: NEGATIVE
NITRITE: NEGATIVE
PROTEIN: NEGATIVE mg/dL
Specific Gravity, Urine: 1.012 (ref 1.005–1.030)
Urobilinogen, UA: 0.2 mg/dL (ref 0.0–1.0)
pH: 6 (ref 5.0–8.0)

## 2014-04-15 LAB — PROTIME-INR
INR: 1.07 (ref 0.00–1.49)
Prothrombin Time: 13.9 seconds (ref 11.6–15.2)

## 2014-04-15 LAB — RAPID URINE DRUG SCREEN, HOSP PERFORMED
AMPHETAMINES: NOT DETECTED
Barbiturates: NOT DETECTED
Benzodiazepines: NOT DETECTED
Cocaine: NOT DETECTED
Opiates: NOT DETECTED
Tetrahydrocannabinol: NOT DETECTED

## 2014-04-15 LAB — ETHANOL: Alcohol, Ethyl (B): 327 mg/dL — ABNORMAL HIGH (ref 0–11)

## 2014-04-15 LAB — APTT: APTT: 29 s (ref 24–37)

## 2014-04-15 MED ORDER — HYDROCODONE-ACETAMINOPHEN 5-325 MG PO TABS
2.0000 | ORAL_TABLET | Freq: Four times a day (QID) | ORAL | Status: DC | PRN
  Administered 2014-04-15 – 2014-04-16 (×3): 2 via ORAL
  Filled 2014-04-15 (×3): qty 2

## 2014-04-15 MED ORDER — SODIUM CHLORIDE 0.9 % IV SOLN
INTRAVENOUS | Status: DC
Start: 2014-04-15 — End: 2014-04-15
  Administered 2014-04-15: 23:00:00 via INTRAVENOUS

## 2014-04-15 MED ORDER — LORAZEPAM 2 MG/ML IJ SOLN
1.0000 mg | Freq: Once | INTRAMUSCULAR | Status: AC
  Administered 2014-04-15: 1 mg via INTRAVENOUS
  Filled 2014-04-15: qty 1

## 2014-04-15 MED ORDER — FLUOXETINE HCL 20 MG PO CAPS
40.0000 mg | ORAL_CAPSULE | Freq: Every day | ORAL | Status: DC
  Administered 2014-04-16 – 2014-04-17 (×2): 40 mg via ORAL
  Filled 2014-04-15 (×2): qty 2

## 2014-04-15 MED ORDER — CARVEDILOL 3.125 MG PO TABS
1.5600 mg | ORAL_TABLET | Freq: Two times a day (BID) | ORAL | Status: DC
  Administered 2014-04-16 – 2014-04-17 (×3): 1.56 mg via ORAL
  Filled 2014-04-15 (×6): qty 0.5

## 2014-04-15 MED ORDER — LEVETIRACETAM 750 MG PO TABS
1500.0000 mg | ORAL_TABLET | Freq: Two times a day (BID) | ORAL | Status: DC
  Administered 2014-04-16 – 2014-04-17 (×3): 1500 mg via ORAL
  Filled 2014-04-15 (×4): qty 2

## 2014-04-15 MED ORDER — ASPIRIN 325 MG PO TABS
325.0000 mg | ORAL_TABLET | Freq: Every day | ORAL | Status: DC
  Administered 2014-04-16 – 2014-04-17 (×2): 325 mg via ORAL
  Filled 2014-04-15 (×2): qty 1

## 2014-04-15 MED ORDER — LEVETIRACETAM IN NACL 500 MG/100ML IV SOLN
500.0000 mg | Freq: Once | INTRAVENOUS | Status: AC
  Administered 2014-04-15: 500 mg via INTRAVENOUS
  Filled 2014-04-15: qty 100

## 2014-04-15 MED ORDER — SODIUM CHLORIDE 0.9 % IV SOLN: INTRAVENOUS | Status: DC

## 2014-04-15 MED ORDER — ENOXAPARIN SODIUM 40 MG/0.4ML ~~LOC~~ SOLN
40.0000 mg | Freq: Every day | SUBCUTANEOUS | Status: DC
  Filled 2014-04-15 (×3): qty 0.4

## 2014-04-15 MED ORDER — TRAZODONE HCL 100 MG PO TABS: 100.0000 mg | ORAL_TABLET | Freq: Every day | ORAL | Status: DC

## 2014-04-15 NOTE — ED Notes (Signed)
Report attempted to [facility] 3W  Bed 17

## 2014-04-15 NOTE — ED Provider Notes (Signed)
CSN: [id number]     Arrival date & time 04/15/14  1858 History   First MD Initiated Contact with Patient 04/15/14 1916     Chief Complaint  Patient presents with  . knot on head   . Dizziness     (Consider location/radiation/quality/duration/timing/severity/associated sxs/prior Treatment) HPI Comments: Patient complains of painful nodule to the back of her head she noticed around 11 AM. Since then she's had progressively worsening pain with headache coming and going. Denies thunderclap onset. Denies fever. She endorses pain going down the right side of her neck. She's had a intermittent right-sided weakness or which she has had intermittently since her cardiac arrest earlier this year. She denies any falls or hitting her head. She said she did have a seizure yesterday. States that the patient.  she admits to drinking alcohol today. denies any neck pain, abdominal pain. She has chronic pain in her right clavicle and ribs from previous fall and clavicle fracture. Denies any fevers   The history is provided by the patient.    Past Medical History  Diagnosis Date  . Proctitis   . Cysts of both ovaries   . Seizures   . Anemia   . Anxiety   . Blood transfusion without reported diagnosis   . Depression   . Fatty liver 10/05/13  . Cardiac arrest    Past Surgical History  Procedure Laterality Date  . Ovarian cyst removal    . Laparoscopy N/A 09/28/2013    Procedure: LAPAROSCOPY OPERATIVE;  Surgeon: Terrance Mass, MD;  Location: [facility];  Service: Gynecology;  Laterality: N/A;  . Laparoscopic appendectomy Right 09/28/2013    Procedure: APPENDECTOMY LAPAROSCOPIC;  Surgeon: Terrance Mass, MD;  Location: [facility];  Service: Gynecology;  Laterality: Right;  . Salpingoophorectomy Right 09/28/2013    Procedure: SALPINGO OOPHORECTOMY;  Surgeon: Terrance Mass, MD;  Location: [facility];  Service: Gynecology;  Laterality: Right;  . Colonoscopy N/A 09/30/2013    Procedure: COLONOSCOPY;  Surgeon: Lafayette Dragon, MD;  Location: [facility];  Service: Endoscopy;  Laterality: N/A;  . Esophagogastroduodenoscopy N/A 11/23/2013    Procedure: ESOPHAGOGASTRODUODENOSCOPY (EGD);  Surgeon: Jerene Bears, MD;  Location: [facility];  Service: Endoscopy;  Laterality: N/A;  . Appendectomy     Family History  Problem Relation Age of Onset  . Diabetes Mother   . Hyperlipidemia Mother   . Stroke Mother   . Diabetes Father    History  Substance Use Topics  . Smoking status: Never Smoker   . Smokeless tobacco: Never Used  . Alcohol Use: Yes   OB History   Grav Para Term Preterm Abortions TAB SAB Ect Mult Living   7 3   4  4   3      Review of Systems  Constitutional: Negative for fever, activity change and appetite change.  HENT: Negative for congestion and rhinorrhea.   Eyes: Negative for visual disturbance.  Respiratory: Negative for cough and chest tightness.   Gastrointestinal: Negative for nausea, vomiting and abdominal pain.  Genitourinary: Negative for vaginal bleeding.  Musculoskeletal: Positive for back pain and neck pain. Negative for arthralgias, gait problem and myalgias.  Skin: Negative for rash.  Neurological: Positive for dizziness, seizures and headaches. Negative for weakness.  A complete 10 system review of systems was obtained and all systems are negative except as noted in the HPI and PMH.      Allergies  Morphine and related; Tramadol; and Penicillins  Home Medications   Prior  to Admission medications   Medication Sig Start Date End Date Taking? Authorizing Provider  acetaminophen (TYLENOL) 500 MG tablet Take 500 mg by mouth every 6 (six) hours as needed for moderate pain.   Yes Historical Provider, MD  carvedilol (COREG) 3.125 MG tablet Take 1.56 mg by mouth 2 (two) times daily. Takes half a tablet =1.56mg   in the morning and half a tablet =1.56mg  at night.   Yes Historical Provider, MD  FLUoxetine (PROZAC) 20 MG capsule Take 40 mg by mouth daily.    Yes Historical  Provider, MD  HYDROcodone-acetaminophen (NORCO/VICODIN) 5-325 MG per tablet Take 2 tablets by mouth every 6 (six) hours as needed for moderate pain.   Yes Historical Provider, MD  levETIRAcetam (KEPPRA) 1000 MG tablet Take 1,500 mg by mouth 2 (two) times daily.   Yes Historical Provider, MD  magnesium oxide (MAG-OX) 400 MG tablet Take 400 mg by mouth 3 (three) times daily.   Yes Historical Provider, MD  Menthol (HALLS COUGH DROPS MT) Use as directed 1 lozenge in the mouth or throat daily as needed (for cough).   Yes Historical Provider, MD  Menthol, Topical Analgesic, (ICY HOT EX) Apply 1 application topically daily.   Yes Historical Provider, MD  naproxen sodium (ANAPROX) 220 MG tablet Take 220 mg by mouth daily as needed (for pain).   Yes Historical Provider, MD  traZODone (DESYREL) 100 MG tablet Take 100 mg by mouth at bedtime.   Yes Historical Provider, MD   BP 112/75  Pulse 80  Temp(Src) 97.9 F (36.6 C) (Oral)  Resp 18  SpO2 97%  LMP 03/21/2014 Physical Exam  Nursing note and vitals reviewed. Constitutional: She is oriented to person, place, and time. She appears well-developed and well-nourished. No distress.  Smells of alcohol  HENT:  Head: Normocephalic and atraumatic.  Mouth/Throat: Oropharynx is clear and moist. No oropharyngeal exudate.  Painful nodule to occiput. No erythema or fluctuance  Eyes: Conjunctivae and EOM are normal. Pupils are equal, round, and reactive to light.  Neck: Normal range of motion. Neck supple.  No meningismus. Right paraspinal C-spine pain, no midline pain  Cardiovascular: Normal rate, regular rhythm, normal heart sounds and intact distal pulses.   No murmur heard. Pulmonary/Chest: Effort normal and breath sounds normal. No respiratory distress.  Abdominal: Soft. There is no tenderness. There is no rebound and no guarding.  Musculoskeletal: Normal range of motion. She exhibits no edema and no tenderness.  Neurological: She is alert and oriented  to person, place, and time. No cranial nerve deficit. She exhibits normal muscle tone. Coordination normal.  Cranial nerves 2-12 intact, 3/5 strength of right upper extremity and right leg. 5/5 strength of the left arm and left leg. No ataxia. No nystagmus. Negative Romberg. Normal gait. Questionable effort on the right side. Decreased sensation on R side.  Skin: Skin is warm.  Psychiatric: She has a normal mood and affect. Her behavior is normal.    ED Course  Procedures (including critical care time) Labs Review Labs Reviewed  ETHANOL - Abnormal; Notable for the following:    Alcohol, Ethyl (B) 327 (*)    All other components within normal limits  CBC - Abnormal; Notable for the following:    WBC 2.7 (*)    RBC 3.60 (*)    Hemoglobin 10.8 (*)    HCT 33.0 (*)    RDW 18.0 (*)    All other components within normal limits  DIFFERENTIAL - Abnormal; Notable for the following:  Neutrophils Relative % 42 (*)    Neutro Abs 1.2 (*)    Lymphocytes Relative 47 (*)    Basophils Relative 2 (*)    All other components within normal limits  COMPREHENSIVE METABOLIC PANEL - Abnormal; Notable for the following:    Glucose, Bld 109 (*)    BUN 5 (*)    Creatinine, Ser 0.47 (*)    AST 57 (*)    Alkaline Phosphatase 158 (*)    Anion gap 17 (*)    All other components within normal limits  URINALYSIS, ROUTINE W REFLEX MICROSCOPIC - Abnormal; Notable for the following:    Hgb urine dipstick MODERATE (*)    All other components within normal limits  URINE MICROSCOPIC-ADD ON - Abnormal; Notable for the following:    Squamous Epithelial / LPF FEW (*)    All other components within normal limits  I-STAT CHEM 8, ED - Abnormal; Notable for the following:    BUN <3 (*)    Glucose, Bld 110 (*)    Calcium, Ion 1.02 (*)    All other components within normal limits  PROTIME-INR  APTT  URINE RAPID DRUG SCREEN (HOSP PERFORMED)  LIPID PANEL  I-STAT TROPOININ, ED  I-STAT TROPOININ, ED    Imaging  Review Ct Head Wo Contrast  04/15/2014   CLINICAL DATA:  Right head pain, blurred vision, red swollen area to posterior head, shooting pain down neck  EXAM: CT HEAD WITHOUT CONTRAST  CT CERVICAL SPINE WITHOUT CONTRAST  TECHNIQUE: Multidetector CT imaging of the head and cervical spine was performed following the standard protocol without intravenous contrast. Multiplanar CT image reconstructions of the cervical spine were also generated.  COMPARISON:  CT head dated 04/04/2014  FINDINGS: CT HEAD FINDINGS  No evidence of parenchymal hemorrhage or extra-axial fluid collection. No mass lesion, mass effect, or midline shift.  No CT evidence of acute infarction.  Mild chronic hypodensity in the subinsular region bilaterally.  Cerebral volume is within normal limits.  No ventriculomegaly.  The visualized paranasal sinuses are essentially clear. The mastoid air cells are unopacified.  No evidence of calvarial fracture.  CT CERVICAL SPINE FINDINGS  Reversal the normal cervical lordosis.  No evidence of fracture or dislocation. Vertebral body heights and intervertebral disc spaces are maintained. Dens appears intact.  No prevertebral soft tissue swelling.  Visualized thyroid is unremarkable.  Visualized lung apices are notable for mild biapical pleural parenchymal scarring.  IMPRESSION: No evidence of acute intracranial abnormality.  No evidence of traumatic injury to the cervical spine.   Electronically Signed   By: Julian Hy M.D.   On: 04/15/2014 20:27   Ct Cervical Spine Wo Contrast  04/15/2014   CLINICAL DATA:  Right head pain, blurred vision, red swollen area to posterior head, shooting pain down neck  EXAM: CT HEAD WITHOUT CONTRAST  CT CERVICAL SPINE WITHOUT CONTRAST  TECHNIQUE: Multidetector CT imaging of the head and cervical spine was performed following the standard protocol without intravenous contrast. Multiplanar CT image reconstructions of the cervical spine were also generated.  COMPARISON:  CT  head dated 04/04/2014  FINDINGS: CT HEAD FINDINGS  No evidence of parenchymal hemorrhage or extra-axial fluid collection. No mass lesion, mass effect, or midline shift.  No CT evidence of acute infarction.  Mild chronic hypodensity in the subinsular region bilaterally.  Cerebral volume is within normal limits.  No ventriculomegaly.  The visualized paranasal sinuses are essentially clear. The mastoid air cells are unopacified.  No evidence of calvarial fracture.  CT CERVICAL SPINE FINDINGS  Reversal the normal cervical lordosis.  No evidence of fracture or dislocation. Vertebral body heights and intervertebral disc spaces are maintained. Dens appears intact.  No prevertebral soft tissue swelling.  Visualized thyroid is unremarkable.  Visualized lung apices are notable for mild biapical pleural parenchymal scarring.  IMPRESSION: No evidence of acute intracranial abnormality.  No evidence of traumatic injury to the cervical spine.   Electronically Signed   By: Julian Hy M.D.   On: 04/15/2014 20:27     EKG Interpretation   Date/Time:  Sunday April 15 2014 19:53:32 EDT Ventricular Rate:  87 PR Interval:  139 QRS Duration: 92 QT Interval:  419 QTC Calculation: 504 R Axis:   74 Text Interpretation:  Sinus rhythm Borderline prolonged QT interval  Baseline wander in lead(s) I III aVL No significant change was found \\E \  Confirmed by Wyvonnia Dusky  MD, Angelena Sand [phone]) on 04/15/2014 8:04:31 PM      MDM   Final diagnoses:  Right sided weakness  Alcohol intoxication, uncomplicated  Head contusion, initial encounter   patient with complex issues including seizures versus pseudoseizures, alcohol abuse, previous cardiac arrest presenting with painful nodule to scalp. Denies fall or trauma. States she did have a seizure yesterday. Has had intermittent right-sided weakness throughout the day today. Denies fever. Code stroke not activated as patient's last seen normal was 11 am.  CT head and C-spine  negative. Alcohol intoxication noted.  Right-sided weakness persists. Patient states last seizure was yesterday. Doubt Alcohol withdrawal given her level of intoxication currently.  Case d.w Dr, Armida Sans.  He does not feel MRI is necessary if patient's weakness comes back to baseline. Question Todd's paralysis from seizure.  Patient now having myoclonic jerks of right leg which she says she cannot control. She is given IV Ativan and IV keppra.  Right-sided weakness persists. She is not back to baseline. She will need further evaluation by neurology and wait until her right-sided weakness improves. She likely has some element of functional weakness but cannot rule out Todd's paralysis. Discussed with Dr. Posey Pronto who will arrange admission to [facility].  Ezequiel Essex, MD 04/16/14 [phone]

## 2014-04-15 NOTE — ED Notes (Signed)
2nd attempt to cone for report , pt room changed to 4, Christina Rn to call

## 2014-04-15 NOTE — ED Notes (Signed)
Pt rubbed head today and felt knot on back of head around 11am this morning.. Pt c/o of pain only on right side of head. Pt also burred vision. Pt has shooting pains down neck.

## 2014-04-16 ENCOUNTER — Inpatient Hospital Stay (HOSPITAL_COMMUNITY): Payer: Self-pay

## 2014-04-16 DIAGNOSIS — M6281 Muscle weakness (generalized): Secondary | ICD-10-CM

## 2014-04-16 DIAGNOSIS — IMO0002 Reserved for concepts with insufficient information to code with codable children: Secondary | ICD-10-CM

## 2014-04-16 MED ORDER — TRAZODONE HCL 100 MG PO TABS
100.0000 mg | ORAL_TABLET | Freq: Every day | ORAL | Status: DC
  Administered 2014-04-16: 100 mg via ORAL
  Filled 2014-04-16 (×3): qty 1

## 2014-04-16 MED ORDER — ADULT MULTIVITAMIN W/MINERALS CH
1.0000 | ORAL_TABLET | Freq: Every day | ORAL | Status: DC
  Filled 2014-04-16 (×2): qty 1

## 2014-04-16 MED ORDER — LORAZEPAM 2 MG/ML IJ SOLN
1.0000 mg | Freq: Four times a day (QID) | INTRAMUSCULAR | Status: DC | PRN
  Filled 2014-04-16: qty 1

## 2014-04-16 MED ORDER — FOLIC ACID 1 MG PO TABS
1.0000 mg | ORAL_TABLET | Freq: Every day | ORAL | Status: DC
  Filled 2014-04-16 (×2): qty 1

## 2014-04-16 MED ORDER — LORAZEPAM 2 MG/ML IJ SOLN
0.0000 mg | Freq: Four times a day (QID) | INTRAMUSCULAR | Status: DC
  Administered 2014-04-16 (×3): 1 mg via INTRAVENOUS
  Filled 2014-04-16 (×2): qty 1

## 2014-04-16 MED ORDER — ONDANSETRON HCL 4 MG/2ML IJ SOLN
4.0000 mg | Freq: Four times a day (QID) | INTRAMUSCULAR | Status: DC | PRN
  Administered 2014-04-16 (×2): 4 mg via INTRAVENOUS
  Filled 2014-04-16 (×2): qty 2

## 2014-04-16 MED ORDER — HYDROCODONE-ACETAMINOPHEN 5-325 MG PO TABS
2.0000 | ORAL_TABLET | ORAL | Status: AC | PRN
  Administered 2014-04-16 – 2014-04-17 (×2): 2 via ORAL
  Filled 2014-04-16 (×2): qty 2

## 2014-04-16 MED ORDER — VITAMIN B-1 100 MG PO TABS
100.0000 mg | ORAL_TABLET | Freq: Every day | ORAL | Status: DC
  Filled 2014-04-16 (×2): qty 1

## 2014-04-16 MED ORDER — LORAZEPAM 2 MG/ML IJ SOLN
1.0000 mg | INTRAMUSCULAR | Status: DC | PRN
  Filled 2014-04-16: qty 1

## 2014-04-16 MED ORDER — LORAZEPAM 1 MG PO TABS
1.0000 mg | ORAL_TABLET | Freq: Four times a day (QID) | ORAL | Status: DC | PRN
Start: 2014-04-16 — End: 2014-04-17
  Administered 2014-04-17 (×2): 1 mg via ORAL
  Filled 2014-04-16 (×2): qty 1

## 2014-04-16 MED ORDER — THIAMINE HCL 100 MG/ML IJ SOLN: 100.0000 mg | Freq: Every day | INTRAMUSCULAR | Status: DC

## 2014-04-16 MED ORDER — LORAZEPAM 2 MG/ML IJ SOLN: 0.0000 mg | Freq: Two times a day (BID) | INTRAMUSCULAR | Status: DC

## 2014-04-16 NOTE — Progress Notes (Signed)
EEG completed, results pending. 

## 2014-04-16 NOTE — Evaluation (Signed)
Physical Therapy Evaluation Patient Details Name: Kristen Roberson MRN: [id number] DOB: 1976/05/10 Today's Date: 04/16/2014   History of Present Illness  Kristen Roberson is a 38 y.o. female with Past medical history of seizure, pseudoseizure, alcohol dependence, V. fib cardiac arrest in the past.patient presents with complaints of right-sided weakness and tremors of the right hand and leg. She mentions she had a seizure episode 9/26 which was tonic-clonic generalized and it was not witnessed. 9/27 when she woke up she had felt a bump on the back of the head which is painful. Later on she started having right-sided pain on the head as well as eye pain on the right side. Pt with cardiac arrest 7/28   Clinical Impression  Pt very pleasant with generalized weakness with inconsistent increased weakness on right side. Pt states she also has a right broken clavicle healing non-operatively. Pt with maintained RLE shaking in sitting and standing inconsistent, observed with walking in hall with P.T. not observed walking independently out of bathroom or during long arc quads. Pt with noted lump on the back of her head. Will follow acutely for therapy to maximize balance, gait and strength to decrease fall risk and increase independence with recommendation for OPPT if pt not back to independent level at D/C. Pt and boyfriend educated for HEP and increased activity.     Follow Up Recommendations Outpatient PT    Equipment Recommendations  None recommended by PT    Recommendations for Other Services       Precautions / Restrictions Precautions Precautions: Fall Precaution Comments: intermittent RLE shaking      Mobility  Bed Mobility Overal bed mobility: Modified Independent                Transfers Overall transfer level: Modified independent                  Ambulation/Gait Ambulation/Gait assistance: Supervision Ambulation Distance (Feet): 225 Feet Assistive device: 1 person hand held  assist Gait Pattern/deviations: Step-through pattern;Decreased stride length   Gait velocity interpretation: Below normal speed for age/gender General Gait Details: pt with inconsistent RLE shaking in standing and non-weight bearing with HHA for steadiness with pt deferring stairs at this time  Stairs            Wheelchair Mobility    Modified Rankin (Stroke Patients Only) Modified Rankin (Stroke Patients Only) Pre-Morbid Rankin Score: No symptoms Modified Rankin: Slight disability     Balance Overall balance assessment: Needs assistance   Sitting balance-Leahy Scale: Normal       Standing balance-Leahy Scale: Fair                               Pertinent Vitals/Pain Pain Assessment: 0-10 Pain Score: 7  Pain Location: posterior head Pain Descriptors / Indicators: Aching Pain Intervention(s): Repositioned;Patient requesting pain meds-RN notified HR 93    Home Living Family/patient expects to be discharged to:: Private residence Living Arrangements: Spouse/significant other Available Help at Discharge: Family;Available 24 hours/day Type of Home: Apartment Home Access: Stairs to enter Entrance Stairs-Rails: Psychiatric nurse of Steps: 15 Home Layout: One level Home Equipment: None Additional Comments: boyfriend available at all times    Prior Function Level of Independence: Independent         Comments: Pt reports she has been depressed and not moving much. Going through divorce after 34yrs. States she was running long distance prior to grossly 6 mo  ago     Hand Dominance   Dominant Hand: Right    Extremity/Trunk Assessment   Upper Extremity Assessment: Generalized weakness           Lower Extremity Assessment: Generalized weakness;RLE deficits/detail;LLE deficits/detail RLE Deficits / Details: hip flexion 3/5, knee extension 3/5, knee flexion 4/5, dorsiflexion 4/5 LLE Deficits / Details: hip flexion 3/5, knee  extension 4/5, knee flexion 4/5, dorsiflexion 4/5. pt reports full and equal sensation bil  Cervical / Trunk Assessment: Normal  Communication      Cognition Arousal/Alertness: Awake/alert Behavior During Therapy: WFL for tasks assessed/performed Overall Cognitive Status: Within Functional Limits for tasks assessed                      General Comments      Exercises General Exercises - Lower Extremity Long Arc Quad: AROM;Seated;Both;15 reps Hip Flexion/Marching: AROM;Seated;Both;15 reps Toe Raises: AROM;Seated;Both;15 reps Heel Raises: AROM;Seated;Both;15 reps      Assessment/Plan    PT Assessment Patient needs continued PT services  PT Diagnosis Abnormality of gait;Generalized weakness   PT Problem List Decreased strength;Decreased activity tolerance;Decreased balance;Pain  PT Treatment Interventions Gait training;Balance training;Stair training;Patient/family education;Therapeutic exercise;Therapeutic activities   PT Goals (Current goals can be found in the Care Plan section) Acute Rehab PT Goals Patient Stated Goal: return to running PT Goal Formulation: With patient Time For Goal Achievement: 04/23/14 Potential to Achieve Goals: Good    Frequency Min 3X/week   Barriers to discharge        Co-evaluation               End of Session Equipment Utilized During Treatment: Gait belt Activity Tolerance: Patient tolerated treatment well Patient left: in chair;with call bell/phone within reach;with chair alarm set;with family/visitor present Nurse Communication: Mobility status    Functional Assessment Tool Used: clinical judgement Functional Limitation: Mobility: Walking and moving around Mobility: Walking and Moving Around Current Status [phone]): At least 1 percent but less than 20 percent impaired, limited or restricted Mobility: Walking and Moving Around Goal Status [phone]): 0 percent impaired, limited or restricted    Time: 0738-0800 PT Time  Calculation (min): 22 min   Charges:   PT Evaluation $Initial PT Evaluation Tier I: 1 Procedure PT Treatments $Therapeutic Exercise: 8-22 mins   PT G Codes:   Functional Assessment Tool Used: clinical judgement Functional Limitation: Mobility: Walking and moving around    Melford Aase 04/16/2014, 8:08 AM Elwyn Reach, [place]

## 2014-04-16 NOTE — H&P (Addendum)
[facility] History and Physical  Patient: Kristen Roberson  AST:[id number]  DOB: 10-05-1975  DOS: the patient was seen and examined on 04/15/2014 PCP: Angelica Chessman, MD  Chief Complaint: Right-sided weakness  HPI: Kristen Roberson is a 38 y.o. female with Past medical history of seizure, pseudoseizure, alcohol dependence, V. fib cardiac arrest in the past.  patient presents with complaints of right-sided weakness and tremors of the right hand and leg. She mentions she had a seizure episode yesterday which was tonic-clonic generalized and it was not witnessed. Earlier this morning when she woke up she had felt a bump on the back of the head which is painful. Later on she started having right-sided pain on the head as well as eye pain on the right side. In the afternoon she noted that she has been having some slurred speech with diplopia. She also noted right-sided tremors which are occurring on and off associated with right-sided weakness and numbness which includes her face, right hand as well as right leg. She denies any fall or trauma or injury she claims she is compliant with all her medication. She continues to drink and has drank large 1-1/2 can of beer which significant other claims would be equivalent to 6-10 beers of regular size. Patient denies drinking any other beverage or drug abuse.   she complains of on and off chest pain but denies any other pain anywhere else.  The patient is coming from home. And at her baseline independent for most of her ADL.  Review of Systems: as mentioned in the history of present illness.  A Comprehensive review of the other systems is negative.  Past Medical History  Diagnosis Date  . Proctitis   . Cysts of both ovaries   . Seizures   . Anemia   . Anxiety   . Blood transfusion without reported diagnosis   . Depression   . Fatty liver 10/05/13  . Cardiac arrest    Past Surgical History  Procedure Laterality Date  . Ovarian cyst removal    .  Laparoscopy N/A 09/28/2013    Procedure: LAPAROSCOPY OPERATIVE;  Surgeon: Terrance Mass, MD;  Location: [facility];  Service: Gynecology;  Laterality: N/A;  . Laparoscopic appendectomy Right 09/28/2013    Procedure: APPENDECTOMY LAPAROSCOPIC;  Surgeon: Terrance Mass, MD;  Location: [facility];  Service: Gynecology;  Laterality: Right;  . Salpingoophorectomy Right 09/28/2013    Procedure: SALPINGO OOPHORECTOMY;  Surgeon: Terrance Mass, MD;  Location: [facility];  Service: Gynecology;  Laterality: Right;  . Colonoscopy N/A 09/30/2013    Procedure: COLONOSCOPY;  Surgeon: Lafayette Dragon, MD;  Location: [facility];  Service: Endoscopy;  Laterality: N/A;  . Esophagogastroduodenoscopy N/A 11/23/2013    Procedure: ESOPHAGOGASTRODUODENOSCOPY (EGD);  Surgeon: Jerene Bears, MD;  Location: [facility];  Service: Endoscopy;  Laterality: N/A;  . Appendectomy     Social History:  reports that she has never smoked. She has never used smokeless tobacco. She reports that she drinks alcohol. She reports that she does not use illicit drugs.  Allergies  Allergen Reactions  . Morphine And Related Anaphylaxis     Tolerated hydromorphone on 11/25/13.   . Tramadol Other (See Comments)    Seizures   . Penicillins Other (See Comments)    Unknown childhood reaction.    Family History  Problem Relation Age of Onset  . Diabetes Mother   . Hyperlipidemia Mother   . Stroke Mother   . Diabetes Father  Prior to Admission medications   Medication Sig Start Date End Date Taking? Authorizing Provider  acetaminophen (TYLENOL) 500 MG tablet Take 500 mg by mouth every 6 (six) hours as needed for moderate pain.   Yes Historical Provider, MD  carvedilol (COREG) 3.125 MG tablet Take 1.56 mg by mouth 2 (two) times daily. Takes half a tablet =1.56mg   in the morning and half a tablet =1.56mg  at night.   Yes Historical Provider, MD  FLUoxetine (PROZAC) 20 MG capsule Take 40 mg by mouth daily.    Yes Historical Provider, MD   HYDROcodone-acetaminophen (NORCO/VICODIN) 5-325 MG per tablet Take 2 tablets by mouth every 6 (six) hours as needed for moderate pain.   Yes Historical Provider, MD  levETIRAcetam (KEPPRA) 1000 MG tablet Take 1,500 mg by mouth 2 (two) times daily.   Yes Historical Provider, MD  magnesium oxide (MAG-OX) 400 MG tablet Take 400 mg by mouth 3 (three) times daily.   Yes Historical Provider, MD  Menthol (HALLS COUGH DROPS MT) Use as directed 1 lozenge in the mouth or throat daily as needed (for cough).   Yes Historical Provider, MD  Menthol, Topical Analgesic, (ICY HOT EX) Apply 1 application topically daily.   Yes Historical Provider, MD  naproxen sodium (ANAPROX) 220 MG tablet Take 220 mg by mouth daily as needed (for pain).   Yes Historical Provider, MD  traZODone (DESYREL) 100 MG tablet Take 100 mg by mouth at bedtime.   Yes Historical Provider, MD    Physical Exam: Filed Vitals:   04/15/14 1905 04/15/14 2235 04/15/14 2343 04/16/14 0018  BP: 129/86  112/75 131/85  Pulse: 88  80   Temp: 97.9 F (36.6 C) 97.9 F (36.6 C)    TempSrc: Oral     Resp: 18  18   SpO2: 100%  97%     General: Alert, Awake and Oriented to Time, Place and Person. Appear in mild distress Eyes: PERRL ENT: Oral Mucosa clear moist. Neck: no  JVD Cardiovascular: S1 and S2 Present,  no  Murmur, Peripheral Pulses Present Respiratory: Bilateral Air entry equal and Decreased, Clear to Auscultation,  noCrackles,  no  wheezes Abdomen: Bowel Sound Present , Soft and Non tender Skin:  no Rash Extremities: No  Pedal edema,  no calf tenderness Neurologic: patient has significant functional findings on examination. Mental status AAOx3, speech normal, attention normal,  Cranial Nerves PERRL, EOM normal and present, facial sensation to light touch present on the left, patient mentions absence of sensation on the right,  Motor strength 5 out of 5 on the left, 3 / 5 on the right Patient was not able to elevate both hands proximal  to elbow, No pronator drift identified on the right side.  Sensation present to light touch, on the left, absent on the right  reflexes present knee and biceps, babinski negative,  Cerebellar test normal finger nose finger. Patient had on and off tremor-like movements of her right leg  Labs on Admission:  CBC:  Recent Labs Lab 04/15/14 1926 04/15/14 1955  WBC 2.7*  --   NEUTROABS 1.2*  --   HGB 10.8* 12.9  HCT 33.0* 38.0  MCV 91.7  --   PLT 250  --     CMP     Component Value Date/Time   NA 141 04/15/2014 1955   K 4.1 04/15/2014 1955   CL 104 04/15/2014 1955   CO2 24 04/15/2014 1926   GLUCOSE 110* 04/15/2014 1955   BUN <3* 04/15/2014 1955  CREATININE 0.90 04/15/2014 1955   CALCIUM 8.9 04/15/2014 1926   PROT 7.6 04/15/2014 1926   ALBUMIN 4.6 04/15/2014 1926   AST 57* 04/15/2014 1926   ALT 23 04/15/2014 1926   ALKPHOS 158* 04/15/2014 1926   BILITOT 0.5 04/15/2014 1926   GFRNONAA >90 04/15/2014 1926   GFRAA >90 04/15/2014 1926    No results found for this basename: LIPASE, AMYLASE,  in the last 168 hours No results found for this basename: AMMONIA,  in the last 168 hours  No results found for this basename: CKTOTAL, CKMB, CKMBINDEX, TROPONINI,  in the last 168 hours BNP (last 3 results)  Recent Labs  11/28/13 2059 03/25/14 1650 04/03/14 2155  PROBNP 39.4 127.2* 12.8    Radiological Exams on Admission: Ct Head Wo Contrast  04/15/2014   CLINICAL DATA:  Right head pain, blurred vision, red swollen area to posterior head, shooting pain down neck  EXAM: CT HEAD WITHOUT CONTRAST  CT CERVICAL SPINE WITHOUT CONTRAST  TECHNIQUE: Multidetector CT imaging of the head and cervical spine was performed following the standard protocol without intravenous contrast. Multiplanar CT image reconstructions of the cervical spine were also generated.  COMPARISON:  CT head dated 04/04/2014  FINDINGS: CT HEAD FINDINGS  No evidence of parenchymal hemorrhage or extra-axial fluid collection. No mass  lesion, mass effect, or midline shift.  No CT evidence of acute infarction.  Mild chronic hypodensity in the subinsular region bilaterally.  Cerebral volume is within normal limits.  No ventriculomegaly.  The visualized paranasal sinuses are essentially clear. The mastoid air cells are unopacified.  No evidence of calvarial fracture.  CT CERVICAL SPINE FINDINGS  Reversal the normal cervical lordosis.  No evidence of fracture or dislocation. Vertebral body heights and intervertebral disc spaces are maintained. Dens appears intact.  No prevertebral soft tissue swelling.  Visualized thyroid is unremarkable.  Visualized lung apices are notable for mild biapical pleural parenchymal scarring.  IMPRESSION: No evidence of acute intracranial abnormality.  No evidence of traumatic injury to the cervical spine.   Electronically Signed   By: Julian Hy M.D.   On: 04/15/2014 20:27   Ct Cervical Spine Wo Contrast  04/15/2014   CLINICAL DATA:  Right head pain, blurred vision, red swollen area to posterior head, shooting pain down neck  EXAM: CT HEAD WITHOUT CONTRAST  CT CERVICAL SPINE WITHOUT CONTRAST  TECHNIQUE: Multidetector CT imaging of the head and cervical spine was performed following the standard protocol without intravenous contrast. Multiplanar CT image reconstructions of the cervical spine were also generated.  COMPARISON:  CT head dated 04/04/2014  FINDINGS: CT HEAD FINDINGS  No evidence of parenchymal hemorrhage or extra-axial fluid collection. No mass lesion, mass effect, or midline shift.  No CT evidence of acute infarction.  Mild chronic hypodensity in the subinsular region bilaterally.  Cerebral volume is within normal limits.  No ventriculomegaly.  The visualized paranasal sinuses are essentially clear. The mastoid air cells are unopacified.  No evidence of calvarial fracture.  CT CERVICAL SPINE FINDINGS  Reversal the normal cervical lordosis.  No evidence of fracture or dislocation. Vertebral body  heights and intervertebral disc spaces are maintained. Dens appears intact.  No prevertebral soft tissue swelling.  Visualized thyroid is unremarkable.  Visualized lung apices are notable for mild biapical pleural parenchymal scarring.  IMPRESSION: No evidence of acute intracranial abnormality.  No evidence of traumatic injury to the cervical spine.   Electronically Signed   By: Julian Hy M.D.   On: 04/15/2014 20:27  EKG: Independently reviewed. normal sinus rhythm, nonspecific ST and T waves changes. Assessment/Plan Principal Problem:   Right sided weakness Active Problems:   Alcohol dependence   Major depression   Moderate malnutrition   Ventricular fibrillation   Seizures   1. Right sided weakness  patient is presenting with complaints of right-sided weakness, slurred speech, blurred vision. She mentions this as different than her baseline. She mentions she has on and off right-sided weakness ever since her last cardiac arrest. Currently her CT scan and C-spine is negative, initial lab work reveals significantly elevated alcohol level and otherwise normal at without any acute abnormality. At present the patient will be admitted in the hospital for observation. Serial telemetry monitoring as well as neuro checks. Neurology has been consulted. Further workup depending on the neurological consultation. Aspirin 325. Continue patient's home antiseizure medications and using lorazepam as needed. Seizure prophylaxis if  2. history of cardiac arrest Continue with Coreg  3. history of depression Continue Prozac.  4. chronic pain. Norco as needed continue home dose. At present patient does not have any acute pain at the time of my evaluation.   Consults: Neurology  DVT Prophylaxis: subcutaneous Heparin Nutrition:  n.p.o. until stroke evaluation   regular diet after that  Code Status:  full Family Communication:  significant other  was present at bedside, opportunity was given  to ask question and all questions were answered satisfactorily at the time of interview. Disposition: Admitted to observation in telemetry unit.  Author: Berle Mull, MD [facility] Pager: [phone]  If 7PM-7AM, please contact night-coverage [email or web address] Password [email or web address]

## 2014-04-16 NOTE — Progress Notes (Signed)
[facility] PROGRESS NOTE  Kristen Roberson LOV:[id number] DOB: 04-29-1976 DOA: 04/15/2014 PCP: Angelica Chessman, MD  Assessment/Plan: #1 right-sided weakness/tremors/shaking movements of the right leg Unknown etiology. CT of the head which was obtained was negative. Patient was seen in consultation by neurology Will did not feel patient did have an active seizure. Was recommended patient   continue Keppra. MRI of the head is noted and is pending.  #2 history of alcohol abuse/dependence Place on the Ativan withdrawal protocol. Follow.  #3 history of seizures Stable. Continue Keppra.  #4 chronic pain Continue home pain regimen.  #5 history of cardiac arrest Stable. Continue Coreg.  #6 history of depression Continue Prozac.  Number full Code Status: Full Family Communication: Updated patient number from the bedside. Disposition Plan: Home when medically stable hopefully tomorrow.   Consultants:  None  Procedures:  CT head 04/15/2014, CT C-spine 04/15/2014    Antibiotics:  None  HPI/Subjective: Patient states she's feeling better.  Objective: Filed Vitals:   04/16/14 1000  BP:   Pulse: 96  Temp:   Resp:     Intake/Output Summary (Last 24 hours) at 04/16/14 1124 Last data filed at 04/16/14 0939  Gross per 24 hour  Intake      0 ml  Output      0 ml  Net      0 ml   Filed Weights   04/16/14 0117 04/16/14 0426  Weight: 57.471 kg (126 lb 11.2 oz) 57.4 kg (126 lb 8.7 oz)    Exam:   General:  NAD  Cardiovascular: RRR  Respiratory: CTAB  Abdomen: Soft, nontender, nondistended, positive bowel sounds.  Musculoskeletal: No clubbing cyanosis or edema.  Data Reviewed: Basic Metabolic Panel:  Recent Labs Lab 04/15/14 1926 04/15/14 1955  NA 141 141  K 3.9 4.1  CL 100 104  CO2 24  --   GLUCOSE 109* 110*  BUN 5* <3*  CREATININE 0.47* 0.90  CALCIUM 8.9  --    Liver Function Tests:  Recent Labs Lab 04/15/14 1926  AST 57*  ALT 23   ALKPHOS 158*  BILITOT 0.5  PROT 7.6  ALBUMIN 4.6   No results found for this basename: LIPASE, AMYLASE,  in the last 168 hours No results found for this basename: AMMONIA,  in the last 168 hours CBC:  Recent Labs Lab 04/15/14 1926 04/15/14 1955  WBC 2.7*  --   NEUTROABS 1.2*  --   HGB 10.8* 12.9  HCT 33.0* 38.0  MCV 91.7  --   PLT 250  --    Cardiac Enzymes: No results found for this basename: CKTOTAL, CKMB, CKMBINDEX, TROPONINI,  in the last 168 hours BNP (last 3 results)  Recent Labs  11/28/13 2059 03/25/14 1650 04/03/14 2155  PROBNP 39.4 127.2* 12.8   CBG: No results found for this basename: GLUCAP,  in the last 168 hours  No results found for this or any previous visit (from the past 240 hour(s)).   Studies: Ct Head Wo Contrast  04/15/2014   CLINICAL DATA:  Right head pain, blurred vision, red swollen area to posterior head, shooting pain down neck  EXAM: CT HEAD WITHOUT CONTRAST  CT CERVICAL SPINE WITHOUT CONTRAST  TECHNIQUE: Multidetector CT imaging of the head and cervical spine was performed following the standard protocol without intravenous contrast. Multiplanar CT image reconstructions of the cervical spine were also generated.  COMPARISON:  CT head dated 04/04/2014  FINDINGS: CT HEAD FINDINGS  No evidence of parenchymal hemorrhage or extra-axial  fluid collection. No mass lesion, mass effect, or midline shift.  No CT evidence of acute infarction.  Mild chronic hypodensity in the subinsular region bilaterally.  Cerebral volume is within normal limits.  No ventriculomegaly.  The visualized paranasal sinuses are essentially clear. The mastoid air cells are unopacified.  No evidence of calvarial fracture.  CT CERVICAL SPINE FINDINGS  Reversal the normal cervical lordosis.  No evidence of fracture or dislocation. Vertebral body heights and intervertebral disc spaces are maintained. Dens appears intact.  No prevertebral soft tissue swelling.  Visualized thyroid is  unremarkable.  Visualized lung apices are notable for mild biapical pleural parenchymal scarring.  IMPRESSION: No evidence of acute intracranial abnormality.  No evidence of traumatic injury to the cervical spine.   Electronically Signed   By: Julian Hy M.D.   On: 04/15/2014 20:27   Ct Cervical Spine Wo Contrast  04/15/2014   CLINICAL DATA:  Right head pain, blurred vision, red swollen area to posterior head, shooting pain down neck  EXAM: CT HEAD WITHOUT CONTRAST  CT CERVICAL SPINE WITHOUT CONTRAST  TECHNIQUE: Multidetector CT imaging of the head and cervical spine was performed following the standard protocol without intravenous contrast. Multiplanar CT image reconstructions of the cervical spine were also generated.  COMPARISON:  CT head dated 04/04/2014  FINDINGS: CT HEAD FINDINGS  No evidence of parenchymal hemorrhage or extra-axial fluid collection. No mass lesion, mass effect, or midline shift.  No CT evidence of acute infarction.  Mild chronic hypodensity in the subinsular region bilaterally.  Cerebral volume is within normal limits.  No ventriculomegaly.  The visualized paranasal sinuses are essentially clear. The mastoid air cells are unopacified.  No evidence of calvarial fracture.  CT CERVICAL SPINE FINDINGS  Reversal the normal cervical lordosis.  No evidence of fracture or dislocation. Vertebral body heights and intervertebral disc spaces are maintained. Dens appears intact.  No prevertebral soft tissue swelling.  Visualized thyroid is unremarkable.  Visualized lung apices are notable for mild biapical pleural parenchymal scarring.  IMPRESSION: No evidence of acute intracranial abnormality.  No evidence of traumatic injury to the cervical spine.   Electronically Signed   By: Julian Hy M.D.   On: 04/15/2014 20:27    Scheduled Meds: . aspirin  325 mg Oral Daily  . carvedilol  1.56 mg Oral BID WC  . enoxaparin (LOVENOX) injection  40 mg Subcutaneous QHS  . FLUoxetine  40 mg Oral  Daily  . folic acid  1 mg Oral Daily  . levETIRAcetam  1,500 mg Oral BID  . LORazepam  0-4 mg Intravenous Q6H   Followed by  . [START ON 04/18/2014] LORazepam  0-4 mg Intravenous Q12H  . multivitamin with minerals  1 tablet Oral Daily  . thiamine  100 mg Oral Daily  . traZODone  100 mg Oral QHS   Continuous Infusions:   Principal Problem:   Right sided weakness Active Problems:   Alcohol dependence   Major depression   Moderate malnutrition   Ventricular fibrillation   Seizures    Time spent: 35 mins    [facility] MD [facility] Pager [phone]. If 7PM-7AM, please contact night-coverage at [email or web address], password [facility] 04/16/2014, 11:24 AM  LOS: 1 day
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
## 2014-04-16 NOTE — Consult Note (Signed)
NEURO HOSPITALIST CONSULT NOTE    Reason for Consult: right hemiparesis, tremors/shaking movements right leg  HPI:                                                                                                                                          Kristen Roberson is an 38 y.o. female with a past medical history significant for depression, anxiety, alcohol dependence, mixed seizure disorder, V. fib cardiac arrest, transferred to [facility] for further evaluation and management of the above stated symptoms. She said that she has " off and on" weakness of the right side for quite some time, but had a seizure 9/26 and the next day woke up and felt a bump on the back oh the head which is painful. In addition, she noticed worsening right sided weakness and subsequently had slurred speech, double vision, as well as almost constant tremors/shakiness of the right leg. Complains of HA but denies vertigo, difficulty swallowing, face droopiness, or visual disturbances. She takes keppra but had had poor adherence to treatment in the past. CT brain at [facility] showed no acute intracranial abnormality. Did not get better at [facility] and thus was send to [facility].   Past Medical History  Diagnosis Date  . Proctitis   . Cysts of both ovaries   . Seizures   . Anemia   . Anxiety   . Blood transfusion without reported diagnosis   . Depression   . Fatty liver 10/05/13  . Cardiac arrest     Past Surgical History  Procedure Laterality Date  . Ovarian cyst removal    . Laparoscopy N/A 09/28/2013    Procedure: LAPAROSCOPY OPERATIVE;  Surgeon: Terrance Mass, MD;  Location: [facility];  Service: Gynecology;  Laterality: N/A;  . Laparoscopic appendectomy Right 09/28/2013    Procedure: APPENDECTOMY LAPAROSCOPIC;  Surgeon: Terrance Mass, MD;  Location: [facility];  Service: Gynecology;  Laterality: Right;  . Salpingoophorectomy Right 09/28/2013    Procedure: SALPINGO OOPHORECTOMY;  Surgeon: Terrance Mass, MD;   Location: [facility];  Service: Gynecology;  Laterality: Right;  . Colonoscopy N/A 09/30/2013    Procedure: COLONOSCOPY;  Surgeon: Lafayette Dragon, MD;  Location: [facility];  Service: Endoscopy;  Laterality: N/A;  . Esophagogastroduodenoscopy N/A 11/23/2013    Procedure: ESOPHAGOGASTRODUODENOSCOPY (EGD);  Surgeon: Jerene Bears, MD;  Location: [facility];  Service: Endoscopy;  Laterality: N/A;  . Appendectomy      Family History  Problem Relation Age of Onset  . Diabetes Mother   . Hyperlipidemia Mother   . Stroke Mother   . Diabetes Father     Social History:  reports that she has never smoked. She has never used smokeless tobacco. She reports that she drinks alcohol. She reports that she does not  use illicit drugs.  Allergies  Allergen Reactions  . Morphine And Related Anaphylaxis     Tolerated hydromorphone on 11/25/13.   . Tramadol Other (See Comments)    Seizures   . Penicillins Other (See Comments)    Unknown childhood reaction.    MEDICATIONS:                                                                                                                     I have reviewed the patient's current medications.   ROS:                                                                                                                                       History obtained from the patient and chart review.  General ROS: negative for - chills, fever, night sweats, weight gain or weight loss Psychological ROS: negative for - behavioral disorder, hallucinations, or memory difficulties Ophthalmic ROS: signioficant for - blurry vision, double vision, and eye pain  ENT ROS: negative for - epistaxis, nasal discharge, oral lesions, sore throat, tinnitus or vertigo Allergy and Immunology ROS: negative for - hives or itchy/watery eyes Hematological and Lymphatic ROS: negative for - bleeding problems, bruising or swollen lymph nodes Endocrine ROS: negative for - galactorrhea, hair pattern  changes, polydipsia/polyuria or temperature intolerance Respiratory ROS: negative for - cough, hemoptysis, shortness of breath or wheezing Cardiovascular ROS: negative for - chest pain, dyspnea on exertion, edema or irregular heartbeat Gastrointestinal ROS: negative for - abdominal pain, diarrhea, hematemesis, nausea/vomiting or stool incontinence Genito-Urinary ROS: negative for - dysuria, hematuria, incontinence or urinary frequency/urgency Musculoskeletal ROS: negative for - joint swelling Neurological ROS: as noted in HPI Dermatological ROS: negative for rash and skin lesion changes  Physical exam: pleasant female in no apparent distress. Blood pressure 148/90, pulse 96, temperature 97.7 F (36.5 C), temperature source Oral, resp. rate 18, height 5' 6"  (1.676 m), weight 57.471 kg (126 lb 11.2 oz), last menstrual period 03/21/2014, SpO2 100.00%. Head: normocephalic. Neck: supple, no bruits, no JVD. Cardiac: no murmurs. Lungs: clear. Abdomen: soft, no tender, no mass. Extremities: no edema. Neurologic Examination:  General: Mental Status: Alert, oriented, thought content appropriate.  Speech fluent without evidence of aphasia.  Able to follow 3 step commands without difficulty. Cranial Nerves: II: Discs flat bilaterally; Visual fields grossly normal, pupils equal, round, reactive to light and accommodation III,IV, VI: ptosis not present, extra-ocular motions intact bilaterally V,VII: smile symmetric, facial light touch sensation normal bilaterally VIII: hearing normal bilaterally IX,X: gag reflex present XI: bilateral shoulder shrug XII: midline tongue extension without atrophy or fasciculations Motor: Inconsistent motor exam, doesn't offer full effort, and overall right sided weakness seems to be functional. She exhibits almost constant shakiness with associated increased tone right  leg that subsides when she gets distracted. Tone and bulk:normal tone throughout; no atrophy noted Sensory: Pinprick and light touch intact throughout, bilaterally Deep Tendon Reflexes:  Right: Upper Extremity   Left: Upper extremity   biceps (C-5 to C-6) 2/4   biceps (C-5 to C-6) 2/4 tricep (C7) 2/4    triceps (C7) 2/4 Brachioradialis (C6) 2/4  Brachioradialis (C6) 2/4  Lower Extremity Lower Extremity  quadriceps (L-2 to L-4) 2/4   quadriceps (L-2 to L-4) 2/4 Achilles (S1) 2/4   Achilles (S1) 2/4  Plantars: Right: downgoing   Left: downgoing Cerebellar: normal finger-to-nose,  normal heel-to-shin test Gait:  No tested    No results found for this basename: cbc, bmp, coags, chol, tri, ldl, hga1c    Results for orders placed during the hospital encounter of 04/15/14 (from the past 48 hour(s))  ETHANOL     Status: Abnormal   Collection Time    04/15/14  7:26 PM      Result Value Ref Range   Alcohol, Ethyl (B) 327 (*) 0 - 11 mg/dL   Comment:            LOWEST DETECTABLE LIMIT FOR     SERUM ALCOHOL IS 11 mg/dL     FOR MEDICAL PURPOSES ONLY  PROTIME-INR     Status: None   Collection Time    04/15/14  7:26 PM      Result Value Ref Range   Prothrombin Time 13.9  11.6 - 15.2 seconds   INR 1.07  0.00 - 1.49  APTT     Status: None   Collection Time    04/15/14  7:26 PM      Result Value Ref Range   aPTT 29  24 - 37 seconds  CBC     Status: Abnormal   Collection Time    04/15/14  7:26 PM      Result Value Ref Range   WBC 2.7 (*) 4.0 - 10.5 K/uL   RBC 3.60 (*) 3.87 - 5.11 MIL/uL   Hemoglobin 10.8 (*) 12.0 - 15.0 g/dL   HCT 33.0 (*) 36.0 - 46.0 %   MCV 91.7  78.0 - 100.0 fL   MCH 30.0  26.0 - 34.0 pg   MCHC 32.7  30.0 - 36.0 g/dL   RDW 18.0 (*) 11.5 - 15.5 %   Platelets 250  150 - 400 K/uL  DIFFERENTIAL     Status: Abnormal   Collection Time    04/15/14  7:26 PM      Result Value Ref Range   Neutrophils Relative % 42 (*) 43 - 77 %   Neutro Abs 1.2 (*) 1.7 - 7.7  K/uL   Lymphocytes Relative 47 (*) 12 - 46 %   Lymphs Abs 1.3  0.7 - 4.0 K/uL   Monocytes Relative 9  3 - 12 %  Monocytes Absolute 0.3  0.1 - 1.0 K/uL   Eosinophils Relative 0  0 - 5 %   Eosinophils Absolute 0.0  0.0 - 0.7 K/uL   Basophils Relative 2 (*) 0 - 1 %   Basophils Absolute 0.1  0.0 - 0.1 K/uL  COMPREHENSIVE METABOLIC PANEL     Status: Abnormal   Collection Time    04/15/14  7:26 PM      Result Value Ref Range   Sodium 141  137 - 147 mEq/L   Potassium 3.9  3.7 - 5.3 mEq/L   Chloride 100  96 - 112 mEq/L   CO2 24  19 - 32 mEq/L   Glucose, Bld 109 (*) 70 - 99 mg/dL   BUN 5 (*) 6 - 23 mg/dL   Creatinine, Ser 0.47 (*) 0.50 - 1.10 mg/dL   Calcium 8.9  8.4 - 10.5 mg/dL   Total Protein 7.6  6.0 - 8.3 g/dL   Albumin 4.6  3.5 - 5.2 g/dL   AST 57 (*) 0 - 37 U/L   ALT 23  0 - 35 U/L   Alkaline Phosphatase 158 (*) 39 - 117 U/L   Total Bilirubin 0.5  0.3 - 1.2 mg/dL   GFR calc non Af Amer >90  >90 mL/min   GFR calc Af Amer >90  >90 mL/min   Comment: (NOTE)     The eGFR has been calculated using the CKD EPI equation.     This calculation has not been validated in all clinical situations.     eGFR's persistently <90 mL/min signify possible Chronic Kidney     Disease.   Anion gap 17 (*) 5 - 15  I-STAT TROPOININ, ED     Status: None   Collection Time    04/15/14  7:54 PM      Result Value Ref Range   Troponin i, poc 0.00  0.00 - 0.08 ng/mL   Comment 3            Comment: Due to the release kinetics of cTnI,     a negative result within the first hours     of the onset of symptoms does not rule out     myocardial infarction with certainty.     If myocardial infarction is still suspected,     repeat the test at appropriate intervals.  I-STAT CHEM 8, ED     Status: Abnormal   Collection Time    04/15/14  7:55 PM      Result Value Ref Range   Sodium 141  137 - 147 mEq/L   Potassium 4.1  3.7 - 5.3 mEq/L   Chloride 104  96 - 112 mEq/L   BUN <3 (*) 6 - 23 mg/dL   Creatinine,  Ser 0.90  0.50 - 1.10 mg/dL   Glucose, Bld 110 (*) 70 - 99 mg/dL   Calcium, Ion 1.02 (*) 1.12 - 1.23 mmol/L   TCO2 25  0 - 100 mmol/L   Hemoglobin 12.9  12.0 - 15.0 g/dL   HCT 38.0  36.0 - 46.0 %  URINE RAPID DRUG SCREEN (HOSP PERFORMED)     Status: None   Collection Time    04/15/14 10:21 PM      Result Value Ref Range   Opiates NONE DETECTED  NONE DETECTED   Cocaine NONE DETECTED  NONE DETECTED   Benzodiazepines NONE DETECTED  NONE DETECTED   Amphetamines NONE DETECTED  NONE DETECTED   Tetrahydrocannabinol NONE DETECTED  NONE  DETECTED   Barbiturates NONE DETECTED  NONE DETECTED   Comment:            DRUG SCREEN FOR MEDICAL PURPOSES     ONLY.  IF CONFIRMATION IS NEEDED     FOR ANY PURPOSE, NOTIFY LAB     WITHIN 5 DAYS.                LOWEST DETECTABLE LIMITS     FOR URINE DRUG SCREEN     Drug Class       Cutoff (ng/mL)     Amphetamine      1000     Barbiturate      200     Benzodiazepine   517     Tricyclics       001     Opiates          300     Cocaine          300     THC              50  URINALYSIS, ROUTINE W REFLEX MICROSCOPIC     Status: Abnormal   Collection Time    04/15/14 10:21 PM      Result Value Ref Range   Color, Urine YELLOW  YELLOW   APPearance CLEAR  CLEAR   Specific Gravity, Urine 1.012  1.005 - 1.030   pH 6.0  5.0 - 8.0   Glucose, UA NEGATIVE  NEGATIVE mg/dL   Hgb urine dipstick MODERATE (*) NEGATIVE   Bilirubin Urine NEGATIVE  NEGATIVE   Ketones, ur NEGATIVE  NEGATIVE mg/dL   Protein, ur NEGATIVE  NEGATIVE mg/dL   Urobilinogen, UA 0.2  0.0 - 1.0 mg/dL   Nitrite NEGATIVE  NEGATIVE   Leukocytes, UA NEGATIVE  NEGATIVE  URINE MICROSCOPIC-ADD ON     Status: Abnormal   Collection Time    04/15/14 10:21 PM      Result Value Ref Range   Squamous Epithelial / LPF FEW (*) RARE   WBC, UA 0-2  <3 WBC/hpf   RBC / HPF 3-6  <3 RBC/hpf   Bacteria, UA RARE  RARE    Ct Head Wo Contrast  04/15/2014   CLINICAL DATA:  Right head pain, blurred vision, red  swollen area to posterior head, shooting pain down neck  EXAM: CT HEAD WITHOUT CONTRAST  CT CERVICAL SPINE WITHOUT CONTRAST  TECHNIQUE: Multidetector CT imaging of the head and cervical spine was performed following the standard protocol without intravenous contrast. Multiplanar CT image reconstructions of the cervical spine were also generated.  COMPARISON:  CT head dated 04/04/2014  FINDINGS: CT HEAD FINDINGS  No evidence of parenchymal hemorrhage or extra-axial fluid collection. No mass lesion, mass effect, or midline shift.  No CT evidence of acute infarction.  Mild chronic hypodensity in the subinsular region bilaterally.  Cerebral volume is within normal limits.  No ventriculomegaly.  The visualized paranasal sinuses are essentially clear. The mastoid air cells are unopacified.  No evidence of calvarial fracture.  CT CERVICAL SPINE FINDINGS  Reversal the normal cervical lordosis.  No evidence of fracture or dislocation. Vertebral body heights and intervertebral disc spaces are maintained. Dens appears intact.  No prevertebral soft tissue swelling.  Visualized thyroid is unremarkable.  Visualized lung apices are notable for mild biapical pleural parenchymal scarring.  IMPRESSION: No evidence of acute intracranial abnormality.  No evidence of traumatic injury to the cervical spine.   Electronically Signed   By: Bertis Ruddy  [place] Pink M.D.   On: 04/15/2014 20:27   Ct Cervical Spine Wo Contrast  04/15/2014   CLINICAL DATA:  Right head pain, blurred vision, red swollen area to posterior head, shooting pain down neck  EXAM: CT HEAD WITHOUT CONTRAST  CT CERVICAL SPINE WITHOUT CONTRAST  TECHNIQUE: Multidetector CT imaging of the head and cervical spine was performed following the standard protocol without intravenous contrast. Multiplanar CT image reconstructions of the cervical spine were also generated.  COMPARISON:  CT head dated 04/04/2014  FINDINGS: CT HEAD FINDINGS  No evidence of parenchymal hemorrhage or  extra-axial fluid collection. No mass lesion, mass effect, or midline shift.  No CT evidence of acute infarction.  Mild chronic hypodensity in the subinsular region bilaterally.  Cerebral volume is within normal limits.  No ventriculomegaly.  The visualized paranasal sinuses are essentially clear. The mastoid air cells are unopacified.  No evidence of calvarial fracture.  CT CERVICAL SPINE FINDINGS  Reversal the normal cervical lordosis.  No evidence of fracture or dislocation. Vertebral body heights and intervertebral disc spaces are maintained. Dens appears intact.  No prevertebral soft tissue swelling.  Visualized thyroid is unremarkable.  Visualized lung apices are notable for mild biapical pleural parenchymal scarring.  IMPRESSION: No evidence of acute intracranial abnormality.  No evidence of traumatic injury to the cervical spine.   Electronically Signed   By: Julian Hy M.D.   On: 04/15/2014 20:27   Assessment/Plan: 38 y/o admitted due to right hemiparesis, tremors/shaking movements right leg.  Neuro-exam is quite inconsistent/functional and the pattern of right LE shakiness is not consistent with focal motor seizures. Agree with MRI brain. Continue keppra. PT. Will follow up.  Dorian Pod, MD 04/16/2014, 1:32 AM

## 2014-04-16 NOTE — Procedures (Signed)
ELECTROENCEPHALOGRAM REPORT  Date of Study: 04/16/2014  Patient's Name: Kristen Roberson MRN: [id number] Date of Birth: January 19, 1976  Referring Provider: Dr. Berle Mull  Clinical History: This is a 39 year old woman with a history of seizure, pseudoseizure, R clavicle fracture 03/30/14, alcohol dependence, V. fib cardiac arrest in the past presenting with right-sided weakness and tremors of the right hand and leg.   Medications: aspirin tablet 325 mg  carvedilol (COREG) tablet 1.56 mg  enoxaparin (LOVENOX) injection 40 mg  FLUoxetine (PROZAC) capsule 40 mg  folic acid (FOLVITE) tablet 1 mg  HYDROcodone-acetaminophen (NORCO/VICODIN) 5-325 MG per tablet 2 tablet  levETIRAcetam (KEPPRA) tablet 1,500 mg  thiamine (VITAMIN B-1) tablet 100 mg  traZODone (DESYREL) tablet 100 mg   Technical Summary: A multichannel digital EEG recording measured by the international 10-20 system with electrodes applied with paste and impedances below 5000 ohms performed in our laboratory with EKG monitoring in an awake and asleep patient.  Hyperventilation was not performed. Photic stimulation was performed.  The digital EEG was referentially recorded, reformatted, and digitally filtered in a variety of bipolar and referential montages for optimal display.    Description: The patient is awake and asleep during the recording.  During maximal wakefulness, there is a symmetric, medium voltage 9 Hz posterior dominant rhythm that attenuates with eye opening.  The record is symmetric.  During drowsiness and sleep, there is an increase in theta slowing of the background.  Vertex waves and symmetric sleep spindles were seen.  Photic stimulation did not elicit any abnormalities.  There were no epileptiform discharges or electrographic seizures seen.    EKG lead was unremarkable.  Impression: This awake and asleep EEG is normal.    Clinical Correlation: A normal EEG does not exclude a clinical diagnosis of epilepsy.  Clinical correlation is advised.   Ellouise Newer, M.D.

## 2014-04-16 NOTE — ED Notes (Signed)
Pt states po medication for pain didn't work, and only dilaudid works IV

## 2014-04-16 NOTE — Progress Notes (Signed)
UR completed 

## 2014-04-17 DIAGNOSIS — F1020 Alcohol dependence, uncomplicated: Secondary | ICD-10-CM

## 2014-04-17 LAB — BASIC METABOLIC PANEL
ANION GAP: 13 (ref 5–15)
BUN: 7 mg/dL (ref 6–23)
CHLORIDE: 101 meq/L (ref 96–112)
CO2: 24 mEq/L (ref 19–32)
Calcium: 8.4 mg/dL (ref 8.4–10.5)
Creatinine, Ser: 0.44 mg/dL — ABNORMAL LOW (ref 0.50–1.10)
GFR calc Af Amer: >90 mL/min (ref >90)
GFR calc non Af Amer: >90 mL/min (ref >90)
Glucose, Bld: 101 mg/dL — ABNORMAL HIGH (ref 70–99)
Potassium: 3.7 mEq/L (ref 3.7–5.3)
Sodium: 138 mEq/L (ref 137–147)

## 2014-04-17 MED ORDER — HYDROCODONE-ACETAMINOPHEN 5-325 MG PO TABS
1.0000 | ORAL_TABLET | Freq: Four times a day (QID) | ORAL | Status: DC | PRN
  Filled 2014-04-17: qty 2

## 2014-04-17 MED ORDER — THIAMINE HCL 100 MG PO TABS: 100.0000 mg | ORAL_TABLET | Freq: Every day | ORAL | Status: DC

## 2014-04-17 MED ORDER — LORAZEPAM 1 MG PO TABS
1.0000 mg | ORAL_TABLET | Freq: Once | ORAL | Status: AC
  Administered 2014-04-17: 1 mg via ORAL
  Filled 2014-04-17: qty 1

## 2014-04-17 MED ORDER — ADULT MULTIVITAMIN W/MINERALS CH: 1.0000 | ORAL_TABLET | Freq: Every day | ORAL | Status: DC

## 2014-04-17 MED ORDER — HYDROCODONE-ACETAMINOPHEN 5-325 MG PO TABS
2.0000 | ORAL_TABLET | Freq: Once | ORAL | Status: AC
  Administered 2014-04-17: 2 via ORAL

## 2014-04-17 MED ORDER — HYDROCODONE-ACETAMINOPHEN 5-325 MG PO TABS
2.0000 | ORAL_TABLET | Freq: Four times a day (QID) | ORAL | Status: DC | PRN
  Administered 2014-04-17: 2 via ORAL
  Filled 2014-04-17: qty 2

## 2014-04-17 MED ORDER — FOLIC ACID 1 MG PO TABS: 1.0000 mg | ORAL_TABLET | Freq: Every day | ORAL | Status: DC
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
## 2014-04-17 NOTE — Care Management Note (Signed)
    Page 1 of 1   04/17/2014     3:14:09 PM CARE MANAGEMENT NOTE 04/17/2014  Patient:  Kristen Roberson, Kristen Roberson   Account Number:  [id number]  Date Initiated:  04/17/2014  Documentation initiated by:  Marvetta Gibbons  Subjective/Objective Assessment:   Pt admitted with dizzines, speech issues     Action/Plan:   PTA pt lived at home   Anticipated DC Date:  04/17/2014   Anticipated DC Plan:  [place]  CM consult  Follow-up appt scheduled  Outpatient Services - Pt will follow up      Choice offered to / List presented to:             Status of service:  Completed, signed off Medicare Important Message given?   (If response is "NO", the following Medicare IM given date fields will be blank) Date Medicare IM given:   Medicare IM given by:   Date Additional Medicare IM given:   Additional Medicare IM given by:    Discharge Disposition:  HOME/SELF CARE  Per UR Regulation:  Reviewed for med. necessity/level of care/duration of stay  If discussed at [place] of Stay Meetings, dates discussed:    Comments:  04/17/14- 1430- Marvetta Gibbons RN, BSN [phone] Referral for outpt PT- pt self pay (medicaid potential) and does not have a qualifying dx- MD wrote pt a prescription for outpt PT- gave prescription to pt and explained that she could f/u if she wanted to do outpt PT but that since she did not have a qualifying dx that the treatments would be an out of pocket expense- f/u appointment made at [facility] for this Thur. Oct. 1 at 12 noon- placed on d/c paperwork and info given to pt. -

## 2014-04-17 NOTE — Discharge Instructions (Signed)
Alcohol Intoxication °Alcohol intoxication occurs when you drink enough alcohol that it affects your ability to function. It can be mild or very severe. Drinking a lot of alcohol in a short time is called binge drinking. This can be very harmful. Drinking alcohol can also be more dangerous if you are taking medicines or other drugs. Some of the effects caused by alcohol may include: °· Loss of coordination. °· Changes in mood and behavior. °· Unclear thinking. °· Trouble talking (slurred speech). °· Throwing up (vomiting). °· Confusion. °· Slowed breathing. °· Twitching and shaking (seizures). °· Loss of consciousness. °HOME CARE °· Do not drive after drinking alcohol. °· Drink enough water and fluids to keep your pee (urine) clear or pale yellow. Avoid caffeine. °· Only take medicine as told by your doctor. °GET HELP IF: °· You throw up (vomit) many times. °· You do not feel better after a few days. °· You frequently have alcohol intoxication. Your doctor can help decide if you should see a substance use treatment counselor. °GET HELP RIGHT AWAY IF: °· You become shaky when you stop drinking. °· You have twitching and shaking. °· You throw up blood. It may look bright red or like coffee grounds. °· You notice blood in your poop (bowel movements). °· You become lightheaded or pass out (faint). °MAKE SURE YOU:  °· Understand these instructions. °· Will watch your condition. °· Will get help right away if you are not doing well or get worse. °Document Released: 12/23/2007 Document Revised: 03/08/2013 Document Reviewed: 12/09/2012 °[employer]® Patient Information ©2015 [employer]. This information is not intended to replace advice given to you by your health care provider. Make sure you discuss any questions you have with your health care provider. ° °

## 2014-04-17 NOTE — Progress Notes (Signed)
Subjective: Patient continues with intermittent shaking of the right lower extremity.  Features on examination suggest a functional etiology.    Objective: Current vital signs: BP 115/74  Pulse 77  Temp(Src) 98 F (36.7 C) (Oral)  Resp 19  Ht 5\' 6"  (1.676 m)  Wt 57.607 kg (127 lb)  BMI 20.51 kg/m2  SpO2 97%  LMP 03/21/2014 Vital signs in last 24 hours: Temp:  [97.7 F (36.5 C)-98.5 F (36.9 C)] 98 F (36.7 C) (09/29 1156) Pulse Rate:  [71-98] 77 (09/29 1156) Resp:  [18-19] 19 (09/29 1156) BP: (115-145)/(74-89) 115/74 mmHg (09/29 1156) SpO2:  [97 %-100 %] 97 % (09/29 1156) Weight:  [57.607 kg (127 lb)] 57.607 kg (127 lb) (09/29 0405)  Intake/Output from previous day:   Intake/Output this shift: Total I/O In: 360 [P.O.:360] Out: -  Nutritional status: Cardiac  Neurologic Exam: Mental Status:  Alert, oriented, thought content appropriate. Speech fluent without evidence of aphasia. Able to follow 3 step commands without difficulty.  Cranial Nerves:  II: Discs flat bilaterally; Visual fields grossly normal, pupils equal, round, reactive to light and accommodation  III,IV, VI: ptosis not present, extra-ocular motions intact bilaterally  V,VII: smile symmetric, facial light touch sensation normal bilaterally  VIII: hearing normal bilaterally  IX,X: gag reflex present  XI: bilateral shoulder shrug  XII: midline tongue extension without atrophy or fasciculations  Motor:  Inconsistent motor exam, doesn't offer full effort, and overall right sided weakness seems to be functional. She exhibits almost constant shakiness with associated increased tone right leg that subsides when she gets distracted or when using the extremity.  Tone and bulk:normal tone throughout; no atrophy noted  Sensory: Pinprick and light touch intact throughout, bilaterally  Deep Tendon Reflexes:  2+ throughout  Plantars:  Right: downgoing    Left: downgoing     Lab Results: Basic Metabolic  Panel:  Recent Labs Lab 04/15/14 1926 04/15/14 1955 04/17/14 0547  NA 141 141 138  K 3.9 4.1 3.7  CL 100 104 101  CO2 24  --  24  GLUCOSE 109* 110* 101*  BUN 5* <3* 7  CREATININE 0.47* 0.90 0.44*  CALCIUM 8.9  --  8.4    Liver Function Tests:  Recent Labs Lab 04/15/14 1926  AST 57*  ALT 23  ALKPHOS 158*  BILITOT 0.5  PROT 7.6  ALBUMIN 4.6   No results found for this basename: LIPASE, AMYLASE,  in the last 168 hours No results found for this basename: AMMONIA,  in the last 168 hours  CBC:  Recent Labs Lab 04/15/14 1926 04/15/14 1955  WBC 2.7*  --   NEUTROABS 1.2*  --   HGB 10.8* 12.9  HCT 33.0* 38.0  MCV 91.7  --   PLT 250  --     Cardiac Enzymes: No results found for this basename: CKTOTAL, CKMB, CKMBINDEX, TROPONINI,  in the last 168 hours  Lipid Panel: No results found for this basename: CHOL, TRIG, HDL, CHOLHDL, VLDL, LDLCALC,  in the last 168 hours  CBG: No results found for this basename: GLUCAP,  in the last 168 hours  Microbiology: Results for orders placed during the hospital encounter of 02/13/14  MRSA PCR SCREENING     Status: None   Collection Time    02/14/14 12:27 AM      Result Value Ref Range Status   MRSA by PCR NEGATIVE  NEGATIVE Final   Comment:            The GeneXpert MRSA  Assay (FDA     approved for NASAL specimens     only), is one component of a     comprehensive MRSA colonization     surveillance program. It is not     intended to diagnose MRSA     infection nor to guide or     monitor treatment for     MRSA infections.    Coagulation Studies:  Recent Labs  04/15/14 1926  LABPROT 13.9  INR 1.07    Imaging: Ct Head Wo Contrast  04/15/2014   CLINICAL DATA:  Right head pain, blurred vision, red swollen area to posterior head, shooting pain down neck  EXAM: CT HEAD WITHOUT CONTRAST  CT CERVICAL SPINE WITHOUT CONTRAST  TECHNIQUE: Multidetector CT imaging of the head and cervical spine was performed following the  standard protocol without intravenous contrast. Multiplanar CT image reconstructions of the cervical spine were also generated.  COMPARISON:  CT head dated 04/04/2014  FINDINGS: CT HEAD FINDINGS  No evidence of parenchymal hemorrhage or extra-axial fluid collection. No mass lesion, mass effect, or midline shift.  No CT evidence of acute infarction.  Mild chronic hypodensity in the subinsular region bilaterally.  Cerebral volume is within normal limits.  No ventriculomegaly.  The visualized paranasal sinuses are essentially clear. The mastoid air cells are unopacified.  No evidence of calvarial fracture.  CT CERVICAL SPINE FINDINGS  Reversal the normal cervical lordosis.  No evidence of fracture or dislocation. Vertebral body heights and intervertebral disc spaces are maintained. Dens appears intact.  No prevertebral soft tissue swelling.  Visualized thyroid is unremarkable.  Visualized lung apices are notable for mild biapical pleural parenchymal scarring.  IMPRESSION: No evidence of acute intracranial abnormality.  No evidence of traumatic injury to the cervical spine.   Electronically Signed   By: Julian Hy M.D.   On: 04/15/2014 20:27   Ct Cervical Spine Wo Contrast  04/15/2014   CLINICAL DATA:  Right head pain, blurred vision, red swollen area to posterior head, shooting pain down neck  EXAM: CT HEAD WITHOUT CONTRAST  CT CERVICAL SPINE WITHOUT CONTRAST  TECHNIQUE: Multidetector CT imaging of the head and cervical spine was performed following the standard protocol without intravenous contrast. Multiplanar CT image reconstructions of the cervical spine were also generated.  COMPARISON:  CT head dated 04/04/2014  FINDINGS: CT HEAD FINDINGS  No evidence of parenchymal hemorrhage or extra-axial fluid collection. No mass lesion, mass effect, or midline shift.  No CT evidence of acute infarction.  Mild chronic hypodensity in the subinsular region bilaterally.  Cerebral volume is within normal limits.  No  ventriculomegaly.  The visualized paranasal sinuses are essentially clear. The mastoid air cells are unopacified.  No evidence of calvarial fracture.  CT CERVICAL SPINE FINDINGS  Reversal the normal cervical lordosis.  No evidence of fracture or dislocation. Vertebral body heights and intervertebral disc spaces are maintained. Dens appears intact.  No prevertebral soft tissue swelling.  Visualized thyroid is unremarkable.  Visualized lung apices are notable for mild biapical pleural parenchymal scarring.  IMPRESSION: No evidence of acute intracranial abnormality.  No evidence of traumatic injury to the cervical spine.   Electronically Signed   By: Julian Hy M.D.   On: 04/15/2014 20:27   Mr Brain Wo Contrast  04/16/2014   CLINICAL DATA:  38 year old female with right-sided weakness and right extremity tremors and weakness. Seizure-like episode, unwitnessed. Initial encounter.  EXAM: MRI HEAD WITHOUT CONTRAST  TECHNIQUE: Multiplanar, multiecho pulse sequences of the brain  and surrounding structures were obtained without intravenous contrast.  COMPARISON:  Head CT without contrast 04/15/2014 and earlier.  FINDINGS: Cerebral volume is normal. No restricted diffusion to suggest acute infarction. No midline shift, mass effect, evidence of mass lesion, ventriculomegaly, extra-axial collection or acute intracranial hemorrhage. Cervicomedullary junction and pituitary are within normal limits. Negative visualized cervical spine. Major intracranial vascular flow voids are preserved.  Largely unremarkable for age gray and white matter signal throughout the brain. Mesial temporal lobe signal appears symmetric and within normal limits.  Visible internal auditory structures appear normal. Visualized paranasal sinuses and mastoids are clear. Visualized orbit soft tissues are within normal limits. Normal bone marrow signal. Visualized scalp soft tissues are within normal limits.  IMPRESSION: Negative for age non contrast  MRI appearance of the brain.   Electronically Signed   By: Lars Pinks M.D.   On: 04/16/2014 15:34    Medications:  I have reviewed the patient's current medications. Scheduled: . aspirin  325 mg Oral Daily  . carvedilol  1.56 mg Oral BID WC  . enoxaparin (LOVENOX) injection  40 mg Subcutaneous QHS  . FLUoxetine  40 mg Oral Daily  . folic acid  1 mg Oral Daily  . levETIRAcetam  1,500 mg Oral BID  . LORazepam  0-4 mg Intravenous Q6H   Followed by  . [START ON 04/18/2014] LORazepam  0-4 mg Intravenous Q12H  . multivitamin with minerals  1 tablet Oral Daily  . thiamine  100 mg Oral Daily  . traZODone  100 mg Oral QHS    Assessment/Plan: Patient's current presentation does not suggest a physiologic origin.  MRI of the brain reviewed and shows no acute changes.  EEG reviewed and normal.    Recommendations: 1.  Continue Keppra at current dose 2.  PT 3.  Patient with history of ETOH w/d.  Will need to watch for signs and symptoms.     LOS: 2 days   Alexis Goodell, MD [facility] [phone] 04/17/2014  12:52 PM

## 2014-04-19 ENCOUNTER — Inpatient Hospital Stay: Payer: Self-pay | Admitting: Internal Medicine

## 2014-04-19 ENCOUNTER — Emergency Department (HOSPITAL_COMMUNITY)
Admission: EM | Admit: 2014-04-19 | Discharge: 2014-04-19 | Disposition: A | Discharge status: Home / Self Care | Payer: Self-pay | Attending: Emergency Medicine | Admitting: Emergency Medicine

## 2014-04-19 ENCOUNTER — Emergency Department (HOSPITAL_COMMUNITY): Payer: Self-pay

## 2014-04-19 ENCOUNTER — Encounter (HOSPITAL_COMMUNITY): Payer: Self-pay | Admitting: Emergency Medicine

## 2014-04-19 DIAGNOSIS — S3981XA Other specified injuries of abdomen, initial encounter: Secondary | ICD-10-CM | POA: Insufficient documentation

## 2014-04-19 DIAGNOSIS — Z8742 Personal history of other diseases of the female genital tract: Secondary | ICD-10-CM | POA: Insufficient documentation

## 2014-04-19 DIAGNOSIS — Z880 Allergy status to penicillin: Secondary | ICD-10-CM | POA: Insufficient documentation

## 2014-04-19 DIAGNOSIS — Z8674 Personal history of sudden cardiac arrest: Secondary | ICD-10-CM | POA: Insufficient documentation

## 2014-04-19 DIAGNOSIS — Z79899 Other long term (current) drug therapy: Secondary | ICD-10-CM | POA: Insufficient documentation

## 2014-04-19 DIAGNOSIS — F419 Anxiety disorder, unspecified: Secondary | ICD-10-CM | POA: Insufficient documentation

## 2014-04-19 DIAGNOSIS — S0990XA Unspecified injury of head, initial encounter: Secondary | ICD-10-CM | POA: Insufficient documentation

## 2014-04-19 DIAGNOSIS — G40909 Epilepsy, unspecified, not intractable, without status epilepticus: Secondary | ICD-10-CM | POA: Insufficient documentation

## 2014-04-19 DIAGNOSIS — R109 Unspecified abdominal pain: Secondary | ICD-10-CM | POA: Insufficient documentation

## 2014-04-19 DIAGNOSIS — Z8719 Personal history of other diseases of the digestive system: Secondary | ICD-10-CM | POA: Insufficient documentation

## 2014-04-19 DIAGNOSIS — S40011A Contusion of right shoulder, initial encounter: Secondary | ICD-10-CM | POA: Insufficient documentation

## 2014-04-19 DIAGNOSIS — S7001XA Contusion of right hip, initial encounter: Secondary | ICD-10-CM | POA: Insufficient documentation

## 2014-04-19 DIAGNOSIS — D649 Anemia, unspecified: Secondary | ICD-10-CM | POA: Insufficient documentation

## 2014-04-19 DIAGNOSIS — Z791 Long term (current) use of non-steroidal anti-inflammatories (NSAID): Secondary | ICD-10-CM | POA: Insufficient documentation

## 2014-04-19 DIAGNOSIS — F329 Major depressive disorder, single episode, unspecified: Secondary | ICD-10-CM | POA: Insufficient documentation

## 2014-04-19 MED ORDER — HYDROMORPHONE HCL 1 MG/ML IJ SOLN
1.0000 mg | Freq: Once | INTRAMUSCULAR | Status: AC
  Administered 2014-04-19: 1 mg via INTRAMUSCULAR
  Filled 2014-04-19: qty 1

## 2014-04-19 MED ORDER — NAPROXEN 500 MG PO TABS: 500.0000 mg | ORAL_TABLET | Freq: Two times a day (BID) | ORAL | Status: DC

## 2014-04-19 MED ORDER — IOHEXOL 300 MG/ML  SOLN
100.0000 mL | Freq: Once | INTRAMUSCULAR | Status: AC | PRN
  Administered 2014-04-19: 100 mL via INTRAVENOUS

## 2014-04-19 MED ORDER — HYDROCODONE-ACETAMINOPHEN 5-325 MG PO TABS: 2.0000 | ORAL_TABLET | ORAL | Status: DC | PRN
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
## 2014-04-19 NOTE — Discharge Instructions (Signed)
Please call your doctor for a followup appointment within 24-48 hours. When you talk to your doctor please let them know that you were seen in the emergency department and have them acquire all of your records so that they can discuss the findings with you and formulate a treatment plan to fully care for your new and ongoing problems. ° °

## 2014-04-19 NOTE — Progress Notes (Signed)
04/19/2014 A. Teondre Jarosz RNCM 1721pm EDCM provided patient with resources provided by [facility]DS[facility], bus pass, crisis phone numbers, and list of local shelters.  Patient reports, "Oh I have my own place to go, but I will take the resources."  Patient thankful for assistance.  No further EDCM needs at this time.
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
## 2014-04-19 NOTE — ED Notes (Signed)
Pt requesting pain medicine before discharge. Contacted MD Sabra Heck. Patient aware MD does not want to give pain medications before leaving. Sent home with AVS-explained prescriptions. Knows not to drink alcohol/operate heavy machinery/drive while taking medications. Advised to follow up asap with [facility]. Friend is driving patient home. No other questions/concerns. Neurologically intact. Ambulatory with steady gait. rr even/unlabored. Speaking full/clear sentences.

## 2014-04-19 NOTE — ED Notes (Signed)
Patient transported to X-ray 
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
## 2014-04-19 NOTE — ED Notes (Signed)
Patient transported to CT 

## 2014-04-21 ENCOUNTER — Emergency Department (HOSPITAL_COMMUNITY)
Admission: EM | Admit: 2014-04-21 | Discharge: 2014-04-21 | Disposition: A | Discharge status: Home / Self Care | Payer: Self-pay | Attending: Emergency Medicine | Admitting: Emergency Medicine

## 2014-04-21 ENCOUNTER — Encounter (HOSPITAL_COMMUNITY): Payer: Self-pay | Admitting: Emergency Medicine

## 2014-04-21 ENCOUNTER — Emergency Department (HOSPITAL_COMMUNITY): Payer: Self-pay

## 2014-04-21 DIAGNOSIS — D649 Anemia, unspecified: Secondary | ICD-10-CM | POA: Insufficient documentation

## 2014-04-21 DIAGNOSIS — F1010 Alcohol abuse, uncomplicated: Secondary | ICD-10-CM | POA: Insufficient documentation

## 2014-04-21 DIAGNOSIS — Z8719 Personal history of other diseases of the digestive system: Secondary | ICD-10-CM | POA: Insufficient documentation

## 2014-04-21 DIAGNOSIS — Z79899 Other long term (current) drug therapy: Secondary | ICD-10-CM | POA: Insufficient documentation

## 2014-04-21 DIAGNOSIS — R079 Chest pain, unspecified: Secondary | ICD-10-CM | POA: Insufficient documentation

## 2014-04-21 DIAGNOSIS — Z8742 Personal history of other diseases of the female genital tract: Secondary | ICD-10-CM | POA: Insufficient documentation

## 2014-04-21 DIAGNOSIS — R112 Nausea with vomiting, unspecified: Secondary | ICD-10-CM | POA: Insufficient documentation

## 2014-04-21 DIAGNOSIS — Z880 Allergy status to penicillin: Secondary | ICD-10-CM | POA: Insufficient documentation

## 2014-04-21 DIAGNOSIS — M791 Myalgia: Secondary | ICD-10-CM | POA: Insufficient documentation

## 2014-04-21 DIAGNOSIS — R197 Diarrhea, unspecified: Secondary | ICD-10-CM | POA: Insufficient documentation

## 2014-04-21 DIAGNOSIS — F329 Major depressive disorder, single episode, unspecified: Secondary | ICD-10-CM | POA: Insufficient documentation

## 2014-04-21 DIAGNOSIS — F419 Anxiety disorder, unspecified: Secondary | ICD-10-CM | POA: Insufficient documentation

## 2014-04-21 DIAGNOSIS — G40909 Epilepsy, unspecified, not intractable, without status epilepticus: Secondary | ICD-10-CM | POA: Insufficient documentation

## 2014-04-21 LAB — COMPREHENSIVE METABOLIC PANEL
ALK PHOS: 131 U/L — AB (ref 39–117)
ALT: 18 U/L (ref 0–35)
AST: 58 U/L — ABNORMAL HIGH (ref 0–37)
Albumin: 4.6 g/dL (ref 3.5–5.2)
Anion gap: 16 — ABNORMAL HIGH (ref 5–15)
BILIRUBIN TOTAL: 0.4 mg/dL (ref 0.3–1.2)
BUN: 7 mg/dL (ref 6–23)
CHLORIDE: 101 meq/L (ref 96–112)
CO2: 24 meq/L (ref 19–32)
CREATININE: 0.43 mg/dL — AB (ref 0.50–1.10)
Calcium: 8.7 mg/dL (ref 8.4–10.5)
GFR calc non Af Amer: >90 mL/min (ref >90)
GLUCOSE: 122 mg/dL — AB (ref 70–99)
POTASSIUM: 3.8 meq/L (ref 3.7–5.3)
Sodium: 141 mEq/L (ref 137–147)
Total Protein: 7.9 g/dL (ref 6.0–8.3)

## 2014-04-21 LAB — CBC WITH DIFFERENTIAL/PLATELET
BASOS PCT: 1 % (ref 0–1)
Basophils Absolute: 0 10*3/uL (ref 0.0–0.1)
Eosinophils Absolute: 0 10*3/uL (ref 0.0–0.7)
Eosinophils Relative: 0 % (ref 0–5)
HEMATOCRIT: 33.9 % — AB (ref 36.0–46.0)
Hemoglobin: 11.1 g/dL — ABNORMAL LOW (ref 12.0–15.0)
LYMPHS ABS: 1.4 10*3/uL (ref 0.7–4.0)
LYMPHS PCT: 42 % (ref 12–46)
MCH: 29.9 pg (ref 26.0–34.0)
MCHC: 32.7 g/dL (ref 30.0–36.0)
MCV: 91.4 fL (ref 78.0–100.0)
MONO ABS: 0.3 10*3/uL (ref 0.1–1.0)
MONOS PCT: 7 % (ref 3–12)
NEUTROS PCT: 50 % (ref 43–77)
Neutro Abs: 1.7 10*3/uL (ref 1.7–7.7)
Platelets: 279 10*3/uL (ref 150–400)
RBC: 3.71 MIL/uL — AB (ref 3.87–5.11)
RDW: 18.7 % — ABNORMAL HIGH (ref 11.5–15.5)
WBC: 3.4 10*3/uL — ABNORMAL LOW (ref 4.0–10.5)

## 2014-04-21 LAB — I-STAT TROPONIN, ED: Troponin i, poc: 0 ng/mL (ref 0.00–0.08)

## 2014-04-21 LAB — POC OCCULT BLOOD, ED: Fecal Occult Bld: POSITIVE — AB

## 2014-04-21 LAB — MAGNESIUM: Magnesium: 2.2 mg/dL (ref 1.5–2.5)

## 2014-04-21 LAB — ETHANOL: ALCOHOL ETHYL (B): 377 mg/dL — AB (ref 0–11)

## 2014-04-21 MED ORDER — HYDROMORPHONE HCL 1 MG/ML IJ SOLN
1.0000 mg | Freq: Once | INTRAMUSCULAR | Status: AC
  Administered 2014-04-21: 1 mg via INTRAVENOUS
  Filled 2014-04-21: qty 1

## 2014-04-21 MED ORDER — SODIUM CHLORIDE 0.9 % IV BOLUS (SEPSIS)
1000.0000 mL | Freq: Once | INTRAVENOUS | Status: AC
  Administered 2014-04-21: 1000 mL via INTRAVENOUS

## 2014-04-21 MED ORDER — ONDANSETRON HCL 4 MG/2ML IJ SOLN
4.0000 mg | Freq: Once | INTRAMUSCULAR | Status: AC
  Administered 2014-04-21: 4 mg via INTRAVENOUS
  Filled 2014-04-21: qty 2

## 2014-04-21 MED ORDER — KETOROLAC TROMETHAMINE 30 MG/ML IJ SOLN
30.0000 mg | Freq: Once | INTRAMUSCULAR | Status: AC
  Administered 2014-04-21: 30 mg via INTRAVENOUS
  Filled 2014-04-21: qty 1

## 2014-04-21 NOTE — Discharge Instructions (Signed)
Chest Pain (Nonspecific) °It is often hard to give a specific diagnosis for the cause of chest pain. There is always a chance that your pain could be related to something serious, such as a heart attack or a blood clot in the lungs. You need to follow up with your health care provider for further evaluation. °CAUSES  °· Heartburn. °· Pneumonia or bronchitis. °· Anxiety or stress. °· Inflammation around your heart (pericarditis) or lung (pleuritis or pleurisy). °· A blood clot in the lung. °· A collapsed lung (pneumothorax). It can develop suddenly on its own (spontaneous pneumothorax) or from trauma to the chest. °· Shingles infection (herpes zoster virus). °The chest wall is composed of bones, muscles, and cartilage. Any of these can be the source of the pain. °· The bones can be bruised by injury. °· The muscles or cartilage can be strained by coughing or overwork. °· The cartilage can be affected by inflammation and become sore (costochondritis). °DIAGNOSIS  °Lab tests or other studies may be needed to find the cause of your pain. Your health care provider may have you take a test called an ambulatory electrocardiogram (ECG). An ECG records your heartbeat patterns over a 24-hour period. You may also have other tests, such as: °· Transthoracic echocardiogram (TTE). During echocardiography, sound waves are used to evaluate how blood flows through your heart. °· Transesophageal echocardiogram (TEE). °· Cardiac monitoring. This allows your health care provider to monitor your heart rate and rhythm in real time. °· Holter monitor. This is a portable device that records your heartbeat and can help diagnose heart arrhythmias. It allows your health care provider to track your heart activity for several days, if needed. °· Stress tests by exercise or by giving medicine that makes the heart beat faster. °TREATMENT  °· Treatment depends on what may be causing your chest pain. Treatment may include: °¨ Acid blockers for  heartburn. °¨ Anti-inflammatory medicine. °¨ Pain medicine for inflammatory conditions. °¨ Antibiotics if an infection is present. °· You may be advised to change lifestyle habits. This includes stopping smoking and avoiding alcohol, caffeine, and chocolate. °· You may be advised to keep your head raised (elevated) when sleeping. This reduces the chance of acid going backward from your stomach into your esophagus. °Most of the time, nonspecific chest pain will improve within 2-3 days with rest and mild pain medicine.  °HOME CARE INSTRUCTIONS  °· If antibiotics were prescribed, take them as directed. Finish them even if you start to feel better. °· For the next few days, avoid physical activities that bring on chest pain. Continue physical activities as directed. °· Do not use any tobacco products, including cigarettes, chewing tobacco, or electronic cigarettes. °· Avoid drinking alcohol. °· Only take medicine as directed by your health care provider. °· Follow your health care provider's suggestions for further testing if your chest pain does not go away. °· Keep any follow-up appointments you made. If you do not go to an appointment, you could develop lasting (chronic) problems with pain. If there is any problem keeping an appointment, call to reschedule. °SEEK MEDICAL CARE IF:  °· Your chest pain does not go away, even after treatment. °· You have a rash with blisters on your chest. °· You have a fever. °SEEK IMMEDIATE MEDICAL CARE IF:  °· You have increased chest pain or pain that spreads to your arm, neck, jaw, back, or abdomen. °· You have shortness of breath. °· You have an increasing cough, or you cough   up blood.  You have severe back or abdominal pain.  You feel nauseous or vomit.  You have severe weakness.  You faint.  You have chills. This is an emergency. Do not wait to see if the pain will go away. Get medical help at once. Call your local emergency services (911 in [place] Do not drive  yourself to the hospital. MAKE SURE YOU:   Understand these instructions.  Will watch your condition.  Will get help right away if you are not doing well or get worse. Document Released: 04/15/2005 Document Revised: 07/11/2013 Document Reviewed: 02/09/2008 [facility] Patient Information 2015 [place], [place]. This information is not intended to replace advice given to you by your health care provider. Make sure you discuss any questions you have with your health care provider.  Alcohol Intoxication Alcohol intoxication occurs when the amount of alcohol that a person has consumed impairs his or her ability to mentally and physically function. Alcohol directly impairs the normal chemical activity of the brain. Drinking large amounts of alcohol can lead to changes in mental function and behavior, and it can cause many physical effects that can be harmful.  Alcohol intoxication can range in severity from mild to very severe. Various factors can affect the level of intoxication that occurs, such as the person's age, gender, weight, frequency of alcohol consumption, and the presence of other medical conditions (such as diabetes, seizures, or heart conditions). Dangerous levels of alcohol intoxication may occur when people drink large amounts of alcohol in a short period (binge drinking). Alcohol can also be especially dangerous when combined with certain prescription medicines or "recreational" drugs. SIGNS AND SYMPTOMS Some common signs and symptoms of mild alcohol intoxication include:  Loss of coordination.  Changes in mood and behavior.  Impaired judgment.  Slurred speech. As alcohol intoxication progresses to more severe levels, other signs and symptoms will appear. These may include:  Vomiting.  Confusion and impaired memory.  Slowed breathing.  Seizures.  Loss of consciousness. DIAGNOSIS  Your health care provider will take a medical history and perform a physical exam. You will be  asked about the amount and type of alcohol you have consumed. Blood tests will be done to measure the concentration of alcohol in your blood. In many places, your blood alcohol level must be lower than 80 mg/dL (0.08%) to legally drive. However, many dangerous effects of alcohol can occur at much lower levels.  TREATMENT  People with alcohol intoxication often do not require treatment. Most of the effects of alcohol intoxication are temporary, and they go away as the alcohol naturally leaves the body. Your health care provider will monitor your condition until you are stable enough to go home. Fluids are sometimes given through an IV access tube to help prevent dehydration.  HOME CARE INSTRUCTIONS  Do not drive after drinking alcohol.  Stay hydrated. Drink enough water and fluids to keep your urine clear or pale yellow. Avoid caffeine.   Only take over-the-counter or prescription medicines as directed by your health care provider.  SEEK MEDICAL CARE IF:   You have persistent vomiting.   You do not feel better after a few days.  You have frequent alcohol intoxication. Your health care provider can help determine if you should see a substance use treatment counselor. SEEK IMMEDIATE MEDICAL CARE IF:   You become shaky or tremble when you try to stop drinking.   You shake uncontrollably (seizure).   You throw up (vomit) blood. This may be bright red  or may look like black coffee grounds.   You have blood in your stool. This may be bright red or may appear as a black, tarry, bad smelling stool.   You become lightheaded or faint.  MAKE SURE YOU:   Understand these instructions.  Will watch your condition.  Will get help right away if you are not doing well or get worse. Document Released: 04/15/2005 Document Revised: 03/08/2013 Document Reviewed: 12/09/2012 [facility] Patient Information 2015 [place], [place]. This information is not intended to replace advice given to you by your  health care provider. Make sure you discuss any questions you have with your health care provider.

## 2014-04-21 NOTE — ED Notes (Signed)
Pt stated she was unable to give a ua at this time
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
## 2014-04-26 ENCOUNTER — Emergency Department (HOSPITAL_COMMUNITY): Payer: Self-pay

## 2014-04-26 ENCOUNTER — Ambulatory Visit: Payer: Self-pay | Admitting: Internal Medicine

## 2014-04-26 ENCOUNTER — Ambulatory Visit: Payer: MEDICAID | Attending: Internal Medicine | Admitting: Rehabilitative and Restorative Service Providers"

## 2014-04-26 ENCOUNTER — Encounter (HOSPITAL_COMMUNITY): Payer: Self-pay | Admitting: Emergency Medicine

## 2014-04-26 ENCOUNTER — Emergency Department (HOSPITAL_COMMUNITY)
Admission: EM | Admit: 2014-04-26 | Discharge: 2014-04-26 | Discharge status: Against Medical Advice | Payer: Self-pay | Attending: Emergency Medicine | Admitting: Emergency Medicine

## 2014-04-26 DIAGNOSIS — S0083XA Contusion of other part of head, initial encounter: Secondary | ICD-10-CM | POA: Insufficient documentation

## 2014-04-26 DIAGNOSIS — S7001XA Contusion of right hip, initial encounter: Secondary | ICD-10-CM | POA: Insufficient documentation

## 2014-04-26 DIAGNOSIS — Z880 Allergy status to penicillin: Secondary | ICD-10-CM | POA: Insufficient documentation

## 2014-04-26 DIAGNOSIS — W19XXXA Unspecified fall, initial encounter: Secondary | ICD-10-CM

## 2014-04-26 DIAGNOSIS — F329 Major depressive disorder, single episode, unspecified: Secondary | ICD-10-CM | POA: Insufficient documentation

## 2014-04-26 DIAGNOSIS — Z79899 Other long term (current) drug therapy: Secondary | ICD-10-CM | POA: Insufficient documentation

## 2014-04-26 DIAGNOSIS — W01198A Fall on same level from slipping, tripping and stumbling with subsequent striking against other object, initial encounter: Secondary | ICD-10-CM | POA: Insufficient documentation

## 2014-04-26 DIAGNOSIS — F419 Anxiety disorder, unspecified: Secondary | ICD-10-CM | POA: Insufficient documentation

## 2014-04-26 DIAGNOSIS — Z8742 Personal history of other diseases of the female genital tract: Secondary | ICD-10-CM | POA: Insufficient documentation

## 2014-04-26 DIAGNOSIS — Z8719 Personal history of other diseases of the digestive system: Secondary | ICD-10-CM | POA: Insufficient documentation

## 2014-04-26 DIAGNOSIS — Y9389 Activity, other specified: Secondary | ICD-10-CM | POA: Insufficient documentation

## 2014-04-26 DIAGNOSIS — S199XXA Unspecified injury of neck, initial encounter: Secondary | ICD-10-CM | POA: Insufficient documentation

## 2014-04-26 DIAGNOSIS — Y92009 Unspecified place in unspecified non-institutional (private) residence as the place of occurrence of the external cause: Secondary | ICD-10-CM | POA: Insufficient documentation

## 2014-04-26 DIAGNOSIS — G40909 Epilepsy, unspecified, not intractable, without status epilepticus: Secondary | ICD-10-CM | POA: Insufficient documentation

## 2014-04-26 DIAGNOSIS — Z862 Personal history of diseases of the blood and blood-forming organs and certain disorders involving the immune mechanism: Secondary | ICD-10-CM | POA: Insufficient documentation

## 2014-04-26 DIAGNOSIS — S299XXA Unspecified injury of thorax, initial encounter: Secondary | ICD-10-CM | POA: Insufficient documentation

## 2014-04-26 DIAGNOSIS — Z8674 Personal history of sudden cardiac arrest: Secondary | ICD-10-CM | POA: Insufficient documentation

## 2014-04-26 LAB — URINE MICROSCOPIC-ADD ON

## 2014-04-26 LAB — BASIC METABOLIC PANEL
Anion gap: 15 (ref 5–15)
BUN: 5 mg/dL — AB (ref 6–23)
CHLORIDE: 103 meq/L (ref 96–112)
CO2: 25 mEq/L (ref 19–32)
CREATININE: 0.4 mg/dL — AB (ref 0.50–1.10)
Calcium: 8.1 mg/dL — ABNORMAL LOW (ref 8.4–10.5)
GFR calc Af Amer: >90 mL/min (ref >90)
GFR calc non Af Amer: >90 mL/min (ref >90)
GLUCOSE: 108 mg/dL — AB (ref 70–99)
POTASSIUM: 3.4 meq/L — AB (ref 3.7–5.3)
Sodium: 143 mEq/L (ref 137–147)

## 2014-04-26 LAB — CBC WITH DIFFERENTIAL/PLATELET
BASOS PCT: 2 % — AB (ref 0–1)
Basophils Absolute: 0.1 10*3/uL (ref 0.0–0.1)
EOS ABS: 0 10*3/uL (ref 0.0–0.7)
Eosinophils Relative: 0 % (ref 0–5)
HCT: 33.7 % — ABNORMAL LOW (ref 36.0–46.0)
HEMOGLOBIN: 11.1 g/dL — AB (ref 12.0–15.0)
LYMPHS ABS: 1 10*3/uL (ref 0.7–4.0)
Lymphocytes Relative: 39 % (ref 12–46)
MCH: 29.7 pg (ref 26.0–34.0)
MCHC: 32.9 g/dL (ref 30.0–36.0)
MCV: 90.1 fL (ref 78.0–100.0)
MONO ABS: 0.2 10*3/uL (ref 0.1–1.0)
Monocytes Relative: 8 % (ref 3–12)
NEUTROS PCT: 51 % (ref 43–77)
Neutro Abs: 1.4 10*3/uL — ABNORMAL LOW (ref 1.7–7.7)
Platelets: 120 10*3/uL — ABNORMAL LOW (ref 150–400)
RBC: 3.74 MIL/uL — AB (ref 3.87–5.11)
RDW: 18.7 % — ABNORMAL HIGH (ref 11.5–15.5)
WBC: 2.6 10*3/uL — ABNORMAL LOW (ref 4.0–10.5)

## 2014-04-26 LAB — URINALYSIS, ROUTINE W REFLEX MICROSCOPIC
BILIRUBIN URINE: NEGATIVE
Glucose, UA: NEGATIVE mg/dL
Ketones, ur: NEGATIVE mg/dL
Nitrite: NEGATIVE
Protein, ur: NEGATIVE mg/dL
Specific Gravity, Urine: 1.006 (ref 1.005–1.030)
Urobilinogen, UA: 0.2 mg/dL (ref 0.0–1.0)
pH: 6.5 (ref 5.0–8.0)

## 2014-04-26 LAB — ETHANOL: Alcohol, Ethyl (B): 378 mg/dL — ABNORMAL HIGH (ref 0–11)

## 2014-04-26 MED ORDER — HYDROCODONE-ACETAMINOPHEN 5-325 MG PO TABS
1.0000 | ORAL_TABLET | Freq: Once | ORAL | Status: DC
  Filled 2014-04-26 (×2): qty 1

## 2014-04-26 NOTE — Discharge Instructions (Signed)
Head Injury You have a head injury. Headaches and throwing up (vomiting) are common after a head injury. It should be easy to wake up from sleeping. Sometimes you must stay in the hospital. Most problems happen within the first 24 hours. Side effects may occur up to 7-10 days after the injury.  WHAT ARE THE TYPES OF HEAD INJURIES? Head injuries can be as minor as a bump. Some head injuries can be more severe. More severe head injuries include:  A jarring injury to the brain (concussion).  A bruise of the brain (contusion). This mean there is bleeding in the brain that can cause swelling.  A cracked skull (skull fracture).  Bleeding in the brain that collects, clots, and forms a bump (hematoma). WHEN SHOULD I GET HELP RIGHT AWAY?   You are confused or sleepy.  You cannot be woken up.  You feel sick to your stomach (nauseous) or keep throwing up (vomiting).  Your dizziness or unsteadiness is getting worse.  You have very bad, lasting headaches that are not helped by medicine. Take medicines only as told by your doctor.  You cannot use your arms or legs like normal.  You cannot walk.  You notice changes in the black spots in the center of the colored part of your eye (pupil).  You have clear or bloody fluid coming from your nose or ears.  You have trouble seeing. During the next 24 hours after the injury, you must stay with someone who can watch you. This person should get help right away (call 911 in the [place] if you start to shake and are not able to control it (have seizures), you pass out, or you are unable to wake up. HOW CAN I PREVENT A HEAD INJURY IN THE FUTURE?  Wear seat belts.  Wear a helmet while bike riding and playing sports like football.  Stay away from dangerous activities around the house. WHEN CAN I RETURN TO NORMAL ACTIVITIES AND ATHLETICS? See your doctor before doing these activities. You should not do normal activities or play contact sports until 1 week  after the following symptoms have stopped:  Headache that does not go away.  Dizziness.  Poor attention.  Confusion.  Memory problems.  Sickness to your stomach or throwing up.  Tiredness.  Fussiness.  Bothered by bright lights or loud noises.  Anxiousness or depression.  Restless sleep. MAKE SURE YOU:   Understand these instructions.  Will watch your condition.  Will get help right away if you are not doing well or get worse. Document Released: 06/18/2008 Document Revised: 11/20/2013 Document Reviewed: 03/13/2013 [facility] Patient Information 2015 [place], [place]. This information is not intended to replace advice given to you by your health care provider. Make sure you discuss any questions you have with your health care provider.  YOU HAVE DECIDED NOT TO STAY IN THE ED FOR COMPLETION OF DIAGNOSTIC TESTING IN THE EVALUATION OF YOUR RECENT FALL AT HOME.  PLEASE USE THE ATTACHED INSTRUCTIONS AS A RESOURCE FOR SYMPTOMS TO BE AWARE OF THAT ARE CONCERNING AND SHOULD PROMPT A RETURN FOR ADDITIONAL EVALUATION.

## 2014-04-26 NOTE — ED Notes (Signed)
Pt refused vicodin. States she wants to leave. PA informed.

## 2014-04-26 NOTE — ED Notes (Signed)
Pt refuses to have EKG, and is ready to go home. RN aware
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
## 2014-04-26 NOTE — ED Notes (Signed)
Pt took off collar and independently walked to restroom
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
## 2014-04-26 NOTE — ED Notes (Signed)
Bed: WA09 Expected date:  Expected time:  Means of arrival:  Comments: ems 

## 2014-04-28 ENCOUNTER — Emergency Department (HOSPITAL_COMMUNITY)
Admission: EM | Admit: 2014-04-28 | Discharge: 2014-04-28 | Disposition: A | Discharge status: Home / Self Care | Payer: Self-pay | Attending: Emergency Medicine | Admitting: Emergency Medicine

## 2014-04-28 ENCOUNTER — Encounter (HOSPITAL_COMMUNITY): Payer: Self-pay | Admitting: Emergency Medicine

## 2014-04-28 DIAGNOSIS — F329 Major depressive disorder, single episode, unspecified: Secondary | ICD-10-CM | POA: Insufficient documentation

## 2014-04-28 DIAGNOSIS — Z79899 Other long term (current) drug therapy: Secondary | ICD-10-CM | POA: Insufficient documentation

## 2014-04-28 DIAGNOSIS — R569 Unspecified convulsions: Secondary | ICD-10-CM | POA: Insufficient documentation

## 2014-04-28 DIAGNOSIS — Z8742 Personal history of other diseases of the female genital tract: Secondary | ICD-10-CM | POA: Insufficient documentation

## 2014-04-28 DIAGNOSIS — K6289 Other specified diseases of anus and rectum: Secondary | ICD-10-CM | POA: Insufficient documentation

## 2014-04-28 DIAGNOSIS — D649 Anemia, unspecified: Secondary | ICD-10-CM | POA: Insufficient documentation

## 2014-04-28 DIAGNOSIS — F410 Panic disorder [episodic paroxysmal anxiety] without agoraphobia: Secondary | ICD-10-CM | POA: Insufficient documentation

## 2014-04-28 DIAGNOSIS — Z880 Allergy status to penicillin: Secondary | ICD-10-CM | POA: Insufficient documentation

## 2014-04-28 DIAGNOSIS — Z8674 Personal history of sudden cardiac arrest: Secondary | ICD-10-CM | POA: Insufficient documentation

## 2014-04-28 DIAGNOSIS — E876 Hypokalemia: Secondary | ICD-10-CM | POA: Insufficient documentation

## 2014-04-28 LAB — I-STAT CHEM 8, ED
BUN: 7 mg/dL (ref 6–23)
CHLORIDE: 104 meq/L (ref 96–112)
Calcium, Ion: 1.04 mmol/L — ABNORMAL LOW (ref 1.12–1.23)
Creatinine, Ser: 0.8 mg/dL (ref 0.50–1.10)
Glucose, Bld: 102 mg/dL — ABNORMAL HIGH (ref 70–99)
HCT: 36 % (ref 36.0–46.0)
HEMOGLOBIN: 12.2 g/dL (ref 12.0–15.0)
POTASSIUM: 3.2 meq/L — AB (ref 3.7–5.3)
SODIUM: 142 meq/L (ref 137–147)
TCO2: 25 mmol/L (ref 0–100)

## 2014-04-28 LAB — CBC
HEMATOCRIT: 32.3 % — AB (ref 36.0–46.0)
Hemoglobin: 10.3 g/dL — ABNORMAL LOW (ref 12.0–15.0)
MCH: 29.6 pg (ref 26.0–34.0)
MCHC: 31.9 g/dL (ref 30.0–36.0)
MCV: 92.8 fL (ref 78.0–100.0)
Platelets: 79 10*3/uL — ABNORMAL LOW (ref 150–400)
RBC: 3.48 MIL/uL — AB (ref 3.87–5.11)
RDW: 19.1 % — ABNORMAL HIGH (ref 11.5–15.5)
WBC: 2.2 10*3/uL — AB (ref 4.0–10.5)

## 2014-04-28 LAB — MAGNESIUM: Magnesium: 1.8 mg/dL (ref 1.5–2.5)

## 2014-04-28 MED ORDER — POTASSIUM CHLORIDE CRYS ER 20 MEQ PO TBCR
40.0000 meq | EXTENDED_RELEASE_TABLET | Freq: Once | ORAL | Status: AC
  Administered 2014-04-28: 40 meq via ORAL
  Filled 2014-04-28: qty 2

## 2014-04-28 MED ORDER — POTASSIUM CHLORIDE CRYS ER 20 MEQ PO TBCR: 20.0000 meq | EXTENDED_RELEASE_TABLET | Freq: Every day | ORAL | Status: DC

## 2014-04-28 MED ORDER — ACETAMINOPHEN 325 MG PO TABS: 650.0000 mg | ORAL_TABLET | Freq: Once | ORAL | Status: DC

## 2014-04-28 NOTE — ED Notes (Signed)
Patient ambulatory to bathroom without difficulty--gait steady

## 2014-04-28 NOTE — ED Notes (Signed)
Bed: MB84 Expected date: 04/28/14 Expected time: 12:59 PM Means of arrival: Ambulance Comments: Med clearance

## 2014-04-28 NOTE — ED Provider Notes (Signed)
Medical screening examination/treatment/procedure(s) were performed by non-physician practitioner and as supervising physician I was immediately available for consultation/collaboration.   EKG Interpretation None      Rolland Porter, MD, Abram Sander   Janice Norrie, MD 04/28/14 [phone]

## 2014-04-28 NOTE — ED Notes (Signed)
Patient called EMS herself for a seizure Patient's female friend stated that he helped patient to the floor while she was coming out of the bathroom---DID NOT FALL, DID NOT HIT HEAD Per EMS, patient's story has changed multiple times: Anxiety, Panic attack, SOB, "Can't breathe" Patient seen 4 times this month for similar complaints

## 2014-04-28 NOTE — ED Notes (Signed)
This nurse was at the bedside to administer PO K-DUR, see MAR Patient states: "I'm not able to stand up at all!" As documented at 1419, patient able to ambulate in hallway to bathroom without difficulty or staff assistance Patient informed that this nurse witnessed her ambulating in the hallway to and from the bathroom without issue

## 2014-04-28 NOTE — ED Notes (Signed)
EDP at bedside  
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
## 2014-04-28 NOTE — ED Notes (Signed)
Patient did not want DC VS taken--wants to go home Patient in NAD upon time of DC Female visitor at bedside and will be driving patient home

## 2014-04-28 NOTE — Discharge Instructions (Signed)
Take potassium pill daily for three days. Follow up with your primary care doctor and your psychiatrist.  Panic Attacks Panic attacks are sudden, short-livedsurges of severe anxiety, fear, or discomfort. They may occur for no reason when you are relaxed, when you are anxious, or when you are sleeping. Panic attacks may occur for a number of reasons:   Healthy people occasionally have panic attacks in extreme, life-threatening situations, such as war or natural disasters. Normal anxiety is a protective mechanism of the body that helps [place] react to danger (fight or flight response).  Panic attacks are often seen with anxiety disorders, such as panic disorder, social anxiety disorder, generalized anxiety disorder, and phobias. Anxiety disorders cause excessive or uncontrollable anxiety. They may interfere with your relationships or other life activities.  Panic attacks are sometimes seen with other mental illnesses, such as depression and posttraumatic stress disorder.  Certain medical conditions, prescription medicines, and drugs of abuse can cause panic attacks. SYMPTOMS  Panic attacks start suddenly, peak within 20 minutes, and are accompanied by four or more of the following symptoms:  Pounding heart or fast heart rate (palpitations).  Sweating.  Trembling or shaking.  Shortness of breath or feeling smothered.  Feeling choked.  Chest pain or discomfort.  Nausea or strange feeling in your stomach.  Dizziness, light-headedness, or feeling like you will faint.  Chills or hot flushes.  Numbness or tingling in your lips or hands and feet.  Feeling that things are not real or feeling that you are not yourself.  Fear of losing control or going crazy.  Fear of dying. Some of these symptoms can mimic serious medical conditions. For example, you may think you are having a heart attack. Although panic attacks can be very scary, they are not life threatening. DIAGNOSIS  Panic attacks  are diagnosed through an assessment by your health care provider. Your health care provider will ask questions about your symptoms, such as where and when they occurred. Your health care provider will also ask about your medical history and use of alcohol and drugs, including prescription medicines. Your health care provider may order blood tests or other studies to rule out a serious medical condition. Your health care provider may refer you to a mental health professional for further evaluation. TREATMENT   Most healthy people who have one or two panic attacks in an extreme, life-threatening situation will not require treatment.  The treatment for panic attacks associated with anxiety disorders or other mental illness typically involves counseling with a mental health professional, medicine, or a combination of both. Your health care provider will help determine what treatment is best for you.  Panic attacks due to physical illness usually go away with treatment of the illness. If prescription medicine is causing panic attacks, talk with your health care provider about stopping the medicine, decreasing the dose, or substituting another medicine.  Panic attacks due to alcohol or drug abuse go away with abstinence. Some adults need professional help in order to stop drinking or using drugs. HOME CARE INSTRUCTIONS   Take all medicines as directed by your health care provider.   Schedule and attend follow-up visits as directed by your health care provider. It is important to keep all your appointments. SEEK MEDICAL CARE IF:  You are not able to take your medicines as prescribed.  Your symptoms do not improve or get worse. SEEK IMMEDIATE MEDICAL CARE IF:   You experience panic attack symptoms that are different than your usual symptoms.  You have serious thoughts about hurting yourself or others.  You are taking medicine for panic attacks and have a serious side effect. MAKE SURE  YOU:  Understand these instructions.  Will watch your condition.  Will get help right away if you are not doing well or get worse. Document Released: 07/06/2005 Document Revised: 07/11/2013 Document Reviewed: 02/17/2013 [facility] Patient Information 2015 [place], [place]. This information is not intended to replace advice given to you by your health care provider. Make sure you discuss any questions you have with your health care provider.

## 2014-04-30 NOTE — ED Provider Notes (Signed)
Medical screening examination/treatment/procedure(s) were conducted as a shared visit with non-physician practitioner(s) and myself.  I personally evaluated the patient during the encounter.  Pt with recurrent visits to the ED.  Presents intoxicated.  Pt was being monitored but before her results were back pt decided she was going to leave.  Pt was able to ambulate without difficulty.  Do not feel that she met criteria to involuntarily hold her in the ED.    Dorie Rank, MD 04/30/14 [phone]

## 2014-05-02 ENCOUNTER — Inpatient Hospital Stay (HOSPITAL_COMMUNITY)
Admission: AD | Admit: 2014-05-02 | Discharge: 2014-05-02 | Disposition: A | Discharge status: Home / Self Care | Payer: MEDICAID | Source: Ambulatory Visit | Attending: Obstetrics and Gynecology | Admitting: Obstetrics and Gynecology

## 2014-05-02 ENCOUNTER — Encounter (HOSPITAL_COMMUNITY): Payer: Self-pay | Admitting: *Deleted

## 2014-05-02 DIAGNOSIS — Z3202 Encounter for pregnancy test, result negative: Secondary | ICD-10-CM | POA: Insufficient documentation

## 2014-05-02 DIAGNOSIS — R109 Unspecified abdominal pain: Secondary | ICD-10-CM | POA: Insufficient documentation

## 2014-05-02 LAB — URINALYSIS, ROUTINE W REFLEX MICROSCOPIC
Bilirubin Urine: NEGATIVE
GLUCOSE, UA: NEGATIVE mg/dL
Hgb urine dipstick: NEGATIVE
Ketones, ur: NEGATIVE mg/dL
LEUKOCYTES UA: NEGATIVE
Nitrite: NEGATIVE
PROTEIN: NEGATIVE mg/dL
SPECIFIC GRAVITY, URINE: 1.01 (ref 1.005–1.030)
Urobilinogen, UA: 0.2 mg/dL (ref 0.0–1.0)
pH: 5.5 (ref 5.0–8.0)

## 2014-05-02 LAB — POCT PREGNANCY, URINE: PREG TEST UR: NEGATIVE

## 2014-05-02 NOTE — Discharge Instructions (Signed)
Prenatal Care  WHAT IS PRENATAL CARE?  Prenatal care means health care during your pregnancy, before your baby is born. It is very important to take care of yourself and your baby during your pregnancy by:   Getting early prenatal care. If you know you are pregnant, or think you might be pregnant, call your health care provider as soon as possible. Schedule a visit for a prenatal exam.  Getting regular prenatal care. Follow your health care provider's schedule for blood and other necessary tests. Do not miss appointments.  Doing everything you can to keep yourself and your baby healthy during your pregnancy.  Getting complete care. Prenatal care should include evaluation of the medical, dietary, educational, psychological, and social needs of you and your significant other. The medical and genetic history of your family and the family of your baby's father should be discussed with your health care provider.  Discussing with your health care provider:  Prescription, over-the-counter, and herbal medicines that you take.  Any history of substance abuse, alcohol use, smoking, and illegal drug use.  Any history of domestic abuse and violence.  Immunizations you have received.  Your nutrition and diet.  The amount of exercise you do.  Any environmental and occupational hazards to which you are exposed.  History of sexually transmitted infections for both you and your partner.  Previous pregnancies you have had. WHY IS PRENATAL CARE SO IMPORTANT?  By regularly seeing your health care provider, you help ensure that problems can be identified early so that they can be treated as soon as possible. Other problems might be prevented. Many studies have shown that early and regular prenatal care is important for the health of mothers and their babies.  HOW CAN I TAKE CARE OF MYSELF WHILE I AM PREGNANT?  Here are ways to take care of yourself and your baby:   Start or continue taking your  multivitamin with 400 micrograms (mcg) of folic acid every day.  Get early and regular prenatal care. It is very important to see a health care provider during your pregnancy. Your health care provider will check at each visit to make sure that you and your baby are healthy. If there are any problems, action can be taken right away to help you and your baby.  Eat a healthy diet that includes:  Fruits.  Vegetables.  Foods low in saturated fat.  Whole grains.  Calcium-rich foods, such as milk, yogurt, and hard cheeses.  Drink 6-8 glasses of liquids a day.  Unless your health care provider tells you not to, try to be physically active for 30 minutes, most days of the week. If you are pressed for time, you can get your activity in through 10-minute segments, three times a day.  Do not smoke, drink alcohol, or use drugs. These can cause long-term damage to your baby. Talk with your health care provider about steps to take to stop smoking. Talk with a member of your faith community, a counselor, a trusted friend, or your health care provider if you are concerned about your alcohol or drug use.  Ask your health care provider before taking any medicine, even over-the-counter medicines. Some medicines are not safe to take during pregnancy.  Get plenty of rest and sleep.  Avoid hot tubs and saunas during pregnancy.  Do not have X-rays taken unless absolutely necessary and with the recommendation of your health care provider. A lead shield can be placed on your abdomen to protect your baby when  X-rays are taken in other parts of your body.  Do not empty the cat litter when you are pregnant. It may contain a parasite that causes an infection called toxoplasmosis, which can cause birth defects. Also, use gloves when working in garden areas used by cats.  Do not eat uncooked or undercooked meats or fish.  Do not eat soft, mold-ripened cheeses (Brie, Camembert, and chevre) or soft, blue-veined  cheese (Danish blue and Roquefort).  Stay away from toxic chemicals like:  Insecticides.  Solvents (some cleaners or paint thinners).  Lead.  Mercury.  Sexual intercourse may continue until the end of the pregnancy, unless you have a medical problem or there is a problem with the pregnancy and your health care provider tells you not to.  Do not wear high-heel shoes, especially during the second half of the pregnancy. You can lose your balance and fall.  Do not take long trips, unless absolutely necessary. Be sure to see your health care provider before going on the trip.  Do not sit in one position for more than 2 hours when on a trip.  Take a copy of your medical records when going on a trip. Know where a hospital is located in the city you are visiting, in case of an emergency.  Most dangerous household products will have pregnancy warnings on their labels. Ask your health care provider about products if you are unsure.  Limit or eliminate your caffeine intake from coffee, tea, sodas, medicines, and chocolate.  Many women continue working through pregnancy. Staying active might help you stay healthier. If you have a question about the safety or the hours you work at your particular job, talk with your health care provider.  Get informed:  Read books.  Watch videos.  Go to childbirth classes for you and your significant other.  Talk with experienced moms.  Ask your health care provider about childbirth education classes for you and your partner. Classes can help you and your partner prepare for the birth of your baby.  Ask about a baby doctor (pediatrician) and methods and pain medicine for labor, delivery, and possible cesarean delivery. HOW OFTEN SHOULD I SEE MY HEALTH CARE PROVIDER DURING PREGNANCY?  Your health care provider will give you a schedule for your prenatal visits. You will have visits more often as you get closer to the end of your pregnancy. An average  pregnancy lasts about 40 weeks.  A typical schedule includes visiting your health care provider:   About once each month during your first 6 months of pregnancy.  Every 2 weeks during the next 2 months.  Weekly in the last month, until the delivery date. Your health care provider will probably want to see you more often if:  You are older than 35 years.  Your pregnancy is high risk because you have certain health problems or problems with the pregnancy, such as:  Diabetes.  High blood pressure.  The baby is not growing on schedule, according to the dates of the pregnancy. Your health care provider will do special tests to make sure you and your baby are not having any serious problems. WHAT HAPPENS DURING PRENATAL VISITS?   At your first prenatal visit, your health care provider will do a physical exam and talk to you about your health history and the health history of your partner and your family. Your health care provider will be able to tell you what date to expect your baby to be born on.  Your  first physical exam will include checks of your blood pressure, measurements of your height and weight, and an exam of your pelvic organs. Your health care provider will do a Pap test if you have not had one recently and will do cultures of your cervix to make sure there is no infection.  At each prenatal visit, there will be tests of your blood, urine, blood pressure, weight, and the progress of the baby will be checked.  At your later prenatal visits, your health care provider will check how you are doing and how your baby is developing. You may have a number of tests done as your pregnancy progresses.  Ultrasound exams are often used to check on your baby's growth and health.  You may have more urine and blood tests, as well as special tests, if needed. These may include amniocentesis to examine fluid in the pregnancy sac, stress tests to check how the baby responds to contractions, or a  biophysical profile to measure your baby's well-being. Your health care provider will explain the tests and why they are necessary.  You should be tested for high blood sugar (gestational diabetes) between the 24th and 28th weeks of your pregnancy.  You should discuss with your health care provider your plans to breastfeed or bottle-feed your baby.  Each visit is also a chance for you to learn about staying healthy during pregnancy and to ask questions. Document Released: 07/09/2003 Document Revised: 07/11/2013 Document Reviewed: 09/20/2013 [facility] Patient Information 2015 [place], [place]. This information is not intended to replace advice given to you by your health care provider. Make sure you discuss any questions you have with your health care provider.  Pregnancy Tests HOW DO PREGNANCY TESTS WORK? All pregnancy tests look for a special hormone in the urine or blood that is only present in pregnant women. This hormone, human chorionic gonadotropin (hCG), is also called the pregnancy hormone.  WHAT IS THE DIFFERENCE BETWEEN A URINE AND A BLOOD PREGNANCY TEST? IS ONE BETTER THAN THE OTHER? There are two types of pregnancy tests.  Blood tests.  Urine tests. Both tests look for the presence of hCG, the pregnancy hormone. Many women use a urine test or home pregnancy test (HPT) to find out if they are pregnant. HPTs are cheap, easy to use, can be done at home, and are private. When a woman has a positive result on an HPT, she needs to see her caregiver right away. The caregiver can confirm a positive HPT result with another urine test, a blood test, ultrasound, and a pelvic exam.  There are two types of blood tests you can get from a caregiver.   A quantitative blood test (or the beta hCG test). This test measures the exact amount of hCG in the blood. This means it can pick up very small amounts of hCG, making it a very accurate test.  A qualitative hCG blood test. This test gives a simple  yes or no answer to whether you are pregnant. This test is more like a urine test in terms of its accuracy. Blood tests can pick up hCG earlier in a pregnancy than urine tests can. Blood tests can tell if you are pregnant about 6 to 8 days after you release an egg from an ovary (ovulate). Urine tests can determine pregnancy about 2 weeks after ovulation.  HOW IS A HOME PREGNANCY TEST DONE?  There are many types of home pregnancy tests or HPTs that can be bought over-the-counter at drug or  discount stores.   Some involve collecting your urine in a cup and dipping a stick into the urine or putting some of the urine into a special container with an eyedropper.  Others are done by placing a stick into your urine stream.  Tests vary in how long you need to wait for the stick or container to turn a certain color or have a symbol on it (like a plus or a minus).  All tests come with written instructions. Most tests also have toll-free phone numbers to call if you have any questions about how to do the test or read the results. HOW ACCURATE ARE HOME PREGNANCY TESTS?  HPTs are very accurate. Most brands of HPTs say they are 97% to 99% accurate when taken 1 week after missing your menstrual period, but this can vary with actual use. Each brand varies in how sensitive it is in picking up the pregnancy hormone hCG. If a test is not done correctly, it will be less accurate. Always check the package to make sure it is not past its expiration date. If it is, it will not be accurate. Most brands of HPTs tell users to do the test again in a few days, no matter what the results.  If you use an HPT too early in your pregnancy, you may not have enough of the pregnancy hormone hCG in your urine to have a positive test result. Most HPTs will be accurate if you test yourself around the time your period is due (about 2 weeks after you ovulate). You can get a negative test result if you are not pregnant or if you ovulated later  than you thought you did. You may also have problems with the pregnancy, which affects the amount of hCG you have in your urine. If your HPT is negative, test yourself again within a few days to 1 week. If you keep getting a negative result and think you are pregnant, talk with your caregiver right away about getting a blood pregnancy test.  FALSE POSITIVE PREGNANCY TEST A false positive HPT can happen if there is blood or protein present in your urine. A false positive can also happen if you were recently pregnant or if you take a pregnancy test too soon after taking fertility drug that contains hCG. Also, some prescription medicines such as water pills (diuretics), tranquilizers, seizure medicines, psychiatric medicines, and allergy and nausea medicines (promethazine) give false positive readings. FALSE NEGATIVE PREGNANCY TEST  A false negative HPT can happen if you do the test too early. Try to wait until you are at least 1 day late for your menstrual period.  It may happen if you wait too long to test the urine (longer than 15 minutes).  It may also happen if the urine is too diluted because you drank a lot of fluids before getting the urine sample. It is best to test the first morning urine after you get out of bed. If your menstrual period did not start after a week of a negative HPT, repeat the pregnancy test. CAN ANYTHING INTERFERE WITH HOME PREGNANCY TEST RESULTS?  Most medicines, both over-the-counter and prescription drugs, including birth control pills and antibiotics, should not affect the results of a HPT. Only those drugs that have the pregnancy hormone hCG in them can give a false positive test result. Drugs that have hCG in them may be used for treating infertility (not being able to get pregnant). Alcohol and illegal drugs do not affect HPT results,  but you should not be using these substances if you are trying to get pregnant. If you have a positive pregnancy test, call your caregiver  to make an appointment to begin prenatal care. Document Released: 07/09/2003 Document Revised: 09/28/2011 Document Reviewed: 10/20/2013 [facility] Patient Information 2015 [place], [place]. This information is not intended to replace advice given to you by your health care provider. Make sure you discuss any questions you have with your health care provider.

## 2014-05-02 NOTE — MAU Note (Addendum)
Pt states she has had a cardiac arrest 6wks ago. Pt states she has been having a lot of bleeding from her"back passage" pt states that she has been having some brown discharge from her front passage. Pt states she is having crippling pain in her pelvic area.  Pt states she has a seizure about 3 hours ago.

## 2014-05-02 NOTE — MAU Provider Note (Signed)
History     CSN: [id number]  Arrival date and time: 05/02/14 2112   None     Chief Complaint  Patient presents with  . Abdominal Pain  . Vaginal Bleeding   HPI Comments: ERNESTA TRABERT 38 y.o. [id number] presents to MAU via EMS for miscarriage. She has history of multiple ED visits and alcohol and drug abuse.   Abdominal Pain  Vaginal Bleeding Associated symptoms include abdominal pain.      Past Medical History  Diagnosis Date  . Proctitis   . Cysts of both ovaries   . Seizures   . Anemia   . Anxiety   . Blood transfusion without reported diagnosis   . Depression   . Fatty liver 10/05/13  . Cardiac arrest     Past Surgical History  Procedure Laterality Date  . Ovarian cyst removal    . Laparoscopy N/A 09/28/2013    Procedure: LAPAROSCOPY OPERATIVE;  Surgeon: Terrance Mass, MD;  Location: [facility];  Service: Gynecology;  Laterality: N/A;  . Laparoscopic appendectomy Right 09/28/2013    Procedure: APPENDECTOMY LAPAROSCOPIC;  Surgeon: Terrance Mass, MD;  Location: [facility];  Service: Gynecology;  Laterality: Right;  . Salpingoophorectomy Right 09/28/2013    Procedure: SALPINGO OOPHORECTOMY;  Surgeon: Terrance Mass, MD;  Location: [facility];  Service: Gynecology;  Laterality: Right;  . Colonoscopy N/A 09/30/2013    Procedure: COLONOSCOPY;  Surgeon: Lafayette Dragon, MD;  Location: [facility];  Service: Endoscopy;  Laterality: N/A;  . Esophagogastroduodenoscopy N/A 11/23/2013    Procedure: ESOPHAGOGASTRODUODENOSCOPY (EGD);  Surgeon: Jerene Bears, MD;  Location: [facility];  Service: Endoscopy;  Laterality: N/A;  . Appendectomy      Family History  Problem Relation Age of Onset  . Diabetes Mother   . Hyperlipidemia Mother   . Stroke Mother   . Diabetes Father     History  Substance Use Topics  . Smoking status: Never Smoker   . Smokeless tobacco: Never Used  . Alcohol Use: Yes    Allergies:  Allergies  Allergen Reactions  . Morphine And Related Anaphylaxis      Tolerated hydromorphone on 11/25/13.   . Tramadol Other (See Comments)    Seizures   . Penicillins Other (See Comments)    Unknown childhood reaction.    Prescriptions prior to admission  Medication Sig Dispense Refill  . carvedilol (COREG) 3.125 MG tablet Take 1.5625 mg by mouth 2 (two) times daily with a meal.       . FLUoxetine (PROZAC) 20 MG capsule Take 40 mg by mouth daily.       . folic acid (FOLVITE) 1 MG tablet Take 1 tablet (1 mg total) by mouth daily.      Marland Kitchen HYDROcodone-acetaminophen (NORCO/VICODIN) 5-325 MG per tablet Take 1 tablet by mouth every 6 (six) hours as needed for moderate pain (pain).       Marland Kitchen ibuprofen (ADVIL,MOTRIN) 100 MG tablet Take 200 mg by mouth every 6 (six) hours as needed for pain or fever (pain).      Marland Kitchen levETIRAcetam (KEPPRA) 1000 MG tablet Take 1,000 mg by mouth daily.       . potassium chloride SA (K-DUR,KLOR-CON) 20 MEQ tablet Take 1 tablet (20 mEq total) by mouth daily.  3 tablet  0  . traZODone (DESYREL) 100 MG tablet Take 100 mg by mouth at bedtime as needed for sleep (sleep).         Review of Systems  Constitutional: Negative.  HENT: Negative.   Eyes: Negative.   Respiratory: Negative.   Cardiovascular: Negative.   Gastrointestinal: Positive for abdominal pain.  Genitourinary: Negative.   Musculoskeletal: Negative.   Skin: Negative.   Neurological: Negative.   Psychiatric/Behavioral: Negative.    Physical Exam   Blood pressure 130/89, pulse 84, temperature 97.4 F (36.3 C), temperature source Oral, resp. rate 18, last menstrual period 03/21/2014, SpO2 97.00%.  Physical Exam  Constitutional: She is oriented to person, place, and time. She appears well-developed and well-nourished. No distress.  HENT:  Head: Normocephalic and atraumatic.  Eyes: Pupils are equal, round, and reactive to light.  Genitourinary:  Pt refuses exam  Neurological: She is alert and oriented to person, place, and time.  Skin: Skin is warm and dry.   Psychiatric:  Patients changed accent changed from [place] to [place]    Results for orders placed during the hospital encounter of 05/02/14 (from the past 24 hour(s))  URINALYSIS, ROUTINE W REFLEX MICROSCOPIC     Status: None   Collection Time    05/02/14  9:30 PM      Result Value Ref Range   Color, Urine YELLOW  YELLOW   APPearance CLEAR  CLEAR   Specific Gravity, Urine 1.010  1.005 - 1.030   pH 5.5  5.0 - 8.0   Glucose, UA NEGATIVE  NEGATIVE mg/dL   Hgb urine dipstick NEGATIVE  NEGATIVE   Bilirubin Urine NEGATIVE  NEGATIVE   Ketones, ur NEGATIVE  NEGATIVE mg/dL   Protein, ur NEGATIVE  NEGATIVE mg/dL   Urobilinogen, UA 0.2  0.0 - 1.0 mg/dL   Nitrite NEGATIVE  NEGATIVE   Leukocytes, UA NEGATIVE  NEGATIVE  POCT PREGNANCY, URINE     Status: None   Collection Time    05/02/14  9:40 PM      Result Value Ref Range   Preg Test, Ur NEGATIVE  NEGATIVE     MAU Course  Procedures  MDM   Assessment and Plan   A:  Encounter for Pregnancy Test  P; Referred back to PCP  Shaunee Mulkern, Milas Kocher 05/02/2014, 9:48 PM

## 2014-05-03 NOTE — MAU Provider Note (Signed)
Attestation of Attending Supervision of Advanced Practitioner: Evaluation and management procedures were performed by the PA/NP/CNM/OB Fellow under my supervision/collaboration. Chart reviewed and agree with management and plan.  Brandonlee Navis V 05/03/2014 11:06 AM

## 2014-05-07 ENCOUNTER — Encounter (HOSPITAL_COMMUNITY): Payer: Self-pay | Admitting: Emergency Medicine

## 2014-05-07 ENCOUNTER — Inpatient Hospital Stay (HOSPITAL_COMMUNITY)
Admission: EM | Admit: 2014-05-07 | Discharge: 2014-05-10 | DRG: 896 | Disposition: A | Discharge status: Home / Self Care | Payer: Self-pay | Attending: Internal Medicine | Admitting: Internal Medicine

## 2014-05-07 ENCOUNTER — Inpatient Hospital Stay (HOSPITAL_COMMUNITY): Payer: Self-pay

## 2014-05-07 DIAGNOSIS — D696 Thrombocytopenia, unspecified: Secondary | ICD-10-CM | POA: Diagnosis present

## 2014-05-07 DIAGNOSIS — I252 Old myocardial infarction: Secondary | ICD-10-CM

## 2014-05-07 DIAGNOSIS — K6289 Other specified diseases of anus and rectum: Secondary | ICD-10-CM

## 2014-05-07 DIAGNOSIS — F1994 Other psychoactive substance use, unspecified with psychoactive substance-induced mood disorder: Secondary | ICD-10-CM

## 2014-05-07 DIAGNOSIS — I469 Cardiac arrest, cause unspecified: Secondary | ICD-10-CM

## 2014-05-07 DIAGNOSIS — I5021 Acute systolic (congestive) heart failure: Secondary | ICD-10-CM

## 2014-05-07 DIAGNOSIS — K226 Gastro-esophageal laceration-hemorrhage syndrome: Secondary | ICD-10-CM

## 2014-05-07 DIAGNOSIS — E538 Deficiency of other specified B group vitamins: Secondary | ICD-10-CM

## 2014-05-07 DIAGNOSIS — F10239 Alcohol dependence with withdrawal, unspecified: Principal | ICD-10-CM | POA: Diagnosis present

## 2014-05-07 DIAGNOSIS — F32A Depression, unspecified: Secondary | ICD-10-CM

## 2014-05-07 DIAGNOSIS — Z79899 Other long term (current) drug therapy: Secondary | ICD-10-CM

## 2014-05-07 DIAGNOSIS — F10930 Alcohol use, unspecified with withdrawal, uncomplicated: Secondary | ICD-10-CM

## 2014-05-07 DIAGNOSIS — F05 Delirium due to known physiological condition: Secondary | ICD-10-CM

## 2014-05-07 DIAGNOSIS — Y900 Blood alcohol level of less than 20 mg/100 ml: Secondary | ICD-10-CM | POA: Diagnosis present

## 2014-05-07 DIAGNOSIS — F419 Anxiety disorder, unspecified: Secondary | ICD-10-CM | POA: Diagnosis present

## 2014-05-07 DIAGNOSIS — D72819 Decreased white blood cell count, unspecified: Secondary | ICD-10-CM | POA: Diagnosis present

## 2014-05-07 DIAGNOSIS — R1110 Vomiting, unspecified: Secondary | ICD-10-CM

## 2014-05-07 DIAGNOSIS — Z6822 Body mass index (BMI) 22.0-22.9, adult: Secondary | ICD-10-CM

## 2014-05-07 DIAGNOSIS — E440 Moderate protein-calorie malnutrition: Secondary | ICD-10-CM

## 2014-05-07 DIAGNOSIS — J9601 Acute respiratory failure with hypoxia: Secondary | ICD-10-CM

## 2014-05-07 DIAGNOSIS — Z8742 Personal history of other diseases of the female genital tract: Secondary | ICD-10-CM

## 2014-05-07 DIAGNOSIS — I4901 Ventricular fibrillation: Secondary | ICD-10-CM

## 2014-05-07 DIAGNOSIS — N922 Excessive menstruation at puberty: Secondary | ICD-10-CM

## 2014-05-07 DIAGNOSIS — F329 Major depressive disorder, single episode, unspecified: Secondary | ICD-10-CM

## 2014-05-07 DIAGNOSIS — K921 Melena: Secondary | ICD-10-CM

## 2014-05-07 DIAGNOSIS — R569 Unspecified convulsions: Secondary | ICD-10-CM

## 2014-05-07 DIAGNOSIS — G621 Alcoholic polyneuropathy: Secondary | ICD-10-CM | POA: Diagnosis present

## 2014-05-07 DIAGNOSIS — K760 Fatty (change of) liver, not elsewhere classified: Secondary | ICD-10-CM

## 2014-05-07 DIAGNOSIS — E876 Hypokalemia: Secondary | ICD-10-CM | POA: Diagnosis present

## 2014-05-07 DIAGNOSIS — F10939 Alcohol use, unspecified with withdrawal, unspecified: Secondary | ICD-10-CM | POA: Diagnosis present

## 2014-05-07 DIAGNOSIS — N946 Dysmenorrhea, unspecified: Secondary | ICD-10-CM

## 2014-05-07 DIAGNOSIS — F4321 Adjustment disorder with depressed mood: Secondary | ICD-10-CM

## 2014-05-07 DIAGNOSIS — Z9889 Other specified postprocedural states: Secondary | ICD-10-CM

## 2014-05-07 DIAGNOSIS — D62 Acute posthemorrhagic anemia: Secondary | ICD-10-CM | POA: Diagnosis present

## 2014-05-07 DIAGNOSIS — Z9119 Patient's noncompliance with other medical treatment and regimen: Secondary | ICD-10-CM | POA: Diagnosis present

## 2014-05-07 DIAGNOSIS — F1020 Alcohol dependence, uncomplicated: Secondary | ICD-10-CM | POA: Diagnosis present

## 2014-05-07 DIAGNOSIS — E871 Hypo-osmolality and hyponatremia: Secondary | ICD-10-CM | POA: Diagnosis present

## 2014-05-07 DIAGNOSIS — R531 Weakness: Secondary | ICD-10-CM

## 2014-05-07 DIAGNOSIS — F10129 Alcohol abuse with intoxication, unspecified: Secondary | ICD-10-CM

## 2014-05-07 DIAGNOSIS — K920 Hematemesis: Secondary | ICD-10-CM

## 2014-05-07 DIAGNOSIS — R102 Pelvic and perineal pain: Secondary | ICD-10-CM

## 2014-05-07 DIAGNOSIS — F1910 Other psychoactive substance abuse, uncomplicated: Secondary | ICD-10-CM

## 2014-05-07 DIAGNOSIS — E43 Unspecified severe protein-calorie malnutrition: Secondary | ICD-10-CM | POA: Diagnosis present

## 2014-05-07 DIAGNOSIS — D649 Anemia, unspecified: Secondary | ICD-10-CM

## 2014-05-07 DIAGNOSIS — D638 Anemia in other chronic diseases classified elsewhere: Secondary | ICD-10-CM | POA: Diagnosis present

## 2014-05-07 DIAGNOSIS — I10 Essential (primary) hypertension: Secondary | ICD-10-CM | POA: Diagnosis present

## 2014-05-07 DIAGNOSIS — F10230 Alcohol dependence with withdrawal, uncomplicated: Secondary | ICD-10-CM

## 2014-05-07 DIAGNOSIS — G9340 Encephalopathy, unspecified: Secondary | ICD-10-CM | POA: Diagnosis present

## 2014-05-07 LAB — CBC WITH DIFFERENTIAL/PLATELET
BASOS ABS: 0 10*3/uL (ref 0.0–0.1)
Basophils Relative: 1 % (ref 0–1)
Eosinophils Absolute: 0 10*3/uL (ref 0.0–0.7)
Eosinophils Relative: 0 % (ref 0–5)
HCT: 28.2 % — ABNORMAL LOW (ref 36.0–46.0)
HEMOGLOBIN: 9.1 g/dL — AB (ref 12.0–15.0)
Lymphocytes Relative: 18 % (ref 12–46)
Lymphs Abs: 0.7 10*3/uL (ref 0.7–4.0)
MCH: 29.3 pg (ref 26.0–34.0)
MCHC: 32.3 g/dL (ref 30.0–36.0)
MCV: 90.7 fL (ref 78.0–100.0)
MONOS PCT: 5 % (ref 3–12)
Monocytes Absolute: 0.2 10*3/uL (ref 0.1–1.0)
NEUTROS ABS: 3.2 10*3/uL (ref 1.7–7.7)
NEUTROS PCT: 76 % (ref 43–77)
Platelets: 57 10*3/uL — ABNORMAL LOW (ref 150–400)
RBC: 3.11 MIL/uL — ABNORMAL LOW (ref 3.87–5.11)
RDW: 19.5 % — AB (ref 11.5–15.5)
WBC: 4.1 10*3/uL (ref 4.0–10.5)

## 2014-05-07 LAB — BASIC METABOLIC PANEL
Anion gap: 15 (ref 5–15)
BUN: 9 mg/dL (ref 6–23)
CHLORIDE: 93 meq/L — AB (ref 96–112)
CO2: 28 mEq/L (ref 19–32)
Calcium: 9.4 mg/dL (ref 8.4–10.5)
Creatinine, Ser: 0.46 mg/dL — ABNORMAL LOW (ref 0.50–1.10)
GFR calc Af Amer: >90 mL/min (ref >90)
GFR calc non Af Amer: >90 mL/min (ref >90)
Glucose, Bld: 92 mg/dL (ref 70–99)
POTASSIUM: 2.6 meq/L — AB (ref 3.7–5.3)
Sodium: 136 mEq/L — ABNORMAL LOW (ref 137–147)

## 2014-05-07 LAB — ETHANOL: Alcohol, Ethyl (B): 18 mg/dL — ABNORMAL HIGH (ref 0–11)

## 2014-05-07 LAB — MAGNESIUM: Magnesium: 1.8 mg/dL (ref 1.5–2.5)

## 2014-05-07 MED ORDER — LORAZEPAM 2 MG/ML IJ SOLN
1.0000 mg | INTRAMUSCULAR | Status: DC | PRN
  Administered 2014-05-08: 1 mg via INTRAVENOUS
  Filled 2014-05-07: qty 1

## 2014-05-07 MED ORDER — POTASSIUM CHLORIDE CRYS ER 20 MEQ PO TBCR: 20.0000 meq | EXTENDED_RELEASE_TABLET | Freq: Every day | ORAL | Status: DC

## 2014-05-07 MED ORDER — ONDANSETRON HCL 4 MG/2ML IJ SOLN
4.0000 mg | Freq: Four times a day (QID) | INTRAMUSCULAR | Status: DC | PRN
  Administered 2014-05-08 – 2014-05-09 (×2): 4 mg via INTRAVENOUS
  Filled 2014-05-07 (×2): qty 2

## 2014-05-07 MED ORDER — SODIUM CHLORIDE 0.9 % IV SOLN
INTRAVENOUS | Status: DC
  Administered 2014-05-07 – 2014-05-10 (×2): via INTRAVENOUS

## 2014-05-07 MED ORDER — POTASSIUM CHLORIDE CRYS ER 20 MEQ PO TBCR
40.0000 meq | EXTENDED_RELEASE_TABLET | Freq: Once | ORAL | Status: DC
  Filled 2014-05-07: qty 2

## 2014-05-07 MED ORDER — LEVETIRACETAM 500 MG PO TABS
1000.0000 mg | ORAL_TABLET | Freq: Every day | ORAL | Status: DC
  Administered 2014-05-08 – 2014-05-10 (×3): 1000 mg via ORAL
  Filled 2014-05-07 (×3): qty 2

## 2014-05-07 MED ORDER — POTASSIUM CHLORIDE 10 MEQ/100ML IV SOLN
10.0000 meq | Freq: Once | INTRAVENOUS | Status: AC
  Administered 2014-05-07: 10 meq via INTRAVENOUS
  Filled 2014-05-07: qty 100

## 2014-05-07 MED ORDER — LORAZEPAM 2 MG/ML IJ SOLN
2.0000 mg | Freq: Once | INTRAMUSCULAR | Status: AC
  Administered 2014-05-07: 2 mg via INTRAVENOUS
  Filled 2014-05-07: qty 1

## 2014-05-07 MED ORDER — PANTOPRAZOLE SODIUM 40 MG IV SOLR
40.0000 mg | Freq: Two times a day (BID) | INTRAVENOUS | Status: DC
  Administered 2014-05-07 – 2014-05-10 (×6): 40 mg via INTRAVENOUS
  Filled 2014-05-07 (×6): qty 40

## 2014-05-07 MED ORDER — FLUOXETINE HCL 20 MG PO CAPS
40.0000 mg | ORAL_CAPSULE | Freq: Every day | ORAL | Status: DC
  Administered 2014-05-08 – 2014-05-10 (×3): 40 mg via ORAL
  Filled 2014-05-07 (×4): qty 2

## 2014-05-07 MED ORDER — LEVETIRACETAM IN NACL 500 MG/100ML IV SOLN
500.0000 mg | Freq: Once | INTRAVENOUS | Status: AC
  Administered 2014-05-07: 500 mg via INTRAVENOUS
  Filled 2014-05-07: qty 100

## 2014-05-07 MED ORDER — POTASSIUM CHLORIDE 10 MEQ/100ML IV SOLN
10.0000 meq | INTRAVENOUS | Status: AC
  Administered 2014-05-08 (×4): 10 meq via INTRAVENOUS
  Filled 2014-05-07 (×4): qty 100

## 2014-05-07 MED ORDER — ONDANSETRON HCL 4 MG PO TABS: 4.0000 mg | ORAL_TABLET | Freq: Four times a day (QID) | ORAL | Status: DC | PRN

## 2014-05-07 MED ORDER — CARVEDILOL 3.125 MG PO TABS
1.5625 mg | ORAL_TABLET | Freq: Two times a day (BID) | ORAL | Status: DC
  Administered 2014-05-08 – 2014-05-10 (×5): 1.5625 mg via ORAL
  Filled 2014-05-07 (×5): qty 1

## 2014-05-07 MED ORDER — TRAZODONE HCL 50 MG PO TABS: 100.0000 mg | ORAL_TABLET | Freq: Every evening | ORAL | Status: DC | PRN

## 2014-05-07 MED ORDER — FOLIC ACID 1 MG PO TABS: 1.0000 mg | ORAL_TABLET | Freq: Every day | ORAL | Status: DC

## 2014-05-07 MED ORDER — HYDROCODONE-ACETAMINOPHEN 5-325 MG PO TABS
1.0000 | ORAL_TABLET | Freq: Four times a day (QID) | ORAL | Status: DC | PRN
  Administered 2014-05-08 – 2014-05-10 (×6): 1 via ORAL
  Filled 2014-05-07 (×6): qty 1

## 2014-05-07 NOTE — ED Notes (Signed)
PT states that she has had really bad muscle tremors all day; pt states that around 3:30pm she began to have tremors and fell backwards; pt states that she doesn't remember anything after that for a few seconds but friend reports that she had a several second "shaking" episode where she would not respond for a few seconds; pt states that she struck her head and her shoulder; pt c/o increase stress and anxiety since incident and is afraid she will have "another episode"

## 2014-05-07 NOTE — ED Notes (Signed)
EDP was made aware that pt threw up KCl tablets.

## 2014-05-07 NOTE — ED Notes (Signed)
Primary nurse Kathlene Cote and Zenia Resides EDP notified of critical K+ 2.6

## 2014-05-07 NOTE — H&P (Addendum)
[facility] History and Physical  Kristen Roberson QPY:[id number] DOB: 1976-02-05 DOA: 05/07/2014  Referring physician: ED physician PCP: Angelica Chessman, MD   Chief Complaint: seizures   HPI:  Pt is 38 yo female with history of seizures and using Keppra, alcohol abuse, medical non compliance, presented to [facility] ED with main concern of episode of seizure prior to the arrival to ED. Please note that pt is poor historian and does not offer much history but she did report she used drink heavily and has cut down significantly over the past week. Per significant other, pt fell and hit her head after the seizure, was post ictal and whole seizure lasted one minute. This was followed by vomiting and somnolence. No reported chest pain or shortness of breath, no headaches or visual changes. Pt reports resting tremors. No other abd or urinary concerns reported.   In ED, pt is hemodynamically stable, VSS except tachycardia, HR 110 - 120's, blood work notable for Hg 9.1 (last Hg ~11 2 weeks ago but at baseline ~ 9 - 10), plt 57, K 2.6. TRH asked to admit for further evaluation. SDU bed requested.   Assessment and Plan: Principal Problem:   Seizures - possibly secondary to alcohol withdrawal and medical non compliance with Keppra - admit to SDU, seizure precautions - keep on Keppra, add Ativan as needed - CIWA protocol monitoring  Active Problems:   N/V - post seizure, pt is on ibuprofen at home, denies any blood in emesis - antiemetics as needed - hold ibuprofen and place on PPI IV - CXR requested to ensure no aspiration    Acute blood loss anemia - drop in Hg since last known baseline 11, 2 weeks ago - per significant other, pt did have some bloody stools, pt has hx of hemorrhoids, FOBT requested  - pt at baseline has Hg 9 - 10 - will continue to monitor closely, repeat CBC in AM   Alcohol withdrawal - keep in SDU, CIWA protocol, Ativan as needed   Alcohol dependence - discuss cessation  once pt more medically stable    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  Code Status: Full Family Communication: Pt at bedside Disposition Plan: Admit for further evaluation     Review of Systems:  Constitutional: Negative for diaphoresis.  HENT: Negative for hearing loss, ear pain, nosebleeds, congestion, sore throat, neck pain, tinnitus and ear discharge.   Eyes: Negative for blurred vision, double vision, photophobia, pain, discharge and redness.  Respiratory: Negative for cshortness of breath, wheezing and stridor.   Cardiovascular: Negative for chest pain, palpitations, orthopnea, claudication and leg swelling.  Gastrointestinal: Per HPI Genitourinary: Negative for dysuria, urgency, frequency, hematuria and flank pain.  Musculoskeletal: Negative for myalgias, back pain, joint pain and falls.  Skin: Negative for itching and rash.  Neurological: Negative for dizziness and weakness. Endo/Heme/Allergies: Negative for environmental allergies and polydipsia. Does not bruise/bleed easily.  Psychiatric/Behavioral: Negative for suicidal ideas.     Past Medical History  Diagnosis Date  . Proctitis   . Cysts of both ovaries   . Anemia   . Anxiety   . Blood transfusion without reported diagnosis   . Depression   . Fatty liver  10/05/13  . Cardiac arrest   . Seizures     Past Surgical History  Procedure Laterality Date  . Ovarian cyst removal    . Laparoscopy N/A 09/28/2013    Procedure: LAPAROSCOPY OPERATIVE;  Surgeon: Terrance Mass, MD;  Location: [facility];  Service: Gynecology;  Laterality: N/A;  . Laparoscopic appendectomy Right 09/28/2013    Procedure: APPENDECTOMY LAPAROSCOPIC;  Surgeon: Terrance Mass, MD;  Location: [facility];  Service: Gynecology;  Laterality: Right;  . Salpingoophorectomy Right 09/28/2013    Procedure: SALPINGO OOPHORECTOMY;  Surgeon: Terrance Mass, MD;  Location: [facility];  Service: Gynecology;  Laterality: Right;  . Colonoscopy N/A 09/30/2013    Procedure: COLONOSCOPY;  Surgeon: Lafayette Dragon, MD;  Location: [facility];  Service: Endoscopy;  Laterality: N/A;  . Esophagogastroduodenoscopy N/A 11/23/2013    Procedure: ESOPHAGOGASTRODUODENOSCOPY (EGD);  Surgeon: Jerene Bears, MD;  Location: [facility];  Service: Endoscopy;  Laterality: N/A;  . Appendectomy      Social History:  reports that she has never smoked. She has never used smokeless tobacco. She reports that she drinks alcohol. She reports that she does not use illicit drugs.  Allergies  Allergen Reactions  . Morphine And Related Anaphylaxis     Tolerated hydromorphone on 11/25/13.   . Tramadol Other (See Comments)    Seizures   . Penicillins Other (See Comments)    Unknown childhood reaction.    Family History  Problem Relation Age of Onset  . Diabetes Mother   . Hyperlipidemia Mother   . Stroke Mother   . Diabetes Father     Prior to Admission medications   Medication Sig Start Date End Date Taking? Authorizing Provider  carvedilol (COREG) 3.125 MG tablet Take 1.5625 mg by mouth 2 (two) times daily with a meal.    Yes Historical Provider, MD  FLUoxetine (PROZAC) 20 MG capsule Take 40 mg by mouth daily.    Yes Historical Provider, MD  folic acid (FOLVITE) 1 MG tablet Take 1 tablet (1 mg total) by mouth daily. 04/17/14  Yes Eugenie Filler, MD  HYDROcodone-acetaminophen (NORCO/VICODIN) 5-325 MG per tablet Take 1 tablet by mouth every 6 (six) hours as needed for moderate pain (pain).    Yes Historical Provider, MD  ibuprofen (ADVIL,MOTRIN) 100 MG tablet Take 200 mg by mouth every 6 (six) hours as needed for pain or fever (pain).   Yes Historical Provider, MD  levETIRAcetam (KEPPRA) 1000 MG  tablet Take 1,000 mg by mouth daily.    Yes Historical Provider, MD  potassium chloride SA (K-DUR,KLOR-CON) 20 MEQ tablet Take 1 tablet (20 mEq total) by mouth daily. 04/28/14  Yes Robyn M Hess, PA-C  traZODone (DESYREL) 100 MG tablet Take 100 mg by mouth at bedtime as needed for sleep (sleep).    Yes Historical Provider, MD    Physical Exam: Filed Vitals:   05/07/14 2053  BP: 121/80  Pulse: 117  Temp: 98.3 F (36.8 C)  TempSrc: Oral  Resp: 18  SpO2: 100%    Physical Exam  Constitutional: Appears well-developed and well-nourished. No distress.  HENT: Normocephalic. External right and left ear normal. Dry MM Eyes: Conjunctivae and EOM are normal. PERRLA, no scleral icterus.  Neck: Normal ROM. Neck supple. No JVD. No tracheal deviation. No thyromegaly.  CVS: tachycardic, S1/S2 +, no murmurs, no gallops, no carotid bruit.  Pulmonary: Effort and breath sounds normal, no stridor,diminished breath sounds at bases .  Abdominal: Soft. BS +,  no distension, tenderness,  rebound or guarding.  Musculoskeletal: Normal range of motion. No edema and no tenderness.  Lymphadenopathy: No lymphadenopathy noted, cervical, inguinal. Neuro: Alert. Resting tremor noted, moving all 4 extremities spontaneously  Skin: Skin is warm and dry. No rash noted. Not diaphoretic. No erythema. No pallor.  Psychiatric: Flat affect   Labs on Admission:  Basic Metabolic Panel:  Recent Labs Lab 05/07/14 2123  NA 136*  K 2.6*  CL 93*  CO2 28  GLUCOSE 92  BUN 9  CREATININE 0.46*  CALCIUM 9.4  MG 1.8   CBC:  Recent Labs Lab 05/07/14 2123  WBC 4.1  NEUTROABS 3.2  HGB 9.1*  HCT 28.2*  MCV 90.7  PLT 57*    EKG: Normal sinus rhythm, no ST/T wave changes  Faye Ramsay, MD  [facility] Pager [phone]  If 7PM-7AM, please contact night-coverage [email or web address] Password [email or web address] 05/07/2014, 10:41 PM

## 2014-05-07 NOTE — ED Provider Notes (Signed)
CSN: [id number]     Arrival date & time 05/07/14  2023 History   First MD Initiated Contact with Patient 05/07/14 2155     Chief Complaint  Patient presents with  . Seizures  . Tremors     (Consider location/radiation/quality/duration/timing/severity/associated sxs/prior Treatment) HPI Comments: Patient here complaining of a seizure prior to arrival. Patient normally is a heavy drinker and has recently started to use less of her alcohol. She also has a seizure disorder for which she takes Keppra she has been noncompliant. According to her significant other, seizure lasted less than 1 minute and she was postictal period she did strike the back of her head. Denies any neck pain. She's also had vomiting and bloody stools. Labs were done before I saw the patient and shows a 3 g drop of hemoglobin. Patient does admit to a resting tremor. Denies any illicit drug use at this time. Denies having abdominal pain. Symptoms persisted and nothing makes them better and no treatment used prior to arrival  Patient is a 38 y.o. female presenting with seizures. The history is provided by the patient.  Seizures   Past Medical History  Diagnosis Date  . Proctitis   . Cysts of both ovaries   . Anemia   . Anxiety   . Blood transfusion without reported diagnosis   . Depression   . Fatty liver 10/05/13  . Cardiac arrest   . Seizures    Past Surgical History  Procedure Laterality Date  . Ovarian cyst removal    . Laparoscopy N/A 09/28/2013    Procedure: LAPAROSCOPY OPERATIVE;  Surgeon: Terrance Mass, MD;  Location: [facility];  Service: Gynecology;  Laterality: N/A;  . Laparoscopic appendectomy Right 09/28/2013    Procedure: APPENDECTOMY LAPAROSCOPIC;  Surgeon: Terrance Mass, MD;  Location: [facility];  Service: Gynecology;  Laterality: Right;  . Salpingoophorectomy Right 09/28/2013    Procedure: SALPINGO OOPHORECTOMY;  Surgeon: Terrance Mass, MD;  Location: [facility];  Service: Gynecology;  Laterality:  Right;  . Colonoscopy N/A 09/30/2013    Procedure: COLONOSCOPY;  Surgeon: Lafayette Dragon, MD;  Location: [facility];  Service: Endoscopy;  Laterality: N/A;  . Esophagogastroduodenoscopy N/A 11/23/2013    Procedure: ESOPHAGOGASTRODUODENOSCOPY (EGD);  Surgeon: Jerene Bears, MD;  Location: [facility];  Service: Endoscopy;  Laterality: N/A;  . Appendectomy     Family History  Problem Relation Age of Onset  . Diabetes Mother   . Hyperlipidemia Mother   . Stroke Mother   . Diabetes Father    History  Substance Use Topics  . Smoking status: Never Smoker   . Smokeless tobacco: Never Used  . Alcohol Use: Yes   OB History   Grav Para Term Preterm Abortions TAB SAB Ect Mult Living   7 3   4  4   3      Review of Systems  Neurological: Positive for seizures.  All other systems reviewed and are negative.     Allergies  Morphine and related; Tramadol; and Penicillins  Home Medications   Prior to Admission medications   Medication Sig Start Date End Date Taking? Authorizing Provider  carvedilol (COREG) 3.125 MG tablet Take 1.5625 mg by mouth 2 (two) times daily with a meal.    Yes Historical Provider, MD  FLUoxetine (PROZAC) 20 MG capsule Take 40 mg by mouth daily.    Yes Historical Provider, MD  folic acid (FOLVITE) 1 MG tablet Take 1 tablet (1 mg total) by mouth daily. 04/17/14  Yes Eugenie Filler, MD  HYDROcodone-acetaminophen (NORCO/VICODIN) 5-325 MG per tablet Take 1 tablet by mouth every 6 (six) hours as needed for moderate pain (pain).    Yes Historical Provider, MD  ibuprofen (ADVIL,MOTRIN) 100 MG tablet Take 200 mg by mouth every 6 (six) hours as needed for pain or fever (pain).   Yes Historical Provider, MD  levETIRAcetam (KEPPRA) 1000 MG tablet Take 1,000 mg by mouth daily.    Yes Historical Provider, MD  potassium chloride SA (K-DUR,KLOR-CON) 20 MEQ tablet Take 1 tablet (20 mEq total) by mouth daily. 04/28/14  Yes Robyn M Hess, PA-C  traZODone (DESYREL) 100 MG tablet Take  100 mg by mouth at bedtime as needed for sleep (sleep).    Yes Historical Provider, MD   BP 121/80  Pulse 117  Temp(Src) 98.3 F (36.8 C) (Oral)  Resp 18  SpO2 100%  LMP 03/26/2014 Physical Exam  Nursing note and vitals reviewed. Constitutional: She is oriented to person, place, and time. She appears well-developed and well-nourished.  Non-toxic appearance. No distress.  HENT:  Head: Normocephalic and atraumatic.  Eyes: Conjunctivae, EOM and lids are normal. Pupils are equal, round, and reactive to light.  Neck: Normal range of motion. Neck supple. No tracheal deviation present. No mass present.  Cardiovascular: Regular rhythm and normal heart sounds.  Tachycardia present.  Exam reveals no gallop.   No murmur heard. Pulmonary/Chest: Effort normal and breath sounds normal. No stridor. No respiratory distress. She has no decreased breath sounds. She has no wheezes. She has no rhonchi. She has no rales.  Abdominal: Soft. Normal appearance and bowel sounds are normal. She exhibits no distension. There is no tenderness. There is no rebound and no CVA tenderness.  Genitourinary: Rectal exam shows external hemorrhoid.  Musculoskeletal: Normal range of motion. She exhibits no edema and no tenderness.  Neurological: She is alert and oriented to person, place, and time. She has normal strength. No cranial nerve deficit or sensory deficit. GCS eye subscore is 4. GCS verbal subscore is 5. GCS motor subscore is 6.  Right facial tick  Skin: Skin is warm and dry. No abrasion and no rash noted.  Psychiatric: Her behavior is normal. Her mood appears anxious. Her speech is rapid and/or pressured.    ED Course  Procedures (including critical care time) Labs Review Labs Reviewed  CBC WITH DIFFERENTIAL - Abnormal; Notable for the following:    RBC 3.11 (*)    Hemoglobin 9.1 (*)    HCT 28.2 (*)    RDW 19.5 (*)    Platelets 57 (*)    All other components within normal limits  BASIC METABOLIC PANEL -  Abnormal; Notable for the following:    Sodium 136 (*)    Potassium 2.6 (*)    Chloride 93 (*)    Creatinine, Ser 0.46 (*)    All other components within normal limits  ETHANOL - Abnormal; Notable for the following:    Alcohol, Ethyl (B) 18 (*)    All other components within normal limits  MAGNESIUM  URINE RAPID DRUG SCREEN (HOSP PERFORMED)  POC URINE PREG, ED  TYPE AND SCREEN    Imaging Review No results found.   EKG Interpretation None      MDM   Final diagnoses:  None   Date: 05/07/2014  Rate: 110  Rhythm: sinus tachycardia  QRS Axis: normal  Intervals: normal  ST/T Wave abnormalities: nonspecific ST changes  Conduction Disutrbances:none  Narrative Interpretation:   Old EKG Reviewed:  none available    Patient given IV fluids here along with Ativan. Patient's hypokalemia treated with oral and IV potassium. Patient will be given a half a gram of Keppra IVP dye. Suspect that her seizure was from a combination of alcohol withdrawal as well as noncompliance with her anti-epileptic medication. Will place patient on ciwa protocol and admitted to the medicine service    Leota Jacobsen, MD 05/07/14 2241

## 2014-05-08 DIAGNOSIS — F10129 Alcohol abuse with intoxication, unspecified: Secondary | ICD-10-CM

## 2014-05-08 LAB — POC URINE PREG, ED: Preg Test, Ur: NEGATIVE

## 2014-05-08 LAB — URINALYSIS, ROUTINE W REFLEX MICROSCOPIC
Bilirubin Urine: NEGATIVE
Glucose, UA: NEGATIVE mg/dL
Hgb urine dipstick: NEGATIVE
Ketones, ur: NEGATIVE mg/dL
LEUKOCYTES UA: NEGATIVE
Nitrite: NEGATIVE
PH: 7 (ref 5.0–8.0)
PROTEIN: NEGATIVE mg/dL
Specific Gravity, Urine: 1.024 (ref 1.005–1.030)
Urobilinogen, UA: 1 mg/dL (ref 0.0–1.0)

## 2014-05-08 LAB — COMPREHENSIVE METABOLIC PANEL
ALT: 19 U/L (ref 0–35)
ANION GAP: 11 (ref 5–15)
AST: 82 U/L — ABNORMAL HIGH (ref 0–37)
Albumin: 3.1 g/dL — ABNORMAL LOW (ref 3.5–5.2)
Alkaline Phosphatase: 101 U/L (ref 39–117)
BUN: 9 mg/dL (ref 6–23)
CO2: 25 mEq/L (ref 19–32)
Calcium: 7.6 mg/dL — ABNORMAL LOW (ref 8.4–10.5)
Chloride: 101 mEq/L (ref 96–112)
Creatinine, Ser: 0.49 mg/dL — ABNORMAL LOW (ref 0.50–1.10)
GFR calc non Af Amer: >90 mL/min (ref >90)
Glucose, Bld: 91 mg/dL (ref 70–99)
Potassium: 3.3 mEq/L — ABNORMAL LOW (ref 3.7–5.3)
Sodium: 137 mEq/L (ref 137–147)
TOTAL PROTEIN: 5.5 g/dL — AB (ref 6.0–8.3)
Total Bilirubin: 0.3 mg/dL (ref 0.3–1.2)

## 2014-05-08 LAB — CBC
HCT: 22.8 % — ABNORMAL LOW (ref 36.0–46.0)
HEMOGLOBIN: 7.4 g/dL — AB (ref 12.0–15.0)
MCH: 30 pg (ref 26.0–34.0)
MCHC: 32.5 g/dL (ref 30.0–36.0)
MCV: 92.3 fL (ref 78.0–100.0)
Platelets: 40 10*3/uL — ABNORMAL LOW (ref 150–400)
RBC: 2.47 MIL/uL — AB (ref 3.87–5.11)
RDW: 19.9 % — ABNORMAL HIGH (ref 11.5–15.5)
WBC: 2.8 10*3/uL — ABNORMAL LOW (ref 4.0–10.5)

## 2014-05-08 LAB — RAPID URINE DRUG SCREEN, HOSP PERFORMED
Amphetamines: NOT DETECTED
Barbiturates: NOT DETECTED
Benzodiazepines: NOT DETECTED
COCAINE: NOT DETECTED
OPIATES: NOT DETECTED
Tetrahydrocannabinol: NOT DETECTED

## 2014-05-08 LAB — PREPARE RBC (CROSSMATCH)

## 2014-05-08 MED ORDER — FOLIC ACID 1 MG PO TABS
1.0000 mg | ORAL_TABLET | Freq: Every day | ORAL | Status: DC
  Administered 2014-05-08 – 2014-05-10 (×3): 1 mg via ORAL
  Filled 2014-05-08 (×3): qty 1

## 2014-05-08 MED ORDER — ADULT MULTIVITAMIN W/MINERALS CH
1.0000 | ORAL_TABLET | Freq: Every day | ORAL | Status: DC
  Administered 2014-05-08 – 2014-05-10 (×3): 1 via ORAL
  Filled 2014-05-08 (×3): qty 1

## 2014-05-08 MED ORDER — FENTANYL CITRATE 0.05 MG/ML IJ SOLN
12.5000 ug | Freq: Once | INTRAMUSCULAR | Status: AC
  Administered 2014-05-08: 12.5 ug via INTRAVENOUS
  Filled 2014-05-08: qty 2

## 2014-05-08 MED ORDER — LORAZEPAM 2 MG/ML IJ SOLN
1.0000 mg | Freq: Four times a day (QID) | INTRAMUSCULAR | Status: DC | PRN
  Administered 2014-05-08 – 2014-05-09 (×4): 1 mg via INTRAVENOUS
  Filled 2014-05-08 (×4): qty 1

## 2014-05-08 MED ORDER — THIAMINE HCL 100 MG/ML IJ SOLN
100.0000 mg | Freq: Every day | INTRAMUSCULAR | Status: DC
  Filled 2014-05-08: qty 1

## 2014-05-08 MED ORDER — LORAZEPAM 1 MG PO TABS: 1.0000 mg | ORAL_TABLET | Freq: Four times a day (QID) | ORAL | Status: DC | PRN

## 2014-05-08 MED ORDER — VITAMIN B-1 100 MG PO TABS
100.0000 mg | ORAL_TABLET | Freq: Every day | ORAL | Status: DC
  Administered 2014-05-08 – 2014-05-10 (×3): 100 mg via ORAL
  Filled 2014-05-08 (×3): qty 1

## 2014-05-08 MED ORDER — SODIUM CHLORIDE 0.9 % IV SOLN
Freq: Once | INTRAVENOUS | Status: AC
Start: 2014-05-08 — End: 2014-05-08
  Administered 2014-05-08: 09:00:00 via INTRAVENOUS

## 2014-05-08 MED ORDER — KETOROLAC TROMETHAMINE 30 MG/ML IJ SOLN
30.0000 mg | Freq: Once | INTRAMUSCULAR | Status: AC
  Administered 2014-05-08: 30 mg via INTRAVENOUS
  Filled 2014-05-08: qty 1

## 2014-05-08 NOTE — Progress Notes (Signed)
Pt began having strong tremors and became anxious. Pt states she was sure if she did not receive ativan she would seize. 1 mg IV Ativan given. Pt encouraged to deep breathe and within 5 minutes tremors stopped and patient began to feel calm with only minimal anxiety. Will continue to monitor.

## 2014-05-08 NOTE — Progress Notes (Signed)
Pt was showing low saturations (mid 80s, RA) on the monitor with a good pleth. Pt was in deep sleep. 2 L O2 [place] was placed on the patient and saturations are now reading 96%. Pt sleeping again. Will continue to monitor as patient is on CIWA protocol.

## 2014-05-08 NOTE — Progress Notes (Signed)
CARE MANAGEMENT NOTE 05/08/2014  Patient:  Kristen Roberson, Kristen Roberson   Account Number:  [id number]  Date Initiated:  05/08/2014  Documentation initiated by:  DAVIS,RHONDA  Subjective/Objective Assessment:   patient with hx of seizure and prescribed keppra that she does not take as directed and drinks large volumes of etoh, per the husband had a seizure and then fell stricking her head     Action/Plan:   tbd home   Anticipated DC Date:  05/11/2014   Anticipated DC Plan:  HOME/SELF CARE  In-house referral  Clinical Social Worker      DC Planning Services  CM consult      [facility] Choice  NA   Choice offered to / List presented to:  NA   DME arranged  NA      DME agency  NA     [place] arranged  NA      [place] agency  NA   Status of service:  In process, will continue to follow Medicare Important Message given?   (If response is "NO", the following Medicare IM given date fields will be blank) Date Medicare IM given:   Medicare IM given by:   Date Additional Medicare IM given:   Additional Medicare IM given by:    Discharge Disposition:    Per UR Regulation:  Reviewed for med. necessity/level of care/duration of stay  If discussed at [place] of Stay Meetings, dates discussed:    Comments:  [id number]/Rhonda Davis,RN,BSn,CCM

## 2014-05-08 NOTE — Progress Notes (Addendum)
Patient ID: Kristen Roberson, female   DOB: Mar 22, 1976, 38 y.o.   MRN: [id number] [facility] PROGRESS NOTE  CLIFTON KOVACIC WIO:[id number] DOB: May 06, 1976 DOA: 05/07/2014 PCP: Angelica Chessman, MD  Brief narrative: 38 yo female with history of seizures and using Keppra, alcohol abuse, medical non compliance who presented to [facility] ED 05/07/2014  with main concern of episode of seizure prior to the arrival to ED. Patient did report she used to drink heavily and has cut down significantly over the past week.Per family report, patient has had a seizure episode and was post-ictal afterwards.  In ED, pt is hemodynamically stable, VSS except tachycardia, HR 110 - 120's, blood work notable for Hg 9.1 (last Hg ~11 2 weeks ago but at baseline ~ 9 - 10), plt 57, K 2.6. Alcohol level was 18 on admission. Normal UDS. CXR on admission showed no active disease.   Assessment/Plan:    Principal Problem:   Seizures secondary to acute alcohol intoxication and withdrawal  Patient has cut down drinking. Alcohol level still elevated at 18 on this admission but less compared to the values a week prior to this admission.  CIWA protocol ordered. Continue supportive care with IV fluids, multivitamin, thiamine and folic acid.  Continue keppra 1000 mg daily.  Monitor for seizures and withdrawals.   Replete electrolytes as needed.  Active Problems: Acute alcohol intoxication / Alcohol withdrawal  Alcohol level 18 on admission. Monitor for withdrawals.  Continue CIWA per SDU protocol.  Acute blood loss anemia / Anemia of chronic disease  Likely due to bone marrow suppression form alcohol abuse.   Hemoglobin dropped further to 7.4. Will place order for 1 unit PRBC transfusion.   Continue to monitor CBC.  Thrombocytopenia / leukopenia   Likely bone marrow suppression for alcohol abuse  Platelet count 57. No evidence of bleeding.  WBC count 2.8.   No current indications for transfusion.   Hypokalemia  Likely electrolyte depletion from alcohol abuse. Supplemented.  Magnesium was WNL.   DVT Prophylaxis   SCD;s bilaterally   Code Status: Full.  Family Communication:  plan of care discussed with the patient Disposition Plan: remains inpatient    IV Access:   Peripheral IV  Procedures and diagnostic studies:   Dg Chest Port 1 View 05/08/2014    No active disease.   Electronically Signed   By: Lucienne Capers M.D.   On: 05/08/2014 00:04   Medical Consultants:   None   Other Consultants:   None   Anti-Infectives:   None    Leisa Lenz, MD  [facility] Pager [phone]  If 7PM-7AM, please contact night-coverage [email or web address] Password [email or web address] 05/08/2014, 6:16 AM   LOS: 1 day    HPI/Subjective: No acute overnight events.  Objective: Filed Vitals:   05/08/14 0100 05/08/14 0130 05/08/14 0217 05/08/14 0400  BP: 105/72 128/83    Pulse: 86 95    Temp:    97.5 F (36.4 C)  TempSrc:    Oral  Resp: 22 20    Height:   5\' 6"  (1.676 m)   Weight:   61.2 kg (134 lb 14.7 oz)   SpO2: 99% 100%      Intake/Output Summary (Last 24 hours) at 05/08/14 0616 Last data filed at 05/08/14 0300  Gross per 24 hour  Intake  752.5 ml  Output      0 ml  Net  752.5 ml    Exam:   General:  Pt is not in acute distress  Cardiovascular:  Regular rate and rhythm, S1/S2, no murmurs  Respiratory: Clear to auscultation bilaterally, no wheezing, no crackles, no rhonchi  Abdomen: Soft, non tender, non distended, bowel sounds present  Extremities: pulses DP and PT palpable bilaterally  Neuro: Grossly nonfocal  Data Reviewed: Basic Metabolic Panel:  Recent Labs Lab 05/07/14 2123 05/08/14 0400  NA 136* 137  K 2.6* 3.3*  CL 93* 101  CO2 28 25  GLUCOSE 92 91  BUN 9 9  CREATININE 0.46* 0.49*  CALCIUM 9.4 7.6*  MG 1.8  --    Liver Function Tests:  Recent Labs Lab 05/08/14 0400  AST 82*  ALT 19  ALKPHOS 101  BILITOT 0.3  PROT 5.5*  ALBUMIN 3.1*    No results found for this basename: LIPASE, AMYLASE,  in the last 168 hours No results found for this basename: AMMONIA,  in the last 168 hours CBC:  Recent Labs Lab 05/07/14 2123 05/08/14 0400  WBC 4.1 2.8*  NEUTROABS 3.2  --   HGB 9.1* 7.4*  HCT 28.2* 22.8*  MCV 90.7 92.3  PLT 57* 40*   Cardiac Enzymes: No results found for this basename: CKTOTAL, CKMB, CKMBINDEX, TROPONINI,  in the last 168 hours BNP: No components found with this basename: POCBNP,  CBG: No results found for this basename: GLUCAP,  in the last 168 hours  No results found for this or any previous visit (from the past 240 hour(s)).   Scheduled Meds: . carvedilol  1.5625 mg Oral BID WC  . FLUoxetine  40 mg Oral Daily  . folic acid  1 mg Oral Daily  . levETIRAcetam  1,000 mg Oral Daily  . multivitamin with minerals  1 tablet Oral Daily  . pantoprazole (PROTONIX) IV  40 mg Intravenous Q12H  . thiamine  100 mg Oral Daily   Or  . thiamine  100 mg Intravenous Daily   Continuous Infusions: . sodium chloride 75 mL/hr at 05/07/14 2354

## 2014-05-09 LAB — BASIC METABOLIC PANEL
Anion gap: 15 (ref 5–15)
CHLORIDE: 103 meq/L (ref 96–112)
CO2: 24 mEq/L (ref 19–32)
Calcium: 8 mg/dL — ABNORMAL LOW (ref 8.4–10.5)
Creatinine, Ser: 0.45 mg/dL — ABNORMAL LOW (ref 0.50–1.10)
GFR calc Af Amer: >90 mL/min (ref >90)
GFR calc non Af Amer: >90 mL/min (ref >90)
Glucose, Bld: 94 mg/dL (ref 70–99)
POTASSIUM: 3.6 meq/L — AB (ref 3.7–5.3)
Sodium: 142 mEq/L (ref 137–147)

## 2014-05-09 LAB — CBC
HCT: 31 % — ABNORMAL LOW (ref 36.0–46.0)
HEMOGLOBIN: 10.2 g/dL — AB (ref 12.0–15.0)
MCH: 29.9 pg (ref 26.0–34.0)
MCHC: 32.9 g/dL (ref 30.0–36.0)
MCV: 90.9 fL (ref 78.0–100.0)
Platelets: 84 10*3/uL — ABNORMAL LOW (ref 150–400)
RBC: 3.41 MIL/uL — AB (ref 3.87–5.11)
RDW: 18.9 % — ABNORMAL HIGH (ref 11.5–15.5)
WBC: 1.9 10*3/uL — ABNORMAL LOW (ref 4.0–10.5)

## 2014-05-09 LAB — TYPE AND SCREEN
ABO/RH(D): O POS
Antibody Screen: NEGATIVE
UNIT DIVISION: 0

## 2014-05-09 LAB — URINE CULTURE: Colony Count: 8000

## 2014-05-09 MED ORDER — POTASSIUM CHLORIDE CRYS ER 20 MEQ PO TBCR
40.0000 meq | EXTENDED_RELEASE_TABLET | Freq: Once | ORAL | Status: AC
  Administered 2014-05-09: 40 meq via ORAL
  Filled 2014-05-09: qty 2

## 2014-05-09 MED ORDER — LORAZEPAM 1 MG PO TABS
1.0000 mg | ORAL_TABLET | ORAL | Status: DC | PRN
  Administered 2014-05-09 – 2014-05-10 (×6): 1 mg via ORAL
  Filled 2014-05-09 (×6): qty 1

## 2014-05-09 MED ORDER — BOOST / RESOURCE BREEZE PO LIQD
1.0000 | Freq: Two times a day (BID) | ORAL | Status: DC
  Administered 2014-05-09: 1 via ORAL

## 2014-05-09 MED ORDER — LORAZEPAM 2 MG/ML IJ SOLN
1.0000 mg | INTRAMUSCULAR | Status: DC | PRN
  Administered 2014-05-09: 1 mg via INTRAVENOUS
  Filled 2014-05-09: qty 1

## 2014-05-09 MED ORDER — FENTANYL CITRATE 0.05 MG/ML IJ SOLN
12.5000 ug | Freq: Once | INTRAMUSCULAR | Status: AC
  Administered 2014-05-09: 12.5 ug via INTRAVENOUS
  Filled 2014-05-09: qty 2

## 2014-05-09 NOTE — Progress Notes (Signed)
INITIAL NUTRITION ASSESSMENT  DOCUMENTATION CODES Per approved criteria  -Not Applicable   INTERVENTION: -Recommend Resource Breeze po BID, each supplement provides 250 kcal and 9 grams of protein -Modify to Ensure Complete once daily with diet advancement -Reviewed examples of healthy balanced snacks to incorporate into diet upon d/c -Recommend MVI -RD to continue to monitor   NUTRITION DIAGNOSIS: Inadequate oral intake related to stressors/nausea as evidenced by decreased PO intake, hx of unintentional wt loss.   Goal: Pt to meet >/= 90% of their estimated nutrition needs    Monitor:  Diet order, total protein/energy intake, labs, weights, GI profile  Reason for Assessment: Consult to Assess  38 y.o. female  Admitting Dx: Seizures  ASSESSMENT: Pt is 38 yo female with history of seizures and using Keppra, alcohol abuse, medical non compliance, presented to [facility] ED with main concern of episode of seizure prior to the arrival to ED  -Pt reported hx of 17 lb unintentional wt loss (120-103 lbs) with the stressors of recent divorce; however has been able to regain weight over past two months -Diet recall indicates pt consuming two meals daily with occasional snack of trial mix or nuts. Pt lives alone, and has difficulty having consistent meal/snack intake. Drinks Ensure or Boost as meal replacement occasionally. Has tried [employer], but does not enjoy -Currently on clear liquid diet with poor PO intake d/t nausea, approximately 50% PO intake. Was willing to trial [employer] and transition to Ensure as tolerated -Reviewed additional snacks ideas with patient to incorporate into diet upon d/c to ensure adequate nutritional intake. MD noted pt with hx of ETOH abuse, which is also likely contributing to sub-optimal nutrition; however pt has been trying to decrease ETOH intake significantly.  Height: Ht Readings from Last 1 Encounters:  05/08/14 5\' 6"  (1.676 m)     Weight: Wt Readings from Last 1 Encounters:  05/09/14 137 lb 12.6 oz (62.5 kg)    Ideal Body Weight: 130 lb  % Ideal Body Weight: 105%  Wt Readings from Last 10 Encounters:  05/09/14 137 lb 12.6 oz (62.5 kg)  04/17/14 127 lb (57.607 kg)  04/04/14 133 lb (60.328 kg)  04/03/14 125 lb (56.7 kg)  03/30/14 126 lb (57.153 kg)  03/06/14 126 lb 9.6 oz (57.425 kg)  02/27/14 117 lb 11.2 oz (53.388 kg)  02/27/14 117 lb 11.2 oz (53.388 kg)  01/29/14 110 lb (49.896 kg)  01/15/14 103 lb (46.72 kg)    Usual Body Weight: 120 lb  % Usual Body Weight: 114%  BMI:  Body mass index is 22.25 kg/(m^2).  Estimated Nutritional Needs: Kcal: 1600-1800 Protein: 65-75 gram Fluid: >/= 1600 ml daily   Skin:  Diet Order: Clear Liquid  EDUCATION NEEDS: -Education needs addressed   Intake/Output Summary (Last 24 hours) at 05/09/14 1028 Last data filed at 05/09/14 1015  Gross per 24 hour  Intake   2150 ml  Output   4925 ml  Net  -2775 ml    Last BM: 10/20   Labs:   Recent Labs Lab 05/07/14 2123 05/08/14 0400  NA 136* 137  K 2.6* 3.3*  CL 93* 101  CO2 28 25  BUN 9 9  CREATININE 0.46* 0.49*  CALCIUM 9.4 7.6*  MG 1.8  --   GLUCOSE 92 91    CBG (last 3)  No results found for this basename: GLUCAP,  in the last 72 hours  Scheduled Meds: . carvedilol  1.5625 mg Oral BID WC  . FLUoxetine  40 mg Oral Daily  . folic acid  1 mg Oral Daily  . levETIRAcetam  1,000 mg Oral Daily  . multivitamin with minerals  1 tablet Oral Daily  . pantoprazole (PROTONIX) IV  40 mg Intravenous Q12H  . thiamine  100 mg Oral Daily   Or  . thiamine  100 mg Intravenous Daily    Continuous Infusions: . sodium chloride 50 mL/hr at 05/09/14 6378    Past Medical History  Diagnosis Date  . Proctitis   . Cysts of both ovaries   . Anemia   . Anxiety   . Blood transfusion without reported diagnosis   . Depression   . Fatty liver 10/05/13  . Cardiac arrest   . Seizures     Past Surgical  History  Procedure Laterality Date  . Ovarian cyst removal    . Laparoscopy N/A 09/28/2013    Procedure: LAPAROSCOPY OPERATIVE;  Surgeon: Terrance Mass, MD;  Location: [facility];  Service: Gynecology;  Laterality: N/A;  . Laparoscopic appendectomy Right 09/28/2013    Procedure: APPENDECTOMY LAPAROSCOPIC;  Surgeon: Terrance Mass, MD;  Location: [facility];  Service: Gynecology;  Laterality: Right;  . Salpingoophorectomy Right 09/28/2013    Procedure: SALPINGO OOPHORECTOMY;  Surgeon: Terrance Mass, MD;  Location: [facility];  Service: Gynecology;  Laterality: Right;  . Colonoscopy N/A 09/30/2013    Procedure: COLONOSCOPY;  Surgeon: Lafayette Dragon, MD;  Location: [facility];  Service: Endoscopy;  Laterality: N/A;  . Esophagogastroduodenoscopy N/A 11/23/2013    Procedure: ESOPHAGOGASTRODUODENOSCOPY (EGD);  Surgeon: Jerene Bears, MD;  Location: [facility];  Service: Endoscopy;  Laterality: N/A;  . Appendectomy      [place] Clinical Dietitian HYIFO:[phone]

## 2014-05-09 NOTE — Progress Notes (Addendum)
Patient ID: Kristen Roberson, female   DOB: August 20, 1975, 38 y.o.   MRN: [id number]  [facility] PROGRESS NOTE  Kristen Roberson QIW:[id number] DOB: 09-10-1975 DOA: 05/07/2014 PCP: Angelica Chessman, MD  Brief narrative:  38 yo female with history of seizures and using Keppra, alcohol abuse, medical non compliance who presented to [facility] ED 05/07/2014 with main concern of episode of seizure prior to the arrival to ED. Patient did report she used to drink heavily and has cut down significantly over the past week.Per family report, patient has had a seizure episode and was post-ictal afterwards.   In ED, pt is hemodynamically stable, VSS except tachycardia, HR 110 - 120's, blood work notable for Hg 9.1 (last Hg ~11 2 weeks ago but at baseline ~ 9 - 10), plt 57, K 2.6. Alcohol level was 18 on admission. Normal UDS. CXR on admission showed no active disease.   Assessment/Plan:   Principal Problem:  Seizures secondary to acute alcohol intoxication and withdrawal  Patient has cut down drinking. Alcohol level still elevated at 18 on this admission but less compared to the values a week prior to this admission.  Continue supportive care with IV fluids, multivitamin, thiamine and folic acid.  Continue keppra 1000 mg daily.  No seizures since arrival to the hospital Stable for transfer to telemetry bed   Active Problems:  Acute encephalopathy secondary to acute alcohol intoxication / Alcohol withdrawal  Alcohol level 18 on admission. No sings of withdrawal  Continue CIWA protocol  Mental status at baseline, stable  Acute blood loss anemia / Anemia of chronic disease  Likely due to bone marrow suppression from alcohol abuse.  Hemoglobin dropped further to 7.4.S/P  1 unit PRBC transfusion 05/08/2014. CBC showed hemoglobin of 10.2 post transfusion.  Thrombocytopenia / leukopenia  Likely bone marrow suppression for alcohol abuse  Platelet count 57 --> 40 -- >84. Hyponatremia  From alcohol abuse  Now  resolved with IVF administration  Transaminitis  Secondary to alcohol abuse  Check LFT's in AM to follow up on trending Severe PCM  Secondary to chronic alcohol abuse and poor nutritional habits  Nutritionist consulted  Hypokalemia  Likely electrolyte depletion from alcohol abuse. Supplemented.  Magnesium was WNL. Potassium 3.6 today so will give potassium chloride 40 meq once. DVT Prophylaxis  SCD's bilaterally   Code Status: Full.  Family Communication: plan of care discussed with the patient  Disposition Plan: transfer to telemetry bed   IV Access:   Peripheral IV Procedures and diagnostic studies:    CXR 05/08/2014 No active disease.  Medical Consultants:   None  Other Consultants:   None  Anti-Infectives:   None   Leisa Lenz, MD  [facility] Pager [phone]  If 7PM-7AM, please contact night-coverage [email or web address] Password [email or web address] 05/09/2014, 9:04 AM   LOS: 2 days    HPI/Subjective: No acute overnight events.  Objective: Filed Vitals:   05/09/14 0000 05/09/14 0400 05/09/14 0600 05/09/14 0800  BP: 121/58  154/137 164/89  Pulse: 86  63 79  Temp: 98.3 F (36.8 C) 97.9 F (36.6 C) 97.9 F (36.6 C)   TempSrc: Oral Oral Oral   Resp: 19  15 18   Height:      Weight:  62.5 kg (137 lb 12.6 oz)    SpO2: 100%  96%     Intake/Output Summary (Last 24 hours) at 05/09/14 0904 Last data filed at 05/09/14 0600  Gross per 24 hour  Intake   2150 ml  Output   3175  ml  Net  -1025 ml    Exam:   General:  Pt is alert, follows commands appropriately, not in acute distress  Cardiovascular: Regular rate and rhythm, S1/S2, no murmurs  Respiratory: no wheezing, no crackles, no rhonchi  Abdomen: non tender, non distended, bowel sounds present  Extremities: pulses DP and PT palpable bilaterally  Neuro: Grossly nonfocal  Data Reviewed: Basic Metabolic Panel:  Recent Labs Lab 05/07/14 2123 05/08/14 0400  NA 136* 137  K 2.6* 3.3*  CL 93* 101   CO2 28 25  GLUCOSE 92 91  BUN 9 9  CREATININE 0.46* 0.49*  CALCIUM 9.4 7.6*  MG 1.8  --    Liver Function Tests:  Recent Labs Lab 05/08/14 0400  AST 82*  ALT 19  ALKPHOS 101  BILITOT 0.3  PROT 5.5*  ALBUMIN 3.1*   CBC:  Recent Labs Lab 05/07/14 2123 05/08/14 0400  WBC 4.1 2.8*  NEUTROABS 3.2  --   HGB 9.1* 7.4*  HCT 28.2* 22.8*  MCV 90.7 92.3  PLT 57* 40*    URINE CULTURE     Status: None   Collection Time    05/07/14 11:56 PM      Result Value Ref Range Status   Specimen Description URINE, RANDOM   Final   Value: INSIGNIFICANT GROWTH     Performed at [facility]   Report Status 05/09/2014 FINAL   Final     Scheduled Meds: . carvedilol  1.5625 mg Oral BID WC  . FLUoxetine  40 mg Oral Daily  . folic acid  1 mg Oral Daily  . levETIRAcetam  1,000 mg Oral Daily  . multivitamin with minerals  1 tablet Oral Daily  . pantoprazole (PROTONIX) IV  40 mg Intravenous Q12H  . thiamine  100 mg Oral Daily   Or  . thiamine  100 mg Intravenous Daily   Continuous Infusions: . sodium chloride 75 mL/hr at 05/07/14 2354

## 2014-05-09 NOTE — Evaluation (Signed)
Physical Therapy One Time Evaluation Patient Details Name: Kristen Roberson MRN: [id number] DOB: [birth year] Today's Date: 05/09/2014   History of Present Illness  Pt is a 38 year old female admitted 05/07/14 for seizures secondary to acute alcohol intoxication and withdrawal and has a PMHx of seizure, alcohol dependence, cardiac arrest.  Clinical Impression  Patient evaluated by Physical Therapy with no further acute PT needs identified. All education has been completed and the patient has no further questions.  Pt mobilizing well, mostly supervision due to admission for seizures.  No unsteady gait or LOB observed. No follow-up Physial Therapy or equipment needs identified. PT is signing off. Thank you for this referral.     Follow Up Recommendations No PT follow up    Equipment Recommendations  None recommended by PT    Recommendations for Other Services       Precautions / Restrictions Precautions Precautions: Fall      Mobility  Bed Mobility Overal bed mobility: Modified Independent                Transfers Overall transfer level: Needs assistance Equipment used: None Transfers: Sit to/from Stand Sit to Stand: Supervision         General transfer comment: supervision for safety (admitted for seizures)  Ambulation/Gait Ambulation/Gait assistance: Supervision Ambulation Distance (Feet): 350 Feet Assistive device: None Gait Pattern/deviations: WFL(Within Functional Limits)     General Gait Details: no unsteadiness or LOB observed, pt has no complaints  Stairs            Wheelchair Mobility    Modified Rankin (Stroke Patients Only)       Balance                                             Pertinent Vitals/Pain Pain Assessment: No/denies pain    Home Living Family/patient expects to be discharged to:: Private residence Living Arrangements: Spouse/significant other Available Help at Discharge: Family;Available 24  hours/day Type of Home: Apartment Home Access: Stairs to enter Entrance Stairs-Rails: Psychiatric nurse of Steps: 15 Home Layout: One level Home Equipment: None Additional Comments: boyfriend available at all times    Prior Function Level of Independence: Independent         Comments: pt reports not eating much due to going through divorce after 67yrs.      Hand Dominance   Dominant Hand: Right    Extremity/Trunk Assessment   Upper Extremity Assessment: RUE deficits/detail RUE Deficits / Details: Pt reports R sided weakness since cardiac arrest however reports improving, no functional limitations observed         Lower Extremity Assessment: RLE deficits/detail RLE Deficits / Details: Pt reports R sided weakness since cardiac arrest however reports improving, no functional limitations observed, no knee buckling observed during gait     Cervical / Trunk Assessment: Normal  Communication   Communication: No difficulties  Cognition Arousal/Alertness: Awake/alert Behavior During Therapy: WFL for tasks assessed/performed Overall Cognitive Status: Within Functional Limits for tasks assessed                      General Comments      Exercises        Assessment/Plan    PT Assessment Patent does not need any further PT services  PT Diagnosis Difficulty walking   PT Problem List    PT  Treatment Interventions     PT Goals (Current goals can be found in the Care Plan section) Acute Rehab PT Goals PT Goal Formulation: All assessment and education complete, DC therapy    Frequency     Barriers to discharge        Co-evaluation               End of Session   Activity Tolerance: Patient tolerated treatment well Patient left: in bed;with call bell/phone within reach;with bed alarm set           Time: 5409-8119 PT Time Calculation (min): 11 min   Charges:   PT Evaluation $Initial PT Evaluation Tier I: 1 Procedure      PT G Codes:          Haydon Dorris,KATHrine E 05/09/2014, 3:03 PM Carmelia Bake, PT, DPT 05/09/2014 Pager: [phone]

## 2014-05-09 NOTE — Progress Notes (Signed)
Pt was becoming increasingly anxious with tremors and diaphoresis. Pt was not due to have Ativan until 0800. CIWA was ordered Q4H and appeared well because patient had recently received Ativan. Med order changed to Q4H to match CIWA by hospitalist.

## 2014-05-10 DIAGNOSIS — G9340 Encephalopathy, unspecified: Secondary | ICD-10-CM

## 2014-05-10 DIAGNOSIS — F4321 Adjustment disorder with depressed mood: Secondary | ICD-10-CM

## 2014-05-10 LAB — COMPREHENSIVE METABOLIC PANEL
ALT: 24 U/L (ref 0–35)
ANION GAP: 10 (ref 5–15)
AST: 71 U/L — ABNORMAL HIGH (ref 0–37)
Albumin: 3.3 g/dL — ABNORMAL LOW (ref 3.5–5.2)
Alkaline Phosphatase: 108 U/L (ref 39–117)
BUN: 4 mg/dL — AB (ref 6–23)
CO2: 28 mEq/L (ref 19–32)
Calcium: 8.4 mg/dL (ref 8.4–10.5)
Chloride: 101 mEq/L (ref 96–112)
Creatinine, Ser: 0.58 mg/dL (ref 0.50–1.10)
GFR calc non Af Amer: >90 mL/min (ref >90)
GLUCOSE: 105 mg/dL — AB (ref 70–99)
Potassium: 3.8 mEq/L (ref 3.7–5.3)
SODIUM: 139 meq/L (ref 137–147)
TOTAL PROTEIN: 6 g/dL (ref 6.0–8.3)
Total Bilirubin: 0.5 mg/dL (ref 0.3–1.2)

## 2014-05-10 LAB — CBC
HCT: 32 % — ABNORMAL LOW (ref 36.0–46.0)
HEMOGLOBIN: 10.4 g/dL — AB (ref 12.0–15.0)
MCH: 29.9 pg (ref 26.0–34.0)
MCHC: 32.5 g/dL (ref 30.0–36.0)
MCV: 92 fL (ref 78.0–100.0)
Platelets: 133 10*3/uL — ABNORMAL LOW (ref 150–400)
RBC: 3.48 MIL/uL — AB (ref 3.87–5.11)
RDW: 18.9 % — ABNORMAL HIGH (ref 11.5–15.5)
WBC: 2.5 10*3/uL — ABNORMAL LOW (ref 4.0–10.5)

## 2014-05-10 MED ORDER — LEVETIRACETAM 1000 MG PO TABS: 1000.0000 mg | ORAL_TABLET | Freq: Every day | ORAL | Status: DC

## 2014-05-10 MED ORDER — HYDROCODONE-ACETAMINOPHEN 5-325 MG PO TABS: 1.0000 | ORAL_TABLET | Freq: Four times a day (QID) | ORAL | Status: DC | PRN

## 2014-05-10 MED ORDER — CARVEDILOL 3.125 MG PO TABS
1.5625 mg | ORAL_TABLET | Freq: Two times a day (BID) | ORAL | Status: DC
Start: 2014-05-10 — End: 2014-05-14

## 2014-05-10 MED ORDER — THIAMINE HCL 100 MG PO TABS: 100.0000 mg | ORAL_TABLET | Freq: Every day | ORAL | Status: DC

## 2014-05-10 MED ORDER — HYDROMORPHONE HCL 1 MG/ML IJ SOLN
0.5000 mg | Freq: Once | INTRAMUSCULAR | Status: AC
  Administered 2014-05-10: 0.5 mg via INTRAVENOUS
  Filled 2014-05-10: qty 1

## 2014-05-10 MED ORDER — ADULT MULTIVITAMIN W/MINERALS CH: 1.0000 | ORAL_TABLET | Freq: Every day | ORAL | Status: DC

## 2014-05-10 MED ORDER — BOOST / RESOURCE BREEZE PO LIQD: 1.0000 | Freq: Two times a day (BID) | ORAL | Status: DC
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
## 2014-05-10 NOTE — Discharge Instructions (Addendum)

## 2014-05-11 ENCOUNTER — Encounter (HOSPITAL_COMMUNITY): Payer: Self-pay | Admitting: Emergency Medicine

## 2014-05-11 ENCOUNTER — Emergency Department (HOSPITAL_COMMUNITY)
Admission: EM | Admit: 2014-05-11 | Discharge: 2014-05-11 | Discharge status: Against Medical Advice | Payer: Self-pay | Attending: Emergency Medicine | Admitting: Emergency Medicine

## 2014-05-11 DIAGNOSIS — Z79899 Other long term (current) drug therapy: Secondary | ICD-10-CM | POA: Insufficient documentation

## 2014-05-11 DIAGNOSIS — R569 Unspecified convulsions: Secondary | ICD-10-CM

## 2014-05-11 DIAGNOSIS — D649 Anemia, unspecified: Secondary | ICD-10-CM | POA: Insufficient documentation

## 2014-05-11 DIAGNOSIS — F419 Anxiety disorder, unspecified: Secondary | ICD-10-CM | POA: Insufficient documentation

## 2014-05-11 DIAGNOSIS — Z8674 Personal history of sudden cardiac arrest: Secondary | ICD-10-CM | POA: Insufficient documentation

## 2014-05-11 DIAGNOSIS — G40909 Epilepsy, unspecified, not intractable, without status epilepticus: Secondary | ICD-10-CM | POA: Insufficient documentation

## 2014-05-11 DIAGNOSIS — Z8719 Personal history of other diseases of the digestive system: Secondary | ICD-10-CM | POA: Insufficient documentation

## 2014-05-11 DIAGNOSIS — F329 Major depressive disorder, single episode, unspecified: Secondary | ICD-10-CM | POA: Insufficient documentation

## 2014-05-11 DIAGNOSIS — Z880 Allergy status to penicillin: Secondary | ICD-10-CM | POA: Insufficient documentation

## 2014-05-11 NOTE — ED Provider Notes (Signed)
CSN: [id number]     Arrival date & time 05/11/14  1800 History   First MD Initiated Contact with Patient 05/11/14 1806     Chief Complaint  Patient presents with  . Seizures     (Consider location/radiation/quality/duration/timing/severity/associated sxs/prior Treatment) HPI  She presents to the emergency department for evaluation of the unwitnessed seizure, patient reports it happened while she was sitting on the toilet and lasted for less than 1 minute, and rectal pain. She reports having a difficult time going to the bathroom. The patient was just discharged from the hospital yesterday for seizures after a three-day stay. During her stay in the hospital it does not appear that she had any seizure activity. The patient appears well and has normal vital signs. She says that she has not taken any of her medications and discharged from the hospital. She does not have a good reason and says that she does has not taken it yet.   Past Medical History  Diagnosis Date  . Proctitis   . Cysts of both ovaries   . Anemia   . Anxiety   . Blood transfusion without reported diagnosis   . Depression   . Fatty liver 10/05/13  . Cardiac arrest   . Seizures    Past Surgical History  Procedure Laterality Date  . Ovarian cyst removal    . Laparoscopy N/A 09/28/2013    Procedure: LAPAROSCOPY OPERATIVE;  Surgeon: Terrance Mass, MD;  Location: [facility];  Service: Gynecology;  Laterality: N/A;  . Laparoscopic appendectomy Right 09/28/2013    Procedure: APPENDECTOMY LAPAROSCOPIC;  Surgeon: Terrance Mass, MD;  Location: [facility];  Service: Gynecology;  Laterality: Right;  . Salpingoophorectomy Right 09/28/2013    Procedure: SALPINGO OOPHORECTOMY;  Surgeon: Terrance Mass, MD;  Location: [facility];  Service: Gynecology;  Laterality: Right;  . Colonoscopy N/A 09/30/2013    Procedure: COLONOSCOPY;  Surgeon: Lafayette Dragon, MD;  Location: [facility];  Service: Endoscopy;  Laterality: N/A;  .  Esophagogastroduodenoscopy N/A 11/23/2013    Procedure: ESOPHAGOGASTRODUODENOSCOPY (EGD);  Surgeon: Jerene Bears, MD;  Location: [facility];  Service: Endoscopy;  Laterality: N/A;  . Appendectomy     Family History  Problem Relation Age of Onset  . Diabetes Mother   . Hyperlipidemia Mother   . Stroke Mother   . Diabetes Father    History  Substance Use Topics  . Smoking status: Never Smoker   . Smokeless tobacco: Never Used  . Alcohol Use: Yes   OB History   Grav Para Term Preterm Abortions TAB SAB Ect Mult Living   7 3   4  4   3      Review of Systems  All other systems reviewed and are negative.     Allergies  Morphine and related; Tramadol; and Penicillins  Home Medications   Prior to Admission medications   Medication Sig Start Date End Date Taking? Authorizing Provider  acetaminophen (TYLENOL) 325 MG tablet Take 650 mg by mouth every 6 (six) hours as needed (pain).   Yes Historical Provider, MD  carvedilol (COREG) 3.125 MG tablet Take 0.5 tablets (1.5625 mg total) by mouth 2 (two) times daily with a meal. 05/10/14  Yes Robbie Lis, MD  FLUoxetine (PROZAC) 20 MG capsule Take 40 mg by mouth daily.    Yes Historical Provider, MD  folic acid (FOLVITE) 1 MG tablet Take 1 tablet (1 mg total) by mouth daily. 04/17/14  Yes Eugenie Filler, MD  HYDROcodone-acetaminophen (NORCO/VICODIN)  5-325 MG per tablet Take 1 tablet by mouth every 6 (six) hours as needed for moderate pain (pain).   Yes Historical Provider, MD  levETIRAcetam (KEPPRA) 1000 MG tablet Take 1 tablet (1,000 mg total) by mouth daily. 05/10/14  Yes Robbie Lis, MD  Multiple Vitamin (MULTIVITAMIN WITH MINERALS) TABS tablet Take 1 tablet by mouth daily. 05/10/14  Yes Robbie Lis, MD  potassium chloride SA (K-DUR,KLOR-CON) 20 MEQ tablet Take 1 tablet (20 mEq total) by mouth daily. 04/28/14  Yes Robyn M Hess, PA-C  thiamine 100 MG tablet Take 1 tablet (100 mg total) by mouth daily. 05/10/14  Yes Robbie Lis, MD   traZODone (DESYREL) 100 MG tablet Take 100 mg by mouth at bedtime as needed for sleep (sleep).    Yes Historical Provider, MD   BP 113/73  Pulse 107  Temp(Src) 98.1 F (36.7 C) (Oral)  Resp 16  SpO2 99%  LMP 03/26/2014 Physical Exam  Nursing note and vitals reviewed. Constitutional: She appears well-developed and well-nourished. No distress.  HENT:  Head: Normocephalic and atraumatic.  Eyes: Pupils are equal, round, and reactive to light.  Neck: Normal range of motion. Neck supple.  Cardiovascular: Normal rate and regular rhythm.   Pulmonary/Chest: Effort normal.  Abdominal: Soft. Bowel sounds are normal. She exhibits no distension and no fluid wave. There is no tenderness. There is no rigidity, no rebound and no guarding.  Neurological: She is alert.  Skin: Skin is warm and dry.    ED Course  Procedures (including critical care time) Labs Review Labs Reviewed - No data to display  Imaging Review No results found.   EKG Interpretation None      MDM   Final diagnoses:  Seizure    Dr. Tomi Bamberger, I. Saw patient as well. Pt appears well, non compliant with her medications. Will do screening labs and if stable discharge from thee hospital. She is requesting pain medications.  Filed Vitals:   05/11/14 1801  BP: 113/73  Pulse: 107  Temp: 98.1 F (36.7 C)  Resp: 16   Pt told she will most likely be able to go home today if her work up was normal and decided to leave without making staff aware. Labs had not yet been drawn yet before she left. Dr. Tomi Bamberger and myself made aware.    Linus Mako, PA-C 05/11/14 2057  Linus Mako, PA-C 05/12/14 2336
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
## 2014-05-11 NOTE — ED Notes (Signed)
Went to check on patient. Patient not in room. Lab informed this RN patient left AMA. Was not previously made aware patient had left. Tiffany PA-C and Charge nurse made aware of same.

## 2014-05-11 NOTE — Progress Notes (Signed)
  CARE MANAGEMENT ED NOTE 05/11/2014  Patient:  Kristen Roberson, Kristen Roberson   Account Number:  [id number]  Date Initiated:  05/11/2014  Documentation initiated by:  Livia Snellen  Subjective/Objective Assessment:   Patient presents to Ed with seizure like activity.     Subjective/Objective Assessment Detail:   Patient with pmhx of seizures and alcohol abuse.  Patient is noted to be noncompliant with  seizure medications and follow up care.  Patient have 34 ED visits within the last six months with 7 admissions.  Patient recently discharged on 05/10/2014     Action/Plan:   [facility] went to speak to patient at bedside, however patient not in her room.  Was told patient left ED AMA.   Action/Plan Detail:   Anticipated DC Date:       Status Recommendation to Physician:   Result of Recommendation:        Choice offered to / List presented to:            Status of service:    ED Comments:   ED Comments Detail:

## 2014-05-11 NOTE — ED Provider Notes (Signed)
Pt states she has a hard time using the bathroom to defecate and today she was sitting on the toilet and her right arm started jerking then her face started getting numb and twitching and the next thing she knew she was on the bathroom floor. Her boyfriend who is here did not witness the event.   Pt is alert and is holding her abdomen. She in neurologically intact.   Medical screening examination/treatment/procedure(s) were conducted as a shared visit with non-physician practitioner(s) and myself.  I personally evaluated the patient during the encounter.   EKG Interpretation None       Rolland Porter, MD, Abram Sander   Janice Norrie, MD 05/11/14 Karl Bales

## 2014-05-13 NOTE — ED Provider Notes (Signed)
Medical screening examination/treatment/procedure(s) were performed by non-physician practitioner and as supervising physician I was immediately available for consultation/collaboration.   EKG Interpretation None      Jailynne Opperman, MD, FACEP   Vickee Mormino L Arne Schlender, MD 05/13/14 0022 

## 2014-05-14 ENCOUNTER — Encounter (HOSPITAL_COMMUNITY): Payer: Self-pay | Admitting: Emergency Medicine

## 2014-05-14 ENCOUNTER — Emergency Department (HOSPITAL_COMMUNITY)
Admission: EM | Admit: 2014-05-14 | Discharge: 2014-05-15 | Disposition: A | Discharge status: Home / Self Care | Payer: Self-pay | Attending: Emergency Medicine | Admitting: Emergency Medicine

## 2014-05-14 DIAGNOSIS — Z79899 Other long term (current) drug therapy: Secondary | ICD-10-CM | POA: Insufficient documentation

## 2014-05-14 DIAGNOSIS — F1010 Alcohol abuse, uncomplicated: Secondary | ICD-10-CM | POA: Insufficient documentation

## 2014-05-14 DIAGNOSIS — D649 Anemia, unspecified: Secondary | ICD-10-CM | POA: Insufficient documentation

## 2014-05-14 DIAGNOSIS — R569 Unspecified convulsions: Secondary | ICD-10-CM

## 2014-05-14 DIAGNOSIS — Z880 Allergy status to penicillin: Secondary | ICD-10-CM | POA: Insufficient documentation

## 2014-05-14 DIAGNOSIS — F348 Other persistent mood [affective] disorders: Secondary | ICD-10-CM | POA: Insufficient documentation

## 2014-05-14 DIAGNOSIS — F329 Major depressive disorder, single episode, unspecified: Secondary | ICD-10-CM | POA: Insufficient documentation

## 2014-05-14 DIAGNOSIS — Z8742 Personal history of other diseases of the female genital tract: Secondary | ICD-10-CM | POA: Insufficient documentation

## 2014-05-14 DIAGNOSIS — Z8674 Personal history of sudden cardiac arrest: Secondary | ICD-10-CM | POA: Insufficient documentation

## 2014-05-14 DIAGNOSIS — G40909 Epilepsy, unspecified, not intractable, without status epilepticus: Secondary | ICD-10-CM | POA: Insufficient documentation

## 2014-05-14 DIAGNOSIS — K648 Other hemorrhoids: Secondary | ICD-10-CM | POA: Insufficient documentation

## 2014-05-14 LAB — RAPID URINE DRUG SCREEN, HOSP PERFORMED
AMPHETAMINES: NOT DETECTED
BARBITURATES: NOT DETECTED
Benzodiazepines: NOT DETECTED
COCAINE: NOT DETECTED
Opiates: NOT DETECTED
Tetrahydrocannabinol: NOT DETECTED

## 2014-05-14 LAB — COMPREHENSIVE METABOLIC PANEL
ALT: 40 U/L — ABNORMAL HIGH (ref 0–35)
AST: 117 U/L — ABNORMAL HIGH (ref 0–37)
Albumin: 4 g/dL (ref 3.5–5.2)
Alkaline Phosphatase: 141 U/L — ABNORMAL HIGH (ref 39–117)
Anion gap: 15 (ref 5–15)
BILIRUBIN TOTAL: 0.3 mg/dL (ref 0.3–1.2)
BUN: 12 mg/dL (ref 6–23)
CHLORIDE: 103 meq/L (ref 96–112)
CO2: 26 mEq/L (ref 19–32)
CREATININE: 0.45 mg/dL — AB (ref 0.50–1.10)
Calcium: 8.9 mg/dL (ref 8.4–10.5)
GFR calc Af Amer: >90 mL/min (ref >90)
GFR calc non Af Amer: >90 mL/min (ref >90)
Glucose, Bld: 115 mg/dL — ABNORMAL HIGH (ref 70–99)
Potassium: 4.5 mEq/L (ref 3.7–5.3)
Sodium: 144 mEq/L (ref 137–147)
Total Protein: 7.5 g/dL (ref 6.0–8.3)

## 2014-05-14 LAB — CBC
HEMATOCRIT: 37.2 % (ref 36.0–46.0)
Hemoglobin: 11.8 g/dL — ABNORMAL LOW (ref 12.0–15.0)
MCH: 29.4 pg (ref 26.0–34.0)
MCHC: 31.7 g/dL (ref 30.0–36.0)
MCV: 92.8 fL (ref 78.0–100.0)
Platelets: 369 10*3/uL (ref 150–400)
RBC: 4.01 MIL/uL (ref 3.87–5.11)
RDW: 18.6 % — ABNORMAL HIGH (ref 11.5–15.5)
WBC: 2.9 10*3/uL — AB (ref 4.0–10.5)

## 2014-05-14 LAB — ACETAMINOPHEN LEVEL: Acetaminophen (Tylenol), Serum: <15 ug/mL (ref 10–30)

## 2014-05-14 LAB — ETHANOL: ALCOHOL ETHYL (B): 322 mg/dL — AB (ref 0–11)

## 2014-05-14 LAB — SALICYLATE LEVEL: Salicylate Lvl: <2 mg/dL — ABNORMAL LOW (ref 2.8–20.0)

## 2014-05-14 NOTE — ED Notes (Signed)
Pt. wearing purple paper scrubs , non slip socks , sitter with pt.

## 2014-05-14 NOTE — ED Notes (Signed)
Per EMS, pt had witnessed seizure today and is suicidal. Pt had 2 pseudoseizures with EMS. Pt having RLQ pain. 107/71. No tachy during seizure only pain. cbg 118. 20 rt hand

## 2014-05-14 NOTE — ED Notes (Signed)
Pt placed in scrubs. Security wanded Insurance risk surveyor called.

## 2014-05-14 NOTE — ED Provider Notes (Signed)
CSN: [id number]     Arrival date & time 05/14/14  1847 History   First MD Initiated Contact with Patient 05/14/14 2257     Chief Complaint  Patient presents with  . Seizures  . Suicidal     (Consider location/radiation/quality/duration/timing/severity/associated sxs/prior Treatment) HPI  This is a 38 year old female with a history of seizure disorder, alcohol abuse, anemia who presents with seizure activity and hemorrhoids. Initially, patient was brought in by EMS following witnessed seizure activity by friend. Patient states that she does remember the episodes.  Per documentation, EMS reported witnessing to pseudoseizures. Patient is on Curtis but states that she is not taking it because it "does not work." On my evaluation, patient complaining of "pain in my bum." Patient reports chronic hemorrhoids with worsening pain. She has tried sitz baths at home without relief. Patient reports right lower quadrant abdominal pain without nausea, vomiting, or diarrhea. Per nursing notes, patient also endorsed suicidal ideation. Patient states "I just said that at the wrong time." Patient states that she is not suicidal and has 3 kids that she needs to take care of. She reports daily alcohol use. Of note blood alcohol upon arrival at approximately 8 PM was greater than 300. My evaluation it was approximately 11:30 PM. Patient clinically appears sober.  Past Medical History  Diagnosis Date  . Proctitis   . Cysts of both ovaries   . Anemia   . Anxiety   . Blood transfusion without reported diagnosis   . Depression   . Fatty liver 10/05/13  . Cardiac arrest   . Seizures    Past Surgical History  Procedure Laterality Date  . Ovarian cyst removal    . Laparoscopy N/A 09/28/2013    Procedure: LAPAROSCOPY OPERATIVE;  Surgeon: Terrance Mass, MD;  Location: [facility];  Service: Gynecology;  Laterality: N/A;  . Laparoscopic appendectomy Right 09/28/2013    Procedure: APPENDECTOMY LAPAROSCOPIC;  Surgeon:  Terrance Mass, MD;  Location: [facility];  Service: Gynecology;  Laterality: Right;  . Salpingoophorectomy Right 09/28/2013    Procedure: SALPINGO OOPHORECTOMY;  Surgeon: Terrance Mass, MD;  Location: [facility];  Service: Gynecology;  Laterality: Right;  . Colonoscopy N/A 09/30/2013    Procedure: COLONOSCOPY;  Surgeon: Lafayette Dragon, MD;  Location: [facility];  Service: Endoscopy;  Laterality: N/A;  . Esophagogastroduodenoscopy N/A 11/23/2013    Procedure: ESOPHAGOGASTRODUODENOSCOPY (EGD);  Surgeon: Jerene Bears, MD;  Location: [facility];  Service: Endoscopy;  Laterality: N/A;  . Appendectomy     Family History  Problem Relation Age of Onset  . Diabetes Mother   . Hyperlipidemia Mother   . Stroke Mother   . Diabetes Father    History  Substance Use Topics  . Smoking status: Never Smoker   . Smokeless tobacco: Never Used  . Alcohol Use: Yes   OB History   Grav Para Term Preterm Abortions TAB SAB Ect Mult Living   7 3   4  4   3      Review of Systems  Constitutional: Negative for fever.  Respiratory: Negative for cough, chest tightness and shortness of breath.   Cardiovascular: Negative for chest pain.  Gastrointestinal: Positive for abdominal pain. Negative for nausea and vomiting.       Hemorrhoids  Genitourinary: Negative for dysuria.  Musculoskeletal: Negative for back pain.  Neurological: Negative for headaches.  Psychiatric/Behavioral: Negative for suicidal ideas, confusion and agitation. The patient is not nervous/anxious.   All other systems reviewed and are negative.  Allergies  Morphine and related; Tramadol; and Penicillins  Home Medications   Prior to Admission medications   Medication Sig Start Date End Date Taking? Authorizing Provider  acetaminophen (TYLENOL) 325 MG tablet Take 650 mg by mouth every 6 (six) hours as needed (pain).   Yes Historical Provider, MD  carvedilol (COREG) 3.125 MG tablet Take 1.5625 mg by mouth 2 (two) times daily with a meal.    Yes Historical Provider, MD  FLUoxetine (PROZAC) 20 MG capsule Take 40 mg by mouth daily.    Yes Historical Provider, MD  folic acid (FOLVITE) 1 MG tablet Take 1 mg by mouth daily.   Yes Historical Provider, MD  HYDROcodone-acetaminophen (NORCO/VICODIN) 5-325 MG per tablet Take 1 tablet by mouth every 6 (six) hours as needed for moderate pain (pain).   Yes Historical Provider, MD  levETIRAcetam (KEPPRA) 1000 MG tablet Take 1,000 mg by mouth daily.   Yes Historical Provider, MD  Multiple Vitamin (MULTI-VITAMIN PO) Take 1 tablet by mouth daily.   Yes Historical Provider, MD  potassium chloride SA (K-DUR,KLOR-CON) 20 MEQ tablet Take 20 mEq by mouth daily.   Yes Historical Provider, MD  thiamine 100 MG tablet Take 100 mg by mouth daily.   Yes Historical Provider, MD  traZODone (DESYREL) 100 MG tablet Take 100 mg by mouth at bedtime as needed for sleep (sleep).    Yes Historical Provider, MD  starch (ANUSOL) 51 % suppository Place 1 suppository rectally as needed for pain. 05/15/14   Merryl Hacker, MD   BP 129/82  Pulse 93  Temp(Src) 98 F (36.7 C) (Oral)  Resp 17  SpO2 99%  LMP 03/26/2014 Physical Exam  Nursing note and vitals reviewed. Constitutional: She is oriented to person, place, and time. She appears well-developed and well-nourished. No distress.  Strange affect  HENT:  Head: Normocephalic and atraumatic.  Eyes: Pupils are equal, round, and reactive to light.  Cardiovascular: Normal rate, regular rhythm and normal heart sounds.   No murmur heard. Pulmonary/Chest: Effort normal and breath sounds normal. No respiratory distress. She has no wheezes.  Abdominal: Soft. Bowel sounds are normal. There is no tenderness. There is no rebound and no guarding.  Genitourinary:  Multiple hemorrhoids noted without thrombosis or bleeding, mild erythema perianally, no lesions noted  Neurological: She is alert and oriented to person, place, and time.  Skin: Skin is warm and dry.   Psychiatric: She has a normal mood and affect.  Strange affect    ED Course  Procedures (including critical care time) Labs Review Labs Reviewed  CBC - Abnormal; Notable for the following:    WBC 2.9 (*)    Hemoglobin 11.8 (*)    RDW 18.6 (*)    All other components within normal limits  COMPREHENSIVE METABOLIC PANEL - Abnormal; Notable for the following:    Glucose, Bld 115 (*)    Creatinine, Ser 0.45 (*)    AST 117 (*)    ALT 40 (*)    Alkaline Phosphatase 141 (*)    All other components within normal limits  ETHANOL - Abnormal; Notable for the following:    Alcohol, Ethyl (B) 322 (*)    All other components within normal limits  SALICYLATE LEVEL - Abnormal; Notable for the following:    Salicylate Lvl <2.0 (*)    All other components within normal limits  ACETAMINOPHEN LEVEL  URINE RAPID DRUG SCREEN (HOSP PERFORMED)  URINALYSIS, ROUTINE W REFLEX MICROSCOPIC    Imaging Review No results found.  EKG Interpretation None      MDM   Final diagnoses:  Seizures  Alcohol abuse  Other hemorrhoids   Patient presents with multiple complaints. Patient reports recurrent seizures but is noncompliant with her seizure medication. No seizure activity noted in the ER. Patient also reports hemorrhoids and right lower quadrant abdominal pain. She is status post appendectomy and right oophorectomy. She is nontender on exam. She is nontoxic. Hemorrhoids do not appear thrombosed. Patient denies suicidal ideation to me. She is currently, awake, alert, and appropriate.  Her blood alcohol level upon arrival was 322. Currently appears clinically sober.  Basic lab work is reassuring. She has mild transaminitis which is likely secondary to her alcohol abuse. Hemoglobin is improved from prior hospitalization.  Discussed with patient follow-up with surgery for her hemorrhoids. She is also to follow-up with her neurologist regarding seizure activity. She should resume her Keppra as directed.     After history, exam, and medical workup I feel the patient has been appropriately medically screened and is safe for discharge home. Pertinent diagnoses were discussed with the patient. Patient was given return precautions.    Merryl Hacker, MD 05/15/14 [id number]

## 2014-05-15 LAB — URINALYSIS, ROUTINE W REFLEX MICROSCOPIC
Bilirubin Urine: NEGATIVE
GLUCOSE, UA: NEGATIVE mg/dL
Hgb urine dipstick: NEGATIVE
KETONES UR: NEGATIVE mg/dL
LEUKOCYTES UA: NEGATIVE
Nitrite: NEGATIVE
PROTEIN: NEGATIVE mg/dL
Specific Gravity, Urine: 1.021 (ref 1.005–1.030)
Urobilinogen, UA: 0.2 mg/dL (ref 0.0–1.0)
pH: 6 (ref 5.0–8.0)

## 2014-05-15 MED ORDER — STARCH 51 % RE SUPP: 1.0000 | RECTAL | Status: DC | PRN

## 2014-05-15 NOTE — Discharge Instructions (Signed)
You were seen today for multiple complaints. He will report recurrent seizures.  You need to restart her Keppra and follow up with your neurologist. Kristen Roberson also noted to be intoxicated upon arrival.      Hemorrhoids Your hemorrhoids do not appear to be thrombosed. You can follow with general surgery for options regarding removal. Hemorrhoids are swollen veins around the rectum or anus. There are two types of hemorrhoids:   Internal hemorrhoids. These occur in the veins just inside the rectum. They may poke through to the outside and become irritated and painful.  External hemorrhoids. These occur in the veins outside the anus and can be felt as a painful swelling or hard lump near the anus. CAUSES  Pregnancy.   Obesity.   Constipation or diarrhea.   Straining to have a bowel movement.   Sitting for long periods on the toilet.  Heavy lifting or other activity that caused you to strain.  Anal intercourse. SYMPTOMS   Pain.   Anal itching or irritation.   Rectal bleeding.   Fecal leakage.   Anal swelling.   One or more lumps around the anus.  DIAGNOSIS  Your caregiver may be able to diagnose hemorrhoids by visual examination. Other examinations or tests that may be performed include:   Examination of the rectal area with a gloved hand (digital rectal exam).   Examination of anal canal using a small tube (scope).   A blood test if you have lost a significant amount of blood.  A test to look inside the colon (sigmoidoscopy or colonoscopy). TREATMENT Most hemorrhoids can be treated at home. However, if symptoms do not seem to be getting better or if you have a lot of rectal bleeding, your caregiver may perform a procedure to help make the hemorrhoids get smaller or remove them completely. Possible treatments include:   Placing a rubber band at the base of the hemorrhoid to cut off the circulation (rubber band ligation).   Injecting a chemical to shrink the  hemorrhoid (sclerotherapy).   Using a tool to burn the hemorrhoid (infrared light therapy).   Surgically removing the hemorrhoid (hemorrhoidectomy).   Stapling the hemorrhoid to block blood flow to the tissue (hemorrhoid stapling).  HOME CARE INSTRUCTIONS   Eat foods with fiber, such as whole grains, beans, nuts, fruits, and vegetables. Ask your doctor about taking products with added fiber in them (fibersupplements).  Increase fluid intake. Drink enough water and fluids to keep your urine clear or pale yellow.   Exercise regularly.   Go to the bathroom when you have the urge to have a bowel movement. Do not wait.   Avoid straining to have bowel movements.   Keep the anal area dry and clean. Use wet toilet paper or moist towelettes after a bowel movement.   Medicated creams and suppositories may be used or applied as directed.   Only take over-the-counter or prescription medicines as directed by your caregiver.   Take warm sitz baths for 15-20 minutes, 3-4 times a day to ease pain and discomfort.   Place ice packs on the hemorrhoids if they are tender and swollen. Using ice packs between sitz baths may be helpful.   Put ice in a plastic bag.   Place a towel between your skin and the bag.   Leave the ice on for 15-20 minutes, 3-4 times a day.   Do not use a donut-shaped pillow or sit on the toilet for long periods. This increases blood pooling and pain.  SEEK MEDICAL CARE IF:  You have increasing pain and swelling that is not controlled by treatment or medicine.  You have uncontrolled bleeding.  You have difficulty or you are unable to have a bowel movement.  You have pain or inflammation outside the area of the hemorrhoids. MAKE SURE YOU:  Understand these instructions.  Will watch your condition.  Will get help right away if you are not doing well or get worse. Document Released: 07/03/2000 Document Revised: 06/22/2012 Document Reviewed:  05/10/2012 [facility] Patient Information 2015 [place], [place]. This information is not intended to replace advice given to you by your health care provider. Make sure you discuss any questions you have with your health care provider.   Alcohol Use Disorder Alcohol use disorder is a mental disorder. It is not a one-time incident of heavy drinking. Alcohol use disorder is the excessive and uncontrollable use of alcohol over time that leads to problems with functioning in one or more areas of daily living. People with this disorder risk harming themselves and others when they drink to excess. Alcohol use disorder also can cause other mental disorders, such as mood and anxiety disorders, and serious physical problems. People with alcohol use disorder often misuse other drugs.  Alcohol use disorder is common and widespread. Some people with this disorder drink alcohol to cope with or escape from negative life events. Others drink to relieve chronic pain or symptoms of mental illness. People with a family history of alcohol use disorder are at higher risk of losing control and using alcohol to excess.  SYMPTOMS  Signs and symptoms of alcohol use disorder may include the following:   Consumption ofalcohol inlarger amounts or over a longer period of time than intended.  Multiple unsuccessful attempts to cutdown or control alcohol use.   A great deal of time spent obtaining alcohol, using alcohol, or recovering from the effects of alcohol (hangover).  A strong desire or urge to use alcohol (cravings).   Continued use of alcohol despite problems at work, school, or home because of alcohol use.   Continued use of alcohol despite problems in relationships because of alcohol use.  Continued use of alcohol in situations when it is physically hazardous, such as driving a car.  Continued use of alcohol despite awareness of a physical or psychological problem that is likely related to alcohol use.  Physical problems related to alcohol use can involve the brain, heart, liver, stomach, and intestines. Psychological problems related to alcohol use include intoxication, depression, anxiety, psychosis, delirium, and dementia.   The need for increased amounts of alcohol to achieve the same desired effect, or a decreased effect from the consumption of the same amount of alcohol (tolerance).  Withdrawal symptoms upon reducing or stopping alcohol use, or alcohol use to reduce or avoid withdrawal symptoms. Withdrawal symptoms include:  Racing heart.  Hand tremor.  Difficulty sleeping.  Nausea.  Vomiting.  Hallucinations.  Restlessness.  Seizures. DIAGNOSIS Alcohol use disorder is diagnosed through an assessment by your health care provider. Your health care provider may start by asking three or four questions to screen for excessive or problematic alcohol use. To confirm a diagnosis of alcohol use disorder, at least two symptoms must be present within a 63-month period. The severity of alcohol use disorder depends on the number of symptoms:  Mild--two or three.  Moderate--four or five.  Severe--six or more. Your health care provider may perform a physical exam or use results from lab tests to see if you have  physical problems resulting from alcohol use. Your health care provider may refer you to a mental health professional for evaluation. TREATMENT  Some people with alcohol use disorder are able to reduce their alcohol use to low-risk levels. Some people with alcohol use disorder need to quit drinking alcohol. When necessary, mental health professionals with specialized training in substance use treatment can help. Your health care provider can help you decide how severe your alcohol use disorder is and what type of treatment you need. The following forms of treatment are available:   Detoxification. Detoxification involves the use of prescription medicines to prevent alcohol  withdrawal symptoms in the first week after quitting. This is important for people with a history of symptoms of withdrawal and for heavy drinkers who are likely to have withdrawal symptoms. Alcohol withdrawal can be dangerous and, in severe cases, cause death. Detoxification is usually provided in a hospital or in-patient substance use treatment facility.  Counseling or talk therapy. Talk therapy is provided by substance use treatment counselors. It addresses the reasons people use alcohol and ways to keep them from drinking again. The goals of talk therapy are to help people with alcohol use disorder find healthy activities and ways to cope with life stress, to identify and avoid triggers for alcohol use, and to handle cravings, which can cause relapse.  Medicines.Different medicines can help treat alcohol use disorder through the following actions:  Decrease alcohol cravings.  Decrease the positive reward response felt from alcohol use.  Produce an uncomfortable physical reaction when alcohol is used (aversion therapy).  Support groups. Support groups are run by people who have quit drinking. They provide emotional support, advice, and guidance. These forms of treatment are often combined. Some people with alcohol use disorder benefit from intensive combination treatment provided by specialized substance use treatment centers. Both inpatient and outpatient treatment programs are available. Document Released: 08/13/2004 Document Revised: 11/20/2013 Document Reviewed: 10/13/2012 [facility] Patient Information 2015 [place], [place]. This information is not intended to replace advice given to you by your health care provider. Make sure you discuss any questions you have with your health care provider. Seizure, Adult A seizure is abnormal electrical activity in the brain. Seizures usually last from 30 seconds to 2 minutes. There are various types of seizures. Before a seizure, you may have a warning  sensation (aura) that a seizure is about to occur. An aura may include the following symptoms:  Fear or anxiety. Nausea. Feeling like the room is spinning (vertigo). Vision changes, such as seeing flashing lights or spots. Common symptoms during a seizure include: A change in attention or behavior (altered mental status). Convulsions with rhythmic jerking movements. Drooling. Rapid eye movements. Grunting. Loss of bladder and bowel control. Bitter taste in the mouth. Tongue biting. After a seizure, you may feel confused and sleepy. You may also have an injury resulting from convulsions during the seizure. HOME CARE INSTRUCTIONS  If you are given medicines, take them exactly as prescribed by your health care provider. Keep all follow-up appointments as directed by your health care provider. Do not swim or drive or engage in risky activity during which a seizure could cause further injury to you or others until your health care provider says it is OK. Get adequate rest. Teach friends and family what to do if you have a seizure. They should: Lay you on the ground to prevent a fall. Put a cushion under your head. Loosen any tight clothing around your neck. Turn you on your  side. If vomiting occurs, this helps keep your airway clear. Stay with you until you recover. Know whether or not you need emergency care. SEEK IMMEDIATE MEDICAL CARE IF: The seizure lasts longer than 5 minutes. The seizure is severe or you do not wake up immediately after the seizure. You have an altered mental status after the seizure. You are having more frequent or worsening seizures. Someone should drive you to the emergency department or call local emergency services (911 in [place] MAKE SURE YOU: Understand these instructions. Will watch your condition. Will get help right away if you are not doing well or get worse. Document Released: 07/03/2000 Document Revised: 04/26/2013 Document Reviewed:  02/15/2013 [facility] Patient Information 2015 [place], [place]. This information is not intended to replace advice given to you by your health care provider. Make sure you discuss any questions you have with your health care provider.

## 2014-05-18 ENCOUNTER — Encounter (HOSPITAL_COMMUNITY): Payer: Self-pay | Admitting: Emergency Medicine

## 2014-05-18 ENCOUNTER — Emergency Department (HOSPITAL_COMMUNITY)
Admission: EM | Admit: 2014-05-18 | Discharge: 2014-05-19 | Disposition: A | Discharge status: Other Acute Inpatient Hospital | Payer: Federal, State, Local not specified - Other | Attending: Emergency Medicine | Admitting: Emergency Medicine

## 2014-05-18 DIAGNOSIS — Z79899 Other long term (current) drug therapy: Secondary | ICD-10-CM | POA: Insufficient documentation

## 2014-05-18 DIAGNOSIS — F10239 Alcohol dependence with withdrawal, unspecified: Secondary | ICD-10-CM

## 2014-05-18 DIAGNOSIS — F1994 Other psychoactive substance use, unspecified with psychoactive substance-induced mood disorder: Secondary | ICD-10-CM | POA: Diagnosis present

## 2014-05-18 DIAGNOSIS — G40909 Epilepsy, unspecified, not intractable, without status epilepticus: Secondary | ICD-10-CM | POA: Insufficient documentation

## 2014-05-18 DIAGNOSIS — Z880 Allergy status to penicillin: Secondary | ICD-10-CM | POA: Insufficient documentation

## 2014-05-18 DIAGNOSIS — K644 Residual hemorrhoidal skin tags: Secondary | ICD-10-CM | POA: Insufficient documentation

## 2014-05-18 DIAGNOSIS — R45851 Suicidal ideations: Secondary | ICD-10-CM | POA: Insufficient documentation

## 2014-05-18 DIAGNOSIS — Z3202 Encounter for pregnancy test, result negative: Secondary | ICD-10-CM | POA: Insufficient documentation

## 2014-05-18 DIAGNOSIS — F1910 Other psychoactive substance abuse, uncomplicated: Secondary | ICD-10-CM

## 2014-05-18 DIAGNOSIS — Z8742 Personal history of other diseases of the female genital tract: Secondary | ICD-10-CM | POA: Insufficient documentation

## 2014-05-18 DIAGNOSIS — F1020 Alcohol dependence, uncomplicated: Secondary | ICD-10-CM | POA: Diagnosis present

## 2014-05-18 DIAGNOSIS — F329 Major depressive disorder, single episode, unspecified: Secondary | ICD-10-CM | POA: Diagnosis present

## 2014-05-18 DIAGNOSIS — K921 Melena: Secondary | ICD-10-CM | POA: Insufficient documentation

## 2014-05-18 DIAGNOSIS — F419 Anxiety disorder, unspecified: Secondary | ICD-10-CM | POA: Diagnosis present

## 2014-05-18 DIAGNOSIS — Z8679 Personal history of other diseases of the circulatory system: Secondary | ICD-10-CM | POA: Insufficient documentation

## 2014-05-18 DIAGNOSIS — D649 Anemia, unspecified: Secondary | ICD-10-CM | POA: Insufficient documentation

## 2014-05-18 LAB — COMPREHENSIVE METABOLIC PANEL
ALBUMIN: 4.1 g/dL (ref 3.5–5.2)
ALT: 62 U/L — ABNORMAL HIGH (ref 0–35)
AST: 126 U/L — AB (ref 0–37)
Alkaline Phosphatase: 130 U/L — ABNORMAL HIGH (ref 39–117)
Anion gap: 17 — ABNORMAL HIGH (ref 5–15)
BUN: 8 mg/dL (ref 6–23)
CALCIUM: 8.4 mg/dL (ref 8.4–10.5)
CO2: 24 mEq/L (ref 19–32)
CREATININE: 0.42 mg/dL — AB (ref 0.50–1.10)
Chloride: 104 mEq/L (ref 96–112)
GFR calc Af Amer: >90 mL/min (ref >90)
GFR calc non Af Amer: >90 mL/min (ref >90)
Glucose, Bld: 102 mg/dL — ABNORMAL HIGH (ref 70–99)
Potassium: 4 mEq/L (ref 3.7–5.3)
SODIUM: 145 meq/L (ref 137–147)
Total Bilirubin: 0.3 mg/dL (ref 0.3–1.2)
Total Protein: 7.3 g/dL (ref 6.0–8.3)

## 2014-05-18 LAB — ETHANOL: Alcohol, Ethyl (B): 435 mg/dL (ref 0–11)

## 2014-05-18 LAB — SALICYLATE LEVEL

## 2014-05-18 LAB — URINALYSIS, ROUTINE W REFLEX MICROSCOPIC
Bilirubin Urine: NEGATIVE
Glucose, UA: NEGATIVE mg/dL
Ketones, ur: NEGATIVE mg/dL
LEUKOCYTES UA: NEGATIVE
Nitrite: NEGATIVE
Protein, ur: NEGATIVE mg/dL
Specific Gravity, Urine: 1.006 (ref 1.005–1.030)
UROBILINOGEN UA: 0.2 mg/dL (ref 0.0–1.0)
pH: 6 (ref 5.0–8.0)

## 2014-05-18 LAB — CBC WITH DIFFERENTIAL/PLATELET
BASOS ABS: 0.1 10*3/uL (ref 0.0–0.1)
BASOS PCT: 2 % — AB (ref 0–1)
EOS PCT: 0 % (ref 0–5)
Eosinophils Absolute: 0 10*3/uL (ref 0.0–0.7)
HEMATOCRIT: 35.9 % — AB (ref 36.0–46.0)
Hemoglobin: 11.4 g/dL — ABNORMAL LOW (ref 12.0–15.0)
Lymphocytes Relative: 29 % (ref 12–46)
Lymphs Abs: 1.3 10*3/uL (ref 0.7–4.0)
MCH: 29.7 pg (ref 26.0–34.0)
MCHC: 31.8 g/dL (ref 30.0–36.0)
MCV: 93.5 fL (ref 78.0–100.0)
MONO ABS: 0.4 10*3/uL (ref 0.1–1.0)
Monocytes Relative: 8 % (ref 3–12)
Neutro Abs: 2.7 10*3/uL (ref 1.7–7.7)
Neutrophils Relative %: 61 % (ref 43–77)
PLATELETS: 317 10*3/uL (ref 150–400)
RBC: 3.84 MIL/uL — ABNORMAL LOW (ref 3.87–5.11)
RDW: 18.7 % — ABNORMAL HIGH (ref 11.5–15.5)
WBC: 4.4 10*3/uL (ref 4.0–10.5)

## 2014-05-18 LAB — RAPID URINE DRUG SCREEN, HOSP PERFORMED
AMPHETAMINES: NOT DETECTED
Barbiturates: NOT DETECTED
Benzodiazepines: NOT DETECTED
Cocaine: NOT DETECTED
Opiates: NOT DETECTED
Tetrahydrocannabinol: NOT DETECTED

## 2014-05-18 LAB — ACETAMINOPHEN LEVEL

## 2014-05-18 LAB — URINE MICROSCOPIC-ADD ON

## 2014-05-18 LAB — POC URINE PREG, ED: PREG TEST UR: NEGATIVE

## 2014-05-18 LAB — LIPASE, BLOOD: LIPASE: 32 U/L (ref 11–59)

## 2014-05-18 LAB — POC OCCULT BLOOD, ED: Fecal Occult Bld: POSITIVE — AB

## 2014-05-18 MED ORDER — ACETAMINOPHEN 325 MG PO TABS: 650.0000 mg | ORAL_TABLET | Freq: Four times a day (QID) | ORAL | Status: DC | PRN

## 2014-05-18 MED ORDER — FLUOXETINE HCL 20 MG PO CAPS
40.0000 mg | ORAL_CAPSULE | Freq: Every day | ORAL | Status: DC
  Administered 2014-05-19: 40 mg via ORAL
  Filled 2014-05-18: qty 2

## 2014-05-18 MED ORDER — LIDOCAINE HCL 2 % EX GEL
1.0000 "application " | Freq: Once | CUTANEOUS | Status: AC
  Administered 2014-05-18: 1
  Filled 2014-05-18: qty 10

## 2014-05-18 MED ORDER — FENTANYL CITRATE 0.05 MG/ML IJ SOLN
50.0000 ug | Freq: Once | INTRAMUSCULAR | Status: AC
  Administered 2014-05-18: 50 ug via INTRAVENOUS
  Filled 2014-05-18: qty 2

## 2014-05-18 MED ORDER — SODIUM CHLORIDE 0.9 % IV BOLUS (SEPSIS)
1000.0000 mL | INTRAVENOUS | Status: AC
  Administered 2014-05-18: 1000 mL via INTRAVENOUS

## 2014-05-18 MED ORDER — CARVEDILOL 3.125 MG PO TABS
1.5625 mg | ORAL_TABLET | Freq: Two times a day (BID) | ORAL | Status: DC
  Administered 2014-05-19 (×2): 1.5625 mg via ORAL
  Filled 2014-05-18 (×3): qty 0.5

## 2014-05-18 MED ORDER — AMMONIA AROMATIC IN INHA
RESPIRATORY_TRACT | Status: AC
  Filled 2014-05-18: qty 10

## 2014-05-18 MED ORDER — LORAZEPAM 1 MG PO TABS
0.0000 mg | ORAL_TABLET | Freq: Two times a day (BID) | ORAL | Status: DC
  Administered 2014-05-19: 2 mg via ORAL

## 2014-05-18 MED ORDER — TRAZODONE HCL 100 MG PO TABS: 100.0000 mg | ORAL_TABLET | Freq: Every evening | ORAL | Status: DC | PRN

## 2014-05-18 MED ORDER — LORAZEPAM 1 MG PO TABS
0.0000 mg | ORAL_TABLET | Freq: Four times a day (QID) | ORAL | Status: DC
  Administered 2014-05-19 (×2): 2 mg via ORAL
  Filled 2014-05-18 (×3): qty 2

## 2014-05-18 MED ORDER — LEVETIRACETAM 500 MG PO TABS
1000.0000 mg | ORAL_TABLET | Freq: Every day | ORAL | Status: DC
  Administered 2014-05-19: 1000 mg via ORAL
  Filled 2014-05-18: qty 2
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
## 2014-05-18 NOTE — ED Notes (Signed)
Pt adamantly denies SI/HI.

## 2014-05-18 NOTE — ED Notes (Signed)
Telepsych set up at bedside. 

## 2014-05-18 NOTE — ED Notes (Signed)
Assumed care of pt, pt A & O, no distress noted, pt does report pain to chest and rectum. Pt refuses offers of cream or suppositories for rectal pain relief. Pt stating Dilaudid is the only pain medication she can take that will not induce seizure activity. Pt states she was not suicidal that her husband is in [place], she had not been drinking but today was her son's birthday and she became depressed and drank a bottle of wine

## 2014-05-18 NOTE — ED Notes (Signed)
Pt resting on stretcher with eyes closed, RR even and unlabored. No distress noted.

## 2014-05-18 NOTE — ED Notes (Signed)
RN began giving fentanyl, IV and pt began having chest pain. While putting pt on 12 lead pt turned to the side and began  Having seizure like activity. Pt also began to drool at the mouth. An ammonia stick was used to help wake her up but pt held breath, respirations and O2 sat dropped. Pt eventually opened her eyes, A&Ox4, answering all questions appropriately. Pt moved to res B for monitoring.

## 2014-05-18 NOTE — Progress Notes (Signed)
  CARE MANAGEMENT ED NOTE 05/18/2014  Patient:  Kristen Roberson, Kristen Roberson   Account Number:  [id number]  Date Initiated:  05/18/2014  Documentation initiated by:  Livia Snellen  Subjective/Objective Assessment:   Patient IVC'd by her friend for SI.  Patient also reports having rectal bleeding with clots.     Subjective/Objective Assessment Detail:   Patient with history of ETOH abuse, hemorroids.  Patient noted to be inthe ED 35 times within the last six months. Alcohol level currently 435.  UA negative.  UDS negative.     Action/Plan:   TTS consult   Action/Plan Detail:   Anticipated DC Date:       Status Recommendation to Physician:   Result of Recommendation:    Other ED [place]  Other  PCP issues    Choice offered to / List presented to:            Status of service:  Completed, signed off  ED Comments:   ED Comments Detail:  EDCM spoke to patient at bedside.  Sitter at bedside. Patient noted to be in the ED 35 times within the last six months.  [facility] asked patient if she has followed up with [facility]?  Patient reports she went to [facility] last Thursday.  Patient reports she went to see her pcp at [facility] last week.  EDCM did not see appointment in [facility] for completed [place].  [facility] noted neurology appointment on 11/16 at 1030am.  Patient reports she will be going to that appointment and that she has already seen the neurologist but cannot remember when.  [facility] informed patient that ED was for emergency purposes and encouraged patient to follow up with pcp for continued wellness.  Patient stated, "I didn't bring myself here.  I want to go home.  [name] want to kill  myself.  I've just been under a lot of stress lately."  Discussed patient with EDRN.  TTS consult was placed.  No further EDCM needs at this time.

## 2014-05-18 NOTE — ED Notes (Signed)
Pt presents with depression and stating she was going to commit SI earlier tonight.  Denies at present.  IVC papers taken out by friend.  Pt reports she is going through a divorce after 42 yrs of marriage.  Her 3 children are with the husbands parents.  Pt reports she is a binge drinker, ingesting 1-2 bottles of wine.  Pt states she suffers from PTSD and depression.  Denies SI, HI or AV hallucinations, denies feeling hopeless.  Pt calm, cooperative, AAO x 3.  Will monitor for safety.

## 2014-05-18 NOTE — ED Notes (Signed)
Pt brought in by [facility] c/o lower abd/pelvic pain that has been going on for three days and pt states that she was crying to her friend, Lenice Pressman (who took out IVC papers), pt also states that she is having rectal bleeding, "lots of clots" for two days.   IVC papers state: been taking antidepressants and in June she was admitted for attempting to kill herself and again in September.  Today she stated that she was going to kill her and is danger to herself.
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
## 2014-05-18 NOTE — BH Assessment (Signed)
Assessment completed. Consulted Waylan Boga, NP who recommended that pt be evaluated by psychiatry in the morning. Dr. Aline Brochure has been informed of the recommendation.

## 2014-05-18 NOTE — ED Notes (Signed)
Staffing office and Wille Glaser, [facility] made aware of need for sitter.  Mellody Dance will send a sitter.

## 2014-05-18 NOTE — ED Notes (Signed)
Pt ambulatory with steady gait up and down hall and to nurses station.
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
## 2014-05-19 ENCOUNTER — Encounter (HOSPITAL_COMMUNITY): Payer: Self-pay | Admitting: *Deleted

## 2014-05-19 ENCOUNTER — Inpatient Hospital Stay (HOSPITAL_COMMUNITY)
Admission: AD | Admit: 2014-05-19 | Discharge: 2014-05-22 | DRG: 897 | Disposition: A | Discharge status: Home / Self Care | Payer: Federal, State, Local not specified - Other | Source: Intra-hospital | Attending: Psychiatry | Admitting: Psychiatry

## 2014-05-19 DIAGNOSIS — F411 Generalized anxiety disorder: Secondary | ICD-10-CM | POA: Diagnosis present

## 2014-05-19 DIAGNOSIS — Z599 Problem related to housing and economic circumstances, unspecified: Secondary | ICD-10-CM | POA: Diagnosis not present

## 2014-05-19 DIAGNOSIS — G40909 Epilepsy, unspecified, not intractable, without status epilepticus: Secondary | ICD-10-CM | POA: Diagnosis present

## 2014-05-19 DIAGNOSIS — F10239 Alcohol dependence with withdrawal, unspecified: Secondary | ICD-10-CM | POA: Diagnosis present

## 2014-05-19 DIAGNOSIS — K760 Fatty (change of) liver, not elsewhere classified: Secondary | ICD-10-CM | POA: Diagnosis present

## 2014-05-19 DIAGNOSIS — F4310 Post-traumatic stress disorder, unspecified: Secondary | ICD-10-CM | POA: Diagnosis present

## 2014-05-19 DIAGNOSIS — F1020 Alcohol dependence, uncomplicated: Secondary | ICD-10-CM | POA: Insufficient documentation

## 2014-05-19 DIAGNOSIS — F1014 Alcohol abuse with alcohol-induced mood disorder: Secondary | ICD-10-CM

## 2014-05-19 DIAGNOSIS — F331 Major depressive disorder, recurrent, moderate: Secondary | ICD-10-CM | POA: Insufficient documentation

## 2014-05-19 DIAGNOSIS — I10 Essential (primary) hypertension: Secondary | ICD-10-CM | POA: Diagnosis present

## 2014-05-19 MED ORDER — ALUM & MAG HYDROXIDE-SIMETH 200-200-20 MG/5ML PO SUSP: 30.0000 mL | ORAL | Status: DC | PRN

## 2014-05-19 MED ORDER — ACETAMINOPHEN 325 MG PO TABS: 650.0000 mg | ORAL_TABLET | Freq: Four times a day (QID) | ORAL | Status: DC | PRN

## 2014-05-19 MED ORDER — TRAZODONE HCL 100 MG PO TABS
100.0000 mg | ORAL_TABLET | Freq: Every evening | ORAL | Status: DC | PRN
  Administered 2014-05-20 – 2014-05-21 (×2): 100 mg via ORAL
  Filled 2014-05-19: qty 14
  Filled 2014-05-19 (×2): qty 1

## 2014-05-19 MED ORDER — LEVETIRACETAM 500 MG PO TABS
1000.0000 mg | ORAL_TABLET | Freq: Every day | ORAL | Status: DC
  Administered 2014-05-20 – 2014-05-22 (×3): 1000 mg via ORAL
  Filled 2014-05-19: qty 28
  Filled 2014-05-19 (×4): qty 2

## 2014-05-19 MED ORDER — ONDANSETRON HCL 4 MG PO TABS: 4.0000 mg | ORAL_TABLET | Freq: Four times a day (QID) | ORAL | Status: DC | PRN

## 2014-05-19 MED ORDER — LORAZEPAM 1 MG PO TABS: 0.0000 mg | ORAL_TABLET | Freq: Two times a day (BID) | ORAL | Status: DC

## 2014-05-19 MED ORDER — FLUOXETINE HCL 20 MG PO CAPS
40.0000 mg | ORAL_CAPSULE | Freq: Every day | ORAL | Status: DC
  Administered 2014-05-20 – 2014-05-22 (×3): 40 mg via ORAL
  Filled 2014-05-19 (×4): qty 2
  Filled 2014-05-19: qty 28

## 2014-05-19 MED ORDER — LORAZEPAM 1 MG PO TABS
0.0000 mg | ORAL_TABLET | Freq: Four times a day (QID) | ORAL | Status: DC
  Administered 2014-05-20: 2 mg via ORAL
  Administered 2014-05-20: 1 mg via ORAL
  Filled 2014-05-19: qty 2
  Filled 2014-05-19: qty 1

## 2014-05-19 MED ORDER — MAGNESIUM HYDROXIDE 400 MG/5ML PO SUSP: 30.0000 mL | Freq: Every day | ORAL | Status: DC | PRN

## 2014-05-19 MED ORDER — CARVEDILOL 3.125 MG PO TABS
1.5625 mg | ORAL_TABLET | Freq: Two times a day (BID) | ORAL | Status: DC
  Administered 2014-05-20 – 2014-05-22 (×5): 1.5625 mg via ORAL
  Filled 2014-05-19 (×2): qty 0.5
  Filled 2014-05-19: qty 14
  Filled 2014-05-19: qty 0.5
  Filled 2014-05-19: qty 14
  Filled 2014-05-19: qty 1
  Filled 2014-05-19 (×2): qty 0.5

## 2014-05-19 MED ORDER — ONDANSETRON HCL 4 MG PO TABS
4.0000 mg | ORAL_TABLET | Freq: Four times a day (QID) | ORAL | Status: DC | PRN
  Administered 2014-05-19: 4 mg via ORAL
  Filled 2014-05-19: qty 1

## 2014-05-19 NOTE — Progress Notes (Signed)
Psychoeducational Group Note  Date:  05/19/2014 Time:  2320  Group Topic/Focus:  Wrap-Up Group:   The focus of this group is to help patients review their daily goal of treatment and discuss progress on daily workbooks.  Participation Level: Did Not Attend  Participation Quality:  Not Applicable  Affect:  Not Applicable  Cognitive:  Not Applicable  Insight:  Not Applicable  Engagement in Group: Not Applicable  Additional Comments:  The patient did not attend the group since she was not admitted to the hallway until a few hours later.   Jadiel Schmieder S 05/19/2014, 11:20 PM
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
## 2014-05-20 DIAGNOSIS — F332 Major depressive disorder, recurrent severe without psychotic features: Secondary | ICD-10-CM

## 2014-05-20 DIAGNOSIS — F411 Generalized anxiety disorder: Secondary | ICD-10-CM

## 2014-05-20 MED ORDER — THIAMINE HCL 100 MG/ML IJ SOLN: 100.0000 mg | Freq: Once | INTRAMUSCULAR | Status: DC

## 2014-05-20 MED ORDER — ONDANSETRON 4 MG PO TBDP: 4.0000 mg | ORAL_TABLET | Freq: Four times a day (QID) | ORAL | Status: DC | PRN

## 2014-05-20 MED ORDER — VITAMIN B-1 100 MG PO TABS
100.0000 mg | ORAL_TABLET | Freq: Every day | ORAL | Status: DC
  Administered 2014-05-21 – 2014-05-22 (×2): 100 mg via ORAL
  Filled 2014-05-20 (×4): qty 1

## 2014-05-20 MED ORDER — LORAZEPAM 1 MG PO TABS: 1.0000 mg | ORAL_TABLET | Freq: Every day | ORAL | Status: DC

## 2014-05-20 MED ORDER — LOPERAMIDE HCL 2 MG PO CAPS: 2.0000 mg | ORAL_CAPSULE | ORAL | Status: DC | PRN

## 2014-05-20 MED ORDER — HYDROXYZINE HCL 25 MG PO TABS
25.0000 mg | ORAL_TABLET | Freq: Four times a day (QID) | ORAL | Status: DC | PRN
  Filled 2014-05-20: qty 30

## 2014-05-20 MED ORDER — LORAZEPAM 1 MG PO TABS
1.0000 mg | ORAL_TABLET | Freq: Two times a day (BID) | ORAL | Status: DC
  Filled 2014-05-20: qty 1

## 2014-05-20 MED ORDER — HYDROXYZINE HCL 25 MG PO TABS: 25.0000 mg | ORAL_TABLET | Freq: Four times a day (QID) | ORAL | Status: DC | PRN

## 2014-05-20 MED ORDER — ADULT MULTIVITAMIN W/MINERALS CH
1.0000 | ORAL_TABLET | Freq: Every day | ORAL | Status: DC
  Administered 2014-05-20 – 2014-05-22 (×3): 1 via ORAL
  Filled 2014-05-20 (×4): qty 1
  Filled 2014-05-20: qty 14

## 2014-05-20 MED ORDER — LORAZEPAM 1 MG PO TABS
1.0000 mg | ORAL_TABLET | Freq: Three times a day (TID) | ORAL | Status: DC
  Administered 2014-05-21 – 2014-05-22 (×2): 1 mg via ORAL
  Filled 2014-05-20: qty 1

## 2014-05-20 MED ORDER — LORAZEPAM 1 MG PO TABS
1.0000 mg | ORAL_TABLET | Freq: Four times a day (QID) | ORAL | Status: AC
  Administered 2014-05-20 – 2014-05-21 (×4): 1 mg via ORAL
  Filled 2014-05-20 (×3): qty 1

## 2014-05-20 MED ORDER — LORAZEPAM 1 MG PO TABS
1.0000 mg | ORAL_TABLET | Freq: Four times a day (QID) | ORAL | Status: DC | PRN
  Filled 2014-05-20: qty 1

## 2014-05-20 NOTE — Progress Notes (Signed)
Admission Note:  Patient is a 38 yr old female who presents voluntarily in no acute distress for the treatment of SI and Alcohol detox. Patient appears anxious but cooperative with admission process. Pt denies pain, SI/HI/AVH at this time. patient is minimal in giving out information but reports drinking alcohol excessively but couldn't say how much she drinks in a day. Patient's BAL was 435.  A:Skin was assessed, and found to be clear of any abnormal marks apart from a tattoo at the right ankle. POC and unit policies explained and understanding verbalized. Consents obtained. Food and fluids offered, and fluids accepted.  R: Pt had no additional questions or concerns. Requested "something" for anxiety and sleep. Will continue to monitor patient.
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
## 2014-05-20 NOTE — Progress Notes (Signed)
Patient ID: Kristen Roberson, female   DOB: 12/30/1975, 38 y.o.   MRN: [id number]   D: Pt has been appropriate on the unit today, she has attended all groups and engaged in treatment. Pt reported that she is just holding up space here at [facility] and that she just needs to be discharged so her bed can go to someone that needs help. Pt reported that she has a job interview on Tuesday, and that she needs to get to work. Pt has taken all medications without any problems, her withdrawals have been managed with the Ativan protocol. Pt reported being negative SI/HI, no AH/VH noted. A: 15 min checks continued for patient safety. R: Pt safety maintained.
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
## 2014-05-20 NOTE — BHH Group Notes (Signed)
[facility] LCSW Group Therapy 05/20/2014 10:00am  Pt did not attend, sleeping in room.  Peri Maris, [place] 05/20/2014 12:29 PM

## 2014-05-20 NOTE — Progress Notes (Signed)
[facility] Group Notes:  (Nursing/MHT/Case Management/Adjunct)  Date:  05/20/2014  Time:  10:38 PM  Type of Therapy:  Psychoeducational Skills  Participation Level:  Minimal  Participation Quality:  Attentive  Affect:  Depressed  Cognitive:  Lacking  Insight:  Lacking  Engagement in Group:  Limited  Modes of Intervention:  Education  Summary of Progress/Problems: The patient indicated in group that she felt "bored" for much of the day. The patient would prefer to have more groups and or activities on the unit. She anticipates being discharged tomorrow. As a theme for the day, her support system will be comprised of her fiance and best friend.   Archie Balboa S 05/20/2014, 10:38 PM

## 2014-05-20 NOTE — BHH Suicide Risk Assessment (Signed)
   Nursing information obtained from:    Demographic factors:   38 year old caucasian female, separated  Current Mental Status:   See below Loss Factors:   Separation, financial difficulties, children currently with extended family Historical Factors:   Alcohol Dependence. Depression Risk Reduction Factors:   Resilience , states about to start new job soon, sense of responsibility to family Total Time spent with patient: 45 minutes  CLINICAL FACTORS:  Alcohol Dependence, Depression  Psychiatric Specialty Exam: Physical Exam  ROS  Blood pressure 117/80, pulse 101, temperature 98 F (36.7 C), temperature source Oral, resp. rate 16, height 5\' 5"  (1.651 m), weight 61.236 kg (135 lb), last menstrual period 03/26/2014, SpO2 99 %.Body mass index is 22.47 kg/(m^2).  General Appearance: Well Groomed  Engineer, water::  Good  Speech:  Normal Rate  Volume:  Normal  Mood:  Depressed  Affect:  constricted but reactive, and does smile often and appropriately  Thought Process:  Goal Directed and Linear  Orientation:  Other:  fully alert and attentive,   Thought Content:  no hallucinations,no delusions,   Suicidal Thoughts:  No-at this time denies any plan or intention of hurting self and contracts for safety on unit   Homicidal Thoughts:  No  Memory:  recent and remote grossly intact   Judgement:  Fair  Insight:  Present  Psychomotor Activity:  Normal  Concentration:  Good  Recall:  Good  Fund of Knowledge:Good  Language: Good  Akathisia:  Negative  Handed:  Right  AIMS (if indicated):     Assets:  Communication Skills Desire for Improvement Resilience  Sleep:  Number of Hours: 6   Musculoskeletal: Strength & Muscle Tone: within normal limits- has subtle distal tremors but does not present in any acute withdrawal or distress. No diaphoresis. Gait & Station: normal Patient leans: N/A  COGNITIVE FEATURES THAT CONTRIBUTE TO RISK:  Closed-mindedness    SUICIDE RISK:   Moderate:   Frequent suicidal ideation with limited intensity, and duration, some specificity in terms of plans, no associated intent, good self-control, limited dysphoria/symptomatology, some risk factors present, and identifiable protective factors, including available and accessible social support.  PLAN OF CARE: Patient will be admitted to inpatient psychiatric unit for stabilization and safety. Will provide and encourage milieu participation. Provide medication management and maked adjustments as needed. Will also provide medication management to address potential alcohol withdrawal.   Will follow daily.    I certify that inpatient services furnished can reasonably be expected to improve the patient's condition.  Tripton Ned, [place] 05/20/2014, 4:07 PM

## 2014-05-21 ENCOUNTER — Encounter (HOSPITAL_COMMUNITY): Payer: Self-pay | Admitting: *Deleted

## 2014-05-21 DIAGNOSIS — F4310 Post-traumatic stress disorder, unspecified: Secondary | ICD-10-CM

## 2014-05-21 DIAGNOSIS — F1020 Alcohol dependence, uncomplicated: Secondary | ICD-10-CM | POA: Insufficient documentation

## 2014-05-21 DIAGNOSIS — F1099 Alcohol use, unspecified with unspecified alcohol-induced disorder: Secondary | ICD-10-CM

## 2014-05-21 DIAGNOSIS — F331 Major depressive disorder, recurrent, moderate: Secondary | ICD-10-CM | POA: Insufficient documentation

## 2014-05-21 LAB — TSH: TSH: 3.13 u[IU]/mL (ref 0.350–4.500)

## 2014-05-21 MED ORDER — CLONAZEPAM 0.5 MG PO TABS: 0.5000 mg | ORAL_TABLET | Freq: Three times a day (TID) | ORAL | Status: DC | PRN

## 2014-05-21 MED ORDER — FOLIC ACID 1 MG PO TABS: 1.0000 mg | ORAL_TABLET | Freq: Every day | ORAL | Status: DC

## 2014-05-21 MED ORDER — FLUOXETINE HCL 40 MG PO CAPS: 40.0000 mg | ORAL_CAPSULE | Freq: Every day | ORAL | Status: DC

## 2014-05-21 MED ORDER — TRAZODONE HCL 100 MG PO TABS: 100.0000 mg | ORAL_TABLET | Freq: Every evening | ORAL | Status: DC | PRN

## 2014-05-21 MED ORDER — LEVETIRACETAM 1000 MG PO TABS: 1000.0000 mg | ORAL_TABLET | Freq: Every day | ORAL | Status: DC

## 2014-05-21 MED ORDER — FOLIC ACID 1 MG PO TABS
1.0000 mg | ORAL_TABLET | Freq: Every day | ORAL | Status: DC
  Filled 2014-05-21: qty 14

## 2014-05-21 MED ORDER — HYDROXYZINE HCL 25 MG PO TABS: 25.0000 mg | ORAL_TABLET | Freq: Four times a day (QID) | ORAL | Status: DC | PRN

## 2014-05-21 MED ORDER — CARVEDILOL 3.125 MG PO TABS
1.5625 mg | ORAL_TABLET | Freq: Two times a day (BID) | ORAL | Status: DC
Start: 2014-05-21 — End: 2014-05-22
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
## 2014-05-21 NOTE — Progress Notes (Signed)
D: Pt has anxious affect and mood.  Pt reported that she is hoping to discharge tomorrow because she has a job interview on Tuesday.  Pt denies SI/HI, denies hallucinations.  Pt interacts with peers and staff appropriately.  Pt attended evening group.   A: Medications administered per order.  PRN medication administered for insomnia, see flowsheet.  Safety maintained.  Met with pt 1:1 and offered support and encouragement.   R: Pt is compliant with medications.  She verbally contracts for safety and is in no distress.  Will continue to monitor and assess for safety.

## 2014-05-21 NOTE — Plan of Care (Signed)
Problem: Ineffective individual coping Goal: STG: Patient will remain free from self harm Outcome: Progressing Pt has not harmed herself this shift.  She verbally contracted for safety.    Problem: Alteration in mood & ability to function due to Goal: LTG-Pt reports reduction in suicidal thoughts (Patient reports reduction in suicidal thoughts and is able to verbalize a safety plan for whenever patient is feeling suicidal)  Outcome: Progressing Pt denied SI this shift.    Problem: Diagnosis: Increased Risk For Suicide Attempt Goal: STG-Patient Will Attend All Groups On The Unit Outcome: Progressing Pt attended evening group on 05/20/14.

## 2014-05-21 NOTE — Plan of Care (Signed)
Problem: Alteration in mood & ability to function due to Goal: STG: Patient verbalizes decreases in signs of withdrawal Outcome: Progressing Patient denies all withdrawal. Expresses desire for discharge soon.   Problem: Diagnosis: Increased Risk For Suicide Attempt Goal: STG-Patient Will Comply With Medication Regime Outcome: Progressing Compliant with meds.

## 2014-05-21 NOTE — Progress Notes (Signed)
Patient is smiling and relaxed this morning. Denying all signs of withdrawal, no pain. CIWA "0." Also denies all psychiatric complaints. Asking about discharge as she indicates she has an interview in the morning at 0900. Medicated per orders. Support and encouragement given. Discussed plan of care with MD. Spoke with patient regarding the importance of staying med compliant upon discharge. Patient assures this RN she will be as she will seize otherwise. Her goal today is to stay positive. Denies SI/HI and remains safe. Jamie Kato
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
## 2014-05-21 NOTE — BHH Suicide Risk Assessment (Signed)
   Demographic Factors:  Caucasian, Unemployed and going through divorce  Total Time spent with patient: 45 minutes  Psychiatric Specialty Exam: Physical Exam  ROS  Blood pressure 112/72, pulse 98, temperature 97.5 F (36.4 C), temperature source Oral, resp. rate 16, height 5\' 5"  (1.651 m), weight 61.236 kg (135 lb), last menstrual period 03/26/2014, SpO2 99 %.Body mass index is 22.47 kg/(m^2).  General Appearance: Casual  Eye Contact::  Good  Speech:  Clear and Coherent  Volume:  Normal  Mood:  Euthymic  Affect:  Appropriate  Thought Process:  Coherent  Orientation:  Full (Time, Place, and Person)  Thought Content:  WDL  Suicidal Thoughts:  No  Homicidal Thoughts:  No  Memory:  Immediate;   Good Recent;   Good Remote;   Good  Judgement:  Fair  Insight:  Fair  Psychomotor Activity:  Normal  Concentration:  Fair  Recall:  [place] of Knowledge:Good  Language: Good  Akathisia:  No  Handed:  Right  AIMS (if indicated):     Assets:  Communication Skills  Sleep:  Number of Hours: 6.75    Musculoskeletal: Strength & Muscle Tone: within normal limits Gait & Station: normal Patient leans: N/A   Mental Status Per Nursing Assessment::   On Admission:     Current Mental Status by Physician: Patient appears to be calm ,denies any mood sx ,sadness ,SI/HI/AH/VH.Pt lacks insight in to her substance use .  Loss Factors: Loss of significant relationship and Financial problems/change in socioeconomic status  Historical Factors: Impulsivity  Risk Reduction Factors:   She is currently looking for a job and wants to follow up with outpatient psychiatrist. Patient reports she had been through a lot of stressors the previous 6 months and wants to get better. Wants to get a job and take care of her children.  Continued Clinical Symptoms:  Alcohol/Substance Abuse/Dependencies Medical Diagnoses and Treatments/Surgeries  Cognitive Features That Contribute To Risk:   Closed-mindedness Polarized thinking    Suicide Risk:  Acute risk - Minimal: No identifiable suicidal ideation.   Chronic risk is moderate -since she is going through divorce ,has been noncompliant on medications including her seizure medications,is white ,unemployed.  Discharge Diagnoses:  DSM5: Primary Psychiatric Diagnosis: Major depressive disorder ,recurrent ,moderate    Secondary Psychiatric Diagnosis: Alcohol use disorder ,severe PTSD per history  Non Psychiatric Diagnosis: Seizure disorder  Past Medical History  Diagnosis Date  . Proctitis   . Cysts of both ovaries   . Anemia   . Anxiety   . Blood transfusion without reported diagnosis   . Depression   . Fatty liver 10/05/13  . Cardiac arrest   . Seizures    Plan Of Care/Follow-up recommendations:  Activity:  no restrictions Diet:  regular  Is patient on multiple antipsychotic therapies at discharge:  No   Has Patient had three or more failed trials of antipsychotic monotherapy by history:  No  Recommended Plan for Multiple Antipsychotic Therapies: NA    Samyak Sackmann MD 05/21/2014, 3:44 PM

## 2014-05-21 NOTE — BHH Group Notes (Signed)
[facility] LCSW Group Therapy  05/21/2014 1:15 pm  Type of Therapy: Process Group Therapy  Participation Level:  Active  Participation Quality:  Appropriate  Affect:  Flat  Cognitive:  Oriented  Insight:  Improving  Engagement in Group:  Limited  Engagement in Therapy:  Limited  Modes of Intervention:  Activity, Clarification, Education, Problem-solving and Support  Summary of Progress/Problems: Today's group addressed the issue of overcoming obstacles.  Patients were asked to identify their biggest obstacle post d/c that stands in the way of their on-going success, and then problem solve as to how to manage this.  Kaelyn was engaged and active throughout.  She identifed an overcome obstacle was her fear of living alone.  And this is just new, since she had this fear prior to admissiin.  When pressed, she decided it is because she has benefited from being around others who understand what it is like to go through what she has been going through, and she does not feel judged here.  "I feel like I can take on anything now."  She talked about her obstacle of seizures, and stated her plan to address that is to get a roommate.  In the meantime, she hopes to get the job she is interveiwing for tomorrow "because then I will be busy and distracted so that I will not worry as much."  Roque Lias B 05/21/2014   4:04 PM

## 2014-05-21 NOTE — BHH Suicide Risk Assessment (Signed)
[facility] INPATIENT:  Family/Significant Other Suicide Prevention Education  Suicide Prevention Education:  Contact Attempts: Lenice Pressman (Pt's friend) [phone] has been identified by the patient as the family member/significant other with whom the patient will be residing, and identified as the person(s) who will aid the patient in the event of a mental health crisis.  With written consent from the patient, two attempts were made to provide suicide prevention education, prior to and/or following the patient's discharge.  We were unsuccessful in providing suicide prevention education.  A suicide education pamphlet was given to the patient to share with family/significant other.  Date and time of first attempt: 10:00AM 05/21/14 Date and time of second attempt: 1:10PM 05/21/14 (generic voicemail left requesting call back at earliest convenience)  Smart, Brandon Glasgow  05/21/2014, 1:11 PM

## 2014-05-22 DIAGNOSIS — F1020 Alcohol dependence, uncomplicated: Secondary | ICD-10-CM

## 2014-05-22 MED ORDER — CARVEDILOL 3.125 MG PO TABS: 1.5625 mg | ORAL_TABLET | Freq: Two times a day (BID) | ORAL | Status: DC

## 2014-05-22 MED ORDER — FOLIC ACID 1 MG PO TABS: 1.0000 mg | ORAL_TABLET | Freq: Every day | ORAL | Status: DC

## 2014-05-22 NOTE — Plan of Care (Signed)
Problem: Ineffective individual coping Goal: LTG: Patient will report a decrease in negative feelings Outcome: Completed/Met Date Met:  05/21/14 Pt states she feels better and is looking for to discharg

## 2014-05-22 NOTE — Progress Notes (Signed)
DISCHARGE NOTE: D: Patient alert and oriented upon discharge. Pt was in stable condition and ambulatory with a steady gait. Pt denies SI/HI and AVH. Pt stated, in reference to the hospital staff "you guys were great." A: AVS reviewed and given to pt. Medications/Prescriptions given to pt. Follow up reviewed with pt. Pt was given time to ask questions and express concerns. Belongings returned to pt.  R: Pt D/C'd to friend. Delray Alt, RN
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
## 2014-05-22 NOTE — Progress Notes (Signed)
D: Patient alert and oriented. Pts mood is pleasant and affect is appropriate. Pt denies SI/HI and AVH at this time. Pt states she is ready to leave today. A: Medications administered per providers orders (See MAR). 15 minute checks completed per protocol for patient safety. R: Pt receptive and cooperative to nursing interventions. Delray Alt, RN

## 2014-05-25 NOTE — Progress Notes (Signed)
Patient Discharge Instructions:  After Visit Summary (AVS):   Faxed to:  05/25/14 Discharge Summary Note:   Faxed to:  05/25/14 Psychiatric Admission Assessment Note:   Faxed to:  05/25/14 Suicide Risk Assessment - Discharge Assessment:   Faxed to:  05/25/14 Faxed/Sent to the Next Level Care provider:  05/25/14 Faxed to [facility], 05/25/2014, 3:37 PM

## 2014-05-28 ENCOUNTER — Emergency Department (HOSPITAL_COMMUNITY)
Admission: EM | Admit: 2014-05-28 | Discharge: 2014-05-28 | Disposition: A | Discharge status: Home / Self Care | Payer: Self-pay | Attending: Emergency Medicine | Admitting: Emergency Medicine

## 2014-05-28 ENCOUNTER — Emergency Department (HOSPITAL_COMMUNITY): Payer: Self-pay

## 2014-05-28 ENCOUNTER — Encounter (HOSPITAL_COMMUNITY): Payer: Self-pay | Admitting: Emergency Medicine

## 2014-05-28 DIAGNOSIS — S31133A Puncture wound of abdominal wall without foreign body, right lower quadrant without penetration into peritoneal cavity, initial encounter: Secondary | ICD-10-CM | POA: Insufficient documentation

## 2014-05-28 DIAGNOSIS — F329 Major depressive disorder, single episode, unspecified: Secondary | ICD-10-CM | POA: Insufficient documentation

## 2014-05-28 DIAGNOSIS — Z79899 Other long term (current) drug therapy: Secondary | ICD-10-CM | POA: Insufficient documentation

## 2014-05-28 DIAGNOSIS — Y9289 Other specified places as the place of occurrence of the external cause: Secondary | ICD-10-CM | POA: Insufficient documentation

## 2014-05-28 DIAGNOSIS — Z8674 Personal history of sudden cardiac arrest: Secondary | ICD-10-CM | POA: Insufficient documentation

## 2014-05-28 DIAGNOSIS — Z880 Allergy status to penicillin: Secondary | ICD-10-CM | POA: Insufficient documentation

## 2014-05-28 DIAGNOSIS — D649 Anemia, unspecified: Secondary | ICD-10-CM | POA: Insufficient documentation

## 2014-05-28 DIAGNOSIS — W1830XA Fall on same level, unspecified, initial encounter: Secondary | ICD-10-CM | POA: Insufficient documentation

## 2014-05-28 DIAGNOSIS — Y9389 Activity, other specified: Secondary | ICD-10-CM | POA: Insufficient documentation

## 2014-05-28 DIAGNOSIS — W260XXA Contact with knife, initial encounter: Secondary | ICD-10-CM | POA: Insufficient documentation

## 2014-05-28 DIAGNOSIS — G40909 Epilepsy, unspecified, not intractable, without status epilepticus: Secondary | ICD-10-CM | POA: Insufficient documentation

## 2014-05-28 DIAGNOSIS — T148XXA Other injury of unspecified body region, initial encounter: Secondary | ICD-10-CM

## 2014-05-28 DIAGNOSIS — Z8742 Personal history of other diseases of the female genital tract: Secondary | ICD-10-CM | POA: Insufficient documentation

## 2014-05-28 DIAGNOSIS — Y998 Other external cause status: Secondary | ICD-10-CM | POA: Insufficient documentation

## 2014-05-28 DIAGNOSIS — Z8719 Personal history of other diseases of the digestive system: Secondary | ICD-10-CM | POA: Insufficient documentation

## 2014-05-28 DIAGNOSIS — S31132A Puncture wound of abdominal wall without foreign body, epigastric region without penetration into peritoneal cavity, initial encounter: Secondary | ICD-10-CM | POA: Insufficient documentation

## 2014-05-28 DIAGNOSIS — F419 Anxiety disorder, unspecified: Secondary | ICD-10-CM | POA: Insufficient documentation

## 2014-05-28 DIAGNOSIS — T1490XA Injury, unspecified, initial encounter: Secondary | ICD-10-CM

## 2014-05-28 LAB — I-STAT CHEM 8, ED
BUN: 7 mg/dL (ref 6–23)
CALCIUM ION: 0.99 mmol/L — AB (ref 1.12–1.23)
Chloride: 105 mEq/L (ref 96–112)
Creatinine, Ser: 0.9 mg/dL (ref 0.50–1.10)
Glucose, Bld: 101 mg/dL — ABNORMAL HIGH (ref 70–99)
HEMATOCRIT: 37 % (ref 36.0–46.0)
HEMOGLOBIN: 12.6 g/dL (ref 12.0–15.0)
POTASSIUM: 5.4 meq/L — AB (ref 3.7–5.3)
Sodium: 139 mEq/L (ref 137–147)
TCO2: 24 mmol/L (ref 0–100)

## 2014-05-28 MED ORDER — LIDOCAINE-EPINEPHRINE 1 %-1:100000 IJ SOLN
INTRAMUSCULAR | Status: AC
  Filled 2014-05-28: qty 1

## 2014-05-28 MED ORDER — LIDOCAINE-EPINEPHRINE 1 %-1:100000 IJ SOLN: 10.0000 mL | Freq: Once | INTRAMUSCULAR | Status: DC

## 2014-05-28 MED ORDER — NAPROXEN 500 MG PO TABS
500.0000 mg | ORAL_TABLET | Freq: Two times a day (BID) | ORAL | Status: DC
  Filled 2014-05-28: qty 1

## 2014-05-28 MED ORDER — KETOROLAC TROMETHAMINE 30 MG/ML IJ SOLN
30.0000 mg | Freq: Once | INTRAMUSCULAR | Status: AC
  Administered 2014-05-28: 30 mg via INTRAVENOUS
  Filled 2014-05-28: qty 1

## 2014-05-28 MED ORDER — IOHEXOL 300 MG/ML  SOLN
100.0000 mL | Freq: Once | INTRAMUSCULAR | Status: AC | PRN
  Administered 2014-05-28: 100 mL via INTRAVENOUS
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
## 2014-06-04 ENCOUNTER — Encounter (HOSPITAL_COMMUNITY): Payer: Self-pay | Admitting: Emergency Medicine

## 2014-06-04 ENCOUNTER — Emergency Department (HOSPITAL_COMMUNITY)
Admission: EM | Admit: 2014-06-04 | Discharge: 2014-06-04 | Disposition: A | Discharge status: Home / Self Care | Payer: MEDICAID | Attending: Emergency Medicine | Admitting: Emergency Medicine

## 2014-06-04 ENCOUNTER — Ambulatory Visit: Payer: Self-pay | Admitting: Neurology

## 2014-06-04 DIAGNOSIS — G40909 Epilepsy, unspecified, not intractable, without status epilepticus: Secondary | ICD-10-CM | POA: Insufficient documentation

## 2014-06-04 DIAGNOSIS — F329 Major depressive disorder, single episode, unspecified: Secondary | ICD-10-CM | POA: Insufficient documentation

## 2014-06-04 DIAGNOSIS — F10220 Alcohol dependence with intoxication, uncomplicated: Secondary | ICD-10-CM

## 2014-06-04 DIAGNOSIS — Z880 Allergy status to penicillin: Secondary | ICD-10-CM | POA: Insufficient documentation

## 2014-06-04 DIAGNOSIS — Z8742 Personal history of other diseases of the female genital tract: Secondary | ICD-10-CM | POA: Insufficient documentation

## 2014-06-04 DIAGNOSIS — Z8674 Personal history of sudden cardiac arrest: Secondary | ICD-10-CM | POA: Insufficient documentation

## 2014-06-04 DIAGNOSIS — Z8719 Personal history of other diseases of the digestive system: Secondary | ICD-10-CM | POA: Insufficient documentation

## 2014-06-04 DIAGNOSIS — F32A Depression, unspecified: Secondary | ICD-10-CM

## 2014-06-04 DIAGNOSIS — F419 Anxiety disorder, unspecified: Secondary | ICD-10-CM | POA: Insufficient documentation

## 2014-06-04 DIAGNOSIS — F10229 Alcohol dependence with intoxication, unspecified: Secondary | ICD-10-CM | POA: Insufficient documentation

## 2014-06-04 DIAGNOSIS — Z79899 Other long term (current) drug therapy: Secondary | ICD-10-CM | POA: Insufficient documentation

## 2014-06-04 DIAGNOSIS — D649 Anemia, unspecified: Secondary | ICD-10-CM | POA: Insufficient documentation

## 2014-06-04 LAB — COMPREHENSIVE METABOLIC PANEL
ALBUMIN: 4.4 g/dL (ref 3.5–5.2)
ALT: 62 U/L — AB (ref 0–35)
AST: 126 U/L — ABNORMAL HIGH (ref 0–37)
Alkaline Phosphatase: 118 U/L — ABNORMAL HIGH (ref 39–117)
Anion gap: 16 — ABNORMAL HIGH (ref 5–15)
BUN: 11 mg/dL (ref 6–23)
CALCIUM: 8.7 mg/dL (ref 8.4–10.5)
CO2: 24 mEq/L (ref 19–32)
CREATININE: 0.47 mg/dL — AB (ref 0.50–1.10)
Chloride: 99 mEq/L (ref 96–112)
GFR calc Af Amer: >90 mL/min (ref >90)
Glucose, Bld: 129 mg/dL — ABNORMAL HIGH (ref 70–99)
Potassium: 3.6 mEq/L — ABNORMAL LOW (ref 3.7–5.3)
SODIUM: 139 meq/L (ref 137–147)
Total Bilirubin: 0.9 mg/dL (ref 0.3–1.2)
Total Protein: 7.8 g/dL (ref 6.0–8.3)

## 2014-06-04 LAB — ACETAMINOPHEN LEVEL: Acetaminophen (Tylenol), Serum: <15 ug/mL (ref 10–30)

## 2014-06-04 LAB — CBC
HCT: 37.1 % (ref 36.0–46.0)
Hemoglobin: 12.5 g/dL (ref 12.0–15.0)
MCH: 30.2 pg (ref 26.0–34.0)
MCHC: 33.7 g/dL (ref 30.0–36.0)
MCV: 89.6 fL (ref 78.0–100.0)
PLATELETS: 196 10*3/uL (ref 150–400)
RBC: 4.14 MIL/uL (ref 3.87–5.11)
RDW: 18.3 % — AB (ref 11.5–15.5)
WBC: 3.3 10*3/uL — ABNORMAL LOW (ref 4.0–10.5)

## 2014-06-04 LAB — ETHANOL: ALCOHOL ETHYL (B): 406 mg/dL — AB (ref 0–11)

## 2014-06-04 LAB — SALICYLATE LEVEL

## 2014-06-04 NOTE — Discharge Instructions (Signed)
Alcohol Use Disorder °Alcohol use disorder is a mental disorder. It is not a one-time incident of heavy drinking. Alcohol use disorder is the excessive and uncontrollable use of alcohol over time that leads to problems with functioning in one or more areas of daily living. People with this disorder risk harming themselves and others when they drink to excess. Alcohol use disorder also can cause other mental disorders, such as mood and anxiety disorders, and serious physical problems. People with alcohol use disorder often misuse other drugs.  °Alcohol use disorder is common and widespread. Some people with this disorder drink alcohol to cope with or escape from negative life events. Others drink to relieve chronic pain or symptoms of mental illness. People with a family history of alcohol use disorder are at higher risk of losing control and using alcohol to excess.  °SYMPTOMS  °Signs and symptoms of alcohol use disorder may include the following:  °· Consumption of alcohol in larger amounts or over a longer period of time than intended. °· Multiple unsuccessful attempts to cut down or control alcohol use.   °· A great deal of time spent obtaining alcohol, using alcohol, or recovering from the effects of alcohol (hangover). °· A strong desire or urge to use alcohol (cravings).   °· Continued use of alcohol despite problems at work, school, or home because of alcohol use.   °· Continued use of alcohol despite problems in relationships because of alcohol use. °· Continued use of alcohol in situations when it is physically hazardous, such as driving a car. °· Continued use of alcohol despite awareness of a physical or psychological problem that is likely related to alcohol use. Physical problems related to alcohol use can involve the brain, heart, liver, stomach, and intestines. Psychological problems related to alcohol use include intoxication, depression, anxiety, psychosis, delirium, and dementia.   °· The need for  increased amounts of alcohol to achieve the same desired effect, or a decreased effect from the consumption of the same amount of alcohol (tolerance). °· Withdrawal symptoms upon reducing or stopping alcohol use, or alcohol use to reduce or avoid withdrawal symptoms. Withdrawal symptoms include: °¨ Racing heart. °¨ Hand tremor. °¨ Difficulty sleeping. °¨ Nausea. °¨ Vomiting. °¨ Hallucinations. °¨ Restlessness. °¨ Seizures. °DIAGNOSIS °Alcohol use disorder is diagnosed through an assessment by your health care provider. Your health care provider may start by asking three or four questions to screen for excessive or problematic alcohol use. To confirm a diagnosis of alcohol use disorder, at least two symptoms must be present within a 12-month period. The severity of alcohol use disorder depends on the number of symptoms: °· Mild--two or three. °· Moderate--four or five. °· Severe--six or more. °Your health care provider may perform a physical exam or use results from lab tests to see if you have physical problems resulting from alcohol use. Your health care provider may refer you to a mental health professional for evaluation. °TREATMENT  °Some people with alcohol use disorder are able to reduce their alcohol use to low-risk levels. Some people with alcohol use disorder need to quit drinking alcohol. When necessary, mental health professionals with specialized training in substance use treatment can help. Your health care provider can help you decide how severe your alcohol use disorder is and what type of treatment you need. The following forms of treatment are available:  °· Detoxification. Detoxification involves the use of prescription medicines to prevent alcohol withdrawal symptoms in the first week after quitting. This is important for people with a history of symptoms   of withdrawal and for heavy drinkers who are likely to have withdrawal symptoms. Alcohol withdrawal can be dangerous and, in severe cases, cause  death. Detoxification is usually provided in a hospital or in-patient substance use treatment facility.  Counseling or talk therapy. Talk therapy is provided by substance use treatment counselors. It addresses the reasons people use alcohol and ways to keep them from drinking again. The goals of talk therapy are to help people with alcohol use disorder find healthy activities and ways to cope with life stress, to identify and avoid triggers for alcohol use, and to handle cravings, which can cause relapse.  Medicines.Different medicines can help treat alcohol use disorder through the following actions:  Decrease alcohol cravings.  Decrease the positive reward response felt from alcohol use.  Produce an uncomfortable physical reaction when alcohol is used (aversion therapy).  Support groups. Support groups are run by people who have quit drinking. They provide emotional support, advice, and guidance. These forms of treatment are often combined. Some people with alcohol use disorder benefit from intensive combination treatment provided by specialized substance use treatment centers. Both inpatient and outpatient treatment programs are available. Document Released: 08/13/2004 Document Revised: 11/20/2013 Document Reviewed: 10/13/2012 [facility] Patient Information 2015 [place], [place]. This information is not intended to replace advice given to you by your health care provider. Make sure you discuss any questions you have with your health care provider. Depression Depression refers to feeling sad, low, down in the dumps, blue, gloomy, or empty. In general, there are two kinds of depression: Normal sadness or normal grief. This kind of depression is one that we all feel from time to time after upsetting life experiences, such as the loss of a job or the ending of a relationship. This kind of depression is considered normal, is short lived, and resolves within a few days to 2 weeks. Depression experienced  after the loss of a loved one (bereavement) often lasts longer than 2 weeks but normally gets better with time. Clinical depression. This kind of depression lasts longer than normal sadness or normal grief or interferes with your ability to function at home, at work, and in school. It also interferes with your personal relationships. It affects almost every aspect of your life. Clinical depression is an illness. Symptoms of depression can also be caused by conditions other than those mentioned above, such as: Physical illness. Some physical illnesses, including underactive thyroid gland (hypothyroidism), severe anemia, specific types of cancer, diabetes, uncontrolled seizures, heart and lung problems, strokes, and chronic pain are commonly associated with symptoms of depression. Side effects of some prescription medicine. In some people, certain types of medicine can cause symptoms of depression. Substance abuse. Abuse of alcohol and illicit drugs can cause symptoms of depression. SYMPTOMS Symptoms of normal sadness and normal grief include the following: Feeling sad or crying for short periods of time. Not caring about anything (apathy). Difficulty sleeping or sleeping too much. No longer able to enjoy the things you used to enjoy. Desire to be by oneself all the time (social isolation). Lack of energy or motivation. Difficulty concentrating or remembering. Change in appetite or weight. Restlessness or agitation. Symptoms of clinical depression include the same symptoms of normal sadness or normal grief and also the following symptoms: Feeling sad or crying all the time. Feelings of guilt or worthlessness. Feelings of hopelessness or helplessness. Thoughts of suicide or the desire to harm yourself (suicidal ideation). Loss of touch with reality (psychotic symptoms). Seeing or hearing things that  are not real (hallucinations) or having false beliefs about your life or the people around you  (delusions and paranoia). DIAGNOSIS  The diagnosis of clinical depression is usually based on how bad the symptoms are and how long they have lasted. Your health care provider will also ask you questions about your medical history and substance use to find out if physical illness, use of prescription medicine, or substance abuse is causing your depression. Your health care provider may also order blood tests. TREATMENT  Often, normal sadness and normal grief do not require treatment. However, sometimes antidepressant medicine is given for bereavement to ease the depressive symptoms until they resolve. The treatment for clinical depression depends on how bad the symptoms are but often includes antidepressant medicine, counseling with a mental health professional, or both. Your health care provider will help to determine what treatment is best for you. Depression caused by physical illness usually goes away with appropriate medical treatment of the illness. If prescription medicine is causing depression, talk with your health care provider about stopping the medicine, decreasing the dose, or changing to another medicine. Depression caused by the abuse of alcohol or illicit drugs goes away when you stop using these substances. Some adults need professional help in order to stop drinking or using drugs. SEEK IMMEDIATE MEDICAL CARE IF: You have thoughts about hurting yourself or others. You lose touch with reality (have psychotic symptoms). You are taking medicine for depression and have a serious side effect. FOR MORE INFORMATION [employer] on Mental [employer]ness: www.nami.[employer]: https://carter.com/ Document Released: 07/03/2000 Document Revised: 11/20/2013 Document Reviewed: 10/05/2011 [facility] Patient Information 2015 [place], [place]. This information is not intended to replace advice given to you by your health care provider. Make sure you discuss any questions you  have with your health care provider.

## 2014-06-04 NOTE — ED Provider Notes (Signed)
CSN: [id number]     Arrival date & time 06/04/14  1712 History   First MD Initiated Contact with Patient 06/04/14 1846     Chief Complaint  Patient presents with  . Suicidal  . Seizures     (Consider location/radiation/quality/duration/timing/severity/associated sxs/prior Treatment) HPI  The patient reports that she does not have any suicidal ideations. She reports that she has had a lot of personal problems and depression related to recent divorce. She reports that she is an alcoholic and she is aware of that. She however denies that she has any plans or thoughts about hurting or killing herself. She reports she has 3 children and there is no way that she would kill herself. She reports that she is undergoing counseling but nonetheless has significant depression due to the current circumstances. She reports that the amount that she drinks waxes and wanes. However, she reports that she can drink a bottle of wine without any difficulty. She denies any use of any prescription medications or other medications of abuse. She reports it is not atypical for her to have seizures. She identifies this as an ongoing problem and reports that may occur suddenly without her being aware of a seizure starting.  Past Medical History  Diagnosis Date  . Proctitis   . Cysts of both ovaries   . Anemia   . Anxiety   . Blood transfusion without reported diagnosis   . Depression   . Fatty liver 10/05/13  . Cardiac arrest   . Seizures    Past Surgical History  Procedure Laterality Date  . Ovarian cyst removal    . Laparoscopy N/A 09/28/2013    Procedure: LAPAROSCOPY OPERATIVE;  Surgeon: Terrance Mass, MD;  Location: [facility];  Service: Gynecology;  Laterality: N/A;  . Laparoscopic appendectomy Right 09/28/2013    Procedure: APPENDECTOMY LAPAROSCOPIC;  Surgeon: Terrance Mass, MD;  Location: [facility];  Service: Gynecology;  Laterality: Right;  . Salpingoophorectomy Right 09/28/2013    Procedure: SALPINGO  OOPHORECTOMY;  Surgeon: Terrance Mass, MD;  Location: [facility];  Service: Gynecology;  Laterality: Right;  . Colonoscopy N/A 09/30/2013    Procedure: COLONOSCOPY;  Surgeon: Lafayette Dragon, MD;  Location: [facility];  Service: Endoscopy;  Laterality: N/A;  . Esophagogastroduodenoscopy N/A 11/23/2013    Procedure: ESOPHAGOGASTRODUODENOSCOPY (EGD);  Surgeon: Jerene Bears, MD;  Location: [facility];  Service: Endoscopy;  Laterality: N/A;  . Appendectomy     Family History  Problem Relation Age of Onset  . Diabetes Mother   . Hyperlipidemia Mother   . Stroke Mother   . Diabetes Father    History  Substance Use Topics  . Smoking status: Never Smoker   . Smokeless tobacco: Never Used  . Alcohol Use: Yes   OB History    Gravida Para Term Preterm AB TAB SAB Ectopic Multiple Living   7 3   4  4   3      Review of Systems 10 Systems reviewed and are negative for acute change except as noted in the HPI.    Allergies  Morphine and related; Tramadol; and Penicillins  Home Medications   Prior to Admission medications   Medication Sig Start Date End Date Taking? Authorizing Provider  carvedilol (COREG) 3.125 MG tablet Take 0.5 tablets (1.5625 mg total) by mouth 2 (two) times daily with a meal. 05/22/14  Yes Freda Munro May Agustin, NP  clonazePAM (KLONOPIN) 0.5 MG tablet Take 1 tablet (0.5 mg total) by mouth 3 (three) times  daily as needed for anxiety. Will follow up with PCP. 05/21/14  Yes Freda Munro May Agustin, NP  FLUoxetine (PROZAC) 40 MG capsule Take 1 capsule (40 mg total) by mouth daily. 05/21/14  Yes Freda Munro May Agustin, NP  folic acid (FOLVITE) 1 MG tablet Take 1 tablet (1 mg total) by mouth daily. 05/22/14  Yes Freda Munro May Agustin, NP  hydrOXYzine (ATARAX/VISTARIL) 25 MG tablet Take 1 tablet (25 mg total) by mouth every 6 (six) hours as needed for anxiety. 05/21/14  Yes Freda Munro May Agustin, NP  ibuprofen (ADVIL,MOTRIN) 200 MG tablet Take 600 mg by mouth every 6 (six) hours as needed for mild pain  or moderate pain.   Yes Historical Provider, MD  levETIRAcetam (KEPPRA) 1000 MG tablet Take 1 tablet (1,000 mg total) by mouth daily. 05/21/14  Yes Freda Munro May Agustin, NP  traZODone (DESYREL) 100 MG tablet Take 1 tablet (100 mg total) by mouth at bedtime as needed for sleep (sleep). 05/21/14  Yes Freda Munro May Agustin, NP   BP 143/89 mmHg  Pulse 94  Temp(Src) 98.4 F (36.9 C) (Oral)  Resp 20  SpO2 93%  LMP 05/31/2014 Physical Exam  Constitutional: She is oriented to person, place, and time. She appears well-developed and well-nourished.  The patient smells slightly of alcohol however she is alert and her speech is clear and she is sitting up. She ambulates without difficulty and there is not evidence of acute impairment in terms of her motor function or cognitive function.  HENT:  Head: Normocephalic and atraumatic.  The patient has a 1 cm erythematous contusion to her right zygoma.  Eyes: EOM are normal. Pupils are equal, round, and reactive to light.  No nystagmus.  Neck: Neck supple.  Cardiovascular: Normal rate, regular rhythm, normal heart sounds and intact distal pulses.   Pulmonary/Chest: Effort normal and breath sounds normal.  Abdominal: Soft. Bowel sounds are normal. She exhibits no distension. There is no tenderness.  Musculoskeletal: Normal range of motion. She exhibits no edema or tenderness.  Neurological: She is alert and oriented to person, place, and time. She has normal strength. Coordination normal. GCS eye subscore is 4. GCS verbal subscore is 5. GCS motor subscore is 6.  The patient and be towards a coordinated gait.  Skin: Skin is warm, dry and intact.  Psychiatric: She has a normal mood and affect.  The patient is appropriate, she has good eye contact, occasionally in describing some of her personal situation she becomes briefly tearful.    ED Course  Procedures (including critical care time) Labs Review Labs Reviewed  CBC - Abnormal; Notable for the following:     WBC 3.3 (*)    RDW 18.3 (*)    All other components within normal limits  COMPREHENSIVE METABOLIC PANEL - Abnormal; Notable for the following:    Potassium 3.6 (*)    Glucose, Bld 129 (*)    Creatinine, Ser 0.47 (*)    AST 126 (*)    ALT 62 (*)    Alkaline Phosphatase 118 (*)    Anion gap 16 (*)    All other components within normal limits  ETHANOL - Abnormal; Notable for the following:    Alcohol, Ethyl (B) 406 (*)    All other components within normal limits  SALICYLATE LEVEL - Abnormal; Notable for the following:    Salicylate Lvl <1.8 (*)    All other components within normal limits  ACETAMINOPHEN LEVEL  URINE RAPID DRUG SCREEN (HOSP PERFORMED)    Imaging Review No  results found.   EKG Interpretation None      MDM   Final diagnoses:  Alcohol dependence with acute alcoholic intoxication, uncomplicated  Depression   At this time after extensive discussion with the patient there is not indication of acute suicidality. Patient does endorse significant problems with depression and alcoholism although does not express any intention or thoughts of self-harm or harming anyone else.    Kristen Shanks, MD 06/04/14 [phone]

## 2014-06-04 NOTE — ED Notes (Addendum)
Pt called EMS c/o seizure activity. States she was walking her dog and woke up on the floor after having seizure. Pt appears to have contusion on her R face and R lower abdomen also hurts after the fall. Per [employer] who brought her in, EMS reported that upon leaving her house she stated "well I'll just have to kill myself when you leave-there's plenty enough knives in here to do that." [employer] was called and they brought her in. Pt remains voluntary at this time. States she drank a bottle of wine today. Denies any thoughts of self harm at this time. States "I wouldn't hurt a fly." Alert and oriented.

## 2014-06-05 ENCOUNTER — Telehealth: Payer: Self-pay | Admitting: Neurology

## 2014-06-05 NOTE — Telephone Encounter (Signed)
Pt no showed 06/04/14 follow up appt w/ Dr. Delice Lesch.  Danae Chen - please send no show letter.  / Gayleen Orem.

## 2014-06-08 ENCOUNTER — Encounter (HOSPITAL_COMMUNITY): Payer: Self-pay | Admitting: Emergency Medicine

## 2014-06-08 ENCOUNTER — Emergency Department (HOSPITAL_COMMUNITY)
Admission: EM | Admit: 2014-06-08 | Discharge: 2014-06-08 | Disposition: A | Discharge status: Home / Self Care | Payer: MEDICAID | Attending: Emergency Medicine | Admitting: Emergency Medicine

## 2014-06-08 ENCOUNTER — Emergency Department (HOSPITAL_COMMUNITY): Payer: MEDICAID

## 2014-06-08 DIAGNOSIS — Z8719 Personal history of other diseases of the digestive system: Secondary | ICD-10-CM | POA: Insufficient documentation

## 2014-06-08 DIAGNOSIS — M79604 Pain in right leg: Secondary | ICD-10-CM | POA: Insufficient documentation

## 2014-06-08 DIAGNOSIS — Z8674 Personal history of sudden cardiac arrest: Secondary | ICD-10-CM | POA: Insufficient documentation

## 2014-06-08 DIAGNOSIS — R200 Anesthesia of skin: Secondary | ICD-10-CM | POA: Insufficient documentation

## 2014-06-08 DIAGNOSIS — R1084 Generalized abdominal pain: Secondary | ICD-10-CM | POA: Insufficient documentation

## 2014-06-08 DIAGNOSIS — D649 Anemia, unspecified: Secondary | ICD-10-CM | POA: Insufficient documentation

## 2014-06-08 DIAGNOSIS — Z8742 Personal history of other diseases of the female genital tract: Secondary | ICD-10-CM | POA: Insufficient documentation

## 2014-06-08 DIAGNOSIS — M79605 Pain in left leg: Secondary | ICD-10-CM | POA: Insufficient documentation

## 2014-06-08 DIAGNOSIS — G8929 Other chronic pain: Secondary | ICD-10-CM | POA: Insufficient documentation

## 2014-06-08 DIAGNOSIS — G40909 Epilepsy, unspecified, not intractable, without status epilepticus: Secondary | ICD-10-CM | POA: Insufficient documentation

## 2014-06-08 DIAGNOSIS — R05 Cough: Secondary | ICD-10-CM

## 2014-06-08 DIAGNOSIS — Z880 Allergy status to penicillin: Secondary | ICD-10-CM | POA: Insufficient documentation

## 2014-06-08 DIAGNOSIS — R059 Cough, unspecified: Secondary | ICD-10-CM

## 2014-06-08 DIAGNOSIS — F419 Anxiety disorder, unspecified: Secondary | ICD-10-CM | POA: Insufficient documentation

## 2014-06-08 DIAGNOSIS — F329 Major depressive disorder, single episode, unspecified: Secondary | ICD-10-CM | POA: Insufficient documentation

## 2014-06-08 DIAGNOSIS — Z419 Encounter for procedure for purposes other than remedying health state, unspecified: Secondary | ICD-10-CM | POA: Insufficient documentation

## 2014-06-08 LAB — CBC WITH DIFFERENTIAL/PLATELET
BASOS ABS: 0 10*3/uL (ref 0.0–0.1)
Basophils Relative: 1 % (ref 0–1)
Eosinophils Absolute: 0 10*3/uL (ref 0.0–0.7)
Eosinophils Relative: 0 % (ref 0–5)
HEMATOCRIT: 36.5 % (ref 36.0–46.0)
HEMOGLOBIN: 12.2 g/dL (ref 12.0–15.0)
LYMPHS PCT: 39 % (ref 12–46)
Lymphs Abs: 1 10*3/uL (ref 0.7–4.0)
MCH: 29.8 pg (ref 26.0–34.0)
MCHC: 33.4 g/dL (ref 30.0–36.0)
MCV: 89 fL (ref 78.0–100.0)
Monocytes Absolute: 0.3 10*3/uL (ref 0.1–1.0)
Monocytes Relative: 13 % — ABNORMAL HIGH (ref 3–12)
NEUTROS ABS: 1.3 10*3/uL — AB (ref 1.7–7.7)
NEUTROS PCT: 48 % (ref 43–77)
Platelets: 101 10*3/uL — ABNORMAL LOW (ref 150–400)
RBC: 4.1 MIL/uL (ref 3.87–5.11)
RDW: 18.5 % — AB (ref 11.5–15.5)
WBC: 2.7 10*3/uL — AB (ref 4.0–10.5)

## 2014-06-08 LAB — BASIC METABOLIC PANEL
Anion gap: 20 — ABNORMAL HIGH (ref 5–15)
BUN: 8 mg/dL (ref 6–23)
CO2: 21 mEq/L (ref 19–32)
CREATININE: 0.43 mg/dL — AB (ref 0.50–1.10)
Calcium: 8.3 mg/dL — ABNORMAL LOW (ref 8.4–10.5)
Chloride: 96 mEq/L (ref 96–112)
GFR calc non Af Amer: >90 mL/min (ref >90)
Glucose, Bld: 118 mg/dL — ABNORMAL HIGH (ref 70–99)
Potassium: 3.5 mEq/L — ABNORMAL LOW (ref 3.7–5.3)
Sodium: 137 mEq/L (ref 137–147)

## 2014-06-08 LAB — MAGNESIUM: Magnesium: 1.9 mg/dL (ref 1.5–2.5)

## 2014-06-08 MED ORDER — BENZONATATE 100 MG PO CAPS
100.0000 mg | ORAL_CAPSULE | Freq: Once | ORAL | Status: AC
  Administered 2014-06-08: 100 mg via ORAL
  Filled 2014-06-08: qty 1

## 2014-06-08 NOTE — Discharge Instructions (Signed)
Cough, Adult  A cough is a reflex that helps clear your throat and airways. It can help heal the body or may be a reaction to an irritated airway. A cough may only last 2 or 3 weeks (acute) or may last more than 8 weeks (chronic).  CAUSES Acute cough:  Viral or bacterial infections. Chronic cough:  Infections.  Allergies.  Asthma.  Post-nasal drip.  Smoking.  Heartburn or acid reflux.  Some medicines.  Chronic lung problems (COPD).  Cancer. SYMPTOMS   Cough.  Fever.  Chest pain.  Increased breathing rate.  High-pitched whistling sound when breathing (wheezing).  Colored mucus that you cough up (sputum). TREATMENT   A bacterial cough may be treated with antibiotic medicine.  A viral cough must run its course and will not respond to antibiotics.  Your caregiver may recommend other treatments if you have a chronic cough. HOME CARE INSTRUCTIONS   Only take over-the-counter or prescription medicines for pain, discomfort, or fever as directed by your caregiver. Use cough suppressants only as directed by your caregiver.  Use a cold steam vaporizer or humidifier in your bedroom or home to help loosen secretions.  Sleep in a semi-upright position if your cough is worse at night.  Rest as needed.  Stop smoking if you smoke. SEEK IMMEDIATE MEDICAL CARE IF:   You have pus in your sputum.  Your cough starts to worsen.  You cannot control your cough with suppressants and are losing sleep.  You begin coughing up blood.  You have difficulty breathing.  You develop pain which is getting worse or is uncontrolled with medicine.  You have a fever. MAKE SURE YOU:   Understand these instructions.  Will watch your condition.  Will get help right away if you are not doing well or get worse. Document Released: 01/02/2011 Document Revised: 09/28/2011 Document Reviewed: 01/02/2011 [employer] Patient Information 2015 [employer]. This information is not intended  to replace advice given to you by your health care provider. Make sure you discuss any questions you have with your health care provider.  

## 2014-06-08 NOTE — ED Notes (Signed)
Kristen Roberson, will pick up pt when she is up for discharge. #[phone]

## 2014-06-08 NOTE — ED Notes (Signed)
Pt reports 2 day history of cough, states she is coughing up some red sputum. Sore throat, nasal congestion and nasal drainage. Reports intermittent chest pain for past day. Pt also reports sleepiness and generalized body aches.
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
## 2014-06-11 ENCOUNTER — Telehealth: Payer: Self-pay

## 2014-06-11 ENCOUNTER — Encounter: Payer: Self-pay | Admitting: *Deleted

## 2014-06-11 LAB — I-STAT TROPONIN, ED: Troponin i, poc: 0.01 ng/mL (ref 0.00–0.08)

## 2014-06-11 NOTE — Telephone Encounter (Signed)
Placed call to patient to discuss need for office follow-up appointment.  Patient indicates she still has a frequent cough. Discussed patient able to walk-in to [facility] to be seen.  Patient informed she is able to walk-in 11/24 and 11/25 between 0900-1100. Patient also indicates she has been coughing up blood and bleeding from "both ends."  Asked patient to quantify amount, and she indicates she has coughed up about "half a cup of blood" today.  Expressed to patient that this is a medical emergency and she needs to call EMS and get to the ED immediately. Patient indicates she understood but was not going to go to the ED because she "could not afford it."   Explained that she could speak with Finance at the hospital but it was imperative to get to the ED.  Addendum-Spoke with Dr. Doreene Burke about phone call with patient, and he also indicated patient should go to the Emergency Department.  Placed additional call to patient and explained once again that she needs to call EMS and get to the Emergency Department. She indicated that she understood but was uncertain about going. Explained that this was a medical emergency and she must get to the ED. Patient indicates she understood.

## 2014-06-11 NOTE — Progress Notes (Signed)
No show letter sent for 06/06/2014

## 2014-06-18 ENCOUNTER — Encounter (HOSPITAL_COMMUNITY): Payer: Self-pay | Admitting: Emergency Medicine

## 2014-06-18 ENCOUNTER — Emergency Department (HOSPITAL_COMMUNITY)
Admission: EM | Admit: 2014-06-18 | Discharge: 2014-06-18 | Disposition: A | Discharge status: Home / Self Care | Payer: Self-pay | Attending: Emergency Medicine | Admitting: Emergency Medicine

## 2014-06-18 DIAGNOSIS — F419 Anxiety disorder, unspecified: Secondary | ICD-10-CM | POA: Insufficient documentation

## 2014-06-18 DIAGNOSIS — Z880 Allergy status to penicillin: Secondary | ICD-10-CM | POA: Insufficient documentation

## 2014-06-18 DIAGNOSIS — G40909 Epilepsy, unspecified, not intractable, without status epilepticus: Secondary | ICD-10-CM | POA: Insufficient documentation

## 2014-06-18 DIAGNOSIS — I469 Cardiac arrest, cause unspecified: Secondary | ICD-10-CM | POA: Insufficient documentation

## 2014-06-18 DIAGNOSIS — R569 Unspecified convulsions: Secondary | ICD-10-CM

## 2014-06-18 DIAGNOSIS — F329 Major depressive disorder, single episode, unspecified: Secondary | ICD-10-CM | POA: Insufficient documentation

## 2014-06-18 DIAGNOSIS — Z3202 Encounter for pregnancy test, result negative: Secondary | ICD-10-CM | POA: Insufficient documentation

## 2014-06-18 DIAGNOSIS — D649 Anemia, unspecified: Secondary | ICD-10-CM | POA: Insufficient documentation

## 2014-06-18 DIAGNOSIS — Z79899 Other long term (current) drug therapy: Secondary | ICD-10-CM | POA: Insufficient documentation

## 2014-06-18 LAB — BASIC METABOLIC PANEL
ANION GAP: 18 — AB (ref 5–15)
BUN: 8 mg/dL (ref 6–23)
CALCIUM: 9.1 mg/dL (ref 8.4–10.5)
CHLORIDE: 103 meq/L (ref 96–112)
CO2: 22 mEq/L (ref 19–32)
Creatinine, Ser: 0.51 mg/dL (ref 0.50–1.10)
GFR calc non Af Amer: >90 mL/min (ref >90)
Glucose, Bld: 86 mg/dL (ref 70–99)
Potassium: 3.7 mEq/L (ref 3.7–5.3)
SODIUM: 143 meq/L (ref 137–147)

## 2014-06-18 LAB — CBC WITH DIFFERENTIAL/PLATELET
Basophils Absolute: 0 10*3/uL (ref 0.0–0.1)
Basophils Relative: 1 % (ref 0–1)
Eosinophils Absolute: 0 10*3/uL (ref 0.0–0.7)
Eosinophils Relative: 0 % (ref 0–5)
HEMATOCRIT: 34.6 % — AB (ref 36.0–46.0)
HEMOGLOBIN: 11.1 g/dL — AB (ref 12.0–15.0)
LYMPHS PCT: 36 % (ref 12–46)
Lymphs Abs: 1 10*3/uL (ref 0.7–4.0)
MCH: 30.3 pg (ref 26.0–34.0)
MCHC: 32.1 g/dL (ref 30.0–36.0)
MCV: 94.5 fL (ref 78.0–100.0)
MONO ABS: 0.4 10*3/uL (ref 0.1–1.0)
Monocytes Relative: 14 % — ABNORMAL HIGH (ref 3–12)
NEUTROS ABS: 1.3 10*3/uL — AB (ref 1.7–7.7)
NEUTROS PCT: 49 % (ref 43–77)
Platelets: 159 10*3/uL (ref 150–400)
RBC: 3.66 MIL/uL — ABNORMAL LOW (ref 3.87–5.11)
RDW: 19.3 % — AB (ref 11.5–15.5)
WBC: 2.8 10*3/uL — AB (ref 4.0–10.5)

## 2014-06-18 LAB — URINALYSIS, ROUTINE W REFLEX MICROSCOPIC
Bilirubin Urine: NEGATIVE
Glucose, UA: NEGATIVE mg/dL
Ketones, ur: NEGATIVE mg/dL
LEUKOCYTES UA: NEGATIVE
Nitrite: NEGATIVE
Protein, ur: NEGATIVE mg/dL
SPECIFIC GRAVITY, URINE: 1.019 (ref 1.005–1.030)
UROBILINOGEN UA: 0.2 mg/dL (ref 0.0–1.0)
pH: 6 (ref 5.0–8.0)

## 2014-06-18 LAB — URINE MICROSCOPIC-ADD ON

## 2014-06-18 LAB — RAPID URINE DRUG SCREEN, HOSP PERFORMED
Amphetamines: NOT DETECTED
Barbiturates: NOT DETECTED
Benzodiazepines: NOT DETECTED
COCAINE: NOT DETECTED
Opiates: NOT DETECTED
TETRAHYDROCANNABINOL: NOT DETECTED

## 2014-06-18 LAB — CBG MONITORING, ED: Glucose-Capillary: 91 mg/dL (ref 70–99)

## 2014-06-18 LAB — PREGNANCY, URINE: Preg Test, Ur: NEGATIVE

## 2014-06-18 LAB — ETHANOL: Alcohol, Ethyl (B): 299 mg/dL — ABNORMAL HIGH (ref 0–11)

## 2014-06-18 MED ORDER — LEVETIRACETAM IN NACL 1000 MG/100ML IV SOLN
1000.0000 mg | Freq: Once | INTRAVENOUS | Status: AC
  Administered 2014-06-18: 1000 mg via INTRAVENOUS
  Filled 2014-06-18: qty 100

## 2014-06-18 MED ORDER — LORAZEPAM 2 MG/ML IJ SOLN
1.0000 mg | Freq: Once | INTRAMUSCULAR | Status: AC
  Administered 2014-06-18: 1 mg via INTRAVENOUS
  Filled 2014-06-18: qty 1

## 2014-06-18 MED ORDER — KETOROLAC TROMETHAMINE 30 MG/ML IJ SOLN
30.0000 mg | Freq: Once | INTRAMUSCULAR | Status: AC
  Administered 2014-06-18: 30 mg via INTRAVENOUS
  Filled 2014-06-18: qty 1

## 2014-06-18 MED ORDER — SODIUM CHLORIDE 0.9 % IV BOLUS (SEPSIS)
1000.0000 mL | Freq: Once | INTRAVENOUS | Status: AC
  Administered 2014-06-18: 1000 mL via INTRAVENOUS

## 2014-06-18 NOTE — ED Notes (Addendum)
Per EMS: called out for seizure like activity, 5-6 today per fiance, pt not oriented upon EMS arrival.  Pt eventually became oriented. No oral trauma or incontinence  Always preceded by patient stating that she feels a seizure coming. 3 similar seizure like episodes.  With 15-30 second post-ictal periods.  Seizures last 25-35 seconds.  Patient  Had seizure in the ED still on EMS stretcher, limited to 30 seconds, patient was erratic, agitated, and confused upon arrival to the room in the ED.  Dr. Stark Jock at bedside.  Hx of similar episodes, takes keppra.

## 2014-06-18 NOTE — ED Notes (Signed)
Pt's fiance assisting with use of bedpan. Informed to press call bell if assistance is needed.

## 2014-06-18 NOTE — Discharge Instructions (Signed)
Continue your Keppra as prescribed.  Be sure to follow-up with your primary doctor, and return to the emergency department if you develop any new and concerning symptoms.   Seizure, Adult A seizure is abnormal electrical activity in the brain. Seizures usually last from 30 seconds to 2 minutes. There are various types of seizures. Before a seizure, you may have a warning sensation (aura) that a seizure is about to occur. An aura may include the following symptoms:   Fear or anxiety.  Nausea.  Feeling like the room is spinning (vertigo).  Vision changes, such as seeing flashing lights or spots. Common symptoms during a seizure include:  A change in attention or behavior (altered mental status).  Convulsions with rhythmic jerking movements.  Drooling.  Rapid eye movements.  Grunting.  Loss of bladder and bowel control.  Bitter taste in the mouth.  Tongue biting. After a seizure, you may feel confused and sleepy. You may also have an injury resulting from convulsions during the seizure. HOME CARE INSTRUCTIONS   If you are given medicines, take them exactly as prescribed by your health care provider.  Keep all follow-up appointments as directed by your health care provider.  Do not swim or drive or engage in risky activity during which a seizure could cause further injury to you or others until your health care provider says it is OK.  Get adequate rest.  Teach friends and family what to do if you have a seizure. They should:  Lay you on the ground to prevent a fall.  Put a cushion under your head.  Loosen any tight clothing around your neck.  Turn you on your side. If vomiting occurs, this helps keep your airway clear.  Stay with you until you recover.  Know whether or not you need emergency care. SEEK IMMEDIATE MEDICAL CARE IF:  The seizure lasts longer than 5 minutes.  The seizure is severe or you do not wake up immediately after the seizure.  You have an  altered mental status after the seizure.  You are having more frequent or worsening seizures. Someone should drive you to the emergency department or call local emergency services (911 in [place] MAKE SURE YOU:  Understand these instructions.  Will watch your condition.  Will get help right away if you are not doing well or get worse. Document Released: 07/03/2000 Document Revised: 04/26/2013 Document Reviewed: 02/15/2013 [facility] Patient Information 2015 [place], [place]. This information is not intended to replace advice given to you by your health care provider. Make sure you discuss any questions you have with your health care provider.

## 2014-06-18 NOTE — ED Provider Notes (Signed)
CSN: [id number]     Arrival date & time 06/18/14  1523 History   First MD Initiated Contact with Patient 06/18/14 1524     No chief complaint on file.    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 She presents today for evaluation of several "seizures" that have occurred throughout the day. Her fianc describes these as generalized shaking and states that she loses consciousness. These last for several minutes prior to resolution. She has been to the ER multiple times over the past several months with similar complaints. She has had extensive workup performed including EEGs, MRIs, however no cause is been found. She has been diagnosed with pseudoseizures.  She also has a history of some sort of cardiac arrest resulting from electrolyte abnormalities related to her chronic alcoholism. This apparently occurred sometime in July. She is also complaining of severe pain in her right lower quadrant which is chronic and not new from reading her prior emergency department visit notes.  The history is provided by the patient.    Past Medical History  Diagnosis Date  . Proctitis   . Cysts of both ovaries   . Anemia   . Anxiety   . Blood transfusion without reported diagnosis   . Depression   . Fatty liver 10/05/13  . Cardiac arrest   . Seizures    Past Surgical History  Procedure Laterality Date  . Ovarian cyst removal    . Laparoscopy N/A 09/28/2013    Procedure: LAPAROSCOPY OPERATIVE;  Surgeon: Terrance Mass, MD;  Location: [facility];  Service: Gynecology;  Laterality: N/A;  . Laparoscopic appendectomy Right 09/28/2013    Procedure: APPENDECTOMY LAPAROSCOPIC;  Surgeon: Terrance Mass, MD;  Location: [facility];  Service: Gynecology;  Laterality: Right;  . Salpingoophorectomy Right 09/28/2013    Procedure: SALPINGO  OOPHORECTOMY;  Surgeon: Terrance Mass, MD;  Location: [facility];  Service: Gynecology;  Laterality: Right;  . Colonoscopy N/A 09/30/2013    Procedure: COLONOSCOPY;  Surgeon: Lafayette Dragon, MD;  Location: [facility];  Service: Endoscopy;  Laterality: N/A;  . Esophagogastroduodenoscopy N/A 11/23/2013    Procedure: ESOPHAGOGASTRODUODENOSCOPY (EGD);  Surgeon: Jerene Bears, MD;  Location: [facility];  Service: Endoscopy;  Laterality: N/A;  . Appendectomy     Family History  Problem Relation Age of Onset  . Diabetes Mother   . Hyperlipidemia Mother   . Stroke Mother   . Diabetes Father    History  Substance Use Topics  . Smoking status: Never Smoker   . Smokeless tobacco: Never Used  . Alcohol Use: Yes   OB History    Gravida Para Term Preterm AB TAB SAB Ectopic Multiple Living   7 3   4  4   3      Review of Systems  All other systems reviewed and are negative.     Allergies  Morphine and related; Tramadol; and Penicillins  Home Medications   Prior to Admission medications   Medication Sig Start Date End Date Taking? Authorizing Provider  carvedilol (COREG) 3.125 MG tablet Take 0.5 tablets (1.5625 mg total) by mouth 2 (two) times daily with a meal. 05/22/14   Freda Munro May Agustin, NP  clonazePAM (KLONOPIN) 0.5 MG tablet Take 1 tablet (0.5 mg total) by mouth 3 (three) times daily as needed for anxiety. Will follow up with PCP. 05/21/14   Janett Labella, NP  FLUoxetine (PROZAC) 40 MG  capsule Take 1 capsule (40 mg total) by mouth daily. 05/21/14   [place], NP  folic acid (FOLVITE) 1 MG tablet Take 1 tablet (1 mg total) by mouth daily. Patient not taking: Reported on 06/08/2014 05/22/14   [facility], NP  hydrOXYzine (ATARAX/VISTARIL) 25 MG tablet Take 1 tablet (25 mg total) by mouth every 6 (six) hours as needed for anxiety. 05/21/14   Freda Munro May Agustin, NP  ibuprofen (ADVIL,MOTRIN) 200 MG tablet Take 600 mg by mouth every 6 (six) hours as needed for mild pain or  moderate pain.    Historical Provider, MD  levETIRAcetam (KEPPRA) 1000 MG tablet Take 1 tablet (1,000 mg total) by mouth daily. 05/21/14   [place], NP  sodium-potassium bicarbonate (ALKA-SELTZER GOLD) TBEF dissolvable tablet Take 1 tablet by mouth daily as needed (cold).    Historical Provider, MD  traZODone (DESYREL) 100 MG tablet Take 1 tablet (100 mg total) by mouth at bedtime as needed for sleep (sleep). 05/21/14   Freda Munro May Agustin, NP   BP 118/74 mmHg  Temp(Src) 98.5 F (36.9 C) (Oral)  Resp 17  SpO2 100%  LMP 05/31/2014 Physical Exam  Constitutional: She is oriented to person, place, and time. She appears well-developed and well-nourished.  Patient is a 38 year old female in no acute distress. She appears very anxious. She is hyperventilating and shaking.  HENT:  Head: Normocephalic and atraumatic.  Mouth/Throat: Oropharynx is clear and moist.  Eyes: EOM are normal. Pupils are equal, round, and reactive to light.  Neck: Normal range of motion. Neck supple.  Cardiovascular: Normal rate, regular rhythm and normal heart sounds.   No murmur heard. Pulmonary/Chest: Effort normal and breath sounds normal. No respiratory distress. She has no wheezes.  Abdominal: Soft. Bowel sounds are normal. She exhibits no distension. There is no tenderness.  Musculoskeletal: Normal range of motion. She exhibits no edema.  Neurological: She is alert and oriented to person, place, and time. No cranial nerve deficit. She exhibits normal muscle tone. Coordination normal.  Patient is extremely anxious, shaking, and hyperventilating. She is noted to move all 4 extremities with purpose. The remainder of the neurologic exam is difficult to carry out due to the above.  Skin: Skin is warm and dry.  Nursing note and vitals reviewed.   ED Course  Procedures (including critical care time) Labs Review Labs Reviewed  BASIC METABOLIC PANEL  CBC WITH DIFFERENTIAL  PREGNANCY, URINE  URINALYSIS,  ROUTINE W REFLEX MICROSCOPIC  URINE RAPID DRUG SCREEN (HOSP PERFORMED)  ETHANOL    Imaging Review No results found.   EKG Interpretation   Date/Time:  Monday June 18 2014 15:27:04 EST Ventricular Rate:  106 PR Interval:  159 QRS Duration: 89 QT Interval:  350 QTC Calculation: 465 R Axis:   69 Text Interpretation:  Sinus tachycardia Baseline wander in lead(s) II aVF  V1 Confirmed by DELOS  MD, Keyetta Hollingworth ([id number]) on 06/18/2014 3:43:07 PM      MDM   Final diagnoses:  None    Patient is a 38 year old female with long-standing history of pseudoseizures, substance abuse, and psychiatric issues. She presents today for evaluation of multiple "seizures" that have occurred this afternoon. Workup today reveals no significant abnormalities with the exception of blood alcohol of 299. She was observed for several hours in the emergency department, given additional IV Keppra and has had no additional seizure activity since being given this medication.  I have witnessed 2 of these episodes that occurred immediately after she had  arrived here, and these appear to be pseudoseizures. She has had a normal neurologic workup in the past and I do not feel as though she requires additional testing area and I believe she is appropriate for discharge. She is on multiple occasions asked for pain medication, however I have advised her that due to her elevated blood alcohol she would not be receiving this. From reviewing her chart, I suspect there is a component of drug seeking.  She has a follow-up appointment with her primary care provider in the next few days and I will advise her to keep this appointment.    Veryl Speak, MD 06/18/14 [phone]
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
## 2014-06-25 ENCOUNTER — Ambulatory Visit: Payer: Self-pay | Admitting: Internal Medicine

## 2014-06-27 ENCOUNTER — Encounter (HOSPITAL_COMMUNITY): Payer: Self-pay | Admitting: Emergency Medicine

## 2014-06-27 ENCOUNTER — Emergency Department (HOSPITAL_COMMUNITY): Payer: Self-pay

## 2014-06-27 ENCOUNTER — Emergency Department (HOSPITAL_COMMUNITY)
Admission: EM | Admit: 2014-06-27 | Discharge: 2014-06-27 | Disposition: A | Discharge status: Home / Self Care | Payer: Self-pay | Attending: Emergency Medicine | Admitting: Emergency Medicine

## 2014-06-27 DIAGNOSIS — Y9289 Other specified places as the place of occurrence of the external cause: Secondary | ICD-10-CM | POA: Insufficient documentation

## 2014-06-27 DIAGNOSIS — Z862 Personal history of diseases of the blood and blood-forming organs and certain disorders involving the immune mechanism: Secondary | ICD-10-CM | POA: Insufficient documentation

## 2014-06-27 DIAGNOSIS — Y9389 Activity, other specified: Secondary | ICD-10-CM | POA: Insufficient documentation

## 2014-06-27 DIAGNOSIS — Z8719 Personal history of other diseases of the digestive system: Secondary | ICD-10-CM | POA: Insufficient documentation

## 2014-06-27 DIAGNOSIS — Z79899 Other long term (current) drug therapy: Secondary | ICD-10-CM | POA: Insufficient documentation

## 2014-06-27 DIAGNOSIS — Z8742 Personal history of other diseases of the female genital tract: Secondary | ICD-10-CM | POA: Insufficient documentation

## 2014-06-27 DIAGNOSIS — Z8659 Personal history of other mental and behavioral disorders: Secondary | ICD-10-CM | POA: Insufficient documentation

## 2014-06-27 DIAGNOSIS — G40909 Epilepsy, unspecified, not intractable, without status epilepticus: Secondary | ICD-10-CM | POA: Insufficient documentation

## 2014-06-27 DIAGNOSIS — F329 Major depressive disorder, single episode, unspecified: Secondary | ICD-10-CM | POA: Insufficient documentation

## 2014-06-27 DIAGNOSIS — S0990XA Unspecified injury of head, initial encounter: Secondary | ICD-10-CM | POA: Insufficient documentation

## 2014-06-27 DIAGNOSIS — R51 Headache: Secondary | ICD-10-CM

## 2014-06-27 DIAGNOSIS — W108XXA Fall (on) (from) other stairs and steps, initial encounter: Secondary | ICD-10-CM | POA: Insufficient documentation

## 2014-06-27 DIAGNOSIS — Z880 Allergy status to penicillin: Secondary | ICD-10-CM | POA: Insufficient documentation

## 2014-06-27 DIAGNOSIS — R519 Headache, unspecified: Secondary | ICD-10-CM

## 2014-06-27 DIAGNOSIS — S0083XA Contusion of other part of head, initial encounter: Secondary | ICD-10-CM | POA: Insufficient documentation

## 2014-06-27 DIAGNOSIS — F419 Anxiety disorder, unspecified: Secondary | ICD-10-CM | POA: Insufficient documentation

## 2014-06-27 DIAGNOSIS — Y998 Other external cause status: Secondary | ICD-10-CM | POA: Insufficient documentation

## 2014-06-27 LAB — I-STAT CHEM 8, ED
BUN: 7 mg/dL (ref 6–23)
CALCIUM ION: 1.01 mmol/L — AB (ref 1.12–1.23)
CREATININE: 0.8 mg/dL (ref 0.50–1.10)
Chloride: 106 mEq/L (ref 96–112)
GLUCOSE: 95 mg/dL (ref 70–99)
HEMATOCRIT: 38 % (ref 36.0–46.0)
HEMOGLOBIN: 12.9 g/dL (ref 12.0–15.0)
Potassium: 3.9 mEq/L (ref 3.7–5.3)
Sodium: 142 mEq/L (ref 137–147)
TCO2: 21 mmol/L (ref 0–100)

## 2014-06-27 LAB — POC URINE PREG, ED: Preg Test, Ur: NEGATIVE

## 2014-06-27 MED ORDER — DIPHENHYDRAMINE HCL 25 MG PO CAPS: 25.0000 mg | ORAL_CAPSULE | Freq: Once | ORAL | Status: DC

## 2014-06-27 MED ORDER — ACETAMINOPHEN 325 MG PO TABS
650.0000 mg | ORAL_TABLET | Freq: Once | ORAL | Status: DC
  Filled 2014-06-27: qty 2

## 2014-06-27 MED ORDER — ACETAMINOPHEN 160 MG/5ML PO SOLN
650.0000 mg | Freq: Once | ORAL | Status: AC
  Administered 2014-06-27: 650 mg via ORAL
  Filled 2014-06-27: qty 20.3

## 2014-06-27 MED ORDER — THIAMINE HCL 100 MG/ML IJ SOLN
Freq: Once | INTRAVENOUS | Status: AC
  Administered 2014-06-27: 17:00:00 via INTRAVENOUS
  Filled 2014-06-27: qty 1000

## 2014-06-27 MED ORDER — DIPHENHYDRAMINE HCL 50 MG/ML IJ SOLN
25.0000 mg | Freq: Once | INTRAMUSCULAR | Status: AC
  Administered 2014-06-27: 25 mg via INTRAVENOUS
  Filled 2014-06-27: qty 1

## 2014-06-27 MED ORDER — HYDROMORPHONE HCL 1 MG/ML IJ SOLN
1.0000 mg | Freq: Once | INTRAMUSCULAR | Status: AC
  Administered 2014-06-27: 1 mg via INTRAVENOUS
  Filled 2014-06-27: qty 1
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
## 2014-06-27 NOTE — ED Notes (Signed)
Social worker at bedside. Case manager to come to bedside.
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
## 2014-06-27 NOTE — ED Notes (Signed)
Patient aware she was not to leave before social work came. Left with friend Marya Amsler.

## 2014-06-27 NOTE — ED Notes (Signed)
Gave patient her friend Greg's number per her request. MD aware patient still c/o pain. All CT and X-rays clear.

## 2014-06-27 NOTE — ED Notes (Signed)
Pt's husband asked this nurse to take pt's purse to her room, since pt is not allowed to have visitors at this time.

## 2014-06-27 NOTE — Discharge Instructions (Signed)
Assault, General °Assault includes any behavior, whether intentional or reckless, which results in bodily injury to another person and/or damage to property. Included in this would be any behavior, intentional or reckless, that by its nature would be understood (interpreted) by a reasonable person as intent to harm another person or to damage his/her property. Threats may be oral or written. They may be communicated through regular mail, computer, fax, or phone. These threats may be direct or implied. °FORMS OF ASSAULT INCLUDE: °· Physically assaulting a person. This includes physical threats to inflict physical harm as well as: °¨ Slapping. °¨ Hitting. °¨ Poking. °¨ Kicking. °¨ Punching. °¨ Pushing. °· Arson. °· Sabotage. °· Equipment vandalism. °· Damaging or destroying property. °· Throwing or hitting objects. °· Displaying a weapon or an object that appears to be a weapon in a threatening manner. °¨ Carrying a firearm of any kind. °¨ Using a weapon to harm someone. °· Using greater physical size/strength to intimidate another. °¨ Making intimidating or threatening gestures. °¨ Bullying. °¨ Hazing. °· Intimidating, threatening, hostile, or abusive language directed toward another person. °¨ It communicates the intention to engage in violence against that person. And it leads a reasonable person to expect that violent behavior may occur. °· Stalking another person. °IF IT HAPPENS AGAIN: °· Immediately call for emergency help (911 in [place] °· If someone poses clear and immediate danger to you, seek legal authorities to have a protective or restraining order put in place. °· Less threatening assaults can at least be reported to authorities. °STEPS TO TAKE IF A SEXUAL ASSAULT HAS HAPPENED °· Go to an area of safety. This may include a shelter or staying with a friend. Stay away from the area where you have been attacked. A large percentage of sexual assaults are caused by a friend, relative or associate. °· If  medications were given by your caregiver, take them as directed for the full length of time prescribed. °· Only take over-the-counter or prescription medicines for pain, discomfort, or fever as directed by your caregiver. °· If you have come in contact with a sexual disease, find out if you are to be tested again. If your caregiver is concerned about the HIV/AIDS virus, he/she may require you to have continued testing for several months. °· For the protection of your privacy, test results can not be given over the phone. Make sure you receive the results of your test. If your test results are not back during your visit, make an appointment with your caregiver to find out the results. Do not assume everything is normal if you have not heard from your caregiver or the medical facility. It is important for you to follow up on all of your test results. °· File appropriate papers with authorities. This is important in all assaults, even if it has occurred in a family or by a friend. °SEEK MEDICAL CARE IF: °· You have new problems because of your injuries. °· You have problems that may be because of the medicine you are taking, such as: °¨ Rash. °¨ Itching. °¨ Swelling. °¨ Trouble breathing. °· You develop belly (abdominal) pain, feel sick to your stomach (nausea) or are vomiting. °· You begin to run a temperature. °· You need supportive care or referral to a rape crisis center. These are centers with trained personnel who can help you get through this ordeal. °SEEK IMMEDIATE MEDICAL CARE IF: °· You are afraid of being threatened, beaten, or abused. In [place] call 911. °· You   receive new injuries related to abuse. °· You develop severe pain in any area injured in the assault or have any change in your condition that concerns you. °· You faint or lose consciousness. °· You develop chest pain or shortness of breath. °Document Released: 07/06/2005 Document Revised: 09/28/2011 Document Reviewed: 02/22/2008 °ExitCare® Patient  Information ©2015 [employer]. This information is not intended to replace advice given to you by your health care provider. Make sure you discuss any questions you have with your health care provider. ° ° °Emergency Department Resource Guide °1) Find a Doctor and Pay Out of Pocket °Although you won't have to find out who is covered by your insurance plan, it is a good idea to ask around and get recommendations. You will then need to call the office and see if the doctor you have chosen will accept you as a new patient and what types of options they offer for patients who are self-pay. Some doctors offer discounts or will set up payment plans for their patients who do not have insurance, but you will need to ask so you aren't surprised when you get to your appointment. ° °2) Contact Your Local Health Department °Not all health departments have doctors that can see patients for sick visits, but many do, so it is worth a call to see if yours does. If you don't know where your local health department is, you can check in your phone book. The CDC also has a tool to help you locate your state's health department, and many state websites also have listings of all of their local health departments. ° °3) Find a Walk-in Clinic °If your illness is not likely to be very severe or complicated, you may want to try a walk in clinic. These are popping up all over the country in pharmacies, drugstores, and shopping centers. They're usually staffed by nurse practitioners or physician assistants that have been trained to treat common illnesses and complaints. They're usually fairly quick and inexpensive. However, if you have serious medical issues or chronic medical problems, these are probably not your best option. ° °No Primary Care Doctor: °- Call [facility] at  832-8000 - they can help you locate a primary care doctor that  accepts your insurance, provides certain services, etc. °- Physician Referral Service-  1-800-533-3463 ° °Chronic Pain Problems: °Organization         Address  Phone   Notes  °[facility]  (336) 297-2271 Patients need to be referred by their primary care doctor.  ° °Medication Assistance: °Organization         Address  Phone   Notes  °[place] [facility] [address] °[place], [place] [address] (336) 641-8030 --Must be a resident of [place] °-- Must have NO insurance coverage whatsoever (no Medicaid/ Medicare, etc.) °-- The pt. MUST have a primary care doctor that directs their care regularly and follows them in the community °  °[facility]  (866) 331-1348   °[employer]  (888) 892-1162   ° °Agencies that provide inexpensive medical care: °Organization         Address  Phone   Notes  °[facility]  (336) 832-8035   °[facility]    (336) 832-7272   °[facility] [address] °[place], [place] [address] (336) 832-4777   °[facility] [address], °[place] (336) 271-4999   °[facility]    (  336) 373-0678   °[facility]    (336) 272-1050   °[facility] ° [address], [place] Phone:  (336) 832-4444, Fax:  (336) 832-4440 Hours of Operation:  9 am - 6 pm, M-F.  Also accepts Medicaid/Medicare and self-pay.  °[facility] ° [address], [place] Phone: (336) 832-3150, Fax: (336) [phone]. Hours of Operation:  8:30 am - 5:30 pm, M-F.  Also accepts Medicaid and self-pay.  °[facility] [place] [address], [place] Phone: (336) 878-6027   °[facility] [address], [place], [place] (336)723-1848, Ext. 123 Mondays & Thursdays: 7-9 AM.  First 15 patients are seen on a first come, first serve basis. °  ° °Medicaid-accepting [place] Providers: ° °Organization         Address  Phone   Notes  °[facility] [address],  [place] (336) 641-2100 Also accepts self-pay patients.  °[facility] [address], [place] ° (336) 856-9996   °[facility] [address], [place] (336) 288-8857   °[facility] [address] [place] [address], [place] (336) 299-7000   °Veita Bland [address], [place]  ° (336) 373-1557 Only accepts [place] Access Medicaid patients after they have their name applied to their card.  ° °Self-Pay (no insurance) in [place]: ° °Organization         Address  Phone   Notes  °Sickle Cell Patients, [facility] [address], [place] (336) 832-1970   °[facility] [address], [place] (336) 832-4400   °[facility] ° [address], [place] (336) 992-4800   °[facility]/Dr. Osei-Bonsu ° [address] [place] [address], [place] or [address], [place] (336) 841-8500 Phone number for both [place] and [place] locations is the same.  °[facility] [address], [place] (336) 299-0000   °[facility] [address] [place] [address], [place] or [address] (336) 852-7530 °(336) 878-2260   °[facility] [address], [place] (336) 350-1642, phone; (336) 294-5005, fax Sees patients 1st and 3rd Saturday of every month.  Must not qualify for public or private insurance (i.e. Medicaid, Medicare, [place] Health Choice, Veterans' Benefits) • Household income should be no more than 200% of the poverty level •The clinic cannot treat you if you are pregnant or think you are pregnant • Sexually transmitted diseases are not treated at the clinic.  ° ° °Dental Care: °Organization         Address  Phone  Notes  °[facility] [address],  (336) 641-6152 Accepts children up to age 21 who are enrolled in  Medicaid or [place] Health Choice; pregnant women with a Medicaid card; and children who have applied for Medicaid or [place] Health Choice, but were declined, whose parents can pay a reduced fee at time of service.  °[facility] [place]  [address], [place] (336) 641-7733 Accepts children up to age 21 who are enrolled in Medicaid or [place] Health Choice; pregnant women with a Medicaid card; and children who have applied for Medicaid or [place] Health Choice, but were declined, whose parents can pay a   reduced fee at time of service.  °[facility] ° [address], [place] (336) 641-4533 Patients are seen by appointment only. Walk-ins are not accepted. [facility] will see patients 18 years of age and older. °Monday - Tuesday (8am-5pm) °Most Wednesdays (8:30-5pm) °$30 per visit, cash only  °[facility] ° [address], [place] (336) 641-4533 Patients are seen by appointment only. Walk-ins are not accepted. [facility] will see patients 18 years of age and older. °One Wednesday Evening (Monthly: Volunteer Based).  $30 per visit, cash only  °[facility]  (919) 537-3737 for adults; Children under age 4, call Graduate Pediatric Dentistry at ([phone]) [phone]. Children aged 4-14, please call (919) [phone] to request a pediatric application. ° Dental services are provided in all areas of dental care including fillings, crowns and bridges, complete and partial dentures, implants, gum treatment, root canals, and extractions. Preventive care is also provided. Treatment is provided to both adults and children. °Patients are selected via a lottery and there is often a waiting list. °  °[facility] [address], °[place] ° (336) 763-8833 [email or web address] °  °[facility] [address], [place], [place] (336)723-1848, Ext. 123 Second and Fourth Thursday of each month, opens at 6:30  AM; Clinic ends at 9 AM.  Patients are seen on a first-come first-served basis, and a limited number are seen during each clinic.  ° °[facility] ° [address], [place], [place] (336) 723-7904   Eligibility Requirements °You must have lived in [place], [place], or [place] counties for at least the last three months. °  You cannot be eligible for state or federal sponsored healthcare insurance, including Veterans Administration, Medicaid, or Medicare. °  You generally cannot be eligible for healthcare insurance through your employer.  °  How to apply: °Eligibility screenings are held every Tuesday and Wednesday afternoon from 1:00 pm until 4:00 pm. You do not need an appointment for the interview!  °[facility] [address], [place], [place] 336-631-2330   °[facility]  336-342-8273   °[facility]  336-703-3100   °[place] [facility]  336-570-6415   ° °Behavioral Health Resources in the Community: °Intensive Outpatient Programs °Organization         Address  Phone  Notes  °[facility] [address], [place], [place] 336-878-6098   °[facility] [address], [place], [place] 336-832-9800   °[facility] [address], [place], [place] ° 336-882-2125   °[facility] [address],  °[place], [place] 1-800-853-5163 or 336-641-4981   °Substance Abuse Resources °Organization         Address  Phone  Notes  °[facility]  336-882-2125   °[facility]  336-784-9470   °[facility]  336-285-9073   °[facility]  336-845-3988   °Residential & Outpatient Substance Abuse Program  1-800-659-3381   °Psychological Services °Organization         Address  Phone  Notes  °[facility]  336- 832-9600   °[facility]  336- 378-7881   °[facility] [address], [place] 1-800-853-5163 or  336-641-4981   ° °Mobile Crisis Teams °Organization         Address  Phone  Notes  °[facility], Mobile Crisis Care Unit  1-877-626-1772   °  Assertive °[facility] ° [address] [place], [place] 336-834-9664   °Sharon DeEsch [address] °[place] 336-554-5454   ° °Self-Help/Support Groups °Organization         Address  Phone             Notes  °[facility] [place] - variety of support groups  336- 373-1402 Call for more information  °Narcotics Anonymous (NA), [facility] [address], °[place]  2 meetings at this location  ° °Residential Treatment Programs °Organization         Address  Phone  Notes  °[facility] [address],    °[place]  1-866-801-8205   °[facility] ° [address], [place], [place] 704-293-8524   °[facility] [address], [place] 336-845-3988 Admissions: 8am-3pm M-F  °[facility] [address]    °[place], [place] 336-841-1104   °[facility] [address], [place], [place] 336-379-7146   °[facility] [address]  °[place], [place] 336-285-9073   °[facility] Intensive Outpatient [address], [place], [place] 336-852-3033   °[facility] [address]  °[place], [place] 1-877-615-2722 or 336-784-9470   °[facility] [address] [place], [place] 336-227-7417 Accepts Medicaid  °[facility] [address]  ° [place] 1-800-659-3381 Substance Abuse/Addiction Treatment  ° °[place] Behavioral Health Resources °Organization         Address  Phone  Notes  °[facility]  (888) 581-9988   °Julie Brannon, PhD [address] [place], [place]   (336) 349-5553 or (336) 951-0000   °[facility]   [address] °[place], [place] (336) 349-4454   °[facility] [address],  [place], [place] (336) 342-8316 Insurance/Medicaid/sponsorship through [facility]  °[facility] [address]                                    [place], [place] (336) 342-8316 Therapy/tele-psych/case  °[facility] [address]  ° [place], [place] (336) 349-2233    °Dr. Arfeen  (336) 349-4544   °[facility] [place]  [employer] [place] [facility] 1) [address], [place] °2) [address], [place] °3)  [address], [place] (336) 349-3220 °(336) 342-7768 ° °(336) 342-8140   °[place] [facility] (336) 342-1394 or (336) 342-3537 (After Hours)    ° ° ° °
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
## 2014-06-27 NOTE — Progress Notes (Signed)
[facility] consulted EDSW to see patient.
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
## 2014-06-28 ENCOUNTER — Encounter (HOSPITAL_COMMUNITY): Payer: Self-pay | Admitting: Cardiology

## 2014-06-28 ENCOUNTER — Emergency Department (HOSPITAL_COMMUNITY): Admission: EM | Admit: 2014-06-28 | Discharge: 2014-06-28 | Discharge status: Against Medical Advice | Payer: Self-pay

## 2014-06-28 NOTE — ED Notes (Signed)
Pt left her red satchel, a cellphone and 2 cards in the lobby-placed them in a belonging bag with her label on it and placed it behind nurse first desk at this time.
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
## 2014-06-28 NOTE — ED Notes (Signed)
Called pt to triage without response noted from lobby

## 2014-06-29 ENCOUNTER — Emergency Department (HOSPITAL_COMMUNITY): Payer: Self-pay

## 2014-06-29 ENCOUNTER — Emergency Department (HOSPITAL_COMMUNITY)
Admission: EM | Admit: 2014-06-29 | Discharge: 2014-06-30 | Disposition: A | Discharge status: Other Acute Inpatient Hospital | Payer: Federal, State, Local not specified - Other | Attending: Emergency Medicine | Admitting: Emergency Medicine

## 2014-06-29 ENCOUNTER — Encounter (HOSPITAL_COMMUNITY): Payer: Self-pay | Admitting: *Deleted

## 2014-06-29 DIAGNOSIS — F1020 Alcohol dependence, uncomplicated: Secondary | ICD-10-CM

## 2014-06-29 DIAGNOSIS — D649 Anemia, unspecified: Secondary | ICD-10-CM | POA: Insufficient documentation

## 2014-06-29 DIAGNOSIS — Z3202 Encounter for pregnancy test, result negative: Secondary | ICD-10-CM | POA: Insufficient documentation

## 2014-06-29 DIAGNOSIS — F10129 Alcohol abuse with intoxication, unspecified: Secondary | ICD-10-CM

## 2014-06-29 DIAGNOSIS — F10239 Alcohol dependence with withdrawal, unspecified: Secondary | ICD-10-CM | POA: Diagnosis present

## 2014-06-29 DIAGNOSIS — Z880 Allergy status to penicillin: Secondary | ICD-10-CM | POA: Insufficient documentation

## 2014-06-29 DIAGNOSIS — F445 Conversion disorder with seizures or convulsions: Secondary | ICD-10-CM

## 2014-06-29 DIAGNOSIS — R519 Headache, unspecified: Secondary | ICD-10-CM

## 2014-06-29 DIAGNOSIS — F10229 Alcohol dependence with intoxication, unspecified: Secondary | ICD-10-CM | POA: Insufficient documentation

## 2014-06-29 DIAGNOSIS — F10939 Alcohol use, unspecified with withdrawal, unspecified: Secondary | ICD-10-CM | POA: Diagnosis present

## 2014-06-29 DIAGNOSIS — R51 Headache: Secondary | ICD-10-CM | POA: Insufficient documentation

## 2014-06-29 DIAGNOSIS — Z8742 Personal history of other diseases of the female genital tract: Secondary | ICD-10-CM | POA: Insufficient documentation

## 2014-06-29 DIAGNOSIS — Z8719 Personal history of other diseases of the digestive system: Secondary | ICD-10-CM | POA: Insufficient documentation

## 2014-06-29 DIAGNOSIS — Z79899 Other long term (current) drug therapy: Secondary | ICD-10-CM | POA: Insufficient documentation

## 2014-06-29 DIAGNOSIS — R531 Weakness: Secondary | ICD-10-CM | POA: Insufficient documentation

## 2014-06-29 DIAGNOSIS — F419 Anxiety disorder, unspecified: Secondary | ICD-10-CM | POA: Insufficient documentation

## 2014-06-29 DIAGNOSIS — N390 Urinary tract infection, site not specified: Secondary | ICD-10-CM | POA: Insufficient documentation

## 2014-06-29 DIAGNOSIS — Z8679 Personal history of other diseases of the circulatory system: Secondary | ICD-10-CM | POA: Insufficient documentation

## 2014-06-29 DIAGNOSIS — G40909 Epilepsy, unspecified, not intractable, without status epilepticus: Secondary | ICD-10-CM | POA: Insufficient documentation

## 2014-06-29 DIAGNOSIS — F329 Major depressive disorder, single episode, unspecified: Secondary | ICD-10-CM | POA: Insufficient documentation

## 2014-06-29 LAB — URINE MICROSCOPIC-ADD ON

## 2014-06-29 LAB — POC URINE PREG, ED: PREG TEST UR: NEGATIVE

## 2014-06-29 LAB — BASIC METABOLIC PANEL
ANION GAP: 19 — AB (ref 5–15)
BUN: 7 mg/dL (ref 6–23)
CO2: 20 mEq/L (ref 19–32)
Calcium: 8.9 mg/dL (ref 8.4–10.5)
Chloride: 102 mEq/L (ref 96–112)
Creatinine, Ser: 0.47 mg/dL — ABNORMAL LOW (ref 0.50–1.10)
GFR calc non Af Amer: >90 mL/min (ref >90)
Glucose, Bld: 112 mg/dL — ABNORMAL HIGH (ref 70–99)
POTASSIUM: 3.6 meq/L — AB (ref 3.7–5.3)
Sodium: 141 mEq/L (ref 137–147)

## 2014-06-29 LAB — CBC WITH DIFFERENTIAL/PLATELET
BASOS ABS: 0.1 10*3/uL (ref 0.0–0.1)
Basophils Relative: 2 % — ABNORMAL HIGH (ref 0–1)
EOS ABS: 0 10*3/uL (ref 0.0–0.7)
Eosinophils Relative: 0 % (ref 0–5)
HCT: 34.5 % — ABNORMAL LOW (ref 36.0–46.0)
HEMOGLOBIN: 11.4 g/dL — AB (ref 12.0–15.0)
Lymphocytes Relative: 42 % (ref 12–46)
Lymphs Abs: 0.9 10*3/uL (ref 0.7–4.0)
MCH: 31.8 pg (ref 26.0–34.0)
MCHC: 33 g/dL (ref 30.0–36.0)
MCV: 96.1 fL (ref 78.0–100.0)
MONOS PCT: 16 % — AB (ref 3–12)
Monocytes Absolute: 0.4 10*3/uL (ref 0.1–1.0)
NEUTROS ABS: 0.9 10*3/uL — AB (ref 1.7–7.7)
NEUTROS PCT: 40 % — AB (ref 43–77)
PLATELETS: 232 10*3/uL (ref 150–400)
RBC: 3.59 MIL/uL — ABNORMAL LOW (ref 3.87–5.11)
RDW: 17.2 % — AB (ref 11.5–15.5)
WBC: 2.2 10*3/uL — ABNORMAL LOW (ref 4.0–10.5)

## 2014-06-29 LAB — RAPID URINE DRUG SCREEN, HOSP PERFORMED
AMPHETAMINES: NOT DETECTED
Barbiturates: NOT DETECTED
Benzodiazepines: NOT DETECTED
Cocaine: NOT DETECTED
Opiates: NOT DETECTED
Tetrahydrocannabinol: NOT DETECTED

## 2014-06-29 LAB — URINALYSIS, ROUTINE W REFLEX MICROSCOPIC
Bilirubin Urine: NEGATIVE
Glucose, UA: NEGATIVE mg/dL
Ketones, ur: NEGATIVE mg/dL
Nitrite: NEGATIVE
PROTEIN: NEGATIVE mg/dL
Specific Gravity, Urine: 1.008 (ref 1.005–1.030)
UROBILINOGEN UA: 0.2 mg/dL (ref 0.0–1.0)
pH: 5.5 (ref 5.0–8.0)

## 2014-06-29 LAB — ACETAMINOPHEN LEVEL: Acetaminophen (Tylenol), Serum: <15 ug/mL (ref 10–30)

## 2014-06-29 LAB — ETHANOL: ALCOHOL ETHYL (B): 380 mg/dL — AB (ref 0–11)

## 2014-06-29 LAB — SALICYLATE LEVEL: Salicylate Lvl: <2 mg/dL — ABNORMAL LOW (ref 2.8–20.0)

## 2014-06-29 MED ORDER — LORAZEPAM 2 MG/ML IJ SOLN: 0.0000 mg | Freq: Four times a day (QID) | INTRAMUSCULAR | Status: DC

## 2014-06-29 MED ORDER — CEPHALEXIN 500 MG PO CAPS
500.0000 mg | ORAL_CAPSULE | Freq: Three times a day (TID) | ORAL | Status: DC
  Administered 2014-06-29 – 2014-06-30 (×4): 500 mg via ORAL
  Filled 2014-06-29 (×4): qty 1

## 2014-06-29 MED ORDER — LEVETIRACETAM 500 MG PO TABS
1000.0000 mg | ORAL_TABLET | Freq: Every day | ORAL | Status: DC
  Administered 2014-06-30: 1000 mg via ORAL
  Filled 2014-06-29: qty 2

## 2014-06-29 MED ORDER — ONDANSETRON HCL 4 MG PO TABS: 4.0000 mg | ORAL_TABLET | Freq: Three times a day (TID) | ORAL | Status: DC | PRN

## 2014-06-29 MED ORDER — ZOLPIDEM TARTRATE 5 MG PO TABS
5.0000 mg | ORAL_TABLET | Freq: Every evening | ORAL | Status: DC | PRN
  Administered 2014-06-29: 5 mg via ORAL
  Filled 2014-06-29: qty 1

## 2014-06-29 MED ORDER — ACETAMINOPHEN 325 MG PO TABS: 650.0000 mg | ORAL_TABLET | ORAL | Status: DC | PRN

## 2014-06-29 MED ORDER — VITAMIN B-1 100 MG PO TABS
100.0000 mg | ORAL_TABLET | Freq: Every day | ORAL | Status: DC
Start: 2014-06-29 — End: 2014-06-30
  Administered 2014-06-29 – 2014-06-30 (×2): 100 mg via ORAL
  Filled 2014-06-29 (×2): qty 1

## 2014-06-29 MED ORDER — CARVEDILOL 3.125 MG PO TABS
1.5625 mg | ORAL_TABLET | Freq: Two times a day (BID) | ORAL | Status: DC
  Administered 2014-06-29 – 2014-06-30 (×3): 1.5625 mg via ORAL
  Filled 2014-06-29 (×4): qty 0.5

## 2014-06-29 MED ORDER — LORAZEPAM 1 MG PO TABS
0.0000 mg | ORAL_TABLET | Freq: Four times a day (QID) | ORAL | Status: DC
  Administered 2014-06-29: 1 mg via ORAL
  Administered 2014-06-30: 2 mg via ORAL
  Administered 2014-06-30 (×2): 1 mg via ORAL
  Filled 2014-06-29 (×2): qty 1
  Filled 2014-06-29: qty 2

## 2014-06-29 MED ORDER — LORAZEPAM 1 MG PO TABS
0.0000 mg | ORAL_TABLET | Freq: Two times a day (BID) | ORAL | Status: DC
  Filled 2014-06-29: qty 1

## 2014-06-29 MED ORDER — IBUPROFEN 800 MG PO TABS
800.0000 mg | ORAL_TABLET | Freq: Once | ORAL | Status: AC
  Administered 2014-06-29: 800 mg via ORAL
  Filled 2014-06-29: qty 1

## 2014-06-29 MED ORDER — THIAMINE HCL 100 MG/ML IJ SOLN: 100.0000 mg | Freq: Every day | INTRAMUSCULAR | Status: DC

## 2014-06-29 MED ORDER — LORAZEPAM 2 MG/ML IJ SOLN
2.0000 mg | Freq: Once | INTRAMUSCULAR | Status: AC
  Administered 2014-06-29: 2 mg via INTRAMUSCULAR

## 2014-06-29 MED ORDER — FOLIC ACID 1 MG PO TABS
1.0000 mg | ORAL_TABLET | Freq: Every day | ORAL | Status: DC
  Administered 2014-06-29 – 2014-06-30 (×2): 1 mg via ORAL
  Filled 2014-06-29 (×2): qty 1

## 2014-06-29 MED ORDER — IBUPROFEN 200 MG PO TABS: 600.0000 mg | ORAL_TABLET | Freq: Three times a day (TID) | ORAL | Status: DC | PRN

## 2014-06-29 MED ORDER — LORAZEPAM 2 MG/ML IJ SOLN: 0.0000 mg | Freq: Two times a day (BID) | INTRAMUSCULAR | Status: DC

## 2014-06-29 MED ORDER — LORAZEPAM 1 MG PO TABS: 0.0000 mg | ORAL_TABLET | Freq: Two times a day (BID) | ORAL | Status: DC

## 2014-06-29 MED ORDER — LORAZEPAM 2 MG/ML IJ SOLN
INTRAMUSCULAR | Status: AC
  Filled 2014-06-29: qty 1

## 2014-06-29 NOTE — ED Notes (Signed)
Patient with seizure like behavior IV not established Ativan 2 mg pulled out on override and given IM to left deltoid
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
## 2014-06-29 NOTE — BH Assessment (Signed)
[facility] Assessment Progress Note  Spoke with [place], [place], took history of pt. Will see pt presently.

## 2014-06-29 NOTE — BH Assessment (Signed)
Pt expressed desire to her nurse to leave the hospital. Spoke with pt about her concerns regarding treatment and recommendations made for inpt. Pt reports she does not want to continue drinking nor does she want to remain in the hospital, which she feels she can not afford. Discussed referrals being made to seek placement for her. Pt agreed to stay in the hospital tonight while TTS seeks placement.   Lear Ng, [facility] Triage Specialist 06/29/2014 8:17 PM

## 2014-06-29 NOTE — ED Notes (Addendum)
Report received from [facility]. Pt. Alert and oriented in no distress denies SI, HI, AVH and pain. Will continue to monitor for safety. Pt. Instructed to come to me with problems or concerns. Q 15 minute checks continue.

## 2014-06-29 NOTE — ED Notes (Signed)
PIV site established PA at bedside

## 2014-06-29 NOTE — ED Notes (Signed)
Pt awake and trying to get out of bed to leave. Pt reoriented as to why she is here. After redirection pt got back into bed with assistance.

## 2014-06-29 NOTE — ED Notes (Addendum)
Pt reports seizure an hour ago with complaint of posterior headache and tenderness to touch. No abrasion, laceration, or swelling noted on assessment. Pt oriented to self, situation, and place. Pt reports one glass of wine this morning. Pt request detox.

## 2014-06-29 NOTE — ED Notes (Signed)
Patient purposefully pulled out PIV site, "Because I want to go home" Patient is alert and oriented x 4 and cognizant of her behavior and actions  PA aware that multiple attempts were made to place PIV site, as previously noted Per PA, medications (if required) will be given via PO and/or IM route

## 2014-06-29 NOTE — ED Notes (Signed)
Pt pulls off cardiac monitor. Pt states she can not void and states she just wants to go home.
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
## 2014-06-29 NOTE — ED Notes (Signed)
Patient resting in position of comfort with eyes closed s/p admin of Ativan 2 mg IM, see MAR RR WNL--even and unlabored with equal rise and fall of chest Patient in NAD Side rails up, call bell in reach  Patient on monitor

## 2014-06-29 NOTE — ED Notes (Signed)
Multiple attempts by two nursing staff were made to obtain PIV access Patient thrashing around in bed, requiring multiple staff and security to come to bedside to hold patient down This nurse was able to place PIV site in right hand, but due to patient tensing up and moving arm, only blood was able to be obtained Will continue to attempt PIV access

## 2014-06-29 NOTE — ED Notes (Addendum)
Patient well known to this ED for multiple visits r/t pseudoseizures, ETOH abuse and behavioral issues Patient admits to drinking ETOH today, states "One glass of wine" Patient seen here in this ED yesterday for same EMS called due to "seizure like activity" Per EMS, patient "combative and difficult to deal with" when they arrived on scene Patient agitated on arrival to ED Security called and at bedside due to current behavior and history of becoming combative with staff

## 2014-06-29 NOTE — ED Notes (Signed)
Pt sleeping at the moment. Will obtain urine specimen when pt wakes up.

## 2014-06-29 NOTE — ED Notes (Signed)
Pt shaking in bed on left side. Post shaking spell pt attempting to get out of bed, unhooking all monitoring equipment, and conversing with staff. Pt current neuro status unchanged from previous assessment.

## 2014-06-30 ENCOUNTER — Encounter (HOSPITAL_COMMUNITY): Payer: Self-pay

## 2014-06-30 ENCOUNTER — Inpatient Hospital Stay (HOSPITAL_COMMUNITY)
Admission: AD | Admit: 2014-06-30 | Discharge: 2014-07-04 | DRG: 897 | Disposition: A | Discharge status: Home / Self Care | Payer: Federal, State, Local not specified - Other | Source: Intra-hospital | Attending: Psychiatry | Admitting: Psychiatry

## 2014-06-30 DIAGNOSIS — Z62810 Personal history of physical and sexual abuse in childhood: Secondary | ICD-10-CM | POA: Diagnosis present

## 2014-06-30 DIAGNOSIS — G40909 Epilepsy, unspecified, not intractable, without status epilepticus: Secondary | ICD-10-CM | POA: Diagnosis present

## 2014-06-30 DIAGNOSIS — F10239 Alcohol dependence with withdrawal, unspecified: Secondary | ICD-10-CM | POA: Diagnosis present

## 2014-06-30 DIAGNOSIS — Z823 Family history of stroke: Secondary | ICD-10-CM | POA: Diagnosis not present

## 2014-06-30 DIAGNOSIS — Y908 Blood alcohol level of 240 mg/100 ml or more: Secondary | ICD-10-CM | POA: Diagnosis present

## 2014-06-30 DIAGNOSIS — F331 Major depressive disorder, recurrent, moderate: Secondary | ICD-10-CM | POA: Diagnosis present

## 2014-06-30 DIAGNOSIS — F1014 Alcohol abuse with alcohol-induced mood disorder: Secondary | ICD-10-CM

## 2014-06-30 DIAGNOSIS — F411 Generalized anxiety disorder: Secondary | ICD-10-CM | POA: Diagnosis present

## 2014-06-30 DIAGNOSIS — F10930 Alcohol use, unspecified with withdrawal, uncomplicated: Secondary | ICD-10-CM | POA: Diagnosis present

## 2014-06-30 DIAGNOSIS — G4700 Insomnia, unspecified: Secondary | ICD-10-CM | POA: Diagnosis present

## 2014-06-30 DIAGNOSIS — Z833 Family history of diabetes mellitus: Secondary | ICD-10-CM

## 2014-06-30 DIAGNOSIS — F1020 Alcohol dependence, uncomplicated: Secondary | ICD-10-CM | POA: Diagnosis present

## 2014-06-30 DIAGNOSIS — F10230 Alcohol dependence with withdrawal, uncomplicated: Secondary | ICD-10-CM | POA: Insufficient documentation

## 2014-06-30 DIAGNOSIS — K760 Fatty (change of) liver, not elsewhere classified: Secondary | ICD-10-CM | POA: Diagnosis present

## 2014-06-30 MED ORDER — ACETAMINOPHEN 325 MG PO TABS: 650.0000 mg | ORAL_TABLET | ORAL | Status: DC | PRN

## 2014-06-30 MED ORDER — ONDANSETRON HCL 4 MG PO TABS: 4.0000 mg | ORAL_TABLET | Freq: Three times a day (TID) | ORAL | Status: DC | PRN

## 2014-06-30 MED ORDER — IBUPROFEN 600 MG PO TABS: 600.0000 mg | ORAL_TABLET | Freq: Three times a day (TID) | ORAL | Status: DC | PRN

## 2014-06-30 MED ORDER — CEPHALEXIN 500 MG PO CAPS
500.0000 mg | ORAL_CAPSULE | Freq: Three times a day (TID) | ORAL | Status: DC
  Administered 2014-06-30 – 2014-07-02 (×7): 500 mg via ORAL
  Filled 2014-06-30: qty 7
  Filled 2014-06-30: qty 1
  Filled 2014-06-30: qty 7
  Filled 2014-06-30 (×8): qty 1
  Filled 2014-06-30: qty 4
  Filled 2014-06-30 (×4): qty 1
  Filled 2014-06-30: qty 7
  Filled 2014-06-30: qty 1

## 2014-06-30 MED ORDER — VITAMIN B-1 100 MG PO TABS
100.0000 mg | ORAL_TABLET | Freq: Every day | ORAL | Status: DC
  Administered 2014-07-01 – 2014-07-04 (×4): 100 mg via ORAL
  Filled 2014-06-30 (×6): qty 1

## 2014-06-30 MED ORDER — MAGNESIUM HYDROXIDE 400 MG/5ML PO SUSP: 30.0000 mL | Freq: Every day | ORAL | Status: DC | PRN

## 2014-06-30 MED ORDER — CARVEDILOL 3.125 MG PO TABS
1.5625 mg | ORAL_TABLET | Freq: Two times a day (BID) | ORAL | Status: DC
  Administered 2014-07-01 – 2014-07-02 (×4): 1.5625 mg via ORAL
  Administered 2014-07-03: 09:00:00 via ORAL
  Administered 2014-07-03 – 2014-07-04 (×2): 1.5625 mg via ORAL
  Filled 2014-06-30 (×8): qty 0.5
  Filled 2014-06-30: qty 14
  Filled 2014-06-30: qty 0.5
  Filled 2014-06-30: qty 14

## 2014-06-30 MED ORDER — LORAZEPAM 1 MG PO TABS
0.0000 mg | ORAL_TABLET | Freq: Four times a day (QID) | ORAL | Status: AC
  Administered 2014-06-30: 2 mg via ORAL
  Administered 2014-06-30: 1 mg via ORAL
  Filled 2014-06-30: qty 4
  Filled 2014-06-30: qty 1
  Filled 2014-06-30: qty 2

## 2014-06-30 MED ORDER — FOLIC ACID 1 MG PO TABS
1.0000 mg | ORAL_TABLET | Freq: Every day | ORAL | Status: DC
Start: 2014-07-01 — End: 2014-07-04
  Administered 2014-07-01 – 2014-07-04 (×4): 1 mg via ORAL
  Filled 2014-06-30: qty 14
  Filled 2014-06-30 (×5): qty 1

## 2014-06-30 MED ORDER — LEVETIRACETAM 500 MG PO TABS
1000.0000 mg | ORAL_TABLET | Freq: Every day | ORAL | Status: DC
  Administered 2014-07-01 – 2014-07-04 (×4): 1000 mg via ORAL
  Filled 2014-06-30 (×5): qty 2
  Filled 2014-06-30: qty 28

## 2014-06-30 MED ORDER — ALUM & MAG HYDROXIDE-SIMETH 200-200-20 MG/5ML PO SUSP
30.0000 mL | ORAL | Status: DC | PRN
Start: 2014-06-30 — End: 2014-07-04

## 2014-06-30 MED ORDER — TRAZODONE HCL 100 MG PO TABS
100.0000 mg | ORAL_TABLET | Freq: Every day | ORAL | Status: DC
Start: 2014-06-30 — End: 2014-07-03
  Administered 2014-06-30 – 2014-07-02 (×3): 100 mg via ORAL
  Filled 2014-06-30 (×4): qty 1

## 2014-06-30 MED ORDER — LORAZEPAM 1 MG PO TABS
0.0000 mg | ORAL_TABLET | Freq: Two times a day (BID) | ORAL | Status: DC
  Administered 2014-07-01: 2 mg via ORAL
  Administered 2014-07-03: 1 mg via ORAL
  Filled 2014-06-30: qty 1

## 2014-06-30 NOTE — ED Notes (Signed)
Up on the phone 

## 2014-06-30 NOTE — ED Notes (Signed)
[facility] will call back for report 

## 2014-06-30 NOTE — Progress Notes (Signed)
The patient attended group but had very little to share.
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
## 2014-06-30 NOTE — ED Notes (Signed)
Boyfriend into see 

## 2014-06-30 NOTE — ED Notes (Signed)
Ambulatory w/o difficulty to [facility] w/ [place], belongings given to transport service.

## 2014-06-30 NOTE — ED Notes (Signed)
Dr Rupert Stacks NP into see

## 2014-06-30 NOTE — ED Notes (Signed)
Pt ate sandwich prior to med

## 2014-06-30 NOTE — ED Notes (Signed)
Kristen Roberson TTs in w/ patient

## 2014-06-30 NOTE — BH Assessment (Signed)
Kristen Roberson, [facility] at [facility], confirmed adult unit is currently at capacity. Contacted the following facilities for placement:  BED AVAILABLE, FAXED CLINICAL INFORMATION: [facility]  AT CAPACITY: [place], per [name], per [name], per [name], per [name]  NO RESPONSE: [name]   [place], [place], [name] Triage Specialist [phone]

## 2014-06-30 NOTE — Tx Team (Signed)
Initial Interdisciplinary Treatment Plan   PATIENT STRESSORS: Financial difficulties Health problems Medication change or noncompliance Substance abuse   PATIENT STRENGTHS: Capable of independent living General fund of knowledge   PROBLEM LIST: Problem List/Patient Goals Date to be addressed Date deferred Reason deferred Estimated date of resolution  ETOH abuse                                                       DISCHARGE CRITERIA:  Improved stabilization in mood, thinking, and/or behavior Verbal commitment to aftercare and medication compliance Withdrawal symptoms are absent or subacute and managed without 24-hour nursing intervention  PRELIMINARY DISCHARGE PLAN: Attend 12-step recovery group Return to previous living arrangement  PATIENT/FAMIILY INVOLVEMENT: This treatment plan has been presented to and reviewed with the patient, Kristen Roberson, and/or family member, .  The patient and family have been given the opportunity to ask questions and make suggestions.  Migdalia Dk 06/30/2014, 8:07 PM

## 2014-06-30 NOTE — ED Notes (Signed)
Pt reports that she has had the lower abd pain for the past year and that it gets worse when she drinks.  Pt also reports that she has known hemoroids and has rectal bleeding that can be heavy at times.  Pt also reports that she has been having diarrhea.  Pt instructed to notify staff it if reoccurrs.

## 2014-06-30 NOTE — ED Notes (Signed)
Up to the bathroom 

## 2014-06-30 NOTE — Progress Notes (Signed)
Pt is a 38 year old female admitted with ETOH dependence   She reports seizures but all of her workups dont show seizure activity    She is slightly confused and very tremulous but is socializing with peers on the unit   She is talkative and very bright on approach   She reports wanting treatment because she said she does not want to end up like her MOm who just died in [place] of ETOH related illness    She is very somatic and has visited the ED 36 times in the last 6 months  Pt received medications and was offered nourishment   Verbal support given   Admission completed and belongings gone through and placed in appropriate areas   Pt oriented to the unit   Q 15 min checks   Pt is safe and adjusting well

## 2014-06-30 NOTE — BH Assessment (Signed)
Pt accepted to bed 304-01 by Darrick Penna, NP to the services of Dr.Lugo.  RN notified by Probation officer.  Boyce Medici. MSW, LCSW Therapeutic Triage Services-Triage Specialist   Phone: [phone] Fax: [phone]

## 2014-07-01 ENCOUNTER — Encounter (HOSPITAL_COMMUNITY): Payer: Self-pay | Admitting: Psychiatry

## 2014-07-01 DIAGNOSIS — F331 Major depressive disorder, recurrent, moderate: Secondary | ICD-10-CM

## 2014-07-01 DIAGNOSIS — F411 Generalized anxiety disorder: Secondary | ICD-10-CM | POA: Diagnosis present

## 2014-07-01 DIAGNOSIS — F1010 Alcohol abuse, uncomplicated: Secondary | ICD-10-CM

## 2014-07-01 MED ORDER — DISULFIRAM 250 MG PO TABS
250.0000 mg | ORAL_TABLET | Freq: Every day | ORAL | Status: DC
Start: 2014-07-01 — End: 2014-07-04
  Administered 2014-07-01 – 2014-07-04 (×4): 250 mg via ORAL
  Filled 2014-07-01 (×6): qty 1
  Filled 2014-07-01: qty 14

## 2014-07-01 MED ORDER — CITALOPRAM HYDROBROMIDE 20 MG PO TABS
20.0000 mg | ORAL_TABLET | Freq: Every day | ORAL | Status: DC
  Administered 2014-07-02 – 2014-07-04 (×3): 20 mg via ORAL
  Filled 2014-07-01: qty 1
  Filled 2014-07-01: qty 14
  Filled 2014-07-01 (×4): qty 1

## 2014-07-01 NOTE — BHH Suicide Risk Assessment (Signed)
Suicide Risk Assessment  Admission Assessment     Nursing information obtained from:    Demographic factors:    Current Mental Status:    Loss Factors:    Historical Factors:    Risk Reduction Factors:    Total Time spent with patient: 45 minutes  CLINICAL FACTORS:   Depression:   Comorbid alcohol abuse/dependence Alcohol/Substance Abuse/Dependencies  Psychiatric Specialty Exam:     Blood pressure 124/88, pulse 80, temperature 98.1 F (36.7 C), temperature source Oral, resp. rate 16, height 5\' 6"  (1.676 m), weight 62.596 kg (138 lb), last menstrual period 05/31/2014, SpO2 98 %.Body mass index is 22.28 kg/(m^2).  General Appearance: Fairly Groomed  Engineer, water::  Fair  Speech:  Clear and Coherent  Volume:  Normal  Mood:  Anxious and Depressed  Affect:  anxious worried  Thought Process:  Coherent and Goal Directed  Orientation:  Full (Time, Place, and Person)  Thought Content:  events symtpoms worries concerns  Suicidal Thoughts:  No  Homicidal Thoughts:  No  Memory:  Immediate;   Fair Recent;   Fair Remote;   Fair  Judgement:  Fair  Insight:  Present  Psychomotor Activity:  Restlessness  Concentration:  Fair  Recall:  [employer] of Knowledge:NA  Language: Fair  Akathisia:  No  Handed:    AIMS (if indicated):     Assets:  Desire for Improvement Housing Social Support  Sleep:  Number of Hours: 6   Musculoskeletal: Strength & Muscle Tone: within normal limits Gait & Station: normal Patient leans: N/A  COGNITIVE FEATURES THAT CONTRIBUTE TO RISK:  Polarized thinking Thought constriction (tunnel vision)    SUICIDE RISK:   Minimal: No identifiable suicidal ideation.  Patients presenting with no risk factors but with morbid ruminations; may be classified as minimal risk based on the severity of the depressive symptoms  PLAN OF CARE: Supportive approach/coping skills/relapse prevention                               Identify detox needs    Resume Antabuse, states that the Antabuse took away the cravings and the temptation to drink                               Will D/C the Prozac and consider another SSRI (reports the depression is accompanied by a lot of anxiety)                                Explore options for a residential treatment program  I certify that inpatient services furnished can reasonably be expected to improve the patient's condition.  [place] A 07/01/2014, 2:07 PM

## 2014-07-01 NOTE — Progress Notes (Signed)
Psychoeducational Group Note  Date: 07/01/2014 Time: 1015  Group Topic/Focus:  Making Healthy Choices:   The focus of this group is to help patients identify negative/unhealthy choices they were using prior to admission and identify positive/healthier coping strategies to replace them upon discharge.  Participation Level:  Active  Participation Quality:  Appropriate  Affect:  Anxious  Cognitive:  Appropriate  Insight:  Engaged  Engagement in Group:    Additional Comments:    07/01/2014,6:57 PM Montell Leopard, Trixie Rude

## 2014-07-01 NOTE — Progress Notes (Signed)
Psychoeducational Group Note  Date: 07/01/2014 Time: 1015  Group Topic/Focus:  Making Healthy Choices:   The focus of this group is to help patients identify negative/unhealthy choices they were using prior to admission and identify positive/healthier coping strategies to replace them upon discharge.  Participation Level:  Active  Participation Quality:  Appropriate  Affect:  Anxious  Cognitive:  Appropriate  Insight:  Engaged  Engagement in Group:    Additional Comments:    07/01/2014,5:59 PM Adhvik Canady, Trixie Rude

## 2014-07-01 NOTE — Progress Notes (Signed)
[name] is doing better today and says " I feel so much better".  \  A She attends her groups today. She completes her morning assessment and she rates her depression, hopelessness and anxiety "6/5/7" respectively and she says she wants to resume taking antabuse again.   R safety in place.
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
## 2014-07-02 DIAGNOSIS — F10230 Alcohol dependence with withdrawal, uncomplicated: Secondary | ICD-10-CM

## 2014-07-02 DIAGNOSIS — F411 Generalized anxiety disorder: Secondary | ICD-10-CM

## 2014-07-02 LAB — PATHOLOGIST SMEAR REVIEW

## 2014-07-02 NOTE — [facility] Suicide Risk Assessment (Signed)
[facility] INPATIENT:  Family/Significant Other Suicide Prevention Education  Suicide Prevention Education:  Patient Refusal for Family/Significant Other Suicide Prevention Education: The patient Kristen Roberson has refused to provide written consent for family/significant other to be provided Family/Significant Other Suicide Prevention Education during admission and/or prior to discharge.  Physician notified. SPE reviewed with patient and brochure provided.  Bryant Saye, Casimiro Needle 07/02/2014, 4:34 PM

## 2014-07-02 NOTE — Plan of Care (Signed)
Problem: Alteration in mood & ability to function due to Goal: STG-Patient will attend groups Outcome: Progressing Patient is attending groups.
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
## 2014-07-02 NOTE — [facility] Group Notes (Signed)
   [facility] LCSW Aftercare Discharge Planning Group Note  07/02/2014  8:45 AM   Participation Quality: Alert, Appropriate and Oriented  Mood/Affect: Appropriate   Depression Rating: 4  Anxiety Rating: 3  Thoughts of Suicide: Pt denies SI/HI  Will you contract for safety? Yes  Current AVH: Pt denies  Plan for Discharge/Comments: Pt attended discharge planning group and actively participated in group. CSW provided pt with today's workbook. Patient states that she is "fine" today. She requested treatment at [facility], [place] informed patient that there is a waiting list. Patient is agreeable to be put on waiting list to follow up with outpatient services and return home at discharge.  Transportation Means: Pt reports access to transportation  Supports: No supports mentioned at this time  Tilden Fossa, MSW, [place] Social Worker [employer] [phone]

## 2014-07-02 NOTE — Plan of Care (Signed)
Problem: Ineffective individual coping Goal: STG: Patient will remain free from self harm Outcome: Progressing Pt is safe and free of self harm.     

## 2014-07-02 NOTE — Clinical Social Work Note (Signed)
As patient requested, referral faxed to [facility] to be placed on wait list.  Tilden Fossa, MSW, [place] Worker [facility] [phone]

## 2014-07-02 NOTE — Progress Notes (Signed)
Adult Psychoeducational Group Note  Date:  07/02/2014 Time:  10:00AM  Group Topic/Focus: Making Healthy Choices:   The focus of this group is to help patients identify negative/unhealthy choices they were using prior to admission and identify positive/healthier coping strategies to replace them upon discharge.  Participation Level:  Active  Participation Quality:  Appropriate  Affect:  Appropriate  Cognitive:  Appropriate  Insight: Appropriate  Engagement in Group:  Engaged  Modes of Intervention:  Activity and Discussion  Additional Comments:  Pt was active during group. Pt identified "ABC's of Leisure," identifying a positive activity associated with every letter of the alphabet (A for aerobics, B for bad mitten and C for cycling). Pt also participated in the group activity. Pt played the game "Sherrie George" with staff and peers.  Shanika Levings K 07/02/2014, 11:10 AM

## 2014-07-02 NOTE — Progress Notes (Signed)
Patient ID: Kristen Roberson, female   DOB: 08-23-1975, 38 y.o.   MRN: [id number] D: Patient alert and cooperative. Patient stated she is doing well. Pt detoxing from [place] and c/o  anxiety. Pt mood/affect is anxious and sad. Pt denies SI/HI/AVH. No acute distressed noted at this time.   A: Antabuse consent form explain to pt. Medications administered as prescribed. Emotional support given and will continue to monitor pt's progress for stabilization.  R: Antabuse consent signed by pt. Patient remains safe and complaint with medications.

## 2014-07-02 NOTE — Progress Notes (Signed)
Patient ID: Kristen Roberson, female   DOB: 27-Mar-1976, 38 y.o.   MRN: [id number]  D: Pt. Denies SI/HI and A/V Hallucinations to this Probation officer. Patient reports her sleep last night was poor, appetite is good, energy level is normal, and concentration level is good today. Patient does not report any pain or discomfort at this time. Patient's goal is to focus on her future and sobriety. Patient reports she will reach this by focusing, attending groups, and try to stay positive.  A: Support and encouragement provided to the patient. Patient is able to voice her questions and concerns. Patient was given a list of her medications and the times she takes them per request. Scheduled medications administered to patient per physician's orders.  R: Patient is receptive and cooperative. Patient is attending groups and is seen in the milieu speaking with staff and peers. Q15 minute checks are maintained for safety.
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
## 2014-07-02 NOTE — Progress Notes (Signed)
The patient attended the  A.A. Meeting and was appropriate.   

## 2014-07-03 MED ORDER — HYDROXYZINE HCL 25 MG PO TABS
25.0000 mg | ORAL_TABLET | Freq: Four times a day (QID) | ORAL | Status: DC | PRN
Start: 2014-07-03 — End: 2014-07-04
  Filled 2014-07-03: qty 30

## 2014-07-03 MED ORDER — MIRTAZAPINE 30 MG PO TABS
30.0000 mg | ORAL_TABLET | Freq: Every day | ORAL | Status: DC
  Administered 2014-07-03: 30 mg via ORAL
  Filled 2014-07-03: qty 1
  Filled 2014-07-03: qty 14
  Filled 2014-07-03: qty 1

## 2014-07-03 NOTE — Progress Notes (Signed)
[facility] MD Progress Note  07/03/2014 3:28 PM Kristen GROSECLOSE  MRN:  [id number] Subjective:  Kristen Roberson continues to work on getting her life back together. Got very upset when she heard her ex husband made negative comments about her. Upset as states she would not be in this predicament if he would not have cheated on her. Admits she still gets upset about these events. She did not sleep well last night. States the Trazodone caused nightmares. It has done that before Diagnosis:   DSM5: Substance/Addictive Disorders:  Alcohol Related Disorder - Severe (303.90) Depressive Disorders:  Major Depressive Disorder - Moderate (296.22) Total Time spent with patient: 30 minutes  Axis I: Generalized Anxiety Disorder  ADL's:  Intact  Sleep: Poor  Appetite:  Fair   Psychiatric Specialty Exam: Physical Exam  Review of Systems  Constitutional: Negative.   HENT: Negative.   Eyes: Negative.   Respiratory: Negative.   Cardiovascular: Negative.   Gastrointestinal: Negative.   Genitourinary: Negative.   Musculoskeletal: Negative.   Skin: Negative.   Neurological: Negative.   Endo/Heme/Allergies: Negative.   Psychiatric/Behavioral: Positive for substance abuse. The patient is nervous/anxious.     Blood pressure 117/77, pulse 89, temperature 98 F (36.7 C), temperature source Oral, resp. rate 16, height 5\' 6"  (1.676 m), weight 62.596 kg (138 lb), last menstrual period 05/31/2014, SpO2 98 %.Body mass index is 22.28 kg/(m^2).  General Appearance: Fairly Groomed  Engineer, water::  Fair  Speech:  Clear and Coherent  Volume:  Normal  Mood:  Anxious, Depressed and frustrated  Affect:  anxious worried  Thought Process:  Coherent and Goal Directed  Orientation:  Full (Time, Place, and Person)  Thought Content:  events symptoms worries concerns  Suicidal Thoughts:  No  Homicidal Thoughts:  No  Memory:  Immediate;   Fair Recent;   Fair Remote;   Fair  Judgement:  Fair  Insight:  Present  Psychomotor Activity:   Restlessness  Concentration:  Fair  Recall:  [employer] of Knowledge:NA  Language: Fair  Akathisia:  No  Handed:    AIMS (if indicated):     Assets:  Desire for Improvement Housing Social Support  Sleep:  Number of Hours: 6.25   Musculoskeletal: Strength & Muscle Tone: within normal limits Gait & Station: normal Patient leans: N/A  Current Medications: Current Facility-Administered Medications  Medication Dose Route Frequency Provider Last Rate Last Dose  . acetaminophen (TYLENOL) tablet 650 mg  650 mg Oral Q4H PRN Kennedy Bucker, NP      . alum & mag hydroxide-simeth (MAALOX/MYLANTA) 200-200-20 MG/5ML suspension 30 mL  30 mL Oral Q4H PRN Kennedy Bucker, NP      . carvedilol (COREG) tablet 1.5625 mg  1.5625 mg Oral BID WC Kennedy Bucker, NP      . cephALEXin (KEFLEX) capsule 500 mg  500 mg Oral 3 times per day Kennedy Bucker, NP   500 mg at 07/02/14 2223  . citalopram (CELEXA) tablet 20 mg  20 mg Oral Daily Encarnacion Slates, NP   20 mg at 07/03/14 0841  . disulfiram (ANTABUSE) tablet 250 mg  250 mg Oral Daily Encarnacion Slates, NP   250 mg at 07/03/14 9924  . folic acid (FOLVITE) tablet 1 mg  1 mg Oral Daily Kennedy Bucker, NP   1 mg at 07/03/14 2683  . hydrOXYzine (ATARAX/VISTARIL) tablet 25 mg  25 mg Oral Q6H PRN Nicholaus Bloom, MD      . ibuprofen (ADVIL,MOTRIN) tablet 600 mg  600  mg Oral Q8H PRN Kennedy Bucker, NP      . levETIRAcetam (KEPPRA) tablet 1,000 mg  1,000 mg Oral Daily Kennedy Bucker, NP   1,000 mg at 07/03/14 0842  . LORazepam (ATIVAN) tablet 0-4 mg  0-4 mg Oral Q12H Kennedy Bucker, NP   2 mg at 07/01/14 2831  . magnesium hydroxide (MILK OF MAGNESIA) suspension 30 mL  30 mL Oral Daily PRN Kennedy Bucker, NP      . mirtazapine (REMERON) tablet 30 mg  30 mg Oral QHS Nicholaus Bloom, MD      . ondansetron [facility]) tablet 4 mg  4 mg Oral Q8H PRN Kennedy Bucker, NP      . thiamine (VITAMIN B-1) tablet 100 mg  100 mg Oral Daily Kennedy Bucker, NP   100 mg at 07/03/14 5176    Lab  Results: No results found for this or any previous visit (from the past 48 hour(s)).  Physical Findings: AIMS: Facial and Oral Movements Muscles of Facial Expression: None, normal Lips and Perioral Area: None, normal Jaw: None, normal Tongue: None, normal,Extremity Movements Upper (arms, wrists, hands, fingers): None, normal Lower (legs, knees, ankles, toes): None, normal, Trunk Movements Neck, shoulders, hips: None, normal, Overall Severity Severity of abnormal movements (highest score from questions above): None, normal Incapacitation due to abnormal movements: None, normal Patient's awareness of abnormal movements (rate only patient's report): No Awareness, Dental Status Current problems with teeth and/or dentures?: No Does patient usually wear dentures?: No  CIWA:  CIWA-Ar Total: 4 COWS:     Treatment Plan Summary: Daily contact with patient to assess and evaluate symptoms and progress in treatment Medication management  Plan: Supportive approach/coping skills/relapse prevention           D/C the Trazodone (nightmares)           Trial with Remeron 30 mg HS PRN sleep            CBT;mindfulness help to deal with the negative emotions coming from the relationship with her ex Medical Decision Making Problem Points:  New problem, with no additional work-up planned (3) and Review of psycho-social stressors (1) Data Points:  Review of medication regiment & side effects (2) Review of new medications or change in dosage (2)  I certify that inpatient services furnished can reasonably be expected to improve the patient's condition.   Macky Galik A 07/03/2014, 3:28 PM

## 2014-07-03 NOTE — Progress Notes (Signed)
Recreation Therapy Notes  Animal-Assisted Activity/Therapy (AAA/T) Program Checklist/Progress Notes Patient Eligibility Criteria Checklist & Daily Group note for Rec Tx Intervention  Date: 12.15.2015 Time: 2:45pm Location: [address]    AAA/T Program Assumption of Risk Form signed by Patient/ or Parent Legal Guardian yes  Patient is free of allergies or sever asthma yes  Patient reports no fear of animals yes  Patient reports no history of cruelty to animals yes  Patient understands his/her participation is voluntary yes  Patient washes hands before animal contact yes  Patient washes hands after animal contact yes  Behavioral Response: Appropriate   Education: Hand Washing, Appropriate Animal Interaction   Education Outcome: Acknowledges education.   Clinical Observations/Feedback: Patient actively engaged in session, petting therapy dog appropriately and interacting with peers appropriately. Patient shared stories about pets she has had in the past with group.   Laureen Ochs Meyer Dockery, LRT/CTRS  Lane Hacker 07/03/2014 4:29 PM

## 2014-07-03 NOTE — Progress Notes (Signed)
[place] Group Notes:  (Nursing/MHT/Case Management/Adjunct)  Date:  07/03/2014  Time:  2:27 AM  Type of Therapy:  Psychoeducational Skills  Participation Level:  Active  Participation Quality:  Appropriate  Affect:  Appropriate  Cognitive:  Appropriate  Insight:  Appropriate  Engagement in Group:  Engaged  Modes of Intervention:  Activity  Summary of Progress/Problems:  Clint Bolder 07/03/2014, 2:27 AM

## 2014-07-03 NOTE — Plan of Care (Signed)
Problem: Alteration in mood & ability to function due to Goal: STG-Patient will comply with prescribed medication regimen (Patient will comply with prescribed medication regimen)  Outcome: Completed/Met Date Met:  07/03/14 Pt compliant with prescribed medication regimen
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
## 2014-07-03 NOTE — Clinical Social Work Note (Signed)
Patient accepted for a bed at [facility] for 07/04/14.  [facility] to transport around 14:00 tomorrow.

## 2014-07-03 NOTE — Progress Notes (Signed)
Patient ID: Kristen Roberson, female   DOB: 1976/05/29, 38 y.o.   MRN: [id number] D: Patient alert and cooperative. Patient stated she is doing well and hopes to get a bed at [facility]. Pt reports plans to get her life back together and her kids living with her. Pt denies SI/HI/AVH. No acute distressed noted at this time.   A:  Medications administered as prescribed. Emotional support given and will continue to monitor pt's progress.  R: Patient remains safe and complaint with medications.

## 2014-07-03 NOTE — BHH Group Notes (Signed)
[place] LCSW Group Therapy      Feelings About Diagnosis 1:15 - 2:30 PM         07/03/2014 3:01 PM    Type of Therapy:  Group Therapy  Participation Level:  Active  Participation Quality:  Appropriate  Affect:  Appropriate  Cognitive:  Alert and Appropriate  Insight:  Developing/Improving and Engaged  Engagement in Therapy:  Developing/Improving and Engaged  Modes of Intervention:  Discussion, Education, Exploration, Problem-Solving, Rapport Building, Support  Summary of Progress/Problems:  Patient actively participated in group. Patient discussed past and present diagnosis and the effects it has had on  life.  Patient talked about family and society being judgmental and the stigma associated with having a mental health diagnosis.  She shared she has always known something was wrong with her but her life has become out of control.  She shared she now accepts what is going on with her and is willing to do whatever is necessary, including relocating to be closer to her AA group to get better.   Kristen Roberson 07/03/2014  3:01 PM

## 2014-07-03 NOTE — BHH Counselor (Signed)
See PSA completed on paper in shadow chart by weekend CSW.  Needs to be scanned into chart.

## 2014-07-03 NOTE — Progress Notes (Signed)
Pt attended spiritual care group on grief and loss facilitated by counseling intern Martinique Kodee Ravert. Group opened with brief discussion and psycho-social ed around grief and loss in relationships and in relation to self - identifying life patterns, circumstances, changes that cause losses. Established group norm of speaking from own life experience. Group goal of establishing open and affirming space for members to share loss and experience with grief, normalize grief experience and provide psycho social education and grief support.   Faithann was present and engaged with group. Pt exhibited appropriate behavior and affect. Bridgett shared how loss of cousin during teenage years is still impacting her today; stated she and cousin were very close and like "two peas in a pod." Shonica will dream of cousin only when she is very sad and finds this experience comforting. Roshunda disclosed recent separation from husband and subsequently loosing her children, and is having difficulty navigating through this transition.   Martinique Maclovia Uher Counseling Intern

## 2014-07-03 NOTE — Tx Team (Signed)
Interdisciplinary Treatment Plan Update   Date Reviewed:  07/03/2014  Time Reviewed:  9:12 AM  Progress in Treatment:   Attending groups: Yes Participating in groups: Yes Taking medication as prescribed: Yes  Tolerating medication: Yes Family/Significant other contact made:  No, but will ask patient for consent for collateral contact Patient understands diagnosis: Yes, patient understands diagnosis and need for treatment. Discussing patient identified problems/goals with staff: Yes, patient is able to express goals for treatment and discharge. Medical problems stabilized or resolved: Yes Denies suicidal/homicidal ideation: Yes Patient has not harmed self or others: Yes  For review of initial/current patient goals, please see plan of care.  Estimated Length of Stay:  1-2 days  Reasons for Continued Hospitalization:  Anxiety Depression Medication stabilization  New Problems/Goals identified:    Discharge Plan or Barriers:   Home with outpatient follow up to be determined  Additional Comments:  Continue medication stabilization  Patient and CSW reviewed patient's identified goals and treatment plan.  Patient verbalized understanding and agreed to treatment plan.   Attendees:  Patient:  07/03/2014 9:12 AM   Signature:  Gabriel Earing, MD 07/03/2014 9:12 AM  Signature: Carlton Adam, MD 07/03/2014 9:12 AM  Signature:  Eduard Roux, RN 07/03/2014 9:12 AM  Signature:Beverly Danelle Earthly, RN 07/03/2014 9:12 AM  Signature:  Agustina Caroli, NP 07/03/2014 9:12 AM  Signature:  Joette Catching, LCSW 07/03/2014 9:12 AM  Signature:  Erasmo Downer Drinkard, LCSW-A 07/03/2014 9:12 AM  Signature:  Lucinda Dell, Care Coordinator [facility] 07/03/2014 9:12 AM  Signature:  Darrol Angel, RN 07/03/2014 9:12 AM  Signature:  Marcella Dubs, RN 07/03/2014  9:12 AM  Signature:   Lars Pinks, RN [facility] 07/03/2014  9:12 AM  Signature:   07/03/2014  9:12 AM    Scribe for Treatment Team:   Joette Catching,  07/03/2014 9:12  AM

## 2014-07-03 NOTE — Progress Notes (Signed)
D: Patient's affect appropriate to circumstance and mood is anxious. She reported on the self inventory sheet that she's sleeping poorly at night, fair appetite, normal energy level and good ability to concentrate. Patient rates depression "4", feelings of hopelessness "2" and anxiety "5". She's actively participating in group sessions and her goal today is to work on sobriety. In adherence with current medication regimen.  A: Support and encouragement provided to patient. Administered scheduled medications per ordering MD. Monitor Q15 minute checks for safety.  R: Patient receptive. Denies SI/HI and AVH. Patient remains safe on the unit.

## 2014-07-04 DIAGNOSIS — F10230 Alcohol dependence with withdrawal, uncomplicated: Secondary | ICD-10-CM | POA: Insufficient documentation

## 2014-07-04 MED ORDER — HYDROXYZINE HCL 25 MG PO TABS: 25.0000 mg | ORAL_TABLET | Freq: Four times a day (QID) | ORAL | Status: DC | PRN

## 2014-07-04 MED ORDER — MIRTAZAPINE 30 MG PO TABS: 30.0000 mg | ORAL_TABLET | Freq: Every day | ORAL | Status: DC

## 2014-07-04 MED ORDER — CEPHALEXIN 500 MG PO CAPS: 500.0000 mg | ORAL_CAPSULE | Freq: Three times a day (TID) | ORAL | Status: DC

## 2014-07-04 MED ORDER — DISULFIRAM 250 MG PO TABS: 250.0000 mg | ORAL_TABLET | Freq: Every day | ORAL | Status: DC

## 2014-07-04 MED ORDER — CARVEDILOL 3.125 MG PO TABS: 1.5625 mg | ORAL_TABLET | Freq: Two times a day (BID) | ORAL | Status: DC

## 2014-07-04 MED ORDER — CITALOPRAM HYDROBROMIDE 20 MG PO TABS: 20.0000 mg | ORAL_TABLET | Freq: Every day | ORAL | Status: DC

## 2014-07-04 MED ORDER — FOLIC ACID 1 MG PO TABS: 1.0000 mg | ORAL_TABLET | Freq: Every day | ORAL | Status: DC

## 2014-07-04 MED ORDER — LEVETIRACETAM 1000 MG PO TABS: 1000.0000 mg | ORAL_TABLET | Freq: Every day | ORAL | Status: DC

## 2014-07-04 NOTE — Plan of Care (Signed)
Problem: Alteration in mood & ability to function due to Goal: STG-Patient will report withdrawal symptoms Outcome: Progressing Pt is progressing well and denies any withdrawal symptoms

## 2014-07-04 NOTE — Discharge Summary (Signed)
Physician Discharge Summary Note  Patient:  Kristen Roberson is an 38 y.o., female MRN:  [id number] DOB:  [birth year] Patient phone:  [phone] (home)  Patient address:   [place] [address],  Total Time spent with patient: Greater than 30 minutes  Date of Admission:  06/30/2014 Date of Discharge: 07/04/14  Reason for Admission: Alcohol detox/mood stabilization  Discharge Diagnoses: Principal Problem:   Alcohol dependence Active Problems:   MDD (major depressive disorder), recurrent episode, moderate   Alcohol withdrawal syndrome without complication   GAD (generalized anxiety disorder)   Psychiatric Specialty Exam: Physical Exam  Psychiatric: Her speech is normal and behavior is normal. Judgment and thought content normal. Her mood appears not anxious. Her affect is not angry, not blunt, not labile and not inappropriate. Cognition and memory are normal. She does not exhibit a depressed mood.    Review of Systems  Constitutional: Negative.   HENT: Negative.   Eyes: Negative.   Respiratory: Negative.   Cardiovascular: Negative.   Gastrointestinal: Negative.   Genitourinary: Negative.   Musculoskeletal: Negative.   Skin: Negative.   Neurological: Negative.   Endo/Heme/Allergies: Negative.   Psychiatric/Behavioral: Positive for depression (Stable) and substance abuse (Hx alcoholism). Negative for suicidal ideas, hallucinations and memory loss. The patient has insomnia. The patient is not nervous/anxious.     Blood pressure 116/76, pulse 94, temperature 97.7 F (36.5 C), temperature source Oral, resp. rate 18, height 5\' 6"  (1.676 m), weight 62.596 kg (138 lb), last menstrual period 05/31/2014, SpO2 98 %.Body mass index is 22.28 kg/(m^2).  See SRA       Past Psychiatric History: Diagnosis: Alcohol dependence, major depression, recurrent episodes  Hospitalizations: [facility] x 2  Outpatient Care: [facility]  Substance Abuse Care: [employer]   Self-Mutilation: Denies  Suicidal Attempts: Denies attempts  Violent Behaviors: NA   Musculoskeletal: Strength & Muscle Tone: within normal limits Gait & Station: normal Patient leans: N/A  DSM5: Schizophrenia Disorders:  NA Obsessive-Compulsive Disorders:  NA Trauma-Stressor Disorders:  NA Substance/Addictive Disorders:  Alcohol Related Disorder - Severe (303.90) Depressive Disorders:  MDD (major depressive disorder), recurrent episode, moderate Generalized anxiety disorder  Axis Diagnosis:  AXIS I:  Alcohol dependence, MDD (major depressive disorder), recurrent episode, moderate, Generalized anxiety disorder AXIS II:  Deferred AXIS III:   Past Medical History  Diagnosis Date  . Proctitis   . Cysts of both ovaries   . Anemia   . Anxiety   . Blood transfusion without reported diagnosis   . Depression   . Fatty liver 10/05/13  . Cardiac arrest   . Seizures    AXIS IV:  other psychosocial or environmental problems and Substance dependencey,  AXIS V:  63  Level of Care:  OP  Hospital Course:  Shaylie is a 38 year old Caucasian female. She reports, "The ambulance took me to the hospital. Basically, I have been feeling very depressed because I keep having seizure activities. Then it is Christmas, and I am not with my children. They live in [place]. I got very depressed and lonely. I have low self esteem anyway. I started to drink to cope. I was drinking the 2 of the 24 ounce cans of alcohol almost everyday.    Morine was admitted to the hospital with a blood alcohol level of 380. She was also presenting with worsening depression as it was the reasons for her increased alcohol consumption. Jecenia also blamed loneliness & not being with her children as  the reason for her drinking as well. She required mood stabilization as well as detoxification treatments. Aurielle was however, not presenting with harsh substance withdrawal symptoms of alcohol on arrival, as result she received  Ativan regimen on a prn basis to combat any withdrawal symptoms that she may experience. She was also enrolled in the group counseling sessions being offered and held on this unit, she learned coping skills that should help her cope better after discharge to manage her issues without problems. Jazmarie was resumed on all her pertinent home medications for her other pre-existing medical issues. She tolerated her treatment regimen without any adverse effects reported.  Malka was medicated and discharged on Citalopram 20 mg daily for depression, Antabuse ( disulfiram) 250 mg daily for alcoholism, Hydroxyzine 25 mg Q 6 hours for anxiety & Remeron 30 mg Q bedtime for sleep/depression. She is currently showing improved mood and denies any substance withdrawal symptoms. She is now being discharged to the [facility] treatment center in [place], [place]. Upon discharge, she adamantly denies any SIHI, AVH, delusional thoughts, paranoia and or withdrawal symptoms. She received from the [facility], a 14 days worth, supply samples of her [facility] discharge medications. She left [facility] with all personal belongings in no distress. Transportation per [employer].  Consults:  psychiatry  Significant Diagnostic Studies:  labs: CBC with diff, CMP, UDS, toxicology tests, U/A, reports reviewed, no changes  Discharge Vitals:   Blood pressure 116/76, pulse 94, temperature 97.7 F (36.5 C), temperature source Oral, resp. rate 18, height 5\' 6"  (1.676 m), weight 62.596 kg (138 lb), last menstrual period 05/31/2014, SpO2 98 %. Body mass index is 22.28 kg/(m^2). Lab Results:   No results found for this or any previous visit (from the past 72 hour(s)).  Physical Findings: AIMS: Facial and Oral Movements Muscles of Facial Expression: None, normal Lips and Perioral Area: None, normal Jaw: None, normal Tongue: None, normal,Extremity Movements Upper (arms, wrists, hands, fingers): None, normal Lower (legs, knees, ankles, toes): None, normal, Trunk  Movements Neck, shoulders, hips: None, normal, Overall Severity Severity of abnormal movements (highest score from questions above): None, normal Incapacitation due to abnormal movements: None, normal Patient's awareness of abnormal movements (rate only patient's report): No Awareness, Dental Status Current problems with teeth and/or dentures?: No Does patient usually wear dentures?: No  CIWA:  CIWA-Ar Total: 0 COWS:     Psychiatric Specialty Exam: See Psychiatric Specialty Exam and Suicide Risk Assessment completed by Attending Physician prior to discharge.  Discharge destination:  Home  Is patient on multiple antipsychotic therapies at discharge:  No   Has Patient had three or more failed trials of antipsychotic monotherapy by history:  No  Recommended Plan for Multiple Antipsychotic Therapies: NA    Medication List    STOP taking these medications        FLUoxetine 40 MG capsule  Commonly known as:  PROZAC     traZODone 100 MG tablet  Commonly known as:  DESYREL      TAKE these medications      Indication   carvedilol 3.125 MG tablet  Commonly known as:  COREG  Take 0.5 tablets (1.5625 mg total) by mouth 2 (two) times daily with a meal. For high blood pressure   Indication:  Hypertension     cephALEXin 500 MG capsule  Commonly known as:  KEFLEX  Take 1 capsule (500 mg total) by mouth every 8 (eight) hours. For infection   Indication:  Infection     citalopram 20 MG  tablet  Commonly known as:  CELEXA  Take 1 tablet (20 mg total) by mouth daily. For depression   Indication:  Depression     disulfiram 250 MG tablet  Commonly known as:  ANTABUSE  Take 1 tablet (250 mg total) by mouth daily. For alcoholism   Indication:  Excessive Use of Alcohol     folic acid 1 MG tablet  Commonly known as:  FOLVITE  Take 1 tablet (1 mg total) by mouth daily. For low folate   Indication:  Deficiency of Folic Acid in the Diet     hydrOXYzine 25 MG tablet  Commonly known as:   ATARAX/VISTARIL  Take 1 tablet (25 mg total) by mouth every 6 (six) hours as needed for anxiety. For anxiety   Indication:  Tension, Anxiety     levETIRAcetam 1000 MG tablet  Commonly known as:  KEPPRA  Take 1 tablet (1,000 mg total) by mouth daily. For seizure activities   Indication:  Seizure activities     mirtazapine 30 MG tablet  Commonly known as:  REMERON  Take 1 tablet (30 mg total) by mouth at bedtime. For depression/sleep   Indication:  Trouble Sleeping, Major Depressive Disorder           Follow-up Information    Follow up with [facility] On 07/04/2014.   Why:  Patient accepted to [facility] on Wednesday, July 04, 2014.  [facility] will transport around [employer] information:   [address]  [place], [place] [address] [phone]     Follow-up recommendations:  Activity:  As tolerated Diet: As recommended by your primary care doctor. Keep all scheduled follow-up appointments as recommended.  Comments:  Take all your medications as prescribed by your mental healthcare provider. Report any adverse effects and or reactions from your medicines to your outpatient provider promptly. Patient is instructed and cautioned to not engage in alcohol and or illegal drug use while on prescription medicines. In the event of worsening symptoms, patient is instructed to call the crisis hotline, 911 and or go to the nearest ED for appropriate evaluation and treatment of symptoms. Follow-up with your primary care provider for your other medical issues, concerns and or health care needs.   Total Discharge Time:  Greater than 30 minutes.  Signed: Encarnacion Slates, PMHNP-BC 07/04/2014, 9:38 AM  I personally assessed the patient and formulated the plan Geralyn Flash A. Sabra Heck, M.D.

## 2014-07-04 NOTE — Progress Notes (Signed)
Discharge Note: Discharge instructions/prescriptions/medication samples and letter given to patient. Patient verbalized understanding of discharge instructions and prescriptions. Returned belongings to patient. Denies SI/HI/AVH. Patient d/c without incident to the lobby and transported by an Medical sales representative.

## 2014-07-04 NOTE — BHH Group Notes (Signed)
Adult Psychoeducational Group Note  Date:  07/04/2014 Time:  5:57 AM  Group Topic/Focus:  Wrap-Up Group  Participation Level:  Active  Participation Quality:  Appropriate  Affect:  Appropriate  Cognitive:  Appropriate  Insight: Appropriate and Good  Engagement in Group:  Engaged  Modes of Intervention:  Discussion and Support  Additional Comments:  Pt shared that she will be leaving tomorrow for a long-term tx at [facility] and that she had a good day.  States she's ready to move on and get the help she needs but disappointed that she won't get to be home for Christmas.  Rick Duff Coursey 07/04/2014, 5:57 AM

## 2014-07-04 NOTE — BHH Group Notes (Signed)
[facility] LCSW Aftercare Discharge Planning Group Note   07/04/2014 10:52 AM    Participation Quality:  Appropraite  Mood/Affect:  Appropriate  Depression Rating:  2  Anxiety Rating:  5  Thoughts of Suicide:  No  Will you contract for safety?   NA  Current AVH:  No  Plan for Discharge/Comments:  Patient attended discharge planning group and actively participated in group. Patient reports being a little nervous about discharge but ready to go to [facility] later today.  Suicide prevention education reviewed and SPE document provided.   Transportation Means: Patient has transportation.   Supports:  Patient has a support system.   Birdena Kingma, Eulas Post

## 2014-07-04 NOTE — BHH Suicide Risk Assessment (Signed)
Suicide Risk Assessment  Discharge Assessment     Demographic Factors:  Caucasian  Total Time spent with patient: 30 minutes  Psychiatric Specialty Exam:     Blood pressure 116/76, pulse 94, temperature 97.7 F (36.5 C), temperature source Oral, resp. rate 18, height 5\' 6"  (1.676 m), weight 62.596 kg (138 lb), last menstrual period 05/31/2014, SpO2 98 %.Body mass index is 22.28 kg/(m^2).  General Appearance: Fairly Groomed  Engineer, water::  Fair  Speech:  Clear and Coherent  Volume:  Normal  Mood:  Anxious  Affect:  Appropriate  Thought Process:  Coherent and Goal Directed  Orientation:  Full (Time, Place, and Person)  Thought Content:  plans as she moves on, relapse prevention plan  Suicidal Thoughts:  No  Homicidal Thoughts:  No  Memory:  Immediate;   Fair Recent;   Fair Remote;   Fair  Judgement:  Fair  Insight:  Present  Psychomotor Activity:  Normal  Concentration:  Fair  Recall:  [employer] of Knowledge:NA  Language: Fair  Akathisia:  No  Handed:    AIMS (if indicated):     Assets:  Desire for Improvement Housing Social Support  Sleep:  Number of Hours: 6.75    Musculoskeletal: Strength & Muscle Tone: within normal limits Gait & Station: normal Patient leans: N/A   Mental Status Per Nursing Assessment::   On Admission:     Current Mental Status by Physician: In full contact with reality. There are no active S/S of withdrawal. No SI plans or intent. She is going to [facility] to continue to work on her recovery. After she comes out plans to move to another apartment. She plans to focus on getting healthier.   Loss Factors: Loss of significant relationship  Historical Factors: NA  Risk Reduction Factors:   Sense of responsibility to family and Positive social support  Continued Clinical Symptoms:  Depression:   Comorbid alcohol abuse/dependence Alcohol/Substance Abuse/Dependencies  Cognitive Features That Contribute To Risk:   Closed-mindedness Polarized thinking Thought constriction (tunnel vision)    Suicide Risk:  Minimal: No identifiable suicidal ideation.  Patients presenting with no risk factors but with morbid ruminations; may be classified as minimal risk based on the severity of the depressive symptoms  Discharge Diagnoses:   AXIS I:  Alcohol Use Disorder severe, S/P alcohol withdrawal, Major Depression recurrent moderate, GAD AXIS II:  No diagnosis AXIS III:   Past Medical History  Diagnosis Date  . Proctitis   . Cysts of both ovaries   . Anemia   . Anxiety   . Blood transfusion without reported diagnosis   . Depression   . Fatty liver 10/05/13  . Cardiac arrest   . Seizures    AXIS IV:  other psychosocial or environmental problems AXIS V:  61-70 mild symptoms  Plan Of Care/Follow-up recommendations:  Activity:  as tolerated Diet:  regular Follow up [facility]  Is patient on multiple antipsychotic therapies at discharge:  No   Has Patient had three or more failed trials of antipsychotic monotherapy by history:  No  Recommended Plan for Multiple Antipsychotic Therapies: NA    Kannen Moxey A 07/04/2014, 12:32 PM

## 2014-07-04 NOTE — Progress Notes (Signed)
[facility] Adult Case Management Discharge Plan :  Will you be returning to the same living situation after discharge: No. Patient is discharging to [facility]. At discharge, do you have transportation home?:Yes,  [facility] will transport patient to their facility. Do you have the ability to pay for your medications:No.  Patient needs assistance with indigent medications   Release of information consent forms completed and in the chart;  Patient's signature needed at discharge.  Patient to Follow up at: Follow-up Information    Follow up with [facility] On 07/04/2014.   Why:  Patient accepted to [facility] on Wednesday, July 04, 2014.  [facility] will transport around [employer] information:   [address]  [place], [place] [address] [phone]      Patient denies SI/HI:   Patient no longer endorsing SI/HI or other thoughts of self harm.   Safety Planning and Suicide Prevention discussed: .Reviewed with all patients during discharge planning group   Reiley Keisler, Eulas Post 07/04/2014, 10:53 AM

## 2014-07-04 NOTE — BHH Suicide Risk Assessment (Signed)
[facility] INPATIENT:  Family/Significant Other Suicide Prevention Education  Suicide Prevention Education:  Patient Discharged to [place] [facility]:  Suicide Prevention Education Not Provided: {PT. DISCHARGED TO OTHER HEALTHCARE FACILITY:SUICIDE PREVENTION EDUCATION NOT PROVIDED ([facility]):  The patient is discharging to another healthcare facility for continuation of treatment.  The patient's medical information, including suicide ideations and risk factors, are a part of the medical information shared with the receiving healthcare facility.  Patient discharge to [facility].  Kristen Roberson 07/04/2014, 11:04 AM

## 2014-07-06 NOTE — Progress Notes (Signed)
Patient Discharge Instructions:  No documentation was faxed to [facility] for [facility].  Per the SW the information was already sent.  Patsey Berthold, 07/06/2014, 3:40 PM

## 2014-07-17 ENCOUNTER — Encounter (HOSPITAL_COMMUNITY): Payer: Self-pay | Admitting: *Deleted

## 2014-07-17 ENCOUNTER — Emergency Department (HOSPITAL_COMMUNITY)
Admission: EM | Admit: 2014-07-17 | Discharge: 2014-07-17 | Disposition: A | Discharge status: Home / Self Care | Payer: Self-pay | Attending: Emergency Medicine | Admitting: Emergency Medicine

## 2014-07-17 DIAGNOSIS — Z880 Allergy status to penicillin: Secondary | ICD-10-CM | POA: Insufficient documentation

## 2014-07-17 DIAGNOSIS — Z3202 Encounter for pregnancy test, result negative: Secondary | ICD-10-CM | POA: Insufficient documentation

## 2014-07-17 DIAGNOSIS — Z8679 Personal history of other diseases of the circulatory system: Secondary | ICD-10-CM | POA: Insufficient documentation

## 2014-07-17 DIAGNOSIS — G40909 Epilepsy, unspecified, not intractable, without status epilepticus: Secondary | ICD-10-CM | POA: Insufficient documentation

## 2014-07-17 DIAGNOSIS — F329 Major depressive disorder, single episode, unspecified: Secondary | ICD-10-CM | POA: Insufficient documentation

## 2014-07-17 DIAGNOSIS — Z8719 Personal history of other diseases of the digestive system: Secondary | ICD-10-CM | POA: Insufficient documentation

## 2014-07-17 DIAGNOSIS — F419 Anxiety disorder, unspecified: Secondary | ICD-10-CM | POA: Insufficient documentation

## 2014-07-17 DIAGNOSIS — R569 Unspecified convulsions: Secondary | ICD-10-CM

## 2014-07-17 DIAGNOSIS — F1310 Sedative, hypnotic or anxiolytic abuse, uncomplicated: Secondary | ICD-10-CM | POA: Insufficient documentation

## 2014-07-17 DIAGNOSIS — Z862 Personal history of diseases of the blood and blood-forming organs and certain disorders involving the immune mechanism: Secondary | ICD-10-CM | POA: Insufficient documentation

## 2014-07-17 DIAGNOSIS — Z8742 Personal history of other diseases of the female genital tract: Secondary | ICD-10-CM | POA: Insufficient documentation

## 2014-07-17 DIAGNOSIS — Z79899 Other long term (current) drug therapy: Secondary | ICD-10-CM | POA: Insufficient documentation

## 2014-07-17 LAB — COMPREHENSIVE METABOLIC PANEL
ALK PHOS: 125 U/L — AB (ref 39–117)
ALT: 73 U/L — AB (ref 0–35)
AST: 72 U/L — ABNORMAL HIGH (ref 0–37)
Albumin: 4.9 g/dL (ref 3.5–5.2)
Anion gap: 11 (ref 5–15)
BUN: 14 mg/dL (ref 6–23)
CO2: 27 mmol/L (ref 19–32)
Calcium: 8.3 mg/dL — ABNORMAL LOW (ref 8.4–10.5)
Chloride: 103 mEq/L (ref 96–112)
Creatinine, Ser: 0.49 mg/dL — ABNORMAL LOW (ref 0.50–1.10)
GLUCOSE: 96 mg/dL (ref 70–99)
POTASSIUM: 3.7 mmol/L (ref 3.5–5.1)
SODIUM: 141 mmol/L (ref 135–145)
Total Bilirubin: 0.5 mg/dL (ref 0.3–1.2)
Total Protein: 7.8 g/dL (ref 6.0–8.3)

## 2014-07-17 LAB — URINALYSIS, ROUTINE W REFLEX MICROSCOPIC
BILIRUBIN URINE: NEGATIVE
Glucose, UA: NEGATIVE mg/dL
HGB URINE DIPSTICK: NEGATIVE
Ketones, ur: NEGATIVE mg/dL
Leukocytes, UA: NEGATIVE
Nitrite: NEGATIVE
PROTEIN: NEGATIVE mg/dL
Specific Gravity, Urine: 1.013 (ref 1.005–1.030)
UROBILINOGEN UA: 0.2 mg/dL (ref 0.0–1.0)
pH: 7 (ref 5.0–8.0)

## 2014-07-17 LAB — CBC WITH DIFFERENTIAL/PLATELET
BASOS PCT: 3 % — AB (ref 0–1)
Basophils Absolute: 0.1 10*3/uL (ref 0.0–0.1)
Eosinophils Absolute: 0 10*3/uL (ref 0.0–0.7)
Eosinophils Relative: 0 % (ref 0–5)
HCT: 39.7 % (ref 36.0–46.0)
HEMOGLOBIN: 13 g/dL (ref 12.0–15.0)
LYMPHS PCT: 36 % (ref 12–46)
Lymphs Abs: 1.3 10*3/uL (ref 0.7–4.0)
MCH: 31.6 pg (ref 26.0–34.0)
MCHC: 32.7 g/dL (ref 30.0–36.0)
MCV: 96.6 fL (ref 78.0–100.0)
MONO ABS: 0.1 10*3/uL (ref 0.1–1.0)
Monocytes Relative: 4 % (ref 3–12)
NEUTROS PCT: 57 % (ref 43–77)
Neutro Abs: 2.2 10*3/uL (ref 1.7–7.7)
Platelets: 388 10*3/uL (ref 150–400)
RBC: 4.11 MIL/uL (ref 3.87–5.11)
RDW: 15.2 % (ref 11.5–15.5)
WBC: 3.7 10*3/uL — ABNORMAL LOW (ref 4.0–10.5)

## 2014-07-17 LAB — RAPID URINE DRUG SCREEN, HOSP PERFORMED
Amphetamines: NOT DETECTED
BARBITURATES: POSITIVE — AB
Benzodiazepines: NOT DETECTED
COCAINE: NOT DETECTED
Opiates: NOT DETECTED
TETRAHYDROCANNABINOL: NOT DETECTED

## 2014-07-17 LAB — POC URINE PREG, ED: Preg Test, Ur: NEGATIVE

## 2014-07-17 LAB — ETHANOL: Alcohol, Ethyl (B): 362 mg/dL — ABNORMAL HIGH (ref 0–9)

## 2014-07-17 LAB — LIPASE, BLOOD: Lipase: 23 U/L (ref 11–59)

## 2014-07-17 MED ORDER — LORAZEPAM 2 MG/ML IJ SOLN
1.0000 mg | Freq: Once | INTRAMUSCULAR | Status: AC
  Administered 2014-07-17: 1 mg via INTRAVENOUS
  Filled 2014-07-17: qty 1

## 2014-07-17 NOTE — ED Notes (Signed)
Bed: TV15 Expected date:  Expected time:  Means of arrival:  Comments: EMS- seizure, rectal bleeding
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
## 2014-07-17 NOTE — ED Notes (Signed)
Patient became very defensive stating she is being judged. Patient ripped out her iv and was bleeding, refusing to allow pressure to be applied. There was no reasoning with the patient. She is intoxicated and smells of alcohol. Patient wants to leave AMA. Patient's friend at the bedside trying to reason with patient also, but is willing to take her home. Dr. Doy Mince notified.

## 2014-07-17 NOTE — ED Provider Notes (Addendum)
CSN: [id number]     Arrival date & time 07/17/14  1023 History   First MD Initiated Contact with Patient 07/17/14 1040     Chief Complaint  Patient presents with  . Seizures  . 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    Patient is a 38 y.o. female presenting with seizures.  Seizures Seizure activity on arrival: no   Seizure type: generalized convulsions. Preceding symptoms comment:  "stress" Episode characteristics: generalized shaking   Episode characteristics comment:  Right face drawing up, right arm and leg weakness Severity:  Severe Timing:  Clustered Number of seizures this episode:  Pt reports multiple similar episodes over past few days Progression:  Worsening Context: emotional upset   Context comment:  Recently admitted to [facility] for alcohol abuse/detox.  Pt reports being off alcohol.    Past Medical History  Diagnosis Date  . Proctitis   . Cysts of both ovaries   . Anemia   . Anxiety   . Blood transfusion without reported diagnosis   . Depression   . Fatty liver 10/05/13  . Cardiac arrest   . Seizures    Past Surgical History  Procedure Laterality Date  . Ovarian cyst removal    . Laparoscopy N/A 09/28/2013    Procedure: LAPAROSCOPY OPERATIVE;  Surgeon: Terrance Mass, MD;  Location: [place] [facility];  Service: Gynecology;  Laterality: N/A;  . Laparoscopic appendectomy Right 09/28/2013    Procedure: APPENDECTOMY LAPAROSCOPIC;  Surgeon: Terrance Mass, MD;  Location: [place] [facility];  Service: Gynecology;  Laterality: Right;  . Salpingoophorectomy Right 09/28/2013    Procedure: SALPINGO OOPHORECTOMY;  Surgeon: Terrance Mass, MD;  Location: [place] [facility];  Service: Gynecology;  Laterality: Right;  . Colonoscopy N/A 09/30/2013    Procedure: COLONOSCOPY;  Surgeon: Lafayette Dragon, MD;  Location: [facility];   Service: Endoscopy;  Laterality: N/A;  . Esophagogastroduodenoscopy N/A 11/23/2013    Procedure: ESOPHAGOGASTRODUODENOSCOPY (EGD);  Surgeon: Jerene Bears, MD;  Location: [facility];  Service: Endoscopy;  Laterality: N/A;  . Appendectomy    . Left and right heart catheterization with coronary angiogram N/A 02/23/2014    Procedure: LEFT AND RIGHT HEART CATHETERIZATION WITH CORONARY ANGIOGRAM;  Surgeon: Leonie Man, MD;  Location: [facility];  Service: Cardiovascular;  Laterality: N/A;   Family History  Problem Relation Age of Onset  . Diabetes Mother   . Hyperlipidemia Mother   . Stroke Mother   . Diabetes Father    History  Substance Use Topics  . Smoking status: Never Smoker   . Smokeless tobacco: Never Used  . Alcohol Use: Yes   OB History    Gravida Para Term Preterm AB TAB SAB Ectopic Multiple Living   7 3   4  4   3      Review of Systems  Gastrointestinal: Positive for abdominal pain.  Musculoskeletal:       Right hip pain  Neurological: Positive for seizures.  All other systems reviewed and are negative.     Allergies  Morphine and related; Tramadol; and Penicillins  Home Medications   Prior to Admission medications   Medication Sig Start Date End Date Taking? Authorizing Provider  carvedilol (COREG) 3.125 MG tablet Take 0.5 tablets (1.5625 mg total) by mouth 2 (two) times daily with a meal. For high blood pressure 07/04/14  Yes Encarnacion Slates, NP  citalopram (CELEXA)  20 MG tablet Take 1 tablet (20 mg total) by mouth daily. For depression 07/04/14  Yes Encarnacion Slates, NP  disulfiram (ANTABUSE) 250 MG tablet Take 1 tablet (250 mg total) by mouth daily. For alcoholism 07/04/14  Yes Encarnacion Slates, NP  folic acid (FOLVITE) 1 MG tablet Take 1 tablet (1 mg total) by mouth daily. For low folate 07/04/14  Yes Encarnacion Slates, NP  hydrOXYzine (ATARAX/VISTARIL) 25 MG tablet Take 1 tablet (25 mg total) by mouth every 6 (six) hours as needed for anxiety. For anxiety 07/04/14   Yes Encarnacion Slates, NP  levETIRAcetam (KEPPRA) 1000 MG tablet Take 1 tablet (1,000 mg total) by mouth daily. For seizure activities 07/04/14  Yes Encarnacion Slates, NP  mirtazapine (REMERON) 30 MG tablet Take 1 tablet (30 mg total) by mouth at bedtime. For depression/sleep 07/04/14  Yes Encarnacion Slates, NP  cephALEXin (KEFLEX) 500 MG capsule Take 1 capsule (500 mg total) by mouth every 8 (eight) hours. For infection Patient not taking: Reported on 07/17/2014 07/04/14   Encarnacion Slates, NP   BP 121/66 mmHg  Pulse 79  Temp(Src) 98.4 F (36.9 C) (Oral)  Resp 16  SpO2 96%  LMP  (LMP Unknown) Physical Exam  Constitutional: She is oriented to person, place, and time. She appears well-developed and well-nourished. No distress.  disheveled  HENT:  Head: Normocephalic and atraumatic.  Mouth/Throat: Oropharynx is clear and moist.  Eyes: Conjunctivae are normal. Pupils are equal, round, and reactive to light. No scleral icterus.  Neck: Neck supple.  Cardiovascular: Normal rate, regular rhythm, normal heart sounds and intact distal pulses.   No murmur heard. Pulmonary/Chest: Effort normal and breath sounds normal. No stridor. No respiratory distress. She has no rales.  Abdominal: Soft. Bowel sounds are normal. She exhibits no distension. There is no tenderness.  Musculoskeletal: Normal range of motion.  Neurological: She is alert and oriented to person, place, and time.  During interview, right sided facial muscles are intermittently contracted.  Right hand held above the bed with limp wrist.  At other times, she points to objects with a steady right hand.    Skin: Skin is warm and dry. No rash noted.  Psychiatric: Her mood appears anxious.  Nursing note and vitals reviewed.   ED Course  Procedures (including critical care time) Labs Review Labs Reviewed  CBC WITH DIFFERENTIAL - Abnormal; Notable for the following:    WBC 3.7 (*)    Basophils Relative 3 (*)    All other components within normal  limits  COMPREHENSIVE METABOLIC PANEL - Abnormal; Notable for the following:    Creatinine, Ser 0.49 (*)    Calcium 8.3 (*)    AST 72 (*)    ALT 73 (*)    Alkaline Phosphatase 125 (*)    All other components within normal limits  URINALYSIS, ROUTINE W REFLEX MICROSCOPIC - Abnormal; Notable for the following:    APPearance CLOUDY (*)    All other components within normal limits  ETHANOL - Abnormal; Notable for the following:    Alcohol, Ethyl (B) 362 (*)    All other components within normal limits  URINE RAPID DRUG SCREEN (HOSP PERFORMED) - Abnormal; Notable for the following:    Barbiturates POSITIVE (*)    All other components within normal limits  LIPASE, BLOOD  POC URINE PREG, ED    Imaging Review No results found.   EKG Interpretation None      MDM   Final diagnoses:  Seizure-like activity    38 year old female with a history of pseudoseizures and alcohol abuse who presents after several reported seizures. Her friend is with her, who reports finding her on the floor when he came to her house today. That is why he encouraged her to come to the emergency department today. Patient is well known to this emergency department with multiple visits for seizure activity, alcohol abuse, pain complaints, and psychiatric complaints.  She reports that her abdominal pain and right hip pain have been present for about one month. She also reports right-sided arm and leg weakness and right-sided facial weakness with her seizures chronically.  On review of her chart, she has had an extensive workup for seizures including an EEG which was normal a few months ago.  During my initial interview, patient had an episode where she rolled over in bed on her right side began convulsing in a coordinated fashion, opened her eyes and responded to my voice during her convulsions, then sat straight up in bed appearing confused for 1 or 2 seconds before resuming her conversation without further confusion or  postictal symptoms.  This did not appear consistent with a true generalized tonic-clonic seizure.  I also have a low suspicion that her abdominal pain or hip pain, which have both been present for several weeks to months, is representative of an acute intra-abdominal or emergent process. However will obtain screening labs, give Ativan for patient's obvious anxiety, and recheck.  1:47 PM labs unremarkable except for elevated Etoh (pt earlier reported compliance with alcohol cessation.)  She felt better after ativan.  Plan dc home with her friend.    Arbie Cookey, MD 07/17/14 [place], MD 07/17/14 [phone]
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
## 2014-07-17 NOTE — ED Notes (Signed)
Patient was found alert and oriented by EMS, stating that she has been having convulsions. EMS was called last night for the same but patient refused to be transported.

## 2014-07-17 NOTE — ED Notes (Signed)
Kristen Roberson (friend) [phone] cell.

## 2014-07-17 NOTE — ED Notes (Signed)
MD at bedside. 

## 2014-07-19 ENCOUNTER — Emergency Department (HOSPITAL_COMMUNITY)
Admission: EM | Admit: 2014-07-19 | Discharge: 2014-07-19 | Disposition: A | Discharge status: Home / Self Care | Payer: Self-pay | Attending: Emergency Medicine | Admitting: Emergency Medicine

## 2014-07-19 ENCOUNTER — Encounter (HOSPITAL_COMMUNITY): Payer: Self-pay

## 2014-07-19 DIAGNOSIS — W08XXXA Fall from other furniture, initial encounter: Secondary | ICD-10-CM | POA: Insufficient documentation

## 2014-07-19 DIAGNOSIS — Y9389 Activity, other specified: Secondary | ICD-10-CM | POA: Insufficient documentation

## 2014-07-19 DIAGNOSIS — Z9889 Other specified postprocedural states: Secondary | ICD-10-CM | POA: Insufficient documentation

## 2014-07-19 DIAGNOSIS — R002 Palpitations: Secondary | ICD-10-CM | POA: Insufficient documentation

## 2014-07-19 DIAGNOSIS — D649 Anemia, unspecified: Secondary | ICD-10-CM | POA: Insufficient documentation

## 2014-07-19 DIAGNOSIS — F329 Major depressive disorder, single episode, unspecified: Secondary | ICD-10-CM | POA: Insufficient documentation

## 2014-07-19 DIAGNOSIS — F1010 Alcohol abuse, uncomplicated: Secondary | ICD-10-CM

## 2014-07-19 DIAGNOSIS — F419 Anxiety disorder, unspecified: Secondary | ICD-10-CM | POA: Insufficient documentation

## 2014-07-19 DIAGNOSIS — Z8674 Personal history of sudden cardiac arrest: Secondary | ICD-10-CM | POA: Insufficient documentation

## 2014-07-19 DIAGNOSIS — Z8742 Personal history of other diseases of the female genital tract: Secondary | ICD-10-CM | POA: Insufficient documentation

## 2014-07-19 DIAGNOSIS — Y9289 Other specified places as the place of occurrence of the external cause: Secondary | ICD-10-CM | POA: Insufficient documentation

## 2014-07-19 DIAGNOSIS — R251 Tremor, unspecified: Secondary | ICD-10-CM | POA: Insufficient documentation

## 2014-07-19 DIAGNOSIS — R0602 Shortness of breath: Secondary | ICD-10-CM | POA: Insufficient documentation

## 2014-07-19 DIAGNOSIS — Y998 Other external cause status: Secondary | ICD-10-CM | POA: Insufficient documentation

## 2014-07-19 DIAGNOSIS — G40909 Epilepsy, unspecified, not intractable, without status epilepticus: Secondary | ICD-10-CM | POA: Insufficient documentation

## 2014-07-19 DIAGNOSIS — R079 Chest pain, unspecified: Secondary | ICD-10-CM | POA: Insufficient documentation

## 2014-07-19 DIAGNOSIS — S0990XA Unspecified injury of head, initial encounter: Secondary | ICD-10-CM | POA: Insufficient documentation

## 2014-07-19 DIAGNOSIS — Z8719 Personal history of other diseases of the digestive system: Secondary | ICD-10-CM | POA: Insufficient documentation

## 2014-07-19 DIAGNOSIS — Z880 Allergy status to penicillin: Secondary | ICD-10-CM | POA: Insufficient documentation

## 2014-07-19 DIAGNOSIS — R569 Unspecified convulsions: Secondary | ICD-10-CM

## 2014-07-19 LAB — COMPREHENSIVE METABOLIC PANEL
ALBUMIN: 4.6 g/dL (ref 3.5–5.2)
ALT: 51 U/L — AB (ref 0–35)
ANION GAP: 11 (ref 5–15)
AST: 53 U/L — ABNORMAL HIGH (ref 0–37)
Alkaline Phosphatase: 105 U/L (ref 39–117)
BUN: 10 mg/dL (ref 6–23)
CO2: 26 mmol/L (ref 19–32)
Calcium: 8.5 mg/dL (ref 8.4–10.5)
Chloride: 105 mEq/L (ref 96–112)
Creatinine, Ser: 0.46 mg/dL — ABNORMAL LOW (ref 0.50–1.10)
GFR calc Af Amer: >90 mL/min (ref >90)
GFR calc non Af Amer: >90 mL/min (ref >90)
GLUCOSE: 103 mg/dL — AB (ref 70–99)
Potassium: 3.3 mmol/L — ABNORMAL LOW (ref 3.5–5.1)
Sodium: 142 mmol/L (ref 135–145)
Total Bilirubin: 0.6 mg/dL (ref 0.3–1.2)
Total Protein: 7.2 g/dL (ref 6.0–8.3)

## 2014-07-19 LAB — ETHANOL: ALCOHOL ETHYL (B): 364 mg/dL — AB (ref 0–9)

## 2014-07-19 LAB — RAPID URINE DRUG SCREEN, HOSP PERFORMED
Amphetamines: NOT DETECTED
Barbiturates: NOT DETECTED
Benzodiazepines: POSITIVE — AB
COCAINE: NOT DETECTED
Opiates: NOT DETECTED
Tetrahydrocannabinol: NOT DETECTED

## 2014-07-19 LAB — CBG MONITORING, ED: Glucose-Capillary: 92 mg/dL (ref 70–99)

## 2014-07-19 LAB — I-STAT BETA HCG BLOOD, ED (MC, WL, AP ONLY)

## 2014-07-19 NOTE — ED Notes (Addendum)
38 yo with seizures, [facility] witnessed seizure for 3-4 min x2 pt fell of couch hit head on coffee table c/o HA. Passed spinal exam, pupils reactive pt alert and oriented. Pt was anxious during post ictal state, started hyperventilating. Per ems hx of pseudo seizures, appears to be current. Pt having period of unresponsiveness with vitals WDL. Pt will not opens eyes but responds to stimilization. In route 5mg  Versed IM    Vitals 120/74 86 18 resp 98 on 2L

## 2014-07-19 NOTE — Discharge Instructions (Signed)
Avoid alcohol abuse. Call for a follow up appointment with a Family or Primary Care Provider.  Return if Symptoms worsen.   Take medication as prescribed.

## 2014-07-19 NOTE — ED Notes (Signed)
Bed: RC16 Expected date:  Expected time:  Means of arrival:  Comments: Ems- Female seizures

## 2014-07-19 NOTE — ED Provider Notes (Signed)
CSN: [id number]     Arrival date & time 07/19/14  1444 History   None    Chief Complaint  Patient presents with  . Seizures     (Consider location/radiation/quality/duration/timing/severity/associated sxs/prior Treatment) HPI Comments: The patient is a 38 year old female with a past medical history of pseudoseizure disorder, anemia, presenting to the emergency department with a chief complaint of multiple seizures today. Patient reports approximately 5 witnessed seizures today. She reports returned to baseline in between. She reports seizures disorder aggravated by stress. She reports feeling stressed then onset of chest pain and shortness of breath with palpitations. She reports generalized shaking. Denies drug use, EtOH today. Last EtOH last night at 9 PM. Patient reports compliance with Keppra, recently increased to 1000 mg twice a day. She reports Right sided head pain, struck on a coffee table when she fell from a reclined position on a sofa.  Also complains of left shoulder pain, struck on object.  Patient is a 38 y.o. female presenting with seizures. The history is provided by the patient. No language interpreter was used.  Seizures   Past Medical History  Diagnosis Date  . Proctitis   . Cysts of both ovaries   . Anemia   . Anxiety   . Blood transfusion without reported diagnosis   . Depression   . Fatty liver 10/05/13  . Cardiac arrest   . Seizures    Past Surgical History  Procedure Laterality Date  . Ovarian cyst removal    . Laparoscopy N/A 09/28/2013    Procedure: LAPAROSCOPY OPERATIVE;  Surgeon: Terrance Mass, MD;  Location: [place] [facility];  Service: Gynecology;  Laterality: N/A;  . Laparoscopic appendectomy Right 09/28/2013    Procedure: APPENDECTOMY LAPAROSCOPIC;  Surgeon: Terrance Mass, MD;  Location:  [facility];  Service: Gynecology;  Laterality: Right;  . Salpingoophorectomy Right 09/28/2013    Procedure: SALPINGO OOPHORECTOMY;  Surgeon: Terrance Mass, MD;  Location:  [place] [facility];  Service: Gynecology;  Laterality: Right;  . Colonoscopy N/A 09/30/2013    Procedure: COLONOSCOPY;  Surgeon: Lafayette Dragon, MD;  Location: [facility];  Service: Endoscopy;  Laterality: N/A;  . Esophagogastroduodenoscopy N/A 11/23/2013    Procedure: ESOPHAGOGASTRODUODENOSCOPY (EGD);  Surgeon: Jerene Bears, MD;  Location: [facility];  Service: Endoscopy;  Laterality: N/A;  . Appendectomy    . Left and right heart catheterization with coronary angiogram N/A 02/23/2014    Procedure: LEFT AND RIGHT HEART CATHETERIZATION WITH CORONARY ANGIOGRAM;  Surgeon: Leonie Man, MD;  Location: [facility] CATH LAB;  Service: Cardiovascular;  Laterality: N/A;   Family History  Problem Relation Age of Onset  . Diabetes Mother   . Hyperlipidemia Mother   . Stroke Mother   . Diabetes Father    History  Substance Use Topics  . Smoking status: Never Smoker   . Smokeless tobacco: Never Used  . Alcohol Use: Yes   OB History    Gravida Para Term Preterm AB TAB SAB Ectopic Multiple Living   7 3   4  4   3      Review of Systems  Constitutional: Negative for fever and chills.  Eyes: Negative for photophobia and visual disturbance.  Gastrointestinal: Negative for vomiting and abdominal pain.  Musculoskeletal: Positive for arthralgias.  Skin: Negative for color change and wound.  Neurological: Positive for seizures and headaches.      Allergies  Morphine and related; Tramadol; and Penicillins  Home Medications   Prior to Admission medications   Medication Sig Start Date  End Date Taking? Authorizing Provider  carvedilol (COREG) 3.125 MG tablet Take 0.5 tablets (1.5625 mg total) by mouth 2 (two) times daily with a meal. For high blood pressure 07/04/14   Encarnacion Slates, NP  cephALEXin (KEFLEX) 500 MG capsule Take 1 capsule (500 mg total) by mouth every 8 (eight) hours. For infection Patient not taking: Reported on 07/17/2014 07/04/14   Encarnacion Slates, NP  citalopram (CELEXA) 20 MG tablet Take 1 tablet  (20 mg total) by mouth daily. For depression 07/04/14   Encarnacion Slates, NP  disulfiram (ANTABUSE) 250 MG tablet Take 1 tablet (250 mg total) by mouth daily. For alcoholism 07/04/14   Encarnacion Slates, NP  folic acid (FOLVITE) 1 MG tablet Take 1 tablet (1 mg total) by mouth daily. For low folate 07/04/14   Encarnacion Slates, NP  hydrOXYzine (ATARAX/VISTARIL) 25 MG tablet Take 1 tablet (25 mg total) by mouth every 6 (six) hours as needed for anxiety. For anxiety 07/04/14   Encarnacion Slates, NP  levETIRAcetam (KEPPRA) 1000 MG tablet Take 1 tablet (1,000 mg total) by mouth daily. For seizure activities 07/04/14   Encarnacion Slates, NP  mirtazapine (REMERON) 30 MG tablet Take 1 tablet (30 mg total) by mouth at bedtime. For depression/sleep 07/04/14   Encarnacion Slates, NP   BP 109/59 mmHg  Pulse 84  Resp 15  SpO2 95%  LMP  (LMP Unknown) Physical Exam  Constitutional: She is oriented to person, place, and time. She appears well-developed and well-nourished.  HENT:  Head: Normocephalic. Head is without raccoon's eyes, without Battle's sign and without laceration.    Right Ear: Tympanic membrane normal.  Left Ear: Tympanic membrane normal.  Eyes: Conjunctivae and EOM are normal. Pupils are equal, round, and reactive to light.  Neck: Normal range of motion. Neck supple.  Cardiovascular: Normal rate and regular rhythm.   Pulmonary/Chest: Effort normal and breath sounds normal. She has no wheezes. She has no rales.  Abdominal: Soft. There is no tenderness. There is no rebound.  Neurological: She is alert and oriented to person, place, and time. She is not disoriented. GCS eye subscore is 4. GCS verbal subscore is 5. GCS motor subscore is 6.  Appears intoxicated. 4/5 right upper and lower extremity strength.  Speech is slightly shower but clear and goal oriented, follows commands II-Visual fields were normal.   III/IV/VI-Pupils were equal and reacted. Extraocular movements were full and conjugate.  V/VII-no facial  droop.   VIII-normal.   Motor: Strength 5/5 to upper and lower extremities bilaterally. Moves all 4 extremities equally. Sensory: normal sensation to upper and lower extremities.  Cerebellar: Normal finger to nose bilaterally No pronator drift.   Skin: Skin is warm and dry.  Nursing note and vitals reviewed.   ED Course  Procedures (including critical care time) Labs Review Labs Reviewed  COMPREHENSIVE METABOLIC PANEL - Abnormal; Notable for the following:    Potassium 3.3 (*)    Glucose, Bld 103 (*)    Creatinine, Ser 0.46 (*)    AST 53 (*)    ALT 51 (*)    All other components within normal limits  URINE RAPID DRUG SCREEN (HOSP PERFORMED) - Abnormal; Notable for the following:    Benzodiazepines POSITIVE (*)    All other components within normal limits  ETHANOL - Abnormal; Notable for the following:    Alcohol, Ethyl (B) 364 (*)    All other components within normal limits  CBG MONITORING, ED  I-STAT  BETA HCG BLOOD, ED ([facility], [facility], [facility] ONLY)    Imaging Review No results found.   EKG Interpretation None      MDM   Final diagnoses:  Alcohol abuse  Seizure-like activity   Pt with history of psudoseizure presents with witnessed seixure like activity, followed by neuro. reports compliance with anti-seizure medication.  Patient has mild decrease in right upper extremity and lower strength, she reports is chronic. Pt with 32 visits in the last 6 months.  Thorough workup in the past for similar symptoms.  Dr. Ashok Cordia also evaluated the patient during this encounter. 1700 Re-eval pt alert oriented. Pt observed holding a phone in her right hand without dropping it.  Reports left shoulder pain. No other complaints. EtOH elevated at 364 1830 Re-eval patient to be discharged, friend at bedside reports patient was alert with seizure-like activity, returned to baseline inbetween. Patient admits to EtOH use today. Plan to discharge home follow-up with PCP, encouraged patient to  decrease alcohol intake. Patient will be discharged with friend to home.    Harvie Heck, PA-C 07/19/14 [place], MD 07/19/14 [phone]

## 2014-07-20 ENCOUNTER — Emergency Department (HOSPITAL_COMMUNITY)
Admission: EM | Admit: 2014-07-20 | Discharge: 2014-07-20 | Discharge status: Against Medical Advice | Payer: Self-pay | Attending: Emergency Medicine | Admitting: Emergency Medicine

## 2014-07-20 ENCOUNTER — Encounter (HOSPITAL_COMMUNITY): Payer: Self-pay | Admitting: Emergency Medicine

## 2014-07-20 DIAGNOSIS — IMO0002 Reserved for concepts with insufficient information to code with codable children: Secondary | ICD-10-CM

## 2014-07-20 DIAGNOSIS — F32A Depression, unspecified: Secondary | ICD-10-CM

## 2014-07-20 DIAGNOSIS — R300 Dysuria: Secondary | ICD-10-CM | POA: Insufficient documentation

## 2014-07-20 DIAGNOSIS — F1010 Alcohol abuse, uncomplicated: Secondary | ICD-10-CM | POA: Insufficient documentation

## 2014-07-20 DIAGNOSIS — F329 Major depressive disorder, single episode, unspecified: Secondary | ICD-10-CM | POA: Insufficient documentation

## 2014-07-20 DIAGNOSIS — Z7289 Other problems related to lifestyle: Secondary | ICD-10-CM

## 2014-07-20 LAB — URINALYSIS, ROUTINE W REFLEX MICROSCOPIC
Bilirubin Urine: NEGATIVE
GLUCOSE, UA: NEGATIVE mg/dL
Ketones, ur: NEGATIVE mg/dL
Nitrite: NEGATIVE
PROTEIN: NEGATIVE mg/dL
Specific Gravity, Urine: 1.019 (ref 1.005–1.030)
Urobilinogen, UA: 0.2 mg/dL (ref 0.0–1.0)
pH: 5.5 (ref 5.0–8.0)

## 2014-07-20 LAB — CBC WITH DIFFERENTIAL/PLATELET
BASOS ABS: 0.1 10*3/uL (ref 0.0–0.1)
Basophils Relative: 2 % — ABNORMAL HIGH (ref 0–1)
EOS PCT: 0 % (ref 0–5)
Eosinophils Absolute: 0 10*3/uL (ref 0.0–0.7)
HCT: 37.8 % (ref 36.0–46.0)
Hemoglobin: 12.2 g/dL (ref 12.0–15.0)
Lymphocytes Relative: 36 % (ref 12–46)
Lymphs Abs: 1.3 10*3/uL (ref 0.7–4.0)
MCH: 30.9 pg (ref 26.0–34.0)
MCHC: 32.3 g/dL (ref 30.0–36.0)
MCV: 95.7 fL (ref 78.0–100.0)
MONOS PCT: 7 % (ref 3–12)
Monocytes Absolute: 0.2 10*3/uL (ref 0.1–1.0)
NEUTROS ABS: 2 10*3/uL (ref 1.7–7.7)
NEUTROS PCT: 55 % (ref 43–77)
Platelets: 275 10*3/uL (ref 150–400)
RBC: 3.95 MIL/uL (ref 3.87–5.11)
RDW: 15.1 % (ref 11.5–15.5)
WBC: 3.6 10*3/uL — ABNORMAL LOW (ref 4.0–10.5)

## 2014-07-20 LAB — COMPREHENSIVE METABOLIC PANEL
ALBUMIN: 4.2 g/dL (ref 3.5–5.2)
ALT: 44 U/L — AB (ref 0–35)
ANION GAP: 8 (ref 5–15)
AST: 53 U/L — ABNORMAL HIGH (ref 0–37)
Alkaline Phosphatase: 111 U/L (ref 39–117)
BUN: 10 mg/dL (ref 6–23)
CALCIUM: 8.6 mg/dL (ref 8.4–10.5)
CO2: 26 mmol/L (ref 19–32)
CREATININE: 0.43 mg/dL — AB (ref 0.50–1.10)
Chloride: 107 mEq/L (ref 96–112)
GLUCOSE: 143 mg/dL — AB (ref 70–99)
Potassium: 4.3 mmol/L (ref 3.5–5.1)
Sodium: 141 mmol/L (ref 135–145)
Total Bilirubin: 0.3 mg/dL (ref 0.3–1.2)
Total Protein: 7.1 g/dL (ref 6.0–8.3)

## 2014-07-20 LAB — URINE MICROSCOPIC-ADD ON

## 2014-07-20 LAB — RAPID URINE DRUG SCREEN, HOSP PERFORMED
Amphetamines: NOT DETECTED
Barbiturates: POSITIVE — AB
Benzodiazepines: POSITIVE — AB
Cocaine: NOT DETECTED
OPIATES: NOT DETECTED
Tetrahydrocannabinol: NOT DETECTED

## 2014-07-20 LAB — ETHANOL: Alcohol, Ethyl (B): 318 mg/dL — ABNORMAL HIGH (ref 0–9)

## 2014-07-20 LAB — SALICYLATE LEVEL: Salicylate Lvl: <4 mg/dL (ref 2.8–20.0)

## 2014-07-20 LAB — PREGNANCY, URINE: Preg Test, Ur: NEGATIVE

## 2014-07-20 MED ORDER — ACETAMINOPHEN 500 MG PO TABS
1000.0000 mg | ORAL_TABLET | Freq: Once | ORAL | Status: AC
  Administered 2014-07-20: 1000 mg via ORAL
  Filled 2014-07-20: qty 2

## 2014-07-20 MED ORDER — ONDANSETRON 4 MG PO TBDP
4.0000 mg | ORAL_TABLET | Freq: Once | ORAL | Status: AC
  Administered 2014-07-20: 4 mg via ORAL
  Filled 2014-07-20: qty 1

## 2014-07-20 NOTE — ED Notes (Addendum)
Per [employer]-patient's "friend" [name] called them today saying that patient called him saying "I'm going to kill myself, I'm going to do it today." [name] is currently living at [facility] but is helping pt out with finances. [name] is attempting to have patient placed in local behavioral health center [place]. [employer] at bedside.

## 2014-07-20 NOTE — ED Notes (Addendum)
Patient brought in by [employer] c/o seizure like activity. Having headache and is feeling nauseous. Also c/o burning with urination and says "I am unable to pee because of the pain." Has noted cut marks to bilateral wrists. Is tearful and saying "I just lost my mom and I miss my husband and my daughter." Denies SI/HI. Neurologically intact. Alert and speaking full-clear sentences. Alert to situation, place and self but unsure of year. Able to say it "is New Years." Moving all extremities.

## 2014-07-20 NOTE — ED Notes (Signed)
Pt states that she needed to go outside to talk to her friend that just arrived. Informed pt that she could not leave the building during treatment. Pt was adamant and left anyway.

## 2014-07-20 NOTE — ED Notes (Signed)
Patient refused blood draw for labs. RN made aware

## 2014-07-20 NOTE — ED Provider Notes (Signed)
CSN: [id number]     Arrival date & time 07/20/14  1525 History   First MD Initiated Contact with Patient 07/20/14 1552     Chief Complaint  Patient presents with  . Seizure-like activity      (Consider location/radiation/quality/duration/timing/severity/associated sxs/prior Treatment) HPI Comments: The patient is a 39 year old female with past history of pseudoseizure, alcohol abuse, presenting to the emergency department chief complaint of requesting detox, increase in depression.  Patient reports increasing depression with suicidal ideations, no specific plan. Patient was noted to have new abrasion to right wrist, self-inflicted. Patient also requesting detox, last alcohol use this morning, reports drinking a 4 Loco like alcoholic beverage. She reports taking her Antabuse after consuming alcohol with multiple episodes of biliary emesis. Patient reports increase in stress due to divorce, family death. She reports dysuria onset today. Left ear pain today. Patient reports a seizure while in route to ED, [employer] reports seizure-like activity.  Patient denies harm to others, auditory or visual hallucinations, other drug use.  The history is provided by the patient. No language interpreter was used.    Past Medical History  Diagnosis Date  . Proctitis   . Cysts of both ovaries   . Anemia   . Anxiety   . Blood transfusion without reported diagnosis   . Depression   . Fatty liver 10/05/13  . Cardiac arrest   . Seizures    Past Surgical History  Procedure Laterality Date  . Ovarian cyst removal    . Laparoscopy N/A 09/28/2013    Procedure: LAPAROSCOPY OPERATIVE;  Surgeon: Terrance Mass, MD;  Location: [facility];  Service: Gynecology;  Laterality: N/A;  . Laparoscopic appendectomy Right 09/28/2013    Procedure: APPENDECTOMY LAPAROSCOPIC;  Surgeon: Terrance Mass, MD;  Location: [facility];  Service: Gynecology;  Laterality: Right;  . Salpingoophorectomy Right 09/28/2013    Procedure: SALPINGO  OOPHORECTOMY;  Surgeon: Terrance Mass, MD;  Location: [facility];  Service: Gynecology;  Laterality: Right;  . Colonoscopy N/A 09/30/2013    Procedure: COLONOSCOPY;  Surgeon: Lafayette Dragon, MD;  Location: [facility];  Service: Endoscopy;  Laterality: N/A;  . Esophagogastroduodenoscopy N/A 11/23/2013    Procedure: ESOPHAGOGASTRODUODENOSCOPY (EGD);  Surgeon: Jerene Bears, MD;  Location: [facility];  Service: Endoscopy;  Laterality: N/A;  . Appendectomy    . Left and right heart catheterization with coronary angiogram N/A 02/23/2014    Procedure: LEFT AND RIGHT HEART CATHETERIZATION WITH CORONARY ANGIOGRAM;  Surgeon: Leonie Man, MD;  Location: [facility];  Service: Cardiovascular;  Laterality: N/A;   Family History  Problem Relation Age of Onset  . Diabetes Mother   . Hyperlipidemia Mother   . Stroke Mother   . Diabetes Father    History  Substance Use Topics  . Smoking status: Never Smoker   . Smokeless tobacco: Never Used  . Alcohol Use: Yes   OB History    Gravida Para Term Preterm AB TAB SAB Ectopic Multiple Living   7 3   4  4   3      Review of Systems  Constitutional: Negative for fever and chills.  HENT: Positive for ear pain.   Gastrointestinal: Positive for nausea, vomiting and abdominal pain. Negative for constipation and blood in stool.  Genitourinary: Positive for dysuria and urgency.  Neurological: Positive for headaches.  Psychiatric/Behavioral: Positive for suicidal ideas, self-injury and dysphoric mood. Negative for hallucinations.      Allergies  Morphine and related; Tramadol; and Penicillins  Home Medications  Prior to Admission medications   Medication Sig Start Date End Date Taking? Authorizing Provider  carvedilol (COREG) 3.125 MG tablet Take 0.5 tablets (1.5625 mg total) by mouth 2 (two) times daily with a meal. For high blood pressure 07/04/14   Encarnacion Slates, NP  cephALEXin (KEFLEX) 500 MG capsule Take 1 capsule (500 mg total) by mouth every 8  (eight) hours. For infection Patient not taking: Reported on 07/17/2014 07/04/14   Encarnacion Slates, NP  citalopram (CELEXA) 20 MG tablet Take 1 tablet (20 mg total) by mouth daily. For depression 07/04/14   Encarnacion Slates, NP  disulfiram (ANTABUSE) 250 MG tablet Take 1 tablet (250 mg total) by mouth daily. For alcoholism 07/04/14   Encarnacion Slates, NP  folic acid (FOLVITE) 1 MG tablet Take 1 tablet (1 mg total) by mouth daily. For low folate 07/04/14   Encarnacion Slates, NP  hydrOXYzine (ATARAX/VISTARIL) 25 MG tablet Take 1 tablet (25 mg total) by mouth every 6 (six) hours as needed for anxiety. For anxiety 07/04/14   Encarnacion Slates, NP  levETIRAcetam (KEPPRA) 1000 MG tablet Take 1 tablet (1,000 mg total) by mouth daily. For seizure activities 07/04/14   Encarnacion Slates, NP  mirtazapine (REMERON) 30 MG tablet Take 1 tablet (30 mg total) by mouth at bedtime. For depression/sleep 07/04/14   Encarnacion Slates, NP  topiramate (TOPAMAX) 25 MG tablet Take 25 mg by mouth 2 (two) times daily. Take 1 tablet daily for 1 week. Then, take 2 tablets daily.    Historical Provider, MD   BP 113/84 mmHg  Pulse 100  Temp(Src) 98 F (36.7 C) (Oral)  Resp 18  SpO2 100%  LMP  (LMP Unknown) Physical Exam  Constitutional: She is oriented to person, place, and time. She appears well-developed and well-nourished. No distress.  HENT:  Head: Normocephalic.    Right Ear: Tympanic membrane normal. No hemotympanum.  Left ear tragus tenderness  Eyes: Pupils are equal, round, and reactive to light.  Neck: Neck supple.  Cardiovascular: Normal rate and regular rhythm.   Pulmonary/Chest: Effort normal. No respiratory distress. She has no wheezes. She has no rales.  Abdominal: Soft. There is tenderness in the suprapubic area. There is no rebound and no guarding.  Neurological: She is alert and oriented to person, place, and time. GCS eye subscore is 4. GCS verbal subscore is 5. GCS motor subscore is 6.  Appears intoxicated. Speech is  clear and goal oriented, follows commands II-Visual fields were normal.   III/IV/VI-Pupils were equal and reacted. Nyspagmus present. V/VII-no facial droop.   VIII-normal.   Motor: Moves all 4 extremities equally. Sensory: normal sensation to upper and lower extremities.  Cerebellar: Normal finger to nose bilaterally No pronator drift.  Skin: Skin is warm. She is diaphoretic.  Nursing note and vitals reviewed.   ED Course  Procedures (including critical care time) Labs Review Labs Reviewed  CBC WITH DIFFERENTIAL - Abnormal; Notable for the following:    WBC 3.6 (*)    Basophils Relative 2 (*)    All other components within normal limits  COMPREHENSIVE METABOLIC PANEL - Abnormal; Notable for the following:    Glucose, Bld 143 (*)    Creatinine, Ser 0.43 (*)    AST 53 (*)    ALT 44 (*)    All other components within normal limits  URINE RAPID DRUG SCREEN (HOSP PERFORMED) - Abnormal; Notable for the following:    Benzodiazepines POSITIVE (*)    Barbiturates  POSITIVE (*)    All other components within normal limits  ETHANOL - Abnormal; Notable for the following:    Alcohol, Ethyl (B) 318 (*)    All other components within normal limits  URINALYSIS, ROUTINE W REFLEX MICROSCOPIC - Abnormal; Notable for the following:    APPearance CLOUDY (*)    Hgb urine dipstick MODERATE (*)    Leukocytes, UA SMALL (*)    All other components within normal limits  URINE MICROSCOPIC-ADD ON - Abnormal; Notable for the following:    Squamous Epithelial / LPF MANY (*)    All other components within normal limits  SALICYLATE LEVEL  PREGNANCY, URINE    Imaging Review No results found.   EKG Interpretation None      MDM   Final diagnoses:  ETOH abuse  Depression  Injury, self-inflicted  Dysuria   Patient presents for detox for EtOH and increasing depression with suicidal ideations, no specific plan. No HI, SI.  Patient appears intoxicated, I personally evaluated the patient last  night and had a blood alcohol greater than 360. Patient is not medically cleared due to likely intoxication, and is complaining of low abdominal pain and urinary symptoms will check a UA.  1820 Reevaluation, phlebotomy at bedside.  EtOH 318.  UA shows elevated WBC, rare bacteria, will treat given symptoms.  2045 Per RN note the patient left the ED prior to medical clearance or discussion of lab results with the patient.  EDP was unaware of the patient leaving the hospital.  Harvie Heck, PA-C 07/20/14 [place], MD 07/21/14 [phone]

## 2014-07-23 ENCOUNTER — Ambulatory Visit: Payer: Self-pay | Admitting: Internal Medicine

## 2014-07-27 ENCOUNTER — Encounter (HOSPITAL_COMMUNITY): Payer: Self-pay | Admitting: Emergency Medicine

## 2014-07-27 ENCOUNTER — Emergency Department (HOSPITAL_COMMUNITY)
Admission: EM | Admit: 2014-07-27 | Discharge: 2014-07-27 | Disposition: A | Discharge status: Home / Self Care | Payer: Self-pay | Attending: Emergency Medicine | Admitting: Emergency Medicine

## 2014-07-27 ENCOUNTER — Emergency Department (HOSPITAL_COMMUNITY): Payer: Self-pay

## 2014-07-27 DIAGNOSIS — Z8719 Personal history of other diseases of the digestive system: Secondary | ICD-10-CM | POA: Insufficient documentation

## 2014-07-27 DIAGNOSIS — Z880 Allergy status to penicillin: Secondary | ICD-10-CM | POA: Insufficient documentation

## 2014-07-27 DIAGNOSIS — G40909 Epilepsy, unspecified, not intractable, without status epilepticus: Secondary | ICD-10-CM | POA: Insufficient documentation

## 2014-07-27 DIAGNOSIS — R569 Unspecified convulsions: Secondary | ICD-10-CM

## 2014-07-27 DIAGNOSIS — R4182 Altered mental status, unspecified: Secondary | ICD-10-CM | POA: Insufficient documentation

## 2014-07-27 DIAGNOSIS — Z8742 Personal history of other diseases of the female genital tract: Secondary | ICD-10-CM | POA: Insufficient documentation

## 2014-07-27 DIAGNOSIS — Z79899 Other long term (current) drug therapy: Secondary | ICD-10-CM | POA: Insufficient documentation

## 2014-07-27 DIAGNOSIS — D649 Anemia, unspecified: Secondary | ICD-10-CM | POA: Insufficient documentation

## 2014-07-27 LAB — CBC WITH DIFFERENTIAL/PLATELET
Basophils Absolute: 0 10*3/uL (ref 0.0–0.1)
Basophils Relative: 1 % (ref 0–1)
EOS ABS: 0 10*3/uL (ref 0.0–0.7)
Eosinophils Relative: 1 % (ref 0–5)
HCT: 33.1 % — ABNORMAL LOW (ref 36.0–46.0)
Hemoglobin: 10.9 g/dL — ABNORMAL LOW (ref 12.0–15.0)
LYMPHS ABS: 0.9 10*3/uL (ref 0.7–4.0)
Lymphocytes Relative: 48 % — ABNORMAL HIGH (ref 12–46)
MCH: 31.1 pg (ref 26.0–34.0)
MCHC: 32.9 g/dL (ref 30.0–36.0)
MCV: 94.3 fL (ref 78.0–100.0)
Monocytes Absolute: 0.1 10*3/uL (ref 0.1–1.0)
Monocytes Relative: 7 % (ref 3–12)
Neutro Abs: 0.7 10*3/uL — ABNORMAL LOW (ref 1.7–7.7)
Neutrophils Relative %: 43 % (ref 43–77)
PLATELETS: 81 10*3/uL — AB (ref 150–400)
RBC: 3.51 MIL/uL — ABNORMAL LOW (ref 3.87–5.11)
RDW: 15.1 % (ref 11.5–15.5)
WBC: 1.7 10*3/uL — ABNORMAL LOW (ref 4.0–10.5)

## 2014-07-27 LAB — URINALYSIS, ROUTINE W REFLEX MICROSCOPIC
Bilirubin Urine: NEGATIVE
GLUCOSE, UA: NEGATIVE mg/dL
Hgb urine dipstick: NEGATIVE
KETONES UR: NEGATIVE mg/dL
Leukocytes, UA: NEGATIVE
NITRITE: NEGATIVE
Protein, ur: NEGATIVE mg/dL
Specific Gravity, Urine: 1.004 — ABNORMAL LOW (ref 1.005–1.030)
Urobilinogen, UA: 0.2 mg/dL (ref 0.0–1.0)
pH: 6.5 (ref 5.0–8.0)

## 2014-07-27 LAB — COMPREHENSIVE METABOLIC PANEL
ALT: 20 U/L (ref 0–35)
AST: 42 U/L — ABNORMAL HIGH (ref 0–37)
Albumin: 4.2 g/dL (ref 3.5–5.2)
Alkaline Phosphatase: 84 U/L (ref 39–117)
Anion gap: 9 (ref 5–15)
BILIRUBIN TOTAL: 0.9 mg/dL (ref 0.3–1.2)
BUN: 8 mg/dL (ref 6–23)
CALCIUM: 7.8 mg/dL — AB (ref 8.4–10.5)
CHLORIDE: 107 meq/L (ref 96–112)
CO2: 25 mmol/L (ref 19–32)
Creatinine, Ser: 0.49 mg/dL — ABNORMAL LOW (ref 0.50–1.10)
Glucose, Bld: 114 mg/dL — ABNORMAL HIGH (ref 70–99)
POTASSIUM: 3.2 mmol/L — AB (ref 3.5–5.1)
Sodium: 141 mmol/L (ref 135–145)
TOTAL PROTEIN: 6.8 g/dL (ref 6.0–8.3)

## 2014-07-27 LAB — RAPID URINE DRUG SCREEN, HOSP PERFORMED
Amphetamines: NOT DETECTED
Barbiturates: NOT DETECTED
Benzodiazepines: POSITIVE — AB
COCAINE: NOT DETECTED
Opiates: NOT DETECTED
TETRAHYDROCANNABINOL: NOT DETECTED

## 2014-07-27 LAB — ETHANOL: Alcohol, Ethyl (B): 428 mg/dL (ref 0–9)

## 2014-07-27 LAB — LIPASE, BLOOD: Lipase: 27 U/L (ref 11–59)

## 2014-07-27 MED ORDER — NALOXONE HCL 0.4 MG/ML IJ SOLN
0.4000 mg | Freq: Once | INTRAMUSCULAR | Status: AC
  Administered 2014-07-27: 0.4 mg via INTRAVENOUS

## 2014-07-27 MED ORDER — NALOXONE HCL 0.4 MG/ML IJ SOLN
INTRAMUSCULAR | Status: AC
  Administered 2014-07-27: 0.4 mg via INTRAVENOUS
  Filled 2014-07-27: qty 1

## 2014-07-27 NOTE — Discharge Instructions (Signed)

## 2014-07-27 NOTE — ED Provider Notes (Signed)
CSN: [id number]     Arrival date & time 07/27/14  1210 History   First MD Initiated Contact with Patient 07/27/14 1223     Chief Complaint  Patient presents with  . Seizures  . Altered Mental Status     (Consider location/radiation/quality/duration/timing/severity/associated sxs/prior Treatment) Patient is a 39 y.o. female presenting with altered mental status.  Altered Mental Status Presenting symptoms: unresponsiveness   Severity:  Severe Most recent episode:  Today Episode history:  Continuous Timing:  Constant Progression:  Unchanged Chronicity:  Recurrent Context: alcohol use   Context comment:  ? seizure Associated symptoms: no abdominal pain, no bladder incontinence and no vomiting     Past Medical History  Diagnosis Date  . Proctitis   . Cysts of both ovaries   . Anemia   . Anxiety   . Blood transfusion without reported diagnosis   . Depression   . Fatty liver 10/05/13  . Cardiac arrest   . Seizures    Past Surgical History  Procedure Laterality Date  . Ovarian cyst removal    . Laparoscopy N/A 09/28/2013    Procedure: LAPAROSCOPY OPERATIVE;  Surgeon: Terrance Mass, MD;  Location: [facility];  Service: Gynecology;  Laterality: N/A;  . Laparoscopic appendectomy Right 09/28/2013    Procedure: APPENDECTOMY LAPAROSCOPIC;  Surgeon: Terrance Mass, MD;  Location: [facility];  Service: Gynecology;  Laterality: Right;  . Salpingoophorectomy Right 09/28/2013    Procedure: SALPINGO OOPHORECTOMY;  Surgeon: Terrance Mass, MD;  Location: [facility];  Service: Gynecology;  Laterality: Right;  . Colonoscopy N/A 09/30/2013    Procedure: COLONOSCOPY;  Surgeon: Lafayette Dragon, MD;  Location: [facility];  Service: Endoscopy;  Laterality: N/A;  . Esophagogastroduodenoscopy N/A 11/23/2013    Procedure: ESOPHAGOGASTRODUODENOSCOPY (EGD);  Surgeon: Jerene Bears, MD;  Location: [facility];  Service: Endoscopy;  Laterality: N/A;  . Appendectomy    . Left and right heart catheterization with  coronary angiogram N/A 02/23/2014    Procedure: LEFT AND RIGHT HEART CATHETERIZATION WITH CORONARY ANGIOGRAM;  Surgeon: Leonie Man, MD;  Location: [facility] CATH [facility];  Service: Cardiovascular;  Laterality: N/A;   Family History  Problem Relation Age of Onset  . Diabetes Mother   . Hyperlipidemia Mother   . Stroke Mother   . Diabetes Father    History  Substance Use Topics  . Smoking status: Never Smoker   . Smokeless tobacco: Never Used  . Alcohol Use: Yes   OB History    Gravida Para Term Preterm AB TAB SAB Ectopic Multiple Living   7 3   4  4   3      Review of Systems  Unable to perform ROS: Patient unresponsive  Gastrointestinal: Negative for vomiting and abdominal pain.  Genitourinary: Negative for bladder incontinence.      Allergies  Morphine and related; Tramadol; and Penicillins  Home Medications   Prior to Admission medications   Medication Sig Start Date End Date Taking? Authorizing Provider  carvedilol (COREG) 3.125 MG tablet Take 0.5 tablets (1.5625 mg total) by mouth 2 (two) times daily with a meal. For high blood pressure 07/04/14  Yes Encarnacion Slates, NP  citalopram (CELEXA) 20 MG tablet Take 1 tablet (20 mg total) by mouth daily. For depression 07/04/14  Yes Encarnacion Slates, NP  disulfiram (ANTABUSE) 250 MG tablet Take 1 tablet (250 mg total) by mouth daily. For alcoholism 07/04/14  Yes Encarnacion Slates, NP  folic acid (FOLVITE) 1 MG tablet Take 1 tablet (  1 mg total) by mouth daily. For low folate 07/04/14  Yes Encarnacion Slates, NP  hydrOXYzine (ATARAX/VISTARIL) 25 MG tablet Take 1 tablet (25 mg total) by mouth every 6 (six) hours as needed for anxiety. For anxiety 07/04/14  Yes Encarnacion Slates, NP  levETIRAcetam (KEPPRA) 1000 MG tablet Take 1 tablet (1,000 mg total) by mouth daily. For seizure activities 07/04/14  Yes Encarnacion Slates, NP  mirtazapine (REMERON) 30 MG tablet Take 1 tablet (30 mg total) by mouth at bedtime. For depression/sleep Patient taking differently: Take  30 mg by mouth at bedtime as needed (for sleep/depression).  07/04/14  Yes Encarnacion Slates, NP  topiramate (TOPAMAX) 25 MG tablet Take 25 mg by mouth 2 (two) times daily.    Yes Historical Provider, MD  cephALEXin (KEFLEX) 500 MG capsule Take 1 capsule (500 mg total) by mouth every 8 (eight) hours. For infection Patient not taking: Reported on 07/27/2014 07/04/14   Encarnacion Slates, NP   BP 115/72 mmHg  Pulse 89  Temp(Src) 98 F (36.7 C) (Oral)  Resp 20  SpO2 97%  LMP  (LMP Unknown) Physical Exam  Constitutional: She appears well-developed and well-nourished.  HENT:  Head: Normocephalic and atraumatic.  Right Ear: External ear normal.  Left Ear: External ear normal.  Eyes: Conjunctivae and EOM are normal. Pupils are equal, round, and reactive to light.  Neck: Normal range of motion. Neck supple.  Cardiovascular: Normal rate, regular rhythm, normal heart sounds and intact distal pulses.   Pulmonary/Chest: Effort normal and breath sounds normal.  Abdominal: Soft. Bowel sounds are normal. There is no tenderness.  Musculoskeletal: Normal range of motion.  Neurological: She is unresponsive.  Pt moves all extremities intermittently, resists attempts to open eyes volitionally, non responsive to ammonia inhalant and sternal rub  Skin: Skin is warm and dry.  Vitals reviewed.   ED Course  Procedures (including critical care time) Labs Review Labs Reviewed  COMPREHENSIVE METABOLIC PANEL - Abnormal; Notable for the following:    Potassium 3.2 (*)    Glucose, Bld 114 (*)    Creatinine, Ser 0.49 (*)    Calcium 7.8 (*)    AST 42 (*)    All other components within normal limits  CBC WITH DIFFERENTIAL - Abnormal; Notable for the following:    WBC 1.7 (*)    RBC 3.51 (*)    Hemoglobin 10.9 (*)    HCT 33.1 (*)    Platelets 81 (*)    Lymphocytes Relative 48 (*)    Neutro Abs 0.7 (*)    All other components within normal limits  URINALYSIS, ROUTINE W REFLEX MICROSCOPIC - Abnormal; Notable for  the following:    Specific Gravity, Urine 1.004 (*)    All other components within normal limits  ETHANOL - Abnormal; Notable for the following:    Alcohol, Ethyl (B) 428 (*)    All other components within normal limits  URINE RAPID DRUG SCREEN (HOSP PERFORMED) - Abnormal; Notable for the following:    Benzodiazepines POSITIVE (*)    All other components within normal limits  LIPASE, BLOOD  PATHOLOGIST SMEAR REVIEW    Imaging Review Dg Chest Port 1 View  07/27/2014   CLINICAL DATA:  Chest pain and difficulty breathing  EXAM: PORTABLE CHEST - 1 VIEW  COMPARISON:  June 08, 2014  FINDINGS: There is no edema or consolidation. Heart size and pulmonary vascularity are normal. No adenopathy. A hair pin presumably overlies the lower left chest.  IMPRESSION:  No edema or consolidation. Hair pin is presumably overlying the left base. Lateral view could confirm if there is any concern for possible aspiration.   Electronically Signed   By: Lowella Grip M.D.   On: 07/27/2014 13:14     EKG Interpretation None      MDM   Final diagnoses:  Altered mental status  Seizure    39 y.o. female with pertinent PMH of seizures, prior cardiac arrest, frequent visits for seizure vs pseudoseizure presents with recurrent episode of unresponsiveness.  Pt was reportedly in her normal state of health yesterday, awoke, drank etoh of "2 glasses of wine", [place] when she became unresponsive. On EMS arrival the patient had a very brief episode of seizure-like activity was given Versed.  On arrival to the ED the patient has vital signs and physical exam as above.  My exam is more consistent with volitional activity than with true unresponsiveness or postictal period.  Patient given Narcan without response. Labs returned as above with elevated EtOH level. Patient awoke suddenly shortly after my initial exam. She had no confusion or drowsiness. She was able to ambulate unassisted. Workup unremarkable. Likely etiology  reaction to Versed versus volitional activity. Discharged home in stable condition with standard return precautions..    I have reviewed all laboratory and imaging studies if ordered as above  1. Seizure   2. Altered mental status         Debby Freiberg, MD 07/28/14 [phone]
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
## 2014-07-27 NOTE — ED Notes (Signed)
Bus tickets given, avs explained in detail. Neurologically intact. No other questions/concerns.

## 2014-07-30 LAB — PATHOLOGIST SMEAR REVIEW

## 2014-08-06 ENCOUNTER — Emergency Department (HOSPITAL_COMMUNITY)
Admission: EM | Admit: 2014-08-06 | Discharge: 2014-08-06 | Discharge status: Against Medical Advice | Payer: Self-pay | Attending: Emergency Medicine | Admitting: Emergency Medicine

## 2014-08-06 ENCOUNTER — Encounter (HOSPITAL_COMMUNITY): Payer: Self-pay | Admitting: Emergency Medicine

## 2014-08-06 DIAGNOSIS — R1084 Generalized abdominal pain: Secondary | ICD-10-CM | POA: Insufficient documentation

## 2014-08-06 DIAGNOSIS — Z880 Allergy status to penicillin: Secondary | ICD-10-CM | POA: Insufficient documentation

## 2014-08-06 DIAGNOSIS — Z8719 Personal history of other diseases of the digestive system: Secondary | ICD-10-CM | POA: Insufficient documentation

## 2014-08-06 DIAGNOSIS — G40909 Epilepsy, unspecified, not intractable, without status epilepticus: Secondary | ICD-10-CM | POA: Insufficient documentation

## 2014-08-06 DIAGNOSIS — Z8742 Personal history of other diseases of the female genital tract: Secondary | ICD-10-CM | POA: Insufficient documentation

## 2014-08-06 DIAGNOSIS — Z9889 Other specified postprocedural states: Secondary | ICD-10-CM | POA: Insufficient documentation

## 2014-08-06 DIAGNOSIS — F419 Anxiety disorder, unspecified: Secondary | ICD-10-CM | POA: Insufficient documentation

## 2014-08-06 DIAGNOSIS — R110 Nausea: Secondary | ICD-10-CM | POA: Insufficient documentation

## 2014-08-06 DIAGNOSIS — Z8674 Personal history of sudden cardiac arrest: Secondary | ICD-10-CM | POA: Insufficient documentation

## 2014-08-06 DIAGNOSIS — F329 Major depressive disorder, single episode, unspecified: Secondary | ICD-10-CM | POA: Insufficient documentation

## 2014-08-06 DIAGNOSIS — D649 Anemia, unspecified: Secondary | ICD-10-CM | POA: Insufficient documentation

## 2014-08-06 DIAGNOSIS — Z79899 Other long term (current) drug therapy: Secondary | ICD-10-CM | POA: Insufficient documentation

## 2014-08-06 DIAGNOSIS — R1030 Lower abdominal pain, unspecified: Secondary | ICD-10-CM

## 2014-08-06 DIAGNOSIS — R197 Diarrhea, unspecified: Secondary | ICD-10-CM | POA: Insufficient documentation

## 2014-08-06 DIAGNOSIS — Z9089 Acquired absence of other organs: Secondary | ICD-10-CM | POA: Insufficient documentation

## 2014-08-06 DIAGNOSIS — Z3202 Encounter for pregnancy test, result negative: Secondary | ICD-10-CM | POA: Insufficient documentation

## 2014-08-06 LAB — COMPREHENSIVE METABOLIC PANEL
ALBUMIN: 4.2 g/dL (ref 3.5–5.2)
ALK PHOS: 90 U/L (ref 39–117)
ALT: 28 U/L (ref 0–35)
AST: 97 U/L — ABNORMAL HIGH (ref 0–37)
Anion gap: 10 (ref 5–15)
BUN: 13 mg/dL (ref 6–23)
CALCIUM: 8.3 mg/dL — AB (ref 8.4–10.5)
CO2: 25 mmol/L (ref 19–32)
Chloride: 106 mEq/L (ref 96–112)
Creatinine, Ser: 0.42 mg/dL — ABNORMAL LOW (ref 0.50–1.10)
GFR calc Af Amer: >90 mL/min (ref >90)
GFR calc non Af Amer: >90 mL/min (ref >90)
Glucose, Bld: 98 mg/dL (ref 70–99)
Potassium: 4.4 mmol/L (ref 3.5–5.1)
SODIUM: 141 mmol/L (ref 135–145)
TOTAL PROTEIN: 7.1 g/dL (ref 6.0–8.3)
Total Bilirubin: 0.5 mg/dL (ref 0.3–1.2)

## 2014-08-06 LAB — CBC WITH DIFFERENTIAL/PLATELET
BASOS PCT: 2 % — AB (ref 0–1)
Basophils Absolute: 0 10*3/uL (ref 0.0–0.1)
EOS ABS: 0.1 10*3/uL (ref 0.0–0.7)
Eosinophils Relative: 3 % (ref 0–5)
HEMATOCRIT: 32.8 % — AB (ref 36.0–46.0)
Hemoglobin: 10.7 g/dL — ABNORMAL LOW (ref 12.0–15.0)
Lymphocytes Relative: 41 % (ref 12–46)
Lymphs Abs: 0.8 10*3/uL (ref 0.7–4.0)
MCH: 31.8 pg (ref 26.0–34.0)
MCHC: 32.6 g/dL (ref 30.0–36.0)
MCV: 97.3 fL (ref 78.0–100.0)
MONOS PCT: 9 % (ref 3–12)
Monocytes Absolute: 0.2 10*3/uL (ref 0.1–1.0)
NEUTROS ABS: 0.9 10*3/uL — AB (ref 1.7–7.7)
NEUTROS PCT: 45 % (ref 43–77)
Platelets: 105 10*3/uL — ABNORMAL LOW (ref 150–400)
RBC: 3.37 MIL/uL — AB (ref 3.87–5.11)
RDW: 17 % — ABNORMAL HIGH (ref 11.5–15.5)
WBC: 2 10*3/uL — ABNORMAL LOW (ref 4.0–10.5)

## 2014-08-06 LAB — URINALYSIS, ROUTINE W REFLEX MICROSCOPIC
BILIRUBIN URINE: NEGATIVE
GLUCOSE, UA: NEGATIVE mg/dL
Hgb urine dipstick: NEGATIVE
Ketones, ur: NEGATIVE mg/dL
Leukocytes, UA: NEGATIVE
Nitrite: NEGATIVE
Protein, ur: NEGATIVE mg/dL
Specific Gravity, Urine: 1.013 (ref 1.005–1.030)
UROBILINOGEN UA: 0.2 mg/dL (ref 0.0–1.0)
pH: 5.5 (ref 5.0–8.0)

## 2014-08-06 LAB — LIPASE, BLOOD: LIPASE: 33 U/L (ref 11–59)

## 2014-08-06 LAB — PREGNANCY, URINE: Preg Test, Ur: NEGATIVE

## 2014-08-06 MED ORDER — SODIUM CHLORIDE 0.9 % IV BOLUS (SEPSIS)
1000.0000 mL | Freq: Once | INTRAVENOUS | Status: AC
  Administered 2014-08-06: 1000 mL via INTRAVENOUS

## 2014-08-06 MED ORDER — ACETAMINOPHEN 500 MG PO TABS
500.0000 mg | ORAL_TABLET | Freq: Once | ORAL | Status: DC
  Filled 2014-08-06: qty 1
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
## 2014-08-06 NOTE — ED Provider Notes (Signed)
Face-to-face evaluation   History: She presents for evaluation of pain after a fall.  She states that she is falling because of multiple seizures.  She has had 8 seizures by her report in the last week.  The seizures usually make it hard to talk and she loses control of her right side.    Physical exam: Alert, cooperative.  She appears depressed.  She has a normal gait.  Head was without areas of swelling or bleeding at this time.  Medical screening examination/treatment/procedure(s) were conducted as a shared visit with non-physician practitioner(s) and myself.  I personally evaluated the patient during the encounter  Richarda Blade, MD 08/07/14 [phone]

## 2014-08-06 NOTE — ED Notes (Signed)
Bed: JT70 Expected date:  Expected time:  Means of arrival:  Comments: Ems- 39 yo F, seizure and miscarriage

## 2014-08-06 NOTE — ED Provider Notes (Signed)
CSN: [id number]     Arrival date & time 08/06/14  1437 History   First MD Initiated Contact with Patient 08/06/14 1523     Chief Complaint  Patient presents with  . Abdominal Pain    Kristen Roberson is a 39 y.o. female with a history of seizures, prior cardiac arrest, appendectomy, right oophorectomy, and frequent visits for seizures versus pseudoseizures who presents to the ED complaining of low abdominal pain for the past 3 days. According to nursing note EMS was called to her home for a seizure and patient was speaking in full sentences to EMS during shaking episode. Patient reports she has approximately 3 seizures a day. Patient reports she is taking Keppra as prescribed. Patient reports her pain is across her lower abdomen and she rates it at a 9 out of 10. Patient describes it as a cramp. Patient has difficulty coming up with alleviating or aggravating factors. Patient reports some blood in her urine and urinary urgency. Patient reports nausea without vomiting. Patient also reports loose stool today. Patient reports her menstrual cycles are irregular and her last one started on 08/04/2014. Patient reports bright red blood in her stools and when wiping for "many weeks." Patient reports using 3 pads today. Patient denies fevers, dysuria, vomiting, vaginal discharge, hematemesis, rashes or sick contacts.  (Consider location/radiation/quality/duration/timing/severity/associated sxs/prior Treatment) HPI  Past Medical History  Diagnosis Date  . Proctitis   . Cysts of both ovaries   . Anemia   . Anxiety   . Blood transfusion without reported diagnosis   . Depression   . Fatty liver 10/05/13  . Cardiac arrest   . Seizures    Past Surgical History  Procedure Laterality Date  . Ovarian cyst removal    . Laparoscopy N/A 09/28/2013    Procedure: LAPAROSCOPY OPERATIVE;  Surgeon: Terrance Mass, MD;  Location: [facility];  Service: Gynecology;  Laterality: N/A;  . Laparoscopic appendectomy Right  09/28/2013    Procedure: APPENDECTOMY LAPAROSCOPIC;  Surgeon: Terrance Mass, MD;  Location: [facility];  Service: Gynecology;  Laterality: Right;  . Salpingoophorectomy Right 09/28/2013    Procedure: SALPINGO OOPHORECTOMY;  Surgeon: Terrance Mass, MD;  Location: [facility];  Service: Gynecology;  Laterality: Right;  . Colonoscopy N/A 09/30/2013    Procedure: COLONOSCOPY;  Surgeon: Lafayette Dragon, MD;  Location: [facility];  Service: Endoscopy;  Laterality: N/A;  . Esophagogastroduodenoscopy N/A 11/23/2013    Procedure: ESOPHAGOGASTRODUODENOSCOPY (EGD);  Surgeon: Jerene Bears, MD;  Location: [facility];  Service: Endoscopy;  Laterality: N/A;  . Appendectomy    . Left and right heart catheterization with coronary angiogram N/A 02/23/2014    Procedure: LEFT AND RIGHT HEART CATHETERIZATION WITH CORONARY ANGIOGRAM;  Surgeon: Leonie Man, MD;  Location: [facility] CATH LAB;  Service: Cardiovascular;  Laterality: N/A;   Family History  Problem Relation Age of Onset  . Diabetes Mother   . Hyperlipidemia Mother   . Stroke Mother   . Diabetes Father    History  Substance Use Topics  . Smoking status: Never Smoker   . Smokeless tobacco: Never Used  . Alcohol Use: Yes   OB History    Gravida Para Term Preterm AB TAB SAB Ectopic Multiple Living   7 3   4  4   3      Review of Systems  Constitutional: Negative for fever.  HENT: Negative for congestion and sore throat.   Eyes: Negative for visual disturbance.  Respiratory: Negative for cough, shortness of  breath and wheezing.   Cardiovascular: Negative for chest pain and palpitations.  Gastrointestinal: Positive for nausea, abdominal pain, diarrhea and blood in stool. Negative for vomiting.  Genitourinary: Positive for urgency and hematuria. Negative for dysuria, frequency, flank pain, vaginal discharge and difficulty urinating.  Musculoskeletal: Negative for back pain and neck pain.  Skin: Negative for rash.  Neurological: Negative for headaches.       Allergies  Morphine and related; Tramadol; and Penicillins  Home Medications   Prior to Admission medications   Medication Sig Start Date End Date Taking? Authorizing Provider  carvedilol (COREG) 3.125 MG tablet Take 0.5 tablets (1.5625 mg total) by mouth 2 (two) times daily with a meal. For high blood pressure 07/04/14  Yes Encarnacion Slates, NP  citalopram (CELEXA) 20 MG tablet Take 1 tablet (20 mg total) by mouth daily. For depression 07/04/14  Yes Encarnacion Slates, NP  disulfiram (ANTABUSE) 250 MG tablet Take 1 tablet (250 mg total) by mouth daily. For alcoholism 07/04/14  Yes Encarnacion Slates, NP  folic acid (FOLVITE) 1 MG tablet Take 1 tablet (1 mg total) by mouth daily. For low folate 07/04/14  Yes Encarnacion Slates, NP  hydrOXYzine (ATARAX/VISTARIL) 25 MG tablet Take 1 tablet (25 mg total) by mouth every 6 (six) hours as needed for anxiety. For anxiety 07/04/14  Yes Encarnacion Slates, NP  levETIRAcetam (KEPPRA) 1000 MG tablet Take 1 tablet (1,000 mg total) by mouth daily. For seizure activities 07/04/14  Yes Encarnacion Slates, NP  mirtazapine (REMERON) 30 MG tablet Take 1 tablet (30 mg total) by mouth at bedtime. For depression/sleep Patient taking differently: Take 30 mg by mouth at bedtime as needed (for sleep/depression).  07/04/14  Yes Encarnacion Slates, NP  topiramate (TOPAMAX) 25 MG tablet Take 25 mg by mouth 2 (two) times daily.    Yes Historical Provider, MD  cephALEXin (KEFLEX) 500 MG capsule Take 1 capsule (500 mg total) by mouth every 8 (eight) hours. For infection Patient not taking: Reported on 07/27/2014 07/04/14   Encarnacion Slates, NP   BP 119/79 mmHg  Pulse 96  Temp(Src) 97.7 F (36.5 C) (Oral)  Resp 20  SpO2 98%  LMP  (LMP Unknown) Physical Exam  Constitutional: She is oriented to person, place, and time. She appears well-developed and well-nourished. No distress.  HENT:  Head: Normocephalic and atraumatic.  Mouth/Throat: Oropharynx is clear and moist.  Eyes: Conjunctivae are  normal. Pupils are equal, round, and reactive to light. Right eye exhibits no discharge. Left eye exhibits no discharge.  Neck: Neck supple.  Cardiovascular: Normal rate, regular rhythm, normal heart sounds and intact distal pulses.  Exam reveals no gallop and no friction rub.   No murmur heard. Pulmonary/Chest: Effort normal and breath sounds normal. No respiratory distress. She has no wheezes. She has no rales.  Abdominal: Soft. She exhibits no distension and no mass. There is tenderness. There is guarding.  Patient's abdomen is soft. Patient's abdomen is generally tender to palpation. Negative psoas and obturator sign.  Musculoskeletal: She exhibits no edema.  Patient is spontaneously moving all extremities in a coordinated fashion exhibiting good strength.   Lymphadenopathy:    She has no cervical adenopathy.  Neurological: She is alert and oriented to person, place, and time. Coordination normal.  Patient is alert and oriented 3.   Skin: Skin is warm and dry. No rash noted. She is not diaphoretic. No erythema. No pallor.  Psychiatric: She has a normal mood and affect.  Her behavior is normal.  Nursing note and vitals reviewed.   ED Course  Procedures (including critical care time) Labs Review Labs Reviewed  PREGNANCY, URINE  URINALYSIS, ROUTINE W REFLEX MICROSCOPIC  COMPREHENSIVE METABOLIC PANEL  LIPASE, BLOOD  CBC WITH DIFFERENTIAL    Imaging Review No results found.   EKG Interpretation None      Filed Vitals:   08/06/14 1440 08/06/14 1443  BP:  119/79  Pulse:  96  Temp:  97.7 F (36.5 C)  TempSrc:  Oral  Resp:  20  SpO2: 97% 98%     MDM   Meds given in ED:  Medications  acetaminophen (TYLENOL) tablet 500 mg (500 mg Oral Not Given 08/06/14 1609)  sodium chloride 0.9 % bolus 1,000 mL (0 mLs Intravenous Stopped 08/06/14 1717)    Discharge Medication List as of 08/06/2014  5:19 PM      Final diagnoses:  Lower abdominal pain    Kristen Roberson is a 38  y.o. female with a history of seizures, prior cardiac arrest, appendectomy, right oophorectomy, and frequent visits for seizures versus pseudoseizures who presents to the ED complaining of low abdominal pain for the past 3 days.  Patient is afebrile and nontoxic-appearing. Patient's abdomen is generally tender to palpation. Patient given Tylenol and fluid bolus in ED. Labwork ordered on this patient however patient left AMA prior to being able to review lab work. I was unable to speak with the patient prior to her discharge.       Hanley Hays, PA-C 08/06/14 1817  Richarda Blade, MD 08/07/14 [phone]

## 2014-08-13 ENCOUNTER — Encounter (HOSPITAL_COMMUNITY): Payer: Self-pay

## 2014-08-13 ENCOUNTER — Emergency Department (HOSPITAL_COMMUNITY)
Admission: EM | Admit: 2014-08-13 | Discharge: 2014-08-14 | Disposition: A | Discharge status: Home / Self Care | Payer: Self-pay | Attending: Emergency Medicine | Admitting: Emergency Medicine

## 2014-08-13 DIAGNOSIS — D649 Anemia, unspecified: Secondary | ICD-10-CM | POA: Insufficient documentation

## 2014-08-13 DIAGNOSIS — Z79899 Other long term (current) drug therapy: Secondary | ICD-10-CM | POA: Insufficient documentation

## 2014-08-13 DIAGNOSIS — R200 Anesthesia of skin: Secondary | ICD-10-CM | POA: Insufficient documentation

## 2014-08-13 DIAGNOSIS — Z9889 Other specified postprocedural states: Secondary | ICD-10-CM | POA: Insufficient documentation

## 2014-08-13 DIAGNOSIS — Y9289 Other specified places as the place of occurrence of the external cause: Secondary | ICD-10-CM | POA: Insufficient documentation

## 2014-08-13 DIAGNOSIS — Y998 Other external cause status: Secondary | ICD-10-CM | POA: Insufficient documentation

## 2014-08-13 DIAGNOSIS — Y9389 Activity, other specified: Secondary | ICD-10-CM | POA: Insufficient documentation

## 2014-08-13 DIAGNOSIS — F1020 Alcohol dependence, uncomplicated: Secondary | ICD-10-CM

## 2014-08-13 DIAGNOSIS — W01198A Fall on same level from slipping, tripping and stumbling with subsequent striking against other object, initial encounter: Secondary | ICD-10-CM | POA: Insufficient documentation

## 2014-08-13 DIAGNOSIS — Z8674 Personal history of sudden cardiac arrest: Secondary | ICD-10-CM | POA: Insufficient documentation

## 2014-08-13 DIAGNOSIS — F419 Anxiety disorder, unspecified: Secondary | ICD-10-CM | POA: Insufficient documentation

## 2014-08-13 DIAGNOSIS — Z880 Allergy status to penicillin: Secondary | ICD-10-CM | POA: Insufficient documentation

## 2014-08-13 DIAGNOSIS — Z8742 Personal history of other diseases of the female genital tract: Secondary | ICD-10-CM | POA: Insufficient documentation

## 2014-08-13 DIAGNOSIS — S79911A Unspecified injury of right hip, initial encounter: Secondary | ICD-10-CM | POA: Insufficient documentation

## 2014-08-13 DIAGNOSIS — F329 Major depressive disorder, single episode, unspecified: Secondary | ICD-10-CM | POA: Insufficient documentation

## 2014-08-13 DIAGNOSIS — Z8719 Personal history of other diseases of the digestive system: Secondary | ICD-10-CM | POA: Insufficient documentation

## 2014-08-13 DIAGNOSIS — G40909 Epilepsy, unspecified, not intractable, without status epilepticus: Secondary | ICD-10-CM

## 2014-08-13 NOTE — ED Notes (Signed)
Pt completely alert and oriented, speaking clear complete sentences.

## 2014-08-13 NOTE — ED Notes (Addendum)
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 Pt still very combative upon EMS arrival and wont allow anyone to touch her. Friend states pt was "in and out of it for about 15 minutes."

## 2014-08-14 ENCOUNTER — Emergency Department (HOSPITAL_COMMUNITY): Payer: Self-pay

## 2014-08-14 ENCOUNTER — Encounter (HOSPITAL_COMMUNITY): Payer: Self-pay

## 2014-08-14 LAB — ETHANOL: Alcohol, Ethyl (B): 419 mg/dL (ref 0–9)

## 2014-08-14 LAB — I-STAT CHEM 8, ED
BUN: 11 mg/dL (ref 6–23)
Calcium, Ion: 1.07 mmol/L — ABNORMAL LOW (ref 1.12–1.23)
Chloride: 105 mmol/L (ref 96–112)
Creatinine, Ser: 1.3 mg/dL — ABNORMAL HIGH (ref 0.50–1.10)
GLUCOSE: 112 mg/dL — AB (ref 70–99)
HCT: 34 % — ABNORMAL LOW (ref 36.0–46.0)
Hemoglobin: 11.6 g/dL — ABNORMAL LOW (ref 12.0–15.0)
Potassium: 3.8 mmol/L (ref 3.5–5.1)
Sodium: 140 mmol/L (ref 135–145)
TCO2: 21 mmol/L (ref 0–100)

## 2014-08-14 LAB — CBC WITH DIFFERENTIAL/PLATELET
BASOS ABS: 0.1 10*3/uL (ref 0.0–0.1)
Basophils Relative: 4 % — ABNORMAL HIGH (ref 0–1)
EOS ABS: 0 10*3/uL (ref 0.0–0.7)
Eosinophils Relative: 1 % (ref 0–5)
HCT: 31.1 % — ABNORMAL LOW (ref 36.0–46.0)
Hemoglobin: 10.3 g/dL — ABNORMAL LOW (ref 12.0–15.0)
LYMPHS ABS: 1.2 10*3/uL (ref 0.7–4.0)
Lymphocytes Relative: 48 % — ABNORMAL HIGH (ref 12–46)
MCH: 32.4 pg (ref 26.0–34.0)
MCHC: 33.1 g/dL (ref 30.0–36.0)
MCV: 97.8 fL (ref 78.0–100.0)
MONOS PCT: 11 % (ref 3–12)
Monocytes Absolute: 0.3 10*3/uL (ref 0.1–1.0)
NEUTROS PCT: 36 % — AB (ref 43–77)
Neutro Abs: 0.9 10*3/uL — ABNORMAL LOW (ref 1.7–7.7)
Platelets: 157 10*3/uL (ref 150–400)
RBC: 3.18 MIL/uL — AB (ref 3.87–5.11)
RDW: 16.8 % — AB (ref 11.5–15.5)
WBC: 2.5 10*3/uL — ABNORMAL LOW (ref 4.0–10.5)

## 2014-08-14 MED ORDER — LORAZEPAM 1 MG PO TABS
1.0000 mg | ORAL_TABLET | Freq: Once | ORAL | Status: AC
  Administered 2014-08-14: 1 mg via ORAL
  Filled 2014-08-14: qty 1

## 2014-08-14 MED ORDER — KETOROLAC TROMETHAMINE 30 MG/ML IJ SOLN
30.0000 mg | Freq: Once | INTRAMUSCULAR | Status: AC
  Administered 2014-08-14: 30 mg via INTRAMUSCULAR
  Filled 2014-08-14: qty 1

## 2014-08-14 NOTE — Discharge Instructions (Signed)
Finding Treatment for Alcohol and Drug Addiction It can be hard to find the right place to get professional treatment. Here are some important things to consider:  There are different types of treatment to choose from.  Some programs are live-in (residential) while others are not (outpatient). Sometimes a combination is offered.  No single type of program is right for everyone.  Most treatment programs involve a combination of education, counseling, and a 12-step, spiritually-based approach.  There are non-spiritually based programs (not 12-step).  Some treatment programs are government sponsored. They are geared for patients without private insurance.  Treatment programs can vary in many respects such as:  Cost and types of insurance accepted.  Types of on-site medical services offered.  Length of stay, setting, and size.  Overall philosophy of treatment. A person may need specialized treatment or have needs not addressed by all programs. For example, adolescents need treatment appropriate for their age. Other people have secondary disorders that must be managed as well. Secondary conditions can include mental illness, such as depression or diabetes. Often, a period of detoxification from alcohol or drugs is needed. This requires medical supervision and not all programs offer this. THINGS TO CONSIDER WHEN SELECTING A TREATMENT PROGRAM   Is the program certified by the appropriate government agency? Even private programs must be certified and employ certified professionals.  Does the program accept your insurance? If not, can a payment plan be set up?  Is the facility clean, organized, and well run? Do they allow you to speak with graduates who can share their treatment experience with you? Can you tour the facility? Can you meet with staff?  Does the program meet the full range of individual needs?  Does the treatment program address sexual orientation and physical disabilities?  Do they provide age, gender, and culturally appropriate treatment services?  Is treatment available in languages other than English?  Is long-term aftercare support or guidance encouraged and provided?  Is assessment of an individual's treatment plan ongoing to ensure it meets changing needs?  Does the program use strategies to encourage reluctant patients to remain in treatment long enough to increase the likelihood of success?  Does the program offer counseling (individual or group) and other behavioral therapies?  Does the program offer medicine as part of the treatment regimen, if needed?  Is there ongoing monitoring of possible relapse? Is there a defined relapse prevention program? Are services or referrals offered to family members to ensure they understand addiction and the recovery process? This would help them support the recovering individual.  Are 12-step meetings held at the center or is transport available for patients to attend outside meetings? In countries outside of the [place] and [place], see local directories for contact information for services in your area. Document Released: 06/04/2005 Document Revised: 09/28/2011 Document Reviewed: 12/15/2007 ExitCare Patient Information 2015 [employer]. This information is not intended to replace advice given to you by your health care provider. Make sure you discuss any questions you have with your health care provider.  

## 2014-08-14 NOTE — ED Provider Notes (Signed)
CSN: [id number]     Arrival date & time 08/13/14  2326 History   None    Chief Complaint  Patient presents with  . Seizures     (Consider location/radiation/quality/duration/timing/severity/associated sxs/prior Treatment) HPI Comments: This is a 39 year old female with a history of seizures/pseudoseizure who presents with reported 3.  Episodic clustered seizures with post ictal phase,  Per her fianc.  Her fianc reports that she fell to her knees .  He saw this happening .  He immediately was at her side preventing her head from hitting the ground .  She now is complaining of right hip pain without deformity or external/ internal rotation She normally has a warning aura, right sided pain, facial drooping and word confusion.  This can happen up to an hour prior to "seizure". Patient states she's been compliant with her Keppra.  She's not had anything alcoholic to drink, although she does have a history of alcohol abuse, last seeking detox for her alcohol problem on 07/27/2014.  Patient is a 39 y.o. female presenting with seizures. The history is provided by the patient and a friend.  Seizures Seizure activity on arrival: no   Seizure type:  Grand mal Preceding symptoms: numbness   Initial focality:  Diffuse Episode characteristics: abnormal movements and generalized shaking   Postictal symptoms: confusion   Return to baseline: yes   Severity:  Moderate Timing:  Clustered Number of seizures this episode:  3 Progression:  Resolved Context: not change in medication, not sleeping less, not developmental delay, not drug use and not emotional upset   Recent head injury:  During the event PTA treatment:  None   Past Medical History  Diagnosis Date  . Proctitis   . Cysts of both ovaries   . Anemia   . Anxiety   . Blood transfusion without reported diagnosis   . Depression   . Fatty liver 10/05/13  . Cardiac arrest   . Seizures    Past Surgical History  Procedure Laterality Date  .  Ovarian cyst removal    . Laparoscopy N/A 09/28/2013    Procedure: LAPAROSCOPY OPERATIVE;  Surgeon: Terrance Mass, MD;  Location: [facility];  Service: Gynecology;  Laterality: N/A;  . Laparoscopic appendectomy Right 09/28/2013    Procedure: APPENDECTOMY LAPAROSCOPIC;  Surgeon: Terrance Mass, MD;  Location: [facility];  Service: Gynecology;  Laterality: Right;  . Salpingoophorectomy Right 09/28/2013    Procedure: SALPINGO OOPHORECTOMY;  Surgeon: Terrance Mass, MD;  Location: [facility];  Service: Gynecology;  Laterality: Right;  . Colonoscopy N/A 09/30/2013    Procedure: COLONOSCOPY;  Surgeon: Lafayette Dragon, MD;  Location: [facility];  Service: Endoscopy;  Laterality: N/A;  . Esophagogastroduodenoscopy N/A 11/23/2013    Procedure: ESOPHAGOGASTRODUODENOSCOPY (EGD);  Surgeon: Jerene Bears, MD;  Location: [facility];  Service: Endoscopy;  Laterality: N/A;  . Appendectomy    . Left and right heart catheterization with coronary angiogram N/A 02/23/2014    Procedure: LEFT AND RIGHT HEART CATHETERIZATION WITH CORONARY ANGIOGRAM;  Surgeon: Leonie Man, MD;  Location: [facility];  Service: Cardiovascular;  Laterality: N/A;   Family History  Problem Relation Age of Onset  . Diabetes Mother   . Hyperlipidemia Mother   . Stroke Mother   . Diabetes Father    History  Substance Use Topics  . Smoking status: Never Smoker   . Smokeless tobacco: Never Used  . Alcohol Use: Yes   OB History    Gravida Para Term Preterm AB  TAB SAB Ectopic Multiple Living   7 3   4  4   3      Review of Systems  Constitutional: Negative for fever.  Respiratory: Negative for chest tightness and shortness of breath.   Cardiovascular: Negative for chest pain and leg swelling.  Gastrointestinal: Negative for nausea.  Genitourinary: Negative for dysuria.  Musculoskeletal: Positive for arthralgias.  Skin: Negative for wound.  Neurological: Positive for seizures and numbness. Negative for dizziness, speech difficulty,  light-headedness and headaches.  All other systems reviewed and are negative.     Allergies  Morphine and related; Tramadol; and Penicillins  Home Medications   Prior to Admission medications   Medication Sig Start Date End Date Taking? Authorizing Provider  carvedilol (COREG) 3.125 MG tablet Take 0.5 tablets (1.5625 mg total) by mouth 2 (two) times daily with a meal. For high blood pressure 07/04/14  Yes Encarnacion Slates, NP  disulfiram (ANTABUSE) 250 MG tablet Take 1 tablet (250 mg total) by mouth daily. For alcoholism 07/04/14  Yes Encarnacion Slates, NP  folic acid (FOLVITE) 1 MG tablet Take 1 tablet (1 mg total) by mouth daily. For low folate 07/04/14  Yes Encarnacion Slates, NP  hydrOXYzine (ATARAX/VISTARIL) 25 MG tablet Take 1 tablet (25 mg total) by mouth every 6 (six) hours as needed for anxiety. For anxiety 07/04/14  Yes Encarnacion Slates, NP  levETIRAcetam (KEPPRA) 1000 MG tablet Take 1 tablet (1,000 mg total) by mouth daily. For seizure activities 07/04/14  Yes Encarnacion Slates, NP  mirtazapine (REMERON) 30 MG tablet Take 1 tablet (30 mg total) by mouth at bedtime. For depression/sleep Patient taking differently: Take 30 mg by mouth at bedtime as needed (for sleep/depression).  07/04/14  Yes Encarnacion Slates, NP  cephALEXin (KEFLEX) 500 MG capsule Take 1 capsule (500 mg total) by mouth every 8 (eight) hours. For infection Patient not taking: Reported on 07/27/2014 07/04/14   Encarnacion Slates, NP  citalopram (CELEXA) 20 MG tablet Take 1 tablet (20 mg total) by mouth daily. For depression Patient not taking: Reported on 08/14/2014 07/04/14   Encarnacion Slates, NP  topiramate (TOPAMAX) 25 MG tablet Take 25 mg by mouth 2 (two) times daily.     Historical Provider, MD   BP 134/76 mmHg  Pulse 128  Resp 16  SpO2 98%  LMP  (LMP Unknown) Physical Exam  Constitutional: She is oriented to person, place, and time. She appears well-developed and well-nourished.  HENT:  Head: Normocephalic.  Eyes: Pupils are equal,  round, and reactive to light.  Neck: Normal range of motion.  Cardiovascular: Normal rate and regular rhythm.   Pulmonary/Chest: Effort normal and breath sounds normal.  Abdominal: Soft. She exhibits no distension.  Musculoskeletal: She exhibits tenderness. She exhibits no edema.       Right hip: She exhibits tenderness. She exhibits normal range of motion, normal strength, no bony tenderness, no swelling, no crepitus, no deformity and no laceration.       Legs: Neurological: She is alert and oriented to person, place, and time. She displays no tremor. No cranial nerve deficit or sensory deficit. She exhibits normal muscle tone.  Weak grasp R   Skin: Skin is warm and dry.  Nursing note and vitals reviewed.   ED Course  Procedures (including critical care time) Labs Review Labs Reviewed  CBC WITH DIFFERENTIAL/PLATELET - Abnormal; Notable for the following:    WBC 2.5 (*)    RBC 3.18 (*)  Hemoglobin 10.3 (*)    HCT 31.1 (*)    RDW 16.8 (*)    Neutrophils Relative % 36 (*)    Lymphocytes Relative 48 (*)    Basophils Relative 4 (*)    Neutro Abs 0.9 (*)    All other components within normal limits  ETHANOL - Abnormal; Notable for the following:    Alcohol, Ethyl (B) 419 (*)    All other components within normal limits  I-STAT CHEM 8, ED - Abnormal; Notable for the following:    Creatinine, Ser 1.30 (*)    Glucose, Bld 112 (*)    Calcium, Ion 1.07 (*)    Hemoglobin 11.6 (*)    HCT 34.0 (*)    All other components within normal limits  URINE RAPID DRUG SCREEN (HOSP PERFORMED)    Imaging Review Ct Head Wo Contrast  08/14/2014   CLINICAL DATA:  Multiple seizures. Cardiac arrest last June with seizures since then. Right-sided head is tender.  EXAM: CT HEAD WITHOUT CONTRAST  TECHNIQUE: Contiguous axial images were obtained from the base of the skull through the vertex without intravenous contrast.  COMPARISON:  06/29/2014  FINDINGS: Ventricles and sulci are symmetrical. No mass  effect or midline shift. No abnormal extra-axial fluid collections. Gray-white matter junctions are distinct. Basal cisterns are not effaced. No evidence of acute intracranial hemorrhage. No depressed skull fractures. Visualized paranasal sinuses and mastoid air cells are not opacified.  IMPRESSION: No acute intracranial abnormalities.  Normal examination.   Electronically Signed   By: Lucienne Capers M.D.   On: 08/14/2014 01:10     EKG Interpretation None     No further episodes of seizure like activity  [facility] will to transport patient home and stay with her  MDM   Final diagnoses:  Seizure disorder  Alcoholism         Garald Balding, NP 08/14/14 0221  Wynetta Fines, MD 08/14/14 [phone]

## 2014-08-14 NOTE — ED Notes (Signed)
Pts ETOH 419. Dr. Informed.

## 2014-08-15 ENCOUNTER — Emergency Department (HOSPITAL_COMMUNITY)
Admission: EM | Admit: 2014-08-15 | Discharge: 2014-08-16 | Disposition: A | Discharge status: Home / Self Care | Payer: Self-pay | Attending: Emergency Medicine | Admitting: Emergency Medicine

## 2014-08-15 ENCOUNTER — Encounter (HOSPITAL_COMMUNITY): Payer: Self-pay

## 2014-08-15 ENCOUNTER — Emergency Department (HOSPITAL_COMMUNITY): Payer: Self-pay

## 2014-08-15 DIAGNOSIS — F1010 Alcohol abuse, uncomplicated: Secondary | ICD-10-CM | POA: Insufficient documentation

## 2014-08-15 DIAGNOSIS — R1011 Right upper quadrant pain: Secondary | ICD-10-CM | POA: Insufficient documentation

## 2014-08-15 DIAGNOSIS — R0789 Other chest pain: Secondary | ICD-10-CM | POA: Insufficient documentation

## 2014-08-15 DIAGNOSIS — Z8679 Personal history of other diseases of the circulatory system: Secondary | ICD-10-CM | POA: Insufficient documentation

## 2014-08-15 DIAGNOSIS — F329 Major depressive disorder, single episode, unspecified: Secondary | ICD-10-CM | POA: Insufficient documentation

## 2014-08-15 DIAGNOSIS — Z8719 Personal history of other diseases of the digestive system: Secondary | ICD-10-CM | POA: Insufficient documentation

## 2014-08-15 DIAGNOSIS — Z79899 Other long term (current) drug therapy: Secondary | ICD-10-CM | POA: Insufficient documentation

## 2014-08-15 DIAGNOSIS — D649 Anemia, unspecified: Secondary | ICD-10-CM | POA: Insufficient documentation

## 2014-08-15 DIAGNOSIS — G40909 Epilepsy, unspecified, not intractable, without status epilepticus: Secondary | ICD-10-CM | POA: Insufficient documentation

## 2014-08-15 DIAGNOSIS — Z3202 Encounter for pregnancy test, result negative: Secondary | ICD-10-CM | POA: Insufficient documentation

## 2014-08-15 DIAGNOSIS — F419 Anxiety disorder, unspecified: Secondary | ICD-10-CM | POA: Insufficient documentation

## 2014-08-15 DIAGNOSIS — F445 Conversion disorder with seizures or convulsions: Secondary | ICD-10-CM

## 2014-08-15 LAB — BASIC METABOLIC PANEL
ANION GAP: 13 (ref 5–15)
BUN: 11 mg/dL (ref 6–23)
CO2: 25 mmol/L (ref 19–32)
Calcium: 8.2 mg/dL — ABNORMAL LOW (ref 8.4–10.5)
Chloride: 102 mmol/L (ref 96–112)
Creatinine, Ser: 0.48 mg/dL — ABNORMAL LOW (ref 0.50–1.10)
GFR calc Af Amer: >90 mL/min (ref >90)
GFR calc non Af Amer: >90 mL/min (ref >90)
GLUCOSE: 110 mg/dL — AB (ref 70–99)
Potassium: 3.6 mmol/L (ref 3.5–5.1)
Sodium: 140 mmol/L (ref 135–145)

## 2014-08-15 LAB — I-STAT CHEM 8, ED
BUN: 10 mg/dL (ref 6–23)
CALCIUM ION: 1.01 mmol/L — AB (ref 1.12–1.23)
Chloride: 105 mmol/L (ref 96–112)
Creatinine, Ser: 1.1 mg/dL (ref 0.50–1.10)
Glucose, Bld: 106 mg/dL — ABNORMAL HIGH (ref 70–99)
HEMATOCRIT: 36 % (ref 36.0–46.0)
Hemoglobin: 12.2 g/dL (ref 12.0–15.0)
Potassium: 3.7 mmol/L (ref 3.5–5.1)
Sodium: 141 mmol/L (ref 135–145)
TCO2: 22 mmol/L (ref 0–100)

## 2014-08-15 LAB — CBC
HCT: 32.6 % — ABNORMAL LOW (ref 36.0–46.0)
Hemoglobin: 10.6 g/dL — ABNORMAL LOW (ref 12.0–15.0)
MCH: 31.7 pg (ref 26.0–34.0)
MCHC: 32.5 g/dL (ref 30.0–36.0)
MCV: 97.6 fL (ref 78.0–100.0)
PLATELETS: 127 10*3/uL — AB (ref 150–400)
RBC: 3.34 MIL/uL — AB (ref 3.87–5.11)
RDW: 16.6 % — AB (ref 11.5–15.5)
WBC: 2.4 10*3/uL — ABNORMAL LOW (ref 4.0–10.5)

## 2014-08-15 LAB — I-STAT TROPONIN, ED: Troponin i, poc: 0 ng/mL (ref 0.00–0.08)

## 2014-08-15 LAB — URINALYSIS, ROUTINE W REFLEX MICROSCOPIC
Bilirubin Urine: NEGATIVE
GLUCOSE, UA: NEGATIVE mg/dL
Hgb urine dipstick: NEGATIVE
KETONES UR: NEGATIVE mg/dL
Leukocytes, UA: NEGATIVE
Nitrite: NEGATIVE
Protein, ur: NEGATIVE mg/dL
Specific Gravity, Urine: 1.016 (ref 1.005–1.030)
Urobilinogen, UA: 1 mg/dL (ref 0.0–1.0)
pH: 6.5 (ref 5.0–8.0)

## 2014-08-15 LAB — ETHANOL: Alcohol, Ethyl (B): 347 mg/dL — ABNORMAL HIGH (ref 0–9)

## 2014-08-15 LAB — PREGNANCY, URINE: Preg Test, Ur: NEGATIVE

## 2014-08-15 LAB — BRAIN NATRIURETIC PEPTIDE: B Natriuretic Peptide: 43.8 pg/mL (ref 0.0–100.0)

## 2014-08-15 MED ORDER — ACETAMINOPHEN 500 MG PO TABS
500.0000 mg | ORAL_TABLET | Freq: Once | ORAL | Status: AC
  Administered 2014-08-15: 500 mg via ORAL
  Filled 2014-08-15: qty 1

## 2014-08-15 MED ORDER — HYDROXYZINE HCL 10 MG PO TABS
10.0000 mg | ORAL_TABLET | Freq: Once | ORAL | Status: AC
  Administered 2014-08-15: 10 mg via ORAL
  Filled 2014-08-15: qty 1

## 2014-08-15 NOTE — ED Provider Notes (Signed)
CSN: [id number]     Arrival date & time 08/15/14  1825 History   First MD Initiated Contact with Patient 08/15/14 2000     Chief Complaint  Patient presents with  . Tremors  . Chest Pain   Kristen Roberson is a 39 y.o. female with a history of alcoholism, pseudoseizures, anxiety, who presents to the ED complaining of right lower quadrant abdominal pain. She reports she has frequent pain here and has been evaluated in the ED for pain in her RLQ before. She reports her pain has been worse today and she rates her pain at an 8/10. She reports it is worse with anxiety and with bowel movement. Patient ports vomiting 4 times a day but denies current nausea. Patient reports she last ate prior to arrival in the [place] food which she kept down. Patient reports taking 600 mg ibuprofen earlier today without relief. Nursing note also indicated chest pain, which she acknowledged when asked. She says this is not why she came to the ED today. She reports substernal chest pain and she is to back. She also endorses palpitations and shortness of breath. She reports drinking alcohol today. She reports she has been compliant with taking Keppra. Her boyfriend is with her and reports he thinks this is all due to anxiety. The patient reports she is anxious. She denies fevers, chills, nausea, sick contacts, suspicious food intake, dysuria, hematuria, hematemesis.   (Consider location/radiation/quality/duration/timing/severity/associated sxs/prior Treatment) HPI  Past Medical History  Diagnosis Date  . Proctitis   . Cysts of both ovaries   . Anemia   . Anxiety   . Blood transfusion without reported diagnosis   . Depression   . Fatty liver 10/05/13  . Cardiac arrest   . Seizures    Past Surgical History  Procedure Laterality Date  . Ovarian cyst removal    . Laparoscopy N/A 09/28/2013    Procedure: LAPAROSCOPY OPERATIVE;  Surgeon: Terrance Mass, MD;  Location: [facility];  Service: Gynecology;  Laterality: N/A;  .  Laparoscopic appendectomy Right 09/28/2013    Procedure: APPENDECTOMY LAPAROSCOPIC;  Surgeon: Terrance Mass, MD;  Location: [facility];  Service: Gynecology;  Laterality: Right;  . Salpingoophorectomy Right 09/28/2013    Procedure: SALPINGO OOPHORECTOMY;  Surgeon: Terrance Mass, MD;  Location: [facility];  Service: Gynecology;  Laterality: Right;  . Colonoscopy N/A 09/30/2013    Procedure: COLONOSCOPY;  Surgeon: Lafayette Dragon, MD;  Location: [facility];  Service: Endoscopy;  Laterality: N/A;  . Esophagogastroduodenoscopy N/A 11/23/2013    Procedure: ESOPHAGOGASTRODUODENOSCOPY (EGD);  Surgeon: Jerene Bears, MD;  Location: [facility];  Service: Endoscopy;  Laterality: N/A;  . Appendectomy    . Left and right heart catheterization with coronary angiogram N/A 02/23/2014    Procedure: LEFT AND RIGHT HEART CATHETERIZATION WITH CORONARY ANGIOGRAM;  Surgeon: Leonie Man, MD;  Location: [facility];  Service: Cardiovascular;  Laterality: N/A;   Family History  Problem Relation Age of Onset  . Diabetes Mother   . Hyperlipidemia Mother   . Stroke Mother   . Diabetes Father    History  Substance Use Topics  . Smoking status: Never Smoker   . Smokeless tobacco: Never Used  . Alcohol Use: Yes   OB History    Gravida Para Term Preterm AB TAB SAB Ectopic Multiple Living   7 3   4  4   3      Review of Systems  Constitutional: Negative for fever and chills.  HENT:  Negative for congestion and sore throat.   Eyes: Negative for visual disturbance.  Respiratory: Positive for shortness of breath. Negative for cough and wheezing.   Cardiovascular: Positive for chest pain and palpitations. Negative for leg swelling.  Gastrointestinal: Positive for vomiting and abdominal pain. Negative for nausea and diarrhea.  Genitourinary: Negative for dysuria, urgency, frequency and hematuria.  Musculoskeletal: Negative for neck pain.  Skin: Negative for rash.  Neurological: Negative for headaches.       Allergies  Morphine and related; Tramadol; and Penicillins  Home Medications   Prior to Admission medications   Medication Sig Start Date End Date Taking? Authorizing Provider  carvedilol (COREG) 3.125 MG tablet Take 0.5 tablets (1.5625 mg total) by mouth 2 (two) times daily with a meal. For high blood pressure 07/04/14  Yes Encarnacion Slates, NP  disulfiram (ANTABUSE) 250 MG tablet Take 1 tablet (250 mg total) by mouth daily. For alcoholism 07/04/14  Yes Encarnacion Slates, NP  folic acid (FOLVITE) 1 MG tablet Take 1 tablet (1 mg total) by mouth daily. For low folate 07/04/14  Yes Encarnacion Slates, NP  hydrOXYzine (ATARAX/VISTARIL) 25 MG tablet Take 1 tablet (25 mg total) by mouth every 6 (six) hours as needed for anxiety. For anxiety 07/04/14  Yes Encarnacion Slates, NP  ibuprofen (ADVIL,MOTRIN) 200 MG tablet Take 400 mg by mouth every 6 (six) hours as needed for moderate pain (pain).   Yes Historical Provider, MD  levETIRAcetam (KEPPRA) 1000 MG tablet Take 1 tablet (1,000 mg total) by mouth daily. For seizure activities 07/04/14  Yes Encarnacion Slates, NP  mirtazapine (REMERON) 30 MG tablet Take 1 tablet (30 mg total) by mouth at bedtime. For depression/sleep Patient taking differently: Take 30 mg by mouth at bedtime as needed (for sleep/depression).  07/04/14  Yes Encarnacion Slates, NP  topiramate (TOPAMAX) 25 MG tablet Take 25 mg by mouth 2 (two) times daily.    Yes Historical Provider, MD  cephALEXin (KEFLEX) 500 MG capsule Take 1 capsule (500 mg total) by mouth every 8 (eight) hours. For infection Patient not taking: Reported on 07/27/2014 07/04/14   Encarnacion Slates, NP  citalopram (CELEXA) 20 MG tablet Take 1 tablet (20 mg total) by mouth daily. For depression Patient not taking: Reported on 08/14/2014 07/04/14   Encarnacion Slates, NP   BP 110/67 mmHg  Pulse 83  Temp(Src) 98.1 F (36.7 C) (Oral)  Resp 18  SpO2 97%  LMP 08/08/2014 Physical Exam  Constitutional: She is oriented to person, place, and time.  She appears well-developed and well-nourished. No distress.  HENT:  Head: Normocephalic and atraumatic.  Mouth/Throat: Oropharynx is clear and moist. No oropharyngeal exudate.  Eyes: Conjunctivae and EOM are normal. Pupils are equal, round, and reactive to light. Right eye exhibits no discharge. Left eye exhibits no discharge.  Neck: Neck supple.  Cardiovascular: Normal rate, regular rhythm, normal heart sounds and intact distal pulses.  Exam reveals no gallop and no friction rub.   No murmur heard. Pulmonary/Chest: Effort normal and breath sounds normal. No respiratory distress. She has no wheezes. She has no rales.  Abdominal: Soft. Bowel sounds are normal. She exhibits no distension and no mass. There is tenderness. There is no rebound.  Abdomen soft. Bowel sounds are present. Patient has right lower quadrant tenderness to palpation. Negative Rovsing sign. Negative psoas and obturator sign.  Musculoskeletal: She exhibits no edema.  Patient is spontaneously moving all extremities in a coordinated fashion exhibiting good strength.  Lymphadenopathy:    She has no cervical adenopathy.  Neurological: She is alert and oriented to person, place, and time. Coordination normal.  Skin: Skin is warm and dry. No rash noted. She is not diaphoretic. No erythema. No pallor.  Psychiatric: Her behavior is normal. Her mood appears anxious.  Patient appears slightly anxious.   Nursing note and vitals reviewed.   ED Course  Procedures (including critical care time) Labs Review Labs Reviewed  CBC - Abnormal; Notable for the following:    WBC 2.4 (*)    RBC 3.34 (*)    Hemoglobin 10.6 (*)    HCT 32.6 (*)    RDW 16.6 (*)    Platelets 127 (*)    All other components within normal limits  BASIC METABOLIC PANEL - Abnormal; Notable for the following:    Glucose, Bld 110 (*)    Creatinine, Ser 0.48 (*)    Calcium 8.2 (*)    All other components within normal limits  ETHANOL - Abnormal; Notable for  the following:    Alcohol, Ethyl (B) 347 (*)    All other components within normal limits  I-STAT CHEM 8, ED - Abnormal; Notable for the following:    Glucose, Bld 106 (*)    Calcium, Ion 1.01 (*)    All other components within normal limits  BRAIN NATRIURETIC PEPTIDE  URINALYSIS, ROUTINE W REFLEX MICROSCOPIC  PREGNANCY, URINE  I-STAT TROPOININ, ED    Imaging Review Dg Chest 2 View  08/15/2014   CLINICAL DATA:  Initial encounter for chest pain that began this afternoon  EXAM: CHEST  2 VIEW  COMPARISON:  07/27/2014  FINDINGS: The heart size and mediastinal contours are within normal limits. Both lungs are clear. The visualized skeletal structures are unremarkable.  IMPRESSION: No active cardiopulmonary disease.   Electronically Signed   By: Misty Stanley M.D.   On: 08/15/2014 20:58     EKG Interpretation None      Filed Vitals:   08/15/14 1839 08/15/14 2127 08/15/14 2153 08/16/14 0015  BP: 125/74 92/54 116/85 110/67  Pulse: 89 100 78 83  Temp: 98.1 F (36.7 C) 98 F (36.7 C)  98.1 F (36.7 C)  TempSrc: Oral Oral    Resp: 18 20 18 18   SpO2: 96% 100% 100% 97%     MDM   Final diagnoses:  Anxiety  Atypical chest pain  Pseudoseizure  Alcohol abuse   Kristen Roberson is a 39 y.o. female with a history of alcoholism, pseudoseizures, anxiety, who presents to the ED complaining of right lower quadrant abdominal pain. She reports she has frequent pain here and has been evaluated in the ED for pain in her RLQ before. She also reported chest pain that is worse with anxiety. Boyfriend is at bedside. Patient is afebrile and nontoxic-appearing. She has had a previous appendectomy and right oophorectomy.  She is tender in RLQ. No peritoneal signs. She has frequent pain here. She has a negative urine pregnancy test. Urinalysis is unremarkable. CBC and CMP are unremarkable. She has a negative troponin. Patient's alcohol level is 347. This is regularly the case with her. She is with her boyfriend  who is sober. Chest x ray is negative.   AT 2100 I was called to bedside by RN for "seizure like activity." Patient is moving her shoulders up and down and turns to face you when asked questions. She would not answer questions. Her airway is intact. Her "seizure like activity" ended shortly after I walked  up to see the patient.  Patient had another pseudoseizure later and when I assessed her she asked me why she was having all these seizures while she was shaking. Patient reports having intermittent weakness on left side and was later seen walking around room and putting on her clothes without difficulty. She was able to walk without difficulty.   At revaluation prior to discharge the patient reports her pain has improved and she is ready to go home. I have reviewed records of multiple ED visits for similar or other pain-related complaints, usually with negative workups. I feel that the patient's pain is chronic and cannot be appropriately or safely treated in emergency department setting. I do not feel that providing a prescription for narcotic pain medicine is in this patient's best interest. I have urged patient to follow-up closely with their primary care physician or pain specialist. I have explicitly discussed with the patient on precautions and have reassured him that they can always be seen and evaluated in the emergency department for any condition that they feel is emergent, and that they will be given treatment as the emergency physician feels appropriate and safe, but this may not involve the use of narcotic pain medications. The patient was given the opportunity to voice any further questions or concerns and these were addressed to the best of my ability. Advised patient to seek help for alcohol use and given resources. I advised the patient to follow-up with their primary care provider this week. I advised the patient to return to the emergency department with new or worsening symptoms or new  concerns. The patient verbalized understanding and agreement with plan.   This patient was discussed with Dr. Regenia Skeeter who agrees with assessment and plan.      Hanley Hays, PA-C 08/16/14 0454  Ephraim Hamburger, MD 08/25/14 [phone]

## 2014-08-15 NOTE — ED Notes (Addendum)
Pt just had a seizure. RN and MD notified.

## 2014-08-15 NOTE — ED Notes (Addendum)
Pt attempted to walk to the bathroom to get a urine sample, but fell and seized in hallway. RN notified of fall and seizure.

## 2014-08-15 NOTE — ED Notes (Addendum)
PT shaking and spitting and not answering questions; pt protects face and when arms are raised above; pt with hx of pseudoseizures

## 2014-08-15 NOTE — ED Notes (Addendum)
Per EMS, Pt, from home, c/o R chest pain radiating into R shoulder, tremors, RLQ abdominal pain, SOB, and R side weakness starting today.  Pain score 10/10.  Pt told EMS that she was seen x 2 days ago for the same.  NAD noted.  Pt speaking in full sentences.

## 2014-08-15 NOTE — ED Notes (Signed)
Pt is aware that a urine sample is needed.  

## 2014-08-16 NOTE — Discharge Instructions (Signed)
Alcohol and Nutrition Nutrition serves two purposes. It provides energy. It also maintains body structure and function. Food supplies energy. It also provides the building blocks needed to replace worn or damaged cells. Alcoholics often eat poorly. This limits their supply of essential nutrients. This affects energy supply and structure maintenance. Alcohol also affects the body's nutrients in:  Digestion.  Storage.  Using and getting rid of waste products. IMPAIRMENT OF NUTRIENT DIGESTION AND UTILIZATION   Once ingested, food must be broken down into small components (digested). Then it is available for energy. It helps maintain body structure and function. Digestion begins in the mouth. It continues in the stomach and intestines, with help from the pancreas. The nutrients from digested food are absorbed from the intestines into the blood. Then they are carried to the liver. The liver prepares nutrients for:  Immediate use.  Storage and future use.  Alcohol inhibits the breakdown of nutrients into usable molecules.  It decreases secretion of digestive enzymes from the pancreas.  Alcohol impairs nutrient absorption by damaging the cells lining the stomach and intestines.  It also interferes with moving some nutrients into the blood.  In addition, nutritional deficiencies themselves may lead to further absorption problems.  For example, folate deficiency changes the cells that line the small intestine. This impairs how water is absorbed. It also affects absorbed nutrients. These include glucose, sodium, and additional folate.  Even if nutrients are digested and absorbed, alcohol can prevent them from being fully used. It changes their transport, storage, and excretion. Impaired utilization of nutrients by alcoholics is indicated by:  Decreased liver stores of vitamins, such as vitamin A.  Increased excretion of nutrients such as fat. ALCOHOL AND ENERGY SUPPLY   Three basic  nutritional components found in food are:  Carbohydrates.  Proteins.  Fats.  These are used as energy. Some alcoholics take in as much as 50% of their total daily calories from alcohol. They often neglect important foods.  Even when enough food is eaten, alcohol can impair the ways the body controls blood sugar (glucose) levels. It may either increase or decrease blood sugar.  In non-diabetic alcoholics, increased blood sugar (hyperglycemia) is caused by poor insulin secretion. It is usually temporary.  Decreased blood sugar (hypoglycemia) can cause serious injury even if this condition is short-lived. Low blood sugar can happen when a fasting or malnourished person drinks alcohol. When there is no food to supply energy, stored sugar is used up. The products of alcohol inhibit forming glucose from other compounds such as amino acids. As a result, alcohol causes the brain and other body tissue to lack glucose. It is needed for energy and function.  Alcohol is an energy source. But how the body processes and uses the energy from alcohol is complex. Also, when alcohol is substituted for carbohydrates, subjects tend to lose weight. This indicates that they get less energy from alcohol than from food. ALCOHOL - MAINTAINING CELL STRUCTURE AND FUNCTION  Structure Cells are made mostly of protein. So an adequate protein diet is important for maintaining cell structure. This is especially true if cells are being damaged. Research indicates that alcohol affects protein nutrition by causing impaired:  Digestion of proteins to amino acids.  Processing of amino acids by the small intestine and liver.  Synthesis of proteins from amino acids.  Protein secretion by the liver. Function Nutrients are essential for the body to function well. They provide the tools that the body needs to work well:  Proteins.  Vitamins.  Minerals. Alcohol can disrupt body function. It may cause nutrient  deficiencies. And it may interfere with the way nutrients are processed. Vitamins  Vitamins are essential to maintain growth and normal metabolism. They regulate many of the body`s processes. Chronic heavy drinking causes deficiencies in many vitamins. This is caused by eating less. And, in some cases, vitamins may be poorly absorbed. For example, alcohol inhibits fat absorption. It impairs how the vitamins A, E, and D are normally absorbed along with dietary fats. Not enough vitamin A may cause night blindness. Not enough vitamin D may cause softening of the bones.  Some alcoholics lack vitamins A, C, D, E, K, and the B vitamins. These are all involved in wound healing and cell maintenance. In particular, because vitamin K is necessary for blood clotting, lacking that vitamin can cause delayed clotting. The result is excess bleeding. Lacking other vitamins involved in brain function may cause severe neurological damage. Minerals Deficiencies of minerals such as calcium, magnesium, iron, and zinc are common in alcoholics. The alcohol itself does not seem to affect how these minerals are absorbed. Rather, they seem to occur secondary to other alcohol-related problems, such as:  Less calcium absorbed.  Not enough magnesium.  More urinary excretion.  Vomiting.  Diarrhea.  Not enough iron due to gastrointestinal bleeding.  Not enough zinc or losses related to other nutrient deficiencies.  Mineral deficiencies can cause a variety of medical consequences. These range from calcium-related bone disease to zinc-related night blindness and skin lesions. ALCOHOL, MALNUTRITION, AND MEDICAL COMPLICATIONS  Liver Disease   Alcoholic liver damage is caused primarily by alcohol itself. But poor nutrition may increase the risk of alcohol-related liver damage. For example, nutrients normally found in the liver are known to be affected by drinking alcohol. These include carotenoids, which are the major  sources of vitamin A, and vitamin E compounds. Decreases in such nutrients may play some role in alcohol-related liver damage. Pancreatitis  Research suggests that malnutrition may increase the risk of developing alcoholic pancreatitis. Research suggests that a diet lacking in protein may increase alcohol's damaging effect on the pancreas. Brain  Nutritional deficiencies may have severe effects on brain function. These may be permanent. Specifically, thiamine deficiencies are often seen in alcoholics. They can cause severe neurological problems. These include:  Impaired movement.  Memory loss seen in Wernicke-Korsakoff syndrome. Pregnancy  Alcohol has toxic effects on fetal development. It causes alcohol-related birth defects. They include fetal alcohol syndrome. Alcohol itself is toxic to the fetus. Also, the nutritional deficiency can affect how the fetus develops. That may compound the risk of developmental damage.  Nutritional needs during pregnancy are 10% to 30% greater than normal. Food intake can increase by as much as 140% to cover the needs of both mother and fetus. An alcoholic mother`s nutritional problems may adversely affect the nutrition of the fetus. And alcohol itself can also restrict nutrition flow to the fetus. NUTRITIONAL STATUS OF ALCOHOLICS  Techniques for assessing nutritional status include:  Taking body measurements to estimate fat reserves. They include:  Weight.  Height.  Mass.  Skin fold thickness.  Performing blood analysis to provide measurements of circulating:  Proteins.  Vitamins.  Minerals.  These techniques tend to be imprecise. For many nutrients, there is no clear "cut-off" point that would allow an accurate definition of deficiency. So assessing the nutritional status of alcoholics is limited by these techniques. Dietary status may provide information about the risk of developing nutritional problems.  Dietary status is assessed by:  Taking  patients' dietary histories.  Evaluating the amount and types of food they are eating.  It is difficult to determine what exact amount of alcohol begins to have damaging effects on nutrition. In general, moderate drinkers have 2 drinks or less per day. They seem to be at little risk for nutritional problems. Various medical disorders begin to appear at greater levels.  Research indicates that the majority of even the heaviest drinkers have few obvious nutritional deficiencies. Many alcoholics who are hospitalized for medical complications of their disease do have severe malnutrition. Alcoholics tend to eat poorly. Often they eat less than the amounts of food necessary to provide enough:  Carbohydrates.  Protein.  Fat.  Vitamins A and C.  B vitamins.  Minerals like calcium and iron. Of major concern is alcohol's effect on digesting food and use of nutrients. It may shift a mildly malnourished person toward severe malnutrition. Document Released: 04/30/2005 Document Revised: 09/28/2011 Document Reviewed: 10/14/2005 [facility] Patient Information 2015 [place], [place]. This information is not intended to replace advice given to you by your health care provider. Make sure you discuss any questions you have with your health care provider.  Alcohol Use Disorder Alcohol use disorder is a mental disorder. It is not a one-time incident of heavy drinking. Alcohol use disorder is the excessive and uncontrollable use of alcohol over time that leads to problems with functioning in one or more areas of daily living. People with this disorder risk harming themselves and others when they drink to excess. Alcohol use disorder also can cause other mental disorders, such as mood and anxiety disorders, and serious physical problems. People with alcohol use disorder often misuse other drugs.  Alcohol use disorder is common and widespread. Some people with this disorder drink alcohol to cope with or escape from  negative life events. Others drink to relieve chronic pain or symptoms of mental illness. People with a family history of alcohol use disorder are at higher risk of losing control and using alcohol to excess.  SYMPTOMS  Signs and symptoms of alcohol use disorder may include the following:   Consumption ofalcohol inlarger amounts or over a longer period of time than intended.  Multiple unsuccessful attempts to cutdown or control alcohol use.   A great deal of time spent obtaining alcohol, using alcohol, or recovering from the effects of alcohol (hangover).  A strong desire or urge to use alcohol (cravings).   Continued use of alcohol despite problems at work, school, or home because of alcohol use.   Continued use of alcohol despite problems in relationships because of alcohol use.  Continued use of alcohol in situations when it is physically hazardous, such as driving a car.  Continued use of alcohol despite awareness of a physical or psychological problem that is likely related to alcohol use. Physical problems related to alcohol use can involve the brain, heart, liver, stomach, and intestines. Psychological problems related to alcohol use include intoxication, depression, anxiety, psychosis, delirium, and dementia.   The need for increased amounts of alcohol to achieve the same desired effect, or a decreased effect from the consumption of the same amount of alcohol (tolerance).  Withdrawal symptoms upon reducing or stopping alcohol use, or alcohol use to reduce or avoid withdrawal symptoms. Withdrawal symptoms include:  Racing heart.  Hand tremor.  Difficulty sleeping.  Nausea.  Vomiting.  Hallucinations.  Restlessness.  Seizures. DIAGNOSIS Alcohol use disorder is diagnosed through an assessment by your health care  provider. Your health care provider may start by asking three or four questions to screen for excessive or problematic alcohol use. To confirm a diagnosis  of alcohol use disorder, at least two symptoms must be present within a 55-month period. The severity of alcohol use disorder depends on the number of symptoms:  Mild--two or three.  Moderate--four or five.  Severe--six or more. Your health care provider may perform a physical exam or use results from lab tests to see if you have physical problems resulting from alcohol use. Your health care provider may refer you to a mental health professional for evaluation. TREATMENT  Some people with alcohol use disorder are able to reduce their alcohol use to low-risk levels. Some people with alcohol use disorder need to quit drinking alcohol. When necessary, mental health professionals with specialized training in substance use treatment can help. Your health care provider can help you decide how severe your alcohol use disorder is and what type of treatment you need. The following forms of treatment are available:   Detoxification. Detoxification involves the use of prescription medicines to prevent alcohol withdrawal symptoms in the first week after quitting. This is important for people with a history of symptoms of withdrawal and for heavy drinkers who are likely to have withdrawal symptoms. Alcohol withdrawal can be dangerous and, in severe cases, cause death. Detoxification is usually provided in a hospital or in-patient substance use treatment facility.  Counseling or talk therapy. Talk therapy is provided by substance use treatment counselors. It addresses the reasons people use alcohol and ways to keep them from drinking again. The goals of talk therapy are to help people with alcohol use disorder find healthy activities and ways to cope with life stress, to identify and avoid triggers for alcohol use, and to handle cravings, which can cause relapse.  Medicines.Different medicines can help treat alcohol use disorder through the following actions:  Decrease alcohol cravings.  Decrease the positive  reward response felt from alcohol use.  Produce an uncomfortable physical reaction when alcohol is used (aversion therapy).  Support groups. Support groups are run by people who have quit drinking. They provide emotional support, advice, and guidance. These forms of treatment are often combined. Some people with alcohol use disorder benefit from intensive combination treatment provided by specialized substance use treatment centers. Both inpatient and outpatient treatment programs are available. Document Released: 08/13/2004 Document Revised: 11/20/2013 Document Reviewed: 10/13/2012 [facility] Patient Information 2015 [place], [place]. This information is not intended to replace advice given to you by your health care provider. Make sure you discuss any questions you have with your health care provider. Generalized Anxiety Disorder Generalized anxiety disorder (GAD) is a mental disorder. It interferes with life functions, including relationships, work, and school. GAD is different from normal anxiety, which everyone experiences at some point in their lives in response to specific life events and activities. Normal anxiety actually helps [place] prepare for and get through these life events and activities. Normal anxiety goes away after the event or activity is over.  GAD causes anxiety that is not necessarily related to specific events or activities. It also causes excess anxiety in proportion to specific events or activities. The anxiety associated with GAD is also difficult to control. GAD can vary from mild to severe. People with severe GAD can have intense waves of anxiety with physical symptoms (panic attacks).  SYMPTOMS The anxiety and worry associated with GAD are difficult to control. This anxiety and worry are related to many life  events and activities and also occur more days than not for 6 months or longer. People with GAD also have three or more of the following symptoms (one or more in  children):  Restlessness.   Fatigue.  Difficulty concentrating.   Irritability.  Muscle tension.  Difficulty sleeping or unsatisfying sleep. DIAGNOSIS GAD is diagnosed through an assessment by your health care provider. Your health care provider will ask you questions aboutyour mood,physical symptoms, and events in your life. Your health care provider may ask you about your medical history and use of alcohol or drugs, including prescription medicines. Your health care provider may also do a physical exam and blood tests. Certain medical conditions and the use of certain substances can cause symptoms similar to those associated with GAD. Your health care provider may refer you to a mental health specialist for further evaluation. TREATMENT The following therapies are usually used to treat GAD:   Medication. Antidepressant medication usually is prescribed for long-term daily control. Antianxiety medicines may be added in severe cases, especially when panic attacks occur.   Talk therapy (psychotherapy). Certain types of talk therapy can be helpful in treating GAD by providing support, education, and guidance. A form of talk therapy called cognitive behavioral therapy can teach you healthy ways to think about and react to daily life events and activities.  Stress managementtechniques. These include yoga, meditation, and exercise and can be very helpful when they are practiced regularly. A mental health specialist can help determine which treatment is best for you. Some people see improvement with one therapy. However, other people require a combination of therapies. Document Released: 10/31/2012 Document Revised: 11/20/2013 Document Reviewed: 10/31/2012 [facility] Patient Information 2015 [place], [place]. This information is not intended to replace advice given to you by your health care provider. Make sure you discuss any questions you have with your health care provider.  Emergency  Department Resource Guide 1) Find a Doctor and Pay Out of Pocket Although you won't have to find out who is covered by your insurance plan, it is a good idea to ask around and get recommendations. You will then need to call the office and see if the doctor you have chosen will accept you as a new patient and what types of options they offer for patients who are self-pay. Some doctors offer discounts or will set up payment plans for their patients who do not have insurance, but you will need to ask so you aren't surprised when you get to your appointment.  2) Contact Your Local Health Department Not all health departments have doctors that can see patients for sick visits, but many do, so it is worth a call to see if yours does. If you don't know where your local health department is, you can check in your phone book. The CDC also has a tool to help you locate your state's health department, and many state websites also have listings of all of their local health departments.  3) Find a [facility] If your illness is not likely to be very severe or complicated, you may want to try a walk in clinic. These are popping up all over the country in pharmacies, drugstores, and shopping centers. They're usually staffed by nurse practitioners or physician assistants that have been trained to treat common illnesses and complaints. They're usually fairly quick and inexpensive. However, if you have serious medical issues or chronic medical problems, these are probably not your best option.  No Primary Care Doctor: - Call Health  Connect at  [phone] - they can help you locate a primary care doctor that  accepts your insurance, provides certain services, etc. - Physician Referral Service- [phone]  Chronic Pain Problems: Organization         Address  Phone   Notes  [facility]  [phone] Patients need to be referred by their primary care doctor.   Medication  Assistance: Organization         Address  Phone   Notes  [facility] Medication [facility] [address] [place], [place] [address] [phone] --Must be a resident of [facility] -- Must have NO insurance coverage whatsoever (no Medicaid/ Medicare, etc.) -- The pt. MUST have a primary care doctor that directs their care regularly and follows them in the community   [facility]  [phone]   [employer]  [phone]    Agencies that provide inexpensive medical care: Organization         Address  Phone   Notes  [place]  [phone]   [facility]    [phone]   [facility] [place], [place] [address] [phone]   [place] [address] [place] [phone]   [facility]    [phone]   [facility]    [phone]   [place] [address] [place] [address], [place] Phone:  [phone], Fax:  [phone] Hours of Operation:  9 am - 6 pm, M-F.  Also accepts Medicaid/Medicare and self-pay.  [facility] for [place] [address], [address], [place] Phone: [phone], Fax: [phone]. Hours of Operation:  8:30 am - 5:30 pm, M-F.  Also accepts Medicaid and self-pay.  [facility] [place] [address] [place] Phone: [phone]   [place], [place], [place] [phone], Ext. [phone] Mondays & Thursdays: 7-9 AM.  First 15 patients are seen on a first come, first serve basis.    [place] Providers:  Organization         Address  Phone   Notes  [facility] [address], [place] [phone] Also accepts self-pay patients.  [facility] [address], [place]  [phone]   [place], [address], [place]  [phone]   [facility] [address] [place] [phone]   Lucianne Lei [address], [place]   [phone] Only accepts [place] Access [place] patients after they have their name applied to their card.   Self-Pay (no insurance) in [facility]:  Organization         Address  Phone   Notes  Sickle Cell Patients, [facility] [place] [phone]   [facility] [place] [phone]   [facility] [place]  [place], [place], [place] [phone]   [facility]/Dr. Osei-Bonsu  [address] [place] or [address], [place] [phone] Phone number for both [place] and [place] locations is the same.  Urgent Medical and [facility] [address], [place] [phone]   [facility] [phone] [place]  [address], [place] or [address] [phone]   [facility] [place] [phone], phone; [phone], fax Sees patients 1st and 3rd Saturday of every month.  Must not qualify for public or private insurance (i.e. Medicaid, Medicare, [place] Health Choice, Veterans' Benefits)  Household income should be no more than 200% of the poverty level The clinic cannot treat you if you are pregnant or think you are pregnant  Sexually transmitted diseases are not treated at the clinic.    Dental Care: Organization         Address  Phone  Notes  [facility] Department of [facility] [place] [phone] Accepts children up to age 53 who are enrolled in [place] or [place]; pregnant women with a Medicaid card; and children who have applied for Medicaid or [place] Health Choice, but were declined, whose parents can pay a reduced fee at time of service.  [facility]  Department of [facility]  [address], [place] [phone] Accepts children up to age 6 who are enrolled in [place] or [place]; pregnant women with a Medicaid card; and children who have applied for Medicaid or [place] Health Choice, but were declined, whose parents can pay a reduced fee at time of service.  [facility] Adult [facility] Access PROGRAM  [place] [phone] Patients are seen by appointment only. Walk-ins are not accepted. [place] will see patients 40 years of age and older. Monday - Tuesday (8am-5pm) Most Wednesdays (8:30-5pm) $30 per visit, cash only  [facility] Adult Dental Access PROGRAM  [address], [facility] [phone] Patients are seen by appointment only. Walk-ins are not accepted. [place] will see patients 39 years of age and older. One Wednesday Evening (Monthly: Volunteer Based).  $30 per visit, cash only  [place]  [phone] for adults; Children under age 7, call Graduate Pediatric Dentistry at [phone]. Children aged 9-14, please call [phone] to request a pediatric application.  Dental services are provided in all areas of dental care including fillings, crowns and bridges, complete and partial dentures, implants, gum treatment, root canals, and extractions. Preventive care is also provided. Treatment is provided to both adults and children. Patients are selected via a lottery and there is often a waiting list.   [facility] [address], [place]  [phone] [email or web address]   [facility] [address] [place], [place] [phone], Ext. [phone] Second and Fourth Thursday of each month, opens at 6:30 AM; Clinic ends at 9 AM.  Patients are seen on a first-come first-served basis, and a limited number are seen during each clinic.   [facility]  [address] Hillard Danker [place], [place] [phone]    Eligibility Requirements You must have lived in [place], [place], or [place] counties for at least the last three months.   You cannot be eligible for state or federal sponsored [employer], including [employer], [place], or [employer].   You generally cannot be eligible for healthcare insurance through your employer.    How to apply: Eligibility screenings are held every Tuesday and Wednesday afternoon from 1:00 pm until 4:00 pm. You do not need an appointment for the interview!  [facility] [address] [place], [place]   [place]  [place] Department  [place]  [phone]    Behavioral Health Resources in the Community: Intensive Outpatient Programs Organization         Address  Phone  Notes  [place] [address], [place], [place] [phone]   [facility] [address] [place], [place]   [facility]: [facility] [address], [place], [place]   [place] [address]  [place], [place] or [phone]   Substance Abuse Resources Organization         Address  Phone  Notes  [facility]  [phone]   [place]  [phone]   [facility]   Chinita Pester  [phone]   Residential & Outpatient Substance Abuse Program  [phone]   Psychological Services Organization         Address  Phone  Notes  [facility] [place]  [place]  [phone]   [place] [address] [place] or [phone]    Mobile Crisis Teams Organization         Address  Phone  Notes  [facility], Mobile Crisis Care Unit  [phone]   [facility]  [address]  [place], [place]   [place] [address], [place] [phone]    Self-Help/Support Groups Organization         Address  Phone             Notes  [place] [place] - variety of support groups  [place] Call for more information  Narcotics Anonymous (NA), [facility] [address], [employer] [place]  2 meetings at this location   Special educational needs teacher         Address  Phone  Notes  [facility] [place],    [place]  [phone]   [facility]  [address], [place] [address], [place], [place]   [place], [place] [phone] Admissions: 8am-3pm M-F  Incentives Substance [place] [address]    [place], [place] [phone]   [facility] [address] [place], [place], [place]   [facility] [address]  [place], [place]   [facility] - Intensive Outpatient [address]een [address], [place], [place]   [facility].,  [place], [place] [phone] or [phone]   [facility] ([facility]) [address] [place], [place] Accepts Medicaid  [facility] [place] [address]  [place] [phone] Substance Abuse/Addiction Treatment   [facility] Organization         Address  Phone  Notes  [facility]  [phone]   Domenic Schwab, PhD [address] [place]lis [place], [place]   [phone] or [phone]   [place], [place] [phone]   [place] [address] [place], [place] [phone] Insurance/Medicaid/sponsorship through [employer] and Families [address] [place] [address]  [place], [place] [phone] [place], [place] [phone]    Dr. Adele Schilder  [phone]   [facility] Dept. 1) [address], [place] 2) [place] 3)  [place] [address], [place] [phone]  [phone]   [place] [phone] or [phone] (After Hours)

## 2014-10-30 ENCOUNTER — Emergency Department (HOSPITAL_COMMUNITY)
Admission: EM | Admit: 2014-10-30 | Discharge: 2014-10-31 | Disposition: A | Discharge status: Home / Self Care | Payer: Self-pay | Attending: Emergency Medicine | Admitting: Emergency Medicine

## 2014-10-30 ENCOUNTER — Encounter (HOSPITAL_COMMUNITY): Payer: Self-pay

## 2014-10-30 DIAGNOSIS — D649 Anemia, unspecified: Secondary | ICD-10-CM | POA: Insufficient documentation

## 2014-10-30 DIAGNOSIS — Z8719 Personal history of other diseases of the digestive system: Secondary | ICD-10-CM | POA: Insufficient documentation

## 2014-10-30 DIAGNOSIS — G40909 Epilepsy, unspecified, not intractable, without status epilepticus: Secondary | ICD-10-CM | POA: Insufficient documentation

## 2014-10-30 DIAGNOSIS — F10120 Alcohol abuse with intoxication, uncomplicated: Secondary | ICD-10-CM | POA: Insufficient documentation

## 2014-10-30 DIAGNOSIS — F1010 Alcohol abuse, uncomplicated: Secondary | ICD-10-CM

## 2014-10-30 DIAGNOSIS — Z8742 Personal history of other diseases of the female genital tract: Secondary | ICD-10-CM | POA: Insufficient documentation

## 2014-10-30 DIAGNOSIS — F10920 Alcohol use, unspecified with intoxication, uncomplicated: Secondary | ICD-10-CM

## 2014-10-30 DIAGNOSIS — Z8679 Personal history of other diseases of the circulatory system: Secondary | ICD-10-CM | POA: Insufficient documentation

## 2014-10-30 DIAGNOSIS — F32A Depression, unspecified: Secondary | ICD-10-CM

## 2014-10-30 DIAGNOSIS — F329 Major depressive disorder, single episode, unspecified: Secondary | ICD-10-CM | POA: Insufficient documentation

## 2014-10-30 DIAGNOSIS — Z79899 Other long term (current) drug therapy: Secondary | ICD-10-CM | POA: Insufficient documentation

## 2014-10-30 DIAGNOSIS — Z880 Allergy status to penicillin: Secondary | ICD-10-CM | POA: Insufficient documentation

## 2014-10-30 LAB — SALICYLATE LEVEL: Salicylate Lvl: <4 mg/dL (ref 2.8–20.0)

## 2014-10-30 LAB — COMPREHENSIVE METABOLIC PANEL
ALBUMIN: 4.5 g/dL (ref 3.5–5.2)
ALT: 47 U/L — AB (ref 0–35)
AST: 303 U/L — ABNORMAL HIGH (ref 0–37)
Alkaline Phosphatase: 181 U/L — ABNORMAL HIGH (ref 39–117)
Anion gap: 13 (ref 5–15)
BUN: <5 mg/dL — ABNORMAL LOW (ref 6–23)
CO2: 23 mmol/L (ref 19–32)
Calcium: 8.8 mg/dL (ref 8.4–10.5)
Chloride: 105 mmol/L (ref 96–112)
Creatinine, Ser: 0.33 mg/dL — ABNORMAL LOW (ref 0.50–1.10)
GFR calc Af Amer: >90 mL/min (ref >90)
GFR calc non Af Amer: >90 mL/min (ref >90)
GLUCOSE: 135 mg/dL — AB (ref 70–99)
POTASSIUM: 3.7 mmol/L (ref 3.5–5.1)
SODIUM: 141 mmol/L (ref 135–145)
TOTAL PROTEIN: 7.8 g/dL (ref 6.0–8.3)
Total Bilirubin: 1.1 mg/dL (ref 0.3–1.2)

## 2014-10-30 LAB — URINALYSIS, ROUTINE W REFLEX MICROSCOPIC
Bilirubin Urine: NEGATIVE
GLUCOSE, UA: NEGATIVE mg/dL
Hgb urine dipstick: NEGATIVE
KETONES UR: NEGATIVE mg/dL
Leukocytes, UA: NEGATIVE
NITRITE: NEGATIVE
Protein, ur: NEGATIVE mg/dL
SPECIFIC GRAVITY, URINE: 1.013 (ref 1.005–1.030)
Urobilinogen, UA: 1 mg/dL (ref 0.0–1.0)
pH: 5.5 (ref 5.0–8.0)

## 2014-10-30 LAB — CBC
HEMATOCRIT: 35.2 % — AB (ref 36.0–46.0)
Hemoglobin: 11.4 g/dL — ABNORMAL LOW (ref 12.0–15.0)
MCH: 33.4 pg (ref 26.0–34.0)
MCHC: 32.4 g/dL (ref 30.0–36.0)
MCV: 103.2 fL — AB (ref 78.0–100.0)
Platelets: 170 10*3/uL (ref 150–400)
RBC: 3.41 MIL/uL — ABNORMAL LOW (ref 3.87–5.11)
RDW: 14.3 % (ref 11.5–15.5)
WBC: 3.4 10*3/uL — ABNORMAL LOW (ref 4.0–10.5)

## 2014-10-30 LAB — RAPID URINE DRUG SCREEN, HOSP PERFORMED
Amphetamines: NOT DETECTED
BARBITURATES: NOT DETECTED
Benzodiazepines: NOT DETECTED
Cocaine: NOT DETECTED
Opiates: NOT DETECTED
TETRAHYDROCANNABINOL: NOT DETECTED

## 2014-10-30 LAB — ACETAMINOPHEN LEVEL: Acetaminophen (Tylenol), Serum: <10 ug/mL — ABNORMAL LOW (ref 10–30)

## 2014-10-30 LAB — ETHANOL: Alcohol, Ethyl (B): 418 mg/dL (ref 0–9)

## 2014-10-30 MED ORDER — CARVEDILOL 3.125 MG PO TABS
1.5625 mg | ORAL_TABLET | Freq: Two times a day (BID) | ORAL | Status: DC
  Filled 2014-10-30 (×4): qty 0.5

## 2014-10-30 MED ORDER — THIAMINE HCL 100 MG/ML IJ SOLN: 100.0000 mg | Freq: Every day | INTRAMUSCULAR | Status: DC

## 2014-10-30 MED ORDER — IBUPROFEN 200 MG PO TABS: 600.0000 mg | ORAL_TABLET | Freq: Three times a day (TID) | ORAL | Status: DC | PRN

## 2014-10-30 MED ORDER — FOLIC ACID 1 MG PO TABS
1.0000 mg | ORAL_TABLET | Freq: Every day | ORAL | Status: DC
  Administered 2014-10-30: 1 mg via ORAL
  Filled 2014-10-30: qty 1

## 2014-10-30 MED ORDER — HYDROXYZINE HCL 25 MG PO TABS: 25.0000 mg | ORAL_TABLET | Freq: Four times a day (QID) | ORAL | Status: DC | PRN

## 2014-10-30 MED ORDER — LEVETIRACETAM 500 MG PO TABS
1000.0000 mg | ORAL_TABLET | Freq: Every day | ORAL | Status: DC
  Administered 2014-10-30: 1000 mg via ORAL
  Filled 2014-10-30 (×2): qty 2

## 2014-10-30 MED ORDER — LORAZEPAM 1 MG PO TABS: 0.0000 mg | ORAL_TABLET | Freq: Four times a day (QID) | ORAL | Status: DC

## 2014-10-30 MED ORDER — LORAZEPAM 1 MG PO TABS: 1.0000 mg | ORAL_TABLET | Freq: Three times a day (TID) | ORAL | Status: DC | PRN

## 2014-10-30 MED ORDER — LORAZEPAM 1 MG PO TABS: 0.0000 mg | ORAL_TABLET | Freq: Two times a day (BID) | ORAL | Status: DC

## 2014-10-30 MED ORDER — ACETAMINOPHEN 325 MG PO TABS: 650.0000 mg | ORAL_TABLET | ORAL | Status: DC | PRN

## 2014-10-30 MED ORDER — TOPIRAMATE 25 MG PO TABS
25.0000 mg | ORAL_TABLET | Freq: Two times a day (BID) | ORAL | Status: DC
  Administered 2014-10-30: 25 mg via ORAL
  Filled 2014-10-30: qty 1

## 2014-10-30 MED ORDER — VITAMIN B-1 100 MG PO TABS
100.0000 mg | ORAL_TABLET | Freq: Every day | ORAL | Status: DC
  Administered 2014-10-30: 100 mg via ORAL
  Filled 2014-10-30: qty 1

## 2014-10-30 NOTE — ED Notes (Signed)
Pt stated she gave a urine sample earlier and cannot give one at this time.

## 2014-10-30 NOTE — ED Notes (Signed)
Per Dr. Alvino Chapel, patient to remain in ED until reevaluation in the morning. Patient made aware of same. Verbalizes understanding. No needs voiced at this time. Will continue to monitor.

## 2014-10-30 NOTE — ED Notes (Addendum)
Writer was doing her 15 minute rounding, as Probation officer went and checked in on the patient because she had observed the patient crying. I went in to talk to patient, held her hand and asked her to look at me because I had wanted to try and talk to her and get her to calm down. I explained I wanted her to look at me for comfort and for her to know that I was here for her. She looked up at me in my eyes and started to jerk and was foaming at the mouth, writer pushed the emergency button and also yelled for help. Aretha Parrot, police officer and tech John came in to assist. We moved patient to the emergency side of the hospital doctors and nurses came in and took over to help the patient

## 2014-10-30 NOTE — ED Notes (Signed)
Critical ETOH 418  MD notified

## 2014-10-30 NOTE — ED Notes (Signed)
Patient was witnessed by Sharyn Lull, NT to have seizure like activity. Seizure pads was  in place. Writer came to bedside and witnessed seizure like activity. Patient was turned on right side and and transferred to to treatment area RESA.Marland Kitchen

## 2014-10-30 NOTE — BH Assessment (Signed)
Reviewed ED notes prior to initiating assessment. Pt requesting help with etoh. Initially denies SI, but then said possible SI and hx of cutting.   Assessment to commence shortly.    Lear Ng, [facility] Triage Specialist 10/30/2014 7:35 PM

## 2014-10-30 NOTE — ED Notes (Signed)
Patient admits to [facility] without plan. Patient denies HI and AVH at this time. Patient is pleasant and calm at this time. Patient reports " I'm tired of living like this". Plan of discussed wit patient. Patient voices no concerns or complaints at this time.  Patient given a meal. Encouragement and support provided and safety maintain. Q 15 min safety checks in place.

## 2014-10-30 NOTE — ED Provider Notes (Signed)
CSN: [id number]     Arrival date & time 10/30/14  1718 History   First MD Initiated Contact with Patient 10/30/14 1802     Chief Complaint  Patient presents with  . Alcohol Problem     (Consider location/radiation/quality/duration/timing/severity/associated sxs/prior Treatment) Patient is a 39 y.o. female presenting with alcohol problem. The history is provided by the patient.  Alcohol Problem Associated symptoms include abdominal pain. Pertinent negatives include no chest pain and no shortness of breath.   patient states she is suicidal. States she is depressed. Initially came for treatment of her alcohol use. She states she'll drink about a bottle of wine a day. States she last drank earlier today. She does have a container of wine in her purse. Initially told the nurse she was not suicidal but reported later was told she may not be admitted for the alcohol and then told the nurse she is suicidal. Previous history of depression and alcohol abuse. She states that she would cut herself.  Past Medical History  Diagnosis Date  . Proctitis   . Cysts of both ovaries   . Anemia   . Anxiety   . Blood transfusion without reported diagnosis   . Depression   . Fatty liver 10/05/13  . Cardiac arrest   . Seizures    Past Surgical History  Procedure Laterality Date  . Ovarian cyst removal    . Laparoscopy N/A 09/28/2013    Procedure: LAPAROSCOPY OPERATIVE;  Surgeon: Terrance Mass, MD;  Location: [facility];  Service: Gynecology;  Laterality: N/A;  . Laparoscopic appendectomy Right 09/28/2013    Procedure: APPENDECTOMY LAPAROSCOPIC;  Surgeon: Terrance Mass, MD;  Location: [facility];  Service: Gynecology;  Laterality: Right;  . Salpingoophorectomy Right 09/28/2013    Procedure: SALPINGO OOPHORECTOMY;  Surgeon: Terrance Mass, MD;  Location: [facility];  Service: Gynecology;  Laterality: Right;  . Colonoscopy N/A 09/30/2013    Procedure: COLONOSCOPY;  Surgeon: Lafayette Dragon, MD;  Location: [facility];   Service: Endoscopy;  Laterality: N/A;  . Esophagogastroduodenoscopy N/A 11/23/2013    Procedure: ESOPHAGOGASTRODUODENOSCOPY (EGD);  Surgeon: Jerene Bears, MD;  [facility];  Service: Endoscopy;  Laterality: N/A;  . Appendectomy    . Left and right heart catheterization with coronary angiogram N/A 02/23/2014    Procedure: LEFT AND RIGHT HEART CATHETERIZATION WITH CORONARY ANGIOGRAM;  Surgeon: Leonie Man, MD;  [facility] LAB;  Service: Cardiovascular;  Laterality: N/A;   Family History  Problem Relation Age of Onset  . Diabetes Mother   . Hyperlipidemia Mother   . Stroke Mother   . Diabetes Father    History  Substance Use Topics  . Smoking status: Never Smoker   . Smokeless tobacco: Never Used  . Alcohol Use: Yes   OB History    Gravida Para Term Preterm AB TAB SAB Ectopic Multiple Living   7 3   4  4   3      Review of Systems  Constitutional: Negative for activity change.  Respiratory: Negative for shortness of breath.   Cardiovascular: Negative for chest pain.  Gastrointestinal: Positive for abdominal pain.  Genitourinary: Negative for dysuria.  Musculoskeletal: Negative for back pain.  Skin: Negative for wound.  Neurological: Negative for seizures.  Psychiatric/Behavioral: Positive for suicidal ideas and dysphoric mood.      Allergies  Morphine and related; Tramadol; and Penicillins  Home Medications   Prior to Admission medications   Medication Sig Start Date End Date Taking? Authorizing  Provider  carvedilol (COREG) 3.125 MG tablet Take 0.5 tablets (1.5625 mg total) by mouth 2 (two) times daily with a meal. For high blood pressure 07/04/14  Yes Encarnacion Slates, NP  disulfiram (ANTABUSE) 250 MG tablet Take 1 tablet (250 mg total) by mouth daily. For alcoholism 07/04/14  Yes Encarnacion Slates, NP  folic acid (FOLVITE) 1 MG tablet Take 1 tablet (1 mg total) by mouth daily. For low folate 07/04/14  Yes Encarnacion Slates, NP  hydrOXYzine (ATARAX/VISTARIL) 25  MG tablet Take 1 tablet (25 mg total) by mouth every 6 (six) hours as needed for anxiety. For anxiety 07/04/14  Yes Encarnacion Slates, NP  ibuprofen (ADVIL,MOTRIN) 200 MG tablet Take 400 mg by mouth every 6 (six) hours as needed for moderate pain (pain).   Yes Historical Provider, MD  levETIRAcetam (KEPPRA) 1000 MG tablet Take 1 tablet (1,000 mg total) by mouth daily. For seizure activities 07/04/14  Yes Encarnacion Slates, NP  topiramate (TOPAMAX) 25 MG tablet Take 25 mg by mouth 2 (two) times daily.    Yes Historical Provider, MD  cephALEXin (KEFLEX) 500 MG capsule Take 1 capsule (500 mg total) by mouth every 8 (eight) hours. For infection Patient not taking: Reported on 07/27/2014 07/04/14   Encarnacion Slates, NP  citalopram (CELEXA) 20 MG tablet Take 1 tablet (20 mg total) by mouth daily. For depression Patient not taking: Reported on 08/14/2014 07/04/14   Encarnacion Slates, NP  mirtazapine (REMERON) 30 MG tablet Take 1 tablet (30 mg total) by mouth at bedtime. For depression/sleep Patient not taking: Reported on 10/30/2014 07/04/14   Encarnacion Slates, NP   BP 128/96 mmHg  Pulse 102  Temp(Src) 97.5 F (36.4 C) (Oral)  Resp 21  SpO2 97%  LMP 10/10/2014 Physical Exam  Constitutional: She appears well-developed and well-nourished.  HENT:  Head: Normocephalic.  Eyes: Pupils are equal, round, and reactive to light.  Cardiovascular:  Mild tachycardia  Pulmonary/Chest: Effort normal and breath sounds normal.  Abdominal: Soft.  Mild lower abdominal tenderness without rebound or guarding.  Musculoskeletal: She exhibits edema.  Neurological: She is alert.  Skin: Skin is warm.    ED Course  Procedures (including critical care time) Labs Review Labs Reviewed  ACETAMINOPHEN LEVEL - Abnormal; Notable for the following:    Acetaminophen (Tylenol), Serum <10.0 (*)    All other components within normal limits  CBC - Abnormal; Notable for the following:    WBC 3.4 (*)    RBC 3.41 (*)    Hemoglobin 11.4 (*)     HCT 35.2 (*)    MCV 103.2 (*)    All other components within normal limits  COMPREHENSIVE METABOLIC PANEL - Abnormal; Notable for the following:    Glucose, Bld 135 (*)    BUN <5 (*)    Creatinine, Ser 0.33 (*)    AST 303 (*)    ALT 47 (*)    Alkaline Phosphatase 181 (*)    All other components within normal limits  ETHANOL - Abnormal; Notable for the following:    Alcohol, Ethyl (B) 418 (*)    All other components within normal limits  SALICYLATE LEVEL  URINE RAPID DRUG SCREEN (HOSP PERFORMED)  URINALYSIS, ROUTINE W REFLEX MICROSCOPIC    Imaging Review No results found.   EKG Interpretation None      MDM   Final diagnoses:  Alcohol dependence with withdrawal with complication    Patient presents with depression and alcohol abuse. States  she needs to get off alcohol. Found at the level of 418. Lab work otherwise is reassuring, although LFTs are somewhat elevated, likely from alcohol. Patient was in the psych unit when she had some shaking of her right arm. She's had previous episodes of this in the past. They brought her emergently back to the recess room this likely was functional and not a true seizure. She has had multiple evaluations for this in the past and has been seen by neurology. She is on Keppra but this likely was not actual seizure-like activity. Immediately upon going to the recess room she woke up and was back at baseline. TTS is called and states that Frederico Hamman once the patient monitored in the ER for 24 hours, however in the morning she will be seen by the psychiatrist.    Davonna Belling, MD 10/30/14 2156

## 2014-10-30 NOTE — ED Notes (Signed)
Pt recently had divorce papers come through.  No family here. Pt has been drinking a bottle of wine a day.  No SI/HI.  Pt wanting admit to help with problem.
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
## 2014-10-30 NOTE — ED Notes (Signed)
Pt stated during triage that she was not SI.  Pt stated she brought her belongings with her d/t her admission.   When notified pt that we would do our best for her but that sometimes that we are not able to do admissions for alcohol.  Pt states she sometimes does have thoughts of hurting herself and does this..(points to arm and states she used knife to cut self - No injury noted)

## 2014-10-30 NOTE — ED Notes (Signed)
Patient belongings placed in locker #33 in [place].

## 2014-10-30 NOTE — ED Notes (Signed)
Patient brought over from [facility] for possible "seizure like activity", upon arrival to unit patient alert, slightly confused, no oral trauma, c/o chronic lower abdominal pain and right sided neck pain. Patient placed on monitor, seizure pads in placed, Dr. Alvino Chapel at bedside. No new orders at this time. Will continue to monitor.

## 2014-10-31 MED ORDER — CHLORDIAZEPOXIDE HCL 25 MG PO CAPS: ORAL_CAPSULE | ORAL | Status: DC

## 2014-10-31 MED ORDER — ONDANSETRON 8 MG PO TBDP
8.0000 mg | ORAL_TABLET | Freq: Once | ORAL | Status: AC
  Administered 2014-10-31: 8 mg via ORAL
  Filled 2014-10-31: qty 1

## 2014-10-31 NOTE — ED Notes (Addendum)
Pt requesting to speak MD and states " I just don't feel well enough to go home". MD Campos at bedside. MD Mastic speaking with patient's boyfriend on phone and discussinng his concerns per patient's request.  Pt still to be Discharged.

## 2014-10-31 NOTE — Discharge Instructions (Signed)
Alcohol Intoxication Alcohol intoxication occurs when the amount of alcohol that a person has consumed impairs his or her ability to mentally and physically function. Alcohol directly impairs the normal chemical activity of the brain. Drinking large amounts of alcohol can lead to changes in mental function and behavior, and it can cause many physical effects that can be harmful.  Alcohol intoxication can range in severity from mild to very severe. Various factors can affect the level of intoxication that occurs, such as the person's age, gender, weight, frequency of alcohol consumption, and the presence of other medical conditions (such as diabetes, seizures, or heart conditions). Dangerous levels of alcohol intoxication may occur when people drink large amounts of alcohol in a short period (binge drinking). Alcohol can also be especially dangerous when combined with certain prescription medicines or "recreational" drugs. SIGNS AND SYMPTOMS Some common signs and symptoms of mild alcohol intoxication include:  Loss of coordination.  Changes in mood and behavior.  Impaired judgment.  Slurred speech. As alcohol intoxication progresses to more severe levels, other signs and symptoms will appear. These may include:  Vomiting.  Confusion and impaired memory.  Slowed breathing.  Seizures.  Loss of consciousness. DIAGNOSIS  Your health care provider will take a medical history and perform a physical exam. You will be asked about the amount and type of alcohol you have consumed. Blood tests will be done to measure the concentration of alcohol in your blood. In many places, your blood alcohol level must be lower than 80 mg/dL (0.08%) to legally drive. However, many dangerous effects of alcohol can occur at much lower levels.  TREATMENT  People with alcohol intoxication often do not require treatment. Most of the effects of alcohol intoxication are temporary, and they go away as the alcohol naturally  leaves the body. Your health care provider will monitor your condition until you are stable enough to go home. Fluids are sometimes given through an IV access tube to help prevent dehydration.  HOME CARE INSTRUCTIONS  Do not drive after drinking alcohol.  Stay hydrated. Drink enough water and fluids to keep your urine clear or pale yellow. Avoid caffeine.   Only take over-the-counter or prescription medicines as directed by your health care provider.  SEEK MEDICAL CARE IF:   You have persistent vomiting.   You do not feel better after a few days.  You have frequent alcohol intoxication. Your health care provider can help determine if you should see a substance use treatment counselor. SEEK IMMEDIATE MEDICAL CARE IF:   You become shaky or tremble when you try to stop drinking.   You shake uncontrollably (seizure).   You throw up (vomit) blood. This may be bright red or may look like black coffee grounds.   You have blood in your stool. This may be bright red or may appear as a black, tarry, bad smelling stool.   You become lightheaded or faint.  MAKE SURE YOU:   Understand these instructions.  Will watch your condition.  Will get help right away if you are not doing well or get worse. Document Released: 04/15/2005 Document Revised: 03/08/2013 Document Reviewed: 12/09/2012 [facility] Patient Information 2015 [place], [place]. This information is not intended to replace advice given to you by your health care provider. Make sure you discuss any questions you have with your health care provider.   Emergency Department Resource Guide 1) Find a Doctor and Pay Out of Pocket Although you won't have to find out who is covered by your  insurance plan, it is a good idea to ask around and get recommendations. You will then need to call the office and see if the doctor you have chosen will accept you as a new patient and what types of options they offer for patients who are self-pay.  Some doctors offer discounts or will set up payment plans for their patients who do not have insurance, but you will need to ask so you aren't surprised when you get to your appointment.  2) Contact Your Local Health Department Not all health departments have doctors that can see patients for sick visits, but many do, so it is worth a call to see if yours does. If you don't know where your local health department is, you can check in your phone book. The CDC also has a tool to help you locate your state's health department, and many state websites also have listings of all of their local health departments.  3) Find a [facility] If your illness is not likely to be very severe or complicated, you may want to try a walk in clinic. These are popping up all over the country in pharmacies, drugstores, and shopping centers. They're usually staffed by nurse practitioners or physician assistants that have been trained to treat common illnesses and complaints. They're usually fairly quick and inexpensive. However, if you have serious medical issues or chronic medical problems, these are probably not your best option.  No Primary Care Doctor: - Call Health Connect at  [phone] - they can help you locate a primary care doctor that  accepts your insurance, provides certain services, etc. - Physician Referral Service- [phone]  Chronic Pain Problems: Organization         Address  Phone   Notes  [facility]  [phone] Patients need to be referred by their primary care doctor.   Medication Assistance: Organization         Address  Phone   Notes  [facility] Medication [facility] [place] [place], [place] [address] [phone] --Must be a resident of [facility] -- Must have NO insurance coverage whatsoever (no Medicaid/ Medicare, etc.) -- The pt. MUST have a primary care doctor that directs their care regularly and follows them in the  community   MedAssist  [phone]   [employer]  [phone]    Agencies that provide inexpensive medical care: Organization         Address  Phone   Notes  [place]  [phone]   [facility]    [phone]   [facility] [place], [place] [address] [phone]   [place] [address], [place] [phone]   Planned Parenthood    [phone]   [facility]    [phone]   [place] and [place], [place] Phone:  [phone], Fax:  [phone] Hours of Operation:  9 am - 6 pm, M-F.  Also accepts Medicaid/Medicare and self-pay.  [facility] for [place], [address], [place] Phone: [phone], Fax: [phone]. Hours of Operation:  8:30 am - 5:30 pm, M-F.  Also accepts Medicaid and self-pay.  [facility] [place] [address] [place] Phone: [phone]   [place], [place], [place] [phone], Ext. [phone] Mondays &  Thursdays: 7-9 AM.  First 15 patients are seen on a first come, first serve basis.    [place] Providers:  Organization         Address  Phone   Notes  [employer] [address],  [phone] Also accepts self-pay patients.  [facility] [address], [place]  [phone]   [place], [address], [place] [phone]   [facility] [address] [place] [phone]   Lucianne Lei [address], [place]   [phone] Only accepts [place] Access [place] patients after they have their name applied to their card.   Self-Pay (no insurance) in [facility]:  Organization         Address  Phone   Notes  Sickle Cell Patients, [facility]  Internal Medicine [place] [phone]   [facility] [place] [phone]   [facility] [place]  [place], [place], [place] [phone]   [facility]/Dr. Osei-Bonsu  [address] [place] or [address], [place] [phone] Phone number for both [place] and [place] locations is the same.  [facility] [address], [place] [phone]   [facility] [address] [place] or [address] [phone]   [facility] [address] [place] [phone], phone; [phone], fax Sees patients 1st and 3rd Saturday of every month.  Must not qualify for public or private insurance (i.e. Medicaid, Medicare, [place] Health Choice, Veterans' Benefits)  Household income should be no more than 200% of the poverty level The clinic cannot treat you if you are pregnant or think you are pregnant  Sexually transmitted diseases are not treated at the clinic.    Dental Care: Organization         Address  Phone  Notes  [facility] Department of [facility] [place] [phone] Accepts children up to age 66 who are enrolled in [place] or [place]; pregnant women with a Medicaid card; and children who have applied for Medicaid or [place] Health Choice, but were declined, whose parents can pay a reduced fee at time of service.  [facility] Department of [facility]  [address], [place] [phone] Accepts children up to age 38 who are enrolled in [place] or [place]; pregnant women with a Medicaid card; and children who have applied for Medicaid or [place] Health Choice, but were declined, whose parents can pay a reduced fee at time of service.  [place] Adult Dental Access PROGRAM  [place] [phone] Patients are seen by appointment only. Walk-ins are not accepted. [place] will see patients 41 years of age and older. Monday - Tuesday (8am-5pm) Most Wednesdays (8:30-5pm) $30 per visit, cash only  [facility] Adult Dental Access PROGRAM  [address], [facility] [place] [phone] Patients are seen by appointment only. Walk-ins are not accepted. [place] will see patients 62 years of age and older. One Wednesday Evening (Monthly: Volunteer Based).  $30 per visit, cash only  [place]  [phone] for adults; Children under  age 79, call Graduate Pediatric Dentistry at [phone]. Children aged 27-14, please call [phone] to request a pediatric application.  Dental services are provided in all areas of dental care including fillings, crowns and bridges, complete and partial dentures, implants, gum treatment, root canals, and extractions. Preventive care is also provided. Treatment is provided to both adults and children. Patients are selected via a lottery and there is often a waiting list.   [facility] [address] [place]  [phone] [email or web address]   [facility] [address] [place], [place] [phone], Ext. [phone] Second and Fourth Thursday of each month, opens at 6:30 AM; Clinic ends at 9 AM.  Patients are seen on a first-come first-served basis, and a limited number are seen during each clinic.   [facility]  [address] Hillard Danker [place], [place] [phone]   Eligibility Requirements You must have lived in [place], [place], or [place] counties for at least the last three months.   You cannot be eligible for state or federal sponsored [employer], including [employer], [place], or [employer].   You generally cannot be eligible for healthcare insurance through your employer.    How to apply: Eligibility screenings are held every  Tuesday and Wednesday afternoon from 1:00 pm until 4:00 pm. You do not need an appointment for the interview!  [facility] [address], [place], [place]   [place]  [place] Department  [place]  [phone]    Behavioral Health Resources in the Community: Intensive Outpatient Programs Organization         Address  Phone  Notes  [place]. [address], [place], [place] [phone]   [facility] Outpatient [address] [place], [place]   ADS: Alcohol & Drug Svcs [address] [place], [place]   [place] [address]  [place], [place] or [phone]   Substance Abuse Resources Organization         Address  Phone  Notes  Alcohol and Drug Services  [phone]   [place]  [phone]   [facility] [place]   Chinita Pester  [phone]   Residential & Outpatient Substance Abuse Program  [phone]   Psychological Services Organization         Address  Phone  Notes  [facility] [place]  [place]  [phone]   [place] [address], [place] or [phone]    Mobile Crisis Teams Organization         Address  Phone  Notes  [facility], Mobile Crisis Care Unit  [phone]   [facility]  [address] [place], [place]   [place] Levels [address], [place] [phone]    Self-Help/Support Groups Organization         Address  Phone             Notes  [place]. of [place] - variety of support groups  [place] Call for more information  Narcotics Anonymous (NA), [facility] [address], [employer] [place]  2 meetings at this location    Special educational needs teacher         Address  Phone  Notes  [facility] [phone]  [address],    [place]  [phone]   [facility]  [address] [place] [address]29, [place], [place]   [place], [place] [phone] Admissions: 8am-3pm M-F  Incentives Substance [place] [address]    [place], [place] [phone]   [facility] [address] [place], [place], [place]   [facility] [address],  [place], [place]   [facility] Programs - Intensive Outpatient [address], [place], [place]   [facility] ([place].) [place].,  [place], [place] [phone] or [phone]   [facility] Services (RTS) [address] [place], [place] Accepts Medicaid  [facility] [place] [address]  [place] [phone] Substance Abuse/Addiction Treatment   [facility] Organization         Address  Phone  Notes  [facility]  [phone]   Domenic Schwab, PhD [address] Arlis [place], [place]   [phone] or [phone]   [place], [place] [phone]   [facility] [address], [place], [place] [phone] Insurance/Medicaid/sponsorship through [facility] and Families [address] [place]                                    [place], [place] [phone] [place] [address], [place] [phone]    Dr. Adele Schilder  [phone]   [facility] Dept. 1) [address] [place] 2) [place] 3)  [place] [address], [place] [phone]  [phone]   [place] [phone] or [phone] (After  Hours)

## 2014-10-31 NOTE — ED Notes (Signed)
MD Campos at bedside.  

## 2014-10-31 NOTE — ED Notes (Addendum)
Pt reports that she does not want to leave. She wants to go to [employer]. She states " why are you sending me home if I'm suicidal?" Pt does not verbalize a plan to harm self and states " I drink because I hate life right now". Pt was given resources to follow up with and a script for librium.  MD [facility] aware of patient concern and wishes to go to [facility]. He encourages DC and has spoken with patient already about his plans and resources for patient. Pt also verbalizing nausea. This RN obtained an order for Zofran ODT. PT calling friend for a ride home she is concerned that she will be unable to transport her large suitcase with her and unsure if she has a way home. This RN offers a bus pass.

## 2014-10-31 NOTE — ED Notes (Signed)
Bus pass provided. Pt alert, oriented, and ambulatory with large suitcase and cell phone in hand.

## 2014-10-31 NOTE — ED Provider Notes (Signed)
Pt is calm and cooperative at this time. Alcohol dependence. Vitals are normal. No signs of withdrawal at this time. Depression without SI. Will dc home with resources at this time and librium taper. Outpatient follow [facility] at [facility] recommended. No indication for inpatient treatment necessary. Doubt ETOH withdrawal seizure. No apparent post ictal state per nursing staff    Jola Schmidt, MD 10/31/14 [phone]

## 2014-11-26 ENCOUNTER — Emergency Department (HOSPITAL_COMMUNITY)
Admission: EM | Admit: 2014-11-26 | Discharge: 2014-11-26 | Discharge status: Against Medical Advice | Payer: Self-pay | Attending: Emergency Medicine | Admitting: Emergency Medicine

## 2014-11-26 ENCOUNTER — Encounter (HOSPITAL_COMMUNITY): Payer: Self-pay | Admitting: Nurse Practitioner

## 2014-11-26 DIAGNOSIS — F329 Major depressive disorder, single episode, unspecified: Secondary | ICD-10-CM | POA: Insufficient documentation

## 2014-11-26 DIAGNOSIS — F1020 Alcohol dependence, uncomplicated: Secondary | ICD-10-CM

## 2014-11-26 DIAGNOSIS — Z8719 Personal history of other diseases of the digestive system: Secondary | ICD-10-CM | POA: Insufficient documentation

## 2014-11-26 DIAGNOSIS — Z8679 Personal history of other diseases of the circulatory system: Secondary | ICD-10-CM | POA: Insufficient documentation

## 2014-11-26 DIAGNOSIS — F419 Anxiety disorder, unspecified: Secondary | ICD-10-CM | POA: Insufficient documentation

## 2014-11-26 DIAGNOSIS — D649 Anemia, unspecified: Secondary | ICD-10-CM | POA: Insufficient documentation

## 2014-11-26 DIAGNOSIS — Z79899 Other long term (current) drug therapy: Secondary | ICD-10-CM | POA: Insufficient documentation

## 2014-11-26 DIAGNOSIS — G40909 Epilepsy, unspecified, not intractable, without status epilepticus: Secondary | ICD-10-CM | POA: Insufficient documentation

## 2014-11-26 DIAGNOSIS — F10129 Alcohol abuse with intoxication, unspecified: Secondary | ICD-10-CM | POA: Insufficient documentation

## 2014-11-26 NOTE — ED Provider Notes (Signed)
CSN: [id number]     Arrival date & time 11/26/14  1747 History   First MD Initiated Contact with Patient 11/26/14 1849     Chief Complaint  Patient presents with  . Alcohol Intoxication     (Consider location/radiation/quality/duration/timing/severity/associated sxs/prior Treatment) HPI Comments: Patient here after having reported history of seizures described by her family members as being brief episodes of shaking without a postictal phase. No reported loss of bladder function. No tongue trauma noted. Patient has been seen in multiple times for similar symptoms. She does not take antiepileptic medication at this time. She also misses drinking copious amount of alcohol which is her baseline. She also complains of having some back pain from having a muscle spasms from the seizures. When I attempted to examine her she was upset because I would not give her pain medication first. She is requesting to leave immediately and will sign out [place]. She has been discharged accompanied of her family and denies any suicidal or homicidal ideations or thoughts at this time.  Patient is a 39 y.o. female presenting with intoxication. The history is provided by the patient.  Alcohol Intoxication    Past Medical History  Diagnosis Date  . Proctitis   . Cysts of both ovaries   . Anemia   . Anxiety   . Blood transfusion without reported diagnosis   . Depression   . Fatty liver 10/05/13  . Cardiac arrest   . Seizures    Past Surgical History  Procedure Laterality Date  . Ovarian cyst removal    . Laparoscopy N/A 09/28/2013    Procedure: LAPAROSCOPY OPERATIVE;  Surgeon: Terrance Mass, MD;  Location: [facility];  Service: Gynecology;  Laterality: N/A;  . Laparoscopic appendectomy Right 09/28/2013    Procedure: APPENDECTOMY LAPAROSCOPIC;  Surgeon: Terrance Mass, MD;  Location: [facility];  Service: Gynecology;  Laterality: Right;  . Salpingoophorectomy Right 09/28/2013    Procedure: SALPINGO  OOPHORECTOMY;  Surgeon: Terrance Mass, MD;  Location: [facility];  Service: Gynecology;  Laterality: Right;  . Colonoscopy N/A 09/30/2013    Procedure: COLONOSCOPY;  Surgeon: Lafayette Dragon, MD;  Location: [facility];  Service: Endoscopy;  Laterality: N/A;  . Esophagogastroduodenoscopy N/A 11/23/2013    Procedure: ESOPHAGOGASTRODUODENOSCOPY (EGD);  Surgeon: Jerene Bears, MD;  [facility];  Service: Endoscopy;  Laterality: N/A;  . Appendectomy    . Left and right heart catheterization with coronary angiogram N/A 02/23/2014    Procedure: LEFT AND RIGHT HEART CATHETERIZATION WITH CORONARY ANGIOGRAM;  Surgeon: Leonie Man, MD;  [facility];  Service: Cardiovascular;  Laterality: N/A;   Family History  Problem Relation Age of Onset  . Diabetes Mother   . Hyperlipidemia Mother   . Stroke Mother   . Diabetes Father    History  Substance Use Topics  . Smoking status: Never Smoker   . Smokeless tobacco: Never Used  . Alcohol Use: Yes   OB History    Gravida Para Term Preterm AB TAB SAB Ectopic Multiple Living   7 3   4  4   3      Review of Systems  Unable to perform ROS     Allergies  Morphine and related; Tramadol; and Penicillins  Home Medications   Prior to Admission medications   Medication Sig Start Date End Date Taking? Authorizing Provider  carvedilol (COREG) 3.125 MG tablet Take 0.5 tablets (1.5625 mg total) by mouth 2 (two) times daily with a meal.  For high blood pressure 07/04/14  Yes Encarnacion Slates, NP  citalopram (CELEXA) 20 MG tablet Take 1 tablet (20 mg total) by mouth daily. For depression 07/04/14  Yes Encarnacion Slates, NP  ibuprofen (ADVIL,MOTRIN) 200 MG tablet Take 400 mg by mouth every 6 (six) hours as needed for moderate pain (pain).   Yes Historical Provider, MD  cephALEXin (KEFLEX) 500 MG capsule Take 1 capsule (500 mg total) by mouth every 8 (eight) hours. For infection Patient not taking: Reported on 07/27/2014 07/04/14   Encarnacion Slates, NP   chlordiazePOXIDE (LIBRIUM) 25 MG capsule 50mg  PO TID x 1D, then 25-50mg  PO BID X 1D, then 25-50mg  PO QD X 1D Patient not taking: Reported on 11/26/2014 10/31/14   Jola Schmidt, MD  disulfiram (ANTABUSE) 250 MG tablet Take 1 tablet (250 mg total) by mouth daily. For alcoholism Patient not taking: Reported on 11/26/2014 07/04/14   Encarnacion Slates, NP  folic acid (FOLVITE) 1 MG tablet Take 1 tablet (1 mg total) by mouth daily. For low folate Patient not taking: Reported on 11/26/2014 07/04/14   Encarnacion Slates, NP  hydrOXYzine (ATARAX/VISTARIL) 25 MG tablet Take 1 tablet (25 mg total) by mouth every 6 (six) hours as needed for anxiety. For anxiety Patient not taking: Reported on 11/26/2014 07/04/14   Encarnacion Slates, NP  levETIRAcetam (KEPPRA) 1000 MG tablet Take 1 tablet (1,000 mg total) by mouth daily. For seizure activities Patient not taking: Reported on 11/26/2014 07/04/14   Encarnacion Slates, NP  mirtazapine (REMERON) 30 MG tablet Take 1 tablet (30 mg total) by mouth at bedtime. For depression/sleep Patient not taking: Reported on 10/30/2014 07/04/14   Encarnacion Slates, NP   BP 108/66 mmHg  Pulse 86  Temp(Src) 98 F (36.7 C)  Resp 15  SpO2 99%  LMP 10/10/2014 Physical Exam  Constitutional: She is oriented to person, place, and time. She appears well-developed and well-nourished.  Non-toxic appearance. No distress.  HENT:  Head: Normocephalic and atraumatic.  Eyes: Conjunctivae, EOM and lids are normal. Pupils are equal, round, and reactive to light.  Neck: Normal range of motion. Neck supple. No tracheal deviation present. No thyroid mass present.  Cardiovascular: Normal rate, regular rhythm and normal heart sounds.  Exam reveals no gallop.   No murmur heard. Pulmonary/Chest: Effort normal and breath sounds normal. No stridor. No respiratory distress. She has no decreased breath sounds. She has no wheezes. She has no rhonchi. She has no rales.  Abdominal: Soft. Normal appearance and bowel sounds are normal.  She exhibits no distension. There is no tenderness. There is no rebound and no CVA tenderness.  Musculoskeletal: Normal range of motion. She exhibits no edema or tenderness.  Neurological: She is alert and oriented to person, place, and time. She has normal strength. No cranial nerve deficit or sensory deficit. GCS eye subscore is 4. GCS verbal subscore is 5. GCS motor subscore is 6.  Skin: Skin is warm and dry. No abrasion and no rash noted.  Psychiatric: Her mood appears anxious. Her speech is slurred. She is aggressive.  Nursing note and vitals reviewed.   ED Course  Procedures (including critical care time) Labs Review Labs Reviewed - No data to display  Imaging Review No results found.   EKG Interpretation None      MDM   Final diagnoses:  None    Patient is leaving [facility] at this time. She is being discharged home with her relatives  Lacretia Leigh, MD 11/26/14 [phone]

## 2014-11-26 NOTE — ED Notes (Addendum)
Pt reported that she has "walking seizure". She losses speech and rt arm goes limp. Pt reported that it takes 2 hrs for her speech to return. She stated that she had glasses of wine today. Pt reported having mid-back pain.

## 2014-11-26 NOTE — ED Notes (Signed)
Pt presents via EMS, report of alcohol intoxication (uknown amount) and pt stating she had 6 unwitnessed seizures.

## 2014-11-28 ENCOUNTER — Encounter (HOSPITAL_COMMUNITY): Payer: Self-pay | Admitting: *Deleted

## 2014-11-28 ENCOUNTER — Emergency Department (HOSPITAL_COMMUNITY)
Admission: EM | Admit: 2014-11-28 | Discharge: 2014-11-29 | Disposition: A | Discharge status: Home / Self Care | Payer: Self-pay | Attending: Emergency Medicine | Admitting: Emergency Medicine

## 2014-11-28 DIAGNOSIS — D649 Anemia, unspecified: Secondary | ICD-10-CM | POA: Insufficient documentation

## 2014-11-28 DIAGNOSIS — Y998 Other external cause status: Secondary | ICD-10-CM | POA: Insufficient documentation

## 2014-11-28 DIAGNOSIS — G40909 Epilepsy, unspecified, not intractable, without status epilepticus: Secondary | ICD-10-CM | POA: Insufficient documentation

## 2014-11-28 DIAGNOSIS — Z880 Allergy status to penicillin: Secondary | ICD-10-CM | POA: Insufficient documentation

## 2014-11-28 DIAGNOSIS — Z8719 Personal history of other diseases of the digestive system: Secondary | ICD-10-CM | POA: Insufficient documentation

## 2014-11-28 DIAGNOSIS — Z8679 Personal history of other diseases of the circulatory system: Secondary | ICD-10-CM | POA: Insufficient documentation

## 2014-11-28 DIAGNOSIS — Y9289 Other specified places as the place of occurrence of the external cause: Secondary | ICD-10-CM | POA: Insufficient documentation

## 2014-11-28 DIAGNOSIS — Y9389 Activity, other specified: Secondary | ICD-10-CM | POA: Insufficient documentation

## 2014-11-28 DIAGNOSIS — Z8742 Personal history of other diseases of the female genital tract: Secondary | ICD-10-CM | POA: Insufficient documentation

## 2014-11-28 DIAGNOSIS — F329 Major depressive disorder, single episode, unspecified: Secondary | ICD-10-CM | POA: Insufficient documentation

## 2014-11-28 DIAGNOSIS — F10230 Alcohol dependence with withdrawal, uncomplicated: Secondary | ICD-10-CM | POA: Diagnosis present

## 2014-11-28 DIAGNOSIS — X781XXA Intentional self-harm by knife, initial encounter: Secondary | ICD-10-CM | POA: Insufficient documentation

## 2014-11-28 DIAGNOSIS — F419 Anxiety disorder, unspecified: Secondary | ICD-10-CM | POA: Insufficient documentation

## 2014-11-28 DIAGNOSIS — F32A Depression, unspecified: Secondary | ICD-10-CM

## 2014-11-28 DIAGNOSIS — S60812A Abrasion of left wrist, initial encounter: Secondary | ICD-10-CM | POA: Insufficient documentation

## 2014-11-28 LAB — CBC
HCT: 37.1 % (ref 36.0–46.0)
Hemoglobin: 11.8 g/dL — ABNORMAL LOW (ref 12.0–15.0)
MCH: 30.4 pg (ref 26.0–34.0)
MCHC: 31.8 g/dL (ref 30.0–36.0)
MCV: 95.6 fL (ref 78.0–100.0)
PLATELETS: 194 10*3/uL (ref 150–400)
RBC: 3.88 MIL/uL (ref 3.87–5.11)
RDW: 15.1 % (ref 11.5–15.5)
WBC: 3.8 10*3/uL — ABNORMAL LOW (ref 4.0–10.5)

## 2014-11-28 LAB — COMPREHENSIVE METABOLIC PANEL
ALBUMIN: 3.9 g/dL (ref 3.5–5.0)
ALT: 48 U/L (ref 14–54)
AST: 108 U/L — AB (ref 15–41)
Alkaline Phosphatase: 131 U/L — ABNORMAL HIGH (ref 38–126)
Anion gap: 14 (ref 5–15)
BUN: <5 mg/dL — ABNORMAL LOW (ref 6–20)
CALCIUM: 8.3 mg/dL — AB (ref 8.9–10.3)
CO2: 25 mmol/L (ref 22–32)
CREATININE: 0.38 mg/dL — AB (ref 0.44–1.00)
Chloride: 103 mmol/L (ref 101–111)
GFR calc Af Amer: >60 mL/min (ref >60)
GFR calc non Af Amer: >60 mL/min (ref >60)
Glucose, Bld: 106 mg/dL — ABNORMAL HIGH (ref 70–99)
Potassium: 3.2 mmol/L — ABNORMAL LOW (ref 3.5–5.1)
Sodium: 142 mmol/L (ref 135–145)
Total Bilirubin: 0.6 mg/dL (ref 0.3–1.2)
Total Protein: 7.1 g/dL (ref 6.5–8.1)

## 2014-11-28 LAB — ETHANOL: Alcohol, Ethyl (B): 378 mg/dL (ref <5)

## 2014-11-28 LAB — SALICYLATE LEVEL

## 2014-11-28 MED ORDER — NICOTINE 21 MG/24HR TD PT24: 21.0000 mg | MEDICATED_PATCH | Freq: Every day | TRANSDERMAL | Status: DC

## 2014-11-28 MED ORDER — ALUM & MAG HYDROXIDE-SIMETH 200-200-20 MG/5ML PO SUSP: 30.0000 mL | ORAL | Status: DC | PRN

## 2014-11-28 MED ORDER — LORAZEPAM 1 MG PO TABS: 1.0000 mg | ORAL_TABLET | Freq: Three times a day (TID) | ORAL | Status: DC | PRN

## 2014-11-28 MED ORDER — ACETAMINOPHEN 325 MG PO TABS
650.0000 mg | ORAL_TABLET | ORAL | Status: DC | PRN
  Administered 2014-11-28: 650 mg via ORAL
  Filled 2014-11-28: qty 2

## 2014-11-28 MED ORDER — ONDANSETRON HCL 4 MG PO TABS: 4.0000 mg | ORAL_TABLET | Freq: Three times a day (TID) | ORAL | Status: DC | PRN

## 2014-11-28 MED ORDER — ZOLPIDEM TARTRATE 5 MG PO TABS
5.0000 mg | ORAL_TABLET | Freq: Every evening | ORAL | Status: DC | PRN
  Administered 2014-11-28: 5 mg via ORAL
  Filled 2014-11-28: qty 1

## 2014-11-28 NOTE — BH Assessment (Signed)
Reviewed ED notes prior to initiating assessment. Pt BIB under IVC. She was seen by this Probation officer about a month ago with extremely high BAL in 400s, today at 64. She took medication adn cut her wrist.    Assessment to commence shortly.    Lear Ng, [facility] Triage Specialist 11/28/2014 8:28 PM

## 2014-11-28 NOTE — ED Notes (Signed)
Pt attempted to void for urine sample.  Unsuccessful at this time

## 2014-11-28 NOTE — ED Notes (Signed)
Bed: [facility] Expected date: 11/28/14 Expected time: 11:02 PM Means of arrival:  Comments: Hold for Guthmiller, L.

## 2014-11-28 NOTE — ED Provider Notes (Addendum)
CSN: [id number]     Arrival date & time 11/28/14  1858 History   First MD Initiated Contact with Patient 11/28/14 1908     Chief Complaint  Patient presents with  . Medical Clearance     (Consider location/radiation/quality/duration/timing/severity/associated sxs/prior Treatment) Patient is a 39 y.o. female presenting with mental health disorder. The history is provided by the patient. No language interpreter was used.  Mental Health Problem Presenting symptoms: self mutilation and suicidal thoughts   Patient accompanied by:  Law enforcement Degree of incapacity (severity):  Moderate Onset quality:  Sudden Duration:  1 day Timing:  Constant Progression:  Worsening Chronicity:  New Relieved by:  Nothing Worsened by:  Nothing tried Ineffective treatments:  None tried Associated symptoms: poor judgment   Risk factors: no family hx of mental illness   Pt scratched wrist.   Pt told friend she was suicidal.  Pt here with police.  IVC.   Past Medical History  Diagnosis Date  . Proctitis   . Cysts of both ovaries   . Anemia   . Anxiety   . Blood transfusion without reported diagnosis   . Depression   . Fatty liver 10/05/13  . Cardiac arrest   . Seizures    Past Surgical History  Procedure Laterality Date  . Ovarian cyst removal    . Laparoscopy N/A 09/28/2013    Procedure: LAPAROSCOPY OPERATIVE;  Surgeon: Terrance Mass, MD;  Location: [facility];  Service: Gynecology;  Laterality: N/A;  . Laparoscopic appendectomy Right 09/28/2013    Procedure: APPENDECTOMY LAPAROSCOPIC;  Surgeon: Terrance Mass, MD;  Location: [facility];  Service: Gynecology;  Laterality: Right;  . Salpingoophorectomy Right 09/28/2013    Procedure: SALPINGO OOPHORECTOMY;  Surgeon: Terrance Mass, MD;  Location: [facility];  Service: Gynecology;  Laterality: Right;  . Colonoscopy N/A 09/30/2013    Procedure: COLONOSCOPY;  Surgeon: Lafayette Dragon, MD;  Location: [facility];  Service: Endoscopy;  Laterality: N/A;  .  Esophagogastroduodenoscopy N/A 11/23/2013    Procedure: ESOPHAGOGASTRODUODENOSCOPY (EGD);  Surgeon: Jerene Bears, MD;  Location: [facility];  Service: Endoscopy;  Laterality: N/A;  . Appendectomy    . Left and right heart catheterization with coronary angiogram N/A 02/23/2014    Procedure: LEFT AND RIGHT HEART CATHETERIZATION WITH CORONARY ANGIOGRAM;  Surgeon: Leonie Man, MD;  Location: [facility] CATH LAB;  Service: Cardiovascular;  Laterality: N/A;   Family History  Problem Relation Age of Onset  . Diabetes Mother   . Hyperlipidemia Mother   . Stroke Mother   . Diabetes Father    History  Substance Use Topics  . Smoking status: Never Smoker   . Smokeless tobacco: Never Used  . Alcohol Use: Yes   OB History    Gravida Para Term Preterm AB TAB SAB Ectopic Multiple Living   7 3   4  4   3      Review of Systems  Skin: Positive for wound.  Psychiatric/Behavioral: Positive for suicidal ideas and self-injury.  All other systems reviewed and are negative.     Allergies  Morphine and related; Tramadol; and Penicillins  Home Medications   Prior to Admission medications   Medication Sig Start Date End Date Taking? Authorizing Provider  citalopram (CELEXA) 20 MG tablet Take 1 tablet (20 mg total) by mouth daily. For depression 07/04/14  Yes Encarnacion Slates, NP  ibuprofen (ADVIL,MOTRIN) 200 MG tablet Take 400 mg by mouth every 6 (six) hours as needed for moderate pain (pain).  Yes Historical Provider, MD  levETIRAcetam (KEPPRA) 1000 MG tablet Take 1 tablet (1,000 mg total) by mouth daily. For seizure activities 07/04/14  Yes Encarnacion Slates, NP  carvedilol (COREG) 3.125 MG tablet Take 0.5 tablets (1.5625 mg total) by mouth 2 (two) times daily with a meal. For high blood pressure Patient not taking: Reported on 11/28/2014 07/04/14   Encarnacion Slates, NP  cephALEXin (KEFLEX) 500 MG capsule Take 1 capsule (500 mg total) by mouth every 8 (eight) hours. For infection Patient not taking: Reported on  07/27/2014 07/04/14   Encarnacion Slates, NP  chlordiazePOXIDE (LIBRIUM) 25 MG capsule 50mg  PO TID x 1D, then 25-50mg  PO BID X 1D, then 25-50mg  PO QD X 1D Patient not taking: Reported on 11/26/2014 10/31/14   Jola Schmidt, MD  disulfiram (ANTABUSE) 250 MG tablet Take 1 tablet (250 mg total) by mouth daily. For alcoholism Patient not taking: Reported on 11/26/2014 07/04/14   Encarnacion Slates, NP  folic acid (FOLVITE) 1 MG tablet Take 1 tablet (1 mg total) by mouth daily. For low folate Patient not taking: Reported on 11/26/2014 07/04/14   Encarnacion Slates, NP  hydrOXYzine (ATARAX/VISTARIL) 25 MG tablet Take 1 tablet (25 mg total) by mouth every 6 (six) hours as needed for anxiety. For anxiety Patient not taking: Reported on 11/26/2014 07/04/14   Encarnacion Slates, NP  mirtazapine (REMERON) 30 MG tablet Take 1 tablet (30 mg total) by mouth at bedtime. For depression/sleep Patient not taking: Reported on 10/30/2014 07/04/14   Encarnacion Slates, NP   BP 128/74 mmHg  Pulse 82  Temp(Src) 98.1 F (36.7 C) (Oral)  Resp 18  SpO2 97%  LMP 10/10/2014 Physical Exam  Constitutional: She is oriented to person, place, and time. She appears well-developed and well-nourished.  HENT:  Head: Normocephalic.  Eyes: EOM are normal.  Neck: Normal range of motion.  Pulmonary/Chest: Effort normal.  Abdominal: She exhibits no distension.  Musculoskeletal: Normal range of motion.  Neurological: She is alert and oriented to person, place, and time.  Skin:  Abrasions wrist  Psychiatric: She has a normal mood and affect.  Nursing note and vitals reviewed.   ED Course  Procedures (including critical care time) Labs Review Labs Reviewed  CBC - Abnormal; Notable for the following:    WBC 3.8 (*)    Hemoglobin 11.8 (*)    All other components within normal limits  COMPREHENSIVE METABOLIC PANEL - Abnormal; Notable for the following:    Potassium 3.2 (*)    Glucose, Bld 106 (*)    BUN <5 (*)    Creatinine, Ser 0.38 (*)    Calcium 8.3  (*)    AST 108 (*)    Alkaline Phosphatase 131 (*)    All other components within normal limits  ETHANOL - Abnormal; Notable for the following:    Alcohol, Ethyl (B) 378 (*)    All other components within normal limits  SALICYLATE LEVEL  URINE RAPID DRUG SCREEN (HOSP PERFORMED)    Imaging Review No results found.   EKG Interpretation None      MDM   Final diagnoses:  Abrasion of wrist, left, initial encounter  Depression    TTS advised Psychiatrist will see pt in the am.      Fransico Meadow, PA-C 11/28/14 2140  Dorie Rank, MD 11/28/14 2142  [place], PA-C 12/12/14 1532  Dorie Rank, MD 12/13/14 [phone]

## 2014-11-28 NOTE — ED Notes (Signed)
Patient appears calm, cooperative. Denies SI, HI, AVH. States anxiety 5/10, feelings of depression low. Reports that her medications are helping her. States earlier she had "an emotional outburst" but is feeling fine now. States she misses her kids in [place] and family in [place]. States that her sleep has been good in recent weeks. Reports fluctuating appetite. States she hasn't eaten in three days.   Reports that she has an appointment 5/12 at noon with [facility] that she hopes to make.  Encouragement offered. Given Tylenol, heating pad, and Ambien. Patient declined offer of a snack at present. Environment adjusted.  Q 15 safety checks in place.

## 2014-11-28 NOTE — ED Notes (Signed)
Bed: [facility] Expected date:  Expected time:  Means of arrival:  Comments: EMS SI/psueodo-seizure

## 2014-11-28 NOTE — ED Notes (Signed)
PA at the bedside, pt pulled her IV en route
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
## 2014-11-28 NOTE — ED Notes (Signed)
Pt arrives by [facility] with [employer] officers with her.  Pt called friend and told her that she was going to kill herself, minor cuts to bilateral wrists.  Pt also took "3 500mg  ibuprofen tabs"   Pt IVC, await paperwork.  Pt is calm and cooperative at this time.  Per ems "2 pseudo seizures en route"

## 2014-11-29 DIAGNOSIS — S60819A Abrasion of unspecified wrist, initial encounter: Secondary | ICD-10-CM | POA: Insufficient documentation

## 2014-11-29 DIAGNOSIS — Y908 Blood alcohol level of 240 mg/100 ml or more: Secondary | ICD-10-CM

## 2014-11-29 DIAGNOSIS — F10230 Alcohol dependence with withdrawal, uncomplicated: Secondary | ICD-10-CM

## 2014-11-29 NOTE — ED Notes (Signed)
Patient acuity low.

## 2014-11-29 NOTE — Progress Notes (Signed)
Per psychiatrist and NP, patient psychiatrically stable for discharge home. Pt plans to follow up with [facility] today at appointment at 12pm. No further Clinical Social Work needs, signing off.    [name], [place] Work  [employer] [phone]

## 2014-11-29 NOTE — Progress Notes (Signed)
Discharge note: Patient discharged home per MD order.  Patient is to follow up with [facility].  She denies SI/HI/AVH.  She will be transported by cab to her home.  She left ambulatory without incident.

## 2014-11-29 NOTE — Consult Note (Signed)
[facility] Consult   Reason for Consult:  Alcohol use disorder with uncomplicated withdrawal Referring Physician: EDP Patient Identification: Kristen Roberson MRN:  [id number] Principal Diagnosis: Alcohol dependence with uncomplicated withdrawal Diagnosis:   Patient Active Problem List   Diagnosis Date Noted  . Alcohol dependence with uncomplicated withdrawal [G62.694]     Priority: High  . GAD (generalized anxiety disorder) [F41.1] 07/01/2014  . Alcohol withdrawal syndrome without complication [W54.627] 03/50/0938  . MDD (major depressive disorder), recurrent episode, moderate [F33.1]   . Alcohol use disorder, severe, dependence [F10.20]   . Alcohol dependence with withdrawal with complication [H82.993] 71/69/6789  . Thrombocytopenia [D69.6] 05/07/2014  . Hypokalemia [E87.6] 05/07/2014  . Right sided weakness [M62.89] 04/15/2014  . Seizures [R56.9] 04/04/2014  . Substance induced mood disorder [F19.94] 03/26/2014  . Status post myocardial infarction [I25.2] 03/06/2014  . Depression [F32.9] 03/06/2014  . Substance abuse [F19.10] 02/22/2014  . Acute respiratory failure with hypoxia [J96.01] 02/22/2014  . Ventricular fibrillation [I49.01] 02/22/2014  . Acute systolic heart failure - s/p VF Cardiac Arrest [I50.21] 02/22/2014  . Acute encephalopathy [G93.40] 02/22/2014  . Acute confusional state [F05] 02/22/2014  . Moderate malnutrition [E44.0] 02/21/2014  . Convulsions/seizures [R56.9] 02/14/2014  . Cardiac arrest [I46.9] 02/13/2014  . Alcohol dependence [F10.20] 01/06/2014  . Severe alcohol use disorder [F10.99] 01/06/2014  . Adjustment disorder with depressed mood [F43.21] 12/14/2013  . 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 Folate deficiency [E53.8] 12/11/2013  . Alcoholism [F10.20] 12/11/2013  . Alcohol withdrawal [F10.239] 12/11/2013  . Malnutrition of moderate degree [E44.0] 12/10/2013  . Abdominal pain [R10.9] 11/25/2013  . Mallory-Weiss tear  [K22.6] 11/25/2013  . Acute blood loss anemia [D62] 11/24/2013  . Hematemesis [K92.0] 11/23/2013  . Fatty liver [K76.0] 10/29/2013  . Internal hemorrhoids with other complication [F81.0] 17/51/0258  . Hematochezia [K92.1] 10/28/2013  . Normocytic anemia [D64.9] 10/28/2013  . GIB (gastrointestinal bleeding) [K92.2] 10/28/2013  . Anxiety [F41.9]   . Post-operative state [Z98.89] 09/29/2013  . Postoperative state [Z98.89] 09/28/2013  . Female pelvic pain [R10.2] 09/21/2013  . Menorrhagia [N92.0] 09/21/2013  . History of ovarian cyst [Z87.42] 09/21/2013  . 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 Dysmenorrhea [N94.6] 09/21/2013    Total Time spent with patient: 1 hour  Subjective:   Kristen Roberson is a 39 y.o. female patient admitted with Alcohol use disorder, severe with uncomplicated intoxications.Marland Kitchen  HPI:  Caucasian female, 39 years old was evaluated for Alcohol intoxication and and voicing suicide.  Patient had Alcohol level of 378 on arrival last night.  She stated that she had an argument with her boyfriend , took some wine and 1500 mg of Ibuprofen.  She denied wanting to kill herself when she did that  Last night.  She called a friend of hers who called [employer]  Who brought her in.   She made superficial marks to her left writ but adamantly denied wanting to kill self.  She admitted to previous Alcohol problem and hospitalization for detox.  She was hospitalized at [facility] back in December.  Patient had 2 bouts of seizure on her way coming to the hospital and admitted to a diagnosis of seizures which she takes Keppra.  Patient reports that she has three children age 44 and 49 and 42 and stated that she cannot kill herself.  She plans to start a job next week.  Patient denied SI/HI/AVH and was discharged home.  HPI Elements:   Location:  Alcohol use disorder, severe, Suicidal ideation. Quality:  severe.  Severity:  severe. Timing:  acute. Duration:  Chronic Alcoholism, . Context:  Brought in after voicing  suicide.  Past Medical History:  Past Medical History  Diagnosis Date  . Proctitis   . Cysts of both ovaries   . Anemia   . Anxiety   . Blood transfusion without reported diagnosis   . Depression   . Fatty liver 10/05/13  . Cardiac arrest   . Seizures     Past Surgical History  Procedure Laterality Date  . Ovarian cyst removal    . Laparoscopy N/A 09/28/2013    Procedure: LAPAROSCOPY OPERATIVE;  Surgeon: Terrance Mass, MD;  Location: [facility];  Service: Gynecology;  Laterality: N/A;  . Laparoscopic appendectomy Right 09/28/2013    Procedure: APPENDECTOMY LAPAROSCOPIC;  Surgeon: Terrance Mass, MD;  Location: [facility];  Service: Gynecology;  Laterality: Right;  . Salpingoophorectomy Right 09/28/2013    Procedure: SALPINGO OOPHORECTOMY;  Surgeon: Terrance Mass, MD;  Location: [facility];  Service: Gynecology;  Laterality: Right;  . Colonoscopy N/A 09/30/2013    Procedure: COLONOSCOPY;  Surgeon: Lafayette Dragon, MD;  Location: [facility];  Service: Endoscopy;  Laterality: N/A;  . Esophagogastroduodenoscopy N/A 11/23/2013    Procedure: ESOPHAGOGASTRODUODENOSCOPY (EGD);  Surgeon: Jerene Bears, MD;  Location: [facility];  Service: Endoscopy;  Laterality: N/A;  . Appendectomy    . Left and right heart catheterization with coronary angiogram N/A 02/23/2014    Procedure: LEFT AND RIGHT HEART CATHETERIZATION WITH CORONARY ANGIOGRAM;  Surgeon: Leonie Man, MD;  Location: [facility] CATH LAB;  Service: Cardiovascular;  Laterality: N/A;   Family History:  Family History  Problem Relation Age of Onset  . Diabetes Mother   . Hyperlipidemia Mother   . Stroke Mother   . Diabetes Father    Social History:  History  Alcohol Use  . Yes     History  Drug Use No    History   Social History  . Marital Status: Legally Separated    Spouse Name: N/A  . Number of Children: N/A  . Years of Education: N/A   Social History Main Topics  . Smoking status: Never Smoker   . Smokeless tobacco: Never Used   . Alcohol Use: Yes  . Drug Use: No  . Sexual Activity: Not on file   Other Topics Concern  . None   Social History Narrative   Additional Social History:    Pain Medications: See PTA Prescriptions: SEE PTA reports she started an antidepressant while at [facility] as well as a sleeping medication, reports she has been taking as prescribed  Over the Counter: See PTA History of alcohol / drug use?: Yes Longest period of sobriety (when/how long): 8 months, hx of seizures, reports she has them constantly  Negative Consequences of Use: Personal relationships Withdrawal Symptoms:  (none reported at this time) Name of Substance 1: etoh BAL was 378 at arrival  1 - Age of First Use: 3 years ago  1 - Amount (size/oz): up to 1.5 bottles of wine 1 - Frequency: daily  1 - Duration: 3 years  1 - Last Use / Amount: today, 4 glasses of wine, reports she had been sober for 30+ as she went to [facility] after her last assessment, she reports she relapsed today after speaking with her children                   Allergies:   Allergies  Allergen Reactions  . Morphine And Related Anaphylaxis  Tolerated hydromorphone on 11/25/13.   . Tramadol Other (See Comments)    Seizures   . Penicillins Other (See Comments)    Unknown childhood reaction.    Labs:  Results for orders placed or performed during the hospital encounter of 11/28/14 (from the past 48 hour(s))  CBC     Status: Abnormal   Collection Time: 11/28/14  7:38 PM  Result Value Ref Range   WBC 3.8 (L) 4.0 - 10.5 K/uL   RBC 3.88 3.87 - 5.11 MIL/uL   Hemoglobin 11.8 (L) 12.0 - 15.0 g/dL   HCT 37.1 36.0 - 46.0 %   MCV 95.6 78.0 - 100.0 fL   MCH 30.4 26.0 - 34.0 pg   MCHC 31.8 30.0 - 36.0 g/dL   RDW 15.1 11.5 - 15.5 %   Platelets 194 150 - 400 K/uL  Comprehensive metabolic panel     Status: Abnormal   Collection Time: 11/28/14  7:38 PM  Result Value Ref Range   Sodium 142 135 - 145 mmol/L   Potassium 3.2 (L) 3.5 - 5.1 mmol/L    Chloride 103 101 - 111 mmol/L   CO2 25 22 - 32 mmol/L   Glucose, Bld 106 (H) 70 - 99 mg/dL   BUN <5 (L) 6 - 20 mg/dL   Creatinine, Ser 0.38 (L) 0.44 - 1.00 mg/dL   Calcium 8.3 (L) 8.9 - 10.3 mg/dL   Total Protein 7.1 6.5 - 8.1 g/dL   Albumin 3.9 3.5 - 5.0 g/dL   AST 108 (H) 15 - 41 U/L   ALT 48 14 - 54 U/L   Alkaline Phosphatase 131 (H) 38 - 126 U/L   Total Bilirubin 0.6 0.3 - 1.2 mg/dL   GFR calc non Af Amer >60 >60 mL/min   GFR calc Af Amer >60 >60 mL/min    Comment: (NOTE) The eGFR has been calculated using the CKD EPI equation. This calculation has not been validated in all clinical situations. eGFR's persistently <60 mL/min signify possible Chronic Kidney Disease.    Anion gap 14 5 - 15  Ethanol     Status: Abnormal   Collection Time: 11/28/14  7:38 PM  Result Value Ref Range   Alcohol, Ethyl (B) 378 (HH) <5 mg/dL    Comment:        LOWEST DETECTABLE LIMIT FOR SERUM ALCOHOL IS 11 mg/dL FOR MEDICAL PURPOSES ONLY CRITICAL RESULT CALLED TO, READ BACK BY AND VERIFIED WITH: Jordan Hawks RN 2011 40/10/27 A NAVARRO   Salicylate level     Status: None   Collection Time: 11/28/14  7:38 PM  Result Value Ref Range   Salicylate Lvl <2.5 2.8 - 30.0 mg/dL    Vitals: Blood pressure 110/79, pulse 88, temperature 97.8 F (36.6 C), temperature source Oral, resp. rate 18, last menstrual period 10/10/2014, SpO2 100 %.  Risk to Self: Suicidal Ideation: No-Not Currently/Within Last 6 Months (denies currently) Suicidal Intent: No Is patient at risk for suicide?: No Suicidal Plan?: No Access to Means: Yes Specify Access to Suicidal Means: pt used a kitchen knife to scratch X on her wrist, superficial  What has been your use of drugs/alcohol within the last 12 months?: Pt has been drinking heavily for the past three years, she had 30+ sober after going to [facility] and was released 4 days ago and then relapsed today after speaking with her kids How many times?: 1 (divorce) Other Self Harm  Risks: none Triggers for Past Attempts: Other (Comment) (divorce) Intentional Self Injurious Behavior:  Cutting Comment - Self Injurious Behavior: pt used kitchen knife to scratch wrists Risk to Others: Homicidal Ideation: No Thoughts of Harm to Others: No Current Homicidal Intent: No Current Homicidal Plan: No Access to Homicidal Means: No Identified Victim: none History of harm to others?: No Assessment of Violence: None Noted Violent Behavior Description: none Does patient have access to weapons?: No Criminal Charges Pending?: Yes Describe Pending Criminal Charges: for harrassment via telephone Does patient have a court date: Yes Court Date: 12/12/14 Prior Inpatient Therapy: Prior Inpatient Therapy: Yes Prior Therapy Dates: 06/30/14 and 05/19/14, 4/16 Prior Therapy Facilty/Provider(s): [facility]  Reason for Treatment: SA and depression  Prior Outpatient Therapy: Prior Outpatient Therapy: No Prior Therapy Dates: NA (but is scheduled to begin with [facility] ) Prior Therapy Facilty/Provider(s): NA Reason for Treatment: NA Does patient have an ACCT team?: No Does patient have Intensive In-House Services?  : No Does patient have [facility] services? : Yes Does patient have P4[facility] services?: No  No current facility-administered medications for this encounter.   Current Outpatient Prescriptions  Medication Sig Dispense Refill  . ibuprofen (ADVIL,MOTRIN) 200 MG tablet Take 400 mg by mouth every 6 (six) hours as needed for moderate pain (pain).    Marland Kitchen levETIRAcetam (KEPPRA) 1000 MG tablet Take 1 tablet (1,000 mg total) by mouth daily. For seizure activities 30 tablet 0  . carvedilol (COREG) 3.125 MG tablet Take 0.5 tablets (1.5625 mg total) by mouth 2 (two) times daily with a meal. For high blood pressure (Patient not taking: Reported on 11/28/2014)    . folic acid (FOLVITE) 1 MG tablet Take 1 tablet (1 mg total) by mouth daily. For low folate (Patient not taking: Reported on 11/26/2014)    .  mirtazapine (REMERON) 30 MG tablet Take 1 tablet (30 mg total) by mouth at bedtime. For depression/sleep (Patient not taking: Reported on 10/30/2014) 30 tablet 0   ROS is negative except for detailed PMH documentation.  Musculoskeletal: Strength & Muscle Tone: within normal limits Gait & Station: normal Patient leans: N/A  Psychiatric Specialty Exam:     Blood pressure 110/79, pulse 88, temperature 97.8 F (36.6 C), temperature source Oral, resp. rate 18, last menstrual period 10/10/2014, SpO2 100 %.There is no weight on file to calculate BMI.  General Appearance: Casual and Disheveled  Eye Contact::  Good  Speech:  Clear and Coherent and Normal Rate  Volume:  Normal  Mood:  Euthymic  Affect:  Congruent  Thought Process:  Coherent, Goal Directed and Intact  Orientation:  Full (Time, Place, and Person)  Thought Content:  WDL  Suicidal Thoughts:  No  Homicidal Thoughts:  No  Memory:  Immediate;   Good Recent;   Good Remote;   Good  Judgement:  Fair  Insight:  Fair  Psychomotor Activity:  Normal  Concentration:  Good  Recall:  NA  Fund of Knowledge:Good  Language: Good  Akathisia:  NA  Handed:  Right  AIMS (if indicated):     Assets:  Desire for Improvement  ADL's:  Intact  Cognition: WNL  Sleep:      Medical Decision Making: Established Problem, Stable/Improving (1)  Treatment Plan Summary: Plan Discharged home with outpatient resources  Plan:  discharged home, patient was treated with Ativan per ourAlcohol detox protocol Disposition: Discharge home.  Delfin Gant   PMHNP-BC 11/29/2014 6:14 PM Patient seen face-to-face for psychiatric evaluation, chart reviewed and case discussed with the physician extender and developed treatment plan. Reviewed the information documented and agree with  the treatment plan. Corena Pilgrim, MD

## 2014-11-29 NOTE — [facility] Suicide Risk Assessment (Cosign Needed)
Suicide Risk Assessment  Discharge Assessment   [facility] Discharge Suicide Risk Assessment   Demographic Factors:  Adolescent or young adult, Divorced or widowed, Caucasian, Low socioeconomic status and Living alone  Total Time spent with patient: 20 minutes  Musculoskeletal: Strength & Muscle Tone: within normal limits Gait & Station: normal Patient leans: N/A  Psychiatric Specialty Exam:     Blood pressure 104/60, pulse 92, temperature 98.2 F (36.8 C), temperature source Oral, resp. rate 20, last menstrual period 10/10/2014, SpO2 99 %.There is no weight on file to calculate BMI.  General Appearance: Casual and Disheveled  Eye Contact::  Good  Speech:  Clear and Coherent and Normal Rate409  Volume:  Normal  Mood:  Euthymic  Affect:  Congruent  Thought Process:  Coherent, Goal Directed and Intact  Orientation:  Full (Time, Place, and Person)  Thought Content:  WDL  Suicidal Thoughts:  No  Homicidal Thoughts:  No  Memory:  Immediate;   Good Recent;   Good Remote;   Good  Judgement:  Good  Insight:  Fair  Psychomotor Activity:  Normal  Concentration:  Fair  Recall:  NA  Fund of Knowledge:Good  Language: Good  Akathisia:  NA  Handed:  Right  AIMS (if indicated):     Assets:  Desire for Improvement  Sleep:     Cognition: WNL  ADL's:  Impaired      Has this patient used any form of tobacco in the last 30 days? (Cigarettes, Smokeless Tobacco, Cigars, and/or Pipes) Yes, A prescription for an FDA-approved tobacco cessation medication was offered at discharge and the patient refused  Mental Status Per Nursing Assessment::   On Admission:     Current Mental Status by Physician: NA  Loss Factors: NA  Historical Factors: NA  Risk Reduction Factors:   Responsible for children under 69 years of age, Religious beliefs about death and Positive therapeutic relationship  Continued Clinical Symptoms:  Depression:   Insomnia  Cognitive Features That Contribute To Risk:   Polarized thinking    Suicide Risk:  Minimal: No identifiable suicidal ideation.  Patients presenting with no risk factors but with morbid ruminations; may be classified as minimal risk based on the severity of the depressive symptoms  Principal Problem: <principal problem not specified> Discharge Diagnoses:  Patient Active Problem List   Diagnosis Date Noted  . Alcohol dependence with uncomplicated withdrawal [V78.469]   . GAD (generalized anxiety disorder) [F41.1] 07/01/2014  . Alcohol withdrawal syndrome without complication [G29.528] 41/32/4401  . MDD (major depressive disorder), recurrent episode, moderate [F33.1]   . Alcohol use disorder, severe, dependence [F10.20]   . Alcohol dependence with withdrawal with complication [U27.253] 66/44/0347  . Thrombocytopenia [D69.6] 05/07/2014  . Hypokalemia [E87.6] 05/07/2014  . Right sided weakness [M62.89] 04/15/2014  . Seizures [R56.9] 04/04/2014  . Substance induced mood disorder [F19.94] 03/26/2014  . Status post myocardial infarction [I25.2] 03/06/2014  . Depression [F32.9] 03/06/2014  . Substance abuse [F19.10] 02/22/2014  . Acute respiratory failure with hypoxia [J96.01] 02/22/2014  . Ventricular fibrillation [I49.01] 02/22/2014  . Acute systolic heart failure - s/p VF Cardiac Arrest [I50.21] 02/22/2014  . Acute encephalopathy [G93.40] 02/22/2014  . Acute confusional state [F05] 02/22/2014  . Moderate malnutrition [E44.0] 02/21/2014  . Convulsions/seizures [R56.9] 02/14/2014  . Cardiac arrest [I46.9] 02/13/2014  . Alcohol dependence [F10.20] 01/06/2014  . Severe alcohol use disorder [F10.99] 01/06/2014  . Adjustment disorder with depressed mood [F43.21] 12/14/2013  . 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 Folate deficiency [E53.8] 12/11/2013  .  Alcoholism [F10.20] 12/11/2013  . Alcohol withdrawal [F10.239] 12/11/2013  . Malnutrition of moderate degree [E44.0] 12/10/2013  . Abdominal pain [R10.9] 11/25/2013   . Mallory-Weiss tear [K22.6] 11/25/2013  . Acute blood loss anemia [D62] 11/24/2013  . Hematemesis [K92.0] 11/23/2013  . Fatty liver [K76.0] 10/29/2013  . Internal hemorrhoids with other complication [U76.5] 46/50/3546  . Hematochezia [K92.1] 10/28/2013  . Normocytic anemia [D64.9] 10/28/2013  . GIB (gastrointestinal bleeding) [K92.2] 10/28/2013  . Anxiety [F41.9]   . Post-operative state [Z98.89] 09/29/2013  . Postoperative state [Z98.89] 09/28/2013  . Female pelvic pain [R10.2] 09/21/2013  . Menorrhagia [N92.0] 09/21/2013  . History of ovarian cyst [Z87.42] 09/21/2013  . 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 Dysmenorrhea [N94.6] 09/21/2013    Follow-up Information    Follow up with [facility] On 11/29/2014.   Specialty:  Behavioral Health   Why:  12pm    Contact information:   [address] [place] [id number] [phone]       Plan Of Care/Follow-up recommendations:  Activity:  AS TOLERATED Diet:  Regular  Is patient on multiple antipsychotic therapies at discharge:  No   Has Patient had three or more failed trials of antipsychotic monotherapy by history:  No  Recommended Plan for Multiple Antipsychotic Therapies: NA    Noemy Hallmon C   PMHNP-BC 11/29/2014, 9:51 AM

## 2014-12-08 ENCOUNTER — Emergency Department (HOSPITAL_COMMUNITY): Payer: Self-pay

## 2014-12-08 ENCOUNTER — Emergency Department (HOSPITAL_COMMUNITY)
Admission: EM | Admit: 2014-12-08 | Discharge: 2014-12-09 | Disposition: A | Discharge status: Home / Self Care | Payer: Self-pay | Attending: Emergency Medicine | Admitting: Emergency Medicine

## 2014-12-08 ENCOUNTER — Encounter (HOSPITAL_COMMUNITY): Payer: Self-pay | Admitting: Emergency Medicine

## 2014-12-08 DIAGNOSIS — F419 Anxiety disorder, unspecified: Secondary | ICD-10-CM | POA: Insufficient documentation

## 2014-12-08 DIAGNOSIS — Z880 Allergy status to penicillin: Secondary | ICD-10-CM | POA: Insufficient documentation

## 2014-12-08 DIAGNOSIS — Z9889 Other specified postprocedural states: Secondary | ICD-10-CM | POA: Insufficient documentation

## 2014-12-08 DIAGNOSIS — Z8742 Personal history of other diseases of the female genital tract: Secondary | ICD-10-CM | POA: Insufficient documentation

## 2014-12-08 DIAGNOSIS — K529 Noninfective gastroenteritis and colitis, unspecified: Secondary | ICD-10-CM | POA: Insufficient documentation

## 2014-12-08 DIAGNOSIS — F329 Major depressive disorder, single episode, unspecified: Secondary | ICD-10-CM | POA: Insufficient documentation

## 2014-12-08 DIAGNOSIS — G40909 Epilepsy, unspecified, not intractable, without status epilepticus: Secondary | ICD-10-CM | POA: Insufficient documentation

## 2014-12-08 DIAGNOSIS — R112 Nausea with vomiting, unspecified: Secondary | ICD-10-CM

## 2014-12-08 DIAGNOSIS — K640 First degree hemorrhoids: Secondary | ICD-10-CM | POA: Insufficient documentation

## 2014-12-08 DIAGNOSIS — Z79899 Other long term (current) drug therapy: Secondary | ICD-10-CM | POA: Insufficient documentation

## 2014-12-08 DIAGNOSIS — Z8674 Personal history of sudden cardiac arrest: Secondary | ICD-10-CM | POA: Insufficient documentation

## 2014-12-08 DIAGNOSIS — D649 Anemia, unspecified: Secondary | ICD-10-CM | POA: Insufficient documentation

## 2014-12-08 LAB — COMPREHENSIVE METABOLIC PANEL
ALK PHOS: 141 U/L — AB (ref 38–126)
ALT: 19 U/L (ref 14–54)
AST: 75 U/L — AB (ref 15–41)
Albumin: 4.2 g/dL (ref 3.5–5.0)
Anion gap: 12 (ref 5–15)
BILIRUBIN TOTAL: 0.5 mg/dL (ref 0.3–1.2)
BUN: 6 mg/dL (ref 6–20)
CO2: 27 mmol/L (ref 22–32)
Calcium: 8.1 mg/dL — ABNORMAL LOW (ref 8.9–10.3)
Chloride: 102 mmol/L (ref 101–111)
Glucose, Bld: 108 mg/dL — ABNORMAL HIGH (ref 65–99)
Potassium: 2.8 mmol/L — ABNORMAL LOW (ref 3.5–5.1)
SODIUM: 141 mmol/L (ref 135–145)
Total Protein: 7.5 g/dL (ref 6.5–8.1)

## 2014-12-08 LAB — I-STAT BETA HCG BLOOD, ED (MC, WL, AP ONLY): I-stat hCG, quantitative: <5 m[IU]/mL (ref <5)

## 2014-12-08 LAB — CBC WITH DIFFERENTIAL/PLATELET
BASOS ABS: 0 10*3/uL (ref 0.0–0.1)
Basophils Relative: 1 % (ref 0–1)
EOS ABS: 0 10*3/uL (ref 0.0–0.7)
Eosinophils Relative: 0 % (ref 0–5)
HEMATOCRIT: 33.3 % — AB (ref 36.0–46.0)
Hemoglobin: 11 g/dL — ABNORMAL LOW (ref 12.0–15.0)
LYMPHS ABS: 1.3 10*3/uL (ref 0.7–4.0)
LYMPHS PCT: 38 % (ref 12–46)
MCH: 30.6 pg (ref 26.0–34.0)
MCHC: 33 g/dL (ref 30.0–36.0)
MCV: 92.5 fL (ref 78.0–100.0)
MONO ABS: 0.3 10*3/uL (ref 0.1–1.0)
Monocytes Relative: 8 % (ref 3–12)
NEUTROS ABS: 1.8 10*3/uL (ref 1.7–7.7)
Neutrophils Relative %: 53 % (ref 43–77)
PLATELETS: 130 10*3/uL — AB (ref 150–400)
RBC: 3.6 MIL/uL — ABNORMAL LOW (ref 3.87–5.11)
RDW: 15.8 % — ABNORMAL HIGH (ref 11.5–15.5)
WBC: 3.5 10*3/uL — ABNORMAL LOW (ref 4.0–10.5)

## 2014-12-08 LAB — POC OCCULT BLOOD, ED: FECAL OCCULT BLD: POSITIVE — AB

## 2014-12-08 LAB — LIPASE, BLOOD: Lipase: 30 U/L (ref 22–51)

## 2014-12-08 MED ORDER — ONDANSETRON HCL 4 MG/2ML IJ SOLN
4.0000 mg | Freq: Once | INTRAMUSCULAR | Status: AC
  Administered 2014-12-08: 4 mg via INTRAVENOUS
  Filled 2014-12-08: qty 2

## 2014-12-08 MED ORDER — IOHEXOL 300 MG/ML  SOLN
100.0000 mL | Freq: Once | INTRAMUSCULAR | Status: AC | PRN
  Administered 2014-12-08: 100 mL via INTRAVENOUS

## 2014-12-08 MED ORDER — POTASSIUM CHLORIDE 10 MEQ/100ML IV SOLN
10.0000 meq | INTRAVENOUS | Status: AC
  Administered 2014-12-08 (×2): 10 meq via INTRAVENOUS
  Filled 2014-12-08 (×2): qty 100

## 2014-12-08 MED ORDER — IOHEXOL 300 MG/ML  SOLN
25.0000 mL | Freq: Once | INTRAMUSCULAR | Status: AC | PRN
  Administered 2014-12-08: 25 mL via ORAL

## 2014-12-08 MED ORDER — SODIUM CHLORIDE 0.9 % IV BOLUS (SEPSIS)
1000.0000 mL | Freq: Once | INTRAVENOUS | Status: AC
  Administered 2014-12-08: 1000 mL via INTRAVENOUS

## 2014-12-08 NOTE — ED Notes (Signed)
Bed: WA02 Expected date: 12/08/14 Expected time: 6:00 PM Means of arrival: Ambulance Comments: Rectal bleed

## 2014-12-08 NOTE — ED Notes (Signed)
Pt BIB EMS. Pt c/o GI bleed with dark blood and clots x 3 days. EMS noted scant amount of blood. Pt also states she has lower abdominal pain. Describes as sharp/stabbing. Pt denies ETOH use, but has strong odor of ETOH on breath per EMS. Pt was able to walk to EMS gurney without difficulty per EMS.

## 2014-12-08 NOTE — ED Notes (Signed)
Awaiting pump and channel. Delay in Potassium infusion until equipment arrives. Will start immediately upon arrival

## 2014-12-08 NOTE — ED Provider Notes (Signed)
CSN: [id number]     Arrival date & time 12/08/14  1806 History   First MD Initiated Contact with Patient 12/08/14 1808     Chief Complaint  Patient presents with  . GI Bleeding   Kristen Roberson is a 39 y.o. 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 She also complains of bilateral lower abdominal pain for 3 days. She reports her last BM was 2 hours ago and was very painful and with bright red blood. She reports she last drank alcohol today. Her LMP was 2 weeks ago and was normal. She reports vomiting yesterday but none today. She denies current nausea. She denies She denies receptive anal intercourse or inserting objects into her rectum. She denies melena. She has taken nothing for treatment today. The patient denies fevers, chills, hematemesis, nausea, vaginal bleeding, vaginal discharge, urinary symptoms, hematuria, lightheadedness, dizziness, shortness of breath, or rashes.   (Consider location/radiation/quality/duration/timing/severity/associated sxs/prior Treatment) HPI  Past Medical History  Diagnosis Date  . Proctitis   . Cysts of both ovaries   . Anemia   . Anxiety   . Blood transfusion without reported diagnosis   . Depression   . Fatty liver 10/05/13  . Cardiac arrest   . Seizures    Past Surgical History  Procedure Laterality Date  . Ovarian cyst removal    . Laparoscopy N/A 09/28/2013    Procedure: LAPAROSCOPY OPERATIVE;  Surgeon: Terrance Mass, MD;  Location: [facility];  Service: Gynecology;  Laterality: N/A;  . Laparoscopic appendectomy Right 09/28/2013    Procedure: APPENDECTOMY LAPAROSCOPIC;  Surgeon: Terrance Mass, MD;  Location: [facility];  Service: Gynecology;  Laterality: Right;  . Salpingoophorectomy Right 09/28/2013    Procedure: SALPINGO OOPHORECTOMY;  Surgeon: Terrance Mass, MD;  Location: [facility];   Service: Gynecology;  Laterality: Right;  . Colonoscopy N/A 09/30/2013    Procedure: COLONOSCOPY;  Surgeon: Lafayette Dragon, MD;  Location: [facility];  Service: Endoscopy;  Laterality: N/A;  . Esophagogastroduodenoscopy N/A 11/23/2013    Procedure: ESOPHAGOGASTRODUODENOSCOPY (EGD);  Surgeon: Jerene Bears, MD;  Location: [facility];  Service: Endoscopy;  Laterality: N/A;  . Appendectomy    . Left and right heart catheterization with coronary angiogram N/A 02/23/2014    Procedure: LEFT AND RIGHT HEART CATHETERIZATION WITH CORONARY ANGIOGRAM;  Surgeon: Leonie Man, MD;  Location: [facility];  Service: Cardiovascular;  Laterality: N/A;   Family History  Problem Relation Age of Onset  . Diabetes Mother   . Hyperlipidemia Mother   . Stroke Mother   . Diabetes Father    History  Substance Use Topics  . Smoking status: Never Smoker   . Smokeless tobacco: Never Used  . Alcohol Use: Yes   OB History    Gravida Para Term Preterm AB TAB SAB Ectopic Multiple Living   7 3   4  4   3      Review of Systems  Constitutional: Negative for fever and chills.  HENT: Negative for congestion and sore throat.   Eyes: Negative for visual disturbance.  Respiratory: Negative for cough and shortness of breath.   Cardiovascular: Negative for chest pain and palpitations.  Gastrointestinal: Positive for blood in stool, anal bleeding and rectal pain. Negative for nausea, vomiting and abdominal pain.  Genitourinary: Negative for dysuria, urgency, frequency, hematuria, vaginal bleeding, vaginal  discharge and difficulty urinating.  Musculoskeletal: Negative for back pain and neck pain.  Skin: Negative for rash.  Neurological: Negative for dizziness, weakness, light-headedness and headaches.      Allergies  Morphine and related; Tramadol; and Penicillins  Home Medications   Prior to Admission medications   Medication Sig Start Date End Date Taking? Authorizing Provider  ibuprofen (ADVIL,MOTRIN) 200 MG  tablet Take 400 mg by mouth every 6 (six) hours as needed for moderate pain (pain).   Yes Historical Provider, MD  levETIRAcetam (KEPPRA) 1000 MG tablet Take 1 tablet (1,000 mg total) by mouth daily. For seizure activities Patient taking differently: Take 1,000 mg by mouth 2 (two) times daily. For seizure activities 07/04/14  Yes Encarnacion Slates, NP  mirtazapine (REMERON) 30 MG tablet Take 1 tablet (30 mg total) by mouth at bedtime. For depression/sleep 07/04/14  Yes Encarnacion Slates, NP  carvedilol (COREG) 3.125 MG tablet Take 0.5 tablets (1.5625 mg total) by mouth 2 (two) times daily with a meal. For high blood pressure Patient not taking: Reported on 11/28/2014 07/04/14   Encarnacion Slates, NP  ciprofloxacin (CIPRO) 500 MG tablet Take 1 tablet (500 mg total) by mouth every 12 (twelve) hours. 12/09/14   Waynetta Pean, PA-C  dicyclomine (BENTYL) 20 MG tablet Take 1 tablet (20 mg total) by mouth 2 (two) times daily. 12/09/14   Waynetta Pean, PA-C  folic acid (FOLVITE) 1 MG tablet Take 1 tablet (1 mg total) by mouth daily. For low folate Patient not taking: Reported on 11/26/2014 07/04/14   Encarnacion Slates, NP  hydrocortisone-pramoxine (PROCTOFOAM [facility]) rectal foam Place 1 applicator rectally 2 (two) times daily. 12/09/14   Waynetta Pean, PA-C  metroNIDAZOLE (FLAGYL) 500 MG tablet Take 1 tablet (500 mg total) by mouth 2 (two) times daily. 12/09/14   Waynetta Pean, PA-C  ondansetron (ZOFRAN ODT) 4 MG disintegrating tablet Take 1 tablet (4 mg total) by mouth every 8 (eight) hours as needed for nausea or vomiting. 12/09/14   Waynetta Pean, PA-C   BP 124/81 mmHg  Pulse 78  Temp(Src) 98.4 F (36.9 C) (Oral)  Resp 16  SpO2 99%  LMP 11/18/2014 Physical Exam  Constitutional: She appears well-developed and well-nourished. No distress.  Nontoxic-appearing.  HENT:  Head: Normocephalic and atraumatic.  Mouth/Throat: Oropharynx is clear and moist. No oropharyngeal exudate.  Eyes: Conjunctivae are normal. Pupils are  equal, round, and reactive to light. Right eye exhibits no discharge. Left eye exhibits no discharge.  Neck: Neck supple. No JVD present.  Cardiovascular: Normal rate, regular rhythm, normal heart sounds and intact distal pulses.  Exam reveals no gallop and no friction rub.   No murmur heard. Pulmonary/Chest: Effort normal and breath sounds normal. No respiratory distress. She has no wheezes. She has no rales.  Abdominal: Soft. Bowel sounds are normal. She exhibits no distension and no mass. There is tenderness.  Abdomen is soft. Bowel sounds are present. Diffuse abdominal tenderness without focal tenderness. Negative psoas and obturator sign.  Genitourinary:  Digital rectal exam performed by me with female nurse chaperone. Patient has several large external hemorrhoids. No gross bloody stool. No evidence of thrombosed hemorrhoids.   Musculoskeletal: She exhibits no edema.  Lymphadenopathy:    She has no cervical adenopathy.  Neurological: She is alert. Coordination normal.  Skin: Skin is warm and dry. No rash noted. She is not diaphoretic. No erythema. No pallor.  Psychiatric: She has a normal mood and affect. Her behavior is normal.  Nursing note and  vitals reviewed.   ED Course  Procedures (including critical care time) Labs Review Labs Reviewed  COMPREHENSIVE METABOLIC PANEL - Abnormal; Notable for the following:    Potassium 2.8 (*)    Glucose, Bld 108 (*)    Creatinine, Ser <0.30 (*)    Calcium 8.1 (*)    AST 75 (*)    Alkaline Phosphatase 141 (*)    All other components within normal limits  CBC WITH DIFFERENTIAL/PLATELET - Abnormal; Notable for the following:    WBC 3.5 (*)    RBC 3.60 (*)    Hemoglobin 11.0 (*)    HCT 33.3 (*)    RDW 15.8 (*)    Platelets 130 (*)    All other components within normal limits  POC OCCULT BLOOD, ED - Abnormal; Notable for the following:    Fecal Occult Bld POSITIVE (*)    All other components within normal limits  LIPASE, BLOOD  CBG  MONITORING, ED  I-STAT BETA HCG BLOOD, ED ([facility], [facility], [facility] ONLY)    Imaging Review Ct Abdomen Pelvis W Contrast  12/08/2014   CLINICAL DATA:  GI bleeding with diffuse abdominal pain.  EXAM: CT ABDOMEN AND PELVIS WITH CONTRAST  TECHNIQUE: Multidetector CT imaging of the abdomen and pelvis was performed using the standard protocol following bolus administration of intravenous contrast.  CONTRAST:  72mL OMNIPAQUE IOHEXOL 300 MG/ML SOLN, 135mL OMNIPAQUE IOHEXOL 300 MG/ML SOLN  COMPARISON:  Multiple prior CT, most recently 05/28/2014  FINDINGS: The included lung bases are clear.  Diffusely decreased hepatic density, no evident focal hepatic lesion. The gallbladder is physiologically distended. The spleen, pancreas, and adrenal glands are normal. Symmetric renal enhancement without hydronephrosis or perinephric stranding.  The stomach is decompressed without gastric wall thickening. There are no dilated or thickened small bowel loops. There is mild diffuse colonic wall thickening, with near pan colonic involvement. Mild perienteric inflammatory change noted about the distal colon. There is no perforation, abscess, or intra-abdominal fluid collection. No intra-abdominal ascites. The appendix is not seen.  No retroperitoneal adenopathy. Abdominal aorta is normal in caliber. Serpiginous left retroperitoneum vascularity, unchanged.  Within the pelvis the uterus is normal for age. Bladder is physiologically distended. There is a 2.8 cm left ovarian cyst, likely physiologic. Right ovary is not definitively identified. Small amount of free fluid in the pelvis.  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 There are no acute or suspicious osseous abnormalities.  IMPRESSION: 1. Mild diffuse colonic wall thickening consistent with near pan colitis. This is likely infectious or inflammatory. 2. Severely decreased hepatic density, progressed from prior exam. Findings may  reflect hepatic steatosis or other chronic liver disease.   Electronically Signed   By: Jeb Levering M.D.   On: 12/08/2014 23:51     EKG Interpretation None      Filed Vitals:   12/08/14 1828 12/08/14 2151 12/08/14 2350 12/09/14 0021  BP: 118/68 120/77  124/81  Pulse: 79 66  78  Temp: 98.4 F (36.9 C)     TempSrc: Oral     Resp: 17 16 17 16   SpO2: 98% 99%  99%     MDM   Meds given in ED:  Medications  ondansetron (ZOFRAN) injection 4 mg (4 mg Intravenous Given 12/08/14 2001)  potassium chloride 10 mEq in 100 mL IVPB (0 mEq Intravenous Stopped 12/09/14 0021)  sodium chloride 0.9 % bolus 1,000 mL (0 mLs Intravenous Stopped 12/09/14 0021)  iohexol (OMNIPAQUE) 300 MG/ML  solution 100 mL (100 mLs Intravenous Contrast Given 12/08/14 2252)  iohexol (OMNIPAQUE) 300 MG/ML solution 25 mL (25 mLs Oral Contrast Given 12/08/14 2251)    New Prescriptions   CIPROFLOXACIN (CIPRO) 500 MG TABLET    Take 1 tablet (500 mg total) by mouth every 12 (twelve) hours.   DICYCLOMINE (BENTYL) 20 MG TABLET    Take 1 tablet (20 mg total) by mouth 2 (two) times daily.   HYDROCORTISONE-PRAMOXINE (PROCTOFOAM HC) RECTAL FOAM    Place 1 applicator rectally 2 (two) times daily.   METRONIDAZOLE (FLAGYL) 500 MG TABLET    Take 1 tablet (500 mg total) by mouth 2 (two) times daily.   ONDANSETRON (ZOFRAN ODT) 4 MG DISINTEGRATING TABLET    Take 1 tablet (4 mg total) by mouth every 8 (eight) hours as needed for nausea or vomiting.    Final diagnoses:  Colitis  First degree hemorrhoids  Non-intractable vomiting with nausea, vomiting of unspecified type   This is a 39 y.o. 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 She also complains of bilateral lower abdominal pain for 3 days. She reports her last BM was 2 hours ago and was very painful and with bright  red blood. On exam the patient is afebrile and non-toxic appearing. Patient's abdomen is soft without peritoneal signs. She has diffuse abdominal tenderness. On digital rectal examination has multiple external hemorrhoids with no evidence of thrombosed hemorrhoids. No gross bloody stool on exam. The patient has a negative I set hCG. Her CBC indicates a hemoglobin of 11.0 which is around her baseline. Her CMP indicated a potassium of 2.8 which is replaced with 2 potassium IVPB. She has a normal lipase. When her Hemoccult was positive which is to be expected with hemorrhoids on exam. Patient's CT abdomen pelvis with contrast indicated mild diffuse colonic wall thickening consistent with near pan colitis. Will treat with Cipro and Flagyl. I provided patient prescriptions for Zofran and Proctofoam for her hemorrhoids. I reevaluation the patient reports feeling better and has tolerated oral fluids in the ED. She denies any current nausea. She reports feeling ready for discharge. Will discharge with follow up with PCP and GI. I advised the patient to follow-up with their primary care provider this week. I advised the patient to return to the emergency department with new or worsening symptoms or new concerns. The patient verbalized understanding and agreement with plan.    This patient was discussed with Dr. Zenia Resides who agrees with assessment and plan.     Waynetta Pean, PA-C 12/09/14 0938  Lacretia Leigh, MD 12/11/14 0730

## 2014-12-09 MED ORDER — METRONIDAZOLE 500 MG PO TABS: 500.0000 mg | ORAL_TABLET | Freq: Two times a day (BID) | ORAL | Status: DC

## 2014-12-09 MED ORDER — ONDANSETRON 4 MG PO TBDP: 4.0000 mg | ORAL_TABLET | Freq: Three times a day (TID) | ORAL | Status: DC | PRN

## 2014-12-09 MED ORDER — DICYCLOMINE HCL 20 MG PO TABS: 20.0000 mg | ORAL_TABLET | Freq: Two times a day (BID) | ORAL | Status: DC

## 2014-12-09 MED ORDER — CIPROFLOXACIN HCL 500 MG PO TABS: 500.0000 mg | ORAL_TABLET | Freq: Two times a day (BID) | ORAL | Status: DC

## 2014-12-09 MED ORDER — HYDROCORTISONE ACE-PRAMOXINE 1-1 % RE FOAM: 1.0000 | Freq: Two times a day (BID) | RECTAL | Status: DC

## 2014-12-09 NOTE — Discharge Instructions (Signed)
Colitis Colitis is inflammation of the colon. Colitis can be a short-term or long-standing (chronic) illness. Crohn's disease and ulcerative colitis are 2 types of colitis which are chronic. They usually require lifelong treatment. CAUSES  There are many different causes of colitis, including:  Viruses.  Germs (bacteria).  Medicine reactions. SYMPTOMS   Diarrhea.  Intestinal bleeding.  Pain.  Fever.  Throwing up (vomiting).  Tiredness (fatigue).  Weight loss.  Bowel blockage. DIAGNOSIS  The diagnosis of colitis is based on examination and stool or blood tests. X-rays, CT scan, and colonoscopy may also be needed. TREATMENT  Treatment may include:  Fluids given through the vein (intravenously).  Bowel rest (nothing to eat or drink for a period of time).  Medicine for pain and diarrhea.  Medicines (antibiotics) that kill germs.  Cortisone medicines.  Surgery. HOME CARE INSTRUCTIONS   Get plenty of rest.  Drink enough water and fluids to keep your urine clear or pale yellow.  Eat a well-balanced diet.  Call your caregiver for follow-up as recommended. SEEK IMMEDIATE MEDICAL CARE IF:   You develop chills.  You have an oral temperature above 102 F (38.9 C), not controlled by medicine.  You have extreme weakness, fainting, or dehydration.  You have repeated vomiting.  You develop severe belly (abdominal) pain or are passing bloody or tarry stools. MAKE SURE YOU:   Understand these instructions.  Will watch your condition.  Will get help right away if you are not doing well or get worse. Document Released: 08/13/2004 Document Revised: 09/28/2011 Document Reviewed: 11/08/2009 [facility] Patient Information 2015 [place], [place]. This information is not intended to replace advice given to you by your health care provider. Make sure you discuss any questions you have with your health care provider. Nausea and Vomiting Nausea is a sick feeling that often  comes before throwing up (vomiting). Vomiting is a reflex where stomach contents come out of your mouth. Vomiting can cause severe loss of body fluids (dehydration). Children and elderly adults can become dehydrated quickly, especially if they also have diarrhea. Nausea and vomiting are symptoms of a condition or disease. It is important to find the cause of your symptoms. CAUSES   Direct irritation of the stomach lining. This irritation can result from increased acid production (gastroesophageal reflux disease), infection, food poisoning, taking certain medicines (such as nonsteroidal anti-inflammatory drugs), alcohol use, or tobacco use.  Signals from the brain.These signals could be caused by a headache, heat exposure, an inner ear disturbance, increased pressure in the brain from injury, infection, a tumor, or a concussion, pain, emotional stimulus, or metabolic problems.  An obstruction in the gastrointestinal tract (bowel obstruction).  Illnesses such as diabetes, hepatitis, gallbladder problems, appendicitis, kidney problems, cancer, sepsis, atypical symptoms of a heart attack, or eating disorders.  Medical treatments such as chemotherapy and radiation.  Receiving medicine that makes you sleep (general anesthetic) during surgery. DIAGNOSIS Your caregiver may ask for tests to be done if the problems do not improve after a few days. Tests may also be done if symptoms are severe or if the reason for the nausea and vomiting is not clear. Tests may include:  Urine tests.  Blood tests.  Stool tests.  Cultures (to look for evidence of infection).  X-rays or other imaging studies. Test results can help your caregiver make decisions about treatment or the need for additional tests. TREATMENT You need to stay well hydrated. Drink frequently but in small amounts.You may wish to drink water, sports drinks, clear  broth, or eat frozen ice pops or gelatin dessert to help stay hydrated.When you  eat, eating slowly may help prevent nausea.There are also some antinausea medicines that may help prevent nausea. HOME CARE INSTRUCTIONS   Take all medicine as directed by your caregiver.  If you do not have an appetite, do not force yourself to eat. However, you must continue to drink fluids.  If you have an appetite, eat a normal diet unless your caregiver tells you differently.  Eat a variety of complex carbohydrates (rice, wheat, potatoes, bread), lean meats, yogurt, fruits, and vegetables.  Avoid high-fat foods because they are more difficult to digest.  Drink enough water and fluids to keep your urine clear or pale yellow.  If you are dehydrated, ask your caregiver for specific rehydration instructions. Signs of dehydration may include:  Severe thirst.  Dry lips and mouth.  Dizziness.  Dark urine.  Decreasing urine frequency and amount.  Confusion.  Rapid breathing or pulse. SEEK IMMEDIATE MEDICAL CARE IF:   You have blood or brown flecks (like coffee grounds) in your vomit.  You have black or bloody stools.  You have a severe headache or stiff neck.  You are confused.  You have severe abdominal pain.  You have chest pain or trouble breathing.  You do not urinate at least once every 8 hours.  You develop cold or clammy skin.  You continue to vomit for longer than 24 to 48 hours.  You have a fever. MAKE SURE YOU:   Understand these instructions.  Will watch your condition.  Will get help right away if you are not doing well or get worse. Document Released: 07/06/2005 Document Revised: 09/28/2011 Document Reviewed: 12/03/2010 [facility] Patient Information 2015 [place], [place]. This information is not intended to replace advice given to you by your health care provider. Make sure you discuss any questions you have with your health care provider. Hemorrhoids Hemorrhoids are swollen veins around the rectum or anus. There are two types of hemorrhoids:    Internal hemorrhoids. These occur in the veins just inside the rectum. They may poke through to the outside and become irritated and painful.  External hemorrhoids. These occur in the veins outside the anus and can be felt as a painful swelling or hard lump near the anus. CAUSES  Pregnancy.   Obesity.   Constipation or diarrhea.   Straining to have a bowel movement.   Sitting for long periods on the toilet.  Heavy lifting or other activity that caused you to strain.  Anal intercourse. SYMPTOMS   Pain.   Anal itching or irritation.   Rectal bleeding.   Fecal leakage.   Anal swelling.   One or more lumps around the anus.  DIAGNOSIS  Your caregiver may be able to diagnose hemorrhoids by visual examination. Other examinations or tests that may be performed include:   Examination of the rectal area with a gloved hand (digital rectal exam).   Examination of anal canal using a small tube (scope).   A blood test if you have lost a significant amount of blood.  A test to look inside the colon (sigmoidoscopy or colonoscopy). TREATMENT Most hemorrhoids can be treated at home. However, if symptoms do not seem to be getting better or if you have a lot of rectal bleeding, your caregiver may perform a procedure to help make the hemorrhoids get smaller or remove them completely. Possible treatments include:   Placing a rubber band at the base of the hemorrhoid to  cut off the circulation (rubber band ligation).   Injecting a chemical to shrink the hemorrhoid (sclerotherapy).   Using a tool to burn the hemorrhoid (infrared light therapy).   Surgically removing the hemorrhoid (hemorrhoidectomy).   Stapling the hemorrhoid to block blood flow to the tissue (hemorrhoid stapling).  HOME CARE INSTRUCTIONS   Eat foods with fiber, such as whole grains, beans, nuts, fruits, and vegetables. Ask your doctor about taking products with added fiber in them  (fibersupplements).  Increase fluid intake. Drink enough water and fluids to keep your urine clear or pale yellow.   Exercise regularly.   Go to the bathroom when you have the urge to have a bowel movement. Do not wait.   Avoid straining to have bowel movements.   Keep the anal area dry and clean. Use wet toilet paper or moist towelettes after a bowel movement.   Medicated creams and suppositories may be used or applied as directed.   Only take over-the-counter or prescription medicines as directed by your caregiver.   Take warm sitz baths for 15-20 minutes, 3-4 times a day to ease pain and discomfort.   Place ice packs on the hemorrhoids if they are tender and swollen. Using ice packs between sitz baths may be helpful.   Put ice in a plastic bag.   Place a towel between your skin and the bag.   Leave the ice on for 15-20 minutes, 3-4 times a day.   Do not use a donut-shaped pillow or sit on the toilet for long periods. This increases blood pooling and pain.  SEEK MEDICAL CARE IF:  You have increasing pain and swelling that is not controlled by treatment or medicine.  You have uncontrolled bleeding.  You have difficulty or you are unable to have a bowel movement.  You have pain or inflammation outside the area of the hemorrhoids. MAKE SURE YOU:  Understand these instructions.  Will watch your condition.  Will get help right away if you are not doing well or get worse. Document Released: 07/03/2000 Document Revised: 06/22/2012 Document Reviewed: 05/10/2012 [facility] Patient Information 2015 [place], [place]. This information is not intended to replace advice given to you by your health care provider. Make sure you discuss any questions you have with your health care provider.

## 2014-12-09 NOTE — ED Notes (Signed)
Pt alert,oriented, and ambulatory upon DC. She was advised to follow up with PCP and and GI in 3 days.

## 2014-12-12 ENCOUNTER — Encounter (HOSPITAL_COMMUNITY): Payer: Self-pay | Admitting: *Deleted

## 2014-12-12 ENCOUNTER — Inpatient Hospital Stay (HOSPITAL_COMMUNITY)
Admission: EM | Admit: 2014-12-12 | Discharge: 2014-12-15 | DRG: 373 | Disposition: A | Discharge status: Home / Self Care | Payer: Self-pay | Attending: Internal Medicine | Admitting: Internal Medicine

## 2014-12-12 DIAGNOSIS — A047 Enterocolitis due to Clostridium difficile: Principal | ICD-10-CM | POA: Diagnosis present

## 2014-12-12 DIAGNOSIS — R748 Abnormal levels of other serum enzymes: Secondary | ICD-10-CM | POA: Diagnosis present

## 2014-12-12 DIAGNOSIS — R569 Unspecified convulsions: Secondary | ICD-10-CM | POA: Diagnosis present

## 2014-12-12 DIAGNOSIS — R740 Nonspecific elevation of levels of transaminase and lactic acid dehydrogenase [LDH]: Secondary | ICD-10-CM | POA: Diagnosis present

## 2014-12-12 DIAGNOSIS — K649 Unspecified hemorrhoids: Secondary | ICD-10-CM | POA: Diagnosis present

## 2014-12-12 DIAGNOSIS — F411 Generalized anxiety disorder: Secondary | ICD-10-CM | POA: Diagnosis present

## 2014-12-12 DIAGNOSIS — D638 Anemia in other chronic diseases classified elsewhere: Secondary | ICD-10-CM | POA: Diagnosis present

## 2014-12-12 DIAGNOSIS — F1010 Alcohol abuse, uncomplicated: Secondary | ICD-10-CM | POA: Diagnosis present

## 2014-12-12 DIAGNOSIS — D72819 Decreased white blood cell count, unspecified: Secondary | ICD-10-CM | POA: Diagnosis present

## 2014-12-12 DIAGNOSIS — Z823 Family history of stroke: Secondary | ICD-10-CM

## 2014-12-12 DIAGNOSIS — Z8674 Personal history of sudden cardiac arrest: Secondary | ICD-10-CM

## 2014-12-12 DIAGNOSIS — F419 Anxiety disorder, unspecified: Secondary | ICD-10-CM

## 2014-12-12 DIAGNOSIS — F329 Major depressive disorder, single episode, unspecified: Secondary | ICD-10-CM | POA: Diagnosis present

## 2014-12-12 DIAGNOSIS — F32A Depression, unspecified: Secondary | ICD-10-CM | POA: Diagnosis present

## 2014-12-12 DIAGNOSIS — G8929 Other chronic pain: Secondary | ICD-10-CM | POA: Diagnosis present

## 2014-12-12 DIAGNOSIS — K921 Melena: Secondary | ICD-10-CM | POA: Diagnosis present

## 2014-12-12 DIAGNOSIS — K529 Noninfective gastroenteritis and colitis, unspecified: Secondary | ICD-10-CM | POA: Diagnosis present

## 2014-12-12 DIAGNOSIS — Z833 Family history of diabetes mellitus: Secondary | ICD-10-CM

## 2014-12-12 DIAGNOSIS — E876 Hypokalemia: Secondary | ICD-10-CM | POA: Diagnosis present

## 2014-12-12 DIAGNOSIS — D6959 Other secondary thrombocytopenia: Secondary | ICD-10-CM | POA: Diagnosis present

## 2014-12-12 DIAGNOSIS — A0472 Enterocolitis due to Clostridium difficile, not specified as recurrent: Secondary | ICD-10-CM | POA: Insufficient documentation

## 2014-12-12 DIAGNOSIS — Z79899 Other long term (current) drug therapy: Secondary | ICD-10-CM

## 2014-12-12 LAB — CBC WITH DIFFERENTIAL/PLATELET
Basophils Absolute: 0 10*3/uL (ref 0.0–0.1)
Basophils Relative: 0 % (ref 0–1)
EOS PCT: 0 % (ref 0–5)
Eosinophils Absolute: 0 10*3/uL (ref 0.0–0.7)
HCT: 31.4 % — ABNORMAL LOW (ref 36.0–46.0)
HEMOGLOBIN: 10.6 g/dL — AB (ref 12.0–15.0)
Lymphocytes Relative: 12 % (ref 12–46)
Lymphs Abs: 0.4 10*3/uL — ABNORMAL LOW (ref 0.7–4.0)
MCH: 30.6 pg (ref 26.0–34.0)
MCHC: 33.8 g/dL (ref 30.0–36.0)
MCV: 90.8 fL (ref 78.0–100.0)
MONO ABS: 0.5 10*3/uL (ref 0.1–1.0)
Monocytes Relative: 14 % — ABNORMAL HIGH (ref 3–12)
NEUTROS ABS: 2.6 10*3/uL (ref 1.7–7.7)
NEUTROS PCT: 74 % (ref 43–77)
Platelets: 71 10*3/uL — ABNORMAL LOW (ref 150–400)
RBC: 3.46 MIL/uL — ABNORMAL LOW (ref 3.87–5.11)
RDW: 17.1 % — ABNORMAL HIGH (ref 11.5–15.5)
WBC: 3.6 10*3/uL — AB (ref 4.0–10.5)

## 2014-12-12 LAB — URINE MICROSCOPIC-ADD ON

## 2014-12-12 LAB — COMPREHENSIVE METABOLIC PANEL
ALBUMIN: 4.1 g/dL (ref 3.5–5.0)
ALT: 18 U/L (ref 14–54)
AST: 79 U/L — AB (ref 15–41)
Alkaline Phosphatase: 132 U/L — ABNORMAL HIGH (ref 38–126)
Anion gap: 16 — ABNORMAL HIGH (ref 5–15)
CHLORIDE: 98 mmol/L — AB (ref 101–111)
CO2: 22 mmol/L (ref 22–32)
CREATININE: 0.38 mg/dL — AB (ref 0.44–1.00)
Calcium: 7.9 mg/dL — ABNORMAL LOW (ref 8.9–10.3)
GFR calc Af Amer: >60 mL/min (ref >60)
GFR calc non Af Amer: >60 mL/min (ref >60)
Glucose, Bld: 123 mg/dL — ABNORMAL HIGH (ref 65–99)
Potassium: 3.3 mmol/L — ABNORMAL LOW (ref 3.5–5.1)
Sodium: 136 mmol/L (ref 135–145)
Total Bilirubin: 2.3 mg/dL — ABNORMAL HIGH (ref 0.3–1.2)
Total Protein: 7.1 g/dL (ref 6.5–8.1)

## 2014-12-12 LAB — URINALYSIS, ROUTINE W REFLEX MICROSCOPIC
BILIRUBIN URINE: NEGATIVE
Glucose, UA: NEGATIVE mg/dL
Ketones, ur: 40 mg/dL — AB
Leukocytes, UA: NEGATIVE
Nitrite: NEGATIVE
PH: 8.5 — AB (ref 5.0–8.0)
PROTEIN: 30 mg/dL — AB
Specific Gravity, Urine: 1.019 (ref 1.005–1.030)
Urobilinogen, UA: 1 mg/dL (ref 0.0–1.0)

## 2014-12-12 LAB — PREGNANCY, URINE: PREG TEST UR: NEGATIVE

## 2014-12-12 LAB — POC OCCULT BLOOD, ED: Fecal Occult Bld: POSITIVE — AB

## 2014-12-12 MED ORDER — ONDANSETRON HCL 4 MG/2ML IJ SOLN
4.0000 mg | Freq: Once | INTRAMUSCULAR | Status: AC
  Administered 2014-12-12: 4 mg via INTRAVENOUS
  Filled 2014-12-12: qty 2

## 2014-12-12 MED ORDER — SODIUM CHLORIDE 0.9 % IV BOLUS (SEPSIS)
1000.0000 mL | Freq: Once | INTRAVENOUS | Status: AC
  Administered 2014-12-12: 1000 mL via INTRAVENOUS

## 2014-12-12 MED ORDER — ONDANSETRON 4 MG PO TBDP
4.0000 mg | ORAL_TABLET | Freq: Once | ORAL | Status: AC
  Administered 2014-12-12: 4 mg via ORAL
  Filled 2014-12-12: qty 1

## 2014-12-12 MED ORDER — CIPROFLOXACIN HCL 500 MG PO TABS
500.0000 mg | ORAL_TABLET | Freq: Once | ORAL | Status: AC
  Administered 2014-12-12: 500 mg via ORAL
  Filled 2014-12-12: qty 1

## 2014-12-12 MED ORDER — METRONIDAZOLE 500 MG PO TABS
500.0000 mg | ORAL_TABLET | Freq: Once | ORAL | Status: AC
  Administered 2014-12-12: 500 mg via ORAL
  Filled 2014-12-12: qty 1

## 2014-12-12 MED ORDER — HYDROMORPHONE HCL 1 MG/ML IJ SOLN
1.0000 mg | Freq: Once | INTRAMUSCULAR | Status: AC
  Administered 2014-12-12: 1 mg via INTRAMUSCULAR
  Filled 2014-12-12: qty 1

## 2014-12-12 MED ORDER — HYDROMORPHONE HCL 1 MG/ML IJ SOLN
1.0000 mg | Freq: Once | INTRAMUSCULAR | Status: AC
Start: 2014-12-12 — End: 2014-12-12
  Administered 2014-12-12: 1 mg via INTRAMUSCULAR

## 2014-12-12 MED ORDER — ONDANSETRON HCL 4 MG/2ML IJ SOLN
4.0000 mg | Freq: Once | INTRAMUSCULAR | Status: DC
  Filled 2014-12-12: qty 2

## 2014-12-12 MED ORDER — HYDROMORPHONE HCL 1 MG/ML IJ SOLN
1.0000 mg | Freq: Once | INTRAMUSCULAR | Status: DC
Start: 2014-12-12 — End: 2014-12-12
  Filled 2014-12-12: qty 1

## 2014-12-12 NOTE — ED Notes (Addendum)
Per EMS-c/o lower quadrant abdominal pain "for weeks." Was seen Saturday for the same symptoms and given prescriptions which have not been filled. N/V/D for "an extended time" with hemorrhoids and blood in stool. VS: 126/74 HR 78 RR 20 SpO2 98%. Running occasional PVCs on the monitor en route. 4 mg Zofran given IM.

## 2014-12-12 NOTE — ED Provider Notes (Signed)
CSN: [id number]     Arrival date & time 12/12/14  1438 History   First MD Initiated Contact with Patient 12/12/14 [phone]     Chief Complaint  Patient presents with  . Abdominal Pain    lower quadrant     (Consider location/radiation/quality/duration/timing/severity/associated sxs/prior Treatment) HPI Comments: Pt with hx of ETOH abuse, seizures, anemia, presents with lower abd pain.  Describes as sharp, crampy pain that has been there about one month, has worsened over last few days.  Has associated n/v.  Also has large hemorrhoids which she says are bleeding, producing bloody stools.  Has some loose, diarrhea, mixed with blood.  Has blood when she wipes and in toilet.  Was treated earlier this month for ETOH detox, but denies drinking since that time.  No fevers.  No urinary symptoms.  Was seen here on 12/09/14 for similar symptoms and had CT abd/pelvis done showing colitis.  Was given rx for cipro and flagyl, but has not been able to get them filled due to finances.  Patient is a 39 y.o. female presenting with abdominal pain.  Abdominal Pain Associated symptoms: nausea and vomiting   Associated symptoms: no chest pain, no chills, no cough, no diarrhea, no fatigue, no fever, no hematuria and no shortness of breath     Past Medical History  Diagnosis Date  . Proctitis   . Cysts of both ovaries   . Anemia   . Anxiety   . Blood transfusion without reported diagnosis   . Depression   . Fatty liver 10/05/13  . Cardiac arrest   . Seizures    Past Surgical History  Procedure Laterality Date  . Ovarian cyst removal    . Laparoscopy N/A 09/28/2013    Procedure: LAPAROSCOPY OPERATIVE;  Surgeon: Terrance Mass, MD;  Location: [facility];  Service: Gynecology;  Laterality: N/A;  . Laparoscopic appendectomy Right 09/28/2013    Procedure: APPENDECTOMY LAPAROSCOPIC;  Surgeon: Terrance Mass, MD;  Location: [facility];  Service: Gynecology;  Laterality: Right;  . Salpingoophorectomy Right 09/28/2013   Procedure: SALPINGO OOPHORECTOMY;  Surgeon: Terrance Mass, MD;  Location: [facility];  Service: Gynecology;  Laterality: Right;  . Colonoscopy N/A 09/30/2013    Procedure: COLONOSCOPY;  Surgeon: Lafayette Dragon, MD;  Location: [facility];  Service: Endoscopy;  Laterality: N/A;  . Esophagogastroduodenoscopy N/A 11/23/2013    Procedure: ESOPHAGOGASTRODUODENOSCOPY (EGD);  Surgeon: Jerene Bears, MD;  Location: [facility];  Service: Endoscopy;  Laterality: N/A;  . Appendectomy    . Left and right heart catheterization with coronary angiogram N/A 02/23/2014    Procedure: LEFT AND RIGHT HEART CATHETERIZATION WITH CORONARY ANGIOGRAM;  Surgeon: Leonie Man, MD;  Location: [facility] CATH LAB;  Service: Cardiovascular;  Laterality: N/A;   Family History  Problem Relation Age of Onset  . Diabetes Mother   . Hyperlipidemia Mother   . Stroke Mother   . Diabetes Father    History  Substance Use Topics  . Smoking status: Never Smoker   . Smokeless tobacco: Never Used  . Alcohol Use: Yes   OB History    Gravida Para Term Preterm AB TAB SAB Ectopic Multiple Living   7 3   4  4   3      Review of Systems  Constitutional: Negative for fever, chills, diaphoresis and fatigue.  HENT: Negative for congestion, rhinorrhea and sneezing.   Eyes: Negative.   Respiratory: Negative for cough, chest tightness and shortness of breath.   Cardiovascular: Negative for  chest pain and leg swelling.  Gastrointestinal: Positive for nausea, vomiting, abdominal pain and blood in stool. Negative for diarrhea.  Genitourinary: Negative for frequency, hematuria, flank pain and difficulty urinating.  Musculoskeletal: Negative for back pain and arthralgias.  Skin: Negative for rash.  Neurological: Negative for dizziness, speech difficulty, weakness, numbness and headaches.      Allergies  Morphine and related; Tramadol; and Penicillins  Home Medications   Prior to Admission medications   Medication Sig Start Date End Date  Taking? Authorizing Provider  ciprofloxacin (CIPRO) 500 MG tablet Take 1 tablet (500 mg total) by mouth every 12 (twelve) hours. 12/09/14  Yes Waynetta Pean, PA-C  dicyclomine (BENTYL) 20 MG tablet Take 1 tablet (20 mg total) by mouth 2 (two) times daily. 12/09/14  Yes Waynetta Pean, PA-C  hydrocortisone-pramoxine (PROCTOFOAM [facility]) rectal foam Place 1 applicator rectally 2 (two) times daily. 12/09/14  Yes Waynetta Pean, PA-C  ibuprofen (ADVIL,MOTRIN) 200 MG tablet Take 400 mg by mouth every 6 (six) hours as needed for moderate pain (pain).   Yes Historical Provider, MD  levETIRAcetam (KEPPRA) 1000 MG tablet Take 1 tablet (1,000 mg total) by mouth daily. For seizure activities Patient taking differently: Take 1,000 mg by mouth 2 (two) times daily. For seizure activities 07/04/14  Yes Encarnacion Slates, NP  metroNIDAZOLE (FLAGYL) 500 MG tablet Take 1 tablet (500 mg total) by mouth 2 (two) times daily. 12/09/14  Yes Waynetta Pean, PA-C  mirtazapine (REMERON) 30 MG tablet Take 1 tablet (30 mg total) by mouth at bedtime. For depression/sleep Patient taking differently: Take 30 mg by mouth at bedtime as needed (sleep). For depression/sleep 07/04/14  Yes Encarnacion Slates, NP  ondansetron (ZOFRAN ODT) 4 MG disintegrating tablet Take 1 tablet (4 mg total) by mouth every 8 (eight) hours as needed for nausea or vomiting. 12/09/14  Yes Waynetta Pean, PA-C  Tetrahydrozoline HCl (VISINE OP) Apply 1-2 drops to eye daily as needed (dry eyes).   Yes Historical Provider, MD  carvedilol (COREG) 3.125 MG tablet Take 0.5 tablets (1.5625 mg total) by mouth 2 (two) times daily with a meal. For high blood pressure Patient not taking: Reported on 11/28/2014 07/04/14   Encarnacion Slates, NP  folic acid (FOLVITE) 1 MG tablet Take 1 tablet (1 mg total) by mouth daily. For low folate Patient not taking: Reported on 11/26/2014 07/04/14   Encarnacion Slates, NP   BP 152/79 mmHg  Pulse 73  Temp(Src) 98.2 F (36.8 C) (Oral)  Resp 16  SpO2 97%   LMP 11/18/2014 Physical Exam  Constitutional: She is oriented to person, place, and time. She appears well-developed and well-nourished.  HENT:  Head: Normocephalic and atraumatic.  Eyes: Pupils are equal, round, and reactive to light.  Neck: Normal range of motion. Neck supple.  Cardiovascular: Normal rate, regular rhythm and normal heart sounds.   Pulmonary/Chest: Effort normal and breath sounds normal. No respiratory distress. She has no wheezes. She has no rales. She exhibits no tenderness.  Abdominal: Soft. Bowel sounds are normal. There is tenderness (moderate TTP across lower abdomen). There is no rebound and no guarding.  Genitourinary:  Large non-thrombosed external hemorrhoids, rectal with no stool, small amount of blood  Musculoskeletal: Normal range of motion. She exhibits no edema.  Lymphadenopathy:    She has no cervical adenopathy.  Neurological: She is alert and oriented to person, place, and time.  Skin: Skin is warm and dry. No rash noted.  Psychiatric: She has a normal mood and affect.  ED Course  Procedures (including critical care time) Labs Review Labs Reviewed  COMPREHENSIVE METABOLIC PANEL - Abnormal; Notable for the following:    Potassium 3.3 (*)    Chloride 98 (*)    Glucose, Bld 123 (*)    BUN <5 (*)    Creatinine, Ser 0.38 (*)    Calcium 7.9 (*)    AST 79 (*)    Alkaline Phosphatase 132 (*)    Total Bilirubin 2.3 (*)    Anion gap 16 (*)    All other components within normal limits  CBC WITH DIFFERENTIAL/PLATELET - Abnormal; Notable for the following:    WBC 3.6 (*)    RBC 3.46 (*)    Hemoglobin 10.6 (*)    HCT 31.4 (*)    RDW 17.1 (*)    Platelets 71 (*)    Lymphs Abs 0.4 (*)    Monocytes Relative 14 (*)    All other components within normal limits  URINALYSIS, ROUTINE W REFLEX MICROSCOPIC - Abnormal; Notable for the following:    Color, Urine AMBER (*)    APPearance CLOUDY (*)    pH 8.5 (*)    Hgb urine dipstick MODERATE (*)     Ketones, ur 40 (*)    Protein, ur 30 (*)    All other components within normal limits  URINE MICROSCOPIC-ADD ON - Abnormal; Notable for the following:    Squamous Epithelial / LPF FEW (*)    All other components within normal limits  POC OCCULT BLOOD, ED - Abnormal; Notable for the following:    Fecal Occult Bld POSITIVE (*)    All other components within normal limits  PREGNANCY, URINE    Imaging Review No results found.   EKG Interpretation None      MDM   Final diagnoses:  Colitis    Patient is given IV fluids as well as Dilaudid and Zofran. She still complains of increased pain to her lower abdomen and ongoing dry heaves. She had a CT scan done 4 days ago and at this point I don't feel that it needs to be repeated. I do feel that with her having no improvement in the ED with our treatment she likely does need to be admitted for antibiotics and further observation. I'll consult the hospitalist for admission.    Malvin Johns, MD 12/12/14 2329

## 2014-12-12 NOTE — ED Notes (Signed)
Informed the pt a urine specimen is needed.

## 2014-12-12 NOTE — H&P (Signed)
[facility] History and Physical  Patient: Kristen Roberson  MRN: [id number]  DOB: 1975/12/31  DOS: the patient was seen and examined on 12/12/2014 PCP: Angelica Chessman, MD  Referring physician: Malvin Johns, MD Chief Complaint: Abdominal pain nausea vomiting  HPI: Kristen Roberson is a 39 y.o. female with Past medical history of seizures, cardiac arrest, alcohol abuse, anemia, recurrent admissions for abdominal pain. The patient presents with complains of abdominal pain. The patient has been seen in the ER multiple times for abdominal pain in the past and has been admitted as well. She was seen on 12/09/2014 in the ER and was complaining of abdominal pain and had a CAT scan of the abdomen and pelvis which are showing colitis. She was prescribed Cipro and Flagyl and was discharged home. After going home the patient has not refilled the prescription. Initially she told the ED that she did not feel it due to finances but then she told me that she did not fill it because she was unable to get out of her house because of her symptoms. She started having nausea and vomiting. She mentions that she has a bowel movement once every hour and vomiting 20+ times. She denies any chest pain or shortness of breath complaints of fever and chills. She denies any burning urination. She complains of bleeding bright red blood per rectum occasionally with the bowel movements. She denies any rash anywhere.  The patient is coming from home And at her baseline independent for most of her ADL.  Review of Systems: as mentioned in the history of present illness.  A comprehensive review of the other systems is negative.  Past Medical History  Diagnosis Date  . Proctitis   . Cysts of both ovaries   . Anemia   . Anxiety   . Blood transfusion without reported diagnosis   . Depression   . Fatty liver 10/05/13  . Cardiac arrest   . Seizures    Past Surgical History  Procedure Laterality Date  . Ovarian cyst  removal    . Laparoscopy N/A 09/28/2013    Procedure: LAPAROSCOPY OPERATIVE;  Surgeon: Terrance Mass, MD;  Location: [facility];  Service: Gynecology;  Laterality: N/A;  . Laparoscopic appendectomy Right 09/28/2013    Procedure: APPENDECTOMY LAPAROSCOPIC;  Surgeon: Terrance Mass, MD;  Location: [facility];  Service: Gynecology;  Laterality: Right;  . Salpingoophorectomy Right 09/28/2013    Procedure: SALPINGO OOPHORECTOMY;  Surgeon: Terrance Mass, MD;  Location: [facility];  Service: Gynecology;  Laterality: Right;  . Colonoscopy N/A 09/30/2013    Procedure: COLONOSCOPY;  Surgeon: Lafayette Dragon, MD;  Location: [facility];  Service: Endoscopy;  Laterality: N/A;  . Esophagogastroduodenoscopy N/A 11/23/2013    Procedure: ESOPHAGOGASTRODUODENOSCOPY (EGD);  Surgeon: Jerene Bears, MD;  Location: [facility];  Service: Endoscopy;  Laterality: N/A;  . Appendectomy    . Left and right heart catheterization with coronary angiogram N/A 02/23/2014    Procedure: LEFT AND RIGHT HEART CATHETERIZATION WITH CORONARY ANGIOGRAM;  Surgeon: Leonie Man, MD;  Location: [facility] CATH LAB;  Service: Cardiovascular;  Laterality: N/A;   Social History:  reports that she has never smoked. She has never used smokeless tobacco. She reports that she drinks alcohol. She reports that she does not use illicit drugs.  Allergies  Allergen Reactions  . Morphine And Related Anaphylaxis     Tolerated hydromorphone on 11/25/13.   . Tramadol Other (See Comments)    Seizures   . Penicillins Other (  See Comments)    Unknown childhood reaction.    Family History  Problem Relation Age of Onset  . Diabetes Mother   . Hyperlipidemia Mother   . Stroke Mother   . Diabetes Father     Prior to Admission medications   Medication Sig Start Date End Date Taking? Authorizing Provider  ciprofloxacin (CIPRO) 500 MG tablet Take 1 tablet (500 mg total) by mouth every 12 (twelve) hours. 12/09/14  Yes Waynetta Pean, PA-C  dicyclomine (BENTYL) 20 MG  tablet Take 1 tablet (20 mg total) by mouth 2 (two) times daily. 12/09/14  Yes Waynetta Pean, PA-C  hydrocortisone-pramoxine (PROCTOFOAM [facility]) rectal foam Place 1 applicator rectally 2 (two) times daily. 12/09/14  Yes Waynetta Pean, PA-C  ibuprofen (ADVIL,MOTRIN) 200 MG tablet Take 400 mg by mouth every 6 (six) hours as needed for moderate pain (pain).   Yes Historical Provider, MD  levETIRAcetam (KEPPRA) 1000 MG tablet Take 1 tablet (1,000 mg total) by mouth daily. For seizure activities Patient taking differently: Take 1,000 mg by mouth 2 (two) times daily. For seizure activities 07/04/14  Yes Encarnacion Slates, NP  metroNIDAZOLE (FLAGYL) 500 MG tablet Take 1 tablet (500 mg total) by mouth 2 (two) times daily. 12/09/14  Yes Waynetta Pean, PA-C  mirtazapine (REMERON) 30 MG tablet Take 1 tablet (30 mg total) by mouth at bedtime. For depression/sleep Patient taking differently: Take 30 mg by mouth at bedtime as needed (sleep). For depression/sleep 07/04/14  Yes Encarnacion Slates, NP  ondansetron (ZOFRAN ODT) 4 MG disintegrating tablet Take 1 tablet (4 mg total) by mouth every 8 (eight) hours as needed for nausea or vomiting. 12/09/14  Yes Waynetta Pean, PA-C  Tetrahydrozoline HCl (VISINE OP) Apply 1-2 drops to eye daily as needed (dry eyes).   Yes Historical Provider, MD  carvedilol (COREG) 3.125 MG tablet Take 0.5 tablets (1.5625 mg total) by mouth 2 (two) times daily with a meal. For high blood pressure Patient not taking: Reported on 11/28/2014 07/04/14   Encarnacion Slates, NP  folic acid (FOLVITE) 1 MG tablet Take 1 tablet (1 mg total) by mouth daily. For low folate Patient not taking: Reported on 11/26/2014 07/04/14   Encarnacion Slates, NP    Physical Exam: Filed Vitals:   12/12/14 2200 12/12/14 2230 12/12/14 2300 12/12/14 2330  BP: 129/86 144/97 152/79 139/87  Pulse: 72 73 73   Temp:      TempSrc:      Resp:      SpO2: 98% 96% 97%     General: Alert, Awake and Oriented to Time, Place and Person. Appear in  mild distress Eyes: PERRL ENT: Oral Mucosa clear moist. Neck: no JVD Cardiovascular: S1 and S2 Present, no Murmur, Peripheral Pulses Present No tachycardia Respiratory: Bilateral Air entry equal and Decreased,  Clear to Auscultation, no Crackles, no wheezes Abdomen: Bowel Sound present, Soft and diffusely tender Skin: no Rash Extremities: no Pedal edema, no calf tenderness Neurologic: Grossly no focal neuro deficit. Labs on Admission:  CBC:  Recent Labs Lab 12/08/14 2020 12/12/14 1536  WBC 3.5* 3.6*  NEUTROABS 1.8 2.6  HGB 11.0* 10.6*  HCT 33.3* 31.4*  MCV 92.5 90.8  PLT 130* 71*    CMP     Component Value Date/Time   NA 136 12/12/2014 1536   K 3.3* 12/12/2014 1536   CL 98* 12/12/2014 1536   CO2 22 12/12/2014 1536   GLUCOSE 123* 12/12/2014 1536   BUN <5* 12/12/2014 1536   CREATININE 0.38*  12/12/2014 1536   CALCIUM 7.9* 12/12/2014 1536   PROT 7.1 12/12/2014 1536   ALBUMIN 4.1 12/12/2014 1536   AST 79* 12/12/2014 1536   ALT 18 12/12/2014 1536   ALKPHOS 132* 12/12/2014 1536   BILITOT 2.3* 12/12/2014 1536   GFRNONAA >60 12/12/2014 1536   GFRAA >60 12/12/2014 1536     Recent Labs Lab 12/08/14 1917  LIPASE 30    No results for input(s): CKTOTAL, CKMB, CKMBINDEX, TROPONINI in the last 168 hours. BNP (last 3 results)  Recent Labs  08/15/14 1903  BNP 43.8    ProBNP (last 3 results)  Recent Labs  03/25/14 1650 04/03/14 2155  PROBNP 127.2* 12.8     Radiological Exams on Admission: No results found. Assessment/Plan Principal Problem:   Colitis Active Problems:   Anxiety   Hematochezia   GAD (generalized anxiety disorder)   1. Colitis The patient is presenting with compressive abdominal pain nausea and vomiting. Recent CAT scan is positive for pancolitis. Patient has not been taking Cipro and Flagyl due to finances. She denies that she has been taking any alcohol lately and has quit alcohol recently. I would give her IV fluids IV Zofran as  needed. Pain management with Toradol. We will use Pepcid as well. He will be placed on Cipro and Flagyl per pharmacy. We will consult GI in the morning. Monitor H&H.  2.History of seizure. Continue Keppra.  3. Chronic abdominal pain. Avoiding narcotics. With this Toradol for pain management. Continue Bentyl.  4. Mood disorder. Continue Remeron   DVT Prophylaxis: mechanical compression device  Nutrition: Nothing by mouth  Family Communication: family was present at bedside, opportunity was given to ask question and all questions were answered satisfactorily at the time of interview. Disposition: Admitted as inpatient, Med surge  unit.  Author: Berle Mull, MD [facility] Pager: [phone] 12/12/2014  If 7PM-7AM, please contact night-coverage [email or web address] Password [email or web address]
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
## 2014-12-13 DIAGNOSIS — K625 Hemorrhage of anus and rectum: Secondary | ICD-10-CM

## 2014-12-13 DIAGNOSIS — F1010 Alcohol abuse, uncomplicated: Secondary | ICD-10-CM

## 2014-12-13 DIAGNOSIS — K529 Noninfective gastroenteritis and colitis, unspecified: Secondary | ICD-10-CM

## 2014-12-13 DIAGNOSIS — K760 Fatty (change of) liver, not elsewhere classified: Secondary | ICD-10-CM

## 2014-12-13 DIAGNOSIS — K649 Unspecified hemorrhoids: Secondary | ICD-10-CM | POA: Insufficient documentation

## 2014-12-13 DIAGNOSIS — A047 Enterocolitis due to Clostridium difficile: Principal | ICD-10-CM

## 2014-12-13 DIAGNOSIS — D696 Thrombocytopenia, unspecified: Secondary | ICD-10-CM

## 2014-12-13 DIAGNOSIS — R748 Abnormal levels of other serum enzymes: Secondary | ICD-10-CM | POA: Insufficient documentation

## 2014-12-13 DIAGNOSIS — K921 Melena: Secondary | ICD-10-CM

## 2014-12-13 DIAGNOSIS — A0472 Enterocolitis due to Clostridium difficile, not specified as recurrent: Secondary | ICD-10-CM | POA: Insufficient documentation

## 2014-12-13 LAB — PROTIME-INR
INR: 1.15 (ref 0.00–1.49)
PROTHROMBIN TIME: 14.9 s (ref 11.6–15.2)

## 2014-12-13 LAB — COMPREHENSIVE METABOLIC PANEL
ALBUMIN: 3.6 g/dL (ref 3.5–5.0)
ALT: 13 U/L — AB (ref 14–54)
AST: 70 U/L — ABNORMAL HIGH (ref 15–41)
Alkaline Phosphatase: 115 U/L (ref 38–126)
Anion gap: 10 (ref 5–15)
BILIRUBIN TOTAL: 2.6 mg/dL — AB (ref 0.3–1.2)
BUN: <5 mg/dL — ABNORMAL LOW (ref 6–20)
CALCIUM: 7.1 mg/dL — AB (ref 8.9–10.3)
CHLORIDE: 103 mmol/L (ref 101–111)
CO2: 25 mmol/L (ref 22–32)
Creatinine, Ser: 0.36 mg/dL — ABNORMAL LOW (ref 0.44–1.00)
GFR calc Af Amer: >60 mL/min (ref >60)
GFR calc non Af Amer: >60 mL/min (ref >60)
GLUCOSE: 112 mg/dL — AB (ref 65–99)
POTASSIUM: 2.8 mmol/L — AB (ref 3.5–5.1)
SODIUM: 138 mmol/L (ref 135–145)
Total Protein: 6.2 g/dL — ABNORMAL LOW (ref 6.5–8.1)

## 2014-12-13 LAB — SEDIMENTATION RATE: Sed Rate: 4 mm/hr (ref 0–22)

## 2014-12-13 LAB — CBC
HCT: 30.1 % — ABNORMAL LOW (ref 36.0–46.0)
HEMOGLOBIN: 9.7 g/dL — AB (ref 12.0–15.0)
MCH: 30.4 pg (ref 26.0–34.0)
MCHC: 32.2 g/dL (ref 30.0–36.0)
MCV: 94.4 fL (ref 78.0–100.0)
Platelets: 47 10*3/uL — ABNORMAL LOW (ref 150–400)
RBC: 3.19 MIL/uL — AB (ref 3.87–5.11)
RDW: 17.7 % — ABNORMAL HIGH (ref 11.5–15.5)
WBC: 3.4 10*3/uL — AB (ref 4.0–10.5)

## 2014-12-13 LAB — C-REACTIVE PROTEIN: CRP: <0.5 mg/dL (ref <1.0)

## 2014-12-13 LAB — CLOSTRIDIUM DIFFICILE BY PCR: Toxigenic C. Difficile by PCR: POSITIVE — AB

## 2014-12-13 LAB — ETHANOL: Alcohol, Ethyl (B): <5 mg/dL (ref <5)

## 2014-12-13 MED ORDER — LEVETIRACETAM 500 MG PO TABS
1000.0000 mg | ORAL_TABLET | Freq: Two times a day (BID) | ORAL | Status: DC
  Administered 2014-12-13 – 2014-12-15 (×6): 1000 mg via ORAL
  Filled 2014-12-13 (×7): qty 2

## 2014-12-13 MED ORDER — CIPROFLOXACIN HCL 500 MG PO TABS
500.0000 mg | ORAL_TABLET | Freq: Two times a day (BID) | ORAL | Status: DC
  Administered 2014-12-13: 500 mg via ORAL
  Filled 2014-12-13 (×3): qty 1

## 2014-12-13 MED ORDER — SACCHAROMYCES BOULARDII 250 MG PO CAPS
250.0000 mg | ORAL_CAPSULE | Freq: Two times a day (BID) | ORAL | Status: DC
  Administered 2014-12-13 – 2014-12-15 (×5): 250 mg via ORAL
  Filled 2014-12-13 (×6): qty 1

## 2014-12-13 MED ORDER — ONDANSETRON HCL 4 MG/2ML IJ SOLN
4.0000 mg | Freq: Four times a day (QID) | INTRAMUSCULAR | Status: DC | PRN
  Administered 2014-12-15: 4 mg via INTRAVENOUS
  Filled 2014-12-13: qty 2

## 2014-12-13 MED ORDER — ACETAMINOPHEN 325 MG PO TABS
650.0000 mg | ORAL_TABLET | Freq: Four times a day (QID) | ORAL | Status: DC | PRN
  Administered 2014-12-13: 650 mg via ORAL
  Filled 2014-12-13: qty 2

## 2014-12-13 MED ORDER — SODIUM CHLORIDE 0.9 % IV SOLN
INTRAVENOUS | Status: DC
  Administered 2014-12-13 – 2014-12-14 (×3): via INTRAVENOUS

## 2014-12-13 MED ORDER — KETOROLAC TROMETHAMINE 30 MG/ML IJ SOLN
30.0000 mg | Freq: Four times a day (QID) | INTRAMUSCULAR | Status: DC | PRN
  Administered 2014-12-13 (×2): 30 mg via INTRAVENOUS
  Filled 2014-12-13 (×2): qty 1

## 2014-12-13 MED ORDER — FAMOTIDINE IN NACL 20-0.9 MG/50ML-% IV SOLN
20.0000 mg | Freq: Two times a day (BID) | INTRAVENOUS | Status: DC
  Administered 2014-12-13 – 2014-12-15 (×6): 20 mg via INTRAVENOUS
  Filled 2014-12-13 (×7): qty 50

## 2014-12-13 MED ORDER — HYDROCORTISONE ACE-PRAMOXINE 1-1 % RE FOAM
1.0000 | Freq: Two times a day (BID) | RECTAL | Status: DC
  Administered 2014-12-13 – 2014-12-15 (×4): 1 via RECTAL
  Filled 2014-12-13: qty 10

## 2014-12-13 MED ORDER — METRONIDAZOLE IN NACL 5-0.79 MG/ML-% IV SOLN
500.0000 mg | Freq: Three times a day (TID) | INTRAVENOUS | Status: DC
  Administered 2014-12-13 – 2014-12-15 (×8): 500 mg via INTRAVENOUS
  Filled 2014-12-13 (×10): qty 100

## 2014-12-13 MED ORDER — HYDROMORPHONE HCL 1 MG/ML IJ SOLN
1.0000 mg | INTRAMUSCULAR | Status: DC | PRN
  Administered 2014-12-13 – 2014-12-15 (×15): 1 mg via INTRAVENOUS
  Filled 2014-12-13 (×14): qty 1

## 2014-12-13 MED ORDER — ONDANSETRON HCL 4 MG/2ML IJ SOLN
4.0000 mg | Freq: Four times a day (QID) | INTRAMUSCULAR | Status: DC | PRN
  Administered 2014-12-13: 4 mg via INTRAVENOUS
  Filled 2014-12-13: qty 2

## 2014-12-13 MED ORDER — MIRTAZAPINE 30 MG PO TABS
30.0000 mg | ORAL_TABLET | Freq: Every evening | ORAL | Status: DC | PRN
  Filled 2014-12-13: qty 1

## 2014-12-13 MED ORDER — CHLORHEXIDINE GLUCONATE 0.12 % MT SOLN
15.0000 mL | Freq: Two times a day (BID) | OROMUCOSAL | Status: DC
  Administered 2014-12-13 – 2014-12-15 (×4): 15 mL via OROMUCOSAL
  Filled 2014-12-13 (×6): qty 15

## 2014-12-13 MED ORDER — ACETAMINOPHEN 650 MG RE SUPP: 650.0000 mg | Freq: Four times a day (QID) | RECTAL | Status: DC | PRN

## 2014-12-13 MED ORDER — PROMETHAZINE HCL 25 MG/ML IJ SOLN
25.0000 mg | Freq: Once | INTRAMUSCULAR | Status: AC
  Administered 2014-12-13: 25 mg via INTRAVENOUS
  Filled 2014-12-13: qty 1

## 2014-12-13 MED ORDER — POTASSIUM CHLORIDE 10 MEQ/100ML IV SOLN
10.0000 meq | INTRAVENOUS | Status: AC
  Administered 2014-12-13 (×2): 10 meq via INTRAVENOUS
  Filled 2014-12-13 (×2): qty 100

## 2014-12-13 MED ORDER — HYDROMORPHONE HCL 1 MG/ML IJ SOLN
INTRAMUSCULAR | Status: AC
  Filled 2014-12-13: qty 1

## 2014-12-13 MED ORDER — DICYCLOMINE HCL 20 MG PO TABS
20.0000 mg | ORAL_TABLET | Freq: Two times a day (BID) | ORAL | Status: DC
  Administered 2014-12-13 – 2014-12-15 (×5): 20 mg via ORAL
  Filled 2014-12-13 (×6): qty 1

## 2014-12-13 MED ORDER — ONDANSETRON HCL 4 MG PO TABS: 4.0000 mg | ORAL_TABLET | Freq: Four times a day (QID) | ORAL | Status: DC | PRN

## 2014-12-13 MED ORDER — PROMETHAZINE HCL 25 MG/ML IJ SOLN: 25.0000 mg | Freq: Four times a day (QID) | INTRAMUSCULAR | Status: DC | PRN

## 2014-12-13 NOTE — Progress Notes (Addendum)
CRITICAL VALUE ALERT  Critical value received:  c-diff positive  Date of notification:  12-13-14  Time of notification:  9892  Critical value read back: yes  Nurse who received alert:  Henrietta Dine  MD notified (1st page):  Dr. Charlies Silvers  Time of first page:  1122  MD notified (2nd page):  Time of second page:  Responding MD:  Dr. Charlies Silvers  Time MD responded:  1125

## 2014-12-13 NOTE — Consult Note (Signed)
Consultation  Referring Provider:  [facility]    Primary Care Physician:  Angelica Chessman, MD Primary Gastroenterologist:  [name], MD      Reason for Consultation:  colitis          HPI:   Kristen Roberson is a 39 y.o. female with a history of chronic abdominal pain who we evaluated in 2015 for GI bleeding on two occasions. Colonoscopy for low volume hematochezia was normal March 2015. EGD for melena May 2015 revealed a Mallory Weiss tear.  Patient presented to ED 12/08/14 with rectal pain and bleeding which EDP felt was secondary to hemorrhoids. A CTscan with contrast suggested mild diffuse colonic wall thickening. Patient was discharged from ED with hemorrhoid med, Cipro and Flagyl. She returned to ED by EMS yesterday, this time for acute lower abdominal pain. Apparently patient hadn't filled previous ED prescriptions due to cost. Still having loose, bloody stools. No recent antibiotics. She stopped an antidepressant several weeks ago but no other recent med changes.   CTscan abd/pelvis 09/17/13  - no acute GI abnormalities 10/05/13  - no acute GI abnormalities 10/28/13 - no acute GI abnormalties 12/17/13 - no acute GI abnormalities 04/19/14 - no acute GI abnormalities 05/28/14 - no acute GI abnormalities  Past Medical History  Diagnosis Date  . Proctitis   . Cysts of both ovaries   . Anemia   . Anxiety   . Blood transfusion without reported diagnosis   . Depression   . Fatty liver 10/05/13  . Cardiac arrest   . Seizures     Past Surgical History  Procedure Laterality Date  . Ovarian cyst removal    . Laparoscopy N/A 09/28/2013    Procedure: LAPAROSCOPY OPERATIVE;  Surgeon: Terrance Mass, MD;  Location: [facility];  Service: Gynecology;  Laterality: N/A;  . Laparoscopic appendectomy Right 09/28/2013    Procedure: APPENDECTOMY LAPAROSCOPIC;  Surgeon: Terrance Mass, MD;  Location: [facility];  Service: Gynecology;  Laterality: Right;  . Salpingoophorectomy Right 09/28/2013      Procedure: SALPINGO OOPHORECTOMY;  Surgeon: Terrance Mass, MD;  Location: [facility];  Service: Gynecology;  Laterality: Right;  . Colonoscopy N/A 09/30/2013    Procedure: COLONOSCOPY;  Surgeon: Lafayette Dragon, MD;  Location: [facility];  Service: Endoscopy;  Laterality: N/A;  . Esophagogastroduodenoscopy N/A 11/23/2013    Procedure: ESOPHAGOGASTRODUODENOSCOPY (EGD);  Surgeon: Jerene Bears, MD;  Location: [facility];  Service: Endoscopy;  Laterality: N/A;  . Appendectomy    . Left and right heart catheterization with coronary angiogram N/A 02/23/2014    Procedure: LEFT AND RIGHT HEART CATHETERIZATION WITH CORONARY ANGIOGRAM;  Surgeon: Leonie Man, MD;  Location: [facility];  Service: Cardiovascular;  Laterality: N/A;    Family History  Problem Relation Age of Onset  . Diabetes Mother   . Hyperlipidemia Mother   . Stroke Mother   . Diabetes Father      History  Substance Use Topics  . Smoking status: Never Smoker   . Smokeless tobacco: Never Used  . Alcohol Use: Yes    Prior to Admission medications   Medication Sig Start Date End Date Taking? Authorizing Provider  ciprofloxacin (CIPRO) 500 MG tablet Take 1 tablet (500 mg total) by mouth every 12 (twelve) hours. 12/09/14  Yes Waynetta Pean, PA-C  dicyclomine (BENTYL) 20 MG tablet Take 1 tablet (20 mg total) by mouth 2 (two) times daily. 12/09/14  Yes Waynetta Pean, PA-C  hydrocortisone-pramoxine (PROCTOFOAM [facility]) rectal  foam Place 1 applicator rectally 2 (two) times daily. 12/09/14  Yes Waynetta Pean, PA-C  ibuprofen (ADVIL,MOTRIN) 200 MG tablet Take 400 mg by mouth every 6 (six) hours as needed for moderate pain (pain).   Yes Historical Provider, MD  levETIRAcetam (KEPPRA) 1000 MG tablet Take 1 tablet (1,000 mg total) by mouth daily. For seizure activities Patient taking differently: Take 1,000 mg by mouth 2 (two) times daily. For seizure activities 07/04/14  Yes Encarnacion Slates, NP  metroNIDAZOLE (FLAGYL) 500 MG tablet Take 1  tablet (500 mg total) by mouth 2 (two) times daily. 12/09/14  Yes Waynetta Pean, PA-C  mirtazapine (REMERON) 30 MG tablet Take 1 tablet (30 mg total) by mouth at bedtime. For depression/sleep Patient taking differently: Take 30 mg by mouth at bedtime as needed (sleep). For depression/sleep 07/04/14  Yes Encarnacion Slates, NP  ondansetron (ZOFRAN ODT) 4 MG disintegrating tablet Take 1 tablet (4 mg total) by mouth every 8 (eight) hours as needed for nausea or vomiting. 12/09/14  Yes Waynetta Pean, PA-C  Tetrahydrozoline HCl (VISINE OP) Apply 1-2 drops to eye daily as needed (dry eyes).   Yes Historical Provider, MD  carvedilol (COREG) 3.125 MG tablet Take 0.5 tablets (1.5625 mg total) by mouth 2 (two) times daily with a meal. For high blood pressure Patient not taking: Reported on 11/28/2014 07/04/14   Encarnacion Slates, NP  folic acid (FOLVITE) 1 MG tablet Take 1 tablet (1 mg total) by mouth daily. For low folate Patient not taking: Reported on 11/26/2014 07/04/14   Encarnacion Slates, NP    Current Facility-Administered Medications  Medication Dose Route Frequency Provider Last Rate Last Dose  . 0.9 %  sodium chloride infusion   Intravenous Continuous Lavina Hamman, MD 100 mL/hr at 12/13/14 0209    . acetaminophen (TYLENOL) tablet 650 mg  650 mg Oral Q6H PRN Lavina Hamman, MD   650 mg at 12/13/14 0550   Or  . acetaminophen (TYLENOL) suppository 650 mg  650 mg Rectal Q6H PRN Lavina Hamman, MD      . ciprofloxacin (CIPRO) tablet 500 mg  500 mg Oral BID Lavina Hamman, MD   500 mg at 12/13/14 0835  . dicyclomine (BENTYL) tablet 20 mg  20 mg Oral BID Lavina Hamman, MD      . famotidine (PEPCID) IVPB 20 mg premix  20 mg Intravenous Q12H Lavina Hamman, MD   20 mg at 12/13/14 0212  . HYDROmorphone (DILAUDID) injection 1 mg  1 mg Intravenous Q3H PRN Robbie Lis, MD      . ketorolac (TORADOL) 30 MG/ML injection 30 mg  30 mg Intravenous Q6H PRN Lavina Hamman, MD   30 mg at 12/13/14 0835  . levETIRAcetam (KEPPRA)  tablet 1,000 mg  1,000 mg Oral BID Lavina Hamman, MD   1,000 mg at 12/13/14 0211  . metroNIDAZOLE (FLAGYL) IVPB 500 mg  500 mg Intravenous 3 times per day Lavina Hamman, MD   500 mg at 12/13/14 0547  . mirtazapine (REMERON) tablet 30 mg  30 mg Oral QHS PRN Lavina Hamman, MD      . potassium chloride 10 mEq in 100 mL IVPB  10 mEq Intravenous Q1 Hr x 2 Lavina Hamman, MD   10 mEq at 12/13/14 0840  . promethazine (PHENERGAN) injection 25 mg  25 mg Intravenous Q6H PRN Robbie Lis, MD        Allergies as of 12/12/2014 -  Review Complete 12/12/2014  Allergen Reaction Noted  . Morphine and related Anaphylaxis 09/17/2013  . Tramadol Other (See Comments) 09/17/2013  . Penicillins Other (See Comments) 09/17/2013    Review of Systems:    All systems reviewed and negative except where noted in HPI.    Physical Exam:  Vital signs in last 24 hours: Temp:  [98 F (36.7 C)-98.2 F (36.8 C)] 98 F (36.7 C) (05/26 0540) Pulse Rate:  [64-95] 95 (05/26 0540) Resp:  [16-22] 20 (05/26 0540) BP: (119-152)/(68-97) 125/80 mmHg (05/26 0540) SpO2:  [95 %-100 %] 100 % (05/26 0540) Weight:  [130 lb (58.968 kg)] 130 lb (58.968 kg) (05/26 0540) Last BM Date: 12/13/14 General:   Pleasant white female in NAD Head:  Normocephalic and atraumatic. Eyes:   No icterus.   Conjunctiva pink. Ears:  Normal auditory acuity. Neck:  Supple; no masses felt Lungs:  Respirations even and unlabored. Lungs clear to auscultation bilaterally.   No wheezes, crackles, or rhonchi.  Heart:  Regular rate and rhythm Abdomen:  Soft, nondistended, nontender. Normal bowel sounds. No appreciable masses or hepatomegaly.  Rectal:  Not performed.  Msk:  Symmetrical without gross deformities.  Extremities:  Without edema. Neurologic:  Alert and  oriented x4;  grossly normal neurologically. Skin:  Intact without significant lesions or rashes. Cervical Nodes:  No significant cervical adenopathy. Psych:  Alert and cooperative. Normal  affect.  LAB RESULTS:  Recent Labs  12/12/14 1536 12/13/14 0545  WBC 3.6* 3.4*  HGB 10.6* 9.7*  HCT 31.4* 30.1*  PLT 71* 47*   BMET  Recent Labs  12/12/14 1536 12/13/14 0545  NA 136 138  K 3.3* 2.8*  CL 98* 103  CO2 22 25  GLUCOSE 123* 112*  BUN <5* <5*  CREATININE 0.38* 0.36*  CALCIUM 7.9* 7.1*   LFT  Recent Labs  12/13/14 0545  PROT 6.2*  ALBUMIN 3.6  AST 70*  ALT 13*  ALKPHOS 115  BILITOT 2.6*   PT/INR  Recent Labs  12/13/14 0545  LABPROT 14.9  INR 1.15     PREVIOUS ENDOSCOPIES:            EGD May 2015 for melena. Findings: Mallory Weiss tear and gastritis. Colonoscopy March 2015 for hematochezia. Findings: normal. Random biopsies: normal.     Impression / Plan:   96. 39 year old female with chronic abdominal complaints. 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 Colonoscopy with biopsies for rectal bleeding last year were negative. With exception of CTscan done five days ago, multiple CTscans over the last 1.5 years have been unrevealing.   2. Mild diffuse colitis by CTscan. Rule out infectious etiology. Will add GI pathogen panel to C-diff. If stool studies negative will consider flex sigmoidoscopy  3. ETOH abuse, ETOH levels ranging from 340 to 430 on several occasions over last two years. .    4.  Hepatosteatosis by CTscan. She has chronic transaminitis. ETOH likely contributing to steatohepatitis. Never had HBV, HCV checked so will do that. Check ANA. She hasn't drank in several days but prior to that states maximum intake was two ETOH drinks a few times a week.   5. Panctyopenia, progressive thrombocytopenia since 12/08/14 (139 >> 71 > 47). No splenomegaly mentioned on CTscan. Bone marrow suppression from ETOH?   6. Hypokalemia, replacement per admitting team   Addendum: Just spoke with RN, C-diff is positive. Will continue IV flagyl (complains of some nausea so refrain from  PO Flagyl for now).  Will discontinue Cipro.   Thanks   LOS: 1 day   Tye Savoy  12/13/2014, 9:30 AM

## 2014-12-13 NOTE — Progress Notes (Signed)
Patient ID: Kristen Roberson, female   DOB: 03-09-1976, 39 y.o.   MRN: [id number] [facility] PROGRESS NOTE  Kristen Roberson TMA:[id number] DOB: 14-Feb-1976 DOA: 12/12/2014 PCP: Angelica Chessman, MD  Brief narrative:    39 y.o. female with a history of alcohol abuse who presented to [facility] ED with lower abdominal pain started about 1 day prior to this admission, 10/10 in intensity, radiating to both sides ob abdomen, associated nausea and vomiting. No fevers or chills. She did have loose stools and some rectal bleed. CTscan on 12/08/14 showed mild diffuse colonic wall thickening. At that time pt was sent home from ED with prescriptions for cipro and flagyl but she never filled it.  Barrier to discharge: still with pain and nausea, uncontrolled. Now we know she has C.diff colitis. Continue IV flagyl. Home probably by 12/15/2014.  Assessment/Plan:    Principal Problem: C. difficile colitis / Nausea, vomiting and diarrhea - C.diff on this admission positive. - Continue supportive care with IV fluids, antiemetics and analgesia as needed - Enteric precautions  - Continue florastor BID  Active Problems: Rectal bleed / Anemia of chronic disease - Likely hemorrhoidal as well as bone marrow suppression from alcohol abuse - Hemoglobin is 9.7 - Follow up CBC daily  Thrombocytopenia / Leukopenia - From bone marrow suppression from alcohol abuse - Platelets are 47 today, WBC count 3.4 - F/U CBC daily  Alcohol abuse - Alcohol level not obtained on this admission. Will check it today.  Hypokalemia - Secondary to GI losses - Supplemented  Seizures - Continue Keppra   DVT Prophylaxis  - SCD's bilaterally    Code Status: Full.  Family Communication:  plan of care discussed with the patient Disposition Plan: Home once PO intake improves and pain and nausea under better control.   IV access:  Peripheral IV  Procedures and diagnostic studies:    No results found.  Medical Consultants:   Gastroenterology  Other Consultants:  None   IAnti-Infectives:   Flagyl 12/13/2014 -->   Leisa Lenz, MD  [facility] Pager [phone]  Time spent in minutes: 25 minutes  If 7PM-7AM, please contact night-coverage [email or web address] Password [email or web address] 12/13/2014, 1:31 PM   LOS: 1 day    HPI/Subjective: No acute overnight events. Patient reports lower abdominal pain, 8/10 in intensity, nausea but no vomiting.  Objective: Filed Vitals:   12/12/14 2355 12/13/14 0000 12/13/14 0037 12/13/14 0540  BP: 139/91 140/93 141/76 125/80  Pulse: 83 74 65 95  Temp:   98.1 F (36.7 C) 98 F (36.7 C)  TempSrc:   Oral Oral  Resp: 16  22 20   Height:    5\' 6"  (1.676 m)  Weight:    58.968 kg (130 lb)  SpO2: 96% 97% 100% 100%    Intake/Output Summary (Last 24 hours) at 12/13/14 1331 Last data filed at 12/13/14 0551  Gross per 24 hour  Intake  368.2 ml  Output      0 ml  Net  368.2 ml    Exam:   General:  Pt is alert, follows commands appropriately, not in acute distress  Cardiovascular: Regular rate and rhythm, S1/S2, no murmurs  Respiratory: Clear to auscultation bilaterally, no wheezing, no crackles, no rhonchi  Abdomen: Soft, non tender, non distended, bowel sounds present  Extremities: No edema, pulses DP and PT palpable bilaterally  Neuro: Grossly nonfocal  Data Reviewed: Basic Metabolic Panel:  Recent Labs Lab 12/08/14 1917 12/12/14 1536 12/13/14 0545  NA 141 136 138  K  2.8* 3.3* 2.8*  CL 102 98* 103  CO2 27 22 25   GLUCOSE 108* 123* 112*  BUN 6 <5* <5*  CREATININE <0.30* 0.38* 0.36*  CALCIUM 8.1* 7.9* 7.1*   Liver Function Tests:  Recent Labs Lab 12/08/14 1917 12/12/14 1536 12/13/14 0545  AST 75* 79* 70*  ALT 19 18 13*  ALKPHOS 141* 132* 115  BILITOT 0.5 2.3* 2.6*  PROT 7.5 7.1 6.2*  ALBUMIN 4.2 4.1 3.6    Recent Labs Lab 12/08/14 1917  LIPASE 30   No results for input(s): AMMONIA in the last 168 hours. CBC:  Recent Labs Lab  12/08/14 2020 12/12/14 1536 12/13/14 0545  WBC 3.5* 3.6* 3.4*  NEUTROABS 1.8 2.6  --   HGB 11.0* 10.6* 9.7*  HCT 33.3* 31.4* 30.1*  MCV 92.5 90.8 94.4  PLT 130* 71* 47*   Cardiac Enzymes: No results for input(s): CKTOTAL, CKMB, CKMBINDEX, TROPONINI in the last 168 hours. BNP: Invalid input(s): POCBNP CBG: No results for input(s): GLUCAP in the last 168 hours.  Recent Results (from the past 240 hour(s))  Clostridium Difficile by PCR     Status: Abnormal   Collection Time: 12/13/14  9:36 AM  Result Value Ref Range Status   C difficile by pcr POSITIVE (A) NEGATIVE Final    Comment: CRITICAL RESULT CALLED TO, READ BACK BY AND VERIFIED WITH: RYAN,K @ 1120 ON [id number] BY POTEAT,S      Scheduled Meds: . chlorhexidine  15 mL Mouth/Throat BID  . dicyclomine  20 mg Oral BID  . famotidine (PEPCID) IV  20 mg Intravenous Q12H  . HYDROmorphone      . levETIRAcetam  1,000 mg Oral BID  . metronidazole  500 mg Intravenous 3 times per day  . saccharomyces boulardii  250 mg Oral BID   Continuous Infusions: . sodium chloride 100 mL/hr at 12/13/14 0209

## 2014-12-13 NOTE — Progress Notes (Signed)
ANTIBIOTIC CONSULT NOTE - INITIAL  Pharmacy Consult for CIpro Indication: Intra-abdominal infection  Allergies  Allergen Reactions  . Morphine And Related Anaphylaxis     Tolerated hydromorphone on 11/25/13.   . Tramadol Other (See Comments)    Seizures   . Penicillins Other (See Comments)    Unknown childhood reaction.    Patient Measurements:   Adjusted Body Weight:   Vital Signs: Temp: 98.1 F (36.7 C) (05/26 0037) Temp Source: Oral (05/26 0037) BP: 141/76 mmHg (05/26 0037) Pulse Rate: 65 (05/26 0037) Intake/Output from previous day:   Intake/Output from this shift:    Labs:  Recent Labs  12/12/14 1536  WBC 3.6*  HGB 10.6*  PLT 71*  CREATININE 0.38*   CrCl cannot be calculated (Unknown ideal weight.). No results for input(s): VANCOTROUGH, VANCOPEAK, VANCORANDOM, GENTTROUGH, GENTPEAK, GENTRANDOM, TOBRATROUGH, TOBRAPEAK, TOBRARND, AMIKACINPEAK, AMIKACINTROU, AMIKACIN in the last 72 hours.   Microbiology: No results found for this or any previous visit (from the past 720 hour(s)).  Medical History: Past Medical History  Diagnosis Date  . Proctitis   . Cysts of both ovaries   . Anemia   . Anxiety   . Blood transfusion without reported diagnosis   . Depression   . Fatty liver 10/05/13  . Cardiac arrest   . Seizures     Medications:  Anti-infectives    Start     Dose/Rate Route Frequency Ordered Stop   12/13/14 0800  ciprofloxacin (CIPRO) tablet 500 mg     500 mg Oral 2 times daily 12/13/14 0518     12/13/14 0145  metroNIDAZOLE (FLAGYL) IVPB 500 mg     500 mg 100 mL/hr over 60 Minutes Intravenous 3 times per day 12/13/14 0129     12/12/14 1730  ciprofloxacin (CIPRO) tablet 500 mg     500 mg Oral  Once 12/12/14 1719 12/12/14 1737   12/12/14 1730  metroNIDAZOLE (FLAGYL) tablet 500 mg     500 mg Oral  Once 12/12/14 1720 12/12/14 1737     Assessment: Patient with colitis.  First dose of antibiotics already given in ED, was PO dose.  Goal of  Therapy:  Cipro dosed based on patient weight and renal function   Plan:  Follow up culture results  Cipro 500mg  po q12hr  Tyler Deis, Shekela Goodridge Crowford 12/13/2014,5:21 AM

## 2014-12-14 DIAGNOSIS — D638 Anemia in other chronic diseases classified elsewhere: Secondary | ICD-10-CM

## 2014-12-14 LAB — CBC
HEMATOCRIT: 26.4 % — AB (ref 36.0–46.0)
Hemoglobin: 8.4 g/dL — ABNORMAL LOW (ref 12.0–15.0)
MCH: 30.3 pg (ref 26.0–34.0)
MCHC: 31.8 g/dL (ref 30.0–36.0)
MCV: 95.3 fL (ref 78.0–100.0)
Platelets: 50 10*3/uL — ABNORMAL LOW (ref 150–400)
RBC: 2.77 MIL/uL — ABNORMAL LOW (ref 3.87–5.11)
RDW: 17.5 % — ABNORMAL HIGH (ref 11.5–15.5)
WBC: 3.1 10*3/uL — ABNORMAL LOW (ref 4.0–10.5)

## 2014-12-14 LAB — BASIC METABOLIC PANEL
Anion gap: 10 (ref 5–15)
CALCIUM: 6.4 mg/dL — AB (ref 8.9–10.3)
CO2: 25 mmol/L (ref 22–32)
CREATININE: 0.39 mg/dL — AB (ref 0.44–1.00)
Chloride: 103 mmol/L (ref 101–111)
GFR calc Af Amer: >60 mL/min (ref >60)
GLUCOSE: 88 mg/dL (ref 65–99)
POTASSIUM: 2.9 mmol/L — AB (ref 3.5–5.1)
Sodium: 138 mmol/L (ref 135–145)

## 2014-12-14 LAB — HEPATITIS B SURFACE ANTIGEN: HEP B S AG: NEGATIVE

## 2014-12-14 LAB — HEPATITIS C ANTIBODY: HCV AB: NEGATIVE

## 2014-12-14 LAB — ANTINUCLEAR ANTIBODIES, IFA: ANTINUCLEAR ANTIBODIES, IFA: NEGATIVE

## 2014-12-14 MED ORDER — POTASSIUM CHLORIDE IN NACL 20-0.9 MEQ/L-% IV SOLN
INTRAVENOUS | Status: DC
  Administered 2014-12-14 – 2014-12-15 (×2): 50 mL/h via INTRAVENOUS
  Filled 2014-12-14 (×2): qty 1000

## 2014-12-14 MED ORDER — SODIUM CHLORIDE 0.9 % IV SOLN: INTRAVENOUS | Status: DC

## 2014-12-14 MED ORDER — POTASSIUM CHLORIDE CRYS ER 20 MEQ PO TBCR
40.0000 meq | EXTENDED_RELEASE_TABLET | Freq: Once | ORAL | Status: AC
  Administered 2014-12-14: 40 meq via ORAL
  Filled 2014-12-14: qty 2

## 2014-12-14 MED ORDER — SODIUM CHLORIDE 0.9 % IV SOLN
1.0000 g | Freq: Once | INTRAVENOUS | Status: AC
  Administered 2014-12-14: 1 g via INTRAVENOUS
  Filled 2014-12-14: qty 10

## 2014-12-14 MED ORDER — CALCIUM CARBONATE-VITAMIN D 500-200 MG-UNIT PO TABS
1.0000 | ORAL_TABLET | Freq: Every day | ORAL | Status: DC
  Administered 2014-12-15: 1 via ORAL
  Filled 2014-12-14 (×2): qty 1

## 2014-12-14 NOTE — Plan of Care (Signed)
Problem: Phase I Progression Outcomes Goal: Other Phase I Outcomes/Goals Outcome: Progressing Advance from NPO to clear liquids

## 2014-12-14 NOTE — Progress Notes (Addendum)
CRITICAL VALUE ALERT  Critical value received:  Calcium 6.4  Will give calcium gluconate and calcium supplementation.  Leisa Lenz

## 2014-12-14 NOTE — Progress Notes (Signed)
Patient ID: Kristen Roberson, female   DOB: 12-Nov-1975, 39 y.o.   MRN: [id number] [facility] PROGRESS NOTE  MACI EICKHOLT ERD:[id number] DOB: 1975/08/09 DOA: 12/12/2014 PCP: Angelica Chessman, MD  Brief narrative:    39 y.o. female with a history of alcohol abuse who presented to [facility] ED with lower abdominal pain started about 1 day prior to this admission, 10/10 in intensity, radiating to both sides ob abdomen, associated nausea and vomiting. No fevers or chills. She did have loose stools and some rectal bleed. CTscan on 12/08/14 showed mild diffuse colonic wall thickening. At that time pt was sent home from ED with prescriptions for cipro and flagyl but she never filled it.  Barrier to discharge: Advanced diet to clear liquid today. We will see if she tolerates it well. If she does then we will advance to regular for lunch and dinner and hopefully she will be okay for discharge tomorrow. She is on IV Flagyl for new diagnosis of C. difficile colitis. Anticipated discharge 12/15/2014.   Assessment/Plan:    Principal Problem: C. difficile colitis / Nausea, vomiting and diarrhea - C.diff on this admission positive. - Patient had one bowel movement in past 24 hours. - We'll continue low rate IV fluids for hydration. She is currently on clear liquid diet. - Continue IV Flagyl. Continue antiemetics and analgesia as needed for nausea, vomiting and pain control. - Continue enteric precautions - Continue florastor supplementation  Active Problems: Rectal bleed / Anemia of chronic disease - Anemia likely combination of bone marrow suppression from history of alcohol abuse as well as hemorrhoidal bleed. - Hemoglobin is 8.4 this morning. Drop from 9.7 in past 24 hours. - Continue to monitor for bleeding. - Follow CBC daily.  Thrombocytopenia / Leukopenia - Likely secondary to bone marrow suppression from history of alcohol abuse - Platelets are stable at 50. White blood cell count is 3.1. -  Monitor CBC daily.  Alcohol abuse - Alcohol level not obtained on this admissionWe obtained ethanol level the following day which was within normal limits.   Hypokalemia - Secondary to GI losses - potassium being supplemented with IV fluids. She will also receive 1 more dose of potassium 40 mEq one-time dose by mouth.   Seizures - Continue Keppra. - No reports of seizures.   DVT Prophylaxis  - SCD's bilaterally due to risk of bleeding   Code Status: Full.  Family Communication: plan of care discussed with the patient Disposition Plan: Home once tolerates regular diet    IV access:  Peripheral IV  Procedures and diagnostic studies:    No results found.  Medical Consultants:  Gastroenterology  Other Consultants:  None   IAnti-Infectives:   Flagyl 12/13/2014 -->   Leisa Lenz, MD  [facility] Pager [phone]  Time spent in minutes: 25 minutes  If 7PM-7AM, please contact night-coverage [email or web address] Password [email or web address] 12/14/2014, 10:28 AM   LOS: 2 days    HPI/Subjective: No acute overnight events. Patient reports pain in lower abdomen 5/10 in intensity. No nausea.   Objective: Filed Vitals:   12/13/14 0540 12/13/14 1450 12/13/14 2230 12/14/14 0459  BP: 125/80 131/83 129/85 113/76  Pulse: 95 65 80 79  Temp: 98 F (36.7 C) 98.2 F (36.8 C) 98 F (36.7 C) 98 F (36.7 C)  TempSrc: Oral Oral Oral Oral  Resp: 20 20 20 16   Height: 5\' 6"  (1.676 m)     Weight: 58.968 kg (130 lb)     SpO2: 100% 100% 99% 98%  No intake or output data in the 24 hours ending 12/14/14 1028  Exam:   General:  Pt is alert, not in acute distress  Cardiovascular: Regular rate and rhythm, S1/S2 (+)  Respiratory: no wheezing, no crackles, no rhonchi  Abdomen: tender in lower abdomen, no guarding, (+) BS  Extremities: No edema, pulses DP and PT palpable bilaterally  Neuro: Nonfocal  Data Reviewed: Basic Metabolic Panel:  Recent Labs Lab 12/08/14 1917  12/12/14 1536 12/13/14 0545 12/14/14 0912  NA 141 136 138 138  K 2.8* 3.3* 2.8* 2.9*  CL 102 98* 103 103  CO2 27 22 25 25   GLUCOSE 108* 123* 112* 88  BUN 6 <5* <5* <5*  CREATININE <0.30* 0.38* 0.36* 0.39*  CALCIUM 8.1* 7.9* 7.1* 6.4*   Liver Function Tests:  Recent Labs Lab 12/08/14 1917 12/12/14 1536 12/13/14 0545  AST 75* 79* 70*  ALT 19 18 13*  ALKPHOS 141* 132* 115  BILITOT 0.5 2.3* 2.6*  PROT 7.5 7.1 6.2*  ALBUMIN 4.2 4.1 3.6    Recent Labs Lab 12/08/14 1917  LIPASE 30   No results for input(s): AMMONIA in the last 168 hours. CBC:  Recent Labs Lab 12/08/14 2020 12/12/14 1536 12/13/14 0545 12/14/14 0912  WBC 3.5* 3.6* 3.4* 3.1*  NEUTROABS 1.8 2.6  --   --   HGB 11.0* 10.6* 9.7* 8.4*  HCT 33.3* 31.4* 30.1* 26.4*  MCV 92.5 90.8 94.4 95.3  PLT 130* 71* 47* 50*   Cardiac Enzymes: No results for input(s): CKTOTAL, CKMB, CKMBINDEX, TROPONINI in the last 168 hours. BNP: Invalid input(s): POCBNP CBG: No results for input(s): GLUCAP in the last 168 hours.  Recent Results (from the past 240 hour(s))  Clostridium Difficile by PCR     Status: Abnormal   Collection Time: 12/13/14  9:36 AM  Result Value Ref Range Status   C difficile by pcr POSITIVE (A) NEGATIVE Final     Scheduled Meds: . calcium gluconate  1 g Intravenous Once  .  calcium-vitamin D  1 tablet Oral Q breakfast  . dicyclomine  20 mg Oral BID  . famotidine (PEPCID) IV  20 mg Intravenous Q12H  . hydrocortisone-pramoxi  1 applicator Rectal BID  . levETIRAcetam  1,000 mg Oral BID  . metronidazole  500 mg Intravenous 3 times per day  . potassium chloride  40 mEq Oral Once  . saccharomyces boulardii  250 mg Oral BID   Continuous Infusions: . sodium chloride 0.9 % 1,000 mL with potassium chloride 20 mEq infusion

## 2014-12-15 LAB — BASIC METABOLIC PANEL
Anion gap: 9 (ref 5–15)
CO2: 29 mmol/L (ref 22–32)
Calcium: 7.1 mg/dL — ABNORMAL LOW (ref 8.9–10.3)
Chloride: 99 mmol/L — ABNORMAL LOW (ref 101–111)
Creatinine, Ser: 0.45 mg/dL (ref 0.44–1.00)
GFR calc non Af Amer: >60 mL/min (ref >60)
GLUCOSE: 104 mg/dL — AB (ref 65–99)
Potassium: 3.3 mmol/L — ABNORMAL LOW (ref 3.5–5.1)
Sodium: 137 mmol/L (ref 135–145)

## 2014-12-15 LAB — CBC
HCT: 27.3 % — ABNORMAL LOW (ref 36.0–46.0)
Hemoglobin: 8.9 g/dL — ABNORMAL LOW (ref 12.0–15.0)
MCH: 30.8 pg (ref 26.0–34.0)
MCHC: 32.6 g/dL (ref 30.0–36.0)
MCV: 94.5 fL (ref 78.0–100.0)
Platelets: 65 10*3/uL — ABNORMAL LOW (ref 150–400)
RBC: 2.89 MIL/uL — ABNORMAL LOW (ref 3.87–5.11)
RDW: 17.4 % — AB (ref 11.5–15.5)
WBC: 3.5 10*3/uL — AB (ref 4.0–10.5)

## 2014-12-15 MED ORDER — SACCHAROMYCES BOULARDII 250 MG PO CAPS: 250.0000 mg | ORAL_CAPSULE | Freq: Two times a day (BID) | ORAL | Status: DC

## 2014-12-15 MED ORDER — NAPHAZOLINE HCL 0.1 % OP SOLN
1.0000 [drp] | Freq: Four times a day (QID) | OPHTHALMIC | Status: DC | PRN
  Administered 2014-12-15: 1 [drp] via OPHTHALMIC
  Filled 2014-12-15: qty 15

## 2014-12-15 MED ORDER — CALCIUM CARBONATE-VITAMIN D 500-200 MG-UNIT PO TABS: 1.0000 | ORAL_TABLET | Freq: Every day | ORAL | Status: DC

## 2014-12-15 MED ORDER — METRONIDAZOLE 500 MG PO TABS: 500.0000 mg | ORAL_TABLET | Freq: Three times a day (TID) | ORAL | Status: DC

## 2014-12-15 MED ORDER — POTASSIUM CHLORIDE ER 20 MEQ PO TBCR
20.0000 meq | EXTENDED_RELEASE_TABLET | Freq: Every day | ORAL | Status: DC
Start: 2014-12-15 — End: 2015-07-20

## 2014-12-15 NOTE — Discharge Instructions (Signed)
Clostridium Difficile Infection °Clostridium difficile (C. difficile) is a bacteria found in the intestinal tract or colon. Under certain conditions, it causes diarrhea and sometimes severe disease. The severe form of the disease is known as pseudomembranous colitis (often called C. difficile colitis). This disease can damage the lining of the colon or cause the colon to become enlarged (toxic megacolon). °CAUSES °Your colon normally contains many different bacteria, including C. difficile. The balance of bacteria in your colon can change during illness. This is especially true when you take antibiotic medicine. Taking antibiotics may allow the C. difficile to grow, multiply excessively, and make a toxin that then causes illness. The elderly and people with certain medical conditions have a greater risk of getting C. difficile infections. °SYMPTOMS °· Watery diarrhea. °· Fever. °· Fatigue. °· Loss of appetite. °· Nausea. °· Abdominal swelling, pain, or tenderness. °· Dehydration. °DIAGNOSIS °Your symptoms may make your caregiver suspect a C. difficile infection, especially if you have used antibiotics in the preceding weeks. However, there are only 2 ways to know for certain whether you have a C. difficile infection: °· A lab test that finds the toxin in your stool. °· The specific appearance of an abnormality (pseudomembrane) in your colon. This can only be seen by doing a sigmoidoscopy or colonoscopy. These procedures involve passing an instrument through your rectum to look at the inside of your colon. °Your caregiver will help determine if these tests are necessary. °TREATMENT °· Most people are successfully treated with one of two specific antibiotics, usually given by mouth. Other antibiotics you are receiving are stopped if possible. °· Intravenous (IV) fluids and correction of electrolyte imbalance may be necessary. °· Rarely, surgery may be needed to remove the infected part of the intestines. °· Careful  hand washing by you and your caregivers is important to prevent the spread of infection. In the hospital, your caregivers may also put on gowns and gloves to prevent the spread of the C. difficile bacteria. Your room is also cleaned regularly with a solution containing bleach or a product that is known to kill C. difficile. °HOME CARE INSTRUCTIONS °· Drink enough fluids to keep your urine clear or pale yellow. Avoid milk, caffeine, and alcohol. °· Ask your caregiver for specific rehydration instructions. °· Try eating small, frequent meals rather than large meals. °· Take your antibiotics as directed. Finish them even if you start to feel better. °· Do not use medicines to slow diarrhea. This could delay healing or cause complications. °· Wash your hands thoroughly after using the bathroom and before preparing food. °· Make sure people who live with you wash their hands often, too. °· Carefully disinfect all surfaces with a product that contains chlorine bleach. °SEEK MEDICAL CARE IF: °· Diarrhea persists longer than expected or recurs after completing your course of antibiotic treatment for the C. difficile infection. °· You have trouble staying hydrated. °SEEK IMMEDIATE MEDICAL CARE IF: °· You develop a new fever. °· You have increasing abdominal pain or tenderness. °· There is blood in your stools, or your stools are dark black and tarry. °· You cannot hold down food or liquids. °MAKE SURE YOU: °· Understand these instructions. °· Will watch your condition. °· Will get help right away if you are not doing well or get worse. °Document Released: 04/15/2005 Document Revised: 11/20/2013 Document Reviewed: 12/12/2010 °ExitCare® Patient Information ©2015 [employer]. This information is not intended to replace advice given to you by your health care provider. Make sure you   discuss any questions you have with your health care provider. ° °

## 2014-12-15 NOTE — Discharge Summary (Signed)
Physician Discharge Summary  SIBLE STRALEY GGY:[id number] DOB: 1976/07/18 DOA: 12/12/2014  PCP: Angelica Chessman, MD  Admit date: 12/12/2014 Discharge date: 12/15/2014  Recommendations for Outpatient Follow-up:  1. Take the Flagyl for 12 more days on discharge for treatment of C. difficile colitis. 2. Take potassium for next 5 days on discharge as prescribed.  Discharge Diagnoses:  Principal Problem:   Colitis Active Problems:   Anxiety   Hematochezia   GAD (generalized anxiety disorder)   Hemorrhoid   C. difficile colitis   Alcohol abuse   Elevated liver enzymes    Discharge Condition: stable   Diet recommendation: as tolerated   History of present illness:  39 y.o. female with a history of alcohol abuse who presented to [facility] ED with lower abdominal pain started about 1 day prior to this admission, 10/10 in intensity, radiating to both sides ob abdomen, associated nausea and vomiting. No fevers or chills. She did have loose stools and some rectal bleed. CTscan on 12/08/14 showed mild diffuse colonic wall thickening. At that time pt was sent home from ED with prescriptions for cipro and flagyl but she never filled it. Patient was found to have C. difficile colitis during this admission basal stool analysis. She was placed on Flagyl from the time of the admission.  Hospital Course:   Assessment/Plan:    Principal Problem: C. difficile colitis / Nausea, vomiting and diarrhea - C.diff on this admission positive. - Diarrhea subsided. - Patient tolerated full liquid diet and she will have regular diet this morning. - We will continue Flagyl for 12 more days on discharge which would make it total of 2 week treatment for C. difficile colitis. - Continue florastor supplementation  Active Problems: Rectal bleed / Anemia of chronic disease - Anemia likely combination of bone marrow suppression from history of alcohol abuse as well as hemorrhoidal bleed. - Hemoglobin is stable, 8.9  this morning.  Thrombocytopenia / Leukopenia - Likely secondary to bone marrow suppression from history of alcohol abuse - Platelets have trended up since the admission, 47 up to 65 this morning. - White blood cell count is stable at 3.5.  Alcohol abuse - Alcohol level not obtained on this admission - Alcohol level obtained the following day after the admission and it was within normal limits. No reports of it rales.  Hypokalemia - Secondary to GI losses - Potassium supplemented throughout the hospital stay. She will continue potassium supplementation for 5 more days on discharge.  Seizures - Continue Keppra. - No reports of seizures.   DVT Prophylaxis  - SCD's bilaterally in hospital    Code Status: Full.  Family Communication: plan of care discussed with the patient   IV access:  Peripheral IV  Procedures and diagnostic studies:   No results found.  Medical Consultants:  Gastroenterology  Other Consultants:  None   IAnti-Infectives:   Flagyl 12/13/2014 --> for 12 days on discharge      Signed:  Leisa Lenz, MD  [facility] 12/15/2014, 8:40 AM  Pager #: [phone]  Time spent in minutes: more than 30 minutes   Discharge Exam: Filed Vitals:   12/15/14 0601  BP: 113/73  Pulse: 94  Temp: 98.1 F (36.7 C)  Resp: 18   Filed Vitals:   12/14/14 0459 12/14/14 1512 12/14/14 2105 12/15/14 0601  BP: 113/76 122/80 133/95 113/73  Pulse: 79 84 87 94  Temp: 98 F (36.7 C) 98.5 F (36.9 C) 98.4 F (36.9 C) 98.1 F (36.7 C)  TempSrc:  Oral Oral Oral Oral  Resp: 16 18 18 18   Height:      Weight:      SpO2: 98% 98% 100% 97%    General: Pt is alert, follows commands appropriately, not in acute distress Cardiovascular: Regular rate and rhythm, S1/S2 +, no murmurs Respiratory: Clear to auscultation bilaterally, no wheezing, no crackles, no rhonchi Abdominal: Soft, non tender, non distended, bowel sounds +, no  guarding Extremities: no edema, no cyanosis, pulses palpable bilaterally DP and PT Neuro: Grossly nonfocal  Discharge Instructions  Discharge Instructions    Call MD for:  difficulty breathing, headache or visual disturbances    Complete by:  As directed      Call MD for:  persistant nausea and vomiting    Complete by:  As directed      Call MD for:  severe uncontrolled pain    Complete by:  As directed      Diet - low sodium heart healthy    Complete by:  As directed      Discharge instructions    Complete by:  As directed   1. Take the Flagyl for 12 more days on discharge for treatment of C. difficile colitis. 2. Take potassium for next 5 days on discharge as prescribed.     Increase activity slowly    Complete by:  As directed             Medication List    STOP taking these medications        carvedilol 3.125 MG tablet  Commonly known as:  COREG     ciprofloxacin 500 MG tablet  Commonly known as:  CIPRO     folic acid 1 MG tablet  Commonly known as:  FOLVITE      TAKE these medications        calcium-vitamin D 500-200 MG-UNIT per tablet  Commonly known as:  OSCAL WITH D  Take 1 tablet by mouth daily with breakfast.     dicyclomine 20 MG tablet  Commonly known as:  BENTYL  Take 1 tablet (20 mg total) by mouth 2 (two) times daily.     hydrocortisone-pramoxine rectal foam  Commonly known as:  PROCTOFOAM HC  Place 1 applicator rectally 2 (two) times daily.     ibuprofen 200 MG tablet  Commonly known as:  ADVIL,MOTRIN  Take 400 mg by mouth every 6 (six) hours as needed for moderate pain (pain).     levETIRAcetam 1000 MG tablet  Commonly known as:  KEPPRA  Take 1 tablet (1,000 mg total) by mouth daily. For seizure activities     metroNIDAZOLE 500 MG tablet  Commonly known as:  FLAGYL  Take 1 tablet (500 mg total) by mouth 3 (three) times daily.     mirtazapine 30 MG tablet  Commonly known as:  REMERON  Take 1 tablet (30 mg total) by mouth at bedtime.  For depression/sleep     ondansetron 4 MG disintegrating tablet  Commonly known as:  ZOFRAN ODT  Take 1 tablet (4 mg total) by mouth every 8 (eight) hours as needed for nausea or vomiting.     Potassium Chloride ER 20 MEQ Tbcr  Take 20 mEq by mouth daily.     saccharomyces boulardii 250 MG capsule  Commonly known as:  FLORASTOR  Take 1 capsule (250 mg total) by mouth 2 (two) times daily.     VISINE OP  Apply 1-2 drops to eye daily as needed (dry eyes).  Follow-up Information    Follow up with Angelica Chessman, MD. Schedule an appointment as soon as possible for a visit in 1 week.   Specialty:  Internal Medicine   Why:  Follow up appt after recent hospitalization   Contact information:   [place] [id number] [phone]        The results of significant diagnostics from this hospitalization (including imaging, microbiology, ancillary and laboratory) are listed below for reference.    Significant Diagnostic Studies: Ct Abdomen Pelvis W Contrast  12/08/2014   CLINICAL DATA:  GI bleeding with diffuse abdominal pain.  EXAM: CT ABDOMEN AND PELVIS WITH CONTRAST  TECHNIQUE: Multidetector CT imaging of the abdomen and pelvis was performed using the standard protocol following bolus administration of intravenous contrast.  CONTRAST:  6mL OMNIPAQUE IOHEXOL 300 MG/ML SOLN, 148mL OMNIPAQUE IOHEXOL 300 MG/ML SOLN  COMPARISON:  Multiple prior CT, most recently 05/28/2014  FINDINGS: The included lung bases are clear.  Diffusely decreased hepatic density, no evident focal hepatic lesion. The gallbladder is physiologically distended. The spleen, pancreas, and adrenal glands are normal. Symmetric renal enhancement without hydronephrosis or perinephric stranding.  The stomach is decompressed without gastric wall thickening. There are no dilated or thickened small bowel loops. There is mild diffuse colonic wall thickening, with near pan colonic involvement. Mild  perienteric inflammatory change noted about the distal colon. There is no perforation, abscess, or intra-abdominal fluid collection. No intra-abdominal ascites. The appendix is not seen.  No retroperitoneal adenopathy. Abdominal aorta is normal in caliber. Serpiginous left retroperitoneum vascularity, unchanged.  Within the pelvis the uterus is normal for age. Bladder is physiologically distended. There is a 2.8 cm left ovarian cyst, likely physiologic. Right ovary is not definitively identified. Small amount of free fluid in the pelvis.  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 There are no acute or suspicious osseous abnormalities.  IMPRESSION: 1. Mild diffuse colonic wall thickening consistent with near pan colitis. This is likely infectious or inflammatory. 2. Severely decreased hepatic density, progressed from prior exam. Findings may reflect hepatic steatosis or other chronic liver disease.   Electronically Signed   By: Jeb Levering M.D.   On: 12/08/2014 23:51    Microbiology: Recent Results (from the past 240 hour(s))  Clostridium Difficile by PCR     Status: Abnormal   Collection Time: 12/13/14  9:36 AM  Result Value Ref Range Status   C difficile by pcr POSITIVE (A) NEGATIVE Final    Comment: CRITICAL RESULT CALLED TO, READ BACK BY AND VERIFIED WITH: RYAN,K @ 1120 ON [id number] BY POTEAT,S      Labs: Basic Metabolic Panel:  Recent Labs Lab 12/08/14 1917 12/12/14 1536 12/13/14 0545 12/14/14 0912  NA 141 136 138 138  K 2.8* 3.3* 2.8* 2.9*  CL 102 98* 103 103  CO2 27 22 25 25   GLUCOSE 108* 123* 112* 88  BUN 6 <5* <5* <5*  CREATININE <0.30* 0.38* 0.36* 0.39*  CALCIUM 8.1* 7.9* 7.1* 6.4*   Liver Function Tests:  Recent Labs Lab 12/08/14 1917 12/12/14 1536 12/13/14 0545  AST 75* 79* 70*  ALT 19 18 13*  ALKPHOS 141* 132* 115  BILITOT 0.5 2.3* 2.6*  PROT 7.5 7.1 6.2*  ALBUMIN 4.2 4.1 3.6    Recent Labs Lab  12/08/14 1917  LIPASE 30   No results for input(s): AMMONIA in the last 168 hours. CBC:  Recent Labs Lab 12/08/14 2020 12/12/14 1536 12/13/14 0545  12/14/14 0912 12/15/14 0824  WBC 3.5* 3.6* 3.4* 3.1* 3.5*  NEUTROABS 1.8 2.6  --   --   --   HGB 11.0* 10.6* 9.7* 8.4* 8.9*  HCT 33.3* 31.4* 30.1* 26.4* 27.3*  MCV 92.5 90.8 94.4 95.3 94.5  PLT 130* 71* 47* 50* 65*   Cardiac Enzymes: No results for input(s): CKTOTAL, CKMB, CKMBINDEX, TROPONINI in the last 168 hours. BNP: BNP (last 3 results)  Recent Labs  08/15/14 1903  BNP 43.8    ProBNP (last 3 results)  Recent Labs  03/25/14 1650 04/03/14 2155  PROBNP 127.2* 12.8    CBG: No results for input(s): GLUCAP in the last 168 hours.

## 2014-12-15 NOTE — Progress Notes (Signed)
Patient discharged home, all discharge medications and instructions reviewed and questions answered.  Patient to call for wheelchair assistance to vehicle when ride arrives.

## 2014-12-17 ENCOUNTER — Emergency Department (HOSPITAL_COMMUNITY)
Admission: EM | Admit: 2014-12-17 | Discharge: 2014-12-17 | Discharge status: Against Medical Advice | Payer: Self-pay | Attending: Emergency Medicine | Admitting: Emergency Medicine

## 2014-12-17 ENCOUNTER — Encounter (HOSPITAL_COMMUNITY): Payer: Self-pay | Admitting: Emergency Medicine

## 2014-12-17 DIAGNOSIS — R109 Unspecified abdominal pain: Secondary | ICD-10-CM

## 2014-12-17 DIAGNOSIS — Z8719 Personal history of other diseases of the digestive system: Secondary | ICD-10-CM | POA: Insufficient documentation

## 2014-12-17 DIAGNOSIS — R1032 Left lower quadrant pain: Secondary | ICD-10-CM | POA: Insufficient documentation

## 2014-12-17 DIAGNOSIS — G40909 Epilepsy, unspecified, not intractable, without status epilepticus: Secondary | ICD-10-CM | POA: Insufficient documentation

## 2014-12-17 DIAGNOSIS — Z8742 Personal history of other diseases of the female genital tract: Secondary | ICD-10-CM | POA: Insufficient documentation

## 2014-12-17 DIAGNOSIS — G8929 Other chronic pain: Secondary | ICD-10-CM | POA: Insufficient documentation

## 2014-12-17 DIAGNOSIS — I252 Old myocardial infarction: Secondary | ICD-10-CM | POA: Insufficient documentation

## 2014-12-17 DIAGNOSIS — Z862 Personal history of diseases of the blood and blood-forming organs and certain disorders involving the immune mechanism: Secondary | ICD-10-CM | POA: Insufficient documentation

## 2014-12-17 DIAGNOSIS — F419 Anxiety disorder, unspecified: Secondary | ICD-10-CM | POA: Insufficient documentation

## 2014-12-17 DIAGNOSIS — Z880 Allergy status to penicillin: Secondary | ICD-10-CM | POA: Insufficient documentation

## 2014-12-17 LAB — CBC WITH DIFFERENTIAL/PLATELET
BASOS PCT: 1 % (ref 0–1)
Basophils Absolute: 0 10*3/uL (ref 0.0–0.1)
EOS ABS: 0 10*3/uL (ref 0.0–0.7)
EOS PCT: 1 % (ref 0–5)
HEMATOCRIT: 27.9 % — AB (ref 36.0–46.0)
Hemoglobin: 9.3 g/dL — ABNORMAL LOW (ref 12.0–15.0)
LYMPHS ABS: 0.9 10*3/uL (ref 0.7–4.0)
LYMPHS PCT: 29 % (ref 12–46)
MCH: 30.9 pg (ref 26.0–34.0)
MCHC: 33.3 g/dL (ref 30.0–36.0)
MCV: 92.7 fL (ref 78.0–100.0)
Monocytes Absolute: 0.5 10*3/uL (ref 0.1–1.0)
Monocytes Relative: 17 % — ABNORMAL HIGH (ref 3–12)
NEUTROS ABS: 1.6 10*3/uL — AB (ref 1.7–7.7)
Neutrophils Relative %: 52 % (ref 43–77)
PLATELETS: 156 10*3/uL (ref 150–400)
RBC: 3.01 MIL/uL — ABNORMAL LOW (ref 3.87–5.11)
RDW: 19.3 % — AB (ref 11.5–15.5)
WBC: 3 10*3/uL — AB (ref 4.0–10.5)

## 2014-12-17 LAB — COMPREHENSIVE METABOLIC PANEL
ALK PHOS: 130 U/L — AB (ref 38–126)
ALT: 19 U/L (ref 14–54)
AST: 98 U/L — AB (ref 15–41)
Albumin: 3.6 g/dL (ref 3.5–5.0)
Anion gap: 11 (ref 5–15)
BUN: <5 mg/dL — ABNORMAL LOW (ref 6–20)
CHLORIDE: 104 mmol/L (ref 101–111)
CO2: 26 mmol/L (ref 22–32)
Calcium: 8.7 mg/dL — ABNORMAL LOW (ref 8.9–10.3)
Creatinine, Ser: 0.49 mg/dL (ref 0.44–1.00)
GFR calc Af Amer: >60 mL/min (ref >60)
GFR calc non Af Amer: >60 mL/min (ref >60)
Glucose, Bld: 102 mg/dL — ABNORMAL HIGH (ref 65–99)
POTASSIUM: 2.5 mmol/L — AB (ref 3.5–5.1)
Sodium: 141 mmol/L (ref 135–145)
Total Bilirubin: 0.5 mg/dL (ref 0.3–1.2)
Total Protein: 6.2 g/dL — ABNORMAL LOW (ref 6.5–8.1)

## 2014-12-17 LAB — CBG MONITORING, ED: Glucose-Capillary: 95 mg/dL (ref 65–99)

## 2014-12-17 LAB — ETHANOL: ALCOHOL ETHYL (B): 346 mg/dL — AB (ref <5)

## 2014-12-17 MED ORDER — SODIUM CHLORIDE 0.9 % IV SOLN: INTRAVENOUS | Status: DC

## 2014-12-17 MED ORDER — SODIUM CHLORIDE 0.9 % IV BOLUS (SEPSIS)
1000.0000 mL | Freq: Once | INTRAVENOUS | Status: AC
  Administered 2014-12-17: 1000 mL via INTRAVENOUS

## 2014-12-17 NOTE — ED Notes (Signed)
cbg of 95

## 2014-12-17 NOTE — ED Notes (Signed)
Reported to Dr. Zenia Resides that patient is ready to leave AMA

## 2014-12-17 NOTE — ED Notes (Signed)
4mg  of zofran also given by ems.

## 2014-12-17 NOTE — ED Notes (Signed)
Dr. Allen at the bedside.  

## 2014-12-17 NOTE — ED Notes (Signed)
Per EMS, the patient had a seizure, 4 in a row within 5 minutes. 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 2.5 mg of versed given by ems. Complains of stomach pain, nausea anf vomiting. Recently seen at [facility] for colitis. VS with EMS: BP 114/80, p 94. rr 18, o2 sat 98% on 10L via nonrebreather.

## 2014-12-17 NOTE — ED Provider Notes (Addendum)
CSN: [id number]     Arrival date & time 12/17/14  2023 History   First MD Initiated Contact with Patient 12/17/14 2028     Chief Complaint  Patient presents with  . Seizures     (Consider location/radiation/quality/duration/timing/severity/associated sxs/prior Treatment) HPI Comments: Patient here after having seizure-like activity prior to arrival. According to EMS, the patient had 4 episodes of generalized shaking. Without postictal period. One EMS provider noted that a more Junior provider gave the patient Versed. The more senior EMS provider states that he did not believe this was seizure activity. 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 Was recently admitted for C. difficile colitis and continues to note chronic lower abdominal pain. Did have some blood in her stools but she does have chronic hemorrhoids and she thinks comes from this. States she has had some emesis has been nonbilious. Did have one alcoholic drink today. States she has been compliant with her medications for C. difficile  Patient is a 39 y.o. female presenting with seizures. The history is provided by the patient and the EMS personnel.  Seizures   Past Medical History  Diagnosis Date  . Proctitis   . Cysts of both ovaries   . Anemia   . Anxiety   . Blood transfusion without reported diagnosis   . Depression   . Fatty liver 10/05/13  . Cardiac arrest   . Seizures    Past Surgical History  Procedure Laterality Date  . Ovarian cyst removal    . Laparoscopy N/A 09/28/2013    Procedure: LAPAROSCOPY OPERATIVE;  Surgeon: Terrance Mass, MD;  Location: [facility];  Service: Gynecology;  Laterality: N/A;  . Laparoscopic appendectomy Right 09/28/2013    Procedure: APPENDECTOMY LAPAROSCOPIC;  Surgeon: Terrance Mass, MD;  Location: [facility];  Service: Gynecology;  Laterality: Right;  . Salpingoophorectomy Right 09/28/2013    Procedure: SALPINGO OOPHORECTOMY;  Surgeon: Terrance Mass,  MD;  Location: [facility];  Service: Gynecology;  Laterality: Right;  . Colonoscopy N/A 09/30/2013    Procedure: COLONOSCOPY;  Surgeon: Lafayette Dragon, MD;  Location: [facility];  Service: Endoscopy;  Laterality: N/A;  . Esophagogastroduodenoscopy N/A 11/23/2013    Procedure: ESOPHAGOGASTRODUODENOSCOPY (EGD);  Surgeon: Jerene Bears, MD;  Location: [facility];  Service: Endoscopy;  Laterality: N/A;  . Appendectomy    . Left and right heart catheterization with coronary angiogram N/A 02/23/2014    Procedure: LEFT AND RIGHT HEART CATHETERIZATION WITH CORONARY ANGIOGRAM;  Surgeon: Leonie Man, MD;  Location: [facility];  Service: Cardiovascular;  Laterality: N/A;   Family History  Problem Relation Age of Onset  . Diabetes Mother   . Hyperlipidemia Mother   . Stroke Mother   . Diabetes Father    History  Substance Use Topics  . Smoking status: Never Smoker   . Smokeless tobacco: Never Used  . Alcohol Use: Yes     Comment: occaisonal, last drink today   OB History    Gravida Para Term Preterm AB TAB SAB Ectopic Multiple Living   7 3   4  4   3      Review of Systems  Neurological: Positive for seizures.  All other systems reviewed and are negative.     Allergies  Morphine and related; Tramadol; and Penicillins  Home Medications   Prior to Admission medications   Medication Sig Start Date End Date Taking? Authorizing Provider  calcium-vitamin D (OSCAL WITH D) 500-200 MG-UNIT per tablet Take  1 tablet by mouth daily with breakfast. 12/15/14   Robbie Lis, MD  dicyclomine (BENTYL) 20 MG tablet Take 1 tablet (20 mg total) by mouth 2 (two) times daily. 12/09/14   Waynetta Pean, PA-C  hydrocortisone-pramoxine (PROCTOFOAM [facility]) rectal foam Place 1 applicator rectally 2 (two) times daily. 12/09/14   Waynetta Pean, PA-C  ibuprofen (ADVIL,MOTRIN) 200 MG tablet Take 400 mg by mouth every 6 (six) hours as needed for moderate pain (pain).    Historical Provider, MD  levETIRAcetam (KEPPRA) 1000  MG tablet Take 1 tablet (1,000 mg total) by mouth daily. For seizure activities Patient taking differently: Take 1,000 mg by mouth 2 (two) times daily. For seizure activities 07/04/14   Encarnacion Slates, NP  metroNIDAZOLE (FLAGYL) 500 MG tablet Take 1 tablet (500 mg total) by mouth 3 (three) times daily. 12/15/14   Robbie Lis, MD  mirtazapine (REMERON) 30 MG tablet Take 1 tablet (30 mg total) by mouth at bedtime. For depression/sleep Patient taking differently: Take 30 mg by mouth at bedtime as needed (sleep). For depression/sleep 07/04/14   Encarnacion Slates, NP  ondansetron (ZOFRAN ODT) 4 MG disintegrating tablet Take 1 tablet (4 mg total) by mouth every 8 (eight) hours as needed for nausea or vomiting. 12/09/14   Waynetta Pean, PA-C  potassium chloride 20 MEQ TBCR Take 20 mEq by mouth daily. 12/15/14   Robbie Lis, MD  saccharomyces boulardii (FLORASTOR) 250 MG capsule Take 1 capsule (250 mg total) by mouth 2 (two) times daily. 12/15/14   Robbie Lis, MD  Tetrahydrozoline HCl (VISINE OP) Apply 1-2 drops to eye daily as needed (dry eyes).    Historical Provider, MD   BP 115/66 mmHg  Pulse 90  Temp(Src) 97.9 F (36.6 C) (Oral)  Resp 17  Ht 5\' 6"  (1.676 m)  Wt 130 lb (58.968 kg)  BMI 20.99 kg/m2  SpO2 96%  LMP 11/18/2014 Physical Exam  Constitutional: She is oriented to person, place, and time. She appears well-developed and well-nourished.  Non-toxic appearance. No distress.  HENT:  Head: Normocephalic and atraumatic.  Eyes: Conjunctivae, EOM and lids are normal. Pupils are equal, round, and reactive to light.  Neck: Normal range of motion. Neck supple. No tracheal deviation present. No thyroid mass present.  Cardiovascular: Normal rate, regular rhythm and normal heart sounds.  Exam reveals no gallop.   No murmur heard. Pulmonary/Chest: Effort normal and breath sounds normal. No stridor. No respiratory distress. She has no decreased breath sounds. She has no wheezes. She has no rhonchi. She  has no rales.  Abdominal: Soft. Normal appearance and bowel sounds are normal. She exhibits no distension. There is tenderness in the left lower quadrant. There is no rigidity, no rebound, no guarding and no CVA tenderness.    Musculoskeletal: Normal range of motion. She exhibits no edema or tenderness.  Neurological: She is alert and oriented to person, place, and time. She has normal strength. No cranial nerve deficit or sensory deficit. GCS eye subscore is 4. GCS verbal subscore is 5. GCS motor subscore is 6.  Skin: Skin is warm and dry. No abrasion and no rash noted.  Psychiatric: She has a normal mood and affect. Her speech is normal and behavior is normal.  Nursing note and vitals reviewed.   ED Course  Procedures (including critical care time) Labs Review Labs Reviewed  ETHANOL  URINE RAPID DRUG SCREEN (HOSP PERFORMED) NOT AT [facility]  CBC WITH DIFFERENTIAL/PLATELET  COMPREHENSIVE METABOLIC PANEL  CBG MONITORING,  ED    Imaging Review No results found.   EKG Interpretation None      MDM   Final diagnoses:  None    Patient refused to wait for lab and left before I could speak with here. Labs resulted after patient eloped.Attempted to call patient but call went to voice mail and mail box was full. Will attempt to contact patient again in morning and have her return for treatment of her hypokalemia    Lacretia Leigh, MD 12/18/14 9450  Lacretia Leigh, MD 12/18/14 [phone]

## 2014-12-18 NOTE — ED Provider Notes (Signed)
Charge RN [place] at [facility] called attempted to contact pt today but again call went to voicemail and the mailbox was full. He will continue to try to contact her  Lacretia Leigh, MD 12/18/14 [phone]

## 2015-01-07 ENCOUNTER — Emergency Department (HOSPITAL_COMMUNITY)
Admission: EM | Admit: 2015-01-07 | Discharge: 2015-01-07 | Disposition: A | Discharge status: Home / Self Care | Payer: Self-pay | Attending: Emergency Medicine | Admitting: Emergency Medicine

## 2015-01-07 ENCOUNTER — Telehealth (HOSPITAL_COMMUNITY): Payer: Self-pay

## 2015-01-07 DIAGNOSIS — Z8674 Personal history of sudden cardiac arrest: Secondary | ICD-10-CM | POA: Insufficient documentation

## 2015-01-07 DIAGNOSIS — R569 Unspecified convulsions: Secondary | ICD-10-CM | POA: Insufficient documentation

## 2015-01-07 DIAGNOSIS — Z8742 Personal history of other diseases of the female genital tract: Secondary | ICD-10-CM | POA: Insufficient documentation

## 2015-01-07 DIAGNOSIS — F419 Anxiety disorder, unspecified: Secondary | ICD-10-CM | POA: Insufficient documentation

## 2015-01-07 DIAGNOSIS — Z862 Personal history of diseases of the blood and blood-forming organs and certain disorders involving the immune mechanism: Secondary | ICD-10-CM | POA: Insufficient documentation

## 2015-01-07 DIAGNOSIS — F329 Major depressive disorder, single episode, unspecified: Secondary | ICD-10-CM | POA: Insufficient documentation

## 2015-01-07 DIAGNOSIS — Z8719 Personal history of other diseases of the digestive system: Secondary | ICD-10-CM | POA: Insufficient documentation

## 2015-01-07 DIAGNOSIS — Z880 Allergy status to penicillin: Secondary | ICD-10-CM | POA: Insufficient documentation

## 2015-01-07 LAB — CBC WITH DIFFERENTIAL/PLATELET
BASOS PCT: 0 % (ref 0–1)
Basophils Absolute: 0 10*3/uL (ref 0.0–0.1)
Eosinophils Absolute: 0.2 10*3/uL (ref 0.0–0.7)
Eosinophils Relative: 1 % (ref 0–5)
HCT: 55.4 % — ABNORMAL HIGH (ref 36.0–46.0)
HEMOGLOBIN: 18.3 g/dL — AB (ref 12.0–15.0)
Lymphocytes Relative: 22 % (ref 12–46)
Lymphs Abs: 3 10*3/uL (ref 0.7–4.0)
MCH: 32.7 pg (ref 26.0–34.0)
MCHC: 33 g/dL (ref 30.0–36.0)
MCV: 99.1 fL (ref 78.0–100.0)
Monocytes Absolute: 0.5 10*3/uL (ref 0.1–1.0)
Monocytes Relative: 4 % (ref 3–12)
NEUTROS PCT: 73 % (ref 43–77)
Neutro Abs: 9.9 10*3/uL — ABNORMAL HIGH (ref 1.7–7.7)
PLATELETS: 142 10*3/uL — AB (ref 150–400)
RBC: 5.59 MIL/uL — AB (ref 3.87–5.11)
RDW: 15.7 % — ABNORMAL HIGH (ref 11.5–15.5)
WBC: 13.5 10*3/uL — AB (ref 4.0–10.5)

## 2015-01-07 LAB — ETHANOL: Alcohol, Ethyl (B): 377 mg/dL (ref <5)

## 2015-01-07 LAB — BASIC METABOLIC PANEL
Anion gap: 8 (ref 5–15)
CO2: 23 mmol/L (ref 22–32)
CREATININE: 0.34 mg/dL — AB (ref 0.44–1.00)
Calcium: 7.2 mg/dL — ABNORMAL LOW (ref 8.9–10.3)
Chloride: 107 mmol/L (ref 101–111)
GFR calc Af Amer: >60 mL/min (ref >60)
GLUCOSE: 104 mg/dL — AB (ref 65–99)
POTASSIUM: 2.8 mmol/L — AB (ref 3.5–5.1)
Sodium: 138 mmol/L (ref 135–145)

## 2015-01-07 LAB — RAPID URINE DRUG SCREEN, HOSP PERFORMED
AMPHETAMINES: NOT DETECTED
BARBITURATES: NOT DETECTED
Benzodiazepines: POSITIVE — AB
Cocaine: NOT DETECTED
Opiates: NOT DETECTED
TETRAHYDROCANNABINOL: NOT DETECTED

## 2015-01-07 LAB — TROPONIN I

## 2015-01-07 MED ORDER — SODIUM CHLORIDE 0.9 % IV BOLUS (SEPSIS)
1000.0000 mL | Freq: Once | INTRAVENOUS | Status: AC
  Administered 2015-01-07: 1000 mL via INTRAVENOUS

## 2015-01-07 MED ORDER — KETOROLAC TROMETHAMINE 30 MG/ML IJ SOLN
30.0000 mg | Freq: Once | INTRAMUSCULAR | Status: AC
  Administered 2015-01-07: 30 mg via INTRAVENOUS
  Filled 2015-01-07: qty 1

## 2015-01-07 NOTE — Discharge Instructions (Signed)
Nonepileptic Seizures °Nonepileptic seizures are seizures that are not caused by abnormal electrical signals in your brain. These seizures often seem like epileptic seizures, but they are not caused by epilepsy.  °There are two types of nonepileptic seizures: °· A physiologic nonepileptic seizure results from a disruption in your brain. °· A psychogenic seizure results from emotional stress. These seizures are sometimes called pseudoseizures. °CAUSES  °Causes of physiologic nonepileptic seizures include:  °· Sudden drop in blood pressure. °· Low blood sugar. °· Low levels of salt (sodium) in your blood. °· Low levels of calcium in your blood. °· Migraine. °· Heart rhythm problems. °· Sleep disorders. °· Drug and alcohol abuse. °Common causes of psychogenic nonepileptic seizures include: °· Stress. °· Emotional trauma. °· Sexual or physical abuse. °· Major life events, such as divorce or the death of a loved one. °· Mental health disorders, including panic attack and hyperactivity disorder. °SIGNS AND SYMPTOMS °A nonepileptic seizure can look like an epileptic seizure, including uncontrollable shaking (convulsions), or changes in attention, behavior, or the ability to remain awake and alert. However, there are some differences. Nonepileptic seizures usually: °· Do not cause physical injuries. °· Start slowly. °· Include crying or shrieking. °· Last longer than 2 minutes. °· Have a short recovery time without headache or exhaustion. °DIAGNOSIS  °Your health care provider can usually diagnose nonepileptic seizures after taking your medical history and giving you a physical exam. Your health care provider may want to talk to your friends or relatives who have seen you have a seizure.  °You may also need to have tests to look for causes of physiologic nonepileptic seizures. This may include an electroencephalogram (EEG), which is a test that measures electrical activity in your brain. If you have had an epileptic  seizure, the results of your EEG will be abnormal. If your health care provider thinks you have had a psychogenic nonepileptic seizure, you may need to see a mental health specialist for an evaluation. °TREATMENT  °Treatment depends on the type and cause of your seizures. °· For physiologic nonepileptic seizures, treatment is aimed at addressing the underlying condition that caused the seizures. These seizures usually stop when the underlying condition is properly treated. °· Nonepileptic seizures do not respond to the seizure medicines used to treat epilepsy. °· For psychogenic seizures, you may need to work with a mental health specialist. °HOME CARE INSTRUCTIONS °Home care will depend on the type of nonepileptic seizures you have.  °· Follow all your health care provider's instructions. °· Keep all your follow-up appointments. °SEEK MEDICAL CARE IF: °You continue to have seizures after treatment. °SEEK IMMEDIATE MEDICAL CARE IF: °· Your seizures change or become more frequent. °· You injure yourself during a seizure. °· You have one seizure after another. °· You have trouble recovering from a seizure. °· You have chest pain or trouble breathing. °MAKE SURE YOU: °· Understand these instructions. °· Will watch your condition. °· Will get help right away if you are not doing well or get worse. °Document Released: 08/21/2005 Document Revised: 11/20/2013 Document Reviewed: 05/02/2013 °[employer]® Patient Information ©2015 [employer]. This information is not intended to replace advice given to you by your health care provider. Make sure you discuss any questions you have with your health care provider. ° °

## 2015-01-07 NOTE — ED Provider Notes (Signed)
CSN: [id number]     Arrival date & time 01/07/15  0212 History   First MD Initiated Contact with Patient 01/07/15 0234     Chief Complaint  Patient presents with  . Seizures  . Abdominal Pain     (Consider location/radiation/quality/duration/timing/severity/associated sxs/prior Treatment) Patient is a 39 y.o. female presenting with seizures.  Seizures Seizure activity on arrival: no   Seizure type:  Grand mal Preceding symptoms: aura   Initial focality:  None Episode characteristics: abnormal movements   Postictal symptoms: confusion and somnolence   Return to baseline: yes   Severity:  Moderate Timing:  Once Progression:  Resolved Context comment:  Using etoh at time of seizure Recent head injury:  No recent head injuries PTA treatment:  None History of seizures: yes   Similar to previous episodes: yes     Past Medical History  Diagnosis Date  . Proctitis   . Cysts of both ovaries   . Anemia   . Anxiety   . Blood transfusion without reported diagnosis   . Depression   . Fatty liver 10/05/13  . Cardiac arrest   . Seizures    Past Surgical History  Procedure Laterality Date  . Ovarian cyst removal    . Laparoscopy N/A 09/28/2013    Procedure: LAPAROSCOPY OPERATIVE;  Surgeon: Terrance Mass, MD;  Location: [facility];  Service: Gynecology;  Laterality: N/A;  . Laparoscopic appendectomy Right 09/28/2013    Procedure: APPENDECTOMY LAPAROSCOPIC;  Surgeon: Terrance Mass, MD;  Location: [facility];  Service: Gynecology;  Laterality: Right;  . Salpingoophorectomy Right 09/28/2013    Procedure: SALPINGO OOPHORECTOMY;  Surgeon: Terrance Mass, MD;  Location: [facility];  Service: Gynecology;  Laterality: Right;  . Colonoscopy N/A 09/30/2013    Procedure: COLONOSCOPY;  Surgeon: Lafayette Dragon, MD;  Location: [facility];  Service: Endoscopy;  Laterality: N/A;  . Esophagogastroduodenoscopy N/A 11/23/2013    Procedure: ESOPHAGOGASTRODUODENOSCOPY (EGD);  Surgeon: Jerene Bears, MD;   Location: [facility];  Service: Endoscopy;  Laterality: N/A;  . Appendectomy    . Left and right heart catheterization with coronary angiogram N/A 02/23/2014    Procedure: LEFT AND RIGHT HEART CATHETERIZATION WITH CORONARY ANGIOGRAM;  Surgeon: Leonie Man, MD;  Location: [facility];  Service: Cardiovascular;  Laterality: N/A;   Family History  Problem Relation Age of Onset  . Diabetes Mother   . Hyperlipidemia Mother   . Stroke Mother   . Diabetes Father    History  Substance Use Topics  . Smoking status: Never Smoker   . Smokeless tobacco: Never Used  . Alcohol Use: Yes     Comment: occaisonal, last drink today   OB History    Gravida Para Term Preterm AB TAB SAB Ectopic Multiple Living   7 3   4  4   3      Review of Systems  Neurological: Positive for seizures.  All other systems reviewed and are negative.     Allergies  Morphine and related; Tramadol; and Penicillins  Home Medications   Prior to Admission medications   Medication Sig Start Date End Date Taking? Authorizing Provider  ibuprofen (ADVIL,MOTRIN) 200 MG tablet Take 400 mg by mouth every 6 (six) hours as needed for moderate pain (pain).   Yes Historical Provider, MD  levETIRAcetam (KEPPRA) 1000 MG tablet Take 1 tablet (1,000 mg total) by mouth daily. For seizure activities Patient taking differently: Take 1,000 mg by mouth 2 (two) times daily. For seizure activities 07/04/14  Yes Encarnacion Slates, NP  calcium-vitamin D (OSCAL WITH D) 500-200 MG-UNIT per tablet Take 1 tablet by mouth daily with breakfast. Patient not taking: Reported on 01/07/2015 12/15/14   Robbie Lis, MD  dicyclomine (BENTYL) 20 MG tablet Take 1 tablet (20 mg total) by mouth 2 (two) times daily. Patient not taking: Reported on 01/07/2015 12/09/14   Waynetta Pean, PA-C  hydrocortisone-pramoxine (PROCTOFOAM [facility]) rectal foam Place 1 applicator rectally 2 (two) times daily. Patient not taking: Reported on 01/07/2015 12/09/14   Waynetta Pean,  PA-C  metroNIDAZOLE (FLAGYL) 500 MG tablet Take 1 tablet (500 mg total) by mouth 3 (three) times daily. Patient not taking: Reported on 01/07/2015 12/15/14   Robbie Lis, MD  mirtazapine (REMERON) 30 MG tablet Take 1 tablet (30 mg total) by mouth at bedtime. For depression/sleep Patient not taking: Reported on 01/07/2015 07/04/14   Encarnacion Slates, NP  ondansetron (ZOFRAN ODT) 4 MG disintegrating tablet Take 1 tablet (4 mg total) by mouth every 8 (eight) hours as needed for nausea or vomiting. Patient not taking: Reported on 01/07/2015 12/09/14   Waynetta Pean, PA-C  potassium chloride 20 MEQ TBCR Take 20 mEq by mouth daily. Patient not taking: Reported on 01/07/2015 12/15/14   Robbie Lis, MD  saccharomyces boulardii (FLORASTOR) 250 MG capsule Take 1 capsule (250 mg total) by mouth 2 (two) times daily. Patient not taking: Reported on 01/07/2015 12/15/14   Robbie Lis, MD   BP 95/56 mmHg  Pulse 72  Resp 16  SpO2 98%  LMP 11/18/2014 Physical Exam  Constitutional: She is oriented to person, place, and time. She appears well-developed and well-nourished.  HENT:  Head: Normocephalic and atraumatic.  Right Ear: External ear normal.  Left Ear: External ear normal.  Eyes: Conjunctivae and EOM are normal. Pupils are equal, round, and reactive to light.  Neck: Normal range of motion. Neck supple.  Cardiovascular: Normal rate, regular rhythm, normal heart sounds and intact distal pulses.   Pulmonary/Chest: Effort normal and breath sounds normal.  Abdominal: Soft. Bowel sounds are normal. There is generalized tenderness.  Musculoskeletal: Normal range of motion.  Neurological: She is alert and oriented to person, place, and time. She has normal strength and normal reflexes. No cranial nerve deficit or sensory deficit. Gait normal. GCS eye subscore is 4. GCS verbal subscore is 5. GCS motor subscore is 6.  Skin: Skin is warm and dry.  Vitals reviewed.   ED Course  Procedures (including critical  care time) Labs Review Labs Reviewed  CBC WITH DIFFERENTIAL/PLATELET - Abnormal; Notable for the following:    WBC 13.5 (*)    RBC 5.59 (*)    Hemoglobin 18.3 (*)    HCT 55.4 (*)    RDW 15.7 (*)    Platelets 142 (*)    Neutro Abs 9.9 (*)    All other components within normal limits  COMPREHENSIVE METABOLIC PANEL - Abnormal; Notable for the following:    Chloride 100 (*)    Glucose, Bld 163 (*)    BUN 27 (*)    Creatinine, Ser 1.89 (*)    Calcium 8.5 (*)    Total Bilirubin 1.5 (*)    GFR calc non Af Amer 32 (*)    GFR calc Af Amer 38 (*)    All other components within normal limits  ETHANOL - Abnormal; Notable for the following:    Alcohol, Ethyl (B) 377 (*)    All other components within normal limits  URINE RAPID  DRUG SCREEN, HOSP PERFORMED - Abnormal; Notable for the following:    Benzodiazepines POSITIVE (*)    All other components within normal limits  BASIC METABOLIC PANEL - Abnormal; Notable for the following:    Potassium 2.8 (*)    Glucose, Bld 104 (*)    BUN <5 (*)    Creatinine, Ser 0.34 (*)    Calcium 7.2 (*)    All other components within normal limits  TROPONIN I    Imaging Review No results found.   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  Pt returned to baseline on my exam.  Has chronic abd pain which is at baseline.  DC home in stable condition.    I have reviewed all laboratory and imaging studies if ordered as above  1. Seizure-like activity         Debby Freiberg, MD 01/10/15 0700

## 2015-01-07 NOTE — ED Notes (Signed)
Per EMS: Pt had a possible seizure. Hx of same and ETOH abuse. Pt takes keppra every day, states shes had today's dose. Pt fell due to seizure, denies any head trauma or incontinence. EMS gave 4mg  zofran. BP = 114/78, 98bpm, CBG=134.

## 2015-01-07 NOTE — Telephone Encounter (Signed)
Lab calling w/corrected CMP.  Will have reviewed by ED MD/PA.

## 2015-01-08 LAB — COMPREHENSIVE METABOLIC PANEL
ALT: 20 U/L (ref 14–54)
ANION GAP: 12 (ref 5–15)
AST: 36 U/L (ref 15–41)
Albumin: 3.5 g/dL (ref 3.5–5.0)
Alkaline Phosphatase: 122 U/L (ref 38–126)
BUN: 27 mg/dL — AB (ref 6–20)
CO2: 26 mmol/L (ref 22–32)
CREATININE: 1.89 mg/dL — AB (ref 0.44–1.00)
Calcium: 8.5 mg/dL — ABNORMAL LOW (ref 8.9–10.3)
Chloride: 100 mmol/L — ABNORMAL LOW (ref 101–111)
GFR calc Af Amer: 38 mL/min — ABNORMAL LOW (ref >60)
GFR, EST NON AFRICAN AMERICAN: 32 mL/min — AB (ref >60)
GLUCOSE: 163 mg/dL — AB (ref 65–99)
Potassium: 4 mmol/L (ref 3.5–5.1)
SODIUM: 138 mmol/L (ref 135–145)
TOTAL PROTEIN: 6.5 g/dL (ref 6.5–8.1)
Total Bilirubin: 1.5 mg/dL — ABNORMAL HIGH (ref 0.3–1.2)

## 2015-01-10 ENCOUNTER — Inpatient Hospital Stay: Payer: Self-pay | Admitting: Internal Medicine

## 2015-01-14 ENCOUNTER — Telehealth: Payer: Self-pay | Admitting: Clinical

## 2015-01-14 NOTE — Telephone Encounter (Signed)
Attempt to contact pt, unable to leave message as "mailbox full"

## 2015-01-28 ENCOUNTER — Emergency Department (HOSPITAL_COMMUNITY)
Admission: EM | Admit: 2015-01-28 | Discharge: 2015-01-28 | Disposition: A | Discharge status: Home / Self Care | Payer: Self-pay | Attending: Emergency Medicine | Admitting: Emergency Medicine

## 2015-01-28 ENCOUNTER — Encounter (HOSPITAL_COMMUNITY): Payer: Self-pay | Admitting: *Deleted

## 2015-01-28 ENCOUNTER — Emergency Department (HOSPITAL_COMMUNITY): Payer: Self-pay

## 2015-01-28 DIAGNOSIS — Y998 Other external cause status: Secondary | ICD-10-CM | POA: Insufficient documentation

## 2015-01-28 DIAGNOSIS — G40909 Epilepsy, unspecified, not intractable, without status epilepticus: Secondary | ICD-10-CM | POA: Insufficient documentation

## 2015-01-28 DIAGNOSIS — S8002XA Contusion of left knee, initial encounter: Secondary | ICD-10-CM | POA: Insufficient documentation

## 2015-01-28 DIAGNOSIS — Z880 Allergy status to penicillin: Secondary | ICD-10-CM | POA: Insufficient documentation

## 2015-01-28 DIAGNOSIS — Z862 Personal history of diseases of the blood and blood-forming organs and certain disorders involving the immune mechanism: Secondary | ICD-10-CM | POA: Insufficient documentation

## 2015-01-28 DIAGNOSIS — F329 Major depressive disorder, single episode, unspecified: Secondary | ICD-10-CM | POA: Insufficient documentation

## 2015-01-28 DIAGNOSIS — W01198A Fall on same level from slipping, tripping and stumbling with subsequent striking against other object, initial encounter: Secondary | ICD-10-CM | POA: Insufficient documentation

## 2015-01-28 DIAGNOSIS — F419 Anxiety disorder, unspecified: Secondary | ICD-10-CM | POA: Insufficient documentation

## 2015-01-28 DIAGNOSIS — Z8674 Personal history of sudden cardiac arrest: Secondary | ICD-10-CM | POA: Insufficient documentation

## 2015-01-28 DIAGNOSIS — Y9389 Activity, other specified: Secondary | ICD-10-CM | POA: Insufficient documentation

## 2015-01-28 DIAGNOSIS — Z8742 Personal history of other diseases of the female genital tract: Secondary | ICD-10-CM | POA: Insufficient documentation

## 2015-01-28 DIAGNOSIS — S8392XA Sprain of unspecified site of left knee, initial encounter: Secondary | ICD-10-CM | POA: Insufficient documentation

## 2015-01-28 DIAGNOSIS — Y9289 Other specified places as the place of occurrence of the external cause: Secondary | ICD-10-CM | POA: Insufficient documentation

## 2015-01-28 DIAGNOSIS — Z8719 Personal history of other diseases of the digestive system: Secondary | ICD-10-CM | POA: Insufficient documentation

## 2015-01-28 MED ORDER — HYDROCODONE-ACETAMINOPHEN 5-325 MG PO TABS: 1.0000 | ORAL_TABLET | Freq: Four times a day (QID) | ORAL | Status: DC | PRN

## 2015-01-28 MED ORDER — NAPROXEN 500 MG PO TABS: 500.0000 mg | ORAL_TABLET | Freq: Two times a day (BID) | ORAL | Status: DC

## 2015-01-28 MED ORDER — HYDROCODONE-ACETAMINOPHEN 5-325 MG PO TABS
1.0000 | ORAL_TABLET | Freq: Once | ORAL | Status: AC
  Administered 2015-01-28: 1 via ORAL
  Filled 2015-01-28 (×2): qty 1

## 2015-01-28 NOTE — ED Provider Notes (Signed)
CSN: [id number]     Arrival date & time 01/28/15  1104 History  This chart was scribed for Jeannett Senior, PA-C, working with Malvin Johns, MD by Steva Colder, ED Scribe. The patient was seen in room TR07C/TR07C at 12:05 PM.     Chief Complaint  Patient presents with  . Knee Pain      The history is provided by the patient. No language interpreter was used.    HPI Comments: Kristen Roberson is a 39 y.o. female who presents to the Emergency Department complaining of worsening left knee pain onset 1 week ago. Pt notes that she tripped over her dog and fell outside and hit her left knee on the concrete. Pt is not able to see her PCP for a couple weeks. Pt notes that her LLE pain is shooting. She states that she is having associated symptoms of bruising on LLE and left knee joint swelling. She states that she has tried ibuprofen and ice last night with no relief for her symptoms. She denies wound, gait problem, and any other symptoms.   Past Medical History  Diagnosis Date  . Proctitis   . Cysts of both ovaries   . Anemia   . Anxiety   . Blood transfusion without reported diagnosis   . Depression   . Fatty liver 10/05/13  . Cardiac arrest   . Seizures    Past Surgical History  Procedure Laterality Date  . Ovarian cyst removal    . Laparoscopy N/A 09/28/2013    Procedure: LAPAROSCOPY OPERATIVE;  Surgeon: Terrance Mass, MD;  Location: [facility];  Service: Gynecology;  Laterality: N/A;  . Laparoscopic appendectomy Right 09/28/2013    Procedure: APPENDECTOMY LAPAROSCOPIC;  Surgeon: Terrance Mass, MD;  Location: [facility];  Service: Gynecology;  Laterality: Right;  . Salpingoophorectomy Right 09/28/2013    Procedure: SALPINGO OOPHORECTOMY;  Surgeon: Terrance Mass, MD;  Location: [facility];  Service: Gynecology;  Laterality: Right;  . Colonoscopy N/A 09/30/2013    Procedure: COLONOSCOPY;  Surgeon: Lafayette Dragon, MD;  Location: [facility];  Service: Endoscopy;  Laterality: N/A;  .  Esophagogastroduodenoscopy N/A 11/23/2013    Procedure: ESOPHAGOGASTRODUODENOSCOPY (EGD);  Surgeon: Jerene Bears, MD;  Location: [facility];  Service: Endoscopy;  Laterality: N/A;  . Appendectomy    . Left and right heart catheterization with coronary angiogram N/A 02/23/2014    Procedure: LEFT AND RIGHT HEART CATHETERIZATION WITH CORONARY ANGIOGRAM;  Surgeon: Leonie Man, MD;  Location: [facility] CATH LAB;  Service: Cardiovascular;  Laterality: N/A;   Family History  Problem Relation Age of Onset  . Diabetes Mother   . Hyperlipidemia Mother   . Stroke Mother   . Diabetes Father    History  Substance Use Topics  . Smoking status: Never Smoker   . Smokeless tobacco: Never Used  . Alcohol Use: Yes     Comment: occaisonal, last drink today   OB History    Gravida Para Term Preterm AB TAB SAB Ectopic Multiple Living   7 3   4  4   3      Review of Systems  Musculoskeletal: Positive for joint swelling and arthralgias. Negative for gait problem.  Skin: Positive for color change (bruising to LLE). Negative for rash and wound.  Neurological: Negative for weakness and numbness.      Allergies  Morphine and related; Tramadol; and Penicillins  Home Medications   Prior to Admission medications   Medication Sig Start Date End Date Taking?  Authorizing Provider  calcium-vitamin D (OSCAL WITH D) 500-200 MG-UNIT per tablet Take 1 tablet by mouth daily with breakfast. Patient not taking: Reported on 01/07/2015 12/15/14   Robbie Lis, MD  dicyclomine (BENTYL) 20 MG tablet Take 1 tablet (20 mg total) by mouth 2 (two) times daily. Patient not taking: Reported on 01/07/2015 12/09/14   Waynetta Pean, PA-C  hydrocortisone-pramoxine (PROCTOFOAM [facility]) rectal foam Place 1 applicator rectally 2 (two) times daily. Patient not taking: Reported on 01/07/2015 12/09/14   Waynetta Pean, PA-C  ibuprofen (ADVIL,MOTRIN) 200 MG tablet Take 400 mg by mouth every 6 (six) hours as needed for moderate pain (pain).     Historical Provider, MD  levETIRAcetam (KEPPRA) 1000 MG tablet Take 1 tablet (1,000 mg total) by mouth daily. For seizure activities Patient taking differently: Take 1,000 mg by mouth 2 (two) times daily. For seizure activities 07/04/14   Encarnacion Slates, NP  metroNIDAZOLE (FLAGYL) 500 MG tablet Take 1 tablet (500 mg total) by mouth 3 (three) times daily. Patient not taking: Reported on 01/07/2015 12/15/14   Robbie Lis, MD  mirtazapine (REMERON) 30 MG tablet Take 1 tablet (30 mg total) by mouth at bedtime. For depression/sleep Patient not taking: Reported on 01/07/2015 07/04/14   Encarnacion Slates, NP  ondansetron (ZOFRAN ODT) 4 MG disintegrating tablet Take 1 tablet (4 mg total) by mouth every 8 (eight) hours as needed for nausea or vomiting. Patient not taking: Reported on 01/07/2015 12/09/14   Waynetta Pean, PA-C  potassium chloride 20 MEQ TBCR Take 20 mEq by mouth daily. Patient not taking: Reported on 01/07/2015 12/15/14   Robbie Lis, MD  saccharomyces boulardii (FLORASTOR) 250 MG capsule Take 1 capsule (250 mg total) by mouth 2 (two) times daily. Patient not taking: Reported on 01/07/2015 12/15/14   Robbie Lis, MD   BP 111/73 mmHg  Pulse 82  Temp(Src) 98.2 F (36.8 C) (Oral)  Resp 20  Ht 5\' 6"  (1.676 m)  Wt 135 lb (61.236 kg)  BMI 21.80 kg/m2  SpO2 99% Physical Exam  Constitutional: She is oriented to person, place, and time. She appears well-developed and well-nourished. No distress.  HENT:  Head: Normocephalic and atraumatic.  Eyes: EOM are normal.  Neck: Neck supple. No tracheal deviation present.  Cardiovascular: Normal rate.   Pulmonary/Chest: Effort normal. No respiratory distress.  Musculoskeletal: Normal range of motion.  Swelling and bruising noted to the left anterior medial knee. Bruising extends down left shin both anteriorly and posteriorly. Ankle appears to be normal with full range of motion with no pain or tenderness to palpation. Full range of motion of the left knee  with pain. Negative anterior-posterior drawer signs. Unable to assess medial or  lateral stress due to pain. There is a pedal pulses intact and equal bilaterally.  Neurological: She is alert and oriented to person, place, and time.  Skin: Skin is warm and dry.  Psychiatric: She has a normal mood and affect. Her behavior is normal.  Nursing note and vitals reviewed.   ED Course  Procedures (including critical care time) DIAGNOSTIC STUDIES: Oxygen Saturation is 99% on RA, nl by my interpretation.    COORDINATION OF CARE: 12:07 PM-Discussed treatment plan with pt at bedside and pt agreed to plan.   Labs Review Labs Reviewed - No data to display  Imaging Review Dg Tibia/fibula Left  01/28/2015   CLINICAL DATA:  Patient tripped and fell.  EXAM: LEFT TIBIA AND FIBULA - 2 VIEW  COMPARISON:  None.  FINDINGS: Frontal and lateral views were obtained. There is no fracture or dislocation. Joint spaces appear intact. No abnormal periosteal reaction.  IMPRESSION: No fracture or dislocation.  No appreciable arthropathy.   Electronically Signed   By: Lowella Grip III M.D.   On: 01/28/2015 13:22   Dg Knee Complete 4 Views Left  01/28/2015   CLINICAL DATA:  Fall.  Initial evaluation.  EXAM: LEFT KNEE - COMPLETE 4+ VIEW  COMPARISON:  None.  FINDINGS: Mild diffuse degenerative change present. No fracture or dislocation.  IMPRESSION: No acute abnormality .   Electronically Signed   By: Marcello Moores  Register   On: 01/28/2015 13:22     EKG Interpretation None      MDM   Final diagnoses:  Knee contusion, left, initial encounter  Knee sprain, left, initial encounter    patient emergency department one week after a fall and left knee injury. She is ambulatory on that knee. The knee is bruised and swollen. X-rays negative. Differential includes contusion versus muscular or ligamentous injury. Patient requesting crutches, will give crutches, NSAIDs, Norco 10 tablets, will refer her to an orthopedic  specialist for further evaluation.  Filed Vitals:   01/28/15 1136 01/28/15 1346  BP: 111/73 107/57  Pulse: 82 70  Temp: 98.2 F (36.8 C) 97.9 F (36.6 C)  TempSrc: Oral Oral  Resp: 20 18  Height: 5\' 6"  (1.676 m)   Weight: 135 lb (61.236 kg)   SpO2: 99% 99%   I personally performed the services described in this documentation, which was scribed in my presence. The recorded information has been reviewed and is accurate.    Jeannett Senior, PA-C 01/28/15 [place], MD 02/01/15 1510

## 2015-01-28 NOTE — ED Notes (Signed)
Pt reports she tripped and fell . Pt hit Lt knee on concrete . Pt now reports pain and swelling to Lt knee . Bruising is present from knee to ankle.

## 2015-01-28 NOTE — ED Notes (Signed)
Pt willing to sign, registration in the chart and unable to get out of chart to do computer problems. Pt discharged home.

## 2015-01-28 NOTE — Discharge Instructions (Signed)
Naprosyn for pain and inflammation. norco for severe pain. Ice. Elevate. Follow up with orthopedics specialist.   Knee Sprain A knee sprain is a tear in one of the strong, fibrous tissues that connect the bones (ligaments) in your knee. The severity of the sprain depends on how much of the ligament is torn. The tear can be either partial or complete. CAUSES  Often, sprains are a result of a fall or injury. The force of the impact causes the fibers of your ligament to stretch too much. This excess tension causes the fibers of your ligament to tear. SIGNS AND SYMPTOMS  You may have some loss of motion in your knee. Other symptoms include:  Bruising.  Pain in the knee area.  Tenderness of the knee to the touch.  Swelling. DIAGNOSIS  To diagnose a knee sprain, your health care provider will physically examine your knee. Your health care provider may also suggest an X-ray exam of your knee to make sure no bones are broken. TREATMENT  If your ligament is only partially torn, treatment usually involves keeping the knee in a fixed position (immobilization) or bracing your knee for activities that require movement for several weeks. To do this, your health care provider will apply a bandage, cast, or splint to keep your knee from moving and to support your knee during movement until it heals. For a partially torn ligament, the healing process usually takes 4-6 weeks. If your ligament is completely torn, depending on which ligament it is, you may need surgery to reconnect the ligament to the bone or reconstruct it. After surgery, a cast or splint may be applied and will need to stay on your knee for 4-6 weeks while your ligament heals. HOME CARE INSTRUCTIONS  Keep your injured knee elevated to decrease swelling.  To ease pain and swelling, apply ice to the injured area:  Put ice in a plastic bag.  Place a towel between your skin and the bag.  Leave the ice on for 20 minutes, 2-3 times a  day.  Only take medicine for pain as directed by your health care provider.  Do not leave your knee unprotected until pain and stiffness go away (usually 4-6 weeks).  If you have a cast or splint, do not allow it to get wet. If you have been instructed not to remove it, cover it with a plastic bag when you shower or bathe. Do not swim.  Your health care provider may suggest exercises for you to do during your recovery to prevent or limit permanent weakness and stiffness. SEEK IMMEDIATE MEDICAL CARE IF:  Your cast or splint becomes damaged.  Your pain becomes worse.  You have significant pain, swelling, or numbness below the cast or splint. MAKE SURE YOU:  Understand these instructions.  Will watch your condition.  Will get help right away if you are not doing well or get worse. Document Released: 07/06/2005 Document Revised: 04/26/2013 Document Reviewed: 02/15/2013 [facility] Patient Information 2015 [place], [place]. This information is not intended to replace advice given to you by your health care provider. Make sure you discuss any questions you have with your health care provider.

## 2015-02-01 ENCOUNTER — Encounter (HOSPITAL_COMMUNITY): Payer: Self-pay | Admitting: Emergency Medicine

## 2015-02-01 ENCOUNTER — Emergency Department (HOSPITAL_COMMUNITY)
Admission: EM | Admit: 2015-02-01 | Discharge: 2015-02-01 | Disposition: A | Discharge status: Home / Self Care | Payer: Self-pay | Attending: Emergency Medicine | Admitting: Emergency Medicine

## 2015-02-01 DIAGNOSIS — Z791 Long term (current) use of non-steroidal anti-inflammatories (NSAID): Secondary | ICD-10-CM | POA: Insufficient documentation

## 2015-02-01 DIAGNOSIS — F419 Anxiety disorder, unspecified: Secondary | ICD-10-CM | POA: Insufficient documentation

## 2015-02-01 DIAGNOSIS — Z79899 Other long term (current) drug therapy: Secondary | ICD-10-CM | POA: Insufficient documentation

## 2015-02-01 DIAGNOSIS — Y929 Unspecified place or not applicable: Secondary | ICD-10-CM | POA: Insufficient documentation

## 2015-02-01 DIAGNOSIS — S8012XA Contusion of left lower leg, initial encounter: Secondary | ICD-10-CM | POA: Insufficient documentation

## 2015-02-01 DIAGNOSIS — S51852A Open bite of left forearm, initial encounter: Secondary | ICD-10-CM | POA: Insufficient documentation

## 2015-02-01 DIAGNOSIS — Z880 Allergy status to penicillin: Secondary | ICD-10-CM | POA: Insufficient documentation

## 2015-02-01 DIAGNOSIS — Y939 Activity, unspecified: Secondary | ICD-10-CM | POA: Insufficient documentation

## 2015-02-01 DIAGNOSIS — Z8742 Personal history of other diseases of the female genital tract: Secondary | ICD-10-CM | POA: Insufficient documentation

## 2015-02-01 DIAGNOSIS — Y999 Unspecified external cause status: Secondary | ICD-10-CM | POA: Insufficient documentation

## 2015-02-01 DIAGNOSIS — F329 Major depressive disorder, single episode, unspecified: Secondary | ICD-10-CM | POA: Insufficient documentation

## 2015-02-01 DIAGNOSIS — S01111A Laceration without foreign body of right eyelid and periocular area, initial encounter: Secondary | ICD-10-CM | POA: Insufficient documentation

## 2015-02-01 DIAGNOSIS — Z862 Personal history of diseases of the blood and blood-forming organs and certain disorders involving the immune mechanism: Secondary | ICD-10-CM | POA: Insufficient documentation

## 2015-02-01 DIAGNOSIS — Z8719 Personal history of other diseases of the digestive system: Secondary | ICD-10-CM | POA: Insufficient documentation

## 2015-02-01 DIAGNOSIS — Z8674 Personal history of sudden cardiac arrest: Secondary | ICD-10-CM | POA: Insufficient documentation

## 2015-02-01 DIAGNOSIS — W503XXA Accidental bite by another person, initial encounter: Secondary | ICD-10-CM

## 2015-02-01 DIAGNOSIS — T07XXXA Unspecified multiple injuries, initial encounter: Secondary | ICD-10-CM

## 2015-02-01 DIAGNOSIS — S5012XA Contusion of left forearm, initial encounter: Secondary | ICD-10-CM | POA: Insufficient documentation

## 2015-02-01 MED ORDER — OXYCODONE-ACETAMINOPHEN 5-325 MG PO TABS: 1.0000 | ORAL_TABLET | ORAL | Status: DC | PRN

## 2015-02-01 MED ORDER — OXYCODONE-ACETAMINOPHEN 5-325 MG PO TABS
1.0000 | ORAL_TABLET | Freq: Once | ORAL | Status: AC
  Administered 2015-02-01: 1 via ORAL
  Filled 2015-02-01: qty 1

## 2015-02-01 MED ORDER — IBUPROFEN 200 MG PO TABS
400.0000 mg | ORAL_TABLET | Freq: Once | ORAL | Status: AC
  Administered 2015-02-01: 400 mg via ORAL
  Filled 2015-02-01: qty 2

## 2015-02-01 MED ORDER — CLINDAMYCIN HCL 150 MG PO CAPS: 300.0000 mg | ORAL_CAPSULE | Freq: Three times a day (TID) | ORAL | Status: DC

## 2015-02-01 NOTE — Discharge Instructions (Signed)
Contusion A contusion is a deep bruise. Contusions are the result of an injury that caused bleeding under the skin. The contusion may turn blue, purple, or yellow. Minor injuries will give you a painless contusion, but more severe contusions may stay painful and swollen for a few weeks.  CAUSES  A contusion is usually caused by a blow, trauma, or direct force to an area of the body. SYMPTOMS   Swelling and redness of the injured area.  Bruising of the injured area.  Tenderness and soreness of the injured area.  Pain. DIAGNOSIS  The diagnosis can be made by taking a history and physical exam. An X-ray, CT scan, or MRI may be needed to determine if there were any associated injuries, such as fractures. TREATMENT  Specific treatment will depend on what area of the body was injured. In general, the best treatment for a contusion is resting, icing, elevating, and applying cold compresses to the injured area. Over-the-counter medicines may also be recommended for pain control. Ask your caregiver what the best treatment is for your contusion. HOME CARE INSTRUCTIONS   Put ice on the injured area.  Put ice in a plastic bag.  Place a towel between your skin and the bag.  Leave the ice on for 15-20 minutes, 3-4 times a day, or as directed by your health care provider.  Only take over-the-counter or prescription medicines for pain, discomfort, or fever as directed by your caregiver. Your caregiver may recommend avoiding anti-inflammatory medicines (aspirin, ibuprofen, and naproxen) for 48 hours because these medicines may increase bruising.  Rest the injured area.  If possible, elevate the injured area to reduce swelling. SEEK IMMEDIATE MEDICAL CARE IF:   You have increased bruising or swelling.  You have pain that is getting worse.  Your swelling or pain is not relieved with medicines. MAKE SURE YOU:   Understand these instructions.  Will watch your condition.  Will get help right  away if you are not doing well or get worse. Document Released: 04/15/2005 Document Revised: 07/11/2013 Document Reviewed: 05/11/2011 [facility] Patient Information 2015 [place], [place]. This information is not intended to replace advice given to you by your health care provider. Make sure you discuss any questions you have with your health care provider.  Facial Laceration  A facial laceration is a cut on the face. These injuries can be painful and cause bleeding. Lacerations usually heal quickly, but they need special care to reduce scarring. DIAGNOSIS  Your health care provider will take a medical history, ask for details about how the injury occurred, and examine the wound to determine how deep the cut is. TREATMENT  Some facial lacerations may not require closure. Others may not be able to be closed because of an increased risk of infection. The risk of infection and the chance for successful closure will depend on various factors, including the amount of time since the injury occurred. The wound may be cleaned to help prevent infection. If closure is appropriate, pain medicines may be given if needed. Your health care provider will use stitches (sutures), wound glue (adhesive), or skin adhesive strips to repair the laceration. These tools bring the skin edges together to allow for faster healing and a better cosmetic outcome. If needed, you may also be given a tetanus shot. HOME CARE INSTRUCTIONS  Only take over-the-counter or prescription medicines as directed by your health care provider.  Follow your health care provider's instructions for wound care. These instructions will vary depending on the technique  used for closing the wound. For Sutures:  Keep the wound clean and dry.   If you were given a bandage (dressing), you should change it at least once a day. Also change the dressing if it becomes wet or dirty, or as directed by your health care provider.   Wash the wound with soap and  water 2 times a day. Rinse the wound off with water to remove all soap. Pat the wound dry with a clean towel.   After cleaning, apply a thin layer of the antibiotic ointment recommended by your health care provider. This will help prevent infection and keep the dressing from sticking.   You may shower as usual after the first 24 hours. Do not soak the wound in water until the sutures are removed.   Get your sutures removed as directed by your health care provider. With facial lacerations, sutures should usually be taken out after 4-5 days to avoid stitch marks.   Wait a few days after your sutures are removed before applying any makeup. For Skin Adhesive Strips:  Keep the wound clean and dry.   Do not get the skin adhesive strips wet. You may bathe carefully, using caution to keep the wound dry.   If the wound gets wet, pat it dry with a clean towel.   Skin adhesive strips will fall off on their own. You may trim the strips as the wound heals. Do not remove skin adhesive strips that are still stuck to the wound. They will fall off in time.  For Wound Adhesive:  You may briefly wet your wound in the shower or bath. Do not soak or scrub the wound. Do not swim. Avoid periods of heavy sweating until the skin adhesive has fallen off on its own. After showering or bathing, gently pat the wound dry with a clean towel.   Do not apply liquid medicine, cream medicine, ointment medicine, or makeup to your wound while the skin adhesive is in place. This may loosen the film before your wound is healed.   If a dressing is placed over the wound, be careful not to apply tape directly over the skin adhesive. This may cause the adhesive to be pulled off before the wound is healed.   Avoid prolonged exposure to sunlight or tanning lamps while the skin adhesive is in place.  The skin adhesive will usually remain in place for 5-10 days, then naturally fall off the skin. Do not pick at the adhesive  film.  After Healing: Once the wound has healed, cover the wound with sunscreen during the day for 1 full year. This can help minimize scarring. Exposure to ultraviolet light in the first year will darken the scar. It can take 1-2 years for the scar to lose its redness and to heal completely.  SEEK IMMEDIATE MEDICAL CARE IF:  You have redness, pain, or swelling around the wound.   You see ayellowish-white fluid (pus) coming from the wound.   You have chills or a fever.  MAKE SURE YOU:  Understand these instructions.  Will watch your condition.  Will get help right away if you are not doing well or get worse. Document Released: 08/13/2004 Document Revised: 04/26/2013 Document Reviewed: 02/16/2013 [facility] Patient Information 2015 [place], [place]. This information is not intended to replace advice given to you by your health care provider. Make sure you discuss any questions you have with your health care provider.  Human Bite Human bite wounds tend to become infected, even  when they seem minor at first. Bite wounds of the hand can be serious because the tendons and joints are close to the skin. Infection can develop very rapidly, even in a matter of hours.  DIAGNOSIS  Your caregiver will most likely:  Take a detailed history of the bite injury.  Perform a wound exam.  Take your medical history. Blood tests or X-rays may be performed. Sometimes, infected bite wounds are cultured and sent to a lab to identify the infectious bacteria. TREATMENT  Medical treatment will depend on the location of the bite as well as the patient's medical history. Treatment may include:  Wound care, such as cleaning and flushing the wound with saline solution, bandaging, and elevating the affected area.  Antibiotic medicine.  Tetanus immunization.  Leaving the wound open to heal. This is often done with human bites due to the high risk of infection. However, in certain cases, wound closure with  stitches, wound adhesive, skin adhesive strips, or staples may be used. Infected bites that are left untreated may require intravenous (IV) antibiotics and surgical treatment in the hospital. [place]  Follow your caregiver's instructions for wound care.  Take all medicines as directed.  If your caregiver prescribes antibiotics, take them as directed. Finish them even if you start to feel better.  Follow up with your caregiver for further exams or immunizations as directed. You may need a tetanus shot if:  You cannot remember when you had your last tetanus shot.  You have never had a tetanus shot.  The injury broke your skin. If you get a tetanus shot, your arm may swell, get red, and feel warm to the touch. This is common and not a problem. If you need a tetanus shot and you choose not to have one, there is a rare chance of getting tetanus. Sickness from tetanus can be serious. SEEK IMMEDIATE MEDICAL CARE IF:  You have increased pain, swelling, or redness around the bite wound.  You have chills.  You have a fever.  You have pus draining from the wound.  You have red streaks on the skin coming from the wound.  You have pain with movement or trouble moving the injured part.  You are not improving, or you are getting worse.  You have any other questions or concerns. MAKE SURE YOU:  Understand these instructions.  Will watch your condition.  Will get help right away if you are not doing well or get worse. Document Released: 08/13/2004 Document Revised: 09/28/2011 Document Reviewed: 02/25/2011 [employer] Patient Information 2015 [place], [place]. This information is not intended to replace advice given to you by your health care provider. Make sure you discuss any questions you have with your health care provider.
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
## 2015-02-01 NOTE — ED Notes (Signed)
MD at bedside. 

## 2015-02-01 NOTE — ED Notes (Signed)
Pt visitor reported that pt was having a seizure. This RN went out to assess pt and she was shaking in the wheelchair. Pt started talking to this RN during her seizure and then started to have "another seizure" during which she was violently thrashing in the wheelchair. Pt assisted back up in the wheelchair and pt immediately started talking after the shaking ceased.

## 2015-02-20 ENCOUNTER — Emergency Department (HOSPITAL_COMMUNITY)
Admission: EM | Admit: 2015-02-20 | Discharge: 2015-02-21 | Disposition: A | Discharge status: Home / Self Care | Payer: Self-pay | Attending: Emergency Medicine | Admitting: Emergency Medicine

## 2015-02-20 ENCOUNTER — Encounter (HOSPITAL_COMMUNITY): Payer: Self-pay | Admitting: Emergency Medicine

## 2015-02-20 DIAGNOSIS — Z8719 Personal history of other diseases of the digestive system: Secondary | ICD-10-CM | POA: Insufficient documentation

## 2015-02-20 DIAGNOSIS — Z862 Personal history of diseases of the blood and blood-forming organs and certain disorders involving the immune mechanism: Secondary | ICD-10-CM | POA: Insufficient documentation

## 2015-02-20 DIAGNOSIS — Z880 Allergy status to penicillin: Secondary | ICD-10-CM | POA: Insufficient documentation

## 2015-02-20 DIAGNOSIS — Z8742 Personal history of other diseases of the female genital tract: Secondary | ICD-10-CM | POA: Insufficient documentation

## 2015-02-20 DIAGNOSIS — R509 Fever, unspecified: Secondary | ICD-10-CM | POA: Insufficient documentation

## 2015-02-20 DIAGNOSIS — Z79899 Other long term (current) drug therapy: Secondary | ICD-10-CM | POA: Insufficient documentation

## 2015-02-20 DIAGNOSIS — R079 Chest pain, unspecified: Secondary | ICD-10-CM | POA: Insufficient documentation

## 2015-02-20 DIAGNOSIS — R197 Diarrhea, unspecified: Secondary | ICD-10-CM | POA: Insufficient documentation

## 2015-02-20 DIAGNOSIS — Z8659 Personal history of other mental and behavioral disorders: Secondary | ICD-10-CM | POA: Insufficient documentation

## 2015-02-20 DIAGNOSIS — R0789 Other chest pain: Secondary | ICD-10-CM

## 2015-02-20 LAB — CBC WITH DIFFERENTIAL/PLATELET
Basophils Absolute: 0 10*3/uL (ref 0.0–0.1)
Basophils Relative: 1 % (ref 0–1)
Eosinophils Absolute: 0 10*3/uL (ref 0.0–0.7)
Eosinophils Relative: 0 % (ref 0–5)
HCT: 30.4 % — ABNORMAL LOW (ref 36.0–46.0)
Hemoglobin: 9.7 g/dL — ABNORMAL LOW (ref 12.0–15.0)
Lymphocytes Relative: 37 % (ref 12–46)
Lymphs Abs: 1.2 10*3/uL (ref 0.7–4.0)
MCH: 30.4 pg (ref 26.0–34.0)
MCHC: 31.9 g/dL (ref 30.0–36.0)
MCV: 95.3 fL (ref 78.0–100.0)
MONOS PCT: 12 % (ref 3–12)
Monocytes Absolute: 0.4 10*3/uL (ref 0.1–1.0)
NEUTROS ABS: 1.7 10*3/uL (ref 1.7–7.7)
Neutrophils Relative %: 50 % (ref 43–77)
PLATELETS: 122 10*3/uL — AB (ref 150–400)
RBC: 3.19 MIL/uL — ABNORMAL LOW (ref 3.87–5.11)
RDW: 23.6 % — ABNORMAL HIGH (ref 11.5–15.5)
WBC: 3.3 10*3/uL — ABNORMAL LOW (ref 4.0–10.5)

## 2015-02-20 LAB — COMPREHENSIVE METABOLIC PANEL
ALT: 66 U/L — AB (ref 14–54)
ANION GAP: 12 (ref 5–15)
AST: 316 U/L — AB (ref 15–41)
Albumin: 4.5 g/dL (ref 3.5–5.0)
Alkaline Phosphatase: 206 U/L — ABNORMAL HIGH (ref 38–126)
BILIRUBIN TOTAL: 0.4 mg/dL (ref 0.3–1.2)
CHLORIDE: 99 mmol/L — AB (ref 101–111)
CO2: 30 mmol/L (ref 22–32)
Calcium: 9.4 mg/dL (ref 8.9–10.3)
Creatinine, Ser: 0.42 mg/dL — ABNORMAL LOW (ref 0.44–1.00)
GFR calc non Af Amer: >60 mL/min (ref >60)
GLUCOSE: 115 mg/dL — AB (ref 65–99)
Potassium: 2.8 mmol/L — ABNORMAL LOW (ref 3.5–5.1)
SODIUM: 141 mmol/L (ref 135–145)
Total Protein: 7.3 g/dL (ref 6.5–8.1)

## 2015-02-20 LAB — LIPASE, BLOOD: Lipase: 24 U/L (ref 22–51)

## 2015-02-20 MED ORDER — SODIUM CHLORIDE 0.9 % IV BOLUS (SEPSIS)
1000.0000 mL | Freq: Once | INTRAVENOUS | Status: AC
  Administered 2015-02-20: 1000 mL via INTRAVENOUS

## 2015-02-20 MED ORDER — KETOROLAC TROMETHAMINE 30 MG/ML IJ SOLN
30.0000 mg | Freq: Once | INTRAMUSCULAR | Status: AC
  Administered 2015-02-20: 30 mg via INTRAVENOUS
  Filled 2015-02-20: qty 1

## 2015-02-20 NOTE — ED Provider Notes (Signed)
CSN: [id number]     Arrival date & time 02/20/15  2144 History   First MD Initiated Contact with Patient 02/20/15 2214     Chief Complaint  Patient presents with  . Abdominal Pain     (Consider location/radiation/quality/duration/timing/severity/associated sxs/prior Treatment) Patient is a 39 y.o. female presenting with abdominal pain. The history is provided by the patient.  Abdominal Pain Pain location:  LUQ Pain quality: aching   Pain radiates to:  Does not radiate Pain severity:  Moderate Onset quality:  Gradual Duration:  3 days Timing:  Intermittent Progression:  Waxing and waning Chronicity:  Recurrent Context: previous surgery (appy)   Context: not alcohol use, not eating, not sick contacts, not suspicious food intake and not trauma   Relieved by:  Nothing Worsened by:  Nothing tried Ineffective treatments:  None tried Associated symptoms: diarrhea and fever (to 104 yesterday, resolved)     Past Medical History  Diagnosis Date  . Proctitis   . Cysts of both ovaries   . Anemia   . Anxiety   . Blood transfusion without reported diagnosis   . Depression   . Fatty liver 10/05/13  . Cardiac arrest   . Seizures    Past Surgical History  Procedure Laterality Date  . Ovarian cyst removal    . Laparoscopy N/A 09/28/2013    Procedure: LAPAROSCOPY OPERATIVE;  Surgeon: Terrance Mass, MD;  Location: [facility];  Service: Gynecology;  Laterality: N/A;  . Laparoscopic appendectomy Right 09/28/2013    Procedure: APPENDECTOMY LAPAROSCOPIC;  Surgeon: Terrance Mass, MD;  Location: [facility];  Service: Gynecology;  Laterality: Right;  . Salpingoophorectomy Right 09/28/2013    Procedure: SALPINGO OOPHORECTOMY;  Surgeon: Terrance Mass, MD;  Location: [facility];  Service: Gynecology;  Laterality: Right;  . Colonoscopy N/A 09/30/2013    Procedure: COLONOSCOPY;  Surgeon: Lafayette Dragon, MD;  Location: [facility];  Service: Endoscopy;  Laterality: N/A;  . Esophagogastroduodenoscopy N/A  11/23/2013    Procedure: ESOPHAGOGASTRODUODENOSCOPY (EGD);  Surgeon: Jerene Bears, MD;  Location: [facility];  Service: Endoscopy;  Laterality: N/A;  . Appendectomy    . Left and right heart catheterization with coronary angiogram N/A 02/23/2014    Procedure: LEFT AND RIGHT HEART CATHETERIZATION WITH CORONARY ANGIOGRAM;  Surgeon: Leonie Man, MD;  Location: [facility];  Service: Cardiovascular;  Laterality: N/A;   Family History  Problem Relation Age of Onset  . Diabetes Mother   . Hyperlipidemia Mother   . Stroke Mother   . Diabetes Father    History  Substance Use Topics  . Smoking status: Never Smoker   . Smokeless tobacco: Never Used  . Alcohol Use: Yes     Comment: occaisonal, last drink today   OB History    Gravida Para Term Preterm AB TAB SAB Ectopic Multiple Living   7 3   4  4   3      Review of Systems  Constitutional: Positive for fever (to 104 yesterday, resolved).  Gastrointestinal: Positive for abdominal pain and diarrhea.  All other systems reviewed and are negative.     Allergies  Morphine and related; Tramadol; and Penicillins  Home Medications   Prior to Admission medications   Medication Sig Start Date End Date Taking? Authorizing Provider  calcium-vitamin D (OSCAL WITH D) 500-200 MG-UNIT per tablet Take 1 tablet by mouth daily with breakfast. Patient not taking: Reported on 01/07/2015 12/15/14   Robbie Lis, MD  clindamycin (CLEOCIN) 150 MG capsule Take  2 capsules (300 mg total) by mouth 3 (three) times daily. Patient not taking: Reported on 02/20/2015 02/01/15   Virgel Manifold, MD  dicyclomine (BENTYL) 20 MG tablet Take 1 tablet (20 mg total) by mouth 2 (two) times daily. Patient not taking: Reported on 01/07/2015 12/09/14   Waynetta Pean, PA-C  HYDROcodone-acetaminophen [facility]) 5-325 MG per tablet Take 1-2 tablets by mouth every 6 (six) hours as needed for moderate pain. Patient not taking: Reported on 02/01/2015 01/28/15   Jeannett Senior, PA-C   hydrocortisone-pramoxine (PROCTOFOAM HC) rectal foam Place 1 applicator rectally 2 (two) times daily. Patient not taking: Reported on 01/07/2015 12/09/14   Waynetta Pean, PA-C  levETIRAcetam (KEPPRA) 1000 MG tablet Take 1 tablet (1,000 mg total) by mouth daily. For seizure activities Patient not taking: Reported on 02/20/2015 07/04/14   Encarnacion Slates, NP  metroNIDAZOLE (FLAGYL) 500 MG tablet Take 1 tablet (500 mg total) by mouth 3 (three) times daily. Patient not taking: Reported on 01/07/2015 12/15/14   Robbie Lis, MD  mirtazapine (REMERON) 30 MG tablet Take 1 tablet (30 mg total) by mouth at bedtime. For depression/sleep Patient not taking: Reported on 01/07/2015 07/04/14   Encarnacion Slates, NP  naproxen (NAPROSYN) 500 MG tablet Take 1 tablet (500 mg total) by mouth 2 (two) times daily. Patient not taking: Reported on 02/01/2015 01/28/15   Tatyana Kirichenko, PA-C  ondansetron (ZOFRAN ODT) 4 MG disintegrating tablet Take 1 tablet (4 mg total) by mouth every 8 (eight) hours as needed for nausea or vomiting. Patient not taking: Reported on 01/07/2015 12/09/14   Waynetta Pean, PA-C  oxyCODONE-acetaminophen (PERCOCET/ROXICET) 5-325 MG per tablet Take 1 tablet by mouth every 4 (four) hours as needed for severe pain. Patient not taking: Reported on 02/20/2015 02/01/15   Virgel Manifold, MD  potassium chloride 20 MEQ TBCR Take 20 mEq by mouth daily. Patient not taking: Reported on 01/07/2015 12/15/14   Robbie Lis, MD  saccharomyces boulardii (FLORASTOR) 250 MG capsule Take 1 capsule (250 mg total) by mouth 2 (two) times daily. Patient not taking: Reported on 01/07/2015 12/15/14   Robbie Lis, MD   BP 104/86 mmHg  Pulse 100  Temp(Src) 98.9 F (37.2 C) (Oral)  SpO2 100%  LMP 01/28/2015 Physical Exam  Constitutional: She is oriented to person, place, and time. She appears well-developed and well-nourished. No distress.  HENT:  Head: Normocephalic.  Eyes: Conjunctivae are normal.  Neck: Neck supple. No  tracheal deviation present.  Cardiovascular: Normal rate and regular rhythm.   Pulmonary/Chest: Effort normal. No respiratory distress. She exhibits tenderness (point tender directly over xyphoid process without tenderness extending over the rib cage, chest wall, or abdomen  ).  Abdominal: Soft. She exhibits no distension. There is no tenderness. There is no rebound and no guarding.  Neurological: She is alert and oriented to person, place, and time.  Skin: Skin is warm and dry.  Psychiatric: She has a normal mood and affect.    ED Course  Procedures (including critical care time) Labs Review Labs Reviewed  CBC WITH DIFFERENTIAL/PLATELET - Abnormal; Notable for the following:    WBC 3.3 (*)    RBC 3.19 (*)    Hemoglobin 9.7 (*)    HCT 30.4 (*)    RDW 23.6 (*)    Platelets 122 (*)    All other components within normal limits  COMPREHENSIVE METABOLIC PANEL - Abnormal; Notable for the following:    Potassium 2.8 (*)    Chloride 99 (*)  Glucose, Bld 115 (*)    BUN <5 (*)    Creatinine, Ser 0.42 (*)    AST 316 (*)    ALT 66 (*)    Alkaline Phosphatase 206 (*)    All other components within normal limits  LIPASE, BLOOD  URINALYSIS, ROUTINE W REFLEX MICROSCOPIC (NOT AT [facility])  POC URINE PREG, ED    Imaging Review No results found.   EKG Interpretation None      MDM   Final diagnoses:  Xyphoidalgia    39 year old female presents with pain over her lower chest and upper abdomen that she describes occurring worse over the course of the day. She took a naproxen with minimal relief. On exam she is point tender over her xiphoid process without any abdominal tenderness, any chest wall tenderness, or any other concerning findings. Screening laboratory work for abdominal pathology is not consistent with any emergent diagnoses. Discussed supportive care with NSAIDs and follow up with primary care physician as needed.    Leo Grosser, MD 02/21/15 Laureen Abrahams

## 2015-02-20 NOTE — ED Notes (Signed)
Patient comes in with complaints of Left upper Quadrat pain.  Patient states she has had n/v/d for 38 hours. Patient states she spitting up blood . Patient states she has npt been able to keep anything down. Patient intal crying then stop and starting talking.

## 2015-02-20 NOTE — ED Notes (Signed)
PA made aware patient request for only dilaudid for pain.

## 2015-02-21 NOTE — Discharge Instructions (Signed)

## 2015-02-23 ENCOUNTER — Emergency Department (HOSPITAL_COMMUNITY): Payer: Self-pay

## 2015-02-23 ENCOUNTER — Inpatient Hospital Stay (HOSPITAL_COMMUNITY)
Admission: EM | Admit: 2015-02-23 | Discharge: 2015-02-27 | DRG: 896 | Disposition: A | Discharge status: Home / Self Care | Payer: Self-pay | Attending: Internal Medicine | Admitting: Internal Medicine

## 2015-02-23 ENCOUNTER — Encounter (HOSPITAL_COMMUNITY): Payer: Self-pay | Admitting: Emergency Medicine

## 2015-02-23 DIAGNOSIS — A0472 Enterocolitis due to Clostridium difficile, not specified as recurrent: Secondary | ICD-10-CM | POA: Diagnosis present

## 2015-02-23 DIAGNOSIS — F10929 Alcohol use, unspecified with intoxication, unspecified: Secondary | ICD-10-CM | POA: Insufficient documentation

## 2015-02-23 DIAGNOSIS — Z8674 Personal history of sudden cardiac arrest: Secondary | ICD-10-CM

## 2015-02-23 DIAGNOSIS — I4901 Ventricular fibrillation: Secondary | ICD-10-CM | POA: Diagnosis present

## 2015-02-23 DIAGNOSIS — D72819 Decreased white blood cell count, unspecified: Secondary | ICD-10-CM

## 2015-02-23 DIAGNOSIS — K7030 Alcoholic cirrhosis of liver without ascites: Secondary | ICD-10-CM | POA: Diagnosis present

## 2015-02-23 DIAGNOSIS — Z823 Family history of stroke: Secondary | ICD-10-CM

## 2015-02-23 DIAGNOSIS — Y908 Blood alcohol level of 240 mg/100 ml or more: Secondary | ICD-10-CM | POA: Diagnosis present

## 2015-02-23 DIAGNOSIS — A047 Enterocolitis due to Clostridium difficile: Secondary | ICD-10-CM | POA: Diagnosis present

## 2015-02-23 DIAGNOSIS — F10129 Alcohol abuse with intoxication, unspecified: Secondary | ICD-10-CM

## 2015-02-23 DIAGNOSIS — K7290 Hepatic failure, unspecified without coma: Secondary | ICD-10-CM | POA: Diagnosis present

## 2015-02-23 DIAGNOSIS — Z638 Other specified problems related to primary support group: Secondary | ICD-10-CM

## 2015-02-23 DIAGNOSIS — Z880 Allergy status to penicillin: Secondary | ICD-10-CM

## 2015-02-23 DIAGNOSIS — F4321 Adjustment disorder with depressed mood: Secondary | ICD-10-CM | POA: Diagnosis present

## 2015-02-23 DIAGNOSIS — G9340 Encephalopathy, unspecified: Secondary | ICD-10-CM | POA: Diagnosis present

## 2015-02-23 DIAGNOSIS — K7682 Hepatic encephalopathy: Secondary | ICD-10-CM | POA: Diagnosis present

## 2015-02-23 DIAGNOSIS — K648 Other hemorrhoids: Secondary | ICD-10-CM | POA: Diagnosis present

## 2015-02-23 DIAGNOSIS — R404 Transient alteration of awareness: Secondary | ICD-10-CM

## 2015-02-23 DIAGNOSIS — K6289 Other specified diseases of anus and rectum: Secondary | ICD-10-CM | POA: Diagnosis present

## 2015-02-23 DIAGNOSIS — F1010 Alcohol abuse, uncomplicated: Secondary | ICD-10-CM | POA: Diagnosis present

## 2015-02-23 DIAGNOSIS — Z833 Family history of diabetes mellitus: Secondary | ICD-10-CM

## 2015-02-23 DIAGNOSIS — Z885 Allergy status to narcotic agent status: Secondary | ICD-10-CM

## 2015-02-23 DIAGNOSIS — F10229 Alcohol dependence with intoxication, unspecified: Principal | ICD-10-CM | POA: Diagnosis present

## 2015-02-23 DIAGNOSIS — K760 Fatty (change of) liver, not elsewhere classified: Secondary | ICD-10-CM | POA: Diagnosis present

## 2015-02-23 DIAGNOSIS — G40909 Epilepsy, unspecified, not intractable, without status epilepticus: Secondary | ICD-10-CM | POA: Diagnosis present

## 2015-02-23 DIAGNOSIS — D61818 Other pancytopenia: Secondary | ICD-10-CM | POA: Diagnosis present

## 2015-02-23 DIAGNOSIS — R569 Unspecified convulsions: Secondary | ICD-10-CM

## 2015-02-23 DIAGNOSIS — F10239 Alcohol dependence with withdrawal, unspecified: Secondary | ICD-10-CM | POA: Diagnosis present

## 2015-02-23 DIAGNOSIS — Z79899 Other long term (current) drug therapy: Secondary | ICD-10-CM

## 2015-02-23 DIAGNOSIS — E876 Hypokalemia: Secondary | ICD-10-CM | POA: Diagnosis present

## 2015-02-23 HISTORY — DX: Alcohol abuse, uncomplicated: F10.10

## 2015-02-23 LAB — COMPREHENSIVE METABOLIC PANEL
ALBUMIN: 4.4 g/dL (ref 3.5–5.0)
ALT: 41 U/L (ref 14–54)
AST: 112 U/L — ABNORMAL HIGH (ref 15–41)
Alkaline Phosphatase: 190 U/L — ABNORMAL HIGH (ref 38–126)
Anion gap: 11 (ref 5–15)
BUN: <5 mg/dL — ABNORMAL LOW (ref 6–20)
CO2: 27 mmol/L (ref 22–32)
CREATININE: 0.3 mg/dL — AB (ref 0.44–1.00)
Calcium: 8.5 mg/dL — ABNORMAL LOW (ref 8.9–10.3)
Chloride: 105 mmol/L (ref 101–111)
GFR calc Af Amer: >60 mL/min (ref >60)
GFR calc non Af Amer: >60 mL/min (ref >60)
Glucose, Bld: 116 mg/dL — ABNORMAL HIGH (ref 65–99)
Potassium: 3.2 mmol/L — ABNORMAL LOW (ref 3.5–5.1)
SODIUM: 143 mmol/L (ref 135–145)
Total Bilirubin: 0.9 mg/dL (ref 0.3–1.2)
Total Protein: 7.5 g/dL (ref 6.5–8.1)

## 2015-02-23 LAB — URINALYSIS, ROUTINE W REFLEX MICROSCOPIC
Bilirubin Urine: NEGATIVE
Glucose, UA: NEGATIVE mg/dL
Hgb urine dipstick: NEGATIVE
KETONES UR: NEGATIVE mg/dL
Leukocytes, UA: NEGATIVE
NITRITE: NEGATIVE
PH: 7 (ref 5.0–8.0)
Protein, ur: NEGATIVE mg/dL
SPECIFIC GRAVITY, URINE: 1.009 (ref 1.005–1.030)
UROBILINOGEN UA: 1 mg/dL (ref 0.0–1.0)

## 2015-02-23 LAB — CBC WITH DIFFERENTIAL/PLATELET
Basophils Absolute: 0 10*3/uL (ref 0.0–0.1)
Basophils Relative: 1 % (ref 0–1)
Eosinophils Absolute: 0 10*3/uL (ref 0.0–0.7)
Eosinophils Relative: 1 % (ref 0–5)
HCT: 29.4 % — ABNORMAL LOW (ref 36.0–46.0)
Hemoglobin: 9.4 g/dL — ABNORMAL LOW (ref 12.0–15.0)
Lymphocytes Relative: 32 % (ref 12–46)
Lymphs Abs: 0.7 10*3/uL (ref 0.7–4.0)
MCH: 30.5 pg (ref 26.0–34.0)
MCHC: 32 g/dL (ref 30.0–36.0)
MCV: 95.5 fL (ref 78.0–100.0)
MONO ABS: 0.2 10*3/uL (ref 0.1–1.0)
Monocytes Relative: 10 % (ref 3–12)
NEUTROS PCT: 56 % (ref 43–77)
Neutro Abs: 1.2 10*3/uL — ABNORMAL LOW (ref 1.7–7.7)
PLATELETS: 106 10*3/uL — AB (ref 150–400)
RBC: 3.08 MIL/uL — ABNORMAL LOW (ref 3.87–5.11)
RDW: 23.9 % — ABNORMAL HIGH (ref 11.5–15.5)
WBC: 2.1 10*3/uL — AB (ref 4.0–10.5)

## 2015-02-23 LAB — I-STAT CHEM 8, ED
BUN: <3 mg/dL — ABNORMAL LOW (ref 6–20)
CREATININE: 1.1 mg/dL — AB (ref 0.44–1.00)
Calcium, Ion: 0.94 mmol/L — ABNORMAL LOW (ref 1.12–1.23)
Chloride: 103 mmol/L (ref 101–111)
GLUCOSE: 117 mg/dL — AB (ref 65–99)
HCT: 41 % (ref 36.0–46.0)
Hemoglobin: 13.9 g/dL (ref 12.0–15.0)
Potassium: 3.3 mmol/L — ABNORMAL LOW (ref 3.5–5.1)
Sodium: 143 mmol/L (ref 135–145)
TCO2: 26 mmol/L (ref 0–100)

## 2015-02-23 LAB — RAPID URINE DRUG SCREEN, HOSP PERFORMED
Amphetamines: NOT DETECTED
BARBITURATES: NOT DETECTED
Benzodiazepines: NOT DETECTED
Cocaine: POSITIVE — AB
Opiates: NOT DETECTED
TETRAHYDROCANNABINOL: NOT DETECTED

## 2015-02-23 LAB — I-STAT BETA HCG BLOOD, ED (MC, WL, AP ONLY)

## 2015-02-23 LAB — AMMONIA: AMMONIA: 96 umol/L — AB (ref 9–35)

## 2015-02-23 LAB — ETHANOL: Alcohol, Ethyl (B): 432 mg/dL (ref <5)

## 2015-02-23 MED ORDER — SACCHAROMYCES BOULARDII 250 MG PO CAPS
250.0000 mg | ORAL_CAPSULE | Freq: Two times a day (BID) | ORAL | Status: DC
  Administered 2015-02-24 – 2015-02-26 (×6): 250 mg via ORAL
  Filled 2015-02-23 (×8): qty 1

## 2015-02-23 MED ORDER — HYDROCORTISONE ACE-PRAMOXINE 1-1 % RE FOAM
1.0000 | Freq: Two times a day (BID) | RECTAL | Status: DC
  Administered 2015-02-24 – 2015-02-26 (×5): 1 via RECTAL
  Filled 2015-02-23: qty 10

## 2015-02-23 MED ORDER — LACTULOSE 10 GM/15ML PO SOLN: 20.0000 g | Freq: Three times a day (TID) | ORAL | Status: DC

## 2015-02-23 MED ORDER — ONDANSETRON HCL 4 MG PO TABS
4.0000 mg | ORAL_TABLET | Freq: Four times a day (QID) | ORAL | Status: DC | PRN
  Administered 2015-02-25 (×2): 4 mg via ORAL
  Filled 2015-02-23 (×3): qty 1

## 2015-02-23 MED ORDER — THIAMINE HCL 100 MG/ML IJ SOLN
Freq: Once | INTRAVENOUS | Status: AC
  Administered 2015-02-24: 01:00:00 via INTRAVENOUS
  Filled 2015-02-23: qty 1000

## 2015-02-23 MED ORDER — LEVETIRACETAM 500 MG PO TABS: 1000.0000 mg | ORAL_TABLET | Freq: Two times a day (BID) | ORAL | Status: DC

## 2015-02-23 MED ORDER — CALCIUM CARBONATE-VITAMIN D 500-200 MG-UNIT PO TABS
1.0000 | ORAL_TABLET | Freq: Every day | ORAL | Status: DC
  Administered 2015-02-24 – 2015-02-27 (×4): 1 via ORAL
  Filled 2015-02-23 (×5): qty 1

## 2015-02-23 MED ORDER — SODIUM CHLORIDE 0.9 % IV SOLN
INTRAVENOUS | Status: DC
  Administered 2015-02-24 (×2): via INTRAVENOUS

## 2015-02-23 MED ORDER — POTASSIUM CHLORIDE 10 MEQ/100ML IV SOLN
10.0000 meq | INTRAVENOUS | Status: AC
Start: 2015-02-23 — End: 2015-02-24
  Administered 2015-02-24 (×2): 10 meq via INTRAVENOUS
  Filled 2015-02-23: qty 100

## 2015-02-23 MED ORDER — LORAZEPAM 2 MG/ML IJ SOLN
2.0000 mg | INTRAMUSCULAR | Status: DC | PRN
  Administered 2015-02-24 (×5): 2 mg via INTRAVENOUS
  Filled 2015-02-23 (×5): qty 1

## 2015-02-23 MED ORDER — OXYCODONE HCL 5 MG PO TABS
5.0000 mg | ORAL_TABLET | ORAL | Status: DC | PRN
  Administered 2015-02-24 – 2015-02-27 (×10): 5 mg via ORAL
  Filled 2015-02-23 (×12): qty 1

## 2015-02-23 MED ORDER — LACTULOSE 10 GM/15ML PO SOLN
10.0000 g | Freq: Once | ORAL | Status: AC
  Administered 2015-02-23: 10 g via ORAL
  Filled 2015-02-23: qty 15

## 2015-02-23 MED ORDER — SODIUM CHLORIDE 0.9 % IV SOLN
1000.0000 mg | Freq: Once | INTRAVENOUS | Status: AC
  Administered 2015-02-24: 1000 mg via INTRAVENOUS
  Filled 2015-02-23: qty 10

## 2015-02-23 MED ORDER — SODIUM CHLORIDE 0.9 % IV SOLN: INTRAVENOUS | Status: DC

## 2015-02-23 MED ORDER — LEVETIRACETAM 500 MG PO TABS
1000.0000 mg | ORAL_TABLET | Freq: Two times a day (BID) | ORAL | Status: DC
  Administered 2015-02-24 – 2015-02-26 (×6): 1000 mg via ORAL
  Filled 2015-02-23 (×8): qty 2

## 2015-02-23 MED ORDER — ALUM & MAG HYDROXIDE-SIMETH 200-200-20 MG/5ML PO SUSP: 30.0000 mL | Freq: Four times a day (QID) | ORAL | Status: DC | PRN

## 2015-02-23 MED ORDER — ONDANSETRON HCL 4 MG/2ML IJ SOLN
4.0000 mg | Freq: Four times a day (QID) | INTRAMUSCULAR | Status: DC | PRN
  Administered 2015-02-24 (×2): 4 mg via INTRAVENOUS
  Filled 2015-02-23 (×2): qty 2
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
## 2015-02-23 NOTE — ED Notes (Signed)
Pt's contacts:  Cassandra---- [place]---- [phone]

## 2015-02-23 NOTE — ED Notes (Signed)
Bed: OH72 Expected date:  Expected time:  Means of arrival:  Comments: EMS--ETOH/combative

## 2015-02-24 LAB — CBC
HCT: 24.7 % — ABNORMAL LOW (ref 36.0–46.0)
HEMOGLOBIN: 7.8 g/dL — AB (ref 12.0–15.0)
MCH: 30.4 pg (ref 26.0–34.0)
MCHC: 31.6 g/dL (ref 30.0–36.0)
MCV: 96.1 fL (ref 78.0–100.0)
Platelets: 82 10*3/uL — ABNORMAL LOW (ref 150–400)
RBC: 2.57 MIL/uL — AB (ref 3.87–5.11)
RDW: 24 % — AB (ref 11.5–15.5)
WBC: 1.9 10*3/uL — ABNORMAL LOW (ref 4.0–10.5)

## 2015-02-24 LAB — BASIC METABOLIC PANEL
ANION GAP: 7 (ref 5–15)
BUN: 7 mg/dL (ref 6–20)
CO2: 26 mmol/L (ref 22–32)
CREATININE: 0.43 mg/dL — AB (ref 0.44–1.00)
Calcium: 8.2 mg/dL — ABNORMAL LOW (ref 8.9–10.3)
Chloride: 111 mmol/L (ref 101–111)
GFR calc Af Amer: >60 mL/min (ref >60)
GFR calc non Af Amer: >60 mL/min (ref >60)
Glucose, Bld: 100 mg/dL — ABNORMAL HIGH (ref 65–99)
POTASSIUM: 4.1 mmol/L (ref 3.5–5.1)
SODIUM: 144 mmol/L (ref 135–145)

## 2015-02-24 LAB — LIPASE, BLOOD: Lipase: 19 U/L — ABNORMAL LOW (ref 22–51)

## 2015-02-24 LAB — MAGNESIUM
MAGNESIUM: 1.8 mg/dL (ref 1.7–2.4)
MAGNESIUM: 1.7 mg/dL (ref 1.7–2.4)

## 2015-02-24 LAB — MRSA PCR SCREENING: MRSA by PCR: NEGATIVE

## 2015-02-24 LAB — HIV ANTIBODY (ROUTINE TESTING W REFLEX): HIV Screen 4th Generation wRfx: NONREACTIVE

## 2015-02-24 MED ORDER — CARVEDILOL 3.125 MG PO TABS
1.6000 mg | ORAL_TABLET | Freq: Two times a day (BID) | ORAL | Status: DC
  Administered 2015-02-24 – 2015-02-27 (×7): 1.5625 mg via ORAL
  Filled 2015-02-24: qty 1
  Filled 2015-02-24 (×4): qty 0.5
  Filled 2015-02-24 (×2): qty 1
  Filled 2015-02-24 (×2): qty 0.5

## 2015-02-24 MED ORDER — LACTULOSE 10 GM/15ML PO SOLN
20.0000 g | Freq: Every day | ORAL | Status: DC | PRN
  Filled 2015-02-24: qty 30

## 2015-02-24 MED ORDER — OXYCODONE-ACETAMINOPHEN 5-325 MG PO TABS
1.0000 | ORAL_TABLET | ORAL | Status: DC | PRN
  Administered 2015-02-24 – 2015-02-27 (×12): 1 via ORAL
  Filled 2015-02-24 (×12): qty 1
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
## 2015-02-24 NOTE — Progress Notes (Signed)
Patient trying to get out of bed to use bathroom. Nurse explained to patient that she is a extremely  High fall risk due to fall, seizure, cocaine use and ETOH that all happened today. Nurse explained to patient it is unsafe to get her up due to risk of fall and because of [employer] [employer]. Nurse explained that bedpan can be used as alternative. Patient extremely confrontational, agitated and uncooperative. Threatening to leave AMA. Patient stating she doesn't need to follow nursing instruction and continually trying to get out of bed. Patient pulling of monitor leads and trying to pull out IV. [facility] manager notified.

## 2015-02-25 LAB — DIFFERENTIAL
Basophils Absolute: 0 10*3/uL (ref 0.0–0.1)
Basophils Relative: 1 % (ref 0–1)
EOS PCT: 1 % (ref 0–5)
Eosinophils Absolute: 0 10*3/uL (ref 0.0–0.7)
LYMPHS ABS: 0.6 10*3/uL — AB (ref 0.7–4.0)
Lymphocytes Relative: 30 % (ref 12–46)
Monocytes Absolute: 0.3 10*3/uL (ref 0.1–1.0)
Monocytes Relative: 17 % — ABNORMAL HIGH (ref 3–12)
NEUTROS PCT: 51 % (ref 43–77)
Neutro Abs: 1.1 10*3/uL — ABNORMAL LOW (ref 1.7–7.7)

## 2015-02-25 LAB — CBC
HCT: 28.2 % — ABNORMAL LOW (ref 36.0–46.0)
Hemoglobin: 8.6 g/dL — ABNORMAL LOW (ref 12.0–15.0)
MCH: 29.6 pg (ref 26.0–34.0)
MCHC: 30.5 g/dL (ref 30.0–36.0)
MCV: 96.9 fL (ref 78.0–100.0)
Platelets: 61 10*3/uL — ABNORMAL LOW (ref 150–400)
RBC: 2.91 MIL/uL — AB (ref 3.87–5.11)
RDW: 22.4 % — ABNORMAL HIGH (ref 11.5–15.5)
WBC: 2 10*3/uL — ABNORMAL LOW (ref 4.0–10.5)

## 2015-02-25 LAB — COMPREHENSIVE METABOLIC PANEL
ALT: 44 U/L (ref 14–54)
AST: 210 U/L — AB (ref 15–41)
Albumin: 4 g/dL (ref 3.5–5.0)
Alkaline Phosphatase: 214 U/L — ABNORMAL HIGH (ref 38–126)
Anion gap: 6 (ref 5–15)
CHLORIDE: 100 mmol/L — AB (ref 101–111)
CO2: 25 mmol/L (ref 22–32)
Calcium: 7.9 mg/dL — ABNORMAL LOW (ref 8.9–10.3)
Creatinine, Ser: 0.45 mg/dL (ref 0.44–1.00)
GFR calc Af Amer: >60 mL/min (ref >60)
GFR calc non Af Amer: >60 mL/min (ref >60)
Glucose, Bld: 96 mg/dL (ref 65–99)
Potassium: 3.5 mmol/L (ref 3.5–5.1)
Sodium: 131 mmol/L — ABNORMAL LOW (ref 135–145)
Total Bilirubin: 2.5 mg/dL — ABNORMAL HIGH (ref 0.3–1.2)
Total Protein: 6.8 g/dL (ref 6.5–8.1)

## 2015-02-25 MED ORDER — ONDANSETRON 4 MG PO TBDP
4.0000 mg | ORAL_TABLET | Freq: Once | ORAL | Status: AC
  Administered 2015-02-25: 4 mg via ORAL
  Filled 2015-02-25: qty 1

## 2015-02-25 NOTE — Care Management Note (Signed)
Case Management Note  Patient Details  Name: Kristen Roberson MRN: [id number] Date of Birth: [birth year]  Subjective/Objective:              etoh withdrawal with seizures      Action/Plan:Date:  February 25, 2015 U.R. performed for needs and level of care. Will continue to follow for Case Management needs.  Velva Harman, RN, BSN, [place]   [phone]   Expected Discharge Date:  02/26/15               Expected Discharge Plan:  Home/Self Care  In-House Referral:  Clinical Social Work  Discharge planning Services  CM Consult  Post Acute Care Choice:  NA Choice offered to:  NA  DME Arranged:  N/A DME Agency:  NA  HH Arranged:  NA HH Agency:  NA  Status of Service:  In process, will continue to follow  Medicare Important Message Given:    Date Medicare IM Given:    Medicare IM give by:    Date Additional Medicare IM Given:    Additional Medicare Important Message give by:     If discussed at [place] of Stay Meetings, dates discussed:    Additional Comments:  Leeroy Cha, RN 02/25/2015, 10:54 AM
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
## 2015-02-25 NOTE — Progress Notes (Signed)
Report called to med/surg floor 3W going to room 1337 spoke to [facility]

## 2015-02-25 NOTE — Evaluation (Signed)
Physical Therapy Evaluation Patient Details Name: Kristen Roberson MRN: [id number] DOB: Jul 18, 1976 Today's Date: 02/25/2015   History of Present Illness  39 yo female admitted with seizures, hepatic encephalopathy. Hx of seizures, ETOH abuse, cardiac arrest, liver disease.   Clinical Impression  On eval, pt required Min guard assist for mobility-walked ~250 feet. Unsteady at times but no overt LOB. Encouraged ambulation with nursing supervision for increased mobility. Do not anticipate any follow up PT needs at discharge.    Follow Up Recommendations No PT follow up;Supervision - Intermittent    Equipment Recommendations  None recommended by PT    Recommendations for Other Services       Precautions / Restrictions Precautions Precautions: Fall Precaution Comments: seizure Restrictions Weight Bearing Restrictions: No      Mobility  Bed Mobility Overal bed mobility: Modified Independent                Transfers Overall transfer level: Needs assistance   Transfers: Sit to/from Stand Sit to Stand: Supervision         General transfer comment: for safety. lightheaded and unsteady with initial standing  Ambulation/Gait Ambulation/Gait assistance: Min guard Ambulation Distance (Feet): 250 Feet Assistive device: None Gait Pattern/deviations: Step-through pattern;Decreased stride length     General Gait Details: unsteady but no overt LOB. close guard for safety  Stairs            Wheelchair Mobility    Modified Rankin (Stroke Patients Only)       Balance                                             Pertinent Vitals/Pain Pain Assessment: 0-10 Pain Score: 7  Pain Location: back, L side Pain Descriptors / Indicators: Aching;Sore Pain Intervention(s): Monitored during session;Repositioned    Home Living Family/patient expects to be discharged to:: Private residence Living Arrangements: Alone   Type of Home: Apartment Home Access:  Stairs to enter Entrance Stairs-Rails: Psychiatric nurse of Steps: 2 flights Home Layout: One level Home Equipment: None      Prior Function Level of Independence: Independent               Hand Dominance        Extremity/Trunk Assessment   Upper Extremity Assessment: Overall WFL for tasks assessed           Lower Extremity Assessment: Generalized weakness      Cervical / Trunk Assessment: Normal  Communication   Communication: No difficulties  Cognition Arousal/Alertness: Awake/alert Behavior During Therapy: WFL for tasks assessed/performed Overall Cognitive Status: Within Functional Limits for tasks assessed                      General Comments      Exercises        Assessment/Plan    PT Assessment Patient needs continued PT services  PT Diagnosis Generalized weakness;Acute pain   PT Problem List Decreased strength;Decreased activity tolerance;Decreased balance;Decreased mobility;Pain  PT Treatment Interventions Gait training;Functional mobility training;Therapeutic activities;Patient/family education;Balance training;Therapeutic exercise;Stair training   PT Goals (Current goals can be found in the Care Plan section) Acute Rehab PT Goals Patient Stated Goal: home soon PT Goal Formulation: With patient Time For Goal Achievement: 03/11/15 Potential to Achieve Goals: Good    Frequency Min 3X/week   Barriers to discharge  Co-evaluation               End of Session Equipment Utilized During Treatment: Gait belt Activity Tolerance: Patient limited by fatigue Patient left: in bed;with call bell/phone within reach           Time: 1042-1052 PT Time Calculation (min) (ACUTE ONLY): 10 min   Charges:   PT Evaluation $Initial PT Evaluation Tier I: 1 Procedure     PT G Codes:        Weston Anna, MPT Pager: [phone]

## 2015-02-26 LAB — CBC WITH DIFFERENTIAL/PLATELET
Basophils Absolute: 0 10*3/uL (ref 0.0–0.1)
Basophils Relative: 1 % (ref 0–1)
EOS ABS: 0.1 10*3/uL (ref 0.0–0.7)
Eosinophils Relative: 2 % (ref 0–5)
HCT: 30.2 % — ABNORMAL LOW (ref 36.0–46.0)
Hemoglobin: 9.2 g/dL — ABNORMAL LOW (ref 12.0–15.0)
LYMPHS ABS: 0.6 10*3/uL — AB (ref 0.7–4.0)
Lymphocytes Relative: 20 % (ref 12–46)
MCH: 29.1 pg (ref 26.0–34.0)
MCHC: 30.5 g/dL (ref 30.0–36.0)
MCV: 95.6 fL (ref 78.0–100.0)
MONOS PCT: 17 % — AB (ref 3–12)
Monocytes Absolute: 0.5 10*3/uL (ref 0.1–1.0)
NEUTROS ABS: 1.6 10*3/uL — AB (ref 1.7–7.7)
Neutrophils Relative %: 60 % (ref 43–77)
Platelets: 88 10*3/uL — ABNORMAL LOW (ref 150–400)
RBC: 3.16 MIL/uL — AB (ref 3.87–5.11)
RDW: 21.9 % — ABNORMAL HIGH (ref 11.5–15.5)
WBC: 2.8 10*3/uL — ABNORMAL LOW (ref 4.0–10.5)

## 2015-02-26 LAB — COMPREHENSIVE METABOLIC PANEL
ALBUMIN: 4.3 g/dL (ref 3.5–5.0)
ALT: 43 U/L (ref 14–54)
AST: 175 U/L — AB (ref 15–41)
Alkaline Phosphatase: 231 U/L — ABNORMAL HIGH (ref 38–126)
Anion gap: 9 (ref 5–15)
BUN: <5 mg/dL — ABNORMAL LOW (ref 6–20)
CO2: 26 mmol/L (ref 22–32)
CREATININE: 0.43 mg/dL — AB (ref 0.44–1.00)
Calcium: 8.8 mg/dL — ABNORMAL LOW (ref 8.9–10.3)
Chloride: 99 mmol/L — ABNORMAL LOW (ref 101–111)
GFR calc Af Amer: >60 mL/min (ref >60)
GFR calc non Af Amer: >60 mL/min (ref >60)
Glucose, Bld: 92 mg/dL (ref 65–99)
Potassium: 3.3 mmol/L — ABNORMAL LOW (ref 3.5–5.1)
SODIUM: 134 mmol/L — AB (ref 135–145)
Total Bilirubin: 1 mg/dL (ref 0.3–1.2)
Total Protein: 7.5 g/dL (ref 6.5–8.1)

## 2015-02-26 LAB — PROTIME-INR
INR: 1.11 (ref 0.00–1.49)
Prothrombin Time: 14.5 seconds (ref 11.6–15.2)

## 2015-02-26 MED ORDER — CARVEDILOL 3.125 MG PO TABS: 1.6000 mg | ORAL_TABLET | Freq: Two times a day (BID) | ORAL | Status: DC

## 2015-02-26 MED ORDER — RANITIDINE HCL 150 MG/10ML PO SYRP
150.0000 mg | ORAL_SOLUTION | Freq: Two times a day (BID) | ORAL | Status: DC
  Administered 2015-02-26 (×2): 150 mg via ORAL
  Filled 2015-02-26 (×4): qty 10

## 2015-02-26 MED ORDER — FAMOTIDINE 40 MG/5ML PO SUSR: 20.0000 mg | Freq: Two times a day (BID) | ORAL | Status: DC

## 2015-02-26 MED ORDER — SUCRALFATE 1 GM/10ML PO SUSP
1.0000 g | Freq: Three times a day (TID) | ORAL | Status: DC
  Administered 2015-02-26 – 2015-02-27 (×4): 1 g via ORAL
  Filled 2015-02-26 (×9): qty 10

## 2015-02-26 MED ORDER — LACTULOSE 10 GM/15ML PO SOLN: 20.0000 g | Freq: Every day | ORAL | Status: DC | PRN

## 2015-02-26 MED ORDER — SUCRALFATE 1 GM/10ML PO SUSP: 1.0000 g | Freq: Three times a day (TID) | ORAL | Status: DC

## 2015-02-26 MED ORDER — RANITIDINE HCL 150 MG/10ML PO SYRP: 150.0000 mg | ORAL_SOLUTION | Freq: Two times a day (BID) | ORAL | Status: DC

## 2015-02-26 MED ORDER — OXYCODONE HCL 5 MG PO TABS: 5.0000 mg | ORAL_TABLET | ORAL | Status: DC | PRN

## 2015-02-26 NOTE — Progress Notes (Signed)
Patient told RN that she does not have a car- nor does her BF in case she d/c'ed. She states that she continues to feel weak and is not really holding anything down. MD Verlon Au has been paged- awaiting call back.
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
## 2015-02-27 DIAGNOSIS — F1010 Alcohol abuse, uncomplicated: Secondary | ICD-10-CM

## 2015-02-27 DIAGNOSIS — K7290 Hepatic failure, unspecified without coma: Secondary | ICD-10-CM

## 2015-02-27 DIAGNOSIS — G9340 Encephalopathy, unspecified: Secondary | ICD-10-CM

## 2015-02-27 DIAGNOSIS — R569 Unspecified convulsions: Secondary | ICD-10-CM

## 2015-02-27 MED ORDER — LEVETIRACETAM 1000 MG PO TABS: 1000.0000 mg | ORAL_TABLET | Freq: Two times a day (BID) | ORAL | Status: DC
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
## 2015-02-27 NOTE — Clinical Social Work Note (Signed)
Clinical Social Work Assessment  Patient Details  Name: Kristen Roberson MRN: [id number] Date of Birth: [birth year]  Date of referral:  02/27/15               Reason for consult:  Discharge Planning                Permission sought to share information with:    Permission granted to share information::     Name::        Agency::     Relationship::     Contact Information:     Housing/Transportation Living arrangements for the past 2 months:  Apartment Source of Information:    Patient Interpreter Needed:  None Criminal Activity/Legal Involvement Pertinent to Current Situation/Hospitalization:  No - Comment as needed Significant Relationships:  Significant Other Lives with:  Self Do you feel safe going back to the place where you live?  Yes Need for family participation in patient care:  No (Coment)  Care giving concerns:  CSW received referral for current substance abuse and transportation needs.    Social Worker assessment / plan:  CSW received referral for current substance abuse and that pt needing assistance with transportation upon discharge.  CSW met with pt and pt significant other at bedside. CSW obtained permission from pt to speak with pt significant other present. Pt shared that she has been feeling weak and slowly being able to eat again. CSW discussed with pt concern about alcohol abuse. Pt reports that she has been to [facility] in the past, but did not follow up due to financial hardship. CSW discussed options as AA and [facility] [place]. Pt open to receiving information about outpatient substance abuse options. CSW provided support as pt discussed that she is motivated to stop drinking as she does not want to go through the pain she went through this hospitalization. Pt stated that she recognizes that in the past she has turned to drinking as a coping mechanism. CSW discussed that pt may benefit from outpatient counseling and explained that [facility] offers assistance with counseling as well. Pt expressed that she feels that may be helpful and pt significant other stated that it wouldn't be difficult for pt to get to [place] as it is close to the bus depot.   CSW provided pt with Outpatient Substance Abuse Resources and information about [facility] [place].   CSW discussed with pt that CSW had cab voucher approved from [place], [employer]. CSW confirmed address with pt.   CSW called for cab for transport and provided cab voucher to nurse tech whom will take pt to front entrance for pick up.   No further social work needs identified at this time.  CSW signing off.   Employment status:  Unemployed Forensic scientist:  Self Pay (Medicaid Pending) PT Recommendations:  No Follow Up Information / Referral to community resources:  [place], Outpatient Substance Abuse Treatment Options  Patient/Family's Response to care:  Pt alert and oriented x 4. Pt open to receiving resources for substance abuse. CSW and pt brainstormed options for outpatient resources that may not be as costly for pt as pt stated that cost is what made pt not follow up in the past. Pt states that she plans to make sure there is no alcohol in her home and is making an effort to not be around neighbors who she feels negatively impact  her.   Patient/Family's Understanding of and Emotional Response to Diagnosis, Current Treatment, and Prognosis:  Pt displayed knowledge about how alcohol use is negatively impacting pt health. Pt stated that she does not want to go through the pain she has been feeling in the hospital.   Emotional Assessment Appearance:  Appears stated age Attitude/Demeanor/Rapport:  Other (pt appropriate) Affect (typically observed):  Accepting Orientation:  Oriented to Self, Oriented to Place, Oriented to  Time, Oriented to Situation Alcohol / Substance use:  Alcohol  Use Psych involvement (Current and /or in the community):  No (Comment)  Discharge Needs  Concerns to be addressed:  Substance Abuse Concerns (transportation concerns) Readmission within the last 30 days:  No Current discharge risk:  None Barriers to Discharge:  No Barriers Identified   [place], [place], LCSW 02/27/2015, 10:46 AM  [phone]

## 2015-02-27 NOTE — Plan of Care (Signed)
Problem: Phase I Progression Outcomes Goal: OOB as tolerated unless otherwise ordered Outcome: Completed/Met Date Met:  02/27/15 Up as tol in room

## 2015-02-27 NOTE — Progress Notes (Signed)
Patient has orders to d/c- patient states that she has no way of getting home without some type of assistance. RN awaiting SW to speak with her for possible bus fair. Patient notified.

## 2015-02-27 NOTE — Discharge Instructions (Signed)
Follow with JEGEDE, OLUGBEMIGA, MD in 5-7 days  No driving or operating heavy machinery for 6  Months or until cleared by primary MD.  Please get a complete blood count and chemistry panel checked by your Primary MD at your next visit, and again as instructed by your Primary MD. Please get your medications reviewed and adjusted by your Primary MD.  Please request your Primary MD to go over all Hospital Tests and Procedure/Radiological results at the follow up, please get all Hospital records sent to your Prim MD by signing hospital release before you go home.  If you had Pneumonia of Lung problems at the Hospital: Please get a 2 view Chest X ray done in 6-8 weeks after hospital discharge or sooner if instructed by your Primary MD.  If you have Congestive Heart Failure: Please call your Cardiologist or Primary MD anytime you have any of the following symptoms:  1) 3 pound weight gain in 24 hours or 5 pounds in 1 week  2) shortness of breath, with or without a dry hacking cough  3) swelling in the hands, feet or stomach  4) if you have to sleep on extra pillows at night in order to breathe  Follow cardiac low salt diet and 1.5 lit/day fluid restriction.  If you have diabetes Accuchecks 4 times/day, Once in AM empty stomach and then before each meal. Log in all results and show them to your primary doctor at your next visit. If any glucose reading is under 80 or above 300 call your primary MD immediately.  If you have Seizure/Convulsions/Epilepsy: Please do not drive, operate heavy machinery, participate in activities at heights or participate in high speed sports until you have seen by Primary MD or a Neurologist and advised to do so again.  If you had Gastrointestinal Bleeding: Please ask your Primary MD to check a complete blood count within one week of discharge or at your next visit. Your endoscopic/colonoscopic biopsies that are pending at the time of discharge, will also need to  followed by your Primary MD.  Get Medicines reviewed and adjusted. Please take all your medications with you for your next visit with your Primary MD  Please request your Primary MD to go over all hospital tests and procedure/radiological results at the follow up, please ask your Primary MD to get all Hospital records sent to his/her office.  If you experience worsening of your admission symptoms, develop shortness of breath, life threatening emergency, suicidal or homicidal thoughts you must seek medical attention immediately by calling 911 or calling your MD immediately  if symptoms less severe.  You must read complete instructions/literature along with all the possible adverse reactions/side effects for all the Medicines you take and that have been prescribed to you. Take any new Medicines after you have completely understood and accpet all the possible adverse reactions/side effects.   Do not drive or operate heavy machinery when taking Pain medications.   Do not take more than prescribed Pain, Sleep and Anxiety Medications  Special Instructions: If you have smoked or chewed Tobacco  in the last 2 yrs please stop smoking, stop any regular Alcohol  and or any Recreational drug use.  Wear Seat belts while driving.  Please note You were cared for by a hospitalist during your hospital stay. If you have any questions about your discharge medications or the care you received while you were in the hospital after you are discharged, you can call the unit and asked to speak  with the hospitalist on call if the hospitalist that took care of you is not available. Once you are discharged, your primary care physician will handle any further medical issues. Please note that NO REFILLS for any discharge medications will be authorized once you are discharged, as it is imperative that you return to your primary care physician (or establish a relationship with a primary care physician if you do not have one)  for your aftercare needs so that they can reassess your need for medications and monitor your lab values.  You can reach the hospitalist office at phone [phone] or fax [phone]   If you do not have a primary care physician, you can call [phone] for a physician referral.  Activity: As tolerated with Full fall precautions use walker/cane & assistance as needed  Diet: regular  Disposition Home

## 2015-02-27 NOTE — Progress Notes (Signed)
Pt discharged to home with BF. Pt. Is alert and oriented. Pt is hemodynamically stable. AVS reviewed with pt. Capable of re verbalizing medication regimen. IV taken out. Discharge plan appropriate and in place.

## 2015-03-14 ENCOUNTER — Encounter (HOSPITAL_COMMUNITY): Payer: Self-pay

## 2015-03-14 ENCOUNTER — Emergency Department (HOSPITAL_COMMUNITY)
Admission: EM | Admit: 2015-03-14 | Discharge: 2015-03-15 | Discharge status: Against Medical Advice | Payer: Self-pay | Attending: Emergency Medicine | Admitting: Emergency Medicine

## 2015-03-14 DIAGNOSIS — F329 Major depressive disorder, single episode, unspecified: Secondary | ICD-10-CM | POA: Insufficient documentation

## 2015-03-14 DIAGNOSIS — T7421XA Adult sexual abuse, confirmed, initial encounter: Secondary | ICD-10-CM | POA: Insufficient documentation

## 2015-03-14 DIAGNOSIS — Z862 Personal history of diseases of the blood and blood-forming organs and certain disorders involving the immune mechanism: Secondary | ICD-10-CM | POA: Insufficient documentation

## 2015-03-14 DIAGNOSIS — Z8719 Personal history of other diseases of the digestive system: Secondary | ICD-10-CM | POA: Insufficient documentation

## 2015-03-14 DIAGNOSIS — G40909 Epilepsy, unspecified, not intractable, without status epilepticus: Secondary | ICD-10-CM | POA: Insufficient documentation

## 2015-03-14 DIAGNOSIS — Z765 Malingerer [conscious simulation]: Secondary | ICD-10-CM

## 2015-03-14 DIAGNOSIS — Z8742 Personal history of other diseases of the female genital tract: Secondary | ICD-10-CM | POA: Insufficient documentation

## 2015-03-14 DIAGNOSIS — F419 Anxiety disorder, unspecified: Secondary | ICD-10-CM | POA: Insufficient documentation

## 2015-03-14 DIAGNOSIS — Z79899 Other long term (current) drug therapy: Secondary | ICD-10-CM | POA: Insufficient documentation

## 2015-03-14 MED ORDER — HYDROCODONE-ACETAMINOPHEN 5-325 MG PO TABS
1.0000 | ORAL_TABLET | Freq: Once | ORAL | Status: AC
  Administered 2015-03-14: 1 via ORAL
  Filled 2015-03-14: qty 1

## 2015-03-14 NOTE — SANE Note (Signed)
Rec'd call back from [place] stating that pt has decided to leave and does not want SA consult or kit collection.
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
## 2015-03-14 NOTE — ED Notes (Signed)
Kristen [phone]
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
## 2015-03-16 ENCOUNTER — Encounter (HOSPITAL_COMMUNITY): Payer: Self-pay

## 2015-03-16 ENCOUNTER — Emergency Department (HOSPITAL_COMMUNITY)
Admission: EM | Admit: 2015-03-16 | Discharge: 2015-03-17 | Disposition: A | Discharge status: Home / Self Care | Payer: Self-pay | Attending: Emergency Medicine | Admitting: Emergency Medicine

## 2015-03-16 DIAGNOSIS — G40909 Epilepsy, unspecified, not intractable, without status epilepticus: Secondary | ICD-10-CM | POA: Insufficient documentation

## 2015-03-16 DIAGNOSIS — Z862 Personal history of diseases of the blood and blood-forming organs and certain disorders involving the immune mechanism: Secondary | ICD-10-CM | POA: Insufficient documentation

## 2015-03-16 DIAGNOSIS — F329 Major depressive disorder, single episode, unspecified: Secondary | ICD-10-CM | POA: Insufficient documentation

## 2015-03-16 DIAGNOSIS — R45851 Suicidal ideations: Secondary | ICD-10-CM

## 2015-03-16 DIAGNOSIS — F419 Anxiety disorder, unspecified: Secondary | ICD-10-CM | POA: Insufficient documentation

## 2015-03-16 DIAGNOSIS — Z79899 Other long term (current) drug therapy: Secondary | ICD-10-CM | POA: Insufficient documentation

## 2015-03-16 DIAGNOSIS — F1994 Other psychoactive substance use, unspecified with psychoactive substance-induced mood disorder: Secondary | ICD-10-CM | POA: Diagnosis present

## 2015-03-16 DIAGNOSIS — F1010 Alcohol abuse, uncomplicated: Secondary | ICD-10-CM

## 2015-03-16 DIAGNOSIS — Z8742 Personal history of other diseases of the female genital tract: Secondary | ICD-10-CM | POA: Insufficient documentation

## 2015-03-16 DIAGNOSIS — Z880 Allergy status to penicillin: Secondary | ICD-10-CM | POA: Insufficient documentation

## 2015-03-16 DIAGNOSIS — Z8719 Personal history of other diseases of the digestive system: Secondary | ICD-10-CM | POA: Insufficient documentation

## 2015-03-16 DIAGNOSIS — Z8674 Personal history of sudden cardiac arrest: Secondary | ICD-10-CM | POA: Insufficient documentation

## 2015-03-16 DIAGNOSIS — Z9889 Other specified postprocedural states: Secondary | ICD-10-CM | POA: Insufficient documentation

## 2015-03-16 DIAGNOSIS — F10230 Alcohol dependence with withdrawal, uncomplicated: Secondary | ICD-10-CM | POA: Insufficient documentation

## 2015-03-16 LAB — CBC
HCT: 33.2 % — ABNORMAL LOW (ref 36.0–46.0)
Hemoglobin: 10.6 g/dL — ABNORMAL LOW (ref 12.0–15.0)
MCH: 28.7 pg (ref 26.0–34.0)
MCHC: 31.9 g/dL (ref 30.0–36.0)
MCV: 90 fL (ref 78.0–100.0)
Platelets: 353 10*3/uL (ref 150–400)
RBC: 3.69 MIL/uL — ABNORMAL LOW (ref 3.87–5.11)
RDW: 21.8 % — AB (ref 11.5–15.5)
WBC: 3.3 10*3/uL — ABNORMAL LOW (ref 4.0–10.5)

## 2015-03-16 LAB — I-STAT BETA HCG BLOOD, ED (MC, WL, AP ONLY)

## 2015-03-16 LAB — RAPID URINE DRUG SCREEN, HOSP PERFORMED
Amphetamines: NOT DETECTED
Barbiturates: NOT DETECTED
Benzodiazepines: NOT DETECTED
Cocaine: NOT DETECTED
Opiates: NOT DETECTED
Tetrahydrocannabinol: NOT DETECTED

## 2015-03-16 LAB — COMPREHENSIVE METABOLIC PANEL
ALT: 28 U/L (ref 14–54)
AST: 85 U/L — AB (ref 15–41)
Albumin: 4.8 g/dL (ref 3.5–5.0)
Alkaline Phosphatase: 144 U/L — ABNORMAL HIGH (ref 38–126)
Anion gap: 10 (ref 5–15)
BILIRUBIN TOTAL: 0.5 mg/dL (ref 0.3–1.2)
BUN: 6 mg/dL (ref 6–20)
CO2: 20 mmol/L — ABNORMAL LOW (ref 22–32)
CREATININE: 0.47 mg/dL (ref 0.44–1.00)
Calcium: 8.8 mg/dL — ABNORMAL LOW (ref 8.9–10.3)
Chloride: 111 mmol/L (ref 101–111)
GFR calc Af Amer: >60 mL/min (ref >60)
GFR calc non Af Amer: >60 mL/min (ref >60)
Glucose, Bld: 117 mg/dL — ABNORMAL HIGH (ref 65–99)
POTASSIUM: 3.6 mmol/L (ref 3.5–5.1)
Sodium: 141 mmol/L (ref 135–145)
TOTAL PROTEIN: 8.3 g/dL — AB (ref 6.5–8.1)

## 2015-03-16 LAB — ETHANOL: Alcohol, Ethyl (B): 406 mg/dL (ref <5)

## 2015-03-16 LAB — SALICYLATE LEVEL

## 2015-03-16 LAB — ACETAMINOPHEN LEVEL: Acetaminophen (Tylenol), Serum: <10 ug/mL — ABNORMAL LOW (ref 10–30)

## 2015-03-16 MED ORDER — ZOLPIDEM TARTRATE 5 MG PO TABS
5.0000 mg | ORAL_TABLET | Freq: Once | ORAL | Status: DC
  Filled 2015-03-16: qty 1

## 2015-03-16 MED ORDER — LORAZEPAM 1 MG PO TABS: 0.0000 mg | ORAL_TABLET | Freq: Two times a day (BID) | ORAL | Status: DC

## 2015-03-16 MED ORDER — LORAZEPAM 1 MG PO TABS
0.0000 mg | ORAL_TABLET | Freq: Four times a day (QID) | ORAL | Status: DC
  Administered 2015-03-16 – 2015-03-17 (×2): 2 mg via ORAL
  Filled 2015-03-16 (×2): qty 2

## 2015-03-16 MED ORDER — ONDANSETRON HCL 4 MG PO TABS: 4.0000 mg | ORAL_TABLET | Freq: Three times a day (TID) | ORAL | Status: DC | PRN

## 2015-03-16 MED ORDER — ALUM & MAG HYDROXIDE-SIMETH 200-200-20 MG/5ML PO SUSP: 30.0000 mL | ORAL | Status: DC | PRN

## 2015-03-16 MED ORDER — IBUPROFEN 200 MG PO TABS
600.0000 mg | ORAL_TABLET | Freq: Three times a day (TID) | ORAL | Status: DC | PRN
  Administered 2015-03-17: 600 mg via ORAL
  Filled 2015-03-16: qty 3

## 2015-03-16 MED ORDER — NICOTINE 21 MG/24HR TD PT24: 21.0000 mg | MEDICATED_PATCH | Freq: Every day | TRANSDERMAL | Status: DC

## 2015-03-16 NOTE — ED Notes (Signed)
Patient is resting comfortably. 

## 2015-03-16 NOTE — ED Notes (Signed)
Patient and belongings wanded by security and placed in locker 26
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
## 2015-03-16 NOTE — BH Assessment (Signed)
Counselor spoke with officer Marcello Moores regarding patient complaints about the restraining order. Officer Marcello Moores indicates that it is "not in the system."  Rosalin Hawking, [place] 03/16/2015 5:12 PM'
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
## 2015-03-16 NOTE — ED Notes (Signed)
Bed: WA26 Expected date:  Expected time:  Means of arrival:  Comments: Triage 4 
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
## 2015-03-16 NOTE — BH Assessment (Addendum)
Counselor consulted with Loralyn Freshwater, DNP who recommends patient be observed overnight and evaluated by psychiatry in the morning. Informed Dr. Sabra Heck of disposition. Discussed with patient that she will be observed overnight and see psychiatry in the morning. Patient is agreeable at this time.   Rosalin Hawking, LCSW Therapeutic Triage Specialist [place] 03/16/2015 5:13 PM'

## 2015-03-17 DIAGNOSIS — F1994 Other psychoactive substance use, unspecified with psychoactive substance-induced mood disorder: Secondary | ICD-10-CM

## 2015-03-17 DIAGNOSIS — F10230 Alcohol dependence with withdrawal, uncomplicated: Secondary | ICD-10-CM
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
## 2015-03-17 NOTE — ED Notes (Addendum)
Patient is resting comfortably. Sitter at bedside.  

## 2015-03-17 NOTE — ED Notes (Signed)
First phone call from [place].

## 2015-03-17 NOTE — ED Notes (Addendum)
Scott Mortensen-,friend, would like patient to call him when she's allowed to use the [phone]
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
## 2015-03-17 NOTE — BHH Suicide Risk Assessment (Signed)
Suicide Risk Assessment  Discharge Assessment   [facility] Discharge Suicide Risk Assessment   Demographic Factors:  Caucasian  Total Time spent with patient: 45 minutes  Musculoskeletal: Strength & Muscle Tone: within normal limits Gait & Station: normal Patient leans: N/A  Psychiatric Specialty Exam: Physical Exam  Review of Systems  Constitutional: Negative.   HENT: Negative.   Eyes: Negative.   Respiratory: Negative.   Cardiovascular: Negative.   Gastrointestinal: Negative.   Genitourinary: Negative.   Musculoskeletal: Negative.   Skin: Negative.   Neurological: Negative.   Endo/Heme/Allergies: Negative.   Psychiatric/Behavioral: Positive for substance abuse.    Blood pressure 131/82, pulse 93, temperature 97.9 F (36.6 C), temperature source Oral, resp. rate 18, last menstrual period 02/21/2015, SpO2 100 %.There is no weight on file to calculate BMI.  General Appearance: Casual  Eye Contact::  Good  Speech:  Normal Rate  Volume:  Normal  Mood:  Anxious, mild  Affect:  Congruent  Thought Process:  Coherent  Orientation:  Full (Time, Place, and Person)  Thought Content:  WDL  Suicidal Thoughts:  No  Homicidal Thoughts:  No  Memory:  Immediate;   Good Recent;   Good Remote;   Good  Judgement:  Fair  Insight:  Lacking  Psychomotor Activity:  Normal  Concentration:  Good  Recall:  Good  Fund of Knowledge:Good  Language: Good  Akathisia:  No  Handed:  Right  AIMS (if indicated):     Assets:  Housing Intimacy Leisure Time Physical Health Resilience Social Support  ADL's:  Intact  Cognition: WNL  Sleep:         Has this patient used any form of tobacco in the last 30 days? (Cigarettes, Smokeless Tobacco, Cigars, and/or Pipes) No  Mental Status Per Nursing Assessment::   On Admission:   Alcohol intoxication  Current Mental Status by Physician: NA  Loss Factors: NA  Historical Factors: NA  Risk Reduction Factors:   Positive social support and  Positive therapeutic relationship  Continued Clinical Symptoms:  Anxiety, mild  Cognitive Features That Contribute To Risk:  None    Suicide Risk:  Minimal: No identifiable suicidal ideation.  Patients presenting with no risk factors but with morbid ruminations; may be classified as minimal risk based on the severity of the depressive symptoms  Principal Problem: Substance induced mood disorder Discharge Diagnoses:  Patient Active Problem List   Diagnosis Date Noted  . Alcohol dependence with withdrawal, uncomplicated [U93.235] 57/32/2025    Priority: High  . Substance induced mood disorder [F19.94] 03/26/2014    Priority: High  . Alcohol dependence [F10.20] 01/06/2014    Priority: High  . Anxiety [F41.9]     Priority: High  . Hepatic encephalopathy [K72.90] 02/23/2015  . Seizure [R56.9] 02/23/2015  . Pancytopenia [D61.818] 02/23/2015  . Internal hemorrhoids [K64.8] 02/23/2015  . Alcohol intoxication [F10.129]   . Hemorrhoid [K64.9]   . C. difficile colitis [A04.7]   . Alcohol abuse [F10.10]   . Elevated liver enzymes [R74.8]   . Colitis [K52.9] 12/12/2014  . Abrasion of wrist [S60.819A]   . Alcohol dependence with uncomplicated withdrawal [K27.062]   . GAD (generalized anxiety disorder) [F41.1] 07/01/2014  . Alcohol withdrawal syndrome without complication [B76.283] 15/17/6160  . MDD (major depressive disorder), recurrent episode, moderate [F33.1]   . Alcohol use disorder, severe, dependence [F10.20]   . Alcohol dependence with withdrawal with complication [V37.106] 26/94/8546  . Thrombocytopenia [D69.6] 05/07/2014  . Hypokalemia [E87.6] 05/07/2014  . Right sided weakness [M62.89] 04/15/2014  .  Seizures [R56.9] 04/04/2014  . Status post myocardial infarction [I25.2] 03/06/2014  . Depression [F32.9] 03/06/2014  . Substance abuse [F19.10] 02/22/2014  . Acute respiratory failure with hypoxia [J96.01] 02/22/2014  . Ventricular fibrillation [I49.01] 02/22/2014  . Acute  systolic heart failure - s/p VF Cardiac Arrest [I50.21] 02/22/2014  . Acute encephalopathy [G93.40] 02/22/2014  . Acute confusional state [F05] 02/22/2014  . Moderate malnutrition [E44.0] 02/21/2014  . Convulsions/seizures [R56.9] 02/14/2014  . Cardiac arrest [I46.9] 02/13/2014  . Severe alcohol use disorder [F10.99] 01/06/2014  . Adjustment disorder with depressed mood [F43.21] 12/14/2013  . 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 Folate deficiency [E53.8] 12/11/2013  . Alcoholism [F10.20] 12/11/2013  . Alcohol withdrawal [F10.239] 12/11/2013  . Hypokalemia [E87.6] 12/11/2013  . Malnutrition of moderate degree [E44.0] 12/10/2013  . Abdominal pain [R10.9] 11/25/2013  . Mallory-Weiss tear [K22.6] 11/25/2013  . Acute blood loss anemia [D62] 11/24/2013  . Hematemesis [K92.0] 11/23/2013  . Fatty liver [K76.0] 10/29/2013  . Internal hemorrhoids with other complication [B15.1] 76/16/0737  . Hematochezia [K92.1] 10/28/2013  . Normocytic anemia [D64.9] 10/28/2013  . GIB (gastrointestinal bleeding) [K92.2] 10/28/2013  . Post-operative state [Z98.89] 09/29/2013  . Postoperative state [Z98.89] 09/28/2013  . Female pelvic pain [R10.2] 09/21/2013  . Menorrhagia [N92.0] 09/21/2013  . History of ovarian cyst [Z87.42] 09/21/2013  . 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 Dysmenorrhea [N94.6] 09/21/2013      Plan Of Care/Follow-up recommendations:  Activity:  as tolerated Diet:  heart healthy diet  Is patient on multiple antipsychotic therapies at discharge:  No   Has Patient had three or more failed trials of antipsychotic monotherapy by history:  No  Recommended Plan for Multiple Antipsychotic Therapies: NA    Ruhi Kopke, PMH-NP 03/17/2015, 11:08 AM
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
## 2015-03-17 NOTE — ED Notes (Signed)
Patient is resting comfortably. Sitter at bedside.  

## 2015-04-21 ENCOUNTER — Encounter (HOSPITAL_COMMUNITY): Payer: Self-pay

## 2015-04-21 ENCOUNTER — Emergency Department (HOSPITAL_COMMUNITY)
Admission: EM | Admit: 2015-04-21 | Discharge: 2015-04-21 | Discharge status: Against Medical Advice | Payer: Self-pay | Attending: Emergency Medicine | Admitting: Emergency Medicine

## 2015-04-21 DIAGNOSIS — K644 Residual hemorrhoidal skin tags: Secondary | ICD-10-CM | POA: Insufficient documentation

## 2015-04-21 DIAGNOSIS — Z8719 Personal history of other diseases of the digestive system: Secondary | ICD-10-CM | POA: Insufficient documentation

## 2015-04-21 DIAGNOSIS — Z8659 Personal history of other mental and behavioral disorders: Secondary | ICD-10-CM | POA: Insufficient documentation

## 2015-04-21 DIAGNOSIS — Z8742 Personal history of other diseases of the female genital tract: Secondary | ICD-10-CM | POA: Insufficient documentation

## 2015-04-21 DIAGNOSIS — Z8674 Personal history of sudden cardiac arrest: Secondary | ICD-10-CM | POA: Insufficient documentation

## 2015-04-21 DIAGNOSIS — Z862 Personal history of diseases of the blood and blood-forming organs and certain disorders involving the immune mechanism: Secondary | ICD-10-CM | POA: Insufficient documentation

## 2015-04-21 DIAGNOSIS — R300 Dysuria: Secondary | ICD-10-CM | POA: Insufficient documentation

## 2015-04-21 DIAGNOSIS — Z79899 Other long term (current) drug therapy: Secondary | ICD-10-CM | POA: Insufficient documentation

## 2015-04-21 DIAGNOSIS — R109 Unspecified abdominal pain: Secondary | ICD-10-CM

## 2015-04-21 DIAGNOSIS — K921 Melena: Secondary | ICD-10-CM | POA: Insufficient documentation

## 2015-04-21 DIAGNOSIS — R1030 Lower abdominal pain, unspecified: Secondary | ICD-10-CM | POA: Insufficient documentation

## 2015-04-21 DIAGNOSIS — G40909 Epilepsy, unspecified, not intractable, without status epilepticus: Secondary | ICD-10-CM | POA: Insufficient documentation

## 2015-04-21 DIAGNOSIS — R112 Nausea with vomiting, unspecified: Secondary | ICD-10-CM | POA: Insufficient documentation

## 2015-04-21 DIAGNOSIS — Z880 Allergy status to penicillin: Secondary | ICD-10-CM | POA: Insufficient documentation

## 2015-04-21 LAB — COMPREHENSIVE METABOLIC PANEL
ALBUMIN: 4.7 g/dL (ref 3.5–5.0)
ALT: 22 U/L (ref 14–54)
ANION GAP: 12 (ref 5–15)
AST: 62 U/L — ABNORMAL HIGH (ref 15–41)
Alkaline Phosphatase: 103 U/L (ref 38–126)
BILIRUBIN TOTAL: 0.7 mg/dL (ref 0.3–1.2)
BUN: 6 mg/dL (ref 6–20)
CO2: 25 mmol/L (ref 22–32)
Calcium: 8.7 mg/dL — ABNORMAL LOW (ref 8.9–10.3)
Chloride: 105 mmol/L (ref 101–111)
Creatinine, Ser: 0.44 mg/dL (ref 0.44–1.00)
GFR calc non Af Amer: >60 mL/min (ref >60)
GLUCOSE: 108 mg/dL — AB (ref 65–99)
POTASSIUM: 3.5 mmol/L (ref 3.5–5.1)
SODIUM: 142 mmol/L (ref 135–145)
TOTAL PROTEIN: 7.4 g/dL (ref 6.5–8.1)

## 2015-04-21 LAB — LIPASE, BLOOD: Lipase: 26 U/L (ref 22–51)

## 2015-04-21 LAB — CBC
HEMATOCRIT: 29.1 % — AB (ref 36.0–46.0)
HEMOGLOBIN: 9.3 g/dL — AB (ref 12.0–15.0)
MCH: 28.7 pg (ref 26.0–34.0)
MCHC: 32 g/dL (ref 30.0–36.0)
MCV: 89.8 fL (ref 78.0–100.0)
Platelets: 179 10*3/uL (ref 150–400)
RBC: 3.24 MIL/uL — AB (ref 3.87–5.11)
RDW: 22 % — ABNORMAL HIGH (ref 11.5–15.5)
WBC: 2.7 10*3/uL — ABNORMAL LOW (ref 4.0–10.5)

## 2015-04-21 MED ORDER — ONDANSETRON HCL 4 MG/2ML IJ SOLN
4.0000 mg | Freq: Once | INTRAMUSCULAR | Status: DC | PRN
  Filled 2015-04-21: qty 2

## 2015-04-21 MED ORDER — KETOROLAC TROMETHAMINE 60 MG/2ML IM SOLN
60.0000 mg | Freq: Once | INTRAMUSCULAR | Status: DC
  Filled 2015-04-21: qty 2

## 2015-04-21 NOTE — ED Notes (Signed)
Nurse Danae Chen) and I went in room to preform in and out cath and patient was angry and did not want me in room because I asked if she had gotten urine out of toilet. Patient then demanded I leave and told the nurse she was leaving [place]

## 2015-04-21 NOTE — ED Provider Notes (Signed)
CSN: [id number]     Arrival date & time 04/21/15  1536 History   First MD Initiated Contact with Patient 04/21/15 1554     Chief Complaint  Patient presents with  . Abdominal Pain    Abd pain x 4 days.    HPI  Kristen Roberson is a 39 year old female presenting with lower abdominal pain x 4 days. Pt reports that she has "hot knife stabbing pain" in her lower abdomen that is intermittent. She has tried taking advil for the pain without relief. She says the pain is so severe that it causes her to get nauseous and vomit. Pt has chronic abdominal pain and visits the ED frequently. Pt is also complaining of hemorrhoids that have been bleeding for 3 months. She states that her GI doctor wants to do surgery to remove them but she cannot afford this. Denies current rectal pain. She also states that she had two seizures earlier today but that this is a common occurrence for her. Denies fevers, chills, chest pain, SOB, headaches or dizziness. She endorses dysuria over the past few days but denies hematuria or vaginal discharge.   Past Medical History  Diagnosis Date  . Proctitis   . Cysts of both ovaries   . Anemia   . Anxiety   . Blood transfusion without reported diagnosis   . Depression   . Fatty liver 10/05/13  . Cardiac arrest ([place])   . Seizures ([place])   . Alcohol abuse    Past Surgical History  Procedure Laterality Date  . Ovarian cyst removal    . Laparoscopy N/A 09/28/2013    Procedure: LAPAROSCOPY OPERATIVE;  Surgeon: Terrance Mass, MD;  Location: [facility];  Service: Gynecology;  Laterality: N/A;  . Laparoscopic appendectomy Right 09/28/2013    Procedure: APPENDECTOMY LAPAROSCOPIC;  Surgeon: Terrance Mass, MD;  Location: [facility];  Service: Gynecology;  Laterality: Right;  . Salpingoophorectomy Right 09/28/2013    Procedure: SALPINGO OOPHORECTOMY;  Surgeon: Terrance Mass, MD;  Location: [facility];  Service: Gynecology;  Laterality: Right;  . Colonoscopy N/A 09/30/2013    Procedure: COLONOSCOPY;   Surgeon: Lafayette Dragon, MD;  Location: [facility];  Service: Endoscopy;  Laterality: N/A;  . Esophagogastroduodenoscopy N/A 11/23/2013    Procedure: ESOPHAGOGASTRODUODENOSCOPY (EGD);  Surgeon: Jerene Bears, MD;  Location: [facility];  Service: Endoscopy;  Laterality: N/A;  . Appendectomy    . Left and right heart catheterization with coronary angiogram N/A 02/23/2014    Procedure: LEFT AND RIGHT HEART CATHETERIZATION WITH CORONARY ANGIOGRAM;  Surgeon: Leonie Man, MD;  Location: [facility] CATH LAB;  Service: Cardiovascular;  Laterality: N/A;   Family History  Problem Relation Age of Onset  . Diabetes Mother   . Hyperlipidemia Mother   . Stroke Mother   . Diabetes Father    Social History  Substance Use Topics  . Smoking status: Never Smoker   . Smokeless tobacco: Never Used  . Alcohol Use: Yes     Comment: occaisonal   OB History    Gravida Para Term Preterm AB TAB SAB Ectopic Multiple Living   7 3   4  4   3      Review of Systems  Constitutional: Negative for fever and chills.  Respiratory: Negative for shortness of breath.   Cardiovascular: Negative for chest pain.  Gastrointestinal: Positive for nausea, vomiting, abdominal pain, blood in stool and anal bleeding. Negative for diarrhea, constipation and rectal pain.  Genitourinary: Positive for dysuria. Negative  for hematuria and vaginal discharge.  Neurological: Positive for seizures. Negative for dizziness, light-headedness and headaches.      Allergies  Morphine and related; Tramadol; and Penicillins  Home Medications   Prior to Admission medications   Medication Sig Start Date End Date Taking? Authorizing Provider  carvedilol (COREG) 3.125 MG tablet Take 0.5 tablets (1.5625 mg total) by mouth 2 (two) times daily with a meal. Patient taking differently: Take 1.5625 mg by mouth daily.  02/26/15  Yes Nita Sells, MD  Ibuprofen 200 MG CAPS Take 400 mg by mouth every 6 (six) hours as needed (PAIN).   Yes Historical  Provider, MD  levETIRAcetam (KEPPRA) 1000 MG tablet Take 1 tablet (1,000 mg total) by mouth 2 (two) times daily. For seizure activities 02/27/15  Yes Costin Karlyne Greenspan, MD  calcium-vitamin D (OSCAL WITH D) 500-200 MG-UNIT per tablet Take 1 tablet by mouth daily with breakfast. Patient not taking: Reported on 01/07/2015 12/15/14   Robbie Lis, MD  dicyclomine (BENTYL) 20 MG tablet Take 1 tablet (20 mg total) by mouth 2 (two) times daily. Patient not taking: Reported on 01/07/2015 12/09/14   Waynetta Pean, PA-C  hydrocortisone-pramoxine (PROCTOFOAM [facility]) rectal foam Place 1 applicator rectally 2 (two) times daily. Patient not taking: Reported on 01/07/2015 12/09/14   Waynetta Pean, PA-C  lactulose (CHRONULAC) 10 GM/15ML solution Take 30 mLs (20 g total) by mouth daily as needed for mild constipation. Patient not taking: Reported on 03/14/2015 02/26/15   Nita Sells, MD  mirtazapine (REMERON) 30 MG tablet Take 1 tablet (30 mg total) by mouth at bedtime. For depression/sleep Patient not taking: Reported on 01/07/2015 07/04/14   Encarnacion Slates, NP  ondansetron (ZOFRAN ODT) 4 MG disintegrating tablet Take 1 tablet (4 mg total) by mouth every 8 (eight) hours as needed for nausea or vomiting. Patient not taking: Reported on 01/07/2015 12/09/14   Waynetta Pean, PA-C  oxyCODONE (OXY IR/ROXICODONE) 5 MG immediate release tablet Take 1 tablet (5 mg total) by mouth every 4 (four) hours as needed for moderate pain. Patient not taking: Reported on 03/14/2015 02/26/15   Nita Sells, MD  potassium chloride 20 MEQ TBCR Take 20 mEq by mouth daily. Patient not taking: Reported on 01/07/2015 12/15/14   Robbie Lis, MD  ranitidine (ZANTAC) 150 MG/10ML syrup Take 10 mLs (150 mg total) by mouth 2 (two) times daily. Patient not taking: Reported on 03/14/2015 02/26/15   Nita Sells, MD  saccharomyces boulardii (FLORASTOR) 250 MG capsule Take 1 capsule (250 mg total) by mouth 2 (two) times daily. Patient not taking:  Reported on 01/07/2015 12/15/14   Robbie Lis, MD  sucralfate (CARAFATE) 1 GM/10ML suspension Take 10 mLs (1 g total) by mouth 4 (four) times daily -  with meals and at bedtime. Patient not taking: Reported on 03/14/2015 02/26/15   Nita Sells, MD   BP 121/82 mmHg  Pulse 92  Temp(Src) 97.9 F (36.6 C) (Oral)  SpO2 96% Physical Exam  Constitutional: She appears well-developed and well-nourished. No distress.  HENT:  Head: Normocephalic and atraumatic.  Eyes: Conjunctivae are normal. Right eye exhibits no discharge. Left eye exhibits no discharge. No scleral icterus.  Neck: Normal range of motion.  Cardiovascular: Normal rate, regular rhythm and normal heart sounds.   Pulmonary/Chest: Effort normal. No respiratory distress. She has no wheezes. She has no rales.  Abdominal: Soft. Bowel sounds are normal. She exhibits no distension. There is tenderness. There is no rebound and no guarding.  Lower abdominal and  suprapubic TTP.  Genitourinary:  Multiple non-thrombosed external hemorrhoids noted. No rectal bleeding at this time  Musculoskeletal: Normal range of motion.  Pt moves all extremities spontaneously and walks with a steady gait.  Neurological: She is alert. Coordination normal.  Skin: Skin is warm and dry.  Psychiatric: She has a normal mood and affect. Her behavior is normal.  Nursing note and vitals reviewed.   ED Course  Procedures (including critical care time) Labs Review Labs Reviewed  COMPREHENSIVE METABOLIC PANEL - Abnormal; Notable for the following:    Glucose, Bld 108 (*)    Calcium 8.7 (*)    AST 62 (*)    All other components within normal limits  CBC - Abnormal; Notable for the following:    WBC 2.7 (*)    RBC 3.24 (*)    Hemoglobin 9.3 (*)    HCT 29.1 (*)    RDW 22.0 (*)    All other components within normal limits  WET PREP, GENITAL  LIPASE, BLOOD  GC/CHLAMYDIA PROBE AMP ([place]) NOT AT [facility]    Imaging Review No results found. I have  personally reviewed and evaluated these images and lab results as part of my medical decision-making.   EKG Interpretation None      MDM   Final diagnoses:  Abdominal pain, unspecified abdominal location   Pt presenting with lower abdominal pain and hemorrhoids as noted above. Discussed with pt that we would do a pelvic exam for possible source of pain but pt left AMA before this was performed. Pt was upset that she was given toradol as pain reliever. Pt also appears to have provided improperly collected urine sample. Pt left the emergency department before I could discuss leaving AMA with her.    Josephina Gip, PA-C 04/22/15 1158  Evelina Bucy, MD 04/22/15 [phone]

## 2015-04-21 NOTE — ED Notes (Signed)
Bed: JQ73 Expected date:  Expected time:  Means of arrival:  Comments: Abd pain

## 2015-04-21 NOTE — ED Notes (Signed)
Pt transported by [facility] from home.  Pt c/o abdominal pain, nausea, vomiting.  Pt states she has bright red blood in stool.  Pt reported to EMS that she has had two seizures today.

## 2015-04-21 NOTE — ED Notes (Signed)
Asked patient to provide a urine specimen. Patient agreed but requested that friend go to restroom with her. I told patient that it was right down the hall and I would be right outside the door. Patient requested her friend to please go to restroom with her. I made her aware that we did not allow that and if she needed help i would be there to help. Patient agreed and went to bathroom. I heard the sink turn on twice before patient flushed the toilet. Patient handed me the cup of urine and urine was cold and had a clear bubbly soapy appearance. I asked patient if she peed in the cup or got it out of the toilet and patient laughed and said "do people really do that? They would catch that in the lab." Patient went back in room laughing that people sometimes get urine from toilet and told friend who was also laughing. I then showed urine to the nurse and doctor Mingo Amber, who requested an In&out Cath to assure a clean specimen

## 2015-04-21 NOTE — ED Notes (Addendum)
Pt refuses all ordered medication.  Pt stated that Toradol makes her nauseated.  Informed patient that zofran was ordered to prevent nausea - pt then stated that Toradol makes her "violently ill".  Pt states she will go to another hospital.    Pt states she has been treated "poorly" and is leaving.  Pt alert, oriented, walking with steady gait.

## 2015-05-20 ENCOUNTER — Telehealth: Payer: Self-pay | Admitting: Internal Medicine

## 2015-05-20 NOTE — Telephone Encounter (Signed)
Pt. Called stating that she needs a letter from PCP stating that she has colitis and seizures, and bleeds a lot. Pt. Needs the letter so that she can be able to  Get her food stamps. Please f/u with pt.

## 2015-05-22 NOTE — Telephone Encounter (Signed)
Medical Assistant unable to leave a voicemail due to mailbox being full.   Please inform patient of needing to have an appointment and be seen prior to any lettering being generated. Patient has not been seen in the office since 03/06/14.

## 2015-05-23 ENCOUNTER — Telehealth: Payer: Self-pay | Admitting: Internal Medicine

## 2015-05-23 NOTE — Telephone Encounter (Signed)
Patient called in regards to the letter that she requested, patient was told that she needed an appointment to see the doctor before the letter could be written. Patient then was offered an appointment and refused the appointment stating that the appointment was too far out.  Patient then became upset and hung up the phone.

## 2015-07-15 ENCOUNTER — Emergency Department (HOSPITAL_COMMUNITY)
Admission: EM | Admit: 2015-07-15 | Discharge: 2015-07-16 | Disposition: A | Discharge status: Other Acute Inpatient Hospital | Payer: Self-pay | Attending: Emergency Medicine | Admitting: Emergency Medicine

## 2015-07-15 ENCOUNTER — Encounter (HOSPITAL_COMMUNITY): Payer: Self-pay | Admitting: Emergency Medicine

## 2015-07-15 DIAGNOSIS — Z3202 Encounter for pregnancy test, result negative: Secondary | ICD-10-CM | POA: Insufficient documentation

## 2015-07-15 DIAGNOSIS — S61511A Laceration without foreign body of right wrist, initial encounter: Secondary | ICD-10-CM | POA: Insufficient documentation

## 2015-07-15 DIAGNOSIS — F10120 Alcohol abuse with intoxication, uncomplicated: Secondary | ICD-10-CM | POA: Insufficient documentation

## 2015-07-15 DIAGNOSIS — F329 Major depressive disorder, single episode, unspecified: Secondary | ICD-10-CM | POA: Insufficient documentation

## 2015-07-15 DIAGNOSIS — Y998 Other external cause status: Secondary | ICD-10-CM | POA: Insufficient documentation

## 2015-07-15 DIAGNOSIS — Y9289 Other specified places as the place of occurrence of the external cause: Secondary | ICD-10-CM | POA: Insufficient documentation

## 2015-07-15 DIAGNOSIS — Y9389 Activity, other specified: Secondary | ICD-10-CM | POA: Insufficient documentation

## 2015-07-15 DIAGNOSIS — Z8674 Personal history of sudden cardiac arrest: Secondary | ICD-10-CM | POA: Insufficient documentation

## 2015-07-15 DIAGNOSIS — X788XXA Intentional self-harm by other sharp object, initial encounter: Secondary | ICD-10-CM | POA: Insufficient documentation

## 2015-07-15 DIAGNOSIS — Z8719 Personal history of other diseases of the digestive system: Secondary | ICD-10-CM | POA: Insufficient documentation

## 2015-07-15 DIAGNOSIS — Z8742 Personal history of other diseases of the female genital tract: Secondary | ICD-10-CM | POA: Insufficient documentation

## 2015-07-15 DIAGNOSIS — Z862 Personal history of diseases of the blood and blood-forming organs and certain disorders involving the immune mechanism: Secondary | ICD-10-CM | POA: Insufficient documentation

## 2015-07-15 DIAGNOSIS — R45851 Suicidal ideations: Secondary | ICD-10-CM | POA: Insufficient documentation

## 2015-07-15 DIAGNOSIS — Z87898 Personal history of other specified conditions: Secondary | ICD-10-CM | POA: Insufficient documentation

## 2015-07-15 DIAGNOSIS — Z79899 Other long term (current) drug therapy: Secondary | ICD-10-CM | POA: Insufficient documentation

## 2015-07-15 DIAGNOSIS — F10920 Alcohol use, unspecified with intoxication, uncomplicated: Secondary | ICD-10-CM

## 2015-07-15 DIAGNOSIS — Z880 Allergy status to penicillin: Secondary | ICD-10-CM | POA: Insufficient documentation

## 2015-07-15 DIAGNOSIS — F419 Anxiety disorder, unspecified: Secondary | ICD-10-CM | POA: Insufficient documentation

## 2015-07-15 LAB — CBC
HCT: 29.2 % — ABNORMAL LOW (ref 36.0–46.0)
Hemoglobin: 9.2 g/dL — ABNORMAL LOW (ref 12.0–15.0)
MCH: 29.4 pg (ref 26.0–34.0)
MCHC: 31.5 g/dL (ref 30.0–36.0)
MCV: 93.3 fL (ref 78.0–100.0)
Platelets: 284 10*3/uL (ref 150–400)
RBC: 3.13 MIL/uL — ABNORMAL LOW (ref 3.87–5.11)
RDW: 21.8 % — AB (ref 11.5–15.5)
WBC: 4.7 10*3/uL (ref 4.0–10.5)
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
## 2015-07-16 ENCOUNTER — Encounter (HOSPITAL_COMMUNITY): Payer: Self-pay

## 2015-07-16 ENCOUNTER — Inpatient Hospital Stay (HOSPITAL_COMMUNITY)
Admission: EM | Admit: 2015-07-16 | Discharge: 2015-07-20 | DRG: 897 | Disposition: A | Discharge status: Home / Self Care | Payer: Federal, State, Local not specified - Other | Source: Intra-hospital | Attending: Psychiatry | Admitting: Psychiatry

## 2015-07-16 DIAGNOSIS — Z23 Encounter for immunization: Secondary | ICD-10-CM | POA: Diagnosis not present

## 2015-07-16 DIAGNOSIS — F10230 Alcohol dependence with withdrawal, uncomplicated: Secondary | ICD-10-CM | POA: Diagnosis not present

## 2015-07-16 DIAGNOSIS — G40909 Epilepsy, unspecified, not intractable, without status epilepticus: Secondary | ICD-10-CM | POA: Diagnosis present

## 2015-07-16 DIAGNOSIS — F4310 Post-traumatic stress disorder, unspecified: Secondary | ICD-10-CM | POA: Diagnosis present

## 2015-07-16 DIAGNOSIS — Z823 Family history of stroke: Secondary | ICD-10-CM | POA: Diagnosis not present

## 2015-07-16 DIAGNOSIS — F1020 Alcohol dependence, uncomplicated: Secondary | ICD-10-CM | POA: Diagnosis present

## 2015-07-16 DIAGNOSIS — F329 Major depressive disorder, single episode, unspecified: Secondary | ICD-10-CM | POA: Diagnosis present

## 2015-07-16 DIAGNOSIS — G4700 Insomnia, unspecified: Secondary | ICD-10-CM | POA: Diagnosis present

## 2015-07-16 DIAGNOSIS — K760 Fatty (change of) liver, not elsewhere classified: Secondary | ICD-10-CM | POA: Diagnosis present

## 2015-07-16 DIAGNOSIS — F411 Generalized anxiety disorder: Secondary | ICD-10-CM | POA: Diagnosis present

## 2015-07-16 DIAGNOSIS — Z818 Family history of other mental and behavioral disorders: Secondary | ICD-10-CM

## 2015-07-16 DIAGNOSIS — Z833 Family history of diabetes mellitus: Secondary | ICD-10-CM

## 2015-07-16 DIAGNOSIS — I252 Old myocardial infarction: Secondary | ICD-10-CM | POA: Diagnosis not present

## 2015-07-16 LAB — ACETAMINOPHEN LEVEL

## 2015-07-16 LAB — COMPREHENSIVE METABOLIC PANEL
ALBUMIN: 4.5 g/dL (ref 3.5–5.0)
ALT: 80 U/L — ABNORMAL HIGH (ref 14–54)
ANION GAP: 11 (ref 5–15)
AST: 216 U/L — ABNORMAL HIGH (ref 15–41)
Alkaline Phosphatase: 166 U/L — ABNORMAL HIGH (ref 38–126)
BUN: 9 mg/dL (ref 6–20)
CHLORIDE: 108 mmol/L (ref 101–111)
CO2: 22 mmol/L (ref 22–32)
Calcium: 8.7 mg/dL — ABNORMAL LOW (ref 8.9–10.3)
Creatinine, Ser: 0.55 mg/dL (ref 0.44–1.00)
GFR calc non Af Amer: >60 mL/min (ref >60)
Glucose, Bld: 107 mg/dL — ABNORMAL HIGH (ref 65–99)
Potassium: 3.7 mmol/L (ref 3.5–5.1)
SODIUM: 141 mmol/L (ref 135–145)
Total Bilirubin: 0.5 mg/dL (ref 0.3–1.2)
Total Protein: 8.1 g/dL (ref 6.5–8.1)

## 2015-07-16 LAB — RAPID URINE DRUG SCREEN, HOSP PERFORMED
Amphetamines: NOT DETECTED
BENZODIAZEPINES: NOT DETECTED
Barbiturates: NOT DETECTED
Cocaine: NOT DETECTED
OPIATES: NOT DETECTED
Tetrahydrocannabinol: NOT DETECTED

## 2015-07-16 LAB — HCG, QUANTITATIVE, PREGNANCY: hCG, Beta Chain, Quant, S: <1 m[IU]/mL (ref <5)

## 2015-07-16 LAB — SALICYLATE LEVEL

## 2015-07-16 LAB — ETHANOL: Alcohol, Ethyl (B): 306 mg/dL (ref <5)

## 2015-07-16 MED ORDER — ALUM & MAG HYDROXIDE-SIMETH 200-200-20 MG/5ML PO SUSP: 30.0000 mL | ORAL | Status: DC | PRN

## 2015-07-16 MED ORDER — LORAZEPAM 1 MG PO TABS: 0.0000 mg | ORAL_TABLET | Freq: Two times a day (BID) | ORAL | Status: DC

## 2015-07-16 MED ORDER — LORAZEPAM 1 MG PO TABS
0.0000 mg | ORAL_TABLET | Freq: Four times a day (QID) | ORAL | Status: DC
  Administered 2015-07-16 (×2): 1 mg via ORAL
  Filled 2015-07-16 (×2): qty 1

## 2015-07-16 MED ORDER — ONDANSETRON HCL 4 MG PO TABS: 4.0000 mg | ORAL_TABLET | Freq: Three times a day (TID) | ORAL | Status: DC | PRN

## 2015-07-16 MED ORDER — CARVEDILOL 3.125 MG PO TABS
1.5625 mg | ORAL_TABLET | Freq: Every day | ORAL | Status: DC
  Administered 2015-07-16 – 2015-07-20 (×5): 1.5625 mg via ORAL
  Filled 2015-07-16: qty 7
  Filled 2015-07-16 (×5): qty 0.5
  Filled 2015-07-16: qty 1
  Filled 2015-07-16: qty 0.5
  Filled 2015-07-16: qty 1

## 2015-07-16 MED ORDER — ONDANSETRON 4 MG PO TBDP: 4.0000 mg | ORAL_TABLET | Freq: Four times a day (QID) | ORAL | Status: AC | PRN

## 2015-07-16 MED ORDER — LIDOCAINE-EPINEPHRINE 2 %-1:100000 IJ SOLN
10.0000 mL | Freq: Once | INTRAMUSCULAR | Status: AC
  Administered 2015-07-16: 10 mL via INTRADERMAL
  Filled 2015-07-16: qty 1

## 2015-07-16 MED ORDER — MAGNESIUM HYDROXIDE 400 MG/5ML PO SUSP: 30.0000 mL | Freq: Every day | ORAL | Status: DC | PRN

## 2015-07-16 MED ORDER — LEVETIRACETAM 500 MG PO TABS
1000.0000 mg | ORAL_TABLET | Freq: Two times a day (BID) | ORAL | Status: DC
  Administered 2015-07-16 – 2015-07-20 (×8): 1000 mg via ORAL
  Filled 2015-07-16: qty 28
  Filled 2015-07-16 (×7): qty 2
  Filled 2015-07-16: qty 28
  Filled 2015-07-16 (×5): qty 2

## 2015-07-16 MED ORDER — HYDROXYZINE HCL 25 MG PO TABS
25.0000 mg | ORAL_TABLET | Freq: Four times a day (QID) | ORAL | Status: AC | PRN
  Administered 2015-07-17 – 2015-07-18 (×2): 25 mg via ORAL
  Filled 2015-07-16 (×2): qty 1

## 2015-07-16 MED ORDER — IBUPROFEN 200 MG PO TABS
600.0000 mg | ORAL_TABLET | Freq: Three times a day (TID) | ORAL | Status: DC | PRN
  Administered 2015-07-16: 600 mg via ORAL
  Filled 2015-07-16 (×2): qty 3

## 2015-07-16 MED ORDER — LEVETIRACETAM 500 MG PO TABS
1000.0000 mg | ORAL_TABLET | Freq: Two times a day (BID) | ORAL | Status: DC
  Administered 2015-07-16: 1000 mg via ORAL
  Filled 2015-07-16 (×3): qty 2

## 2015-07-16 MED ORDER — THIAMINE HCL 100 MG/ML IJ SOLN
100.0000 mg | Freq: Once | INTRAMUSCULAR | Status: DC
  Filled 2015-07-16: qty 2

## 2015-07-16 MED ORDER — THIAMINE HCL 100 MG/ML IJ SOLN: 100.0000 mg | Freq: Every day | INTRAMUSCULAR | Status: DC

## 2015-07-16 MED ORDER — ADULT MULTIVITAMIN W/MINERALS CH
1.0000 | ORAL_TABLET | Freq: Every day | ORAL | Status: DC
  Administered 2015-07-16 – 2015-07-20 (×5): 1 via ORAL
  Filled 2015-07-16 (×8): qty 1

## 2015-07-16 MED ORDER — LORAZEPAM 1 MG PO TABS
1.0000 mg | ORAL_TABLET | Freq: Two times a day (BID) | ORAL | Status: AC
  Administered 2015-07-18 (×2): 1 mg via ORAL
  Filled 2015-07-16 (×2): qty 1

## 2015-07-16 MED ORDER — VITAMIN B-1 100 MG PO TABS: 100.0000 mg | ORAL_TABLET | Freq: Every day | ORAL | Status: DC

## 2015-07-16 MED ORDER — LORAZEPAM 1 MG PO TABS
1.0000 mg | ORAL_TABLET | Freq: Four times a day (QID) | ORAL | Status: AC
  Administered 2015-07-16 (×2): 1 mg via ORAL
  Filled 2015-07-16 (×2): qty 1

## 2015-07-16 MED ORDER — LORAZEPAM 1 MG PO TABS
1.0000 mg | ORAL_TABLET | Freq: Once | ORAL | Status: AC
  Administered 2015-07-19: 1 mg via ORAL
  Filled 2015-07-16: qty 1

## 2015-07-16 MED ORDER — CARVEDILOL 3.125 MG PO TABS
1.5625 mg | ORAL_TABLET | Freq: Every day | ORAL | Status: DC
  Administered 2015-07-16: 09:00:00 via ORAL
  Filled 2015-07-16: qty 0.5

## 2015-07-16 MED ORDER — LORAZEPAM 1 MG PO TABS
1.0000 mg | ORAL_TABLET | Freq: Every day | ORAL | Status: AC
  Administered 2015-07-19: 1 mg via ORAL
  Filled 2015-07-16: qty 1

## 2015-07-16 MED ORDER — IBUPROFEN 600 MG PO TABS: 600.0000 mg | ORAL_TABLET | Freq: Three times a day (TID) | ORAL | Status: DC | PRN

## 2015-07-16 MED ORDER — LOPERAMIDE HCL 2 MG PO CAPS: 2.0000 mg | ORAL_CAPSULE | ORAL | Status: AC | PRN

## 2015-07-16 MED ORDER — LORAZEPAM 1 MG PO TABS
1.0000 mg | ORAL_TABLET | Freq: Three times a day (TID) | ORAL | Status: AC
Start: 2015-07-17 — End: 2015-07-17
  Administered 2015-07-17 (×3): 1 mg via ORAL
  Filled 2015-07-16 (×3): qty 1

## 2015-07-16 MED ORDER — ACETAMINOPHEN 325 MG PO TABS: 650.0000 mg | ORAL_TABLET | ORAL | Status: DC | PRN

## 2015-07-16 MED ORDER — LORAZEPAM 1 MG PO TABS: 0.0000 mg | ORAL_TABLET | Freq: Four times a day (QID) | ORAL | Status: DC

## 2015-07-16 MED ORDER — LORAZEPAM 1 MG PO TABS: 1.0000 mg | ORAL_TABLET | Freq: Four times a day (QID) | ORAL | Status: AC | PRN

## 2015-07-16 MED ORDER — INFLUENZA VAC SPLIT QUAD 0.5 ML IM SUSY
0.5000 mL | PREFILLED_SYRINGE | INTRAMUSCULAR | Status: AC
  Administered 2015-07-17: 0.5 mL via INTRAMUSCULAR
  Filled 2015-07-16: qty 0.5

## 2015-07-16 MED ORDER — VITAMIN B-1 100 MG PO TABS
100.0000 mg | ORAL_TABLET | Freq: Every day | ORAL | Status: DC
  Administered 2015-07-17 – 2015-07-20 (×4): 100 mg via ORAL
  Filled 2015-07-16 (×6): qty 1

## 2015-07-16 MED ORDER — VITAMIN B-1 100 MG PO TABS
100.0000 mg | ORAL_TABLET | Freq: Every day | ORAL | Status: DC
  Administered 2015-07-16: 100 mg via ORAL
  Filled 2015-07-16: qty 1

## 2015-07-16 MED ORDER — ACETAMINOPHEN 325 MG PO TABS
650.0000 mg | ORAL_TABLET | ORAL | Status: DC | PRN
  Administered 2015-07-16 – 2015-07-19 (×2): 650 mg via ORAL
  Filled 2015-07-16 (×2): qty 2

## 2015-07-16 MED ORDER — ACETAMINOPHEN 325 MG PO TABS: 650.0000 mg | ORAL_TABLET | Freq: Four times a day (QID) | ORAL | Status: DC | PRN

## 2015-07-16 NOTE — Progress Notes (Signed)
Patient ID: Kristen Roberson, female   DOB: [birth year], 39 y.o.   MRN: [id number] D: Patient denies SI/HI/AVH. Pt appears to minimizes drinking reports being sober for many months and had one drink with friend. Pt reports increase stress over the holidays with children not calling or sending a card. Pt stated having a job interview on Thursday after months of unemployment. Pt attended and participated in evening wrap up group. Cooperative with assessment.   A: Met with pt 1:1. Medications administered as prescribed. Support and encouragement provided to engage in milieu. Pt encouraged to discuss feelings and come to staff with any question or concerns.   R: Patient remains safe and complaint with medications.
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
## 2015-07-16 NOTE — Plan of Care (Signed)
Problem: Diagnosis: Increased Risk For Suicide Attempt Goal: STG-Patient Will Comply With Medication Regime Outcome: Progressing Pt compliant with medication regime     

## 2015-07-16 NOTE — Progress Notes (Signed)
Adult Psychoeducational Group Note  Date:  07/16/2015 Time:  11:28 PM  Group Topic/Focus:  Wrap-Up Group:   The focus of this group is to help patients review their daily goal of treatment and discuss progress on daily workbooks.  Participation Level:  Active  Participation Quality:  Appropriate and Attentive  Affect:  Appropriate  Cognitive:  Alert, Appropriate and Oriented  Insight: Appropriate  Engagement in Group:  Engaged  Modes of Intervention:  Discussion and Education  Additional Comments:  Pt attended and participated in group.  Pt is new to the unit this evening and and did not have a goal today.  Pt stated that she has been keeping her mind occupied since admission and rated her day a 7.5/10 with 10 being the best.   Kristen Roberson 07/16/2015, 11:28 PM
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
## 2015-07-16 NOTE — B[facility] Counselor (Signed)
Per AC, Inocencio Homes, pt accepted to [facility], Room 400-1, Attending Dr. Parke Poisson. Pt can come after 8 AM.   Faylene Kurtz, MS, CRC, [facility] Triage Specialist [facility]

## 2015-07-16 NOTE — ED Notes (Signed)
Telepsych in progress. 
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
## 2015-07-16 NOTE — ED Notes (Signed)
Pt transferred from main ed, presents with self inflicted lac to rt wrist, SI, denies HI or AVH.  Pt reports she does not want to be here, she has a interview this am.  Sutures to rt intact, dressing in place, clean & dry.  Smell of ETOH noted. AAO x 3, no acute distress noted, calm & anxious.  Monitoring for safety, Q 15 min checks in effect.  Denies feeling hopeless.

## 2015-07-16 NOTE — ED Notes (Signed)
Pt being sutured

## 2015-07-17 ENCOUNTER — Encounter (HOSPITAL_COMMUNITY): Payer: Self-pay | Admitting: Psychiatry

## 2015-07-17 DIAGNOSIS — F10230 Alcohol dependence with withdrawal, uncomplicated: Secondary | ICD-10-CM

## 2015-07-17 MED ORDER — SERTRALINE HCL 25 MG PO TABS
25.0000 mg | ORAL_TABLET | Freq: Every day | ORAL | Status: DC
  Administered 2015-07-17 – 2015-07-18 (×2): 25 mg via ORAL
  Filled 2015-07-17 (×4): qty 1
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
## 2015-07-17 NOTE — BHH Group Notes (Signed)
[place] LCSW Group Therapy 07/17/2015 1:15 PM  Type of Therapy: Group Therapy- Emotion Regulation  Pt arrived to group towards the end of discussion and did not participate.  Peri Maris, [place] 07/17/2015 4:42 PM

## 2015-07-17 NOTE — Tx Team (Signed)
Interdisciplinary Treatment Plan Update (Adult) Date: 07/17/2015   Date: 07/17/2015 2:55 PM  Progress in Treatment:  Attending groups: Yes  Participating in groups: Yes  Taking medication as prescribed: Yes  Tolerating medication: Yes  Family/Significant othe contact made: No, Pt declines Patient understands diagnosis: Continuing to assess; Pt minimizes events prior to admission Discussing patient identified problems/goals with staff: Yes  Medical problems stabilized or resolved: Yes  Denies suicidal/homicidal ideation: Yes  Patient has not harmed self or Others: Yes   New problem(s) identified: None identified at this time.   Discharge Plan or Barriers: Pt will return home and follow-up with [facility]  Additional comments:  Patient and CSW reviewed pt's identified goals and treatment plan. Patient verbalized understanding and agreed to treatment plan. CSW reviewed [facility] "Discharge Process and Patient Involvement" Form. Pt verbalized understanding of information provided and signed form.   Reason for Continuation of Hospitalization:  Depression Medication stabilization Suicidal ideation  Estimated length of stay: 3-5 days  Review of initial/current patient goals per problem list:   1.  Goal(s): Patient will participate in aftercare plan  Met:  Yes  Target date: 3-5 days from date of admission   As evidenced by: Patient will participate within aftercare plan AEB aftercare provider and housing plan at discharge being identified.   07/17/15: Pt will DC home and follow-up with [facility]  2.  Goal (s): Patient will exhibit decreased depressive symptoms and suicidal ideations.  Met:  Yes  Target date: 3-5 days from date of admission   As evidenced by: Patient will utilize self rating of depression at 3 or below and demonstrate decreased signs of depression or be deemed stable for discharge by MD.  07/17/15: Pt rates depression at 2/10; denies SI  Attendees:  Patient:     Family:    Physician: Dr. Parke Poisson, MD  07/17/2015 2:55 PM  Nursing: Lars Pinks, RN Case manager  07/17/2015 2:55 PM  Clinical Social Worker Peri Maris, [place] 07/17/2015 2:55 PM  Other: Tilden Fossa, [place] 07/17/2015 2:55 PM  Clinical:Christa Dopson, RN 07/17/2015 2:55 PM  Other: , RN Charge Nurse 07/17/2015 2:55 PM  Other: Hilda Lias, [place], [place] Work [phone]
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
## 2015-07-17 NOTE — Progress Notes (Signed)
Adult Psychoeducational Group Note  Date:  07/17/2015 Time:  10:40 PM  Group Topic/Focus:  Wrap-Up Group:   The focus of this group is to help patients review their daily goal of treatment and discuss progress on daily workbooks.  Participation Level:  Active  Participation Quality:  Appropriate and Sharing  Affect:  Appropriate  Cognitive:  Alert and Appropriate  Insight: Appropriate  Engagement in Group:  Engaged  Modes of Intervention:  Discussion  Additional Comments:  Pt rated day a 7. "Staff are nice." Pt shared that everyone here has different things going on and she doesn't feel as if she has been judged by anyone. Something positive was her fiance coming and she had a good talk with the doctor. A goal for pt is to focus on the future.   Bernardo Heater 07/17/2015, 10:40 PM

## 2015-07-17 NOTE — BHH Group Notes (Signed)
[facility] LCSW Aftercare Discharge Planning Group Note  07/17/2015 8:45 AM  Participation Quality: Alert, Appropriate and Oriented  Mood/Affect: Appropriate  Depression Rating: 2  Anxiety Rating: 3  Thoughts of Suicide: Pt denies SI/HI  Will you contract for safety? Yes  Current AVH: Pt denies  Plan for Discharge/Comments: Pt attended discharge planning group and actively participated in group. CSW discussed suicide prevention education with the group and encouraged them to discuss discharge planning and any relevant barriers. Pt expressed that she is feeling good this morning other than feeling embarrassed about cutting her wrist and drinking more than she should. Pt is hopeful to be discharged before noon tomorrow because she has a job interview.   Transportation Means: Pt reports access to transportation  Supports: No supports mentioned at this time  Peri Maris, [place] 07/17/2015 10:13 AM

## 2015-07-17 NOTE — BHH Suicide Risk Assessment (Signed)
[facility] INPATIENT:  Family/Significant Other Suicide Prevention Education  Suicide Prevention Education:  Patient Refusal for Family/Significant Other Suicide Prevention Education: The patient Kristen Roberson has refused to provide written consent for family/significant other to be provided Family/Significant Other Suicide Prevention Education during admission and/or prior to discharge.  Physician notified. SPE reviewed with patient and brochure provided. Patient encouraged to return to hospital if having suicidal thoughts, patient verbalized his/her understanding and has no further questions at this time.   Bo Mcclintock 07/17/2015, 2:57 PM

## 2015-07-17 NOTE — Progress Notes (Signed)
Patient ID: Kristen Roberson, female   DOB: November 02, 1975, 39 y.o.   MRN: [id number]  DAR: Pt. Denies SI/HI and A/V Hallucinations. She reports sleep is good, appetite is good, energy level is normal, and concentration level is good. She rates depression 3/10, hopelessness 1/10, and anxiety 2/10. She reports no withdrawals from alcohol and continues to minimize. Patient's right wrist was cleansed and rewrapped. She reports mild discomfort there. Area looks to be clean and sutures intact. Area does not appeared swollen or reddened. Support and encouragement provided to the patient. Scheduled medications administered to patient per physician's orders. Patient is receptive and cooperative. She is seen in the milieu and interacts with her peers appropriately. Q15 minute checks are maintained for safety.

## 2015-07-17 NOTE — Plan of Care (Signed)
Problem: Diagnosis: Increased Risk For Suicide Attempt Goal: STG-Patient Will Report Suicidal Feelings to Staff Outcome: Progressing Denies SI     

## 2015-07-17 NOTE — [facility] Suicide Risk Assessment (Signed)
[facility] Admission Suicide Risk Assessment   Nursing information obtained from:   patient and chart Demographic factors:   39 year old female, divorced, currently unemployed  Current Mental Status:   see below  Loss Factors:   poor family support system Historical Factors:   alcohol dependence  Risk Reduction Factors:   sense of responsibility to family Total Time spent with patient: 45 minutes Principal Problem: alcohol dependence  Diagnosis:   Patient Active Problem List   Diagnosis Date Noted  . Alcohol dependence with withdrawal, uncomplicated ([place]) [id number] 03/17/2015  . Hepatic encephalopathy ([place]) [K72.90] 02/23/2015  . Seizure ([place]) [R56.9] 02/23/2015  . Pancytopenia ([place]) [id number] 02/23/2015  . Internal hemorrhoids [K64.8] 02/23/2015  . Alcohol intoxication ([place]) [F10.129]   . Hemorrhoid [K64.9]   . C. difficile colitis [A04.7]   . Alcohol abuse [F10.10]   . Elevated liver enzymes [R74.8]   . Colitis [K52.9] 12/12/2014  . Abrasion of wrist [S60.819A]   . Alcohol dependence with uncomplicated withdrawal ([place]) [F10.230]   . GAD (generalized anxiety disorder) [F41.1] 07/01/2014  . Alcohol withdrawal syndrome without complication ([place]) [id number] 06/30/2014  . MDD (major depressive disorder), recurrent episode, moderate ([place]) [F33.1]   . Alcohol use disorder, severe, dependence ([place]) [F10.20]   . Alcohol dependence with withdrawal with complication ([place]) [id number] 05/19/2014  . Thrombocytopenia ([place]) [D69.6] 05/07/2014  . Hypokalemia [E87.6] 05/07/2014  . Right sided weakness [M62.89] 04/15/2014  . Seizures ([place]) [R56.9] 04/04/2014  . Substance induced mood disorder ([place]) [F19.94] 03/26/2014  . Status post myocardial infarction [I25.2] 03/06/2014  . Depression [F32.9] 03/06/2014  . Substance abuse [F19.10] 02/22/2014  . Acute respiratory failure with hypoxia ([place]) [J96.01] 02/22/2014  . Ventricular fibrillation ([place]) [I49.01] 02/22/2014  . Acute systolic heart failure - s/p  VF Cardiac Arrest [I50.21] 02/22/2014  . Acute encephalopathy [G93.40] 02/22/2014  . Acute confusional state [F05] 02/22/2014  . Moderate malnutrition ([place]) [E44.0] 02/21/2014  . Convulsions/seizures ([place]) [R56.9] 02/14/2014  . Cardiac arrest ([place]) [I46.9] 02/13/2014  . Alcohol dependence ([place]) [F10.20] 01/06/2014  . Severe alcohol use disorder ([place]) [F10.20] 01/06/2014  . Adjustment disorder with depressed mood [F43.21] 12/14/2013  . 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 Folate deficiency [E53.8] 12/11/2013  . Alcoholism ([place]) [F10.20] 12/11/2013  . Alcohol withdrawal ([place]) [F10.239] 12/11/2013  . Hypokalemia [E87.6] 12/11/2013  . Malnutrition of moderate degree ([place]) [E44.0] 12/10/2013  . Abdominal pain [R10.9] 11/25/2013  . Mallory-Weiss tear [K22.6] 11/25/2013  . Acute blood loss anemia [D62] 11/24/2013  . Hematemesis [K92.0] 11/23/2013  . Fatty liver [K76.0] 10/29/2013  . Internal hemorrhoids with other complication [id number]  . Hematochezia [K92.1] 10/28/2013  . Normocytic anemia [D64.9] 10/28/2013  . GIB (gastrointestinal bleeding) [K92.2] 10/28/2013  . Anxiety [F41.9]   . Post-operative state [Z98.890] 09/29/2013  . Postoperative state [Z98.890] 09/28/2013  . Female pelvic pain [R10.2] 09/21/2013  . Menorrhagia [N92.0] 09/21/2013  . History of ovarian cyst [Z87.42] 09/21/2013  . 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 Dysmenorrhea [N94.6] 09/21/2013     Continued Clinical Symptoms:  Alcohol Use Disorder Identification Test Final Score (AUDIT): 3 The "Alcohol Use Disorders Identification Test", Guidelines for Use in Primary Care, Second Edition.  World [facility]). Score between 0-7:  no or low risk or alcohol related problems. Score between 8-15:  moderate risk of alcohol related problems. Score between 16-19:  high risk of alcohol related problems. Score 20 or above:  warrants further diagnostic evaluation for alcohol dependence  and treatment.   CLINICAL FACTORS:  39 year old female, history of alcohol dependence, recently relapsed and felt more depressed, in the context of holidays and poor family support/ not being able to see her children. Suicide gesture by cutting wrist .     Psychiatric Specialty Exam: Physical Exam  ROS  Blood pressure 120/80, pulse 88, temperature 97.5 F (36.4 C), temperature source Oral, resp. rate 16, height 5' 1.25" (1.556 m), weight 150 lb (68.04 kg).Body mass index is 28.1 kg/(m^2).   see admit note MSE   COGNITIVE FEATURES THAT CONTRIBUTE TO RISK:  Closed-mindedness and Loss of executive function    SUICIDE RISK:   Moderate:  Frequent suicidal ideation with limited intensity, and duration, some specificity in terms of plans, no associated intent, good self-control, limited dysphoria/symptomatology, some risk factors present, and identifiable protective factors, including available and accessible social support.  PLAN OF CARE: Patient will be admitted to inpatient psychiatric unit for stabilization and safety. Will provide and encourage milieu participation. Provide medication management and maked adjustments as needed. Will also provide medication management to minimize risk of withdrawal.  Will follow daily.    Medical Decision Making:  Review of Psycho-Social Stressors (1), Review or order clinical lab tests (1), Established Problem, Worsening (2) and Review of Medication Regimen & Side Effects (2)  I certify that inpatient services furnished can reasonably be expected to improve the patient's condition.   COBOS, FERNANDO 07/17/2015, 11:57 AM

## 2015-07-18 MED ORDER — TRAZODONE HCL 50 MG PO TABS
50.0000 mg | ORAL_TABLET | Freq: Every evening | ORAL | Status: DC | PRN
  Administered 2015-07-18 – 2015-07-19 (×2): 50 mg via ORAL
  Filled 2015-07-18 (×2): qty 1

## 2015-07-18 MED ORDER — SERTRALINE HCL 50 MG PO TABS
50.0000 mg | ORAL_TABLET | Freq: Every day | ORAL | Status: DC
  Administered 2015-07-19: 50 mg via ORAL
  Filled 2015-07-18 (×3): qty 1

## 2015-07-18 NOTE — Progress Notes (Signed)
Adult Psychoeducational Group Note  Date:  07/18/2015 Time:  10:22 PM  Group Topic/Focus:  Wrap-Up Group:   The focus of this group is to help patients review their daily goal of treatment and discuss progress on daily workbooks.  Participation Level:  Active  Participation Quality:  Appropriate  Affect:  Appropriate  Cognitive:  Alert  Insight: Appropriate  Engagement in Group:  Engaged  Modes of Intervention:  Discussion  Additional Comments:  Patient stated her goal for today was to stay focus. Patient stated something positive that happened today was her boyfriend coming to visit today. Patient stated she is discharging on Saturday.   Kristen Roberson 07/18/2015, 10:22 PM

## 2015-07-18 NOTE — Progress Notes (Signed)
Patient ID: Kristen Roberson, female   DOB: 08-Nov-1975, 39 y.o.   MRN: [id number]   Pt currently presents with a pleasant affect and anxious behavior. Per self inventory, pt rates depression at a 3, hopelessness 2 and anxiety 4. Pt's daily goal is to "concentrate on today not the past" and they intend to do so by "group." Pt reports good sleep, a good appetite, normal energy and good concentration. Pt reports that she currently has questions about "sleep meds."  Pt provided with medications per providers orders. Pt's labs and vitals were monitored throughout the day. Pt supported emotionally and encouraged to express concerns and questions. Pt educated on medications. Pt R wrist wound and stitches assessed, new bandage placed, no drainage noted.   Pt's safety ensured with 15 minute and environmental checks. Pt currently denies SI/HI and A/V hallucinations. Pt verbally agrees to seek staff if SI/HI or A/VH occurs and to consult with staff before acting on these thoughts. Pt interacts with peers, participates in groups today. Pt reports "I'm looking forward to going home, I won't do anything like that ever again." Pt reports "When I need a reminder I'll look down at my wrist."Will continue POC.

## 2015-07-18 NOTE — [facility] Group Notes (Signed)
[facility] Mental [employer] Group Therapy 07/18/2015 1:15pm  Type of Therapy: Mental Health Association Presentation  Participation Level: Active  Participation Quality: Attentive  Affect: Appropriate  Cognitive: Oriented  Insight: Developing/Improving  Engagement in Therapy: Engaged  Modes of Intervention: Discussion, Education and Socialization  Summary of Progress/Problems: Mental Health Association (MHA) Speaker came to talk about his personal journey with substance abuse and addiction. The pt processed ways by which to relate to the speaker. MHA speaker provided handouts and educational information pertaining to groups and services offered by the MHA. Pt was engaged in speaker's presentation and was receptive to resources provided.    Jodeci Roarty, MSW, LCSW Clinical Social Worker   

## 2015-07-18 NOTE — Progress Notes (Signed)
D: Kristen Roberson states her goal today was to focus on the present and not the past. She rates Anxiety 7/10 Depression 2/10 Denies SI/HI/AVH. Contracts for safety. She attended group this evening.  A: Encouragement and support given. Medications administered as prescribed. Q 15 minute checks for patient safety and medication effectiveness.  R: Continue to monitor for patient safety and medication effectiveness.

## 2015-07-18 NOTE — Progress Notes (Signed)
[facility] MD Progress Note  07/18/2015 4:26 PM GENAVIEVE MANGIAPANE  MRN:  [id number] Subjective:   Patient states she is feeling " OK". At this time minimizes depression, denies any suicidal ideations. Denies cravings for alcohol. She is tolerating medications well. Objective : I have discussed case with treatment team and have met with patient. Patient presents with improved mood, improved range of affect, and is visible on unit , going to groups. Denies medication side effects. At this time no symptoms of alcohol withdrawal- no tremors ,no diaphoresis, no restlessness  Has had no seizures on unit. As discussed with staff tends to minimize severity of alcohol dependence and focuses on discharge soon. We discussed disposition options with patient- she is not interested in going to an inpatient/rehab setting at this time and intends to return home. She does states she plans to go to AA " every day". Denies cravings for alcohol - we discussed Campral trial , states she tried it in the past but did not seem to work for her.  Principal Problem: Alcohol use disorder, severe, dependence ([place]) Diagnosis:   Patient Active Problem List   Diagnosis Date Noted  . Alcohol dependence with withdrawal, uncomplicated ([place]) [R00.762] 03/17/2015  . Hepatic encephalopathy ([place]) [K72.90] 02/23/2015  . Seizure ([place]) [R56.9] 02/23/2015  . Pancytopenia ([place]) [U63.335] 02/23/2015  . Internal hemorrhoids [K64.8] 02/23/2015  . Alcohol intoxication ([place]) [F10.129]   . Hemorrhoid [K64.9]   . C. difficile colitis [A04.7]   . Alcohol abuse [F10.10]   . Elevated liver enzymes [R74.8]   . Colitis [K52.9] 12/12/2014  . Abrasion of wrist [S60.819A]   . Alcohol dependence with uncomplicated withdrawal ([place]) [F10.230]   . GAD (generalized anxiety disorder) [F41.1] 07/01/2014  . Alcohol withdrawal syndrome without complication ([place]) [K56.256] 06/30/2014  . MDD (major depressive disorder), recurrent episode, moderate ([place]) [F33.1]    . Alcohol use disorder, severe, dependence ([place]) [F10.20]   . Alcohol dependence with withdrawal with complication ([place]) [L89.373] 05/19/2014  . Thrombocytopenia ([place]) [D69.6] 05/07/2014  . Hypokalemia [E87.6] 05/07/2014  . Right sided weakness [M62.89] 04/15/2014  . Seizures ([place]) [R56.9] 04/04/2014  . Substance induced mood disorder ([place]) [F19.94] 03/26/2014  . Status post myocardial infarction [I25.2] 03/06/2014  . Depression [F32.9] 03/06/2014  . Substance abuse [F19.10] 02/22/2014  . Acute respiratory failure with hypoxia ([place]) [J96.01] 02/22/2014  . Ventricular fibrillation ([place]) [I49.01] 02/22/2014  . Acute systolic heart failure - s/p VF Cardiac Arrest [I50.21] 02/22/2014  . Acute encephalopathy [G93.40] 02/22/2014  . Acute confusional state [F05] 02/22/2014  . Moderate malnutrition ([place]) [E44.0] 02/21/2014  . Convulsions/seizures ([place]) [R56.9] 02/14/2014  . Cardiac arrest ([place]) [I46.9] 02/13/2014  . Alcohol dependence ([place]) [F10.20] 01/06/2014  . Severe alcohol use disorder ([place]) [F10.20] 01/06/2014  . Adjustment disorder with depressed mood [F43.21] 12/14/2013  . 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 Folate deficiency [E53.8] 12/11/2013  . Alcoholism ([place]) [F10.20] 12/11/2013  . Alcohol withdrawal ([place]) [F10.239] 12/11/2013  . Hypokalemia [E87.6] 12/11/2013  . Malnutrition of moderate degree ([place]) [E44.0] 12/10/2013  . Abdominal pain [R10.9] 11/25/2013  . Mallory-Weiss tear [K22.6] 11/25/2013  . Acute blood loss anemia [D62] 11/24/2013  . Hematemesis [K92.0] 11/23/2013  . Fatty liver [K76.0] 10/29/2013  . Internal hemorrhoids with other complication [S28.7] 68/05/5725  . Hematochezia [K92.1] 10/28/2013  . Normocytic anemia [D64.9] 10/28/2013  . GIB (gastrointestinal bleeding) [K92.2] 10/28/2013  . Anxiety [F41.9]   . Post-operative state [Z98.890] 09/29/2013  . Postoperative state [Z98.890] 09/28/2013  . Female pelvic pain [  R10.2] 09/21/2013   . Menorrhagia [N92.0] 09/21/2013  . History of ovarian cyst [Z87.42] 09/21/2013  . 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 Dysmenorrhea [N94.6] 09/21/2013   Total Time spent with patient: 20 minutes    Past Medical History:  Past Medical History  Diagnosis Date  . Proctitis   . Cysts of both ovaries   . Anemia   . Anxiety   . Blood transfusion without reported diagnosis   . Depression   . Fatty liver 10/05/13  . Cardiac arrest ([place])   . Seizures ([place])   . Alcohol abuse     Past Surgical History  Procedure Laterality Date  . Ovarian cyst removal    . Laparoscopy N/A 09/28/2013    Procedure: LAPAROSCOPY OPERATIVE;  Surgeon: Terrance Mass, MD;  Location: [facility];  Service: Gynecology;  Laterality: N/A;  . Laparoscopic appendectomy Right 09/28/2013    Procedure: APPENDECTOMY LAPAROSCOPIC;  Surgeon: Terrance Mass, MD;  Location: [facility];  Service: Gynecology;  Laterality: Right;  . Salpingoophorectomy Right 09/28/2013    Procedure: SALPINGO OOPHORECTOMY;  Surgeon: Terrance Mass, MD;  Location: [facility];  Service: Gynecology;  Laterality: Right;  . Colonoscopy N/A 09/30/2013    Procedure: COLONOSCOPY;  Surgeon: Lafayette Dragon, MD;  Location: [facility];  Service: Endoscopy;  Laterality: N/A;  . Esophagogastroduodenoscopy N/A 11/23/2013    Procedure: ESOPHAGOGASTRODUODENOSCOPY (EGD);  Surgeon: Jerene Bears, MD;  Location: [facility];  Service: Endoscopy;  Laterality: N/A;  . Appendectomy    . Left and right heart catheterization with coronary angiogram N/A 02/23/2014    Procedure: LEFT AND RIGHT HEART CATHETERIZATION WITH CORONARY ANGIOGRAM;  Surgeon: Leonie Man, MD;  Location: [facility];  Service: Cardiovascular;  Laterality: N/A;   Family History:  Family History  Problem Relation Age of Onset  . Diabetes Mother   . Hyperlipidemia Mother   . Stroke Mother   . Diabetes Father    Social History:  History  Alcohol Use  . Yes    Comment: occaisonal; "hadn't been drinking"  until time she cut herself.     History  Drug Use No    Social History   Social History  . Marital Status: Legally Separated    Spouse Name: N/A  . Number of Children: N/A  . Years of Education: N/A   Social History Main Topics  . Smoking status: Never Smoker   . Smokeless tobacco: Never Used  . Alcohol Use: Yes     Comment: occaisonal; "hadn't been drinking" until time she cut herself.  . Drug Use: No  . Sexual Activity: Not Asked   Other Topics Concern  . None   Social History Narrative   Additional Social History:   Sleep: Good  Appetite:  Good  Current Medications: Current Facility-Administered Medications  Medication Dose Route Frequency Provider Last Rate Last Dose  . acetaminophen (TYLENOL) tablet 650 mg  650 mg Oral Q4H PRN Delfin Gant, NP   650 mg at 07/16/15 2118  . alum & mag hydroxide-simeth (MAALOX/MYLANTA) 200-200-20 MG/5ML suspension 30 mL  30 mL Oral PRN Delfin Gant, NP      . carvedilol (COREG) tablet 1.5625 mg  1.5625 mg Oral Daily Delfin Gant, NP   1.5625 mg at 07/18/15 0754  . hydrOXYzine (ATARAX/VISTARIL) tablet 25 mg  25 mg Oral Q6H PRN Jenne Campus, MD   25 mg at 07/17/15 2119  . ibuprofen (ADVIL,MOTRIN) tablet 600 mg  600 mg Oral Q8H PRN Reginold Agent  Sharlene Motts, NP      . levETIRAcetam (KEPPRA) tablet 1,000 mg  1,000 mg Oral BID Delfin Gant, NP   1,000 mg at 07/18/15 0754  . loperamide (IMODIUM) capsule 2-4 mg  2-4 mg Oral PRN Jenne Campus, MD      . LORazepam (ATIVAN) tablet 1 mg  1 mg Oral Q6H PRN Jenne Campus, MD      . LORazepam (ATIVAN) tablet 1 mg  1 mg Oral BID Jenne Campus, MD   1 mg at 07/18/15 0754   Followed by  . [START ON 07/19/2015] LORazepam (ATIVAN) tablet 1 mg  1 mg Oral Daily Corban Kistler A Lorrena Goranson, MD      . LORazepam (ATIVAN) tablet 1 mg  1 mg Oral Once Jenne Campus, MD   1 mg at 07/16/15 1548  . magnesium hydroxide (MILK OF MAGNESIA) suspension 30 mL  30 mL Oral Daily PRN Delfin Gant, NP      . multivitamin with minerals tablet 1 tablet  1 tablet Oral Daily Jenne Campus, MD   1 tablet at 07/18/15 0754  . ondansetron (ZOFRAN-ODT) disintegrating tablet 4 mg  4 mg Oral Q6H PRN Jenne Campus, MD      . Derrill Memo ON 07/19/2015] sertraline (ZOLOFT) tablet 50 mg  50 mg Oral Daily Timarie Labell A Asriel Westrup, MD      . thiamine (B-1) injection 100 mg  100 mg Intramuscular Once Jenne Campus, MD   100 mg at 07/16/15 1731  . thiamine (VITAMIN B-1) tablet 100 mg  100 mg Oral Daily Jenne Campus, MD   100 mg at 07/18/15 0754  . traZODone (DESYREL) tablet 50 mg  50 mg Oral QHS PRN Jenne Campus, MD        Lab Results: No results found for this or any previous visit (from the past 48 hour(s)).  Physical Findings: AIMS: Facial and Oral Movements Muscles of Facial Expression: None, normal Lips and Perioral Area: None, normal Jaw: None, normal Tongue: None, normal,Extremity Movements Upper (arms, wrists, hands, fingers): None, normal Lower (legs, knees, ankles, toes): None, normal, Trunk Movements Neck, shoulders, hips: None, normal, Overall Severity Severity of abnormal movements (highest score from questions above): None, normal Incapacitation due to abnormal movements: None, normal Patient's awareness of abnormal movements (rate only patient's report): No Awareness, Dental Status Current problems with teeth and/or dentures?: No Does patient usually wear dentures?: No  CIWA:  CIWA-Ar Total: 2 COWS:     Musculoskeletal: Strength & Muscle Tone: within normal limits- no tremors , no diaphoresis Gait & Station: normal Patient leans: N/A  Psychiatric Specialty Exam: ROS denies headaches, denies shortness of breath, denies nausea, denies vomiting  Blood pressure 113/80, pulse 102, temperature 97.7 F (36.5 C), temperature source Oral, resp. rate 12, height 5' 1.25" (1.556 m), weight 150 lb (68.04 kg).Body mass index is 28.1 kg/(m^2).  General Appearance: improved  grooming   Eye Contact::  Good  Speech:  Normal Rate  Volume:  Normal  Mood:  denies depression, states mood "OK"  Affect:  Appropriate  Thought Process:  Linear  Orientation:  Full (Time, Place, and Person)  Thought Content:  denies hallucinations, no delusions   Suicidal Thoughts:  No denies any suicidal or self injurious ideations  Homicidal Thoughts:  No  Memory:  recent and remote grossly intact   Judgement:  Fair  Insight:  Fair  Psychomotor Activity:  Normal- no tremors, no diaphoresis, no psychomotor agitation  Concentration:  Good  Recall:  Good  Fund of Knowledge:Good  Language: Good  Akathisia:  Negative  Handed:  Right  AIMS (if indicated):     Assets:  Desire for Improvement Resilience  ADL's:  Intact  Cognition: WNL  Sleep:     Assessment - patient tolerating detox protocol well and is not exhibiting any WDL symptoms at this time. She denies depression at present. Has history of seizure disorder, but has had no seizures on unit and is tolerating Keppra well. As reviewed with staff,  Patient acknowledges alcohol use disorder but tends to minimize severity of this disease . I have encouraged her to consider Rehab setting after discharge, but not currently interested .  Treatment Plan Summary: Daily contact with patient to assess and evaluate symptoms and progress in treatment, Medication management, Plan inpatient admission and medications as below  Encourage ongoing group participation to work on coping skills and symptom reduction Continue to encourage full abstinence from alcohol , recovery efforts Continue Ativan detox protocol to minimize risk of alcohol withdrawal  Continue  Keppra for history of Seizure disorder  Increase Zoloft  To 50 mgrs QDAY for management of anxiety and depression Continue Trazodone 50 mgrs QHS PRN for insomnia as needed  Aulani Shipton 07/18/2015, 4:26 PM

## 2015-07-18 NOTE — Progress Notes (Signed)
Patient ID: Kristen Roberson, female   DOB: 1975/08/04, 39 y.o.   MRN: [id number]  Adult Psychoeducational Group Note  Date:  07/18/2015 Time:  09:00am  Group Topic/Focus:  Self Care:   The focus of this group is to help patients understand the importance of self-care in order to improve or restore emotional, physical, spiritual, interpersonal, and financial health.  Participation Level:  Active  Participation Quality:  Attentive  Affect:  Appropriate  Cognitive:  Appropriate  Insight: Improving  Engagement in Group:  Engaged  Modes of Intervention:  Activity, Discussion, Education and Support  Additional Comments:  Pt able to identify at least one relaxation technique to utilize post discharge from [facility].   Elenore Rota 07/18/2015, 11:35 AM

## 2015-07-19 DIAGNOSIS — F1020 Alcohol dependence, uncomplicated: Principal | ICD-10-CM

## 2015-07-19 MED ORDER — IBUPROFEN 600 MG PO TABS
600.0000 mg | ORAL_TABLET | Freq: Three times a day (TID) | ORAL | Status: DC | PRN
  Administered 2015-07-19: 600 mg via ORAL

## 2015-07-19 MED ORDER — IBUPROFEN 600 MG PO TABS
ORAL_TABLET | ORAL | Status: AC
  Administered 2015-07-19: 600 mg via ORAL
  Filled 2015-07-19: qty 1

## 2015-07-19 MED ORDER — SERTRALINE HCL 25 MG PO TABS
75.0000 mg | ORAL_TABLET | Freq: Every day | ORAL | Status: DC
  Administered 2015-07-20: 75 mg via ORAL
  Filled 2015-07-19: qty 3
  Filled 2015-07-19: qty 21
  Filled 2015-07-19: qty 3

## 2015-07-19 MED ORDER — BACITRACIN-NEOMYCIN-POLYMYXIN 400-5-5000 EX OINT
TOPICAL_OINTMENT | CUTANEOUS | Status: DC | PRN
Start: 2015-07-19 — End: 2015-07-20
  Administered 2015-07-19: 1 via TOPICAL
  Filled 2015-07-19 (×3): qty 1

## 2015-07-19 NOTE — Progress Notes (Signed)
D: Hassana states her goal today was to just focus on the day. She went to group and was an active participant. She rates anxiety  3/10 Depression 3/10 and Hopelessness 3/10. She is still minimally interactive with this RN however she remains pleasant and approachable. She denies SI/HI/AVH and contracts for safety at this time.  A: Q 15 minute checks for patient safety. Encouragement and support given. Medications administered as prescribed.  R: Continue to monitor for patient safety and medication effectiveness.

## 2015-07-19 NOTE — Progress Notes (Signed)
Adult Psychoeducational Group Note  Date:  07/19/2015 Time:  9:04 PM  Group Topic/Focus:  Wrap-Up Group:   The focus of this group is to help patients review their daily goal of treatment and discuss progress on daily workbooks.  Participation Level:  Active  Participation Quality:  Appropriate  Affect:  Appropriate  Cognitive:  Alert  Insight: Appropriate  Engagement in Group:  Engaged  Modes of Intervention:  Discussion  Additional Comments:  Patient stated having a good day. Patient stated she is ready for discharge tomorrow.   Kristen Roberson 07/19/2015, 9:04 PM

## 2015-07-19 NOTE — [facility] Group Notes (Signed)
[facility] LCSW Group Therapy  07/19/2015 2:26 PM  Type of Therapy: Group Therapy- Feelings Around Relapse and Recovery  Participation Level: Active   Participation Quality: Appropriate  Affect: Appropriate  Cognitive: Alert and Oriented   Insight: Developing   Engagement in Therapy: Developing/Improving and Engaged   Modes of Intervention: Clarification, Confrontation, Discussion, Education, Exploration, Limit-setting, Orientation, Problem-solving, Rapport Building, Art therapist, Socialization and Support  Summary of Progress/Problems: The topic for today was feelings about relapse. The group discussed what relapse prevention is to them and identified triggers that they are on the path to relapse. Members also processed their feeling towards relapse and were able to relate to common experiences. Group also discussed coping skills that can be used for relapse prevention. Breann was observed to be attentive in group but provided limited contribution to the discussion. She did shared that bad news and stress is what triggered her relapse to depression. She ended group stating that she has limited support and that most of her family resides in [place].    Therapeutic Modalities:  Cognitive Behavioral Therapy Solution-Focused Therapy Assertiveness Training Relapse Prevention Therapy  PICKETT JR, Izak Anding C 07/19/2015, 2:26 PM

## 2015-07-19 NOTE — Progress Notes (Signed)
Patient ID: Kristen Roberson, female   DOB: 29-Jun-1976, 39 y.o.   MRN: [id number] [facility] MD Progress Note  07/19/2015 2:37 PM Kristen Roberson  MRN:  [id number] Subjective:    Patient reports she continues to feel better . She denies alcohol withdrawal symptoms at this time. She is hoping for discharge soon.  Denies medication side effects. Sleep reported as fair. Objective : I have discussed case with treatment team and have met with patient. Patient is reporting improved mood, and at this time denies depression. She states she is feeling more optimistic and " in good spirits ".  She plans to return home after discharge , and states plans to go to [place] regularly after discharge. She is looking forward to a job interview  She has lined up for early next week. States she is hoping to get job as she feels this will help her have more structured days .  Denies cravings for alcohol.  States she has tried Campral in the past but did not work, we also discussed Naltrexone as an option, which she states she will discuss with her outpatient provider . Denies medication side effects. At this time no symptoms of alcohol withdrawal- no tremors ,no diaphoresis, no restlessness  Vitals stable .   Principal Problem: Alcohol use disorder, severe, dependence ([place]) Diagnosis:   Patient Active Problem List   Diagnosis Date Noted  . Alcohol dependence with withdrawal, uncomplicated ([place]) [O70.962] 03/17/2015  . Hepatic encephalopathy ([place]) [K72.90] 02/23/2015  . Seizure ([place]) [R56.9] 02/23/2015  . Pancytopenia ([place]) [E36.629] 02/23/2015  . Internal hemorrhoids [K64.8] 02/23/2015  . Alcohol intoxication ([place]) [F10.129]   . Hemorrhoid [K64.9]   . C. difficile colitis [A04.7]   . Alcohol abuse [F10.10]   . Elevated liver enzymes [R74.8]   . Colitis [K52.9] 12/12/2014  . Abrasion of wrist [S60.819A]   . Alcohol dependence with uncomplicated withdrawal ([place]) [F10.230]   . GAD (generalized anxiety disorder) [F41.1] 07/01/2014   . Alcohol withdrawal syndrome without complication ([place]) [U76.546] 06/30/2014  . MDD (major depressive disorder), recurrent episode, moderate ([place]) [F33.1]   . Alcohol use disorder, severe, dependence ([place]) [F10.20]   . Alcohol dependence with withdrawal with complication ([place]) [T03.546] 05/19/2014  . Thrombocytopenia ([place]) [D69.6] 05/07/2014  . Hypokalemia [E87.6] 05/07/2014  . Right sided weakness [M62.89] 04/15/2014  . Seizures ([place]) [R56.9] 04/04/2014  . Substance induced mood disorder ([place]) [F19.94] 03/26/2014  . Status post myocardial infarction [I25.2] 03/06/2014  . Depression [F32.9] 03/06/2014  . Substance abuse [F19.10] 02/22/2014  . Acute respiratory failure with hypoxia ([place]) [J96.01] 02/22/2014  . Ventricular fibrillation ([place]) [I49.01] 02/22/2014  . Acute systolic heart failure - s/p VF Cardiac Arrest [I50.21] 02/22/2014  . Acute encephalopathy [G93.40] 02/22/2014  . Acute confusional state [F05] 02/22/2014  . Moderate malnutrition ([place]) [E44.0] 02/21/2014  . Convulsions/seizures ([place]) [R56.9] 02/14/2014  . Cardiac arrest ([place]) [I46.9] 02/13/2014  . Alcohol dependence ([place]) [F10.20] 01/06/2014  . Severe alcohol use disorder ([place]) [F10.20] 01/06/2014  . Adjustment disorder with depressed mood [F43.21] 12/14/2013  . 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 Folate deficiency [E53.8] 12/11/2013  . Alcoholism ([place]) [F10.20] 12/11/2013  . Alcohol withdrawal ([place]) [F10.239] 12/11/2013  . Hypokalemia [E87.6] 12/11/2013  . Malnutrition of moderate degree ([place]) [E44.0] 12/10/2013  . Abdominal pain [R10.9] 11/25/2013  . Mallory-Weiss tear [K22.6] 11/25/2013  . Acute blood loss anemia [D62] 11/24/2013  . Hematemesis [K92.0] 11/23/2013  . Fatty liver [K76.0] 10/29/2013  . Internal hemorrhoids with other complication [F68.1]  10/29/2013  . Hematochezia [K92.1] 10/28/2013  . Normocytic anemia [D64.9] 10/28/2013  . GIB (gastrointestinal bleeding) [K92.2]  10/28/2013  . Anxiety [F41.9]   . Post-operative state [Z98.890] 09/29/2013  . Postoperative state [Z98.890] 09/28/2013  . Female pelvic pain [R10.2] 09/21/2013  . Menorrhagia [N92.0] 09/21/2013  . History of ovarian cyst [Z87.42] 09/21/2013  . 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 Dysmenorrhea [N94.6] 09/21/2013   Total Time spent with patient: 20 minutes    Past Medical History:  Past Medical History  Diagnosis Date  . Proctitis   . Cysts of both ovaries   . Anemia   . Anxiety   . Blood transfusion without reported diagnosis   . Depression   . Fatty liver 10/05/13  . Cardiac arrest ([place])   . Seizures ([place])   . Alcohol abuse     Past Surgical History  Procedure Laterality Date  . Ovarian cyst removal    . Laparoscopy N/A 09/28/2013    Procedure: LAPAROSCOPY OPERATIVE;  Surgeon: Terrance Mass, MD;  Location: [facility];  Service: Gynecology;  Laterality: N/A;  . Laparoscopic appendectomy Right 09/28/2013    Procedure: APPENDECTOMY LAPAROSCOPIC;  Surgeon: Terrance Mass, MD;  Location: [facility];  Service: Gynecology;  Laterality: Right;  . Salpingoophorectomy Right 09/28/2013    Procedure: SALPINGO OOPHORECTOMY;  Surgeon: Terrance Mass, MD;  Location: [facility];  Service: Gynecology;  Laterality: Right;  . Colonoscopy N/A 09/30/2013    Procedure: COLONOSCOPY;  Surgeon: Lafayette Dragon, MD;  Location: [facility];  Service: Endoscopy;  Laterality: N/A;  . Esophagogastroduodenoscopy N/A 11/23/2013    Procedure: ESOPHAGOGASTRODUODENOSCOPY (EGD);  Surgeon: Jerene Bears, MD;  Location: [facility];  Service: Endoscopy;  Laterality: N/A;  . Appendectomy    . Left and right heart catheterization with coronary angiogram N/A 02/23/2014    Procedure: LEFT AND RIGHT HEART CATHETERIZATION WITH CORONARY ANGIOGRAM;  Surgeon: Leonie Man, MD;  Location: [facility];  Service: Cardiovascular;  Laterality: N/A;   Family History:  Family History  Problem Relation Age of Onset  . Diabetes Mother    . Hyperlipidemia Mother   . Stroke Mother   . Diabetes Father    Social History:  History  Alcohol Use  . Yes    Comment: occaisonal; "hadn't been drinking" until time she cut herself.     History  Drug Use No    Social History   Social History  . Marital Status: Legally Separated    Spouse Name: N/A  . Number of Children: N/A  . Years of Education: N/A   Social History Main Topics  . Smoking status: Never Smoker   . Smokeless tobacco: Never Used  . Alcohol Use: Yes     Comment: occaisonal; "hadn't been drinking" until time she cut herself.  . Drug Use: No  . Sexual Activity: Not Asked   Other Topics Concern  . None   Social History Narrative   Additional Social History:   Sleep:  Fair   Appetite:  Good  Current Medications: Current Facility-Administered Medications  Medication Dose Route Frequency Provider Last Rate Last Dose  . acetaminophen (TYLENOL) tablet 650 mg  650 mg Oral Q4H PRN Delfin Gant, NP   650 mg at 07/19/15 4373  . alum & mag hydroxide-simeth (MAALOX/MYLANTA) 200-200-20 MG/5ML suspension 30 mL  30 mL Oral PRN Delfin Gant, NP      . carvedilol (COREG) tablet 1.5625 mg  1.5625 mg Oral Daily Delfin Gant, NP  1.5625 mg at 07/19/15 0823  . hydrOXYzine (ATARAX/VISTARIL) tablet 25 mg  25 mg Oral Q6H PRN Jenne Campus, MD   25 mg at 07/18/15 2124  . ibuprofen (ADVIL,MOTRIN) tablet 600 mg  600 mg Oral Q8H PRN Encarnacion Slates, NP   600 mg at 07/19/15 1230  . levETIRAcetam (KEPPRA) tablet 1,000 mg  1,000 mg Oral BID Delfin Gant, NP   1,000 mg at 07/19/15 7588  . loperamide (IMODIUM) capsule 2-4 mg  2-4 mg Oral PRN Jenne Campus, MD      . LORazepam (ATIVAN) tablet 1 mg  1 mg Oral Q6H PRN Myer Peer Hallie Ertl, MD      . LORazepam (ATIVAN) tablet 1 mg  1 mg Oral Once Jenne Campus, MD   1 mg at 07/16/15 1548  . magnesium hydroxide (MILK OF MAGNESIA) suspension 30 mL  30 mL Oral Daily PRN Delfin Gant, NP      .  multivitamin with minerals tablet 1 tablet  1 tablet Oral Daily Jenne Campus, MD   1 tablet at 07/19/15 [phone]  . neomycin-bacitracin-polymyxin (NEOSPORIN) ointment   Topical PRN Encarnacion Slates, NP   1 application at 98/26/41 1153  . ondansetron (ZOFRAN-ODT) disintegrating tablet 4 mg  4 mg Oral Q6H PRN Jenne Campus, MD      . sertraline (ZOLOFT) tablet 50 mg  50 mg Oral Daily Jenne Campus, MD   50 mg at 07/19/15 5830  . thiamine (B-1) injection 100 mg  100 mg Intramuscular Once Jenne Campus, MD   100 mg at 07/16/15 1731  . thiamine (VITAMIN B-1) tablet 100 mg  100 mg Oral Daily Jenne Campus, MD   100 mg at 07/19/15 9407  . traZODone (DESYREL) tablet 50 mg  50 mg Oral QHS PRN Jenne Campus, MD   50 mg at 07/18/15 2124    Lab Results: No results found for this or any previous visit (from the past 48 hour(s)).  Physical Findings: AIMS: Facial and Oral Movements Muscles of Facial Expression: None, normal Lips and Perioral Area: None, normal Jaw: None, normal Tongue: None, normal,Extremity Movements Upper (arms, wrists, hands, fingers): None, normal Lower (legs, knees, ankles, toes): None, normal, Trunk Movements Neck, shoulders, hips: None, normal, Overall Severity Severity of abnormal movements (highest score from questions above): None, normal Incapacitation due to abnormal movements: None, normal Patient's awareness of abnormal movements (rate only patient's report): No Awareness, Dental Status Current problems with teeth and/or dentures?: No Does patient usually wear dentures?: No  CIWA:  CIWA-Ar Total: 0 COWS:     Musculoskeletal: Strength & Muscle Tone: within normal limits- no tremors , no diaphoresis Gait & Station: normal Patient leans: N/A  Psychiatric Specialty Exam: ROS denies headaches, denies shortness of breath, denies nausea, denies vomiting  Blood pressure 115/74, pulse 104, temperature 97.8 F (36.6 C), temperature source Oral, resp. rate 18,  height 5' 1.25" (1.556 m), weight 150 lb (68.04 kg).Body mass index is 28.1 kg/(m^2).  General Appearance: improved grooming   Eye Contact::  Good  Speech:  Normal Rate  Volume:  Normal  Mood:   Improved mood, denies depression  Affect:  Appropriate, reactive   Thought Process:  Linear  Orientation:  Full (Time, Place, and Person)  Thought Content:  denies hallucinations, no delusions   Suicidal Thoughts:  No denies any suicidal or self injurious ideations  Homicidal Thoughts:  No  Memory:  recent and remote grossly intact  Judgement:   Improved   Insight:   Improved   Psychomotor Activity:  Normal- no tremors, no diaphoresis, no psychomotor agitation  Concentration:  Good  Recall:  Good  Fund of Knowledge:Good  Language: Good  Akathisia:  Negative  Handed:  Right  AIMS (if indicated):     Assets:  Desire for Improvement Resilience  ADL's:  Intact  Cognition: WNL  Sleep:     Assessment -  She is continuing to improve, no current WDL symptoms, mood improved and currently denies depression, presents euthymic, tolerating Zoloft well thus far .  History of seizure disorder but no recent seizures on Keppra.  At this time focused on discharge soon, plans to return home, states she is planning on continuing regular 12 step program participation, outpatient therapy, and is hopeful she will get a job soon  Treatment Plan Summary: Daily contact with patient to assess and evaluate symptoms and progress in treatment, Medication management, Plan inpatient admission and medications as below  Encourage ongoing group participation to work on coping skills and symptom reduction Treatment team working on disposition planning - consider discharge soon as she continues to improve/stabilize  Continue to encourage full abstinence from alcohol , recovery efforts Continue Ativan detox protocol to minimize risk of alcohol withdrawal  Continue  Keppra for history of Seizure disorder  Increase Zoloft  To  75 mgrs QDAY for management of anxiety and depression Continue Trazodone 50 mgrs QHS PRN for insomnia as needed  Jakiya Bookbinder 07/19/2015, 2:37 PM
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
## 2015-07-19 NOTE — Progress Notes (Signed)
Patient ID: Kristen Roberson, female   DOB: 02/04/1976, 39 y.o.   MRN: [id number]   Pt currently presents with a flat affect and plesant behavior. Per self inventory, pt rates depression at a 3, hopelessness 2 and anxiety 3. Pt's daily goal is to "focus on today and not the past." Pt reports poor sleep, a fair appetite, normal energy and good concentration. Pt reports increased pain in her wrist today, pt states "It really hurts and it's hot."   Pt provided with medications per providers orders. Pt's labs and vitals were monitored throughout the day. Pt supported emotionally and encouraged to express concerns and questions. Pt educated on medications and wound care. Pt wound assessed, no drainage noted. Given prn pain medication and ice pack.   Pt's safety ensured with 15 minute and environmental checks. Pt currently denies SI/HI and A/V hallucinations. Pt verbally agrees to seek staff if SI/HI or A/VH occurs and to consult with staff before acting on these thoughts. Pt continues to minimize problems, laughs when writer asks if she is having thoughts of hurting herself, states "not after this and all that's happening." Will continue POC.

## 2015-07-19 NOTE — B[facility] Group Notes (Signed)
[facility] LCSW Aftercare Discharge Planning Group Note  07/19/2015 8:45 AM  Participation Quality: Alert, Appropriate and Oriented  Mood/Affect: Appropriate  Depression Rating: 1  Anxiety Rating: 3  Thoughts of Suicide: Pt denies SI/HI  Will you contract for safety? Yes  Current AVH: Pt denies  Plan for Discharge/Comments: Pt attended discharge planning group and actively participated in group. CSW discussed suicide prevention education with the group and encouraged them to discuss discharge planning and any relevant barriers. Pt reports feeling "great" today and is hopeful for DC tomorrow. She reports that she will be attending an [place] meeting after discharge.  Transportation Means: Pt reports access to transportation  Supports: No supports mentioned at this time  Peri Maris, [place] 07/19/2015 10:04 AM

## 2015-07-20 MED ORDER — SERTRALINE HCL 25 MG PO TABS: 75.0000 mg | ORAL_TABLET | Freq: Every day | ORAL | Status: DC

## 2015-07-20 MED ORDER — SULFAMETHOXAZOLE-TRIMETHOPRIM 800-160 MG PO TABS
1.0000 | ORAL_TABLET | Freq: Two times a day (BID) | ORAL | Status: DC
  Filled 2015-07-20: qty 14
  Filled 2015-07-20: qty 1
  Filled 2015-07-20: qty 14

## 2015-07-20 MED ORDER — LEVETIRACETAM 1000 MG PO TABS: 1000.0000 mg | ORAL_TABLET | Freq: Two times a day (BID) | ORAL | Status: DC

## 2015-07-20 MED ORDER — TRAZODONE HCL 100 MG PO TABS: 100.0000 mg | ORAL_TABLET | Freq: Every day | ORAL | Status: DC

## 2015-07-20 MED ORDER — TRAZODONE HCL 50 MG PO TABS: 50.0000 mg | ORAL_TABLET | Freq: Every evening | ORAL | Status: DC | PRN

## 2015-07-20 MED ORDER — SULFAMETHOXAZOLE-TRIMETHOPRIM 800-160 MG PO TABS: 1.0000 | ORAL_TABLET | Freq: Two times a day (BID) | ORAL | Status: DC

## 2015-07-20 MED ORDER — BACITRACIN-NEOMYCIN-POLYMYXIN 400-5-5000 EX OINT: TOPICAL_OINTMENT | CUTANEOUS | Status: DC | PRN

## 2015-07-20 MED ORDER — TRAZODONE HCL 100 MG PO TABS
100.0000 mg | ORAL_TABLET | Freq: Every day | ORAL | Status: DC
  Filled 2015-07-20: qty 7
  Filled 2015-07-20: qty 1

## 2015-07-20 MED ORDER — CARVEDILOL 3.125 MG PO TABS: 1.6000 mg | ORAL_TABLET | Freq: Two times a day (BID) | ORAL | Status: DC

## 2015-07-20 NOTE — Discharge Summary (Signed)
Physician Discharge Summary Note  Patient:  Kristen Roberson is an 39 y.o., female MRN:  [id number] DOB:  1975-10-04 Patient phone:  [phone] (home)  Patient address:   [address] [place] [address],  Total Time spent with patient: 45 minutes  Date of Admission:  07/16/2015 Date of Discharge: 07/23/2015  Reason for Admission:   39 year old female, known to our unit from prior admissions- history of alcohol dependence, history of depression. She states " I was doing really good for a while", but felt more depressed over Christmas holidays, because " my family did not call me , I got no cards, nobody seemed to care ". Other stressors include unemployment , limited social support. States she had been sober for " a few weeks " , but due to feeling depressed and frustrated related to holidays, as above, she relapsed . Admission BAL 306. In the context of intoxication she cut her wrist, which she states was impulsive and not planned out. States " I realized that what I had done was really bad , so I wrapped a towel on wrist and called a friend, who called EMS ". States " I really do not want to die, I want to be there for my kids ". She required several sutures .  At this time she denies any suicidal ideations, and is future oriented, stating she is hoping to be discharged soon because she has an upcoming job interview .  Principal Problem: Alcohol use disorder, severe, dependence [facility]) Discharge Diagnoses: Patient Active Problem List   Diagnosis Date Noted  . Alcohol dependence with withdrawal, uncomplicated ([place]) [id number] 03/17/2015  . Hepatic encephalopathy ([place]) [K72.90] 02/23/2015  . Seizure ([place]) [R56.9] 02/23/2015  . Pancytopenia ([place]) [id number] 02/23/2015  . Internal hemorrhoids [K64.8] 02/23/2015  . Alcohol intoxication ([place]) [F10.129]   . Hemorrhoid [K64.9]   . C. difficile colitis [A04.7]   . Alcohol abuse [F10.10]   . Elevated liver enzymes [R74.8]   . Colitis  [K52.9] 12/12/2014  . Abrasion of wrist [S60.819A]   . Alcohol dependence with uncomplicated withdrawal ([place]) [F10.230]   . GAD (generalized anxiety disorder) [F41.1] 07/01/2014  . Alcohol withdrawal syndrome without complication ([place]) [id number] 06/30/2014  . MDD (major depressive disorder), recurrent episode, moderate ([place]) [F33.1]   . Alcohol use disorder, severe, dependence ([place]) [F10.20]   . Alcohol dependence with withdrawal with complication ([place]) 123456 05/19/2014  . Thrombocytopenia ([place]) [D69.6] 05/07/2014  . Hypokalemia [E87.6] 05/07/2014  . Right sided weakness [M62.89] 04/15/2014  . Seizures ([place]) [R56.9] 04/04/2014  . Substance induced mood disorder ([place]) [F19.94] 03/26/2014  . Status post myocardial infarction [I25.2] 03/06/2014  . Depression [F32.9] 03/06/2014  . Substance abuse [F19.10] 02/22/2014  . Acute respiratory failure with hypoxia ([place]) [J96.01] 02/22/2014  . Ventricular fibrillation ([place]) [I49.01] 02/22/2014  . Acute systolic heart failure - s/p VF Cardiac Arrest [I50.21] 02/22/2014  . Acute encephalopathy [G93.40] 02/22/2014  . Acute confusional state [F05] 02/22/2014  . Moderate malnutrition ([place]) [E44.0] 02/21/2014  . Convulsions/seizures ([place]) [R56.9] 02/14/2014  . Cardiac arrest ([place]) [I46.9] 02/13/2014  . Alcohol dependence ([place]) [F10.20] 01/06/2014  . Severe alcohol use disorder ([place]) [F10.20] 01/06/2014  . Adjustment disorder with depressed mood [F43.21] 12/14/2013  . 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 Folate deficiency [E53.8] 12/11/2013  . Alcoholism ([place]) [F10.20] 12/11/2013  . Alcohol withdrawal ([place]) [F10.239] 12/11/2013  . Hypokalemia [E87.6] 12/11/2013  . Malnutrition of moderate degree ([place]) [E44.0] 12/10/2013  . Abdominal pain [R10.9]  11/25/2013  . Mallory-Weiss tear [K22.6] 11/25/2013  . Acute blood loss anemia [D62] 11/24/2013  . Hematemesis [K92.0] 11/23/2013  . Fatty liver [K76.0] 10/29/2013  . Internal  hemorrhoids with other complication Q000111Q Q000111Q  . Hematochezia [K92.1] 10/28/2013  . Normocytic anemia [D64.9] 10/28/2013  . GIB (gastrointestinal bleeding) [K92.2] 10/28/2013  . Anxiety [F41.9]   . Post-operative state [Z98.890] 09/29/2013  . Postoperative state [Z98.890] 09/28/2013  . Female pelvic pain [R10.2] 09/21/2013  . Menorrhagia [N92.0] 09/21/2013  . History of ovarian cyst [Z87.42] 09/21/2013  . 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 Dysmenorrhea [N94.6] 09/21/2013    Past Psychiatric History: See H&P  Past Medical History:  Past Medical History  Diagnosis Date  . Proctitis   . Cysts of both ovaries   . Anemia   . Anxiety   . Blood transfusion without reported diagnosis   . Depression   . Fatty liver 10/05/13  . Cardiac arrest ([place])   . Seizures ([place])   . Alcohol abuse     Past Surgical History  Procedure Laterality Date  . Ovarian cyst removal    . Laparoscopy N/A 09/28/2013    Procedure: LAPAROSCOPY OPERATIVE;  Surgeon: Terrance Mass, MD;  Location: [facility];  Service: Gynecology;  Laterality: N/A;  . Laparoscopic appendectomy Right 09/28/2013    Procedure: APPENDECTOMY LAPAROSCOPIC;  Surgeon: Terrance Mass, MD;  Location: [facility];  Service: Gynecology;  Laterality: Right;  . Salpingoophorectomy Right 09/28/2013    Procedure: SALPINGO OOPHORECTOMY;  Surgeon: Terrance Mass, MD;  Location: [facility];  Service: Gynecology;  Laterality: Right;  . Colonoscopy N/A 09/30/2013    Procedure: COLONOSCOPY;  Surgeon: Lafayette Dragon, MD;  Location: [facility];  Service: Endoscopy;  Laterality: N/A;  . Esophagogastroduodenoscopy N/A 11/23/2013    Procedure: ESOPHAGOGASTRODUODENOSCOPY (EGD);  Surgeon: Jerene Bears, MD;  Location: [facility];  Service: Endoscopy;  Laterality: N/A;  . Appendectomy    . Left and right heart catheterization with coronary angiogram N/A 02/23/2014    Procedure: LEFT AND RIGHT HEART CATHETERIZATION WITH CORONARY ANGIOGRAM;  Surgeon: Leonie Man, MD;  Location: [facility];  Service: Cardiovascular;  Laterality: N/A;   Family History:  Family History  Problem Relation Age of Onset  . Diabetes Mother   . Hyperlipidemia Mother   . Stroke Mother   . Diabetes Father    Family Psychiatric  History: See H&P Social History:  History  Alcohol Use  . Yes    Comment: occaisonal; "hadn't been drinking" until time she cut herself.     History  Drug Use No    Social History   Social History  . Marital Status: Legally Separated    Spouse Name: N/A  . Number of Children: N/A  . Years of Education: N/A   Social History Main Topics  . Smoking status: Never Smoker   . Smokeless tobacco: Never Used  . Alcohol Use: Yes     Comment: occaisonal; "hadn't been drinking" until time she cut herself.  . Drug Use: No  . Sexual Activity: Not Asked   Other Topics Concern  . None   Social History Narrative    Hospital Course:   Kristen Roberson was admitted for Alcohol use disorder, severe, dependence ([place]), with psychosis and crisis management.  Pt was treated discharged with the medications listed below under Medication List.  Medical problems were identified and treated as needed.  Home medications were restarted as appropriate.   Improvement was monitored by observation and  Kristen Roberson 's daily report of symptom reduction.  Emotional and mental status was monitored by daily self-inventory reports completed by Kristen Roberson and clinical staff.        Kristen Roberson was evaluated by the treatment team for stability and plans for continued recovery upon discharge. Kristen Roberson 's motivation was an integral factor for scheduling further treatment. Employment, transportation, bed availability, health status, family support, and any pending legal issues were also considered during hospital stay. Pt was offered further treatment options upon discharge including but not limited to Residential, Intensive Outpatient, and Outpatient treatment.  Kristen Roberson will follow up with the services as listed below under Follow Up Information.     Upon completion of this admission the patient was both mentally and medically stable for discharge denying suicidal/homicidal ideation, auditory/visual/tactile hallucinations, delusional thoughts and paranoia.    Kristen Roberson responded well to treatment with Trazodone, Zoloft, Bactrim, and Keppra without adverse effects. Initially, pt did complain of fatigue with these medications, but this resolved. Pt demonstrated improvement without reported or observed adverse effects to the point of stability appropriate for outpatient management.Reviewed CBC, CMP, BAL, and UDS; all unremarkable aside from noted exceptions.   Physical Findings: AIMS: Facial and Oral Movements Muscles of Facial Expression: None, normal Lips and Perioral Area: None, normal Jaw: None, normal Tongue: None, normal,Extremity Movements Upper (arms, wrists, hands, fingers): None, normal Lower (legs, knees, ankles, toes): None, normal, Trunk Movements Neck, shoulders, hips: None, normal, Overall Severity Severity of abnormal movements (highest score from questions above): None, normal Incapacitation due to abnormal movements: None, normal Patient's awareness of abnormal movements (rate only patient's report): No Awareness, Dental Status Current problems with teeth and/or dentures?: No Does patient usually wear dentures?: No  CIWA:  CIWA-Ar Total: 0 COWS:     Musculoskeletal: Strength & Muscle Tone: within normal limits Gait & Station: normal Patient leans: N/A  Psychiatric Specialty Exam: Review of Systems  Psychiatric/Behavioral: Positive for depression. The patient is nervous/anxious and has insomnia.   All other systems reviewed and are negative.   Blood pressure 111/71, pulse 115, temperature 97.8 F (36.6 C), temperature source Oral, resp. rate 16, height 5' 1.25" (1.556 m), weight 68.04 kg (150 lb).Body mass index is 28.1  kg/(m^2).  SEE MD PSE within the [place]   Have you used any form of tobacco in the last 30 days? (Cigarettes, Smokeless Tobacco, Cigars, and/or Pipes): No  Has this patient used any form of tobacco in the last 30 days? (Cigarettes, Smokeless Tobacco, Cigars, and/or Pipes) Yes, No  Metabolic Disorder Labs:  Lab Results  Component Value Date   HGBA1C 4.4 12/09/2013   MPG 80 12/09/2013   No results found for: PROLACTIN No results found for: CHOL, TRIG, HDL, CHOLHDL, VLDL, LDLCALC  See Psychiatric Specialty Exam and Suicide Risk Assessment completed by Attending Physician prior to discharge.  Discharge destination:  Home  Is patient on multiple antipsychotic therapies at discharge:  No   Has Patient had three or more failed trials of antipsychotic monotherapy by history:  No  Recommended Plan for Multiple Antipsychotic Therapies: NA     Medication List    STOP taking these medications        calcium-vitamin D 500-200 MG-UNIT tablet  Commonly known as:  OSCAL WITH D     dicyclomine 20 MG tablet  Commonly known as:  BENTYL     hydrocortisone-pramoxine rectal foam  Commonly known as:  PROCTOFOAM HC  Ibuprofen 200 MG Caps     lactulose 10 GM/15ML solution  Commonly known as:  CHRONULAC     mirtazapine 30 MG tablet  Commonly known as:  REMERON     ondansetron 4 MG disintegrating tablet  Commonly known as:  ZOFRAN ODT     Potassium Chloride ER 20 MEQ Tbcr     ranitidine 150 MG/10ML syrup  Commonly known as:  ZANTAC     saccharomyces boulardii 250 MG capsule  Commonly known as:  FLORASTOR     sucralfate 1 GM/10ML suspension  Commonly known as:  CARAFATE      TAKE these medications      Indication   carvedilol 3.125 MG tablet  Commonly known as:  COREG  Take 0.5 tablets (1.5625 mg total) by mouth 2 (two) times daily with a meal.   Indication:  HTN     levETIRAcetam 1000 MG tablet  Commonly known as:  KEPPRA  Take 1 tablet (1,000 mg total) by mouth 2 (two)  times daily. For seizure activities   Indication:  Seizure activities     neomycin-bacitracin-polymyxin ointment  Commonly known as:  NEOSPORIN  Apply topically as needed for wound care. apply to eye and wrist   Indication:  infection     sertraline 25 MG tablet  Commonly known as:  ZOLOFT  Take 3 tablets (75 mg total) by mouth daily.   Indication:  Major Depressive Disorder     sulfamethoxazole-trimethoprim 800-160 MG tablet  Commonly known as:  BACTRIM DS,SEPTRA DS  Take 1 tablet by mouth every 12 (twelve) hours.  Start taking on:  07/21/2015   Indication:  cellulitis     traZODone 100 MG tablet  Commonly known as:  DESYREL  Take 1 tablet (100 mg total) by mouth at bedtime.   Indication:  Trouble Sleeping           Follow-up Information    Follow up with [facility]   Specialty:  Behavioral Health   Why:  Since you have not been seen since April, you will have to walk-in to be re-established with services. Walk-in hours are 8am-3pm Monday-Friday.   Contact information:   [place] [address] [phone]       Follow-up recommendations:  Activity:  As tolerated Diet:  heart healthy with low sodium  Comments:   Take all medications as prescribed. Keep all follow-up appointments as scheduled.  Do not consume alcohol or use illegal drugs while on prescription medications. Report any adverse effects from your medications to your primary care provider promptly.  In the event of recurrent symptoms or worsening symptoms, call 911, a crisis hotline, or go to the nearest emergency department for evaluation.   Signed: Benjamine Mola, FNP-BC 07/20/2015, 11:07 AM  I personally assessed the patient and formulated the plan Geralyn Flash A. Sabra Heck, M.D.
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
## 2015-07-20 NOTE — Progress Notes (Signed)
D) Pt is being discharged to home. Mood and affect are within normal limits. Pt denies SI and HI. Rates her depression at a 2, hopelessness at a 2 and her anxiety at a 3. Pt states it was "stupid what I did. I won't do it again".   A) All belongings returned to Pt. Pt provided with a 1:1. Pt given support, reassurance and praise. Encouragement provided. All medications and follow up plans explained to Pt. R) Pt denies SI and HI.

## 2015-07-20 NOTE — Progress Notes (Signed)
  [facility] Adult Case Management Discharge Plan :  Will you be returning to the same living situation after discharge:  Yes,  home in apartment At discharge, do you have transportation home?: Yes,  bus pass given Do you have the ability to pay for your medications: Yes,  gets patient assistance through [facility]  Release of information consent forms completed and in the chart;  Patient's signature needed at discharge.  Patient to Follow up at: Follow-up Information    Follow up with [facility].   Specialty:  Behavioral Health   Why:  Since you have not been seen since April, you will have to walk-in to be re-established with services. Walk-in hours are 8am-3pm Monday-Friday.   Contact information:   [place] [address] [phone]       Next level of care provider has access to [place] and Suicide Prevention discussed: Yes,  with pt and refused with collateral  Have you used any form of tobacco in the last 30 days? (Cigarettes, Smokeless Tobacco, Cigars, and/or Pipes): No  Has patient been referred to the Quitline?: Patient refused referral per CSW handoff although is a nonsmoker per info above   Patient has been referred for addiction treatment: Yes Integrated Dual Diagnosis treatment available through [facility] 07/20/2015, 1:43 PM

## 2015-07-20 NOTE — BHH Group Notes (Signed)
[place] Group Notes:  (Clinical Social Work)   07/20/2015 10:00-11:00AM  Summary of Progress/Problems: In today's process group a decisional balance exercise was used to explore in depth the perceived benefits and costs of unhealthy coping techniques, as well as the benefits and costs of replacing these with healthy coping skills. Among the unhealthy coping techniques listed by the group were drinking, isolating, overtaking pain medications, directing anger inward, trying to control everything, stopping medications, denying grief, rumination, and anger outbursts. Motivational Interviewing and the whiteboard were utilized for the exercises. The patient stated she needs to figure out how to let go of the past and forgive.  She was in and out of the room multiple times due to being in the middle of the discharge process, meeting with various practitioners.  Type of Therapy:  Group Therapy - Process   Participation Level:  Active  Participation Quality:  Attentive  Affect:  Appropriate  Cognitive:  Appropriate  Insight:  Engaged  Engagement in Therapy:  Engaged  Modes of Intervention:  Education, Motivational Interviewing  Selmer Dominion, LCSW 07/20/2015, 12:33 PM

## 2015-08-01 ENCOUNTER — Emergency Department (HOSPITAL_COMMUNITY): Payer: Self-pay

## 2015-08-01 ENCOUNTER — Encounter (HOSPITAL_COMMUNITY): Payer: Self-pay | Admitting: Nurse Practitioner

## 2015-08-01 ENCOUNTER — Emergency Department (HOSPITAL_COMMUNITY)
Admission: EM | Admit: 2015-08-01 | Discharge: 2015-08-01 | Disposition: A | Discharge status: Home / Self Care | Payer: Self-pay | Attending: Emergency Medicine | Admitting: Emergency Medicine

## 2015-08-01 DIAGNOSIS — T148XXA Other injury of unspecified body region, initial encounter: Secondary | ICD-10-CM

## 2015-08-01 DIAGNOSIS — Z880 Allergy status to penicillin: Secondary | ICD-10-CM | POA: Insufficient documentation

## 2015-08-01 DIAGNOSIS — Z9889 Other specified postprocedural states: Secondary | ICD-10-CM | POA: Insufficient documentation

## 2015-08-01 DIAGNOSIS — Z862 Personal history of diseases of the blood and blood-forming organs and certain disorders involving the immune mechanism: Secondary | ICD-10-CM | POA: Insufficient documentation

## 2015-08-01 DIAGNOSIS — S0083XA Contusion of other part of head, initial encounter: Secondary | ICD-10-CM | POA: Insufficient documentation

## 2015-08-01 DIAGNOSIS — N390 Urinary tract infection, site not specified: Secondary | ICD-10-CM

## 2015-08-01 DIAGNOSIS — F419 Anxiety disorder, unspecified: Secondary | ICD-10-CM | POA: Insufficient documentation

## 2015-08-01 DIAGNOSIS — Y9289 Other specified places as the place of occurrence of the external cause: Secondary | ICD-10-CM | POA: Insufficient documentation

## 2015-08-01 DIAGNOSIS — Z8674 Personal history of sudden cardiac arrest: Secondary | ICD-10-CM | POA: Insufficient documentation

## 2015-08-01 DIAGNOSIS — Z79899 Other long term (current) drug therapy: Secondary | ICD-10-CM | POA: Insufficient documentation

## 2015-08-01 DIAGNOSIS — S299XXA Unspecified injury of thorax, initial encounter: Secondary | ICD-10-CM | POA: Insufficient documentation

## 2015-08-01 DIAGNOSIS — A5903 Trichomonal cystitis and urethritis: Secondary | ICD-10-CM

## 2015-08-01 DIAGNOSIS — Z8742 Personal history of other diseases of the female genital tract: Secondary | ICD-10-CM | POA: Insufficient documentation

## 2015-08-01 DIAGNOSIS — Y998 Other external cause status: Secondary | ICD-10-CM | POA: Insufficient documentation

## 2015-08-01 DIAGNOSIS — S40012A Contusion of left shoulder, initial encounter: Secondary | ICD-10-CM | POA: Insufficient documentation

## 2015-08-01 DIAGNOSIS — F329 Major depressive disorder, single episode, unspecified: Secondary | ICD-10-CM | POA: Insufficient documentation

## 2015-08-01 DIAGNOSIS — Z8719 Personal history of other diseases of the digestive system: Secondary | ICD-10-CM | POA: Insufficient documentation

## 2015-08-01 DIAGNOSIS — Y9389 Activity, other specified: Secondary | ICD-10-CM | POA: Insufficient documentation

## 2015-08-01 LAB — URINE MICROSCOPIC-ADD ON

## 2015-08-01 LAB — URINALYSIS, ROUTINE W REFLEX MICROSCOPIC
Bilirubin Urine: NEGATIVE
Glucose, UA: NEGATIVE mg/dL
Ketones, ur: NEGATIVE mg/dL
NITRITE: NEGATIVE
PROTEIN: 30 mg/dL — AB
SPECIFIC GRAVITY, URINE: 1.016 (ref 1.005–1.030)
pH: 6.5 (ref 5.0–8.0)

## 2015-08-01 MED ORDER — METRONIDAZOLE 500 MG PO TABS
2000.0000 mg | ORAL_TABLET | Freq: Once | ORAL | Status: AC
  Administered 2015-08-01: 2000 mg via ORAL
  Filled 2015-08-01: qty 4

## 2015-08-01 MED ORDER — LORAZEPAM 0.5 MG PO TABS
0.5000 mg | ORAL_TABLET | Freq: Once | ORAL | Status: AC
  Administered 2015-08-01: 0.5 mg via ORAL
  Filled 2015-08-01: qty 1

## 2015-08-01 MED ORDER — SULFAMETHOXAZOLE-TRIMETHOPRIM 800-160 MG PO TABS: 1.0000 | ORAL_TABLET | Freq: Two times a day (BID) | ORAL | Status: AC

## 2015-08-01 MED ORDER — FENTANYL CITRATE (PF) 100 MCG/2ML IJ SOLN
50.0000 ug | Freq: Once | INTRAMUSCULAR | Status: AC
  Administered 2015-08-01: 50 ug via NASAL
  Filled 2015-08-01: qty 2

## 2015-08-01 MED ORDER — HYDROCODONE-ACETAMINOPHEN 5-325 MG PO TABS
1.0000 | ORAL_TABLET | Freq: Once | ORAL | Status: AC
  Administered 2015-08-01: 1 via ORAL
  Filled 2015-08-01: qty 1

## 2015-08-01 NOTE — Discharge Instructions (Signed)
Trichomoniasis Trichomoniasis is an infection caused by an organism called Trichomonas. The infection can affect both women and men. In women, the outer female genitalia and the vagina are affected. In men, the penis is mainly affected, but the prostate and other reproductive organs can also be involved. Trichomoniasis is a sexually transmitted infection (STI) and is most often passed to another person through sexual contact.  RISK FACTORS  Having unprotected sexual intercourse.  Having sexual intercourse with an infected partner. SIGNS AND SYMPTOMS  Symptoms of trichomoniasis in women include:  Abnormal gray-green frothy vaginal discharge.  Itching and irritation of the vagina.  Itching and irritation of the area outside the vagina. Symptoms of trichomoniasis in men include:   Penile discharge with or without pain.  Pain during urination. This results from inflammation of the urethra. DIAGNOSIS  Trichomoniasis may be found during a Pap test or physical exam. Your health care provider may use one of the following methods to help diagnose this infection:  Testing the pH of the vagina with a test tape.  Using a vaginal swab test that checks for the Trichomonas organism. A test is available that provides results within a few minutes.  Examining a urine sample.  Testing vaginal secretions. Your health care provider may test you for other STIs, including HIV. TREATMENT   You may be given medicine to fight the infection. Women should inform their health care provider if they could be or are pregnant. Some medicines used to treat the infection should not be taken during pregnancy.  Your health care provider may recommend over-the-counter medicines or creams to decrease itching or irritation.  Your sexual partner will need to be treated if infected.  Your health care provider may test you for infection again 3 months after treatment. HOME CARE INSTRUCTIONS   Take medicines only as  directed by your health care provider.  Take over-the-counter medicine for itching or irritation as directed by your health care provider.  Do not have sexual intercourse while you have the infection.  Women should not douche or wear tampons while they have the infection.  Discuss your infection with your partner. Your partner may have gotten the infection from you, or you may have gotten it from your partner.  Have your sex partner get examined and treated if necessary.  Practice safe, informed, and protected sex.  See your health care provider for other STI testing. SEEK MEDICAL CARE IF:   You still have symptoms after you finish your medicine.  You develop abdominal pain.  You have pain when you urinate.  You have bleeding after sexual intercourse.  You develop a rash.  Your medicine makes you sick or makes you throw up (vomit). MAKE SURE YOU:  Understand these instructions.  Will watch your condition.  Will get help right away if you are not doing well or get worse.   This information is not intended to replace advice given to you by your health care provider. Make sure you discuss any questions you have with your health care provider.   Document Released: 12/30/2000 Document Revised: 07/27/2014 Document Reviewed: 04/17/2013 Elsevier Interactive Patient Education 2016 [employer]  Urinary Tract Infection Urinary tract infections (UTIs) can develop anywhere along your urinary tract. Your urinary tract is your body's drainage system for removing wastes and extra water. Your urinary tract includes two kidneys, two ureters, a bladder, and a urethra. Your kidneys are a pair of bean-shaped organs. Each kidney is about the size of your fist. They  are located below your ribs, one on each side of your spine. CAUSES Infections are caused by microbes, which are microscopic organisms, including fungi, viruses, and bacteria. These organisms are so small that they can only be  seen through a microscope. Bacteria are the microbes that most commonly cause UTIs. SYMPTOMS  Symptoms of UTIs may vary by age and gender of the patient and by the location of the infection. Symptoms in young women typically include a frequent and intense urge to urinate and a painful, burning feeling in the bladder or urethra during urination. Older women and men are more likely to be tired, shaky, and weak and have muscle aches and abdominal pain. A fever may mean the infection is in your kidneys. Other symptoms of a kidney infection include pain in your back or sides below the ribs, nausea, and vomiting. DIAGNOSIS To diagnose a UTI, your caregiver will ask you about your symptoms. Your caregiver will also ask you to provide a urine sample. The urine sample will be tested for bacteria and white blood cells. White blood cells are made by your body to help fight infection. TREATMENT  Typically, UTIs can be treated with medication. Because most UTIs are caused by a bacterial infection, they usually can be treated with the use of antibiotics. The choice of antibiotic and length of treatment depend on your symptoms and the type of bacteria causing your infection. HOME CARE INSTRUCTIONS  If you were prescribed antibiotics, take them exactly as your caregiver instructs you. Finish the medication even if you feel better after you have only taken some of the medication.  Drink enough water and fluids to keep your urine clear or pale yellow.  Avoid caffeine, tea, and carbonated beverages. They tend to irritate your bladder.  Empty your bladder often. Avoid holding urine for long periods of time.  Empty your bladder before and after sexual intercourse.  After a bowel movement, women should cleanse from front to back. Use each tissue only once. SEEK MEDICAL CARE IF:   You have back pain.  You develop a fever.  Your symptoms do not begin to resolve within 3 days. SEEK IMMEDIATE MEDICAL CARE IF:    You have severe back pain or lower abdominal pain.  You develop chills.  You have nausea or vomiting.  You have continued burning or discomfort with urination. MAKE SURE YOU:   Understand these instructions.  Will watch your condition.  Will get help right away if you are not doing well or get worse.   This information is not intended to replace advice given to you by your health care provider. Make sure you discuss any questions you have with your health care provider.   Follow up with your primary care provider for re-evaluation. Apply ice to affected area. Take antibiotics as prescribed. Return to the emergency department if you experience severe worsening of your symptoms, pain with eye movement, blurry vision, abdominal pain, burning with urination, blood in your urine, fever.
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
## 2015-08-01 NOTE — ED Notes (Signed)
Pt given her purse and RN called Hilda Blades at [phone] per pt's request. Hilda Blades cannot come up to ED to be with pt at this time.  Pt aware.

## 2015-08-05 ENCOUNTER — Emergency Department (HOSPITAL_COMMUNITY): Payer: Self-pay

## 2015-08-05 ENCOUNTER — Emergency Department (HOSPITAL_COMMUNITY)
Admission: EM | Admit: 2015-08-05 | Discharge: 2015-08-05 | Disposition: A | Discharge status: Home / Self Care | Payer: Self-pay | Attending: Emergency Medicine | Admitting: Emergency Medicine

## 2015-08-05 ENCOUNTER — Encounter (HOSPITAL_COMMUNITY): Payer: Self-pay

## 2015-08-05 DIAGNOSIS — R000 Tachycardia, unspecified: Secondary | ICD-10-CM | POA: Insufficient documentation

## 2015-08-05 DIAGNOSIS — S63601A Unspecified sprain of right thumb, initial encounter: Secondary | ICD-10-CM | POA: Insufficient documentation

## 2015-08-05 DIAGNOSIS — Y998 Other external cause status: Secondary | ICD-10-CM | POA: Insufficient documentation

## 2015-08-05 DIAGNOSIS — W1839XA Other fall on same level, initial encounter: Secondary | ICD-10-CM | POA: Insufficient documentation

## 2015-08-05 DIAGNOSIS — R569 Unspecified convulsions: Secondary | ICD-10-CM | POA: Insufficient documentation

## 2015-08-05 DIAGNOSIS — N390 Urinary tract infection, site not specified: Secondary | ICD-10-CM | POA: Insufficient documentation

## 2015-08-05 DIAGNOSIS — Z880 Allergy status to penicillin: Secondary | ICD-10-CM | POA: Insufficient documentation

## 2015-08-05 DIAGNOSIS — F10120 Alcohol abuse with intoxication, uncomplicated: Secondary | ICD-10-CM | POA: Insufficient documentation

## 2015-08-05 DIAGNOSIS — Y9289 Other specified places as the place of occurrence of the external cause: Secondary | ICD-10-CM | POA: Insufficient documentation

## 2015-08-05 DIAGNOSIS — R109 Unspecified abdominal pain: Secondary | ICD-10-CM

## 2015-08-05 DIAGNOSIS — F419 Anxiety disorder, unspecified: Secondary | ICD-10-CM | POA: Insufficient documentation

## 2015-08-05 DIAGNOSIS — F329 Major depressive disorder, single episode, unspecified: Secondary | ICD-10-CM | POA: Insufficient documentation

## 2015-08-05 DIAGNOSIS — G8929 Other chronic pain: Secondary | ICD-10-CM | POA: Insufficient documentation

## 2015-08-05 DIAGNOSIS — S0012XA Contusion of left eyelid and periocular area, initial encounter: Secondary | ICD-10-CM | POA: Insufficient documentation

## 2015-08-05 DIAGNOSIS — F10920 Alcohol use, unspecified with intoxication, uncomplicated: Secondary | ICD-10-CM

## 2015-08-05 DIAGNOSIS — Z862 Personal history of diseases of the blood and blood-forming organs and certain disorders involving the immune mechanism: Secondary | ICD-10-CM | POA: Insufficient documentation

## 2015-08-05 DIAGNOSIS — Y9389 Activity, other specified: Secondary | ICD-10-CM | POA: Insufficient documentation

## 2015-08-05 LAB — URINE MICROSCOPIC-ADD ON

## 2015-08-05 LAB — ETHANOL: Alcohol, Ethyl (B): 398 mg/dL (ref <5)

## 2015-08-05 LAB — URINALYSIS, ROUTINE W REFLEX MICROSCOPIC
Glucose, UA: NEGATIVE mg/dL
KETONES UR: 15 mg/dL — AB
NITRITE: POSITIVE — AB
PH: 5.5 (ref 5.0–8.0)
Protein, ur: 100 mg/dL — AB
Specific Gravity, Urine: 1.027 (ref 1.005–1.030)

## 2015-08-05 LAB — COMPREHENSIVE METABOLIC PANEL
ALT: 31 U/L (ref 14–54)
ANION GAP: 14 (ref 5–15)
AST: 87 U/L — AB (ref 15–41)
Albumin: 4.9 g/dL (ref 3.5–5.0)
Alkaline Phosphatase: 150 U/L — ABNORMAL HIGH (ref 38–126)
BUN: 9 mg/dL (ref 6–20)
CO2: 21 mmol/L — ABNORMAL LOW (ref 22–32)
Calcium: 9.3 mg/dL (ref 8.9–10.3)
Chloride: 107 mmol/L (ref 101–111)
Creatinine, Ser: 0.57 mg/dL (ref 0.44–1.00)
GFR calc non Af Amer: >60 mL/min (ref >60)
Glucose, Bld: 124 mg/dL — ABNORMAL HIGH (ref 65–99)
POTASSIUM: 3.4 mmol/L — AB (ref 3.5–5.1)
SODIUM: 142 mmol/L (ref 135–145)
TOTAL PROTEIN: 8.4 g/dL — AB (ref 6.5–8.1)
Total Bilirubin: 0.5 mg/dL (ref 0.3–1.2)

## 2015-08-05 LAB — CBC WITH DIFFERENTIAL/PLATELET
BASOS PCT: 1 %
Basophils Absolute: 0 10*3/uL (ref 0.0–0.1)
EOS ABS: 0 10*3/uL (ref 0.0–0.7)
EOS PCT: 0 %
HEMATOCRIT: 32.1 % — AB (ref 36.0–46.0)
Hemoglobin: 10 g/dL — ABNORMAL LOW (ref 12.0–15.0)
Lymphocytes Relative: 35 %
Lymphs Abs: 1.2 10*3/uL (ref 0.7–4.0)
MCH: 26.2 pg (ref 26.0–34.0)
MCHC: 31.2 g/dL (ref 30.0–36.0)
MCV: 84.3 fL (ref 78.0–100.0)
MONO ABS: 0.3 10*3/uL (ref 0.1–1.0)
MONOS PCT: 10 %
NEUTROS ABS: 1.8 10*3/uL (ref 1.7–7.7)
Neutrophils Relative %: 54 %
PLATELETS: 207 10*3/uL (ref 150–400)
RBC: 3.81 MIL/uL — ABNORMAL LOW (ref 3.87–5.11)
RDW: 20.2 % — AB (ref 11.5–15.5)
WBC: 3.4 10*3/uL — ABNORMAL LOW (ref 4.0–10.5)

## 2015-08-05 LAB — RAPID URINE DRUG SCREEN, HOSP PERFORMED
AMPHETAMINES: NOT DETECTED
Barbiturates: NOT DETECTED
Benzodiazepines: NOT DETECTED
COCAINE: NOT DETECTED
OPIATES: NOT DETECTED
Tetrahydrocannabinol: NOT DETECTED

## 2015-08-05 LAB — LIPASE, BLOOD: LIPASE: 36 U/L (ref 11–51)

## 2015-08-05 MED ORDER — LORAZEPAM 1 MG PO TABS
1.0000 mg | ORAL_TABLET | Freq: Once | ORAL | Status: AC
  Administered 2015-08-05: 1 mg via ORAL
  Filled 2015-08-05: qty 1

## 2015-08-05 MED ORDER — KETOROLAC TROMETHAMINE 30 MG/ML IJ SOLN
30.0000 mg | Freq: Once | INTRAMUSCULAR | Status: AC
  Administered 2015-08-05: 30 mg via INTRAVENOUS
  Filled 2015-08-05: qty 1

## 2015-08-05 MED ORDER — SODIUM CHLORIDE 0.9 % IV BOLUS (SEPSIS)
1000.0000 mL | Freq: Once | INTRAVENOUS | Status: AC
  Administered 2015-08-05: 1000 mL via INTRAVENOUS

## 2015-08-05 MED ORDER — ONDANSETRON HCL 4 MG/2ML IJ SOLN
4.0000 mg | Freq: Once | INTRAMUSCULAR | Status: AC
  Administered 2015-08-05: 4 mg via INTRAVENOUS
  Filled 2015-08-05: qty 2

## 2015-08-05 MED ORDER — CEPHALEXIN 500 MG PO CAPS: 500.0000 mg | ORAL_CAPSULE | Freq: Four times a day (QID) | ORAL | Status: DC
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
## 2015-08-05 NOTE — ED Notes (Signed)
Patient transported to X-ray 

## 2015-08-07 LAB — URINE CULTURE

## 2015-08-16 ENCOUNTER — Emergency Department (HOSPITAL_COMMUNITY): Payer: Self-pay

## 2015-08-16 ENCOUNTER — Emergency Department (HOSPITAL_COMMUNITY)
Admission: EM | Admit: 2015-08-16 | Discharge: 2015-08-16 | Discharge status: Against Medical Advice | Payer: Self-pay | Attending: Emergency Medicine | Admitting: Emergency Medicine

## 2015-08-16 ENCOUNTER — Encounter (HOSPITAL_COMMUNITY): Payer: Self-pay

## 2015-08-16 DIAGNOSIS — S29001A Unspecified injury of muscle and tendon of front wall of thorax, initial encounter: Secondary | ICD-10-CM | POA: Insufficient documentation

## 2015-08-16 DIAGNOSIS — Z880 Allergy status to penicillin: Secondary | ICD-10-CM | POA: Insufficient documentation

## 2015-08-16 DIAGNOSIS — Z8674 Personal history of sudden cardiac arrest: Secondary | ICD-10-CM | POA: Insufficient documentation

## 2015-08-16 DIAGNOSIS — Y9289 Other specified places as the place of occurrence of the external cause: Secondary | ICD-10-CM | POA: Insufficient documentation

## 2015-08-16 DIAGNOSIS — Z3202 Encounter for pregnancy test, result negative: Secondary | ICD-10-CM | POA: Insufficient documentation

## 2015-08-16 DIAGNOSIS — Y998 Other external cause status: Secondary | ICD-10-CM | POA: Insufficient documentation

## 2015-08-16 DIAGNOSIS — S4991XA Unspecified injury of right shoulder and upper arm, initial encounter: Secondary | ICD-10-CM | POA: Insufficient documentation

## 2015-08-16 DIAGNOSIS — Y9389 Activity, other specified: Secondary | ICD-10-CM | POA: Insufficient documentation

## 2015-08-16 DIAGNOSIS — Z79899 Other long term (current) drug therapy: Secondary | ICD-10-CM | POA: Insufficient documentation

## 2015-08-16 DIAGNOSIS — S6991XA Unspecified injury of right wrist, hand and finger(s), initial encounter: Secondary | ICD-10-CM | POA: Insufficient documentation

## 2015-08-16 DIAGNOSIS — S01511A Laceration without foreign body of lip, initial encounter: Secondary | ICD-10-CM | POA: Insufficient documentation

## 2015-08-16 DIAGNOSIS — Z9889 Other specified postprocedural states: Secondary | ICD-10-CM | POA: Insufficient documentation

## 2015-08-16 DIAGNOSIS — Z8742 Personal history of other diseases of the female genital tract: Secondary | ICD-10-CM | POA: Insufficient documentation

## 2015-08-16 DIAGNOSIS — S0990XA Unspecified injury of head, initial encounter: Secondary | ICD-10-CM | POA: Insufficient documentation

## 2015-08-16 DIAGNOSIS — Z862 Personal history of diseases of the blood and blood-forming organs and certain disorders involving the immune mechanism: Secondary | ICD-10-CM | POA: Insufficient documentation

## 2015-08-16 DIAGNOSIS — Z8719 Personal history of other diseases of the digestive system: Secondary | ICD-10-CM | POA: Insufficient documentation

## 2015-08-16 DIAGNOSIS — F419 Anxiety disorder, unspecified: Secondary | ICD-10-CM | POA: Insufficient documentation

## 2015-08-16 DIAGNOSIS — F329 Major depressive disorder, single episode, unspecified: Secondary | ICD-10-CM | POA: Insufficient documentation

## 2015-08-16 LAB — COMPREHENSIVE METABOLIC PANEL
ALBUMIN: 4.4 g/dL (ref 3.5–5.0)
ALK PHOS: 132 U/L — AB (ref 38–126)
ALT: 57 U/L — AB (ref 14–54)
AST: 123 U/L — AB (ref 15–41)
Anion gap: 15 (ref 5–15)
BILIRUBIN TOTAL: 0.5 mg/dL (ref 0.3–1.2)
BUN: 9 mg/dL (ref 6–20)
CALCIUM: 8.7 mg/dL — AB (ref 8.9–10.3)
CO2: 27 mmol/L (ref 22–32)
Chloride: 99 mmol/L — ABNORMAL LOW (ref 101–111)
Creatinine, Ser: 0.52 mg/dL (ref 0.44–1.00)
GFR calc Af Amer: >60 mL/min (ref >60)
GFR calc non Af Amer: >60 mL/min (ref >60)
GLUCOSE: 109 mg/dL — AB (ref 65–99)
Potassium: 3.4 mmol/L — ABNORMAL LOW (ref 3.5–5.1)
Sodium: 141 mmol/L (ref 135–145)
TOTAL PROTEIN: 7.8 g/dL (ref 6.5–8.1)

## 2015-08-16 LAB — I-STAT BETA HCG BLOOD, ED (MC, WL, AP ONLY): I-stat hCG, quantitative: <5 m[IU]/mL (ref <5)

## 2015-08-16 LAB — CBC WITH DIFFERENTIAL/PLATELET
BASOS ABS: 0 10*3/uL (ref 0.0–0.1)
Basophils Relative: 1 %
Eosinophils Absolute: 0 10*3/uL (ref 0.0–0.7)
Eosinophils Relative: 1 %
HEMATOCRIT: 29.7 % — AB (ref 36.0–46.0)
HEMOGLOBIN: 9.4 g/dL — AB (ref 12.0–15.0)
LYMPHS PCT: 36 %
Lymphs Abs: 0.8 10*3/uL (ref 0.7–4.0)
MCH: 26.6 pg (ref 26.0–34.0)
MCHC: 31.6 g/dL (ref 30.0–36.0)
MCV: 83.9 fL (ref 78.0–100.0)
MONOS PCT: 11 %
Monocytes Absolute: 0.2 10*3/uL (ref 0.1–1.0)
Neutro Abs: 1.2 10*3/uL — ABNORMAL LOW (ref 1.7–7.7)
Neutrophils Relative %: 51 %
Platelets: 147 10*3/uL — ABNORMAL LOW (ref 150–400)
RBC: 3.54 MIL/uL — AB (ref 3.87–5.11)
RDW: 21.5 % — ABNORMAL HIGH (ref 11.5–15.5)
WBC: 2.2 10*3/uL — AB (ref 4.0–10.5)

## 2015-08-16 LAB — ETHANOL: Alcohol, Ethyl (B): 327 mg/dL (ref <5)

## 2015-08-16 LAB — CBG MONITORING, ED: GLUCOSE-CAPILLARY: 102 mg/dL — AB (ref 65–99)

## 2015-08-16 MED ORDER — HYDROCODONE-ACETAMINOPHEN 5-325 MG PO TABS: 2.0000 | ORAL_TABLET | Freq: Once | ORAL | Status: DC

## 2015-08-16 MED ORDER — SODIUM CHLORIDE 0.9 % IV BOLUS (SEPSIS)
1000.0000 mL | Freq: Once | INTRAVENOUS | Status: AC
  Administered 2015-08-16: 1000 mL via INTRAVENOUS

## 2015-08-16 NOTE — ED Notes (Signed)
Pt became upset with this RN and PA when she was informed that her ETOH level was too high for safe administration of narcotic pain medication. Pt sts "Then I will sign out AMA if you cant help me with the pain." This RN explained the safety concerns of narcotics with alcohol. Pt sts she understands but still wants to leave AMA. Provider at bedside for conversation.
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
## 2015-08-16 NOTE — ED Notes (Signed)
EDP aware of ETOH.

## 2015-08-18 ENCOUNTER — Emergency Department (HOSPITAL_COMMUNITY)
Admission: EM | Admit: 2015-08-18 | Discharge: 2015-08-19 | Disposition: A | Discharge status: Home / Self Care | Payer: Self-pay | Attending: Emergency Medicine | Admitting: Emergency Medicine

## 2015-08-18 ENCOUNTER — Encounter (HOSPITAL_COMMUNITY): Payer: Self-pay | Admitting: Emergency Medicine

## 2015-08-18 DIAGNOSIS — Z8674 Personal history of sudden cardiac arrest: Secondary | ICD-10-CM | POA: Insufficient documentation

## 2015-08-18 DIAGNOSIS — F329 Major depressive disorder, single episode, unspecified: Secondary | ICD-10-CM | POA: Insufficient documentation

## 2015-08-18 DIAGNOSIS — Y998 Other external cause status: Secondary | ICD-10-CM | POA: Insufficient documentation

## 2015-08-18 DIAGNOSIS — R4182 Altered mental status, unspecified: Secondary | ICD-10-CM | POA: Insufficient documentation

## 2015-08-18 DIAGNOSIS — Z862 Personal history of diseases of the blood and blood-forming organs and certain disorders involving the immune mechanism: Secondary | ICD-10-CM | POA: Insufficient documentation

## 2015-08-18 DIAGNOSIS — F1010 Alcohol abuse, uncomplicated: Secondary | ICD-10-CM | POA: Insufficient documentation

## 2015-08-18 DIAGNOSIS — Y9289 Other specified places as the place of occurrence of the external cause: Secondary | ICD-10-CM | POA: Insufficient documentation

## 2015-08-18 DIAGNOSIS — Z880 Allergy status to penicillin: Secondary | ICD-10-CM | POA: Insufficient documentation

## 2015-08-18 DIAGNOSIS — S60811A Abrasion of right wrist, initial encounter: Secondary | ICD-10-CM | POA: Insufficient documentation

## 2015-08-18 DIAGNOSIS — X838XXA Intentional self-harm by other specified means, initial encounter: Secondary | ICD-10-CM | POA: Insufficient documentation

## 2015-08-18 DIAGNOSIS — Z8719 Personal history of other diseases of the digestive system: Secondary | ICD-10-CM | POA: Insufficient documentation

## 2015-08-18 DIAGNOSIS — Z23 Encounter for immunization: Secondary | ICD-10-CM | POA: Insufficient documentation

## 2015-08-18 DIAGNOSIS — Z8742 Personal history of other diseases of the female genital tract: Secondary | ICD-10-CM | POA: Insufficient documentation

## 2015-08-18 DIAGNOSIS — Z3202 Encounter for pregnancy test, result negative: Secondary | ICD-10-CM | POA: Insufficient documentation

## 2015-08-18 DIAGNOSIS — Y9389 Activity, other specified: Secondary | ICD-10-CM | POA: Insufficient documentation

## 2015-08-18 DIAGNOSIS — Z79899 Other long term (current) drug therapy: Secondary | ICD-10-CM | POA: Insufficient documentation

## 2015-08-18 DIAGNOSIS — Z9889 Other specified postprocedural states: Secondary | ICD-10-CM | POA: Insufficient documentation

## 2015-08-18 MED ORDER — TETANUS-DIPHTH-ACELL PERTUSSIS 5-2.5-18.5 LF-MCG/0.5 IM SUSP
0.5000 mL | Freq: Once | INTRAMUSCULAR | Status: AC
  Administered 2015-08-19: 0.5 mL via INTRAMUSCULAR
  Filled 2015-08-18: qty 0.5

## 2015-08-18 MED ORDER — BACITRACIN ZINC 500 UNIT/GM EX OINT
TOPICAL_OINTMENT | Freq: Two times a day (BID) | CUTANEOUS | Status: DC
  Administered 2015-08-19: via TOPICAL
  Filled 2015-08-18: qty 0.9
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
## 2015-08-19 ENCOUNTER — Encounter (HOSPITAL_COMMUNITY): Payer: Self-pay | Admitting: Emergency Medicine

## 2015-08-19 ENCOUNTER — Observation Stay (HOSPITAL_COMMUNITY)
Admission: AD | Admit: 2015-08-19 | Payer: Federal, State, Local not specified - Other | Source: Intra-hospital | Admitting: Psychiatry

## 2015-08-19 LAB — CBC WITH DIFFERENTIAL/PLATELET
BASOS ABS: 0 10*3/uL (ref 0.0–0.1)
BASOS PCT: 1 %
Eosinophils Absolute: 0 10*3/uL (ref 0.0–0.7)
Eosinophils Relative: 1 %
HEMATOCRIT: 25.4 % — AB (ref 36.0–46.0)
Hemoglobin: 8.1 g/dL — ABNORMAL LOW (ref 12.0–15.0)
LYMPHS PCT: 37 %
Lymphs Abs: 0.9 10*3/uL (ref 0.7–4.0)
MCH: 26.5 pg (ref 26.0–34.0)
MCHC: 31.9 g/dL (ref 30.0–36.0)
MCV: 83 fL (ref 78.0–100.0)
MONO ABS: 0.3 10*3/uL (ref 0.1–1.0)
Monocytes Relative: 14 %
NEUTROS ABS: 1.1 10*3/uL — AB (ref 1.7–7.7)
NEUTROS PCT: 47 %
PLATELETS: 205 10*3/uL (ref 150–400)
RBC: 3.06 MIL/uL — AB (ref 3.87–5.11)
RDW: 21.4 % — AB (ref 11.5–15.5)
WBC: 2.4 10*3/uL — AB (ref 4.0–10.5)

## 2015-08-19 LAB — ETHANOL
ALCOHOL ETHYL (B): 377 mg/dL — AB (ref <5)
Alcohol, Ethyl (B): 363 mg/dL (ref <5)

## 2015-08-19 LAB — RAPID URINE DRUG SCREEN, HOSP PERFORMED
Amphetamines: NOT DETECTED
Barbiturates: NOT DETECTED
Benzodiazepines: NOT DETECTED
COCAINE: NOT DETECTED
OPIATES: NOT DETECTED
Tetrahydrocannabinol: NOT DETECTED

## 2015-08-19 LAB — I-STAT BETA HCG BLOOD, ED (MC, WL, AP ONLY)

## 2015-08-19 LAB — I-STAT CHEM 8, ED
BUN: 4 mg/dL — ABNORMAL LOW (ref 6–20)
CHLORIDE: 102 mmol/L (ref 101–111)
CREATININE: 0.7 mg/dL (ref 0.44–1.00)
Calcium, Ion: 0.99 mmol/L — ABNORMAL LOW (ref 1.12–1.23)
Glucose, Bld: 114 mg/dL — ABNORMAL HIGH (ref 65–99)
HEMATOCRIT: 28 % — AB (ref 36.0–46.0)
HEMOGLOBIN: 9.5 g/dL — AB (ref 12.0–15.0)
POTASSIUM: 3.6 mmol/L (ref 3.5–5.1)
Sodium: 142 mmol/L (ref 135–145)
TCO2: 25 mmol/L (ref 0–100)
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
## 2015-08-19 NOTE — ED Notes (Signed)
Per lab pts etoh has not decreased appropriate amount in 8 hours, writer approached patient and noted strong odor as well as red spill on blanket. Attempts made to change blanket and open can could be seen in patients purse under blanket. Pt attempted to grab purse from writer, 2 cans removed from purse, "BOSS" can with 14% alcohol. One can open and empty the other sealed. MD and security notified, pt approached again with Ofr. Tacy Dura, pt states "just get rid of it" Full can opened and poured down sink drain, Tacy Dura witnessed.

## 2015-08-19 NOTE — BHH Counselor (Signed)
Psych disposition: Per Arlester Marker, NP, Pt meets criteria for [place] unit admission.   Per Lavell Luster, AC, Pt unable to come to Obs until morning shift though, as pt is not yet medically cleared due to elevated BAL -- BAL must be under 200 for admission to Obs unit. (OK to send pt to [facility]/Psych ED while awaiting Obs placement, however)  TTS Counselor informed Dr Randal Buba of disposition.   - Ramond Dial, [place]    Ext [phone]

## 2015-08-19 NOTE — ED Notes (Signed)
Patient ambulatory without assist to the restroom

## 2015-08-19 NOTE — Discharge Instructions (Signed)

## 2015-09-04 ENCOUNTER — Encounter (HOSPITAL_COMMUNITY): Payer: Self-pay | Admitting: Vascular Surgery

## 2015-09-04 ENCOUNTER — Emergency Department (HOSPITAL_COMMUNITY)
Admission: EM | Admit: 2015-09-04 | Discharge: 2015-09-04 | Disposition: A | Discharge status: Home / Self Care | Payer: Self-pay | Attending: Emergency Medicine | Admitting: Emergency Medicine

## 2015-09-04 DIAGNOSIS — S0990XA Unspecified injury of head, initial encounter: Secondary | ICD-10-CM | POA: Insufficient documentation

## 2015-09-04 DIAGNOSIS — Z8659 Personal history of other mental and behavioral disorders: Secondary | ICD-10-CM | POA: Insufficient documentation

## 2015-09-04 DIAGNOSIS — Z862 Personal history of diseases of the blood and blood-forming organs and certain disorders involving the immune mechanism: Secondary | ICD-10-CM | POA: Insufficient documentation

## 2015-09-04 DIAGNOSIS — Y9289 Other specified places as the place of occurrence of the external cause: Secondary | ICD-10-CM | POA: Insufficient documentation

## 2015-09-04 DIAGNOSIS — Z8742 Personal history of other diseases of the female genital tract: Secondary | ICD-10-CM | POA: Insufficient documentation

## 2015-09-04 DIAGNOSIS — Y9389 Activity, other specified: Secondary | ICD-10-CM | POA: Insufficient documentation

## 2015-09-04 DIAGNOSIS — Z8719 Personal history of other diseases of the digestive system: Secondary | ICD-10-CM | POA: Insufficient documentation

## 2015-09-04 DIAGNOSIS — Y998 Other external cause status: Secondary | ICD-10-CM | POA: Insufficient documentation

## 2015-09-04 DIAGNOSIS — Z880 Allergy status to penicillin: Secondary | ICD-10-CM | POA: Insufficient documentation

## 2015-09-04 DIAGNOSIS — S01452A Open bite of left cheek and temporomandibular area, initial encounter: Secondary | ICD-10-CM | POA: Insufficient documentation

## 2015-09-04 DIAGNOSIS — W503XXA Accidental bite by another person, initial encounter: Secondary | ICD-10-CM

## 2015-09-04 DIAGNOSIS — Z79899 Other long term (current) drug therapy: Secondary | ICD-10-CM | POA: Insufficient documentation

## 2015-09-04 DIAGNOSIS — Z9889 Other specified postprocedural states: Secondary | ICD-10-CM | POA: Insufficient documentation

## 2015-09-04 MED ORDER — CLINDAMYCIN HCL 150 MG PO CAPS: 150.0000 mg | ORAL_CAPSULE | Freq: Four times a day (QID) | ORAL | Status: DC

## 2015-09-04 MED ORDER — HYDROXYZINE HCL 25 MG PO TABS
25.0000 mg | ORAL_TABLET | Freq: Once | ORAL | Status: AC
  Administered 2015-09-04: 25 mg via ORAL
  Filled 2015-09-04: qty 1

## 2015-09-04 MED ORDER — HYDROCODONE-ACETAMINOPHEN 5-325 MG PO TABS
1.0000 | ORAL_TABLET | Freq: Once | ORAL | Status: AC
  Administered 2015-09-04: 1 via ORAL
  Filled 2015-09-04: qty 1

## 2015-09-04 MED ORDER — HYDROXYZINE HCL 25 MG PO TABS: 25.0000 mg | ORAL_TABLET | Freq: Three times a day (TID) | ORAL | Status: DC | PRN
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
## 2015-09-04 NOTE — ED Notes (Signed)
This RN spoke with the patient at length about her current situation. Pt stated she is going home and feels safe going there as her significant other is "in jail and will be there for a while".
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
## 2015-09-07 ENCOUNTER — Emergency Department (HOSPITAL_COMMUNITY)
Admission: EM | Admit: 2015-09-07 | Discharge: 2015-09-07 | Disposition: A | Discharge status: Home / Self Care | Payer: Self-pay | Attending: Emergency Medicine | Admitting: Emergency Medicine

## 2015-09-07 ENCOUNTER — Encounter (HOSPITAL_COMMUNITY): Payer: Self-pay

## 2015-09-07 DIAGNOSIS — Z880 Allergy status to penicillin: Secondary | ICD-10-CM | POA: Insufficient documentation

## 2015-09-07 DIAGNOSIS — R1030 Lower abdominal pain, unspecified: Secondary | ICD-10-CM

## 2015-09-07 DIAGNOSIS — F329 Major depressive disorder, single episode, unspecified: Secondary | ICD-10-CM | POA: Insufficient documentation

## 2015-09-07 DIAGNOSIS — F10120 Alcohol abuse with intoxication, uncomplicated: Secondary | ICD-10-CM | POA: Insufficient documentation

## 2015-09-07 DIAGNOSIS — F10920 Alcohol use, unspecified with intoxication, uncomplicated: Secondary | ICD-10-CM

## 2015-09-07 DIAGNOSIS — Z792 Long term (current) use of antibiotics: Secondary | ICD-10-CM | POA: Insufficient documentation

## 2015-09-07 DIAGNOSIS — Z9049 Acquired absence of other specified parts of digestive tract: Secondary | ICD-10-CM | POA: Insufficient documentation

## 2015-09-07 DIAGNOSIS — Z79899 Other long term (current) drug therapy: Secondary | ICD-10-CM | POA: Insufficient documentation

## 2015-09-07 DIAGNOSIS — Z3202 Encounter for pregnancy test, result negative: Secondary | ICD-10-CM | POA: Insufficient documentation

## 2015-09-07 DIAGNOSIS — R1084 Generalized abdominal pain: Secondary | ICD-10-CM | POA: Insufficient documentation

## 2015-09-07 DIAGNOSIS — Z8679 Personal history of other diseases of the circulatory system: Secondary | ICD-10-CM | POA: Insufficient documentation

## 2015-09-07 DIAGNOSIS — F419 Anxiety disorder, unspecified: Secondary | ICD-10-CM | POA: Insufficient documentation

## 2015-09-07 DIAGNOSIS — Z8719 Personal history of other diseases of the digestive system: Secondary | ICD-10-CM | POA: Insufficient documentation

## 2015-09-07 DIAGNOSIS — Z8742 Personal history of other diseases of the female genital tract: Secondary | ICD-10-CM | POA: Insufficient documentation

## 2015-09-07 DIAGNOSIS — Z862 Personal history of diseases of the blood and blood-forming organs and certain disorders involving the immune mechanism: Secondary | ICD-10-CM | POA: Insufficient documentation

## 2015-09-07 LAB — CBC WITH DIFFERENTIAL/PLATELET
Basophils Absolute: 0 10*3/uL (ref 0.0–0.1)
Basophils Relative: 2 %
Eosinophils Absolute: 0 10*3/uL (ref 0.0–0.7)
Eosinophils Relative: 0 %
HCT: 28.5 % — ABNORMAL LOW (ref 36.0–46.0)
Hemoglobin: 8.9 g/dL — ABNORMAL LOW (ref 12.0–15.0)
Lymphocytes Relative: 45 %
Lymphs Abs: 1.1 10*3/uL (ref 0.7–4.0)
MCH: 25.1 pg — ABNORMAL LOW (ref 26.0–34.0)
MCHC: 31.2 g/dL (ref 30.0–36.0)
MCV: 80.3 fL (ref 78.0–100.0)
Monocytes Absolute: 0.2 10*3/uL (ref 0.1–1.0)
Monocytes Relative: 7 %
Neutro Abs: 1.2 10*3/uL — ABNORMAL LOW (ref 1.7–7.7)
Neutrophils Relative %: 47 %
Platelets: 166 10*3/uL (ref 150–400)
RBC: 3.55 MIL/uL — ABNORMAL LOW (ref 3.87–5.11)
RDW: 22.9 % — ABNORMAL HIGH (ref 11.5–15.5)
WBC: 2.6 10*3/uL — ABNORMAL LOW (ref 4.0–10.5)

## 2015-09-07 LAB — COMPREHENSIVE METABOLIC PANEL
ALT: 41 U/L (ref 14–54)
AST: 62 U/L — ABNORMAL HIGH (ref 15–41)
Albumin: 4.5 g/dL (ref 3.5–5.0)
Alkaline Phosphatase: 119 U/L (ref 38–126)
Anion gap: 12 (ref 5–15)
BUN: 8 mg/dL (ref 6–20)
CO2: 23 mmol/L (ref 22–32)
Calcium: 8.3 mg/dL — ABNORMAL LOW (ref 8.9–10.3)
Chloride: 106 mmol/L (ref 101–111)
Creatinine, Ser: 0.46 mg/dL (ref 0.44–1.00)
GFR calc Af Amer: >60 mL/min (ref >60)
GFR calc non Af Amer: >60 mL/min (ref >60)
Glucose, Bld: 98 mg/dL (ref 65–99)
Potassium: 3.2 mmol/L — ABNORMAL LOW (ref 3.5–5.1)
Sodium: 141 mmol/L (ref 135–145)
Total Bilirubin: 0.5 mg/dL (ref 0.3–1.2)
Total Protein: 8 g/dL (ref 6.5–8.1)

## 2015-09-07 LAB — I-STAT BETA HCG BLOOD, ED (MC, WL, AP ONLY): I-stat hCG, quantitative: <5 m[IU]/mL (ref <5)

## 2015-09-07 LAB — LIPASE, BLOOD: Lipase: 24 U/L (ref 11–51)

## 2015-09-07 LAB — ETHANOL: Alcohol, Ethyl (B): 402 mg/dL (ref <5)

## 2015-09-07 MED ORDER — GI COCKTAIL ~~LOC~~
30.0000 mL | Freq: Once | ORAL | Status: AC
  Administered 2015-09-07: 30 mL via ORAL
  Filled 2015-09-07: qty 30

## 2015-09-07 MED ORDER — POTASSIUM CHLORIDE CRYS ER 20 MEQ PO TBCR
40.0000 meq | EXTENDED_RELEASE_TABLET | Freq: Once | ORAL | Status: DC
  Filled 2015-09-07: qty 2
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
## 2015-09-07 NOTE — Discharge Instructions (Signed)
°Emergency Department Resource Guide °1) Find a Doctor and Pay Out of Pocket °Although you won't have to find out who is covered by your insurance plan, it is a good idea to ask around and get recommendations. You will then need to call the office and see if the doctor you have chosen will accept you as a new patient and what types of options they offer for patients who are self-pay. Some doctors offer discounts or will set up payment plans for their patients who do not have insurance, but you will need to ask so you aren't surprised when you get to your appointment. ° °2) Contact Your Local Health Department °Not all health departments have doctors that can see patients for sick visits, but many do, so it is worth a call to see if yours does. If you don't know where your local health department is, you can check in your phone book. The CDC also has a tool to help you locate your state's health department, and many state websites also have listings of all of their local health departments. ° °3) Find a Walk-in Clinic °If your illness is not likely to be very severe or complicated, you may want to try a walk in clinic. These are popping up all over the country in pharmacies, drugstores, and shopping centers. They're usually staffed by nurse practitioners or physician assistants that have been trained to treat common illnesses and complaints. They're usually fairly quick and inexpensive. However, if you have serious medical issues or chronic medical problems, these are probably not your best option. ° °No Primary Care Doctor: °- Call [facility] at  832-8000 - they can help you locate a primary care doctor that  accepts your insurance, provides certain services, etc. °- Physician Referral Service- 1-800-533-3463 ° °Chronic Pain Problems: °Organization         Address  Phone   Notes  °[facility]  (336) [phone] Patients need to be referred by their primary care doctor.  ° °Medication  Assistance: °Organization         Address  Phone   Notes  °[place] [facility] [address] °[place], [place] [address] (336) 641-8030 --Must be a resident of [place] °-- Must have NO insurance coverage whatsoever (no Medicaid/ Medicare, etc.) °-- The pt. MUST have a primary care doctor that directs their care regularly and follows them in the community °  °[facility]  (866) 331-1348   °[employer]  (888) 892-1162   ° °Agencies that provide inexpensive medical care: °Organization         Address  Phone   Notes  °[facility]  (336) 832-8035   °[facility]    (336) 832-7272   °[facility] [address] °[place], [place] [address] (336) 832-4777   °[facility] [address], °[place] (336) 271-4999   °[facility]    (336) 373-0678   °[facility]    (336) 272-1050   °[facility] ° [address], [place] Phone:  (336) 832-4444, Fax:  (336) 832-4440 Hours of Operation:  9 am - 6 pm, M-F.  Also accepts Medicaid/Medicare and self-pay.  °[facility] ° [address], [place] Phone: (336) 832-3150, Fax: (336) 832-3151. Hours of Operation:  8:30 am - 5:30 pm, M-F.  Also accepts Medicaid and self-pay.  °[facility] [place] [address], [place] Phone: (336) 878-6027   °[facility] [address], [place], [place] (336)723-1848, Ext. 123 Mondays & Thursdays: 7-9 AM.  First 15 patients are seen on a first come, first serve basis. °  ° °Medicaid-accepting [place] Providers: ° °Organization         Address  Phone   Notes  °[facility] [address], [place] (336) 641-2100 Also accepts self-pay patients.  °[facility] [address], [place] ° (336) 856-9996   °[facility] [address], [place]  (336) 288-8857   °[facility] [address] [place] [address], [place] (336) 299-7000   °Veita Bland [address], [place]  ° (336) 373-1557 Only accepts [place] Access Medicaid patients after they have their name applied to their card.  ° °Self-Pay (no insurance) in [place]: ° °Organization         Address  Phone   Notes  °Sickle Cell Patients, [facility] [address], [place] (336) 832-1970   °[facility] [address], [place] (336) 832-4400   °[facility] [place] ° [address], [place] (336) 992-4800   °[facility]/Dr. Osei-Bonsu ° [address] [place] [address], [place] or [address], [place] (336) 841-8500 Phone number for both [place] and [place] locations is the same.  °[facility] [address], [place] (336) 299-0000   °[facility] [address] [place] [address] [place] or [address] (336) 852-7530 °(336) 878-2260   °[facility] [address], [place] (336) 350-1642, phone; (336) 294-5005, fax Sees patients 1st and 3rd Saturday of every month.  Must not qualify for public or private insurance (i.e. Medicaid, Medicare, [place] Health Choice, Veterans' Benefits) • Household income should be no more than 200% of the poverty level •The clinic cannot treat you if you are pregnant or think you are pregnant • Sexually transmitted diseases are not treated at the clinic.  ° ° °Dental Care: °Organization         Address  Phone  Notes  °[place] [facility] [address],  (336) 641-6152 Accepts children up to age 21 who are enrolled in Medicaid or [place] Health Choice; pregnant women with a Medicaid card; and children who have applied for Medicaid or [place] Health Choice, but were declined, whose parents can pay a reduced fee at time of service.  °[place]  [facility] [place]  [address], [place] (336) 641-7733 Accepts children up to age 21 who are enrolled in Medicaid or [place] Health Choice; pregnant women with a Medicaid card; and children who have applied for Medicaid or [place] Health Choice, but were declined, whose parents can pay a reduced fee at time of service.  °[facility] ° [address],  (336) 641-4533 Patients are seen by appointment only. Walk-ins are not accepted. [facility] will see patients 18 years of age and older. °Monday - Tuesday (8am-5pm) °Most Wednesdays (8:30-5pm) °$30 per visit, cash only  °[facility] ° [address], [place] (336) 641-4533 Patients are seen by appointment only. Walk-ins are not accepted. [facility] will see patients 18 years of age and older. °One   Wednesday Evening (Monthly: Volunteer Based).  $30 per visit, cash only  °[facility]  (919) [phone] for adults; Children under age 4, call Graduate Pediatric Dentistry at ([phone]) [phone]. Children aged 4-14, please call ([phone]) [phone] to request a pediatric application. ° Dental services are provided in all areas of dental care including fillings, crowns and bridges, complete and partial dentures, implants, gum treatment, root canals, and extractions. Preventive care is also provided. Treatment is provided to both adults and children. °Patients are selected via a lottery and there is often a waiting list. °  °[facility] [address], °[place] ° (336) 763-8833 [email or web address] °  °[facility] [address], [place], [place] (336)723-1848, Ext. 123 Second and Fourth Thursday of each month, opens at 6:30 AM; Clinic ends at 9 AM.  Patients are seen on a first-come first-served basis, and a limited number are seen during each clinic.  ° °[facility] ° [address], [place], [place] (336) 723-7904    Eligibility Requirements °You must have lived in [place], [place], or [place] counties for at least the last three months. °  You cannot be eligible for state or federal sponsored healthcare insurance, including Veterans Administration, Medicaid, or Medicare. °  You generally cannot be eligible for healthcare insurance through your employer.  °  How to apply: °Eligibility screenings are held every Tuesday and Wednesday afternoon from 1:00 pm until 4:00 pm. You do not need an appointment for the interview!  °[facility] [address], [place], [place] 336-631-2330   °[facility]  336-342-8273   °[facility]  336-703-3100   °[facility]  336-570-6415   ° °Behavioral Health Resources in the Community: °Intensive Outpatient Programs °Organization         Address  Phone  Notes  °[facility] [address], [place], [place] 336-878-6098   °[facility] [address], [place], [place] 336-832-9800   °[facility] [address], [place], [place] ° 336-882-2125   °[facility] [address],  °[place], [place] 1-800-853-5163 or 336-641-4981   °Substance Abuse Resources °Organization         Address  Phone  Notes  °[facility]  336-882-2125   °[facility]  336-784-9470   °[facility]  336-285-9073   °[facility]  336-845-3988   °Residential & Outpatient Substance Abuse Program  1-800-659-3381   °Psychological Services °Organization         Address  Phone  Notes  °[facility]  336- 832-9600   °[facility]  336- 378-7881   °[facility] [address], [place] 1-800-853-5163 or 336-641-4981   ° °Mobile Crisis Teams °Organization         Address  Phone  Notes  °[facility], Mobile Crisis Care Unit  1-877-626-1772   °Assertive °[facility] ° [address]  [place], [place] 336-834-9664   °Sharon DeEsch [address] °[place] 336-554-5454   ° °Self-Help/Support Groups °Organization         Address  Phone             Notes  °[facility] - variety of support groups  336- 373-1402 Call for more information  °Narcotics Anonymous (NA), [facility] [address], °[place]  2 meetings at this location  ° °  Residential Treatment Programs Organization         Address  Phone  Notes  [facility] [address]    [place]  1-(862)409-7997   [facility]  [address] [place] [id number], [place], [place]   [place], [place] [phone] Admissions: 8am-3pm M-F  [facility] [address]    [place], [place] [phone]   [facility] [address] [place], [place], [place]   [facility] [address],  [place], [place]   [facility] Intensive Outpatient [address], [place], [place]   [facility] [place]  [place], [place] 1-(216) 722-5751 or (765)072-2068   [facility] [address] [place], [place] Accepts Medicaid  [facility] [address],  [place] 1-408 183 0259 Substance Abuse/Addiction Treatment   [facility] Organization         Address  Phone  Notes  [facility]  956-310-7750   Domenic Schwab, PhD [address] [place], [place]   (581) 600-6605 or 331-378-2112   [facility] [place], [place] (763)[phone]   [facility] [address] [place], [place] [phone] Insurance/Medicaid/sponsorship through [facility] Families [address] [place]                                    [place], [place] (310)010-2423 [place] [address], [place] 772-004-8304    Dr. Adele Schilder  [phone]   [facility] 1) [address], [place] 2) [place] 3)  [address], [place] [phone]  (317)[phone]   [place] 609-489-9996 or (703) 640-6434 (After Hours)       Alcohol Use Disorder Alcohol use disorder is a mental disorder. It is not a one-time incident of heavy drinking. Alcohol use disorder is the excessive and uncontrollable use of alcohol over time that leads to problems with functioning in one or more areas of daily living. People with this disorder risk harming themselves and others when they drink to excess. Alcohol use disorder also can cause other mental disorders, such as mood and anxiety disorders, and serious physical problems. People with alcohol use disorder often misuse other drugs.  Alcohol use disorder is common and widespread. Some people with this disorder drink alcohol to cope with or escape from negative life events. Others drink to relieve chronic pain or symptoms of mental illness. People with a family history of alcohol use disorder are at higher risk of losing control and using alcohol to excess.  Drinking too much alcohol can cause injury, accidents, and health problems. One drink can be too much when you are:  Working.  Pregnant or breastfeeding.  Taking medicines. Ask your doctor.  Driving or planning to drive. SYMPTOMS  Signs and symptoms of alcohol use disorder may include the following:   Consumption ofalcohol inlarger amounts or over a longer period of time than intended.  Multiple unsuccessful attempts to cutdown or control alcohol use.   A great deal of time spent obtaining alcohol,  using alcohol, or recovering from the effects of alcohol (hangover).  A strong desire or urge to use alcohol (cravings).    Continued use of alcohol despite problems at work, school, or home because of alcohol use.   Continued use of alcohol despite problems in relationships because of alcohol use.  Continued use of alcohol in situations when it is physically hazardous, such as driving a car.  Continued use of alcohol despite awareness of a physical or psychological problem that is likely related to alcohol use. Physical problems related to alcohol use can involve the brain, heart, liver, stomach, and intestines. Psychological problems related to alcohol use include intoxication, depression, anxiety, psychosis, delirium, and dementia.   The need for increased amounts of alcohol to achieve the same desired effect, or a decreased effect from the consumption of the same amount of alcohol (tolerance).  Withdrawal symptoms upon reducing or stopping alcohol use, or alcohol use to reduce or avoid withdrawal symptoms. Withdrawal symptoms include:  Racing heart.  Hand tremor.  Difficulty sleeping.  Nausea.  Vomiting.  Hallucinations.  Restlessness.  Seizures. DIAGNOSIS Alcohol use disorder is diagnosed through an assessment by your health care provider. Your health care provider may start by asking three or four questions to screen for excessive or problematic alcohol use. To confirm a diagnosis of alcohol use disorder, at least two symptoms must be present within a 34-month period. The severity of alcohol use disorder depends on the number of symptoms:  Mild--two or three.  Moderate--four or five.  Severe--six or more. Your health care provider may perform a physical exam or use results from lab tests to see if you have physical problems resulting from alcohol use. Your health care provider may refer you to a mental health professional for evaluation. TREATMENT  Some people with alcohol use disorder are able to reduce their alcohol use to low-risk levels. Some people with alcohol use disorder need to  quit drinking alcohol. When necessary, mental health professionals with specialized training in substance use treatment can help. Your health care provider can help you decide how severe your alcohol use disorder is and what type of treatment you need. The following forms of treatment are available:   Detoxification. Detoxification involves the use of prescription medicines to prevent alcohol withdrawal symptoms in the first week after quitting. This is important for people with a history of symptoms of withdrawal and for heavy drinkers who are likely to have withdrawal symptoms. Alcohol withdrawal can be dangerous and, in severe cases, cause death. Detoxification is usually provided in a hospital or in-patient substance use treatment facility.  Counseling or talk therapy. Talk therapy is provided by substance use treatment counselors. It addresses the reasons people use alcohol and ways to keep them from drinking again. The goals of talk therapy are to help people with alcohol use disorder find healthy activities and ways to cope with life stress, to identify and avoid triggers for alcohol use, and to handle cravings, which can cause relapse.  Medicines.Different medicines can help treat alcohol use disorder through the following actions:  Decrease alcohol cravings.  Decrease the positive reward response felt from alcohol use.  Produce an uncomfortable physical reaction when alcohol is used (aversion therapy).  Support groups. Support groups are run by people who have quit drinking. They provide emotional support, advice, and guidance. These forms of treatment are often combined. Some people with alcohol use disorder benefit from intensive combination treatment provided by specialized substance use treatment centers. Both inpatient and  outpatient treatment programs are available.   This information is not intended to replace advice given to you by your health care provider. Make sure you discuss  any questions you have with your health care provider.   Document Released: 08/13/2004 Document Revised: 07/27/2014 Document Reviewed: 10/13/2012 Elsevier Interactive Patient Education [employer].

## 2015-09-07 NOTE — ED Notes (Signed)
Pt refused to take postassium pills

## 2015-09-07 NOTE — ED Notes (Signed)
Pt requesting pain medication at this time for abdominal pain

## 2015-09-07 NOTE — ED Notes (Signed)
Patient called for pain medicine. RN notified
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
## 2015-09-19 ENCOUNTER — Emergency Department (HOSPITAL_COMMUNITY)
Admission: EM | Admit: 2015-09-19 | Discharge: 2015-09-19 | Disposition: A | Discharge status: Home / Self Care | Payer: Self-pay | Attending: Emergency Medicine | Admitting: Emergency Medicine

## 2015-09-19 ENCOUNTER — Encounter (HOSPITAL_COMMUNITY): Payer: Self-pay | Admitting: Emergency Medicine

## 2015-09-19 DIAGNOSIS — R197 Diarrhea, unspecified: Secondary | ICD-10-CM | POA: Insufficient documentation

## 2015-09-19 DIAGNOSIS — R1030 Lower abdominal pain, unspecified: Secondary | ICD-10-CM | POA: Insufficient documentation

## 2015-09-19 DIAGNOSIS — R112 Nausea with vomiting, unspecified: Secondary | ICD-10-CM | POA: Insufficient documentation

## 2015-09-19 LAB — COMPREHENSIVE METABOLIC PANEL
ALT: 35 U/L (ref 14–54)
AST: 58 U/L — AB (ref 15–41)
Albumin: 4.8 g/dL (ref 3.5–5.0)
Alkaline Phosphatase: 130 U/L — ABNORMAL HIGH (ref 38–126)
Anion gap: 11 (ref 5–15)
BUN: 7 mg/dL (ref 6–20)
CHLORIDE: 103 mmol/L (ref 101–111)
CO2: 25 mmol/L (ref 22–32)
CREATININE: 0.5 mg/dL (ref 0.44–1.00)
Calcium: 8.8 mg/dL — ABNORMAL LOW (ref 8.9–10.3)
GFR calc Af Amer: >60 mL/min (ref >60)
Glucose, Bld: 121 mg/dL — ABNORMAL HIGH (ref 65–99)
Potassium: 3 mmol/L — ABNORMAL LOW (ref 3.5–5.1)
Sodium: 139 mmol/L (ref 135–145)
Total Bilirubin: 0.6 mg/dL (ref 0.3–1.2)
Total Protein: 8.3 g/dL — ABNORMAL HIGH (ref 6.5–8.1)

## 2015-09-19 LAB — CBC
HCT: 33.1 % — ABNORMAL LOW (ref 36.0–46.0)
Hemoglobin: 10 g/dL — ABNORMAL LOW (ref 12.0–15.0)
MCH: 24.6 pg — AB (ref 26.0–34.0)
MCHC: 30.2 g/dL (ref 30.0–36.0)
MCV: 81.3 fL (ref 78.0–100.0)
PLATELETS: 175 10*3/uL (ref 150–400)
RBC: 4.07 MIL/uL (ref 3.87–5.11)
RDW: 24.2 % — AB (ref 11.5–15.5)
WBC: 2.1 10*3/uL — AB (ref 4.0–10.5)

## 2015-09-19 LAB — LIPASE, BLOOD: LIPASE: 26 U/L (ref 11–51)

## 2015-09-19 MED ORDER — ONDANSETRON 4 MG PO TBDP
4.0000 mg | ORAL_TABLET | Freq: Once | ORAL | Status: AC | PRN
  Administered 2015-09-19: 4 mg via ORAL
  Filled 2015-09-19: qty 1

## 2015-09-19 NOTE — ED Notes (Signed)
No answer when called from lobby x3. 

## 2015-09-19 NOTE — ED Notes (Signed)
Patient presents for lower abdominal pain, N/V/D, patient reports RRB in diarrhea. Reports 8+ episodes of vomiting, 8+ episodes of diarrhea. History of pancreatitis, and colitis.

## 2015-09-19 NOTE — ED Notes (Signed)
Pt not found in lobby when called for room x 1.

## 2015-09-19 NOTE — ED Notes (Signed)
No answer when called for room x 2 

## 2015-09-19 NOTE — ED Notes (Addendum)
Patient presents via EMS for lower abdominal pain and nausea x1.5 weeks. Per EMS patient had pseudoseizure in route. No postictal state. A&O x4, ambulatory from stretcher to triage.

## 2015-10-08 ENCOUNTER — Emergency Department (HOSPITAL_COMMUNITY)
Admission: EM | Admit: 2015-10-08 | Discharge: 2015-10-08 | Disposition: A | Discharge status: Home / Self Care | Payer: Self-pay | Attending: Emergency Medicine | Admitting: Emergency Medicine

## 2015-10-08 DIAGNOSIS — Y9289 Other specified places as the place of occurrence of the external cause: Secondary | ICD-10-CM | POA: Insufficient documentation

## 2015-10-08 DIAGNOSIS — Y9389 Activity, other specified: Secondary | ICD-10-CM | POA: Insufficient documentation

## 2015-10-08 DIAGNOSIS — Y998 Other external cause status: Secondary | ICD-10-CM | POA: Insufficient documentation

## 2015-10-08 DIAGNOSIS — S0993XA Unspecified injury of face, initial encounter: Secondary | ICD-10-CM | POA: Insufficient documentation

## 2015-10-08 NOTE — ED Notes (Signed)
No answer x 2 and 3.
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
## 2015-10-08 NOTE — ED Notes (Signed)
Pt called x 1 w/o answer.  

## 2015-10-10 ENCOUNTER — Encounter (HOSPITAL_COMMUNITY): Payer: Self-pay | Admitting: Emergency Medicine

## 2015-10-10 ENCOUNTER — Emergency Department (HOSPITAL_COMMUNITY)
Admission: EM | Admit: 2015-10-10 | Discharge: 2015-10-11 | Disposition: A | Discharge status: Home / Self Care | Payer: Self-pay | Attending: Emergency Medicine | Admitting: Emergency Medicine

## 2015-10-10 ENCOUNTER — Emergency Department (HOSPITAL_COMMUNITY): Payer: Self-pay

## 2015-10-10 DIAGNOSIS — R51 Headache: Secondary | ICD-10-CM

## 2015-10-10 DIAGNOSIS — Z8679 Personal history of other diseases of the circulatory system: Secondary | ICD-10-CM | POA: Insufficient documentation

## 2015-10-10 DIAGNOSIS — S40021A Contusion of right upper arm, initial encounter: Secondary | ICD-10-CM | POA: Insufficient documentation

## 2015-10-10 DIAGNOSIS — F329 Major depressive disorder, single episode, unspecified: Secondary | ICD-10-CM | POA: Insufficient documentation

## 2015-10-10 DIAGNOSIS — S40022A Contusion of left upper arm, initial encounter: Secondary | ICD-10-CM | POA: Insufficient documentation

## 2015-10-10 DIAGNOSIS — Y9389 Activity, other specified: Secondary | ICD-10-CM | POA: Insufficient documentation

## 2015-10-10 DIAGNOSIS — R519 Headache, unspecified: Secondary | ICD-10-CM

## 2015-10-10 DIAGNOSIS — Y9289 Other specified places as the place of occurrence of the external cause: Secondary | ICD-10-CM | POA: Insufficient documentation

## 2015-10-10 DIAGNOSIS — Z8719 Personal history of other diseases of the digestive system: Secondary | ICD-10-CM | POA: Insufficient documentation

## 2015-10-10 DIAGNOSIS — Z862 Personal history of diseases of the blood and blood-forming organs and certain disorders involving the immune mechanism: Secondary | ICD-10-CM | POA: Insufficient documentation

## 2015-10-10 DIAGNOSIS — S0083XA Contusion of other part of head, initial encounter: Secondary | ICD-10-CM | POA: Insufficient documentation

## 2015-10-10 DIAGNOSIS — D61818 Other pancytopenia: Secondary | ICD-10-CM

## 2015-10-10 DIAGNOSIS — F419 Anxiety disorder, unspecified: Secondary | ICD-10-CM | POA: Insufficient documentation

## 2015-10-10 DIAGNOSIS — Z880 Allergy status to penicillin: Secondary | ICD-10-CM | POA: Insufficient documentation

## 2015-10-10 DIAGNOSIS — Z3202 Encounter for pregnancy test, result negative: Secondary | ICD-10-CM | POA: Insufficient documentation

## 2015-10-10 DIAGNOSIS — S6991XA Unspecified injury of right wrist, hand and finger(s), initial encounter: Secondary | ICD-10-CM | POA: Insufficient documentation

## 2015-10-10 DIAGNOSIS — Z8742 Personal history of other diseases of the female genital tract: Secondary | ICD-10-CM | POA: Insufficient documentation

## 2015-10-10 DIAGNOSIS — S300XXA Contusion of lower back and pelvis, initial encounter: Secondary | ICD-10-CM | POA: Insufficient documentation

## 2015-10-10 DIAGNOSIS — T07XXXA Unspecified multiple injuries, initial encounter: Secondary | ICD-10-CM

## 2015-10-10 DIAGNOSIS — Y998 Other external cause status: Secondary | ICD-10-CM | POA: Insufficient documentation

## 2015-10-10 DIAGNOSIS — S40011A Contusion of right shoulder, initial encounter: Secondary | ICD-10-CM | POA: Insufficient documentation

## 2015-10-10 LAB — URINE MICROSCOPIC-ADD ON

## 2015-10-10 LAB — POC URINE PREG, ED: PREG TEST UR: NEGATIVE

## 2015-10-10 LAB — URINALYSIS, ROUTINE W REFLEX MICROSCOPIC
BILIRUBIN URINE: NEGATIVE
Glucose, UA: NEGATIVE mg/dL
KETONES UR: NEGATIVE mg/dL
Leukocytes, UA: NEGATIVE
NITRITE: NEGATIVE
PH: 6 (ref 5.0–8.0)
PROTEIN: NEGATIVE mg/dL
Specific Gravity, Urine: 1.015 (ref 1.005–1.030)

## 2015-10-10 MED ORDER — HYDROCODONE-ACETAMINOPHEN 5-325 MG PO TABS
1.0000 | ORAL_TABLET | Freq: Once | ORAL | Status: AC
  Administered 2015-10-10: 1 via ORAL
  Filled 2015-10-10: qty 1
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
## 2015-10-10 NOTE — ED Notes (Signed)
Pt transported to XRay 

## 2015-10-10 NOTE — ED Notes (Addendum)
Purple bruises noted to both right and left buttock of about 1 inch diameter.  Dark purple bruise noted to right jaw.

## 2015-10-11 LAB — COMPREHENSIVE METABOLIC PANEL
ALBUMIN: 4.4 g/dL (ref 3.5–5.0)
ALT: 97 U/L — ABNORMAL HIGH (ref 14–54)
ANION GAP: 13 (ref 5–15)
AST: 408 U/L — ABNORMAL HIGH (ref 15–41)
Alkaline Phosphatase: 195 U/L — ABNORMAL HIGH (ref 38–126)
BUN: 10 mg/dL (ref 6–20)
CHLORIDE: 102 mmol/L (ref 101–111)
CO2: 23 mmol/L (ref 22–32)
Calcium: 8.6 mg/dL — ABNORMAL LOW (ref 8.9–10.3)
Creatinine, Ser: 0.42 mg/dL — ABNORMAL LOW (ref 0.44–1.00)
GFR calc Af Amer: >60 mL/min (ref >60)
GFR calc non Af Amer: >60 mL/min (ref >60)
GLUCOSE: 106 mg/dL — AB (ref 65–99)
POTASSIUM: 3.8 mmol/L (ref 3.5–5.1)
SODIUM: 138 mmol/L (ref 135–145)
TOTAL PROTEIN: 7.9 g/dL (ref 6.5–8.1)
Total Bilirubin: 1 mg/dL (ref 0.3–1.2)

## 2015-10-11 LAB — CBC WITH DIFFERENTIAL/PLATELET
BASOS PCT: 2 %
Basophils Absolute: 0 10*3/uL (ref 0.0–0.1)
EOS ABS: 0 10*3/uL (ref 0.0–0.7)
Eosinophils Relative: 1 %
HCT: 26.7 % — ABNORMAL LOW (ref 36.0–46.0)
HEMOGLOBIN: 8.4 g/dL — AB (ref 12.0–15.0)
LYMPHS PCT: 35 %
Lymphs Abs: 0.6 10*3/uL — ABNORMAL LOW (ref 0.7–4.0)
MCH: 26.3 pg (ref 26.0–34.0)
MCHC: 31.5 g/dL (ref 30.0–36.0)
MCV: 83.7 fL (ref 78.0–100.0)
MONOS PCT: 20 %
Monocytes Absolute: 0.4 10*3/uL (ref 0.1–1.0)
NEUTROS PCT: 42 %
Neutro Abs: 0.8 10*3/uL — ABNORMAL LOW (ref 1.7–7.7)
Platelets: 97 10*3/uL — ABNORMAL LOW (ref 150–400)
RBC: 3.19 MIL/uL — ABNORMAL LOW (ref 3.87–5.11)
RDW: 29.7 % — ABNORMAL HIGH (ref 11.5–15.5)
WBC: 1.8 10*3/uL — ABNORMAL LOW (ref 4.0–10.5)

## 2015-10-11 LAB — LIPASE, BLOOD: Lipase: 36 U/L (ref 11–51)

## 2015-10-11 MED ORDER — KETOROLAC TROMETHAMINE 60 MG/2ML IM SOLN
60.0000 mg | Freq: Once | INTRAMUSCULAR | Status: AC
  Administered 2015-10-11: 60 mg via INTRAMUSCULAR
  Filled 2015-10-11: qty 2

## 2015-10-11 MED ORDER — METHOCARBAMOL 500 MG PO TABS: 500.0000 mg | ORAL_TABLET | Freq: Two times a day (BID) | ORAL | Status: DC

## 2015-10-11 NOTE — ED Notes (Signed)
Taxi numbers provided to pt per pt's request

## 2015-10-11 NOTE — ED Notes (Signed)
MD made aware of decreased blood counts; MD confirms pt should be discharged

## 2015-10-25 IMAGING — CR DG CHEST 2V
3 series · 3 of 3 positions shown · non-contrast
Comparison: DG CHEST 2 VIEW dated 11/28/2013

CLINICAL DATA: Shortness of breath.  Chest pain.  Foot edema.

EXAM:
CHEST  2 VIEW

[w chest lat (1 of 2)]
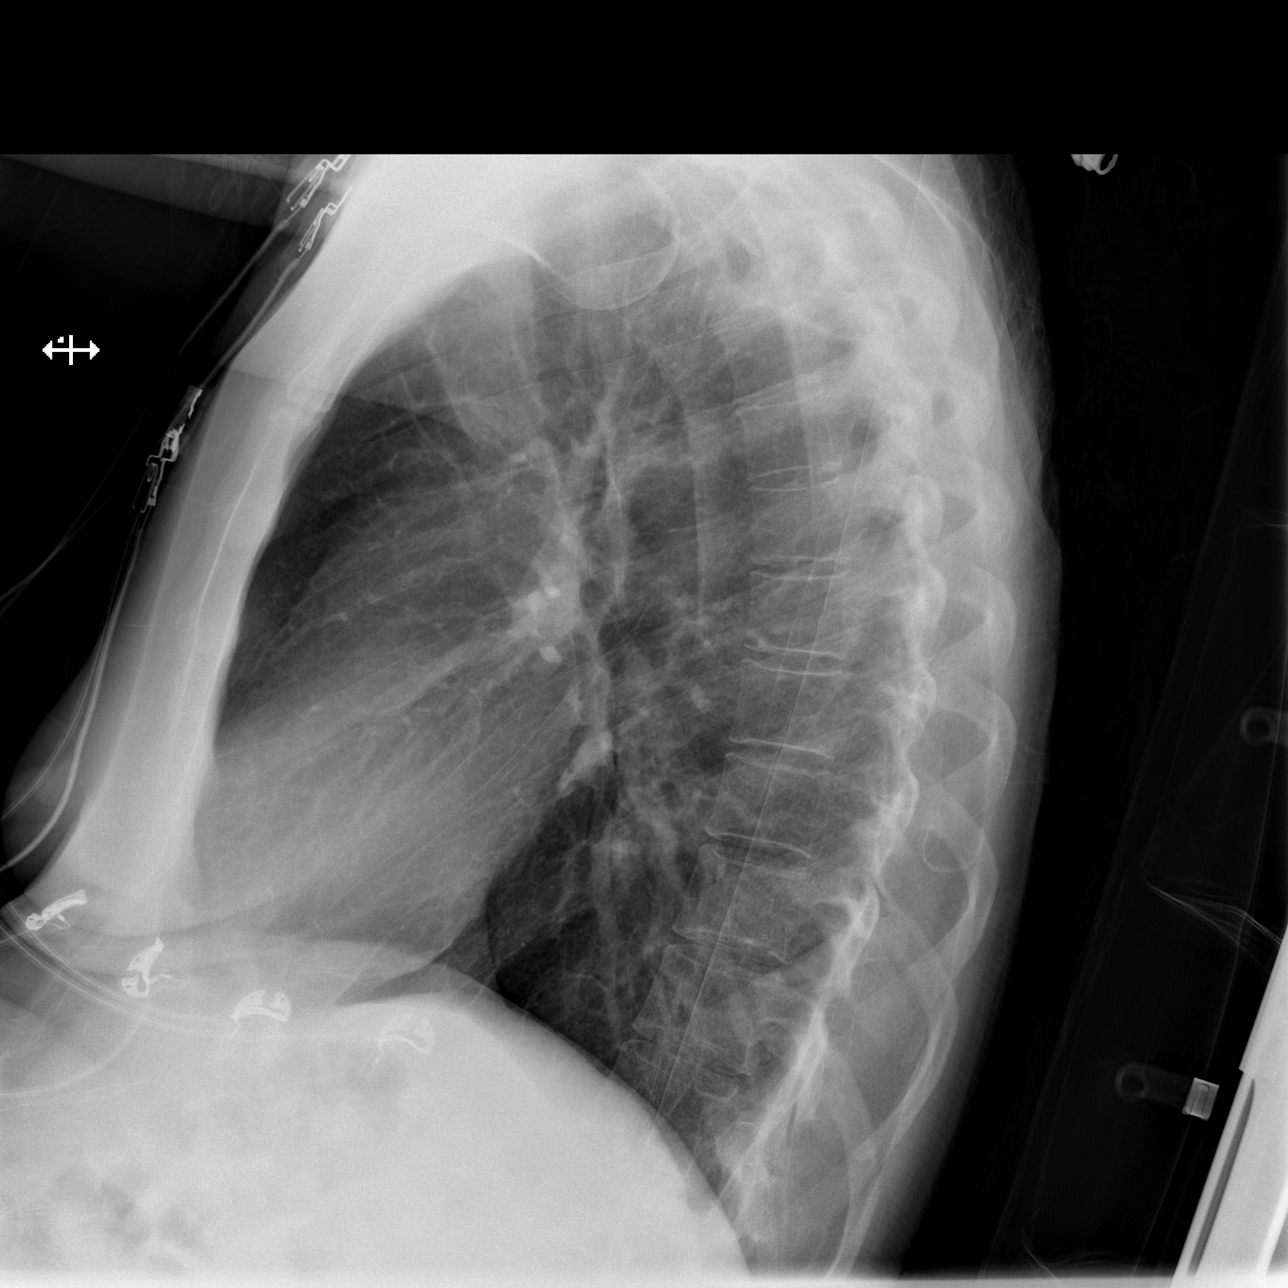

[w chest lat (2 of 2)]
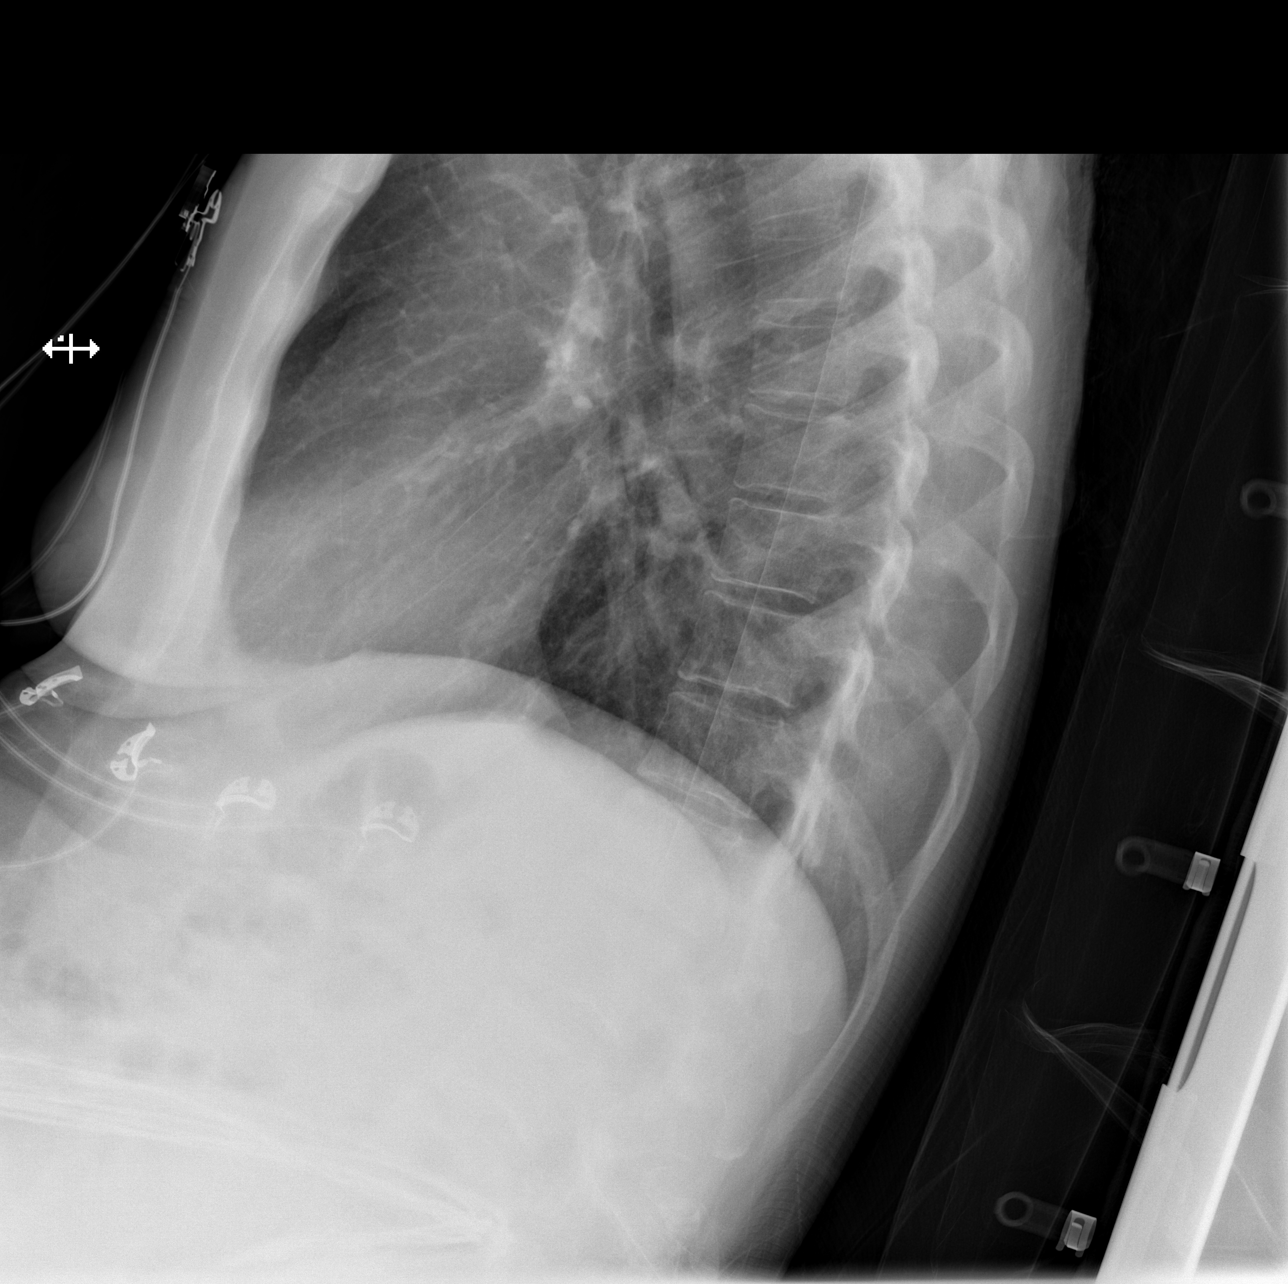

[x chest ap]
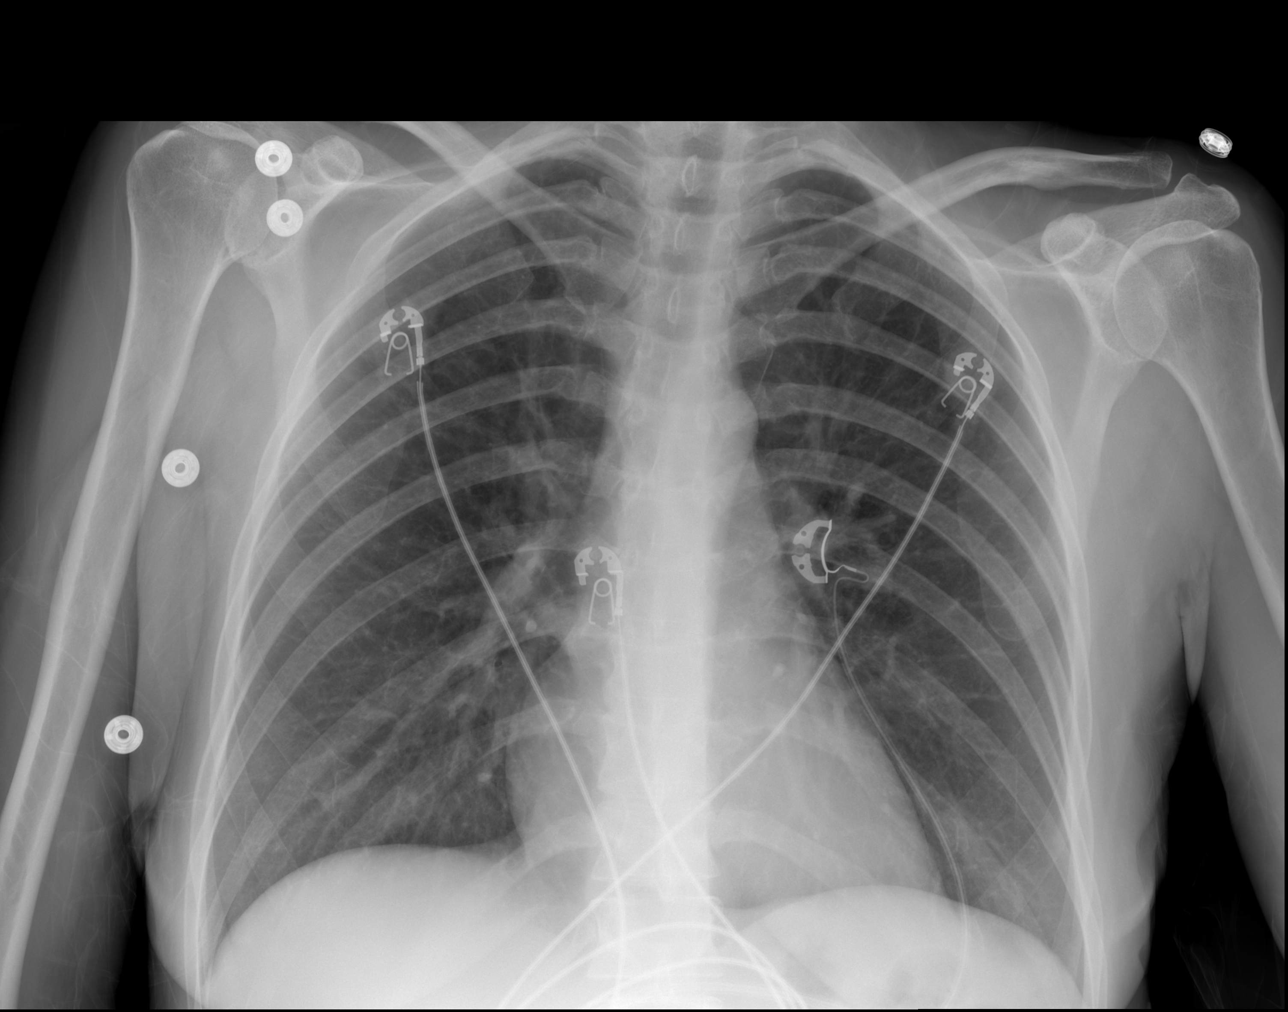

[3 of 3 positions shown; findings below may reference images not displayed]

FINDINGS: The heart size and mediastinal contours are within normal limits.
Both lungs are clear. The visualized skeletal structures are
unremarkable.
IMPRESSION: No active cardiopulmonary disease.

## 2015-10-26 ENCOUNTER — Encounter (HOSPITAL_COMMUNITY): Payer: Self-pay | Admitting: Emergency Medicine

## 2015-10-26 ENCOUNTER — Emergency Department (HOSPITAL_COMMUNITY): Payer: Self-pay

## 2015-10-26 ENCOUNTER — Emergency Department (HOSPITAL_COMMUNITY)
Admission: EM | Admit: 2015-10-26 | Discharge: 2015-10-26 | Disposition: A | Discharge status: Home / Self Care | Payer: Self-pay | Attending: Emergency Medicine | Admitting: Emergency Medicine

## 2015-10-26 DIAGNOSIS — Z3202 Encounter for pregnancy test, result negative: Secondary | ICD-10-CM | POA: Insufficient documentation

## 2015-10-26 DIAGNOSIS — Z862 Personal history of diseases of the blood and blood-forming organs and certain disorders involving the immune mechanism: Secondary | ICD-10-CM | POA: Insufficient documentation

## 2015-10-26 DIAGNOSIS — R1031 Right lower quadrant pain: Secondary | ICD-10-CM | POA: Insufficient documentation

## 2015-10-26 DIAGNOSIS — F419 Anxiety disorder, unspecified: Secondary | ICD-10-CM | POA: Insufficient documentation

## 2015-10-26 DIAGNOSIS — Z7982 Long term (current) use of aspirin: Secondary | ICD-10-CM | POA: Insufficient documentation

## 2015-10-26 DIAGNOSIS — R109 Unspecified abdominal pain: Secondary | ICD-10-CM

## 2015-10-26 DIAGNOSIS — Z8742 Personal history of other diseases of the female genital tract: Secondary | ICD-10-CM | POA: Insufficient documentation

## 2015-10-26 DIAGNOSIS — Z880 Allergy status to penicillin: Secondary | ICD-10-CM | POA: Insufficient documentation

## 2015-10-26 DIAGNOSIS — Z8674 Personal history of sudden cardiac arrest: Secondary | ICD-10-CM | POA: Insufficient documentation

## 2015-10-26 DIAGNOSIS — Z8719 Personal history of other diseases of the digestive system: Secondary | ICD-10-CM | POA: Insufficient documentation

## 2015-10-26 DIAGNOSIS — Z9889 Other specified postprocedural states: Secondary | ICD-10-CM | POA: Insufficient documentation

## 2015-10-26 DIAGNOSIS — Z9049 Acquired absence of other specified parts of digestive tract: Secondary | ICD-10-CM | POA: Insufficient documentation

## 2015-10-26 DIAGNOSIS — M7989 Other specified soft tissue disorders: Secondary | ICD-10-CM | POA: Insufficient documentation

## 2015-10-26 DIAGNOSIS — G8929 Other chronic pain: Secondary | ICD-10-CM | POA: Insufficient documentation

## 2015-10-26 LAB — CBC WITH DIFFERENTIAL/PLATELET
BASOS ABS: 0 10*3/uL (ref 0.0–0.1)
Basophils Relative: 2 %
Eosinophils Absolute: 0 10*3/uL (ref 0.0–0.7)
Eosinophils Relative: 2 %
HCT: 28.1 % — ABNORMAL LOW (ref 36.0–46.0)
Hemoglobin: 8.8 g/dL — ABNORMAL LOW (ref 12.0–15.0)
LYMPHS ABS: 0.8 10*3/uL (ref 0.7–4.0)
Lymphocytes Relative: 38 %
MCH: 26.4 pg (ref 26.0–34.0)
MCHC: 31.3 g/dL (ref 30.0–36.0)
MCV: 84.4 fL (ref 78.0–100.0)
MONO ABS: 0.2 10*3/uL (ref 0.1–1.0)
Monocytes Relative: 10 %
NEUTROS ABS: 1 10*3/uL — AB (ref 1.7–7.7)
Neutrophils Relative %: 48 %
PLATELETS: 106 10*3/uL — AB (ref 150–400)
RBC: 3.33 MIL/uL — AB (ref 3.87–5.11)
RDW: 27.3 % — AB (ref 11.5–15.5)
WBC: 2 10*3/uL — ABNORMAL LOW (ref 4.0–10.5)

## 2015-10-26 LAB — COMPREHENSIVE METABOLIC PANEL
ALBUMIN: 4.9 g/dL (ref 3.5–5.0)
ALK PHOS: 198 U/L — AB (ref 38–126)
ALT: 85 U/L — ABNORMAL HIGH (ref 14–54)
ANION GAP: 16 — AB (ref 5–15)
AST: 355 U/L — AB (ref 15–41)
BILIRUBIN TOTAL: 1.1 mg/dL (ref 0.3–1.2)
BUN: 5 mg/dL — AB (ref 6–20)
CO2: 21 mmol/L — AB (ref 22–32)
Calcium: 8.6 mg/dL — ABNORMAL LOW (ref 8.9–10.3)
Chloride: 104 mmol/L (ref 101–111)
Creatinine, Ser: 0.35 mg/dL — ABNORMAL LOW (ref 0.44–1.00)
GFR calc Af Amer: >60 mL/min (ref >60)
GFR calc non Af Amer: >60 mL/min (ref >60)
GLUCOSE: 85 mg/dL (ref 65–99)
POTASSIUM: 3.7 mmol/L (ref 3.5–5.1)
SODIUM: 141 mmol/L (ref 135–145)
Total Protein: 8.4 g/dL — ABNORMAL HIGH (ref 6.5–8.1)

## 2015-10-26 LAB — I-STAT BETA HCG BLOOD, ED (MC, WL, AP ONLY): I-stat hCG, quantitative: <5 m[IU]/mL (ref <5)

## 2015-10-26 LAB — URINALYSIS, ROUTINE W REFLEX MICROSCOPIC
BILIRUBIN URINE: NEGATIVE
GLUCOSE, UA: NEGATIVE mg/dL
Hgb urine dipstick: NEGATIVE
KETONES UR: NEGATIVE mg/dL
LEUKOCYTES UA: NEGATIVE
NITRITE: NEGATIVE
PROTEIN: NEGATIVE mg/dL
Specific Gravity, Urine: 1.011 (ref 1.005–1.030)
pH: 5.5 (ref 5.0–8.0)

## 2015-10-26 LAB — LIPASE, BLOOD: Lipase: 27 U/L (ref 11–51)

## 2015-10-26 MED ORDER — PROMETHAZINE HCL 25 MG RE SUPP
25.0000 mg | Freq: Four times a day (QID) | RECTAL | Status: DC | PRN
Start: 2015-10-26 — End: 2017-01-19

## 2015-10-26 MED ORDER — ONDANSETRON 4 MG PO TBDP: 4.0000 mg | ORAL_TABLET | ORAL | Status: DC | PRN

## 2015-10-26 MED ORDER — IOPAMIDOL (ISOVUE-300) INJECTION 61%
100.0000 mL | Freq: Once | INTRAVENOUS | Status: AC | PRN
  Administered 2015-10-26: 100 mL via INTRAVENOUS

## 2015-10-26 MED ORDER — ONDANSETRON HCL 4 MG/2ML IJ SOLN
4.0000 mg | Freq: Once | INTRAMUSCULAR | Status: AC
  Administered 2015-10-26: 4 mg via INTRAVENOUS
  Filled 2015-10-26: qty 2

## 2015-10-26 MED ORDER — SODIUM CHLORIDE 0.9 % IV BOLUS (SEPSIS)
1000.0000 mL | Freq: Once | INTRAVENOUS | Status: AC
  Administered 2015-10-26: 1000 mL via INTRAVENOUS
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
## 2015-10-26 NOTE — Discharge Instructions (Signed)
Abdominal Pain, Adult Many things can cause abdominal pain. Usually, abdominal pain is not caused by a disease and will improve without treatment. It can often be observed and treated at home. Your health care provider will do a physical exam and possibly order blood tests and X-rays to help determine the seriousness of your pain. However, in many cases, more time must pass before a clear cause of the pain can be found. Before that point, your health care provider may not know if you need more testing or further treatment. HOME CARE INSTRUCTIONS Monitor your abdominal pain for any changes. The following actions may help to alleviate any discomfort you are experiencing:  Only take over-the-counter or prescription medicines as directed by your health care provider.  Do not take laxatives unless directed to do so by your health care provider.  Try a clear liquid diet (broth, tea, or water) as directed by your health care provider. Slowly move to a bland diet as tolerated. SEEK MEDICAL CARE IF:  You have unexplained abdominal pain.  You have abdominal pain associated with nausea or diarrhea.  You have pain when you urinate or have a bowel movement.  You experience abdominal pain that wakes you in the night.  You have abdominal pain that is worsened or improved by eating food.  You have abdominal pain that is worsened with eating fatty foods.  You have a fever. SEEK IMMEDIATE MEDICAL CARE IF:  Your pain does not go away within 2 hours.  You keep throwing up (vomiting).  Your pain is felt only in portions of the abdomen, such as the right side or the left lower portion of the abdomen.  You pass bloody or black tarry stools. MAKE SURE YOU:  Understand these instructions.  Will watch your condition.  Will get help right away if you are not doing well or get worse.   This information is not intended to replace advice given to you by your health care provider. Make sure you discuss  any questions you have with your health care provider.   Document Released: 04/15/2005 Document Revised: 03/27/2015 Document Reviewed: 03/15/2013 Elsevier Interactive Patient Education 2016 Elsevier Inc. Bloody Diarrhea Bloody diarrhea can be caused by many different conditions. Most of the time bloody diarrhea is the result of food poisoning or minor infections. Bloody diarrhea usually improves over 2 to 3 days of rest and fluid replacement. Other conditions that can cause bloody diarrhea include:  Internal bleeding.  Infection.  Diseases of the bowel and colon. Internal bleeding from an ulcer or bowel disease can be severe and requires hospital care or even surgery. DIAGNOSIS  To find out what is wrong your caregiver may check your:  Stool.  Blood.  Results from a test that looks inside the body (endoscopy). TREATMENT   Get plenty of rest.  Drink enough water and fluids to keep your urine clear or pale yellow.  Do not smoke.  Solid foods and dairy products should be avoided until your illness improves.  As you improve, slowly return to a regular diet with easily-digested foods first. Examples are:  Bananas.  Rice.  Toast.  Crackers. You should only need these for about 2 days before adding more normal foods to your diet.  Avoid spicy or fatty foods as well as caffeine and alcohol for several days.  Medicine to control cramping and diarrhea can relieve symptoms but may prolong some cases of bloody diarrhea. Antibiotics can speed recovery from diarrhea due to some bacterial infections.  Call your caregiver if diarrhea does not get better in 3 days. SEEK MEDICAL CARE IF:   You do not improve after 3 days.  Your diarrhea improves but your stool appears black. SEEK IMMEDIATE MEDICAL CARE IF:   You become extremely weak or faint.  You become very sweaty.  You have increased pain or bleeding.  You develop repeated vomiting.  You vomit and you see blood or the  vomit looks black in color.  You have a fever.   This information is not intended to replace advice given to you by your health care provider. Make sure you discuss any questions you have with your health care provider.   Document Released: 07/06/2005 Document Revised: 07/27/2014 Document Reviewed: 06/07/2009 Elsevier Interactive Patient Education [employer].

## 2015-10-26 NOTE — ED Notes (Signed)
Pt BIB EMS. Pt c/o RLQ pain for 3 days. N/V x 3 days. Rates pain 10/10. Pt has tenderness to palpation per EMS.
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
## 2015-10-26 NOTE — ED Notes (Signed)
Pt requesting pain medication. MD aware. No further orders received at this time.

## 2015-10-26 NOTE — ED Notes (Signed)
Patient transported to CT 

## 2015-11-12 ENCOUNTER — Emergency Department (HOSPITAL_COMMUNITY)
Admission: EM | Admit: 2015-11-12 | Discharge: 2015-11-12 | Disposition: A | Discharge status: Home / Self Care | Payer: Self-pay | Attending: Emergency Medicine | Admitting: Emergency Medicine

## 2015-11-12 ENCOUNTER — Encounter (HOSPITAL_COMMUNITY): Payer: Self-pay

## 2015-11-12 DIAGNOSIS — R109 Unspecified abdominal pain: Secondary | ICD-10-CM | POA: Insufficient documentation

## 2015-11-12 DIAGNOSIS — I252 Old myocardial infarction: Secondary | ICD-10-CM | POA: Insufficient documentation

## 2015-11-12 DIAGNOSIS — G8929 Other chronic pain: Secondary | ICD-10-CM | POA: Insufficient documentation

## 2015-11-12 LAB — CBC
HEMATOCRIT: 29.7 % — AB (ref 36.0–46.0)
HEMOGLOBIN: 9.1 g/dL — AB (ref 12.0–15.0)
MCH: 27 pg (ref 26.0–34.0)
MCHC: 30.6 g/dL (ref 30.0–36.0)
MCV: 88.1 fL (ref 78.0–100.0)
Platelets: 323 10*3/uL (ref 150–400)
RBC: 3.37 MIL/uL — AB (ref 3.87–5.11)
RDW: 21.1 % — ABNORMAL HIGH (ref 11.5–15.5)
WBC: 2.5 10*3/uL — ABNORMAL LOW (ref 4.0–10.5)

## 2015-11-12 LAB — COMPREHENSIVE METABOLIC PANEL
ALT: 76 U/L — ABNORMAL HIGH (ref 14–54)
ANION GAP: 15 (ref 5–15)
AST: 221 U/L — ABNORMAL HIGH (ref 15–41)
Albumin: 4.5 g/dL (ref 3.5–5.0)
Alkaline Phosphatase: 169 U/L — ABNORMAL HIGH (ref 38–126)
BUN: <5 mg/dL — ABNORMAL LOW (ref 6–20)
CHLORIDE: 105 mmol/L (ref 101–111)
CO2: 22 mmol/L (ref 22–32)
Calcium: 8.6 mg/dL — ABNORMAL LOW (ref 8.9–10.3)
Creatinine, Ser: 0.4 mg/dL — ABNORMAL LOW (ref 0.44–1.00)
GFR calc non Af Amer: >60 mL/min (ref >60)
Glucose, Bld: 95 mg/dL (ref 65–99)
Potassium: 3.5 mmol/L (ref 3.5–5.1)
SODIUM: 142 mmol/L (ref 135–145)
Total Bilirubin: 0.6 mg/dL (ref 0.3–1.2)
Total Protein: 7.9 g/dL (ref 6.5–8.1)

## 2015-11-12 LAB — LIPASE, BLOOD: LIPASE: 22 U/L (ref 11–51)

## 2015-11-12 LAB — I-STAT BETA HCG BLOOD, ED (MC, WL, AP ONLY): I-stat hCG, quantitative: <5 m[IU]/mL (ref <5)

## 2015-11-12 NOTE — ED Notes (Signed)
Patient called to update vitals, no answer.

## 2015-11-12 NOTE — ED Notes (Signed)
No answer when pt's name called in the waiting room 

## 2015-11-12 NOTE — ED Notes (Signed)
Per EMS, pt from home.  Pt with chronic abdominal pain.  Pt states abdominal pain x 6 days.  N/V/D.  No fever.  Vitals:  118/70, hr 90, resp 16, 98% ra

## 2015-12-07 ENCOUNTER — Encounter (HOSPITAL_COMMUNITY): Payer: Self-pay | Admitting: Oncology

## 2015-12-07 ENCOUNTER — Emergency Department (HOSPITAL_COMMUNITY): Payer: Self-pay

## 2015-12-07 ENCOUNTER — Emergency Department (HOSPITAL_COMMUNITY)
Admission: EM | Admit: 2015-12-07 | Discharge: 2015-12-08 | Disposition: A | Discharge status: Home / Self Care | Payer: Self-pay | Attending: Emergency Medicine | Admitting: Emergency Medicine

## 2015-12-07 DIAGNOSIS — Z792 Long term (current) use of antibiotics: Secondary | ICD-10-CM | POA: Insufficient documentation

## 2015-12-07 DIAGNOSIS — Y999 Unspecified external cause status: Secondary | ICD-10-CM | POA: Insufficient documentation

## 2015-12-07 DIAGNOSIS — S61511A Laceration without foreign body of right wrist, initial encounter: Secondary | ICD-10-CM | POA: Insufficient documentation

## 2015-12-07 DIAGNOSIS — F329 Major depressive disorder, single episode, unspecified: Secondary | ICD-10-CM | POA: Insufficient documentation

## 2015-12-07 DIAGNOSIS — Y939 Activity, unspecified: Secondary | ICD-10-CM | POA: Insufficient documentation

## 2015-12-07 DIAGNOSIS — Y929 Unspecified place or not applicable: Secondary | ICD-10-CM | POA: Insufficient documentation

## 2015-12-07 DIAGNOSIS — Z7982 Long term (current) use of aspirin: Secondary | ICD-10-CM | POA: Insufficient documentation

## 2015-12-07 DIAGNOSIS — W268XXA Contact with other sharp object(s), not elsewhere classified, initial encounter: Secondary | ICD-10-CM | POA: Insufficient documentation

## 2015-12-07 NOTE — ED Notes (Signed)
MD at bedside. Dr. Rogene Houston at bedside.

## 2015-12-07 NOTE — ED Notes (Signed)
Pt denies SI however continues to exhibit bizarre behavior.  Will d/w MD.

## 2015-12-07 NOTE — ED Notes (Signed)
D/w Dr. Rogene Houston who does not believe pt to be a danger to herself at this time.  Will treat laceration and d/c.

## 2015-12-07 NOTE — ED Notes (Signed)
Per EMS pt has a laceration to right wrist w/ adipose tissue exposed.  Per EMS pt reports that cut was one week ago however laceration appears fresh.  Pt exhibiting bizarre behavior in triage attempting to walk out the door prior to wound being closed.  Questionable suicide attempt.

## 2015-12-08 MED ORDER — BACITRACIN ZINC 500 UNIT/GM EX OINT
TOPICAL_OINTMENT | CUTANEOUS | Status: AC
  Filled 2015-12-08: qty 0.9
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
## 2015-12-08 NOTE — Discharge Instructions (Signed)
Wound care dressing wound daily with Polysporin Neosporin ointment. Wash with soap and water prior to that. And redress. Negative limited to follow-up with your regular doctor in the next few days for wound check. The wound be allowed to heal on its on since it is 35 days old. Return for any new or worse symptoms. X-ray showed no evidence of any foreign body.

## 2015-12-08 NOTE — ED Notes (Signed)
Pt walked out expressing she was not able to wait anymore.

## 2015-12-23 IMAGING — CR DG HAND COMPLETE 3+V*L*
3 series · 3 of 3 positions shown · non-contrast
Comparison: None.

CLINICAL DATA: Traumatic injury and pain

EXAM:
LEFT HAND - COMPLETE 3+ VIEW

[x hand pa left]
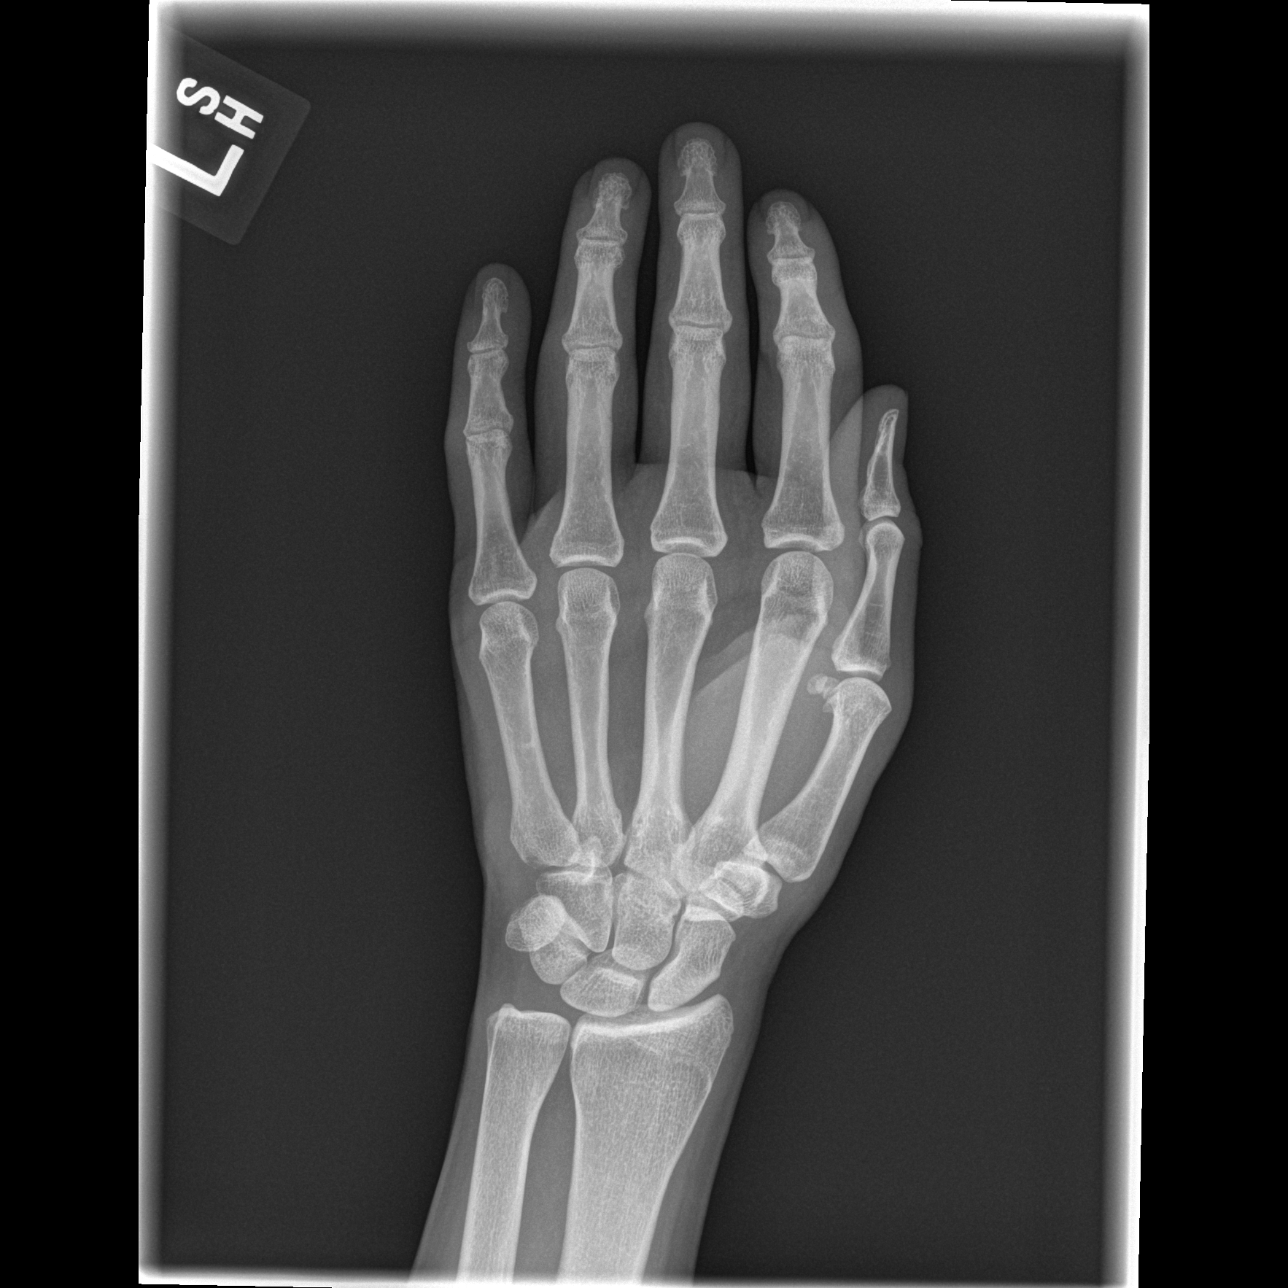

[x hand oblique left]
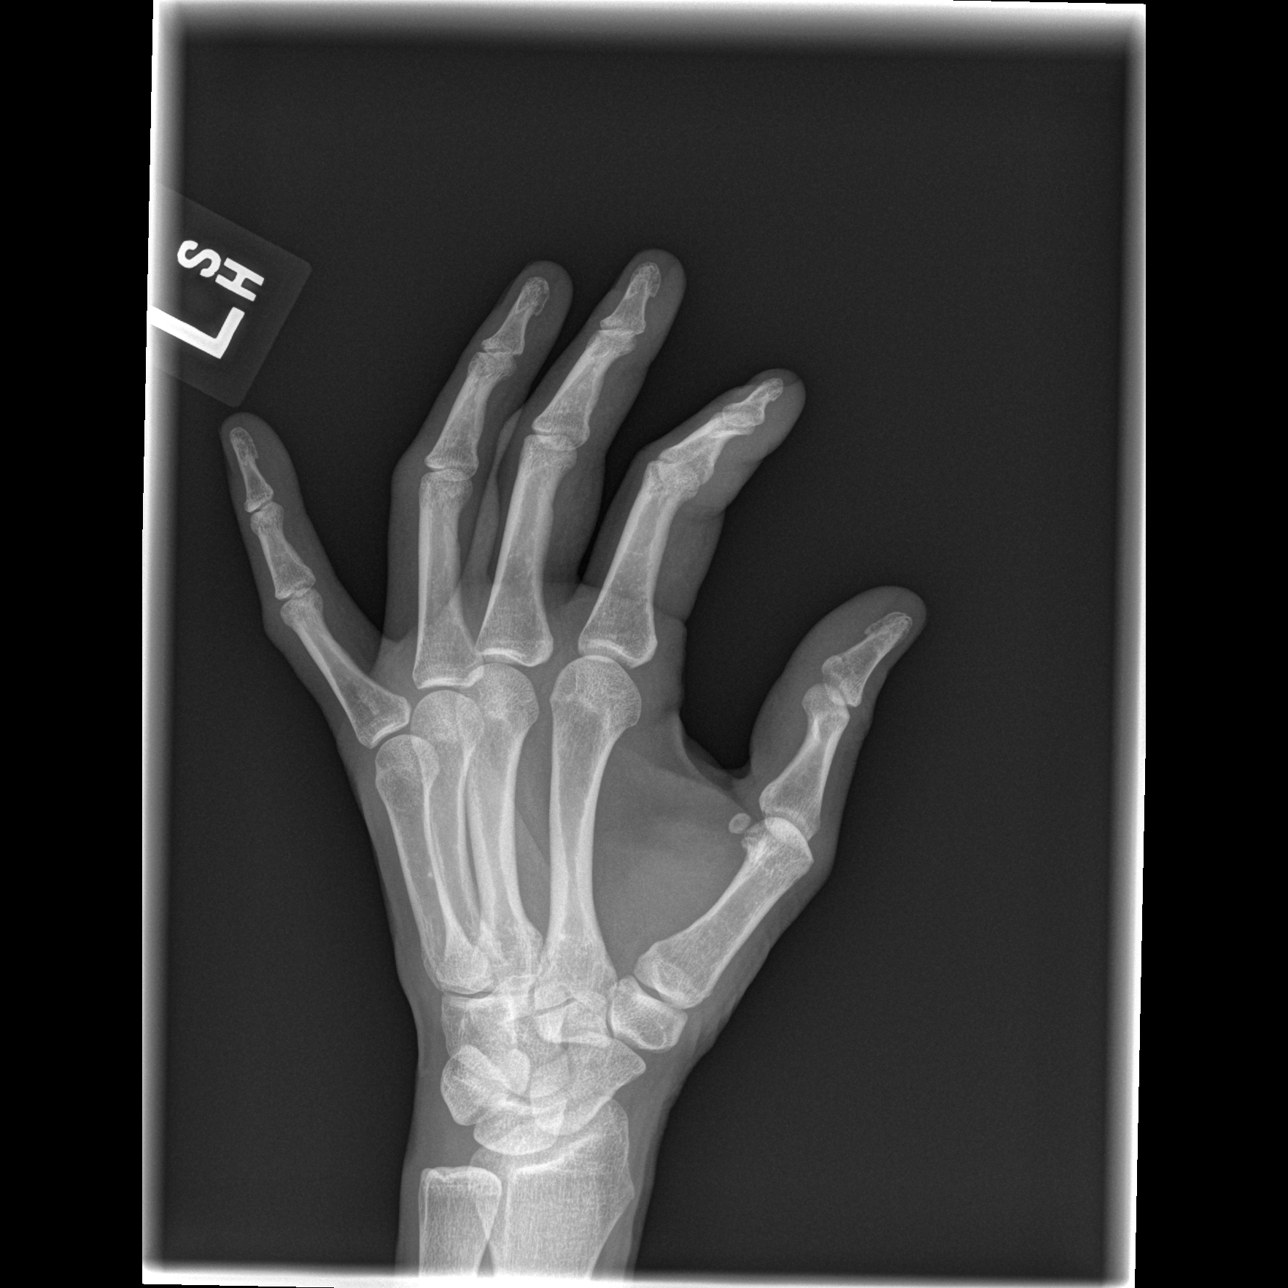

[x hand lat left]
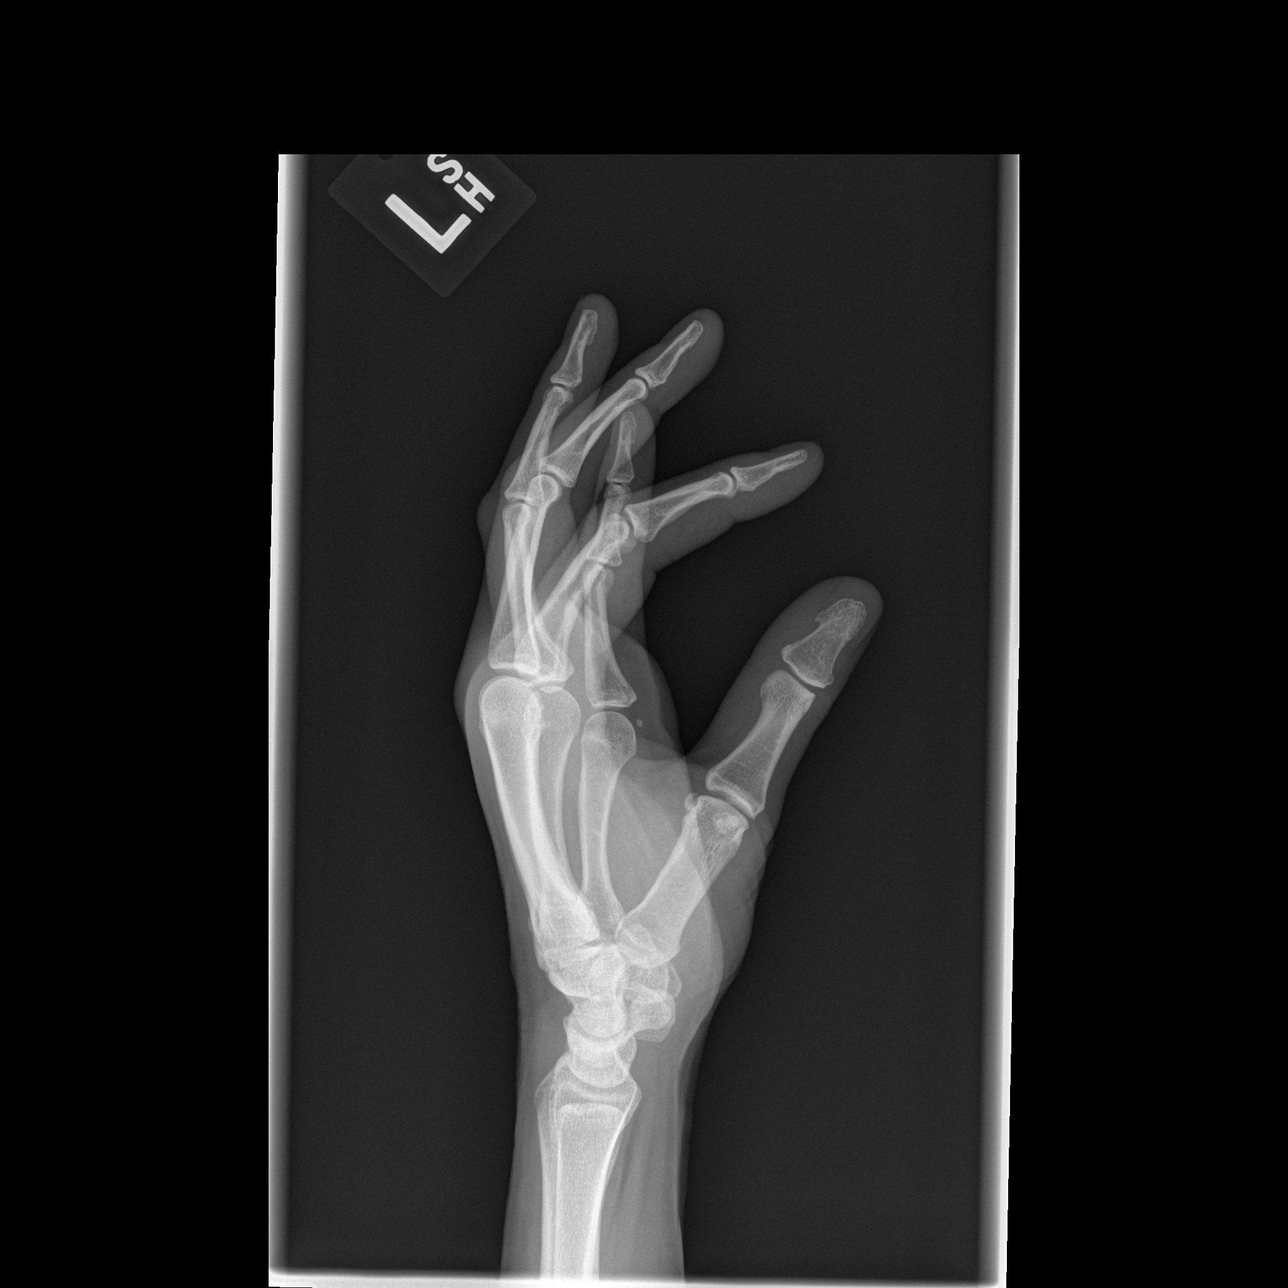

[3 of 3 positions shown; findings below may reference images not displayed]

FINDINGS: Mild degenerate changes are noted at the first metacarpophalangeal
joint. No acute fracture or dislocation is seen. No gross soft
tissue abnormality is noted.
IMPRESSION: Degenerative change without acute abnormality.

## 2016-01-06 ENCOUNTER — Encounter (HOSPITAL_COMMUNITY): Payer: Self-pay | Admitting: Emergency Medicine

## 2016-01-06 ENCOUNTER — Emergency Department (HOSPITAL_COMMUNITY): Payer: Self-pay

## 2016-01-06 ENCOUNTER — Emergency Department (HOSPITAL_COMMUNITY)
Admission: EM | Admit: 2016-01-06 | Discharge: 2016-01-06 | Disposition: A | Discharge status: Home / Self Care | Payer: Self-pay | Attending: Emergency Medicine | Admitting: Emergency Medicine

## 2016-01-06 DIAGNOSIS — F329 Major depressive disorder, single episode, unspecified: Secondary | ICD-10-CM | POA: Insufficient documentation

## 2016-01-06 DIAGNOSIS — Z79899 Other long term (current) drug therapy: Secondary | ICD-10-CM | POA: Insufficient documentation

## 2016-01-06 DIAGNOSIS — M62838 Other muscle spasm: Secondary | ICD-10-CM | POA: Insufficient documentation

## 2016-01-06 MED ORDER — IBUPROFEN 800 MG PO TABS
800.0000 mg | ORAL_TABLET | Freq: Once | ORAL | Status: AC
  Administered 2016-01-06: 800 mg via ORAL
  Filled 2016-01-06: qty 1

## 2016-01-06 MED ORDER — ACETAMINOPHEN 500 MG PO TABS
1000.0000 mg | ORAL_TABLET | Freq: Once | ORAL | Status: AC
  Administered 2016-01-06: 1000 mg via ORAL
  Filled 2016-01-06: qty 2

## 2016-01-06 MED ORDER — HYDROMORPHONE HCL 1 MG/ML IJ SOLN
0.5000 mg | Freq: Once | INTRAMUSCULAR | Status: AC
  Administered 2016-01-06: 0.5 mg via INTRAMUSCULAR
  Filled 2016-01-06: qty 1

## 2016-01-06 MED ORDER — KETOROLAC TROMETHAMINE 60 MG/2ML IM SOLN
60.0000 mg | Freq: Once | INTRAMUSCULAR | Status: AC
  Administered 2016-01-06: 60 mg via INTRAMUSCULAR
  Filled 2016-01-06: qty 2

## 2016-01-06 MED ORDER — DIAZEPAM 2 MG PO TABS: 2.0000 mg | ORAL_TABLET | Freq: Four times a day (QID) | ORAL | Status: DC | PRN

## 2016-01-06 MED ORDER — OXYCODONE HCL 5 MG PO TABS: 5.0000 mg | ORAL_TABLET | ORAL | Status: DC | PRN

## 2016-01-06 NOTE — ED Notes (Signed)
Pt transported from home by EMS with c/o back pain x 5 weeks, denies injury.

## 2016-01-06 NOTE — ED Provider Notes (Signed)
CSN: [id number]     Arrival date & time 01/06/16  0245 History  By signing my name below, I, Evelene Croon, attest that this documentation has been prepared under the direction and in the presence of Deno Etienne, DO . Electronically Signed: Evelene Croon, Scribe. 01/06/2016. 3:54 AM.   Chief Complaint  Patient presents with  . Back Pain   Patient is a 40 y.o. female presenting with back pain. The history is provided by the patient. No language interpreter was used.  Back Pain Location:  Thoracic spine Radiates to:  R shoulder Pain severity:  Moderate Onset quality:  Gradual Duration:  5 weeks Timing:  Constant Progression:  Waxing and waning Chronicity:  New Context: not recent injury   Relieved by:  Nothing Worsened by:  Sneezing Associated symptoms: no bladder incontinence, no bowel incontinence, no chest pain, no fever, no numbness and no weakness    HPI Comments:  Kristen Roberson is a 40 y.o. female brought in by ambulance, who presents to the Emergency Department complaining of 10/10, right upper back pain x 5.5 weeks. She denies injury. She notes her pain radiates to right shoulder and right neck. Her pain is exacerbated when sneezing and when she does reaching movements. No alleviating factors noted. Pt has no other complaints or symptoms at this time.   Past Medical History  Diagnosis Date  . Proctitis   . Cysts of both ovaries   . Anemia   . Anxiety   . Blood transfusion without reported diagnosis   . Depression   . Fatty liver 10/05/13  . Cardiac arrest ([place])   . Seizures ([place])   . Alcohol abuse    Past Surgical History  Procedure Laterality Date  . Ovarian cyst removal    . Laparoscopy N/A 09/28/2013    Procedure: LAPAROSCOPY OPERATIVE;  Surgeon: Terrance Mass, MD;  Location: [facility];  Service: Gynecology;  Laterality: N/A;  . Laparoscopic appendectomy Right 09/28/2013    Procedure: APPENDECTOMY LAPAROSCOPIC;  Surgeon: Terrance Mass, MD;  Location: [facility];  Service:  Gynecology;  Laterality: Right;  . Salpingoophorectomy Right 09/28/2013    Procedure: SALPINGO OOPHORECTOMY;  Surgeon: Terrance Mass, MD;  Location: [facility];  Service: Gynecology;  Laterality: Right;  . Colonoscopy N/A 09/30/2013    Procedure: COLONOSCOPY;  Surgeon: Lafayette Dragon, MD;  Location: [facility];  Service: Endoscopy;  Laterality: N/A;  . Esophagogastroduodenoscopy N/A 11/23/2013    Procedure: ESOPHAGOGASTRODUODENOSCOPY (EGD);  Surgeon: Jerene Bears, MD;  Location: [facility];  Service: Endoscopy;  Laterality: N/A;  . Appendectomy    . Left and right heart catheterization with coronary angiogram N/A 02/23/2014    Procedure: LEFT AND RIGHT HEART CATHETERIZATION WITH CORONARY ANGIOGRAM;  Surgeon: Leonie Man, MD;  Location: [facility];  Service: Cardiovascular;  Laterality: N/A;  . Colitis     Family History  Problem Relation Age of Onset  . Diabetes Mother   . Hyperlipidemia Mother   . Stroke Mother   . Diabetes Father    Social History  Substance Use Topics  . Smoking status: Never Smoker   . Smokeless tobacco: Never Used  . Alcohol Use: Yes     Comment: patient drinks occasinally   OB History    Gravida Para Term Preterm AB TAB SAB Ectopic Multiple Living   7 3   4  4   3      Review of Systems  Constitutional: Negative for fever and chills.  Respiratory: Negative  for shortness of breath.   Cardiovascular: Negative for chest pain.  Gastrointestinal: Negative for bowel incontinence.  Genitourinary: Negative for bladder incontinence.  Musculoskeletal: Positive for back pain.  Neurological: Negative for weakness and numbness.  All other systems reviewed and are negative.   Allergies  Morphine and related; Tramadol; and Penicillins  Home Medications   Prior to Admission medications   Medication Sig Start Date End Date Taking? Authorizing Provider  citalopram (CELEXA) 10 MG tablet Take 10 mg by mouth every evening.   Yes Historical Provider, MD  clonazePAM  (KLONOPIN) 0.5 MG tablet Take 0.5 mg by mouth 2 (two) times daily as needed for anxiety.   Yes Historical Provider, MD  carvedilol (COREG) 3.125 MG tablet Take 0.5 tablets (1.5625 mg total) by mouth 2 (two) times daily with a meal. Patient not taking: Reported on 09/07/2015 07/20/15   Benjamine Mola, FNP  clindamycin (CLEOCIN) 150 MG capsule Take 1 capsule (150 mg total) by mouth every 6 (six) hours. Patient not taking: Reported on 09/07/2015 09/04/15   Domenic Moras, PA-C  diazepam (VALIUM) 2 MG tablet Take 1 tablet (2 mg total) by mouth every 6 (six) hours as needed for anxiety. 01/06/16   Deno Etienne, DO  hydrOXYzine (ATARAX/VISTARIL) 25 MG tablet Take 1 tablet (25 mg total) by mouth every 8 (eight) hours as needed for anxiety. Patient not taking: Reported on 09/07/2015 09/04/15   Domenic Moras, PA-C  levETIRAcetam (KEPPRA) 1000 MG tablet Take 1 tablet (1,000 mg total) by mouth 2 (two) times daily. For seizure activities Patient not taking: Reported on 01/06/2016 07/20/15   Benjamine Mola, FNP  methocarbamol (ROBAXIN) 500 MG tablet Take 1 tablet (500 mg total) by mouth 2 (two) times daily. Patient not taking: Reported on 10/26/2015 10/11/15   April Palumbo, MD  ondansetron (ZOFRAN ODT) 4 MG disintegrating tablet Take 1 tablet (4 mg total) by mouth every 4 (four) hours as needed for nausea or vomiting. Patient not taking: Reported on 12/07/2015 10/26/15   Charlesetta Shanks, MD  oxyCODONE (ROXICODONE) 5 MG immediate release tablet Take 1 tablet (5 mg total) by mouth every 4 (four) hours as needed for severe pain. 01/06/16   Deno Etienne, DO  promethazine (PHENERGAN) 25 MG suppository Place 1 suppository (25 mg total) rectally every 6 (six) hours as needed for nausea or vomiting. Patient not taking: Reported on 12/07/2015 10/26/15   Charlesetta Shanks, MD  traZODone (DESYREL) 100 MG tablet Take 100 mg by mouth at bedtime as needed for sleep. Reported on 01/06/2016    Historical Provider, MD   BP 122/76 mmHg  Pulse 82  Temp(Src) 98  F (36.7 C) (Oral)  Resp 18  SpO2 98%  LMP 12/16/2015 Physical Exam  Constitutional: She is oriented to person, place, and time. She appears well-developed and well-nourished. No distress.  HENT:  Head: Normocephalic and atraumatic.  Eyes: EOM are normal. Pupils are equal, round, and reactive to light.  Neck: Normal range of motion. Neck supple.  Cardiovascular: Normal rate and regular rhythm.  Exam reveals no gallop and no friction rub.   No murmur heard. Pulmonary/Chest: Effort normal. She has no wheezes. She has no rales.  Abdominal: Soft. She exhibits no distension. There is no tenderness.  Musculoskeletal: She exhibits tenderness. She exhibits no edema.  Point tenderness to right rhomboid musculature around T6/7/8 and over right scapula   Neurological: She is alert and oriented to person, place, and time.  Skin: Skin is warm and dry. She is not diaphoretic.  Psychiatric: She has a normal mood and affect. Her behavior is normal.  Nursing note and vitals reviewed.   ED Course  Procedures   DIAGNOSTIC STUDIES:  Oxygen Saturation is 97% on RA, normal by my interpretation.    COORDINATION OF CARE:  3:35 AM Discussed treatment plan with pt at bedside and pt agreed to plan.  Labs Review Labs Reviewed - No data to display  Imaging Review Dg Chest 2 View  01/06/2016  CLINICAL DATA:  Chronic right upper back pain.  Initial encounter. EXAM: CHEST  2 VIEW COMPARISON:  Chest radiograph performed 10/10/2015 FINDINGS: The lungs are well-aerated and clear. There is no evidence of focal opacification, pleural effusion or pneumothorax. The heart is normal in size; the mediastinal contour is within normal limits. No acute osseous abnormalities are seen. Degenerative change is noted at the right acromioclavicular joint. IMPRESSION: No acute cardiopulmonary process seen. Electronically Signed   By: Garald Balding M.D.   On: 01/06/2016 04:07   I have personally reviewed and evaluated these  images and lab results as part of my medical decision-making.   EKG Interpretation None      MDM   Final diagnoses:  Trapezius muscle spasm    40 yo F with rhomboid strain. CXR performed due to length of symptoms.  Negative.  NSAID's and PCP follow up.   I personally performed the services described in this documentation, which was scribed in my presence. The recorded information has been reviewed and is accurate.  5:36 AM:  I have discussed the diagnosis/risks/treatment options with the patient and family and believe the pt to be eligible for discharge home to follow-up with PCP. We also discussed returning to the ED immediately if new or worsening sx occur. We discussed the sx which are most concerning (e.g., sudden worsening pain) that necessitate immediate return. Medications administered to the patient during their visit and any new prescriptions provided to the patient are listed below.  Medications given during this visit Medications  acetaminophen (TYLENOL) tablet 1,000 mg (1,000 mg Oral Given 01/06/16 0402)  ibuprofen (ADVIL,MOTRIN) tablet 800 mg (800 mg Oral Given 01/06/16 0402)  ketorolac (TORADOL) injection 60 mg (60 mg Intramuscular Given 01/06/16 0409)  HYDROmorphone (DILAUDID) injection 0.5 mg (0.5 mg Intramuscular Given 01/06/16 0405)    Discharge Medication List as of 01/06/2016  4:29 AM    START taking these medications   Details  diazepam (VALIUM) 2 MG tablet Take 1 tablet (2 mg total) by mouth every 6 (six) hours as needed for anxiety., Starting 01/06/2016, Until Discontinued, Print    oxyCODONE (ROXICODONE) 5 MG immediate release tablet Take 1 tablet (5 mg total) by mouth every 4 (four) hours as needed for severe pain., Starting 01/06/2016, Until Discontinued, Print        The patient appears reasonably screen and/or stabilized for discharge and I doubt any other medical condition or other [facility] requiring further screening, evaluation, or treatment in the ED at this  time prior to discharge.    Deno Etienne, DO 01/06/16 [phone]

## 2016-01-06 NOTE — Discharge Instructions (Signed)
Take 4 over the counter ibuprofen tablets 3 times a day or 2 over-the-counter naproxen tablets twice a day for pain. ° °

## 2016-05-20 ENCOUNTER — Emergency Department (HOSPITAL_COMMUNITY)
Admission: EM | Admit: 2016-05-20 | Discharge: 2016-05-21 | Disposition: A | Discharge status: Home / Self Care | Payer: Self-pay | Attending: Emergency Medicine | Admitting: Emergency Medicine

## 2016-05-20 ENCOUNTER — Emergency Department (HOSPITAL_COMMUNITY): Payer: Self-pay

## 2016-05-20 ENCOUNTER — Encounter (HOSPITAL_COMMUNITY): Payer: Self-pay | Admitting: Emergency Medicine

## 2016-05-20 DIAGNOSIS — Y9302 Activity, running: Secondary | ICD-10-CM | POA: Insufficient documentation

## 2016-05-20 DIAGNOSIS — M25561 Pain in right knee: Secondary | ICD-10-CM

## 2016-05-20 DIAGNOSIS — Y92009 Unspecified place in unspecified non-institutional (private) residence as the place of occurrence of the external cause: Secondary | ICD-10-CM | POA: Insufficient documentation

## 2016-05-20 DIAGNOSIS — Y999 Unspecified external cause status: Secondary | ICD-10-CM | POA: Insufficient documentation

## 2016-05-20 DIAGNOSIS — W19XXXA Unspecified fall, initial encounter: Secondary | ICD-10-CM

## 2016-05-20 DIAGNOSIS — S8001XA Contusion of right knee, initial encounter: Secondary | ICD-10-CM | POA: Insufficient documentation

## 2016-05-20 DIAGNOSIS — I5021 Acute systolic (congestive) heart failure: Secondary | ICD-10-CM | POA: Insufficient documentation

## 2016-05-20 DIAGNOSIS — Z79899 Other long term (current) drug therapy: Secondary | ICD-10-CM | POA: Insufficient documentation

## 2016-05-20 DIAGNOSIS — W01198A Fall on same level from slipping, tripping and stumbling with subsequent striking against other object, initial encounter: Secondary | ICD-10-CM | POA: Insufficient documentation

## 2016-05-20 NOTE — ED Notes (Signed)
Bed: [facility] Expected date:  Expected time:  Means of arrival:  Comments: EMS knee pain s/p fall

## 2016-05-20 NOTE — ED Triage Notes (Addendum)
Pt from home via EMS with complaints of right knee pain following a fall on Monday. Pt has not been ambulatory on the knee since then. Pt states she was playing with children and she tripped. Pt has obvious swelling to the knee. Pt is able to wiggle her toes. Pt states she was not seen for the injury. Pt was given 200 mcg of fentanyl in route.  Pt also states she has been drinking ETOH tonight

## 2016-05-21 MED ORDER — DOXYCYCLINE HYCLATE 100 MG PO TABS
100.0000 mg | ORAL_TABLET | Freq: Once | ORAL | Status: AC
  Administered 2016-05-21: 100 mg via ORAL
  Filled 2016-05-21: qty 1

## 2016-05-21 MED ORDER — NAPROXEN 500 MG PO TABS: 500.0000 mg | ORAL_TABLET | Freq: Two times a day (BID) | ORAL | 0 refills | Status: DC

## 2016-05-21 MED ORDER — DOXYCYCLINE HYCLATE 100 MG PO CAPS: 100.0000 mg | ORAL_CAPSULE | Freq: Two times a day (BID) | ORAL | 0 refills | Status: AC

## 2016-05-21 MED ORDER — KETOROLAC TROMETHAMINE 60 MG/2ML IM SOLN
60.0000 mg | Freq: Once | INTRAMUSCULAR | Status: AC
  Administered 2016-05-21: 60 mg via INTRAMUSCULAR
  Filled 2016-05-21: qty 2

## 2016-05-21 NOTE — ED Provider Notes (Signed)
[facility] DEPT Provider Note   CSN: [id number] Arrival date & time: 05/20/16  2302 By signing my name below, I, Dyke Brackett, attest that this documentation has been prepared under the direction and in the presence of non-physician practitioner, Gay Filler. PA-C  Electronically Signed: Dyke Brackett, Scribe. 05/21/2016. 2:00 AM.   History   Chief Complaint Chief Complaint  Patient presents with  . Fall  . Knee Pain    right    HPI Kristen Roberson is a 40 y.o. female with hx of substance abuse, anxiety who presents to the Emergency Department complaining of sudden onset right knee pain s/p fall two days ago. She states she was playing with her children when she ran through a pothole twisting her right knee. Pt describes her pain as shooting. No alleviating or modifying factors noted.  She has tried ibuprofen, ice packs and hot baths with no relief. She is not on any blood thinners. Pt denies any head injury, fever, numbness, or LOC.  The history is provided by the patient. No language interpreter was used.    Past Medical History:  Diagnosis Date  . Alcohol abuse   . Anemia   . Anxiety   . Blood transfusion without reported diagnosis   . Cardiac arrest ([place])   . Cysts of both ovaries   . Depression   . Fatty liver 10/05/13  . Proctitis   . Seizures [facility])     Patient Active Problem List   Diagnosis Date Noted  . Alcohol dependence with withdrawal, uncomplicated ([place]) [id number]  . Hepatic encephalopathy ([place]) 02/23/2015  . Seizure ([place]) 02/23/2015  . Pancytopenia ([place]) 02/23/2015  . Internal hemorrhoids 02/23/2015  . Alcohol intoxication ([place])   . Hemorrhoid   . C. difficile colitis   . Alcohol abuse   . Elevated liver enzymes   . Colitis 12/12/2014  . Abrasion of wrist   . Alcohol dependence with uncomplicated withdrawal ([place])   . GAD (generalized anxiety disorder) 07/01/2014  . Alcohol withdrawal syndrome without complication ([place]) 0000000  . MDD (major  depressive disorder), recurrent episode, moderate ([place])   . Alcohol use disorder, severe, dependence ([place])   . Alcohol dependence with withdrawal with complication ([place]) [id number]  . Thrombocytopenia ([place]) 05/07/2014  . Hypokalemia 05/07/2014  . Right sided weakness 04/15/2014  . Seizures ([place]) 04/04/2014  . Substance induced mood disorder ([place]) 03/26/2014  . Status post myocardial infarction 03/06/2014  . Depression 03/06/2014  . Substance abuse 02/22/2014  . Acute respiratory failure with hypoxia ([place]) 02/22/2014  . Ventricular fibrillation ([place]) 02/22/2014  . Acute systolic heart failure - s/p VF Cardiac Arrest 02/22/2014  . Acute encephalopathy 02/22/2014  . Acute confusional state 02/22/2014  . Moderate malnutrition ([place]) 02/21/2014  . Convulsions/seizures ([place]) 02/14/2014  . Cardiac arrest ([place]) 02/13/2014  . Alcohol dependence ([place]) 01/06/2014  . Severe alcohol use disorder ([place]) 01/06/2014  . Adjustment disorder with depressed mood 12/14/2013  . 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 Folate deficiency 12/11/2013  . Alcoholism ([place]) 12/11/2013  . Alcohol withdrawal ([place]) 12/11/2013  . Hypokalemia 12/11/2013  . Malnutrition of moderate degree ([place]) 12/10/2013  . Abdominal pain 11/25/2013  . Mallory-Weiss tear 11/25/2013  . Acute blood loss anemia 11/24/2013  . Hematemesis 11/23/2013  . Fatty liver 10/29/2013  . Internal hemorrhoids with other complication [id number]  . Hematochezia 10/28/2013  . Normocytic anemia 10/28/2013  . GIB (gastrointestinal bleeding) 10/28/2013  . Anxiety   . Post-operative state 09/29/2013  . Postoperative state 09/28/2013  .  Female pelvic pain 09/21/2013  . Menorrhagia 09/21/2013  . History of ovarian cyst 09/21/2013  . Proctitis 09/21/2013  . Dysmenorrhea 09/21/2013    Past Surgical History:  Procedure Laterality Date  . APPENDECTOMY    . colitis    . COLONOSCOPY N/A 09/30/2013   Procedure: COLONOSCOPY;  Surgeon: Lafayette Dragon, MD;  [facility]: [facility];  Service: Endoscopy;  Laterality: N/A;  . ESOPHAGOGASTRODUODENOSCOPY N/A 11/23/2013   Procedure: ESOPHAGOGASTRODUODENOSCOPY (EGD);  Surgeon: Jerene Bears, MD;  Location: [facility];  Service: Endoscopy;  Laterality: N/A;  . LAPAROSCOPIC APPENDECTOMY Right 09/28/2013   Procedure: APPENDECTOMY LAPAROSCOPIC;  Surgeon: Terrance Mass, MD;  Location: [facility];  Service: Gynecology;  Laterality: Right;  . LAPAROSCOPY N/A 09/28/2013   Procedure: LAPAROSCOPY OPERATIVE;  Surgeon: Terrance Mass, MD;  Location: [facility];  Service: Gynecology;  Laterality: N/A;  . LEFT AND RIGHT HEART CATHETERIZATION WITH CORONARY ANGIOGRAM N/A 02/23/2014   Procedure: LEFT AND RIGHT HEART CATHETERIZATION WITH CORONARY ANGIOGRAM;  Surgeon: Leonie Man, MD;  Location: [facility];  Service: Cardiovascular;  Laterality: N/A;  . OVARIAN CYST REMOVAL    . SALPINGOOPHORECTOMY Right 09/28/2013   Procedure: SALPINGO OOPHORECTOMY;  Surgeon: Terrance Mass, MD;  Location: [facility];  Service: Gynecology;  Laterality: Right;    OB History    Gravida Para Term Preterm AB Living   7 3     4 3    SAB TAB Ectopic Multiple Live Births   4               Home Medications    Prior to Admission medications   Medication Sig Start Date End Date Taking? Authorizing Provider  carvedilol (COREG) 3.125 MG tablet Take 0.5 tablets (1.5625 mg total) by mouth 2 (two) times daily with a meal. Patient not taking: Reported on 09/07/2015 07/20/15   Benjamine Mola, FNP  citalopram (CELEXA) 10 MG tablet Take 10 mg by mouth every evening.    Historical Provider, MD  clindamycin (CLEOCIN) 150 MG capsule Take 1 capsule (150 mg total) by mouth every 6 (six) hours. Patient not taking: Reported on 09/07/2015 09/04/15   Domenic Moras, PA-C  clonazePAM (KLONOPIN) 0.5 MG tablet Take 0.5 mg by mouth 2 (two) times daily as needed for anxiety.    Historical Provider, MD  diazepam (VALIUM) 2 MG tablet Take 1 tablet (2 mg total)  by mouth every 6 (six) hours as needed for anxiety. 01/06/16   Deno Etienne, DO  doxycycline (VIBRAMYCIN) 100 MG capsule Take 1 capsule (100 mg total) by mouth 2 (two) times daily. 05/21/16 05/28/16  Roxanna Mew, PA-C  hydrOXYzine (ATARAX/VISTARIL) 25 MG tablet Take 1 tablet (25 mg total) by mouth every 8 (eight) hours as needed for anxiety. Patient not taking: Reported on 09/07/2015 09/04/15   Domenic Moras, PA-C  levETIRAcetam (KEPPRA) 1000 MG tablet Take 1 tablet (1,000 mg total) by mouth 2 (two) times daily. For seizure activities Patient not taking: Reported on 01/06/2016 07/20/15   Benjamine Mola, FNP  methocarbamol (ROBAXIN) 500 MG tablet Take 1 tablet (500 mg total) by mouth 2 (two) times daily. Patient not taking: Reported on 10/26/2015 10/11/15   April Palumbo, MD  naproxen (NAPROSYN) 500 MG tablet Take 1 tablet (500 mg total) by mouth 2 (two) times daily. 05/21/16   Roxanna Mew, PA-C  ondansetron (ZOFRAN ODT) 4 MG disintegrating tablet Take 1 tablet (4 mg total) by mouth every 4 (four) hours as needed for nausea or  vomiting. Patient not taking: Reported on 12/07/2015 10/26/15   Charlesetta Shanks, MD  oxyCODONE (ROXICODONE) 5 MG immediate release tablet Take 1 tablet (5 mg total) by mouth every 4 (four) hours as needed for severe pain. 01/06/16   Deno Etienne, DO  promethazine (PHENERGAN) 25 MG suppository Place 1 suppository (25 mg total) rectally every 6 (six) hours as needed for nausea or vomiting. Patient not taking: Reported on 12/07/2015 10/26/15   Charlesetta Shanks, MD  traZODone (DESYREL) 100 MG tablet Take 100 mg by mouth at bedtime as needed for sleep. Reported on 01/06/2016    Historical Provider, MD    Family History Family History  Problem Relation Age of Onset  . Diabetes Mother   . Hyperlipidemia Mother   . Stroke Mother   . Diabetes Father     Social History Social History  Substance Use Topics  . Smoking status: Never Smoker  . Smokeless tobacco: Never Used  . Alcohol use Yes      Comment: patient drinks occasinally     Allergies   Morphine and related; Tramadol; and Penicillins   Review of Systems Review of Systems  Constitutional: Negative for fever.  Musculoskeletal: Positive for arthralgias, joint swelling and myalgias.  Skin: Positive for color change.  Neurological: Negative for syncope, numbness and headaches.    Physical Exam Updated Vital Signs BP 103/70 (BP Location: Left Arm)   Pulse 88   Temp 98.4 F (36.9 C) (Oral)   Resp 20   SpO2 100%   Physical Exam  Constitutional: She is oriented to person, place, and time. She appears well-developed and well-nourished. No distress.  HENT:  Head: Normocephalic and atraumatic.  Eyes: Conjunctivae are normal. No scleral icterus.  Neck: Normal range of motion.  Cardiovascular: Normal rate and intact distal pulses.   Pulmonary/Chest: Effort normal. No respiratory distress.  Abdominal: She exhibits no distension.  Musculoskeletal:       Right knee: She exhibits swelling. Tenderness found.  Right knee: Mild swelling and multiple bruises noted. No warmth or erythema. Diffusely tender. Limited ROM and strength testing secondary to pain; however, plantar/dorsiflexion strong and equal b/l; sensation intact distally; pulses intact distally.   Neurological: She is alert and oriented to person, place, and time.  Skin: Skin is warm and dry. Bruising noted. She is not diaphoretic.  Psychiatric: She has a normal mood and affect. Her behavior is normal.  Nursing note and vitals reviewed.  ED Treatments / Results  DIAGNOSTIC STUDIES:  Oxygen Saturation is 100% on RA, normal by my interpretation.    COORDINATION OF CARE:  1:58 AM Discussed treatment plan with pt at bedside and pt agreed to plan.  Labs (all labs ordered are listed, but only abnormal results are displayed) Labs Reviewed - No data to display  EKG  EKG Interpretation None      Radiology Dg Knee Complete 4 Views Right  Result Date:  05/20/2016 CLINICAL DATA:  [place] while running at home two days ago, striking her knee. EXAM: RIGHT KNEE - COMPLETE 4+ VIEW COMPARISON:  None. FINDINGS: Negative for acute fracture or dislocation. There is no bone lesion or bony destruction. No arthritic changes are evident. There are 2 metallic needle fragments in the soft tissues at the lateral aspect of the knee, superficial to the joint. IMPRESSION: Superficial soft tissue foreign bodies resembling needle fragments, lateral aspect of the knee. No fracture or dislocation. Electronically Signed   By: Andreas Newport M.D.   On: 05/20/2016 23:34  Procedures Procedures (including critical care time)  Medications Ordered in ED Medications  ketorolac (TORADOL) injection 60 mg (60 mg Intramuscular Given 05/21/16 0235)  doxycycline (VIBRA-TABS) tablet 100 mg (100 mg Oral Given 05/21/16 0445)    Initial Impression / Assessment and Plan / ED Course  I have reviewed the triage vital signs and the nursing notes.  Pertinent labs & imaging results that were available during my care of the patient were reviewed by me and considered in my medical decision making (see chart for details).  Clinical Course  Value Comment By Time  DG Knee Complete 4 Views Right 2 metallic needle fragments to lateral aspect of right knee.  Roxanna Mew, [place] 11/02 [id number]    Patient presents to ED with complaint of right knee pain s/p injury x 2 days ago. No head trauma or LOC. Patient is afebrile and non-toxic appearing in NAD. VSS. Physical exam remarkable for multiple bruises and mild swelling to right knee with diffuse TTP. Neurovascularly intact. X-ray remarkable for two needle fragments in lateral aspect of right knee, appear too deep for extraction in ED; no obvious fracture or dislocation. No puncture wound visualized on exam. Patient does not know how needle fragments came to be. Patient UTD on tetanus. Will start on ABX. Discussed patient wth Dr. Tamera Punt of  orthopedics, agrees to see patient in office on Friday, thank you for your time and continued care of this patient. Patient placed in knee sleeve and provided crutches. Conservative therapy discussed. Rx naprosyn and ABX. Return precautions given. Patient voiced understanding and is agreeable.    Final Clinical Impressions(s) / ED Diagnoses   Final diagnoses:  Fall, initial encounter  Acute pain of right knee    New Prescriptions New Prescriptions   DOXYCYCLINE (VIBRAMYCIN) 100 MG CAPSULE    Take 1 capsule (100 mg total) by mouth 2 (two) times daily.   NAPROXEN (NAPROSYN) 500 MG TABLET    Take 1 tablet (500 mg total) by mouth 2 (two) times daily.  I personally performed the services described in this documentation, which was scribed in my presence. The recorded information has been reviewed and is accurate.    Roxanna Mew, [place] 05/21/16 [id number]    April Palumbo, MD 05/21/16 [phone]

## 2016-05-21 NOTE — Discharge Instructions (Signed)
Read the information below.  Your x-ray was remarkable for needle fragments in the lateral part of your right knee. Your tetanus is UTD. You are being prescribed antibiotics.  There is no obvious fracture or dislocation. You may have a knee sprain. You are being placed in a knee sleeve and provided crutches. Ice and elevate knee for 20 minute increments.  I have prescribed naprosyn. While taking, do not take other NSAIDs (ibuprofen, motrin, aleve).  Use the prescribed medication as directed.  Please discuss all new medications with your pharmacist.   I spoke with Dr. Tamera Punt of Orthopedics. Recommends following up outpatient on Friday, please call in the morning to confirm follow up for Friday.  You may return to the Emergency Department at any time for worsening condition or any new symptoms that concern you.

## 2016-06-05 ENCOUNTER — Emergency Department (HOSPITAL_COMMUNITY)
Admission: EM | Admit: 2016-06-05 | Discharge: 2016-06-05 | Disposition: A | Discharge status: Home / Self Care | Payer: Self-pay

## 2016-06-05 NOTE — ED Notes (Signed)
Pt's fiancee came to front desk asking how long it will be before pt will be triaged. I stated that the medical staff is triaging everyone as quickly as they can and will get to pt as quickly as possible. Pt and fiancee then noted to be getting in car that pt was retrieved from w/out letting someone know they were leaving.

## 2016-06-05 NOTE — ED Notes (Signed)
No answer

## 2016-11-05 ENCOUNTER — Encounter (HOSPITAL_COMMUNITY): Payer: Self-pay | Admitting: Emergency Medicine

## 2016-11-05 ENCOUNTER — Emergency Department (HOSPITAL_COMMUNITY)
Admission: EM | Admit: 2016-11-05 | Discharge: 2016-11-06 | Disposition: A | Discharge status: Home / Self Care | Payer: Self-pay | Attending: Emergency Medicine | Admitting: Emergency Medicine

## 2016-11-05 DIAGNOSIS — I5021 Acute systolic (congestive) heart failure: Secondary | ICD-10-CM | POA: Insufficient documentation

## 2016-11-05 DIAGNOSIS — N3000 Acute cystitis without hematuria: Secondary | ICD-10-CM | POA: Insufficient documentation

## 2016-11-05 DIAGNOSIS — Z79899 Other long term (current) drug therapy: Secondary | ICD-10-CM | POA: Insufficient documentation

## 2016-11-05 DIAGNOSIS — I252 Old myocardial infarction: Secondary | ICD-10-CM | POA: Insufficient documentation

## 2016-11-05 DIAGNOSIS — R102 Pelvic and perineal pain: Secondary | ICD-10-CM | POA: Insufficient documentation

## 2016-11-05 LAB — COMPREHENSIVE METABOLIC PANEL
ALT: 15 U/L (ref 14–54)
AST: 36 U/L (ref 15–41)
Albumin: 4.4 g/dL (ref 3.5–5.0)
Alkaline Phosphatase: 86 U/L (ref 38–126)
Anion gap: 11 (ref 5–15)
BUN: 7 mg/dL (ref 6–20)
CHLORIDE: 105 mmol/L (ref 101–111)
CO2: 23 mmol/L (ref 22–32)
CREATININE: 0.56 mg/dL (ref 0.44–1.00)
Calcium: 8.3 mg/dL — ABNORMAL LOW (ref 8.9–10.3)
GFR calc Af Amer: >60 mL/min (ref >60)
GFR calc non Af Amer: >60 mL/min (ref >60)
Glucose, Bld: 110 mg/dL — ABNORMAL HIGH (ref 65–99)
Potassium: 3.7 mmol/L (ref 3.5–5.1)
SODIUM: 139 mmol/L (ref 135–145)
Total Bilirubin: 0.9 mg/dL (ref 0.3–1.2)
Total Protein: 8.2 g/dL — ABNORMAL HIGH (ref 6.5–8.1)

## 2016-11-05 LAB — CBC
HCT: 29.3 % — ABNORMAL LOW (ref 36.0–46.0)
Hemoglobin: 8.6 g/dL — ABNORMAL LOW (ref 12.0–15.0)
MCH: 20.3 pg — AB (ref 26.0–34.0)
MCHC: 29.4 g/dL — ABNORMAL LOW (ref 30.0–36.0)
MCV: 69.1 fL — AB (ref 78.0–100.0)
Platelets: 203 10*3/uL (ref 150–400)
RBC: 4.24 MIL/uL (ref 3.87–5.11)
RDW: 20.8 % — ABNORMAL HIGH (ref 11.5–15.5)
WBC: 3 10*3/uL — ABNORMAL LOW (ref 4.0–10.5)

## 2016-11-05 LAB — LIPASE, BLOOD: Lipase: 20 U/L (ref 11–51)

## 2016-11-05 LAB — HCG, QUANTITATIVE, PREGNANCY

## 2016-11-05 LAB — TYPE AND SCREEN
ABO/RH(D): O POS
ANTIBODY SCREEN: NEGATIVE

## 2016-11-05 NOTE — ED Provider Notes (Signed)
[facility] DEPT Provider Note   CSN: [id number] Arrival date & time: 11/05/16  2136    By signing my name below, I, Macon Large, attest that this documentation has been prepared under the direction and in the presence of Hiroto Saltzman A Marianne Golightly PA-C,. Electronically Signed: Macon Large, ED Scribe. 11/05/16. 12:17 AM.  History   Chief Complaint Chief Complaint  Patient presents with  . Abdominal Pain  . Rectal Bleeding  . Suicidal   The history is provided by the patient and the EMS personnel. No language interpreter was used.   HPI Comments: Kristen Roberson is a 41 y.o. female brought in by ambulance who presents to the Emergency Department complaining of gradually worsening, constant, generalized abdominal pain onset two days ago. Pt reports associated episodic nausea, vomiting, diarrhea accompanied by rectal bleeding. Per nurse note, pt has having ~4-5 episodes of diarrhea accompanied by emesis throughout the day. Per nurse note, pt states she passes "a cup full of blood normally" in her stool output. She also complains of intermittent chest pain and fatigue. Pt notes using hot baths at home with no relief. Per EMS note, EMS was called for suicidal ideations. Pt denied this upon EMS arrival at scene. Pt is currently denying this at this time. She denies vaginal discharge.   Past Medical History:  Diagnosis Date  . Alcohol abuse   . Anemia   . Anxiety   . Blood transfusion without reported diagnosis   . Cardiac arrest ([place])   . Cysts of both ovaries   . Depression   . Fatty liver 10/05/13  . Proctitis   . Seizures [facility])     Patient Active Problem List   Diagnosis Date Noted  . Alcohol dependence with withdrawal, uncomplicated ([place]) 27/25/3664  . Hepatic encephalopathy ([place]) 02/23/2015  . Seizure ([place]) 02/23/2015  . Pancytopenia ([place]) 02/23/2015  . Internal hemorrhoids 02/23/2015  . Alcohol intoxication ([place])   . Hemorrhoid   . C. difficile colitis   . Alcohol abuse     . Elevated liver enzymes   . Colitis 12/12/2014  . Abrasion of wrist   . Alcohol dependence with uncomplicated withdrawal ([place])   . GAD (generalized anxiety disorder) 07/01/2014  . Alcohol withdrawal syndrome without complication ([place]) 40/34/7425  . MDD (major depressive disorder), recurrent episode, moderate ([place])   . Alcohol use disorder, severe, dependence ([place])   . Alcohol dependence with withdrawal with complication ([place]) 95/63/8756  . Thrombocytopenia ([place]) 05/07/2014  . Hypokalemia 05/07/2014  . Right sided weakness 04/15/2014  . Seizures ([place]) 04/04/2014  . Substance induced mood disorder ([place]) 03/26/2014  . Status post myocardial infarction 03/06/2014  . Depression 03/06/2014  . Substance abuse 02/22/2014  . Acute respiratory failure with hypoxia ([place]) 02/22/2014  . Ventricular fibrillation ([place]) 02/22/2014  . Acute systolic heart failure - s/p VF Cardiac Arrest 02/22/2014  . Acute encephalopathy 02/22/2014  . Acute confusional state 02/22/2014  . Moderate malnutrition ([place]) 02/21/2014  . Convulsions/seizures ([place]) 02/14/2014  . Cardiac arrest ([place]) 02/13/2014  . Alcohol dependence ([place]) 01/06/2014  . Severe alcohol use disorder ([place]) 01/06/2014  . Adjustment disorder with depressed mood 12/14/2013  . 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 Folate deficiency 12/11/2013  . Alcoholism ([place]) 12/11/2013  . Alcohol withdrawal ([place]) 12/11/2013  . Hypokalemia 12/11/2013  . Malnutrition of moderate degree ([place]) 12/10/2013  . Abdominal pain 11/25/2013  . Mallory-Weiss tear 11/25/2013  . Acute blood loss anemia 11/24/2013  . Hematemesis 11/23/2013  . Fatty liver 10/29/2013  .  Internal hemorrhoids with other complication 57/84/6962  . Hematochezia 10/28/2013  . Normocytic anemia 10/28/2013  . GIB (gastrointestinal bleeding) 10/28/2013  . Anxiety   . Post-operative state 09/29/2013  . Postoperative state 09/28/2013  . Female pelvic pain 09/21/2013  .  Menorrhagia 09/21/2013  . History of ovarian cyst 09/21/2013  . Proctitis 09/21/2013  . Dysmenorrhea 09/21/2013    Past Surgical History:  Procedure Laterality Date  . APPENDECTOMY    . colitis    . COLONOSCOPY N/A 09/30/2013   Procedure: COLONOSCOPY;  Surgeon: Lafayette Dragon, MD;  Location: [facility];  Service: Endoscopy;  Laterality: N/A;  . ESOPHAGOGASTRODUODENOSCOPY N/A 11/23/2013   Procedure: ESOPHAGOGASTRODUODENOSCOPY (EGD);  Surgeon: Jerene Bears, MD;  Location: [facility];  Service: Endoscopy;  Laterality: N/A;  . LAPAROSCOPIC APPENDECTOMY Right 09/28/2013   Procedure: APPENDECTOMY LAPAROSCOPIC;  Surgeon: Terrance Mass, MD;  Location: [facility];  Service: Gynecology;  Laterality: Right;  . LAPAROSCOPY N/A 09/28/2013   Procedure: LAPAROSCOPY OPERATIVE;  Surgeon: Terrance Mass, MD;  Location: [facility];  Service: Gynecology;  Laterality: N/A;  . LEFT AND RIGHT HEART CATHETERIZATION WITH CORONARY ANGIOGRAM N/A 02/23/2014   Procedure: LEFT AND RIGHT HEART CATHETERIZATION WITH CORONARY ANGIOGRAM;  Surgeon: Leonie Man, MD;  Location: [facility] CATH LAB;  Service: Cardiovascular;  Laterality: N/A;  . OVARIAN CYST REMOVAL    . SALPINGOOPHORECTOMY Right 09/28/2013   Procedure: SALPINGO OOPHORECTOMY;  Surgeon: Terrance Mass, MD;  Location: [facility];  Service: Gynecology;  Laterality: Right;    OB History    Gravida Para Term Preterm AB Living   7 3     4 3    SAB TAB Ectopic Multiple Live Births   4               Home Medications    Prior to Admission medications   Medication Sig Start Date End Date Taking? Authorizing Provider  FLUoxetine (PROZAC) 20 MG tablet Take 20 mg by mouth daily.   Yes Historical Provider, MD  carvedilol (COREG) 3.125 MG tablet Take 0.5 tablets (1.5625 mg total) by mouth 2 (two) times daily with a meal. Patient not taking: Reported on 09/07/2015 07/20/15   Benjamine Mola, FNP  clindamycin (CLEOCIN) 150 MG capsule Take 1 capsule (150 mg total) by mouth every 6  (six) hours. Patient not taking: Reported on 09/07/2015 09/04/15   Domenic Moras, PA-C  diazepam (VALIUM) 2 MG tablet Take 1 tablet (2 mg total) by mouth every 6 (six) hours as needed for anxiety. Patient not taking: Reported on 11/05/2016 01/06/16   Deno Etienne, DO  hydrOXYzine (ATARAX/VISTARIL) 25 MG tablet Take 1 tablet (25 mg total) by mouth every 8 (eight) hours as needed for anxiety. Patient not taking: Reported on 09/07/2015 09/04/15   Domenic Moras, PA-C  levETIRAcetam (KEPPRA) 1000 MG tablet Take 1 tablet (1,000 mg total) by mouth 2 (two) times daily. For seizure activities Patient not taking: Reported on 01/06/2016 07/20/15   Benjamine Mola, FNP  methocarbamol (ROBAXIN) 500 MG tablet Take 1 tablet (500 mg total) by mouth 2 (two) times daily. Patient not taking: Reported on 10/26/2015 10/11/15   April Palumbo, MD  naproxen (NAPROSYN) 500 MG tablet Take 1 tablet (500 mg total) by mouth 2 (two) times daily. Patient not taking: Reported on 11/05/2016 05/21/16   Frederica Kuster, PA-C  ondansetron (ZOFRAN ODT) 4 MG disintegrating tablet Take 1 tablet (4 mg total) by mouth every 4 (four) hours as needed for nausea or vomiting. Patient  not taking: Reported on 12/07/2015 10/26/15   Charlesetta Shanks, MD  oxyCODONE (ROXICODONE) 5 MG immediate release tablet Take 1 tablet (5 mg total) by mouth every 4 (four) hours as needed for severe pain. Patient not taking: Reported on 11/05/2016 01/06/16   Deno Etienne, DO  promethazine (PHENERGAN) 25 MG suppository Place 1 suppository (25 mg total) rectally every 6 (six) hours as needed for nausea or vomiting. Patient not taking: Reported on 12/07/2015 10/26/15   Charlesetta Shanks, MD    Family History Family History  Problem Relation Age of Onset  . Diabetes Mother   . Hyperlipidemia Mother   . Stroke Mother   . Diabetes Father     Social History Social History  Substance Use Topics  . Smoking status: Never Smoker  . Smokeless tobacco: Never Used  . Alcohol use Yes     Comment:  patient drinks occasinally     Allergies   Morphine and related; Tramadol; and Penicillins   Review of Systems Review of Systems  Constitutional: Positive for fatigue.  Cardiovascular: Positive for chest pain.  Gastrointestinal: Positive for abdominal pain, anal bleeding, diarrhea, nausea and vomiting.  Genitourinary: Negative for vaginal discharge.     Physical Exam Updated Vital Signs BP 119/84 (BP Location: Left Arm)   Pulse 80   Temp 97.9 F (36.6 C) (Oral)   Resp (!) 21   SpO2 100%   Physical Exam  Constitutional: She is oriented to person, place, and time. She appears well-developed and well-nourished.  HENT:  Head: Normocephalic.  Mouth/Throat: Oropharynx is clear and moist.  Neck: Normal range of motion. Neck supple.  Cardiovascular: Normal rate and regular rhythm.   Pulmonary/Chest: Effort normal and breath sounds normal. She has no wheezes. She has no rales.  Abdominal: Soft. Bowel sounds are normal. There is tenderness (Diffuse abdominal tenderness. ). There is no rebound and no guarding.  Musculoskeletal: Normal range of motion.  Neurological: She is alert and oriented to person, place, and time.  Skin: Skin is warm and dry. No rash noted.  Psychiatric: She has a normal mood and affect.     ED Treatments / Results   DIAGNOSTIC STUDIES: Oxygen Saturation is 100% on RA, normal by my interpretation.    COORDINATION OF CARE: 11:42 PM Discussed treatment plan with pt at bedside which includes labs, pain medication and pt agreed to plan.   Labs (all labs ordered are listed, but only abnormal results are displayed) Labs Reviewed  COMPREHENSIVE METABOLIC PANEL - Abnormal; Notable for the following:       Result Value   Glucose, Bld 110 (*)    Calcium 8.3 (*)    Total Protein 8.2 (*)    All other components within normal limits  CBC - Abnormal; Notable for the following:    WBC 3.0 (*)    Hemoglobin 8.6 (*)    HCT 29.3 (*)    MCV 69.1 (*)    MCH  20.3 (*)    MCHC 29.4 (*)    RDW 20.8 (*)    All other components within normal limits  LIPASE, BLOOD  HCG, QUANTITATIVE, PREGNANCY  URINALYSIS, ROUTINE W REFLEX MICROSCOPIC  POC OCCULT BLOOD, ED  TYPE AND SCREEN    EKG  EKG Interpretation None       Radiology No results found.  Procedures Procedures (including critical care time)  Medications Ordered in ED Medications - No data to display   Initial Impression / Assessment and Plan / ED Course  I  have reviewed the triage vital signs and the nursing notes.  Pertinent labs & imaging results that were available during my care of the patient were reviewed by me and considered in my medical decision making (see chart for details).     Patient complains of abdominal pain worse than her usual. She is given Hyoscyamine, benadryl, and Reglan with improvement. She is found to have a urinary tract infection and is given Rocephin, started on Keflex.   She is feeling better. No evidence for sepsis. She is considered appropriate for discharge home.    Final Clinical Impressions(s) / ED Diagnoses   Final diagnoses:  None   1. UTI  New Prescriptions New Prescriptions   No medications on file    I personally performed the services described in this documentation, which was scribed in my presence. The recorded information has been reviewed and is accurate.      Charlann Lange, PA-C 11/13/16 [place], MD 11/14/16 [phone]

## 2016-11-05 NOTE — ED Notes (Signed)
Bed: WA04 Expected date:  Expected time:  Means of arrival:  Comments: EMS 14 F abdominal pain n/v

## 2016-11-05 NOTE — ED Triage Notes (Addendum)
Pt from home with complaints of nausea, vomiting and abdominal pain x 2 days. Pt states the pain is mostly on the right upper side. Pt states she has had 4-5 episodes of emesis and 4-5 episodes of diarrhea. Pt also has complaints of rectal bleeding x "a long time". Pt states she passes "cup fulls of blood normally".  Per pt she has had 1 beer today. Pt's visitor states she has had more.   Per EMS the call initially came out as SI. Pt denied this when they arrived on the scene. Pt states to this RN that she does have thoughts of harming her self. Pt states this is due to her abdominal pain and "missing her children" Pt states she has no current plan. Pt has hx of suicide attempt. Per visitor, pt was planning on going to [facility] today for detox

## 2016-11-05 NOTE — ED Notes (Signed)
Pt aware that a urine sample is needed but unable at this time.

## 2016-11-06 LAB — URINALYSIS, ROUTINE W REFLEX MICROSCOPIC
Bilirubin Urine: NEGATIVE
Glucose, UA: NEGATIVE mg/dL
HGB URINE DIPSTICK: NEGATIVE
Ketones, ur: NEGATIVE mg/dL
Nitrite: POSITIVE — AB
PH: 5 (ref 5.0–8.0)
Protein, ur: NEGATIVE mg/dL
SPECIFIC GRAVITY, URINE: 1.009 (ref 1.005–1.030)

## 2016-11-06 MED ORDER — DEXTROSE 5 % IV SOLN
1.0000 g | Freq: Once | INTRAVENOUS | Status: AC
  Administered 2016-11-06: 1 g via INTRAVENOUS
  Filled 2016-11-06: qty 10

## 2016-11-06 MED ORDER — METOCLOPRAMIDE HCL 5 MG/ML IJ SOLN
10.0000 mg | Freq: Once | INTRAMUSCULAR | Status: AC
  Administered 2016-11-06: 10 mg via INTRAVENOUS
  Filled 2016-11-06: qty 2

## 2016-11-06 MED ORDER — PHENAZOPYRIDINE HCL 200 MG PO TABS: 200.0000 mg | ORAL_TABLET | Freq: Three times a day (TID) | ORAL | 0 refills | Status: DC

## 2016-11-06 MED ORDER — CEPHALEXIN 500 MG PO CAPS: 500.0000 mg | ORAL_CAPSULE | Freq: Four times a day (QID) | ORAL | 0 refills | Status: DC

## 2016-11-06 MED ORDER — HYOSCYAMINE SULFATE 0.5 MG/ML IJ SOLN
0.1250 mg | Freq: Once | INTRAMUSCULAR | Status: AC
  Administered 2016-11-06: 0.125 mg via INTRAVENOUS
  Filled 2016-11-06: qty 0.25

## 2016-11-06 MED ORDER — DIPHENHYDRAMINE HCL 50 MG/ML IJ SOLN
12.5000 mg | Freq: Once | INTRAMUSCULAR | Status: AC
  Administered 2016-11-06: 12.5 mg via INTRAVENOUS
  Filled 2016-11-06: qty 1

## 2016-11-22 ENCOUNTER — Encounter (HOSPITAL_COMMUNITY): Payer: Self-pay | Admitting: Emergency Medicine

## 2016-11-22 ENCOUNTER — Emergency Department (HOSPITAL_COMMUNITY)
Admission: EM | Admit: 2016-11-22 | Discharge: 2016-11-22 | Disposition: A | Discharge status: Home / Self Care | Payer: Self-pay | Attending: Emergency Medicine | Admitting: Emergency Medicine

## 2016-11-22 ENCOUNTER — Emergency Department (HOSPITAL_COMMUNITY): Payer: Self-pay

## 2016-11-22 DIAGNOSIS — R112 Nausea with vomiting, unspecified: Secondary | ICD-10-CM | POA: Insufficient documentation

## 2016-11-22 DIAGNOSIS — R1030 Lower abdominal pain, unspecified: Secondary | ICD-10-CM | POA: Insufficient documentation

## 2016-11-22 DIAGNOSIS — R110 Nausea: Secondary | ICD-10-CM

## 2016-11-22 LAB — COMPREHENSIVE METABOLIC PANEL
ALBUMIN: 4.5 g/dL (ref 3.5–5.0)
ALK PHOS: 77 U/L (ref 38–126)
ALT: 14 U/L (ref 14–54)
ANION GAP: 10 (ref 5–15)
AST: 29 U/L (ref 15–41)
BUN: 8 mg/dL (ref 6–20)
CALCIUM: 8.3 mg/dL — AB (ref 8.9–10.3)
CHLORIDE: 108 mmol/L (ref 101–111)
CO2: 20 mmol/L — AB (ref 22–32)
Creatinine, Ser: 0.59 mg/dL (ref 0.44–1.00)
GFR calc non Af Amer: >60 mL/min (ref >60)
GLUCOSE: 98 mg/dL (ref 65–99)
POTASSIUM: 3.7 mmol/L (ref 3.5–5.1)
SODIUM: 138 mmol/L (ref 135–145)
Total Bilirubin: 0.2 mg/dL — ABNORMAL LOW (ref 0.3–1.2)
Total Protein: 7.7 g/dL (ref 6.5–8.1)

## 2016-11-22 LAB — MAGNESIUM: Magnesium: 2 mg/dL (ref 1.7–2.4)

## 2016-11-22 LAB — URINALYSIS, ROUTINE W REFLEX MICROSCOPIC
Bilirubin Urine: NEGATIVE
Glucose, UA: NEGATIVE mg/dL
HGB URINE DIPSTICK: NEGATIVE
Ketones, ur: NEGATIVE mg/dL
Leukocytes, UA: NEGATIVE
NITRITE: NEGATIVE
PH: 6 (ref 5.0–8.0)
Protein, ur: NEGATIVE mg/dL
SPECIFIC GRAVITY, URINE: 1.001 — AB (ref 1.005–1.030)

## 2016-11-22 LAB — CBC
HCT: 26.9 % — ABNORMAL LOW (ref 36.0–46.0)
HEMOGLOBIN: 8.1 g/dL — AB (ref 12.0–15.0)
MCH: 21.6 pg — AB (ref 26.0–34.0)
MCHC: 30.1 g/dL (ref 30.0–36.0)
MCV: 71.7 fL — ABNORMAL LOW (ref 78.0–100.0)
Platelets: 183 10*3/uL (ref 150–400)
RBC: 3.75 MIL/uL — AB (ref 3.87–5.11)
RDW: 21.8 % — ABNORMAL HIGH (ref 11.5–15.5)
WBC: 3.4 10*3/uL — ABNORMAL LOW (ref 4.0–10.5)

## 2016-11-22 LAB — I-STAT BETA HCG BLOOD, ED (MC, WL, AP ONLY)

## 2016-11-22 LAB — LIPASE, BLOOD: LIPASE: 32 U/L (ref 11–51)

## 2016-11-22 MED ORDER — KETOROLAC TROMETHAMINE 30 MG/ML IJ SOLN
30.0000 mg | Freq: Once | INTRAMUSCULAR | Status: AC
  Administered 2016-11-22: 30 mg via INTRAVENOUS
  Filled 2016-11-22: qty 1

## 2016-11-22 MED ORDER — DICYCLOMINE HCL 20 MG PO TABS: 20.0000 mg | ORAL_TABLET | Freq: Two times a day (BID) | ORAL | 0 refills | Status: DC

## 2016-11-22 MED ORDER — IOPAMIDOL (ISOVUE-300) INJECTION 61%
INTRAVENOUS | Status: AC
  Administered 2016-11-22: 100 mL via INTRAVENOUS
  Filled 2016-11-22: qty 100

## 2016-11-22 MED ORDER — SODIUM CHLORIDE 0.9 % IV BOLUS (SEPSIS)
1000.0000 mL | Freq: Once | INTRAVENOUS | Status: AC
  Administered 2016-11-22: 1000 mL via INTRAVENOUS

## 2016-11-22 MED ORDER — HYDROXYZINE HCL 25 MG PO TABS: 25.0000 mg | ORAL_TABLET | Freq: Four times a day (QID) | ORAL | 0 refills | Status: DC

## 2016-11-22 MED ORDER — ONDANSETRON 4 MG PO TBDP: 4.0000 mg | ORAL_TABLET | Freq: Three times a day (TID) | ORAL | 0 refills | Status: DC | PRN

## 2016-11-22 NOTE — ED Triage Notes (Signed)
Pt comes from home with complaints of abdominal pain for the past 4 days.  Was seen on 4/19 for same and was diagnosed for a UTI.  Pt was unable to fill her prescriptions given on that visit. Pt states she has not been able to keep anything down.  Symptoms have gotten worse. Vitals WNL.

## 2016-11-22 NOTE — ED Notes (Signed)
Patient stated that she was diagnosed with uti last time and was not able to afford her medicine so that is why she is back.

## 2016-11-22 NOTE — ED Notes (Signed)
Pt continuously trying to get out of triage chair. Told multiple times to stay in chair.  Call bell within reach.

## 2016-11-22 NOTE — ED Notes (Signed)
Patient taken to bathroom to give sample.

## 2016-11-22 NOTE — Discharge Instructions (Signed)

## 2016-11-22 NOTE — ED Provider Notes (Signed)
Emergency Department Provider Note   I have reviewed the triage vital signs and the nursing notes.   HISTORY  Chief Complaint Abdominal Pain   HPI Kristen Roberson is a 41 y.o. female presents to the ED for evaluation abdominal pain and vomiting. Patient has had symptoms for the last 2 weeks. Reports symptoms intermittent and worse with anxiety symptoms. Patient with similar symptoms in the past. No diarrhea. No fever or chills. No CP or SOB. Symptoms are severe and non-radiating.    Past Medical History:  Diagnosis Date  . Alcohol abuse   . Anemia   . Anxiety   . Blood transfusion without reported diagnosis   . Cardiac arrest ([place])   . Cysts of both ovaries   . Depression   . Fatty liver 10/05/13  . Proctitis   . Seizures [facility])     Patient Active Problem List   Diagnosis Date Noted  . Alcohol dependence with withdrawal, uncomplicated ([place]) 93/23/5573  . Hepatic encephalopathy ([place]) 02/23/2015  . Seizure ([place]) 02/23/2015  . Pancytopenia ([place]) 02/23/2015  . Internal hemorrhoids 02/23/2015  . Alcohol intoxication ([place])   . Hemorrhoid   . C. difficile colitis   . Alcohol abuse   . Elevated liver enzymes   . Colitis 12/12/2014  . Abrasion of wrist   . Alcohol dependence with uncomplicated withdrawal ([place])   . GAD (generalized anxiety disorder) 07/01/2014  . Alcohol withdrawal syndrome without complication ([place]) 22/08/5425  . MDD (major depressive disorder), recurrent episode, moderate ([place])   . Alcohol use disorder, severe, dependence ([place])   . Alcohol dependence with withdrawal with complication ([place]) 01/10/7627  . Thrombocytopenia ([place]) 05/07/2014  . Hypokalemia 05/07/2014  . Right sided weakness 04/15/2014  . Seizures ([place]) 04/04/2014  . Substance induced mood disorder ([place]) 03/26/2014  . Status post myocardial infarction 03/06/2014  . Depression 03/06/2014  . Substance abuse 02/22/2014  . Acute respiratory failure with hypoxia ([place]) 02/22/2014  . Ventricular  fibrillation ([place]) 02/22/2014  . Acute systolic heart failure - s/p VF Cardiac Arrest 02/22/2014  . Acute encephalopathy 02/22/2014  . Acute confusional state 02/22/2014  . Moderate malnutrition ([place]) 02/21/2014  . Convulsions/seizures ([place]) 02/14/2014  . Cardiac arrest ([place]) 02/13/2014  . Alcohol dependence ([place]) 01/06/2014  . Severe alcohol use disorder ([place]) 01/06/2014  . Adjustment disorder with depressed mood 12/14/2013  . Alcoholic peripheral neuropathy ([place]) 12/11/2013  . Folate deficiency 12/11/2013  . Alcoholism ([place]) 12/11/2013  . Alcohol withdrawal ([place]) 12/11/2013  . Hypokalemia 12/11/2013  . Malnutrition of moderate degree ([place]) 12/10/2013  . Abdominal pain 11/25/2013  . Mallory-Weiss tear 11/25/2013  . Acute blood loss anemia 11/24/2013  . Hematemesis 11/23/2013  . Fatty liver 10/29/2013  . Internal hemorrhoids with other complication 31/51/7616  . Hematochezia 10/28/2013  . Normocytic anemia 10/28/2013  . GIB (gastrointestinal bleeding) 10/28/2013  . Anxiety   . Post-operative state 09/29/2013  . Postoperative state 09/28/2013  . Female pelvic pain 09/21/2013  . Menorrhagia 09/21/2013  . History of ovarian cyst 09/21/2013  . Proctitis 09/21/2013  . Dysmenorrhea 09/21/2013    Past Surgical History:  Procedure Laterality Date  . APPENDECTOMY    . colitis    . COLONOSCOPY N/A 09/30/2013   Procedure: COLONOSCOPY;  Surgeon: Lafayette Dragon, MD;  [facility];  Service: Endoscopy;  Laterality: N/A;  . ESOPHAGOGASTRODUODENOSCOPY N/A 11/23/2013   Procedure: ESOPHAGOGASTRODUODENOSCOPY (EGD);  Surgeon: Jerene Bears, MD;  Location: [facility];  Service: Endoscopy;  Laterality: N/A;  . LAPAROSCOPIC  APPENDECTOMY Right 09/28/2013   Procedure: APPENDECTOMY LAPAROSCOPIC;  Surgeon: Terrance Mass, MD;  Location: [facility];  Service: Gynecology;  Laterality: Right;  . LAPAROSCOPY N/A 09/28/2013   Procedure: LAPAROSCOPY OPERATIVE;  Surgeon: Terrance Mass, MD;   Location: [facility];  Service: Gynecology;  Laterality: N/A;  . LEFT AND RIGHT HEART CATHETERIZATION WITH CORONARY ANGIOGRAM N/A 02/23/2014   Procedure: LEFT AND RIGHT HEART CATHETERIZATION WITH CORONARY ANGIOGRAM;  Surgeon: Leonie Man, MD;  Location: [facility] CATH LAB;  Service: Cardiovascular;  Laterality: N/A;  . OVARIAN CYST REMOVAL    . SALPINGOOPHORECTOMY Right 09/28/2013   Procedure: SALPINGO OOPHORECTOMY;  Surgeon: Terrance Mass, MD;  Location: [facility];  Service: Gynecology;  Laterality: Right;    Current Outpatient Rx  . Order #: [id number] Class: Historical Med  . Order #: [id number] Class: Historical Med  . Order #: [id number] Class: Historical Med  . Order #: [id number] Class: Historical Med  . Order #: [id number] Class: No Print  . Order #: [id number] Class: Print  . Order #: [id number] Class: Print  . Order #: [id number] Class: Print  . Order #: [id number] Class: Print  . Order #: [id number] Class: Print  . Order #: [id number] Class: Print  . Order #: [id number] Class: Print  . Order #: [id number] Class: Print  . Order #: [id number] Class: Print  . Order #: [id number] Class: Print  . Order #: [id number] Class: Print  . Order #: [id number] Class: Print    Allergies Morphine and related; Tramadol; and Penicillins  Family History  Problem Relation Age of Onset  . Diabetes Mother   . Hyperlipidemia Mother   . Stroke Mother   . Diabetes Father     Social History Social History  Substance Use Topics  . Smoking status: Never Smoker  . Smokeless tobacco: Never Used  . Alcohol use Yes     Comment: drinks wine    Review of Systems  Constitutional: No fever/chills Eyes: No visual changes. ENT: No sore throat. Cardiovascular: Denies chest pain. Respiratory: Denies shortness of breath. Gastrointestinal: Positive abdominal pain. Positive nausea and vomiting.  No diarrhea.  No constipation. Genitourinary: Negative for dysuria. Musculoskeletal: Negative for back pain. Skin: Negative for  rash. Neurological: Negative for headaches, focal weakness or numbness.  10-point ROS otherwise negative.  ____________________________________________   PHYSICAL EXAM:  VITAL SIGNS: ED Triage Vitals  Enc Vitals Group     BP 11/22/16 1845 (!) 133/92     Pulse Rate 11/22/16 1845 99     Resp 11/22/16 1845 16     Temp 11/22/16 1845 98.9 F (37.2 C)     Temp Source 11/22/16 1845 Oral     SpO2 11/22/16 1845 96 %     Weight 11/22/16 1844 145 lb (65.8 kg)     Height 11/22/16 1844 5\' 6"  (1.676 m)     Pain Score 11/22/16 1842 10   Constitutional: Alert and oriented. Patient very labile. She is screaming in pain and then calm and polite and then screaming again.  Eyes: Conjunctivae are normal. Head: Atraumatic. Nose: No congestion/rhinnorhea. Mouth/Throat: Mucous membranes are moist. Neck: No stridor.  Cardiovascular: Normal rate, regular rhythm. Good peripheral circulation. Grossly normal heart sounds.   Respiratory: Normal respiratory effort.  No retractions. Lungs CTAB. Gastrointestinal: Soft with mild diffuse tenderness. No distention.  Musculoskeletal: No lower extremity tenderness nor edema. No gross deformities of extremities. Neurologic:  Normal speech and language. No gross focal neurologic deficits are appreciated.  Skin:  Skin is warm, dry and intact. No rash noted. Psychiatric: Mood and affect are labile. Speech and  behavior are agitated.   ____________________________________________   LABS (all labs ordered are listed, but only abnormal results are displayed)  Labs Reviewed  COMPREHENSIVE METABOLIC PANEL - Abnormal; Notable for the following:       Result Value   CO2 20 (*)    Calcium 8.3 (*)    Total Bilirubin 0.2 (*)    All other components within normal limits  CBC - Abnormal; Notable for the following:    WBC 3.4 (*)    RBC 3.75 (*)    Hemoglobin 8.1 (*)    HCT 26.9 (*)    MCV 71.7 (*)    MCH 21.6 (*)    RDW 21.8 (*)    All other components within  normal limits  URINALYSIS, ROUTINE W REFLEX MICROSCOPIC - Abnormal; Notable for the following:    Color, Urine COLORLESS (*)    Specific Gravity, Urine 1.001 (*)    All other components within normal limits  LIPASE, BLOOD  MAGNESIUM  I-STAT BETA HCG BLOOD, ED ([facility], [facility], [facility] ONLY)   ____________________________________________  RADIOLOGY  Ct Abdomen Pelvis W Contrast  Result Date: 11/22/2016 CLINICAL DATA:  Acute onset of generalized abdominal pain. Initial encounter. EXAM: CT ABDOMEN AND PELVIS WITH CONTRAST TECHNIQUE: Multidetector CT imaging of the abdomen and pelvis was performed using the standard protocol following bolus administration of intravenous contrast. CONTRAST:  100 mL ISOVUE-300 IOPAMIDOL (ISOVUE-300) INJECTION 61% COMPARISON:  CT of the abdomen and pelvis from 10/26/2015 FINDINGS: Lower chest: The visualized lung bases are grossly clear. The visualized portions of the mediastinum are unremarkable. Hepatobiliary: The liver is unremarkable in appearance. The gallbladder is unremarkable in appearance. The common bile duct remains normal in caliber. Pancreas: The pancreas is within normal limits. Spleen: The spleen is unremarkable in appearance. Adrenals/Urinary Tract: The adrenal glands are unremarkable in appearance. The kidneys are within normal limits. There is no evidence of hydronephrosis. No renal or ureteral stones are identified. No perinephric stranding is seen. Stomach/Bowel: The stomach is unremarkable in appearance. The small bowel is within normal limits. The patient is status post appendectomy. The colon is unremarkable in appearance. Vascular/Lymphatic: The abdominal aorta is unremarkable in appearance. The inferior vena cava is grossly unremarkable. No retroperitoneal lymphadenopathy is seen. No pelvic sidewall lymphadenopathy is identified. Reproductive: The bladder is mildly distended and within normal limits. The uterus is grossly unremarkable in appearance. The ovaries are  relatively symmetric. No suspicious adnexal masses are seen. Other: No additional soft tissue abnormalities are seen. Musculoskeletal: No acute osseous abnormalities are identified. There is grade 2 anterolisthesis of L5 on S1, with underlying chronic bilateral pars defects at L5. The visualized musculature is unremarkable in appearance. IMPRESSION: 1. No acute abnormality seen within the abdomen or pelvis. 2. Grade 2 retrolisthesis of L5 on S1, with underlying chronic bilateral pars defects at L5. Electronically Signed   By: Garald Balding M.D.   On: 11/22/2016 21:32    ____________________________________________   PROCEDURES  Procedure(s) performed:   Procedures  None ____________________________________________   INITIAL IMPRESSION / ASSESSMENT AND PLAN / ED COURSE  Pertinent labs & imaging results that were available during my care of the patient were reviewed by me and considered in my medical decision making (see chart for details).  Patient presents to the ED with abdominal pain with nausea and vomiting. Patient's behavior is bizarre and mood is very labile. With significant difficulty obtaining reliable exam a CT scan and labs were obtained. No UTI. Normal CT abdomen/pelvis. Patient feeling much better  after Toradol. Tolerating PO and ambulatory prior to discharge.  At this time, I do not feel there is any life-threatening condition present. I have reviewed and discussed all results (EKG, imaging, lab, urine as appropriate), exam findings with patient. I have reviewed nursing notes and appropriate previous records.  I feel the patient is safe to be discharged home without further emergent workup. Discussed usual and customary return precautions. Patient and family (if present) verbalize understanding and are comfortable with this plan.  Patient will follow-up with their primary care provider. If they do not have a primary care provider, information for follow-up has been provided to  them. All questions have been answered.  ____________________________________________  FINAL CLINICAL IMPRESSION(S) / ED DIAGNOSES  Final diagnoses:  Lower abdominal pain  Nausea  Non-intractable vomiting with nausea, unspecified vomiting type     MEDICATIONS GIVEN DURING THIS VISIT:  Medications  ketorolac (TORADOL) 30 MG/ML injection 30 mg (30 mg Intravenous Given 11/22/16 2028)  sodium chloride 0.9 % bolus 1,000 mL (0 mLs Intravenous Stopped 11/22/16 2307)  iopamidol (ISOVUE-300) 61 % injection (100 mLs Intravenous Contrast Given 11/22/16 2041)     NEW OUTPATIENT MEDICATIONS STARTED DURING THIS VISIT:  Discharge Medication List as of 11/22/2016 10:56 PM    START taking these medications   Details  dicyclomine (BENTYL) 20 MG tablet Take 1 tablet (20 mg total) by mouth 2 (two) times daily., Starting Sun 11/22/2016, Print        Note:  This document was prepared using Dragon voice recognition software and may include unintentional dictation errors.  Nanda Quinton, MD Emergency Medicine   Sadia Belfiore, Wonda Olds, MD 11/23/16 [phone]

## 2016-11-22 NOTE — ED Notes (Signed)
Patient stuck twice with no success. 

## 2016-11-22 NOTE — ED Notes (Signed)
Bed: [facility]37 Expected date:  Expected time:  Means of arrival:  Comments: [place]

## 2016-11-29 ENCOUNTER — Encounter (HOSPITAL_COMMUNITY): Payer: Self-pay | Admitting: Emergency Medicine

## 2016-11-29 ENCOUNTER — Emergency Department (HOSPITAL_COMMUNITY): Payer: Self-pay

## 2016-11-29 ENCOUNTER — Emergency Department (HOSPITAL_COMMUNITY)
Admission: EM | Admit: 2016-11-29 | Discharge: 2016-11-29 | Disposition: A | Discharge status: Home / Self Care | Payer: Self-pay | Attending: Emergency Medicine | Admitting: Emergency Medicine

## 2016-11-29 DIAGNOSIS — Y929 Unspecified place or not applicable: Secondary | ICD-10-CM | POA: Insufficient documentation

## 2016-11-29 DIAGNOSIS — S8002XA Contusion of left knee, initial encounter: Secondary | ICD-10-CM | POA: Insufficient documentation

## 2016-11-29 DIAGNOSIS — Y939 Activity, unspecified: Secondary | ICD-10-CM | POA: Insufficient documentation

## 2016-11-29 DIAGNOSIS — Z79899 Other long term (current) drug therapy: Secondary | ICD-10-CM | POA: Insufficient documentation

## 2016-11-29 DIAGNOSIS — Y999 Unspecified external cause status: Secondary | ICD-10-CM | POA: Insufficient documentation

## 2016-11-29 MED ORDER — KETOROLAC TROMETHAMINE 30 MG/ML IJ SOLN
15.0000 mg | Freq: Once | INTRAMUSCULAR | Status: AC
  Administered 2016-11-29: 15 mg via INTRAVENOUS
  Filled 2016-11-29: qty 1

## 2016-11-29 MED ORDER — HYDROCODONE-ACETAMINOPHEN 5-325 MG PO TABS: 1.0000 | ORAL_TABLET | ORAL | 0 refills | Status: DC | PRN

## 2016-11-29 MED ORDER — IBUPROFEN 600 MG PO TABS: 600.0000 mg | ORAL_TABLET | Freq: Four times a day (QID) | ORAL | 0 refills | Status: DC | PRN
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
## 2016-11-30 ENCOUNTER — Emergency Department (HOSPITAL_COMMUNITY)
Admission: EM | Admit: 2016-11-30 | Discharge: 2016-11-30 | Disposition: A | Discharge status: Home / Self Care | Payer: Self-pay | Attending: Emergency Medicine | Admitting: Emergency Medicine

## 2016-11-30 ENCOUNTER — Emergency Department (HOSPITAL_COMMUNITY): Payer: Self-pay

## 2016-11-30 DIAGNOSIS — Z79899 Other long term (current) drug therapy: Secondary | ICD-10-CM | POA: Insufficient documentation

## 2016-11-30 DIAGNOSIS — S40029A Contusion of unspecified upper arm, initial encounter: Secondary | ICD-10-CM | POA: Insufficient documentation

## 2016-11-30 DIAGNOSIS — Y9389 Activity, other specified: Secondary | ICD-10-CM | POA: Insufficient documentation

## 2016-11-30 DIAGNOSIS — I5021 Acute systolic (congestive) heart failure: Secondary | ICD-10-CM | POA: Insufficient documentation

## 2016-11-30 DIAGNOSIS — S0083XA Contusion of other part of head, initial encounter: Secondary | ICD-10-CM | POA: Insufficient documentation

## 2016-11-30 DIAGNOSIS — R51 Headache: Secondary | ICD-10-CM | POA: Insufficient documentation

## 2016-11-30 DIAGNOSIS — Y999 Unspecified external cause status: Secondary | ICD-10-CM | POA: Insufficient documentation

## 2016-11-30 DIAGNOSIS — I252 Old myocardial infarction: Secondary | ICD-10-CM | POA: Insufficient documentation

## 2016-11-30 DIAGNOSIS — Y9289 Other specified places as the place of occurrence of the external cause: Secondary | ICD-10-CM | POA: Insufficient documentation

## 2016-11-30 DIAGNOSIS — S8002XA Contusion of left knee, initial encounter: Secondary | ICD-10-CM | POA: Insufficient documentation

## 2016-11-30 MED ORDER — KETOROLAC TROMETHAMINE 60 MG/2ML IM SOLN
30.0000 mg | Freq: Once | INTRAMUSCULAR | Status: AC
  Administered 2016-11-30: 30 mg via INTRAMUSCULAR
  Filled 2016-11-30: qty 2

## 2016-11-30 MED ORDER — ETODOLAC 300 MG PO CAPS: 300.0000 mg | ORAL_CAPSULE | Freq: Three times a day (TID) | ORAL | 0 refills | Status: DC

## 2016-11-30 MED ORDER — IBUPROFEN 400 MG PO TABS
400.0000 mg | ORAL_TABLET | Freq: Once | ORAL | Status: AC
  Administered 2016-11-30: 400 mg via ORAL
  Filled 2016-11-30: qty 1
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
## 2016-11-30 NOTE — ED Notes (Signed)
Patient given taxi voucher. 

## 2016-11-30 NOTE — ED Notes (Signed)
Patient at xray

## 2016-11-30 NOTE — ED Notes (Signed)
Patient transported to CT 

## 2016-11-30 NOTE — Progress Notes (Signed)
Orthopedic Tech Progress Note Patient Details:  NABILA ALBARRACIN 12-30-1975 [id number]  Ortho Devices Type of Ortho Device: Crutches, Knee Immobilizer Ortho Device/Splint Location: applied knee immobilizer to pt left leg/knee.  pt tolerated well.  pt previously used crutches and ambulated well.   Left knee/leg. Ortho Device/Splint Interventions: Application, Adjustment   Kristopher Oppenheim 11/30/2016, 10:38 PM
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
## 2016-11-30 NOTE — Discharge Instructions (Signed)
Ice to help with the swelling, take the medications as needed for pain, follow up with an orthopedic doctor
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
## 2016-12-02 ENCOUNTER — Encounter: Payer: Self-pay | Admitting: Gynecology

## 2017-01-07 ENCOUNTER — Emergency Department (HOSPITAL_COMMUNITY)
Admission: EM | Admit: 2017-01-07 | Discharge: 2017-01-07 | Disposition: A | Discharge status: Home / Self Care | Payer: Self-pay | Attending: Emergency Medicine | Admitting: Emergency Medicine

## 2017-01-07 ENCOUNTER — Emergency Department (HOSPITAL_COMMUNITY): Payer: Self-pay

## 2017-01-07 ENCOUNTER — Encounter (HOSPITAL_COMMUNITY): Payer: Self-pay | Admitting: Emergency Medicine

## 2017-01-07 DIAGNOSIS — Z79899 Other long term (current) drug therapy: Secondary | ICD-10-CM | POA: Insufficient documentation

## 2017-01-07 DIAGNOSIS — J029 Acute pharyngitis, unspecified: Secondary | ICD-10-CM | POA: Insufficient documentation

## 2017-01-07 DIAGNOSIS — R0981 Nasal congestion: Secondary | ICD-10-CM | POA: Insufficient documentation

## 2017-01-07 DIAGNOSIS — M791 Myalgia: Secondary | ICD-10-CM | POA: Insufficient documentation

## 2017-01-07 DIAGNOSIS — B9789 Other viral agents as the cause of diseases classified elsewhere: Secondary | ICD-10-CM | POA: Insufficient documentation

## 2017-01-07 DIAGNOSIS — J069 Acute upper respiratory infection, unspecified: Secondary | ICD-10-CM | POA: Insufficient documentation

## 2017-01-07 DIAGNOSIS — I5021 Acute systolic (congestive) heart failure: Secondary | ICD-10-CM | POA: Insufficient documentation

## 2017-01-07 MED ORDER — GUAIFENESIN ER 600 MG PO TB12
600.0000 mg | ORAL_TABLET | Freq: Two times a day (BID) | ORAL | 0 refills | Status: DC
Start: 2017-01-07 — End: 2017-01-19

## 2017-01-07 MED ORDER — PHENOL 1.4 % MT LIQD: 1.0000 | Freq: Once | OROMUCOSAL | Status: DC

## 2017-01-07 MED ORDER — ACETAMINOPHEN 325 MG PO TABS
650.0000 mg | ORAL_TABLET | Freq: Once | ORAL | Status: AC
  Administered 2017-01-07: 650 mg via ORAL
  Filled 2017-01-07: qty 2

## 2017-01-07 MED ORDER — ACETAMINOPHEN 325 MG PO TABS: 650.0000 mg | ORAL_TABLET | Freq: Three times a day (TID) | ORAL | 0 refills | Status: DC | PRN

## 2017-01-07 MED ORDER — PHENOL 1.4 % MT LIQD
1.0000 | OROMUCOSAL | Status: DC | PRN
  Administered 2017-01-07: 1 via OROMUCOSAL
  Filled 2017-01-07: qty 177

## 2017-01-07 MED ORDER — GUAIFENESIN ER 600 MG PO TB12
600.0000 mg | ORAL_TABLET | Freq: Once | ORAL | Status: AC
  Administered 2017-01-07: 600 mg via ORAL
  Filled 2017-01-07: qty 1

## 2017-01-07 NOTE — Discharge Instructions (Addendum)
Your chest xray looks good or normal.   It appears that you have a viral upper respiratory infection (cold).  Cold symptoms can last up to 2 weeks.    - Get plenty of rest and drink plenty of fluids. - Try to breathe moist air. Use a humidifier or take a steamy shower. - Consume warm fluids (soup or tea) to provide relief for a stuffy nose and to loosen phlegm. - For nasal stuffiness, try saline nasal spray or a Neti Pot. - For sore throat pain relief: suck on throat lozenges, hard candy or popsicles; gargle with warm salt water (1/4 tsp. salt per 8 oz. of water); and eat soft, bland foods. - Eat a well-balanced diet. If you cannot, ensure you are getting enough nutrients by taking a daily multivitamin. - Avoid dairy products, as they can thicken phlegm. - Avoid alcohol, as it impairs your body?s immune system.  CONTACT YOUR DOCTOR IF YOU EXPERIENCE ANY OF THE FOLLOWING: - High fever - Ear pain - Sinus-type headache - Unusually severe cold symptoms - Cough that gets worse while other cold symptoms improve - Flare up of any chronic lung problem, such as asthma - Your symptoms persist longer than 2 weeks

## 2017-01-07 NOTE — ED Provider Notes (Signed)
[facility] DEPT Provider Note   CSN: [id number] Arrival date & time: 01/07/17  0849   History   Chief Complaint Chief Complaint  Patient presents with  . Cough  . Nasal Congestion   HPI Kristen Roberson is a 41 y.o. female.  Patient reports a 4 day h/o of productive cough, SOB, wheeze, myalgia, sore throat, NB diarrhea.  She reports frontal headache that is worse with coughing.  She has been using Excedrin and Halls for symptoms with little improvement.  She also has been doing salt water gargles with little improvement in her symptoms.  She reports several sick contacts at work with similar symptoms.  She is not on any medications.  She denies nausea, vomiting.  She has been hydrating as much as possible.  Denies tobacco use, ETOH use or drug use.       Past Medical History:  Diagnosis Date  . Alcohol abuse   . Anemia   . Anxiety   . Blood transfusion without reported diagnosis   . Cardiac arrest ([place])   . Cysts of both ovaries   . Depression   . Fatty liver 10/05/13  . Proctitis   . Seizures [facility])     Patient Active Problem List   Diagnosis Date Noted  . Alcohol dependence with withdrawal, uncomplicated ([place]) 05/13/8526  . Hepatic encephalopathy ([place]) 02/23/2015  . Seizure ([place]) 02/23/2015  . Pancytopenia ([place]) 02/23/2015  . Internal hemorrhoids 02/23/2015  . Alcohol intoxication ([place])   . Hemorrhoid   . C. difficile colitis   . Alcohol abuse   . Elevated liver enzymes   . Colitis 12/12/2014  . Abrasion of wrist   . Alcohol dependence with uncomplicated withdrawal ([place])   . GAD (generalized anxiety disorder) 07/01/2014  . Alcohol withdrawal syndrome without complication ([place]) 78/24/2353  . MDD (major depressive disorder), recurrent episode, moderate ([place])   . Alcohol use disorder, severe, dependence ([place])   . Alcohol dependence with withdrawal with complication ([place]) 61/44/3154  . Thrombocytopenia ([place]) 05/07/2014  . Hypokalemia 05/07/2014  . Right sided  weakness 04/15/2014  . Seizures ([place]) 04/04/2014  . Substance induced mood disorder ([place]) 03/26/2014  . Status post myocardial infarction 03/06/2014  . Depression 03/06/2014  . Substance abuse 02/22/2014  . Acute respiratory failure with hypoxia ([place]) 02/22/2014  . Ventricular fibrillation ([place]) 02/22/2014  . Acute systolic heart failure - s/p VF Cardiac Arrest 02/22/2014  . Acute encephalopathy 02/22/2014  . Acute confusional state 02/22/2014  . Moderate malnutrition ([place]) 02/21/2014  . Convulsions/seizures ([place]) 02/14/2014  . Cardiac arrest ([place]) 02/13/2014  . Alcohol dependence ([place]) 01/06/2014  . Severe alcohol use disorder ([place]) 01/06/2014  . Adjustment disorder with depressed mood 12/14/2013  . 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 Folate deficiency 12/11/2013  . Alcoholism ([place]) 12/11/2013  . Alcohol withdrawal ([place]) 12/11/2013  . Hypokalemia 12/11/2013  . Malnutrition of moderate degree ([place]) 12/10/2013  . Abdominal pain 11/25/2013  . Mallory-Weiss tear 11/25/2013  . Acute blood loss anemia 11/24/2013  . Hematemesis 11/23/2013  . Fatty liver 10/29/2013  . Internal hemorrhoids with other complication 00/86/7619  . Hematochezia 10/28/2013  . Normocytic anemia 10/28/2013  . GIB (gastrointestinal bleeding) 10/28/2013  . Anxiety   . Post-operative state 09/29/2013  . Postoperative state 09/28/2013  . Female pelvic pain 09/21/2013  . Menorrhagia 09/21/2013  . History of ovarian cyst 09/21/2013  . Proctitis 09/21/2013  . Dysmenorrhea 09/21/2013    Past Surgical History:  Procedure Laterality Date  . APPENDECTOMY    .  colitis    . COLONOSCOPY N/A 09/30/2013   Procedure: COLONOSCOPY;  Surgeon: Lafayette Dragon, MD;  Location: [facility] ENDOSCOPY;  Service: Endoscopy;  Laterality: N/A;  . ESOPHAGOGASTRODUODENOSCOPY N/A 11/23/2013   Procedure: ESOPHAGOGASTRODUODENOSCOPY (EGD);  Surgeon: Jerene Bears, MD;  Location: [facility];  Service: Endoscopy;  Laterality: N/A;    . LAPAROSCOPIC APPENDECTOMY Right 09/28/2013   Procedure: APPENDECTOMY LAPAROSCOPIC;  Surgeon: Terrance Mass, MD;  Location: [facility];  Service: Gynecology;  Laterality: Right;  . LAPAROSCOPY N/A 09/28/2013   Procedure: LAPAROSCOPY OPERATIVE;  Surgeon: Terrance Mass, MD;  Location: [facility];  Service: Gynecology;  Laterality: N/A;  . LEFT AND RIGHT HEART CATHETERIZATION WITH CORONARY ANGIOGRAM N/A 02/23/2014   Procedure: LEFT AND RIGHT HEART CATHETERIZATION WITH CORONARY ANGIOGRAM;  Surgeon: Leonie Man, MD;  Location: [facility] CATH LAB;  Service: Cardiovascular;  Laterality: N/A;  . OVARIAN CYST REMOVAL    . SALPINGOOPHORECTOMY Right 09/28/2013   Procedure: SALPINGO OOPHORECTOMY;  Surgeon: Terrance Mass, MD;  Location: [facility];  Service: Gynecology;  Laterality: Right;    OB History    Gravida Para Term Preterm AB Living   7 3     4 3    SAB TAB Ectopic Multiple Live Births   4              Home Medications    Prior to Admission medications   Medication Sig Start Date End Date Taking? Authorizing Provider  carvedilol (COREG) 3.125 MG tablet Take 0.5 tablets (1.5625 mg total) by mouth 2 (two) times daily with a meal. Patient not taking: Reported on 09/07/2015 07/20/15   Withrow, Elyse Jarvis, FNP  cephALEXin (KEFLEX) 500 MG capsule Take 1 capsule (500 mg total) by mouth 4 (four) times daily. Patient not taking: Reported on 11/22/2016 11/06/16   Charlann Lange, PA-C  clindamycin (CLEOCIN) 150 MG capsule Take 1 capsule (150 mg total) by mouth every 6 (six) hours. Patient not taking: Reported on 11/22/2016 09/04/15   Domenic Moras, PA-C  diazepam (VALIUM) 2 MG tablet Take 1 tablet (2 mg total) by mouth every 6 (six) hours as needed for anxiety. Patient not taking: Reported on 11/05/2016 01/06/16   Deno Etienne, DO  dicyclomine (BENTYL) 20 MG tablet Take 1 tablet (20 mg total) by mouth 2 (two) times daily. Patient not taking: Reported on 11/29/2016 11/22/16   Long, Wonda Olds, MD  diphenhydrAMINE (BENADRYL) 25 mg  capsule Take 50 mg by mouth every 6 (six) hours as needed for sleep.    [provider]  etodolac (LODINE) 300 MG capsule Take 1 capsule (300 mg total) by mouth every 8 (eight) hours. 11/30/16   Dorie Rank, MD  FLUoxetine (PROZAC) 40 MG capsule Take 40 mg by mouth daily.    [provider]  HYDROcodone-acetaminophen (NORCO/VICODIN) 5-325 MG tablet Take 1 tablet by mouth every 4 (four) hours as needed. 11/29/16   Charlann Lange, PA-C  hydrOXYzine (ATARAX/VISTARIL) 25 MG tablet Take 1 tablet (25 mg total) by mouth every 6 (six) hours. Patient not taking: Reported on 11/29/2016 11/22/16   Long, Wonda Olds, MD  ibuprofen (ADVIL,MOTRIN) 600 MG tablet Take 1 tablet (600 mg total) by mouth every 6 (six) hours as needed. 11/29/16   Charlann Lange, PA-C  levETIRAcetam (KEPPRA) 1000 MG tablet Take 1 tablet (1,000 mg total) by mouth 2 (two) times daily. For seizure activities Patient not taking: Reported on 01/06/2016 07/20/15   Benjamine Mola, FNP  methocarbamol (ROBAXIN) 500 MG tablet Take 1 tablet (  500 mg total) by mouth 2 (two) times daily. Patient not taking: Reported on 10/26/2015 10/11/15   Palumbo, April, MD  ondansetron (ZOFRAN ODT) 4 MG disintegrating tablet Take 1 tablet (4 mg total) by mouth every 8 (eight) hours as needed for nausea or vomiting. Patient not taking: Reported on 11/29/2016 11/22/16   Long, Wonda Olds, MD  phenazopyridine (PYRIDIUM) 200 MG tablet Take 1 tablet (200 mg total) by mouth 3 (three) times daily. Patient not taking: Reported on 11/22/2016 11/06/16   Charlann Lange, PA-C  prazosin (MINIPRESS) 2 MG capsule Take 2 mg by mouth at bedtime.    [provider]  promethazine (PHENERGAN) 25 MG suppository Place 1 suppository (25 mg total) rectally every 6 (six) hours as needed for nausea or vomiting. Patient not taking: Reported on 12/07/2015 10/26/15   Charlesetta Shanks, MD    Family History Family History  Problem Relation Age of Onset  . Diabetes Mother   .  Hyperlipidemia Mother   . Stroke Mother   . Diabetes Father     Social History Social History  Substance Use Topics  . Smoking status: Never Smoker  . Smokeless tobacco: Never Used  . Alcohol use No     Comment: States not any more     Allergies   Morphine and related; Tramadol; and Penicillins   Review of Systems Review of Systems  Constitutional: Positive for appetite change, chills, fatigue and fever.  HENT: Positive for congestion, rhinorrhea, sinus pain, sinus pressure, sore throat and voice change.   Respiratory: Positive for cough, shortness of breath and wheezing.   Cardiovascular: Positive for chest pain (with coughing). Negative for palpitations.  Gastrointestinal: Positive for diarrhea. Negative for nausea and vomiting.  Musculoskeletal: Positive for myalgias.  Skin: Negative for rash.  Neurological: Positive for headaches.   Physical Exam Updated Vital Signs BP 115/80 (BP Location: Right Arm)   Pulse 91   Temp 98 F (36.7 C) (Oral)   Resp 16   Ht 5\' 6"  (1.676 m)   Wt 64.4 kg (142 lb)   LMP 12/29/2016   SpO2 100%   BMI 22.92 kg/m   Physical Exam  Constitutional: She is oriented to person, place, and time. She appears well-developed and well-nourished. No distress.  HENT:  Head: Normocephalic and atraumatic.  Right Ear: External ear normal.  Left Ear: External ear normal.  Mouth/Throat: Mucous membranes are normal. Posterior oropharyngeal erythema (mild) present. No oropharyngeal exudate or posterior oropharyngeal edema.  Eyes: Conjunctivae and EOM are normal. Pupils are equal, round, and reactive to light. Right eye exhibits no discharge. Left eye exhibits no discharge.  Neck: Normal range of motion. Neck supple. No thyromegaly present.  Has tenderness to the anterior aspect of the throat, no visible or palpable masses  Cardiovascular: Normal rate, regular rhythm, normal heart sounds and intact distal pulses.   No murmur heard. Pulmonary/Chest:  Effort normal and breath sounds normal. No stridor. No respiratory distress. She has no wheezes.  Intermittently coughing during exam  Abdominal: Soft. Bowel sounds are normal. She exhibits no distension. There is no tenderness. There is no guarding.  Musculoskeletal: Normal range of motion. She exhibits no edema, tenderness or deformity.  Lymphadenopathy:    She has no cervical adenopathy.  Neurological: She is alert and oriented to person, place, and time.  Skin: Skin is warm. Capillary refill takes less than 2 seconds. No rash noted. She is not diaphoretic.  Healing excoriations along the anterior shins  Psychiatric: Her mood appears anxious.  Affect somewhat strange.    ED Treatments / Results  Labs (all labs ordered are listed, but only abnormal results are displayed) Labs Reviewed - No data to display  EKG  EKG Interpretation None       Radiology Dg Chest 2 View  Result Date: 01/07/2017 CLINICAL DATA:  Cough. EXAM: CHEST  2 VIEW COMPARISON:  01/06/2016 . FINDINGS: Mediastinum and hilar structures normal. Lungs are clear. No pleural effusion or pneumothorax. Heart size normal. No acute bony abnormality IMPRESSION: No acute abnormality . Electronically Signed   By: Marcello Moores  Register   On: 01/07/2017 12:07    Procedures Procedures (including critical care time)  Medications Ordered in ED Medications - No data to display   Initial Impression / Assessment and Plan / ED Course  I have reviewed the triage vital signs and the nursing notes.  Pertinent labs & imaging results that were available during my care of the patient were reviewed by me and considered in my medical decision making (see chart for details).    1020: Chloraseptic spray, Tylenol 650mg , Mucinex ordered.  1133: Recheck, patient feeling somewhat better after Tylenol and throat spray.  Awaiting CXR to r/o CAP.  Final Clinical Impressions(s) / ED Diagnoses   Final diagnoses:  Viral URI with cough   Kristen Roberson is a 41 y.o. female that presents with cough, congestion, sore throat and myalgia.  Her exam was benign.  She was afebrile with normal VS.  CXR was negative for acute processes.  She showed no signs of bacterial infection.  She likely has a viral URI.  She was treated with oral Tylenol, Mucinex and Chloraseptic spray.  Home care instructions were reviewed.  Patient was encouraged to continue PO hydration and Tylenol prn as directed for myaglia/ headache.  Strict return precautions reviewed and reinforced on AVS.  Work note provided.  She was discharged in stable condition home.     New Prescriptions Discharge Medication List as of 01/07/2017 12:28 PM    START taking these medications   Details  guaiFENesin (MUCINEX) 600 MG 12 hr tablet Take 1 tablet (600 mg total) by mouth 2 (two) times daily., Starting Thu 01/07/2017, Until Fri 01/07/2018, [place], [place], DO 01/07/17 1415    Lajean Saver, MD 01/07/17 1429

## 2017-01-07 NOTE — ED Triage Notes (Signed)
Pt states she has had cough/sore throat/nasal congestion since Monday. Pt complaining that she is coughing up green mucous.

## 2017-01-15 ENCOUNTER — Encounter (HOSPITAL_COMMUNITY): Payer: Self-pay | Admitting: Emergency Medicine

## 2017-01-15 ENCOUNTER — Emergency Department (HOSPITAL_COMMUNITY)
Admission: EM | Admit: 2017-01-15 | Discharge: 2017-01-16 | Disposition: A | Discharge status: Other Acute Inpatient Hospital | Payer: Self-pay | Attending: Emergency Medicine | Admitting: Emergency Medicine

## 2017-01-15 ENCOUNTER — Emergency Department (HOSPITAL_COMMUNITY): Payer: Self-pay

## 2017-01-15 DIAGNOSIS — J40 Bronchitis, not specified as acute or chronic: Secondary | ICD-10-CM | POA: Insufficient documentation

## 2017-01-15 DIAGNOSIS — X788XXA Intentional self-harm by other sharp object, initial encounter: Secondary | ICD-10-CM | POA: Insufficient documentation

## 2017-01-15 DIAGNOSIS — Y999 Unspecified external cause status: Secondary | ICD-10-CM | POA: Insufficient documentation

## 2017-01-15 DIAGNOSIS — R45851 Suicidal ideations: Secondary | ICD-10-CM | POA: Insufficient documentation

## 2017-01-15 DIAGNOSIS — F1010 Alcohol abuse, uncomplicated: Secondary | ICD-10-CM | POA: Insufficient documentation

## 2017-01-15 DIAGNOSIS — Z046 Encounter for general psychiatric examination, requested by authority: Secondary | ICD-10-CM | POA: Insufficient documentation

## 2017-01-15 DIAGNOSIS — Z7289 Other problems related to lifestyle: Secondary | ICD-10-CM | POA: Insufficient documentation

## 2017-01-15 DIAGNOSIS — Y92199 Unspecified place in other specified residential institution as the place of occurrence of the external cause: Secondary | ICD-10-CM | POA: Insufficient documentation

## 2017-01-15 DIAGNOSIS — Y9389 Activity, other specified: Secondary | ICD-10-CM | POA: Insufficient documentation

## 2017-01-15 DIAGNOSIS — T1491XA Suicide attempt, initial encounter: Secondary | ICD-10-CM | POA: Insufficient documentation

## 2017-01-15 DIAGNOSIS — Z79899 Other long term (current) drug therapy: Secondary | ICD-10-CM | POA: Insufficient documentation

## 2017-01-15 LAB — CBC
HCT: 26.8 % — ABNORMAL LOW (ref 36.0–46.0)
HEMOGLOBIN: 8.1 g/dL — AB (ref 12.0–15.0)
MCH: 22.6 pg — AB (ref 26.0–34.0)
MCHC: 30.2 g/dL (ref 30.0–36.0)
MCV: 74.9 fL — ABNORMAL LOW (ref 78.0–100.0)
Platelets: 148 10*3/uL — ABNORMAL LOW (ref 150–400)
RBC: 3.58 MIL/uL — AB (ref 3.87–5.11)
RDW: 21.1 % — ABNORMAL HIGH (ref 11.5–15.5)
WBC: 4 10*3/uL (ref 4.0–10.5)

## 2017-01-15 LAB — COMPREHENSIVE METABOLIC PANEL
ALT: 12 U/L — ABNORMAL LOW (ref 14–54)
AST: 38 U/L (ref 15–41)
Albumin: 4.2 g/dL (ref 3.5–5.0)
Alkaline Phosphatase: 86 U/L (ref 38–126)
Anion gap: 10 (ref 5–15)
BUN: 8 mg/dL (ref 6–20)
CHLORIDE: 110 mmol/L (ref 101–111)
CO2: 20 mmol/L — ABNORMAL LOW (ref 22–32)
Calcium: 8.8 mg/dL — ABNORMAL LOW (ref 8.9–10.3)
Creatinine, Ser: 0.55 mg/dL (ref 0.44–1.00)
Glucose, Bld: 98 mg/dL (ref 65–99)
POTASSIUM: 3.6 mmol/L (ref 3.5–5.1)
Sodium: 140 mmol/L (ref 135–145)
Total Bilirubin: 0.6 mg/dL (ref 0.3–1.2)
Total Protein: 7.5 g/dL (ref 6.5–8.1)

## 2017-01-15 LAB — RAPID URINE DRUG SCREEN, HOSP PERFORMED
AMPHETAMINES: NOT DETECTED
BENZODIAZEPINES: NOT DETECTED
Barbiturates: NOT DETECTED
Cocaine: NOT DETECTED
OPIATES: NOT DETECTED
TETRAHYDROCANNABINOL: NOT DETECTED

## 2017-01-15 LAB — ETHANOL: ALCOHOL ETHYL (B): 331 mg/dL — AB (ref <5)

## 2017-01-15 LAB — HCG, QUANTITATIVE, PREGNANCY

## 2017-01-15 LAB — TROPONIN I: Troponin I: <0.03 ng/mL (ref <0.03)

## 2017-01-15 LAB — ACETAMINOPHEN LEVEL: Acetaminophen (Tylenol), Serum: <10 ug/mL — ABNORMAL LOW (ref 10–30)

## 2017-01-15 LAB — SALICYLATE LEVEL
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
## 2017-01-15 NOTE — ED Notes (Signed)
Received call from lab with critical ETOH 331.  Dr. Billy Fischer informed.

## 2017-01-15 NOTE — ED Notes (Signed)
Bed: ZX28 Expected date:  Expected time:  Means of arrival:  Comments: EMS- SI attempt
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
## 2017-01-15 NOTE — ED Notes (Signed)
Pt stated "I have a Bachelor's in [place]."
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
## 2017-01-15 NOTE — ED Notes (Signed)
Patient transported to X-ray 

## 2017-01-16 ENCOUNTER — Inpatient Hospital Stay (HOSPITAL_COMMUNITY)
Admission: AD | Admit: 2017-01-16 | Discharge: 2017-01-19 | DRG: 885 | Disposition: A | Discharge status: Home / Self Care | Payer: Federal, State, Local not specified - Other | Attending: Psychiatry | Admitting: Psychiatry

## 2017-01-16 ENCOUNTER — Encounter (HOSPITAL_COMMUNITY): Payer: Self-pay | Admitting: *Deleted

## 2017-01-16 DIAGNOSIS — M62838 Other muscle spasm: Secondary | ICD-10-CM | POA: Diagnosis present

## 2017-01-16 DIAGNOSIS — Z79899 Other long term (current) drug therapy: Secondary | ICD-10-CM | POA: Diagnosis not present

## 2017-01-16 DIAGNOSIS — Y9259 Other trade areas as the place of occurrence of the external cause: Secondary | ICD-10-CM | POA: Diagnosis not present

## 2017-01-16 DIAGNOSIS — F332 Major depressive disorder, recurrent severe without psychotic features: Secondary | ICD-10-CM | POA: Diagnosis present

## 2017-01-16 DIAGNOSIS — X789XXA Intentional self-harm by unspecified sharp object, initial encounter: Secondary | ICD-10-CM | POA: Diagnosis present

## 2017-01-16 DIAGNOSIS — Z888 Allergy status to other drugs, medicaments and biological substances status: Secondary | ICD-10-CM | POA: Diagnosis not present

## 2017-01-16 DIAGNOSIS — Z885 Allergy status to narcotic agent status: Secondary | ICD-10-CM

## 2017-01-16 DIAGNOSIS — Z880 Allergy status to penicillin: Secondary | ICD-10-CM | POA: Diagnosis not present

## 2017-01-16 DIAGNOSIS — Z8674 Personal history of sudden cardiac arrest: Secondary | ICD-10-CM | POA: Diagnosis not present

## 2017-01-16 DIAGNOSIS — F329 Major depressive disorder, single episode, unspecified: Secondary | ICD-10-CM | POA: Diagnosis present

## 2017-01-16 DIAGNOSIS — F419 Anxiety disorder, unspecified: Secondary | ICD-10-CM | POA: Diagnosis present

## 2017-01-16 DIAGNOSIS — T1491XA Suicide attempt, initial encounter: Secondary | ICD-10-CM | POA: Diagnosis present

## 2017-01-16 LAB — ETHANOL: Alcohol, Ethyl (B): 158 mg/dL — ABNORMAL HIGH (ref <5)

## 2017-01-16 MED ORDER — LORAZEPAM 2 MG/ML IJ SOLN: 0.0000 mg | Freq: Two times a day (BID) | INTRAMUSCULAR | Status: DC

## 2017-01-16 MED ORDER — ACETAMINOPHEN 325 MG PO TABS
650.0000 mg | ORAL_TABLET | ORAL | Status: DC | PRN
  Administered 2017-01-16: 650 mg via ORAL
  Filled 2017-01-16: qty 2

## 2017-01-16 MED ORDER — METHOCARBAMOL 500 MG PO TABS
500.0000 mg | ORAL_TABLET | Freq: Two times a day (BID) | ORAL | Status: DC
  Administered 2017-01-16 – 2017-01-19 (×6): 500 mg via ORAL
  Filled 2017-01-16 (×13): qty 1

## 2017-01-16 MED ORDER — HYDROXYZINE HCL 25 MG PO TABS: 25.0000 mg | ORAL_TABLET | Freq: Four times a day (QID) | ORAL | Status: DC

## 2017-01-16 MED ORDER — ALUM & MAG HYDROXIDE-SIMETH 200-200-20 MG/5ML PO SUSP: 30.0000 mL | Freq: Four times a day (QID) | ORAL | Status: DC | PRN

## 2017-01-16 MED ORDER — LORAZEPAM 1 MG PO TABS: 0.0000 mg | ORAL_TABLET | Freq: Two times a day (BID) | ORAL | Status: DC

## 2017-01-16 MED ORDER — LORAZEPAM 1 MG PO TABS
0.0000 mg | ORAL_TABLET | Freq: Four times a day (QID) | ORAL | Status: DC
  Administered 2017-01-16: 1 mg via ORAL
  Filled 2017-01-16: qty 1

## 2017-01-16 MED ORDER — METHOCARBAMOL 500 MG PO TABS
500.0000 mg | ORAL_TABLET | Freq: Two times a day (BID) | ORAL | Status: DC
  Administered 2017-01-16 (×2): 500 mg via ORAL
  Filled 2017-01-16 (×2): qty 1

## 2017-01-16 MED ORDER — LEVETIRACETAM 500 MG PO TABS
1000.0000 mg | ORAL_TABLET | Freq: Two times a day (BID) | ORAL | Status: DC
  Administered 2017-01-16 (×2): 1000 mg via ORAL
  Filled 2017-01-16 (×2): qty 2

## 2017-01-16 MED ORDER — LORAZEPAM 2 MG/ML IJ SOLN: 0.0000 mg | Freq: Four times a day (QID) | INTRAMUSCULAR | Status: DC

## 2017-01-16 MED ORDER — ONDANSETRON HCL 4 MG PO TABS: 4.0000 mg | ORAL_TABLET | Freq: Three times a day (TID) | ORAL | Status: DC | PRN

## 2017-01-16 MED ORDER — VITAMIN B-1 100 MG PO TABS
100.0000 mg | ORAL_TABLET | Freq: Every day | ORAL | Status: DC
  Administered 2017-01-16: 100 mg via ORAL
  Filled 2017-01-16: qty 1

## 2017-01-16 MED ORDER — ADULT MULTIVITAMIN W/MINERALS CH
1.0000 | ORAL_TABLET | Freq: Every day | ORAL | Status: DC
  Administered 2017-01-16 – 2017-01-19 (×4): 1 via ORAL
  Filled 2017-01-16 (×8): qty 1

## 2017-01-16 MED ORDER — MAGNESIUM HYDROXIDE 400 MG/5ML PO SUSP: 30.0000 mL | Freq: Every day | ORAL | Status: DC | PRN

## 2017-01-16 MED ORDER — BENZONATATE 100 MG PO CAPS: 100.0000 mg | ORAL_CAPSULE | Freq: Three times a day (TID) | ORAL | Status: DC | PRN

## 2017-01-16 MED ORDER — HYDROXYZINE HCL 25 MG PO TABS
25.0000 mg | ORAL_TABLET | Freq: Four times a day (QID) | ORAL | Status: DC
  Administered 2017-01-16: 25 mg via ORAL
  Filled 2017-01-16 (×2): qty 1

## 2017-01-16 MED ORDER — BENZONATATE 100 MG PO CAPS
100.0000 mg | ORAL_CAPSULE | Freq: Three times a day (TID) | ORAL | Status: DC | PRN
  Administered 2017-01-19: 100 mg via ORAL
  Filled 2017-01-16: qty 1

## 2017-01-16 MED ORDER — VITAMIN B-1 100 MG PO TABS
100.0000 mg | ORAL_TABLET | Freq: Every day | ORAL | Status: DC
  Administered 2017-01-17 – 2017-01-19 (×3): 100 mg via ORAL
  Filled 2017-01-16 (×8): qty 1

## 2017-01-16 MED ORDER — IBUPROFEN 200 MG PO TABS
600.0000 mg | ORAL_TABLET | Freq: Three times a day (TID) | ORAL | Status: DC | PRN
  Administered 2017-01-16: 600 mg via ORAL
  Filled 2017-01-16: qty 3

## 2017-01-16 MED ORDER — ACETAMINOPHEN 325 MG PO TABS: 650.0000 mg | ORAL_TABLET | ORAL | Status: DC | PRN

## 2017-01-16 MED ORDER — THIAMINE HCL 100 MG/ML IJ SOLN: 100.0000 mg | Freq: Every day | INTRAMUSCULAR | Status: DC

## 2017-01-16 MED ORDER — LEVETIRACETAM 500 MG PO TABS
1000.0000 mg | ORAL_TABLET | Freq: Two times a day (BID) | ORAL | Status: DC
  Administered 2017-01-16 – 2017-01-19 (×6): 1000 mg via ORAL
  Filled 2017-01-16 (×14): qty 2

## 2017-01-16 MED ORDER — LORAZEPAM 1 MG PO TABS
1.0000 mg | ORAL_TABLET | Freq: Four times a day (QID) | ORAL | Status: AC | PRN
  Administered 2017-01-16: 1 mg via ORAL
  Filled 2017-01-16: qty 1

## 2017-01-16 MED ORDER — FLUOXETINE HCL 20 MG PO CAPS
40.0000 mg | ORAL_CAPSULE | Freq: Every day | ORAL | Status: DC
  Administered 2017-01-17 – 2017-01-19 (×3): 40 mg via ORAL
  Filled 2017-01-16 (×6): qty 2

## 2017-01-16 MED ORDER — LOPERAMIDE HCL 2 MG PO CAPS: 2.0000 mg | ORAL_CAPSULE | ORAL | Status: AC | PRN

## 2017-01-16 MED ORDER — HYDROXYZINE HCL 25 MG PO TABS
25.0000 mg | ORAL_TABLET | Freq: Four times a day (QID) | ORAL | Status: AC | PRN
  Administered 2017-01-16 – 2017-01-19 (×8): 25 mg via ORAL
  Filled 2017-01-16 (×10): qty 1

## 2017-01-16 MED ORDER — IBUPROFEN 600 MG PO TABS
600.0000 mg | ORAL_TABLET | Freq: Three times a day (TID) | ORAL | Status: DC | PRN
  Administered 2017-01-16: 600 mg via ORAL
  Filled 2017-01-16: qty 1

## 2017-01-16 MED ORDER — FLUOXETINE HCL 20 MG PO CAPS
40.0000 mg | ORAL_CAPSULE | Freq: Every day | ORAL | Status: DC
  Administered 2017-01-16: 40 mg via ORAL
  Filled 2017-01-16: qty 2

## 2017-01-16 NOTE — ED Notes (Signed)
Hourly rounding reveals patient sleeping in room. No complaints, stable, in no acute distress. Q15 minute rounds and monitoring via Security Cameras to continue. 

## 2017-01-16 NOTE — Progress Notes (Signed)
[facility] Group Notes:  (Nursing/MHT/Case Management/Adjunct)  Date:  01/16/2017  Time:  2045  Type of Therapy:  wrap up group  Participation Level:  Active  Participation Quality:  Appropriate, Attentive, Sharing and Supportive  Affect:  Appropriate  Cognitive:  Appropriate  Insight:  Lacking  Engagement in Group:  Engaged  Modes of Intervention:  Clarification, Education and Support  Summary of Progress/Problems:Pt shares that if she could change one thing in her life it would be her alcohol use. Pt reports being sober a good thing and she plans on looking for a new apartment with her fiance whom she is grateful for.   [name] 01/16/2017, 9:27 PM

## 2017-01-16 NOTE — Tx Team (Signed)
Initial Treatment Plan 01/16/2017 2:26 PM KEYSHA DAMEWOOD QIO:[id number]    PATIENT STRESSORS: Marital or family conflict Substance abuse   PATIENT STRENGTHS: Ability for insight Average or above average intelligence Capable of independent living General fund of knowledge   PATIENT IDENTIFIED PROBLEMS: Depression Suicidal thoughts Alcohol abuse "Talking about everything, not holding it in"                     DISCHARGE CRITERIA:  Ability to meet basic life and health needs Improved stabilization in mood, thinking, and/or behavior Verbal commitment to aftercare and medication compliance Withdrawal symptoms are absent or subacute and managed without 24-hour nursing intervention  PRELIMINARY DISCHARGE PLAN: Attend aftercare/continuing care group Return to previous living arrangement  PATIENT/FAMILY INVOLVEMENT: This treatment plan has been presented to and reviewed with the patient, NICOYA FRIEL, and/or family member, .  The patient and family have been given the opportunity to ask questions and make suggestions.  [place], [place], [place] 01/16/2017, 2:26 PM

## 2017-01-16 NOTE — ED Notes (Signed)
Up to the bathroom 

## 2017-01-16 NOTE — ED Notes (Signed)
Pt resting quietly, reports no significant improvement to anxiety.

## 2017-01-16 NOTE — ED Notes (Signed)
Pt. Transferred to [facility] from ED to room 36 after screening for contraband. Report to include Situation, Background, Assessment and Recommendations from RN. Pt. Oriented to unit including Q15 minute rounds as well as the security cameras for their protection. Patient is alert and oriented, warm and dry in no acute distress. Patient denies SI, HI, and AVH. Pt. Encouraged to let me know if needs arise. 

## 2017-01-16 NOTE — Progress Notes (Signed)
D    Pt is pleasant on approach and cooperative   She does endorse depression and was admitting she needs to learn a better way to deal with events of life   She talked about taking it one day at a time A   Pt praised for insight   Verbal support given   Medications administered and effectiveness monitored    Q 15 min checks R   Pt is safe at present

## 2017-01-16 NOTE — Progress Notes (Signed)
CSW filed patient's examination and recommendation paperwork into IVC logbook.  Demetreus Lothamer, LCSWA [facility]  Clinical Social Worker [phone] 

## 2017-01-16 NOTE — ED Notes (Signed)
Boyfriend into see

## 2017-01-16 NOTE — Progress Notes (Signed)
41 year old female pt admitted on involuntary basis. On admission, pt reports feeling overwhelmed and spoke about how she felt having a couple of drinks would help with how she was feeling. She reports that she did make some cuts to wrist and did call 911. On admission, she denies any SI and reports that what she did was a mistake. She spoke about how her mother is sick and how she has to find a new place to live as being a couple of her stressors. She reports that she goes to [facility] for med management and reports that she takes all her medications as prescribed. Rules and expectations were provided to [place] and she verbalized understanding. [name] was oriented to the unit and safety maintained.

## 2017-01-16 NOTE — ED Notes (Signed)
Feeling better, eating lunch

## 2017-01-16 NOTE — ED Notes (Signed)
Pt ambulatory w/o difficulty to [facility] with [employer], belongings given to officers. 

## 2017-01-16 NOTE — [facility] Counselor (Signed)
Per Arville Go, [facility] pt has been accepted to [facility], assigned to room/bed: 301-1, pt can come after 0800. Attending physician: Dr. Parke Poisson. Nursing report: [phone]. Updated disposition discussed with Dominica Severin, RN.   Vertell Novak, MS, [facility], [facility] Triage Specialist [phone]

## 2017-01-17 DIAGNOSIS — F1910 Other psychoactive substance abuse, uncomplicated: Secondary | ICD-10-CM

## 2017-01-17 DIAGNOSIS — Z789 Other specified health status: Secondary | ICD-10-CM

## 2017-01-17 DIAGNOSIS — F332 Major depressive disorder, recurrent severe without psychotic features: Principal | ICD-10-CM

## 2017-01-17 DIAGNOSIS — F1099 Alcohol use, unspecified with unspecified alcohol-induced disorder: Secondary | ICD-10-CM

## 2017-01-17 NOTE — Progress Notes (Signed)
Patient ID: Kristen Roberson, female   DOB: May 18, 1976, 41 y.o.   MRN: [id number]  D: Patient observed in dayroom watching TV and interacting well with peers on approach. Pt denies any withdrawal symptoms. Pt hoping to discharge tomorrow to move into a different apartment. Denies  SI/HI/AVH and pain.No behavioral issues noted.  A: Support and encouragement offered as needed. Medications administered as prescribed.  R: Patient cooperative and appropriate on unit. Will continue to monitor patient for safety and stability.

## 2017-01-17 NOTE — Progress Notes (Signed)
Patient did attend the evening speaker AA meeting.  
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
## 2017-01-17 NOTE — [facility] Group Notes (Signed)
[facility] LCSW Group Therapy  01/17/2017   Type of Therapy:  Group Therapy  Participation Level:  Active  Participation Quality:  Appropriate and Attentive  Affect:  Appropriate  Cognitive:  Alert and Oriented  Insight:  Improving  Engagement in Therapy:  Improving  Modes of Intervention:  Discussion  Today's group was about using different tools to prepare for successful discharge. Facilitator identified three major areas to review. One was supports at discharge. Another area was utilizing treatment planning and services. Finally identifying self-care goals in order to have clear directive for ongoing progress. Patients were able to engage well and identify how to develop these key tools for a good discharge. Patient engaged well in group and identified that the need for self care and balancing her ability to say now are important for her progress.   Christene Lye MSW, LCSW

## 2017-01-17 NOTE — Progress Notes (Signed)
Patient ID: Kristen Roberson, female   DOB: [birth year], 41 y.o.   MRN: [id number] D: Patient's affect is brighter today; she appears less depressed.  Patient denies any thoughts of self harm.  She is observed interacting with her peers in the day room.  Patient states that she will lose her apartment tomorrow and hopes to be discharged.  Patient states she attends AA and it "is a good support for me."  Patient states she is not having any withdrawal symptoms from alcohol use.  She rates her depression as a 5; denies hopelessness and rates her anxiety as an 8.   A: Continue to monitor medication management and MD orders.  Safety checks completed every 15 minutes per protocol.  Offer support and encouragement as needed. R: Patient is receptive to staff; her behavior is appropriate.
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
## 2017-01-17 NOTE — BHH Group Notes (Signed)
[facility] Group Notes:  (Nursing/MHT/Case Management/Adjunct)  Date:  01/17/2017  Time:  0900 am  Type of Therapy:  Psychoeducational Skills  Participation Level:  Active  Participation Quality:  Appropriate and Attentive  Affect:  Appropriate  Cognitive:  Alert and Appropriate  Insight:  Appropriate  Engagement in Group:  Engaged  Modes of Intervention:  Support  Summary of Progress/Problems: Patient listened attentively and asked appropriate questions.  Patient is concerned about having to move out of her apartment tomorrow.  She is hoping that her landlord will hold onto her belongings.  Zipporah Plants 01/17/2017, 2:48 PM

## 2017-01-18 ENCOUNTER — Encounter (HOSPITAL_COMMUNITY): Payer: Self-pay | Admitting: Behavioral Health

## 2017-01-18 DIAGNOSIS — F419 Anxiety disorder, unspecified: Secondary | ICD-10-CM

## 2017-01-18 DIAGNOSIS — F322 Major depressive disorder, single episode, severe without psychotic features: Secondary | ICD-10-CM

## 2017-01-18 DIAGNOSIS — M62838 Other muscle spasm: Secondary | ICD-10-CM

## 2017-01-18 MED ORDER — TRAZODONE HCL 50 MG PO TABS
50.0000 mg | ORAL_TABLET | Freq: Every evening | ORAL | Status: DC | PRN
  Administered 2017-01-18: 50 mg via ORAL
  Filled 2017-01-18 (×6): qty 1

## 2017-01-18 NOTE — BHH Group Notes (Signed)
[facility] LCSW Group Therapy  01/18/2017 3:36 PM  Type of Therapy:  Group Therapy  Participation Level:  Minimal  Participation Quality:  Attentive  Affect:  Irritable  Cognitive:  Oriented, Confused, Delusional and Lacking  Insight:  Improving  Engagement in Therapy:  Improving  Modes of Intervention:  Discussion, Education, Exploration, Socialization and Support  Summary of Progress/Problems: Today's Topic: Overcoming Obstacles. Patients identified one short term goal and potential obstacles in reaching this goal. Patients processed barriers involved in overcoming these obstacles. Patients identified steps necessary for overcoming these obstacles and explored motivation (internal and external) for facing these difficulties head on.   Alianah Lofton N Smart LCSW 01/18/2017, 3:36 PM

## 2017-01-18 NOTE — Progress Notes (Signed)
Pt reports she had a fair day.  She received word that she was being evicted from her apartment, so she needs to be discharged on Tuesday to get her things out of the apartment.  She has been observed in the dayroom talking with peers.  She is polite and appropriate.  She denies SI/HI/AVh.  She denies having any withdrawal symptoms at this time.  She says she has a plan for moving to another place.  Her only concern is that she would be able to sleep tonight, and stated that she has not been receiving her Trazodone while she has been here.  Writer spoke to the provider who ordered her Trazodone for tonight.  Pt was grateful to be getting her Trazodone for the night.  Support and encouragement offered.  Discharge plans are in process.  Safety maintained with q15 minute checks.

## 2017-01-18 NOTE — BHH Suicide Risk Assessment (Addendum)
[facility] INPATIENT:  Family/Significant Other Suicide Prevention Education  Suicide Prevention Education:  Contact Attempts: Laqueta Jean (pt's boyfriend) [phone] has been identified by the patient as the family member/significant other with whom the patient will be residing, and identified as the person(s) who will aid the patient in the event of a mental health crisis.  With written consent from the patient, two attempts were made to provide suicide prevention education, prior to and/or following the patient's discharge.  We were unsuccessful in providing suicide prevention education.  A suicide education pamphlet was given to the patient to share with family/significant other.  Date and time of first attempt: 9:45AM (voicemail left requesting call back at his earliest convenience).  Date and time of second attempt: 10:58AM  Kimber Relic Smart LCSW 01/18/2017, 10:59 AM   Pt's boyfriend states that he is fully supportive of her emotionally when she discharges and is anticipating her discharge for Mon/Tuesday. He has no other concerns regarding patient safety.   Maxie Better, MSW, LCSW Clinical Social Worker 01/19/2017 8:41 AM

## 2017-01-18 NOTE — Progress Notes (Signed)
  [facility] Adult Case Management Discharge Plan :  Will you be returning to the same living situation after discharge:  Yes,  home-pt is moving into another apt at discharge At discharge, do you have transportation home?: Yes,  bus Do you have the ability to pay for your medications: Yes,  mental health  Release of information consent forms completed and submitted to medical records by CSW.  Patient to Follow up at: Follow-up Information    [facility] Follow up.   Specialty:  Behavioral Health Why:  Social worker will confirm your next appointment with Grover Canavan. Contact information: [place] [address] [phone]           Next level of care provider has access to [place] and Suicide Prevention discussed: Yes,  SPE completed with pt; contact attempts made with pt's boyfriend. SPI pamphlet and Mobile Crisis information provided to pt.   Have you used any form of tobacco in the last 30 days? (Cigarettes, Smokeless Tobacco, Cigars, and/or Pipes): No  Has patient been referred to the Quitline?: N/A patient is not a smoker  Patient has been referred for addiction treatment: Yes  Romeka Scifres N Smart LCSW 01/18/2017, 11:01 AM

## 2017-01-18 NOTE — Progress Notes (Signed)
[facility] MD Progress Note  01/18/2017 1:51 PM MALVA DIESING  MRN:  [id number]  Subjective:  " I need to get out of here. I am getting put out of my apartment today and I have to move my things. I am doing much better."  Evaluation on the unit: Face to face evaluation completed and chart reviewed. Patient was admitted to [facility] for cutting her wrist. She presents very anxious in mood and affect. Patient reports improvement in depressive symptoms although she was admitted to the unit yesterday. Patient maybe minimizing the severity of symptoms. She denies SI with plan or intent. Denies homicidal ideas. Denies AVH and maybe minimizing symptoms. She does not appear fully vested in treatment and continues to focus on discharge. Her insight is lacking as minimizes  her reason for admission and cutting behaviors. She endorses good appetite and sleeping pattern. Reports medications are well tolerated and without side ffects. At this time, she is able to contract for safety on the unit.  Principal Problem: MDD (major depressive disorder) Diagnosis:   Patient Active Problem List   Diagnosis Date Noted  . MDD (major depressive disorder) [F32.9] 01/16/2017  . Alcohol dependence with withdrawal, uncomplicated ([place]) [W96.759] 03/17/2015  . Hepatic encephalopathy ([place]) [K72.90] 02/23/2015  . Seizure ([place]) [R56.9] 02/23/2015  . Pancytopenia ([place]) [F63.846] 02/23/2015  . Internal hemorrhoids [K64.8] 02/23/2015  . Alcohol intoxication ([place]) [F10.929]   . Hemorrhoid [K64.9]   . C. difficile colitis [A04.72]   . Alcohol abuse [F10.10]   . Elevated liver enzymes [R74.8]   . Colitis [K52.9] 12/12/2014  . Abrasion of wrist [S60.819A]   . Alcohol dependence with uncomplicated withdrawal ([place]) [F10.230]   . GAD (generalized anxiety disorder) [F41.1] 07/01/2014  . Alcohol withdrawal syndrome without complication ([place]) [K59.935] 06/30/2014  . MDD (major depressive disorder), recurrent episode, moderate ([place]) [F33.1]   .  Alcohol use disorder, severe, dependence ([place]) [F10.20]   . Alcohol dependence with withdrawal with complication ([place]) [T01.779] 05/19/2014  . Thrombocytopenia ([place]) [D69.6] 05/07/2014  . Hypokalemia [E87.6] 05/07/2014  . Right sided weakness [R53.1] 04/15/2014  . Seizures ([place]) [R56.9] 04/04/2014  . Substance induced mood disorder ([place]) [F19.94] 03/26/2014  . Status post myocardial infarction [I25.2] 03/06/2014  . Depression [F32.9] 03/06/2014  . Substance abuse [F19.10] 02/22/2014  . Acute respiratory failure with hypoxia ([place]) [J96.01] 02/22/2014  . Ventricular fibrillation ([place]) [I49.01] 02/22/2014  . Acute systolic heart failure - s/p VF Cardiac Arrest [I50.21] 02/22/2014  . Acute encephalopathy [G93.40] 02/22/2014  . Acute confusional state [F05] 02/22/2014  . Moderate malnutrition ([place]) [E44.0] 02/21/2014  . Convulsions/seizures ([place]) [R56.9] 02/14/2014  . Cardiac arrest ([place]) [I46.9] 02/13/2014  . Alcohol dependence ([place]) [F10.20] 01/06/2014  . Severe alcohol use disorder ([place]) [F10.20] 01/06/2014  . Adjustment disorder with depressed mood [F43.21] 12/14/2013  . 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 Folate deficiency [E53.8] 12/11/2013  . Alcoholism ([place]) [F10.20] 12/11/2013  . Alcohol withdrawal ([place]) [F10.239] 12/11/2013  . Hypokalemia [E87.6] 12/11/2013  . Malnutrition of moderate degree ([place]) [E44.0] 12/10/2013  . Abdominal pain [R10.9] 11/25/2013  . Mallory-Weiss tear [K22.6] 11/25/2013  . Acute blood loss anemia [D62] 11/24/2013  . Hematemesis [K92.0] 11/23/2013  . Fatty liver [K76.0] 10/29/2013  . Internal hemorrhoids with other complication [T90.3] 00/92/3300  . Hematochezia [K92.1] 10/28/2013  . Normocytic anemia [D64.9] 10/28/2013  . GIB (gastrointestinal bleeding) [K92.2] 10/28/2013  . Anxiety [F41.9]   . Post-operative state [Z98.890] 09/29/2013  . Postoperative state [Z98.890] 09/28/2013  . Female pelvic pain [  R10.2] 09/21/2013  .  Menorrhagia [N92.0] 09/21/2013  . History of ovarian cyst [Z87.42] 09/21/2013  . 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 Dysmenorrhea [N94.6] 09/21/2013   Total Time spent with patient: 30 minutes  Past Psychiatric History: depression  Past Medical History:  Past Medical History:  Diagnosis Date  . Alcohol abuse   . Anemia   . Anxiety   . Blood transfusion without reported diagnosis   . Cardiac arrest ([place])   . Cysts of both ovaries   . Depression   . Fatty liver 10/05/13  . Proctitis   . Seizures ([place])     Past Surgical History:  Procedure Laterality Date  . APPENDECTOMY    . colitis    . COLONOSCOPY N/A 09/30/2013   Procedure: COLONOSCOPY;  Surgeon: Lafayette Dragon, MD;  Location: [facility];  Service: Endoscopy;  Laterality: N/A;  . ESOPHAGOGASTRODUODENOSCOPY N/A 11/23/2013   Procedure: ESOPHAGOGASTRODUODENOSCOPY (EGD);  Surgeon: Jerene Bears, MD;  Location: [facility];  Service: Endoscopy;  Laterality: N/A;  . LAPAROSCOPIC APPENDECTOMY Right 09/28/2013   Procedure: APPENDECTOMY LAPAROSCOPIC;  Surgeon: Terrance Mass, MD;  Location: [facility];  Service: Gynecology;  Laterality: Right;  . LAPAROSCOPY N/A 09/28/2013   Procedure: LAPAROSCOPY OPERATIVE;  Surgeon: Terrance Mass, MD;  Location: [facility];  Service: Gynecology;  Laterality: N/A;  . LEFT AND RIGHT HEART CATHETERIZATION WITH CORONARY ANGIOGRAM N/A 02/23/2014   Procedure: LEFT AND RIGHT HEART CATHETERIZATION WITH CORONARY ANGIOGRAM;  Surgeon: Leonie Man, MD;  Location: [facility] CATH LAB;  Service: Cardiovascular;  Laterality: N/A;  . OVARIAN CYST REMOVAL    . SALPINGOOPHORECTOMY Right 09/28/2013   Procedure: SALPINGO OOPHORECTOMY;  Surgeon: Terrance Mass, MD;  Location: [facility];  Service: Gynecology;  Laterality: Right;   Family History:  Family History  Problem Relation Age of Onset  . Diabetes Mother   . Hyperlipidemia Mother   . Stroke Mother   . Diabetes Father    Family Psychiatric  History: See chart  Social  History:  History  Alcohol Use  . Yes    Comment: reports she has had some over the past week     History  Drug Use No    Social History   Social History  . Marital status: Legally Separated    Spouse name: N/A  . Number of children: N/A  . Years of education: N/A   Social History Main Topics  . Smoking status: Never Smoker  . Smokeless tobacco: Never Used  . Alcohol use Yes     Comment: reports she has had some over the past week  . Drug use: No  . Sexual activity: Not Asked   Other Topics Concern  . None   Social History Narrative  . None   Additional Social History:                         Sleep: Good  Appetite:  Good  Current Medications: Current Facility-Administered Medications  Medication Dose Route Frequency Provider Last Rate Last Dose  . acetaminophen (TYLENOL) tablet 650 mg  650 mg Oral Q4H PRN Kerrie Buffalo, NP      . alum & mag hydroxide-simeth (MAALOX/MYLANTA) 200-200-20 MG/5ML suspension 30 mL  30 mL Oral Q6H PRN Kerrie Buffalo, NP      . benzonatate (TESSALON) capsule 100 mg  100 mg Oral TID PRN Kerrie Buffalo, NP      . FLUoxetine (PROZAC) capsule 40 mg  40 mg Oral Daily  Kerrie Buffalo, NP   40 mg at 01/18/17 0804  . hydrOXYzine (ATARAX/VISTARIL) tablet 25 mg  25 mg Oral Q6H PRN Kerrie Buffalo, NP   25 mg at 01/18/17 0807  . ibuprofen (ADVIL,MOTRIN) tablet 600 mg  600 mg Oral Q8H PRN Kerrie Buffalo, NP   600 mg at 01/16/17 2226  . levETIRAcetam (KEPPRA) tablet 1,000 mg  1,000 mg Oral BID Kerrie Buffalo, NP   1,000 mg at 01/18/17 0804  . loperamide (IMODIUM) capsule 2-4 mg  2-4 mg Oral PRN Kerrie Buffalo, NP      . LORazepam (ATIVAN) tablet 1 mg  1 mg Oral Q6H PRN Kerrie Buffalo, NP   1 mg at 01/16/17 2226  . magnesium hydroxide (MILK OF MAGNESIA) suspension 30 mL  30 mL Oral Daily PRN Kerrie Buffalo, NP      . methocarbamol (ROBAXIN) tablet 500 mg  500 mg Oral BID Kerrie Buffalo, NP   500 mg at 01/18/17 0804  . multivitamin  with minerals tablet 1 tablet  1 tablet Oral Daily Kerrie Buffalo, NP   1 tablet at 01/18/17 0805  . ondansetron (ZOFRAN) tablet 4 mg  4 mg Oral Q8H PRN Kerrie Buffalo, NP      . thiamine (VITAMIN B-1) tablet 100 mg  100 mg Oral Daily Kerrie Buffalo, NP   100 mg at 01/18/17 1096   Or  . thiamine (B-1) injection 100 mg  100 mg Intravenous Daily Kerrie Buffalo, NP        Lab Results: No results found for this or any previous visit (from the past 80 hour(s)).  Blood Alcohol level:  Lab Results  Component Value Date   ETH 158 (H) 01/16/2017   ETH 331 (HH) 04/54/0981    Metabolic Disorder Labs: Lab Results  Component Value Date   HGBA1C 4.4 12/09/2013   MPG 80 12/09/2013   No results found for: PROLACTIN No results found for: CHOL, TRIG, HDL, CHOLHDL, VLDL, LDLCALC  Physical Findings: AIMS: Facial and Oral Movements Muscles of Facial Expression: None, normal Lips and Perioral Area: None, normal Jaw: None, normal Tongue: None, normal,Extremity Movements Upper (arms, wrists, hands, fingers): None, normal Lower (legs, knees, ankles, toes): None, normal, Trunk Movements Neck, shoulders, hips: None, normal, Overall Severity Severity of abnormal movements (highest score from questions above): None, normal Incapacitation due to abnormal movements: None, normal Patient's awareness of abnormal movements (rate only patient's report): No Awareness, Dental Status Current problems with teeth and/or dentures?: No Does patient usually wear dentures?: No  CIWA:  CIWA-Ar Total: 1 COWS:     Musculoskeletal: Strength & Muscle Tone: within normal limits Gait & Station: normal Patient leans: N/A  Psychiatric Specialty Exam: Physical Exam  Nursing note and vitals reviewed. Constitutional: She is oriented to person, place, and time.  Neurological: She is alert and oriented to person, place, and time.    Review of Systems  Psychiatric/Behavioral: Positive for substance abuse. Negative  for depression, hallucinations, memory loss and suicidal ideas. The patient is not nervous/anxious and does not have insomnia.   All other systems reviewed and are negative.   Blood pressure 135/71, pulse 62, temperature 97.9 F (36.6 C), temperature source Oral, resp. rate 16, height 5\' 6"  (1.676 m), weight 144 lb (65.3 kg), last menstrual period 12/29/2016, SpO2 100 %.Body mass index is 23.24 kg/m.  General Appearance: Fairly Groomed  Eye Contact:  Good  Speech:  Clear and Coherent and Normal Rate  Volume:  Normal  Mood:  Anxious  Affect:  anxious  Thought Process:  Coherent, Linear and Descriptions of Associations: Intact  Orientation:  Full (Time, Place, and Person)  Thought Content:  Rumination denies AVH  Suicidal Thoughts:  No  Homicidal Thoughts:  No  Memory:  Immediate;   Fair Recent;   Fair  Judgement:  Impaired  Insight:  Lacking  Psychomotor Activity:  Normal  Concentration:  Concentration: Fair and Attention Span: Fair  Recall:  [employer] of Knowledge:  Fair  Language:  Good  Akathisia:  Negative  Handed:  Right  AIMS (if indicated):     Assets:  Desire for Improvement Social Support  ADL's:  Intact  Cognition:  WNL  Sleep:  Number of Hours: 5.5     Treatment Plan Summary: Daily contact with patient to assess and evaluate symptoms and progress in treatment   Medication management: Psychiatric conditions are unstable at this time. To reduce current symptoms to base line and improve the patient's overall level of functioning will continue  The following plan without adjustments;   Major depression: Continue prozac 40 mg po daily   Alcohol use: Continue prn detox off alcohol.   Seizure disorder: Continue Keppra 1,000 mg po bid.   Anxiety-Continue: Vistaril 25 mg po q6 hours as needed.   Muscle Spasms: Continue Robaxin 500 mg po bid   Other:  Safety: Will continue 15 minute observation for safety checks. Patient is able to contract for safety on the  unit at this time  Continue to develop treatment plan to decrease risk of relapse upon discharge and to reduce the need for readmission.  Psycho-social education regarding relapse prevention and self care.  Health care follow up as needed for medical problems.  Continue to attend and participate in therapy.     Mordecai Maes, NP 01/18/2017, 1:51 PM

## 2017-01-18 NOTE — BHH Group Notes (Signed)
Pt attended spiritual care group on grief and loss facilitated by chaplain Majed Pellegrin   Group opened with brief discussion and psycho-social ed around grief and loss in relationships and in relation to self - identifying life patterns, circumstances, changes that cause losses. Established group norm of speaking from own life experience. Group goal of establishing open and affirming space for members to share loss and experience with grief, normalize grief experience and provide psycho social education and grief support.     

## 2017-01-18 NOTE — Progress Notes (Signed)
Patient ID: Kristen Roberson, female   DOB: 1975/12/10, 41 y.o.   MRN: [id number] D: Patient reports an improvement with her depressive symptoms.  She reports poor sleep (recorded 5.5 hours last night).  Patient rates her depression as a 3; denies any hopelessness and anxiety as a 4.  Patient is taking vistaril regularly for her anxiety.  Her goal today is to "focus on today."  She is interacting well with staff and peers.  Patient denies any thoughts of self harm.  She minimizes her attempt prior to admission, along with her relapse on alcohol.  Patient states, "I should be discharged today.  I'm not withdrawing."  Patient lost her apartment today and hopes her belongings will be saved by her 75.  She has an appointment with her outpatient provider upon discharge. A: Continue to monitor medication management and MD orders.  Safety checks completed every 15 minutes per protocol.  Offer support and encouragement as needed. R: Patient is receptive to staff; her behavior is appropriate.

## 2017-01-18 NOTE — Tx Team (Signed)
Interdisciplinary Treatment and Diagnostic Plan Update  01/18/2017 Time of Session: [place] MRN: [id number]  Principal Diagnosis: MDD, recurrent, severe  Secondary Diagnoses: Active Problems:   MDD (major depressive disorder)   Current Medications:  Current Facility-Administered Medications  Medication Dose Route Frequency Provider Last Rate Last Dose  . acetaminophen (TYLENOL) tablet 650 mg  650 mg Oral Q4H PRN Kerrie Buffalo, NP      . alum & mag hydroxide-simeth (MAALOX/MYLANTA) 200-200-20 MG/5ML suspension 30 mL  30 mL Oral Q6H PRN Kerrie Buffalo, NP      . benzonatate (TESSALON) capsule 100 mg  100 mg Oral TID PRN Kerrie Buffalo, NP      . FLUoxetine (PROZAC) capsule 40 mg  40 mg Oral Daily Kerrie Buffalo, NP   40 mg at 01/18/17 0804  . hydrOXYzine (ATARAX/VISTARIL) tablet 25 mg  25 mg Oral Q6H PRN Kerrie Buffalo, NP   25 mg at 01/18/17 0807  . ibuprofen (ADVIL,MOTRIN) tablet 600 mg  600 mg Oral Q8H PRN Kerrie Buffalo, NP   600 mg at 01/16/17 2226  . levETIRAcetam (KEPPRA) tablet 1,000 mg  1,000 mg Oral BID Kerrie Buffalo, NP   1,000 mg at 01/18/17 0804  . loperamide (IMODIUM) capsule 2-4 mg  2-4 mg Oral PRN Kerrie Buffalo, NP      . LORazepam (ATIVAN) tablet 1 mg  1 mg Oral Q6H PRN Kerrie Buffalo, NP   1 mg at 01/16/17 2226  . magnesium hydroxide (MILK OF MAGNESIA) suspension 30 mL  30 mL Oral Daily PRN Kerrie Buffalo, NP      . methocarbamol (ROBAXIN) tablet 500 mg  500 mg Oral BID Kerrie Buffalo, NP   500 mg at 01/18/17 0804  . multivitamin with minerals tablet 1 tablet  1 tablet Oral Daily Kerrie Buffalo, NP   1 tablet at 01/18/17 0805  . ondansetron (ZOFRAN) tablet 4 mg  4 mg Oral Q8H PRN Kerrie Buffalo, NP      . thiamine (VITAMIN B-1) tablet 100 mg  100 mg Oral Daily Kerrie Buffalo, NP   100 mg at 01/18/17 6812   Or  . thiamine (B-1) injection 100 mg  100 mg Intravenous Daily Kerrie Buffalo, NP       PTA Medications: Prescriptions Prior to  Admission  Medication Sig Dispense Refill Last Dose  . carvedilol (COREG) 3.125 MG tablet Take 0.5 tablets (1.5625 mg total) by mouth 2 (two) times daily with a meal. (Patient not taking: Reported on 09/07/2015) 30 tablet 0 Not Taking at Unknown time  . cephALEXin (KEFLEX) 500 MG capsule Take 1 capsule (500 mg total) by mouth 4 (four) times daily. (Patient not taking: Reported on 11/22/2016) 20 capsule 0 Not Taking at Unknown time  . clindamycin (CLEOCIN) 150 MG capsule Take 1 capsule (150 mg total) by mouth every 6 (six) hours. (Patient not taking: Reported on 11/22/2016) 28 capsule 0 Not Taking at Unknown time  . diazepam (VALIUM) 2 MG tablet Take 1 tablet (2 mg total) by mouth every 6 (six) hours as needed for anxiety. (Patient not taking: Reported on 11/05/2016) 5 tablet 0 Not Taking at Unknown time  . dicyclomine (BENTYL) 20 MG tablet Take 1 tablet (20 mg total) by mouth 2 (two) times daily. (Patient not taking: Reported on 11/29/2016) 20 tablet 0 Not Taking at Unknown time  . etodolac (LODINE) 300 MG capsule Take 1 capsule (300 mg total) by mouth every 8 (eight) hours. (Patient not taking: Reported on 01/07/2017) 21 capsule 0 Not Taking  at Unknown time  . FLUoxetine (PROZAC) 40 MG capsule Take 40 mg by mouth daily.   Past Week at Unknown time  . guaiFENesin (MUCINEX) 600 MG 12 hr tablet Take 1 tablet (600 mg total) by mouth 2 (two) times daily. 14 tablet 0   . HYDROcodone-acetaminophen (NORCO/VICODIN) 5-325 MG tablet Take 1 tablet by mouth every 4 (four) hours as needed. (Patient not taking: Reported on 01/07/2017) 6 tablet 0 Not Taking at Unknown time  . hydrOXYzine (ATARAX/VISTARIL) 25 MG tablet Take 1 tablet (25 mg total) by mouth every 6 (six) hours. (Patient not taking: Reported on 11/29/2016) 12 tablet 0 Not Taking at Unknown time  . ibuprofen (ADVIL,MOTRIN) 600 MG tablet Take 1 tablet (600 mg total) by mouth every 6 (six) hours as needed. (Patient not taking: Reported on 01/07/2017) 30 tablet 0 Not  Taking at Unknown time  . levETIRAcetam (KEPPRA) 1000 MG tablet Take 1 tablet (1,000 mg total) by mouth 2 (two) times daily. For seizure activities (Patient not taking: Reported on 01/06/2016) 60 tablet 0 Not Taking at Unknown time  . methocarbamol (ROBAXIN) 500 MG tablet Take 1 tablet (500 mg total) by mouth 2 (two) times daily. (Patient not taking: Reported on 10/26/2015) 20 tablet 0 Not Taking at Unknown time  . ondansetron (ZOFRAN ODT) 4 MG disintegrating tablet Take 1 tablet (4 mg total) by mouth every 8 (eight) hours as needed for nausea or vomiting. (Patient not taking: Reported on 11/29/2016) 20 tablet 0 Not Taking at Unknown time  . phenazopyridine (PYRIDIUM) 200 MG tablet Take 1 tablet (200 mg total) by mouth 3 (three) times daily. (Patient not taking: Reported on 11/22/2016) 6 tablet 0 Not Taking at Unknown time  . promethazine (PHENERGAN) 25 MG suppository Place 1 suppository (25 mg total) rectally every 6 (six) hours as needed for nausea or vomiting. (Patient not taking: Reported on 12/07/2015) 12 each 0 Not Taking at Unknown time    Patient Stressors: Marital or family conflict Substance abuse  Patient Strengths: Ability for insight Average or above average intelligence Capable of independent living General fund of knowledge  Treatment Modalities: Medication Management, Group therapy, Case management,  1 to 1 session with clinician, Psychoeducation, Recreational therapy.   Physician Treatment Plan for Primary Diagnosis: MDD, recurrent, severe Long Term Goal(s): Improvement in symptoms so as ready for discharge compliance with meds Improvement in symptoms so as ready for discharge sobriety   Short Term Goals: Ability to verbalize feelings will improve Ability to demonstrate self-control will improve Ability to identify and develop effective coping behaviors will improve Ability to maintain clinical measurements within normal limits will improve Ability to identify changes in  lifestyle to reduce recurrence of condition will improve Ability to disclose and discuss suicidal ideas Ability to maintain clinical measurements within normal limits will improve  Medication Management: Evaluate patient's response, side effects, and tolerance of medication regimen.  Therapeutic Interventions: 1 to 1 sessions, Unit Group sessions and Medication administration.  Evaluation of Outcomes: Adequate for discharge   Physician Treatment Plan for Secondary Diagnosis: Active Problems:   MDD (major depressive disorder)  Long Term Goal(s): Improvement in symptoms so as ready for discharge compliance with meds Improvement in symptoms so as ready for discharge sobriety   Short Term Goals: Ability to verbalize feelings will improve Ability to demonstrate self-control will improve Ability to identify and develop effective coping behaviors will improve Ability to maintain clinical measurements within normal limits will improve Ability to identify changes in lifestyle to reduce recurrence  of condition will improve Ability to disclose and discuss suicidal ideas Ability to maintain clinical measurements within normal limits will improve     Medication Management: Evaluate patient's response, side effects, and tolerance of medication regimen.  Therapeutic Interventions: 1 to 1 sessions, Unit Group sessions and Medication administration.  Evaluation of Outcomes: Adequate for discharge    RN Treatment Plan for Primary Diagnosis: MDD, recurrent, severe Long Term Goal(s): Knowledge of disease and therapeutic regimen to maintain health will improve  Short Term Goals: Ability to remain free from injury will improve, Ability to verbalize feelings will improve and Ability to disclose and discuss suicidal ideas  Medication Management: RN will administer medications as ordered by provider, will assess and evaluate patient's response and provide education to patient for prescribed medication.  RN will report any adverse and/or side effects to prescribing provider.  Therapeutic Interventions: 1 on 1 counseling sessions, Psychoeducation, Medication administration, Evaluate responses to treatment, Monitor vital signs and CBGs as ordered, Perform/monitor CIWA, COWS, AIMS and Fall Risk screenings as ordered, Perform wound care treatments as ordered.  Evaluation of Outcomes: Adequate for discharge    LCSW Treatment Plan for Primary Diagnosis: MDD, recurrent, severe Long Term Goal(s): Safe transition to appropriate next level of care at discharge, Engage patient in therapeutic group addressing interpersonal concerns.  Short Term Goals: Engage patient in aftercare planning with referrals and resources, Facilitate patient progression through stages of change regarding substance use diagnoses and concerns and Identify triggers associated with mental health/substance abuse issues  Therapeutic Interventions: Assess for all discharge needs, 1 to 1 time with Social worker, Explore available resources and support systems, Assess for adequacy in community support network, Educate family and significant other(s) on suicide prevention, Complete Psychosocial Assessment, Interpersonal group therapy.  Evaluation of Outcomes: Adequate for discharge    Progress in Treatment: Attending groups: Yes Participating in groups: Yes Taking medication as prescribed: Yes. Toleration medication: Yes. Family/Significant other contact made: Contact attempts made with pt's boyfriend. SPE completed with pt.  Patient understands diagnosis: Yes. Discussing patient identified problems/goals with staff: Yes. Medical problems stabilized or resolved: Yes. Denies suicidal/homicidal ideation: Yes. Issues/concerns per patient self-inventory: No. Other: n/a   New problem(s) identified: No, Describe:  n/a  New Short Term/Long Term Goal(s): detox, elimination of SI thoughts, development of comprehensive mental  wellness/sobriety plan; mood stabilization/medication stabilization.   Discharge Plan or Barriers:  Pt plans to follow-up with [facility] for outpatient mental health services. She currently lives in [place]. Pt is moving into apt at discharge. Pt given [place] pamphlet and AA/NA list for [facility] as well.   Reason for Continuation of Hospitalization: none  Estimated Length of Stay: Monday 01/18/17  Attendees: Patient: 01/18/2017 10:50 AM  Physician: Dr. Shea Evans MD 01/18/2017 10:50 AM  Nursing: Trinna Post RN; Chrys Racer RN 01/18/2017 10:50 AM  RN Care Manager: Lars Pinks CM 01/18/2017 10:50 AM  Social Worker: Press photographer, LCSW 01/18/2017 10:50 AM  Recreational Therapist: x 01/18/2017 10:50 AM  Other: Lindell Spar NP; Mordecai Maes NP 01/18/2017 10:50 AM  Other:  01/18/2017 10:50 AM  Other: 01/18/2017 10:50 AM    Scribe for Treatment Team: Kimber Relic Smart, LCSW 01/18/2017 10:50 AM

## 2017-01-18 NOTE — Plan of Care (Signed)
Problem: Education: Goal: Knowledge of disease or condition will improve Outcome: Progressing Pt attended and engage evening wrap up group this shift.

## 2017-01-19 MED ORDER — HYDROXYZINE HCL 25 MG PO TABS
25.0000 mg | ORAL_TABLET | Freq: Four times a day (QID) | ORAL | Status: DC | PRN
  Filled 2017-01-19: qty 10

## 2017-01-19 MED ORDER — LEVETIRACETAM 1000 MG PO TABS
1000.0000 mg | ORAL_TABLET | Freq: Two times a day (BID) | ORAL | 0 refills | Status: DC
Start: 2017-01-19 — End: 2017-10-29

## 2017-01-19 MED ORDER — TRAZODONE HCL 50 MG PO TABS
50.0000 mg | ORAL_TABLET | Freq: Every evening | ORAL | Status: DC | PRN
  Filled 2017-01-19 (×2): qty 14

## 2017-01-19 MED ORDER — TRAZODONE HCL 100 MG PO TABS: 100.0000 mg | ORAL_TABLET | Freq: Every evening | ORAL | 0 refills | Status: DC | PRN

## 2017-01-19 MED ORDER — METHOCARBAMOL 500 MG PO TABS: 500.0000 mg | ORAL_TABLET | Freq: Two times a day (BID) | ORAL | 0 refills | Status: DC

## 2017-01-19 MED ORDER — METHOCARBAMOL 500 MG PO TABS
500.0000 mg | ORAL_TABLET | Freq: Two times a day (BID) | ORAL | Status: DC
  Filled 2017-01-19: qty 14

## 2017-01-19 MED ORDER — ONDANSETRON 4 MG PO TBDP: 4.0000 mg | ORAL_TABLET | Freq: Three times a day (TID) | ORAL | 0 refills | Status: DC | PRN

## 2017-01-19 MED ORDER — FLUOXETINE HCL 40 MG PO CAPS: 40.0000 mg | ORAL_CAPSULE | Freq: Every day | ORAL | 0 refills | Status: DC

## 2017-01-19 MED ORDER — IBUPROFEN 600 MG PO TABS: 600.0000 mg | ORAL_TABLET | Freq: Four times a day (QID) | ORAL | 0 refills | Status: DC | PRN

## 2017-01-19 MED ORDER — FLUOXETINE HCL 20 MG PO CAPS
40.0000 mg | ORAL_CAPSULE | Freq: Every day | ORAL | Status: DC
  Filled 2017-01-19: qty 14

## 2017-01-19 MED ORDER — HYDROXYZINE HCL 25 MG PO TABS: ORAL_TABLET | ORAL | 0 refills | Status: DC

## 2017-01-19 MED ORDER — LEVETIRACETAM 500 MG PO TABS
1000.0000 mg | ORAL_TABLET | Freq: Two times a day (BID) | ORAL | Status: DC
  Filled 2017-01-19: qty 28

## 2017-01-19 MED ORDER — TRAZODONE HCL 100 MG PO TABS: ORAL_TABLET | ORAL | 0 refills | Status: DC

## 2017-01-19 MED ORDER — TRAZODONE HCL 100 MG PO TABS
100.0000 mg | ORAL_TABLET | Freq: Every day | ORAL | Status: DC
  Filled 2017-01-19: qty 7

## 2017-01-19 NOTE — Progress Notes (Signed)
Pt attend wrap up group. Her day was a 2. Her goal was moving out her apartment. She has problems trying to get this resolved. Pt said she attend all groups today.

## 2017-01-19 NOTE — BHH Group Notes (Signed)
[facility] Group Notes:  (Nursing/MHT/Case Management/Adjunct)  Date:  01/19/2017  Time:  0902  Type of Therapy:  Nurse Education  Participation Level:  Active  Participation Quality:  Appropriate  Affect:  Appropriate  Cognitive:  Appropriate  Insight:  Appropriate  Engagement in Group:  Engaged  Modes of Intervention:  Discussion, Education and Support  Summary of Progress/Problems: Discussed goal-setting and appropriate interaction on unit. Pt shared that she wants to start moving forward and start letting go.   [name] 01/19/2017, 9:54 AM

## 2017-01-19 NOTE — Progress Notes (Signed)
  [facility] Adult Case Management Discharge Plan :  Will you be returning to the same living situation after discharge:  Yes,  home. pt is moving into a new apt at discharge. At discharge, do you have transportation home?: Yes,  bus Do you have the ability to pay for your medications: Yes,  mental health  Release of information consent forms completed and submitted to medical records by CSW.  Patient to Follow up at: Follow-up Information    [facility] Follow up.   Specialty:  Behavioral Health Why:  Social worker will confirm your next appointment with Grover Canavan. Contact information: [address] [place] [phone]           Next level of care provider has access to [place] and Suicide Prevention discussed: Yes,  SPE completed with pt's boyfriend.PT also provided with [facility] pamphlet and Mobile Crisis information.   Have you used any form of tobacco in the last 30 days? (Cigarettes, Smokeless Tobacco, Cigars, and/or Pipes): No  Has patient been referred to the Quitline?: N/A patient is not a smoker  Patient has been referred for addiction treatment: Yes  Kimber Relic Smart LCSW 01/19/2017, 8:41 AM
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
## 2017-01-19 NOTE — Progress Notes (Signed)
Patient ID: Kristen Roberson, female   DOB: [birth year], 41 y.o.   MRN: [id number] NSG D/C Note:Pt denies si/hi at this time. States that she will comply with outpt services and take her meds as prescribed. D/C to lobby.

## 2017-01-19 NOTE — Discharge Summary (Signed)
Physician Discharge Summary Note  Patient:  Kristen Roberson is an 41 y.o., female MRN:  [id number] DOB:  [birth year] Patient phone:  [phone] (home)  Patient address:   [place] [address],  Total Time spent with patient: Greater than 30 minutes  Date of Admission:  01/16/2017  Date of Discharge: 01/19/2017  Reason for Admission:    Principal Problem: MDD (major depressive disorder)  Discharge Diagnoses: Patient Active Problem List   Diagnosis Date Noted  . MDD (major depressive disorder) [F32.9] 01/16/2017  . Alcohol dependence with withdrawal, uncomplicated ([place]) [W26.378] 03/17/2015  . Hepatic encephalopathy ([place]) [K72.90] 02/23/2015  . Seizure ([place]) [R56.9] 02/23/2015  . Pancytopenia ([place]) [H88.502] 02/23/2015  . Internal hemorrhoids [K64.8] 02/23/2015  . Alcohol intoxication ([place]) [F10.929]   . Hemorrhoid [K64.9]   . C. difficile colitis [A04.72]   . Alcohol abuse [F10.10]   . Elevated liver enzymes [R74.8]   . Colitis [K52.9] 12/12/2014  . Abrasion of wrist [S60.819A]   . Alcohol dependence with uncomplicated withdrawal ([place]) [F10.230]   . GAD (generalized anxiety disorder) [F41.1] 07/01/2014  . Alcohol withdrawal syndrome without complication ([place]) [D74.128] 06/30/2014  . MDD (major depressive disorder), recurrent episode, moderate ([place]) [F33.1]   . Alcohol use disorder, severe, dependence ([place]) [F10.20]   . Alcohol dependence with withdrawal with complication ([place]) [N86.767] 05/19/2014  . Thrombocytopenia ([place]) [D69.6] 05/07/2014  . Hypokalemia [E87.6] 05/07/2014  . Right sided weakness [R53.1] 04/15/2014  . Seizures ([place]) [R56.9] 04/04/2014  . Substance induced mood disorder ([place]) [F19.94] 03/26/2014  . Status post myocardial infarction [I25.2] 03/06/2014  . Depression [F32.9] 03/06/2014  . Substance abuse [F19.10] 02/22/2014  . Acute respiratory failure with hypoxia ([place]) [J96.01] 02/22/2014  . Ventricular fibrillation ([place]) [I49.01]  02/22/2014  . Acute systolic heart failure - s/p VF Cardiac Arrest [I50.21] 02/22/2014  . Acute encephalopathy [G93.40] 02/22/2014  . Acute confusional state [F05] 02/22/2014  . Moderate malnutrition ([place]) [E44.0] 02/21/2014  . Convulsions/seizures ([place]) [R56.9] 02/14/2014  . Cardiac arrest ([place]) [I46.9] 02/13/2014  . Alcohol dependence ([place]) [F10.20] 01/06/2014  . Severe alcohol use disorder ([place]) [F10.20] 01/06/2014  . Adjustment disorder with depressed mood [F43.21] 12/14/2013  . 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 Folate deficiency [E53.8] 12/11/2013  . Alcoholism ([place]) [F10.20] 12/11/2013  . Alcohol withdrawal ([place]) [F10.239] 12/11/2013  . Hypokalemia [E87.6] 12/11/2013  . Malnutrition of moderate degree ([place]) [E44.0] 12/10/2013  . Abdominal pain [R10.9] 11/25/2013  . Mallory-Weiss tear [K22.6] 11/25/2013  . Acute blood loss anemia [D62] 11/24/2013  . Hematemesis [K92.0] 11/23/2013  . Fatty liver [K76.0] 10/29/2013  . Internal hemorrhoids with other complication [M09.4] 70/96/2836  . Hematochezia [K92.1] 10/28/2013  . Normocytic anemia [D64.9] 10/28/2013  . GIB (gastrointestinal bleeding) [K92.2] 10/28/2013  . Anxiety [F41.9]   . Post-operative state [Z98.890] 09/29/2013  . Postoperative state [Z98.890] 09/28/2013  . Female pelvic pain [R10.2] 09/21/2013  . Menorrhagia [N92.0] 09/21/2013  . History of ovarian cyst [Z87.42] 09/21/2013  . 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 Dysmenorrhea [N94.6] 09/21/2013   Past Psychiatric History: Alcoholism, chronic, Major depression.  Past Medical History:  Past Medical History:  Diagnosis Date  . Alcohol abuse   . Anemia   . Anxiety   . Blood transfusion without reported diagnosis   . Cardiac arrest ([place])   . Cysts of both ovaries   . Depression   . Fatty liver 10/05/13  . Proctitis   . Seizures ([place])     Past Surgical History:  Procedure  Laterality Date  . APPENDECTOMY    . colitis    . COLONOSCOPY  N/A 09/30/2013   Procedure: COLONOSCOPY;  Surgeon: Lafayette Dragon, MD;  Location: [facility];  Service: Endoscopy;  Laterality: N/A;  . ESOPHAGOGASTRODUODENOSCOPY N/A 11/23/2013   Procedure: ESOPHAGOGASTRODUODENOSCOPY (EGD);  Surgeon: Jerene Bears, MD;  Location: [facility];  Service: Endoscopy;  Laterality: N/A;  . LAPAROSCOPIC APPENDECTOMY Right 09/28/2013   Procedure: APPENDECTOMY LAPAROSCOPIC;  Surgeon: Terrance Mass, MD;  Location: [facility];  Service: Gynecology;  Laterality: Right;  . LAPAROSCOPY N/A 09/28/2013   Procedure: LAPAROSCOPY OPERATIVE;  Surgeon: Terrance Mass, MD;  Location: [facility];  Service: Gynecology;  Laterality: N/A;  . LEFT AND RIGHT HEART CATHETERIZATION WITH CORONARY ANGIOGRAM N/A 02/23/2014   Procedure: LEFT AND RIGHT HEART CATHETERIZATION WITH CORONARY ANGIOGRAM;  Surgeon: Leonie Man, MD;  Location: [facility]ATH LAB;  Service: Cardiovascular;  Laterality: N/A;  . OVARIAN CYST REMOVAL    . SALPINGOOPHORECTOMY Right 09/28/2013   Procedure: SALPINGO OOPHORECTOMY;  Surgeon: Terrance Mass, MD;  Location: [facility];  Service: Gynecology;  Laterality: Right;   Family History:  Family History  Problem Relation Age of Onset  . Diabetes Mother   . Hyperlipidemia Mother   . Stroke Mother   . Diabetes Father    Family Psychiatric  History: See H&P  Social History:  History  Alcohol Use  . Yes    Comment: reports she has had some over the past week     History  Drug Use No    Social History   Social History  . Marital status: Legally Separated    Spouse name: N/A  . Number of children: N/A  . Years of education: N/A   Social History Main Topics  . Smoking status: Never Smoker  . Smokeless tobacco: Never Used  . Alcohol use Yes     Comment: reports she has had some over the past week  . Drug use: No  . Sexual activity: Not Asked   Other Topics Concern  . None   Social History Narrative  . None   Hospital Course: [facility]. Pt reported, not seeing her  boyfriend for a while so she decided to stay at his hotel, while he was at work. Pt reported, the hotel was infested with cockroaches. Pt reported, she received a call from her daughter. Pt reported, she cut both wrists then drank two 40 ounce beers, because she was lonely, she missed her boyfriend and being in that hotel with all those cockroaches. Pt reported, she was not trying to kill herself, she was seeking attention. Pt reported her mother died two days ago in [place]. Pt denied, current SI, HI, AVH. Patient  placed a 911 call to the emergency call center stating that she had cut her wrist and did want to live any more. Police responded and found the respondent in her room with both wrist having ben cut.  Upon her arrival & admision to the adult unit,Chanon was evaluated & her presenting symptoms identified. Her toxicology results showed a blood alcohol level of 331. However, she was not presenting with any substance withdrawal symptoms. She did not receive detoxification treatments as result, but was started on medication regimen for symptoms of depression. She was medicated & discharged on, Trazodone 100 mg for insomnia, Fluoxetine 40 mg for depression & Hydroxyzine 25 mg prn for anxiety. She was enrolled & encouraged to participate in the unit programming, she did & learned coping skills. She  presented other significant pre-existing health issues that required treatment or monitoring. She was resumed on all her pertinent home medications for those health issues. She tolerated her treatment regimen without any adverse effects or reactions reported.  During her hospital stay, Bethan was evaluated on daily basis by the clinical providers to assure her response to her treatment regimen.As her treatment progressed, improvement was noted as evidenced by her report of decreasing symptoms, improved mood, affect, medication tolerance & active participation in the unit programming.She was encouraged to update her  providers on his progress by daily completion of a self inventory, noting mood, mental status, pain, any new symptoms, anxiety and or concerns.  Dean's symptomssponded well to her treatment regimen combined with a therapeutic and supportive environment. Upon her hospital discharge, Carlesha was in much improved condition than upon admission.Her symptoms were reported as significantly decreased or resolved completely. She adamantly denies any SI/HI,  AVH, delusional thoughts & or paranoia. She was motivated to continue taking medication with a goal of continued improvement in mental health. She will continue psychiatric care on an outpatient basis as noted below. She is provided with all the necessary information required to make this appointment without problems. She received a 7 days worth, supply samples of her [facility] discharge medications. She left [facility] with all personal belongings in no apparent distress. Transportation per city bus.[place] assisted with bus pass.    Physical Findings: AIMS: Facial and Oral Movements Muscles of Facial Expression: None, normal Lips and Perioral Area: None, normal Jaw: None, normal Tongue: None, normal,Extremity Movements Upper (arms, wrists, hands, fingers): None, normal Lower (legs, knees, ankles, toes): None, normal, Trunk Movements Neck, shoulders, hips: None, normal, Overall Severity Severity of abnormal movements (highest score from questions above): None, normal Incapacitation due to abnormal movements: None, normal Patient's awareness of abnormal movements (rate only patient's report): No Awareness, Dental Status Current problems with teeth and/or dentures?: No Does patient usually wear dentures?: No  CIWA:  CIWA-Ar Total: 0 COWS:     Musculoskeletal: Strength & Muscle Tone: within normal limits Gait & Station: normal Patient leans: N/A  Psychiatric Specialty Exam: Review of Systems  Constitutional: Negative.   HENT: Negative.   Eyes: Negative.    Respiratory: Negative.   Cardiovascular: Negative.   Gastrointestinal: Negative.   Genitourinary: Negative.   Musculoskeletal: Negative.   Skin: Negative.   Neurological: Negative.   Endo/Heme/Allergies: Negative.   Psychiatric/Behavioral: Positive for depression (Stable) and substance abuse (Hx. Alcoholism, chronic). Negative for hallucinations, memory loss and suicidal ideas. The patient has insomnia (Stable). The patient is not nervous/anxious.   All other systems reviewed and are negative.   Blood pressure 112/78, pulse 100, temperature 97.9 F (36.6 C), temperature source Oral, resp. rate 18, height 5\' 6"  (1.676 m), weight 65.3 kg (144 lb), last menstrual period 12/29/2016, SpO2 100 %.Body mass index is 23.24 kg/m.  SEE MD PSE within the [place]   Have you used any form of tobacco in the last 30 days? (Cigarettes, Smokeless Tobacco, Cigars, and/or Pipes): No  Has this patient used any form of tobacco in the last 30 days? (Cigarettes, Smokeless Tobacco, Cigars, and/or Pipes) Yes, No  Metabolic Disorder Labs:  Lab Results  Component Value Date   HGBA1C 4.4 12/09/2013   MPG 80 12/09/2013   No results found for: PROLACTIN No results found for: CHOL, TRIG, HDL, CHOLHDL, VLDL, LDLCALC  See Psychiatric Specialty Exam and Suicide Risk Assessment completed by Attending Physician prior to discharge.  Discharge destination:  Home  Is patient on multiple antipsychotic therapies at discharge:  No   Has Patient had three or more failed trials of antipsychotic monotherapy by history:  No  Recommended Plan for Multiple Antipsychotic Therapies: NA  Allergies as of 01/19/2017      Reactions   Morphine And Related Anaphylaxis    Tolerated hydromorphone on 11/25/13.   Tramadol Other (See Comments)   Seizures   Penicillins Other (See Comments)   Unknown childhood reaction. Has patient had a PCN reaction causing immediate rash, facial/tongue/throat swelling, SOB or lightheadedness with  hypotension: Unknown Has patient had a PCN reaction causing severe rash involving mucus membranes or skin necrosis: Unknown Has patient had a PCN reaction that required hospitalization unknown Has patient had a PCN reaction occurring within the last 10 years: No If all of the above answers are "NO", then may proceed with Cephalosporin use.      Medication List    STOP taking these medications   carvedilol 3.125 MG tablet Commonly known as:  COREG   cephALEXin 500 MG capsule Commonly known as:  KEFLEX   clindamycin 150 MG capsule Commonly known as:  CLEOCIN   diazepam 2 MG tablet Commonly known as:  VALIUM   dicyclomine 20 MG tablet Commonly known as:  BENTYL   etodolac 300 MG capsule Commonly known as:  LODINE   guaiFENesin 600 MG 12 hr tablet Commonly known as:  MUCINEX   HYDROcodone-acetaminophen 5-325 MG tablet Commonly known as:  NORCO/VICODIN   phenazopyridine 200 MG tablet Commonly known as:  PYRIDIUM   promethazine 25 MG suppository Commonly known as:  PHENERGAN     TAKE these medications     Indication  FLUoxetine 40 MG capsule Commonly known as:  PROZAC Take 1 capsule (40 mg total) by mouth daily. For depression Start taking on:  01/20/2017 What changed:  additional instructions  Indication:  Major Depressive Disorder   hydrOXYzine 25 MG tablet Commonly known as:  ATARAX/VISTARIL Take 1 tablet (25 mg) Four times daily as needed: For anxiety What changed:  how much to take  how to take this  when to take this  additional instructions  Indication:  Anxiety Neurosis   ibuprofen 600 MG tablet Commonly known as:  ADVIL,MOTRIN Take 1 tablet (600 mg total) by mouth every 6 (six) hours as needed. (May purchase from over the counter): For moderate pain What changed:  additional instructions  Indication:  Moderate pain   levETIRAcetam 1000 MG tablet Commonly known as:  KEPPRA Take 1 tablet (1,000 mg total) by mouth 2 (two) times daily. For seizure  activities  Indication:  Seizures   methocarbamol 500 MG tablet Commonly known as:  ROBAXIN Take 1 tablet (500 mg total) by mouth 2 (two) times daily. For muscle spasms What changed:  additional instructions  Indication:  Musculoskeletal Pain   ondansetron 4 MG disintegrating tablet Commonly known as:  ZOFRAN ODT Take 1 tablet (4 mg total) by mouth every 8 (eight) hours as needed for nausea or vomiting.  Indication:  Nausea   traZODone 100 MG tablet Commonly known as:  DESYREL Take 1 tablet (100 mg total) by mouth at bedtime and may repeat dose one time if needed. Take (1 tablet (100 mg) at bedtime: For sleep  Indication:  Trouble Sleeping      Follow-up Information    [facility] Follow up on 01/22/2017.   Specialty:  Behavioral Health Why:  Hospital follow-up appt with Grover Canavan on Friday at 2:20PM.  Contact information: [address] [address] [phone]          Follow-up recommendations: Activity:  As tolerated Diet: As recommended by your primary care doctor. Keep all scheduled follow-up appointments as recommended.  Comments: Patient is instructed prior to discharge to: Take all medications as prescribed by his/her mental healthcare provider. Report any adverse effects and or reactions from the medicines to his/her outpatient provider promptly. Patient has been instructed & cautioned: To not engage in alcohol and or illegal drug use while on prescription medicines. In the event of worsening symptoms, patient is instructed to call the crisis hotline, 911 and or go to the nearest ED for appropriate evaluation and treatment of symptoms. To follow-up with his/her primary care provider for your other medical issues, concerns and or health care needs.    Signed: Encarnacion Slates, PMHNP, FNP-BC 01/19/2017, 10:22 AM

## 2017-03-15 ENCOUNTER — Encounter (HOSPITAL_COMMUNITY): Payer: Self-pay | Admitting: *Deleted

## 2017-03-15 ENCOUNTER — Emergency Department (HOSPITAL_COMMUNITY)
Admission: EM | Admit: 2017-03-15 | Discharge: 2017-03-15 | Disposition: A | Discharge status: Home / Self Care | Payer: Self-pay | Attending: Emergency Medicine | Admitting: Emergency Medicine

## 2017-03-15 DIAGNOSIS — R1030 Lower abdominal pain, unspecified: Secondary | ICD-10-CM | POA: Insufficient documentation

## 2017-03-15 DIAGNOSIS — R197 Diarrhea, unspecified: Secondary | ICD-10-CM | POA: Insufficient documentation

## 2017-03-15 DIAGNOSIS — Z5321 Procedure and treatment not carried out due to patient leaving prior to being seen by health care provider: Secondary | ICD-10-CM | POA: Insufficient documentation

## 2017-03-15 DIAGNOSIS — R110 Nausea: Secondary | ICD-10-CM | POA: Insufficient documentation

## 2017-03-15 LAB — URINALYSIS, ROUTINE W REFLEX MICROSCOPIC
BILIRUBIN URINE: NEGATIVE
Glucose, UA: NEGATIVE mg/dL
Hgb urine dipstick: NEGATIVE
KETONES UR: NEGATIVE mg/dL
LEUKOCYTES UA: NEGATIVE
NITRITE: NEGATIVE
PROTEIN: NEGATIVE mg/dL
Specific Gravity, Urine: <1.005 — ABNORMAL LOW (ref 1.005–1.030)
pH: 6 (ref 5.0–8.0)

## 2017-03-15 LAB — COMPREHENSIVE METABOLIC PANEL
ALK PHOS: 103 U/L (ref 38–126)
ALT: 41 U/L (ref 14–54)
ANION GAP: 11 (ref 5–15)
AST: 43 U/L — AB (ref 15–41)
Albumin: 4.4 g/dL (ref 3.5–5.0)
BILIRUBIN TOTAL: 0.8 mg/dL (ref 0.3–1.2)
BUN: 9 mg/dL (ref 6–20)
CALCIUM: 8.8 mg/dL — AB (ref 8.9–10.3)
CO2: 22 mmol/L (ref 22–32)
Chloride: 108 mmol/L (ref 101–111)
Creatinine, Ser: 0.53 mg/dL (ref 0.44–1.00)
GFR calc Af Amer: >60 mL/min (ref >60)
Glucose, Bld: 110 mg/dL — ABNORMAL HIGH (ref 65–99)
POTASSIUM: 3.7 mmol/L (ref 3.5–5.1)
Sodium: 141 mmol/L (ref 135–145)
TOTAL PROTEIN: 8 g/dL (ref 6.5–8.1)

## 2017-03-15 LAB — CBC
HEMATOCRIT: 28.6 % — AB (ref 36.0–46.0)
Hemoglobin: 8.6 g/dL — ABNORMAL LOW (ref 12.0–15.0)
MCH: 21.8 pg — ABNORMAL LOW (ref 26.0–34.0)
MCHC: 30.1 g/dL (ref 30.0–36.0)
MCV: 72.6 fL — ABNORMAL LOW (ref 78.0–100.0)
Platelets: 388 10*3/uL (ref 150–400)
RBC: 3.94 MIL/uL (ref 3.87–5.11)
RDW: 19.6 % — AB (ref 11.5–15.5)
WBC: 3.4 10*3/uL — AB (ref 4.0–10.5)

## 2017-03-15 LAB — LIPASE, BLOOD: Lipase: 25 U/L (ref 11–51)

## 2017-03-15 NOTE — ED Triage Notes (Addendum)
Per EMS, pt complains of lower abd pain x 6 days. Pt has nausea and diarrhea, no vomiting. Pt given ODT zofran en route to hospital.

## 2017-03-15 NOTE — ED Notes (Signed)
Pt called to reassess vitals.

## 2017-03-15 NOTE — ED Notes (Signed)
Patient called x 2-no answer from the waiting room

## 2017-04-05 ENCOUNTER — Encounter (HOSPITAL_COMMUNITY): Payer: Self-pay

## 2017-04-05 ENCOUNTER — Emergency Department (HOSPITAL_COMMUNITY): Payer: Self-pay

## 2017-04-05 ENCOUNTER — Emergency Department (HOSPITAL_COMMUNITY)
Admission: EM | Admit: 2017-04-05 | Discharge: 2017-04-05 | Disposition: A | Discharge status: Home / Self Care | Payer: Self-pay | Attending: Emergency Medicine | Admitting: Emergency Medicine

## 2017-04-05 DIAGNOSIS — I5020 Unspecified systolic (congestive) heart failure: Secondary | ICD-10-CM | POA: Insufficient documentation

## 2017-04-05 DIAGNOSIS — Y990 Civilian activity done for income or pay: Secondary | ICD-10-CM | POA: Insufficient documentation

## 2017-04-05 DIAGNOSIS — S8391XA Sprain of unspecified site of right knee, initial encounter: Secondary | ICD-10-CM | POA: Insufficient documentation

## 2017-04-05 DIAGNOSIS — W1781XA Fall down embankment (hill), initial encounter: Secondary | ICD-10-CM | POA: Insufficient documentation

## 2017-04-05 DIAGNOSIS — Y9389 Activity, other specified: Secondary | ICD-10-CM | POA: Insufficient documentation

## 2017-04-05 DIAGNOSIS — Z79899 Other long term (current) drug therapy: Secondary | ICD-10-CM | POA: Insufficient documentation

## 2017-04-05 DIAGNOSIS — Y92198 Other place in other specified residential institution as the place of occurrence of the external cause: Secondary | ICD-10-CM | POA: Insufficient documentation

## 2017-04-05 DIAGNOSIS — L03115 Cellulitis of right lower limb: Secondary | ICD-10-CM | POA: Insufficient documentation

## 2017-04-05 MED ORDER — CEPHALEXIN 500 MG PO CAPS: 500.0000 mg | ORAL_CAPSULE | Freq: Two times a day (BID) | ORAL | 0 refills | Status: DC

## 2017-04-05 MED ORDER — CEPHALEXIN 500 MG PO CAPS
500.0000 mg | ORAL_CAPSULE | Freq: Once | ORAL | Status: AC
  Administered 2017-04-05: 500 mg via ORAL
  Filled 2017-04-05: qty 1

## 2017-04-05 MED ORDER — KETOROLAC TROMETHAMINE 60 MG/2ML IM SOLN
60.0000 mg | Freq: Once | INTRAMUSCULAR | Status: AC
  Administered 2017-04-05: 60 mg via INTRAMUSCULAR
  Filled 2017-04-05: qty 2
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
## 2017-04-05 NOTE — ED Notes (Signed)
Patient ambulates to and from restroom without assistance.

## 2017-04-05 NOTE — ED Notes (Signed)
Discharge instructions reviewed with patient. Patient verbalizes understanding. VSS.   

## 2017-04-19 ENCOUNTER — Encounter (HOSPITAL_COMMUNITY): Payer: Self-pay | Admitting: *Deleted

## 2017-04-19 ENCOUNTER — Emergency Department (HOSPITAL_COMMUNITY)
Admission: EM | Admit: 2017-04-19 | Discharge: 2017-04-19 | Discharge status: Against Medical Advice | Payer: Self-pay | Attending: Emergency Medicine | Admitting: Emergency Medicine

## 2017-04-19 DIAGNOSIS — R2241 Localized swelling, mass and lump, right lower limb: Secondary | ICD-10-CM | POA: Insufficient documentation

## 2017-04-19 DIAGNOSIS — Z5321 Procedure and treatment not carried out due to patient leaving prior to being seen by health care provider: Secondary | ICD-10-CM | POA: Insufficient documentation

## 2017-04-19 NOTE — ED Triage Notes (Signed)
Pt was recently moving into her house and at 2am on Thursday night, she felt a "ouch" on her right foot.  Pt believes she was bit by a spider but pt is unsure what she was bitten by.  Insect bites noted to pt right foot and right hand.  Pt reports swelling to her foot, hand, and left elbow.  Pt reports feeling very thirsty.  Pt states she is having a hard time ambulating and moving her left elbow.  Pt has been taken alieve, motrin with minimum relief.

## 2017-04-19 NOTE — ED Provider Notes (Signed)
RN had me evaluate patient in triage as he felt she might be inappropriate for fast track. I performed a brief MSE. Patient is afebrile, non-toxic appearing, sitting comfortably on examination table. Vital signs reviewed and stable. No evidence of respiratory distress. Able to speak in full sentences without difficulty.  Patient reports being bit my multiple insect bites to her feet and arms. Patient denies any fevers, SOB, difficulty swallowing chest pain. Patient does have swelling to the insect bites on her feet and to the elbow. Limited ROM of secondary to swelling. Agreed with level of triage.  The patient appears stable so that the remainder of the MSE may be completed by another provider.   Volanda Napoleon, PA-C 78/58/85 [id number]    Delora Fuel, MD 41/28/78 2042

## 2017-04-19 NOTE — ED Notes (Signed)
No answer when called x 3.  

## 2017-04-19 NOTE — ED Triage Notes (Signed)
Pt also reports she has nausea and cold sweats a night.  Pt denies SOB.  Pt talking in complete sentences without issue.

## 2017-04-19 NOTE — ED Triage Notes (Signed)
Mendel Ryder, [place] reports she went into the triage room earlier today to assess patient and see if she would be able to be in fast track. She reported that patient would probably need IV antibiotics.

## 2017-04-20 ENCOUNTER — Emergency Department (HOSPITAL_COMMUNITY)
Admission: EM | Admit: 2017-04-20 | Discharge: 2017-04-20 | Disposition: A | Discharge status: Home / Self Care | Payer: Self-pay | Attending: Emergency Medicine | Admitting: Emergency Medicine

## 2017-04-20 ENCOUNTER — Emergency Department (HOSPITAL_COMMUNITY): Payer: Self-pay

## 2017-04-20 ENCOUNTER — Encounter (HOSPITAL_COMMUNITY): Payer: Self-pay | Admitting: Emergency Medicine

## 2017-04-20 DIAGNOSIS — M7989 Other specified soft tissue disorders: Secondary | ICD-10-CM

## 2017-04-20 DIAGNOSIS — W57XXXA Bitten or stung by nonvenomous insect and other nonvenomous arthropods, initial encounter: Secondary | ICD-10-CM | POA: Insufficient documentation

## 2017-04-20 DIAGNOSIS — Z79899 Other long term (current) drug therapy: Secondary | ICD-10-CM | POA: Insufficient documentation

## 2017-04-20 DIAGNOSIS — Y929 Unspecified place or not applicable: Secondary | ICD-10-CM | POA: Insufficient documentation

## 2017-04-20 DIAGNOSIS — Y939 Activity, unspecified: Secondary | ICD-10-CM | POA: Insufficient documentation

## 2017-04-20 DIAGNOSIS — L089 Local infection of the skin and subcutaneous tissue, unspecified: Secondary | ICD-10-CM | POA: Insufficient documentation

## 2017-04-20 DIAGNOSIS — Y998 Other external cause status: Secondary | ICD-10-CM | POA: Insufficient documentation

## 2017-04-20 DIAGNOSIS — R2242 Localized swelling, mass and lump, left lower limb: Secondary | ICD-10-CM | POA: Insufficient documentation

## 2017-04-20 MED ORDER — DOXYCYCLINE HYCLATE 100 MG PO CAPS: 100.0000 mg | ORAL_CAPSULE | Freq: Two times a day (BID) | ORAL | 0 refills | Status: DC

## 2017-04-20 MED ORDER — IBUPROFEN 800 MG PO TABS
800.0000 mg | ORAL_TABLET | Freq: Once | ORAL | Status: AC
  Administered 2017-04-20: 800 mg via ORAL
  Filled 2017-04-20: qty 1
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
## 2017-04-20 NOTE — ED Triage Notes (Signed)
Patient here via EMS from home with complaints of insect bites all over. Reports that she was seen yesterday for same. No itching, pain all over.

## 2017-04-20 NOTE — Discharge Instructions (Signed)
Your right hand is infected. X-rays do not show that the infection has spread to the bone. I have written a prescription for an antibiotic called doxycycline. Please take this medication twice a day for the next 10 days, start first dose tonight.  Please take ibuprofen for your pain. He may take 600 mg every 6 hours as needed for pain. I have written a note for work.  Please use the ankle brace for support of the left foot.  Please return to the emergency department if the redness/swelling/pain spreads despite taking antibiotics, if you develop a fever, or if you have any new or worsening symptoms.

## 2017-04-20 NOTE — ED Notes (Signed)
Bed: WTR8 Expected date:  Expected time:  Means of arrival:  Comments: EMS-insect bites

## 2017-07-10 ENCOUNTER — Encounter (HOSPITAL_COMMUNITY): Payer: Self-pay | Admitting: Emergency Medicine

## 2017-07-10 ENCOUNTER — Other Ambulatory Visit: Payer: Self-pay

## 2017-07-10 ENCOUNTER — Emergency Department (HOSPITAL_COMMUNITY): Payer: Self-pay

## 2017-07-10 ENCOUNTER — Emergency Department (HOSPITAL_COMMUNITY)
Admission: EM | Admit: 2017-07-10 | Discharge: 2017-07-10 | Disposition: A | Discharge status: Home / Self Care | Payer: Self-pay | Attending: Emergency Medicine | Admitting: Emergency Medicine

## 2017-07-10 DIAGNOSIS — Z79899 Other long term (current) drug therapy: Secondary | ICD-10-CM | POA: Insufficient documentation

## 2017-07-10 DIAGNOSIS — N939 Abnormal uterine and vaginal bleeding, unspecified: Secondary | ICD-10-CM | POA: Insufficient documentation

## 2017-07-10 LAB — CBC
HCT: 32.4 % — ABNORMAL LOW (ref 36.0–46.0)
Hemoglobin: 9.6 g/dL — ABNORMAL LOW (ref 12.0–15.0)
MCH: 22.4 pg — AB (ref 26.0–34.0)
MCHC: 29.6 g/dL — AB (ref 30.0–36.0)
MCV: 75.7 fL — ABNORMAL LOW (ref 78.0–100.0)
PLATELETS: 189 10*3/uL (ref 150–400)
RBC: 4.28 MIL/uL (ref 3.87–5.11)
RDW: 20.5 % — ABNORMAL HIGH (ref 11.5–15.5)
WBC: 3.4 10*3/uL — ABNORMAL LOW (ref 4.0–10.5)

## 2017-07-10 LAB — BASIC METABOLIC PANEL
ANION GAP: 12 (ref 5–15)
BUN: 8 mg/dL (ref 6–20)
CHLORIDE: 102 mmol/L (ref 101–111)
CO2: 19 mmol/L — AB (ref 22–32)
Calcium: 8.8 mg/dL — ABNORMAL LOW (ref 8.9–10.3)
Creatinine, Ser: 0.5 mg/dL (ref 0.44–1.00)
GFR calc Af Amer: >60 mL/min (ref >60)
GFR calc non Af Amer: >60 mL/min (ref >60)
Glucose, Bld: 112 mg/dL — ABNORMAL HIGH (ref 65–99)
POTASSIUM: 3.7 mmol/L (ref 3.5–5.1)
Sodium: 133 mmol/L — ABNORMAL LOW (ref 135–145)

## 2017-07-10 LAB — URINALYSIS, ROUTINE W REFLEX MICROSCOPIC
Bilirubin Urine: NEGATIVE
Glucose, UA: NEGATIVE mg/dL
Ketones, ur: NEGATIVE mg/dL
Leukocytes, UA: NEGATIVE
Nitrite: NEGATIVE
PH: 5 (ref 5.0–8.0)
Protein, ur: NEGATIVE mg/dL
SPECIFIC GRAVITY, URINE: 1.004 — AB (ref 1.005–1.030)

## 2017-07-10 LAB — WET PREP, GENITAL
CLUE CELLS WET PREP: NONE SEEN
SPERM: NONE SEEN
Trich, Wet Prep: NONE SEEN
Yeast Wet Prep HPF POC: NONE SEEN

## 2017-07-10 LAB — POC URINE PREG, ED: PREG TEST UR: NEGATIVE

## 2017-07-10 MED ORDER — OXYCODONE-ACETAMINOPHEN 5-325 MG PO TABS: 2.0000 | ORAL_TABLET | Freq: Four times a day (QID) | ORAL | 0 refills | Status: AC | PRN

## 2017-07-10 MED ORDER — FENTANYL CITRATE (PF) 100 MCG/2ML IJ SOLN
25.0000 ug | Freq: Once | INTRAMUSCULAR | Status: AC
  Administered 2017-07-10: 25 ug via INTRAVENOUS
  Filled 2017-07-10: qty 2

## 2017-07-10 MED ORDER — HYDROMORPHONE HCL 1 MG/ML IJ SOLN
1.0000 mg | Freq: Once | INTRAMUSCULAR | Status: AC
  Administered 2017-07-10: 1 mg via INTRAVENOUS
  Filled 2017-07-10: qty 1

## 2017-07-10 MED ORDER — ONDANSETRON HCL 4 MG/2ML IJ SOLN
4.0000 mg | Freq: Once | INTRAMUSCULAR | Status: AC
  Administered 2017-07-10: 4 mg via INTRAVENOUS
  Filled 2017-07-10: qty 2

## 2017-07-10 MED ORDER — KETOROLAC TROMETHAMINE 30 MG/ML IJ SOLN
30.0000 mg | Freq: Once | INTRAMUSCULAR | Status: AC
  Administered 2017-07-10: 30 mg via INTRAVENOUS
  Filled 2017-07-10: qty 1

## 2017-07-10 NOTE — Discharge Instructions (Signed)
Ultrasound shows thickened uterus lining. This could explain your symptoms. For pain take 1000 mg tylenol PLUS 800 mg ibuprofen. For more severe, break through pain, take percocet. Follow up with your primary care doctor for continued pain control and refills. Follow up with OBGYN ASAP for further evaluation.   Percocet is a narcotic pain medication that has risk of overdose, death, dependence and abuse. Mild and expected side effects include nausea, stomach upset, drowsiness, constipation. Do not consume alcohol, drive or use heavy machinery while taking this medication. Do not leave unattended around children. Flush any remaining pills that you do not use and do not share.  The emergency department has a strict policy regarding prescription of narcotic medications. We are unable to refill this medication in the emergency department for chronic pain. Contact your primary care provider or specialist for chronic pain management and refill on narcotic medications.

## 2017-07-10 NOTE — ED Notes (Signed)
Was unable to get blood 

## 2017-07-10 NOTE — ED Provider Notes (Signed)
[facility] EMERGENCY DEPARTMENT Provider Note   CSN: [id number] Arrival date & time: 07/10/17  9470     History   Chief Complaint Chief Complaint  Patient presents with  . Hip Pain  . Vaginal Bleeding    HPI Kristen Roberson is a 41 y.o. female with history of appendectomy presents to the ED for evaluation of vaginal bleeding for the last month. Describes it as dark, grape-sized blood clots. Vaginal bleeding occurs almost every other day. Has had to wear tampons and or large pads for bleeding. States that lifting heavy things makes the blood gush out more. Associated symptoms include diffuse lower abdominal pain worse on the right lower quadrant. Pain is better with walking, worse with outpatient and curling up in fetal position and lifting heavy things. However states that the pain and the bleeding don't always happen at the same time. Has tried ibuprofen and Tylenol without relief. Other associated symptoms include nausea, vomiting and diarrhea also for the last month. States she still has an appetite however. She developed right anterior lateral hip pain 2 weeks ago, states that she found her husband dead and tried to pick him up off the couch to put him on the ground to do CPR, she is afraid she may have hurt her hip. She reports history of right ovarian surgical removal. States she has been to [facility] ED multiple times in the last month for evaluation of vaginal bleeding and lower abdominal pain but has been unable to find an answer. I don't see any documentation of ED visits for this.   Last menstrual period 06/04/2017. No recent sexual activity. No birth control methods. Has not followed up with OB/GYN for this, last seen 3-4 months ago.  No fevers, CP, SOB, decreased appetite, dysuria, abnormal vaginal discharge, melena, hematochezia.   HPI  Past Medical History:  Diagnosis Date  . Alcohol abuse   . Anemia   . Anxiety   . Blood transfusion without reported diagnosis     . Cardiac arrest ([place])   . Cysts of both ovaries   . Depression   . Fatty liver 10/05/13  . Proctitis   . Seizures [facility])     Patient Active Problem List   Diagnosis Date Noted  . MDD (major depressive disorder) 01/16/2017  . Alcohol dependence with withdrawal, uncomplicated ([place]) 96/28/3662  . Hepatic encephalopathy ([place]) 02/23/2015  . Seizure ([place]) 02/23/2015  . Pancytopenia ([place]) 02/23/2015  . Internal hemorrhoids 02/23/2015  . Alcohol intoxication ([place])   . Hemorrhoid   . C. difficile colitis   . Alcohol abuse   . Elevated liver enzymes   . Colitis 12/12/2014  . Abrasion of wrist   . Alcohol dependence with uncomplicated withdrawal ([place])   . GAD (generalized anxiety disorder) 07/01/2014  . Alcohol withdrawal syndrome without complication ([place]) 94/76/5465  . MDD (major depressive disorder), recurrent episode, moderate ([place])   . Alcohol use disorder, severe, dependence ([place])   . Alcohol dependence with withdrawal with complication ([place]) 03/54/6568  . Thrombocytopenia ([place]) 05/07/2014  . Hypokalemia 05/07/2014  . Right sided weakness 04/15/2014  . Seizures ([place]) 04/04/2014  . Substance induced mood disorder ([place]) 03/26/2014  . Status post myocardial infarction 03/06/2014  . Depression 03/06/2014  . Substance abuse ([place]) 02/22/2014  . Acute respiratory failure with hypoxia ([place]) 02/22/2014  . Ventricular fibrillation ([place]) 02/22/2014  . Acute systolic heart failure - s/p VF Cardiac Arrest 02/22/2014  . Acute encephalopathy 02/22/2014  . Acute confusional state 02/22/2014  .  Moderate malnutrition ([place]) 02/21/2014  . Convulsions/seizures ([place]) 02/14/2014  . Cardiac arrest ([place]) 02/13/2014  . Alcohol dependence ([place]) 01/06/2014  . Severe alcohol use disorder ([place]) 01/06/2014  . Adjustment disorder with depressed mood 12/14/2013  . 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 Folate deficiency 12/11/2013  . Alcoholism ([place]) 12/11/2013  . Alcohol withdrawal ([place])  12/11/2013  . Hypokalemia 12/11/2013  . Malnutrition of moderate degree ([place]) 12/10/2013  . Abdominal pain 11/25/2013  . Mallory-Weiss tear 11/25/2013  . Acute blood loss anemia 11/24/2013  . Hematemesis 11/23/2013  . Fatty liver 10/29/2013  . Internal hemorrhoids with other complication 37/62/8315  . Hematochezia 10/28/2013  . Normocytic anemia 10/28/2013  . GIB (gastrointestinal bleeding) 10/28/2013  . Anxiety   . Post-operative state 09/29/2013  . Postoperative state 09/28/2013  . Female pelvic pain 09/21/2013  . Menorrhagia 09/21/2013  . History of ovarian cyst 09/21/2013  . Proctitis 09/21/2013  . Dysmenorrhea 09/21/2013    Past Surgical History:  Procedure Laterality Date  . APPENDECTOMY    . colitis    . COLONOSCOPY N/A 09/30/2013   Procedure: COLONOSCOPY;  Surgeon: Lafayette Dragon, MD;  Location: [facility] ENDOSCOPY;  Service: Endoscopy;  Laterality: N/A;  . ESOPHAGOGASTRODUODENOSCOPY N/A 11/23/2013   Procedure: ESOPHAGOGASTRODUODENOSCOPY (EGD);  Surgeon: Jerene Bears, MD;  Location: [facility];  Service: Endoscopy;  Laterality: N/A;  . LAPAROSCOPIC APPENDECTOMY Right 09/28/2013   Procedure: APPENDECTOMY LAPAROSCOPIC;  Surgeon: Terrance Mass, MD;  Location: [facility];  Service: Gynecology;  Laterality: Right;  . LAPAROSCOPY N/A 09/28/2013   Procedure: LAPAROSCOPY OPERATIVE;  Surgeon: Terrance Mass, MD;  Location: [facility];  Service: Gynecology;  Laterality: N/A;  . LEFT AND RIGHT HEART CATHETERIZATION WITH CORONARY ANGIOGRAM N/A 02/23/2014   Procedure: LEFT AND RIGHT HEART CATHETERIZATION WITH CORONARY ANGIOGRAM;  Surgeon: Leonie Man, MD;  Location: [facility] CATH LAB;  Service: Cardiovascular;  Laterality: N/A;  . OVARIAN CYST REMOVAL    . SALPINGOOPHORECTOMY Right 09/28/2013   Procedure: SALPINGO OOPHORECTOMY;  Surgeon: Terrance Mass, MD;  Location: [facility];  Service: Gynecology;  Laterality: Right;    OB History    Gravida Para Term Preterm AB Living   7 3     4 3    SAB TAB  Ectopic Multiple Live Births   4               Home Medications    Prior to Admission medications   Medication Sig Start Date End Date Taking? Authorizing Provider  FLUoxetine (PROZAC) 40 MG capsule Take 1 capsule (40 mg total) by mouth daily. For depression 01/20/17  Yes Nwoko, Herbert Pun I, NP  levETIRAcetam (KEPPRA) 1000 MG tablet Take 1 tablet (1,000 mg total) by mouth 2 (two) times daily. For seizure activities 01/19/17  Yes Lindell Spar I, NP  cephALEXin (KEFLEX) 500 MG capsule Take 1 capsule (500 mg total) by mouth 2 (two) times daily. Patient not taking: Reported on 07/10/2017 04/05/17   Blanchie Dessert, MD  doxycycline (VIBRAMYCIN) 100 MG capsule Take 1 capsule (100 mg total) by mouth 2 (two) times daily. Patient not taking: Reported on 07/10/2017 04/20/17   Glyn Ade, PA-C  hydrOXYzine (ATARAX/VISTARIL) 25 MG tablet Take 1 tablet (25 mg) Four times daily as needed: For anxiety Patient not taking: Reported on 07/10/2017 01/19/17   Lindell Spar I, NP  ibuprofen (ADVIL,MOTRIN) 600 MG tablet Take 1 tablet (600 mg total) by mouth every 6 (six) hours as needed. (May purchase from over the counter): For moderate  pain Patient not taking: Reported on 07/10/2017 01/19/17   Lindell Spar I, NP  methocarbamol (ROBAXIN) 500 MG tablet Take 1 tablet (500 mg total) by mouth 2 (two) times daily. For muscle spasms Patient not taking: Reported on 07/10/2017 01/19/17   Lindell Spar I, NP  ondansetron (ZOFRAN ODT) 4 MG disintegrating tablet Take 1 tablet (4 mg total) by mouth every 8 (eight) hours as needed for nausea or vomiting. Patient not taking: Reported on 07/10/2017 01/19/17   Lindell Spar I, NP  oxyCODONE-acetaminophen (PERCOCET/ROXICET) 5-325 MG tablet Take 2 tablets by mouth every 6 (six) hours as needed for up to 2 days for severe pain. 07/10/17 07/12/17  Kinnie Feil, PA-C  traZODone (DESYREL) 100 MG tablet Take (1 tablet (100 mg) at bedtime: For sleep Patient not taking: Reported on  07/10/2017 01/19/17   Encarnacion Slates, NP    Family History Family History  Problem Relation Age of Onset  . Diabetes Mother   . Hyperlipidemia Mother   . Stroke Mother   . Diabetes Father     Social History Social History   Tobacco Use  . Smoking status: Never Smoker  . Smokeless tobacco: Never Used  Substance Use Topics  . Alcohol use: Yes    Comment: reports she has had some over the past week  . Drug use: No     Allergies   Morphine and related; Tramadol; and Penicillins   Review of Systems Review of Systems  Gastrointestinal: Positive for abdominal pain.  Genitourinary: Positive for vaginal bleeding.  Musculoskeletal: Positive for arthralgias.  All other systems reviewed and are negative.    Physical Exam Updated Vital Signs BP (!) 121/92   Pulse 81   Temp 97.7 F (36.5 C) (Oral)   Resp 17   Ht 5\' 6"  (1.676 m)   Wt 65.8 kg (145 lb)   LMP 06/06/2017   SpO2 100%   BMI 23.40 kg/m   Physical Exam  Constitutional: She is oriented to person, place, and time. She appears well-developed and well-nourished. No distress.  NAD.  HENT:  Head: Normocephalic and atraumatic.  Right Ear: External ear normal.  Left Ear: External ear normal.  Nose: Nose normal.  Eyes: Conjunctivae and EOM are normal. No scleral icterus.  Neck: Normal range of motion. Neck supple.  Cardiovascular: Normal rate, regular rhythm and normal heart sounds.  No murmur heard. Pulmonary/Chest: Effort normal and breath sounds normal. She has no wheezes.  Abdominal: Soft. There is tenderness.  Diffuse low abdominal tenderness with light touch worse in LLQ and left ASIS. No suprapubic or CVA tenderness. No G/R/R.   Genitourinary: Cervix exhibits motion tenderness. Right adnexum displays tenderness. There is tenderness in the vagina.  Genitourinary Comments:  Chaperone at bedside during pelvic exam.  Patient reported discomfort throughout pelvic exam and speculum exam.  Scant amount of dark  blood in the vaginal vault and below the cervix.  CMT and right adnexal tenderness, without fullness. No left adnexal tenderness or fullness. Cervix is closed and w/o friability. Vaginal mucosa pink without lesions or tenderness.  External genitalia normal. No groin lymphadenopathy.   Musculoskeletal: Normal range of motion. She exhibits tenderness. She exhibits no deformity.  Diffuse tenderness to the ASIS on the right and lateral right hip, ecchymosis to this area. No pain with passive range of motion of the right hip or right lower extremity. No midline CTL spine tenderness or right SI joint tenderness.  Neurological: She is alert and oriented to person, place, and  time.  Skin: Skin is warm and dry. Capillary refill takes less than 2 seconds.  Psychiatric: She has a normal mood and affect. Her behavior is normal. Judgment and thought content normal.  Nursing note and vitals reviewed.    ED Treatments / Results  Labs (all labs ordered are listed, but only abnormal results are displayed) Labs Reviewed  WET PREP, GENITAL - Abnormal; Notable for the following components:      Result Value   WBC, Wet Prep HPF POC FEW (*)    All other components within normal limits  CBC - Abnormal; Notable for the following components:   WBC 3.4 (*)    Hemoglobin 9.6 (*)    HCT 32.4 (*)    MCV 75.7 (*)    MCH 22.4 (*)    MCHC 29.6 (*)    RDW 20.5 (*)    All other components within normal limits  URINALYSIS, ROUTINE W REFLEX MICROSCOPIC - Abnormal; Notable for the following components:   Color, Urine STRAW (*)    Specific Gravity, Urine 1.004 (*)    Hgb urine dipstick LARGE (*)    Bacteria, UA RARE (*)    Squamous Epithelial / LPF 0-5 (*)    All other components within normal limits  BASIC METABOLIC PANEL - Abnormal; Notable for the following components:   Sodium 133 (*)    CO2 19 (*)    Glucose, Bld 112 (*)    Calcium 8.8 (*)    All other components within normal limits  POC URINE PREG, ED    GC/CHLAMYDIA PROBE AMP ([place]) NOT AT [facility]    EKG  EKG Interpretation None       Radiology US Transvaginal Non-ob  Result Date: 07/10/2017 CLINICAL DATA:  Initial evaluation for vaginal bleeding for 1 month, right pelvic pain for 2 weeks. EXAM: TRANSABDOMINAL AND TRANSVAGINAL ULTRASOUND OF PELVIS DOPPLER ULTRASOUND OF OVARIES TECHNIQUE: Both transabdominal and transvaginal ultrasound examinations of the pelvis were performed. Transabdominal technique was performed for global imaging of the pelvis including uterus, ovaries, adnexal regions, and pelvic cul-de-sac. It was necessary to proceed with endovaginal exam following the transabdominal exam to visualize the uterus and ovaries. Color and duplex Doppler ultrasound was utilized to evaluate blood flow to the ovaries. COMPARISON:  Prior CT from 11/22/2016 FINDINGS: Uterus Measurements: 10.0 x 5.4 x 6.1 cm . No fibroids or other mass visualized. Nabothian cysts noted at the cervix. Endometrium Thickness: 18 mm. Endometrium demonstrates a heterogeneous echotexture without discrete lesion or other focal abnormality. Right ovary Surgically absent.  No adnexal mass. Left ovary Measurements: 3.5 x 2.5 x 2.8 cm. 3.3 x 1.7 x 2.7 cm somewhat oblong anechoic cystic lesion, likely an ovarian cyst. No internal vascularity or other complexity. Pulsed Doppler evaluation of the left ovary demonstrates normal low-resistance arterial and venous waveforms. Other findings No abnormal free fluid. IMPRESSION: 1. Thickened and heterogeneous endometrium measuring up to 18 mm. If bleeding remains unresponsive to hormonal or medical therapy, focal lesion work-up with sonohysterogram should be considered. Endometrial biopsy should also be considered in pre-menopausal patients at high risk for endometrial carcinoma. (Ref: Radiological Reasoning: Algorithmic Workup of Abnormal Vaginal Bleeding with Endovaginal Sonography and Sonohysterography. AJR 2008; 244:W10-27). 2. 3.3  cm simple left ovarian cyst. This is almost certainly benign, and no specific imaging follow up is recommended according to the Society of Radiologists in [place] Gordy Levan et al. Management of Asymptomatic Ovarian and Other Adnexal Cysts Imaged at [place]: Society of Radiologists  in Ultrasound Consensus Conference Statement 2010. Radiology 256 (Sept 2010): 943-954.). 3. Status post right oophorectomy.  No adnexal mass. 4. No other acute abnormality within the pelvis. No evidence for torsion. Electronically Signed   By: Jeannine Boga M.D.   On: 07/10/2017 16:02   US Pelvis Complete  Result Date: 07/10/2017 CLINICAL DATA:  Initial evaluation for vaginal bleeding for 1 month, right pelvic pain for 2 weeks. EXAM: TRANSABDOMINAL AND TRANSVAGINAL ULTRASOUND OF PELVIS DOPPLER ULTRASOUND OF OVARIES TECHNIQUE: Both transabdominal and transvaginal ultrasound examinations of the pelvis were performed. Transabdominal technique was performed for global imaging of the pelvis including uterus, ovaries, adnexal regions, and pelvic cul-de-sac. It was necessary to proceed with endovaginal exam following the transabdominal exam to visualize the uterus and ovaries. Color and duplex Doppler ultrasound was utilized to evaluate blood flow to the ovaries. COMPARISON:  Prior CT from 11/22/2016 FINDINGS: Uterus Measurements: 10.0 x 5.4 x 6.1 cm . No fibroids or other mass visualized. Nabothian cysts noted at the cervix. Endometrium Thickness: 18 mm. Endometrium demonstrates a heterogeneous echotexture without discrete lesion or other focal abnormality. Right ovary Surgically absent.  No adnexal mass. Left ovary Measurements: 3.5 x 2.5 x 2.8 cm. 3.3 x 1.7 x 2.7 cm somewhat oblong anechoic cystic lesion, likely an ovarian cyst. No internal vascularity or other complexity. Pulsed Doppler evaluation of the left ovary demonstrates normal low-resistance arterial and venous waveforms. Other findings No  abnormal free fluid. IMPRESSION: 1. Thickened and heterogeneous endometrium measuring up to 18 mm. If bleeding remains unresponsive to hormonal or medical therapy, focal lesion work-up with sonohysterogram should be considered. Endometrial biopsy should also be considered in pre-menopausal patients at high risk for endometrial carcinoma. (Ref: Radiological Reasoning: Algorithmic Workup of Abnormal Vaginal Bleeding with Endovaginal Sonography and Sonohysterography. AJR 2008; 627:O35-00). 2. 3.3 cm simple left ovarian cyst. This is almost certainly benign, and no specific imaging follow up is recommended according to the Society of Radiologists in [place] (D Clovis Riley et al. Management of Asymptomatic Ovarian and Other Adnexal Cysts Imaged at [place]: Society of Radiologists in [place] 2010. Radiology 256 (Sept 2010): 943-954.). 3. Status post right oophorectomy.  No adnexal mass. 4. No other acute abnormality within the pelvis. No evidence for torsion. Electronically Signed   By: Jeannine Boga M.D.   On: 07/10/2017 16:02   [place] Art/ven Flow Abd Pelv Doppler  Result Date: 07/10/2017 CLINICAL DATA:  Initial evaluation for vaginal bleeding for 1 month, right pelvic pain for 2 weeks. EXAM: TRANSABDOMINAL AND TRANSVAGINAL ULTRASOUND OF PELVIS DOPPLER ULTRASOUND OF OVARIES TECHNIQUE: Both transabdominal and transvaginal ultrasound examinations of the pelvis were performed. Transabdominal technique was performed for global imaging of the pelvis including uterus, ovaries, adnexal regions, and pelvic cul-de-sac. It was necessary to proceed with endovaginal exam following the transabdominal exam to visualize the uterus and ovaries. Color and duplex Doppler ultrasound was utilized to evaluate blood flow to the ovaries. COMPARISON:  Prior CT from 11/22/2016 FINDINGS: Uterus Measurements: 10.0 x 5.4 x 6.1 cm . No fibroids or other mass visualized. Nabothian  cysts noted at the cervix. Endometrium Thickness: 18 mm. Endometrium demonstrates a heterogeneous echotexture without discrete lesion or other focal abnormality. Right ovary Surgically absent.  No adnexal mass. Left ovary Measurements: 3.5 x 2.5 x 2.8 cm. 3.3 x 1.7 x 2.7 cm somewhat oblong anechoic cystic lesion, likely an ovarian cyst. No internal vascularity or other complexity. Pulsed Doppler evaluation of the left ovary demonstrates normal low-resistance arterial and  venous waveforms. Other findings No abnormal free fluid. IMPRESSION: 1. Thickened and heterogeneous endometrium measuring up to 18 mm. If bleeding remains unresponsive to hormonal or medical therapy, focal lesion work-up with sonohysterogram should be considered. Endometrial biopsy should also be considered in pre-menopausal patients at high risk for endometrial carcinoma. (Ref: Radiological Reasoning: Algorithmic Workup of Abnormal Vaginal Bleeding with Endovaginal Sonography and Sonohysterography. AJR 2008; 235:T73-22). 2. 3.3 cm simple left ovarian cyst. This is almost certainly benign, and no specific imaging follow up is recommended according to the Society of Radiologists in [place] (D Clovis Riley et al. Management of Asymptomatic Ovarian and Other Adnexal Cysts Imaged at [place]: Society of Radiologists in [place] 2010. Radiology 256 (Sept 2010): 943-954.). 3. Status post right oophorectomy.  No adnexal mass. 4. No other acute abnormality within the pelvis. No evidence for torsion. Electronically Signed   By: Jeannine Boga M.D.   On: 07/10/2017 16:02   Dg Hip Unilat W Or Wo Pelvis 2-3 Views Right  Result Date: 07/10/2017 CLINICAL DATA:  41 year old female with right hip pain for the past 2 weeks EXAM: DG HIP (WITH OR WITHOUT PELVIS) 2-3V RIGHT COMPARISON:  None. FINDINGS: There is no evidence of hip fracture or dislocation. There is no evidence of arthropathy or other  focal bone abnormality. IMPRESSION: Negative. Electronically Signed   By: Jacqulynn Cadet M.D.   On: 07/10/2017 15:01    Procedures Procedures (including critical care time)  Medications Ordered in ED Medications  ondansetron (ZOFRAN) injection 4 mg (4 mg Intravenous Given 07/10/17 1134)  fentaNYL (SUBLIMAZE) injection 25 mcg (25 mcg Intravenous Given 07/10/17 1134)  HYDROmorphone (DILAUDID) injection 1 mg (1 mg Intravenous Given 07/10/17 1241)  ketorolac (TORADOL) 30 MG/ML injection 30 mg (30 mg Intravenous Given 07/10/17 1600)  HYDROmorphone (DILAUDID) injection 1 mg (1 mg Intravenous Given 07/10/17 1600)     Initial Impression / Assessment and Plan / ED Course  I have reviewed the triage vital signs and the nursing notes.  Pertinent labs & imaging results that were available during my care of the patient were reviewed by me and considered in my medical decision making (see chart for details).  Clinical Course as of Jul 10 1620  Sat Jul 10, 2017  1320 Hemoglobin: (!) 9.6 [CG]  1354 Pt providing urine sample now.   [CG]    Clinical Course User Index [CG] Kinnie Feil, PA-C   41 year old female presents with vaginal bleeding associated with abdominal pain, nausea, vomiting, diarrhea. Ongoing for the last month. She reports she has been to [facility] ED multiple times for evaluation of this in the last month however I don't see any documentation to confirm this. She is status post appendectomy and reports she had her right ovary removed as well. On exam, she is nontoxic appearing. Vital signs are within normal limits. Abdomen is soft, normal bowel sounds. She has exquisite tenderness to very light palpation of the lower abdominal area, worse over the right ASIS and suprapubic region. She has ecchymosis to right ASIS, tender but denies trauma. She is requesting for pain control. Considering GU versus GI etiology although constellation of symptoms including vaginal bleeding plus nausea,  vomiting, diarrhea is atypical. We'll get lab work, pelvic exam and plan on imaging. She has had 10 CT AP this year.   Lab work remarkable for Hgb 9.6. Large hgb in urine but she is currently having vaginal bleeding. U/S shows thickened and heterogenous endometrium. X-ray of hip/pelvis unremarkable. Discussed  work up with pt. Will d/c with pain control and OBGYN f/u. She was advised to f/u with outpatient provider for chronic pain control as ED is unable to refill narcotic pain medications. Reviewed narcotic database, no recent narcotic rx. Will d/c with percocet, ibuprofen, tylenol.   Final Clinical Impressions(s) / ED Diagnoses   Final diagnoses:  Vaginal bleeding    ED Discharge Orders        Ordered    oxyCODONE-acetaminophen (PERCOCET/ROXICET) 5-325 MG tablet  Every 6 hours PRN     07/10/17 1620       Arlean Hopping 07/10/17 1621    Davonna Belling, MD 07/11/17 [id number]

## 2017-07-10 NOTE — ED Notes (Signed)
Patient transported to X-ray 

## 2017-07-10 NOTE — ED Triage Notes (Signed)
Pt. Stated, Kristen Roberson had vaginal bleeding  For a month and I have some rt. Hip pain.

## 2017-07-12 LAB — GC/CHLAMYDIA PROBE AMP (~~LOC~~) NOT AT ARMC
Chlamydia: NEGATIVE
Neisseria Gonorrhea: NEGATIVE

## 2017-07-14 ENCOUNTER — Encounter (HOSPITAL_COMMUNITY): Payer: Self-pay | Admitting: Nurse Practitioner

## 2017-07-14 ENCOUNTER — Emergency Department (HOSPITAL_COMMUNITY)
Admission: EM | Admit: 2017-07-14 | Discharge: 2017-07-14 | Disposition: A | Discharge status: Home / Self Care | Payer: Self-pay | Attending: Emergency Medicine | Admitting: Emergency Medicine

## 2017-07-14 ENCOUNTER — Other Ambulatory Visit: Payer: Self-pay

## 2017-07-14 DIAGNOSIS — N939 Abnormal uterine and vaginal bleeding, unspecified: Secondary | ICD-10-CM | POA: Insufficient documentation

## 2017-07-14 DIAGNOSIS — Z79899 Other long term (current) drug therapy: Secondary | ICD-10-CM | POA: Insufficient documentation

## 2017-07-14 DIAGNOSIS — R102 Pelvic and perineal pain: Secondary | ICD-10-CM | POA: Insufficient documentation

## 2017-07-14 DIAGNOSIS — R1030 Lower abdominal pain, unspecified: Secondary | ICD-10-CM

## 2017-07-14 LAB — CBC
HEMATOCRIT: 28.9 % — AB (ref 36.0–46.0)
HEMOGLOBIN: 8.7 g/dL — AB (ref 12.0–15.0)
MCH: 23 pg — AB (ref 26.0–34.0)
MCHC: 30.1 g/dL (ref 30.0–36.0)
MCV: 76.3 fL — ABNORMAL LOW (ref 78.0–100.0)
Platelets: 198 10*3/uL (ref 150–400)
RBC: 3.79 MIL/uL — ABNORMAL LOW (ref 3.87–5.11)
RDW: 20.7 % — ABNORMAL HIGH (ref 11.5–15.5)
WBC: 3.6 10*3/uL — ABNORMAL LOW (ref 4.0–10.5)

## 2017-07-14 LAB — BASIC METABOLIC PANEL
Anion gap: 10 (ref 5–15)
BUN: 8 mg/dL (ref 6–20)
CHLORIDE: 105 mmol/L (ref 101–111)
CO2: 21 mmol/L — AB (ref 22–32)
CREATININE: 0.5 mg/dL (ref 0.44–1.00)
Calcium: 8.4 mg/dL — ABNORMAL LOW (ref 8.9–10.3)
GFR calc non Af Amer: >60 mL/min (ref >60)
Glucose, Bld: 126 mg/dL — ABNORMAL HIGH (ref 65–99)
POTASSIUM: 3.3 mmol/L — AB (ref 3.5–5.1)
Sodium: 136 mmol/L (ref 135–145)

## 2017-07-14 LAB — PREGNANCY, URINE: Preg Test, Ur: NEGATIVE

## 2017-07-14 MED ORDER — NORETHINDRONE ACETATE 5 MG PO TABS: 5.0000 mg | ORAL_TABLET | Freq: Three times a day (TID) | ORAL | 0 refills | Status: DC

## 2017-07-14 MED ORDER — OXYCODONE-ACETAMINOPHEN 5-325 MG PO TABS: 1.0000 | ORAL_TABLET | ORAL | 0 refills | Status: DC | PRN

## 2017-07-14 MED ORDER — HYDROMORPHONE HCL 1 MG/ML IJ SOLN: 1.0000 mg | Freq: Once | INTRAMUSCULAR | Status: DC

## 2017-07-14 MED ORDER — HYDROMORPHONE HCL 1 MG/ML IJ SOLN
0.5000 mg | Freq: Once | INTRAMUSCULAR | Status: AC
  Administered 2017-07-14: 0.5 mg via INTRAVENOUS
  Filled 2017-07-14: qty 1

## 2017-07-14 NOTE — Discharge Instructions (Addendum)
Begin taking norethindrone 3 times daily as we discussed, if bleeding improves you can drop this down to twice a day and then once a day.  Please call today to schedule follow-up appointments at the [facility] clinic, as well as with Dr. Mardelle Matte with orthopedics for persisting hip pain.  If you have worsening bleeding or pain, please return to the ED or go to the MAU at [facility].

## 2017-07-14 NOTE — ED Notes (Signed)
Bed: IX18 Expected date:  Expected time:  Means of arrival:  Comments: EMS-vaginal bleeding

## 2017-07-14 NOTE — ED Provider Notes (Signed)
[facility] DEPT Provider Note   CSN: [id number] Arrival date & time: 07/14/17  1015     History   Chief Complaint No chief complaint on file.   HPI Kristen Roberson is a 41 y.o. female.  Kristen Roberson is a 41 y.o. Female with a history of appendectomy and right oophorectomy, presents complaining of persistent vaginal bleeding and lower abdominal pain. Pt was seen for the same at 12/22, discharged with outpt follow-up with OB-GYN, has not been able to follow up yet. Describes bleeding as dark, grape-sized blood clots. Vaginal bleeding has occurred daily since she was last seen. Reports she has been changing her large pads about every 2 hours. States that lifting heavy things makes the blood gush out more. Associated symptoms include diffuse lower abdominal pain worse on the right lower quadrant. Reports pain got worse today while she was trying to lift something at work. Pain is better with walking. She denies any associated fevers, upper abdominal pain, dysuria, vomiting or diarrhea. Pt denies any syncope, lightheadedness, dizziness, palpitation, CP, or SOB.  Pt also reports continued R hip pain, neg XR on 12/22. No new injury to hip, pt has been able to ambulate without difficulty, denies numbness, weakness or tingling.      Past Medical History:  Diagnosis Date  . Alcohol abuse   . Anemia   . Anxiety   . Blood transfusion without reported diagnosis   . Cardiac arrest ([place])   . Cysts of both ovaries   . Depression   . Fatty liver 10/05/13  . Proctitis   . Seizures [facility])     Patient Active Problem List   Diagnosis Date Noted  . MDD (major depressive disorder) 01/16/2017  . Alcohol dependence with withdrawal, uncomplicated ([place]) 35/36/1443  . Hepatic encephalopathy ([place]) 02/23/2015  . Seizure ([place]) 02/23/2015  . Pancytopenia ([place]) 02/23/2015  . Internal hemorrhoids 02/23/2015  . Alcohol intoxication ([place])   . Hemorrhoid   . C. difficile colitis    . Alcohol abuse   . Elevated liver enzymes   . Colitis 12/12/2014  . Abrasion of wrist   . Alcohol dependence with uncomplicated withdrawal ([place])   . GAD (generalized anxiety disorder) 07/01/2014  . Alcohol withdrawal syndrome without complication ([place]) 15/40/0867  . MDD (major depressive disorder), recurrent episode, moderate ([place])   . Alcohol use disorder, severe, dependence ([place])   . Alcohol dependence with withdrawal with complication ([place]) 61/95/0932  . Thrombocytopenia ([place]) 05/07/2014  . Hypokalemia 05/07/2014  . Right sided weakness 04/15/2014  . Seizures ([place]) 04/04/2014  . Substance induced mood disorder ([place]) 03/26/2014  . Status post myocardial infarction 03/06/2014  . Depression 03/06/2014  . Substance abuse ([place]) 02/22/2014  . Acute respiratory failure with hypoxia ([place]) 02/22/2014  . Ventricular fibrillation ([place]) 02/22/2014  . Acute systolic heart failure - s/p VF Cardiac Arrest 02/22/2014  . Acute encephalopathy 02/22/2014  . Acute confusional state 02/22/2014  . Moderate malnutrition ([place]) 02/21/2014  . Convulsions/seizures ([place]) 02/14/2014  . Cardiac arrest ([place]) 02/13/2014  . Alcohol dependence ([place]) 01/06/2014  . Severe alcohol use disorder ([place]) 01/06/2014  . Adjustment disorder with depressed mood 12/14/2013  . 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 Folate deficiency 12/11/2013  . Alcoholism ([place]) 12/11/2013  . Alcohol withdrawal ([place]) 12/11/2013  . Hypokalemia 12/11/2013  . Malnutrition of moderate degree ([place]) 12/10/2013  . Abdominal pain 11/25/2013  . Mallory-Weiss tear 11/25/2013  . Acute blood loss anemia 11/24/2013  . Hematemesis 11/23/2013  .  Fatty liver 10/29/2013  . Internal hemorrhoids with other complication 95/62/1308  . Hematochezia 10/28/2013  . Normocytic anemia 10/28/2013  . GIB (gastrointestinal bleeding) 10/28/2013  . Anxiety   . Post-operative state 09/29/2013  . Postoperative state 09/28/2013  . Female pelvic  pain 09/21/2013  . Menorrhagia 09/21/2013  . History of ovarian cyst 09/21/2013  . Proctitis 09/21/2013  . Dysmenorrhea 09/21/2013    Past Surgical History:  Procedure Laterality Date  . APPENDECTOMY    . colitis    . COLONOSCOPY N/A 09/30/2013   Procedure: COLONOSCOPY;  Surgeon: Lafayette Dragon, MD;  Location: [facility];  Service: Endoscopy;  Laterality: N/A;  . ESOPHAGOGASTRODUODENOSCOPY N/A 11/23/2013   Procedure: ESOPHAGOGASTRODUODENOSCOPY (EGD);  Surgeon: Jerene Bears, MD;  Location: [facility];  Service: Endoscopy;  Laterality: N/A;  . LAPAROSCOPIC APPENDECTOMY Right 09/28/2013   Procedure: APPENDECTOMY LAPAROSCOPIC;  Surgeon: Terrance Mass, MD;  Location: [facility];  Service: Gynecology;  Laterality: Right;  . LAPAROSCOPY N/A 09/28/2013   Procedure: LAPAROSCOPY OPERATIVE;  Surgeon: Terrance Mass, MD;  Location: [facility];  Service: Gynecology;  Laterality: N/A;  . LEFT AND RIGHT HEART CATHETERIZATION WITH CORONARY ANGIOGRAM N/A 02/23/2014   Procedure: LEFT AND RIGHT HEART CATHETERIZATION WITH CORONARY ANGIOGRAM;  Surgeon: Leonie Man, MD;  Location: [facility] CATH LAB;  Service: Cardiovascular;  Laterality: N/A;  . OVARIAN CYST REMOVAL    . SALPINGOOPHORECTOMY Right 09/28/2013   Procedure: SALPINGO OOPHORECTOMY;  Surgeon: Terrance Mass, MD;  Location: [facility];  Service: Gynecology;  Laterality: Right;    OB History    Gravida Para Term Preterm AB Living   7 3     4 3    SAB TAB Ectopic Multiple Live Births   4               Home Medications    Prior to Admission medications   Medication Sig Start Date End Date Taking? Authorizing Provider  FLUoxetine (PROZAC) 40 MG capsule Take 1 capsule (40 mg total) by mouth daily. For depression 01/20/17  Yes Nwoko, Herbert Pun I, NP  ibuprofen (ADVIL,MOTRIN) 200 MG tablet Take 400 mg by mouth every 6 (six) hours as needed for mild pain.   Yes [provider]  levETIRAcetam (KEPPRA) 1000 MG tablet Take 1 tablet (1,000 mg total) by mouth 2  (two) times daily. For seizure activities 01/19/17  Yes Lindell Spar I, NP  norethindrone (AYGESTIN) 5 MG tablet Take 1 tablet (5 mg total) by mouth 3 (three) times daily. You may decrease to twice daily and then once daily as bleeding improves 07/14/17 08/13/17  Jacqlyn Larsen, PA-C  oxyCODONE-acetaminophen (PERCOCET) 5-325 MG tablet Take 1-2 tablets by mouth every 4 (four) hours as needed. 07/14/17   Jacqlyn Larsen, PA-C    Family History Family History  Problem Relation Age of Onset  . Diabetes Mother   . Hyperlipidemia Mother   . Stroke Mother   . Diabetes Father     Social History Social History   Tobacco Use  . Smoking status: Never Smoker  . Smokeless tobacco: Never Used  Substance Use Topics  . Alcohol use: Yes    Comment: reports she has had some over the past week  . Drug use: No     Allergies   Morphine and related; Tramadol; and Penicillins   Review of Systems Review of Systems  Constitutional: Negative for chills and fever.  HENT: Negative for congestion, rhinorrhea and sore throat.   Eyes: Negative for discharge, redness  and itching.  Respiratory: Negative for cough and shortness of breath.   Cardiovascular: Negative for chest pain.  Gastrointestinal: Positive for abdominal pain. Negative for blood in stool, diarrhea, nausea and vomiting.  Genitourinary: Positive for pelvic pain, vaginal bleeding and vaginal pain. Negative for dysuria, frequency, hematuria and vaginal discharge.  Musculoskeletal: Positive for arthralgias (R hip). Negative for back pain, gait problem and joint swelling.  Skin: Negative for rash.  Neurological: Negative for dizziness, weakness, light-headedness and numbness.     Physical Exam Updated Vital Signs BP 126/87   Pulse 78   Temp 98.5 F (36.9 C) (Oral)   Resp 18   Ht 5\' 6"  (1.676 m)   Wt 65.8 kg (145 lb)   SpO2 99%   BMI 23.40 kg/m   Physical Exam  Constitutional: She is oriented to person, place, and time. She appears  well-developed and well-nourished. No distress.  HENT:  Head: Normocephalic and atraumatic.  Eyes: Right eye exhibits no discharge. Left eye exhibits no discharge.  Neck: Normal range of motion. Neck supple.  Cardiovascular: Normal rate, regular rhythm and normal heart sounds.  Pulmonary/Chest: Effort normal and breath sounds normal. No stridor. No respiratory distress. She has no wheezes. She has no rales.  Abdominal: Soft. Bowel sounds are normal. She exhibits no distension. There is tenderness.  Diffuse lower abdominal tenderness, slightly worse in the right lower quadrant, no CVA tenderness  Genitourinary:  Genitourinary Comments: Chest pain present at the bedside during pelvic exam. Small amount of present in the vaginal vault, no clots present, no active bleeding from the cervical, no discharge, no cervicitis.  On bimanual exam patient has tenderness over the right adnexa, no fullness, left nontender, no cervical motion tenderness  Musculoskeletal: She exhibits no edema or deformity.  Tenderness over the right hip, with mild ecchymosis which appears to be old and improving.  No pain with passive or active range of motion of the right hip or right lower extremity, L-spine nontender to palpation, no erythema or warmth, DP pulses 2+, good capillary refill, sensation intact  Neurological: She is alert and oriented to person, place, and time. Coordination normal.  Skin: Skin is warm and dry. Capillary refill takes less than 2 seconds. She is not diaphoretic.  Psychiatric: She has a normal mood and affect. Her behavior is normal.  Nursing note and vitals reviewed.    ED Treatments / Results  Labs (all labs ordered are listed, but only abnormal results are displayed) Labs Reviewed  CBC - Abnormal; Notable for the following components:      Result Value   WBC 3.6 (*)    RBC 3.79 (*)    Hemoglobin 8.7 (*)    HCT 28.9 (*)    MCV 76.3 (*)    MCH 23.0 (*)    RDW 20.7 (*)    All other  components within normal limits  BASIC METABOLIC PANEL - Abnormal; Notable for the following components:   Potassium 3.3 (*)    CO2 21 (*)    Glucose, Bld 126 (*)    Calcium 8.4 (*)    All other components within normal limits  PREGNANCY, URINE    EKG  EKG Interpretation None       Radiology No results found.  Procedures Procedures (including critical care time)  Medications Ordered in ED Medications  HYDROmorphone (DILAUDID) injection 0.5 mg (0.5 mg Intravenous Given 07/14/17 1328)  HYDROmorphone (DILAUDID) injection 0.5 mg (0.5 mg Intravenous Given 07/14/17 1542)     Initial  Impression / Assessment and Plan / ED Course  I have reviewed the triage vital signs and the nursing notes.  Pertinent labs & imaging results that were available during my care of the patient were reviewed by me and considered in my medical decision making (see chart for details).  Patient presents with persistent vaginal bleeding and lower abdominal pain, it was seen for this on 12/22, at which time ultrasound was completed and showed heterogeneous endometrial thickening, as well as a left-sided likely benign cyst.  Symptoms have not been improving, but are unchanged from previous evaluation.  On exam small amount of blood present in the vaginal vault, with tenderness primarily on the right side, patient has had both appendectomy and right-sided oophrectomy.  Pain is unchanged from previous visit, do not feel that additional imaging would be beneficial at this time.  Pain treated with improvement.  Patient's hemoglobin is stable at 8.7, see trend below.  Pregnancy negative, labs otherwise unremarkable.  Hemoglobin  Date Value Ref Range Status  07/14/2017 8.7 (L) 12.0 - 15.0 g/dL Final  07/10/2017 9.6 (L) 12.0 - 15.0 g/dL Final  03/15/2017 8.6 (L) 12.0 - 15.0 g/dL Final  01/15/2017 8.1 (L) 12.0 - 15.0 g/dL Final     Discussed case with Dr. Laurey Arrow at [facility], who suggests doing 5 mg  norethindrone 3 times daily which she can decrease to twice daily and then daily if bleeding improves.  Patient to follow-up with the [facility], also told patient about the MAU if bleeding worsens.    Patient to follow-up with Dr. Mardelle Matte with orthopedics regarding right hip pain, as patient has had no new injury, and had a negative x-ray on previous visit. Mild tenderness to palpation over hip but, neurovascularly intact and able to bear weight without difficulty.   Final Clinical Impressions(s) / ED Diagnoses   Final diagnoses:  Vaginal bleeding  Lower abdominal pain    ED Discharge Orders        Ordered    norethindrone (AYGESTIN) 5 MG tablet  3 times daily     07/14/17 1551    oxyCODONE-acetaminophen (PERCOCET) 5-325 MG tablet  Every 4 hours PRN     07/14/17 1551       Jacqlyn Larsen, [place] 07/14/17 1909    Gareth Morgan, MD 07/15/17 1330

## 2017-07-14 NOTE — ED Triage Notes (Signed)
[facility] was just seen [facility] a few days ago. Patient states her uterine lining isnt shedding properly and it is causing her to have heavy bleeding. Today at work patient felt a pop on the lower right abd area. Right side abd tenderness.

## 2017-09-17 ENCOUNTER — Emergency Department (HOSPITAL_COMMUNITY): Admission: EM | Admit: 2017-09-17 | Discharge: 2017-09-17 | Discharge status: Against Medical Advice | Payer: Self-pay

## 2017-09-17 NOTE — ED Triage Notes (Signed)
Per EMS- Patient has chronic right hip and abdominal pain. Patient was crying in triage. Patient got out of the wheelchair and stated she was not gong to stay and walked out of triage.

## 2017-09-17 NOTE — ED Notes (Signed)
Pt states she is not sitting in the lobby and she jumped up and walked out the door.

## 2017-09-19 ENCOUNTER — Encounter (HOSPITAL_BASED_OUTPATIENT_CLINIC_OR_DEPARTMENT_OTHER): Payer: Self-pay | Admitting: Adult Health

## 2017-09-19 ENCOUNTER — Other Ambulatory Visit: Payer: Self-pay

## 2017-09-19 ENCOUNTER — Observation Stay (HOSPITAL_BASED_OUTPATIENT_CLINIC_OR_DEPARTMENT_OTHER)
Admission: EM | Admit: 2017-09-19 | Discharge: 2017-09-20 | Disposition: A | Discharge status: Home / Self Care | Payer: Self-pay | Attending: Internal Medicine | Admitting: Internal Medicine

## 2017-09-19 DIAGNOSIS — Z880 Allergy status to penicillin: Secondary | ICD-10-CM | POA: Insufficient documentation

## 2017-09-19 DIAGNOSIS — Z8249 Family history of ischemic heart disease and other diseases of the circulatory system: Secondary | ICD-10-CM | POA: Insufficient documentation

## 2017-09-19 DIAGNOSIS — Z885 Allergy status to narcotic agent status: Secondary | ICD-10-CM | POA: Insufficient documentation

## 2017-09-19 DIAGNOSIS — Z833 Family history of diabetes mellitus: Secondary | ICD-10-CM | POA: Insufficient documentation

## 2017-09-19 DIAGNOSIS — G9340 Encephalopathy, unspecified: Secondary | ICD-10-CM | POA: Insufficient documentation

## 2017-09-19 DIAGNOSIS — Z888 Allergy status to other drugs, medicaments and biological substances status: Secondary | ICD-10-CM | POA: Insufficient documentation

## 2017-09-19 DIAGNOSIS — R112 Nausea with vomiting, unspecified: Secondary | ICD-10-CM

## 2017-09-19 DIAGNOSIS — F411 Generalized anxiety disorder: Secondary | ICD-10-CM | POA: Insufficient documentation

## 2017-09-19 DIAGNOSIS — Z823 Family history of stroke: Secondary | ICD-10-CM | POA: Insufficient documentation

## 2017-09-19 DIAGNOSIS — K226 Gastro-esophageal laceration-hemorrhage syndrome: Secondary | ICD-10-CM | POA: Insufficient documentation

## 2017-09-19 DIAGNOSIS — R109 Unspecified abdominal pain: Principal | ICD-10-CM | POA: Insufficient documentation

## 2017-09-19 DIAGNOSIS — D61818 Other pancytopenia: Secondary | ICD-10-CM | POA: Insufficient documentation

## 2017-09-19 DIAGNOSIS — N390 Urinary tract infection, site not specified: Secondary | ICD-10-CM

## 2017-09-19 DIAGNOSIS — A599 Trichomoniasis, unspecified: Secondary | ICD-10-CM

## 2017-09-19 DIAGNOSIS — R569 Unspecified convulsions: Secondary | ICD-10-CM | POA: Insufficient documentation

## 2017-09-19 DIAGNOSIS — D62 Acute posthemorrhagic anemia: Secondary | ICD-10-CM | POA: Insufficient documentation

## 2017-09-19 DIAGNOSIS — E538 Deficiency of other specified B group vitamins: Secondary | ICD-10-CM | POA: Insufficient documentation

## 2017-09-19 DIAGNOSIS — F4321 Adjustment disorder with depressed mood: Secondary | ICD-10-CM | POA: Insufficient documentation

## 2017-09-19 DIAGNOSIS — E876 Hypokalemia: Secondary | ICD-10-CM | POA: Insufficient documentation

## 2017-09-19 DIAGNOSIS — E860 Dehydration: Secondary | ICD-10-CM

## 2017-09-19 DIAGNOSIS — F10229 Alcohol dependence with intoxication, unspecified: Secondary | ICD-10-CM | POA: Insufficient documentation

## 2017-09-19 DIAGNOSIS — R197 Diarrhea, unspecified: Secondary | ICD-10-CM

## 2017-09-19 DIAGNOSIS — I252 Old myocardial infarction: Secondary | ICD-10-CM | POA: Insufficient documentation

## 2017-09-19 DIAGNOSIS — N946 Dysmenorrhea, unspecified: Secondary | ICD-10-CM | POA: Insufficient documentation

## 2017-09-19 DIAGNOSIS — Z79899 Other long term (current) drug therapy: Secondary | ICD-10-CM | POA: Insufficient documentation

## 2017-09-19 LAB — I-STAT VENOUS BLOOD GAS, ED
Acid-base deficit: 7 mmol/L — ABNORMAL HIGH (ref 0.0–2.0)
Bicarbonate: 17.1 mmol/L — ABNORMAL LOW (ref 20.0–28.0)
O2 SAT: 90 %
PCO2 VEN: 27.8 mmHg — AB (ref 44.0–60.0)
Patient temperature: 98.8
TCO2: 18 mmol/L — AB (ref 22–32)
pH, Ven: 7.397 (ref 7.250–7.430)
pO2, Ven: 57 mmHg — ABNORMAL HIGH (ref 32.0–45.0)

## 2017-09-19 LAB — URINALYSIS, ROUTINE W REFLEX MICROSCOPIC

## 2017-09-19 LAB — URINALYSIS, MICROSCOPIC (REFLEX)

## 2017-09-19 LAB — COMPREHENSIVE METABOLIC PANEL
ALT: 27 U/L (ref 14–54)
AST: 76 U/L — AB (ref 15–41)
Albumin: 4.2 g/dL (ref 3.5–5.0)
Alkaline Phosphatase: 114 U/L (ref 38–126)
Anion gap: 16 — ABNORMAL HIGH (ref 5–15)
BILIRUBIN TOTAL: 0.6 mg/dL (ref 0.3–1.2)
BUN: 8 mg/dL (ref 6–20)
CO2: 18 mmol/L — ABNORMAL LOW (ref 22–32)
CREATININE: 0.52 mg/dL (ref 0.44–1.00)
Calcium: 8.6 mg/dL — ABNORMAL LOW (ref 8.9–10.3)
Chloride: 98 mmol/L — ABNORMAL LOW (ref 101–111)
Glucose, Bld: 104 mg/dL — ABNORMAL HIGH (ref 65–99)
Potassium: 3.2 mmol/L — ABNORMAL LOW (ref 3.5–5.1)
Sodium: 132 mmol/L — ABNORMAL LOW (ref 135–145)
TOTAL PROTEIN: 7.8 g/dL (ref 6.5–8.1)

## 2017-09-19 LAB — CBC
HCT: 28.2 % — ABNORMAL LOW (ref 36.0–46.0)
Hemoglobin: 8.9 g/dL — ABNORMAL LOW (ref 12.0–15.0)
MCH: 24.1 pg — ABNORMAL LOW (ref 26.0–34.0)
MCHC: 31.6 g/dL (ref 30.0–36.0)
MCV: 76.2 fL — ABNORMAL LOW (ref 78.0–100.0)
PLATELETS: 215 10*3/uL (ref 150–400)
RBC: 3.7 MIL/uL — ABNORMAL LOW (ref 3.87–5.11)
RDW: 18.8 % — AB (ref 11.5–15.5)
WBC: 5 10*3/uL (ref 4.0–10.5)

## 2017-09-19 LAB — LIPASE, BLOOD: Lipase: 22 U/L (ref 11–51)

## 2017-09-19 LAB — PREGNANCY, URINE: PREG TEST UR: NEGATIVE

## 2017-09-19 LAB — WET PREP, GENITAL
Clue Cells Wet Prep HPF POC: NONE SEEN
SPERM: NONE SEEN
YEAST WET PREP: NONE SEEN

## 2017-09-19 LAB — ETHANOL: Alcohol, Ethyl (B): 131 mg/dL — ABNORMAL HIGH (ref <10)

## 2017-09-19 LAB — MAGNESIUM: Magnesium: 1.7 mg/dL (ref 1.7–2.4)

## 2017-09-19 MED ORDER — ONDANSETRON 4 MG PO TBDP
4.0000 mg | ORAL_TABLET | Freq: Once | ORAL | Status: AC | PRN
  Administered 2017-09-19: 4 mg via ORAL
  Filled 2017-09-19: qty 1

## 2017-09-19 MED ORDER — ONDANSETRON 4 MG PO TBDP
ORAL_TABLET | ORAL | Status: AC
  Filled 2017-09-19: qty 1

## 2017-09-19 MED ORDER — POTASSIUM CHLORIDE CRYS ER 20 MEQ PO TBCR
40.0000 meq | EXTENDED_RELEASE_TABLET | Freq: Once | ORAL | Status: DC
  Filled 2017-09-19: qty 2

## 2017-09-19 MED ORDER — FENTANYL CITRATE (PF) 100 MCG/2ML IJ SOLN
50.0000 ug | Freq: Once | INTRAMUSCULAR | Status: AC
  Administered 2017-09-19: 50 ug via INTRAVENOUS
  Filled 2017-09-19: qty 2

## 2017-09-19 MED ORDER — FENTANYL CITRATE (PF) 100 MCG/2ML IJ SOLN
100.0000 ug | Freq: Once | INTRAMUSCULAR | Status: AC
  Administered 2017-09-19: 100 ug via INTRAVENOUS
  Filled 2017-09-19: qty 2

## 2017-09-19 MED ORDER — ONDANSETRON 4 MG PO TBDP
4.0000 mg | ORAL_TABLET | Freq: Once | ORAL | Status: AC | PRN
  Administered 2017-09-19: 4 mg via ORAL

## 2017-09-19 NOTE — ED Notes (Signed)
Pelvic exam in progress, EDP at [facility] with chaperone.

## 2017-09-19 NOTE — ED Triage Notes (Addendum)
Pt lying on couch in lobby. VS updated in lobby per family request rather than bringing pt to triage room. Pt moaning "I can't take anymore" after she was wakened for VS. Delay explained. Pt voiced understanding.

## 2017-09-19 NOTE — ED Triage Notes (Signed)
PResents with pain to lower abdomen and nausea, vomiting and inability to keep fluids down. She also states is bleeding from both ends, vagina and rectum. She has some bruising to her right hip. She says the bruising keeps popping up. The pain and vomiting has been ongoing for 4-5 days. She describes the pain as excrutiting.

## 2017-09-19 NOTE — ED Notes (Signed)
EDP at [facility] 

## 2017-09-19 NOTE — ED Provider Notes (Signed)
[facility] EMERGENCY DEPARTMENT Provider Note   CSN: [id number] Arrival date & time: 09/19/17  1809     History   Chief Complaint Chief Complaint  Patient presents with  . Abdominal Pain    HPI Kristen Roberson is a 42 y.o. female.  The history is provided by the patient. No language interpreter was used.  Abdominal Pain      Kristen Roberson is a 42 y.o. female who presents to the Emergency Department complaining of abdominal pain, vomiting.  She has been sick for 4-5 days. Sxs started with lower abdominal pain that radiate to the radiates to the right hip.  Pain is constant but worsens at time.  Pain is sharp and stabbing in nature.  Denies fevers.  She has experienced numerous episodes of emesis daily for the last five days.  She has associated diarrhea, numerous watery stools for the last 10 days.  She has experienced vaginal bleeding since before christmas.  She has had right hip pain for the last several months -no history of trauma.  No dysuria.  Denies alcohol, tobacco, drug use.  She states that she is recovering from alcohol abuse.  Past Medical History:  Diagnosis Date  . Alcohol abuse   . Anemia   . Anxiety   . Blood transfusion without reported diagnosis   . Cardiac arrest ([place])   . Cysts of both ovaries   . Depression   . Fatty liver 10/05/13  . Proctitis   . Seizures [facility])     Patient Active Problem List   Diagnosis Date Noted  . MDD (major depressive disorder) 01/16/2017  . Alcohol dependence with withdrawal, uncomplicated ([place]) 67/61/9509  . Hepatic encephalopathy ([place]) 02/23/2015  . Seizure ([place]) 02/23/2015  . Pancytopenia ([place]) 02/23/2015  . Internal hemorrhoids 02/23/2015  . Alcohol intoxication ([place])   . Hemorrhoid   . C. difficile colitis   . Alcohol abuse   . Elevated liver enzymes   . Colitis 12/12/2014  . Abrasion of wrist   . Alcohol dependence with uncomplicated withdrawal ([place])   . GAD (generalized anxiety disorder) 07/01/2014  .  Alcohol withdrawal syndrome without complication ([place]) 32/67/1245  . MDD (major depressive disorder), recurrent episode, moderate ([place])   . Alcohol use disorder, severe, dependence ([place])   . Alcohol dependence with withdrawal with complication ([place]) 80/99/8338  . Thrombocytopenia ([place]) 05/07/2014  . Hypokalemia 05/07/2014  . Right sided weakness 04/15/2014  . Seizures ([place]) 04/04/2014  . Substance induced mood disorder ([place]) 03/26/2014  . Status post myocardial infarction 03/06/2014  . Depression 03/06/2014  . Substance abuse ([place]) 02/22/2014  . Acute respiratory failure with hypoxia ([place]) 02/22/2014  . Ventricular fibrillation ([place]) 02/22/2014  . Acute systolic heart failure - s/p VF Cardiac Arrest 02/22/2014  . Acute encephalopathy 02/22/2014  . Acute confusional state 02/22/2014  . Moderate malnutrition ([place]) 02/21/2014  . Convulsions/seizures ([place]) 02/14/2014  . Cardiac arrest ([place]) 02/13/2014  . Alcohol dependence ([place]) 01/06/2014  . Severe alcohol use disorder ([place]) 01/06/2014  . Adjustment disorder with depressed mood 12/14/2013  . 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 Folate deficiency 12/11/2013  . Alcoholism ([place]) 12/11/2013  . Alcohol withdrawal ([place]) 12/11/2013  . Hypokalemia 12/11/2013  . Malnutrition of moderate degree ([place]) 12/10/2013  . Abdominal pain 11/25/2013  . Mallory-Weiss tear 11/25/2013  . Acute blood loss anemia 11/24/2013  . Hematemesis 11/23/2013  . Fatty liver 10/29/2013  . Internal hemorrhoids with other complication 25/11/3974  . Hematochezia 10/28/2013  .  Normocytic anemia 10/28/2013  . GIB (gastrointestinal bleeding) 10/28/2013  . Anxiety   . Post-operative state 09/29/2013  . Postoperative state 09/28/2013  . Female pelvic pain 09/21/2013  . Menorrhagia 09/21/2013  . History of ovarian cyst 09/21/2013  . Proctitis 09/21/2013  . Dysmenorrhea 09/21/2013    Past Surgical History:  Procedure Laterality Date  . APPENDECTOMY     . colitis    . COLONOSCOPY N/A 09/30/2013   Procedure: COLONOSCOPY;  Surgeon: Lafayette Dragon, MD;  Location: [facility];  Service: Endoscopy;  Laterality: N/A;  . ESOPHAGOGASTRODUODENOSCOPY N/A 11/23/2013   Procedure: ESOPHAGOGASTRODUODENOSCOPY (EGD);  Surgeon: Jerene Bears, MD;  Location: [facility];  Service: Endoscopy;  Laterality: N/A;  . LAPAROSCOPIC APPENDECTOMY Right 09/28/2013   Procedure: APPENDECTOMY LAPAROSCOPIC;  Surgeon: Terrance Mass, MD;  Location: [facility];  Service: Gynecology;  Laterality: Right;  . LAPAROSCOPY N/A 09/28/2013   Procedure: LAPAROSCOPY OPERATIVE;  Surgeon: Terrance Mass, MD;  Location: [facility];  Service: Gynecology;  Laterality: N/A;  . LEFT AND RIGHT HEART CATHETERIZATION WITH CORONARY ANGIOGRAM N/A 02/23/2014   Procedure: LEFT AND RIGHT HEART CATHETERIZATION WITH CORONARY ANGIOGRAM;  Surgeon: Leonie Man, MD;  Location: [facility];  Service: Cardiovascular;  Laterality: N/A;  . OVARIAN CYST REMOVAL    . SALPINGOOPHORECTOMY Right 09/28/2013   Procedure: SALPINGO OOPHORECTOMY;  Surgeon: Terrance Mass, MD;  Location: [facility];  Service: Gynecology;  Laterality: Right;    OB History    Gravida Para Term Preterm AB Living   7 3     4 3    SAB TAB Ectopic Multiple Live Births   4               Home Medications    Prior to Admission medications   Medication Sig Start Date End Date Taking? Authorizing Provider  FLUoxetine (PROZAC) 40 MG capsule Take 1 capsule (40 mg total) by mouth daily. For depression 01/20/17   Lindell Spar I, NP  ibuprofen (ADVIL,MOTRIN) 200 MG tablet Take 400 mg by mouth every 6 (six) hours as needed for mild pain.    [provider]  levETIRAcetam (KEPPRA) 1000 MG tablet Take 1 tablet (1,000 mg total) by mouth 2 (two) times daily. For seizure activities 01/19/17   Lindell Spar I, NP  norethindrone (AYGESTIN) 5 MG tablet Take 1 tablet (5 mg total) by mouth 3 (three) times daily. You may decrease to twice daily and then once  daily as bleeding improves 07/14/17 08/13/17  Jacqlyn Larsen, PA-C  oxyCODONE-acetaminophen (PERCOCET) 5-325 MG tablet Take 1-2 tablets by mouth every 4 (four) hours as needed. 07/14/17   Jacqlyn Larsen, PA-C    Family History Family History  Problem Relation Age of Onset  . Diabetes Mother   . Hyperlipidemia Mother   . Stroke Mother   . Diabetes Father     Social History Social History   Tobacco Use  . Smoking status: Never Smoker  . Smokeless tobacco: Never Used  Substance Use Topics  . Alcohol use: Yes    Comment: States she does not drink anymore  . Drug use: No     Allergies   Morphine and related; Tramadol; and Penicillins   Review of Systems Review of Systems  Gastrointestinal: Positive for abdominal pain.  All other systems reviewed and are negative.    Physical Exam Updated Vital Signs BP 129/83   Pulse 93   Temp 98.8 F (37.1 C) (Oral)   Resp 16   Ht  5\' 6"  (1.676 m)   Wt 64.4 kg (142 lb)   SpO2 95%   BMI 22.92 kg/m   Physical Exam  Constitutional: She is oriented to person, place, and time. She appears well-developed and well-nourished.  HENT:  Head: Normocephalic and atraumatic.  Cardiovascular: Regular rhythm.  No murmur heard. tachycardic  Pulmonary/Chest: Effort normal and breath sounds normal. No respiratory distress.  Abdominal: Soft. There is no rebound and no guarding.  Moderate diffuse abdominal tenderness  Genitourinary:  Genitourinary Comments: Small amount of blood in vaginal vault, easily cleared with fox swab with no re-pooling.  No blood from cervical os.  Os closed. No CMT.  There with mild right adnexal tenderness  Musculoskeletal: She exhibits no edema.  Tenderness to palpation over the right hip.  Neurological: She is alert and oriented to person, place, and time.  Skin: Skin is warm and dry.  Psychiatric:  Anxious appearing.  Nursing note and vitals reviewed.    ED Treatments / Results  Labs (all labs ordered are  listed, but only abnormal results are displayed) Labs Reviewed  WET PREP, GENITAL - Abnormal; Notable for the following components:      Result Value   Trich, Wet Prep PRESENT (*)    WBC, Wet Prep HPF POC FEW (*)    All other components within normal limits  COMPREHENSIVE METABOLIC PANEL - Abnormal; Notable for the following components:   Sodium 132 (*)    Potassium 3.2 (*)    Chloride 98 (*)    CO2 18 (*)    Glucose, Bld 104 (*)    Calcium 8.6 (*)    AST 76 (*)    Anion gap 16 (*)    All other components within normal limits  CBC - Abnormal; Notable for the following components:   RBC 3.70 (*)    Hemoglobin 8.9 (*)    HCT 28.2 (*)    MCV 76.2 (*)    MCH 24.1 (*)    RDW 18.8 (*)    All other components within normal limits  URINALYSIS, ROUTINE W REFLEX MICROSCOPIC - Abnormal; Notable for the following components:   Color, Urine BROWN (*)    APPearance TURBID (*)    Glucose, UA   (*)    Value: TEST NOT REPORTED DUE TO COLOR INTERFERENCE OF URINE PIGMENT   Hgb urine dipstick   (*)    Value: TEST NOT REPORTED DUE TO COLOR INTERFERENCE OF URINE PIGMENT   Bilirubin Urine   (*)    Value: TEST NOT REPORTED DUE TO COLOR INTERFERENCE OF URINE PIGMENT   Ketones, ur   (*)    Value: TEST NOT REPORTED DUE TO COLOR INTERFERENCE OF URINE PIGMENT   Protein, ur   (*)    Value: TEST NOT REPORTED DUE TO COLOR INTERFERENCE OF URINE PIGMENT   Nitrite   (*)    Value: TEST NOT REPORTED DUE TO COLOR INTERFERENCE OF URINE PIGMENT   Leukocytes, UA   (*)    Value: TEST NOT REPORTED DUE TO COLOR INTERFERENCE OF URINE PIGMENT   All other components within normal limits  URINALYSIS, MICROSCOPIC (REFLEX) - Abnormal; Notable for the following components:   Bacteria, UA MANY (*)    Squamous Epithelial / LPF 6-30 (*)    All other components within normal limits  ETHANOL - Abnormal; Notable for the following components:   Alcohol, Ethyl (B) 131 (*)    All other components within normal limits  I-STAT  VENOUS BLOOD GAS, ED -  Abnormal; Notable for the following components:   pCO2, Ven 27.8 (*)    pO2, Ven 57.0 (*)    Bicarbonate 17.1 (*)    TCO2 18 (*)    Acid-base deficit 7.0 (*)    All other components within normal limits  LIPASE, BLOOD  PREGNANCY, URINE  MAGNESIUM  BLOOD GAS, VENOUS  RPR  HIV ANTIBODY (ROUTINE TESTING)  GC/CHLAMYDIA PROBE AMP ([place]) NOT AT [facility]    EKG  EKG Interpretation  Date/Time:  Sunday September 19 2017 21:50:36 EST Ventricular Rate:  98 PR Interval:    QRS Duration: 100 QT Interval:  392 QTC Calculation: 501 R Axis:   80 Text Interpretation:  Sinus rhythm Borderline prolonged QT interval Confirmed by Quintella Reichert [phone]) on 09/19/2017 9:53:01 PM       Radiology Ct Abdomen Pelvis W Contrast  Result Date: 09/20/2017 CLINICAL DATA:  Nausea, vomiting and abdominal pain. EXAM: CT ABDOMEN AND PELVIS WITH CONTRAST TECHNIQUE: Multidetector CT imaging of the abdomen and pelvis was performed using the standard protocol following bolus administration of intravenous contrast. CONTRAST:  144mL ISOVUE-300 IOPAMIDOL (ISOVUE-300) INJECTION 61% COMPARISON:  11/22/2016. FINDINGS: Lower chest: No basilar pulmonary nodules or pleural effusion. No apical pericardial effusion. Hepatobiliary: Normal hepatic contours and density. No visible biliary dilatation. Normal gallbladder. Pancreas: Normal parenchymal contours without ductal dilatation. No peripancreatic fluid collection. Spleen: Normal. Adrenals/Urinary Tract: --Adrenal glands: Normal. --Right kidney/ureter: No hydronephrosis, perinephric stranding or nephrolithiasis. No obstructing ureteral stones. --Left kidney/ureter: No hydronephrosis, perinephric stranding or nephrolithiasis. No obstructing ureteral stones. --Urinary bladder: Normal appearance for the degree of distention. Stomach/Bowel: --Stomach/Duodenum: No hiatal hernia or other gastric abnormality. Normal duodenal course. --Small bowel: No dilatation or  inflammation. --Colon: No focal abnormality. --Appendix: Surgically absent. Vascular/Lymphatic: Normal course and caliber of the major abdominal vessels. No abdominal or pelvic lymphadenopathy. Reproductive: Fluid in the endometrial cavity. 2.7 cm left ovarian cyst, likely physiologic. Musculoskeletal. 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 Other: None. IMPRESSION: No acute abdominal or pelvic abnormality. Electronically Signed   By: Ulyses Jarred M.D.   On: 09/20/2017 00:53    Procedures Procedures (including critical care time)  Medications Ordered in ED Medications  potassium chloride SA (K-DUR,KLOR-CON) CR tablet 40 mEq (0 mEq Oral Hold 09/20/17 0049)  lactated ringers bolus 1,000 mL (1,000 mLs Intravenous New Bag/Given 09/20/17 0051)  cefTRIAXone (ROCEPHIN) 1 g in sodium chloride 0.9 % 100 mL IVPB (not administered)  ondansetron (ZOFRAN) injection 4 mg (not administered)  ondansetron (ZOFRAN-ODT) disintegrating tablet 4 mg (4 mg Oral Given 09/19/17 1830)  ondansetron (ZOFRAN-ODT) disintegrating tablet 4 mg (4 mg Oral Given 09/19/17 1949)  fentaNYL (SUBLIMAZE) injection 100 mcg (100 mcg Intravenous Given 09/19/17 2211)  fentaNYL (SUBLIMAZE) injection 50 mcg (50 mcg Intravenous Given 09/19/17 2331)  iopamidol (ISOVUE-300) 61 % injection 100 mL (100 mLs Intravenous Contrast Given 09/20/17 0023)  LORazepam (ATIVAN) injection 1 mg (1 mg Intravenous Given 09/20/17 0048)     Initial Impression / Assessment and Plan / ED Course  I have reviewed the triage vital signs and the nursing notes.  Pertinent labs & imaging results that were available during my care of the patient were reviewed by me and considered in my medical decision making (see chart for details).     Patient with history of alcohol abuse here for evaluation of nausea, vomiting, diarrhea as well as abdominal pain.  She is anxious appearing on examination.  She has diffuse tenderness with no  peritoneal findings.  Labs  demonstrate stable anemia.  Urinalysis is difficult to interpret due to bloody contamination.  There are many bacteria present.  Hypokalemia low bicarb and elevated anion gap.  Alcohol level is elevated.  When asked about alcohol use patient states that she did have some drinks a day but has not been drinking regularly lately.  Pelvic examination does have some small blood in the vaginal vault with no evidence of ongoing bleeding.  Pelvic is not consistent with PID, tubo-ovarian abscess, torsion.  Wet prep is positive for trichomonas.  Patient appears asymptomatic.  Given alcohol in her system will not treat at this time as giving her Flagyl with alcohol in her system will worsen her vomiting.  CT scan with no acute abnormality.  Given possible UTI with persistent vomiting in the department medicine consulted for observation admission.  Final Clinical Impressions(s) / ED Diagnoses   Final diagnoses:  None    ED Discharge Orders    None       Quintella Reichert, MD 09/20/17 0117

## 2017-09-19 NOTE — ED Notes (Signed)
Alert, NAD, calm, interactive, resps e/u, speaking in clear complete sentences, no dyspnea noted, skin W&D, VSS, c/o abd pain and NV x 2 weeks, also diarrhea x months, (denies: fever). Vomited x 50 in last 24 hrs, diarrhea x10 in last 24 hrs.

## 2017-09-19 NOTE — ED Notes (Signed)
EDP into room, prior to RN assessment, see MD notes, orders received and initiated.  Alert, NAD, calm, interactive, resps e/u, speaking in clear complete sentences, no dyspnea noted, skin W&D, VSS, c/o abd pain and NV x2 weeks, (denies: sob, diarrhea, bleeding, dizziness or visual changes). Neighbor/friend at [facility].

## 2017-09-20 ENCOUNTER — Emergency Department (HOSPITAL_BASED_OUTPATIENT_CLINIC_OR_DEPARTMENT_OTHER): Payer: Self-pay

## 2017-09-20 DIAGNOSIS — N390 Urinary tract infection, site not specified: Secondary | ICD-10-CM | POA: Diagnosis present

## 2017-09-20 LAB — GC/CHLAMYDIA PROBE AMP (~~LOC~~) NOT AT ARMC
CHLAMYDIA, DNA PROBE: NEGATIVE
Neisseria Gonorrhea: NEGATIVE

## 2017-09-20 MED ORDER — LACTATED RINGERS IV BOLUS (SEPSIS)
1000.0000 mL | Freq: Once | INTRAVENOUS | Status: AC
  Administered 2017-09-20: 1000 mL via INTRAVENOUS

## 2017-09-20 MED ORDER — IOPAMIDOL (ISOVUE-300) INJECTION 61%
100.0000 mL | Freq: Once | INTRAVENOUS | Status: AC | PRN
  Administered 2017-09-20: 100 mL via INTRAVENOUS

## 2017-09-20 MED ORDER — PROMETHAZINE HCL 25 MG/ML IJ SOLN: 25.0000 mg | Freq: Once | INTRAMUSCULAR | Status: DC

## 2017-09-20 MED ORDER — LORAZEPAM 2 MG/ML IJ SOLN: 0.0000 mg | Freq: Two times a day (BID) | INTRAMUSCULAR | Status: DC

## 2017-09-20 MED ORDER — ONDANSETRON 4 MG PO TBDP: 4.0000 mg | ORAL_TABLET | Freq: Three times a day (TID) | ORAL | 0 refills | Status: DC | PRN

## 2017-09-20 MED ORDER — ONDANSETRON HCL 4 MG/2ML IJ SOLN
4.0000 mg | Freq: Once | INTRAMUSCULAR | Status: AC
  Administered 2017-09-20: 4 mg via INTRAVENOUS
  Filled 2017-09-20: qty 2

## 2017-09-20 MED ORDER — POTASSIUM CHLORIDE 10 MEQ/100ML IV SOLN
10.0000 meq | INTRAVENOUS | Status: AC
  Administered 2017-09-20 (×3): 10 meq via INTRAVENOUS
  Filled 2017-09-20 (×3): qty 100

## 2017-09-20 MED ORDER — LORAZEPAM 2 MG/ML IJ SOLN
0.0000 mg | Freq: Four times a day (QID) | INTRAMUSCULAR | Status: DC
  Administered 2017-09-20: 1 mg via INTRAVENOUS
  Administered 2017-09-20: 2 mg via INTRAVENOUS
  Filled 2017-09-20 (×2): qty 1

## 2017-09-20 MED ORDER — LORAZEPAM 2 MG/ML IJ SOLN
1.0000 mg | Freq: Once | INTRAMUSCULAR | Status: AC
  Administered 2017-09-20: 1 mg via INTRAVENOUS
  Filled 2017-09-20: qty 1

## 2017-09-20 MED ORDER — HALOPERIDOL LACTATE 5 MG/ML IJ SOLN
2.0000 mg | Freq: Once | INTRAMUSCULAR | Status: AC
  Administered 2017-09-20: 2 mg via INTRAVENOUS
  Filled 2017-09-20: qty 1

## 2017-09-20 MED ORDER — DEXTROSE-NACL 5-0.45 % IV SOLN
INTRAVENOUS | Status: DC
  Administered 2017-09-20: 03:00:00 via INTRAVENOUS

## 2017-09-20 MED ORDER — METRONIDAZOLE 0.75 % VA GEL: 1.0000 | Freq: Every day | VAGINAL | 0 refills | Status: AC

## 2017-09-20 MED ORDER — LORAZEPAM 1 MG PO TABS: 0.0000 mg | ORAL_TABLET | Freq: Two times a day (BID) | ORAL | Status: DC

## 2017-09-20 MED ORDER — CEFTRIAXONE SODIUM 1 G IJ SOLR
1.0000 g | Freq: Once | INTRAMUSCULAR | Status: AC
  Administered 2017-09-20: 1 g via INTRAVENOUS
  Filled 2017-09-20: qty 10

## 2017-09-20 MED ORDER — VITAMIN B-1 100 MG PO TABS
100.0000 mg | ORAL_TABLET | Freq: Every day | ORAL | Status: DC
  Administered 2017-09-20: 100 mg via ORAL
  Filled 2017-09-20: qty 1

## 2017-09-20 MED ORDER — LORAZEPAM 1 MG PO TABS: 0.0000 mg | ORAL_TABLET | Freq: Four times a day (QID) | ORAL | Status: DC

## 2017-09-20 MED ORDER — METRONIDAZOLE 0.75 % VA GEL: 1.0000 | Freq: Two times a day (BID) | VAGINAL | 0 refills | Status: DC

## 2017-09-20 MED ORDER — THIAMINE HCL 100 MG/ML IJ SOLN
100.0000 mg | Freq: Every day | INTRAMUSCULAR | Status: DC
  Filled 2017-09-20: qty 2

## 2017-09-20 NOTE — ED Notes (Signed)
Resting, restless, arousable to entering room, c/o nausea and HA.

## 2017-09-20 NOTE — ED Notes (Signed)
Returns from CT by stretcher, actively vomiting.

## 2017-09-20 NOTE — ED Notes (Signed)
Pt up to rest room without assistance, pt c/o pain on urination and severe nausea

## 2017-09-20 NOTE — ED Notes (Signed)
Resting/sleeping, mildly restless, NAD, calm.VSS. Friend at [facility].

## 2017-09-20 NOTE — ED Provider Notes (Signed)
Blood pressure 125/72, pulse 84, temperature 98.8 F (37.1 C), temperature source Oral, resp. rate 18, height 5\' 6"  (1.676 m), weight 64.4 kg (142 lb), SpO2 99 %.  Assuming care from Dr. Ayesha Rumpf.  In short, Kristen Roberson is a 42 y.o. female with a chief complaint of Abdominal Pain .  Refer to the original H&P for additional details.  The current plan of care is to admit the patient to medicine service for intractable nausea/vomiting.  12:20 PM Patient continues to wait for bed placement at [facility].  We have called several times and there are no beds available at this time but they are waiting for discharges and are hopeful something will open this afternoon.  Throughout the morning the patient states she is been feeling better.  She has been drinking fluids and keeping them down without vomiting.  She states she is feeling much better and she does not wish to be admitted.  I discussed that the prior provider felt it was important for her to be admitted but she states now that she is feeling better she wishes to go home, which seems reasonable.  I did discuss her trichomonas diagnosis.  Plan to discharge home with vaginal suppository of Flagyl and Zofran.   Nanda Quinton, MD    Margette Fast, MD 09/20/17 [phone]

## 2017-09-20 NOTE — Plan of Care (Signed)
Transfer from [facility] 42 year old female presented with N/V/D. UA+, wet prep positive for Trichomonas, potassium 3.2, metabolic acidosis with AG 16( thought 2/2 Alcohol).  Given Rocephin, but held on metronidazole due to alcohol level being acutely elevated. CWIA protocol   Admit to a telemetry bed at [facility].

## 2017-09-20 NOTE — ED Notes (Signed)
EDP into room 

## 2017-09-20 NOTE — Discharge Instructions (Signed)

## 2017-09-20 NOTE — ED Notes (Signed)
Pt was requesting to be discharged home, per EDP give fluid challenge and pt c/o nausea and requesting meds.

## 2017-09-21 LAB — RPR: RPR: NONREACTIVE

## 2017-09-21 LAB — HIV ANTIBODY (ROUTINE TESTING W REFLEX): HIV Screen 4th Generation wRfx: NONREACTIVE

## 2017-10-25 ENCOUNTER — Other Ambulatory Visit: Payer: Self-pay

## 2017-10-25 ENCOUNTER — Emergency Department (HOSPITAL_COMMUNITY)
Admission: EM | Admit: 2017-10-25 | Discharge: 2017-10-26 | Disposition: A | Discharge status: Other Acute Inpatient Hospital | Payer: Self-pay | Attending: Emergency Medicine | Admitting: Emergency Medicine

## 2017-10-25 DIAGNOSIS — R45851 Suicidal ideations: Secondary | ICD-10-CM | POA: Insufficient documentation

## 2017-10-25 DIAGNOSIS — F329 Major depressive disorder, single episode, unspecified: Secondary | ICD-10-CM | POA: Insufficient documentation

## 2017-10-25 DIAGNOSIS — F419 Anxiety disorder, unspecified: Secondary | ICD-10-CM | POA: Insufficient documentation

## 2017-10-25 DIAGNOSIS — F1994 Other psychoactive substance use, unspecified with psychoactive substance-induced mood disorder: Secondary | ICD-10-CM

## 2017-10-25 DIAGNOSIS — F10920 Alcohol use, unspecified with intoxication, uncomplicated: Secondary | ICD-10-CM

## 2017-10-25 DIAGNOSIS — F1914 Other psychoactive substance abuse with psychoactive substance-induced mood disorder: Secondary | ICD-10-CM | POA: Insufficient documentation

## 2017-10-25 DIAGNOSIS — F10220 Alcohol dependence with intoxication, uncomplicated: Secondary | ICD-10-CM | POA: Insufficient documentation

## 2017-10-25 DIAGNOSIS — F331 Major depressive disorder, recurrent, moderate: Secondary | ICD-10-CM | POA: Diagnosis present

## 2017-10-25 DIAGNOSIS — Z79899 Other long term (current) drug therapy: Secondary | ICD-10-CM | POA: Insufficient documentation

## 2017-10-25 LAB — URINALYSIS, ROUTINE W REFLEX MICROSCOPIC
Bilirubin Urine: NEGATIVE
GLUCOSE, UA: NEGATIVE mg/dL
Ketones, ur: NEGATIVE mg/dL
LEUKOCYTES UA: NEGATIVE
NITRITE: NEGATIVE
PH: 6 (ref 5.0–8.0)
Protein, ur: NEGATIVE mg/dL
SPECIFIC GRAVITY, URINE: 1.006 (ref 1.005–1.030)

## 2017-10-25 LAB — CBC WITH DIFFERENTIAL/PLATELET
BASOS ABS: 0 10*3/uL (ref 0.0–0.1)
BASOS PCT: 1 %
Eosinophils Absolute: 0 10*3/uL (ref 0.0–0.7)
Eosinophils Relative: 0 %
HCT: 28.6 % — ABNORMAL LOW (ref 36.0–46.0)
Hemoglobin: 8.4 g/dL — ABNORMAL LOW (ref 12.0–15.0)
Lymphocytes Relative: 24 %
Lymphs Abs: 0.8 10*3/uL (ref 0.7–4.0)
MCH: 22.4 pg — ABNORMAL LOW (ref 26.0–34.0)
MCHC: 29.4 g/dL — ABNORMAL LOW (ref 30.0–36.0)
MCV: 76.3 fL — AB (ref 78.0–100.0)
MONO ABS: 0.3 10*3/uL (ref 0.1–1.0)
Monocytes Relative: 9 %
NEUTROS ABS: 2.2 10*3/uL (ref 1.7–7.7)
NEUTROS PCT: 66 %
Platelets: 297 10*3/uL (ref 150–400)
RBC: 3.75 MIL/uL — ABNORMAL LOW (ref 3.87–5.11)
RDW: 19.7 % — AB (ref 11.5–15.5)
WBC: 3.3 10*3/uL — ABNORMAL LOW (ref 4.0–10.5)

## 2017-10-25 LAB — COMPREHENSIVE METABOLIC PANEL
ALT: 42 U/L (ref 14–54)
AST: 84 U/L — AB (ref 15–41)
Albumin: 4.2 g/dL (ref 3.5–5.0)
Alkaline Phosphatase: 127 U/L — ABNORMAL HIGH (ref 38–126)
Anion gap: 16 — ABNORMAL HIGH (ref 5–15)
BUN: 8 mg/dL (ref 6–20)
CHLORIDE: 103 mmol/L (ref 101–111)
CO2: 19 mmol/L — AB (ref 22–32)
CREATININE: 0.5 mg/dL (ref 0.44–1.00)
Calcium: 8.5 mg/dL — ABNORMAL LOW (ref 8.9–10.3)
GFR calc non Af Amer: >60 mL/min (ref >60)
Glucose, Bld: 96 mg/dL (ref 65–99)
Potassium: 3.7 mmol/L (ref 3.5–5.1)
SODIUM: 138 mmol/L (ref 135–145)
Total Bilirubin: 0.8 mg/dL (ref 0.3–1.2)
Total Protein: 7.8 g/dL (ref 6.5–8.1)

## 2017-10-25 LAB — RAPID URINE DRUG SCREEN, HOSP PERFORMED
AMPHETAMINES: NOT DETECTED
BARBITURATES: NOT DETECTED
BENZODIAZEPINES: NOT DETECTED
Cocaine: NOT DETECTED
Opiates: NOT DETECTED
TETRAHYDROCANNABINOL: NOT DETECTED

## 2017-10-25 LAB — I-STAT BETA HCG BLOOD, ED (MC, WL, AP ONLY)

## 2017-10-25 LAB — PROTIME-INR
INR: 1.01
Prothrombin Time: 13.2 seconds (ref 11.4–15.2)

## 2017-10-25 LAB — LIPASE, BLOOD: LIPASE: 23 U/L (ref 11–51)

## 2017-10-25 LAB — PREGNANCY, URINE: Preg Test, Ur: NEGATIVE

## 2017-10-25 LAB — ETHANOL: Alcohol, Ethyl (B): 295 mg/dL — ABNORMAL HIGH (ref <10)

## 2017-10-25 LAB — ACETAMINOPHEN LEVEL

## 2017-10-25 LAB — SALICYLATE LEVEL

## 2017-10-25 MED ORDER — VITAMIN B-1 100 MG PO TABS
100.0000 mg | ORAL_TABLET | Freq: Every day | ORAL | Status: DC
  Administered 2017-10-25 – 2017-10-26 (×2): 100 mg via ORAL
  Filled 2017-10-25 (×2): qty 1

## 2017-10-25 MED ORDER — LORAZEPAM 2 MG/ML IJ SOLN: 0.0000 mg | Freq: Two times a day (BID) | INTRAMUSCULAR | Status: DC

## 2017-10-25 MED ORDER — LORAZEPAM 2 MG/ML IJ SOLN: 0.0000 mg | Freq: Four times a day (QID) | INTRAMUSCULAR | Status: DC

## 2017-10-25 MED ORDER — THIAMINE HCL 100 MG/ML IJ SOLN: 100.0000 mg | Freq: Every day | INTRAMUSCULAR | Status: DC

## 2017-10-25 MED ORDER — LORAZEPAM 1 MG PO TABS
0.0000 mg | ORAL_TABLET | Freq: Four times a day (QID) | ORAL | Status: DC
  Administered 2017-10-25 – 2017-10-26 (×3): 1 mg via ORAL
  Filled 2017-10-25 (×3): qty 1

## 2017-10-25 MED ORDER — LORAZEPAM 1 MG PO TABS: 0.0000 mg | ORAL_TABLET | Freq: Two times a day (BID) | ORAL | Status: DC

## 2017-10-25 NOTE — ED Notes (Signed)
Ronalee Belts husband [phone]

## 2017-10-25 NOTE — ED Notes (Signed)
Bed: [facility] Expected date:  Expected time:  Means of arrival:  Comments: Hold for D

## 2017-10-25 NOTE — ED Notes (Signed)
Bed: [facility] Expected date:  Expected time:  Means of arrival:  Comments: Hold for room 14

## 2017-10-25 NOTE — ED Notes (Signed)
Patient currently denies SI/HI/AVH at this time. Plan of care discussed. Encouragement and support provided an safety maintain. Q 15 min safety checks in place and video monitoring.

## 2017-10-25 NOTE — BH Assessment (Signed)
[facility] Assessment Progress Note  Case was staffed with Romilda Garret FNP who recommended a inpatient admission as appropriate bed placement is investigated.
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
## 2017-10-25 NOTE — ED Triage Notes (Signed)
Per EMS-4-20 Trazodone 50 mg-states she admitted to [facility] she only took 4-patient has superficial cuts to both wrists-states she is in abdominal pain and wants pain to go away

## 2017-10-25 NOTE — ED Notes (Signed)
Bed: LH73 Expected date:  Expected time:  Means of arrival:  Comments: Hold-OD

## 2017-10-25 NOTE — ED Provider Notes (Signed)
[facility] DEPT Provider Note   CSN: [id number] Arrival date & time: 10/25/17  1321     History   Chief Complaint Chief Complaint  Patient presents with  . Drug Overdose    HPI Kristen Roberson is a 42 y.o. female.  42 year old female with history of alcohol abuse, anxiety, cardiac arrest, and encephalopathy presents with reported suicidal ideation.  Patient reports that she cut both of her wrists "several days ago" trying to "end it all." She used a kitchen knife. She admits to taking an extra dose of trazodone this morning, but denies other overdose.  Her last alcohol intake was this morning.  She appears to be moderately intoxicated at the time of my evaluation.  She reports that she is upset regarding the death of her fiancee.   She reports that her last tetanus update was within the last 5 years.   Her neighbor checked on her today and called EMS.   The history is provided by the patient.  Mental Health Problem  Presenting symptoms: self-mutilation, suicidal thoughts and suicide attempt   Degree of incapacity (severity):  Mild Onset quality:  Unable to specify Duration:  3 days Timing:  Constant Progression:  Worsening Chronicity:  New Context: alcohol use and drug abuse   Treatment compliance:  Untreated Relieved by:  Nothing Worsened by:  Alcohol Ineffective treatments:  None tried   Past Medical History:  Diagnosis Date  . Alcohol abuse   . Anemia   . Anxiety   . Blood transfusion without reported diagnosis   . Cardiac arrest ([place])   . Cysts of both ovaries   . Depression   . Fatty liver 10/05/13  . Proctitis   . Seizures [facility])     Patient Active Problem List   Diagnosis Date Noted  . UTI (urinary tract infection) 09/20/2017  . MDD (major depressive disorder) 01/16/2017  . Alcohol dependence with withdrawal, uncomplicated ([place]) 14/48/1856  . Hepatic encephalopathy ([place]) 02/23/2015  . Seizure ([place]) 02/23/2015  .  Pancytopenia ([place]) 02/23/2015  . Internal hemorrhoids 02/23/2015  . Alcohol intoxication ([place])   . Hemorrhoid   . C. difficile colitis   . Alcohol abuse   . Elevated liver enzymes   . Colitis 12/12/2014  . Abrasion of wrist   . Alcohol dependence with uncomplicated withdrawal ([place])   . GAD (generalized anxiety disorder) 07/01/2014  . Alcohol withdrawal syndrome without complication ([place]) 31/49/7026  . MDD (major depressive disorder), recurrent episode, moderate ([place])   . Alcohol use disorder, severe, dependence ([place])   . Alcohol dependence with withdrawal with complication ([place]) 37/85/8850  . Thrombocytopenia ([place]) 05/07/2014  . Hypokalemia 05/07/2014  . Right sided weakness 04/15/2014  . Seizures ([place]) 04/04/2014  . Substance induced mood disorder ([place]) 03/26/2014  . Status post myocardial infarction 03/06/2014  . Depression 03/06/2014  . Substance abuse ([place]) 02/22/2014  . Acute respiratory failure with hypoxia ([place]) 02/22/2014  . Ventricular fibrillation ([place]) 02/22/2014  . Acute systolic heart failure - s/p VF Cardiac Arrest 02/22/2014  . Acute encephalopathy 02/22/2014  . Acute confusional state 02/22/2014  . Moderate malnutrition ([place]) 02/21/2014  . Convulsions/seizures ([place]) 02/14/2014  . Cardiac arrest ([place]) 02/13/2014  . Alcohol dependence ([place]) 01/06/2014  . Severe alcohol use disorder ([place]) 01/06/2014  . Adjustment disorder with depressed mood 12/14/2013  . 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 Folate deficiency 12/11/2013  . Alcoholism ([place]) 12/11/2013  . Alcohol withdrawal ([place]) 12/11/2013  . Hypokalemia 12/11/2013  . Malnutrition  of moderate degree ([place]) 12/10/2013  . Abdominal pain 11/25/2013  . Mallory-Weiss tear 11/25/2013  . Acute blood loss anemia 11/24/2013  . Hematemesis 11/23/2013  . Fatty liver 10/29/2013  . Internal hemorrhoids with other complication 66/12/3014  . Hematochezia 10/28/2013  . Normocytic anemia 10/28/2013  . GIB  (gastrointestinal bleeding) 10/28/2013  . Anxiety   . Post-operative state 09/29/2013  . Postoperative state 09/28/2013  . Female pelvic pain 09/21/2013  . Menorrhagia 09/21/2013  . History of ovarian cyst 09/21/2013  . Proctitis 09/21/2013  . Dysmenorrhea 09/21/2013    Past Surgical History:  Procedure Laterality Date  . APPENDECTOMY    . colitis    . COLONOSCOPY N/A 09/30/2013   Procedure: COLONOSCOPY;  Surgeon: Lafayette Dragon, MD;  [facility];  Service: Endoscopy;  Laterality: N/A;  . ESOPHAGOGASTRODUODENOSCOPY N/A 11/23/2013   Procedure: ESOPHAGOGASTRODUODENOSCOPY (EGD);  Surgeon: Jerene Bears, MD;  Location: [facility];  Service: Endoscopy;  Laterality: N/A;  . LAPAROSCOPIC APPENDECTOMY Right 09/28/2013   Procedure: APPENDECTOMY LAPAROSCOPIC;  Surgeon: Terrance Mass, MD;  Location: [facility];  Service: Gynecology;  Laterality: Right;  . LAPAROSCOPY N/A 09/28/2013   Procedure: LAPAROSCOPY OPERATIVE;  Surgeon: Terrance Mass, MD;  Location: [facility];  Service: Gynecology;  Laterality: N/A;  . LEFT AND RIGHT HEART CATHETERIZATION WITH CORONARY ANGIOGRAM N/A 02/23/2014   Procedure: LEFT AND RIGHT HEART CATHETERIZATION WITH CORONARY ANGIOGRAM;  Surgeon: Leonie Man, MD;  Location: [facility] CATH LAB;  Service: Cardiovascular;  Laterality: N/A;  . OVARIAN CYST REMOVAL    . SALPINGOOPHORECTOMY Right 09/28/2013   Procedure: SALPINGO OOPHORECTOMY;  Surgeon: Terrance Mass, MD;  Location: [facility];  Service: Gynecology;  Laterality: Right;     OB History    Gravida  7   Para  3   Term      Preterm      AB  4   Living  3     SAB  4   TAB      Ectopic      Multiple      Live Births               Home Medications    Prior to Admission medications   Medication Sig Start Date End Date Taking? Authorizing Provider  FLUoxetine (PROZAC) 40 MG capsule Take 1 capsule (40 mg total) by mouth daily. For depression 01/20/17   Lindell Spar I, NP  ibuprofen (ADVIL,MOTRIN)  200 MG tablet Take 400 mg by mouth every 6 (six) hours as needed for mild pain.    [provider]  levETIRAcetam (KEPPRA) 1000 MG tablet Take 1 tablet (1,000 mg total) by mouth 2 (two) times daily. For seizure activities 01/19/17   Lindell Spar I, NP  norethindrone (AYGESTIN) 5 MG tablet Take 1 tablet (5 mg total) by mouth 3 (three) times daily. You may decrease to twice daily and then once daily as bleeding improves 07/14/17 08/13/17  Jacqlyn Larsen, PA-C  ondansetron (ZOFRAN ODT) 4 MG disintegrating tablet Take 1 tablet (4 mg total) by mouth every 8 (eight) hours as needed for nausea or vomiting. 09/20/17   Long, Wonda Olds, MD  oxyCODONE-acetaminophen (PERCOCET) 5-325 MG tablet Take 1-2 tablets by mouth every 4 (four) hours as needed. 07/14/17   Jacqlyn Larsen, PA-C    Family History Family History  Problem Relation Age of Onset  . Diabetes Mother   . Hyperlipidemia Mother   . Stroke Mother   . Diabetes Father  Social History Social History   Tobacco Use  . Smoking status: Never Smoker  . Smokeless tobacco: Never Used  Substance Use Topics  . Alcohol use: Yes    Comment: States she does not drink anymore  . Drug use: No     Allergies   Morphine and related; Tramadol; and Penicillins   Review of Systems Review of Systems  Psychiatric/Behavioral: Positive for self-injury and suicidal ideas.  All other systems reviewed and are negative.    Physical Exam Updated Vital Signs BP 115/83 (BP Location: Left Arm)   Pulse 100   Temp 98 F (36.7 C) (Oral)   Resp 20   SpO2 100%   Physical Exam  Constitutional: She is oriented to person, place, and time. She appears well-developed and well-nourished. No distress.  HENT:  Head: Normocephalic and atraumatic.  Mouth/Throat: Oropharynx is clear and moist.  Eyes: Pupils are equal, round, and reactive to light. Conjunctivae and EOM are normal.  Neck: Normal range of motion. Neck supple.  Cardiovascular: Normal rate,  regular rhythm and normal heart sounds.  Pulmonary/Chest: Effort normal and breath sounds normal. No respiratory distress.  Abdominal: Soft. She exhibits no distension. There is no tenderness.  Musculoskeletal: Normal range of motion. She exhibits no edema or deformity.  Neurological: She is alert and oriented to person, place, and time.  Skin: Skin is warm and dry.  Multiple shallow lacerations to bilateral wrists and left upper arm - no active bleeding - no indication for suture repair. Distal BUE are  NVI.   Psychiatric: She has a normal mood and affect.  Nursing note and vitals reviewed.    ED Treatments / Results  Labs (all labs ordered are listed, but only abnormal results are displayed) Labs Reviewed  ACETAMINOPHEN LEVEL - Abnormal; Notable for the following components:      Result Value   Acetaminophen (Tylenol), Serum <10 (*)    All other components within normal limits  COMPREHENSIVE METABOLIC PANEL - Abnormal; Notable for the following components:   CO2 19 (*)    Calcium 8.5 (*)    AST 84 (*)    Alkaline Phosphatase 127 (*)    Anion gap 16 (*)    All other components within normal limits  ETHANOL - Abnormal; Notable for the following components:   Alcohol, Ethyl (B) 295 (*)    All other components within normal limits  CBC WITH DIFFERENTIAL/PLATELET - Abnormal; Notable for the following components:   WBC 3.3 (*)    RBC 3.75 (*)    Hemoglobin 8.4 (*)    HCT 28.6 (*)    MCV 76.3 (*)    MCH 22.4 (*)    MCHC 29.4 (*)    RDW 19.7 (*)    All other components within normal limits  URINALYSIS, ROUTINE W REFLEX MICROSCOPIC - Abnormal; Notable for the following components:   Hgb urine dipstick LARGE (*)    Bacteria, UA RARE (*)    Squamous Epithelial / LPF 6-30 (*)    All other components within normal limits  LIPASE, BLOOD  PROTIME-INR  SALICYLATE LEVEL  PREGNANCY, URINE  RAPID URINE DRUG SCREEN, HOSP PERFORMED  I-STAT BETA HCG BLOOD, ED ([facility], [facility], [facility] ONLY)  POC  URINE PREG, ED    EKG EKG Interpretation  Date/Time:  Monday October 25 2017 13:35:51 EDT Ventricular Rate:  98 PR Interval:    QRS Duration: 91 QT Interval:  424 QTC Calculation: 542 R Axis:   65 Text Interpretation:  Sinus rhythm  Prolonged QT interval Confirmed by Dene Gentry [phone]) on 10/25/2017 2:02:54 PM   Radiology No results found.  Procedures Procedures (including critical care time)  Medications Ordered in ED Medications - No data to display   Initial Impression / Assessment and Plan / ED Course  I have reviewed the triage vital signs and the nursing notes.  Pertinent labs & imaging results that were available during my care of the patient were reviewed by me and considered in my medical decision making (see chart for details).    MDM  Screen complete  Patient is presenting intoxicated with expressed SI with plan to cut her wrists. Lacerations to wrists do not require repair. Screening labs show elevated ETOH (295), but are otherwise unremarkable. Patient is medically clear at this time for further psychiatric evaluation and treatment.   Patient placed on psych with CIWA orders. Disposition is pending on further TTS consult and evaluation.     Final Clinical Impressions(s) / ED Diagnoses   Final diagnoses:  Suicidal ideation  Alcoholic intoxication without complication [facility])    ED Discharge Orders    None       Valarie Merino, MD 10/25/17 [phone]

## 2017-10-26 ENCOUNTER — Encounter (HOSPITAL_COMMUNITY): Payer: Self-pay

## 2017-10-26 ENCOUNTER — Inpatient Hospital Stay (HOSPITAL_COMMUNITY)
Admission: AD | Admit: 2017-10-26 | Discharge: 2017-10-29 | DRG: 885 | Disposition: A | Discharge status: Home / Self Care | Payer: Federal, State, Local not specified - Other | Source: Intra-hospital | Attending: Psychiatry | Admitting: Psychiatry

## 2017-10-26 DIAGNOSIS — I252 Old myocardial infarction: Secondary | ICD-10-CM | POA: Diagnosis not present

## 2017-10-26 DIAGNOSIS — Y908 Blood alcohol level of 240 mg/100 ml or more: Secondary | ICD-10-CM

## 2017-10-26 DIAGNOSIS — Z630 Problems in relationship with spouse or partner: Secondary | ICD-10-CM

## 2017-10-26 DIAGNOSIS — Z811 Family history of alcohol abuse and dependence: Secondary | ICD-10-CM

## 2017-10-26 DIAGNOSIS — R45851 Suicidal ideations: Secondary | ICD-10-CM | POA: Diagnosis present

## 2017-10-26 DIAGNOSIS — F1020 Alcohol dependence, uncomplicated: Secondary | ICD-10-CM | POA: Diagnosis present

## 2017-10-26 DIAGNOSIS — X789XXA Intentional self-harm by unspecified sharp object, initial encounter: Secondary | ICD-10-CM | POA: Diagnosis not present

## 2017-10-26 DIAGNOSIS — Z90721 Acquired absence of ovaries, unilateral: Secondary | ICD-10-CM | POA: Diagnosis not present

## 2017-10-26 DIAGNOSIS — K760 Fatty (change of) liver, not elsewhere classified: Secondary | ICD-10-CM | POA: Diagnosis present

## 2017-10-26 DIAGNOSIS — Z634 Disappearance and death of family member: Secondary | ICD-10-CM | POA: Diagnosis not present

## 2017-10-26 DIAGNOSIS — Z823 Family history of stroke: Secondary | ICD-10-CM | POA: Diagnosis not present

## 2017-10-26 DIAGNOSIS — F332 Major depressive disorder, recurrent severe without psychotic features: Secondary | ICD-10-CM | POA: Diagnosis present

## 2017-10-26 DIAGNOSIS — F411 Generalized anxiety disorder: Secondary | ICD-10-CM | POA: Diagnosis present

## 2017-10-26 DIAGNOSIS — Z833 Family history of diabetes mellitus: Secondary | ICD-10-CM | POA: Diagnosis not present

## 2017-10-26 DIAGNOSIS — F331 Major depressive disorder, recurrent, moderate: Secondary | ICD-10-CM

## 2017-10-26 DIAGNOSIS — F419 Anxiety disorder, unspecified: Secondary | ICD-10-CM | POA: Diagnosis not present

## 2017-10-26 DIAGNOSIS — Z8719 Personal history of other diseases of the digestive system: Secondary | ICD-10-CM

## 2017-10-26 DIAGNOSIS — Z915 Personal history of self-harm: Secondary | ICD-10-CM | POA: Diagnosis not present

## 2017-10-26 DIAGNOSIS — Z8674 Personal history of sudden cardiac arrest: Secondary | ICD-10-CM | POA: Diagnosis not present

## 2017-10-26 DIAGNOSIS — G40909 Epilepsy, unspecified, not intractable, without status epilepticus: Secondary | ICD-10-CM | POA: Diagnosis present

## 2017-10-26 DIAGNOSIS — F10230 Alcohol dependence with withdrawal, uncomplicated: Secondary | ICD-10-CM | POA: Diagnosis present

## 2017-10-26 DIAGNOSIS — G4700 Insomnia, unspecified: Secondary | ICD-10-CM | POA: Diagnosis present

## 2017-10-26 DIAGNOSIS — T1491XA Suicide attempt, initial encounter: Secondary | ICD-10-CM

## 2017-10-26 DIAGNOSIS — Z8349 Family history of other endocrine, nutritional and metabolic diseases: Secondary | ICD-10-CM

## 2017-10-26 DIAGNOSIS — F1010 Alcohol abuse, uncomplicated: Secondary | ICD-10-CM

## 2017-10-26 MED ORDER — ONDANSETRON 4 MG PO TBDP
4.0000 mg | ORAL_TABLET | Freq: Four times a day (QID) | ORAL | Status: DC | PRN
  Administered 2017-10-27 – 2017-10-28 (×3): 4 mg via ORAL
  Filled 2017-10-26 (×3): qty 1

## 2017-10-26 MED ORDER — LORAZEPAM 1 MG PO TABS: 1.0000 mg | ORAL_TABLET | Freq: Two times a day (BID) | ORAL | Status: DC

## 2017-10-26 MED ORDER — LEVETIRACETAM 500 MG PO TABS
1000.0000 mg | ORAL_TABLET | Freq: Two times a day (BID) | ORAL | Status: DC
  Administered 2017-10-26: 1000 mg via ORAL
  Filled 2017-10-26: qty 2

## 2017-10-26 MED ORDER — VITAMIN B-1 100 MG PO TABS
100.0000 mg | ORAL_TABLET | Freq: Every day | ORAL | Status: DC
  Administered 2017-10-27 – 2017-10-29 (×3): 100 mg via ORAL
  Filled 2017-10-26 (×4): qty 1

## 2017-10-26 MED ORDER — FLUOXETINE HCL 20 MG PO CAPS
40.0000 mg | ORAL_CAPSULE | Freq: Every day | ORAL | Status: DC
  Administered 2017-10-26: 40 mg via ORAL
  Filled 2017-10-26: qty 2

## 2017-10-26 MED ORDER — VITAMIN B-1 100 MG PO TABS
100.0000 mg | ORAL_TABLET | Freq: Every day | ORAL | Status: DC
  Filled 2017-10-26: qty 1

## 2017-10-26 MED ORDER — LORAZEPAM 1 MG PO TABS
0.0000 mg | ORAL_TABLET | Freq: Four times a day (QID) | ORAL | Status: DC
  Administered 2017-10-26: 1 mg via ORAL
  Filled 2017-10-26: qty 1

## 2017-10-26 MED ORDER — LORAZEPAM 2 MG/ML IJ SOLN: 0.0000 mg | Freq: Four times a day (QID) | INTRAMUSCULAR | Status: DC

## 2017-10-26 MED ORDER — LOPERAMIDE HCL 2 MG PO CAPS: 2.0000 mg | ORAL_CAPSULE | ORAL | Status: DC | PRN

## 2017-10-26 MED ORDER — LORAZEPAM 2 MG/ML IJ SOLN: 0.0000 mg | Freq: Two times a day (BID) | INTRAMUSCULAR | Status: DC

## 2017-10-26 MED ORDER — LEVETIRACETAM 500 MG PO TABS
1000.0000 mg | ORAL_TABLET | Freq: Two times a day (BID) | ORAL | Status: DC
  Administered 2017-10-26 – 2017-10-29 (×6): 1000 mg via ORAL
  Filled 2017-10-26 (×9): qty 2

## 2017-10-26 MED ORDER — ALUM & MAG HYDROXIDE-SIMETH 200-200-20 MG/5ML PO SUSP: 30.0000 mL | ORAL | Status: DC | PRN

## 2017-10-26 MED ORDER — LORAZEPAM 1 MG PO TABS: 1.0000 mg | ORAL_TABLET | Freq: Three times a day (TID) | ORAL | Status: DC

## 2017-10-26 MED ORDER — LORAZEPAM 1 MG PO TABS
1.0000 mg | ORAL_TABLET | Freq: Four times a day (QID) | ORAL | Status: DC
  Administered 2017-10-27 – 2017-10-28 (×5): 1 mg via ORAL
  Filled 2017-10-26 (×5): qty 1

## 2017-10-26 MED ORDER — LORAZEPAM 1 MG PO TABS: 1.0000 mg | ORAL_TABLET | Freq: Every day | ORAL | Status: DC

## 2017-10-26 MED ORDER — MAGNESIUM HYDROXIDE 400 MG/5ML PO SUSP: 30.0000 mL | Freq: Every day | ORAL | Status: DC | PRN

## 2017-10-26 MED ORDER — HYDROXYZINE HCL 25 MG PO TABS: 25.0000 mg | ORAL_TABLET | Freq: Three times a day (TID) | ORAL | Status: DC | PRN

## 2017-10-26 MED ORDER — THIAMINE HCL 100 MG/ML IJ SOLN: 100.0000 mg | Freq: Every day | INTRAMUSCULAR | Status: DC

## 2017-10-26 MED ORDER — LORAZEPAM 1 MG PO TABS: 1.0000 mg | ORAL_TABLET | Freq: Four times a day (QID) | ORAL | Status: DC | PRN

## 2017-10-26 MED ORDER — TRAZODONE HCL 50 MG PO TABS
50.0000 mg | ORAL_TABLET | Freq: Every evening | ORAL | Status: DC | PRN
  Administered 2017-10-27 – 2017-10-28 (×2): 50 mg via ORAL
  Filled 2017-10-26: qty 7
  Filled 2017-10-26 (×3): qty 1

## 2017-10-26 MED ORDER — HYDROXYZINE HCL 25 MG PO TABS
25.0000 mg | ORAL_TABLET | Freq: Four times a day (QID) | ORAL | Status: DC | PRN
  Administered 2017-10-28 – 2017-10-29 (×2): 25 mg via ORAL
  Filled 2017-10-26: qty 1
  Filled 2017-10-26: qty 10
  Filled 2017-10-26: qty 1

## 2017-10-26 MED ORDER — ZIPRASIDONE MESYLATE 20 MG IM SOLR: 20.0000 mg | Freq: Two times a day (BID) | INTRAMUSCULAR | Status: DC | PRN

## 2017-10-26 MED ORDER — FLUOXETINE HCL 20 MG PO CAPS
40.0000 mg | ORAL_CAPSULE | Freq: Every day | ORAL | Status: DC
  Administered 2017-10-27 – 2017-10-29 (×3): 40 mg via ORAL
  Filled 2017-10-26 (×5): qty 2

## 2017-10-26 MED ORDER — LORAZEPAM 1 MG PO TABS: 1.0000 mg | ORAL_TABLET | ORAL | Status: DC | PRN

## 2017-10-26 MED ORDER — ACETAMINOPHEN 325 MG PO TABS: 650.0000 mg | ORAL_TABLET | Freq: Four times a day (QID) | ORAL | Status: DC | PRN

## 2017-10-26 MED ORDER — LORAZEPAM 1 MG PO TABS: 0.0000 mg | ORAL_TABLET | Freq: Two times a day (BID) | ORAL | Status: DC

## 2017-10-26 MED ORDER — ADULT MULTIVITAMIN W/MINERALS CH
1.0000 | ORAL_TABLET | Freq: Every day | ORAL | Status: DC
  Administered 2017-10-27 – 2017-10-29 (×3): 1 via ORAL
  Filled 2017-10-26 (×4): qty 1

## 2017-10-26 NOTE — ED Notes (Signed)
Patient has stayed in bed during this shift. Patient has not verbalized any complaints. Encouragement and support provided and safety maintain. Q 15 min safety checks remain in place.

## 2017-10-26 NOTE — Tx Team (Signed)
Initial Treatment Plan 10/26/2017 5:30 PM Kristen Roberson FMB:[id number]    PATIENT STRESSORS: Financial difficulties Loss of finance Substance abuse   PATIENT STRENGTHS: Capable of independent living General fund of knowledge   PATIENT IDENTIFIED PROBLEMS: Suicidal ideation  Substance abuse  "I need help with my substance abuse"  "I need help with my anxiety"               DISCHARGE CRITERIA:  Improved stabilization in mood, thinking, and/or behavior Verbal commitment to aftercare and medication compliance Withdrawal symptoms are absent or subacute and managed without 24-hour nursing intervention  PRELIMINARY DISCHARGE PLAN: Outpatient therapy Medication management  PATIENT/FAMILY INVOLVEMENT: This treatment plan has been presented to and reviewed with the patient, Kristen Roberson.  The patient and family have been given the opportunity to ask questions and make suggestions.  Windell Moment, RN 10/26/2017, 5:30 PM
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
## 2017-10-26 NOTE — Consult Note (Addendum)
[facility] Consult   Reason for Consult:  Suicidal ideation Referring Physician:  EDP Patient Identification: Kristen Roberson MRN:  [id number] Principal Diagnosis: MDD (major depressive disorder), recurrent episode, moderate ([place]) Diagnosis:   Patient Active Problem List   Diagnosis Date Noted  . UTI (urinary tract infection) [N39.0] 09/20/2017  . MDD (major depressive disorder) [F32.9] 01/16/2017  . Alcohol dependence with withdrawal, uncomplicated ([place]) [M57.846] 03/17/2015  . Hepatic encephalopathy ([place]) [K72.90] 02/23/2015  . Seizure ([place]) [R56.9] 02/23/2015  . Pancytopenia ([place]) [N62.952] 02/23/2015  . Internal hemorrhoids [K64.8] 02/23/2015  . Alcohol intoxication ([place]) [F10.929]   . Hemorrhoid [K64.9]   . C. difficile colitis [A04.72]   . Alcohol abuse [F10.10]   . Elevated liver enzymes [R74.8]   . Colitis [K52.9] 12/12/2014  . Abrasion of wrist [S60.819A]   . Alcohol dependence with uncomplicated withdrawal ([place]) [F10.230]   . GAD (generalized anxiety disorder) [F41.1] 07/01/2014  . Alcohol withdrawal syndrome without complication ([place]) [W41.324] 06/30/2014  . MDD (major depressive disorder), recurrent episode, moderate ([place]) [F33.1]   . Alcohol use disorder, severe, dependence ([place]) [F10.20]   . Alcohol dependence with withdrawal with complication ([place]) [M01.027] 05/19/2014  . Thrombocytopenia ([place]) [D69.6] 05/07/2014  . Hypokalemia [E87.6] 05/07/2014  . Right sided weakness [R53.1] 04/15/2014  . Seizures ([place]) [R56.9] 04/04/2014  . Substance induced mood disorder ([place]) [F19.94] 03/26/2014  . Status post myocardial infarction [I25.2] 03/06/2014  . Depression [F32.9] 03/06/2014  . Substance abuse ([place]) [F19.10] 02/22/2014  . Acute respiratory failure with hypoxia ([place]) [J96.01] 02/22/2014  . Ventricular fibrillation ([place]) [I49.01] 02/22/2014  . Acute systolic heart failure - s/p VF Cardiac Arrest [I50.21] 02/22/2014  . Acute encephalopathy [G93.40]  02/22/2014  . Acute confusional state [F05] 02/22/2014  . Moderate malnutrition ([place]) [E44.0] 02/21/2014  . Convulsions/seizures ([place]) [R56.9] 02/14/2014  . Cardiac arrest ([place]) [I46.9] 02/13/2014  . Alcohol dependence ([place]) [F10.20] 01/06/2014  . Severe alcohol use disorder ([place]) [F10.20] 01/06/2014  . Adjustment disorder with depressed mood [F43.21] 12/14/2013  . 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 Folate deficiency [E53.8] 12/11/2013  . Alcoholism ([place]) [F10.20] 12/11/2013  . Alcohol withdrawal ([place]) [F10.239] 12/11/2013  . Hypokalemia [E87.6] 12/11/2013  . Malnutrition of moderate degree ([place]) [E44.0] 12/10/2013  . Abdominal pain [R10.9] 11/25/2013  . Mallory-Weiss tear [K22.6] 11/25/2013  . Acute blood loss anemia [D62] 11/24/2013  . Hematemesis [K92.0] 11/23/2013  . Fatty liver [K76.0] 10/29/2013  . Internal hemorrhoids with other complication [O53.6] 64/40/3474  . Hematochezia [K92.1] 10/28/2013  . Normocytic anemia [D64.9] 10/28/2013  . GIB (gastrointestinal bleeding) [K92.2] 10/28/2013  . Anxiety [F41.9]   . Post-operative state [Z98.890] 09/29/2013  . Postoperative state [Z98.890] 09/28/2013  . Female pelvic pain [R10.2] 09/21/2013  . Menorrhagia [N92.0] 09/21/2013  . History of ovarian cyst [Z87.42] 09/21/2013  . 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 Dysmenorrhea [N94.6] 09/21/2013    Total Time spent with patient: 45 minutes  Subjective:   Kristen Roberson is a 42 y.o. female patient admitted with suicidal ideation and cutting her wrists while intoxicated.  HPI:  Pt was seen and chart reviewed with treatment team and Dr Mariea Clonts. Pt denies suicidal/homicidal ideation, denies auditory/visual hallucinations and does not appear to be responding to internal stimuli. Pt stated she is having a hard time dealing with the sudden passing of her fiance' and she cut her wrists and was drinking heavily. Pt stated she understands she has a problem with alcohol and  wants help. Pt is minimizing the suicide attempt  but did make cuts to both wrists with a razor. Neither wrist required sutures. Pt's BAL 295 and UDS negative. Pt would benefit from an inpatient psychiatric admission for crisis stabilization and medication management.   Past Psychiatric History: As above  Risk to Self: Suicidal Ideation: Currently denies.  Risk to Others: Homicidal Ideation: No Thoughts of Harm to Others: No Current Homicidal Intent: No Current Homicidal Plan: No Access to Homicidal Means: No Identified Victim: NA History of harm to others?: No Assessment of Violence: None Noted Violent Behavior Description: NA Does patient have access to weapons?: No Criminal Charges Pending?: No Does patient have a court date: No Prior Inpatient Therapy: Prior Inpatient Therapy: Yes Prior Therapy Dates: 2018 Prior Therapy Facilty/Provider([facility]): [facility] Reason for Treatment: MH issues Prior Outpatient Therapy: Prior Outpatient Therapy: Yes Prior Therapy Dates: 2019(Ongoing) Prior Therapy Facilty/Provider(s): [facility] Reason for Treatment: Med mang Does patient have an ACCT team?: No Does patient have Intensive In-House Services?  : No Does patient have [facility] services? : Yes Does patient have P4CC services?: No  Past Medical History:  Past Medical History:  Diagnosis Date  . Alcohol abuse   . Anemia   . Anxiety   . Blood transfusion without reported diagnosis   . Cardiac arrest ([place])   . Cysts of both ovaries   . Depression   . Fatty liver 10/05/13  . Proctitis   . Seizures ([place])     Past Surgical History:  Procedure Laterality Date  . APPENDECTOMY    . colitis    . COLONOSCOPY N/A 09/30/2013   Procedure: COLONOSCOPY;  Surgeon: Lafayette Dragon, MD;  [facility];  Service: Endoscopy;  Laterality: N/A;  . ESOPHAGOGASTRODUODENOSCOPY N/A 11/23/2013   Procedure: ESOPHAGOGASTRODUODENOSCOPY (EGD);  Surgeon: Jerene Bears, MD;  Location: [facility];  Service: Endoscopy;   Laterality: N/A;  . LAPAROSCOPIC APPENDECTOMY Right 09/28/2013   Procedure: APPENDECTOMY LAPAROSCOPIC;  Surgeon: Terrance Mass, MD;  Location: [facility];  Service: Gynecology;  Laterality: Right;  . LAPAROSCOPY N/A 09/28/2013   Procedure: LAPAROSCOPY OPERATIVE;  Surgeon: Terrance Mass, MD;  Location: [facility];  Service: Gynecology;  Laterality: N/A;  . LEFT AND RIGHT HEART CATHETERIZATION WITH CORONARY ANGIOGRAM N/A 02/23/2014   Procedure: LEFT AND RIGHT HEART CATHETERIZATION WITH CORONARY ANGIOGRAM;  Surgeon: Leonie Man, MD;  Location: [facility] CATH LAB;  Service: Cardiovascular;  Laterality: N/A;  . OVARIAN CYST REMOVAL    . SALPINGOOPHORECTOMY Right 09/28/2013   Procedure: SALPINGO OOPHORECTOMY;  Surgeon: Terrance Mass, MD;  Location: [facility];  Service: Gynecology;  Laterality: Right;   Family History:  Family History  Problem Relation Age of Onset  . Diabetes Mother   . Hyperlipidemia Mother   . Stroke Mother   . Diabetes Father    Family Psychiatric  History: Unknown Social History:  Social History   Substance and Sexual Activity  Alcohol Use Yes   Comment: States she does not drink anymore     Social History   Substance and Sexual Activity  Drug Use No    Social History   Socioeconomic History  . Marital status: Legally Separated    Spouse name: Not on file  . Number of children: Not on file  . Years of education: Not on file  . Highest education level: Not on file  Occupational History  . Not on file  Social Needs  . Financial resource strain: Not on file  . Food insecurity:    Worry: Not on file  Inability: Not on file  . Transportation needs:    Medical: Not on file    Non-medical: Not on file  Tobacco Use  . Smoking status: Never Smoker  . Smokeless tobacco: Never Used  Substance and Sexual Activity  . Alcohol use: Yes    Comment: States she does not drink anymore  . Drug use: No  . Sexual activity: Not on file  Lifestyle  . Physical activity:     Days per week: Not on file    Minutes per session: Not on file  . Stress: Not on file  Relationships  . Social connections:    Talks on phone: Not on file    Gets together: Not on file    Attends religious service: Not on file    Active member of club or organization: Not on file    Attends meetings of clubs or organizations: Not on file    Relationship status: Not on file  Other Topics Concern  . Not on file  Social History Narrative  . Not on file   Additional Social History: N/A    Allergies:   Allergies  Allergen Reactions  . Morphine And Related Anaphylaxis     Tolerated hydromorphone on 11/25/13.   . Tramadol Other (See Comments)    Seizures   . Penicillins Other (See Comments)    Unknown childhood reaction. Has patient had a PCN reaction causing immediate rash, facial/tongue/throat swelling, SOB or lightheadedness with hypotension: Unknown Has patient had a PCN reaction causing severe rash involving mucus membranes or skin necrosis: Unknown Has patient had a PCN reaction that required hospitalization unknown Has patient had a PCN reaction occurring within the last 10 years: No If all of the above answers are "NO", then may proceed with Cephalosporin use.     Labs:  Results for orders placed or performed during the hospital encounter of 10/25/17 (from the past 48 hour(s))  Acetaminophen level     Status: Abnormal   Collection Time: 10/25/17  2:29 PM  Result Value Ref Range   Acetaminophen (Tylenol), Serum <10 (L) 10 - 30 ug/mL    Comment:        THERAPEUTIC CONCENTRATIONS VARY SIGNIFICANTLY. A RANGE OF 10-30 ug/mL MAY BE AN EFFECTIVE CONCENTRATION FOR MANY PATIENTS. HOWEVER, SOME ARE BEST TREATED AT CONCENTRATIONS OUTSIDE THIS RANGE. ACETAMINOPHEN CONCENTRATIONS >150 ug/mL AT 4 HOURS AFTER INGESTION AND >50 ug/mL AT 12 HOURS AFTER INGESTION ARE OFTEN ASSOCIATED WITH TOXIC REACTIONS. Performed at [facility], [place] [address]  [place], [place] [address]   Comprehensive metabolic panel     Status: Abnormal   Collection Time: 10/25/17  2:29 PM  Result Value Ref Range   Sodium 138 135 - 145 mmol/L   Potassium 3.7 3.5 - 5.1 mmol/L   Chloride 103 101 - 111 mmol/L   CO2 19 (L) 22 - 32 mmol/L   Glucose, Bld 96 65 - 99 mg/dL   BUN 8 6 - 20 mg/dL   Creatinine, Ser 0.50 0.44 - 1.00 mg/dL   Calcium 8.5 (L) 8.9 - 10.3 mg/dL   Total Protein 7.8 6.5 - 8.1 g/dL   Albumin 4.2 3.5 - 5.0 g/dL   AST 84 (H) 15 - 41 U/L   ALT 42 14 - 54 U/L   Alkaline Phosphatase 127 (H) 38 - 126 U/L   Total Bilirubin 0.8 0.3 - 1.2 mg/dL   GFR calc non Af Amer >60 >60 mL/min   GFR calc Af Amer >60 >60  mL/min    Comment: (NOTE) The eGFR has been calculated using the CKD EPI equation. This calculation has not been validated in all clinical situations. eGFR's persistently <60 mL/min signify possible Chronic Kidney Disease.    Anion gap 16 (H) 5 - 15    Comment: Performed at [facility], [place] [address] [place], [place] [address]  Ethanol     Status: Abnormal   Collection Time: 10/25/17  2:29 PM  Result Value Ref Range   Alcohol, Ethyl (B) 295 (H) <10 mg/dL    Comment:        LOWEST DETECTABLE LIMIT FOR SERUM ALCOHOL IS 10 mg/dL FOR MEDICAL PURPOSES ONLY Performed at [facility], [place] [address] [place], [place] [address]   Lipase, blood     Status: None   Collection Time: 10/25/17  2:29 PM  Result Value Ref Range   Lipase 23 11 - 51 U/L    Comment: Performed at [facility], [place] [address], [place], [place] [address]  CBC with Differential     Status: Abnormal   Collection Time: 10/25/17  2:29 PM  Result Value Ref Range   WBC 3.3 (L) 4.0 - 10.5 K/uL   RBC 3.75 (L) 3.87 - 5.11 MIL/uL   Hemoglobin 8.4 (L) 12.0 - 15.0 g/dL   HCT 28.6 (L) 36.0 - 46.0 %   MCV 76.3 (L) 78.0 - 100.0 fL   MCH 22.4 (L) 26.0 - 34.0 pg   MCHC 29.4 (L) 30.0 - 36.0 g/dL   RDW 19.7 (H) 11.5 - 15.5 %    Platelets 297 150 - 400 K/uL   Neutrophils Relative % 66 %   Neutro Abs 2.2 1.7 - 7.7 K/uL   Lymphocytes Relative 24 %   Lymphs Abs 0.8 0.7 - 4.0 K/uL   Monocytes Relative 9 %   Monocytes Absolute 0.3 0.1 - 1.0 K/uL   Eosinophils Relative 0 %   Eosinophils Absolute 0.0 0.0 - 0.7 K/uL   Basophils Relative 1 %   Basophils Absolute 0.0 0.0 - 0.1 K/uL    Comment: Performed at [facility], [place] [address], [place], [place] [address]  Protime-INR     Status: None   Collection Time: 10/25/17  2:29 PM  Result Value Ref Range   Prothrombin Time 13.2 11.4 - 15.2 seconds   INR 1.01     Comment: Performed at [facility], [place] [address], [place], [place] [address]  Salicylate level     Status: None   Collection Time: 10/25/17  2:29 PM  Result Value Ref Range   Salicylate Lvl <3.4 2.8 - 30.0 mg/dL    Comment: Performed at [facility], [place] [address] [place], [place] [address]  I-Stat beta hCG blood, ED     Status: None   Collection Time: 10/25/17  2:33 PM  Result Value Ref Range   I-stat hCG, quantitative <5.0 <5 mIU/mL   Comment 3            Comment:   GEST. AGE      CONC.  (mIU/mL)   <=1 WEEK        5 - 50     2 WEEKS       50 - 500     3 WEEKS       100 - 10,000     4 WEEKS     1,000 - 30,000        FEMALE  AND NON-PREGNANT FEMALE:     LESS THAN 5 mIU/mL   Pregnancy, urine     Status: None   Collection Time: 10/25/17  2:51 PM  Result Value Ref Range   Preg Test, Ur NEGATIVE NEGATIVE    Comment:        THE SENSITIVITY OF THIS METHODOLOGY IS >20 mIU/mL. Performed at [facility], [place] [address], [place], [place] [address]   Urinalysis, Routine w reflex microscopic     Status: Abnormal   Collection Time: 10/25/17  2:51 PM  Result Value Ref Range   Color, Urine YELLOW YELLOW   APPearance CLEAR CLEAR   Specific Gravity, Urine 1.006 1.005 - 1.030   pH 6.0 5.0 - 8.0   Glucose, UA NEGATIVE NEGATIVE mg/dL    Hgb urine dipstick LARGE (A) NEGATIVE   Bilirubin Urine NEGATIVE NEGATIVE   Ketones, ur NEGATIVE NEGATIVE mg/dL   Protein, ur NEGATIVE NEGATIVE mg/dL   Nitrite NEGATIVE NEGATIVE   Leukocytes, UA NEGATIVE NEGATIVE   RBC / HPF 0-5 0 - 5 RBC/hpf   WBC, UA 0-5 0 - 5 WBC/hpf   Bacteria, UA RARE (A) NONE SEEN   Squamous Epithelial / LPF 6-30 (A) NONE SEEN   Mucus PRESENT    Amorphous [place] PRESENT     Comment: Performed at [facility], [place] [address], [place], [place] [address]  Urine rapid drug screen (hosp performed)     Status: None   Collection Time: 10/25/17  2:51 PM  Result Value Ref Range   Opiates NONE DETECTED NONE DETECTED   Cocaine NONE DETECTED NONE DETECTED   Benzodiazepines NONE DETECTED NONE DETECTED   Amphetamines NONE DETECTED NONE DETECTED   Tetrahydrocannabinol NONE DETECTED NONE DETECTED   Barbiturates NONE DETECTED NONE DETECTED    Comment: (NOTE) DRUG SCREEN FOR MEDICAL PURPOSES ONLY.  IF CONFIRMATION IS NEEDED FOR ANY PURPOSE, NOTIFY LAB WITHIN 5 DAYS. LOWEST DETECTABLE LIMITS FOR URINE DRUG SCREEN Drug Class                     Cutoff (ng/mL) Amphetamine and metabolites    1000 Barbiturate and metabolites    200 Benzodiazepine                 355 Tricyclics and metabolites     300 Opiates and metabolites        300 Cocaine and metabolites        300 THC                            50 Performed at [facility], [place] [address], [place], [place] [address]     Current Facility-Administered Medications  Medication Dose Route Frequency Provider Last Rate Last Dose  . FLUoxetine (PROZAC) capsule 40 mg  40 mg Oral Daily Ethelene Hal, NP   40 mg at 10/26/17 1159  . levETIRAcetam (KEPPRA) tablet 1,000 mg  1,000 mg Oral BID Ethelene Hal, NP   1,000 mg at 10/26/17 1159  . LORazepam (ATIVAN) injection 0-4 mg  0-4 mg Intravenous Q6H Valarie Merino, MD       Or  . LORazepam (ATIVAN) tablet 0-4 mg  0-4 mg  Oral Q6H Valarie Merino, MD   1 mg at 10/26/17 0640  . [START ON 10/27/2017] LORazepam (ATIVAN) injection 0-4 mg  0-4 mg Intravenous Q12H Valarie Merino, MD  Or  . [START ON 10/27/2017] LORazepam (ATIVAN) tablet 0-4 mg  0-4 mg Oral Q12H Messick, Wallis Bamberg, MD      . thiamine (VITAMIN B-1) tablet 100 mg  100 mg Oral Daily Valarie Merino, MD   100 mg at 10/26/17 1158   Or  . thiamine (B-1) injection 100 mg  100 mg Intravenous Daily Valarie Merino, MD       Current Outpatient Medications  Medication Sig Dispense Refill  . acetaminophen (TYLENOL) 500 MG tablet Take 500 mg by mouth daily as needed.    Marland Kitchen FLUoxetine (PROZAC) 40 MG capsule Take 1 capsule (40 mg total) by mouth daily. For depression 30 capsule 0  . levETIRAcetam (KEPPRA) 1000 MG tablet Take 1 tablet (1,000 mg total) by mouth 2 (two) times daily. For seizure activities 14 tablet 0  . norethindrone (AYGESTIN) 5 MG tablet Take 1 tablet (5 mg total) by mouth 3 (three) times daily. You may decrease to twice daily and then once daily as bleeding improves 90 tablet 0  . ondansetron (ZOFRAN ODT) 4 MG disintegrating tablet Take 1 tablet (4 mg total) by mouth every 8 (eight) hours as needed for nausea or vomiting. 20 tablet 0  . oxyCODONE-acetaminophen (PERCOCET) 5-325 MG tablet Take 1-2 tablets by mouth every 4 (four) hours as needed. (Patient not taking: Reported on 10/25/2017) 8 tablet 0    Musculoskeletal: Strength & Muscle Tone: within normal limits Gait & Station: normal Patient leans: N/A  Psychiatric Specialty Exam: Physical Exam  Nursing note and vitals reviewed. Constitutional: She is oriented to person, place, and time. She appears well-developed and well-nourished.  HENT:  Head: Normocephalic and atraumatic.  Neck: Normal range of motion.  Respiratory: Effort normal.  Musculoskeletal: Normal range of motion.  Neurological: She is alert and oriented to person, place, and time.  Psychiatric: Her speech is normal and  behavior is normal. Thought content normal. Cognition and memory are normal. She expresses impulsivity. She exhibits a depressed mood.    Review of Systems  Psychiatric/Behavioral: Positive for depression, substance abuse and suicidal ideas. Negative for hallucinations and memory loss. The patient is not nervous/anxious and does not have insomnia.     Blood pressure 106/80, pulse 90, temperature 98.2 F (36.8 C), temperature source Oral, resp. rate 18, height 5' 6"  (1.676 m), weight 65.8 kg (145 lb), SpO2 100 %.Body mass index is 23.4 kg/m.  General Appearance: Casual  Eye Contact:  Good  Speech:  Clear and Coherent  Volume:  Normal  Mood:  Anxious and Depressed  Affect:  Congruent and Depressed  Thought Process:  Coherent and Linear  Orientation:  Full (Time, Place, and Person)  Thought Content:  Logical  Suicidal Thoughts:  No. Currently denies.  Homicidal Thoughts:  No  Memory:  Immediate;   Good Recent;   Fair Remote;   Fair  Judgement:  Fair  Insight:  Lacking  Psychomotor Activity:  Normal  Concentration:  Concentration: Good and Attention Span: Good  Recall:  Good  Fund of Knowledge:  Good  Language:  Good  Akathisia:  No  Handed:  Right  AIMS (if indicated):   N/A  Assets:  Agricultural consultant Housing Social Support  ADL's:  Intact  Cognition:  WNL  Sleep:   N/A     Treatment Plan Summary: Daily contact with patient to assess and evaluate symptoms and progress in treatment and Medication management  (see MAR) Disposition: Recommend psychiatric Inpatient admission when medically cleared.  Margarita Grizzle  Nicky Okonski, NP 10/26/2017 1:43 PM   Patient seen face-to-face for psychiatric evaluation, chart reviewed and case discussed with the physician extender and developed treatment plan. Reviewed the information documented and agree with the treatment plan.  Buford Dresser, DO 10/26/17 9:39 PM

## 2017-10-26 NOTE — Progress Notes (Signed)
Kristen Roberson is a 42 year old female being admitted voluntarily to [facility] from [facility].  She came to the ED suicidal ideation and cutting her wrists two days ago.  She has history of alcohol abuse.  She has history of multiple psychiatric hospitalizations.  She feels like her medications have not been working.  During [facility] admission, she denies SI/HI or A/V hallucinations.  She stated that her stressor was dealing with the death of her fiance a few months ago.  Oriented her to the unit.  Admission paperwork completed and signed.  Belongings searched and no locker needed on admission, no contraband found.  Skin assessment completed and noted superficial cuts to both wrists and upper left arm, bruise inner/upper right arm and bruise back of left leg.  Q 15 minute checks initiated for safety.  We will continue to monitor the progress towards her goals.

## 2017-10-26 NOTE — BH Assessment (Signed)
[facility] Assessment Progress Note  Per Buford Dresser, DO, this pt requires psychiatric hospitalization at this time.  Leonia Reader, RN, [facility] has assigned pt to [facility] Rm 302-2; [place] will be ready to receive pt at 14:30.  Pt has signed Voluntary Admission and Consent for Treatment, as well as Consent to Release Information to [facility] and to pt's neighbor, and a notification call has been placed to the provider.  Signed forms have been faxed to [facility].  Pt's nurse, Edd Arbour, has been notified, and agrees to send original paperwork along with pt via [place] [place], and to call report to [phone].  Jalene Mullet, [place] Coordinator [phone]

## 2017-10-27 DIAGNOSIS — Z634 Disappearance and death of family member: Secondary | ICD-10-CM

## 2017-10-27 DIAGNOSIS — X789XXA Intentional self-harm by unspecified sharp object, initial encounter: Secondary | ICD-10-CM

## 2017-10-27 DIAGNOSIS — F332 Major depressive disorder, recurrent severe without psychotic features: Principal | ICD-10-CM

## 2017-10-27 DIAGNOSIS — F419 Anxiety disorder, unspecified: Secondary | ICD-10-CM

## 2017-10-27 DIAGNOSIS — R45851 Suicidal ideations: Secondary | ICD-10-CM

## 2017-10-27 DIAGNOSIS — F1010 Alcohol abuse, uncomplicated: Secondary | ICD-10-CM

## 2017-10-27 NOTE — Tx Team (Signed)
Interdisciplinary Treatment and Diagnostic Plan Update  10/27/2017 Time of Session: 10:00a Kristen Roberson MRN: [id number]  Principal Diagnosis: <principal problem not specified>  Secondary Diagnoses: Active Problems:   MDD (major depressive disorder), recurrent severe, without psychosis (Kristen Roberson)   Current Medications:  Current Facility-Administered Medications  Medication Dose Route Frequency Provider Last Rate Last Dose  . acetaminophen (TYLENOL) tablet 650 mg  650 mg Oral Q6H PRN Ethelene Hal, NP      . alum & mag hydroxide-simeth (MAALOX/MYLANTA) 200-200-20 MG/5ML suspension 30 mL  30 mL Oral Q4H PRN Ethelene Hal, NP      . FLUoxetine (PROZAC) capsule 40 mg  40 mg Oral Daily Ethelene Hal, NP   40 mg at 10/27/17 0757  . hydrOXYzine (ATARAX/VISTARIL) tablet 25 mg  25 mg Oral Q6H PRN Laverle Hobby, PA-C      . levETIRAcetam (KEPPRA) tablet 1,000 mg  1,000 mg Oral BID Ethelene Hal, NP   1,000 mg at 10/27/17 0813  . loperamide (IMODIUM) capsule 2-4 mg  2-4 mg Oral PRN Laverle Hobby, PA-C      . LORazepam (ATIVAN) tablet 1 mg  1 mg Oral Q6H PRN Laverle Hobby, PA-C      . LORazepam (ATIVAN) tablet 1 mg  1 mg Oral QID Patriciaann Clan E, PA-C   1 mg at 10/27/17 1139   Followed by  . [START ON 10/28/2017] LORazepam (ATIVAN) tablet 1 mg  1 mg Oral TID Laverle Hobby, PA-C       Followed by  . [START ON 10/29/2017] LORazepam (ATIVAN) tablet 1 mg  1 mg Oral BID Patriciaann Clan E, PA-C       Followed by  . [START ON 10/31/2017] LORazepam (ATIVAN) tablet 1 mg  1 mg Oral Daily Simon, Spencer E, PA-C      . magnesium hydroxide (MILK OF MAGNESIA) suspension 30 mL  30 mL Oral Daily PRN Ethelene Hal, NP      . multivitamin with minerals tablet 1 tablet  1 tablet Oral Daily Laverle Hobby, PA-C   1 tablet at 10/27/17 0758  . ondansetron (ZOFRAN-ODT) disintegrating tablet 4 mg  4 mg Oral Q6H PRN Laverle Hobby, PA-C   4 mg at 10/27/17 4098  . thiamine  (VITAMIN B-1) tablet 100 mg  100 mg Oral Daily Patriciaann Clan E, PA-C   100 mg at 10/27/17 0758  . traZODone (DESYREL) tablet 50 mg  50 mg Oral QHS PRN Ethelene Hal, NP      . ziprasidone (GEODON) injection 20 mg  20 mg Intramuscular Q12H PRN Ethelene Hal, NP       PTA Medications: Medications Prior to Admission  Medication Sig Dispense Refill Last Dose  . acetaminophen (TYLENOL) 500 MG tablet Take 500 mg by mouth daily as needed.   10/25/2017 at Unknown time  . FLUoxetine (PROZAC) 40 MG capsule Take 1 capsule (40 mg total) by mouth daily. For depression 30 capsule 0 10/25/2017 at Unknown time  . levETIRAcetam (KEPPRA) 1000 MG tablet Take 1 tablet (1,000 mg total) by mouth 2 (two) times daily. For seizure activities 14 tablet 0 10/25/2017 at Unknown time  . norethindrone (AYGESTIN) 5 MG tablet Take 1 tablet (5 mg total) by mouth 3 (three) times daily. You may decrease to twice daily and then once daily as bleeding improves 90 tablet 0 Past Month at Unknown time  . ondansetron (ZOFRAN ODT) 4 MG disintegrating tablet Take 1 tablet (4  mg total) by mouth every 8 (eight) hours as needed for nausea or vomiting. 20 tablet 0 Past Month at Unknown time  . oxyCODONE-acetaminophen (PERCOCET) 5-325 MG tablet Take 1-2 tablets by mouth every 4 (four) hours as needed. (Patient not taking: Reported on 10/25/2017) 8 tablet 0 Not Taking at Unknown time    Patient Stressors: Financial difficulties Loss of finance Substance abuse  Patient Strengths: Capable of independent living General fund of knowledge  Treatment Modalities: Medication Management, Group therapy, Case management,  1 to 1 session with clinician, Psychoeducation, Recreational therapy.   Physician Treatment Plan for Primary Diagnosis: <principal problem not specified> Long Term Goal(s): Improvement in symptoms so as ready for discharge Improvement in symptoms so as ready for discharge   Short Term Goals: Ability to identify changes  in lifestyle to reduce recurrence of condition will improve Ability to verbalize feelings will improve Ability to disclose and discuss suicidal ideas Ability to demonstrate self-control will improve Ability to identify and develop effective coping behaviors will improve Ability to maintain clinical measurements within normal limits will improve Compliance with prescribed medications will improve Ability to identify triggers associated with substance abuse/mental health issues will improve Ability to identify changes in lifestyle to reduce recurrence of condition will improve Ability to verbalize feelings will improve Ability to disclose and discuss suicidal ideas Ability to demonstrate self-control will improve Ability to identify and develop effective coping behaviors will improve Ability to maintain clinical measurements within normal limits will improve Compliance with prescribed medications will improve Ability to identify triggers associated with substance abuse/mental health issues will improve  Medication Management: Evaluate patient's response, side effects, and tolerance of medication regimen.  Therapeutic Interventions: 1 to 1 sessions, Unit Group sessions and Medication administration.  Evaluation of Outcomes: Not Met  Physician Treatment Plan for Secondary Diagnosis: Active Problems:   MDD (major depressive disorder), recurrent severe, without psychosis (Kristen Roberson)  Long Term Goal(s): Improvement in symptoms so as ready for discharge Improvement in symptoms so as ready for discharge   Short Term Goals: Ability to identify changes in lifestyle to reduce recurrence of condition will improve Ability to verbalize feelings will improve Ability to disclose and discuss suicidal ideas Ability to demonstrate self-control will improve Ability to identify and develop effective coping behaviors will improve Ability to maintain clinical measurements within normal limits will  improve Compliance with prescribed medications will improve Ability to identify triggers associated with substance abuse/mental health issues will improve Ability to identify changes in lifestyle to reduce recurrence of condition will improve Ability to verbalize feelings will improve Ability to disclose and discuss suicidal ideas Ability to demonstrate self-control will improve Ability to identify and develop effective coping behaviors will improve Ability to maintain clinical measurements within normal limits will improve Compliance with prescribed medications will improve Ability to identify triggers associated with substance abuse/mental health issues will improve     Medication Management: Evaluate patient's response, side effects, and tolerance of medication regimen.  Therapeutic Interventions: 1 to 1 sessions, Unit Group sessions and Medication administration.  Evaluation of Outcomes: Not Met   RN Treatment Plan for Primary Diagnosis: <principal problem not specified> Long Term Goal(s): Knowledge of disease and therapeutic regimen to maintain health will improve  Short Term Goals: Ability to remain free from injury will improve, Ability to verbalize frustration and anger appropriately will improve, Ability to demonstrate self-control, Ability to participate in decision making will improve, Ability to verbalize feelings will improve, Ability to disclose and discuss suicidal ideas, Ability to  identify and develop effective coping behaviors will improve and Compliance with prescribed medications will improve  Medication Management: RN will administer medications as ordered by provider, will assess and evaluate patient's response and provide education to patient for prescribed medication. RN will report any adverse and/or side effects to prescribing provider.  Therapeutic Interventions: 1 on 1 counseling sessions, Psychoeducation, Medication administration, Evaluate responses to  treatment, Monitor vital signs and CBGs as ordered, Perform/monitor CIWA, COWS, AIMS and Fall Risk screenings as ordered, Perform wound care treatments as ordered.  Evaluation of Outcomes: Not Met   LCSW Treatment Plan for Primary Diagnosis: <principal problem not specified> Long Term Goal(s): Safe transition to appropriate next level of care at discharge, Engage patient in therapeutic group addressing interpersonal concerns.  Short Term Goals: Engage patient in aftercare planning with referrals and resources, Increase social support, Increase ability to appropriately verbalize feelings, Increase emotional regulation, Facilitate acceptance of mental health diagnosis and concerns, Facilitate patient progression through stages of change regarding substance use diagnoses and concerns, Identify triggers associated with mental health/substance abuse issues and Increase skills for wellness and recovery  Therapeutic Interventions: Assess for all discharge needs, 1 to 1 time with Social worker, Explore available resources and support systems, Assess for adequacy in community support network, Educate family and significant other(s) on suicide prevention, Complete Psychosocial Assessment, Interpersonal group therapy.  Evaluation of Outcomes: Not Met   Progress in Treatment: Attending groups: No. New to unit Participating in groups: No. Taking medication as prescribed: Yes. Toleration medication: Yes. Family/Significant other contact made: No, will contact:  the patient's neighbor Patient understands diagnosis: Yes. Discussing patient identified problems/goals with staff: Yes. Medical problems stabilized or resolved: Yes. Denies suicidal/homicidal ideation: Yes. Issues/concerns per patient self-inventory: No. Other:   New problem(s) identified: None   New Short Term/Long Term Goal(s):Detox, medication stabilization, elimination of SI thoughts, development of comprehensive mental wellness plan.    Patient Goals: " to get myself better and go to an [place]"  Discharge Plan or Barriers: Patient plans to discharge home and continue to follow up with [facility] for medication management and therapy services. She also plans to explore options for [facility] in the area.   Reason for Continuation of Hospitalization: Anxiety Depression Medication stabilization  Estimated Length of Stay: Friday, 10/29/17  Attendees: Patient: Kristen Roberson 10/27/2017 12:48 PM  Physician: Dr. Mallie Darting, MD 10/27/2017 12:48 PM  Nursing: Darrol Angel, RN 10/27/2017 12:48 PM  RN Care Manager: Rhunette Croft 10/27/2017 12:48 PM  Social Worker: Radonna Ricker, [place] 10/27/2017 12:48 PM  Recreational Therapist: Rhunette Croft 10/27/2017 12:48 PM  Other: X 10/27/2017 12:48 PM  Other: X 10/27/2017 12:48 PM  Other:X 10/27/2017 12:48 PM    Scribe for Treatment Team: Marylee Floras, [place] 10/27/2017 12:48 PM

## 2017-10-27 NOTE — Progress Notes (Signed)
Pt has been in the bed asleep since the beginning of the shift.  Writer went to the room to talk with her, but she did not awaken to her name spoken.  No distress observed.  Respirations even and unlabored.  Safety maintained with q15 minute checks.

## 2017-10-27 NOTE — Plan of Care (Signed)
  Problem: Education: Goal: Mental status will improve Outcome: Progressing   Problem: Safety: Goal: Ability to remain free from injury will improve Outcome: Progressing

## 2017-10-27 NOTE — H&P (Signed)
Psychiatric Admission Assessment Adult  Patient Identification: Kristen Roberson MRN:  [id number] Date of Evaluation:  10/27/2017 Chief Complaint:  MDD ALCOHOL ABUSE Principal Diagnosis: <principal problem not specified> Diagnosis:   Patient Active Problem List   Diagnosis Date Noted  . MDD (major depressive disorder), recurrent severe, without psychosis ([place]) [F33.2] 10/26/2017  . UTI (urinary tract infection) [N39.0] 09/20/2017  . MDD (major depressive disorder) [F32.9] 01/16/2017  . Alcohol dependence with withdrawal, uncomplicated ([place]) [K44.010] 03/17/2015  . Hepatic encephalopathy ([place]) [K72.90] 02/23/2015  . Seizure ([place]) [R56.9] 02/23/2015  . Pancytopenia ([place]) [U72.536] 02/23/2015  . Internal hemorrhoids [K64.8] 02/23/2015  . Alcohol intoxication ([place]) [F10.929]   . Hemorrhoid [K64.9]   . C. difficile colitis [A04.72]   . Alcohol abuse [F10.10]   . Elevated liver enzymes [R74.8]   . Colitis [K52.9] 12/12/2014  . Abrasion of wrist [S60.819A]   . Alcohol dependence with uncomplicated withdrawal ([place]) [F10.230]   . GAD (generalized anxiety disorder) [F41.1] 07/01/2014  . Alcohol withdrawal syndrome without complication ([place]) [U44.034] 06/30/2014  . MDD (major depressive disorder), recurrent episode, moderate ([place]) [F33.1]   . Alcohol use disorder, severe, dependence ([place]) [F10.20]   . Alcohol dependence with withdrawal with complication ([place]) [V42.595] 05/19/2014  . Thrombocytopenia ([place]) [D69.6] 05/07/2014  . Hypokalemia [E87.6] 05/07/2014  . Right sided weakness [R53.1] 04/15/2014  . Seizures ([place]) [R56.9] 04/04/2014  . Substance induced mood disorder ([place]) [F19.94] 03/26/2014  . Status post myocardial infarction [I25.2] 03/06/2014  . Depression [F32.9] 03/06/2014  . Substance abuse ([place]) [F19.10] 02/22/2014  . Acute respiratory failure with hypoxia ([place]) [J96.01] 02/22/2014  . Ventricular fibrillation ([place]) [I49.01] 02/22/2014  . Acute systolic heart failure - s/p VF  Cardiac Arrest [I50.21] 02/22/2014  . Acute encephalopathy [G93.40] 02/22/2014  . Acute confusional state [F05] 02/22/2014  . Moderate malnutrition ([place]) [E44.0] 02/21/2014  . Convulsions/seizures ([place]) [R56.9] 02/14/2014  . Cardiac arrest ([place]) [I46.9] 02/13/2014  . Alcohol dependence ([place]) [F10.20] 01/06/2014  . Severe alcohol use disorder ([place]) [F10.20] 01/06/2014  . Adjustment disorder with depressed mood [F43.21] 12/14/2013  . 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 Folate deficiency [E53.8] 12/11/2013  . Alcoholism ([place]) [F10.20] 12/11/2013  . Alcohol withdrawal ([place]) [F10.239] 12/11/2013  . Hypokalemia [E87.6] 12/11/2013  . Malnutrition of moderate degree ([place]) [E44.0] 12/10/2013  . Abdominal pain [R10.9] 11/25/2013  . Mallory-Weiss tear [K22.6] 11/25/2013  . Acute blood loss anemia [D62] 11/24/2013  . Hematemesis [K92.0] 11/23/2013  . Fatty liver [K76.0] 10/29/2013  . Internal hemorrhoids with other complication [G38.7] 56/43/3295  . Hematochezia [K92.1] 10/28/2013  . Normocytic anemia [D64.9] 10/28/2013  . GIB (gastrointestinal bleeding) [K92.2] 10/28/2013  . Anxiety [F41.9]   . Post-operative state [Z98.890] 09/29/2013  . Postoperative state [Z98.890] 09/28/2013  . Female pelvic pain [R10.2] 09/21/2013  . Menorrhagia [N92.0] 09/21/2013  . History of ovarian cyst [Z87.42] 09/21/2013  . 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 Dysmenorrhea [N94.6] 09/21/2013   History of Present Illness: Patient is seen and examined.  Patient was seen by treatment team today including nursing as well as social work.  Patient is a 42 year old female with a past psychiatric history significant for major depression as well as alcohol use disorder.  She presented to the [facility] emergency department yesterday with suicidal ideation.  She stated that she has been more depressed, and her alcohol use had increased since the death of her fianc in Jun 01, 2017.  He had died  of cardiac reasons unexpectedly.  She had been drinking previous to  that, but after his death things became much worse.  Prior to coming to the emergency room she had attempted to cut her wrist bilaterally.  She then presented to the emergency room.  It was 295.  Her drug screen was negative.  She had one previous psychiatric admission in June 2018 under similar circumstances.  She has a history of a seizure disorder that is apparently not related to her alcohol use disorder.  She admitted to helplessness, hopelessness and worthlessness.  She admitted crying spells.  She was admitted to the hospital for evaluation and stabilization. Associated Signs/Symptoms: Depression Symptoms:  depressed mood, anhedonia, psychomotor retardation, fatigue, feelings of worthlessness/guilt, difficulty concentrating, hopelessness, suicidal thoughts without plan, anxiety, loss of energy/fatigue, disturbed sleep, (Hypo) Manic Symptoms:  Impulsivity, Anxiety Symptoms:  Excessive Worry, Psychotic Symptoms:  Denied  PTSD Symptoms: Negative Total Time spent with patient: 1 hour  Past Psychiatric History: Patient has 1 previous psychiatric admission in June 2018.  This was alcohol and depression related.  Is the patient at risk to self? Yes.    Has the patient been a risk to self in the past 6 months? Yes.    Has the patient been a risk to self within the distant past? No.  Is the patient a risk to others? No.  Has the patient been a risk to others in the past 6 months? No.  Has the patient been a risk to others within the distant past? No.   Prior Inpatient Therapy:  One previous psychiatric admission at [facility] in 12/2016 Prior Outpatient Therapy:  She is followed at [facility] as an outpatient.  Alcohol Screening: 1. How often do you have a drink containing alcohol?: 2 to 3 times a week 2. How many drinks containing alcohol do you have on a typical day when you are drinking?: 5 or 6 3. How often do you  have six or more drinks on one occasion?: Never AUDIT-C Score: 5 4. How often during the last year have you found that you were not able to stop drinking once you had started?: Never 5. How often during the last year have you failed to do what was normally expected from you becasue of drinking?: Never 6. How often during the last year have you needed a first drink in the morning to get yourself going after a heavy drinking session?: Never 7. How often during the last year have you had a feeling of guilt of remorse after drinking?: Never 8. How often during the last year have you been unable to remember what happened the night before because you had been drinking?: Never 9. Have you or someone else been injured as a result of your drinking?: No 10. Has a relative or friend or a doctor or another health worker been concerned about your drinking or suggested you cut down?: Yes, during the last year Alcohol Use Disorder Identification Test Final Score (AUDIT): 9 Intervention/Follow-up: Alcohol Education Substance Abuse History in the last 12 months:  Yes.   Consequences of Substance Abuse: NA Previous Psychotropic Medications: Yes  Psychological Evaluations: Yes  Past Medical History:  Past Medical History:  Diagnosis Date  . Alcohol abuse   . Anemia   . Anxiety   . Blood transfusion without reported diagnosis   . Cardiac arrest ([place])   . Cysts of both ovaries   . Depression   . Fatty liver 10/05/13  . Proctitis   . Seizures ([place])     Past Surgical History:  Procedure  Laterality Date  . APPENDECTOMY    . colitis    . COLONOSCOPY N/A 09/30/2013   Procedure: COLONOSCOPY;  Surgeon: Lafayette Dragon, MD;  Location: [facility];  Service: Endoscopy;  Laterality: N/A;  . ESOPHAGOGASTRODUODENOSCOPY N/A 11/23/2013   Procedure: ESOPHAGOGASTRODUODENOSCOPY (EGD);  Surgeon: Jerene Bears, MD;  Location: [facility];  Service: Endoscopy;  Laterality: N/A;  . LAPAROSCOPIC APPENDECTOMY Right 09/28/2013    Procedure: APPENDECTOMY LAPAROSCOPIC;  Surgeon: Terrance Mass, MD;  Location: [facility];  Service: Gynecology;  Laterality: Right;  . LAPAROSCOPY N/A 09/28/2013   Procedure: LAPAROSCOPY OPERATIVE;  Surgeon: Terrance Mass, MD;  Location: [facility];  Service: Gynecology;  Laterality: N/A;  . LEFT AND RIGHT HEART CATHETERIZATION WITH CORONARY ANGIOGRAM N/A 02/23/2014   Procedure: LEFT AND RIGHT HEART CATHETERIZATION WITH CORONARY ANGIOGRAM;  Surgeon: Leonie Man, MD;  Location: [facility] LAB;  Service: Cardiovascular;  Laterality: N/A;  . OVARIAN CYST REMOVAL    . SALPINGOOPHORECTOMY Right 09/28/2013   Procedure: SALPINGO OOPHORECTOMY;  Surgeon: Terrance Mass, MD;  Location: [facility];  Service: Gynecology;  Laterality: Right;   Family History:  Family History  Problem Relation Age of Onset  . Diabetes Mother   . Hyperlipidemia Mother   . Stroke Mother   . Diabetes Father    Family Psychiatric  History: Denied any formal psychiatric history, alcohol use disorder on both sides of mother and father's family Tobacco Screening: Have you used any form of tobacco in the last 30 days? (Cigarettes, Smokeless Tobacco, Cigars, and/or Pipes): No Social History:  Social History   Substance and Sexual Activity  Alcohol Use Yes   Comment: States she does not drink anymore     Social History   Substance and Sexual Activity  Drug Use No    Additional Social History:                           Allergies:   Allergies  Allergen Reactions  . Morphine And Related Anaphylaxis     Tolerated hydromorphone on 11/25/13.   . Tramadol Other (See Comments)    Seizures   . Penicillins Other (See Comments)    Unknown childhood reaction. Has patient had a PCN reaction causing immediate rash, facial/tongue/throat swelling, SOB or lightheadedness with hypotension: Unknown Has patient had a PCN reaction causing severe rash involving mucus membranes or skin necrosis: Unknown Has patient had a PCN  reaction that required hospitalization unknown Has patient had a PCN reaction occurring within the last 10 years: No If all of the above answers are "NO", then may proceed with Cephalosporin use.    Lab Results:  Results for orders placed or performed during the hospital encounter of 10/25/17 (from the past 48 hour(s))  Acetaminophen level     Status: Abnormal   Collection Time: 10/25/17  2:29 PM  Result Value Ref Range   Acetaminophen (Tylenol), Serum <10 (L) 10 - 30 ug/mL    Comment:        THERAPEUTIC CONCENTRATIONS VARY SIGNIFICANTLY. A RANGE OF 10-30 ug/mL MAY BE AN EFFECTIVE CONCENTRATION FOR MANY PATIENTS. HOWEVER, SOME ARE BEST TREATED AT CONCENTRATIONS OUTSIDE THIS RANGE. ACETAMINOPHEN CONCENTRATIONS >150 ug/mL AT 4 HOURS AFTER INGESTION AND >50 ug/mL AT 12 HOURS AFTER INGESTION ARE OFTEN ASSOCIATED WITH TOXIC REACTIONS. Performed at [facility], [place] [address], [place], [place] [address]   Comprehensive metabolic panel     Status: Abnormal   Collection Time: 10/25/17  2:29 PM  Result Value Ref Range   Sodium 138 135 - 145 mmol/L   Potassium 3.7 3.5 - 5.1 mmol/L   Chloride 103 101 - 111 mmol/L   CO2 19 (L) 22 - 32 mmol/L   Glucose, Bld 96 65 - 99 mg/dL   BUN 8 6 - 20 mg/dL   Creatinine, Ser 0.50 0.44 - 1.00 mg/dL   Calcium 8.5 (L) 8.9 - 10.3 mg/dL   Total Protein 7.8 6.5 - 8.1 g/dL   Albumin 4.2 3.5 - 5.0 g/dL   AST 84 (H) 15 - 41 U/L   ALT 42 14 - 54 U/L   Alkaline Phosphatase 127 (H) 38 - 126 U/L   Total Bilirubin 0.8 0.3 - 1.2 mg/dL   GFR calc non Af Amer >60 >60 mL/min   GFR calc Af Amer >60 >60 mL/min    Comment: (NOTE) The eGFR has been calculated using the CKD EPI equation. This calculation has not been validated in all clinical situations. eGFR's persistently <60 mL/min signify possible Chronic Kidney Disease.    Anion gap 16 (H) 5 - 15    Comment: Performed at [facility], [place] [address], [place],  [place] [address]  Ethanol     Status: Abnormal   Collection Time: 10/25/17  2:29 PM  Result Value Ref Range   Alcohol, Ethyl (B) 295 (H) <10 mg/dL    Comment:        LOWEST DETECTABLE LIMIT FOR SERUM ALCOHOL IS 10 mg/dL FOR MEDICAL PURPOSES ONLY Performed at [facility], [place] [address] [place], [place] [address]   Lipase, blood     Status: None   Collection Time: 10/25/17  2:29 PM  Result Value Ref Range   Lipase 23 11 - 51 U/L    Comment: Performed at [facility], [place] [address] [place], [place] [address]  CBC with Differential     Status: Abnormal   Collection Time: 10/25/17  2:29 PM  Result Value Ref Range   WBC 3.3 (L) 4.0 - 10.5 K/uL   RBC 3.75 (L) 3.87 - 5.11 MIL/uL   Hemoglobin 8.4 (L) 12.0 - 15.0 g/dL   HCT 28.6 (L) 36.0 - 46.0 %   MCV 76.3 (L) 78.0 - 100.0 fL   MCH 22.4 (L) 26.0 - 34.0 pg   MCHC 29.4 (L) 30.0 - 36.0 g/dL   RDW 19.7 (H) 11.5 - 15.5 %   Platelets 297 150 - 400 K/uL   Neutrophils Relative % 66 %   Neutro Abs 2.2 1.7 - 7.7 K/uL   Lymphocytes Relative 24 %   Lymphs Abs 0.8 0.7 - 4.0 K/uL   Monocytes Relative 9 %   Monocytes Absolute 0.3 0.1 - 1.0 K/uL   Eosinophils Relative 0 %   Eosinophils Absolute 0.0 0.0 - 0.7 K/uL   Basophils Relative 1 %   Basophils Absolute 0.0 0.0 - 0.1 K/uL    Comment: Performed at [facility], [place] [address] [place], [place] [address]  Protime-INR     Status: None   Collection Time: 10/25/17  2:29 PM  Result Value Ref Range   Prothrombin Time 13.2 11.4 - 15.2 seconds   INR 1.01     Comment: Performed at [facility], [place] [address], [place], [place] [address]  Salicylate level     Status: None   Collection Time: 10/25/17  2:29 PM  Result Value Ref Range   Salicylate Lvl <0.6 2.8 -  30.0 mg/dL    Comment: Performed at [facility], [place] [address] [place], [place] [address]  I-Stat beta hCG blood, ED     Status: None   Collection  Time: 10/25/17  2:33 PM  Result Value Ref Range   I-stat hCG, quantitative <5.0 <5 mIU/mL   Comment 3            Comment:   GEST. AGE      CONC.  (mIU/mL)   <=1 WEEK        5 - 50     2 WEEKS       50 - 500     3 WEEKS       100 - 10,000     4 WEEKS     1,000 - 30,000        FEMALE AND NON-PREGNANT FEMALE:     LESS THAN 5 mIU/mL   Pregnancy, urine     Status: None   Collection Time: 10/25/17  2:51 PM  Result Value Ref Range   Preg Test, Ur NEGATIVE NEGATIVE    Comment:        THE SENSITIVITY OF THIS METHODOLOGY IS >20 mIU/mL. Performed at [facility], [place] [address], [place], [place] [address]   Urinalysis, Routine w reflex microscopic     Status: Abnormal   Collection Time: 10/25/17  2:51 PM  Result Value Ref Range   Color, Urine YELLOW YELLOW   APPearance CLEAR CLEAR   Specific Gravity, Urine 1.006 1.005 - 1.030   pH 6.0 5.0 - 8.0   Glucose, UA NEGATIVE NEGATIVE mg/dL   Hgb urine dipstick LARGE (A) NEGATIVE   Bilirubin Urine NEGATIVE NEGATIVE   Ketones, ur NEGATIVE NEGATIVE mg/dL   Protein, ur NEGATIVE NEGATIVE mg/dL   Nitrite NEGATIVE NEGATIVE   Leukocytes, UA NEGATIVE NEGATIVE   RBC / HPF 0-5 0 - 5 RBC/hpf   WBC, UA 0-5 0 - 5 WBC/hpf   Bacteria, UA RARE (A) NONE SEEN   Squamous Epithelial / LPF 6-30 (A) NONE SEEN   Mucus PRESENT    Amorphous Crystal PRESENT     Comment: Performed at [facility], [place] [address], [place],  [address]  Urine rapid drug screen (hosp performed)     Status: None   Collection Time: 10/25/17  2:51 PM  Result Value Ref Range   Opiates NONE DETECTED NONE DETECTED   Cocaine NONE DETECTED NONE DETECTED   Benzodiazepines NONE DETECTED NONE DETECTED   Amphetamines NONE DETECTED NONE DETECTED   Tetrahydrocannabinol NONE DETECTED NONE DETECTED   Barbiturates NONE DETECTED NONE DETECTED    Comment: (NOTE) DRUG SCREEN FOR MEDICAL PURPOSES ONLY.  IF CONFIRMATION IS NEEDED FOR ANY PURPOSE, NOTIFY  LAB WITHIN 5 DAYS. LOWEST DETECTABLE LIMITS FOR URINE DRUG SCREEN Drug Class                     Cutoff (ng/mL) Amphetamine and metabolites    1000 Barbiturate and metabolites    200 Benzodiazepine                 165 Tricyclics and metabolites     300 Opiates and metabolites        300 Cocaine and metabolites        300 THC                            50 Performed at  [facility], [place] [address] [place], [place] [address]     Blood Alcohol level:  Lab Results  Component Value Date   ETH 295 (H) 10/25/2017   ETH 131 (H) 05/13/8526    Metabolic Disorder Labs:  Lab Results  Component Value Date   HGBA1C 4.4 12/09/2013   MPG 80 12/09/2013   No results found for: PROLACTIN No results found for: CHOL, TRIG, HDL, CHOLHDL, VLDL, LDLCALC  Current Medications: Current Facility-Administered Medications  Medication Dose Route Frequency Provider Last Rate Last Dose  . acetaminophen (TYLENOL) tablet 650 mg  650 mg Oral Q6H PRN Ethelene Hal, NP      . alum & mag hydroxide-simeth (MAALOX/MYLANTA) 200-200-20 MG/5ML suspension 30 mL  30 mL Oral Q4H PRN Ethelene Hal, NP      . FLUoxetine (PROZAC) capsule 40 mg  40 mg Oral Daily Ethelene Hal, NP   40 mg at 10/27/17 0757  . hydrOXYzine (ATARAX/VISTARIL) tablet 25 mg  25 mg Oral Q6H PRN Laverle Hobby, PA-C      . levETIRAcetam (KEPPRA) tablet 1,000 mg  1,000 mg Oral BID Ethelene Hal, NP   1,000 mg at 10/27/17 0813  . loperamide (IMODIUM) capsule 2-4 mg  2-4 mg Oral PRN Laverle Hobby, PA-C      . LORazepam (ATIVAN) tablet 1 mg  1 mg Oral Q6H PRN Laverle Hobby, PA-C      . LORazepam (ATIVAN) tablet 1 mg  1 mg Oral QID Patriciaann Clan E, PA-C   1 mg at 10/27/17 0758   Followed by  . [START ON 10/28/2017] LORazepam (ATIVAN) tablet 1 mg  1 mg Oral TID Laverle Hobby, PA-C       Followed by  . [START ON 10/29/2017] LORazepam (ATIVAN) tablet 1 mg  1 mg Oral BID Patriciaann Clan E, PA-C        Followed by  . [START ON 10/31/2017] LORazepam (ATIVAN) tablet 1 mg  1 mg Oral Daily Simon, Spencer E, PA-C      . magnesium hydroxide (MILK OF MAGNESIA) suspension 30 mL  30 mL Oral Daily PRN Ethelene Hal, NP      . multivitamin with minerals tablet 1 tablet  1 tablet Oral Daily Laverle Hobby, PA-C   1 tablet at 10/27/17 0758  . ondansetron (ZOFRAN-ODT) disintegrating tablet 4 mg  4 mg Oral Q6H PRN Laverle Hobby, PA-C   4 mg at 10/27/17 7824  . thiamine (VITAMIN B-1) tablet 100 mg  100 mg Oral Daily Patriciaann Clan E, PA-C   100 mg at 10/27/17 0758  . traZODone (DESYREL) tablet 50 mg  50 mg Oral QHS PRN Ethelene Hal, NP      . ziprasidone (GEODON) injection 20 mg  20 mg Intramuscular Q12H PRN Ethelene Hal, NP       PTA Medications: Medications Prior to Admission  Medication Sig Dispense Refill Last Dose  . acetaminophen (TYLENOL) 500 MG tablet Take 500 mg by mouth daily as needed.   10/25/2017 at Unknown time  . FLUoxetine (PROZAC) 40 MG capsule Take 1 capsule (40 mg total) by mouth daily. For depression 30 capsule 0 10/25/2017 at Unknown time  . levETIRAcetam (KEPPRA) 1000 MG tablet Take 1 tablet (1,000 mg total) by mouth 2 (two) times daily. For seizure activities 14 tablet 0 10/25/2017 at Unknown time  . norethindrone (AYGESTIN) 5 MG tablet Take 1 tablet (5 mg total) by mouth 3 (three) times  daily. You may decrease to twice daily and then once daily as bleeding improves 90 tablet 0 Past Month at Unknown time  . ondansetron (ZOFRAN ODT) 4 MG disintegrating tablet Take 1 tablet (4 mg total) by mouth every 8 (eight) hours as needed for nausea or vomiting. 20 tablet 0 Past Month at Unknown time  . oxyCODONE-acetaminophen (PERCOCET) 5-325 MG tablet Take 1-2 tablets by mouth every 4 (four) hours as needed. (Patient not taking: Reported on 10/25/2017) 8 tablet 0 Not Taking at Unknown time    Musculoskeletal: Strength & Muscle Tone: within normal limits Gait & Station:  normal Patient leans: N/A  Psychiatric Specialty Exam: Physical Exam  Nursing note and vitals reviewed. Constitutional: She is oriented to person, place, and time. She appears well-developed and well-nourished.  HENT:  Head: Normocephalic and atraumatic.  Respiratory: Effort normal.  Musculoskeletal: Normal range of motion.  Neurological: She is alert and oriented to person, place, and time.    ROS  Blood pressure 111/76, pulse 99, temperature 97.7 F (36.5 C), temperature source Oral, resp. rate 18, height _0  (1.676 m), weight 67.1 kg (148 lb).Body mass index is 23.89 kg/m.  General Appearance: Casual  Eye Contact:  Fair  Speech:  Normal Rate  Volume:  Decreased  Mood:  Depressed  Affect:  Appropriate  Thought Process:  Coherent  Orientation:  Full (Time, Place, and Person)  Thought Content:  Logical  Suicidal Thoughts:  Yes.  without intent/plan  Homicidal Thoughts:  No  Memory:  Immediate;   Fair  Judgement:  Intact  Insight:  Fair  Psychomotor Activity:  Psychomotor Retardation  Concentration:  Concentration: Fair  Recall:  Junction of Knowledge:  Good  Language:  Fair  Akathisia:  No  Handed:  Right  AIMS (if indicated):     Assets:  Communication Skills Desire for Improvement Housing Resilience Social Support  ADL's:  Intact  Cognition:  WNL  Sleep:  Number of Hours: 6.75    Treatment Plan Summary: Daily contact with patient to assess and evaluate symptoms and progress in treatment, Medication management and Plan Patient is seen and examined.  Patient is a 42 year old female with the above-stated past psychiatric and medical history who was admitted to the behavioral health Hospital due to suicidal ideation.  She is also here for detox.  She will be placed on the detox protocol.  Her antidepressant medications will be continued.  She will be placed on seizure precautions.  Her seizure medications will be continued.  She will be integrated into the milieu.   She will be seen by social work.  We will attempt to get her into a rehabilitation facility.  She will attend all groups.  She will see individual counseling from social work.  We will try and get some medication for her proctitis.  Otherwise hopefully things will improve and she will be able to get to a rehabilitation facility.  Observation Level/Precautions:  Detox 15 minute checks Seizure  Laboratory:  CBC Chemistry Profile GGT  Psychotherapy:    Medications:    Consultations:    Discharge Concerns:    Estimated LOS:  Other:     Physician Treatment Plan for Primary Diagnosis: <principal problem not specified> Long Term Goal(s): Improvement in symptoms so as ready for discharge  Short Term Goals: Ability to identify changes in lifestyle to reduce recurrence of condition will improve, Ability to verbalize feelings will improve, Ability to disclose and discuss suicidal ideas, Ability to demonstrate self-control will improve,  Ability to identify and develop effective coping behaviors will improve, Ability to maintain clinical measurements within normal limits will improve, Compliance with prescribed medications will improve and Ability to identify triggers associated with substance abuse/mental health issues will improve  Physician Treatment Plan for Secondary Diagnosis: Active Problems:   MDD (major depressive disorder), recurrent severe, without psychosis ([place])  Long Term Goal(s): Improvement in symptoms so as ready for discharge  Short Term Goals: Ability to identify changes in lifestyle to reduce recurrence of condition will improve, Ability to verbalize feelings will improve, Ability to disclose and discuss suicidal ideas, Ability to demonstrate self-control will improve, Ability to identify and develop effective coping behaviors will improve, Ability to maintain clinical measurements within normal limits will improve, Compliance with prescribed medications will improve and Ability to  identify triggers associated with substance abuse/mental health issues will improve  I certify that inpatient services furnished can reasonably be expected to improve the patient's condition.    Sharma Covert, MD 4/10/201910:48 AM

## 2017-10-27 NOTE — Progress Notes (Signed)
[place] Group Notes:  (Nursing/MHT/Case Management/Adjunct)  Date:  10/27/2017  Time:  1600 Type of Therapy:  Nurse Education/Recovery   Participation Level:  Active  Participation Quality:  Appropriate  Affect:  Appropriate  Cognitive:  Appropriate  Insight:  Appropriate  Engagement in Group:  Engaged  Modes of Intervention:  Discussion and Education  Summary of Progress/Problems:  Kristen Roberson L

## 2017-10-27 NOTE — Progress Notes (Signed)
Pt presents with a sad affect and depressed mood. Pt rates depression 3/10. Anxiety 3/10. Pt denies SI/HI. Pt reports poor sleep last night due to staff not waking her up for bedtime meds. Pt reports withdrawal symptoms of  weakness and irritability. Pt expressed feeling depressed today because her fiance recently passed away. Pt verbalized tolerating her meds well. Pt denies any side effects to meds. Orders reviewed with pt. Verbal support provided. Pt encouraged to attend groups. 15 minute checks performed for safety. Pt compliant with tx plan.

## 2017-10-27 NOTE — BHH Group Notes (Signed)
LCSW Group Therapy Note 10/27/2017 3:32 PM  Type of Therapy/Topic: Group Therapy: Emotion Regulation  Participation Level: Active   Description of Group:  The purpose of this group is to assist patients in learning to regulate negative emotions and experience positive emotions. Patients will be guided to discuss ways in which they have been vulnerable to their negative emotions. These vulnerabilities will be juxtaposed with experiences of positive emotions or situations, and patients will be challenged to use positive emotions to combat negative ones. Special emphasis will be placed on coping with negative emotions in conflict situations, and patients will process healthy conflict resolution skills.  Therapeutic Goals: 1. Patient will identify two positive emotions or experiences to reflect on in order to balance out negative emotions 2. Patient will label two or more emotions that they find the most difficult to experience 3. Patient will demonstrate positive conflict resolution skills through discussion and/or role plays  Summary of Patient Progress:  Prescilla was engaged and participated throughout the group session. Sydni reports that emotional regulation is "turning her social life around". Machele states that she struggles controlling her feelings of hopelessness. Margrett states that she notices her anxiety increases when she begins to feel hopelessness. Ceonna states that the death of her fiance has caused her to feel more hopeless. Ziyana reports that she does not know how she plans to increase her emotional regulations skills at this time.    Therapeutic Modalities:  Cognitive Behavioral Therapy Feelings Identification Dialectical Behavioral Therapy   Theresa Duty Clinical Social Worker
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
## 2017-10-27 NOTE — Progress Notes (Signed)
Recreation Therapy Notes  Date: 4.10.19 Time: 9:30 a.m. Location: [address] Dayroom   Group Topic: Stress Management   Goal Area(s) Addresses:  Goal 1.1: To reduce stress  -Patient will feel a reduction in stress level  -Patient will learn the importance of stress management  -Patient will participate during stress management group      Intervention: Stress Management  Activity: Meditation- Patients were in a peaceful environment with soft lighting enhancing patients mood. Patients listened to a deep concentration meditation to help decrease stress levels   Education: Stress Management, Discharge Planning.    Education Outcome: Acknowledges edcuation/In group clarification offered/Needs additional education   Clinical Observations/Feedback:: Patient did not attend     Ranell Patrick, Recreation Therapy Intern   Ranell Patrick 10/27/2017 8:34 AM
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
## 2017-10-28 DIAGNOSIS — G4700 Insomnia, unspecified: Secondary | ICD-10-CM

## 2017-10-28 NOTE — BHH Group Notes (Signed)
Pt was invited but did not attend orientation/goals group. 

## 2017-10-28 NOTE — Progress Notes (Signed)
Pt reports she is doing better and may possibly discharge tomorrow.  She plans to stay with a friend and then transition to an [employer].  She is still grieving the death of her fiance, but knows it will take some time to deal with her emotional pain.  She plans to continue grief therapy for a while.  She denies SI/HI/AVH.  She denies any withdrawal symptoms.  She is pleasant and cooperative with staff, and makes her needs know appropriately.  Support and encouragement offered.  Discharge plans are in process.  Safety maintained with q15 minute checks.

## 2017-10-28 NOTE — Plan of Care (Signed)
D: Kristen Roberson has been calm and pleasant today. She has remained in her room for much of the day. She is grieving the loss of her fiance five months ago. She says she will not return to the home she shared with him but will be going to live with a friend until possibly going to an [employer]. On her self inventory form, she rated her depression, anxiety, and feelings of hopelessness all 3/10. She also reported good sleep, fair appetite, normal energy level, and good concentration.   A: Meds given as ordered. Q15 safety checks maintained. Support/encouragement offered.  R: Pt remains free from harm and continues with treatment. Will continue to monitor for needs/safety.   Problem: Education: Goal: Emotional status will improve Outcome: Progressing Goal: Mental status will improve Outcome: Progressing   Problem: Activity: Goal: Interest or engagement in activities will improve Outcome: Progressing   Problem: Coping: Goal: Ability to demonstrate self-control will improve Outcome: Progressing   Problem: Physical Regulation: Goal: Ability to maintain clinical measurements within normal limits will improve Outcome: Progressing   Problem: Safety: Goal: Periods of time without injury will increase Outcome: Progressing   Problem: Medication: Goal: Compliance with prescribed medication regimen will improve Outcome: Progressing   Problem: Self-Concept: Goal: Ability to disclose and discuss suicidal ideas will improve Outcome: Progressing

## 2017-10-28 NOTE — Progress Notes (Signed)
[facility] MD Progress Note  10/28/2017 11:46 AM Kristen Roberson  MRN:  [id number]   Subjective: Patient is a 42 year old white female.  She reports that she is feeling very good today.  She denies any withdrawal symptoms.  She states that the scheduled Ativan makes her very sleepy throughout the day.  She is requesting to stop the scheduled Ativan.  She reports that she is hoping to be going to an [employer], however she has not been accepted to one yet.  She also has concerns for upcoming eviction from her apartment on April 15.  She denies any SI/HI/AVH.  Patient denies any medication side effects.  Patient is hoping for discharge soon so that she can remove her personal belongings from her apartment before her eviction date.  Objective: Patient's chart and findings reviewed and discussed with treatment team.  Patient presents on day room attending group.  Patient is very pleasant and cooperative.  He agreed to stop scheduled Ativan, but will continue Ativan as needed for seizure greater than 10.  Discussed with social worker and patient to possibly go to [employer].  CSW discussed with patient and patient is willing to attend her own rescue [facility].  Patient is potentially to be discharged tomorrow.   Principal Problem: MDD (major depressive disorder), recurrent severe, without psychosis ([place]) Diagnosis:   Patient Active Problem List   Diagnosis Date Noted  . MDD (major depressive disorder), recurrent severe, without psychosis ([place]) [F33.2] 10/26/2017  . UTI (urinary tract infection) [N39.0] 09/20/2017  . MDD (major depressive disorder) [F32.9] 01/16/2017  . Alcohol dependence with withdrawal, uncomplicated ([place]) [C94.709] 03/17/2015  . Hepatic encephalopathy ([place]) [K72.90] 02/23/2015  . Seizure ([place]) [R56.9] 02/23/2015  . Pancytopenia ([place]) [G28.366] 02/23/2015  . Internal hemorrhoids [K64.8] 02/23/2015  . Alcohol intoxication ([place]) [F10.929]   . Hemorrhoid [K64.9]   . C. difficile  colitis [A04.72]   . Alcohol abuse [F10.10]   . Elevated liver enzymes [R74.8]   . Colitis [K52.9] 12/12/2014  . Abrasion of wrist [S60.819A]   . Alcohol dependence with uncomplicated withdrawal ([place]) [F10.230]   . GAD (generalized anxiety disorder) [F41.1] 07/01/2014  . Alcohol withdrawal syndrome without complication ([place]) [Q94.765] 06/30/2014  . MDD (major depressive disorder), recurrent episode, moderate ([place]) [F33.1]   . Alcohol use disorder, severe, dependence ([place]) [F10.20]   . Alcohol dependence with withdrawal with complication ([place]) [Y65.035] 05/19/2014  . Thrombocytopenia ([place]) [D69.6] 05/07/2014  . Hypokalemia [E87.6] 05/07/2014  . Right sided weakness [R53.1] 04/15/2014  . Seizures ([place]) [R56.9] 04/04/2014  . Substance induced mood disorder ([place]) [F19.94] 03/26/2014  . Status post myocardial infarction [I25.2] 03/06/2014  . Depression [F32.9] 03/06/2014  . Substance abuse ([place]) [F19.10] 02/22/2014  . Acute respiratory failure with hypoxia ([place]) [J96.01] 02/22/2014  . Ventricular fibrillation ([place]) [I49.01] 02/22/2014  . Acute systolic heart failure - s/p VF Cardiac Arrest [I50.21] 02/22/2014  . Acute encephalopathy [G93.40] 02/22/2014  . Acute confusional state [F05] 02/22/2014  . Moderate malnutrition ([place]) [E44.0] 02/21/2014  . Convulsions/seizures ([place]) [R56.9] 02/14/2014  . Cardiac arrest ([place]) [I46.9] 02/13/2014  . Alcohol dependence ([place]) [F10.20] 01/06/2014  . Severe alcohol use disorder ([place]) [F10.20] 01/06/2014  . Adjustment disorder with depressed mood [F43.21] 12/14/2013  . 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 Folate deficiency [E53.8] 12/11/2013  . Alcoholism ([place]) [F10.20] 12/11/2013  . Alcohol withdrawal ([place]) [F10.239] 12/11/2013  . Hypokalemia [E87.6] 12/11/2013  . Malnutrition of moderate degree ([place]) [E44.0] 12/10/2013  . Abdominal pain [R10.9] 11/25/2013  .  Mallory-Weiss tear [K22.6] 11/25/2013  . Acute blood loss anemia  [D62] 11/24/2013  . Hematemesis [K92.0] 11/23/2013  . Fatty liver [K76.0] 10/29/2013  . Internal hemorrhoids with other complication [E36.6] 29/47/6546  . Hematochezia [K92.1] 10/28/2013  . Normocytic anemia [D64.9] 10/28/2013  . GIB (gastrointestinal bleeding) [K92.2] 10/28/2013  . Anxiety [F41.9]   . Post-operative state [Z98.890] 09/29/2013  . Postoperative state [Z98.890] 09/28/2013  . Female pelvic pain [R10.2] 09/21/2013  . Menorrhagia [N92.0] 09/21/2013  . History of ovarian cyst [Z87.42] 09/21/2013  . 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 Dysmenorrhea [N94.6] 09/21/2013   Total Time spent with patient: 15 minutes  Past Psychiatric History: See H&P  Past Medical History:  Past Medical History:  Diagnosis Date  . Alcohol abuse   . Anemia   . Anxiety   . Blood transfusion without reported diagnosis   . Cardiac arrest ([place])   . Cysts of both ovaries   . Depression   . Fatty liver 10/05/13  . Proctitis   . Seizures ([place])     Past Surgical History:  Procedure Laterality Date  . APPENDECTOMY    . colitis    . COLONOSCOPY N/A 09/30/2013   Procedure: COLONOSCOPY;  Surgeon: Lafayette Dragon, MD;  Location: [facility];  Service: Endoscopy;  Laterality: N/A;  . ESOPHAGOGASTRODUODENOSCOPY N/A 11/23/2013   Procedure: ESOPHAGOGASTRODUODENOSCOPY (EGD);  Surgeon: Jerene Bears, MD;  Location: [facility];  Service: Endoscopy;  Laterality: N/A;  . LAPAROSCOPIC APPENDECTOMY Right 09/28/2013   Procedure: APPENDECTOMY LAPAROSCOPIC;  Surgeon: Terrance Mass, MD;  Location: [facility];  Service: Gynecology;  Laterality: Right;  . LAPAROSCOPY N/A 09/28/2013   Procedure: LAPAROSCOPY OPERATIVE;  Surgeon: Terrance Mass, MD;  Location: [facility];  Service: Gynecology;  Laterality: N/A;  . LEFT AND RIGHT HEART CATHETERIZATION WITH CORONARY ANGIOGRAM N/A 02/23/2014   Procedure: LEFT AND RIGHT HEART CATHETERIZATION WITH CORONARY ANGIOGRAM;  Surgeon: Leonie Man, MD;  Location: [facility] CATH LAB;  Service:  Cardiovascular;  Laterality: N/A;  . OVARIAN CYST REMOVAL    . SALPINGOOPHORECTOMY Right 09/28/2013   Procedure: SALPINGO OOPHORECTOMY;  Surgeon: Terrance Mass, MD;  Location: [facility];  Service: Gynecology;  Laterality: Right;   Family History:  Family History  Problem Relation Age of Onset  . Diabetes Mother   . Hyperlipidemia Mother   . Stroke Mother   . Diabetes Father    Family Psychiatric  History: See H&P Social History:  Social History   Substance and Sexual Activity  Alcohol Use Yes   Comment: States she does not drink anymore     Social History   Substance and Sexual Activity  Drug Use No    Social History   Socioeconomic History  . Marital status: Legally Separated    Spouse name: Not on file  . Number of children: Not on file  . Years of education: Not on file  . Highest education level: Not on file  Occupational History  . Not on file  Social Needs  . Financial resource strain: Not on file  . Food insecurity:    Worry: Not on file    Inability: Not on file  . Transportation needs:    Medical: Not on file    Non-medical: Not on file  Tobacco Use  . Smoking status: Never Smoker  . Smokeless tobacco: Never Used  Substance and Sexual Activity  . Alcohol use: Yes    Comment: States she does not drink anymore  . Drug use: No  . Sexual  activity: Not on file  Lifestyle  . Physical activity:    Days per week: Not on file    Minutes per session: Not on file  . Stress: Not on file  Relationships  . Social connections:    Talks on phone: Not on file    Gets together: Not on file    Attends religious service: Not on file    Active member of club or organization: Not on file    Attends meetings of clubs or organizations: Not on file    Relationship status: Not on file  Other Topics Concern  . Not on file  Social History Narrative  . Not on file   Additional Social History:                         Sleep: Good  Appetite:   Good  Current Medications: Current Facility-Administered Medications  Medication Dose Route Frequency Provider Last Rate Last Dose  . acetaminophen (TYLENOL) tablet 650 mg  650 mg Oral Q6H PRN Ethelene Hal, NP      . alum & mag hydroxide-simeth (MAALOX/MYLANTA) 200-200-20 MG/5ML suspension 30 mL  30 mL Oral Q4H PRN Ethelene Hal, NP      . FLUoxetine (PROZAC) capsule 40 mg  40 mg Oral Daily Ethelene Hal, NP   40 mg at 10/28/17 4580  . hydrOXYzine (ATARAX/VISTARIL) tablet 25 mg  25 mg Oral Q6H PRN Laverle Hobby, PA-C      . levETIRAcetam (KEPPRA) tablet 1,000 mg  1,000 mg Oral BID Ethelene Hal, NP   1,000 mg at 10/28/17 9983  . loperamide (IMODIUM) capsule 2-4 mg  2-4 mg Oral PRN Laverle Hobby, PA-C      . LORazepam (ATIVAN) tablet 1 mg  1 mg Oral Q6H PRN Patriciaann Clan E, PA-C      . magnesium hydroxide (MILK OF MAGNESIA) suspension 30 mL  30 mL Oral Daily PRN Ethelene Hal, NP      . multivitamin with minerals tablet 1 tablet  1 tablet Oral Daily Laverle Hobby, PA-C   1 tablet at 10/28/17 3825  . ondansetron (ZOFRAN-ODT) disintegrating tablet 4 mg  4 mg Oral Q6H PRN Laverle Hobby, PA-C   4 mg at 10/27/17 2009  . thiamine (VITAMIN B-1) tablet 100 mg  100 mg Oral Daily Patriciaann Clan E, PA-C   100 mg at 10/28/17 0539  . traZODone (DESYREL) tablet 50 mg  50 mg Oral QHS PRN Ethelene Hal, NP   50 mg at 10/27/17 2123  . ziprasidone (GEODON) injection 20 mg  20 mg Intramuscular Q12H PRN Ethelene Hal, NP        Lab Results: No results found for this or any previous visit (from the past 59 hour(s)).  Blood Alcohol level:  Lab Results  Component Value Date   ETH 295 (H) 10/25/2017   ETH 131 (H) 76/73/4193    Metabolic Disorder Labs: Lab Results  Component Value Date   HGBA1C 4.4 12/09/2013   MPG 80 12/09/2013   No results found for: PROLACTIN No results found for: CHOL, TRIG, HDL, CHOLHDL, VLDL, LDLCALC  Physical  Findings: AIMS: Facial and Oral Movements Muscles of Facial Expression: None, normal Lips and Perioral Area: None, normal Jaw: None, normal Tongue: None, normal,Extremity Movements Upper (arms, wrists, hands, fingers): None, normal Lower (legs, knees, ankles, toes): None, normal, Trunk Movements Neck, shoulders, hips: None, normal, Overall Severity Severity of abnormal  movements (highest score from questions above): None, normal Incapacitation due to abnormal movements: None, normal Patient's awareness of abnormal movements (rate only patient's report): No Awareness, Dental Status Current problems with teeth and/or dentures?: No Does patient usually wear dentures?: No  CIWA:  CIWA-Ar Total: 1 COWS:     Musculoskeletal: Strength & Muscle Tone: within normal limits Gait & Station: normal Patient leans: N/A  Psychiatric Specialty Exam: Physical Exam  Nursing note and vitals reviewed. Constitutional: She is oriented to person, place, and time. She appears well-developed and well-nourished.  Respiratory: Effort normal.  Musculoskeletal: Normal range of motion.  Neurological: She is alert and oriented to person, place, and time.  Skin: Skin is warm.    Review of Systems  Constitutional: Negative.   HENT: Negative.   Eyes: Negative.   Respiratory: Negative.   Cardiovascular: Negative.   Gastrointestinal: Negative.   Genitourinary: Negative.   Musculoskeletal: Negative.   Skin: Negative.   Neurological: Negative.   Endo/Heme/Allergies: Negative.     Blood pressure 94/65, pulse (!) 114, temperature 97.7 F (36.5 C), temperature source Oral, resp. rate 18, height 5\' 6"  (1.676 m), weight 67.1 kg (148 lb).Body mass index is 23.89 kg/m.  General Appearance: Casual  Eye Contact:  Good  Speech:  Clear and Coherent and Normal Rate  Volume:  Normal  Mood:  Euthymic  Affect:  Congruent  Thought Process:  Goal Directed and Descriptions of Associations: Intact  Orientation:  Full  (Time, Place, and Person)  Thought Content:  WDL  Suicidal Thoughts:  No  Homicidal Thoughts:  No  Memory:  Immediate;   Good Recent;   Good Remote;   Good  Judgement:  Fair  Insight:  Fair  Psychomotor Activity:  Normal  Concentration:  Concentration: Good and Attention Span: Good  Recall:  Good  Fund of Knowledge:  Good  Language:  Good  Akathisia:  No  Handed:  Right  AIMS (if indicated):     Assets:  Communication Skills Desire for Improvement Financial Resources/Insurance Social Support  ADL's:  Intact  Cognition:  WNL  Sleep:  Number of Hours: 6.75   Problems addressed MDD severe recurrent Alcohol abuse  Treatment Plan Summary: Daily contact with patient to assess and evaluate symptoms and progress in treatment, Medication management and Plan is to:  -Continue Prozac 40 mg p.o. daily for mood stability -Continue Vistaril 25 mg every 6 hours as needed for anxiety -Stop scheduled Ativan for CIWA -Continue Ativan PRN for CIWA -Continue trazodone 50 mg p.o. nightly as needed for insomnia -Encourage group therapy participation  Lewis Shock, FNP 10/28/2017, 11:46 AM

## 2017-10-28 NOTE — B[facility] Suicide Risk Assessment (Addendum)
[facility] INPATIENT:  Family/Significant Other Suicide Prevention Education  Suicide Prevention Education:  Education Completed; Su Grand, friend/neighbor [phone]) has been identified by the patient as the family member/significant other with whom the patient will be residing, and identified as the person(s) who will aid the patient in the event of a mental health crisis (suicidal ideations/suicide attempt).  With written consent from the patient, the family member/significant other has been provided the following suicide prevention education, prior to the and/or following the discharge of the patient.  The suicide prevention education provided includes the following:  Suicide risk factors  Suicide prevention and interventions  National Suicide Hotline telephone number  [facility] assessment telephone number  [facility] Emergency Assistance [place] and/or Residential Mobile Crisis Unit telephone number  Request made of family/significant other to:  Remove weapons (e.g., guns, rifles, knives), all items previously/currently identified as safety concern.    Remove drugs/medications (over-the-counter, prescriptions, illicit drugs), all items previously/currently identified as a safety concern.  The family member/significant other verbalizes understanding of the suicide prevention education information provided.  The family member/significant other agrees to remove the items of safety concern listed above.  Marylee Floras 10/28/2017, 12:40 PM

## 2017-10-28 NOTE — B[facility] Group Notes (Signed)
LCSW Group Therapy Note 10/28/2017 2:17 PM  Type of Therapy and Topic: Group Therapy: Avoiding Self-Sabotaging and Enabling Behaviors  Participation Level: Active  Description of Group:  In this group, patients will learn how to identify obstacles, self-sabotaging and enabling behaviors, as well as: what are they, why do we do them and what needs these behaviors meet. Discuss unhealthy relationships and how to have positive healthy boundaries with those that sabotage and enable. Explore aspects of self-sabotage and enabling in yourself and how to limit these self-destructive behaviors in everyday life.  Therapeutic Goals: 1. Patient will identify one obstacle that relates to self-sabotage and enabling behaviors 2. Patient will identify one personal self-sabotaging or enabling behavior they did prior to admission 3. Patient will state a plan to change the above identified behavior 4. Patient will demonstrate ability to communicate their needs through discussion and/or role play.   Summary of Patient Progress:  Kristen Roberson was engaged and participated throughout the group session. Kristen Roberson states that self sabotaging behaviors she has is "hurting myself when I drink alcohol". Kristen Roberson states that since her fiance' passed away 4 months ago, she has identified multiple self sabotaging behaviors that has kept her from accomplishing her goals. Kristen Roberson reports that her main goal currently is "getting my life back on track". Kristen Roberson reports that she is very motivated to change her life and plans to continue to follow  Up with her outpatient providers for additional support at discharge.   Therapeutic Modalities:  Cognitive Behavioral Therapy Person-Centered Therapy Motivational Interviewing   [place] Clinical Social Worker

## 2017-10-28 NOTE — Progress Notes (Signed)
Pt reports she had a better day.  At the beginning of the shift, she was observed sitting in the dayroom talking with peers.  She denies SI/HI/AVH at this time.  She reports minor withdrawal symptoms today.  She states her anxiety is moderate, but her depression is still high as she continues to grieve her fiance's sudden death from cardiac arrest 4 months ago.  She says she knows she has to get beyond her grief, but she misses him so much and that they were going to get married in July.  She has been pleasant and appropriate.  She is med compliant and makes her needs known to staff.  Support and encouragement offered.  Discharge plans are in process.  She is looking to stay at an [facility] after discharge and f/u with [facility].  Safety maintained with q15 minute checks.

## 2017-10-29 LAB — AMYLASE: Amylase: 118 U/L — ABNORMAL HIGH (ref 28–100)

## 2017-10-29 LAB — LIPASE, BLOOD: LIPASE: 38 U/L (ref 11–51)

## 2017-10-29 MED ORDER — FLUOXETINE HCL 40 MG PO CAPS: 40.0000 mg | ORAL_CAPSULE | Freq: Every day | ORAL | 0 refills | Status: DC

## 2017-10-29 MED ORDER — TRAZODONE HCL 50 MG PO TABS: 50.0000 mg | ORAL_TABLET | Freq: Every evening | ORAL | 0 refills | Status: DC | PRN

## 2017-10-29 MED ORDER — LEVETIRACETAM 1000 MG PO TABS: 1000.0000 mg | ORAL_TABLET | Freq: Two times a day (BID) | ORAL | 0 refills | Status: DC

## 2017-10-29 MED ORDER — HYDROXYZINE HCL 25 MG PO TABS: ORAL_TABLET | ORAL | 0 refills | Status: DC

## 2017-10-29 NOTE — Progress Notes (Signed)
Pt attend wrap up group. Her day was a 8. Her goal was to figure out what her plan will be once she leaves. She know she has a lot work ahead of her. She has a support system and she need to work on this to ensure this is what she will need. Pt said she need to clear her mind and stay focus. She need to figure out what her plan will be.

## 2017-10-29 NOTE — Discharge Summary (Signed)
Physician Discharge Summary Note  Patient:  Kristen Roberson is an 42 y.o., female MRN:  [id number] DOB:  1976/01/27 Patient phone:  [phone] (home)   Patient address:   [place] [address],  Total Time spent with patient: Greater than 30 minutes  Date of Admission:  10/26/2017  Date of Discharge: 10/29/2017  Reason for Admission: Worsening depression & increased alcohol use.  Principal Problem: MDD (major depressive disorder), recurrent severe, without psychosis [facility])  Discharge Diagnoses: Patient Active Problem List   Diagnosis Date Noted  . MDD (major depressive disorder), recurrent severe, without psychosis ([place]) [F33.2] 10/26/2017  . UTI (urinary tract infection) [N39.0] 09/20/2017  . MDD (major depressive disorder) [F32.9] 01/16/2017  . Alcohol dependence with withdrawal, uncomplicated ([place]) [Y86.578] 03/17/2015  . Hepatic encephalopathy ([place]) [K72.90] 02/23/2015  . Seizure ([place]) [R56.9] 02/23/2015  . Pancytopenia ([place]) [I69.629] 02/23/2015  . Internal hemorrhoids [K64.8] 02/23/2015  . Alcohol intoxication ([place]) [F10.929]   . Hemorrhoid [K64.9]   . C. difficile colitis [A04.72]   . Alcohol abuse [F10.10]   . Elevated liver enzymes [R74.8]   . Colitis [K52.9] 12/12/2014  . Abrasion of wrist [S60.819A]   . Alcohol dependence with uncomplicated withdrawal ([place]) [F10.230]   . GAD (generalized anxiety disorder) [F41.1] 07/01/2014  . Alcohol withdrawal syndrome without complication ([place]) [B28.413] 06/30/2014  . MDD (major depressive disorder), recurrent episode, moderate ([place]) [F33.1]   . Alcohol use disorder, severe, dependence ([place]) [F10.20]   . Alcohol dependence with withdrawal with complication ([place]) [K44.010] 05/19/2014  . Thrombocytopenia ([place]) [D69.6] 05/07/2014  . Hypokalemia [E87.6] 05/07/2014  . Right sided weakness [R53.1] 04/15/2014  . Seizures ([place]) [R56.9] 04/04/2014  . Substance induced mood disorder ([place]) [F19.94] 03/26/2014  . Status  post myocardial infarction [I25.2] 03/06/2014  . Depression [F32.9] 03/06/2014  . Substance abuse ([place]) [F19.10] 02/22/2014  . Acute respiratory failure with hypoxia ([place]) [J96.01] 02/22/2014  . Ventricular fibrillation ([place]) [I49.01] 02/22/2014  . Acute systolic heart failure - s/p VF Cardiac Arrest [I50.21] 02/22/2014  . Acute encephalopathy [G93.40] 02/22/2014  . Acute confusional state [F05] 02/22/2014  . Moderate malnutrition ([place]) [E44.0] 02/21/2014  . Convulsions/seizures ([place]) [R56.9] 02/14/2014  . Cardiac arrest ([place]) [I46.9] 02/13/2014  . Alcohol dependence ([place]) [F10.20] 01/06/2014  . Severe alcohol use disorder ([place]) [F10.20] 01/06/2014  . Adjustment disorder with depressed mood [F43.21] 12/14/2013  . 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 Folate deficiency [E53.8] 12/11/2013  . Alcoholism ([place]) [F10.20] 12/11/2013  . Alcohol withdrawal ([place]) [F10.239] 12/11/2013  . Hypokalemia [E87.6] 12/11/2013  . Malnutrition of moderate degree ([place]) [E44.0] 12/10/2013  . Abdominal pain [R10.9] 11/25/2013  . Mallory-Weiss tear [K22.6] 11/25/2013  . Acute blood loss anemia [D62] 11/24/2013  . Hematemesis [K92.0] 11/23/2013  . Fatty liver [K76.0] 10/29/2013  . Internal hemorrhoids with other complication [U72.5] 36/64/4034  . Hematochezia [K92.1] 10/28/2013  . Normocytic anemia [D64.9] 10/28/2013  . GIB (gastrointestinal bleeding) [K92.2] 10/28/2013  . Anxiety [F41.9]   . Post-operative state [Z98.890] 09/29/2013  . Postoperative state [Z98.890] 09/28/2013  . Female pelvic pain [R10.2] 09/21/2013  . Menorrhagia [N92.0] 09/21/2013  . History of ovarian cyst [Z87.42] 09/21/2013  . 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 Dysmenorrhea [N94.6] 09/21/2013   Past Psychiatric History: Alcoholism, chronic, Major depression.  Past Medical History:  Past Medical History:  Diagnosis Date  . Alcohol abuse   . Anemia   . Anxiety   . Blood transfusion without reported  diagnosis   . Cardiac arrest ([place])   .  Cysts of both ovaries   . Depression   . Fatty liver 10/05/13  . Proctitis   . Seizures ([place])     Past Surgical History:  Procedure Laterality Date  . APPENDECTOMY    . colitis    . COLONOSCOPY N/A 09/30/2013   Procedure: COLONOSCOPY;  Surgeon: Lafayette Dragon, MD;  Location: [facility];  Service: Endoscopy;  Laterality: N/A;  . ESOPHAGOGASTRODUODENOSCOPY N/A 11/23/2013   Procedure: ESOPHAGOGASTRODUODENOSCOPY (EGD);  Surgeon: Jerene Bears, MD;  Location: [facility];  Service: Endoscopy;  Laterality: N/A;  . LAPAROSCOPIC APPENDECTOMY Right 09/28/2013   Procedure: APPENDECTOMY LAPAROSCOPIC;  Surgeon: Terrance Mass, MD;  Location: [facility];  Service: Gynecology;  Laterality: Right;  . LAPAROSCOPY N/A 09/28/2013   Procedure: LAPAROSCOPY OPERATIVE;  Surgeon: Terrance Mass, MD;  Location: [facility];  Service: Gynecology;  Laterality: N/A;  . LEFT AND RIGHT HEART CATHETERIZATION WITH CORONARY ANGIOGRAM N/A 02/23/2014   Procedure: LEFT AND RIGHT HEART CATHETERIZATION WITH CORONARY ANGIOGRAM;  Surgeon: Leonie Man, MD;  Location[facility] LAB;  Service: Cardiovascular;  Laterality: N/A;  . OVARIAN CYST REMOVAL    . SALPINGOOPHORECTOMY Right 09/28/2013   Procedure: SALPINGO OOPHORECTOMY;  Surgeon: Terrance Mass, MD;  Location: [facility];  Service: Gynecology;  Laterality: Right;   Family History:  Family History  Problem Relation Age of Onset  . Diabetes Mother   . Hyperlipidemia Mother   . Stroke Mother   . Diabetes Father    Family Psychiatric  History: See H&P  Social History:  Social History   Substance and Sexual Activity  Alcohol Use Yes   Comment: States she does not drink anymore     Social History   Substance and Sexual Activity  Drug Use No    Social History   Socioeconomic History  . Marital status: Legally Separated    Spouse name: Not on file  . Number of children: Not on file  . Years of education: Not on file  . Highest  education level: Not on file  Occupational History  . Not on file  Social Needs  . Financial resource strain: Not on file  . Food insecurity:    Worry: Not on file    Inability: Not on file  . Transportation needs:    Medical: Not on file    Non-medical: Not on file  Tobacco Use  . Smoking status: Never Smoker  . Smokeless tobacco: Never Used  Substance and Sexual Activity  . Alcohol use: Yes    Comment: States she does not drink anymore  . Drug use: No  . Sexual activity: Not on file  Lifestyle  . Physical activity:    Days per week: Not on file    Minutes per session: Not on file  . Stress: Not on file  Relationships  . Social connections:    Talks on phone: Not on file    Gets together: Not on file    Attends religious service: Not on file    Active member of club or organization: Not on file    Attends meetings of clubs or organizations: Not on file    Relationship status: Not on file  Other Topics Concern  . Not on file  Social History Narrative  . Not on file   Hospital Course: (Per Md's admission assessment): Patient is seen and examined.  Patient was seen by treatment team today including nursing as well as social work.  Patient is a 42 year old female with a  past psychiatric history significant for major depression as well as alcohol use disorder.  She presented to the [facility] emergency department yesterday with suicidal ideation.  She stated that she has been more depressed, and her alcohol use had increased since the death of her fianc in 2017-07-07.  He had died of cardiac reasons unexpectedly.  She had been drinking previous to that, but after his death things became much worse.  Prior to coming to the emergency room she had attempted to cut her wrist bilaterally.  She then presented to the emergency room.  It was 295.  Her drug screen was negative.  She had one previous psychiatric admission in June 2018 under similar circumstances.  She has a  history of a seizure disorder that is apparently not related to her alcohol use disorder.  She admitted to helplessness, hopelessness and worthlessness.  She admitted crying spells.  She was admitted to the hospital for evaluation and stabilization.  After the above admision assessment, Kobe's presenting symptoms were identified. Her toxicology results showed a blood alcohol level of 295. She was intoxicated requiring alcohol detoxification treatments. Her most recent lab reports showed elevated liver enzyme (AST), possibly from chronic alcoholism. As a result, she received Ativan detox regimen for her alcohol detox. She also received & was discharged on, Trazodone 50 mg for insomnia, Fluoxetine 40 mg for depression & Hydroxyzine 25 mg prn for anxiety. She was enrolled & encouraged to participate in the unit group counseling sessions. She did & learned coping skills. She presented other significant pre-existing health issues that required treatment or monitoring. She was resumed on all her pertinent home medications for those health issues. She tolerated her treatment regimen without any adverse effects or reactions reported.  During the course of her hospitalization, Wyoma was evaluated on daily basis by the clinical providers to assure her response to her treatment regimen.As her treatment progressed, improvement was noted as evidenced by her report of decreasing symptoms, improved mood, affect, medication tolerance & active participation in the unit programming.She was encouraged to update her providers on her progress by daily completion of a self-inventory assessment, noting mood, mental status, pain, any new symptoms, anxiety and or concerns.  Dayton's symptomssponded well to her treatment regimen combined with a therapeutic and supportive environment. Upon discharge, Yumiko was in much improved condition than upon admission.Her symptoms were reported as significantly decreased or resolved completely. She  adamantly denies any SI/HI,  AVH, delusional thoughts & or paranoia. She was motivated to continue taking medication with a goal of continued improvement in mental health. She will continue psychiatric care on an outpatient basis as noted below. She is provided with all the necessary information required to make this appointment without problems. She received a 7 days worth, supply samples of her [facility] discharge medications. She left [facility] with all personal belongings in no apparent distress. Transportation per [employer].    Physical Findings: AIMS: Facial and Oral Movements Muscles of Facial Expression: None, normal Lips and Perioral Area: None, normal Jaw: None, normal Tongue: None, normal,Extremity Movements Upper (arms, wrists, hands, fingers): None, normal Lower (legs, knees, ankles, toes): None, normal, Trunk Movements Neck, shoulders, hips: None, normal, Overall Severity Severity of abnormal movements (highest score from questions above): None, normal Incapacitation due to abnormal movements: None, normal Patient's awareness of abnormal movements (rate only patient's report): No Awareness, Dental Status Current problems with teeth and/or dentures?: No Does patient usually wear dentures?: No  CIWA:  CIWA-Ar Total:  0 COWS:     Musculoskeletal: Strength & Muscle Tone: within normal limits Gait & Station: normal Patient leans: N/A  Psychiatric Specialty Exam: Review of Systems  Constitutional: Negative.   HENT: Negative.   Eyes: Negative.   Respiratory: Negative.   Cardiovascular: Negative.   Gastrointestinal: Negative.   Genitourinary: Negative.   Musculoskeletal: Negative.   Skin: Negative.   Neurological: Negative.   Endo/Heme/Allergies: Negative.   Psychiatric/Behavioral: Positive for depression (Stable) and substance abuse (Hx. Alcoholism, chronic). Negative for hallucinations, memory loss and suicidal ideas. The patient has insomnia (Stable). The patient is  not nervous/anxious.   All other systems reviewed and are negative.   Blood pressure 106/72, pulse 81, temperature 97.8 F (36.6 C), temperature source Oral, resp. rate 18, height 5\' 6"  (1.676 m), weight 67.1 kg (148 lb).Body mass index is 23.89 kg/m.  SEE MD PSE within the [place]   Have you used any form of tobacco in the last 30 days? (Cigarettes, Smokeless Tobacco, Cigars, and/or Pipes): No  Has this patient used any form of tobacco in the last 30 days? (Cigarettes, Smokeless Tobacco, Cigars, and/or Pipes): N/A  Metabolic Disorder Labs:  Lab Results  Component Value Date   HGBA1C 4.4 12/09/2013   MPG 80 12/09/2013   No results found for: PROLACTIN No results found for: CHOL, TRIG, HDL, CHOLHDL, VLDL, LDLCALC  See Psychiatric Specialty Exam and Suicide Risk Assessment completed by Attending Physician prior to discharge.  Discharge destination:  Home  Is patient on multiple antipsychotic therapies at discharge:  No   Has Patient had three or more failed trials of antipsychotic monotherapy by history:  No  Recommended Plan for Multiple Antipsychotic Therapies: NA  Allergies as of 10/29/2017      Reactions   Morphine And Related Anaphylaxis    Tolerated hydromorphone on 11/25/13.   Tramadol Other (See Comments)   Seizures   Penicillins Other (See Comments)   Unknown childhood reaction. Has patient had a PCN reaction causing immediate rash, facial/tongue/throat swelling, SOB or lightheadedness with hypotension: Unknown Has patient had a PCN reaction causing severe rash involving mucus membranes or skin necrosis: Unknown Has patient had a PCN reaction that required hospitalization unknown Has patient had a PCN reaction occurring within the last 10 years: No If all of the above answers are "NO", then may proceed with Cephalosporin use.      Medication List    STOP taking these medications   acetaminophen 500 MG tablet Commonly known as:  TYLENOL   norethindrone 5 MG  tablet Commonly known as:  AYGESTIN   ondansetron 4 MG disintegrating tablet Commonly known as:  ZOFRAN ODT   oxyCODONE-acetaminophen 5-325 MG tablet Commonly known as:  PERCOCET     TAKE these medications     Indication  FLUoxetine 40 MG capsule Commonly known as:  PROZAC Take 1 capsule (40 mg total) by mouth daily. For depression Start taking on:  10/30/2017  Indication:  Major Depressive Disorder   hydrOXYzine 25 MG tablet Commonly known as:  ATARAX/VISTARIL Take 1 tablet (25 mg) by mouth four times daily as needed: For anxiety  Indication:  Feeling Anxious   levETIRAcetam 1000 MG tablet Commonly known as:  KEPPRA Take 1 tablet (1,000 mg total) by mouth 2 (two) times daily. For seizure activities  Indication:  Seizure   traZODone 50 MG tablet Commonly known as:  DESYREL Take 1 tablet (50 mg total) by mouth at bedtime as needed for sleep.  Indication:  Trouble Sleeping  Follow-up Information    [facility] Follow up on 11/11/2017.   Why:  Thursday at 9:15 with Grover Canavan for your hospital follow up appointment Contact information: [place] [address] [phone]          Follow-up recommendations: Activity:  As tolerated Diet: As recommended by your primary care doctor. Keep all scheduled follow-up appointments as recommended.  Comments: Patient is instructed prior to discharge to: Take all medications as prescribed by his/her mental healthcare provider. Report any adverse effects and or reactions from the medicines to his/her outpatient provider promptly. Patient has been instructed & cautioned: To not engage in alcohol and or illegal drug use while on prescription medicines. In the event of worsening symptoms, patient is instructed to call the crisis hotline, 911 and or go to the nearest ED for appropriate evaluation and treatment of symptoms. To follow-up with his/her primary care provider for your other medical issues, concerns and or  health care needs.    Signed: Lindell Spar, PMHNP, FNP-BC 10/29/2017, 11:19 AM

## 2017-10-29 NOTE — Progress Notes (Addendum)
Pt attended morning orientation/goals group and stated that her plans remain the same to leave her ex-husband after her discharge.She believes this will help her on her road to recovery.

## 2017-10-29 NOTE — BHH Suicide Risk Assessment (Signed)
[facility] Discharge Suicide Risk Assessment   Principal Problem: MDD (major depressive disorder), recurrent severe, without psychosis ([place]) Discharge Diagnoses:  Patient Active Problem List   Diagnosis Date Noted  . MDD (major depressive disorder), recurrent severe, without psychosis ([place]) [F33.2] 10/26/2017  . UTI (urinary tract infection) [N39.0] 09/20/2017  . MDD (major depressive disorder) [F32.9] 01/16/2017  . Alcohol dependence with withdrawal, uncomplicated ([place]) [O53.664] 03/17/2015  . Hepatic encephalopathy ([place]) [K72.90] 02/23/2015  . Seizure ([place]) [R56.9] 02/23/2015  . Pancytopenia ([place]) [Q03.474] 02/23/2015  . Internal hemorrhoids [K64.8] 02/23/2015  . Alcohol intoxication ([place]) [F10.929]   . Hemorrhoid [K64.9]   . C. difficile colitis [A04.72]   . Alcohol abuse [F10.10]   . Elevated liver enzymes [R74.8]   . Colitis [K52.9] 12/12/2014  . Abrasion of wrist [S60.819A]   . Alcohol dependence with uncomplicated withdrawal ([name]) [F10.230]   . GAD (generalized anxiety disorder) [F41.1] 07/01/2014  . Alcohol withdrawal syndrome without complication ([place]) [Q59.563] 06/30/2014  . MDD (major depressive disorder), recurrent episode, moderate ([place]) [F33.1]   . Alcohol use disorder, severe, dependence ([place]) [F10.20]   . Alcohol dependence with withdrawal with complication ([place]) [O75.643] 05/19/2014  . Thrombocytopenia ([place]) [D69.6] 05/07/2014  . Hypokalemia [E87.6] 05/07/2014  . Right sided weakness [R53.1] 04/15/2014  . Seizures ([place]) [R56.9] 04/04/2014  . Substance induced mood disorder ([place]) [F19.94] 03/26/2014  . Status post myocardial infarction [I25.2] 03/06/2014  . Depression [F32.9] 03/06/2014  . Substance abuse ([place]) [F19.10] 02/22/2014  . Acute respiratory failure with hypoxia ([place]) [J96.01] 02/22/2014  . Ventricular fibrillation ([place]) [I49.01] 02/22/2014  . Acute systolic heart failure - s/p VF Cardiac Arrest [I50.21] 02/22/2014  . Acute encephalopathy [G93.40] 02/22/2014   . Acute confusional state [F05] 02/22/2014  . Moderate malnutrition ([place]) [E44.0] 02/21/2014  . Convulsions/seizures ([place]) [R56.9] 02/14/2014  . Cardiac arrest ([place]) [I46.9] 02/13/2014  . Alcohol dependence ([place]) [F10.20] 01/06/2014  . Severe alcohol use disorder ([place]) [F10.20] 01/06/2014  . Adjustment disorder with depressed mood [F43.21] 12/14/2013  . 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 Folate deficiency [E53.8] 12/11/2013  . Alcoholism ([place]) [F10.20] 12/11/2013  . Alcohol withdrawal ([place]) [F10.239] 12/11/2013  . Hypokalemia [E87.6] 12/11/2013  . Malnutrition of moderate degree ([place]) [E44.0] 12/10/2013  . Abdominal pain [R10.9] 11/25/2013  . Mallory-Weiss tear [K22.6] 11/25/2013  . Acute blood loss anemia [D62] 11/24/2013  . Hematemesis [K92.0] 11/23/2013  . Fatty liver [K76.0] 10/29/2013  . Internal hemorrhoids with other complication [P29.5] 18/84/1660  . Hematochezia [K92.1] 10/28/2013  . Normocytic anemia [D64.9] 10/28/2013  . GIB (gastrointestinal bleeding) [K92.2] 10/28/2013  . Anxiety [F41.9]   . Post-operative state [Z98.890] 09/29/2013  . Postoperative state [Z98.890] 09/28/2013  . Female pelvic pain [R10.2] 09/21/2013  . Menorrhagia [N92.0] 09/21/2013  . History of ovarian cyst [Z87.42] 09/21/2013  . 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 Dysmenorrhea [N94.6] 09/21/2013    Total Time spent with patient: 30 minutes  Musculoskeletal: Strength & Muscle Tone: within normal limits Gait & Station: normal Patient leans: N/A  Psychiatric Specialty Exam: Review of Systems  All other systems reviewed and are negative.   Blood pressure 106/72, pulse 81, temperature 97.8 F (36.6 C), temperature source Oral, resp. rate 18, height 5\' 6"  (1.676 m), weight 67.1 kg (148 lb).Body mass index is 23.89 kg/m.  General Appearance: Casual  Eye Contact::  Good  Speech:  Clear and [id number]  Volume:  Normal  Mood:  Euthymic  Affect:  Congruent  Thought  Process:  Coherent  Orientation:  Full (Time, Place,  and Person)  Thought Content:  Logical  Suicidal Thoughts:  No  Homicidal Thoughts:  No  Memory:  Immediate;   Good  Judgement:  Intact  Insight:  Fair  Psychomotor Activity:  Normal  Concentration:  Good  Recall:  Good  Fund of Knowledge:Good  Language: Good  Akathisia:  No  Handed:  Right  AIMS (if indicated):     Assets:  Communication Skills Desire for Improvement Physical Health Resilience  Sleep:  Number of Hours: 6.75  Cognition: WNL  ADL's:  Intact   Mental Status Per Nursing Assessment::   On Admission:  Suicidal ideation indicated by patient  Demographic Factors:  Divorced or widowed, Caucasian and Low socioeconomic status  Loss Factors: NA  Historical Factors: Impulsivity  Risk Reduction Factors:   Sense of responsibility to family, Employed, Living with another person, especially a relative and Positive social support  Continued Clinical Symptoms:  Alcohol/Substance Abuse/Dependencies  Cognitive Features That Contribute To Risk:  None    Suicide Risk:  Minimal: No identifiable suicidal ideation.  Patients presenting with no risk factors but with morbid ruminations; may be classified as minimal risk based on the severity of the depressive symptoms  Follow-up Information    [facility] Follow up on 11/11/2017.   Why:  Thursday at 9:15 with Grover Canavan for your hospital follow up appointment Contact information: [address] [place] [address] [phone]           Plan Of Care/Follow-up recommendations:  Activity:  ad lib  Sharma Covert, MD 10/29/2017, 7:48 AM

## 2017-10-29 NOTE — Progress Notes (Signed)
D: Pt A & O X4. Denies SI, HI, AVH and pain. Presents animated and pleasant on interactions. Endorsed anxiety 3/10, depression 7/10 when assessed. Per pt "it's just so hard when you love someone and loses them like that, but I know he will not like to see me like this or in here" relating to fiancee's death. Visible in milieu, engaged appropriately with staff and peers. Pt D/C home as ordered. Bus pass given to pt for transportation. A: Emotional support and encouragement provided to pt. D/C instructions reviewed with pt including follow up appointment, prescriptions and medication samples, compliance encouraged. Scheduled and PRN medications given as ordered with verbal education and effects monitored. All belongings from locker 37 returned to pt at time of d/c. Safety checks maintained till d/c without incident.  R: Pt receptive to care. Compliant with medications when offered. Denies adverse drug reactions at this time. Verbalized understanding related to d/c instructions. Signed belonging sheet in agreement with items received from locker at time of d/c. Ambulatory with a steady gait. Appears to be in no physical distress at time of departure from facility.

## 2017-10-29 NOTE — Progress Notes (Signed)
  [facility] Adult Case Management Discharge Plan :  Will you be returning to the same living situation after discharge:  Yes,  patient returns to return to her apartment at discharge At discharge, do you have transportation home?: Yes,  patient reports she is taking an [place] home at discharge Do you have the ability to pay for your medications: No.  Release of information consent forms completed and in the chart;  Patient's signature needed at discharge.  Patient to Follow up at: Follow-up Information    [facility] Follow up on 11/11/2017.   Why:  Thursday at 9:15 with Grover Canavan for your hospital follow up appointment Contact information: [place] [address] [phone]           Next level of care provider has access to [place] and Suicide Prevention discussed: Yes,  with the patient's friend/neighbor, Su [place]  Have you used any form of tobacco in the last 30 days? (Cigarettes, Smokeless Tobacco, Cigars, and/or Pipes): No  Has patient been referred to the Quitline?: N/A patient is not a smoker  Patient has been referred for addiction treatment: Pt. refused referral  Marylee [place]as, [place] 10/29/2017, 10:44 AM

## 2017-10-29 NOTE — Progress Notes (Signed)
Recreation Therapy Notes  Date: 4.12.19 Time: 9:30 a.m. Location: [address] Dayroom   Group Topic: Stress Management   Goal Area(s) Addresses:  Goal 1.1: To reduce stress  -Patient will feel a reduction in stress level  -Patient will understand the importance of stress management  -Patient will participate during stress management group   Behavioral Response: Engaged   Intervention: Stress Management   Activity: Meditation- Patients were in a peaceful environment with soft lighting enhancing patients mood. Patients listened to a body scan meditation on the calm app to help decrease tension and stress levels   Education: Stress Management, Discharge Planning.    Education Outcome: Acknowledges edcuation/In group clarification offered/Needs additional education   Clinical Observations/Feedback:: Patient attended and participated appropriately during stress management group treatment.    Ranell Patrick, Recreation Therapy Intern   Ranell Patrick 10/29/2017 8:39 AM

## 2017-11-09 ENCOUNTER — Emergency Department (HOSPITAL_COMMUNITY)
Admission: EM | Admit: 2017-11-09 | Discharge: 2017-11-09 | Discharge status: Against Medical Advice | Payer: Self-pay | Attending: Emergency Medicine | Admitting: Emergency Medicine

## 2017-11-09 ENCOUNTER — Emergency Department (HOSPITAL_COMMUNITY): Payer: Self-pay

## 2017-11-09 ENCOUNTER — Ambulatory Visit (HOSPITAL_COMMUNITY): Admission: RE | Admit: 2017-11-09 | Payer: Self-pay | Source: Ambulatory Visit

## 2017-11-09 DIAGNOSIS — T148XXA Other injury of unspecified body region, initial encounter: Secondary | ICD-10-CM | POA: Insufficient documentation

## 2017-11-09 DIAGNOSIS — Y999 Unspecified external cause status: Secondary | ICD-10-CM | POA: Insufficient documentation

## 2017-11-09 DIAGNOSIS — Y939 Activity, unspecified: Secondary | ICD-10-CM | POA: Insufficient documentation

## 2017-11-09 DIAGNOSIS — X58XXXA Exposure to other specified factors, initial encounter: Secondary | ICD-10-CM | POA: Insufficient documentation

## 2017-11-09 DIAGNOSIS — D649 Anemia, unspecified: Secondary | ICD-10-CM | POA: Insufficient documentation

## 2017-11-09 DIAGNOSIS — Y929 Unspecified place or not applicable: Secondary | ICD-10-CM | POA: Insufficient documentation

## 2017-11-09 DIAGNOSIS — R569 Unspecified convulsions: Secondary | ICD-10-CM

## 2017-11-09 DIAGNOSIS — Z79899 Other long term (current) drug therapy: Secondary | ICD-10-CM | POA: Insufficient documentation

## 2017-11-09 DIAGNOSIS — F10230 Alcohol dependence with withdrawal, uncomplicated: Secondary | ICD-10-CM | POA: Insufficient documentation

## 2017-11-09 LAB — CBG MONITORING, ED: GLUCOSE-CAPILLARY: 95 mg/dL (ref 65–99)

## 2017-11-09 LAB — BASIC METABOLIC PANEL
ANION GAP: 13 (ref 5–15)
BUN: 6 mg/dL (ref 6–20)
CALCIUM: 8.8 mg/dL — AB (ref 8.9–10.3)
CO2: 21 mmol/L — ABNORMAL LOW (ref 22–32)
CREATININE: 0.52 mg/dL (ref 0.44–1.00)
Chloride: 109 mmol/L (ref 101–111)
Glucose, Bld: 96 mg/dL (ref 65–99)
Potassium: 3.4 mmol/L — ABNORMAL LOW (ref 3.5–5.1)
SODIUM: 143 mmol/L (ref 135–145)

## 2017-11-09 LAB — MAGNESIUM: Magnesium: 1.5 mg/dL — ABNORMAL LOW (ref 1.7–2.4)

## 2017-11-09 LAB — CBC WITH DIFFERENTIAL/PLATELET
BASOS ABS: 0.1 10*3/uL (ref 0.0–0.1)
Basophils Relative: 1 %
EOS ABS: 0.1 10*3/uL (ref 0.0–0.7)
Eosinophils Relative: 1 %
HCT: 26 % — ABNORMAL LOW (ref 36.0–46.0)
HEMOGLOBIN: 7.6 g/dL — AB (ref 12.0–15.0)
LYMPHS PCT: 22 %
Lymphs Abs: 1.5 10*3/uL (ref 0.7–4.0)
MCH: 22.2 pg — AB (ref 26.0–34.0)
MCHC: 29.2 g/dL — ABNORMAL LOW (ref 30.0–36.0)
MCV: 75.8 fL — ABNORMAL LOW (ref 78.0–100.0)
MONO ABS: 0.5 10*3/uL (ref 0.1–1.0)
Monocytes Relative: 7 %
NEUTROS PCT: 69 %
Neutro Abs: 4.5 10*3/uL (ref 1.7–7.7)
PLATELETS: 205 10*3/uL (ref 150–400)
RBC: 3.43 MIL/uL — ABNORMAL LOW (ref 3.87–5.11)
RDW: 22.2 % — ABNORMAL HIGH (ref 11.5–15.5)
WBC: 6.7 10*3/uL (ref 4.0–10.5)

## 2017-11-09 LAB — I-STAT BETA HCG BLOOD, ED (MC, WL, AP ONLY): HCG, QUANTITATIVE: 7.2 m[IU]/mL — AB (ref <5)

## 2017-11-09 MED ORDER — IBUPROFEN 800 MG PO TABS
800.0000 mg | ORAL_TABLET | Freq: Once | ORAL | Status: AC
  Administered 2017-11-09: 800 mg via ORAL
  Filled 2017-11-09: qty 1

## 2017-11-09 MED ORDER — MAGNESIUM OXIDE 400 (241.3 MG) MG PO TABS
800.0000 mg | ORAL_TABLET | Freq: Once | ORAL | Status: AC
  Administered 2017-11-09: 800 mg via ORAL
  Filled 2017-11-09: qty 2

## 2017-11-09 MED ORDER — VITAMIN B-1 100 MG PO TABS
500.0000 mg | ORAL_TABLET | Freq: Once | ORAL | Status: AC
  Administered 2017-11-09: 500 mg via ORAL
  Filled 2017-11-09: qty 5

## 2017-11-09 NOTE — ED Notes (Addendum)
Pt refused to have vitals done before leaving/discharged.. RN notified.

## 2017-11-09 NOTE — Discharge Instructions (Signed)
Get help right away if: You have an irregular heartbeat. You have chest pain. You have trouble breathing. You have symptoms of severe withdrawal, such as: A fever. Seizures. Severe confusion. Hallucinations.

## 2017-11-09 NOTE — ED Provider Notes (Signed)
[facility] DEPT Provider Note   CSN: [id number] Arrival date & time: 11/09/17  1739     History   Chief Complaint Chief Complaint  Patient presents with  . Seizures    HPI Kristen Roberson is a 42 y.o. female WHO presents for evaluation of injury and seizure.  Patient is well-known to this emergency department.  She has a past medical history of severe alcohol dependence.  Patient states that she has been trying to quit alcohol abuse and has not had alcohol over the past 3 days.  She was at the pool today when she had a seizure.  She complains of pain in her right knee and ankle.  She has not had any repeat seizures, tremulousness since that time.  She has been taking her Keppra as directed.  She denies hallucinations.  She denies fevers, chills, injury to the tongue, incontinence.  HPI  Past Medical History:  Diagnosis Date  . Alcohol abuse   . Anemia   . Anxiety   . Blood transfusion without reported diagnosis   . Cardiac arrest ([place])   . Cysts of both ovaries   . Depression   . Fatty liver 10/05/13  . Proctitis   . Seizures [facility])     Patient Active Problem List   Diagnosis Date Noted  . 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 UTI (urinary tract infection) 09/20/2017  . MDD (major depressive disorder) 01/16/2017  . Alcohol dependence with withdrawal, uncomplicated ([place]) 13/02/6577  . Hepatic encephalopathy ([place]) 02/23/2015  . Seizure ([place]) 02/23/2015  . Pancytopenia ([place]) 02/23/2015  . Internal hemorrhoids 02/23/2015  . Alcohol intoxication ([place])   . Hemorrhoid   . C. difficile colitis   . Alcohol abuse   . Elevated liver enzymes   . Colitis 12/12/2014  . Abrasion of wrist   . Alcohol dependence with uncomplicated withdrawal ([place])   . GAD (generalized anxiety disorder) 07/01/2014  . Alcohol withdrawal syndrome without complication ([place]) 46/96/2952  . MDD (major depressive disorder),  recurrent episode, moderate ([place])   . Alcohol use disorder, severe, dependence ([place])   . Alcohol dependence with withdrawal with complication ([place]) 84/13/2440  . Thrombocytopenia ([place]) 05/07/2014  . Hypokalemia 05/07/2014  . Right sided weakness 04/15/2014  . Seizures ([place]) 04/04/2014  . Substance induced mood disorder ([place]) 03/26/2014  . Status post myocardial infarction 03/06/2014  . Depression 03/06/2014  . Substance abuse ([place]) 02/22/2014  . Acute respiratory failure with hypoxia ([place]) 02/22/2014  . Ventricular fibrillation ([place]) 02/22/2014  . Acute systolic heart failure - s/p VF Cardiac Arrest 02/22/2014  . Acute encephalopathy 02/22/2014  . Acute confusional state 02/22/2014  . Moderate malnutrition ([place]) 02/21/2014  . Convulsions/seizures ([place]) 02/14/2014  . Cardiac arrest ([place]) 02/13/2014  . Alcohol dependence ([place]) 01/06/2014  . Severe alcohol use disorder ([place]) 01/06/2014  . Adjustment disorder with depressed mood 12/14/2013  . 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 Folate deficiency 12/11/2013  . Alcoholism ([place]) 12/11/2013  . Alcohol withdrawal ([place]) 12/11/2013  . Hypokalemia 12/11/2013  . Malnutrition of moderate degree ([place]) 12/10/2013  . Abdominal pain 11/25/2013  . Mallory-Weiss tear 11/25/2013  . Acute blood loss anemia 11/24/2013  . Hematemesis 11/23/2013  . Fatty liver 10/29/2013  . Internal hemorrhoids with other complication 05/16/2535  . Hematochezia 10/28/2013  . Normocytic anemia 10/28/2013  . GIB (gastrointestinal bleeding) 10/28/2013  . Anxiety   . Post-operative state 09/29/2013  . Postoperative state 09/28/2013  . Female  pelvic pain 09/21/2013  . Menorrhagia 09/21/2013  . History of ovarian cyst 09/21/2013  . Proctitis 09/21/2013  . Dysmenorrhea 09/21/2013    Past Surgical History:  Procedure Laterality Date  . APPENDECTOMY    . colitis    . COLONOSCOPY N/A 09/30/2013   Procedure: COLONOSCOPY;  Surgeon: Lafayette Dragon, MD;   Location: [facility];  Service: Endoscopy;  Laterality: N/A;  . ESOPHAGOGASTRODUODENOSCOPY N/A 11/23/2013   Procedure: ESOPHAGOGASTRODUODENOSCOPY (EGD);  Surgeon: Jerene Bears, MD;  Location: [facility];  Service: Endoscopy;  Laterality: N/A;  . LAPAROSCOPIC APPENDECTOMY Right 09/28/2013   Procedure: APPENDECTOMY LAPAROSCOPIC;  Surgeon: Terrance Mass, MD;  Location: [facility];  Service: Gynecology;  Laterality: Right;  . LAPAROSCOPY N/A 09/28/2013   Procedure: LAPAROSCOPY OPERATIVE;  Surgeon: Terrance Mass, MD;  Location: [facility];  Service: Gynecology;  Laterality: N/A;  . LEFT AND RIGHT HEART CATHETERIZATION WITH CORONARY ANGIOGRAM N/A 02/23/2014   Procedure: LEFT AND RIGHT HEART CATHETERIZATION WITH CORONARY ANGIOGRAM;  Surgeon: Leonie Man, MD;  Location: [facility] CATH LAB;  Service: Cardiovascular;  Laterality: N/A;  . OVARIAN CYST REMOVAL    . SALPINGOOPHORECTOMY Right 09/28/2013   Procedure: SALPINGO OOPHORECTOMY;  Surgeon: Terrance Mass, MD;  Location: [facility];  Service: Gynecology;  Laterality: Right;     OB History    Gravida  7   Para  3   Term      Preterm      AB  4   Living  3     SAB  4   TAB      Ectopic      Multiple      Live Births               Home Medications    Prior to Admission medications   Medication Sig Start Date End Date Taking? Authorizing Provider  albuterol (PROVENTIL HFA;VENTOLIN HFA) 108 (90 Base) MCG/ACT inhaler Inhale 2 puffs into the lungs every 6 (six) hours as needed for wheezing or shortness of breath.   Yes [provider]  FLUoxetine (PROZAC) 40 MG capsule Take 1 capsule (40 mg total) by mouth daily. For depression 10/30/17  Yes Nwoko, Herbert Pun I, NP  ibuprofen (ADVIL,MOTRIN) 200 MG tablet Take 200 mg by mouth daily as needed.   Yes [provider]  levETIRAcetam (KEPPRA) 1000 MG tablet Take 1 tablet (1,000 mg total) by mouth 2 (two) times daily. For seizure activities 10/29/17  Yes Lindell Spar I, NP  traZODone  (DESYREL) 50 MG tablet Take 1 tablet (50 mg total) by mouth at bedtime as needed for sleep. 10/29/17  Yes Lindell Spar I, NP  hydrOXYzine (ATARAX/VISTARIL) 25 MG tablet Take 1 tablet (25 mg) by mouth four times daily as needed: For anxiety Patient not taking: Reported on 11/09/2017 10/29/17   Encarnacion Slates, NP    Family History Family History  Problem Relation Age of Onset  . Diabetes Mother   . Hyperlipidemia Mother   . Stroke Mother   . Diabetes Father     Social History Social History   Tobacco Use  . Smoking status: Never Smoker  . Smokeless tobacco: Never Used  Substance Use Topics  . Alcohol use: Yes    Comment: States she does not drink anymore  . Drug use: No     Allergies   Morphine and related; Tramadol; and Penicillins   Review of Systems Review of Systems  Ten systems reviewed and are negative for acute change,  except as noted in the HPI.   Physical Exam Updated Vital Signs BP 101/64   Pulse 85   Temp 98.5 F (36.9 C) (Oral)   Resp 15   SpO2 100%   Physical Exam  Constitutional: She is oriented to person, place, and time. She appears well-developed and well-nourished. No distress.  HENT:  Head: Normocephalic and atraumatic.  Eyes: Pupils are equal, round, and reactive to light. Conjunctivae and EOM are normal. No scleral icterus.  Neck: Normal range of motion.  Cardiovascular: Normal rate, regular rhythm and normal heart sounds. Exam reveals no gallop and no friction rub.  No murmur heard. Pulmonary/Chest: Effort normal and breath sounds normal. No respiratory distress.  Abdominal: Soft. Bowel sounds are normal. She exhibits no distension and no mass. There is no tenderness. There is no guarding.  Neurological: She is alert and oriented to person, place, and time.  Appears mildly intoxicated  Skin: Skin is warm and dry. She is not diaphoretic.  Psychiatric: Her behavior is normal.  Nursing note and vitals reviewed.    ED Treatments / Results    Labs (all labs ordered are listed, but only abnormal results are displayed) Labs Reviewed  BASIC METABOLIC PANEL - Abnormal; Notable for the following components:      Result Value   Potassium 3.4 (*)    CO2 21 (*)    Calcium 8.8 (*)    All other components within normal limits  CBC WITH DIFFERENTIAL/PLATELET - Abnormal; Notable for the following components:   RBC 3.43 (*)    Hemoglobin 7.6 (*)    HCT 26.0 (*)    MCV 75.8 (*)    MCH 22.2 (*)    MCHC 29.2 (*)    RDW 22.2 (*)    All other components within normal limits  MAGNESIUM - Abnormal; Notable for the following components:   Magnesium 1.5 (*)    All other components within normal limits  I-STAT BETA HCG BLOOD, ED ([facility], [facility], [facility] ONLY) - Abnormal; Notable for the following components:   I-stat hCG, quantitative 7.2 (*)    All other components within normal limits  PROTIME-INR  HEPATIC FUNCTION PANEL  CBG MONITORING, ED    EKG EKG Interpretation  Date/Time:  Tuesday November 09 2017 18:36:49 EDT Ventricular Rate:  80 PR Interval:  154 QRS Duration: 88 QT Interval:  426 QTC Calculation: 491 R Axis:   63 Text Interpretation:  Normal sinus rhythm Cannot rule out Anterior infarct , age undetermined Abnormal ECG No significant change since last tracing Confirmed by Duffy Bruce [id number]) on 11/09/2017 7:29:13 PM   Radiology Dg Ankle Complete Right  Result Date: 11/09/2017 CLINICAL DATA:  Witnessed seizure activity with bruising of the right ankle EXAM: RIGHT ANKLE - COMPLETE 3+ VIEW COMPARISON:  None. FINDINGS: There is no evidence of fracture, dislocation, or joint effusion. Degenerative change across the talonavicular joint. Tiny plantar enthesophyte. Mild soft tissue swelling over the lateral malleolus. No ankle joint effusion. IMPRESSION: Mild soft tissue swelling over the lateral aspect of the ankle. No acute fracture nor joint dislocation. Electronically Signed   By: Ashley Royalty M.D.   On: 11/09/2017 19:28   Dg Knee  Complete 4 Views Right  Result Date: 11/09/2017 CLINICAL DATA:  Witnessed seizure activity. Swelling of the right knee. EXAM: RIGHT KNEE - COMPLETE 4+ VIEW COMPARISON:  04/05/2017 FINDINGS: No evidence of acute fracture, joint dislocation or effusion. Mild anterior infrapatellar soft tissue swelling/contusion. Needle-like radiopaque foreign bodies are redemonstrated along the lateral  aspect of the right knee, the largest measuring approximately 1.9 cm in length and a slightly more caudal and lateral foreign body measuring 0.8 cm. IMPRESSION: Infrapatellar soft tissue contusion. No acute osseous abnormality of the right knee. Redemonstration of bilateral subcutaneous needle-like foreign bodies measuring 1.9 and 0.8 cm in length. Electronically Signed   By: Ashley Royalty M.D.   On: 11/09/2017 19:26    Procedures Procedures (including critical care time)  Medications Ordered in ED Medications  ibuprofen (ADVIL,MOTRIN) tablet 800 mg (800 mg Oral Given 11/09/17 1843)  magnesium oxide (MAG-OX) tablet 800 mg (800 mg Oral Given 11/09/17 2104)  thiamine (VITAMIN B-1) tablet 500 mg (500 mg Oral Given 11/09/17 2104)     Initial Impression / Assessment and Plan / ED Course  I have reviewed the triage vital signs and the nursing notes.  Pertinent labs & imaging results that were available during my care of the patient were reviewed by me and considered in my medical decision making (see chart for details).  Clinical Course as of Nov 10 2254  Tue Nov 09, 2017  1929 Hemoglobin(!): 7.6 [AH]  1929 Magnesium(!): 1.5 [AH]  1934 Hemoglobin(!): 7.6 [AH]  1939 Magnesium(!): 1.5 [AH]    Clinical Course User Index [AH] Margarita Mail, PA-C    Patient with chronic alcohol dependence and abuse.  Her alcohol level is elevated here in the ED.  Of significance the patient has hypomagnesemia which was repleted orally here and her hemoglobin has dropped about 1 g.  Patient refused any further workup with hepatic  function panel and PT/INR.  She also refused head CT to rule out subdural hematoma.  Patient became upset because she could not get any stronger pain medication and Motrin.  I gave her this begrudgingly secondary to her low hemoglobin and history of bleeding ulcers.  Believe a lot of her histrionic behavior was secondary to her alcohol intoxication however she was ambulatory, could answer questions appropriately.  Patient has decided to leave AMA.  Her wounds were bandaged she is invited to return at any point for further evaluation. Date: 11/09/2017 Patient: Kristen Roberson Admitted: 11/09/2017  5:43 PM Attending Provider: No att. providers found  Acquanetta Belling or her authorized caregiver has made the decision for the patient to leave the emergency department against the advice of Margarita Mail, PA-C.  She or her authorized caregiver has been informed and understands the inherent risks, including death.  She or her authorized caregiver has decided to accept the responsibility for this decision. Acquanetta Belling and all necessary parties have been advised that she may return for further evaluation or treatment. Her condition at time of discharge was Stable.  Acquanetta Belling had current vital signs as follows:  Blood pressure 101/64, pulse 85, temperature 98.5 F (36.9 C), temperature source Oral, resp. rate 15, SpO2 100 %.   Acquanetta Belling or her authorized caregiver has signed the Leaving Against Medical Advice form prior to leaving the department.  Margarita Mail 11/09/2017     Final Clinical Impressions(s) / ED Diagnoses   Final diagnoses:  Anemia, unspecified type  Abrasion  Hypomagnesemia  Alcohol withdrawal seizure without complication [facility])    ED Discharge Orders    None       Margarita Mail, PA-C 11/09/17 2256    Duffy Bruce, MD 11/11/17 0006

## 2017-11-09 NOTE — ED Notes (Signed)
ED Provider at bedside. 

## 2017-11-09 NOTE — ED Notes (Signed)
Patient refused any additional labs. Pt stated she wanted to leave AMA, provider notified. Pt given ace wrap, pt also refused additional VS.

## 2017-11-09 NOTE — ED Notes (Signed)
Patient transported to X-ray 

## 2017-11-09 NOTE — ED Triage Notes (Signed)
Transported by [facility] from home--witnessed "seizure" activity at the pool. Patient was picked up in her apartment, was able to walk back to her apartment after seizure. +ETOH on board (per EMS) Mild swelling to right knee as well.

## 2017-11-09 NOTE — ED Notes (Signed)
Bed:  [facility] Expected date:  Expected time:  Means of arrival:  Comments: EMS-fall/SZ

## 2018-01-03 ENCOUNTER — Emergency Department (HOSPITAL_COMMUNITY): Payer: Self-pay

## 2018-01-03 ENCOUNTER — Encounter (HOSPITAL_COMMUNITY): Payer: Self-pay | Admitting: *Deleted

## 2018-01-03 ENCOUNTER — Inpatient Hospital Stay (HOSPITAL_COMMUNITY)
Admission: EM | Admit: 2018-01-03 | Discharge: 2018-01-08 | DRG: 378 | Disposition: A | Discharge status: Other Acute Inpatient Hospital | Payer: Self-pay | Attending: Internal Medicine | Admitting: Internal Medicine

## 2018-01-03 DIAGNOSIS — D125 Benign neoplasm of sigmoid colon: Secondary | ICD-10-CM | POA: Diagnosis present

## 2018-01-03 DIAGNOSIS — D649 Anemia, unspecified: Secondary | ICD-10-CM

## 2018-01-03 DIAGNOSIS — Z79899 Other long term (current) drug therapy: Secondary | ICD-10-CM

## 2018-01-03 DIAGNOSIS — K644 Residual hemorrhoidal skin tags: Secondary | ICD-10-CM | POA: Diagnosis present

## 2018-01-03 DIAGNOSIS — R45851 Suicidal ideations: Secondary | ICD-10-CM | POA: Diagnosis present

## 2018-01-03 DIAGNOSIS — F1910 Other psychoactive substance abuse, uncomplicated: Secondary | ICD-10-CM | POA: Diagnosis present

## 2018-01-03 DIAGNOSIS — F1010 Alcohol abuse, uncomplicated: Secondary | ICD-10-CM | POA: Diagnosis present

## 2018-01-03 DIAGNOSIS — E876 Hypokalemia: Secondary | ICD-10-CM | POA: Diagnosis present

## 2018-01-03 DIAGNOSIS — F419 Anxiety disorder, unspecified: Secondary | ICD-10-CM | POA: Diagnosis present

## 2018-01-03 DIAGNOSIS — I4581 Long QT syndrome: Secondary | ICD-10-CM | POA: Diagnosis present

## 2018-01-03 DIAGNOSIS — K529 Noninfective gastroenteritis and colitis, unspecified: Secondary | ICD-10-CM | POA: Diagnosis present

## 2018-01-03 DIAGNOSIS — K625 Hemorrhage of anus and rectum: Principal | ICD-10-CM | POA: Diagnosis present

## 2018-01-03 DIAGNOSIS — R1084 Generalized abdominal pain: Secondary | ICD-10-CM | POA: Diagnosis present

## 2018-01-03 DIAGNOSIS — F329 Major depressive disorder, single episode, unspecified: Secondary | ICD-10-CM | POA: Diagnosis present

## 2018-01-03 DIAGNOSIS — F1410 Cocaine abuse, uncomplicated: Secondary | ICD-10-CM | POA: Diagnosis present

## 2018-01-03 DIAGNOSIS — D509 Iron deficiency anemia, unspecified: Secondary | ICD-10-CM | POA: Diagnosis present

## 2018-01-03 DIAGNOSIS — Z7951 Long term (current) use of inhaled steroids: Secondary | ICD-10-CM

## 2018-01-03 DIAGNOSIS — K56699 Other intestinal obstruction unspecified as to partial versus complete obstruction: Secondary | ICD-10-CM | POA: Diagnosis present

## 2018-01-03 DIAGNOSIS — Z8674 Personal history of sudden cardiac arrest: Secondary | ICD-10-CM

## 2018-01-03 LAB — CBC WITH DIFFERENTIAL/PLATELET
BASOS ABS: 0 10*3/uL (ref 0.0–0.1)
Basophils Relative: 0 %
EOS ABS: 0.1 10*3/uL (ref 0.0–0.7)
Eosinophils Relative: 1 %
HCT: 25.5 % — ABNORMAL LOW (ref 36.0–46.0)
HEMOGLOBIN: 7.3 g/dL — AB (ref 12.0–15.0)
LYMPHS PCT: 18 %
Lymphs Abs: 1 10*3/uL (ref 0.7–4.0)
MCH: 20.8 pg — ABNORMAL LOW (ref 26.0–34.0)
MCHC: 28.6 g/dL — AB (ref 30.0–36.0)
MCV: 72.6 fL — ABNORMAL LOW (ref 78.0–100.0)
Monocytes Absolute: 0.5 10*3/uL (ref 0.1–1.0)
Monocytes Relative: 9 %
NEUTROS ABS: 3.7 10*3/uL (ref 1.7–7.7)
NEUTROS PCT: 72 %
Platelets: 235 10*3/uL (ref 150–400)
RBC: 3.51 MIL/uL — AB (ref 3.87–5.11)
RDW: 20.6 % — AB (ref 11.5–15.5)
WBC: 5.3 10*3/uL (ref 4.0–10.5)

## 2018-01-03 LAB — URINALYSIS, ROUTINE W REFLEX MICROSCOPIC
Bacteria, UA: NONE SEEN
Bilirubin Urine: NEGATIVE
GLUCOSE, UA: NEGATIVE mg/dL
KETONES UR: 5 mg/dL — AB
Leukocytes, UA: NEGATIVE
NITRITE: NEGATIVE
PROTEIN: 100 mg/dL — AB
Specific Gravity, Urine: 1.012 (ref 1.005–1.030)
pH: 6 (ref 5.0–8.0)

## 2018-01-03 LAB — COMPREHENSIVE METABOLIC PANEL
ALBUMIN: 4.2 g/dL (ref 3.5–5.0)
ALK PHOS: 98 U/L (ref 38–126)
ALT: 30 U/L (ref 14–54)
ANION GAP: 9 (ref 5–15)
AST: 63 U/L — AB (ref 15–41)
BUN: <5 mg/dL — ABNORMAL LOW (ref 6–20)
CO2: 23 mmol/L (ref 22–32)
Calcium: 8.4 mg/dL — ABNORMAL LOW (ref 8.9–10.3)
Chloride: 102 mmol/L (ref 101–111)
Creatinine, Ser: 0.47 mg/dL (ref 0.44–1.00)
GFR calc Af Amer: >60 mL/min (ref >60)
GFR calc non Af Amer: >60 mL/min (ref >60)
GLUCOSE: 105 mg/dL — AB (ref 65–99)
POTASSIUM: 3.2 mmol/L — AB (ref 3.5–5.1)
SODIUM: 134 mmol/L — AB (ref 135–145)
Total Bilirubin: 1.3 mg/dL — ABNORMAL HIGH (ref 0.3–1.2)
Total Protein: 7.7 g/dL (ref 6.5–8.1)

## 2018-01-03 LAB — RAPID URINE DRUG SCREEN, HOSP PERFORMED
Amphetamines: NOT DETECTED
Benzodiazepines: NOT DETECTED
Cocaine: POSITIVE — AB
Opiates: NOT DETECTED
TETRAHYDROCANNABINOL: NOT DETECTED

## 2018-01-03 LAB — POC URINE PREG, ED: PREG TEST UR: NEGATIVE

## 2018-01-03 LAB — ETHANOL

## 2018-01-03 LAB — I-STAT CG4 LACTIC ACID, ED
LACTIC ACID, VENOUS: 1.4 mmol/L (ref 0.5–1.9)
Lactic Acid, Venous: 1.88 mmol/L (ref 0.5–1.9)

## 2018-01-03 LAB — LIPASE, BLOOD: Lipase: 23 U/L (ref 11–51)

## 2018-01-03 LAB — MAGNESIUM: Magnesium: 1.5 mg/dL — ABNORMAL LOW (ref 1.7–2.4)

## 2018-01-03 LAB — IRON AND TIBC
Iron: 17 ug/dL — ABNORMAL LOW (ref 28–170)
SATURATION RATIOS: 4 % — AB (ref 10.4–31.8)
TIBC: 418 ug/dL (ref 250–450)
UIBC: 401 ug/dL

## 2018-01-03 MED ORDER — ACETAMINOPHEN 325 MG PO TABS
650.0000 mg | ORAL_TABLET | Freq: Four times a day (QID) | ORAL | Status: DC | PRN
  Administered 2018-01-05 – 2018-01-07 (×7): 650 mg via ORAL
  Filled 2018-01-03 (×8): qty 2

## 2018-01-03 MED ORDER — MAGNESIUM SULFATE 2 GM/50ML IV SOLN
2.0000 g | Freq: Once | INTRAVENOUS | Status: AC
  Administered 2018-01-04: 2 g via INTRAVENOUS
  Filled 2018-01-03: qty 50

## 2018-01-03 MED ORDER — ONDANSETRON HCL 4 MG PO TABS: 4.0000 mg | ORAL_TABLET | Freq: Four times a day (QID) | ORAL | Status: DC | PRN

## 2018-01-03 MED ORDER — CIPROFLOXACIN HCL 500 MG PO TABS
500.0000 mg | ORAL_TABLET | Freq: Once | ORAL | Status: AC
  Administered 2018-01-03: 500 mg via ORAL
  Filled 2018-01-03: qty 1

## 2018-01-03 MED ORDER — CIPROFLOXACIN IN D5W 400 MG/200ML IV SOLN
400.0000 mg | Freq: Two times a day (BID) | INTRAVENOUS | Status: DC
  Administered 2018-01-04 (×2): 400 mg via INTRAVENOUS
  Filled 2018-01-03 (×2): qty 200

## 2018-01-03 MED ORDER — METRONIDAZOLE IN NACL 5-0.79 MG/ML-% IV SOLN
500.0000 mg | Freq: Three times a day (TID) | INTRAVENOUS | Status: DC
  Administered 2018-01-04 – 2018-01-05 (×4): 500 mg via INTRAVENOUS
  Filled 2018-01-03 (×4): qty 100

## 2018-01-03 MED ORDER — POTASSIUM CHLORIDE 10 MEQ/100ML IV SOLN
10.0000 meq | INTRAVENOUS | Status: AC
  Administered 2018-01-04 (×3): 10 meq via INTRAVENOUS
  Filled 2018-01-03 (×3): qty 100

## 2018-01-03 MED ORDER — SODIUM CHLORIDE 0.9 % IV SOLN
INTRAVENOUS | Status: DC
  Administered 2018-01-04 – 2018-01-06 (×5): via INTRAVENOUS

## 2018-01-03 MED ORDER — HYDROMORPHONE HCL 1 MG/ML IJ SOLN
1.0000 mg | Freq: Once | INTRAMUSCULAR | Status: AC
  Administered 2018-01-03: 1 mg via INTRAVENOUS
  Filled 2018-01-03: qty 1

## 2018-01-03 MED ORDER — ONDANSETRON HCL 4 MG/2ML IJ SOLN
4.0000 mg | Freq: Once | INTRAMUSCULAR | Status: DC
  Filled 2018-01-03: qty 2

## 2018-01-03 MED ORDER — ONDANSETRON HCL 4 MG/2ML IJ SOLN: 4.0000 mg | Freq: Four times a day (QID) | INTRAMUSCULAR | Status: DC | PRN

## 2018-01-03 MED ORDER — ONDANSETRON HCL 4 MG/2ML IJ SOLN
4.0000 mg | Freq: Once | INTRAMUSCULAR | Status: AC
  Administered 2018-01-03: 4 mg via INTRAVENOUS

## 2018-01-03 MED ORDER — SODIUM CHLORIDE 0.9 % IV BOLUS
1000.0000 mL | Freq: Once | INTRAVENOUS | Status: AC
  Administered 2018-01-03: 1000 mL via INTRAVENOUS

## 2018-01-03 MED ORDER — IOPAMIDOL (ISOVUE-300) INJECTION 61%
30.0000 mL | Freq: Once | INTRAVENOUS | Status: AC | PRN
  Administered 2018-01-03: 30 mL via ORAL

## 2018-01-03 MED ORDER — SODIUM CHLORIDE 0.9 % IV SOLN
Freq: Once | INTRAVENOUS | Status: AC
  Administered 2018-01-04: 01:00:00 via INTRAVENOUS

## 2018-01-03 MED ORDER — ACETAMINOPHEN 650 MG RE SUPP: 650.0000 mg | Freq: Four times a day (QID) | RECTAL | Status: DC | PRN

## 2018-01-03 MED ORDER — METRONIDAZOLE 500 MG PO TABS
500.0000 mg | ORAL_TABLET | Freq: Once | ORAL | Status: AC
  Administered 2018-01-03: 500 mg via ORAL
  Filled 2018-01-03: qty 1

## 2018-01-03 MED ORDER — ALBUTEROL SULFATE (2.5 MG/3ML) 0.083% IN NEBU: 2.5000 mg | INHALATION_SOLUTION | Freq: Four times a day (QID) | RESPIRATORY_TRACT | Status: DC | PRN

## 2018-01-03 MED ORDER — IOPAMIDOL (ISOVUE-300) INJECTION 61%
100.0000 mL | Freq: Once | INTRAVENOUS | Status: AC | PRN
  Administered 2018-01-03: 100 mL via INTRAVENOUS

## 2018-01-03 MED ORDER — FENTANYL CITRATE (PF) 100 MCG/2ML IJ SOLN
25.0000 ug | INTRAMUSCULAR | Status: DC | PRN
  Administered 2018-01-04 (×2): 25 ug via INTRAVENOUS
  Filled 2018-01-03 (×2): qty 2

## 2018-01-03 MED ORDER — IOPAMIDOL (ISOVUE-300) INJECTION 61%
INTRAVENOUS | Status: AC
  Filled 2018-01-03: qty 100

## 2018-01-03 NOTE — ED Notes (Signed)
Pt in CT.

## 2018-01-03 NOTE — ED Notes (Signed)
Pt. Belongings are in cabinet #3. Nurse aware.
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
## 2018-01-03 NOTE — ED Triage Notes (Signed)
Per EMS, complains of back pain, lower abdominal pain. Pt states she had blood in stool.   Pt also states she does not want to live. Pt does not have plan.  BP 149 palpated HR 88 CBG 96

## 2018-01-03 NOTE — ED Notes (Signed)
Tele-sitter in use

## 2018-01-04 ENCOUNTER — Other Ambulatory Visit: Payer: Self-pay

## 2018-01-04 DIAGNOSIS — D509 Iron deficiency anemia, unspecified: Secondary | ICD-10-CM | POA: Diagnosis present

## 2018-01-04 LAB — PREPARE RBC (CROSSMATCH)

## 2018-01-04 LAB — CBC
HCT: 25 % — ABNORMAL LOW (ref 36.0–46.0)
HEMATOCRIT: 31.3 % — AB (ref 36.0–46.0)
HEMOGLOBIN: 7.3 g/dL — AB (ref 12.0–15.0)
HEMOGLOBIN: 9.5 g/dL — AB (ref 12.0–15.0)
MCH: 21.9 pg — ABNORMAL LOW (ref 26.0–34.0)
MCH: 23.9 pg — AB (ref 26.0–34.0)
MCHC: 29.2 g/dL — AB (ref 30.0–36.0)
MCHC: 30.4 g/dL (ref 30.0–36.0)
MCV: 75.1 fL — ABNORMAL LOW (ref 78.0–100.0)
MCV: 78.8 fL (ref 78.0–100.0)
Platelets: 163 10*3/uL (ref 150–400)
Platelets: 133 10*3/uL — ABNORMAL LOW (ref 150–400)
RBC: 3.33 MIL/uL — AB (ref 3.87–5.11)
RBC: 3.97 MIL/uL (ref 3.87–5.11)
RDW: 20.8 % — ABNORMAL HIGH (ref 11.5–15.5)
RDW: 20.4 % — ABNORMAL HIGH (ref 11.5–15.5)
WBC: 3.5 10*3/uL — AB (ref 4.0–10.5)
WBC: 4.2 10*3/uL (ref 4.0–10.5)

## 2018-01-04 LAB — MAGNESIUM: Magnesium: 1.9 mg/dL (ref 1.7–2.4)

## 2018-01-04 LAB — C DIFFICILE QUICK SCREEN W PCR REFLEX
C Diff antigen: NEGATIVE
C Diff interpretation: NOT DETECTED
C Diff toxin: NEGATIVE

## 2018-01-04 MED ORDER — SODIUM CHLORIDE 0.9% IV SOLUTION
Freq: Once | INTRAVENOUS | Status: AC
  Administered 2018-01-04: 09:00:00 via INTRAVENOUS

## 2018-01-04 MED ORDER — POTASSIUM CHLORIDE 20 MEQ/15ML (10%) PO SOLN
40.0000 meq | Freq: Two times a day (BID) | ORAL | Status: AC
  Administered 2018-01-04 (×2): 40 meq via ORAL
  Filled 2018-01-04 (×2): qty 30

## 2018-01-04 MED ORDER — MAGNESIUM OXIDE 400 (241.3 MG) MG PO TABS
400.0000 mg | ORAL_TABLET | Freq: Two times a day (BID) | ORAL | Status: AC
  Administered 2018-01-04 (×2): 400 mg via ORAL
  Filled 2018-01-04 (×2): qty 1

## 2018-01-04 MED ORDER — LEVETIRACETAM 500 MG PO TABS
1000.0000 mg | ORAL_TABLET | Freq: Two times a day (BID) | ORAL | Status: DC
  Administered 2018-01-04 – 2018-01-08 (×9): 1000 mg via ORAL
  Filled 2018-01-04: qty 2
  Filled 2018-01-04: qty 4
  Filled 2018-01-04 (×7): qty 2

## 2018-01-04 MED ORDER — FENTANYL CITRATE (PF) 100 MCG/2ML IJ SOLN
50.0000 ug | INTRAMUSCULAR | Status: DC | PRN
  Administered 2018-01-04 – 2018-01-05 (×7): 50 ug via INTRAVENOUS
  Filled 2018-01-04 (×7): qty 2
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
## 2018-01-04 NOTE — Care Management Note (Signed)
Case Management Note  Patient Details  Name: IYLA BALZARINI MRN: [id number] Date of Birth: [birth year]  Subjective/Objective:  Per attending will continue to provide medical treatment, on 1:1 before deciding if Psych cons needed.                  Action/Plan:d/c plan home.   Expected Discharge Date:                  Expected Discharge Plan:  Home/Self Care  In-House Referral:  Clinical Social Work  Discharge planning Services  CM Consult, [facility]  Post Acute Care Choice:    Choice offered to:     DME Arranged:    DME Agency:     HH Arranged:    [place] Agency:     Status of Service:  In process, will continue to follow  If discussed at Long Length of Stay Meetings, dates discussed:    Additional Comments:  Dessa Phi, RN 01/04/2018, 4:14 PM

## 2018-01-04 NOTE — Progress Notes (Addendum)
[facility] PROGRESS NOTE    Progress Note  Kristen Roberson  XTG:[id number] DOB: 22-Jan-1976 DOA: 01/03/2018 PCP: Tresa Garter, MD     Brief Narrative:   Kristen Roberson is an 42 y.o. female past medical history of alcohol abuse, anxiety and depression who presents with complaints of abdominal pain and intermittent rectal bleeding and multiple bowel movements a day that started several days prior to admission.,  She relates the pain is severe radiating to her back area CT scan of the abdomen and pelvis showed inflammation of the sigmoid and rectum  Assessment/Plan:   Colitis with rectal bleeding: Started empirically on IV Cipro and Flagyl, fentanyl IV for pain. Of the abdomen pelvis done on 01/04/2018 show inflammatory changes of the sigmoid and rectum and several segments of small bowel narrowing. Stool studies are pending. Continue IV fluids, replete electrolytes monitor strict I's and O's. If bleeding does not subside her abdominal pain might need GI as this could be an inflammatory bowel disease.    Hypokalemia: Likely due to diarrhea, will replete orally and recheck tomorrow morning.  Hypomagnesemia: Repleted orally and recheck tomorrow morning.  Microcytic anemia: Iron studies are done shows an iron 17 saturation 4% ferritin is pending, type and screen.  Polysubstance abuse ([place]) Counseling.  Prolonged QT: 522 on 12-lead EKG likely due to electrolyte imbalance will replete aggressively twelve-lead EKG showed improving QTC.  Will repeat tomorrow morning.  Depression with suicidal ideation With a sitter at bedside, will consult psyq when medically stable.   DVT prophylaxis: scd Family Communication:none Disposition Plan/Barrier to D/C: unable to determine Code Status:     Code Status Orders  (From admission, onward)        Start     Ordered   01/03/18 2245  Full code  Continuous     01/03/18 2248    Code Status History    Date Active Date Inactive Code  Status Order ID Comments User Context   10/26/2017 1603 10/29/2017 1611 Full Code [id number]  Ethelene Hal, NP Inpatient   10/25/2017 1518 10/26/2017 1521 Full Code [id number]  Valarie Merino, MD ED   01/16/2017 1352 01/19/2017 1700 Full Code [id number]  Kerrie Buffalo, NP Inpatient   01/16/2017 1351 01/16/2017 1352 Full Code [id number]  Kerrie Buffalo, NP Inpatient   01/16/2017 0206 01/16/2017 1336 Full Code [id number]  Gareth Morgan, MD ED   07/16/2015 1504 07/20/2015 1607 Full Code [id number]  Delfin Gant, NP Inpatient   07/16/2015 0011 07/16/2015 1504 Full Code [id number]  Jeannett Senior, PA-C ED   03/16/2015 1614 03/17/2015 1435 Full Code [id number]  Noemi Chapel, MD ED   02/23/2015 2256 02/27/2015 1412 Full Code [id number]  Theressa Millard, MD ED   12/13/2014 0128 12/15/2014 1813 Full Code [id number]  Lavina Hamman, MD Inpatient   11/28/2014 1924 11/29/2014 1313 Full Code [id number]  Fransico Meadow, PA-C ED   10/30/2014 1853 10/31/2014 1108 Full Code [id number]  Davonna Belling, MD ED   06/30/2014 1929 07/04/2014 1853 Full Code [id number]  Kennedy Bucker, NP Inpatient   06/29/2014 1540 06/30/2014 1929 Full Code [id number]  Starlyn Skeans, PA-C ED   05/19/2014 2310 05/22/2014 1307 Full Code [id number]  Waylan Boga, NP Inpatient   05/18/2014 2212 05/19/2014 2310 Full Code [id number]  Pamella Pert, MD ED   05/07/2014 2343 05/10/2014 1813 Full Code [id number]  Theodis Blaze, MD ED   04/15/2014 2247 04/17/2014 1757 Full Code [id number]  Berle Mull, MD ED   03/26/2014 0021  03/26/2014 1556 Full Code [id number]  Martie Lee, PA-C ED   02/23/2014 1215 02/27/2014 2120 Full Code [id number]  Leonie Man, MD Inpatient   02/13/2014 2300 02/13/2014 2302 Full Code [id number]  Germain Osgood, PA-C ED   02/13/2014 2243 02/13/2014 2300 Full Code [id number]  Doree Fudge, MD ED   01/06/2014 1308 01/09/2014 1824 Full Code [id number]  Lurena Nida, NP Inpatient   01/06/2014 0615 01/06/2014 1308  Full Code [id number]  Mariea Clonts, MD ED   12/14/2013 0215 12/14/2013 1435 Full Code [id number]  Kalman Drape, MD ED   12/09/2013 0415 12/11/2013 1711 Full Code [id number]  Theodis Blaze, MD Inpatient   11/25/2013 1848 11/27/2013 1217 Full Code [id number]  Domenic Polite, MD Inpatient   11/23/2013 0417 11/24/2013 1858 Full Code [id number]  Berle Mull, MD Inpatient   10/29/2013 0040 10/29/2013 2214 Full Code [id number]  Phillips Grout, MD Inpatient   09/28/2013 2301 09/29/2013 1550 Full Code [id number]  Terrance Mass, MD Inpatient        IV Access:    Peripheral IV   Procedures and diagnostic studies:   Ct Abdomen Pelvis W Contrast  Result Date: 01/03/2018 CLINICAL DATA:  42 y/o F; history of colitis and proctitis presenting with severe abdominal pain, back pain, and rectal bleeding. EXAM: CT ABDOMEN AND PELVIS WITH CONTRAST TECHNIQUE: Multidetector CT imaging of the abdomen and pelvis was performed using the standard protocol following bolus administration of intravenous contrast. CONTRAST:  100 cc Isovue-300 COMPARISON:  09/20/2017 CT abdomen and pelvis FINDINGS: Lower chest: No acute abnormality. Hepatobiliary: No focal liver abnormality is seen. No gallstones, gallbladder wall thickening, or biliary dilatation. Pancreas: Unremarkable. No pancreatic ductal dilatation or surrounding inflammatory changes. Spleen: Normal in size without focal abnormality. Adrenals/Urinary Tract: Adrenal glands are unremarkable. Kidneys are normal, without renal calculi, focal lesion, or hydronephrosis. Bladder is unremarkable. Stomach/Bowel: Appendectomy. Normal appearance of the stomach and small bowel without obstruction or inflammation. There are several segments of mild wall thickening of the sigmoid colon and rectum the compatible with colitis. No evidence for perforation or abscess. Additionally, there are several segments of narrowing of the lumen of the colon involving the transverse colon, descending colon,  sigmoid colon, and rectum which may represent areas of stricture. Vascular/Lymphatic: No significant vascular findings are present. No enlarged abdominal or pelvic lymph nodes. Reproductive: Uterus and bilateral adnexa are unremarkable. Other: No abdominal wall hernia or abnormality. No abdominopelvic ascites. Musculoskeletal: L5-S1 grade 2 anterolisthesis and chronic pars defects. No acute fracture identified. IMPRESSION: 1. Segments of mild wall thickening of sigmoid colon and rectum compatible with infectious or inflammatory colitis. No evidence for perforation or abscess. 2. Several segments of colon narrowing which may represent stricture. 3. L5-S1 grade 2 anterolisthesis with chronic L5 pars defects. Electronically Signed   By: Kristine Garbe M.D.   On: 01/03/2018 21:11     Medical Consultants:    None.  Anti-Infectives:   Cipro and Flagyl  Subjective:    Kristen Roberson relates her abdominal pain is slightly improved but he had an episode of bloody bowel movement this morning.  Objective:    Vitals:   01/04/18 0218 01/04/18 0314 01/04/18 0412 01/04/18 0610  BP: 116/72 117/64 120/78 115/60  Pulse: 79 83 75 65  Resp: 18 18 16    Temp: 98 F (36.7 C) 98.4 F (36.9 C) 98.4 F (36.9 C) 98 F (36.7 C)  TempSrc: Oral Oral Oral Oral  SpO2: 97% 97% 98% 98%  Weight:      Height:        Intake/Output Summary (Last 24 hours) at 01/04/2018 0729 Last data filed at 01/04/2018 0410 Gross per 24 hour  Intake 866.34 ml  Output -  Net 866.34 ml   Filed Weights   01/03/18 2103 01/04/18 0055  Weight: 68 kg (150 lb) 68 kg (150 lb)    Exam: General exam: In no acute distress. Respiratory system: Good air movement and clear to auscultation. Cardiovascular system: S1 & S2 heard, RRR.  Gastrointestinal system: Abdomen is nondistended, soft and nontender.  Central nervous system: Alert and oriented. No focal neurological deficits. Extremities: No pedal edema. Skin: No rashes,  lesions or ulcers Psychiatry: Judgement and insight appear normal. Mood & affect appropriate.    Data Reviewed:    Labs: Basic Metabolic Panel: Recent Labs  Lab 01/03/18 1823  NA 134*  K 3.2*  CL 102  CO2 23  GLUCOSE 105*  BUN <5*  CREATININE 0.47  CALCIUM 8.4*  MG 1.5*   GFR Estimated Creatinine Clearance: 85.8 mL/min (by C-G formula based on SCr of 0.47 mg/dL). Liver Function Tests: Recent Labs  Lab 01/03/18 1823  AST 63*  ALT 30  ALKPHOS 98  BILITOT 1.3*  PROT 7.7  ALBUMIN 4.2   Recent Labs  Lab 01/03/18 1823  LIPASE 23   No results for input(s): AMMONIA in the last 168 hours. Coagulation profile No results for input(s): INR, PROTIME in the last 168 hours.  CBC: Recent Labs  Lab 01/03/18 1823  WBC 5.3  NEUTROABS 3.7  HGB 7.3*  HCT 25.5*  MCV 72.6*  PLT 235   Cardiac Enzymes: No results for input(s): CKTOTAL, CKMB, CKMBINDEX, TROPONINI in the last 168 hours. BNP (last 3 results) No results for input(s): PROBNP in the last 8760 hours. CBG: No results for input(s): GLUCAP in the last 168 hours. D-Dimer: No results for input(s): DDIMER in the last 72 hours. Hgb A1c: No results for input(s): HGBA1C in the last 72 hours. Lipid Profile: No results for input(s): CHOL, HDL, LDLCALC, TRIG, CHOLHDL, LDLDIRECT in the last 72 hours. Thyroid function studies: No results for input(s): TSH, T4TOTAL, T3FREE, THYROIDAB in the last 72 hours.  Invalid input(s): FREET3 Anemia work up: Recent Labs    01/03/18 1823  TIBC 418  IRON 17*   Sepsis Labs: Recent Labs  Lab 01/03/18 1823 01/03/18 1834 01/03/18 1957  WBC 5.3  --   --   LATICACIDVEN  --  1.88 1.40   Microbiology No results found for this or any previous visit (from the past 240 hour(s)).   Medications:   . ondansetron (ZOFRAN) IV  4 mg Intravenous Once   Continuous Infusions: . sodium chloride 75 mL/hr at 01/04/18 0100  . ciprofloxacin    . metronidazole 500 mg (01/04/18 0603)      LOS: 1 day   Charlynne Cousins  [facility] Pager [phone]  *Please refer to [email or web address], password [email or web address] to get updated schedule on who will round on this patient, as hospitalists switch teams weekly. If 7PM-7AM, please contact night-coverage at [email or web address], password [email or web address] for any overnight needs.  01/04/2018, 7:29 AM

## 2018-01-05 DIAGNOSIS — F419 Anxiety disorder, unspecified: Secondary | ICD-10-CM

## 2018-01-05 LAB — BPAM RBC
BLOOD PRODUCT EXPIRATION DATE: 201907092359
Blood Product Expiration Date: 201907072359
Blood Product Expiration Date: 201907072359
ISSUE DATE / TIME: 201906180158
ISSUE DATE / TIME: 201906180856
ISSUE DATE / TIME: 201906181206
UNIT TYPE AND RH: 5100
UNIT TYPE AND RH: 5100
Unit Type and Rh: 5100

## 2018-01-05 LAB — TYPE AND SCREEN
ABO/RH(D): O POS
Antibody Screen: NEGATIVE
UNIT DIVISION: 0
UNIT DIVISION: 0
Unit division: 0

## 2018-01-05 LAB — BASIC METABOLIC PANEL
ANION GAP: 7 (ref 5–15)
BUN: <5 mg/dL — ABNORMAL LOW (ref 6–20)
CO2: 22 mmol/L (ref 22–32)
Calcium: 7.9 mg/dL — ABNORMAL LOW (ref 8.9–10.3)
Chloride: 110 mmol/L (ref 101–111)
Creatinine, Ser: 0.42 mg/dL — ABNORMAL LOW (ref 0.44–1.00)
Glucose, Bld: 96 mg/dL (ref 65–99)
Potassium: 3.7 mmol/L (ref 3.5–5.1)
SODIUM: 139 mmol/L (ref 135–145)

## 2018-01-05 LAB — HEMOGLOBIN AND HEMATOCRIT, BLOOD
HEMATOCRIT: 32.8 % — AB (ref 36.0–46.0)
HEMOGLOBIN: 9.9 g/dL — AB (ref 12.0–15.0)

## 2018-01-05 MED ORDER — ONDANSETRON HCL 4 MG/2ML IJ SOLN
4.0000 mg | Freq: Four times a day (QID) | INTRAMUSCULAR | Status: DC | PRN
  Administered 2018-01-05 – 2018-01-07 (×2): 4 mg via INTRAVENOUS
  Filled 2018-01-05 (×2): qty 2

## 2018-01-05 MED ORDER — POTASSIUM CHLORIDE CRYS ER 20 MEQ PO TBCR
40.0000 meq | EXTENDED_RELEASE_TABLET | Freq: Once | ORAL | Status: AC
  Administered 2018-01-05: 40 meq via ORAL
  Filled 2018-01-05: qty 2

## 2018-01-05 MED ORDER — CIPROFLOXACIN HCL 500 MG PO TABS
500.0000 mg | ORAL_TABLET | Freq: Two times a day (BID) | ORAL | Status: DC
  Administered 2018-01-05 – 2018-01-07 (×6): 500 mg via ORAL
  Filled 2018-01-05 (×6): qty 1

## 2018-01-05 MED ORDER — FLUOXETINE HCL 20 MG PO CAPS
40.0000 mg | ORAL_CAPSULE | Freq: Every day | ORAL | Status: DC
  Administered 2018-01-05 – 2018-01-08 (×4): 40 mg via ORAL
  Filled 2018-01-05 (×4): qty 2

## 2018-01-05 MED ORDER — METRONIDAZOLE 500 MG PO TABS
500.0000 mg | ORAL_TABLET | Freq: Three times a day (TID) | ORAL | Status: DC
  Administered 2018-01-05 – 2018-01-07 (×9): 500 mg via ORAL
  Filled 2018-01-05 (×9): qty 1

## 2018-01-05 MED ORDER — TRAZODONE HCL 50 MG PO TABS
50.0000 mg | ORAL_TABLET | Freq: Every evening | ORAL | Status: DC | PRN
  Administered 2018-01-05 – 2018-01-07 (×3): 50 mg via ORAL
  Filled 2018-01-05 (×3): qty 1

## 2018-01-05 MED ORDER — HYDROXYZINE HCL 25 MG PO TABS
25.0000 mg | ORAL_TABLET | Freq: Three times a day (TID) | ORAL | Status: DC | PRN
  Administered 2018-01-07: 25 mg via ORAL
  Filled 2018-01-05: qty 1

## 2018-01-05 MED ORDER — HYDROCORTISONE ACETATE 25 MG RE SUPP
25.0000 mg | Freq: Two times a day (BID) | RECTAL | Status: DC
  Administered 2018-01-05 – 2018-01-06 (×4): 25 mg via RECTAL
  Filled 2018-01-05 (×8): qty 1
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
## 2018-01-05 NOTE — Progress Notes (Signed)
Report was received from the previous nurse and I agree with her assessment of the patient. I will continue to monitor the patient.
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
## 2018-01-05 NOTE — Progress Notes (Signed)
At the beginning of shift, patient was very anxious about leaving the hospital and going to a shelter. She stated, "If I leave the hospital without additional psychiatric treatment, I am going to kill myself". I listened closely and offered my empathy and sympathy for what she was going through. I also stated I was going to speak with the day RN and leave a note in the computer.  She seemed more at ease and less anxious after our discussion.

## 2018-01-05 NOTE — Progress Notes (Signed)
[facility] PROGRESS NOTE    Progress Note  Kristen Roberson  ZYS:[id number] DOB: Jan 08, 1976 DOA: 01/03/2018 PCP: Tresa Garter, MD     Brief Narrative:   Kristen Roberson is an 42 y.o. female past medical history of alcohol abuse, anxiety and depression who presents with complaints of abdominal pain and intermittent rectal bleeding and multiple bowel movements a day that started several days prior to admission.,  She relates the pain is severe radiating to her back area CT scan of the abdomen and pelvis showed inflammation of the sigmoid and rectum  Assessment/Plan:   Colitis with rectal bleeding: Started empirically on IV Cipro and Flagyl, fentanyl IV for pain. CT Of the abdomen pelvis done on 01/04/2018 show inflammatory changes of the sigmoid and rectum and several segments of small bowel narrowing. Stool studies are negative. Continue IV fluids, replete electrolytes No further bleeding reported and H&H is actually stable although the patient reports that she has bright red blood per rectum.  Requested her to contact RN whenever she has a bowel movement to verify bleeding. Suspect patient actually has also history of proctitis and hemorrhoids and will provide Anusol suppositories for the same. Outpatient follow-up with GI.    Hypokalemia: Likely due to diarrhea, will replete orally and recheck tomorrow morning.  Hypomagnesemia: Repleted orally and recheck tomorrow morning.  Microcytic anemia: Iron studies are done shows an iron 17 saturation 4% ferritin is pending, type and screen.  Polysubstance abuse ([place]) Counseling.  Prolonged QT: 509.  Getting better.  Replace potassium.  Depression with suicidal ideation With a sitter at bedside, psychiatry recommends no inpatient psychiatric admission necessary.  Although patient continues to claim ideas of harming herself.  Will discuss with psychiatry again continue sitter.   DVT prophylaxis: scd Family  Communication:none Disposition Plan/Barrier to D/C: unable to determine Code Status:     Code Status Orders  (From admission, onward)        Start     Ordered   01/03/18 2245  Full code  Continuous     01/03/18 2248    Code Status History    Date Active Date Inactive Code Status Order ID Comments User Context   10/26/2017 1603 10/29/2017 1611 Full Code [id number]  Ethelene Hal, NP Inpatient   10/25/2017 1518 10/26/2017 1521 Full Code [id number]  Valarie Merino, MD ED   01/16/2017 1352 01/19/2017 1700 Full Code [id number]  Kerrie Buffalo, NP Inpatient   01/16/2017 1351 01/16/2017 1352 Full Code [id number]  Kerrie Buffalo, NP Inpatient   01/16/2017 0206 01/16/2017 1336 Full Code [id number]  Gareth Morgan, MD ED   07/16/2015 1504 07/20/2015 1607 Full Code [id number]  Delfin Gant, NP Inpatient   07/16/2015 0011 07/16/2015 1504 Full Code [id number]  Jeannett Senior, PA-C ED   03/16/2015 1614 03/17/2015 1435 Full Code [id number]  Noemi Chapel, MD ED   02/23/2015 2256 02/27/2015 1412 Full Code [id number]  Theressa Millard, MD ED   12/13/2014 0128 12/15/2014 1813 Full Code [id number]  Lavina Hamman, MD Inpatient   11/28/2014 1924 11/29/2014 1313 Full Code [id number]  Fransico Meadow, PA-C ED   10/30/2014 1853 10/31/2014 1108 Full Code [id number]  Davonna Belling, MD ED   06/30/2014 1929 07/04/2014 1853 Full Code [id number]  Kennedy Bucker, NP Inpatient   06/29/2014 1540 06/30/2014 1929 Full Code [id number]  Starlyn Skeans, PA-C ED   05/19/2014 2310 05/22/2014 1307 Full Code [id number]  Waylan Boga, NP Inpatient   05/18/2014 2212 05/19/2014 2310 Full Code  [id number]  Pamella Pert, MD ED   05/07/2014 2343 05/10/2014 1813 Full Code [id number]  Theodis Blaze, MD ED   04/15/2014 2247 04/17/2014 1757 Full Code [id number]  Berle Mull, MD ED   03/26/2014 0021 03/26/2014 1556 Full Code [id number]  Martie Lee, PA-C ED   02/23/2014 1215 02/27/2014 2120 Full Code [id number]  Leonie Man, MD  Inpatient   02/13/2014 2300 02/13/2014 2302 Full Code [id number]  Germain Osgood, PA-C ED   02/13/2014 2243 02/13/2014 2300 Full Code [id number]  Doree Fudge, MD ED   01/06/2014 1308 01/09/2014 1824 Full Code [id number]  Lurena Nida, NP Inpatient   01/06/2014 0615 01/06/2014 1308 Full Code [id number]  Mariea Clonts, MD ED   12/14/2013 0215 12/14/2013 1435 Full Code [id number]  Kalman Drape, MD ED   12/09/2013 0415 12/11/2013 1711 Full Code [id number]  Theodis Blaze, MD Inpatient   11/25/2013 1848 11/27/2013 1217 Full Code [id number]  Domenic Polite, MD Inpatient   11/23/2013 0417 11/24/2013 1858 Full Code [id number]  Berle Mull, MD Inpatient   10/29/2013 0040 10/29/2013 2214 Full Code [id number]  Phillips Grout, MD Inpatient   09/28/2013 2301 09/29/2013 1550 Full Code [id number]  Terrance Mass, MD Inpatient        IV Access:    Peripheral IV   Procedures and diagnostic studies:   Ct Abdomen Pelvis W Contrast  Result Date: 01/03/2018 CLINICAL DATA:  42 y/o F; history of colitis and proctitis presenting with severe abdominal pain, back pain, and rectal bleeding. EXAM: CT ABDOMEN AND PELVIS WITH CONTRAST TECHNIQUE: Multidetector CT imaging of the abdomen and pelvis was performed using the standard protocol following bolus administration of intravenous contrast. CONTRAST:  100 cc Isovue-300 COMPARISON:  09/20/2017 CT abdomen and pelvis FINDINGS: Lower chest: No acute abnormality. Hepatobiliary: No focal liver abnormality is seen. No gallstones, gallbladder wall thickening, or biliary dilatation. Pancreas: Unremarkable. No pancreatic ductal dilatation or surrounding inflammatory changes. Spleen: Normal in size without focal abnormality. Adrenals/Urinary Tract: Adrenal glands are unremarkable. Kidneys are normal, without renal calculi, focal lesion, or hydronephrosis. Bladder is unremarkable. Stomach/Bowel: Appendectomy. Normal appearance of the stomach and small bowel without obstruction or  inflammation. There are several segments of mild wall thickening of the sigmoid colon and rectum the compatible with colitis. No evidence for perforation or abscess. Additionally, there are several segments of narrowing of the lumen of the colon involving the transverse colon, descending colon, sigmoid colon, and rectum which may represent areas of stricture. Vascular/Lymphatic: No significant vascular findings are present. No enlarged abdominal or pelvic lymph nodes. Reproductive: Uterus and bilateral adnexa are unremarkable. Other: No abdominal wall hernia or abnormality. No abdominopelvic ascites. Musculoskeletal: L5-S1 grade 2 anterolisthesis and chronic pars defects. No acute fracture identified. IMPRESSION: 1. Segments of mild wall thickening of sigmoid colon and rectum compatible with infectious or inflammatory colitis. No evidence for perforation or abscess. 2. Several segments of colon narrowing which may represent stricture. 3. L5-S1 grade 2 anterolisthesis with chronic L5 pars defects. Electronically Signed   By: Kristine Garbe M.D.   On: 01/03/2018 21:11     Medical Consultants:    None.  Anti-Infectives:   Cipro and Flagyl  Subjective:    Kristen Roberson relates her abdominal pain is slightly improved but he had an episode of bloody bowel movement this morning.  Objective:    Vitals:   01/04/18 2111 01/05/18 0032 01/05/18 0524 01/05/18 1334  BP: (!) 143/78 129/83 131/82 125/82  Pulse:  67 75 67 78  Resp:  20 20 16   Temp:  97.6 F (36.4 C) 97.7 F (36.5 C) 98.7 F (37.1 C)  TempSrc:    Oral  SpO2: 99% 100% 97% 99%  Weight:      Height:        Intake/Output Summary (Last 24 hours) at 01/05/2018 1751 Last data filed at 01/05/2018 1600 Gross per 24 hour  Intake 1818.75 ml  Output -  Net 1818.75 ml   Filed Weights   01/03/18 2103 01/04/18 0055  Weight: 68 kg (150 lb) 68 kg (150 lb)    Exam: General exam: In no acute distress. Respiratory system: Good air  movement and clear to auscultation. Cardiovascular system: S1 & S2 heard, RRR.  Gastrointestinal system: Abdomen is nondistended, soft and nontender.  Central nervous system: Alert and oriented. No focal neurological deficits. Extremities: No pedal edema. Skin: No rashes, lesions or ulcers Psychiatry: Judgement and insight appear normal. Mood & affect appropriate.    Data Reviewed:    Labs: Basic Metabolic Panel: Recent Labs  Lab 01/03/18 1823 01/04/18 0710 01/05/18 0512  NA 134*  --  139  K 3.2*  --  3.7  CL 102  --  110  CO2 23  --  22  GLUCOSE 105*  --  96  BUN <5*  --  <5*  CREATININE 0.47  --  0.42*  CALCIUM 8.4*  --  7.9*  MG 1.5* 1.9  --    GFR Estimated Creatinine Clearance: 85.8 mL/min (A) (by C-G formula based on SCr of 0.42 mg/dL (L)). Liver Function Tests: Recent Labs  Lab 01/03/18 1823  AST 63*  ALT 30  ALKPHOS 98  BILITOT 1.3*  PROT 7.7  ALBUMIN 4.2   Recent Labs  Lab 01/03/18 1823  LIPASE 23   No results for input(s): AMMONIA in the last 168 hours. Coagulation profile No results for input(s): INR, PROTIME in the last 168 hours.  CBC: Recent Labs  Lab 01/03/18 1823 01/04/18 0710 01/04/18 2002 01/05/18 0508  WBC 5.3 3.5* 4.2  --   NEUTROABS 3.7  --   --   --   HGB 7.3* 7.3* 9.5* 9.9*  HCT 25.5* 25.0* 31.3* 32.8*  MCV 72.6* 75.1* 78.8  --   PLT 235 163 133*  --    Cardiac Enzymes: No results for input(s): CKTOTAL, CKMB, CKMBINDEX, TROPONINI in the last 168 hours. BNP (last 3 results) No results for input(s): PROBNP in the last 8760 hours. CBG: No results for input(s): GLUCAP in the last 168 hours. D-Dimer: No results for input(s): DDIMER in the last 72 hours. Hgb A1c: No results for input(s): HGBA1C in the last 72 hours. Lipid Profile: No results for input(s): CHOL, HDL, LDLCALC, TRIG, CHOLHDL, LDLDIRECT in the last 72 hours. Thyroid function studies: No results for input(s): TSH, T4TOTAL, T3FREE, THYROIDAB in the last 72  hours.  Invalid input(s): FREET3 Anemia work up: Recent Labs    01/03/18 1823  TIBC 418  IRON 17*   Sepsis Labs: Recent Labs  Lab 01/03/18 1823 01/03/18 1834 01/03/18 1957 01/04/18 0710 01/04/18 2002  WBC 5.3  --   --  3.5* 4.2  LATICACIDVEN  --  1.88 1.40  --   --    Microbiology Recent Results (from the past 240 hour(s))  Urine culture     Status: Abnormal (Preliminary result)   Collection Time: 01/03/18  7:33 PM  Result Value Ref Range Status   Specimen Description   Final  URINE, RANDOM Performed at [facility], [place] [address] [place], [place] [address]    Special Requests   Final    NONE Performed at [facility], [place] [address], [place], [place] [address]    Culture (A)  Final    >=100,000 COLONIES/mL ESCHERICHIA COLI SUSCEPTIBILITIES TO FOLLOW Performed at [facility], [place] [address] [place], [place] [address]    Report Status PENDING  Incomplete  C difficile quick scan w PCR reflex     Status: None   Collection Time: 01/04/18 11:54 AM  Result Value Ref Range Status   C Diff antigen NEGATIVE NEGATIVE Final   C Diff toxin NEGATIVE NEGATIVE Final   C Diff interpretation No C. difficile detected.  Final    Comment: Performed at [facility], [place] [address] [place], [place] [address]     Medications:   . ciprofloxacin  500 mg Oral BID  . FLUoxetine  40 mg Oral Daily  . hydrocortisone  25 mg Rectal BID  . levETIRAcetam  1,000 mg Oral BID  . metroNIDAZOLE  500 mg Oral Q8H   Continuous Infusions: . sodium chloride 75 mL/hr at 01/05/18 1648     LOS: 2 days   [facility]  *Please refer to [email or web address], password [email or web address] to get updated schedule on who will round on this patient, as hospitalists switch teams weekly. If 7PM-7AM, please contact night-coverage at [email or web address], password [email or web address] for any overnight needs.  01/05/2018, 5:51 PM
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
## 2018-01-06 DIAGNOSIS — R45851 Suicidal ideations: Secondary | ICD-10-CM

## 2018-01-06 LAB — CBC WITH DIFFERENTIAL/PLATELET
BASOS PCT: 1 %
Basophils Absolute: 0 10*3/uL (ref 0.0–0.1)
EOS ABS: 0.1 10*3/uL (ref 0.0–0.7)
Eosinophils Relative: 3 %
HCT: 35.4 % — ABNORMAL LOW (ref 36.0–46.0)
Hemoglobin: 10.7 g/dL — ABNORMAL LOW (ref 12.0–15.0)
LYMPHS ABS: 0.5 10*3/uL — AB (ref 0.7–4.0)
Lymphocytes Relative: 11 %
MCH: 24.2 pg — AB (ref 26.0–34.0)
MCHC: 30.2 g/dL (ref 30.0–36.0)
MCV: 80.1 fL (ref 78.0–100.0)
MONO ABS: 0.4 10*3/uL (ref 0.1–1.0)
Monocytes Relative: 9 %
Neutro Abs: 3.2 10*3/uL (ref 1.7–7.7)
Neutrophils Relative %: 76 %
PLATELETS: 136 10*3/uL — AB (ref 150–400)
RBC: 4.42 MIL/uL (ref 3.87–5.11)
RDW: 21.8 % — AB (ref 11.5–15.5)
WBC: 4.2 10*3/uL (ref 4.0–10.5)

## 2018-01-06 LAB — URINE CULTURE: Culture: >=100000 — AB

## 2018-01-06 LAB — BASIC METABOLIC PANEL
Anion gap: 6 (ref 5–15)
CALCIUM: 8.4 mg/dL — AB (ref 8.9–10.3)
CO2: 25 mmol/L (ref 22–32)
CREATININE: 0.45 mg/dL (ref 0.44–1.00)
Chloride: 108 mmol/L (ref 101–111)
GFR calc Af Amer: >60 mL/min (ref >60)
GFR calc non Af Amer: >60 mL/min (ref >60)
Glucose, Bld: 105 mg/dL — ABNORMAL HIGH (ref 65–99)
Potassium: 3.6 mmol/L (ref 3.5–5.1)
Sodium: 139 mmol/L (ref 135–145)

## 2018-01-06 LAB — MAGNESIUM: Magnesium: 1.6 mg/dL — ABNORMAL LOW (ref 1.7–2.4)

## 2018-01-06 LAB — RETICULOCYTES
RBC.: 4.42 MIL/uL (ref 3.87–5.11)
Retic Count, Absolute: 75.1 10*3/uL (ref 19.0–186.0)
Retic Ct Pct: 1.7 % (ref 0.4–3.1)

## 2018-01-06 MED ORDER — POTASSIUM CHLORIDE CRYS ER 20 MEQ PO TBCR
40.0000 meq | EXTENDED_RELEASE_TABLET | Freq: Once | ORAL | Status: AC
  Administered 2018-01-06: 40 meq via ORAL
  Filled 2018-01-06: qty 2

## 2018-01-06 MED ORDER — MAGNESIUM SULFATE 4 GM/100ML IV SOLN
4.0000 g | Freq: Once | INTRAVENOUS | Status: AC
  Administered 2018-01-06: 4 g via INTRAVENOUS
  Filled 2018-01-06: qty 100

## 2018-01-06 NOTE — Progress Notes (Signed)
Patient went to the bathroom and urine and stool were collected separately in collection containers. RN assessed the urine and stool output. Urine was light pink in color and clear. Patient reported to RN that she is bleeding from "both the front and the back side." The stool was 300 cc in amount and was brown liquid stool mixed with cherry red liquid. T  When RN gave patient scheduled suppository this morning only brown stool was noted on finger after insertion.   RN asked patient to continue to save all output for monitoring by the RN.   Kristen Roberson [facility]  01/06/2018 11:15 AM

## 2018-01-06 NOTE — Progress Notes (Signed)
[facility] PROGRESS NOTE    Progress Note  Kristen Roberson  YQM:[id number] DOB: 05-12-1976 DOA: 01/03/2018 PCP: Tresa Garter, MD     Brief Narrative:   Kristen Roberson is an 42 y.o. female past medical history of alcohol abuse, anxiety and depression who presents with complaints of abdominal pain and intermittent rectal bleeding and multiple bowel movements a day that started several days prior to admission.,  She relates the pain is severe radiating to her back area CT scan of the abdomen and pelvis showed inflammation of the sigmoid and rectum  Assessment/Plan:   Colitis with rectal bleeding: Colonic stricture Chronic microscopic anemia   Started empirically on IV Cipro and Flagyl, fentanyl IV for pain. CT Of the abdomen pelvis done on 01/04/2018 show inflammatory changes of the sigmoid and rectum and several segments of small bowel narrowing. Stool studies are negative. Stop IV fluids, replete electrolytes She does continues to have bright red-cherry red blood as well as clots in her stool with multiple bowel movements. GI consulted given the history of recurrent BRBPR and finding of colonic narrowing concerning for stricture. [facility] recommend getting patient on clear liquid diet with possible colonoscopy on Saturday. New Anusol suppositories.    Hypokalemia: Likely due to diarrhea, will replete orally and recheck tomorrow morning.  Hypomagnesemia: Repleted orally and recheck tomorrow morning.  Microcytic anemia: Iron studies are done shows an iron 17 saturation 4%  Likely chronic blood loss most likely from GI sources. GI consulted will follow on findings.  Polysubstance abuse ([place]) Counseling.  Prolonged QT: 509.  Getting better.  Replace potassium.  Depression with suicidal ideation With a sitter at bedside, psychiatry recommends no inpatient psychiatric admission necessary.  Although patient continues to claim ideas of harming herself.  Vernard Gambles currently is  recommended to have inpatient psychiatric admission.  Social worker consulted for the same.   DVT prophylaxis: scd Family Communication:none Disposition Plan/Barrier to D/C: unable to determine Code Status:     Code Status Orders  (From admission, onward)        Start     Ordered   01/03/18 2245  Full code  Continuous     01/03/18 2248    Code Status History    Date Active Date Inactive Code Status Order ID Comments User Context   10/26/2017 1603 10/29/2017 1611 Full Code [id number]  Ethelene Hal, NP Inpatient   10/25/2017 1518 10/26/2017 1521 Full Code [id number]  Valarie Merino, MD ED   01/16/2017 1352 01/19/2017 1700 Full Code [id number]  Kerrie Buffalo, NP Inpatient   01/16/2017 1351 01/16/2017 1352 Full Code [id number]  Kerrie Buffalo, NP Inpatient   01/16/2017 0206 01/16/2017 1336 Full Code [id number]  Gareth Morgan, MD ED   07/16/2015 1504 07/20/2015 1607 Full Code [id number]  Delfin Gant, NP Inpatient   07/16/2015 0011 07/16/2015 1504 Full Code [id number]  Jeannett Senior, PA-C ED   03/16/2015 1614 03/17/2015 1435 Full Code [id number]  Noemi Chapel, MD ED   02/23/2015 2256 02/27/2015 1412 Full Code [id number]  Theressa Millard, MD ED   12/13/2014 0128 12/15/2014 1813 Full Code [id number]  Lavina Hamman, MD Inpatient   11/28/2014 1924 11/29/2014 1313 Full Code [id number]  Fransico Meadow, PA-C ED   10/30/2014 1853 10/31/2014 1108 Full Code [id number]  Davonna Belling, MD ED   06/30/2014 1929 07/04/2014 1853 Full Code [id number]  Kennedy Bucker, NP Inpatient   06/29/2014 1540 06/30/2014 1929 Full Code [id number]  Starlyn Skeans, PA-C ED  05/19/2014 2310 05/22/2014 1307 Full Code [id number]  Waylan Boga, NP Inpatient   05/18/2014 2212 05/19/2014 2310 Full Code [id number]  Pamella Pert, MD ED   05/07/2014 2343 05/10/2014 1813 Full Code [id number]  Theodis Blaze, MD ED   04/15/2014 2247 04/17/2014 1757 Full Code [id number]  Berle Mull, MD ED   03/26/2014 0021 03/26/2014  1556 Full Code [id number]  Martie Lee, PA-C ED   02/23/2014 1215 02/27/2014 2120 Full Code [id number]  Leonie Man, MD Inpatient   02/13/2014 2300 02/13/2014 2302 Full Code [id number]  Germain Osgood, PA-C ED   02/13/2014 2243 02/13/2014 2300 Full Code [id number]  Doree Fudge, MD ED   01/06/2014 1308 01/09/2014 1824 Full Code [id number]  Lurena Nida, NP Inpatient   01/06/2014 0615 01/06/2014 1308 Full Code [id number]  Mariea Clonts, MD ED   12/14/2013 0215 12/14/2013 1435 Full Code [id number]  Kalman Drape, MD ED   12/09/2013 0415 12/11/2013 1711 Full Code [id number]  Theodis Blaze, MD Inpatient   11/25/2013 1848 11/27/2013 1217 Full Code [id number]  Domenic Polite, MD Inpatient   11/23/2013 0417 11/24/2013 1858 Full Code [id number]  Berle Mull, MD Inpatient   10/29/2013 0040 10/29/2013 2214 Full Code [id number]  Phillips Grout, MD Inpatient   09/28/2013 2301 09/29/2013 1550 Full Code [id number]  Terrance Mass, MD Inpatient        IV Access:    Peripheral IV   Procedures and diagnostic studies:   No results found.   Medical Consultants:    None.  Anti-Infectives:   Cipro and Flagyl  Subjective:    Kristen Roberson relates her abdominal pain is slightly improved but he had an episode of bloody bowel movement this morning.  Objective:    Vitals:   01/05/18 1334 01/05/18 2141 01/06/18 0520 01/06/18 1223  BP: 125/82 133/83 114/64 115/77  Pulse: 78 62 74 62  Resp: 16 (!) 22 18 18   Temp: 98.7 F (37.1 C) 98 F (36.7 C) 97.6 F (36.4 C) 97.9 F (36.6 C)  TempSrc: Oral Oral Oral   SpO2: 99% 95% 98% 100%  Weight:      Height:        Intake/Output Summary (Last 24 hours) at 01/06/2018 1640 Last data filed at 01/06/2018 1443 Gross per 24 hour  Intake 1762.5 ml  Output 1950 ml  Net -187.5 ml   Filed Weights   01/03/18 2103 01/04/18 0055  Weight: 68 kg (150 lb) 68 kg (150 lb)    Exam: General exam: In no acute distress. Respiratory system: Good air movement  and clear to auscultation. Cardiovascular system: S1 & S2 heard, RRR.  Gastrointestinal system: Abdomen is nondistended, soft and nontender.  Central nervous system: Alert and oriented. No focal neurological deficits. Extremities: No pedal edema. Skin: No rashes, lesions or ulcers Psychiatry: Judgement and insight appear normal. Mood & affect appropriate.    Data Reviewed:    Labs: Basic Metabolic Panel: Recent Labs  Lab 01/03/18 1823 01/04/18 0710 01/05/18 0512 01/06/18 0502  NA 134*  --  139 139  K 3.2*  --  3.7 3.6  CL 102  --  110 108  CO2 23  --  22 25  GLUCOSE 105*  --  96 105*  BUN <5*  --  <5* <5*  CREATININE 0.47  --  0.42* 0.45  CALCIUM 8.4*  --  7.9* 8.4*  MG 1.5* 1.9  --  1.6*   GFR Estimated Creatinine Clearance: 85.8 mL/min (  by C-G formula based on SCr of 0.45 mg/dL). Liver Function Tests: Recent Labs  Lab 01/03/18 1823  AST 63*  ALT 30  ALKPHOS 98  BILITOT 1.3*  PROT 7.7  ALBUMIN 4.2   Recent Labs  Lab 01/03/18 1823  LIPASE 23   No results for input(s): AMMONIA in the last 168 hours. Coagulation profile No results for input(s): INR, PROTIME in the last 168 hours.  CBC: Recent Labs  Lab 01/03/18 1823 01/04/18 0710 01/04/18 2002 01/05/18 0508 01/06/18 1320  WBC 5.3 3.5* 4.2  --  4.2  NEUTROABS 3.7  --   --   --  3.2  HGB 7.3* 7.3* 9.5* 9.9* 10.7*  HCT 25.5* 25.0* 31.3* 32.8* 35.4*  MCV 72.6* 75.1* 78.8  --  80.1  PLT 235 163 133*  --  136*   Cardiac Enzymes: No results for input(s): CKTOTAL, CKMB, CKMBINDEX, TROPONINI in the last 168 hours. BNP (last 3 results) No results for input(s): PROBNP in the last 8760 hours. CBG: No results for input(s): GLUCAP in the last 168 hours. D-Dimer: No results for input(s): DDIMER in the last 72 hours. Hgb A1c: No results for input(s): HGBA1C in the last 72 hours. Lipid Profile: No results for input(s): CHOL, HDL, LDLCALC, TRIG, CHOLHDL, LDLDIRECT in the last 72 hours. Thyroid function  studies: No results for input(s): TSH, T4TOTAL, T3FREE, THYROIDAB in the last 72 hours.  Invalid input(s): FREET3 Anemia work up: Recent Labs    01/03/18 1823 01/06/18 1320  TIBC 418  --   IRON 17*  --   RETICCTPCT  --  1.7   Sepsis Labs: Recent Labs  Lab 01/03/18 1823 01/03/18 1834 01/03/18 1957 01/04/18 0710 01/04/18 2002 01/06/18 1320  WBC 5.3  --   --  3.5* 4.2 4.2  LATICACIDVEN  --  1.88 1.40  --   --   --    Microbiology Recent Results (from the past 240 hour(s))  Urine culture     Status: Abnormal   Collection Time: 01/03/18  7:33 PM  Result Value Ref Range Status   Specimen Description   Final    URINE, RANDOM Performed at [facility], [place] [address], [place], [place] [address]    Special Requests   Final    NONE Performed at [facility], [place] [address] [place],  [address]    Culture >=100,000 COLONIES/mL ESCHERICHIA COLI (A)  Final   Report Status 01/06/2018 FINAL  Final   Organism ID, Bacteria ESCHERICHIA COLI (A)  Final      Susceptibility   Escherichia coli - MIC*    AMPICILLIN <=2 SENSITIVE Sensitive     CEFAZOLIN <=4 SENSITIVE Sensitive     CEFTRIAXONE <=1 SENSITIVE Sensitive     CIPROFLOXACIN <=0.25 SENSITIVE Sensitive     GENTAMICIN <=1 SENSITIVE Sensitive     IMIPENEM <=0.25 SENSITIVE Sensitive     NITROFURANTOIN <=16 SENSITIVE Sensitive     TRIMETH/SULFA <=20 SENSITIVE Sensitive     AMPICILLIN/SULBACTAM <=2 SENSITIVE Sensitive     PIP/TAZO <=4 SENSITIVE Sensitive     Extended ESBL NEGATIVE Sensitive     * >=100,000 COLONIES/mL ESCHERICHIA COLI  C difficile quick scan w PCR reflex     Status: None   Collection Time: 01/04/18 11:54 AM  Result Value Ref Range Status   C Diff antigen NEGATIVE NEGATIVE Final   C Diff toxin NEGATIVE NEGATIVE Final   C Diff interpretation No C. difficile detected.  Final  Comment: Performed at [facility], [place] [address], [place], [place]  [address]     Medications:   . ciprofloxacin  500 mg Oral BID  . FLUoxetine  40 mg Oral Daily  . hydrocortisone  25 mg Rectal BID  . levETIRAcetam  1,000 mg Oral BID  . metroNIDAZOLE  500 mg Oral Q8H   Continuous Infusions:    LOS: 3 days   Berle Mull  [facility]  *Please refer to [email or web address], password [email or web address] to get updated schedule on who will round on this patient, as hospitalists switch teams weekly. If 7PM-7AM, please contact night-coverage at [email or web address], password [email or web address] for any overnight needs.  01/06/2018, 4:40 PM

## 2018-01-06 NOTE — Progress Notes (Signed)
CSW informed by Psychiatrist Mariea Clonts that the recommendation is for patient to go for inpatient psychiatric placement.   CSW contacted [facility] and made referral for patient. CSW awaiting return call from [facility] to see if they are able to accept patient.  CSW will continue to follow and seek inpatient psychiatric placement for patient.  Abundio Miu, [place] Social Worker [facility] Cell#: [phone]

## 2018-01-06 NOTE — Progress Notes (Signed)
After progression rounds with the MD and he stated that we would await Psychiatry's recommendations for discharge planning the patient continued to express suicidal ideations to the RN. Patient stated to RN, "if they don't send me to behavioral health when I leave here, I will walk out in front of a car when I leave and then I will be back here in the morgue." RN listened to the patient's expression of feeling and concerns and offered therapeutic responses and support. Suicide precautions remain in place and sitter remains at bedside.    Othella Boyer [facility] 01/06/2018 11:01 AM

## 2018-01-06 NOTE — Progress Notes (Signed)
Patient had another liquid bowel movement that was 200cc in amount and color was brown with cherry red liquid as before. There was also a dark red blood clot noted this time. Urine and stool were collected separately.   Kristen Roberson [facility] 01/06/2018 12:00 PM
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
## 2018-01-07 ENCOUNTER — Encounter (HOSPITAL_COMMUNITY): Payer: Self-pay | Admitting: Anesthesiology

## 2018-01-07 LAB — GASTROINTESTINAL PANEL BY PCR, STOOL (REPLACES STOOL CULTURE)

## 2018-01-07 LAB — CBC
HCT: 36.6 % (ref 36.0–46.0)
Hemoglobin: 11.2 g/dL — ABNORMAL LOW (ref 12.0–15.0)
MCH: 24.6 pg — AB (ref 26.0–34.0)
MCHC: 30.6 g/dL (ref 30.0–36.0)
MCV: 80.3 fL (ref 78.0–100.0)
PLATELETS: 153 10*3/uL (ref 150–400)
RBC: 4.56 MIL/uL (ref 3.87–5.11)
RDW: 22.5 % — AB (ref 11.5–15.5)
WBC: 3.2 10*3/uL — ABNORMAL LOW (ref 4.0–10.5)

## 2018-01-07 LAB — BASIC METABOLIC PANEL
Anion gap: 8 (ref 5–15)
CALCIUM: 8.8 mg/dL — AB (ref 8.9–10.3)
CO2: 23 mmol/L (ref 22–32)
CREATININE: 0.47 mg/dL (ref 0.44–1.00)
Chloride: 105 mmol/L (ref 101–111)
GFR calc non Af Amer: >60 mL/min (ref >60)
GLUCOSE: 100 mg/dL — AB (ref 65–99)
Potassium: 3.9 mmol/L (ref 3.5–5.1)
Sodium: 136 mmol/L (ref 135–145)

## 2018-01-07 LAB — SEDIMENTATION RATE: Sed Rate: 2 mm/hr (ref 0–22)

## 2018-01-07 LAB — C-REACTIVE PROTEIN: CRP: <0.8 mg/dL (ref <1.0)

## 2018-01-07 MED ORDER — PEG 3350-KCL-NA BICARB-NACL 420 G PO SOLR
4000.0000 mL | Freq: Once | ORAL | Status: AC
  Administered 2018-01-07: 4000 mL via ORAL

## 2018-01-07 NOTE — H&P (View-Only) (Signed)
Subjective:   HPI  The patient is a 42 year old female admitted because of back pain lower abdominal pain  with associated multiple bowel movements. The patient states that the pain in back started 10 days ago with  Rectal bleeding.She had a CT scan which showed inflammation in the rectum and sigmoid.  Question colonic strictures. She Has a history of colitis. She had a colonoscopy here in 2015 which was negative. This was done 2015, then reports that she had a colonoscopy in the [place] a couple of years ago at one of their hospitals and was told she had colitis. We do not have any details of this however. Her stool for C. Difficile was negative. I do not see a GI pathogens panel in reviewing records. This has be ordered.  Review of Systems Denies chest pain or shortness of breath  Past Medical History:  Diagnosis Date  . Alcohol abuse   . Anemia   . Anxiety   . Blood transfusion without reported diagnosis   . Cardiac arrest ([place])   . Cysts of both ovaries   . Depression   . Fatty liver 10/05/13  . Proctitis   . Seizures ([place])    Past Surgical History:  Procedure Laterality Date  . APPENDECTOMY    . colitis    . COLONOSCOPY N/A 09/30/2013   Procedure: COLONOSCOPY;  Surgeon: Lafayette Dragon, MD;  [facility]: [facility];  Service: Endoscopy;  Laterality: N/A;  . ESOPHAGOGASTRODUODENOSCOPY N/A 11/23/2013   Procedure: ESOPHAGOGASTRODUODENOSCOPY (EGD);  Surgeon: Jerene Bears, MD;  Location: [facility];  Service: Endoscopy;  Laterality: N/A;  . LAPAROSCOPIC APPENDECTOMY Right 09/28/2013   Procedure: APPENDECTOMY LAPAROSCOPIC;  Surgeon: Terrance Mass, MD;  Location: [facility];  Service: Gynecology;  Laterality: Right;  . LAPAROSCOPY N/A 09/28/2013   Procedure: LAPAROSCOPY OPERATIVE;  Surgeon: Terrance Mass, MD;  Location: [facility];  Service: Gynecology;  Laterality: N/A;  . LEFT AND RIGHT HEART CATHETERIZATION WITH CORONARY ANGIOGRAM N/A 02/23/2014   Procedure: LEFT AND RIGHT HEART  CATHETERIZATION WITH CORONARY ANGIOGRAM;  Surgeon: Leonie Man, MD;  Location: [facility] CATH LAB;  Service: Cardiovascular;  Laterality: N/A;  . OVARIAN CYST REMOVAL    . SALPINGOOPHORECTOMY Right 09/28/2013   Procedure: SALPINGO OOPHORECTOMY;  Surgeon: Terrance Mass, MD;  Location: [facility];  Service: Gynecology;  Laterality: Right;   Social History   Socioeconomic History  . Marital status: Divorced    Spouse name: Not on file  . Number of children: Not on file  . Years of education: Not on file  . Highest education level: Not on file  Occupational History  . Not on file  Social Needs  . Financial resource strain: Not on file  . Food insecurity:    Worry: Not on file    Inability: Not on file  . Transportation needs:    Medical: Not on file    Non-medical: Not on file  Tobacco Use  . Smoking status: Never Smoker  . Smokeless tobacco: Never Used  Substance and Sexual Activity  . Alcohol use: Yes    Comment: States she does not drink anymore  . Drug use: No  . Sexual activity: Not on file  Lifestyle  . Physical activity:    Days per week: Not on file    Minutes per session: Not on file  . Stress: Not on file  Relationships  . Social connections:    Talks on phone: Not on file    Gets together: Not on  file    Attends religious service: Not on file    Active member of club or organization: Not on file    Attends meetings of clubs or organizations: Not on file    Relationship status: Not on file  . Intimate partner violence:    Fear of current or ex partner: Not on file    Emotionally abused: Not on file    Physically abused: Not on file    Forced sexual activity: Not on file  Other Topics Concern  . Not on file  Social History Narrative  . Not on file   family history includes Diabetes in her father and mother; Hyperlipidemia in her mother; Stroke in her mother.  Current Facility-Administered Medications:  .  acetaminophen (TYLENOL) tablet 650 mg, 650 mg, Oral,  Q6H PRN, 650 mg at 01/07/18 0546 **OR** acetaminophen (TYLENOL) suppository 650 mg, 650 mg, Rectal, Q6H PRN, Smith, Rondell A, MD .  albuterol (PROVENTIL) (2.5 MG/3ML) 0.083% nebulizer solution 2.5 mg, 2.5 mg, Nebulization, Q6H PRN, Smith, Rondell A, MD .  ciprofloxacin (CIPRO) tablet 500 mg, 500 mg, Oral, BID, Lavina Hamman, MD, 500 mg at 01/06/18 1935 .  FLUoxetine (PROZAC) capsule 40 mg, 40 mg, Oral, Daily, Lavina Hamman, MD, 40 mg at 01/06/18 1028 .  hydrocortisone (ANUSOL-HC) suppository 25 mg, 25 mg, Rectal, BID, Lavina Hamman, MD, 25 mg at 01/06/18 2215 .  hydrOXYzine (ATARAX/VISTARIL) tablet 25 mg, 25 mg, Oral, TID PRN, Lavina Hamman, MD .  levETIRAcetam (KEPPRA) tablet 1,000 mg, 1,000 mg, Oral, BID, Charlynne Cousins, MD, 1,000 mg at 01/06/18 2215 .  metroNIDAZOLE (FLAGYL) tablet 500 mg, 500 mg, Oral, Q8H, Lavina Hamman, MD, 500 mg at 01/07/18 0546 .  ondansetron (ZOFRAN) injection 4 mg, 4 mg, Intravenous, Q6H PRN, Lavina Hamman, MD, 4 mg at 01/05/18 0933 .  polyethylene glycol-electrolytes (NuLYTELY/GoLYTELY) solution 4,000 mL, 4,000 mL, Oral, Once, Anson Fret F, MD .  traZODone (DESYREL) tablet 50 mg, 50 mg, Oral, QHS PRN, Lavina Hamman, MD, 50 mg at 01/06/18 2223 Allergies  Allergen Reactions  . Morphine And Related Anaphylaxis     Tolerated hydromorphone, norco and oxycodone    . Tramadol Other (See Comments)    Seizures   . Penicillins Other (See Comments)    Unknown childhood reaction. Has patient had a PCN reaction causing immediate rash, facial/tongue/throat swelling, SOB or lightheadedness with hypotension: Unknown Has patient had a PCN reaction causing severe rash involving mucus membranes or skin necrosis: Unknown Has patient had a PCN reaction that required hospitalization unknown Has patient had a PCN reaction occurring within the last 10 years: No If all of the above answers are "NO", then may proceed with Cephalosporin use.      Objective:      BP 108/69 (BP Location: Left Arm)   Pulse 61   Temp 97.6 F (36.4 C) (Oral)   Resp 16   Ht 5\' 6"  (1.676 m)   Wt 68 kg (150 lb)   LMP  (LMP Unknown)   SpO2 100%   BMI 24.21 kg/m   No acute distress  Heart regular :   Lungs clear.  Abdomen: Bowel sounds present, soft, some tenderness in lower abdomen but no rebound  Laboratory No components found for: D1    Assessment:     Rectal bleeding  CT scan raising the question of colitis.      Plan:     Continue antibiotics Proceed with colonoscopy tomorrow.

## 2018-01-07 NOTE — Anesthesia Preprocedure Evaluation (Addendum)
Anesthesia Evaluation  Patient identified by MRN, date of birth, ID band Patient awake    Reviewed: Allergy & Precautions, NPO status , Patient's Chart, lab work & pertinent test results  Airway Mallampati: II  TM Distance: >3 FB Neck ROM: Full    Dental  (+) Caps, Dental Advisory Given,    Pulmonary neg pulmonary ROS,    Pulmonary exam normal breath sounds clear to auscultation       Cardiovascular negative cardio ROS Normal cardiovascular exam Rhythm:Regular Rate:Normal  Hx/o cardiac arrest Echo 7/2015Left ventricle: The cavity size was normal. Wall thickness wasnormal. Systolic function was severely reduced. The estimatedejection fraction was in the range of 20% to 25%. There is severehypokinesis of the entireanteroseptal myocardium. Leftventricular diastolic function parameters were normal. - Mitral valve: There was mild to moderate regurgitation.  EKG 01/03/2018 NSR, Normal EKG   Neuro/Psych Seizures -, Well Controlled,  PSYCHIATRIC DISORDERS Anxiety Depression Bipolar Disorder Hx/o suicide attemptAlcohol induced peripheral neuropathy Alcohol withdrawal seizures  Neuromuscular disease    GI/Hepatic (+)     substance abuse  alcohol use and cocaine use, Hx/o polysubstance abuse Rectal bleeding   Endo/Other  Malnutrition  Renal/GU negative Renal ROS  negative genitourinary   Musculoskeletal negative musculoskeletal ROS (+)   Abdominal   Peds  Hematology  (+) anemia , Thrombocytopenia- likely ETOH related   Anesthesia Other Findings   Reproductive/Obstetrics                           Anesthesia Physical Anesthesia Plan  ASA: III  Anesthesia Plan: MAC   Post-op Pain Management:    Induction: Intravenous  PONV Risk Score and Plan: 2 and Propofol infusion, Ondansetron and Treatment may vary due to age or medical condition  Airway Management Planned: Natural Airway, Nasal  Cannula and Simple Face Mask  Additional Equipment:   Intra-op Plan:   Post-operative Plan:   Informed Consent: I have reviewed the patients History and Physical, chart, labs and discussed the procedure including the risks, benefits and alternatives for the proposed anesthesia with the patient or authorized representative who has indicated his/her understanding and acceptance.   Dental advisory given  Plan Discussed with: CRNA  Anesthesia Plan Comments:        Anesthesia Quick Evaluation

## 2018-01-07 NOTE — Consult Note (Signed)
Subjective:   HPI  The patient is a 42 year old female admitted because of back pain lower abdominal pain  with associated multiple bowel movements. The patient states that the pain in back started 10 days ago with  Rectal bleeding.She had a CT scan which showed inflammation in the rectum and sigmoid.  Question colonic strictures. She Has a history of colitis. She had a colonoscopy here in 2015 which was negative. This was done 2015, then reports that she had a colonoscopy in the [place] a couple of years ago at one of their hospitals and was told she had colitis. We do not have any details of this however. Her stool for C. Difficile was negative. I do not see a GI pathogens panel in reviewing records. This has be ordered.  Review of Systems Denies chest pain or shortness of breath  Past Medical History:  Diagnosis Date  . Alcohol abuse   . Anemia   . Anxiety   . Blood transfusion without reported diagnosis   . Cardiac arrest ([place])   . Cysts of both ovaries   . Depression   . Fatty liver 10/05/13  . Proctitis   . Seizures ([place])    Past Surgical History:  Procedure Laterality Date  . APPENDECTOMY    . colitis    . COLONOSCOPY N/A 09/30/2013   Procedure: COLONOSCOPY;  Surgeon: Lafayette Dragon, MD;  Location: [facility];  Service: Endoscopy;  Laterality: N/A;  . ESOPHAGOGASTRODUODENOSCOPY N/A 11/23/2013   Procedure: ESOPHAGOGASTRODUODENOSCOPY (EGD);  Surgeon: Jerene Bears, MD;  Location: [facility];  Service: Endoscopy;  Laterality: N/A;  . LAPAROSCOPIC APPENDECTOMY Right 09/28/2013   Procedure: APPENDECTOMY LAPAROSCOPIC;  Surgeon: Terrance Mass, MD;  Location: [facility];  Service: Gynecology;  Laterality: Right;  . LAPAROSCOPY N/A 09/28/2013   Procedure: LAPAROSCOPY OPERATIVE;  Surgeon: Terrance Mass, MD;  Location: [facility];  Service: Gynecology;  Laterality: N/A;  . LEFT AND RIGHT HEART CATHETERIZATION WITH CORONARY ANGIOGRAM N/A 02/23/2014   Procedure: LEFT AND RIGHT HEART  CATHETERIZATION WITH CORONARY ANGIOGRAM;  Surgeon: Leonie Man, MD;  Location: [facility];  Service: Cardiovascular;  Laterality: N/A;  . OVARIAN CYST REMOVAL    . SALPINGOOPHORECTOMY Right 09/28/2013   Procedure: SALPINGO OOPHORECTOMY;  Surgeon: Terrance Mass, MD;  Location: [facility];  Service: Gynecology;  Laterality: Right;   Social History   Socioeconomic History  . Marital status: Divorced    Spouse name: Not on file  . Number of children: Not on file  . Years of education: Not on file  . Highest education level: Not on file  Occupational History  . Not on file  Social Needs  . Financial resource strain: Not on file  . Food insecurity:    Worry: Not on file    Inability: Not on file  . Transportation needs:    Medical: Not on file    Non-medical: Not on file  Tobacco Use  . Smoking status: Never Smoker  . Smokeless tobacco: Never Used  Substance and Sexual Activity  . Alcohol use: Yes    Comment: States she does not drink anymore  . Drug use: No  . Sexual activity: Not on file  Lifestyle  . Physical activity:    Days per week: Not on file    Minutes per session: Not on file  . Stress: Not on file  Relationships  . Social connections:    Talks on phone: Not on file    Gets together: Not on  file    Attends religious service: Not on file    Active member of club or organization: Not on file    Attends meetings of clubs or organizations: Not on file    Relationship status: Not on file  . Intimate partner violence:    Fear of current or ex partner: Not on file    Emotionally abused: Not on file    Physically abused: Not on file    Forced sexual activity: Not on file  Other Topics Concern  . Not on file  Social History Narrative  . Not on file   family history includes Diabetes in her father and mother; Hyperlipidemia in her mother; Stroke in her mother.  Current Facility-Administered Medications:  .  acetaminophen (TYLENOL) tablet 650 mg, 650 mg, Oral,  Q6H PRN, 650 mg at 01/07/18 0546 **OR** acetaminophen (TYLENOL) suppository 650 mg, 650 mg, Rectal, Q6H PRN, Smith, Rondell A, MD .  albuterol (PROVENTIL) (2.5 MG/3ML) 0.083% nebulizer solution 2.5 mg, 2.5 mg, Nebulization, Q6H PRN, Smith, Rondell A, MD .  ciprofloxacin (CIPRO) tablet 500 mg, 500 mg, Oral, BID, Lavina Hamman, MD, 500 mg at 01/06/18 1935 .  FLUoxetine (PROZAC) capsule 40 mg, 40 mg, Oral, Daily, Lavina Hamman, MD, 40 mg at 01/06/18 1028 .  hydrocortisone (ANUSOL-HC) suppository 25 mg, 25 mg, Rectal, BID, Lavina Hamman, MD, 25 mg at 01/06/18 2215 .  hydrOXYzine (ATARAX/VISTARIL) tablet 25 mg, 25 mg, Oral, TID PRN, Lavina Hamman, MD .  levETIRAcetam (KEPPRA) tablet 1,000 mg, 1,000 mg, Oral, BID, Charlynne Cousins, MD, 1,000 mg at 01/06/18 2215 .  metroNIDAZOLE (FLAGYL) tablet 500 mg, 500 mg, Oral, Q8H, Lavina Hamman, MD, 500 mg at 01/07/18 0546 .  ondansetron (ZOFRAN) injection 4 mg, 4 mg, Intravenous, Q6H PRN, Lavina Hamman, MD, 4 mg at 01/05/18 0933 .  polyethylene glycol-electrolytes (NuLYTELY/GoLYTELY) solution 4,000 mL, 4,000 mL, Oral, Once, Anson Fret F, MD .  traZODone (DESYREL) tablet 50 mg, 50 mg, Oral, QHS PRN, Lavina Hamman, MD, 50 mg at 01/06/18 2223 Allergies  Allergen Reactions  . Morphine And Related Anaphylaxis     Tolerated hydromorphone, norco and oxycodone    . Tramadol Other (See Comments)    Seizures   . Penicillins Other (See Comments)    Unknown childhood reaction. Has patient had a PCN reaction causing immediate rash, facial/tongue/throat swelling, SOB or lightheadedness with hypotension: Unknown Has patient had a PCN reaction causing severe rash involving mucus membranes or skin necrosis: Unknown Has patient had a PCN reaction that required hospitalization unknown Has patient had a PCN reaction occurring within the last 10 years: No If all of the above answers are "NO", then may proceed with Cephalosporin use.      Objective:      BP 108/69 (BP Location: Left Arm)   Pulse 61   Temp 97.6 F (36.4 C) (Oral)   Resp 16   Ht 5\' 6"  (1.676 m)   Wt 68 kg (150 lb)   LMP  (LMP Unknown)   SpO2 100%   BMI 24.21 kg/m   No acute distress  Heart regular :   Lungs clear.  Abdomen: Bowel sounds present, soft, some tenderness in lower abdomen but no rebound  Laboratory No components found for: D1    Assessment:     Rectal bleeding  CT scan raising the question of colitis.      Plan:     Continue antibiotics Proceed with colonoscopy tomorrow.
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
## 2018-01-08 ENCOUNTER — Encounter (HOSPITAL_COMMUNITY)
Admission: EM | Disposition: A | Discharge status: Other Acute Inpatient Hospital | Payer: Self-pay | Source: Home / Self Care | Attending: Internal Medicine

## 2018-01-08 ENCOUNTER — Inpatient Hospital Stay (HOSPITAL_COMMUNITY): Payer: Self-pay | Admitting: Certified Registered Nurse Anesthetist

## 2018-01-08 ENCOUNTER — Encounter (HOSPITAL_COMMUNITY): Payer: Self-pay

## 2018-01-08 ENCOUNTER — Inpatient Hospital Stay (HOSPITAL_COMMUNITY)
Admission: AD | Admit: 2018-01-08 | Discharge: 2018-01-12 | DRG: 885 | Disposition: A | Discharge status: Home / Self Care | Payer: No Typology Code available for payment source | Source: Intra-hospital | Attending: Psychiatry | Admitting: Psychiatry

## 2018-01-08 ENCOUNTER — Other Ambulatory Visit: Payer: Self-pay

## 2018-01-08 DIAGNOSIS — Z823 Family history of stroke: Secondary | ICD-10-CM | POA: Diagnosis not present

## 2018-01-08 DIAGNOSIS — F1020 Alcohol dependence, uncomplicated: Secondary | ICD-10-CM | POA: Diagnosis present

## 2018-01-08 DIAGNOSIS — Z833 Family history of diabetes mellitus: Secondary | ICD-10-CM | POA: Diagnosis not present

## 2018-01-08 DIAGNOSIS — R45851 Suicidal ideations: Secondary | ICD-10-CM | POA: Diagnosis present

## 2018-01-08 DIAGNOSIS — R569 Unspecified convulsions: Secondary | ICD-10-CM | POA: Diagnosis present

## 2018-01-08 DIAGNOSIS — Z8674 Personal history of sudden cardiac arrest: Secondary | ICD-10-CM

## 2018-01-08 DIAGNOSIS — I252 Old myocardial infarction: Secondary | ICD-10-CM | POA: Diagnosis not present

## 2018-01-08 DIAGNOSIS — F1024 Alcohol dependence with alcohol-induced mood disorder: Secondary | ICD-10-CM | POA: Diagnosis not present

## 2018-01-08 DIAGNOSIS — Z915 Personal history of self-harm: Secondary | ICD-10-CM | POA: Diagnosis not present

## 2018-01-08 DIAGNOSIS — Z8349 Family history of other endocrine, nutritional and metabolic diseases: Secondary | ICD-10-CM

## 2018-01-08 DIAGNOSIS — F332 Major depressive disorder, recurrent severe without psychotic features: Secondary | ICD-10-CM | POA: Diagnosis present

## 2018-01-08 DIAGNOSIS — F1420 Cocaine dependence, uncomplicated: Secondary | ICD-10-CM

## 2018-01-08 DIAGNOSIS — F322 Major depressive disorder, single episode, severe without psychotic features: Secondary | ICD-10-CM | POA: Diagnosis not present

## 2018-01-08 DIAGNOSIS — Z590 Homelessness: Secondary | ICD-10-CM

## 2018-01-08 HISTORY — PX: COLONOSCOPY WITH PROPOFOL: SHX5780

## 2018-01-08 HISTORY — PX: BIOPSY: SHX5522

## 2018-01-08 LAB — BASIC METABOLIC PANEL
ANION GAP: 9 (ref 5–15)
BUN: <5 mg/dL — ABNORMAL LOW (ref 6–20)
CHLORIDE: 105 mmol/L (ref 101–111)
CO2: 24 mmol/L (ref 22–32)
CREATININE: 0.5 mg/dL (ref 0.44–1.00)
Calcium: 8.8 mg/dL — ABNORMAL LOW (ref 8.9–10.3)
GFR calc Af Amer: >60 mL/min (ref >60)
GFR calc non Af Amer: >60 mL/min (ref >60)
Glucose, Bld: 97 mg/dL (ref 65–99)
POTASSIUM: 3.6 mmol/L (ref 3.5–5.1)
Sodium: 138 mmol/L (ref 135–145)

## 2018-01-08 SURGERY — COLONOSCOPY WITH PROPOFOL
Anesthesia: Monitor Anesthesia Care

## 2018-01-08 MED ORDER — PROPOFOL 10 MG/ML IV BOLUS
INTRAVENOUS | Status: AC
  Filled 2018-01-08: qty 20

## 2018-01-08 MED ORDER — FLUOXETINE HCL 20 MG PO CAPS
40.0000 mg | ORAL_CAPSULE | Freq: Every day | ORAL | Status: DC
  Administered 2018-01-09 – 2018-01-13 (×5): 40 mg via ORAL
  Filled 2018-01-08 (×4): qty 2
  Filled 2018-01-08 (×2): qty 28
  Filled 2018-01-08 (×2): qty 2

## 2018-01-08 MED ORDER — TRAZODONE HCL 50 MG PO TABS
50.0000 mg | ORAL_TABLET | Freq: Every evening | ORAL | Status: DC | PRN
  Administered 2018-01-08: 50 mg via ORAL
  Filled 2018-01-08: qty 1

## 2018-01-08 MED ORDER — ACETAMINOPHEN 325 MG PO TABS
650.0000 mg | ORAL_TABLET | Freq: Four times a day (QID) | ORAL | Status: DC | PRN
  Administered 2018-01-08 – 2018-01-09 (×2): 650 mg via ORAL
  Filled 2018-01-08 (×2): qty 2

## 2018-01-08 MED ORDER — PROPOFOL 10 MG/ML IV BOLUS
INTRAVENOUS | Status: DC | PRN
  Administered 2018-01-08: 10 mg via INTRAVENOUS
  Administered 2018-01-08: 20 mg via INTRAVENOUS
  Administered 2018-01-08 (×2): 10 mg via INTRAVENOUS
  Administered 2018-01-08: 20 mg via INTRAVENOUS
  Administered 2018-01-08: 10 mg via INTRAVENOUS
  Administered 2018-01-08: 20 mg via INTRAVENOUS
  Administered 2018-01-08 (×2): 10 mg via INTRAVENOUS

## 2018-01-08 MED ORDER — TRAZODONE HCL 50 MG PO TABS: 50.0000 mg | ORAL_TABLET | Freq: Every evening | ORAL | Status: DC | PRN

## 2018-01-08 MED ORDER — ALBUTEROL SULFATE HFA 108 (90 BASE) MCG/ACT IN AERS: 2.0000 | INHALATION_SPRAY | Freq: Four times a day (QID) | RESPIRATORY_TRACT | Status: DC | PRN

## 2018-01-08 MED ORDER — HYDROCORTISONE ACETATE 25 MG RE SUPP: 25.0000 mg | Freq: Two times a day (BID) | RECTAL | 0 refills | Status: DC

## 2018-01-08 MED ORDER — MAGNESIUM HYDROXIDE 400 MG/5ML PO SUSP: 30.0000 mL | Freq: Every day | ORAL | Status: DC | PRN

## 2018-01-08 MED ORDER — PROPOFOL 10 MG/ML IV BOLUS
INTRAVENOUS | Status: AC
  Filled 2018-01-08: qty 40

## 2018-01-08 MED ORDER — LEVETIRACETAM 500 MG PO TABS
1000.0000 mg | ORAL_TABLET | Freq: Two times a day (BID) | ORAL | Status: DC
  Administered 2018-01-08 – 2018-01-13 (×10): 1000 mg via ORAL
  Filled 2018-01-08 (×2): qty 2
  Filled 2018-01-08: qty 56
  Filled 2018-01-08: qty 2
  Filled 2018-01-08: qty 56
  Filled 2018-01-08: qty 2
  Filled 2018-01-08: qty 56
  Filled 2018-01-08 (×7): qty 2
  Filled 2018-01-08: qty 56
  Filled 2018-01-08 (×2): qty 2

## 2018-01-08 MED ORDER — ONDANSETRON HCL 4 MG/2ML IJ SOLN: 4.0000 mg | Freq: Once | INTRAMUSCULAR | Status: DC | PRN

## 2018-01-08 MED ORDER — HYDROXYZINE HCL 25 MG PO TABS
25.0000 mg | ORAL_TABLET | Freq: Three times a day (TID) | ORAL | Status: DC | PRN
  Administered 2018-01-08 – 2018-01-09 (×3): 25 mg via ORAL
  Filled 2018-01-08 (×3): qty 1

## 2018-01-08 MED ORDER — ALUM & MAG HYDROXIDE-SIMETH 200-200-20 MG/5ML PO SUSP: 30.0000 mL | ORAL | Status: DC | PRN

## 2018-01-08 MED ORDER — SODIUM CHLORIDE 0.9 % IV SOLN
INTRAVENOUS | Status: DC
  Administered 2018-01-08: 08:00:00 via INTRAVENOUS

## 2018-01-08 MED ORDER — ALBUTEROL SULFATE HFA 108 (90 BASE) MCG/ACT IN AERS
2.0000 | INHALATION_SPRAY | Freq: Four times a day (QID) | RESPIRATORY_TRACT | Status: DC | PRN
  Filled 2018-01-08: qty 6.7

## 2018-01-08 MED ORDER — PROPOFOL 500 MG/50ML IV EMUL
INTRAVENOUS | Status: DC | PRN
  Administered 2018-01-08: 120 ug/kg/min via INTRAVENOUS

## 2018-01-08 SURGICAL SUPPLY — 22 items

## 2018-01-08 NOTE — Op Note (Signed)
Colonoscopy was performed for diarrhea,rectal bleeding and abnormal CAT scan.  Findings: External hemorrhoids and skin tags on perianal exam. A small sigmoid polyp, removed via biopsy polypectomy. Normal-appearing terminal ileum. Normal-appearing entire colonic mucosa, biopsies taken from random areas of colon to rule out microscopic colitis. Hemorrhoids noted on retroflexion.  Recommendations: Resume regular diet. Await pathology report. Reassurance. Repeat colonoscopy for surveillance depending upon pathology report. GI will sign off.   [name], M.D.
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
## 2018-01-08 NOTE — Anesthesia Procedure Notes (Signed)
Procedure Name: MAC Date/Time: 01/08/2018 9:42 AM Performed by: West Stankovich, CRNA Pre-anesthesia Checklist: Patient identified, Emergency Drugs available, Suction available, Patient being monitored and Timeout performed Patient Re-evaluated:Patient Re-evaluated prior to induction Oxygen Delivery Method: Nasal cannula Preoxygenation: Pre-oxygenation with 100% oxygen Induction Type: IV induction Dental Injury: Teeth and Oropharynx as per pre-operative assessment

## 2018-01-08 NOTE — Anesthesia Postprocedure Evaluation (Signed)
Anesthesia Post Note  Patient: Kristen Roberson  Procedure(s) Performed: COLONOSCOPY WITH PROPOFOL (N/A ) BIOPSY     Patient location during evaluation: PACU Anesthesia Type: MAC Level of consciousness: awake and alert and oriented Pain management: pain level controlled Vital Signs Assessment: post-procedure vital signs reviewed and stable Respiratory status: spontaneous breathing, nonlabored ventilation and respiratory function stable Cardiovascular status: stable and blood pressure returned to baseline Postop Assessment: no apparent nausea or vomiting Anesthetic complications: no    Last Vitals:  Vitals:   01/08/18 1018 01/08/18 1020  BP: 119/65 110/67  Pulse: 86 82  Resp: 15 (!) 22  Temp: 36.4 C   SpO2: 98% 98%    Last Pain:  Vitals:   01/08/18 1020  TempSrc:   PainSc: 5                  [name] A.

## 2018-01-08 NOTE — Progress Notes (Signed)
[facility] able to accept patient  Patient will go to room 306 bed 2.   RN report number: [phone].  Patient is voluntary.   Patient will transport by [facility], Shawna Clamp [place]

## 2018-01-08 NOTE — Addendum Note (Signed)
Addendum  created 01/08/18 1131 by West Pittinger, CRNA   [facility] section accepted

## 2018-01-08 NOTE — Progress Notes (Signed)
C diff negative, GI panel negative.  Enteric precautions discontinued as per protocol.

## 2018-01-08 NOTE — Brief Op Note (Signed)
01/03/2018 - 01/08/2018  10:13 AM  PATIENT:  Acquanetta Belling  42 y.o. female  PRE-OPERATIVE DIAGNOSIS:  rectal bleeding  POST-OPERATIVE DIAGNOSIS:  Random colon biopsies taken to r/o diarrhea  PROCEDURE:  Procedure(s): COLONOSCOPY WITH PROPOFOL (N/A) BIOPSY  SURGEON:  Surgeon(s) and Role:    [name], MD - Primary  PHYSICIAN ASSISTANT:   ASSISTANTS: Zenon Mayo, RN, Charolette Child, Tech ANESTHESIA:   MAC  EBL:  Minimal  BLOOD ADMINISTERED:none  DRAINS: none   LOCAL MEDICATIONS USED:  NONE  SPECIMEN:  Biopsy / Limited Resection  DISPOSITION OF SPECIMEN:  PATHOLOGY  COUNTS:  YES  TOURNIQUET:  * No tourniquets in log *  DICTATION: .Dragon Dictation  PLAN OF CARE: Admit to inpatient   PATIENT DISPOSITION:  PACU - hemodynamically stable.   Delay start of Pharmacological VTE agent (>24hrs) due to surgical blood loss or risk of bleeding: no
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
## 2018-01-08 NOTE — Progress Notes (Signed)
Patient did attend the evening speaker AA meeting.  

## 2018-01-08 NOTE — Interval H&P Note (Signed)
History and Physical Interval Note: 42/female with abdominal pain, diarrhea, rectal bleeding and abnormal CT for a diagnostic colonoscopy.  01/08/2018 9:36 AM  Kristen Roberson  has presented today forcolonoscopy, with the diagnosis of rectal bleeding  The various methods of treatment have been discussed with the patient and family. After consideration of risks, benefits and other options for treatment, the patient has consented to  Procedure(s): COLONOSCOPY WITH PROPOFOL (N/A) as a surgical intervention .  The patient's history has been reviewed, patient examined, no change in status, stable for surgery.  I have reviewed the patient's chart and labs.  Questions were answered to the patient's satisfaction.     [name]

## 2018-01-08 NOTE — Progress Notes (Signed)
LCSW following for inpatient psych placement.   Patient medically stable.   Patient under review at [facility].  LCSW will continue to follow for dc needs.  Carolin Coy [facility] [place]

## 2018-01-08 NOTE — Transfer of Care (Signed)
Immediate Anesthesia Transfer of Care Note  Patient: Kristen Roberson  Procedure(s) Performed: COLONOSCOPY WITH PROPOFOL (N/A ) BIOPSY  Patient Location: PACU  Anesthesia Type:MAC  Level of Consciousness: awake, alert , oriented and patient cooperative  Airway & Oxygen Therapy: Patient Spontanous Breathing and Patient connected to nasal cannula oxygen  Post-op Assessment: Report given to RN and Post -op Vital signs reviewed and stable  Post vital signs: Reviewed and stable  Last Vitals:  Vitals Value Taken Time  BP    Temp    Pulse    Resp 16 01/08/2018 10:19 AM  SpO2    Vitals shown include unvalidated device data.  Last Pain:  Vitals:   01/08/18 0928  TempSrc:   PainSc: 0-No pain      Patients Stated Pain Goal: 2 (31/12/16 2446)  Complications: No apparent anesthesia complications

## 2018-01-08 NOTE — Op Note (Signed)
[facility] Patient Name: Kristen Roberson Procedure Date: 01/08/2018 MRN: [id number] Attending MD: [name] , MD Date of Birth: 08/17/1975 CSN: [id number] Age: 42 Admit Type: Inpatient Procedure:                Colonoscopy Indications:              Last colonoscopy: 2015, Diarrhea, Rectal bleeding,                            Abnormal CT of the GI tract Providers:                [name], MD, Zenon Mayo, RN, Charolette Child,                            Technician, Christell Faith, CRNA Referring MD:              Medicines:                Monitored Anesthesia Care Complications:            No immediate complications. Estimated Blood Loss:     Estimated blood loss was minimal. Procedure:                Pre-Anesthesia Assessment:                           - Prior to the procedure, a History and Physical                            was performed, and patient medications and                            allergies were reviewed. The patient's tolerance of                            previous anesthesia was also reviewed. The risks                            and benefits of the procedure and the sedation                            options and risks were discussed with the patient.                            All questions were answered, and informed consent                            was obtained. Prior Anticoagulants: The patient has                            taken no previous anticoagulant or antiplatelet                            agents. ASA Grade Assessment: III - A patient with  severe systemic disease. After reviewing the risks                            and benefits, the patient was deemed in                            satisfactory condition to undergo the procedure.                           After obtaining informed consent, the colonoscope                            was passed under direct vision. Throughout the                            procedure,  the patient's blood pressure, pulse, and                            oxygen saturations were monitored continuously. The                            EC-[id number] ([id number]) scope was introduced through                            the anus and advanced to the the terminal ileum.                            The colonoscopy was performed without difficulty.                            The patient tolerated the procedure well. The                            quality of the bowel preparation was fair. Scope In: 9:51:13 AM Scope Out: 10:11:20 AM Scope Withdrawal Time: 0 hours 14 minutes 58 seconds  Total Procedure Duration: 0 hours 20 minutes 7 seconds  Findings:      Skin tags and small hemorrhoids were found on perianal exam.      A 6 mm polyp was found in the sigmoid colon. The polyp was sessile. The       polyp was removed with a piecemeal technique using a cold biopsy       forceps. Resection and retrieval were complete.      The terminal ileum appeared normal.      The exam was otherwise without abnormality.      Biopsies for histology were taken with a cold forceps from the random       areas of right and left colon for evaluation of microscopic colitis.      Non-bleeding internal hemorrhoids were found during retroflexion. The       hemorrhoids were small. Impression:               - Preparation of the colon was fair.                           - Perianal skin tags  found on perianal exam.                           - One 6 mm polyp in the sigmoid colon, removed                            piecemeal using a cold biopsy forceps. Resected and                            retrieved.                           - The examined portion of the ileum was normal.                           - The examination was otherwise normal.                           - Non-bleeding internal hemorrhoids.                           - Biopsies were taken with a cold forceps from the                            random areas of  right and left colon for evaluation                            of microscopic colitis. Moderate Sedation:      Patient did not receive moderate sedation for this procedure, but       instead received monitored anesthesia care. Recommendation:           - Repeat colonoscopy for surveillance based on                            pathology results.                           - Resume regular diet.                           - Await pathology results. Procedure Code(s):        --- Professional ---                           [phone], Colonoscopy, flexible; with biopsy, single                            or multiple Diagnosis Code(s):        --- Professional ---                           K64.8, Other hemorrhoids                           D12.5, Benign neoplasm of sigmoid colon  K64.4, Residual hemorrhoidal skin tags                           R19.7, Diarrhea, unspecified                           K62.5, Hemorrhage of anus and rectum                           R93.3, Abnormal findings on diagnostic imaging of                            other parts of digestive tract CPT copyright 2017 [employer]. All rights reserved. The codes documented in this report are preliminary and upon coder review may  be revised to meet current compliance requirements. [name], MD 01/08/2018 10:19:39 AM This report has been signed electronically. Number of Addenda: 0

## 2018-01-09 DIAGNOSIS — F1024 Alcohol dependence with alcohol-induced mood disorder: Secondary | ICD-10-CM

## 2018-01-09 DIAGNOSIS — F1420 Cocaine dependence, uncomplicated: Secondary | ICD-10-CM

## 2018-01-09 DIAGNOSIS — F322 Major depressive disorder, single episode, severe without psychotic features: Secondary | ICD-10-CM

## 2018-01-09 LAB — COMPREHENSIVE METABOLIC PANEL
ALK PHOS: 91 U/L (ref 38–126)
ALT: 46 U/L (ref 14–54)
AST: 126 U/L — AB (ref 15–41)
Albumin: 4.1 g/dL (ref 3.5–5.0)
Anion gap: 7 (ref 5–15)
BUN: 6 mg/dL (ref 6–20)
CALCIUM: 9.2 mg/dL (ref 8.9–10.3)
CHLORIDE: 104 mmol/L (ref 101–111)
CO2: 28 mmol/L (ref 22–32)
CREATININE: 0.57 mg/dL (ref 0.44–1.00)
Glucose, Bld: 95 mg/dL (ref 65–99)
Potassium: 3.8 mmol/L (ref 3.5–5.1)
Sodium: 139 mmol/L (ref 135–145)
Total Bilirubin: 1 mg/dL (ref 0.3–1.2)
Total Protein: 7.6 g/dL (ref 6.5–8.1)

## 2018-01-09 LAB — TSH: TSH: 3.978 u[IU]/mL (ref 0.350–4.500)

## 2018-01-09 MED ORDER — ACAMPROSATE CALCIUM 333 MG PO TBEC
666.0000 mg | DELAYED_RELEASE_TABLET | Freq: Three times a day (TID) | ORAL | Status: DC
  Administered 2018-01-09 – 2018-01-13 (×12): 666 mg via ORAL
  Filled 2018-01-09 (×9): qty 2
  Filled 2018-01-09: qty 84
  Filled 2018-01-09: qty 2
  Filled 2018-01-09: qty 84
  Filled 2018-01-09: qty 2
  Filled 2018-01-09 (×2): qty 84
  Filled 2018-01-09: qty 2
  Filled 2018-01-09 (×2): qty 84
  Filled 2018-01-09: qty 2

## 2018-01-09 MED ORDER — TRAZODONE HCL 100 MG PO TABS
100.0000 mg | ORAL_TABLET | Freq: Every evening | ORAL | Status: DC | PRN
  Administered 2018-01-09 – 2018-01-12 (×4): 100 mg via ORAL
  Filled 2018-01-09 (×3): qty 1
  Filled 2018-01-09: qty 14
  Filled 2018-01-09: qty 1

## 2018-01-09 MED ORDER — HYDROXYZINE HCL 50 MG PO TABS
50.0000 mg | ORAL_TABLET | Freq: Three times a day (TID) | ORAL | Status: DC | PRN
  Administered 2018-01-09 – 2018-01-13 (×11): 50 mg via ORAL
  Filled 2018-01-09: qty 1
  Filled 2018-01-09: qty 20
  Filled 2018-01-09 (×10): qty 1
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
## 2018-01-09 NOTE — Progress Notes (Signed)
D. Pt presents with a sad affect congruent with mood. Pt has been calm and cooperative, pleasant during interactions- observed interacting well with peers in dayroom.  Per pt's self inventory, pt rates her depression, hopelessness and anxiety an 8/8/9, respectively. Pt currently denies SI/HI and AV hallucinations, but endorses passive SI during the night- reports having thoughts of "not wanting to wake up". Pt writes that her most important goal today to work on is her "depression" and she will take "one minute at a time". A. Labs and vitals monitored. Pt given scheduled meds and prn meds for abdominal discomfort and increasing anxiety . Pt supported emotionally and encouraged to express concerns and ask questions.   R. Pt remains safe with 15 minute checks. Will continue POC.

## 2018-01-09 NOTE — BHH Group Notes (Signed)
[facility] Group Notes:  (Nursing)  Date:  01/09/2018  Time:  1:15 PM Type of Therapy:  Nurse Education  Participation Level:  Active  Participation Quality:  Appropriate  Affect:  Appropriate  Cognitive:  Alert and Appropriate  Insight:  Appropriate  Engagement in Group:  Engaged  Modes of Intervention:  Discussion, Education and Exploration  Summary of Progress/Problems: Group played a non competitive learning/communication board game that fosters listening skills as well as self expression  Waymond Cera 01/09/2018, 3:10 PM
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
## 2018-01-09 NOTE — BHH Suicide Risk Assessment (Signed)
[facility] INPATIENT:  Family/Significant Other Suicide Prevention Education  Suicide Prevention Education:  Patient Refusal for Family/Significant Other Suicide Prevention Education: The patient Kristen Roberson has refused to provide written consent for family/significant other to be provided Family/Significant Other Suicide Prevention Education during admission and/or prior to discharge.  Physician notified.  Suicide Prevention Education was reviewed thoroughly with patient, including risk factors, warning signs, and what to do.  Mobile Crisis services were described and that telephone number pointed out, with encouragement to patient to put this number in personal cell phone.  Brochure was provided to patient to share with natural supports.  Patient acknowledged the ways in which they are at risk, and how working through each of their issues can gradually start to reduce their risk factors.  Patient was encouraged to think of the information in the context of people in their own lives.  Patient denied having access to firearms  Patient verbalized understanding of information provided.  Patient endorsed a desire to get to the point where she will want to live.      Berlin Hun Grossman-Orr 01/09/2018, 8:59 AM
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
## 2018-01-09 NOTE — Progress Notes (Signed)
Pt attend goals and orientation group this morning. Pt goal for the day is to get through the day

## 2018-01-09 NOTE — [facility] Group Notes (Signed)
[facility] LCSW Group Therapy Note  01/09/2018  10:00-11:00AM  Type of Therapy and Topic:  Group Therapy:  Being Your Own Support  Participation Level:  Active   Description of Group:  Patients in this group were introduced to the concept that self-support is an essential part of recovery.  A song entitled "My Own Hero" was played and a group discussion ensued in which patients stated they could relate to the song and it inspired them to realize they have be willing to help themselves in order to succeed, because other people cannot achieve sobriety or stability for them.  We discussed adding a variety of healthy supports to address the various needs in their lives.  A song was played called "I Know Where I've Been" toward the end of group and used to conduct an inspirational wrap-up to group of remembering how far they have already come in their journey.  Therapeutic Goals: 1)  demonstrate the importance of being a part of one's own support system 2)  discuss reasons people in one's life may eventually be unable to be continually supportive  3)  identify the patient's current support system and   4)  elicit commitments to add healthy supports and to become more conscious of being self-supportive   Summary of Patient Progress:  The patient expressed that she has "awful" supports in her life right now, among which she admitted is herself.  She was supportive of others throughout group.  She acknowledged the need to be her own hero, while also acknowledged one cannot do this alone.   Therapeutic Modalities:   Motivational Interviewing Activity  Maretta Los

## 2018-01-10 ENCOUNTER — Encounter (HOSPITAL_COMMUNITY): Payer: Self-pay | Admitting: Gastroenterology

## 2018-01-10 LAB — CBC WITH DIFFERENTIAL/PLATELET
Basophils Absolute: 0.1 10*3/uL (ref 0.0–0.1)
Basophils Relative: 2 %
EOS PCT: 3 %
Eosinophils Absolute: 0.1 10*3/uL (ref 0.0–0.7)
HEMATOCRIT: 36.3 % (ref 36.0–46.0)
Hemoglobin: 11 g/dL — ABNORMAL LOW (ref 12.0–15.0)
Lymphocytes Relative: 31 %
Lymphs Abs: 1 10*3/uL (ref 0.7–4.0)
MCH: 24.4 pg — AB (ref 26.0–34.0)
MCHC: 30.3 g/dL (ref 30.0–36.0)
MCV: 80.5 fL (ref 78.0–100.0)
Monocytes Absolute: 0.6 10*3/uL (ref 0.1–1.0)
Monocytes Relative: 17 %
NEUTROS PCT: 47 %
Neutro Abs: 1.6 10*3/uL — ABNORMAL LOW (ref 1.7–7.7)
Platelets: 175 10*3/uL (ref 150–400)
RBC: 4.51 MIL/uL (ref 3.87–5.11)
RDW: 23.8 % — AB (ref 11.5–15.5)
WBC: 3.3 10*3/uL — AB (ref 4.0–10.5)

## 2018-01-10 LAB — HEPATIC FUNCTION PANEL
ALT: 54 U/L (ref 14–54)
AST: 156 U/L — ABNORMAL HIGH (ref 15–41)
Albumin: 4.1 g/dL (ref 3.5–5.0)
Alkaline Phosphatase: 84 U/L (ref 38–126)
BILIRUBIN DIRECT: 0.2 mg/dL (ref 0.1–0.5)
BILIRUBIN TOTAL: 0.5 mg/dL (ref 0.3–1.2)
Indirect Bilirubin: 0.3 mg/dL (ref 0.3–0.9)
Total Protein: 7.5 g/dL (ref 6.5–8.1)
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
## 2018-01-10 NOTE — Progress Notes (Signed)
Adult Psychoeducational Group Note  Date:  01/10/2018 Time:  9:09 AM  Group Topic/Focus:  Orientation:   The focus of this group is to educate the patient on the purpose and policies of crisis stabilization and provide a format to answer questions about their admission.  The group details unit policies and expectations of patients while admitted.  Participation Level:  Active  Participation Quality:  Appropriate  Affect:  Appropriate  Cognitive:  Appropriate  Insight: Appropriate  Engagement in Group:  Engaged  Modes of Intervention:  Discussion  Additional Comments:  Pt attended group. Pt talked about their goal, Pt talked about the schedule, Introduced the staff, discussed expectations and talked about being supportive to one another.  Kristen Roberson 01/10/2018, 9:09 AM

## 2018-01-10 NOTE — Tx Team (Signed)
Interdisciplinary Treatment and Diagnostic Plan Update  01/10/2018 Time of Session: 0830AM Kristen Roberson MRN: [id number]  Principal Diagnosis: MDD (major depressive disorder), severe [name]  Secondary Diagnoses: Principal Problem:   MDD (major depressive disorder), severe (Kristen Roberson) Active Problems:   Moderate cocaine use disorder (HCC)   Current Medications:  Current Facility-Administered Medications  Medication Dose Route Frequency Provider Last Rate Last Dose  . acamprosate (CAMPRAL) tablet 666 mg  666 mg Oral TID WC Money, Lowry Ram, FNP   666 mg at 01/10/18 1062  . acetaminophen (TYLENOL) tablet 650 mg  650 mg Oral Q6H PRN Money, Lowry Ram, FNP   650 mg at 01/09/18 0756  . albuterol (PROVENTIL HFA;VENTOLIN HFA) 108 (90 Base) MCG/ACT inhaler 2 puff  2 puff Inhalation Q6H PRN Patriciaann Clan E, PA-C      . alum & mag hydroxide-simeth (MAALOX/MYLANTA) 200-200-20 MG/5ML suspension 30 mL  30 mL Oral Q4H PRN Money, Lowry Ram, FNP      . FLUoxetine (PROZAC) capsule 40 mg  40 mg Oral Daily Laverle Hobby, PA-C   40 mg at 01/10/18 0802  . hydrOXYzine (ATARAX/VISTARIL) tablet 50 mg  50 mg Oral TID PRN Sharma Covert, MD   50 mg at 01/10/18 6948  . levETIRAcetam (KEPPRA) tablet 1,000 mg  1,000 mg Oral BID Laverle Hobby, PA-C   1,000 mg at 01/10/18 0802  . magnesium hydroxide (MILK OF MAGNESIA) suspension 30 mL  30 mL Oral Daily PRN Money, Lowry Ram, FNP      . traZODone (DESYREL) tablet 100 mg  100 mg Oral QHS PRN Money, Lowry Ram, FNP   100 mg at 01/09/18 2157   PTA Medications: Medications Prior to Admission  Medication Sig Dispense Refill Last Dose  . albuterol (PROVENTIL HFA;VENTOLIN HFA) 108 (90 Base) MCG/ACT inhaler Inhale 2 puffs into the lungs every 6 (six) hours as needed for wheezing or shortness of breath.   unknown  . FLUoxetine (PROZAC) 40 MG capsule Take 1 capsule (40 mg total) by mouth daily. For depression 30 capsule 0 Past Week at Unknown time  . hydrocortisone (ANUSOL-HC) 25 MG  suppository Place 1 suppository (25 mg total) rectally 2 (two) times daily. 12 suppository 0   . hydrOXYzine (ATARAX/VISTARIL) 25 MG tablet Take 1 tablet (25 mg) by mouth four times daily as needed: For anxiety (Patient not taking: Reported on 11/09/2017) 60 tablet 0 Completed Course at Unknown time  . levETIRAcetam (KEPPRA) 1000 MG tablet Take 1 tablet (1,000 mg total) by mouth 2 (two) times daily. For seizure activities 60 tablet 0 12/31/2017  . traZODone (DESYREL) 50 MG tablet Take 1 tablet (50 mg total) by mouth at bedtime as needed for sleep. 30 tablet 0 Past Week at Unknown time    Patient Stressors: Financial difficulties Loss of home  Patient Strengths: Ability for insight Average or above average intelligence Communication skills Motivation for treatment/growth  Treatment Modalities: Medication Management, Group therapy, Case management,  1 to 1 session with clinician, Psychoeducation, Recreational therapy.   Physician Treatment Plan for Primary Diagnosis: MDD (major depressive disorder), severe (Kristen Roberson) Long Term Goal(s): Improvement in symptoms so as ready for discharge Improvement in symptoms so as ready for discharge   Short Term Goals: Ability to identify changes in lifestyle to reduce recurrence of condition will improve Ability to verbalize feelings will improve Ability to disclose and discuss suicidal ideas Ability to demonstrate self-control will improve Ability to identify and develop effective coping behaviors will improve Ability to maintain clinical  measurements within normal limits will improve Compliance with prescribed medications will improve Ability to identify triggers associated with substance abuse/mental health issues will improve  Medication Management: Evaluate patient's response, side effects, and tolerance of medication regimen.  Therapeutic Interventions: 1 to 1 sessions, Unit Group sessions and Medication administration.  Evaluation of Outcomes:  Progressing  Physician Treatment Plan for Secondary Diagnosis: Principal Problem:   MDD (major depressive disorder), severe (Kristen Roberson) Active Problems:   Moderate cocaine use disorder (HCC)  Long Term Goal(s): Improvement in symptoms so as ready for discharge Improvement in symptoms so as ready for discharge   Short Term Goals: Ability to identify changes in lifestyle to reduce recurrence of condition will improve Ability to verbalize feelings will improve Ability to disclose and discuss suicidal ideas Ability to demonstrate self-control will improve Ability to identify and develop effective coping behaviors will improve Ability to maintain clinical measurements within normal limits will improve Compliance with prescribed medications will improve Ability to identify triggers associated with substance abuse/mental health issues will improve     Medication Management: Evaluate patient's response, side effects, and tolerance of medication regimen.  Therapeutic Interventions: 1 to 1 sessions, Unit Group sessions and Medication administration.  Evaluation of Outcomes: Progressing   RN Treatment Plan for Primary Diagnosis: MDD (major depressive disorder), severe (Kristen Roberson) Long Term Goal(s): Knowledge of disease and therapeutic regimen to maintain health will improve  Short Term Goals: Ability to remain free from injury will improve, Ability to disclose and discuss suicidal ideas and Ability to identify and develop effective coping behaviors will improve  Medication Management: RN will administer medications as ordered by provider, will assess and evaluate patient's response and provide education to patient for prescribed medication. RN will report any adverse and/or side effects to prescribing provider.  Therapeutic Interventions: 1 on 1 counseling sessions, Psychoeducation, Medication administration, Evaluate responses to treatment, Monitor vital signs and CBGs as ordered, Perform/monitor CIWA,  COWS, AIMS and Fall Risk screenings as ordered, Perform wound care treatments as ordered.  Evaluation of Outcomes: Progressing   LCSW Treatment Plan for Primary Diagnosis: MDD (major depressive disorder), severe (Kristen Roberson) Long Term Goal(s): Safe transition to appropriate next level of care at discharge, Engage patient in therapeutic group addressing interpersonal concerns.  Short Term Goals: Engage patient in aftercare planning with referrals and resources, Facilitate patient progression through stages of change regarding substance use diagnoses and concerns and Identify triggers associated with mental health/substance abuse issues  Therapeutic Interventions: Assess for all discharge needs, 1 to 1 time with Social worker, Explore available resources and support systems, Assess for adequacy in community support network, Educate family and significant other(s) on suicide prevention, Complete Psychosocial Assessment, Interpersonal group therapy.  Evaluation of Outcomes: Progressing   Progress in Treatment: Attending groups: Yes. Participating in groups: Yes. Taking medication as prescribed: Yes. Toleration medication: Yes. Family/Significant other contact made: SPE completed with pt; pt declined to consent to collateral contact.  Patient understands diagnosis: Yes. Discussing patient identified problems/goals with staff: Yes. Medical problems stabilized or resolved: Yes. Denies suicidal/homicidal ideation: Yes, per self report.  Issues/concerns per patient self-inventory: No. and As evidenced by:  n/a Other: n/a  New problem(s) identified: No, Describe:  n/a  New Short Term/Long Term Goal(s): detox, medication management for mood stabilization; elimination of SI thoughts; development of comprehensive mental wellness/sobriety plan.   Patient Goals:  "To get my mind straight and find some support in the community."   Discharge Plan or Barriers: CSW assessing for appropriate referrals. Pt has  [facility] appt scheduled with Grover Canavan at discharge. [place] list provided to pt. CSW exploring residential options with pt. [facility] pamphlet, [facility] information, and AA/NA information provided to patient for additional community support and resources.   Reason for Continuation of Hospitalization: Anxiety Depression Medication stabilization Suicidal ideation Withdrawal symptoms  Estimated Length of Stay: Thursday, 01/13/18  Attendees: Patient: Tynetta Bachmann 01/10/2018 9:57 AM  Physician: Queen Blossom MD; Dr. Nancy Fetter MD 01/10/2018 9:57 AM  Nursing: Clarise Cruz RN; Sharyn Lull RN 01/10/2018 9:57 AM  RN Care Manager:X 01/10/2018 9:57 AM  Social Worker: Janice Norrie LCSW 01/10/2018 9:57 AM  Recreational Therapist: x 01/10/2018 9:57 AM  Other: Lindell Spar NP; Ricky Ala NP 01/10/2018 9:57 AM  Other:  01/10/2018 9:57 AM  Other: 01/10/2018 9:57 AM    Scribe for Treatment Team: Avelina Laine, LCSW 01/10/2018 9:57 AM

## 2018-01-10 NOTE — Progress Notes (Signed)
Recreation Therapy Notes  Date: 6.24.19 Time: 0930 Location: [address] Dayroom  Group Topic: Stress Management  Goal Area(s) Addresses:  Patient will verbalize importance of using healthy stress management.  Patient will identify positive emotions associated with healthy stress management.   Behavioral Response: Engaged  Intervention: Stress Management  Activity :  Guided Imagery.  LRT introduced the stress management technique of guided imagery to the group.  LRT read a script that took the patients on a journey to the beach.  Patients were to follow along as the script was read to engage in the activity.  Education:  Stress Management, Discharge Planning.   Education Outcome: Acknowledges edcuation/In group clarification offered/Needs additional education  Clinical Observations/Feedback: Pt attended and participated in group.     Victorino Sparrow, LRT/CTRS         Ria Comment, Birch Farino A 01/10/2018 11:20 AM

## 2018-01-10 NOTE — Progress Notes (Signed)
Patient ID: Kristen Roberson, female   DOB: 02/22/1976, 42 y.o.   MRN: [id number] D) Pt mood and affect anxious, depressed, distracted. Pt distressed regarding her cat and her belongings. Pt is cooperative on approach. Positive for groups and activities with minimal prompting. Pt is working on finding a "place to live" on discharge. Poor insight and judgement. Contracts for safety. A) Level 3 obs for safety, support and encouragement provided. Med ed reinforced. R) Cooperative.

## 2018-01-10 NOTE — Progress Notes (Signed)
[facility] MD Progress Note  01/10/2018 11:30 AM Kristen Roberson  MRN:  [id number] Subjective: Patient is seen and examined.  Patient's 42 year old female with a past psychiatric history significant for major depression, alcohol use disorder/alcohol dependence, and grief issues.  She is seen in follow-up.  She was admitted on 6/23.  She states she is essentially unchanged.  She stated that she did sleep better last night.  Her mood is essentially the same.  She stated she is looking forward to getting additional treatment for her alcoholism.  She wants to go to some form of a residential program, but does not want to return to the same one that she had been at.  She still irritable about the cocaine issue.  She denied ever having used cocaine in the past.  She stated she did not use cocaine.  Her CBC from this morning shows that her hemoglobin is 11, and her hematocrit is 36.3.  Both of these are stable from the test 3 days ago.  She does not appear to still be bleeding.  Her magnesium was a bit low at 1.6 on 6/20.  I will supplement that today.  Her AST was significantly elevated at transfer 126, and it has continued to climb at 156 today.  Her ALT went from 46-54.  Vital signs are stable, she is afebrile. Principal Problem: MDD (major depressive disorder), severe ([place]) Diagnosis:   Patient Active Problem List   Diagnosis Date Noted  . Moderate cocaine use disorder ([place]) [F14.20]   . MDD (major depressive disorder), severe ([place]) [F32.2] 01/08/2018  . Suicidal ideation [R45.851]   . Microcytic hypochromic anemia [D50.9] 01/04/2018  . Colitis with rectal bleeding [K52.9, K62.5] 01/03/2018  . MDD (major depressive disorder), recurrent severe, without psychosis ([place]) [F33.2] 10/26/2017  . UTI (urinary tract infection) [N39.0] 09/20/2017  . MDD (major depressive disorder) [F32.9] 01/16/2017  . Alcohol dependence with withdrawal, uncomplicated ([place]) [K93.267] 03/17/2015  . Hepatic encephalopathy ([place]) [K72.90]  02/23/2015  . Seizure ([place]) [R56.9] 02/23/2015  . Pancytopenia ([place]) [T24.580] 02/23/2015  . Internal hemorrhoids [K64.8] 02/23/2015  . Alcohol intoxication ([place]) [F10.929]   . Hemorrhoid [K64.9]   . C. difficile colitis [A04.72]   . Alcohol abuse [F10.10]   . Elevated liver enzymes [R74.8]   . Colitis [K52.9] 12/12/2014  . Abrasion of wrist [S60.819A]   . Alcohol dependence with uncomplicated withdrawal ([place]) [F10.230]   . GAD (generalized anxiety disorder) [F41.1] 07/01/2014  . Alcohol withdrawal syndrome without complication ([place]) [D98.338] 06/30/2014  . MDD (major depressive disorder), recurrent episode, moderate ([place]) [F33.1]   . Alcohol use disorder, severe, dependence ([place]) [F10.20]   . Alcohol dependence with withdrawal with complication ([place]) [S50.539] 05/19/2014  . Thrombocytopenia ([place]) [D69.6] 05/07/2014  . Hypokalemia [E87.6] 05/07/2014  . Right sided weakness [R53.1] 04/15/2014  . Seizures ([place]) [R56.9] 04/04/2014  . Substance induced mood disorder ([place]) [F19.94] 03/26/2014  . Status post myocardial infarction [I25.2] 03/06/2014  . Depression with suicidal ideation [F32.9, R45.851] 03/06/2014  . Polysubstance abuse ([place]) [F19.10] 02/22/2014  . Acute respiratory failure with hypoxia ([place]) [J96.01] 02/22/2014  . Ventricular fibrillation ([place]) [I49.01] 02/22/2014  . Acute systolic heart failure - s/p VF Cardiac Arrest [I50.21] 02/22/2014  . Acute encephalopathy [G93.40] 02/22/2014  . Acute confusional state [F05] 02/22/2014  . Moderate malnutrition ([place]) [E44.0] 02/21/2014  . Convulsions/seizures ([place]) [R56.9] 02/14/2014  . Cardiac arrest ([place]) [I46.9] 02/13/2014  . Alcohol dependence ([place]) [F10.20] 01/06/2014  . Severe alcohol use disorder ([place]) [F10.20] 01/06/2014  .  Adjustment disorder with depressed mood [F43.21] 12/14/2013  . 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 Folate deficiency [E53.8] 12/11/2013  . Alcoholism ([place]) [F10.20] 12/11/2013   . Alcohol withdrawal ([place]) [F10.239] 12/11/2013  . Hypokalemia [E87.6] 12/11/2013  . Hypomagnesemia [E83.42] 12/11/2013  . Malnutrition of moderate degree ([place]) [E44.0] 12/10/2013  . Abdominal pain [R10.9] 11/25/2013  . Mallory-Weiss tear [K22.6] 11/25/2013  . Acute blood loss anemia [D62] 11/24/2013  . Hematemesis [K92.0] 11/23/2013  . Fatty liver [K76.0] 10/29/2013  . Internal hemorrhoids with other complication [S01.0] 93/23/5573  . Hematochezia [K92.1] 10/28/2013  . Normocytic anemia [D64.9] 10/28/2013  . GIB (gastrointestinal bleeding) [K92.2] 10/28/2013  . Anxiety and depression [F41.9, F32.9]   . Post-operative state [Z98.890] 09/29/2013  . Postoperative state [Z98.890] 09/28/2013  . Female pelvic pain [R10.2] 09/21/2013  . Menorrhagia [N92.0] 09/21/2013  . History of ovarian cyst [Z87.42] 09/21/2013  . 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 Dysmenorrhea [N94.6] 09/21/2013   Total Time spent with patient: 20 minutes  Past Psychiatric History: See admission H&P  Past Medical History:  Past Medical History:  Diagnosis Date  . Alcohol abuse   . Anemia   . Anxiety   . Blood transfusion without reported diagnosis   . Cardiac arrest ([place])   . Cysts of both ovaries   . Depression   . Fatty liver 10/05/13  . Proctitis   . Seizures ([place])     Past Surgical History:  Procedure Laterality Date  . APPENDECTOMY    . BIOPSY  01/08/2018   Procedure: BIOPSY;  Surgeon: [name], MD;  Location: [facility];  Service: Gastroenterology;;  . colitis    . COLONOSCOPY N/A 09/30/2013   Procedure: COLONOSCOPY;  Surgeon: Lafayette Dragon, MD;  Location: [facility];  Service: Endoscopy;  Laterality: N/A;  . COLONOSCOPY WITH PROPOFOL N/A 01/08/2018   Procedure: COLONOSCOPY WITH PROPOFOL;  Surgeon: [name], MD;  Location: [facility];  Service: Gastroenterology;  Laterality: N/A;  . ESOPHAGOGASTRODUODENOSCOPY N/A 11/23/2013   Procedure: ESOPHAGOGASTRODUODENOSCOPY (EGD);  Surgeon: Jerene Bears, MD;  Location: [facility];  Service: Endoscopy;  Laterality: N/A;  . LAPAROSCOPIC APPENDECTOMY Right 09/28/2013   Procedure: APPENDECTOMY LAPAROSCOPIC;  Surgeon: Terrance Mass, MD;  Location: [facility];  Service: Gynecology;  Laterality: Right;  . LAPAROSCOPY N/A 09/28/2013   Procedure: LAPAROSCOPY OPERATIVE;  Surgeon: Terrance Mass, MD;  Location: [facility];  Service: Gynecology;  Laterality: N/A;  . LEFT AND RIGHT HEART CATHETERIZATION WITH CORONARY ANGIOGRAM N/A 02/23/2014   Procedure: LEFT AND RIGHT HEART CATHETERIZATION WITH CORONARY ANGIOGRAM;  Surgeon: Leonie Man, MD;  Location: [facility];  Service: Cardiovascular;  Laterality: N/A;  . OVARIAN CYST REMOVAL    . SALPINGOOPHORECTOMY Right 09/28/2013   Procedure: SALPINGO OOPHORECTOMY;  Surgeon: Terrance Mass, MD;  Location: [facility];  Service: Gynecology;  Laterality: Right;   Family History:  Family History  Problem Relation Age of Onset  . Diabetes Mother   . Hyperlipidemia Mother   . Stroke Mother   . Diabetes Father    Family Psychiatric  History: See admission H&P Social History:  Social History   Substance and Sexual Activity  Alcohol Use Yes   Comment: States she does not drink anymore     Social History   Substance and Sexual Activity  Drug Use No    Social History   Socioeconomic History  . Marital status: Divorced    Spouse name: Not on file  . Number of children: Not on file  .  Years of education: Not on file  . Highest education level: Not on file  Occupational History  . Not on file  Social Needs  . Financial resource strain: Not on file  . Food insecurity:    Worry: Not on file    Inability: Not on file  . Transportation needs:    Medical: Not on file    Non-medical: Not on file  Tobacco Use  . Smoking status: Never Smoker  . Smokeless tobacco: Never Used  Substance and Sexual Activity  . Alcohol use: Yes    Comment: States she does not drink anymore  . Drug use: No  . Sexual  activity: Not on file  Lifestyle  . Physical activity:    Days per week: Not on file    Minutes per session: Not on file  . Stress: Not on file  Relationships  . Social connections:    Talks on phone: Not on file    Gets together: Not on file    Attends religious service: Not on file    Active member of club or organization: Not on file    Attends meetings of clubs or organizations: Not on file    Relationship status: Not on file  Other Topics Concern  . Not on file  Social History Narrative  . Not on file   Additional Social History:                         Sleep: Fair  Appetite:  Good  Current Medications: Current Facility-Administered Medications  Medication Dose Route Frequency Provider Last Rate Last Dose  . acamprosate (CAMPRAL) tablet 666 mg  666 mg Oral TID WC Money, Lowry Ram, FNP   666 mg at 01/10/18 2423  . acetaminophen (TYLENOL) tablet 650 mg  650 mg Oral Q6H PRN Money, Lowry Ram, FNP   650 mg at 01/09/18 0756  . albuterol (PROVENTIL HFA;VENTOLIN HFA) 108 (90 Base) MCG/ACT inhaler 2 puff  2 puff Inhalation Q6H PRN Patriciaann Clan E, PA-C      . alum & mag hydroxide-simeth (MAALOX/MYLANTA) 200-200-20 MG/5ML suspension 30 mL  30 mL Oral Q4H PRN Money, Lowry Ram, FNP      . FLUoxetine (PROZAC) capsule 40 mg  40 mg Oral Daily Laverle Hobby, PA-C   40 mg at 01/10/18 0802  . hydrOXYzine (ATARAX/VISTARIL) tablet 50 mg  50 mg Oral TID PRN Sharma Covert, MD   50 mg at 01/10/18 5361  . levETIRAcetam (KEPPRA) tablet 1,000 mg  1,000 mg Oral BID Laverle Hobby, PA-C   1,000 mg at 01/10/18 0802  . magnesium hydroxide (MILK OF MAGNESIA) suspension 30 mL  30 mL Oral Daily PRN Money, Lowry Ram, FNP      . traZODone (DESYREL) tablet 100 mg  100 mg Oral QHS PRN Money, Lowry Ram, FNP   100 mg at 01/09/18 2157    Lab Results:  Results for orders placed or performed during the hospital encounter of 01/08/18 (from the past 48 hour(s))  Comprehensive metabolic panel      Status: Abnormal   Collection Time: 01/09/18  6:17 AM  Result Value Ref Range   Sodium 139 135 - 145 mmol/L   Potassium 3.8 3.5 - 5.1 mmol/L   Chloride 104 101 - 111 mmol/L   CO2 28 22 - 32 mmol/L   Glucose, Bld 95 65 - 99 mg/dL   BUN 6 6 - 20 mg/dL   Creatinine, Ser 0.57  0.44 - 1.00 mg/dL   Calcium 9.2 8.9 - 10.3 mg/dL   Total Protein 7.6 6.5 - 8.1 g/dL   Albumin 4.1 3.5 - 5.0 g/dL   AST 126 (H) 15 - 41 U/L   ALT 46 14 - 54 U/L   Alkaline Phosphatase 91 38 - 126 U/L   Total Bilirubin 1.0 0.3 - 1.2 mg/dL   GFR calc non Af Amer >60 >60 mL/min   GFR calc Af Amer >60 >60 mL/min    Comment: (NOTE) The eGFR has been calculated using the CKD EPI equation. This calculation has not been validated in all clinical situations. eGFR's persistently <60 mL/min signify possible Chronic Kidney Disease.    Anion gap 7 5 - 15    Comment: Performed at [facility], [place] [address], [place], [place] [address]  TSH     Status: None   Collection Time: 01/09/18  6:17 AM  Result Value Ref Range   TSH 3.978 0.350 - 4.500 uIU/mL    Comment: Performed by a 3rd Generation assay with a functional sensitivity of <=0.01 uIU/mL. Performed at [facility], [place] [address], [place], [place] [address]   CBC with Differential/Platelet     Status: Abnormal   Collection Time: 01/10/18  6:35 AM  Result Value Ref Range   WBC 3.3 (L) 4.0 - 10.5 K/uL   RBC 4.51 3.87 - 5.11 MIL/uL   Hemoglobin 11.0 (L) 12.0 - 15.0 g/dL   HCT 36.3 36.0 - 46.0 %   MCV 80.5 78.0 - 100.0 fL   MCH 24.4 (L) 26.0 - 34.0 pg   MCHC 30.3 30.0 - 36.0 g/dL   RDW 23.8 (H) 11.5 - 15.5 %   Platelets 175 150 - 400 K/uL   Neutrophils Relative % 47 %   Neutro Abs 1.6 (L) 1.7 - 7.7 K/uL   Lymphocytes Relative 31 %   Lymphs Abs 1.0 0.7 - 4.0 K/uL   Monocytes Relative 17 %   Monocytes Absolute 0.6 0.1 - 1.0 K/uL   Eosinophils Relative 3 %   Eosinophils Absolute 0.1 0.0 - 0.7 K/uL   Basophils Relative 2 %    Basophils Absolute 0.1 0.0 - 0.1 K/uL   RBC Morphology TARGET CELLS     Comment: Performed at [facility], [place] [address], [place], [place] [address]  Hepatic function panel     Status: Abnormal   Collection Time: 01/10/18  6:35 AM  Result Value Ref Range   Total Protein 7.5 6.5 - 8.1 g/dL   Albumin 4.1 3.5 - 5.0 g/dL   AST 156 (H) 15 - 41 U/L   ALT 54 14 - 54 U/L   Alkaline Phosphatase 84 38 - 126 U/L   Total Bilirubin 0.5 0.3 - 1.2 mg/dL   Bilirubin, Direct 0.2 0.1 - 0.5 mg/dL   Indirect Bilirubin 0.3 0.3 - 0.9 mg/dL    Comment: Performed at [facility], [place] [address], [place], [place] [address]    Blood Alcohol level:  Lab Results  Component Value Date   ETH <10 01/03/2018   ETH 295 (H) 45/09/8880    Metabolic Disorder Labs: Lab Results  Component Value Date   HGBA1C 4.4 12/09/2013   MPG 80 12/09/2013   No results found for: PROLACTIN No results found for: CHOL, TRIG, HDL, CHOLHDL, VLDL, LDLCALC  Physical Findings: AIMS: Facial and Oral Movements Muscles of Facial Expression: None, normal Lips and Perioral Area: None, normal Jaw: None, normal  Tongue: None, normal,Extremity Movements Upper (arms, wrists, hands, fingers): None, normal Lower (legs, knees, ankles, toes): None, normal, Trunk Movements Neck, shoulders, hips: None, normal, Overall Severity Severity of abnormal movements (highest score from questions above): None, normal Incapacitation due to abnormal movements: None, normal Patient's awareness of abnormal movements (rate only patient's report): No Awareness, Dental Status Current problems with teeth and/or dentures?: No Does patient usually wear dentures?: No  CIWA:    COWS:     Musculoskeletal: Strength & Muscle Tone: within normal limits Gait & Station: normal Patient leans: N/A  Psychiatric Specialty Exam: Physical Exam  Nursing note and vitals reviewed. Constitutional: She is oriented to person, place,  and time. She appears well-developed and well-nourished.  HENT:  Head: Normocephalic and atraumatic.  Respiratory: Effort normal.  Neurological: She is alert and oriented to person, place, and time.    ROS  Blood pressure 116/87, pulse 89, temperature 98 F (36.7 C), temperature source Oral, resp. rate 16, height _0  (1.676 m), weight 71.2 kg (157 lb), SpO2 100 %.Body mass index is 25.34 kg/m.  General Appearance: Casual  Eye Contact:  Fair  Speech:  Normal Rate  Volume:  Normal  Mood:  Anxious  Affect:  Congruent  Thought Process:  Coherent  Orientation:  Full (Time, Place, and Person)  Thought Content:  Logical  Suicidal Thoughts:  No  Homicidal Thoughts:  No  Memory:  Immediate;   Fair Recent;   Fair Remote;   Fair  Judgement:  Intact  Insight:  Lacking  Psychomotor Activity:  Normal  Concentration:  Concentration: Fair and Attention Span: Fair  Recall:  [employer] of Knowledge:  Fair  Language:  Good  Akathisia:  Negative  Handed:  Right  AIMS (if indicated):     Assets:  Communication Skills Desire for Improvement Physical Health Resilience  ADL's:  Intact  Cognition:  WNL  Sleep:  Number of Hours: 5.5     Treatment Plan Summary: Daily contact with patient to assess and evaluate symptoms and progress in treatment, Medication management and Plan Patient is seen and examined.  Patient is a 42 year old female with the above-stated past psychiatric history who seen in follow-up.  She is essentially unchanged from yesterday.  She continues on fluoxetine, hydroxyzine, Keppra, acamprosate.  She is on Tylenol and with her liver function enzymes elevated I am going to stop that.  We will repeat her liver function enzymes in 2 days.  Otherwise she remains stable.  She is seeking residential substance abuse treatment, and social work is aware of that.  We will see what we can come up with.  Otherwise no change in her current medications.  Her anemia is stable.  She has not  had any additional bleeding, and her hemoglobin and hematocrit are stable.  Sharma Covert, MD 01/10/2018, 11:30 AM

## 2018-01-11 ENCOUNTER — Encounter (HOSPITAL_COMMUNITY): Payer: Self-pay | Admitting: Behavioral Health

## 2018-01-11 NOTE — Progress Notes (Signed)
Recreation Therapy Notes  Animal-Assisted Activity (AAA) Program Checklist/Progress Notes Patient Eligibility Criteria Checklist & Daily Group note for Rec Tx Intervention  Date: 6.25.19 Time: 73 Location: [address]   AAA/T Program Assumption of Risk Form signed by Teacher, music or Parent Legal Guardian  YES   Patient is free of allergies or sever asthma  YES  Patient reports no fear of animals YES   Patient reports no history of cruelty to animals YES   Patient understands his/her participation is voluntary YES   Patient washes hands before animal contact YES   Patient washes hands after animal contact YES   Behavioral Response: Engaged  Education: Contractor, Appropriate Animal Interaction   Education Outcome: Acknowledges understanding/In group clarification offered/Needs additional education.   Clinical Observations/Feedback: Pt attended and participated in activity.    Victorino Sparrow, LRT/CTRS         Victorino Sparrow A 01/11/2018 3:58 PM

## 2018-01-11 NOTE — [facility] Group Notes (Signed)
[facility] Mental [facility] Association Group Therapy 01/11/2018 1:15pm  Type of Therapy: Mental Health Association Presentation  Participation Level: Active  Participation Quality: Attentive  Affect: Appropriate  Cognitive: Oriented  Insight: Developing/Improving  Engagement in Therapy: Engaged  Modes of Intervention: Discussion, Education and Socialization  Summary of Progress/Problems: Kristen (Kristen Roberson) Speaker came to talk about his personal journey with mental health. The pt processed ways by which to relate to the speaker. Kristen Roberson speaker provided handouts and educational information pertaining to groups and services offered by the [facility]. Pt was engaged in speaker's presentation and was receptive to resources provided.    Kristen Laine, LCSW 01/11/2018 1:58 PM
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
## 2018-01-11 NOTE — Progress Notes (Signed)
Pt attended goals and orientation group this morning. Pt goal for today is to stay focus.

## 2018-01-11 NOTE — Progress Notes (Addendum)
[facility] MD Progress Note  01/11/2018 12:43 PM Kristen Roberson  MRN:  [id number]  Subjective: " Very excited because when I leave here, I am going to [facility]."  Objective: Patient examined, case discussed with treatment team and chart reviewed.  Patient's 42 year old female with a past psychiatric history significant for major depression, alcohol use disorder/alcohol dependence, and grief issues.  She is seen in follow-up.  She was admitted on 6/23 and since her admission, she endorses decreased depressive symptoms as well as overall improvement in mood and affect. She seems to be very excited that following discharge, she will be admitted to [facility] for additional treatment for her alcoholism and mental health treatment.  She denies any withdrawal symptoms at this time. She denies active or passive SI, HI or AVH and does not appear internally preoccupied. She endorses no concerns with appetite or resting pattern. She continues to endorse some anxiety yet notes overall improvement. No significant emotional difficultness are observed. She is tolerating her medications well without any reported side effects.  At this time,she is contracting for safety on the unit.       Principal Problem: MDD (major depressive disorder), severe ([place]) Diagnosis:   Patient Active Problem List   Diagnosis Date Noted  . Moderate cocaine use disorder ([place]) [F14.20]   . MDD (major depressive disorder), severe ([place]) [F32.2] 01/08/2018  . Suicidal ideation [R45.851]   . Microcytic hypochromic anemia [D50.9] 01/04/2018  . Colitis with rectal bleeding [K52.9, K62.5] 01/03/2018  . MDD (major depressive disorder), recurrent severe, without psychosis ([place]) [F33.2] 10/26/2017  . UTI (urinary tract infection) [N39.0] 09/20/2017  . MDD (major depressive disorder) [F32.9] 01/16/2017  . Alcohol dependence with withdrawal, uncomplicated ([place]) [E36.629] 03/17/2015  . Hepatic encephalopathy ([place]) [K72.90] 02/23/2015  . Seizure ([place])  [R56.9] 02/23/2015  . Pancytopenia ([place]) [U76.546] 02/23/2015  . Internal hemorrhoids [K64.8] 02/23/2015  . Alcohol intoxication ([place]) [F10.929]   . Hemorrhoid [K64.9]   . C. difficile colitis [A04.72]   . Alcohol abuse [F10.10]   . Elevated liver enzymes [R74.8]   . Colitis [K52.9] 12/12/2014  . Abrasion of wrist [S60.819A]   . Alcohol dependence with uncomplicated withdrawal ([place]) [F10.230]   . GAD (generalized anxiety disorder) [F41.1] 07/01/2014  . Alcohol withdrawal syndrome without complication ([place]) [T03.546] 06/30/2014  . MDD (major depressive disorder), recurrent episode, moderate ([place]) [F33.1]   . Alcohol use disorder, severe, dependence ([place]) [F10.20]   . Alcohol dependence with withdrawal with complication ([place]) [F68.127] 05/19/2014  . Thrombocytopenia ([place]) [D69.6] 05/07/2014  . Hypokalemia [E87.6] 05/07/2014  . Right sided weakness [R53.1] 04/15/2014  . Seizures ([place]) [R56.9] 04/04/2014  . Substance induced mood disorder ([place]) [F19.94] 03/26/2014  . Status post myocardial infarction [I25.2] 03/06/2014  . Depression with suicidal ideation [F32.9, R45.851] 03/06/2014  . Polysubstance abuse ([place]) [F19.10] 02/22/2014  . Acute respiratory failure with hypoxia ([place]) [J96.01] 02/22/2014  . Ventricular fibrillation ([place]) [I49.01] 02/22/2014  . Acute systolic heart failure - s/p VF Cardiac Arrest [I50.21] 02/22/2014  . Acute encephalopathy [G93.40] 02/22/2014  . Acute confusional state [F05] 02/22/2014  . Moderate malnutrition ([place]) [E44.0] 02/21/2014  . Convulsions/seizures ([place]) [R56.9] 02/14/2014  . Cardiac arrest ([place]) [I46.9] 02/13/2014  . Alcohol dependence ([place]) [F10.20] 01/06/2014  . Severe alcohol use disorder ([place]) [F10.20] 01/06/2014  . Adjustment disorder with depressed mood [F43.21] 12/14/2013  . 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 Folate deficiency [E53.8] 12/11/2013  . Alcoholism ([place]) [F10.20] 12/11/2013  . Alcohol withdrawal ([place])  [F10.239] 12/11/2013  .  Hypokalemia [E87.6] 12/11/2013  . Hypomagnesemia [E83.42] 12/11/2013  . Malnutrition of moderate degree ([place]) [E44.0] 12/10/2013  . Abdominal pain [R10.9] 11/25/2013  . Mallory-Weiss tear [K22.6] 11/25/2013  . Acute blood loss anemia [D62] 11/24/2013  . Hematemesis [K92.0] 11/23/2013  . Fatty liver [K76.0] 10/29/2013  . Internal hemorrhoids with other complication [I62.7] 03/50/0938  . Hematochezia [K92.1] 10/28/2013  . Normocytic anemia [D64.9] 10/28/2013  . GIB (gastrointestinal bleeding) [K92.2] 10/28/2013  . Anxiety and depression [F41.9, F32.9]   . Post-operative state [Z98.890] 09/29/2013  . Postoperative state [Z98.890] 09/28/2013  . Female pelvic pain [R10.2] 09/21/2013  . Menorrhagia [N92.0] 09/21/2013  . History of ovarian cyst [Z87.42] 09/21/2013  . 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 Dysmenorrhea [N94.6] 09/21/2013   Total Time spent with patient: 20 minutes  Past Psychiatric History: See admission H&P  Past Medical History:  Past Medical History:  Diagnosis Date  . Alcohol abuse   . Anemia   . Anxiety   . Blood transfusion without reported diagnosis   . Cardiac arrest ([place])   . Cysts of both ovaries   . Depression   . Fatty liver 10/05/13  . Proctitis   . Seizures ([place])     Past Surgical History:  Procedure Laterality Date  . APPENDECTOMY    . BIOPSY  01/08/2018   Procedure: BIOPSY;  Surgeon: [name], MD;  Location: [facility];  Service: Gastroenterology;;  . colitis    . COLONOSCOPY N/A 09/30/2013   Procedure: COLONOSCOPY;  Surgeon: Lafayette Dragon, MD;  Location: [facility];  Service: Endoscopy;  Laterality: N/A;  . COLONOSCOPY WITH PROPOFOL N/A 01/08/2018   Procedure: COLONOSCOPY WITH PROPOFOL;  Surgeon: [name], MD;  Location: [facility];  Service: Gastroenterology;  Laterality: N/A;  . ESOPHAGOGASTRODUODENOSCOPY N/A 11/23/2013   Procedure: ESOPHAGOGASTRODUODENOSCOPY (EGD);  Surgeon: Jerene Bears, MD;  [facility];  Service: Endoscopy;  Laterality: N/A;  . LAPAROSCOPIC APPENDECTOMY Right 09/28/2013   Procedure: APPENDECTOMY LAPAROSCOPIC;  Surgeon: Terrance Mass, MD;  Location: [facility];  Service: Gynecology;  Laterality: Right;  . LAPAROSCOPY N/A 09/28/2013   Procedure: LAPAROSCOPY OPERATIVE;  Surgeon: Terrance Mass, MD;  Location: [facility];  Service: Gynecology;  Laterality: N/A;  . LEFT AND RIGHT HEART CATHETERIZATION WITH CORONARY ANGIOGRAM N/A 02/23/2014   Procedure: LEFT AND RIGHT HEART CATHETERIZATION WITH CORONARY ANGIOGRAM;  Surgeon: Leonie Man, MD;  Location: [facility] CATH LAB;  Service: Cardiovascular;  Laterality: N/A;  . OVARIAN CYST REMOVAL    . SALPINGOOPHORECTOMY Right 09/28/2013   Procedure: SALPINGO OOPHORECTOMY;  Surgeon: Terrance Mass, MD;  Location: [facility];  Service: Gynecology;  Laterality: Right;   Family History:  Family History  Problem Relation Age of Onset  . Diabetes Mother   . Hyperlipidemia Mother   . Stroke Mother   . Diabetes Father    Family Psychiatric  History: See admission H&P Social History:  Social History   Substance and Sexual Activity  Alcohol Use Yes   Comment: States she does not drink anymore     Social History   Substance and Sexual Activity  Drug Use No    Social History   Socioeconomic History  . Marital status: Divorced    Spouse name: Not on file  . Number of children: Not on file  . Years of education: Not on file  . Highest education level: Not on file  Occupational History  . Not on file  Social Needs  . Financial resource strain: Not on file  .  Food insecurity:    Worry: Not on file    Inability: Not on file  . Transportation needs:    Medical: Not on file    Non-medical: Not on file  Tobacco Use  . Smoking status: Never Smoker  . Smokeless tobacco: Never Used  Substance and Sexual Activity  . Alcohol use: Yes    Comment: States she does not drink anymore  . Drug use: No  . Sexual activity: Not on file   Lifestyle  . Physical activity:    Days per week: Not on file    Minutes per session: Not on file  . Stress: Not on file  Relationships  . Social connections:    Talks on phone: Not on file    Gets together: Not on file    Attends religious service: Not on file    Active member of club or organization: Not on file    Attends meetings of clubs or organizations: Not on file    Relationship status: Not on file  Other Topics Concern  . Not on file  Social History Narrative  . Not on file   Additional Social History:     Sleep: Fair  Appetite:  Good  Current Medications: Current Facility-Administered Medications  Medication Dose Route Frequency Provider Last Rate Last Dose  . acamprosate (CAMPRAL) tablet 666 mg  666 mg Oral TID WC Money, Lowry Ram, FNP   666 mg at 01/11/18 1215  . acetaminophen (TYLENOL) tablet 650 mg  650 mg Oral Q6H PRN Money, Lowry Ram, FNP   650 mg at 01/09/18 0756  . albuterol (PROVENTIL HFA;VENTOLIN HFA) 108 (90 Base) MCG/ACT inhaler 2 puff  2 puff Inhalation Q6H PRN Patriciaann Clan E, PA-C      . alum & mag hydroxide-simeth (MAALOX/MYLANTA) 200-200-20 MG/5ML suspension 30 mL  30 mL Oral Q4H PRN Money, Lowry Ram, FNP      . FLUoxetine (PROZAC) capsule 40 mg  40 mg Oral Daily Laverle Hobby, PA-C   40 mg at 01/11/18 0816  . hydrOXYzine (ATARAX/VISTARIL) tablet 50 mg  50 mg Oral TID PRN Sharma Covert, MD   50 mg at 01/11/18 0817  . levETIRAcetam (KEPPRA) tablet 1,000 mg  1,000 mg Oral BID Laverle Hobby, PA-C   1,000 mg at 01/11/18 0816  . magnesium hydroxide (MILK OF MAGNESIA) suspension 30 mL  30 mL Oral Daily PRN Money, Lowry Ram, FNP      . traZODone (DESYREL) tablet 100 mg  100 mg Oral QHS PRN Money, Lowry Ram, FNP   100 mg at 01/10/18 2141    Lab Results:  Results for orders placed or performed during the hospital encounter of 01/08/18 (from the past 48 hour(s))  CBC with Differential/Platelet     Status: Abnormal   Collection Time: 01/10/18  6:35  AM  Result Value Ref Range   WBC 3.3 (L) 4.0 - 10.5 K/uL   RBC 4.51 3.87 - 5.11 MIL/uL   Hemoglobin 11.0 (L) 12.0 - 15.0 g/dL   HCT 36.3 36.0 - 46.0 %   MCV 80.5 78.0 - 100.0 fL   MCH 24.4 (L) 26.0 - 34.0 pg   MCHC 30.3 30.0 - 36.0 g/dL   RDW 23.8 (H) 11.5 - 15.5 %   Platelets 175 150 - 400 K/uL   Neutrophils Relative % 47 %   Neutro Abs 1.6 (L) 1.7 - 7.7 K/uL   Lymphocytes Relative 31 %   Lymphs Abs 1.0 0.7 - 4.0 K/uL  Monocytes Relative 17 %   Monocytes Absolute 0.6 0.1 - 1.0 K/uL   Eosinophils Relative 3 %   Eosinophils Absolute 0.1 0.0 - 0.7 K/uL   Basophils Relative 2 %   Basophils Absolute 0.1 0.0 - 0.1 K/uL   RBC Morphology TARGET CELLS     Comment: Performed at [facility], [place] [address] [place], [place] [address]  Hepatic function panel     Status: Abnormal   Collection Time: 01/10/18  6:35 AM  Result Value Ref Range   Total Protein 7.5 6.5 - 8.1 g/dL   Albumin 4.1 3.5 - 5.0 g/dL   AST 156 (H) 15 - 41 U/L   ALT 54 14 - 54 U/L   Alkaline Phosphatase 84 38 - 126 U/L   Total Bilirubin 0.5 0.3 - 1.2 mg/dL   Bilirubin, Direct 0.2 0.1 - 0.5 mg/dL   Indirect Bilirubin 0.3 0.3 - 0.9 mg/dL    Comment: Performed at [facility], [place] [address] [place], [place] [address]    Blood Alcohol level:  Lab Results  Component Value Date   ETH <10 01/03/2018   ETH 295 (H) 84/66/5993    Metabolic Disorder Labs: Lab Results  Component Value Date   HGBA1C 4.4 12/09/2013   MPG 80 12/09/2013   No results found for: PROLACTIN No results found for: CHOL, TRIG, HDL, CHOLHDL, VLDL, LDLCALC  Physical Findings: AIMS: Facial and Oral Movements Muscles of Facial Expression: None, normal Lips and Perioral Area: None, normal Jaw: None, normal Tongue: None, normal,Extremity Movements Upper (arms, wrists, hands, fingers): None, normal Lower (legs, knees, ankles, toes): None, normal, Trunk Movements Neck, shoulders, hips: None, normal,  Overall Severity Severity of abnormal movements (highest score from questions above): None, normal Incapacitation due to abnormal movements: None, normal Patient's awareness of abnormal movements (rate only patient's report): No Awareness, Dental Status Current problems with teeth and/or dentures?: No Does patient usually wear dentures?: No  CIWA:    COWS:     Musculoskeletal: Strength & Muscle Tone: within normal limits Gait & Station: normal Patient leans: N/A  Psychiatric Specialty Exam: Physical Exam  Nursing note and vitals reviewed. Constitutional: She is oriented to person, place, and time. She appears well-developed and well-nourished.  HENT:  Head: Normocephalic and atraumatic.  Respiratory: Effort normal.  Neurological: She is alert and oriented to person, place, and time.    Review of Systems  Psychiatric/Behavioral: Positive for substance abuse. Negative for depression, hallucinations, memory loss and suicidal ideas. The patient is nervous/anxious. The patient does not have insomnia.   All other systems reviewed and are negative.   Blood pressure 105/63, pulse 78, temperature 98.4 F (36.9 C), temperature source Oral, resp. rate 16, height 5\' 6"  (1.676 m), weight 71.2 kg (157 lb), SpO2 100 %.Body mass index is 25.34 kg/m.  General Appearance: Casual  Eye Contact:  Fair  Speech:  Normal Rate  Volume:  Normal  Mood:  Anxious-yet overall improvement   Affect:  Congruent  Thought Process:  Coherent  Orientation:  Full (Time, Place, and Person)  Thought Content:  Logical  Suicidal Thoughts:  No  Homicidal Thoughts:  No  Memory:  Immediate;   Fair Recent;   Fair Remote;   Fair  Judgement:  Intact  Insight:  Lacking  Psychomotor Activity:  Normal  Concentration:  Concentration: Fair and Attention Span: Fair  Recall:  [employer] of Knowledge:  Fair  Language:  Good  Akathisia:  Negative  Handed:  Right  AIMS (if indicated):     Assets:  Communication  Skills Desire for Improvement Physical Health Resilience  ADL's:  Intact  Cognition:  WNL  Sleep:  Number of Hours: 6.75     Treatment Plan Summary: Reviewed current treatment plan. Will continue the following plan without adjustments at this time  Daily contact with patient to assess and evaluate symptoms and progress in treatment,   Medication management and Plan: Patient presents with improvement in mood as well as anxiety. She seems more calm knowing that once discharged, she will be going to [facility]. She denies SI, HI or AVH as well as any withdrawal symptoms.She presents without any seizure activity. Will continue  Fluoxetine 40 mg po daily for depression,  Hydroxyzine 50 mg po TID as needed for anxiety, Keppra 1000 mg po bid for seizure, Trazodone 100 mg po daily as needed for insomnia. Hepatic function panel is in process. AST 156 on 01/10/2018. Plan is for patient to be discharged to [facility] 01/13/2018.     Mordecai Maes, NP 01/11/2018, 12:43 PM     Patient ID: Kristen Roberson, female   DOB: 1975-07-28, 43 y.o.   MRN: [id number] .Marland KitchenAgree with NP Progress Note

## 2018-01-11 NOTE — Progress Notes (Signed)
Pt was observed in the dayroom, attending Kristen Roberson meeting. Pt was animated/anxious in affect and mood. Pt endorses passive SI but verbal contracts for safety. Pt denies HI/AVH/Pain at this time. Pt forwards little with interaction. Pt states she has bad racing thoughts at night. PRN vistaril and trazodone requested and given. Due for A.M. Labs. Will continue with POC.

## 2018-01-12 LAB — HEPATIC FUNCTION PANEL
ALT: 57 U/L — AB (ref 0–44)
AST: 126 U/L — ABNORMAL HIGH (ref 15–41)
Albumin: 3.8 g/dL (ref 3.5–5.0)
Alkaline Phosphatase: 90 U/L (ref 38–126)
BILIRUBIN DIRECT: 0.1 mg/dL (ref 0.0–0.2)
BILIRUBIN INDIRECT: 0.3 mg/dL (ref 0.3–0.9)
Total Bilirubin: 0.4 mg/dL (ref 0.3–1.2)
Total Protein: 6.9 g/dL (ref 6.5–8.1)

## 2018-01-12 MED ORDER — HYDROXYZINE HCL 50 MG PO TABS: 50.0000 mg | ORAL_TABLET | Freq: Three times a day (TID) | ORAL | 0 refills | Status: DC | PRN

## 2018-01-12 MED ORDER — LEVETIRACETAM 1000 MG PO TABS: 1000.0000 mg | ORAL_TABLET | Freq: Two times a day (BID) | ORAL | 0 refills | Status: DC

## 2018-01-12 MED ORDER — ACAMPROSATE CALCIUM 333 MG PO TBEC: 666.0000 mg | DELAYED_RELEASE_TABLET | Freq: Three times a day (TID) | ORAL | 0 refills | Status: DC

## 2018-01-12 MED ORDER — FLUOXETINE HCL 40 MG PO CAPS: 40.0000 mg | ORAL_CAPSULE | Freq: Every day | ORAL | 0 refills | Status: DC

## 2018-01-12 MED ORDER — HYDROCORTISONE 0.5 % EX OINT
TOPICAL_OINTMENT | Freq: Two times a day (BID) | CUTANEOUS | Status: DC
  Administered 2018-01-12: 19:00:00 via TOPICAL
  Filled 2018-01-12 (×2): qty 28.35

## 2018-01-12 MED ORDER — TRAZODONE HCL 100 MG PO TABS: 100.0000 mg | ORAL_TABLET | Freq: Every evening | ORAL | 0 refills | Status: DC | PRN

## 2018-01-12 NOTE — Progress Notes (Signed)
D: Patient will be discharging to [facility] tomorrow morning.  He rates his depression as a 6; hopelessness as a 5; anxiety as an 8.  Her goal today is to "put one foot in front of the other and stay focused."  She is sleeping and eating well; her energy is normal and her concentration is good.  She report minimal withdrawal symptoms.  She denies any thoughts of self harm today.  A: Continue to monitor medication management and MD orders.  Safety checks completed every 15 minutes per protocol.  Offer support and encouragement as needed.  R: Patient is receptive to staff; her behavior is appropriate.

## 2018-01-12 NOTE — Progress Notes (Signed)
  [facility] Adult Case Management Discharge Plan :  Will you be returning to the same living situation after discharge:  No.Pt has screening and possible admission to [facility] At discharge, do you have transportation home?: Yes,  taxi voucher in chart. PATIENT IS SCHEDULED TO DISCHARGE ON THURSDAY AT 7AM IN ORDER TO GET TO [facility] FOR SCREENING/ADMISSION AT 8AM.  Do you have the ability to pay for your medications: Yes,  mental health  Release of information consent forms completed and submitted to medical records by CSW.  Patient to Follow up at: Follow-up Information    [facility] Follow up on 01/14/2018.   Why:  Appointment with Grover Canavan on Friday, 01/14/18 at 9:00AM. Please bring discharge paperwork/medication list to this appt. Thank you.  Contact information: [place] [address] [phone]        [facility] Follow up on 01/13/2018.   Why:  Screening for admission on Thursday at 8:00AM. Please bring: photo ID/proof of [employer] residency, and medications/prescriptions provided by hospital. Thank you.  Contact information: [place] [address] [phone]           Next level of care provider has access to [place] and Suicide Prevention discussed: Yes,  SPE completed with pt; pt declined to consent to collateral contact. SPI pamphlet and Mobile Crisis information provided to pt.   Have you used any form of tobacco in the last 30 days? (Cigarettes, Smokeless Tobacco, Cigars, and/or Pipes): No  Has patient been referred to the Quitline?: N/A patient is not a smoker  Patient has been referred for addiction treatment: Yes  Avelina Laine, LCSW 01/12/2018, 9:00 AM

## 2018-01-12 NOTE — Plan of Care (Signed)
  Problem: Education: Goal: Knowledge of [place] [facility] Education information/materials will improve Outcome: Progressing Goal: Emotional status will improve Outcome: Progressing Goal: Mental status will improve Outcome: Progressing Goal: Verbalization of understanding the information provided will improve Outcome: Progressing   

## 2018-01-12 NOTE — Progress Notes (Signed)
Recreation Therapy Notes  Date: 6.26.19 Time: 0930 Location: 300 Hall Dayroom  Group Topic: Stress Management  Goal Area(s) Addresses:  Patient will verbalize importance of using healthy stress management.  Patient will identify positive emotions associated with healthy stress management.   Behavioral Response: Engaged  Intervention: Stress Management  Activity :  Body Scan Meditation.  LRT introduced the stress management technique of meditation.  LRT played meditation to allow patients to take note of any sensations or tensions they may be feeling.  Patients were to follow along as meditation played.  Education:  Stress Management, Discharge Planning.   Education Outcome: Acknowledges edcuation/In group clarification offered/Needs additional education  Clinical Observations/Feedback: Pt attended and participated in group session.     Victorino Sparrow, LRT/CTRS         Victorino Sparrow A 01/12/2018 12:03 PM

## 2018-01-12 NOTE — Progress Notes (Signed)
Pt was observed in the dayroom, attending NA meeting. Pt was animated/anxious in affect and mood.Pt denies SI/HI/AVH/Pain at this time. Pt forwards little with interaction. Pt states she is ready for discharge to [facility] in the a.m. PRN vistaril and trazodone requested and given. Will continue with POC.

## 2018-01-12 NOTE — Therapy (Signed)
Occupational Therapy Group Treatment Note  Date:  01/12/2018 Time:  11:59 AM  Group Topic/Focus:  Stress Management  Participation Level:  Active  Participation Quality:  Appropriate and Sharing  Affect:  Flat  Cognitive:  Appropriate  Insight: Improving  Engagement in Group:  Engaged  Modes of Intervention:  Activity, Discussion, Education and Socialization  Additional Comments:    S: "I just need to always stay busy, or that's when my thoughts start running"  O: Stress management group completed to use as productive coping strategy, to help mitigate maladaptive coping to integrate in functional BADL/IADL. Stress management tool worksheet discussed to educate on unhealthy vs healthy coping skills to manage stress to improve community integration. Coping strategies taught include: relaxation based- deep breathing, counting to 10, taking a 1 minute vacation, acceptance, stress balls, relaxation audio/video, visual/mental imagery. Positive mental attitude- gratitude, acceptance, cognitive reframing, positive self talk, anger management.   Coloring and relaxation guide handouts given at the end of the session.   A: Pt presents to group with flat affect, appropriately sharing and giving examples with facilitator and other group members. Pt engaged in stress management tools worksheet, offering examples and how to improve current practices. Pt states she often suppresses her emotions or acts out to people verbally. Pt reports she has lost many relationships as a result of this. Pt would like to try relaxation audio and video, deep breathing, and practicing acceptance. Pt eager to acquire handouts at end of OT session.   P: Pt provided with education on stress management activities to implement into daily routine. Handouts given to facilitate carryover when reintegrating into community.     Zenovia Jarred, MSOT, OTR/L   [place] 01/12/2018, 11:59 AM

## 2018-01-12 NOTE — Discharge Summary (Addendum)
Physician Discharge Summary Note  Patient:  Kristen Roberson is an 42 y.o., female MRN:  [id number] DOB:  06-10-1976 Patient phone:  [phone] (home)  Patient address:   [place] [address],  Total Time spent with patient: 30 minutes  Date of Admission:  01/08/2018 Date of Discharge: 01/13/2018  Reason for Admission: SI      Principal Problem: MDD (major depressive disorder), severe [facility]) Discharge Diagnoses: Patient Active Problem List   Diagnosis Date Noted  . Moderate cocaine use disorder ([place]) [F14.20]   . MDD (major depressive disorder), severe ([place]) [F32.2] 01/08/2018  . Suicidal ideation [R45.851]   . Microcytic hypochromic anemia [D50.9] 01/04/2018  . Colitis with rectal bleeding [K52.9, K62.5] 01/03/2018  . MDD (major depressive disorder), recurrent severe, without psychosis ([place]) [F33.2] 10/26/2017  . UTI (urinary tract infection) [N39.0] 09/20/2017  . MDD (major depressive disorder) [F32.9] 01/16/2017  . Alcohol dependence with withdrawal, uncomplicated ([place]) [D62.229] 03/17/2015  . Hepatic encephalopathy ([place]) [K72.90] 02/23/2015  . Seizure ([place]) [R56.9] 02/23/2015  . Pancytopenia ([place]) [N98.921] 02/23/2015  . Internal hemorrhoids [K64.8] 02/23/2015  . Alcohol intoxication ([place]) [F10.929]   . Hemorrhoid [K64.9]   . C. difficile colitis [A04.72]   . Alcohol abuse [F10.10]   . Elevated liver enzymes [R74.8]   . Colitis [K52.9] 12/12/2014  . Abrasion of wrist [S60.819A]   . Alcohol dependence with uncomplicated withdrawal ([place]) [F10.230]   . GAD (generalized anxiety disorder) [F41.1] 07/01/2014  . Alcohol withdrawal syndrome without complication ([place]) [J94.174] 06/30/2014  . MDD (major depressive disorder), recurrent episode, moderate ([place]) [F33.1]   . Alcohol use disorder, severe, dependence ([place]) [F10.20]   . Alcohol dependence with withdrawal with complication ([place]) [Y81.448] 05/19/2014  . Thrombocytopenia ([place]) [D69.6] 05/07/2014  . Hypokalemia  [E87.6] 05/07/2014  . Right sided weakness [R53.1] 04/15/2014  . Seizures ([place]) [R56.9] 04/04/2014  . Substance induced mood disorder ([place]) [F19.94] 03/26/2014  . Status post myocardial infarction [I25.2] 03/06/2014  . Depression with suicidal ideation [F32.9, R45.851] 03/06/2014  . Polysubstance abuse ([place]) [F19.10] 02/22/2014  . Acute respiratory failure with hypoxia ([place]) [J96.01] 02/22/2014  . Ventricular fibrillation ([place]) [I49.01] 02/22/2014  . Acute systolic heart failure - s/p VF Cardiac Arrest [I50.21] 02/22/2014  . Acute encephalopathy [G93.40] 02/22/2014  . Acute confusional state [F05] 02/22/2014  . Moderate malnutrition ([place]) [E44.0] 02/21/2014  . Convulsions/seizures ([place]) [R56.9] 02/14/2014  . Cardiac arrest ([place]) [I46.9] 02/13/2014  . Alcohol dependence ([place]) [F10.20] 01/06/2014  . Severe alcohol use disorder ([place]) [F10.20] 01/06/2014  . Adjustment disorder with depressed mood [F43.21] 12/14/2013  . 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 Folate deficiency [E53.8] 12/11/2013  . Alcoholism ([place]) [F10.20] 12/11/2013  . Alcohol withdrawal ([place]) [F10.239] 12/11/2013  . Hypokalemia [E87.6] 12/11/2013  . Hypomagnesemia [E83.42] 12/11/2013  . Malnutrition of moderate degree ([place]) [E44.0] 12/10/2013  . Abdominal pain [R10.9] 11/25/2013  . Mallory-Weiss tear [K22.6] 11/25/2013  . Acute blood loss anemia [D62] 11/24/2013  . Hematemesis [K92.0] 11/23/2013  . Fatty liver [K76.0] 10/29/2013  . Internal hemorrhoids with other complication [J85.6] 31/49/7026  . Hematochezia [K92.1] 10/28/2013  . Normocytic anemia [D64.9] 10/28/2013  . GIB (gastrointestinal bleeding) [K92.2] 10/28/2013  . Anxiety and depression [F41.9, F32.9]   . Post-operative state [Z98.890] 09/29/2013  . Postoperative state [Z98.890] 09/28/2013  . Female pelvic pain [R10.2] 09/21/2013  . Menorrhagia [N92.0] 09/21/2013  . History of ovarian cyst [Z87.42] 09/21/2013  . Proctitis [K62.89]  09/21/2013  . Dysmenorrhea [N94.6] 09/21/2013    Past Psychiatric History: 1  previous hospitalization, 3 residential rehab facilities, previous suicide attempt    Past Medical History:  Past Medical History:  Diagnosis Date  . Alcohol abuse   . Anemia   . Anxiety   . Blood transfusion without reported diagnosis   . Cardiac arrest ([place])   . Cysts of both ovaries   . Depression   . Fatty liver 10/05/13  . Proctitis   . Seizures ([place])     Past Surgical History:  Procedure Laterality Date  . APPENDECTOMY    . BIOPSY  01/08/2018   Procedure: BIOPSY;  Surgeon: [name], MD;  Location: [facility];  Service: Gastroenterology;;  . colitis    . COLONOSCOPY N/A 09/30/2013   Procedure: COLONOSCOPY;  Surgeon: Lafayette Dragon, MD;  Location: [facility];  Service: Endoscopy;  Laterality: N/A;  . COLONOSCOPY WITH PROPOFOL N/A 01/08/2018   Procedure: COLONOSCOPY WITH PROPOFOL;  Surgeon: [name], MD;  Location: [facility];  Service: Gastroenterology;  Laterality: N/A;  . ESOPHAGOGASTRODUODENOSCOPY N/A 11/23/2013   Procedure: ESOPHAGOGASTRODUODENOSCOPY (EGD);  Surgeon: Jerene Bears, MD;  Location: [facility];  Service: Endoscopy;  Laterality: N/A;  . LAPAROSCOPIC APPENDECTOMY Right 09/28/2013   Procedure: APPENDECTOMY LAPAROSCOPIC;  Surgeon: Terrance Mass, MD;  Location: [facility];  Service: Gynecology;  Laterality: Right;  . LAPAROSCOPY N/A 09/28/2013   Procedure: LAPAROSCOPY OPERATIVE;  Surgeon: Terrance Mass, MD;  Location: [facility];  Service: Gynecology;  Laterality: N/A;  . LEFT AND RIGHT HEART CATHETERIZATION WITH CORONARY ANGIOGRAM N/A 02/23/2014   Procedure: LEFT AND RIGHT HEART CATHETERIZATION WITH CORONARY ANGIOGRAM;  Surgeon: Leonie Man, MD;  Location: [facility] C[facility] LAB;  Service: Cardiovascular;  Laterality: N/A;  . OVARIAN CYST REMOVAL    . SALPINGOOPHORECTOMY Right 09/28/2013   Procedure: SALPINGO OOPHORECTOMY;  Surgeon: Terrance Mass, MD;  Location: [facility];  Service:  Gynecology;  Laterality: Right;   Family History:  Family History  Problem Relation Age of Onset  . Diabetes Mother   . Hyperlipidemia Mother   . Stroke Mother   . Diabetes Father    Family Psychiatric  History: Alcoholism on both sides of the family   Social History:  Social History   Substance and Sexual Activity  Alcohol Use Yes   Comment: States she does not drink anymore     Social History   Substance and Sexual Activity  Drug Use No    Social History   Socioeconomic History  . Marital status: Divorced    Spouse name: Not on file  . Number of children: Not on file  . Years of education: Not on file  . Highest education level: Not on file  Occupational History  . Not on file  Social Needs  . Financial resource strain: Not on file  . Food insecurity:    Worry: Not on file    Inability: Not on file  . Transportation needs:    Medical: Not on file    Non-medical: Not on file  Tobacco Use  . Smoking status: Never Smoker  . Smokeless tobacco: Never Used  Substance and Sexual Activity  . Alcohol use: Yes    Comment: States she does not drink anymore  . Drug use: No  . Sexual activity: Not on file  Lifestyle  . Physical activity:    Days per week: Not on file    Minutes per session: Not on file  . Stress: Not on file  Relationships  . Social connections:    Talks on phone: Not  on file    Gets together: Not on file    Attends religious service: Not on file    Active member of club or organization: Not on file    Attends meetings of clubs or organizations: Not on file    Relationship status: Not on file  Other Topics Concern  . Not on file  Social History Narrative  . Not on file    Hospital Course:  Kristen Roberson was seen yesterday for SI in the setting of multiple stressors including homelessness and loss of her fiances 6 months ago. She denied SI on interview and was future oriented. She was thankful for resources for clothes and shelter resources. She  reported to the SW and nurse later that day "I have no place to live if I go out there I'm just going to kill myself." Psychiatry was reconsulted for this reason.  On interview, Kristen Roberson SIwith a plan to cut her wrists. She shows thisnotewriter her right armthat is bandaged to protect her IVandmentionshow she has a prior suicide attempt by cutting. She reports that she is lonely and she needs people thatare going through the same issues as her to talk to.She also desiresto receive long term treatment for alcohol abuse. She reports that it makes her feel more depressed when she drinks. Her last use was 10 days ago. She denies HI or AVH. She reportspoorsleep and fairappetite  After the above admission assessment and during this hospital course, patients presenting symptoms were identified. Labs were reviewed and on admission to the medical service her liver function enzymes were elevated, but her blood alcohol was less than 10. Her hepatic panel was repeated several times and at the time of discharge, improvement of AST was noted. She was  positive for cocaine.  Colonoscopy showed external hemorrhoids as the most likely source of bleeding. Detoxification protocol inititated as appropriate. Patient was treated and discharged with the following medications; Fluoxetine 40 mg po daily for depression,  Hydroxyzine 50 mg po TID as needed for anxiety, Keppra 1000 mg po bid for seizure, Trazodone 100 mg po daily as needed for insomnia. Fluoxetine 40 mg po daily for depression,  Hydroxyzine 50 mg po TID as needed for anxiety, Keppra 1000 mg po bid for seizure, Trazodone 100 mg po daily as needed for insomnia, Campral 666 mg po TID daily with meals for alcohol dependence  Patient tolerated her treatment regimen without any adverse effects reported. She remained compliant with therapeutic milieu and actively participated in group counseling sessions. AA/NA meetings were offered & held on the unit  with patient active participation. While on the unit, patient was able to verbalize learned coping skills for better management of depression and suicidal thoughts and to better maintain these thoughts and symptoms when returning home.  During the course of her hospitalization, improvement of patients condition was monitored by observation and patients daily report of symptom reduction, presentation of good affect, and overall improvement in mood & behavior.Upon discharge, Kristen Roberson denied any SI/HI, AVH, delusional thoughts, or paranoia. She endorsed overall improvement in symptoms. She denied  any substance withdrawal symptoms.  Prior to discharge, Kristen Roberson's case was presented during treatment team meeting this morning. The team members were all in agreement that she was both mentally & medically stable to be discharged to continue mental health care on an outpatient basis as noted below. She was provided with all the necessary information needed to make this appointment without problems.She was provided with prescriptions  of her [facility]  discharge medications as well as a two week supply of samples. She left [facility] with all personal belongings in no apparent distress. Transportation arrangement by CSW with taxi voucher.  Physical Findings: AIMS: Facial and Oral Movements Muscles of Facial Expression: None, normal Lips and Perioral Area: None, normal Jaw: None, normal Tongue: None, normal,Extremity Movements Upper (arms, wrists, hands, fingers): None, normal Lower (legs, knees, ankles, toes): None, normal, Trunk Movements Neck, shoulders, hips: None, normal, Overall Severity Severity of abnormal movements (highest score from questions above): None, normal Incapacitation due to abnormal movements: None, normal Patient's awareness of abnormal movements (rate only patient's report): No Awareness, Dental Status Current problems with teeth and/or dentures?: No Does patient usually wear dentures?: No  CIWA:     COWS:     Musculoskeletal: Strength & Muscle Tone: within normal limits Gait & Station: normal Patient leans: N/A  Psychiatric Specialty Exam: SEE SRA BY MD  Physical Exam  Nursing note and vitals reviewed. Constitutional: She is oriented to person, place, and time.  Neurological: She is alert and oriented to person, place, and time.    Review of Systems  Psychiatric/Behavioral: Positive for substance abuse. Negative for hallucinations, memory loss and suicidal ideas. Depression: improved. Nervous/anxious: improved. Insomnia: improved.   All other systems reviewed and are negative.   Blood pressure 109/76, pulse 75, temperature 98.2 F (36.8 C), temperature source Oral, resp. rate 16, height 5\' 6"  (1.676 m), weight 71.2 kg (157 lb), SpO2 100 %.Body mass index is 25.34 kg/m.   Have you used any form of tobacco in the last 30 days? (Cigarettes, Smokeless Tobacco, Cigars, and/or Pipes): No  Has this patient used any form of tobacco in the last 30 days? (Cigarettes, Smokeless Tobacco, Cigars, and/or Pipes)  N/A  Blood Alcohol level:  Lab Results  Component Value Date   ETH <10 01/03/2018   ETH 295 (H) 78/58/8502    Metabolic Disorder Labs:  Lab Results  Component Value Date   HGBA1C 4.4 12/09/2013   MPG 80 12/09/2013   No results found for: PROLACTIN No results found for: CHOL, TRIG, HDL, CHOLHDL, VLDL, LDLCALC  See Psychiatric Specialty Exam and Suicide Risk Assessment completed by Attending Physician prior to discharge.  Discharge destination:  Other:  [facility] for screening/admission   Is patient on multiple antipsychotic therapies at discharge:  No   Has Patient had three or more failed trials of antipsychotic monotherapy by history:  No  Recommended Plan for Multiple Antipsychotic Therapies: NA   Allergies as of 01/13/2018      Reactions   Morphine And Related Anaphylaxis    Tolerated hydromorphone, norco and oxycodone    Tramadol Other (See  Comments)   Seizures   Penicillins Other (See Comments)   Unknown childhood reaction. Has patient had a PCN reaction causing immediate rash, facial/tongue/throat swelling, SOB or lightheadedness with hypotension: Unknown Has patient had a PCN reaction causing severe rash involving mucus membranes or skin necrosis: Unknown Has patient had a PCN reaction that required hospitalization unknown Has patient had a PCN reaction occurring within the last 10 years: No If all of the above answers are "NO", then may proceed with Cephalosporin use.      Medication List    TAKE these medications     Indication  acamprosate 333 MG tablet Commonly known as:  CAMPRAL Take 2 tablets (666 mg total) by mouth 3 (three) times daily with meals.  Indication:  Excessive Use of Alcohol   albuterol 108 (90 Base) MCG/ACT  inhaler Commonly known as:  PROVENTIL HFA;VENTOLIN HFA Inhale 2 puffs into the lungs every 6 (six) hours as needed for wheezing or shortness of breath.  Indication:  Asthma   FLUoxetine 40 MG capsule Commonly known as:  PROZAC Take 1 capsule (40 mg total) by mouth daily. What changed:  additional instructions  Indication:  Major Depressive Disorder   hydrocortisone 25 MG suppository Commonly known as:  ANUSOL-HC Place 1 suppository (25 mg total) rectally 2 (two) times daily.  Indication:  Inflamed Hemorrhoids   hydrOXYzine 50 MG tablet Commonly known as:  ATARAX/VISTARIL Take 1 tablet (50 mg total) by mouth 3 (three) times daily as needed for anxiety. What changed:    medication strength  how much to take  how to take this  when to take this  reasons to take this  additional instructions  Indication:  Feeling Anxious   levETIRAcetam 1000 MG tablet Commonly known as:  KEPPRA Take 1 tablet (1,000 mg total) by mouth 2 (two) times daily. What changed:  additional instructions  Indication:  Seizure   traZODone 100 MG tablet Commonly known as:  DESYREL Take 1 tablet (100  mg total) by mouth at bedtime as needed for sleep. What changed:    medication strength  how much to take  Indication:  Trouble Sleeping      Follow-up Information    [facility] Follow up on 01/14/2018.   Why:  Appointment with Grover Canavan on Friday, 01/14/18 at 9:00AM. Please bring discharge paperwork/medication list to this appt. Thank you.  Contact information: [address] [place] [phone]        [facility], [facility] Follow up on 01/13/2018.   Why:  Screening for admission on Thursday at 8:00AM. Please bring: photo ID/proof of [employer] residency, and medications/prescriptions provided by hospital. Thank you.  Contact information: [place] [phone]           Follow-up recommendations: Follow up with your outpatient provided for any medical issues. Activity & diet as recommended by your primary care provider.  Comments:  Patient is instructed prior to discharge to: Take all medications as prescribed by his/her mental healthcare provider. Report any adverse effects and or reactions from the medicines to his/her outpatient provider promptly. Patient has been instructed & cautioned: To not engage in alcohol and or illegal drug use while on prescription medicines. In the event of worsening symptoms, patient is instructed to call the crisis hotline, 911 and or go to the nearest ED for appropriate evaluation and treatment of symptoms. To follow-up with his/her primary care provider for your other medical issues, concerns and or health care needs.  Signed: Mordecai Maes, NP 01/13/2018, 8:32 AM   Patient seen, Suicide Assessment Completed.  Disposition Plan Reviewed
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
## 2018-01-12 NOTE — BHH Group Notes (Signed)
[facility] Group Notes:  (Nursing/MHT/Case Management/Adjunct)  Date:  01/12/2018  Time:  4:00 pm  Type of Therapy:  Psychoeducational Skills  Participation Level:  Active  Participation Quality:  Appropriate  Affect:  Appropriate  Cognitive:  Appropriate  Insight:  Appropriate  Engagement in Group:  Engaged  Modes of Intervention:  Discussion and Education  Summary of Progress/Problems:Patient was alert and participated in group.    Cammy Copa 01/12/2018, 5:53 PM

## 2018-01-13 ENCOUNTER — Encounter (HOSPITAL_COMMUNITY): Payer: Self-pay | Admitting: Behavioral Health

## 2018-01-13 NOTE — Progress Notes (Addendum)
Discharge- Patient verbalizes readiness for discharge: Denies SI/HI, is not psychotic or delusional. A- Discharge instruction read and discussed with parents and patients. All belongings in locker #33 returned to patient R- Patient verbalize understanding of discharge instructions.  Signed for return of belongings. Pt ate breakfast. Escorted to the lobby. Awaiting [facility] pick-up.

## 2018-05-02 ENCOUNTER — Emergency Department (HOSPITAL_COMMUNITY)
Admission: EM | Admit: 2018-05-02 | Discharge: 2018-05-02 | Disposition: A | Discharge status: Home / Self Care | Payer: Self-pay | Attending: Emergency Medicine | Admitting: Emergency Medicine

## 2018-05-02 ENCOUNTER — Emergency Department (HOSPITAL_COMMUNITY): Payer: Self-pay

## 2018-05-02 ENCOUNTER — Encounter (HOSPITAL_COMMUNITY): Payer: Self-pay | Admitting: Emergency Medicine

## 2018-05-02 DIAGNOSIS — S4992XA Unspecified injury of left shoulder and upper arm, initial encounter: Secondary | ICD-10-CM | POA: Insufficient documentation

## 2018-05-02 DIAGNOSIS — M79602 Pain in left arm: Secondary | ICD-10-CM | POA: Insufficient documentation

## 2018-05-02 DIAGNOSIS — Y929 Unspecified place or not applicable: Secondary | ICD-10-CM | POA: Insufficient documentation

## 2018-05-02 DIAGNOSIS — Y939 Activity, unspecified: Secondary | ICD-10-CM | POA: Insufficient documentation

## 2018-05-02 DIAGNOSIS — Y998 Other external cause status: Secondary | ICD-10-CM | POA: Insufficient documentation

## 2018-05-02 DIAGNOSIS — W2209XA Striking against other stationary object, initial encounter: Secondary | ICD-10-CM | POA: Insufficient documentation

## 2018-05-02 MED ORDER — OXYCODONE-ACETAMINOPHEN 5-325 MG PO TABS
1.0000 | ORAL_TABLET | Freq: Once | ORAL | Status: AC
  Administered 2018-05-02: 1 via ORAL
  Filled 2018-05-02: qty 1

## 2018-05-02 MED ORDER — ONDANSETRON 4 MG PO TBDP
4.0000 mg | ORAL_TABLET | Freq: Once | ORAL | Status: AC
  Administered 2018-05-02: 4 mg via ORAL
  Filled 2018-05-02: qty 1

## 2018-05-02 NOTE — ED Notes (Signed)
Name call in ED lobby and check in [place] A x2- no answer. [employer]

## 2018-05-02 NOTE — ED Notes (Signed)
Name call in ED lobby and check in [place] A x1- no answer. [employer]

## 2018-05-02 NOTE — ED Notes (Signed)
Unable to locate pt in A hall or waiting area

## 2018-05-02 NOTE — ED Triage Notes (Addendum)
PT states today she was walking while texting and tripped and fell, injuring the arm. PT screaming loudly and talking loudly. Very enthusiastic about her pain. States her left arm is numb as well.

## 2018-05-02 NOTE — ED Notes (Signed)
Pt is located in A hall
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
## 2018-05-15 ENCOUNTER — Emergency Department (HOSPITAL_COMMUNITY): Payer: Self-pay

## 2018-05-15 ENCOUNTER — Other Ambulatory Visit: Payer: Self-pay

## 2018-05-15 ENCOUNTER — Encounter (HOSPITAL_COMMUNITY): Payer: Self-pay | Admitting: Emergency Medicine

## 2018-05-15 ENCOUNTER — Emergency Department (HOSPITAL_COMMUNITY)
Admission: EM | Admit: 2018-05-15 | Discharge: 2018-05-16 | Disposition: A | Discharge status: Other Acute Inpatient Hospital | Payer: Self-pay | Attending: Emergency Medicine | Admitting: Emergency Medicine

## 2018-05-15 DIAGNOSIS — Z79899 Other long term (current) drug therapy: Secondary | ICD-10-CM | POA: Insufficient documentation

## 2018-05-15 DIAGNOSIS — F1020 Alcohol dependence, uncomplicated: Secondary | ICD-10-CM | POA: Insufficient documentation

## 2018-05-15 DIAGNOSIS — M25522 Pain in left elbow: Secondary | ICD-10-CM | POA: Insufficient documentation

## 2018-05-15 DIAGNOSIS — F329 Major depressive disorder, single episode, unspecified: Secondary | ICD-10-CM

## 2018-05-15 DIAGNOSIS — F332 Major depressive disorder, recurrent severe without psychotic features: Secondary | ICD-10-CM | POA: Insufficient documentation

## 2018-05-15 DIAGNOSIS — F32A Depression, unspecified: Secondary | ICD-10-CM

## 2018-05-15 DIAGNOSIS — R45851 Suicidal ideations: Secondary | ICD-10-CM | POA: Insufficient documentation

## 2018-05-15 LAB — CBC WITH DIFFERENTIAL/PLATELET
BASOS PCT: 1 %
Basophils Absolute: 0 10*3/uL (ref 0.0–0.1)
Eosinophils Absolute: 0 10*3/uL (ref 0.0–0.5)
Eosinophils Relative: 1 %
HCT: 31.4 % — ABNORMAL LOW (ref 36.0–46.0)
Hemoglobin: 8.8 g/dL — ABNORMAL LOW (ref 12.0–15.0)
Lymphocytes Relative: 42 %
Lymphs Abs: 0.9 10*3/uL (ref 0.7–4.0)
MCH: 21.8 pg — ABNORMAL LOW (ref 26.0–34.0)
MCHC: 28 g/dL — AB (ref 30.0–36.0)
MCV: 77.7 fL — ABNORMAL LOW (ref 80.0–100.0)
MONO ABS: 0.5 10*3/uL (ref 0.1–1.0)
Monocytes Relative: 22 %
NEUTROS PCT: 33 %
Neutro Abs: 0.7 10*3/uL — ABNORMAL LOW (ref 1.7–7.7)
Platelets: 164 10*3/uL (ref 150–400)
RBC: 4.04 MIL/uL (ref 3.87–5.11)
RDW: 21.6 % — ABNORMAL HIGH (ref 11.5–15.5)
WBC: 2.4 10*3/uL — AB (ref 4.0–10.5)
nRBC: 0 % (ref 0.0–0.2)

## 2018-05-15 LAB — COMPREHENSIVE METABOLIC PANEL
ALT: 18 U/L (ref 0–44)
ANION GAP: 13 (ref 5–15)
AST: 43 U/L — ABNORMAL HIGH (ref 15–41)
Albumin: 3.7 g/dL (ref 3.5–5.0)
Alkaline Phosphatase: 61 U/L (ref 38–126)
BUN: 5 mg/dL — ABNORMAL LOW (ref 6–20)
CO2: 22 mmol/L (ref 22–32)
Calcium: 8.3 mg/dL — ABNORMAL LOW (ref 8.9–10.3)
Chloride: 106 mmol/L (ref 98–111)
Creatinine, Ser: 0.52 mg/dL (ref 0.44–1.00)
Glucose, Bld: 89 mg/dL (ref 70–99)
POTASSIUM: 3.5 mmol/L (ref 3.5–5.1)
Sodium: 141 mmol/L (ref 135–145)
Total Bilirubin: 0.5 mg/dL (ref 0.3–1.2)
Total Protein: 6.6 g/dL (ref 6.5–8.1)

## 2018-05-15 LAB — ETHANOL: ALCOHOL ETHYL (B): 305 mg/dL — AB (ref <10)

## 2018-05-15 LAB — I-STAT BETA HCG BLOOD, ED (MC, WL, AP ONLY)

## 2018-05-15 LAB — SALICYLATE LEVEL

## 2018-05-15 LAB — ACETAMINOPHEN LEVEL

## 2018-05-15 MED ORDER — THIAMINE HCL 100 MG/ML IJ SOLN: 100.0000 mg | Freq: Every day | INTRAMUSCULAR | Status: DC

## 2018-05-15 MED ORDER — KETOROLAC TROMETHAMINE 15 MG/ML IJ SOLN
15.0000 mg | Freq: Once | INTRAMUSCULAR | Status: AC
  Administered 2018-05-15: 15 mg via INTRAVENOUS
  Filled 2018-05-15: qty 1

## 2018-05-15 MED ORDER — METHOCARBAMOL 500 MG PO TABS
500.0000 mg | ORAL_TABLET | Freq: Once | ORAL | Status: AC
  Administered 2018-05-15: 500 mg via ORAL
  Filled 2018-05-15: qty 1

## 2018-05-15 MED ORDER — LORAZEPAM 1 MG PO TABS: 0.0000 mg | ORAL_TABLET | Freq: Two times a day (BID) | ORAL | Status: DC

## 2018-05-15 MED ORDER — LORAZEPAM 2 MG/ML IJ SOLN: 0.0000 mg | Freq: Two times a day (BID) | INTRAMUSCULAR | Status: DC

## 2018-05-15 MED ORDER — IBUPROFEN 400 MG PO TABS
600.0000 mg | ORAL_TABLET | Freq: Three times a day (TID) | ORAL | Status: DC | PRN
  Administered 2018-05-16: 600 mg via ORAL
  Filled 2018-05-15: qty 1

## 2018-05-15 MED ORDER — ONDANSETRON HCL 4 MG PO TABS: 4.0000 mg | ORAL_TABLET | Freq: Three times a day (TID) | ORAL | Status: DC | PRN

## 2018-05-15 MED ORDER — LORAZEPAM 2 MG/ML IJ SOLN
0.0000 mg | Freq: Four times a day (QID) | INTRAMUSCULAR | Status: DC
  Administered 2018-05-15 – 2018-05-16 (×2): 2 mg via INTRAVENOUS
  Filled 2018-05-15 (×2): qty 1

## 2018-05-15 MED ORDER — HYDROMORPHONE HCL 1 MG/ML IJ SOLN
1.0000 mg | Freq: Once | INTRAMUSCULAR | Status: AC
  Administered 2018-05-15: 1 mg via INTRAVENOUS
  Filled 2018-05-15: qty 1

## 2018-05-15 MED ORDER — ONDANSETRON HCL 4 MG/2ML IJ SOLN
4.0000 mg | Freq: Once | INTRAMUSCULAR | Status: AC
  Administered 2018-05-15: 4 mg via INTRAVENOUS
  Filled 2018-05-15: qty 2

## 2018-05-15 MED ORDER — LORAZEPAM 1 MG PO TABS: 0.0000 mg | ORAL_TABLET | Freq: Four times a day (QID) | ORAL | Status: DC

## 2018-05-15 MED ORDER — NICOTINE 21 MG/24HR TD PT24: 21.0000 mg | MEDICATED_PATCH | Freq: Every day | TRANSDERMAL | Status: DC | PRN

## 2018-05-15 MED ORDER — VITAMIN B-1 100 MG PO TABS: 100.0000 mg | ORAL_TABLET | Freq: Every day | ORAL | Status: DC

## 2018-05-15 NOTE — ED Notes (Signed)
Pt belongings inventoried and placed in locker number 1.
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
## 2018-05-15 NOTE — ED Notes (Signed)
While RN attempting to discharge patient; pt stated "I am in so much pain I wish I could die", I asked her to elaborate, she said "I mean, I wouldn't be upset if I didn't wake up in the morning"

## 2018-05-15 NOTE — Progress Notes (Signed)
Per Patriciaann Clan, PA pt meets criteria for inpt treatment. EDP Quincy Carnes, PA has been advised. Pt tentatively accepted to [facility] pending medical clearance. Pt's BAL 305 on arrival to ED.  Lind Covert, MSW, LCSW Therapeutic Triage Specialist  [phone]

## 2018-05-15 NOTE — ED Notes (Signed)
States she cannot provide a urine sample at this time
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
## 2018-05-15 NOTE — Discharge Instructions (Addendum)
Please wear sling and ACE wrap Apply ice Continue Tylenol/Ibuprofen for pain Call Dr. Lavina Hamman office tommorow

## 2018-05-15 NOTE — ED Notes (Signed)
Patient transported to X-ray 

## 2018-05-15 NOTE — ED Notes (Signed)
Pt in wine colored scrubs, belongings removed from bedside, staffing called for sitter (none available), security called to wand patient
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
## 2018-05-16 LAB — RAPID URINE DRUG SCREEN, HOSP PERFORMED
Amphetamines: NOT DETECTED
BARBITURATES: POSITIVE — AB
BENZODIAZEPINES: POSITIVE — AB
Cocaine: POSITIVE — AB
Opiates: POSITIVE — AB
Tetrahydrocannabinol: NOT DETECTED

## 2018-05-16 LAB — ETHANOL: Alcohol, Ethyl (B): 190 mg/dL — ABNORMAL HIGH (ref <10)

## 2018-05-16 NOTE — ED Notes (Signed)
Pt has not eaten breakfast or lunch trays.

## 2018-05-16 NOTE — Progress Notes (Signed)
Pt. meets criteria for inpatient treatment per Elmarie Shiley, PMHNP.  Referred out to the following hospitals: [facility]  [facility]     Disposition CSW will continue to follow for placement.  Areatha Keas. Judi Cong, MSW, [facility] Disposition Clinical Social Work [phone] (cell) [phone] (office)

## 2018-05-16 NOTE — ED Notes (Signed)
Notified [facility] transportation of patient with ready bed at [facility]. States they will be here ASAP.

## 2018-05-16 NOTE — BH Assessment (Signed)
[facility] Assessment Progress Note    Vikki Ports [phone]) from [facility] called and indicated that patient has been accepted to their facility by Dr. Sarajane Marek.  Patient is assigned to Room 708 and can come at anytime.  RN report will need to be called to [phone].
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
## 2018-05-17 LAB — PATHOLOGIST SMEAR REVIEW

## 2018-06-22 ENCOUNTER — Encounter (HOSPITAL_BASED_OUTPATIENT_CLINIC_OR_DEPARTMENT_OTHER): Payer: Self-pay | Admitting: *Deleted

## 2018-06-22 ENCOUNTER — Other Ambulatory Visit: Payer: Self-pay

## 2018-06-22 ENCOUNTER — Emergency Department (HOSPITAL_BASED_OUTPATIENT_CLINIC_OR_DEPARTMENT_OTHER)
Admission: EM | Admit: 2018-06-22 | Discharge: 2018-06-22 | Disposition: A | Discharge status: Home / Self Care | Payer: Self-pay | Attending: Emergency Medicine | Admitting: Emergency Medicine

## 2018-06-22 ENCOUNTER — Emergency Department (HOSPITAL_BASED_OUTPATIENT_CLINIC_OR_DEPARTMENT_OTHER): Payer: Self-pay

## 2018-06-22 DIAGNOSIS — G8929 Other chronic pain: Secondary | ICD-10-CM | POA: Insufficient documentation

## 2018-06-22 DIAGNOSIS — F329 Major depressive disorder, single episode, unspecified: Secondary | ICD-10-CM | POA: Insufficient documentation

## 2018-06-22 DIAGNOSIS — I5020 Unspecified systolic (congestive) heart failure: Secondary | ICD-10-CM | POA: Insufficient documentation

## 2018-06-22 DIAGNOSIS — M79602 Pain in left arm: Secondary | ICD-10-CM | POA: Insufficient documentation

## 2018-06-22 DIAGNOSIS — F1420 Cocaine dependence, uncomplicated: Secondary | ICD-10-CM | POA: Insufficient documentation

## 2018-06-22 DIAGNOSIS — F1010 Alcohol abuse, uncomplicated: Secondary | ICD-10-CM | POA: Insufficient documentation

## 2018-06-22 DIAGNOSIS — Z79899 Other long term (current) drug therapy: Secondary | ICD-10-CM | POA: Insufficient documentation

## 2018-06-22 DIAGNOSIS — F419 Anxiety disorder, unspecified: Secondary | ICD-10-CM | POA: Insufficient documentation

## 2018-06-22 MED ORDER — ACETAMINOPHEN 325 MG PO TABS
650.0000 mg | ORAL_TABLET | Freq: Once | ORAL | Status: AC
  Administered 2018-06-22: 650 mg via ORAL
  Filled 2018-06-22: qty 2

## 2018-06-22 MED ORDER — METHOCARBAMOL 1000 MG/10ML IJ SOLN
1000.0000 mg | Freq: Once | INTRAMUSCULAR | Status: DC
  Filled 2018-06-22: qty 10

## 2018-06-22 MED ORDER — GABAPENTIN 300 MG PO CAPS: 300.0000 mg | ORAL_CAPSULE | Freq: Every evening | ORAL | 0 refills | Status: AC | PRN

## 2018-06-22 MED ORDER — LIDOCAINE 5 % EX PTCH
1.0000 | MEDICATED_PATCH | CUTANEOUS | Status: DC
  Filled 2018-06-22: qty 1

## 2018-06-22 MED ORDER — LEVETIRACETAM 1000 MG PO TABS: 1000.0000 mg | ORAL_TABLET | Freq: Two times a day (BID) | ORAL | 0 refills | Status: AC

## 2018-06-22 MED ORDER — METHOCARBAMOL 500 MG PO TABS: 500.0000 mg | ORAL_TABLET | Freq: Three times a day (TID) | ORAL | 0 refills | Status: AC | PRN

## 2018-06-22 MED ORDER — METHOCARBAMOL 1000 MG/10ML IJ SOLN
500.0000 mg | Freq: Once | INTRAMUSCULAR | Status: AC
  Administered 2018-06-22: 500 mg via INTRAMUSCULAR
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
## 2018-06-22 NOTE — ED Notes (Signed)
ED Provider at bedside. 

## 2018-06-22 NOTE — ED Triage Notes (Signed)
Pt dislocated left shoulder about 8 weeks ago.  Pt states that at 3 am this am she heard a pop in her left shoulder and she now has severe pain from neck down to left shoulder and into elbow and wrist.  Pt reports changes in sensation to left hand which feels like "it's in a bucket of ice" and numbness in middle, index, and thumb.

## 2018-06-22 NOTE — ED Notes (Addendum)
Pt c/o Left shoulder pain for several days that worsened last night  Pain is worse with movement. Patient grimaced when hand touched. Assisted pt to remove shirt.  She state she has numbness in arm and hand.  Pt states she took 400 mg of ibuprofen at 0745 today. Pt unable to rotate hand without pain.

## 2018-06-22 NOTE — ED Provider Notes (Signed)
[facility] EMERGENCY DEPARTMENT Provider Note   CSN: [id number] Arrival date & time: 06/22/18  1056     History   Chief Complaint Chief Complaint  Patient presents with  . Shoulder Pain    HPI Kristen Roberson is a 42 y.o. female with a hx of  EtOH abuse, seizures, CHF s/p VF cardiac arrest, substance induced mood disorder, and substance abuse who presents to the ED with complaints of acute on chronic LUE pain x 3 days. Patient states she is having pain in the L shoulder, elbow, and wrist. She states pain is constant with intermittent increases of pain that feel like electric shocks. She states she felt a popping sensation in the shoulder and elbow sometime this AM which prompted ER visit. Pain is a 10/10 in severity, worse with movement, no alleviating factors, tried ibuprofen prior to arrival. Reports paresthesias & weakness to the 1st-3rd digits.  All sxs have occurred previously, she attributes sxs to a fall during which she dislocated her L shoulder and had multiple fractures in the LUE. Followed by Dr. Linton Rump. She recently ran out of her gabapentin and also is requesting refill of keprra.  She denies recent traumatic injury or change in activity. Denies chest pain, dyspnea, emesis, dizziness, lightheadedness, or syncope.   HPI  Past Medical History:  Diagnosis Date  . Alcohol abuse   . Anemia   . Anxiety   . Blood transfusion without reported diagnosis   . Cardiac arrest ([place])   . Cysts of both ovaries   . Depression   . Fatty liver 10/05/13  . Proctitis   . Seizures [facility])     Patient Active Problem List   Diagnosis Date Noted  . Moderate cocaine use disorder ([place])   . MDD (major depressive disorder), severe ([place]) 01/08/2018  . Suicidal ideation   . Microcytic hypochromic anemia 01/04/2018  . Colitis with rectal bleeding 01/03/2018  . MDD (major depressive disorder), recurrent severe, without psychosis ([place]) 10/26/2017  . UTI (urinary tract infection) 09/20/2017    . MDD (major depressive disorder) 01/16/2017  . Alcohol dependence with withdrawal, uncomplicated ([place]) 40/98/1191  . Hepatic encephalopathy ([place]) 02/23/2015  . Seizure ([place]) 02/23/2015  . Pancytopenia ([place]) 02/23/2015  . Internal hemorrhoids 02/23/2015  . Hemorrhoid   . C. difficile colitis   . Alcohol abuse   . Elevated liver enzymes   . Colitis 12/12/2014  . GAD (generalized anxiety disorder) 07/01/2014  . MDD (major depressive disorder), recurrent episode, moderate ([place])   . Alcohol use disorder, severe, dependence ([place])   . Thrombocytopenia ([place]) 05/07/2014  . Hypokalemia 05/07/2014  . Right sided weakness 04/15/2014  . Seizures ([place]) 04/04/2014  . Substance induced mood disorder ([place]) 03/26/2014  . Status post myocardial infarction 03/06/2014  . Depression with suicidal ideation 03/06/2014  . Polysubstance abuse ([place]) 02/22/2014  . Acute respiratory failure with hypoxia ([place]) 02/22/2014  . Ventricular fibrillation ([place]) 02/22/2014  . Acute systolic heart failure - s/p VF Cardiac Arrest 02/22/2014  . Acute encephalopathy 02/22/2014  . Acute confusional state 02/22/2014  . Convulsions/seizures ([place]) 02/14/2014  . Cardiac arrest ([place]) 02/13/2014  . Severe alcohol use disorder ([place]) 01/06/2014  . Adjustment disorder with depressed mood 12/14/2013  . Alcoholic peripheral neuropathy ([place]) 12/11/2013  . Folate deficiency 12/11/2013  . Alcoholism ([place]) 12/11/2013  . Hypokalemia 12/11/2013  . Hypomagnesemia 12/11/2013  . Malnutrition of moderate degree ([place]) 12/10/2013  . Mallory-Weiss tear 11/25/2013  . Hematemesis 11/23/2013  . Fatty liver 10/29/2013  .  Internal hemorrhoids with other complication 27/25/3664  . Hematochezia 10/28/2013  . Normocytic anemia 10/28/2013  . GIB (gastrointestinal bleeding) 10/28/2013  . Anxiety and depression   . Female pelvic pain 09/21/2013  . Menorrhagia 09/21/2013  . History of ovarian cyst 09/21/2013  . Proctitis 09/21/2013  .  Dysmenorrhea 09/21/2013    Past Surgical History:  Procedure Laterality Date  . APPENDECTOMY    . BIOPSY  01/08/2018   Procedure: BIOPSY;  Surgeon: [name], MD;  Location: [facility];  Service: Gastroenterology;;  . colitis    . COLONOSCOPY N/A 09/30/2013   Procedure: COLONOSCOPY;  Surgeon: Lafayette Dragon, MD;  Location: [facility];  Service: Endoscopy;  Laterality: N/A;  . COLONOSCOPY WITH PROPOFOL N/A 01/08/2018   Procedure: COLONOSCOPY WITH PROPOFOL;  Surgeon: [name], MD;  Location: [facility];  Service: Gastroenterology;  Laterality: N/A;  . ESOPHAGOGASTRODUODENOSCOPY N/A 11/23/2013   Procedure: ESOPHAGOGASTRODUODENOSCOPY (EGD);  Surgeon: Jerene Bears, MD;  Location: [facility];  Service: Endoscopy;  Laterality: N/A;  . LAPAROSCOPIC APPENDECTOMY Right 09/28/2013   Procedure: APPENDECTOMY LAPAROSCOPIC;  Surgeon: Terrance Mass, MD;  Location: [facility];  Service: Gynecology;  Laterality: Right;  . LAPAROSCOPY N/A 09/28/2013   Procedure: LAPAROSCOPY OPERATIVE;  Surgeon: Terrance Mass, MD;  Location: [facility];  Service: Gynecology;  Laterality: N/A;  . LEFT AND RIGHT HEART CATHETERIZATION WITH CORONARY ANGIOGRAM N/A 02/23/2014   Procedure: LEFT AND RIGHT HEART CATHETERIZATION WITH CORONARY ANGIOGRAM;  Surgeon: Leonie Man, MD;  Location: [facility] CATH LAB;  Service: Cardiovascular;  Laterality: N/A;  . OVARIAN CYST REMOVAL    . SALPINGOOPHORECTOMY Right 09/28/2013   Procedure: SALPINGO OOPHORECTOMY;  Surgeon: Terrance Mass, MD;  Location: [facility];  Service: Gynecology;  Laterality: Right;     OB History    Gravida  7   Para  3   Term      Preterm      AB  4   Living  3     SAB  4   TAB      Ectopic      Multiple      Live Births               Home Medications    Prior to Admission medications   Medication Sig Start Date End Date Taking? Authorizing Provider  acamprosate (CAMPRAL) 333 MG tablet Take 2 tablets (666 mg total) by mouth 3 (three) times  daily with meals. Patient not taking: Reported on 05/15/2018 01/12/18   Mordecai Maes, NP  acetaminophen (TYLENOL) 325 MG tablet Take 650 mg by mouth every 6 (six) hours as needed (pain).    [provider]  albuterol (PROVENTIL HFA;VENTOLIN HFA) 108 (90 Base) MCG/ACT inhaler Inhale 2 puffs into the lungs every 6 (six) hours as needed for wheezing or shortness of breath.    [provider]  FLUoxetine (PROZAC) 20 MG capsule Take 40 mg by mouth daily.     [provider]  FLUoxetine (PROZAC) 40 MG capsule Take 1 capsule (40 mg total) by mouth daily. Patient not taking: Reported on 05/16/2018 01/13/18   Mordecai Maes, NP  gabapentin (NEURONTIN) 300 MG capsule Take 300 mg by mouth at bedtime as needed (nerve pain).  04/11/18   [provider]  hydrocortisone (ANUSOL-HC) 25 MG suppository Place 1 suppository (25 mg total) rectally 2 (two) times daily. Patient not taking: Reported on 05/15/2018 01/08/18   Lavina Hamman, MD  hydrOXYzine (ATARAX/VISTARIL) 50 MG tablet Take 1  tablet (50 mg total) by mouth 3 (three) times daily as needed for anxiety. Patient not taking: Reported on 05/15/2018 01/12/18   Mordecai Maes, NP  ibuprofen (ADVIL,MOTRIN) 200 MG tablet Take 400 mg by mouth every 6 (six) hours as needed (pain).    [provider]  levETIRAcetam (KEPPRA) 1000 MG tablet Take 1 tablet (1,000 mg total) by mouth 2 (two) times daily. 01/12/18   Mordecai Maes, NP  QUEtiapine Fumarate (SEROQUEL PO) Take 1 tablet by mouth at bedtime as needed (sleep).    [provider]  traZODone (DESYREL) 100 MG tablet Take 1 tablet (100 mg total) by mouth at bedtime as needed for sleep. Patient not taking: Reported on 05/15/2018 01/12/18   Mordecai Maes, NP    Family History Family History  Problem Relation Age of Onset  . Diabetes Mother   . Hyperlipidemia Mother   . Stroke Mother   . Diabetes Father     Social History Social History   Tobacco  Use  . Smoking status: Never Smoker  . Smokeless tobacco: Never Used  Substance Use Topics  . Alcohol use: Yes    Comment: States she does not drink anymore  . Drug use: No     Allergies   Morphine and related; Tramadol; and Penicillins   Review of Systems Review of Systems  Constitutional: Negative for chills and fever.  Respiratory: Negative for shortness of breath.   Cardiovascular: Negative for chest pain.  Gastrointestinal: Negative for vomiting.  Musculoskeletal: Positive for arthralgias (L shoulder, elbow, wrist).  Neurological: Positive for weakness (L 1st-3rd fingers, chronic) and numbness (L 1st-3rd fingers, chronic).     Physical Exam Updated Vital Signs BP 131/90 (BP Location: Right Arm)   Pulse 90   Temp 98 F (36.7 C) (Oral)   Resp 18   Ht 5\' 6"  (1.676 m)   Wt 71.7 kg   SpO2 100%   BMI 25.50 kg/m   Physical Exam  Constitutional: She appears well-developed and well-nourished.  Non-toxic appearance. No distress.  HENT:  Head: Normocephalic and atraumatic.  Eyes: Conjunctivae are normal. Right eye exhibits no discharge. Left eye exhibits no discharge.  Cardiovascular: Normal rate and regular rhythm.  Pulses:      Radial pulses are 2+ on the right side, and 2+ on the left side.  Pulmonary/Chest: Effort normal and breath sounds normal. No respiratory distress.  Musculoskeletal:  No obvious deformity, appreciable swelling, erythema, ecchymosis, open wounds, rashes, or increased warmth.  Neck: No midline cervical spine tenderness. No palpable step off. Tender over L paraspinal muscles including trapezius.  Upper extremities: Patient able to fully extend to the L shoulder, she is able to flex to approximately 90 degrees and then discontinues secondary to pain, refused PROM attempt. She is able to fully extend the elbow, however can only flex to just past 90 degrees. Pronation intact. Unable to perform supination secondary to pain. Has full active  extension/flexion intact at the wrist. Able to fully flexion and extend all MCP/IP joints with the exception of the 2nd digit IP joints being mildly limited in flexion. She is tender fairly diffusely throughout the entire LUE. Seems to be more so at the joints and the 2nd digit of the hand. There does not appear to be any point/focal tenderness over a specific bony landmark.   Neurological: She is alert.  Clear speech. Sensation grossly intact, however patient reports feels different in LUE digits 1-3. Grip strength weaker w/ LUE compared to RUE specifically in digits  1-3. Symmetric strength with ankle plantar/dorsiflexion. Ambulatory with steady gait.   Psychiatric: She has a normal mood and affect. Her behavior is normal. Thought content normal.  Nursing note and vitals reviewed.    ED Treatments / Results  Labs (all labs ordered are listed, but only abnormal results are displayed) Labs Reviewed - No data to display  EKG None  Radiology Dg Elbow Complete Left  Result Date: 06/22/2018 CLINICAL DATA:  42 year old who sustained a LEFT shoulder dislocation approximately 2 months ago and a LEFT elbow dislocation approximately 3 months ago, presenting with an audible pop in her LEFT shoulder at 3 o'clock this morning resulting in severe pain involving the LEFT shoulder radiating into the LEFT elbow and LEFT wrist. Patient also describes paresthesias and numbness in the LEFT hand. No discrete injury. EXAM: LEFT ELBOW - COMPLETE 3+ VIEW COMPARISON:  05/15/2018, 05/02/2018 and earlier. FINDINGS: No evidence of acute fracture or dislocation. Remote avulsion fracture fragment adjacent to the MEDIAL epicondyle of the humerus, unchanged. Well-preserved joint spaces. Well-preserved bone mineral density. No POSTERIOR fat pad to indicate a joint effusion or hemarthrosis. IMPRESSION: 1. No acute osseous abnormality. 2. Remote avulsion fracture fragment adjacent to the MEDIAL epicondyle of the humerus.  Electronically Signed   By: Evangeline Dakin M.D.   On: 06/22/2018 12:49   Dg Wrist Complete Left  Result Date: 06/22/2018 CLINICAL DATA:  42 year old who sustained a LEFT shoulder dislocation approximately 2 months ago, presenting with an audible pop in her LEFT shoulder at 3 o'clock this morning resulting in severe pain involving the LEFT shoulder radiating into the LEFT elbow and LEFT wrist. Patient also describes paresthesias and numbness in the LEFT hand. No discrete injury. EXAM: LEFT WRIST - COMPLETE 3+ VIEW COMPARISON:  05/02/2018. FINDINGS: No evidence of acute fracture or dislocation. Joint spaces well preserved. Well-preserved bone mineral density. No intrinsic osseous abnormalities. IMPRESSION: Normal examination. Electronically Signed   By: Evangeline Dakin M.D.   On: 06/22/2018 12:51   Dg Shoulder Left  Result Date: 06/22/2018 CLINICAL DATA:  Pain EXAM: LEFT SHOULDER - 2+ VIEW COMPARISON:  None. FINDINGS: No acute fracture. No dislocation.  Unremarkable soft tissues. IMPRESSION: No acute bony pathology. Electronically Signed   By: Marybelle Killings M.D.   On: 06/22/2018 12:38   Dg Hand Complete Left  Result Date: 06/22/2018 CLINICAL DATA:  42 year old who sustained a LEFT shoulder dislocation approximately 2 months ago, presenting with an audible pop in her LEFT shoulder at 3 o'clock this morning resulting in severe pain involving the LEFT shoulder radiating into the LEFT elbow and LEFT wrist. Patient also describes paresthesias and numbness in the LEFT hand. No discrete injury. EXAM: LEFT HAND - COMPLETE 3+ VIEW COMPARISON:  01/29/2014. FINDINGS: No evidence of acute fracture or dislocation. Joint spaces well preserved. Well-preserved bone mineral density. No intrinsic osseous abnormalities. IMPRESSION: Normal examination. Electronically Signed   By: Evangeline Dakin M.D.   On: 06/22/2018 12:52    Procedures Procedures (including critical care time)  Medications Ordered in ED Medications   acetaminophen (TYLENOL) tablet 650 mg (650 mg Oral Given 06/22/18 1237)  methocarbamol (ROBAXIN) injection 500 mg (500 mg Intramuscular Given 06/22/18 1321)     Initial Impression / Assessment and Plan / ED Course  I have reviewed the triage vital signs and the nursing notes.  Pertinent labs & imaging results that were available during my care of the patient were reviewed by me and considered in my medical decision making (see chart for details).  Patient presents with acute on chronic LUE pain with paresthesias/weakness to 1st-3rd fingers. Patient nontoxic appearing, in no apparent distress, vitals WNL.  Exam without overlying erythema/warmth or fever to suggest infectious process. She is diffusely tender without point/focal bony landmark tenderness. Xrays obtained in areas of most significant pain are negative for acute osseous process, there is a remote avulsion fracture fragment adjacent to the medial epicondyle of the humerus- consistent with patient reported prior injury. Symmetric pulses with good cap refill. Her decreased strength/sensation to digits 1-3 do not seem acute based on chart review. This is patient's 5th ER visit in the past 3 months for similar complaint s/p injury 09/01. Given repeat similar visits, reproducible with palpation, doubt acute underlying cardiopulmonary etiology such as ACS/PE. She is followed by Dr. Linton Rump. There does not seem to be any new findings today. We will have her follow up with him in office. Refill of keppra and gabapentin given, also provided prescription for robaxin to help with pain, discussed no driving or operating heavy machinery w/ this medicine. NSAIDs avoided given prior GI bleed on chart review. We also discussed importance of shoulder mobility to avoid adhesive capsulitis. I discussed results, treatment plan, need for follow-up, and return precautions with the patient. Provided opportunity for questions, patient confirmed understanding and is in  agreement with plan.    Final Clinical Impressions(s) / ED Diagnoses   Final diagnoses:  Chronic pain of left upper extremity    ED Discharge Orders         Ordered    levETIRAcetam (KEPPRA) 1000 MG tablet  2 times daily     06/22/18 1308    gabapentin (NEURONTIN) 300 MG capsule  At bedtime PRN     06/22/18 1308    methocarbamol (ROBAXIN) 500 MG tablet  Every 8 hours PRN     06/22/18 1308           Ruthanna Macchia, [place], PA-C 06/22/18 1942    Isla Pence, MD 06/23/18 [phone]

## 2018-06-22 NOTE — ED Notes (Signed)
Patient transported to X-ray 

## 2023-05-24 ENCOUNTER — Telehealth: Payer: Self-pay

## 2023-05-24 NOTE — Transitions of Care (Post Inpatient/ED Visit) (Signed)
   05/24/2023  Name: LISAMARIE COKE MRN: [id number] DOB: 12-27-1975  Today's TOC FU Call Status: Today's TOC FU Call Status:: Unsuccessful Call (1st Attempt) Unsuccessful Call (1st Attempt) Date: 05/24/23  Attempted to reach the patient regarding the most recent Inpatient/ED visit.  Follow Up Plan: Additional outreach attempts will be made to reach the patient to complete the Transitions of Care (Post Inpatient/ED visit) call.   Alyse Low, RN, BA, [facility], CRRN [facility] Coordinator, Transition of Care Ph # [phone]

## 2023-05-25 ENCOUNTER — Telehealth: Payer: Self-pay

## 2023-05-25 NOTE — Transitions of Care (Post Inpatient/ED Visit) (Signed)
   05/25/2023  Name: AROUSH CHASSE MRN: [id number] DOB: 1976/06/25  Today's TOC FU Call Status: Today's TOC FU Call Status:: Unsuccessful Call (2nd Attempt) Unsuccessful Call (2nd Attempt) Date: 05/25/23  Attempted to reach the patient regarding the most recent Inpatient/ED visit.  Follow Up Plan: Additional outreach attempts will be made to reach the patient to complete the Transitions of Care (Post Inpatient/ED visit) call.   Alyse Low, RN, BA, [facility], CRRN [facility] Coordinator, Transition of Care Ph # [phone]

## 2023-05-26 ENCOUNTER — Telehealth: Payer: Self-pay

## 2023-05-26 NOTE — Transitions of Care (Post Inpatient/ED Visit) (Signed)
   05/26/2023  Name: HARUMI YAMIN MRN: [id number] DOB: June 13, 1976  Today's TOC FU Call Status: Today's TOC FU Call Status:: Unsuccessful Call (3rd Attempt) Unsuccessful Call (3rd Attempt) Date: 05/26/23  Attempted to reach the patient regarding the most recent Inpatient/ED visit.  Follow Up Plan: No further outreach attempts will be made at this time. We have been unable to contact the patient.  Alyse Low, RN, BA, [facility], CRRN [facility] Coordinator, Transition of Care Ph # [phone]
